# Patient Record
Sex: Male | Born: 1955
Health system: Southern US, Community
[De-identification: ages and names within clinical notes are randomized; demographics above are authoritative.]

## PROBLEM LIST (undated history)

## (undated) DIAGNOSIS — C189 Malignant neoplasm of colon, unspecified: Secondary | ICD-10-CM

## (undated) DIAGNOSIS — F4310 Post-traumatic stress disorder, unspecified: Secondary | ICD-10-CM

## (undated) DIAGNOSIS — K7460 Unspecified cirrhosis of liver: Secondary | ICD-10-CM

## (undated) DIAGNOSIS — I10 Essential (primary) hypertension: Secondary | ICD-10-CM

## (undated) DIAGNOSIS — Z72820 Sleep deprivation: Secondary | ICD-10-CM

## (undated) DIAGNOSIS — M1990 Unspecified osteoarthritis, unspecified site: Secondary | ICD-10-CM

## (undated) DIAGNOSIS — R5383 Other fatigue: Secondary | ICD-10-CM

## (undated) DIAGNOSIS — F1911 Other psychoactive substance abuse, in remission: Secondary | ICD-10-CM

## (undated) DIAGNOSIS — E1142 Type 2 diabetes mellitus with diabetic polyneuropathy: Secondary | ICD-10-CM

## (undated) DIAGNOSIS — K603 Anal fistula, unspecified: Secondary | ICD-10-CM

## (undated) DIAGNOSIS — E119 Type 2 diabetes mellitus without complications: Secondary | ICD-10-CM

## (undated) DIAGNOSIS — R05 Cough: Secondary | ICD-10-CM

## (undated) DIAGNOSIS — K7030 Alcoholic cirrhosis of liver without ascites: Secondary | ICD-10-CM

## (undated) DIAGNOSIS — K612 Anorectal abscess: Secondary | ICD-10-CM

## (undated) HISTORY — DX: Other psychoactive substance abuse, in remission: F19.11

## (undated) HISTORY — DX: Sleep deprivation: Z72.820

## (undated) HISTORY — PX: ABSCESS DRAINAGE: SHX1119

## (undated) HISTORY — DX: Malignant neoplasm of colon, unspecified: C18.9

## (undated) HISTORY — DX: Anorectal abscess: K61.2

## (undated) HISTORY — DX: Other fatigue: R53.83

## (undated) HISTORY — DX: Unspecified cirrhosis of liver: K74.60

## (undated) HISTORY — PX: PERCUTANEOUS PINNING PHALANX FRACTURE OF HAND: SUR1027

## (undated) HISTORY — DX: Anal fistula, unspecified: K60.30

## (undated) HISTORY — DX: Anal fistula: K60.3

---

## 1985-01-07 HISTORY — PX: LUMBAR DISC SURGERY: SHX700

## 1988-09-07 HISTORY — PX: THORACIC DISCECTOMY: SHX100

## 1998-08-30 ENCOUNTER — Ambulatory Visit (HOSPITAL_COMMUNITY): Admission: RE | Admit: 1998-08-30 | Discharge: 1998-08-30 | Payer: Self-pay | Admitting: Cardiology

## 1998-12-13 ENCOUNTER — Ambulatory Visit (HOSPITAL_BASED_OUTPATIENT_CLINIC_OR_DEPARTMENT_OTHER): Admission: RE | Admit: 1998-12-13 | Discharge: 1998-12-13 | Payer: Self-pay | Admitting: General Surgery

## 1998-12-13 ENCOUNTER — Encounter (INDEPENDENT_AMBULATORY_CARE_PROVIDER_SITE_OTHER): Payer: Self-pay | Admitting: Specialist

## 1998-12-13 HISTORY — PX: ANAL FISTULECTOMY: SHX1139

## 1999-01-08 HISTORY — PX: CORONARY ANGIOPLASTY: SHX604

## 2001-06-09 ENCOUNTER — Emergency Department (HOSPITAL_COMMUNITY): Admission: EM | Admit: 2001-06-09 | Discharge: 2001-06-09 | Payer: Self-pay | Admitting: Emergency Medicine

## 2001-07-14 ENCOUNTER — Encounter (INDEPENDENT_AMBULATORY_CARE_PROVIDER_SITE_OTHER): Payer: Self-pay | Admitting: *Deleted

## 2001-07-14 ENCOUNTER — Ambulatory Visit (HOSPITAL_BASED_OUTPATIENT_CLINIC_OR_DEPARTMENT_OTHER): Admission: RE | Admit: 2001-07-14 | Discharge: 2001-07-14 | Payer: Self-pay | Admitting: Surgery

## 2001-07-14 HISTORY — PX: INCISION AND DRAINAGE PERIRECTAL ABSCESS: SHX1804

## 2001-07-22 ENCOUNTER — Encounter: Admission: RE | Admit: 2001-07-22 | Discharge: 2001-07-22 | Payer: Self-pay | Admitting: Internal Medicine

## 2001-07-22 ENCOUNTER — Encounter: Payer: Self-pay | Admitting: Internal Medicine

## 2002-06-22 ENCOUNTER — Encounter: Payer: Self-pay | Admitting: Internal Medicine

## 2002-06-22 ENCOUNTER — Ambulatory Visit (HOSPITAL_COMMUNITY): Admission: RE | Admit: 2002-06-22 | Discharge: 2002-06-22 | Payer: Self-pay | Admitting: Internal Medicine

## 2004-12-13 ENCOUNTER — Inpatient Hospital Stay (HOSPITAL_COMMUNITY): Admission: EM | Admit: 2004-12-13 | Discharge: 2004-12-15 | Payer: Self-pay | Admitting: Emergency Medicine

## 2004-12-13 HISTORY — PX: INCISION AND DRAINAGE PERIRECTAL ABSCESS: SHX1804

## 2005-01-07 HISTORY — PX: ELBOW SURGERY: SHX618

## 2005-03-22 ENCOUNTER — Ambulatory Visit (HOSPITAL_COMMUNITY): Admission: RE | Admit: 2005-03-22 | Discharge: 2005-03-22 | Payer: Self-pay | Admitting: General Surgery

## 2005-03-22 HISTORY — PX: EXAMINATION UNDER ANESTHESIA: SHX1540

## 2005-03-27 ENCOUNTER — Ambulatory Visit: Payer: Self-pay | Admitting: Gastroenterology

## 2005-03-30 ENCOUNTER — Observation Stay (HOSPITAL_COMMUNITY): Admission: EM | Admit: 2005-03-30 | Discharge: 2005-03-31 | Payer: Self-pay | Admitting: Emergency Medicine

## 2005-03-30 HISTORY — PX: INCISION AND DRAINAGE PERIRECTAL ABSCESS: SHX1804

## 2005-04-18 ENCOUNTER — Encounter (INDEPENDENT_AMBULATORY_CARE_PROVIDER_SITE_OTHER): Payer: Self-pay | Admitting: Specialist

## 2005-04-18 ENCOUNTER — Ambulatory Visit: Payer: Self-pay | Admitting: Gastroenterology

## 2005-04-26 ENCOUNTER — Ambulatory Visit (HOSPITAL_BASED_OUTPATIENT_CLINIC_OR_DEPARTMENT_OTHER): Admission: RE | Admit: 2005-04-26 | Discharge: 2005-04-26 | Payer: Self-pay | Admitting: Orthopedic Surgery

## 2005-05-03 ENCOUNTER — Encounter: Admission: RE | Admit: 2005-05-03 | Discharge: 2005-05-03 | Payer: Self-pay | Admitting: General Surgery

## 2005-05-07 DIAGNOSIS — C189 Malignant neoplasm of colon, unspecified: Secondary | ICD-10-CM

## 2005-05-07 HISTORY — DX: Malignant neoplasm of colon, unspecified: C18.9

## 2005-05-17 ENCOUNTER — Encounter: Admission: RE | Admit: 2005-05-17 | Discharge: 2005-05-17 | Payer: Self-pay | Admitting: General Surgery

## 2005-06-05 ENCOUNTER — Encounter (INDEPENDENT_AMBULATORY_CARE_PROVIDER_SITE_OTHER): Payer: Self-pay | Admitting: *Deleted

## 2005-06-05 ENCOUNTER — Inpatient Hospital Stay (HOSPITAL_COMMUNITY): Admission: RE | Admit: 2005-06-05 | Discharge: 2005-06-12 | Payer: Self-pay | Admitting: General Surgery

## 2005-06-05 ENCOUNTER — Encounter: Payer: Self-pay | Admitting: Gastroenterology

## 2005-06-06 ENCOUNTER — Encounter (INDEPENDENT_AMBULATORY_CARE_PROVIDER_SITE_OTHER): Payer: Self-pay | Admitting: *Deleted

## 2005-06-06 ENCOUNTER — Encounter: Payer: Self-pay | Admitting: Gastroenterology

## 2005-06-06 HISTORY — PX: HEMICOLECTOMY: SHX854

## 2005-06-11 ENCOUNTER — Ambulatory Visit: Payer: Self-pay | Admitting: Gastroenterology

## 2005-06-26 ENCOUNTER — Ambulatory Visit: Payer: Self-pay | Admitting: Hematology and Oncology

## 2005-06-29 ENCOUNTER — Emergency Department (HOSPITAL_COMMUNITY): Admission: EM | Admit: 2005-06-29 | Discharge: 2005-06-29 | Payer: Self-pay | Admitting: Emergency Medicine

## 2005-07-09 LAB — CBC WITH DIFFERENTIAL/PLATELET
Basophils Absolute: 0 10*3/uL (ref 0.0–0.1)
Eosinophils Absolute: 0.4 10*3/uL (ref 0.0–0.5)
HGB: 15.1 g/dL (ref 13.0–17.1)
LYMPH%: 28.7 % (ref 14.0–48.0)
MCV: 90.5 fL (ref 81.6–98.0)
MONO%: 8 % (ref 0.0–13.0)
NEUT#: 4.7 10*3/uL (ref 1.5–6.5)
NEUT%: 57.8 % (ref 40.0–75.0)
Platelets: 237 10*3/uL (ref 145–400)

## 2005-07-09 LAB — COMPREHENSIVE METABOLIC PANEL
Alkaline Phosphatase: 64 U/L (ref 39–117)
BUN: 12 mg/dL (ref 6–23)
Glucose, Bld: 106 mg/dL — ABNORMAL HIGH (ref 70–99)
Total Bilirubin: 0.4 mg/dL (ref 0.3–1.2)

## 2005-07-12 ENCOUNTER — Ambulatory Visit (HOSPITAL_COMMUNITY): Admission: RE | Admit: 2005-07-12 | Discharge: 2005-07-12 | Payer: Self-pay | Admitting: Hematology and Oncology

## 2005-11-20 ENCOUNTER — Ambulatory Visit: Payer: Self-pay | Admitting: Hematology and Oncology

## 2005-11-22 LAB — CBC WITH DIFFERENTIAL/PLATELET
Eosinophils Absolute: 0.2 10*3/uL (ref 0.0–0.5)
HCT: 42.7 % (ref 38.7–49.9)
LYMPH%: 30.2 % (ref 14.0–48.0)
MCV: 94.3 fL (ref 81.6–98.0)
MONO%: 7.9 % (ref 0.0–13.0)
NEUT#: 4.7 10*3/uL (ref 1.5–6.5)
NEUT%: 59.1 % (ref 40.0–75.0)
Platelets: 208 10*3/uL (ref 145–400)
RBC: 4.53 10*6/uL (ref 4.20–5.71)

## 2005-11-22 LAB — COMPREHENSIVE METABOLIC PANEL
ALT: 28 U/L (ref 0–53)
BUN: 10 mg/dL (ref 6–23)
CO2: 28 mEq/L (ref 19–32)
Calcium: 9.3 mg/dL (ref 8.4–10.5)
Chloride: 100 mEq/L (ref 96–112)
Creatinine, Ser: 0.82 mg/dL (ref 0.40–1.50)
Glucose, Bld: 144 mg/dL — ABNORMAL HIGH (ref 70–99)
Total Bilirubin: 0.5 mg/dL (ref 0.3–1.2)

## 2005-11-22 LAB — CEA: CEA: 1.1 ng/mL (ref 0.0–5.0)

## 2006-03-18 ENCOUNTER — Ambulatory Visit: Payer: Self-pay | Admitting: Hematology and Oncology

## 2006-03-21 LAB — COMPREHENSIVE METABOLIC PANEL
CO2: 27 mEq/L (ref 19–32)
Calcium: 9.5 mg/dL (ref 8.4–10.5)
Chloride: 102 mEq/L (ref 96–112)
Glucose, Bld: 139 mg/dL — ABNORMAL HIGH (ref 70–99)
Sodium: 138 mEq/L (ref 135–145)
Total Bilirubin: 0.6 mg/dL (ref 0.3–1.2)
Total Protein: 7.8 g/dL (ref 6.0–8.3)

## 2006-03-21 LAB — CBC WITH DIFFERENTIAL/PLATELET
Eosinophils Absolute: 0.3 10*3/uL (ref 0.0–0.5)
HCT: 42.9 % (ref 38.7–49.9)
LYMPH%: 35.5 % (ref 14.0–48.0)
MONO#: 0.6 10*3/uL (ref 0.1–0.9)
NEUT#: 4.3 10*3/uL (ref 1.5–6.5)
NEUT%: 53.7 % (ref 40.0–75.0)
Platelets: 206 10*3/uL (ref 145–400)
RBC: 4.61 10*6/uL (ref 4.20–5.71)
WBC: 8.1 10*3/uL (ref 4.0–10.0)
lymph#: 2.9 10*3/uL (ref 0.9–3.3)

## 2006-03-21 LAB — CEA: CEA: 1.4 ng/mL (ref 0.0–5.0)

## 2006-05-02 ENCOUNTER — Ambulatory Visit: Payer: Self-pay | Admitting: Gastroenterology

## 2006-05-14 ENCOUNTER — Ambulatory Visit: Payer: Self-pay | Admitting: Gastroenterology

## 2006-05-14 ENCOUNTER — Encounter: Payer: Self-pay | Admitting: Gastroenterology

## 2006-07-16 ENCOUNTER — Ambulatory Visit: Payer: Self-pay | Admitting: Hematology and Oncology

## 2006-07-18 ENCOUNTER — Ambulatory Visit (HOSPITAL_COMMUNITY): Admission: RE | Admit: 2006-07-18 | Discharge: 2006-07-18 | Payer: Self-pay | Admitting: Hematology and Oncology

## 2006-07-18 LAB — CBC WITH DIFFERENTIAL/PLATELET
Basophils Absolute: 0.1 10*3/uL (ref 0.0–0.1)
Eosinophils Absolute: 0.3 10*3/uL (ref 0.0–0.5)
HCT: 42.8 % (ref 38.7–49.9)
HGB: 15.2 g/dL (ref 13.0–17.1)
MCH: 33.2 pg (ref 28.0–33.4)
NEUT#: 4.8 10*3/uL (ref 1.5–6.5)
NEUT%: 54.8 % (ref 40.0–75.0)
RDW: 13.5 % (ref 11.2–14.6)
lymph#: 3 10*3/uL (ref 0.9–3.3)

## 2006-07-18 LAB — COMPREHENSIVE METABOLIC PANEL
AST: 57 U/L — ABNORMAL HIGH (ref 0–37)
Albumin: 4.1 g/dL (ref 3.5–5.2)
BUN: 9 mg/dL (ref 6–23)
CO2: 32 mEq/L (ref 19–32)
Calcium: 10.1 mg/dL (ref 8.4–10.5)
Chloride: 99 mEq/L (ref 96–112)
Creatinine, Ser: 0.87 mg/dL (ref 0.40–1.50)
Glucose, Bld: 145 mg/dL — ABNORMAL HIGH (ref 70–99)
Potassium: 4.1 mEq/L (ref 3.5–5.3)

## 2006-07-18 LAB — CEA: CEA: 1.7 ng/mL (ref 0.0–5.0)

## 2006-12-10 ENCOUNTER — Ambulatory Visit: Payer: Self-pay | Admitting: Hematology and Oncology

## 2007-01-21 ENCOUNTER — Ambulatory Visit: Payer: Self-pay | Admitting: Hematology and Oncology

## 2007-02-10 ENCOUNTER — Emergency Department (HOSPITAL_COMMUNITY): Admission: EM | Admit: 2007-02-10 | Discharge: 2007-02-10 | Payer: Self-pay | Admitting: Emergency Medicine

## 2008-01-08 HISTORY — PX: ANTERIOR FUSION CERVICAL SPINE: SUR626

## 2008-01-08 HISTORY — PX: OTHER SURGICAL HISTORY: SHX169

## 2008-01-14 ENCOUNTER — Encounter: Admission: RE | Admit: 2008-01-14 | Discharge: 2008-01-14 | Payer: Self-pay | Admitting: Internal Medicine

## 2008-02-01 ENCOUNTER — Encounter: Admission: RE | Admit: 2008-02-01 | Discharge: 2008-02-01 | Payer: Self-pay | Admitting: Neurosurgery

## 2008-02-18 ENCOUNTER — Ambulatory Visit (HOSPITAL_COMMUNITY): Admission: RE | Admit: 2008-02-18 | Discharge: 2008-02-19 | Payer: Self-pay | Admitting: Neurosurgery

## 2008-10-17 ENCOUNTER — Ambulatory Visit: Payer: Self-pay | Admitting: Critical Care Medicine

## 2008-10-17 ENCOUNTER — Inpatient Hospital Stay (HOSPITAL_COMMUNITY): Admission: RE | Admit: 2008-10-17 | Discharge: 2008-10-21 | Payer: Self-pay | Admitting: Neurosurgery

## 2008-11-27 ENCOUNTER — Encounter: Admission: RE | Admit: 2008-11-27 | Discharge: 2008-11-27 | Payer: Self-pay | Admitting: Neurosurgery

## 2008-12-10 ENCOUNTER — Encounter: Admission: RE | Admit: 2008-12-10 | Discharge: 2008-12-10 | Payer: Self-pay | Admitting: Neurosurgery

## 2009-02-24 ENCOUNTER — Telehealth (INDEPENDENT_AMBULATORY_CARE_PROVIDER_SITE_OTHER): Payer: Self-pay | Admitting: *Deleted

## 2009-04-01 ENCOUNTER — Emergency Department (HOSPITAL_COMMUNITY): Admission: EM | Admit: 2009-04-01 | Discharge: 2009-04-01 | Payer: Self-pay | Admitting: Emergency Medicine

## 2009-04-19 ENCOUNTER — Encounter: Admission: RE | Admit: 2009-04-19 | Discharge: 2009-05-19 | Payer: Self-pay | Admitting: Diagnostic Neuroimaging

## 2009-05-15 ENCOUNTER — Encounter (INDEPENDENT_AMBULATORY_CARE_PROVIDER_SITE_OTHER): Payer: Self-pay | Admitting: *Deleted

## 2009-08-15 ENCOUNTER — Encounter (INDEPENDENT_AMBULATORY_CARE_PROVIDER_SITE_OTHER): Payer: Self-pay | Admitting: *Deleted

## 2009-09-20 ENCOUNTER — Encounter (INDEPENDENT_AMBULATORY_CARE_PROVIDER_SITE_OTHER): Payer: Self-pay | Admitting: *Deleted

## 2009-10-05 ENCOUNTER — Encounter: Payer: Self-pay | Admitting: Gastroenterology

## 2009-10-23 ENCOUNTER — Telehealth: Payer: Self-pay | Admitting: Gastroenterology

## 2009-11-09 ENCOUNTER — Encounter (INDEPENDENT_AMBULATORY_CARE_PROVIDER_SITE_OTHER): Payer: Self-pay | Admitting: *Deleted

## 2009-11-14 ENCOUNTER — Encounter (INDEPENDENT_AMBULATORY_CARE_PROVIDER_SITE_OTHER): Payer: Self-pay | Admitting: *Deleted

## 2009-11-14 ENCOUNTER — Ambulatory Visit: Payer: Self-pay | Admitting: Gastroenterology

## 2009-11-28 ENCOUNTER — Ambulatory Visit: Payer: Self-pay | Admitting: Gastroenterology

## 2009-12-04 ENCOUNTER — Encounter: Payer: Self-pay | Admitting: Gastroenterology

## 2010-02-08 NOTE — Letter (Signed)
Summary: Patient Notice- Polyp Results  [facility]  [address] [place], [place] [address]   Phone: [phone]  Fax: [phone]        December 04, 2009 MRN: [id number]    [facility] [address] [place],   [address]    Dear Matthew Lewis,  I am pleased to inform you that the colon polyp(s) removed during your recent colonoscopy was (were) found to be benign (no cancer detected) upon pathologic examination.  I recommend you have a repeat colonoscopy examination in 3_ years to look for recurrent polyps, as having colon polyps increases your risk for having recurrent polyps or even colon cancer in the future.  Should you develop new or worsening symptoms of abdominal pain, bowel habit changes or bleeding from the rectum or bowels, please schedule an evaluation with either your primary care physician or with me.  Additional information/recommendations:  __ No further action with gastroenterology is needed at this time. Please      follow-up with your primary care physician for your other healthcare      needs.  __ Please call [phone] to schedule a return visit to review your      situation.  __ Please keep your follow-up visit as already scheduled.  _x_ Continue treatment plan as outlined the day of your exam.  Please call us if you are having persistent problems or have questions about your condition that have not been fully answered at this time.  Sincerely,  Inda Castle MD  This letter has been electronically signed by your physician.  Appended Document: Patient Notice- Polyp Results Letter mailed

## 2010-02-08 NOTE — Procedures (Signed)
Summary: Colonoscopy/[facility]  Colonoscopy/[facility]   Imported By: Sherian Rein 11/29/2009 08:03:03  _____________________________________________________________________  External Attachment:    Type:   Image     Comment:   External Document

## 2010-02-08 NOTE — Letter (Signed)
Summary: Moviprep Instructions  [facility]  [address]   [place], [place] [address]   Phone: [phone]  Fax: [phone]       Matthew Lewis    1955-01-16    MRN: [id number]        Procedure Day Sudie Grumbling: Tuesday, 11-28-09     Arrival Time: 9:00 a.m.     Procedure Time: 10:00 a.m.     Location of Procedure:                    x   [facility] (4th Floor)   [place]   Starting 5 days prior to your procedure 11-23-09 do not eat nuts, seeds, popcorn, corn, beans, peas,  salads, or any raw vegetables.  Do not take any fiber supplements (e.g. Metamucil, Citrucel, and Benefiber).  THE DAY BEFORE YOUR PROCEDURE         DATE: 11-27-09  DAY: Monday  1.  Drink clear liquids the entire day-NO SOLID FOOD  2.  Do not drink anything colored red or purple.  Avoid juices with pulp.  No orange juice.  3.  Drink at least 64 oz. (8 glasses) of fluid/clear liquids during the day to prevent dehydration and help the prep work efficiently.  CLEAR LIQUIDS INCLUDE: Water Jello Ice Popsicles Tea (sugar ok, no milk/cream) Powdered fruit flavored drinks Coffee (sugar ok, no milk/cream) Gatorade Juice: apple, white grape, white cranberry  Lemonade Clear bullion, consomm, broth Carbonated beverages (any kind) Strained chicken noodle soup Hard Candy                             4.  In the morning, mix first dose of MoviPrep solution:    Empty 1 Pouch A and 1 Pouch B into the disposable container    Add lukewarm drinking water to the top line of the container. Mix to dissolve    Refrigerate (mixed solution should be used within 24 hrs)  5.  Begin drinking the prep at 5:00 p.m. The MoviPrep container is divided by 4 marks.   Every 15 minutes drink the solution down to the next mark (approximately 8 oz) until the full liter is complete.   6.  Follow completed prep with 16 oz of clear liquid of your choice (Nothing red or  purple).  Continue to drink clear liquids until bedtime.  7.  Before going to bed, mix second dose of MoviPrep solution:    Empty 1 Pouch A and 1 Pouch B into the disposable container    Add lukewarm drinking water to the top line of the container. Mix to dissolve    Refrigerate  THE DAY OF YOUR PROCEDURE      DATE: 11-28-09  DAY: Tuesday  Beginning at 5:00 a.m. (5 hours before procedure):         1. Every 15 minutes, drink the solution down to the next mark (approx 8 oz) until the full liter is complete.  2. Follow completed prep with 16 oz. of clear liquid of your choice.    3. You may drink clear liquids until  8:00 a.m.  (2 HOURS BEFORE PROCEDURE).   MEDICATION INSTRUCTIONS  Unless otherwise instructed, you should take regular prescription medications with a small sip of water   as early as possible the morning of your procedure.  Diabetic patients - see separate instructions.  OTHER INSTRUCTIONS  You will need a responsible adult at least 57 years of age to accompany you and drive you home.   This person must remain in the waiting room during your procedure.  Wear loose fitting clothing that is easily removed.  Leave jewelry and other valuables at home.  However, you may wish to bring a book to read or  an iPod/MP3 player to listen to music as you wait for your procedure to start.  Remove all body piercing jewelry and leave at home.  Total time from sign-in until discharge is approximately 2-3 hours.  You should go home directly after your procedure and rest.  You can resume normal activities the  day after your procedure.  The day of your procedure you should not:   Drive   Make legal decisions   Operate machinery   Drink alcohol   Return to work  You will receive specific instructions about eating, activities and medications before you leave.    The above instructions have been reviewed and explained to me by   Ulice Dash RN   November 14, 2009 4:31 PM     I fully understand and can verbalize these instructions _____________________________ Date _________

## 2010-02-08 NOTE — Procedures (Signed)
Summary: Colonoscopy  Patient: Matthew Lewis Note: All result statuses are Final unless otherwise noted.  Tests: (1) Colonoscopy (COL)   COL Colonoscopy           [address]     [place], [place]  [address]           COLONOSCOPY PROCEDURE REPORT           PATIENT:  Matthew, Lewis  MR#:  [id number]     BIRTHDATE:  02/02/1955, 54 yrs. old  GENDER:  male           ENDOSCOPIST:  Sandy Salaam. Deatra Ina, MD     Referred by:           PROCEDURE DATE:  11/28/2009     PROCEDURE:  Colonoscopy with snare polypectomy     ASA CLASS:  Class II     INDICATIONS:  1) screening  2) history of colon cancer  3) history     of pre-cancerous (adenomatous) colon polyps Colon Ca 2007     Colon polyp 2008           MEDICATIONS:   Fentanyl 100 mcg IV, Versed 9 mg IV, Benadryl 50 mg     IV           DESCRIPTION OF PROCEDURE:   After the risks benefits and     alternatives of the procedure were thoroughly explained, informed     consent was obtained.  Digital rectal exam was performed and     revealed no abnormalities.   The LB CF-[id number] endoscope     was introduced through the anus and advanced to the anastomosis,     without limitations.  The quality of the prep was Moviprep fair.     The instrument was then slowly withdrawn as the colon was fully     examined.     <<PROCEDUREIMAGES>>           FINDINGS:  A sessile polyp was found in the descending colon. It     was 3 mm in size. Polyp was snared without cautery. Retrieval was     successful (see image3). snare polyp  A sessile polyp was found in     the sigmoid colon. It was 3 mm in size. It was found 18 cm from     the point of entry. Polyp was snared without cautery. Retrieval     was successful (see image7). snare polyp  There were multiple     polyps identified and removed. in the sigmoid colon (see image8).     Multiple 1-83m hyperplastic appearing polyps in sigmoid  This was     otherwise a normal  examination of the colon (see image2, image1,     image4, image5, image6, and image9).   Retroflexed views in the     rectum revealed no abnormalities.    The time to cecum =  minutes.     The scope was then withdrawn (time =  9.0  min) from the patient     and the procedure completed.           COMPLICATIONS:  None           ENDOSCOPIC IMPRESSION:     1) 3 mm sessile polyp in the descending colon     2) 3 mm sessile polyp in the sigmoid colon     3) Polyps, multiple hyperplastic in  the sigmoid colon     4) Otherwise normal examination     RECOMMENDATIONS:     1) Colonoscopy           REPEAT EXAM:   3 year(s) Colonoscopy           ______________________________     Sandy Salaam. Deatra Ina, MD           CC: Sophronia Simas, MD, [name], MD           n.     eSIGNED:   Sandy Salaam. Kaplan at 11/28/2009 11:09 AM           Sudie Grumbling, [id number]  Note: An exclamation mark (!) indicates a result that was not dispersed into the flowsheet. Document Creation Date: 11/28/2009 11:09 AM _______________________________________________________________________  (1) Order result status: Final Collection or observation date-time: 11/28/2009 11:01 Requested date-time:  Receipt date-time:  Reported date-time:  Referring Physician:   Ordering Physician: Erskine Emery [phone]) Specimen Source:  Source: Tawanna Cooler Order Number: [id number] Lab site:   Appended Document: Colonoscopy     Procedures Next Due Date:    Colonoscopy: 11/2012

## 2010-02-08 NOTE — Procedures (Signed)
Summary: Colonoscopy/[place]  Colonoscopy/[facility]   Imported By: Phillis Knack 11/29/2009 08:01:26  _____________________________________________________________________  External Attachment:    Type:   Image     Comment:   External Document

## 2010-02-08 NOTE — Letter (Signed)
Summary: Pre Visit Letter Revised  [facility]  [place], [place] [address]   Phone: [phone]  Fax: [phone]        10/05/2009 MRN: [id number] [facility] [address] [place], [place]  [address]             Procedure Date:  11-22 at [place] to the Gastroenterology Division at [facility].    You are scheduled to see a nurse for your pre-procedure visit on 11-14-09 at  4pm on the 3rd floor at [facility], [place] [employer].  We ask that you try to arrive at our office 15 minutes prior to your appointment time to allow for check-in.  Please take a minute to review the attached form.  If you answer "Yes" to one or more of the questions on the first page, we ask that you call the person listed at your earliest opportunity.  If you answer "No" to all of the questions, please complete the rest of the form and bring it to your appointment.    Your nurse visit will consist of discussing your medical and surgical history, your immediate family medical history, and your medications.   If you are unable to list all of your medications on the form, please bring the medication bottles to your appointment and we will list them.  We will need to be aware of both prescribed and over the counter drugs.  We will need to know exact dosage information as well.    Please be prepared to read and sign documents such as consent forms, a financial agreement, and acknowledgement forms.  If necessary, and with your consent, a friend or relative is welcome to sit-in on the nurse visit with you.  Please bring your insurance card so that we may make a copy of it.  If your insurance requires a referral to see a specialist, please bring your referral form from your primary care physician.  No co-pay is required for this nurse visit.     If you cannot keep your appointment, please call [phone] to cancel or reschedule prior to your appointment date.  This  allows [place] the opportunity to schedule an appointment for another patient in need of care.    Thank you for choosing [facility] for your medical needs.  We appreciate the opportunity to care for you.  Please visit [place] at our website  to learn more about our practice.  Sincerely, The Gastroenterology Division

## 2010-02-08 NOTE — Procedures (Signed)
Summary: Colonoscopy/[facility]  Colonoscopy/[facility]   Imported By: Phillis Knack 11/29/2009 07:57:42  _____________________________________________________________________  External Attachment:    Type:   Image     Comment:   External Document

## 2010-02-08 NOTE — Letter (Signed)
Summary: Diabetic Instructions  [facility]  [address] [place], [place] [address]   Phone: [phone]  Fax: [phone]    LEAMAN ABE 1955/05/11 MRN: [id number]   (Glimepiride and Metformin)  ORAL DIABETIC MEDICATION INSTRUCTIONS  The day before your procedure:   Take your diabetic pill as you do normally  The day of your procedure:   Do not take your diabetic pill    We will check your blood sugar levels during the admission process and again in Recovery before discharging you home  ________________________________________________________________________

## 2010-02-08 NOTE — Progress Notes (Signed)
Summary: prep letter ?'s  Phone Note Call from Patient Call back at Home Phone [phone]   Caller: wife Call For: Dr. Arlyce Dice Reason for Call: Talk to Nurse Summary of Call: prep letter ?'s Initial call taken by: Vallarie Mare,  October 23, 2009 1:47 PM  Follow-up for Phone Call        Dr Arlyce Dice, Pt's wife called..She recieved a Health History Form from [place], His direct procedure is scheduled for 11/28/2009. On question #2 on th form it asked if the pt has been having any seroius health problems. Pts wife stated he had neck surgery last October and has been having severe complication ever since. He is being seen at [facility] and has no feeling in his right arm. He is on pain medications..Pt wants to be sure this will not interfer with his procedure and wanted you to be aware.  Follow-up by: Merri Ray CMA Duncan Dull),  October 23, 2009 2:16 PM  Additional Follow-up for Phone Call Additional follow up Details #1::        ok Additional Follow-up by: Louis Meckel MD,  October 23, 2009 4:14 PM     Appended Document: prep letter ?'s Called pt to inform ok for procedure

## 2010-02-08 NOTE — Letter (Signed)
Summary: Colonoscopy Letter  [facility]  [address] [place], [place] [address]   Phone: [phone]  Fax: [phone]      May 15, 2009 MRN: [id number]   [facility] [address] [place], [place]  [address]   Dear Mr. Mccardle,   According to your medical record, it is time for you to schedule a Colonoscopy. The American Cancer Society recommends this procedure as a method to detect early colon cancer. Patients with a family history of colon cancer, or a personal history of colon polyps or inflammatory bowel disease are at increased risk.  This letter has beeen generated based on the recommendations made at the time of your procedure. If you feel that in your particular situation this may no longer apply, please contact our office.  Please call our office at [phone] to schedule this appointment or to update your records at your earliest convenience.  Thank you for cooperating with [place] to provide you with the very best care possible.   Sincerely,  Barbette Hair. Arlyce Dice, M.D.  [facility]astroenterology Division [phone]

## 2010-02-08 NOTE — Letter (Signed)
Summary: Previsit letter  [facility]  [address] [place], [place] [address]   Phone: [phone]  Fax: [phone]       08/15/2009 MRN: [id number]  [facility] [address] [place], [place]  [address]  Dear Matthew Lewis,  Welcome to the Gastroenterology Division at [facility].    You are scheduled to see a nurse for your pre-procedure visit on 09-20-09 at 4:30p.m. on the 3rd floor at [facility], [address]  We ask that you try to arrive at our office 15 minutes prior to your appointment time to allow for check-in.  Your nurse visit will consist of discussing your medical and surgical history, your immediate family medical history, and your medications.    Please bring a complete list of all your medications or, if you prefer, bring the medication bottles and we will list them.  We will need to be aware of both prescribed and over the counter drugs.  We will need to know exact dosage information as well.  If you are on blood thinners (Coumadin, Plavix, Aggrenox, Ticlid, etc.) please call our office today/prior to your appointment, as we need to consult with your physician about holding your medication.   Please be prepared to read and sign documents such as consent forms, a financial agreement, and acknowledgement forms.  If necessary, and with your consent, a friend or relative is welcome to sit-in on the nurse visit with you.  Please bring your insurance card so that we may make a copy of it.  If your insurance requires a referral to see a specialist, please bring your referral form from your primary care physician.  No co-pay is required for this nurse visit.     If you cannot keep your appointment, please call [phone] to cancel or reschedule prior to your appointment date.  This allows [place] the opportunity to schedule an appointment for another patient in need of care.    Thank you for choosing [facility] for your medical  needs.  We appreciate the opportunity to care for you.  Please visit [place] at our website  to learn more about our practice.                     Sincerely.                                                                                                                   The Gastroenterology Division

## 2010-02-08 NOTE — Miscellaneous (Signed)
Summary: [facility]  Clinical Lists Changes  Medications: Added new medication of MOVIPREP 100 GM  SOLR (PEG-KCL-NACL-NASULF-NA ASC-C) As per prep instructions. - Signed Rx of MOVIPREP 100 GM  SOLR (PEG-KCL-NACL-NASULF-NA ASC-C) As per prep instructions.;  #1 x 0;  Signed;  Entered by: Ulice Dash RN;  Authorized by: Inda Castle MD;  Method used: Electronically to [facility] [id number]*, [address], [place], [place]  [address], Ph: [phone], Fax: [phone] Observations: Added new observation of NKA: T (11/14/2009 15:49)    Prescriptions: MOVIPREP 100 GM  SOLR (PEG-KCL-NACL-NASULF-NA ASC-C) As per prep instructions.  #1 x 0   Entered by:   Ulice Dash RN   Authorized by:   Inda Castle MD   Signed by:   Ulice Dash RN on 11/14/2009   Method used:   Electronically to        [facility] [phone]* (retail)       [address] [place], [place]  [address]       Ph: [phone]       Fax: [phone]   RxID:   [id number]

## 2010-02-08 NOTE — Letter (Signed)
Summary: Pre Visit No Show Letter  [facility]  [address] [place], [place] [address]   Phone: [phone]  Fax: [phone]        September 20, 2009 MRN: [id number]    [facility] [address] [place], [place]  [address]    Dear Matthew Lewis,   We have been unable to reach you by phone concerning the pre-procedure visit that you missed on 09/20/09. For this reason,your procedure scheduled on 10/04/09 has been cancelled. Our scheduling staff will gladly assist you with rescheduling your appointments at a more convenient time. Please call our office at [phone] between the hours of 8:00am and 5:00pm, press option #2 to reach an appointment scheduler. Please consider updating your contact numbers at this time so that we can reach you by phone in the future with schedule changes or results.    Thank you,    Wyona Almas RN [facility]

## 2010-02-08 NOTE — Progress Notes (Signed)
Summary: Records request  Records request received fromDDS via the fax machine. Forwarded to [employer] for processing.

## 2010-03-20 LAB — GLUCOSE, CAPILLARY
Glucose-Capillary: 134 mg/dL — ABNORMAL HIGH (ref 70–99)
Glucose-Capillary: 62 mg/dL — ABNORMAL LOW (ref 70–99)

## 2010-04-01 LAB — COMPREHENSIVE METABOLIC PANEL
Albumin: 4.1 g/dL (ref 3.5–5.2)
BUN: 10 mg/dL (ref 6–23)
Creatinine, Ser: 0.84 mg/dL (ref 0.4–1.5)
Total Bilirubin: 1 mg/dL (ref 0.3–1.2)
Total Protein: 7.6 g/dL (ref 6.0–8.3)

## 2010-04-01 LAB — DIFFERENTIAL
Lymphs Abs: 2.1 10*3/uL (ref 0.7–4.0)
Monocytes Absolute: 1 10*3/uL (ref 0.1–1.0)
Monocytes Relative: 8 % (ref 3–12)
Neutro Abs: 8.3 10*3/uL — ABNORMAL HIGH (ref 1.7–7.7)
Neutrophils Relative %: 71 % (ref 43–77)

## 2010-04-01 LAB — CBC
HCT: 44.3 % (ref 39.0–52.0)
MCHC: 34.7 g/dL (ref 30.0–36.0)
MCV: 91.7 fL (ref 78.0–100.0)
Platelets: 216 10*3/uL (ref 150–400)
RDW: 12.9 % (ref 11.5–15.5)

## 2010-04-01 LAB — URINALYSIS, ROUTINE W REFLEX MICROSCOPIC
Bilirubin Urine: NEGATIVE
Hgb urine dipstick: NEGATIVE
Protein, ur: NEGATIVE mg/dL
Urobilinogen, UA: 1 mg/dL (ref 0.0–1.0)

## 2010-04-01 LAB — GLUCOSE, CAPILLARY: Glucose-Capillary: 173 mg/dL — ABNORMAL HIGH (ref 70–99)

## 2010-04-12 LAB — COMPREHENSIVE METABOLIC PANEL
Albumin: 3 g/dL — ABNORMAL LOW (ref 3.5–5.2)
BUN: 9 mg/dL (ref 6–23)
Calcium: 8.5 mg/dL (ref 8.4–10.5)
Creatinine, Ser: 0.69 mg/dL (ref 0.4–1.5)
Total Protein: 6.4 g/dL (ref 6.0–8.3)

## 2010-04-12 LAB — GLUCOSE, CAPILLARY
Glucose-Capillary: 114 mg/dL — ABNORMAL HIGH (ref 70–99)
Glucose-Capillary: 119 mg/dL — ABNORMAL HIGH (ref 70–99)
Glucose-Capillary: 136 mg/dL — ABNORMAL HIGH (ref 70–99)
Glucose-Capillary: 136 mg/dL — ABNORMAL HIGH (ref 70–99)
Glucose-Capillary: 138 mg/dL — ABNORMAL HIGH (ref 70–99)
Glucose-Capillary: 141 mg/dL — ABNORMAL HIGH (ref 70–99)
Glucose-Capillary: 142 mg/dL — ABNORMAL HIGH (ref 70–99)
Glucose-Capillary: 161 mg/dL — ABNORMAL HIGH (ref 70–99)
Glucose-Capillary: 165 mg/dL — ABNORMAL HIGH (ref 70–99)
Glucose-Capillary: 175 mg/dL — ABNORMAL HIGH (ref 70–99)
Glucose-Capillary: 214 mg/dL — ABNORMAL HIGH (ref 70–99)
Glucose-Capillary: 223 mg/dL — ABNORMAL HIGH (ref 70–99)
Glucose-Capillary: 242 mg/dL — ABNORMAL HIGH (ref 70–99)

## 2010-04-12 LAB — BASIC METABOLIC PANEL
BUN: 7 mg/dL (ref 6–23)
CO2: 29 mEq/L (ref 19–32)
Calcium: 8.3 mg/dL — ABNORMAL LOW (ref 8.4–10.5)
Chloride: 101 mEq/L (ref 96–112)
Chloride: 102 mEq/L (ref 96–112)
Creatinine, Ser: 0.59 mg/dL (ref 0.4–1.5)
Creatinine, Ser: 0.71 mg/dL (ref 0.4–1.5)
GFR calc Af Amer: >60 mL/min (ref >60)
GFR calc Af Amer: >60 mL/min (ref >60)
GFR calc non Af Amer: >60 mL/min (ref >60)
GFR calc non Af Amer: >60 mL/min (ref >60)
Potassium: 4.3 mEq/L (ref 3.5–5.1)
Sodium: 136 mEq/L (ref 135–145)

## 2010-04-12 LAB — BLOOD GAS, ARTERIAL
Acid-Base Excess: 2.1 mmol/L — ABNORMAL HIGH (ref 0.0–2.0)
FIO2: 0.6 %
MECHVT: 600 mL
O2 Saturation: 98.2 %
PEEP: 5 cmH2O
RATE: 12 resp/min
pCO2 arterial: 41.6 mmHg (ref 35.0–45.0)

## 2010-04-12 LAB — CBC
HCT: 30.1 % — ABNORMAL LOW (ref 39.0–52.0)
HCT: 33.3 % — ABNORMAL LOW (ref 39.0–52.0)
HCT: 43.6 % (ref 39.0–52.0)
Hemoglobin: 15.3 g/dL (ref 13.0–17.0)
MCHC: 35.2 g/dL (ref 30.0–36.0)
MCV: 95.8 fL (ref 78.0–100.0)
MCV: 96.1 fL (ref 78.0–100.0)
MCV: 96.2 fL (ref 78.0–100.0)
Platelets: 131 10*3/uL — ABNORMAL LOW (ref 150–400)
Platelets: 163 10*3/uL (ref 150–400)
RBC: 3 MIL/uL — ABNORMAL LOW (ref 4.22–5.81)
RBC: 4.54 MIL/uL (ref 4.22–5.81)
RDW: 13.2 % (ref 11.5–15.5)
WBC: 7.3 10*3/uL (ref 4.0–10.5)
WBC: 8.6 10*3/uL (ref 4.0–10.5)
WBC: 9.5 10*3/uL (ref 4.0–10.5)

## 2010-04-12 LAB — CORTISOL: Cortisol, Plasma: 19.9 ug/dL

## 2010-04-12 LAB — CARDIAC PANEL(CRET KIN+CKTOT+MB+TROPI): Troponin I: 0.01 ng/mL (ref 0.00–0.06)

## 2010-04-12 LAB — MRSA PCR SCREENING: MRSA by PCR: NEGATIVE

## 2010-04-12 LAB — PROTIME-INR: INR: 1.13 (ref 0.00–1.49)

## 2010-04-12 LAB — APTT: aPTT: 30 seconds (ref 24–37)

## 2010-04-24 LAB — CBC
Hemoglobin: 15.1 g/dL (ref 13.0–17.0)
MCHC: 35.4 g/dL (ref 30.0–36.0)
MCV: 93.5 fL (ref 78.0–100.0)
RBC: 4.57 MIL/uL (ref 4.22–5.81)
WBC: 7.9 10*3/uL (ref 4.0–10.5)

## 2010-04-24 LAB — BASIC METABOLIC PANEL
CO2: 25 mEq/L (ref 19–32)
Calcium: 9 mg/dL (ref 8.4–10.5)
Chloride: 98 mEq/L (ref 96–112)
GFR calc Af Amer: >60 mL/min (ref >60)
Sodium: 131 mEq/L — ABNORMAL LOW (ref 135–145)

## 2010-04-24 LAB — GLUCOSE, CAPILLARY
Glucose-Capillary: 114 mg/dL — ABNORMAL HIGH (ref 70–99)
Glucose-Capillary: 96 mg/dL (ref 70–99)

## 2010-05-22 NOTE — Op Note (Signed)
NAMETHORNTON, DOHRMANN              ACCOUNT NO.:  [id number]   MEDICAL RECORD NO.:  [id number]          PATIENT TYPE:  OIB   LOCATION:  0932                         FACILITY:  [facility]   PHYSICIAN:  Ophelia Charter, M.D.DATE OF BIRTH:  11-04-1955   DATE OF PROCEDURE:  02/18/2008  DATE OF DISCHARGE:                               OPERATIVE REPORT   BRIEF HISTORY:  The patient is a 55 year old white male who has suffered  from back and left leg pain consistent with an L2 radiculopathy.  He  failed medical management.  Workup of the lumbar MRI, which demonstrated  the patient had multilevel disk degeneration, but the most striking  finding of an enlarged disk herniation at L1-L2 on the left.  I  discussed the various treatment options with the patient including  surgery.  He has weighed the risks, benefits, and alternative of  surgery, and decided proceed with a L1-L2 diskectomy.   PREOPERATIVE DIAGNOSES:  L1-L2 herniated nucleus pulposus, spinal  stenosis, lumbar radiculopathy, lumbago.   POSTOPERATIVE DIAGNOSES:  L1-L2 herniated nucleus pulposus, spinal  stenosis, lumbar radiculopathy, lumbago.   PROCEDURE:  Left L1-L2 diskectomy using microdissection.   SURGEON:  Ophelia Charter, MD   ASSISTANT:  Otilio Connors, MD   ANESTHESIA:  General endotracheal.   ESTIMATED BLOOD LOSS:  50 mL.   SPECIMENS:  None.   DRAINS:  None.   COMPLICATIONS:  None.   DESCRIPTION OF PROCEDURE:  The patient was brought to the operating room  by anesthesia team.  General endotracheal anesthesia was induced.  The  patient was turned to the prone position on the Wilson frame.  His  lumbosacral region was then prepared with Betadine scrub and Betadine  solution.  Prior to making incision, we obtained intraoperative  radiograph to localize the area to be incised.  We then incised, used a  15 blade scalpel to make a linear midline incision over the L1-L2  interspace.  We used electrocautery to  perform a left-sided dissection,  exposing the spinous process and lamina of L1 and L2.  We obtained  intraoperative radiograph to confirm our location.  We then inserted the  [facility] retractor for exposure.  We then brought the operative  microscope into the field and under its magnification and illumination,  we completed the microdissection/decompression.  We used high-speed  drill to perform a left L1 laminotomy.  We widened laminotomy with  Kerrison punch, removing the L1-L2 ligamentum flavum.  We then used  microdissection to free up the thecal sac from the epidural tissue.  Dr.  Luiz Ochoa then gently retracted the thecal sac very gently medially with  the nerve root retractor.  This exposed a large free fragment disk  herniation.  We removed it in multiple fragments using the  micropituitary forceps.  There is a fairly large disk herniation.  We  palpated along the ventral surface of thecal sac and removed it in  multiple fragments with the pituitary forceps.  We then inspected the L1-  L2 intervertebral disk.  There did not appear to be any large holes in  annulus nor any  impending herniations.  We therefore did not enter into  the intervertebral disk space.  We then obtained hemostasis using  bipolar cautery.  We irrigated the wound out with bacitracin solution.  We then removed the retractor and reapproximated the patient's  thoracolumbar fascia with interrupted #1 Vicryl suture, the subcutaneous  tissue with interrupted 2-0 Vicryl suture, and the skin with Steri-  Strips and Benzoin.  The wound was then coated with bacitracin ointment.  A sterile dressing was applied.  The drapes were removed.  The patient  was subsequently returned to the supine position where he was extubated  by the anesthesia team and transported to [facility] in  stable condition.  All sponge, instrument, and needle counts were  correct at the end of this case.      Ophelia Charter,  M.D.  Electronically Signed     JDJ/MEDQ  D:  02/18/2008  T:  02/19/2008  Job:  [id number]

## 2010-05-25 NOTE — Op Note (Signed)
[place]  Patient:    Matthew Lewis, Matthew Lewis Visit Number: [id number] MRN: [id number]          Service Type: DSU Location: [facility] Attending Physician:  Harl Bowie Dictated by:   Coralie Keens, M.D. Proc. Date: 07/14/01 Admit Date:  07/14/2001                             Operative Report  PREOPERATIVE DIAGNOSIS:  Chronic perianal abscesses.  POSTOPERATIVE DIAGNOSIS:  Chronic perianal abscesses.  OPERATION PERFORMED:  Examination under anesthesia and excision of chronic abscess tissue.  SURGEON:  Coralie Keens, M.D.  ANESTHESIA:  General endotracheal anesthesia and 0.5% Marcaine with epinephrine.  ESTIMATED BLOOD LOSS:  Minimal.  INDICATIONS FOR PROCEDURE:  The patient is a 55 year old gentleman who is morbidly obese and diabetic who has had multiple recurrent perianal abscesses. In the year 2000 he underwent examination under anesthesia and a fistulotomy for a perianal fistula.  Since then he has had recurrent abscesses along the tract that had been closed.  DESCRIPTION OF PROCEDURE:  Patient brought to operating room and identified as Sudie Grumbling.  He was placed supine on the operating table and general anesthesia was induced.  The patient was then placed in a prone position.  His perineum was then prepped and draped in the usual sterile fashion.  Again, the chronic abscess area was located in the right perianal area.  There was still an opening from his previous incision and drainage.  A fistula probe was inserted through this but no internal opening could be found.  The [facility] retractor was placed in the anal cavity and again, the anal cavity appeared normal circumferentially.  At this point decision was made to perform a wide excision of the chronically infected area.  Elliptical incision was made with a #15 blade and then the incision was carried down into the perianal tissue with the electrocautery.  The resection  was taken through healthy tissue circumferentially.  The removed tissue was quite hardened and chronically inflamed.  At this point the wound was then irrigated with normal saline.  The area around the wound in the perianal area was then anesthetized with 0.5% Marcaine with epinephrine.  Hemostasis was then achieved with cautery.  Two 2-0 chromic sutures were placed in the deep tissue to help pull the wound together.  The superficial aspect of the wound was then left open and packed with a wet-to-dry saline gauze.  The patient tolerated the procedure well.  All sponge, needle and instrument counts were correct at the end of the procedure.  The patient was then placed back in supine position, extubated in the operating room and taken in stable condition to the recovery room. Dictated by:   Coralie Keens, M.D. Attending Physician:  Harl Bowie DD:  07/14/01 TD:  07/14/01 Job: [id number]

## 2010-05-25 NOTE — Op Note (Signed)
NAMEHAMDI, Matthew Lewis              ACCOUNT NO.:  [id number]   MEDICAL RECORD NO.:  [id number]          PATIENT TYPE:  INP   LOCATION:  2550                         FACILITY:  [facility]   PHYSICIAN:  Edsel Petrin. Dalbert Batman, M.D.DATE OF BIRTH:  Jul 12, 1955   DATE OF PROCEDURE:  12/13/2004  DATE OF DISCHARGE:                                 OPERATIVE REPORT   PREOPERATIVE DIAGNOSIS:  Recurrent perirectal abscess.   POSTOPERATIVE DIAGNOSIS:  Recurrent perirectal abscess.   OPERATION PERFORMED:  Examination under anesthesia, anoscopy, incision and  drainage of perirectal abscess.   SURGEON:  Fanny Skates, M.D.   OPERATIVE INDICATIONS:  This is a 55 year old white man who presented with a  1-week history of worsening perirectal pain and swelling on the right side  and poor appetite.  He thought it was hemorrhoids.  He has had 2 previous  surgeries for perirectal abscess and fistula; the last was several years by  Dr. Coralie Keens.  On exam, he has a large tender indurated area on the  right perirectal area.  A CT scan was done which showed a deep perirectal  abscess, but no primary disease of the rectum.  He is brought to the  operating room for drainage.   OPERATIVE TECHNIQUE:  Following the induction of general endotracheal  anesthesia, the patient was placed in the dorsal lithotomy position.  The  perianal area was prepped and draped in a sterile fashion.  I passed an 18-  gauge spinal needle through the rectum and entered the abscess cavity in the  right posterior position; this was sent for aerobic and anaerobic cultures.  I made a radially oriented incision in the right posterior position and  drained an abscess which extended up to the right anterior position and also  across the midline posteriorly.  I therefore made a radially oriented  incision right anterior and radially oriented incision left posterior to  adequately drain the entire abscess cavity.  I felt that I broke up all  the  loculations.  I placed a Penrose drain around the horseshoe tract between  right posterior and left posterior and secured these with safety pins.  I  debrided some skin so that the skin would stay open on the right posterior  incision.  The wounds were irrigated with saline.  Hemostasis was excellent.  Bulky absorbent bandages were placed and fishnet panties were placed.  The  patient tolerated the procedure well and was taken to the recovery room in  stable condition.  Estimated blood loss was about 75 mL. Complications --  none.  Sponge and instrument counts were correct.      Edsel Petrin. Dalbert Batman, M.D.  Electronically Signed     HMI/MEDQ  D:  12/13/2004  T:  12/14/2004  Job:  [id number]

## 2010-05-25 NOTE — Op Note (Signed)
Matthew Lewis, Matthew Lewis              ACCOUNT NO.:  [id number]   MEDICAL RECORD NO.:  [id number]          PATIENT TYPE:  AMB   LOCATION:  [place]                          FACILITY:  [facility]   PHYSICIAN:  Youlanda Mighty. Sypher, M.D. DATE OF BIRTH:  October 14, 1955   DATE OF PROCEDURE:  04/26/2005  DATE OF DISCHARGE:                                 OPERATIVE REPORT   PREOPERATIVE DIAGNOSIS:  McGowan stage II ulnar neuropathy left elbow with  positive electrodiagnostics studies documenting 38 meters per second  conduction velocity across the left elbow with background diabetes.   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   OPERATION:  Neurolysis of left ulnar nerve at cubital tunnel with resection  of anomalous medial head of triceps.   SURGEON:  Youlanda Mighty. Sypher, M.D.   ASSISTANT:  Marily Lente. Dasnoit, P.A.-C.   ANESTHESIA:  General by LMA.   SUPERVISING ANESTHESIOLOGIST:  Nelda Severe. Tobias Alexander, M.D.   INDICATIONS:  Matthew Lewis is a 55 year old gentleman referred to the  courtesy of Dr. Seleta Rhymes for evaluation of numbness and weakness of the  left hand. Clinical examination suggested ulnar neuropathy the left cubital  tunnel. Matthew Lewis had background diabetes mellitus and coronary artery  disease. He was recently diagnosed to have colon cancer.  He was referred to  see Dr. Edwyna Shell for detailed electrodiagnostics studies which confirmed  significant left ulnar neuropathy across the elbow with a conduction  velocity of 38 meters per second.  We recommended decompression in situ of  the left ulnar nerve.  After informed consent, he is brought to the  operating room at this time.   PROCEDURE:  Matthew Lewis is brought to the operating room and placed in  supine position on the table.  Following the induction general anesthesia by  LMA technique, the left arm was prepped with Betadine soap solution and  sterilely  draped. A pneumatic tourniquet was applied to the proximal  brachium.  Following exsanguination of the left arm with an Esmarch bandage,  the arterial tourniquet was inflated to 240 mmHg due to mild systolic  hypertension.   The procedure commenced with a short incision directly over the path of the  ulnar nerve posterior to the medial epicondyle.  The subcutaneous tissues  were carefully divided revealing an extremely fibrotic olecranon bursa that  extended anteromedially above the medial condyle.  This was meticulously  dissected away from the deep fascia and retracted posteriorly. There was a  very large fat pad between the bursa and the Osborne's ligament which  normally secures the ulnar nerve at the cubital tunnel.  The medial end of  the triceps was quite unusual in that there was a very broad medial  accessory muscle belly medial and anterior to the main triceps tendon and  once this was dissected posteriorly to reveal the nerve, there was a second  discrete muscle belly directly onto the nerve that was causing the ulnar  nerve to roll out of the cubital tunnel with elbow flexion beyond 80  degrees.  The medial  portion of the triceps was resected anterior and medial  to the nerve and the anomalous muscle the size of one small finger deep to  the nerve was also resected down to the normal appearing triceps tendon.  The cubital tunnel was quite shallow.  While there was still some slight  residual instability of the nerve in my judgment, given the amount of soft  tissue padding in the region of the medial condyle, it appeared appropriate  to leave the nerve in situ.  There was no disturbance of the epineurial  vascular leashes.   After the fascia of the head of the flexor carpi ulnaris was released and  fascial bands deep to the muscle were released, the medial brachial fascia  was likewise released proximally.  This fully decompressed the nerve.  After  addressing the anomalous  muscles, we are able to range the elbow from 0 to  140 degrees with a stable nerve.  Bleeding points were electrocauterized  with bipolar current followed by repair of the skin with subcutaneous  sutures of 4-0 Vicryl and intradermal 3-0 Prolene segmental sutures.   We will encourage Matthew Lewis to begin immediate range of motion exercises  to the elbow and to avoid getting his elbow wet.  He was placed in a  compressive dressing of sterile gauze and a Tegaderm dressing followed by  Ace wrap.  He may change the Ace wrap was office necessary at home.  He will  return for follow-up in one week.  For aftercare, he is provided a  prescription for Percocet 5 mg, 1-2 tablets p.o. q.4-6h. p.r.n. pain, 20  tablets without refill.      Youlanda Mighty Sypher, M.D.  Electronically Signed     RVS/MEDQ  D:  04/26/2005  T:  04/27/2005  Job:  [id number]   cc:   Theodoro Parma. Francina Ames., M.D.  Fax: [phone]   Kathrin Penner, M.D.  [address] [place]   [id number]

## 2010-05-25 NOTE — Discharge Summary (Signed)
NAMEKEILYN, Matthew Lewis              ACCOUNT NO.:  [id number]   MEDICAL RECORD NO.:  [id number]          PATIENT TYPE:  INP   LOCATION:  3435                         FACILITY:  [facility]   PHYSICIAN:  Kathrin Penner, M.D.   DATE OF BIRTH:  26-Aug-1955   DATE OF ADMISSION:  06/05/2005  DATE OF DISCHARGE:  06/12/2005                                 DISCHARGE SUMMARY   ADMISSION DIAGNOSES:  1.  Carcinoma of descending colon.  2.  Diabetes mellitus.   DISCHARGE DIAGNOSES:  1.  Carcinoma of descending colon.  2.  Diabetes mellitus.  3.  Malnutrition.   PROCEDURES IN HOSPITAL:  1.  Left hemicolectomy with primary colocolic anastomosis.  2.  Treatment of malnutrition with total parenteral nutrition.  3.  Control and monitoring of diabetes mellitus.   COMPLICATIONS:  None.   CONDITION ON DISCHARGE:  Improved.   FOLLOW UP:  In 2 weeks in the office.   DISCHARGE MEDICATIONS:  1.  Percocet 1-2 every 4 hours p.r.n. for pain.  2.  Resumption of his diabetic medication: Glucophage 500 mg twice daily.   ACTIVITY:  As tolerated except the patient is to refrain from lifting any  weights heavier than 15 pounds for the next 6 weeks.   HOSPITAL COURSE:  [name] is a 55 year old man who on colonoscopy is  noted to have a lesion in the descending colon measuring approximately 3 cm  in greatest diameter.  This was removed colonoscopically but did, in fact,  reveal adenocarcinoma that as invasive.  The patient was admitted after  following bowel prep and taken to the operating room where he underwent left-  sided hemicolectomy.  The pathology report showed:  1.  No residual adenocarcinoma.  2.  Twelve lymph nodes were negative for metastatic carcinoma.   There were no hepatic or periaortic lymph nodes noted either at surgery or  on CT scanning.   The patient did undergo the above-noted procedure.  During the course of his  postoperative stay, his prealbumin did fall to approximately 8.  He  was  started on TPN, and this continued through his hospital stay.  At the time  of  discharge, his prealbumin is now 10.  He is tolerating a regular diet  without difficulty.  He is having normal bowel movements.  The incision is  healing satisfactorily.   Followup will be, as noted above, in two weeks.      Kathrin Penner, M.D.  Electronically Signed     PB/MEDQ  D:  06/12/2005  T:  06/12/2005  Job:  [id number]   cc:   Sandy Salaam. Deatra Ina, M.D. [facility]  [address]  [place] [address]

## 2010-05-25 NOTE — H&P (Signed)
Matthew Lewis, Matthew Lewis              ACCOUNT NO.:  [id number]   MEDICAL RECORD NO.:  [id number]          PATIENT TYPE:  INP   LOCATION:  [place]                         FACILITY:  [facility]   PHYSICIAN:  Imogene Burn. Georgette Dover, M.D. DATE OF BIRTH:  1955-04-07   DATE OF ADMISSION:  12/13/2004  DATE OF DISCHARGE:                                HISTORY & PHYSICAL   CHIEF COMPLAINT:  Perirectal pain.   HISTORY OF PRESENT ILLNESS:  The patient is a 56 year old male with a  history of recurrent perirectal abscesses who presents with one-week history  of worsening perirectal pain and swelling.  The patient thought he had a  hemorrhoid and has been treating with Preparation H.  This did not help.  The patient reports subjective feelings of fever, poor appetite, and just  overall not feeling well.  The patient has had two previous surgeries for  perianal fistulas and abscesses.  The most recent one was in 2003 by Dr.  Nedra Hai.   ALLERGIES:  The patient and his wife are unsure but they think the  medication is NIACET.  They do not know what the reaction was.   MEDICATIONS:  Ibuprofen p.r.n.   PAST MEDICAL HISTORY:  Coronary artery disease.  The patient apparently had  a cardiac catheterization which showed some type of partial blockage which  was angioplastied.  We do not have any records of this.  Patient said he was  seen by Dr. Glade Lloyd, but has not seen him in a couple of years.   PAST SURGICAL HISTORY:  1.  Angioplasty.  2.  Back surgery.  3.  Perirectal abscess drainage.   SOCIAL HISTORY:  Nonsmoker, nondrinker.   White count 15.6, hemoglobin 12.7.  Electrolytes are within normal limits.  His creatinine is 1.  His glucose is 269.  Tbili is 1.3.   PHYSICAL EXAMINATION:  VITAL SIGNS:  Temperature 97.3, blood pressure  152/94, pulse 125, respirations 30.  GENERAL:  This is an overweight white male who appears uncomfortable.  He is  lying on his side.  ABDOMEN:  Soft and nontender.  RECTAL:  Examination of the perianal region shows a very large, tender,  erythematous, indurated area of the right buttock measuring approximately 15  inches across.  The patient is too tender to permit a thorough rectal  examination.   IMPRESSION:  Recurrent perirectal abscess.   PLAN:  Due to the large size of this abscess we will obtain a pelvis CT scan  with contrast to determine the extent of the abscess.  We will also start  the patient on intravenous antibiotics and pain management.  He will need  surgical incision and debridement of this perirectal abscess.      Imogene Burn. Tsuei, M.D.  Electronically Signed     MKT/MEDQ  D:  12/13/2004  T:  12/13/2004  Job:  [id number]

## 2010-05-25 NOTE — Discharge Summary (Signed)
[name], Matthew Lewis              ACCOUNT NO.:  [id number]   MEDICAL RECORD NO.:  [id number]          PATIENT TYPE:  INP   LOCATION:  5715                         FACILITY:  [facility]   PHYSICIAN:  Edsel Petrin. Dalbert Batman, M.D.DATE OF BIRTH:  06/15/1955   DATE OF ADMISSION:  12/13/2004  DATE OF DISCHARGE:  12/15/2004                                 DISCHARGE SUMMARY   FINAL DIAGNOSES:  1.  Recurrent perirectal abscess.  2.  Coronary artery disease.  3.  Hyperglycemia.  4.  Obesity.   HISTORY:  This is a 55 year old, white man, who presented to the emergency  room with a 1 week history of worsening perirectal pain and swelling.  Subjectively he thought he had fever. He was treating this with Preparation  H. He has had 2 previous surgeries for perianal fistulas and abscesses.   EXAM:  Overweight, white man, who appeared uncomfortable, but alert and  cooperative. Temp 97.3, heart rate 125, blood pressure 152/94. There was a  right sided perirectal abscess, which was fairly large and indurated and  tender, measuring about 15 cm.   HOSPITAL COURSE:  The patient was evaluated and admitted by Dr. Rainer Mesa. A CT scan was obtained, which showed a large localized perirectal  abscess, no apparent intraabdominal pathology. Dr. Georgette Dover asked me to manage  the patient the following morning, and he was taken to the operating room on  December 13, 2004. I drained a horseshoe perirectal abscess in the right  interior, right posterior and left posterior positions, and he tolerated  that well. He was started on wound care and sitz baths, maintained on  intravenous antibiotics.   He was ready to be discharged on December 15, 2004. At that time he was  comfortable and wanted to go home. His wounds were clean. Arrangements were  made for him to follow-up in the office with me and also to follow-up with  Dr. Seleta Rhymes, his primary care physician.      Edsel Petrin. Dalbert Batman, M.D.  Electronically  Signed     HMI/MEDQ  D:  03/07/2005  T:  03/07/2005  Job:  [id number]   cc:   Iran Sizer, MD  Fax: [phone]   Theodoro Parma. Francina Ames., M.D.  Fax: [phone]

## 2010-05-25 NOTE — Op Note (Signed)
NAMECHRISOPHER, Lewis              ACCOUNT NO.:  [id number]   MEDICAL RECORD NO.:  [id number]          PATIENT TYPE:  AMB   LOCATION:  [facility]                          FACILITY:  [facility]   PHYSICIAN:  Kathrin Penner, M.D.   DATE OF BIRTH:  Jul 24, 1955   DATE OF PROCEDURE:  03/22/2005  DATE OF DISCHARGE:  03/22/2005                                 OPERATIVE REPORT   PREOPERATIVE DIAGNOSIS:  Fistula in ano.   POSTOPERATIVE DIAGNOSES:  1.  Healed fistula in ano.  2.  Grade 3 hemorrhoidal disease.  3.  Polyp at 11 cm.   PROCEDURES:  1.  Examination under anesthesia.  2.  Proctosigmoidoscopy to 25 cm.   OPERATIVE FINDINGS:  1.  Completely healed anal fistula without external pore or internal pore      noted.  2.  Grade 3 hemorrhoidal disease with both external and internal      hemorrhoids.  3.  A 2.5 cm polyp at 11 cm from the anal verge.   NOTE:  Matthew Lewis is a 55 year old man presenting with anal pain and  drainage.  His history is that he has undergone multiple operations for  incision and drainage of perirectal and perianal abscesses in the past.  On  presentation he was noted to have a fistula presenting at the left perianal  area inferior to the equatorial plane, which traveled up on probing for  approximately 4.5 cm.  The patient now comes to the operating room for a  fistulotomy with a possible insertion of seton suture.  He understands the  risks and potential benefits of surgery and gives his consent.   Following the induction of satisfactory general anesthesia, the patient is  positioned in the lithotomy position.  On examination of the perianal area,  the region of the fistula was completely closed and there was no external  opening.  On anoscopy he was noted to have no internal opening at any place  around the dentate line.  He was also noted to have very large internal and  external hemorrhoids with some hemorrhoidal complexes primarily in the 5  o'clock, 8  o'clock and 10:10 o'clock position.  I went ahead and did a  proctosigmoidoscopy on the patient, passing the proctosigmoidoscope up to 25  cm.  At 11 cm from the anal verge there was a large polyp noted measuring  approximately 2.5 cm in size.  There was a smaller polyp at approximately 10  cm measuring approximately 0.5 cm in size.  There were no additional polyps  or other abnormalities seen up to 25 cm.  The proctoscope was removed, the  anesthetic reversed, and the patient removed from the operating room to the  recovery room in stable condition.  He tolerated this procedure well.   Please note, as an addendum, I did not make any attempt to remove the polyp  as equipment for polypectomy was not available and the patient did not give  [place] consent to do any polypectomies.  It should also be noted that the  patient will require full colonoscopy and polypectomy should be done  at that  time.      Kathrin Penner, M.D.  Electronically Signed     PB/MEDQ  D:  03/22/2005  T:  03/24/2005  Job:  [id number]

## 2010-05-25 NOTE — Op Note (Signed)
NAMEJUVON, Matthew Lewis              ACCOUNT NO.:  [id number]   MEDICAL RECORD NO.:  [id number]          PATIENT TYPE:  INP   LOCATION:  6468                         FACILITY:  [facility]   PHYSICIAN:  Kathrin Penner, M.D.   DATE OF BIRTH:  1955-01-12   DATE OF PROCEDURE:  06/06/2005  DATE OF DISCHARGE:                                 OPERATIVE REPORT   PREOPERATIVE DIAGNOSIS:  Carcinoma of descending colon.   POSTOPERATIVE DIAGNOSIS:  Carcinoma of descending colon.   PROCEDURE:  Left hemicolectomy with primary anastomosis.   SURGEON:  Kathrin Penner, M.D.   ASSISTANT:  Dr. Chucky May.   ANESTHESIA:  General.   NOTE:  Matthew Lewis is a 55 year old man who on colonoscopy is noted to have  multiple polyps.  He has a polyp measuring approximately 3 cm in the  descending colon which on biopsy is noted to have colonic adenocarcinoma.  The polyp was removed piecemeal.  The patient returns now for segmental  resection of the colon for adenocarcinoma.  The patient is aware of the  risks and potential benefits of surgery, all of his questions have been  answered and consent obtained for same.   PROCEDURE:  Following induction of satisfactory general anesthesia with the  patient positioned supinely.  The abdomen is prepped and draped to be  included in a sterile operative field.  Exploratory laparotomy is carried  down through midline incision deepened through skin, subcutaneous tissues  down through the linea alba.  The abdomen is opened and entered.  The  peritoneal surfaces were clean without any evidence of metastatic disease.  There were no tumors palpable in the colon; however, on the region of the  ascending colon there was a blue patch of colon which was there  due to a  previous tattoo this lesion.  The liver edges were sharp.  Liver surfaces  smooth.  No evidence of metastasis within the liver.  There was no para-  aortic or abnormal mesenteric adenopathy palpated.   The entire left  colon from approximately the midsigmoid all the way up along  the retroperitoneal resection of Toldt and takedown of the splenic flexure  was carried out.  The entire left colon was mobilized from the middle colic  vessels and distally carrying dissection over Gerota's fascia and down to  the sigmoid.  The colon was transected proximally just distal to the middle  colic vessels and then distally at sigmoid point.  Large mesenteric fat was  taken between clamps and secured with ties of 2-0.  The large mesenteric  pedicle of the left colic vessels were taken and doubly tied.  A functional  end-to-end anastomosis was then carried out using a GIA stapler and TA 60  stapling device.  The resulting anastomosis was noted to be widely patent.  The mesenteric defect was closed with interrupted sutures of 2-0  and 3-0  silk.  All areas of dissection were then checked for hemostasis and noted to  be dry.  Sponge and instrument counts were verified.  The abdominal cavity  thoroughly irrigated with multiple aliquots of normal saline  and the abdomen  closed in layers as follows.  The midline was closed with running double-  stranded  #1 PDS suture.  The subcutaneous tissues were irrigated and skin was closed  with staples.  Sterile dressings were applied after the second sponge count  was verified.  The patient then removed from the operating room to the  recovery room in stable condition.  He tolerated the procedure well.      Kathrin Penner, M.D.  Electronically Signed     PB/MEDQ  D:  06/06/2005  T:  06/06/2005  Job:  [id number]   cc:   Sandy Salaam. Deatra Ina, M.D. [facility]  [address]  [place] [address]

## 2010-05-25 NOTE — Op Note (Signed)
Matthew Lewis, Matthew Lewis              ACCOUNT NO.:  [id number]   MEDICAL RECORD NO.:  [id number]          PATIENT TYPE:  INP   LOCATION:  7654                         FACILITY:  [facility]   PHYSICIAN:  Kathrin Penner, M.D.   DATE OF BIRTH:  1955/11/10   DATE OF PROCEDURE:  03/30/2005  DATE OF DISCHARGE:  03/31/2005                                 OPERATIVE REPORT   PREOPERATIVE DIAGNOSIS:  Recurrent perirectal abscess.   POSTOPERATIVE DIAGNOSIS:  Recurrent perirectal abscess.   PROCEDURE:  Incision and drainage, perirectal abscess.   SURGEON:  Kathrin Penner, M.D.   ASSISTANT:  Nurse.   ANESTHESIA:  General.   NOTE:  The patient is a 55 year old man who has had previous perirectal  abscess was drained in the past.  He did have a fistula which closed  spontaneously.  He presents again with a painful perianal area with  associated fever and leukocytosis.  On examination he has a very large  perirectal abscess and comes to the operating room for drainage of same.   PROCEDURE:  Following induction of satisfactory general anesthesia, the  patient is positioned supinely and then placed in lithotomy position.  The  perianal tissues retractor prepped and draped to be included in a sterile  operative field.  I palpated the abscess and made a radial incision from the  edge of the external sphincter muscles and deepened through skin,  subcutaneous tissue carrying the dissection down into a rather large abscess  cavity.  All the loculations in the abscess cavity were broken up and the  abscess cultured where found large amounts of pus.  Cavity was then  irrigated with multiple aliquots of normal saline. I used a 2-inch vaginal  packing to pack the wound in its entirety.  A sterile dressing was then  placed on the perineum.  The anesthetic reversed and the patient removed  from the operating room to the recovery room in stable condition.  He  tolerated the procedure well.      Kathrin Penner, M.D.  Electronically Signed     PB/MEDQ  D:  03/30/2005  T:  04/02/2005  Job:  [id number]

## 2010-05-25 NOTE — Consult Note (Signed)
[place]  Patient:    Matthew Lewis, Matthew Lewis Visit Number: [id number] MRN: [id number]          Service Type: EMS Location: [facility] Attending Physician:  Lajean Saver Dictated by:   Coralie Keens, M.D. Admit Date:  06/09/2001                            Consultation Report  Patient is a 55 year old gentleman.  REASON FOR CONSULTATION:  Perianal abscess.  CHIEF COMPLAINT:  Matthew Lewis is a 55 year old gentleman who has had a perianal fistula in the past.  He is status post examination under anesthesia and fistulotomy by Dr. Elesa Hacker in 2000.  He now presents with a perianal abscess.  The patient reports he has had these from time to time and has needed incision and drainage before.  This last one popped up several days ago and spontaneously drained prior coming to the ER.  He is not a diabetic.  PHYSICAL EXAMINATION  RECTAL:  He indeed has a chronic appearing perineum with inflammation and erythema where an opening is present on the right side that is draining purulent debris.  At this point the area was prepped and draped, anesthetized with 1% lidocaine with epinephrine.  The incision was made larger and probed with a hemostat. No further purulence was identified.  The wound was then packed.  IMPRESSION:  Patient with chronic perianal abscess.  At this point we will place him on Augmentin 875 mg p.o. b.i.d. for 10 days.  He will do sitz baths b.i.d.  I have also placed him on Tylox for pain.  He will follow up in my office next week. Dictated by:   Coralie Keens, M.D. Attending Physician:  Lajean Saver DD:  06/09/01 TD:  06/10/01 Job: [id number]

## 2010-05-25 NOTE — Op Note (Signed)
. [facility]  Patient:    Matthew Lewis                        MRN: [id number] Proc. Date: 12/13/98 Adm. Date:  [id number] Attending:  Westly Pam CC:         Theodoro Parma. Francina Ames., M.D.                           Operative Report  PREOPERATIVE DIAGNOSIS:  Fistula in ano.  POSTOPERATIVE DIAGNOSIS:  Fistula in ano.  PROCEDURE:  Anal fistulectomy and procto.  SURGEON:  Maia Plan. Lindon Romp, M.D.  ASSISTANT:  None.  ANESTHESIA:  General anesthesia.  DESCRIPTION OF PROCEDURE:  With the patient under good general anesthesia in the dorsal lithotomy position, the skin of the perineum were prepped and draped in he usual fashion.  Proctoscopy was performed to 15 cm and was negative.  The patient had a secondary opening and the right anterior quadrant of the anal  verge approximately two inches from the anal verge.  A probe was easily passed nto a crypt in the radial direction.  The entire cavity was unroofed and then the sides were excised to make the wound flat.  Hemostasis was obtained with two running sutures catching anoderm and the base of the wound.  Electrocautery was also used. Both of these sutures were started at the proximal end of the incision and run p toward the outside.  Hemostasis was good.  The wound was dressed with Dibucaine  ointment and 4 x 4s.  Estimated blood loss was minimal.  The patient received no blood and left the operating room in satisfactory condition after sponge and needle counts were verified. DD:  12/13/98 TD:  12/14/98 Job: [id number]

## 2010-08-11 ENCOUNTER — Inpatient Hospital Stay (HOSPITAL_COMMUNITY)
Admission: EM | Admit: 2010-08-11 | Discharge: 2010-08-14 | DRG: 157 | Disposition: A | Discharge status: Home Health | Payer: BC Managed Care – PPO | Attending: General Surgery | Admitting: General Surgery

## 2010-08-11 ENCOUNTER — Emergency Department (HOSPITAL_COMMUNITY): Payer: BC Managed Care – PPO

## 2010-08-11 DIAGNOSIS — Z7982 Long term (current) use of aspirin: Secondary | ICD-10-CM

## 2010-08-11 DIAGNOSIS — K612 Anorectal abscess: Principal | ICD-10-CM | POA: Diagnosis present

## 2010-08-11 DIAGNOSIS — Z981 Arthrodesis status: Secondary | ICD-10-CM

## 2010-08-11 DIAGNOSIS — M1990 Unspecified osteoarthritis, unspecified site: Secondary | ICD-10-CM | POA: Diagnosis present

## 2010-08-11 DIAGNOSIS — E119 Type 2 diabetes mellitus without complications: Secondary | ICD-10-CM | POA: Diagnosis present

## 2010-08-11 DIAGNOSIS — Z85038 Personal history of other malignant neoplasm of large intestine: Secondary | ICD-10-CM

## 2010-08-11 DIAGNOSIS — N390 Urinary tract infection, site not specified: Secondary | ICD-10-CM | POA: Diagnosis present

## 2010-08-11 LAB — URINALYSIS, ROUTINE W REFLEX MICROSCOPIC
Glucose, UA: NEGATIVE mg/dL
Hgb urine dipstick: NEGATIVE
Protein, ur: NEGATIVE mg/dL

## 2010-08-11 LAB — URINE MICROSCOPIC-ADD ON

## 2010-08-11 LAB — DIFFERENTIAL
Basophils Relative: 0 % (ref 0–1)
Eosinophils Absolute: 0.1 10*3/uL (ref 0.0–0.7)
Eosinophils Relative: 0 % (ref 0–5)
Monocytes Absolute: 1.7 10*3/uL — ABNORMAL HIGH (ref 0.1–1.0)
Monocytes Relative: 12 % (ref 3–12)

## 2010-08-11 LAB — CBC
MCH: 32.8 pg (ref 26.0–34.0)
MCHC: 36.7 g/dL — ABNORMAL HIGH (ref 30.0–36.0)
MCV: 89.4 fL (ref 78.0–100.0)
Platelets: 205 10*3/uL (ref 150–400)

## 2010-08-11 LAB — LACTIC ACID, PLASMA: Lactic Acid, Venous: 1.5 mmol/L (ref 0.5–2.2)

## 2010-08-11 LAB — COMPREHENSIVE METABOLIC PANEL
AST: 22 U/L (ref 0–37)
Albumin: 3.6 g/dL (ref 3.5–5.2)
Chloride: 99 mEq/L (ref 96–112)
Creatinine, Ser: 0.62 mg/dL (ref 0.50–1.35)
Potassium: 3.9 mEq/L (ref 3.5–5.1)
Total Bilirubin: 1.2 mg/dL (ref 0.3–1.2)

## 2010-08-11 LAB — GLUCOSE, CAPILLARY: Glucose-Capillary: 117 mg/dL — ABNORMAL HIGH (ref 70–99)

## 2010-08-11 MED ORDER — IOHEXOL 300 MG/ML  SOLN: 100.0000 mL | Freq: Once | INTRAMUSCULAR | Status: AC | PRN

## 2010-08-12 HISTORY — PX: INCISION AND DRAINAGE PERIRECTAL ABSCESS: SHX1804

## 2010-08-12 LAB — GLUCOSE, CAPILLARY
Glucose-Capillary: 162 mg/dL — ABNORMAL HIGH (ref 70–99)
Glucose-Capillary: 157 mg/dL — ABNORMAL HIGH (ref 70–99)

## 2010-08-12 LAB — CULTURE, ROUTINE-ABSCESS

## 2010-08-14 LAB — CBC
MCH: 31.8 pg (ref 26.0–34.0)
MCHC: 35 g/dL (ref 30.0–36.0)
MCV: 90.8 fL (ref 78.0–100.0)
Platelets: 180 10*3/uL (ref 150–400)

## 2010-08-14 LAB — GLUCOSE, CAPILLARY
Glucose-Capillary: 150 mg/dL — ABNORMAL HIGH (ref 70–99)
Glucose-Capillary: 104 mg/dL — ABNORMAL HIGH (ref 70–99)

## 2010-08-14 NOTE — Op Note (Signed)
NAMEJERARD, BAYS NO.:  [id number]  MEDICAL RECORD NO.:  [id number]  LOCATION:  4081                         FACILITY:  [facility]  PHYSICIAN:  Leighton Ruff. Redmond Pulling, MD     DATE OF BIRTH:  [birth year]  DATE OF PROCEDURE:  08/12/2010 DATE OF DISCHARGE:                              OPERATIVE REPORT   PREOPERATIVE DIAGNOSIS:  Perirectal abscess.  POSTOPERATIVE DIAGNOSIS:  Perirectal horseshoe abscess.  PROCEDURE: 1. Examination under anesthesia. 2. Incision and drainage of perirectal horseshoe abscess.  SURGEON:  Leighton Ruff. Redmond Pulling, MD  ASSISTANT:  None.  ANESTHESIA:  General plus 30 mL of 0.25% Marcaine with epi.  SPECIMEN:  Aerobic and anaerobic cultures of the perirectal abscess.  ESTIMATED BLOOD LOSS:  Minimal.  FINDINGS:  The patient, once we taped his buttocks apart, he started spontaneously draining through an old incision in the right side of his medial buttock.  It opened up into a large cavity that extended up and posteriorly to the contralateral side.  INDICATIONS FOR PROCEDURE:  The patient is a very pleasant 55 year old Caucasian male who has a history of multiple perirectal and perianal abscesses that have been drained over the past several years.  There is no history of inflammatory bowel disease.  The patient has had 6 weeks of perirectal pain.  The pain got to the point where he could not tolerate any more, so he came to the emergency room.  On CT scan of his pelvis, he was found to have what appeared to be a horseshoe abscess, more so on the right than the left.  We discussed the risks and benefits of surgery including bleeding, infection, injury to surrounding structures, fecal incontinence, DVT formation, recurrence, fistula formation, urinary retention, need for additional procedures, and bleeding.  The patient elected to proceed to the operating room.  DESCRIPTION OF PROCEDURE:  The patient was taken to the operating room. General  endotracheal anesthesia was established.  He was then placed in the prone jackknife position with the appropriate padding.  Sequential compression devices had been placed.  He received antibiotics within several hours of skin incision and was on therapeutic dosing.  As I was taping part his buttocks, he started spontaneously draining through an old incision in the right perianal area.  This was in the midportion between the anterior and posterior position.  We continued to prep with Betadine.  We then draped in the usual standard surgical fashion.  A surgical time-out was performed.  I was able to place wound cultures through the old incision and get specimens.  Then using my finger, I advanced it through the already opened skin defect into a large cavity that extended posteriorly up and around.  The cavity extended to the contralateral left side.  I ended up enlarging the right skin defect extending the incision out laterally by another inch.  The wound did track anteriorly towards his perineum, however, there was very little pus that way.  The anoscope was placed within the rectum.  I then did an anoscopy in a circumferential fashion.  There was no evidence of any fluctuance within the anal canal.  There were no signs of fistula.  I could  not see any connection between the anus and the rectum and the surrounding skin.  He had one or two small internal hemorrhoids.  The anoscope was withdrawn.  Because the cavity extended up and posteriorly toward the sacrum to the contralateral side, I made a counter incision on the left side in the left posterolateral position with Bovie electrocautery.  Then using a Kelly, I opened it up, the two cavities connected.  I was able to easily palpate my finger on the other side. At this point, the cavities were irrigated.  Hemostasis was assured.  I then packed the left-sided cavity with half inch iodoform gauze.  The right side since it was a large cavity  I packed it with Kerlix and it was also packed with iodoform gauze in the right which was extending anteriorly towards the perineum.  4x4s, ABDs and mesh underwear were then placed.  The patient was placed in the supine position on the OR stretcher, extubated, and taken to the recovery room in stable addition. All needle, instrument, and sponge counts were correct x2.  There were no immediate complications.  The patient tolerated the procedure well.     Leighton Ruff. Redmond Pulling, MD     EMW/MEDQ  D:  08/12/2010  T:  08/12/2010  Job:  [id number]  Electronically Signed by Greer Pickerel M.D. on 08/14/2010 11:01:51 AM
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
## 2010-08-15 NOTE — Discharge Summary (Signed)
NAMETIBURCIO, LINDER NO.:  [id number]  MEDICAL RECORD NO.:  [id number]  LOCATION:  2878                         FACILITY:  [facility]  PHYSICIAN:  Gwenyth Ober, M.D.    DATE OF BIRTH:  1955/09/07  DATE OF ADMISSION:  08/11/2010 DATE OF DISCHARGE:  08/14/2010                              DISCHARGE SUMMARY   ADMITTING PHYSICIAN:  Sammuel Hines. Daiva Nakayama, MD  DISCHARGING PHYSICIAN:  Gwenyth Ober, MD  CONSULTANTS:  None.  PROCEDURES:  Examination under anesthesia and incision and drainage of perirectal horseshoe abscess by Dr. Greer Pickerel on August 12, 2010.  REASON FOR ADMISSION:  Mr. [place] is a 55 year old male who presented to the emergency department with a 6-week history of rectal pain.  He had no drainage from this area.  He admits to having a past medical history including perirectal abscesses which have required I and Ds in the past.  It did appear the patient had recurrent perirectal abscess and required admission for incision and drainage.  Please see admitting history and physical for further details.  ADMITTING DIAGNOSES: 1. Horseshoe perirectal abscess. 2. Prior history of multiple perirectal abscesses. 3. History of colon cancer. 4. Degenerative joint disease. 5. Diabetes.  HOSPITAL COURSE:  The patient was admitted.  He was placed on IV Cipro, Flagyl.  He was taken to the operating room the following morning where he underwent an incision and drainage.  The patient tolerated this procedure well.  On the first postoperative day, we began dressing changes.  Repeat labs were obtained on postoperative day 2 which revealed that his white blood cell count had decreased to 6800.  His wound was examined on postoperative day 2 and was clean.  There was no purulent drainage, erythema, or induration.  At this time, the patient was felt stable for discharge home.  We did have the patient set up with home health for dressing changes. Because of insurance  purposes, they will only cover every-other-day basis for dressing changes.  Therefore, what we will do is send the patient home with dressing changes and have home health to do a dressing change the first day at home.  The next day, we will have the patient remove his packing himself and continue sitz baths 3-4 times a day for that day.  We will then have home health reevaluate him the next day. If his wounds are clean, then we will have home health tell the patient to discontinue dressing changes and just to continue sitz baths three to four times a day.  However, if his wound is still not completely clean, then we will have home health continue to do dressing changes every other day, and the patient do sitz baths on the days in between.  DISCHARGE DIAGNOSES: 1. Status post incision and drainage of horseshoe perirectal abscess. 2. History of prior perirectal abscesses. 3. History of colon cancer. 4. Degenerative joint disease. 5. Diabetes.  DISCHARGE MEDICATIONS:  The patient may continue his aspirin at home. He is given a new prescription for oxycodone 5-10 mg p.o. q.4 h. p.r.n. pain.  He is given a prescription also for Augmentin 875 mg one p.o. b.i.d. for 5 days.  DISCHARGE INSTRUCTIONS:  The patient may increase his activity slowly. He may shower and bathe.  He is actually encouraged to sitz baths three to four times a day in between his dressing change days.  He has no dietary restrictions except for to resume his normal diabetic diet.  He is to follow up with Dr. Redmond Pulling within the next 1-2 weeks for postoperative visit.  He is to call for worsening pain or fever greater than 101.5.  For wound care instructions, please see last paragraph of hospital course.     Henreitta Cea, PA   ______________________________ Gwenyth Ober, M.D.    KEO/MEDQ  D:  08/14/2010  T:  08/14/2010  Job:  [id number]  cc:   Leighton Ruff. Redmond Pulling, MD  Electronically Signed by Saverio Danker PA on  08/15/2010 10:21:24 AM Electronically Signed by Judeth Horn M.D. on 08/15/2010 03:56:05 PM

## 2010-08-17 LAB — ANAEROBIC CULTURE

## 2010-08-22 ENCOUNTER — Encounter (INDEPENDENT_AMBULATORY_CARE_PROVIDER_SITE_OTHER): Payer: Self-pay | Admitting: General Surgery

## 2010-08-30 ENCOUNTER — Encounter (INDEPENDENT_AMBULATORY_CARE_PROVIDER_SITE_OTHER): Payer: Self-pay | Admitting: General Surgery

## 2010-08-30 ENCOUNTER — Ambulatory Visit (INDEPENDENT_AMBULATORY_CARE_PROVIDER_SITE_OTHER): Payer: BC Managed Care – PPO | Admitting: General Surgery

## 2010-08-30 VITALS — BP 126/92 | HR 56 | Temp 96.5°F | Ht 72.0 in | Wt 228.5 lb

## 2010-08-30 DIAGNOSIS — K611 Rectal abscess: Secondary | ICD-10-CM

## 2010-08-30 DIAGNOSIS — Z09 Encounter for follow-up examination after completed treatment for conditions other than malignant neoplasm: Secondary | ICD-10-CM

## 2010-08-30 DIAGNOSIS — K612 Anorectal abscess: Secondary | ICD-10-CM

## 2010-08-30 NOTE — Patient Instructions (Signed)
GETTING TO GOOD BOWEL HEALTH. Irregular bowel habits such as constipation and diarrhea can lead to many problems over time.  Having one soft bowel movement a day is the most important way to prevent further problems.  The anorectal canal is designed to handle stretching and feces to safely manage our ability to get rid of solid waste (feces, poop, stool) out of our body.  BUT, hard constipated stools can act like ripping concrete bricks and diarrhea can be a burning fire to this very sensitive area of our body, causing inflamed hemorrhoids, anal fissures, increasing risk is perirectal abscesses, abdominal pain/bloating, an making irritable bowel worse.     The goal: ONE SOFT BOWEL MOVEMENT A DAY!  To have soft, regular bowel movements:    Drink at least 8 tall glasses of water a day.     Take plenty of fiber.  Fiber is the undigested part of plant food that passes into the colon, acting s "natures broom" to encourage bowel motility and movement.  Fiber can absorb and hold large amounts of water. This results in a larger, bulkier stool, which is soft and easier to pass. Work gradually over several weeks up to 6 servings a day of fiber (25g a day even more if needed) in the form of: o Vegetables -- Root (potatoes, carrots, turnips), leafy green (lettuce, salad greens, celery, spinach), or cooked high residue (cabbage, broccoli, etc) o Fruit -- Fresh (unpeeled skin & pulp), Dried (prunes, apricots, cherries, etc ),  or stewed ( applesauce)  o Whole grain breads, pasta, etc (whole wheat)  o Bran cereals    Bulking Agents -- This type of water-retaining fiber generally is easily obtained each day by one of the following:  o Psyllium bran -- The psyllium plant is remarkable because its ground seeds can retain so much water. This product is available as Metamucil, Konsyl, Effersyllium, Per Diem Fiber, or the less expensive generic preparation in drug and health food stores. Although labeled a laxative, it really  is not a laxative.  o Methylcellulose -- This is another fiber derived from wood which also retains water. It is available as Citrucel. o Polyethylene Glycol - and "artificial" fiber commonly called Miralax or Glycolax.  It is helpful for people with gassy or bloated feelings with regular fiber o Flax Seed - a less gassy fiber than psyllium   No reading or other relaxing activity while on the toilet. If bowel movements take longer than 5 minutes, you are too constipated   AVOID CONSTIPATION.  High fiber and water intake usually takes care of this.  Sometimes a laxative is needed to stimulate more frequent bowel movements, but    Laxatives are not a good long-term solution as it can wear the colon out. o Osmotics (Milk of Magnesia, Fleets phosphosoda, Magnesium citrate, MiraLax, GoLytely) are safer than  o Stimulants (Senokot, Castor Oil, Dulcolax, Ex Lax)    o Do not take laxatives for more than 7days in a row.    IF SEVERELY CONSTIPATED, try a Bowel Retraining Program: o Do not use laxatives.  o Eat a diet high in roughage, such as bran cereals and leafy vegetables.  o Drink six (6) ounces of prune or apricot juice each morning.  o Eat two (2) large servings of stewed fruit each day.  o Take one (1) heaping tablespoon of a psyllium-based bulking agent twice a day. Use sugar-free sweetener when possible to avoid excessive calories.  o Eat a normal breakfast.  o  Set aside 15 minutes after breakfast to sit on the toilet, but do not strain to have a bowel movement.  o If you do not have a bowel movement by the third day, use an enema and repeat the above steps.    Controlling diarrhea o Switch to liquids and simpler foods for a few days to avoid stressing your intestines further. o Avoid dairy products (especially milk & ice cream) for a short time.  The intestines often can lose the ability to digest lactose when stressed. o Avoid foods that cause gassiness or bloating.  Typical foods include  beans and other legumes, cabbage, broccoli, and dairy foods.  Every person has some sensitivity to other foods, so listen to our body and avoid those foods that trigger problems for you. o Adding fiber (Citrucel, Metamucil, psyllium, Miralax) gradually can help thicken stools by absorbing excess fluid and retrain the intestines to act more normally.  Slowly increase the dose over a few weeks.  Too much fiber too soon can backfire and cause cramping & bloating. o Probiotics (such as active yogurt, Align, etc) may help repopulate the intestines and colon with normal bacteria and calm down a sensitive digestive tract.  Most studies show it to be of mild help, though, and such products can be costly. o Medicines:   Bismuth subsalicylate (ex. Kayopectate, Pepto Bismol) every 30 minutes for up to 6 doses can help control diarrhea.  Avoid if pregnant.   Loperamide (Immodium) can slow down diarrhea.  Start with two tablets (61m total) first and then try one tablet every 6 hours.  Avoid if you are having fevers or severe pain.  If you are not better or start feeling worse, stop all medicines and call your doctor for advice o Call your doctor if you are getting worse or not better.  Sometimes further testing (cultures, endoscopy, X-ray studies, bloodwork, etc) may be needed to help diagnose and treat the cause of the diarrhea.'

## 2010-08-30 NOTE — Progress Notes (Signed)
Chief complaint: Postop  Procedure: Status post exam under anesthesia, incision and drainage of horseshoe perirectal abscess August 12, 2010  History of Present Ilness: 55 year old Caucasian male comes in today first first postoperative appointment. He was hospitalized from August 4 through August 7. He was discharged home with home health services as well as Augmentin and. He has completed his antibiotic course. He denies any fecal incontinence. He will have some occasional serous sanguinous drainage on the gauze that he places in his underwear. He no longer has to pack the incisions. He reports a good appetite. He reports daily bowel movements. He felt as if he had the flu last week. However he feels better this week. He states that he also feels as if he has some numbness around his anus. He denies any rectal pain  Physical Exam: BP 126/92  Pulse 56  Temp 96.5 F (35.8 C)  Ht 6' (1.829 m)  Wt 228 lb 8 oz (103.647 kg)  BMI 30.99 kg/m2  Well-developed well-nourished Caucasian male in no apparent distress Pulmonary-lungs are clear Skin-no jaundice Abdomen-soft, nontender, nondistended Rectal-he has 2 incisions, one is in the right anterior lateral position and the other is in the left posterior lateral position. Each incision is about 2 inches long. There is granulation tissue in each incision. The skin edges are separated by approximately 4 mm. There is no fluctuance or induration. There is no cellulitis. I cannot express any drainage.  Data reviewed: I reviewed his hospital discharge summary. I reviewed my operative note.   Assessment and Plan: Status post exam under anesthesia, incision and drainage of large perirectal horseshoe abscess.  I think he's doing quite well. There is no signs of recurrent abscess. I encouraged him to continue to eat foods that are high in fiber. The wounds no longer require packing.  I will see him in 6 weeks

## 2010-10-09 ENCOUNTER — Other Ambulatory Visit: Payer: Self-pay | Admitting: Internal Medicine

## 2010-10-09 ENCOUNTER — Ambulatory Visit
Admission: RE | Admit: 2010-10-09 | Discharge: 2010-10-09 | Disposition: A | Discharge status: Home / Self Care | Payer: BC Managed Care – PPO | Source: Ambulatory Visit | Attending: Internal Medicine | Admitting: Internal Medicine

## 2010-10-09 DIAGNOSIS — R51 Headache: Secondary | ICD-10-CM

## 2010-10-09 DIAGNOSIS — R110 Nausea: Secondary | ICD-10-CM

## 2010-10-09 DIAGNOSIS — R1110 Vomiting, unspecified: Secondary | ICD-10-CM

## 2010-10-09 DIAGNOSIS — R410 Disorientation, unspecified: Secondary | ICD-10-CM

## 2010-10-12 ENCOUNTER — Encounter (INDEPENDENT_AMBULATORY_CARE_PROVIDER_SITE_OTHER): Payer: BC Managed Care – PPO | Admitting: General Surgery

## 2011-06-05 ENCOUNTER — Encounter (HOSPITAL_COMMUNITY): Payer: Self-pay | Admitting: *Deleted

## 2011-06-05 ENCOUNTER — Encounter (HOSPITAL_COMMUNITY): Payer: Self-pay | Admitting: Anesthesiology

## 2011-06-05 ENCOUNTER — Observation Stay (HOSPITAL_COMMUNITY): Payer: BC Managed Care – PPO

## 2011-06-05 ENCOUNTER — Inpatient Hospital Stay (HOSPITAL_COMMUNITY)
Admission: EM | Admit: 2011-06-05 | Discharge: 2011-06-08 | DRG: 157 | Disposition: A | Discharge status: Home / Self Care | Payer: BC Managed Care – PPO | Attending: General Surgery | Admitting: General Surgery

## 2011-06-05 ENCOUNTER — Telehealth (INDEPENDENT_AMBULATORY_CARE_PROVIDER_SITE_OTHER): Payer: Self-pay | Admitting: General Surgery

## 2011-06-05 DIAGNOSIS — Z85038 Personal history of other malignant neoplasm of large intestine: Secondary | ICD-10-CM

## 2011-06-05 DIAGNOSIS — Z8249 Family history of ischemic heart disease and other diseases of the circulatory system: Secondary | ICD-10-CM

## 2011-06-05 DIAGNOSIS — Z87891 Personal history of nicotine dependence: Secondary | ICD-10-CM

## 2011-06-05 DIAGNOSIS — K611 Rectal abscess: Secondary | ICD-10-CM

## 2011-06-05 DIAGNOSIS — I10 Essential (primary) hypertension: Secondary | ICD-10-CM | POA: Diagnosis present

## 2011-06-05 DIAGNOSIS — K612 Anorectal abscess: Principal | ICD-10-CM | POA: Diagnosis present

## 2011-06-05 DIAGNOSIS — IMO0001 Reserved for inherently not codable concepts without codable children: Secondary | ICD-10-CM | POA: Diagnosis present

## 2011-06-05 HISTORY — DX: Type 2 diabetes mellitus with diabetic polyneuropathy: E11.42

## 2011-06-05 HISTORY — DX: Post-traumatic stress disorder, unspecified: F43.10

## 2011-06-05 HISTORY — DX: Unspecified osteoarthritis, unspecified site: M19.90

## 2011-06-05 LAB — HEMOGLOBIN A1C: Hgb A1c MFr Bld: 9.8 % — ABNORMAL HIGH (ref <5.7)

## 2011-06-05 LAB — BASIC METABOLIC PANEL
BUN: 8 mg/dL (ref 6–23)
CO2: 23 mEq/L (ref 19–32)
Calcium: 9.3 mg/dL (ref 8.4–10.5)
Chloride: 95 mEq/L — ABNORMAL LOW (ref 96–112)
Creatinine, Ser: 0.58 mg/dL (ref 0.50–1.35)
GFR calc Af Amer: >90 mL/min (ref >90)
GFR calc non Af Amer: >90 mL/min (ref >90)
Glucose, Bld: 268 mg/dL — ABNORMAL HIGH (ref 70–99)
Potassium: 4.1 mEq/L (ref 3.5–5.1)
Sodium: 131 mEq/L — ABNORMAL LOW (ref 135–145)

## 2011-06-05 LAB — CBC
HCT: 42.3 % (ref 39.0–52.0)
Hemoglobin: 15.2 g/dL (ref 13.0–17.0)
MCH: 32.8 pg (ref 26.0–34.0)
MCHC: 35.9 g/dL (ref 30.0–36.0)
MCV: 91.4 fL (ref 78.0–100.0)
Platelets: 173 10*3/uL (ref 150–400)
RBC: 4.63 MIL/uL (ref 4.22–5.81)
RDW: 12.3 % (ref 11.5–15.5)
WBC: 12.5 10*3/uL — ABNORMAL HIGH (ref 4.0–10.5)

## 2011-06-05 LAB — DIFFERENTIAL
Basophils Absolute: 0 10*3/uL (ref 0.0–0.1)
Basophils Relative: 0 % (ref 0–1)
Eosinophils Absolute: 0 10*3/uL (ref 0.0–0.7)
Eosinophils Relative: 0 % (ref 0–5)
Lymphocytes Relative: 13 % (ref 12–46)
Lymphs Abs: 1.6 10*3/uL (ref 0.7–4.0)
Monocytes Absolute: 1.1 10*3/uL — ABNORMAL HIGH (ref 0.1–1.0)
Monocytes Relative: 9 % (ref 3–12)
Neutro Abs: 9.7 10*3/uL — ABNORMAL HIGH (ref 1.7–7.7)
Neutrophils Relative %: 78 % — ABNORMAL HIGH (ref 43–77)

## 2011-06-05 LAB — GLUCOSE, CAPILLARY

## 2011-06-05 MED ORDER — MORPHINE SULFATE 4 MG/ML IJ SOLN
6.0000 mg | Freq: Once | INTRAMUSCULAR | Status: AC
  Administered 2011-06-05: 6 mg via INTRAVENOUS
  Filled 2011-06-05: qty 2

## 2011-06-05 MED ORDER — ONDANSETRON HCL 4 MG/2ML IJ SOLN
4.0000 mg | Freq: Four times a day (QID) | INTRAMUSCULAR | Status: DC | PRN
  Administered 2011-06-06: 4 mg via INTRAVENOUS
  Filled 2011-06-05: qty 2

## 2011-06-05 MED ORDER — VANCOMYCIN HCL IN DEXTROSE 1-5 GM/200ML-% IV SOLN
1000.0000 mg | Freq: Two times a day (BID) | INTRAVENOUS | Status: DC
  Administered 2011-06-06 – 2011-06-07 (×3): 1000 mg via INTRAVENOUS
  Filled 2011-06-05 (×4): qty 200

## 2011-06-05 MED ORDER — METOPROLOL TARTRATE 25 MG PO TABS
25.0000 mg | ORAL_TABLET | Freq: Two times a day (BID) | ORAL | Status: DC
  Administered 2011-06-05 – 2011-06-08 (×6): 25 mg via ORAL
  Filled 2011-06-05 (×7): qty 1

## 2011-06-05 MED ORDER — MORPHINE SULFATE 4 MG/ML IJ SOLN
INTRAMUSCULAR | Status: AC
  Administered 2011-06-05: 4 mg
  Filled 2011-06-05: qty 1

## 2011-06-05 MED ORDER — POTASSIUM CHLORIDE IN NACL 20-0.9 MEQ/L-% IV SOLN
INTRAVENOUS | Status: DC
  Administered 2011-06-05: 22:00:00 via INTRAVENOUS
  Filled 2011-06-05 (×3): qty 1000

## 2011-06-05 MED ORDER — DOCUSATE SODIUM 100 MG PO CAPS
100.0000 mg | ORAL_CAPSULE | Freq: Two times a day (BID) | ORAL | Status: DC
  Administered 2011-06-05 – 2011-06-08 (×5): 100 mg via ORAL
  Filled 2011-06-05 (×5): qty 1

## 2011-06-05 MED ORDER — DIPHENHYDRAMINE HCL 12.5 MG/5ML PO ELIX
12.5000 mg | ORAL_SOLUTION | Freq: Four times a day (QID) | ORAL | Status: DC | PRN
  Filled 2011-06-05: qty 10

## 2011-06-05 MED ORDER — MORPHINE SULFATE 2 MG/ML IJ SOLN
1.0000 mg | INTRAMUSCULAR | Status: DC | PRN
  Administered 2011-06-06 (×3): 4 mg via INTRAVENOUS
  Filled 2011-06-05 (×3): qty 2

## 2011-06-05 MED ORDER — HYDROMORPHONE HCL PF 1 MG/ML IJ SOLN
1.0000 mg | Freq: Once | INTRAMUSCULAR | Status: AC
  Administered 2011-06-05: 1 mg via INTRAVENOUS
  Filled 2011-06-05: qty 1

## 2011-06-05 MED ORDER — INSULIN ASPART 100 UNIT/ML ~~LOC~~ SOLN
0.0000 [IU] | Freq: Three times a day (TID) | SUBCUTANEOUS | Status: DC
  Administered 2011-06-06 – 2011-06-07 (×2): 5 [IU] via SUBCUTANEOUS
  Administered 2011-06-07: 11 [IU] via SUBCUTANEOUS
  Administered 2011-06-07 – 2011-06-08 (×2): 8 [IU] via SUBCUTANEOUS

## 2011-06-05 MED ORDER — INSULIN ASPART 100 UNIT/ML ~~LOC~~ SOLN
4.0000 [IU] | Freq: Three times a day (TID) | SUBCUTANEOUS | Status: DC
  Administered 2011-06-06: 18:00:00 via SUBCUTANEOUS
  Administered 2011-06-07 – 2011-06-08 (×3): 4 [IU] via SUBCUTANEOUS

## 2011-06-05 MED ORDER — VANCOMYCIN HCL 1000 MG IV SOLR
2000.0000 mg | Freq: Once | INTRAVENOUS | Status: AC
  Administered 2011-06-05: 2000 mg via INTRAVENOUS
  Filled 2011-06-05: qty 2000

## 2011-06-05 MED ORDER — HYDROCODONE-ACETAMINOPHEN 5-325 MG PO TABS: 1.0000 | ORAL_TABLET | ORAL | Status: DC | PRN

## 2011-06-05 MED ORDER — ONDANSETRON HCL 4 MG/2ML IJ SOLN
4.0000 mg | Freq: Once | INTRAMUSCULAR | Status: AC
  Administered 2011-06-05: 4 mg via INTRAVENOUS
  Filled 2011-06-05: qty 2

## 2011-06-05 MED ORDER — ACETAMINOPHEN 650 MG RE SUPP: 650.0000 mg | Freq: Four times a day (QID) | RECTAL | Status: DC | PRN

## 2011-06-05 MED ORDER — PIPERACILLIN-TAZOBACTAM 3.375 G IVPB
3.3750 g | Freq: Three times a day (TID) | INTRAVENOUS | Status: DC
  Administered 2011-06-05 – 2011-06-07 (×5): 3.375 g via INTRAVENOUS
  Filled 2011-06-05 (×7): qty 50

## 2011-06-05 MED ORDER — ACETAMINOPHEN 325 MG PO TABS
650.0000 mg | ORAL_TABLET | Freq: Four times a day (QID) | ORAL | Status: DC | PRN
  Administered 2011-06-08: 650 mg via ORAL
  Filled 2011-06-05: qty 2

## 2011-06-05 MED ORDER — IOHEXOL 300 MG/ML  SOLN
100.0000 mL | Freq: Once | INTRAMUSCULAR | Status: AC | PRN
  Administered 2011-06-05: 100 mL via INTRAVENOUS

## 2011-06-05 MED ORDER — DIPHENHYDRAMINE HCL 50 MG/ML IJ SOLN: 12.5000 mg | Freq: Four times a day (QID) | INTRAMUSCULAR | Status: DC | PRN

## 2011-06-05 NOTE — Progress Notes (Addendum)
ANTIBIOTIC CONSULT NOTE - INITIAL  Pharmacy Consult for Vancomycin, Zosyn Indication: Perirectal cellulits  No Known Allergies  Patient Measurements: Height: 5' 11"  (180.3 cm) Weight: 246 lb 3.2 oz (111.676 kg) IBW/kg (Calculated) : 75.3  Adjusted Body Weight:  Vital Signs: Temp: 98.8 F (37.1 C) (05/29 1939) Temp src: Oral (05/29 1204) BP: 142/83 mmHg (05/29 1939) Pulse Rate: 99  (05/29 1939) Intake/Output from previous day:   Intake/Output from this shift: Total I/O In: 120 [P.O.:120] Out: -   Labs:  Basename 06/05/11 1307  WBC 12.5*  HGB 15.2  PLT 173  LABCREA --  CREATININE 0.58   Estimated Creatinine Clearance: 129.5 ml/min (by C-G formula based on Cr of 0.58). No results found for this basename: VANCOTROUGH:2,VANCOPEAK:2,VANCORANDOM:2,GENTTROUGH:2,GENTPEAK:2,GENTRANDOM:2,TOBRATROUGH:2,TOBRAPEAK:2,TOBRARND:2,AMIKACINPEAK:2,AMIKACINTROU:2,AMIKACIN:2, in the last 72 hours   Microbiology: No results found for this or any previous visit (from the past 720 hour(s)).  Medical History: Past Medical History  Diagnosis Date  . Colon cancer   . Diabetes mellitus   . Fistula, anal     multiple  . Abscess of anal and rectal regions     horse shoe abscess  . Poor circulation   . Fatigue   . Sleep deprivation     Medications:  Scheduled:    . docusate sodium  100 mg Oral BID  .  HYDROmorphone (DILAUDID) injection  1 mg Intravenous Once  . insulin aspart  0-15 Units Subcutaneous TID WC  . insulin aspart  4 Units Subcutaneous TID WC  . metoprolol tartrate  25 mg Oral BID  .  morphine injection  6 mg Intravenous Once  . morphine      . ondansetron (ZOFRAN) IV  4 mg Intravenous Once  . piperacillin-tazobactam (ZOSYN)  IV  3.375 g Intravenous Q8H  . vancomycin  2,000 mg Intravenous Once  . vancomycin  1,000 mg Intravenous Q12H   Assessment: 56 yr old male admitted for treatment of recurrent perirectal abscesses. He is to start Zosyn and Vancomycin per Rx. Plan  is for I&D in the OR tomorrow.  Goal of Therapy:  Vancomycin trough  10-15  Plan:  Zosyn 3.375 Gm IV q8h. Vancomycin 2000 mg x 1 then 1 Gm q12h. Trough level when appropriate.   Minta Balsam 06/05/2011,8:38 PM

## 2011-06-05 NOTE — H&P (Signed)
Matthew Lewis is an 56 y.o. male.   Chief Complaint: Perirectal pain HPI:  Pt is a 56 year old male with a long history of multiple abscesses/ perirectal fistula.  The last episode was when he had a horseshoe abscess last summer drained by Dr. Redmond Pulling. He has been doing well for a year without perirectal pain.  This pain started around 4 days ago, and it is progressively getting worse.  In the past, he has waited longer until it has become excruciating and draining.  He has been having low grade temperatures up to around 99-100 degrees.  He has not experienced any drainage yet.  Ibuprofen has not been helping the pain.  The pain and swelling is worse on the right side.  It has gotten to where it is painful when he sits.  Of note, he has gone off all of his medications.    Past Medical History  Diagnosis Date  . Colon cancer   . Diabetes mellitus   . Fistula, anal     multiple  . Abscess of anal and rectal regions     horse shoe abscess  . Poor circulation   . Fatigue   . Sleep deprivation     Past Surgical History  Procedure Date  . Hemicolectomy 06/06/2005    left  . Incision and drainage perirectal abscess 03/30/2005  . Examination under anesthesia 03/22/2005    fistula  . Incision and drainage perirectal abscess 12/13/2004  . Incision and drainage perirectal abscess 07/14/2001  . Anal fistulectomy 12/13/1998  . Incision and drainage perirectal abscess 08/12/2010    horseshoe abscess; Dr Redmond Pulling    Family History  Problem Relation Age of Onset  . Heart disease Father   . Heart disease Mother   . Cancer Mother   . Heart disease Brother   . Cancer Sister    Social History:  reports that he has quit smoking. He does not have any smokeless tobacco history on file. He reports that he does not drink alcohol or use illicit drugs.  Allergies: No Known Allergies   (Not in a hospital admission)  Results for orders placed during the hospital encounter of 06/05/11 (from the past 48  hour(s))  CBC     Status: Abnormal   Collection Time   06/05/11  1:07 PM      Component Value Range Comment   WBC 12.5 (*) 4.0 - 10.5 (K/uL)    RBC 4.63  4.22 - 5.81 (MIL/uL)    Hemoglobin 15.2  13.0 - 17.0 (g/dL)    HCT 42.3  39.0 - 52.0 (%)    MCV 91.4  78.0 - 100.0 (fL)    MCH 32.8  26.0 - 34.0 (pg)    MCHC 35.9  30.0 - 36.0 (g/dL)    RDW 12.3  11.5 - 15.5 (%)    Platelets 173  150 - 400 (K/uL)   DIFFERENTIAL     Status: Abnormal   Collection Time   06/05/11  1:07 PM      Component Value Range Comment   Neutrophils Relative 78 (*) 43 - 77 (%)    Neutro Abs 9.7 (*) 1.7 - 7.7 (K/uL)    Lymphocytes Relative 13  12 - 46 (%)    Lymphs Abs 1.6  0.7 - 4.0 (K/uL)    Monocytes Relative 9  3 - 12 (%)    Monocytes Absolute 1.1 (*) 0.1 - 1.0 (K/uL)    Eosinophils Relative 0  0 - 5 (%)  Eosinophils Absolute 0.0  0.0 - 0.7 (K/uL)    Basophils Relative 0  0 - 1 (%)    Basophils Absolute 0.0  0.0 - 0.1 (K/uL)   BASIC METABOLIC PANEL     Status: Abnormal   Collection Time   06/05/11  1:07 PM      Component Value Range Comment   Sodium 131 (*) 135 - 145 (mEq/L)    Potassium 4.1  3.5 - 5.1 (mEq/L)    Chloride 95 (*) 96 - 112 (mEq/L)    CO2 23  19 - 32 (mEq/L)    Glucose, Bld 268 (*) 70 - 99 (mg/dL)    BUN 8  6 - 23 (mg/dL)    Creatinine, Ser 0.58  0.50 - 1.35 (mg/dL)    Calcium 9.3  8.4 - 10.5 (mg/dL)    GFR calc non Af Amer >90  >90 (mL/min)    GFR calc Af Amer >90  >90 (mL/min)    Ct Pelvis W Contrast  06/05/2011  *RADIOLOGY REPORT*  Clinical Data:  For abscess.  Pain and fever.  CT PELVIS WITH CONTRAST  Technique:  Multidetector CT imaging of the pelvis was performed using the standard protocol following the bolus administration of intravenous contrast.  Contrast: 190m OMNIPAQUE IOHEXOL 300 MG/ML  SOLN  Comparison:  08/11/2010.  Findings:  A low density collection with peripherally hyperattenuating rim is seen in the right perirectal region, measuring approximately 6.3 x 6.1 x 5.1 cm  (measured on coronal and sagittal reformatted images).  Collection appears grossly similar to 08/11/2010.  There are inflammatory changes in the surrounding fat.  Slight anterior mass effect on the rectum.  Visualized bowel is otherwise unremarkable.  Scattered atherosclerotic calcification of the arterial vasculature.  No pathologically enlarged lymph nodes.  Incisional scar is seen in the ventral midline.  No worrisome lytic or sclerotic lesions.  IMPRESSION:  Low attenuation perirectal fluid collection is grossly stable from 08/11/2010.  Given the stability, an abscess is felt to be less likely but is not excluded.  Original Report Authenticated By: MLuretha Rued M.D.    Review of Systems  Constitutional: Positive for fever (low grade temperatures) and chills. Negative for weight loss, malaise/fatigue and diaphoresis.  HENT: Negative.   Eyes: Negative.   Respiratory: Negative.   Cardiovascular: Negative.   Gastrointestinal: Positive for abdominal pain.  Genitourinary: Negative.   Musculoskeletal: Positive for back pain.  Skin: Negative.   Neurological: Negative.  Negative for weakness.  Endo/Heme/Allergies: Negative.   Psychiatric/Behavioral: Negative.     Blood pressure 155/86, pulse 100, temperature 98.2 F (36.8 C), temperature source Oral, resp. rate 16, SpO2 99.00%. Physical Exam  Constitutional: He is oriented to person, place, and time. He appears well-developed and well-nourished. No distress.  HENT:  Head: Normocephalic and atraumatic.  Eyes: Conjunctivae are normal. Pupils are equal, round, and reactive to light. No scleral icterus.  Neck: Neck supple.  Cardiovascular: Normal rate, regular rhythm, normal heart sounds and intact distal pulses.  Exam reveals no gallop and no friction rub.   No murmur heard. Respiratory: Effort normal and breath sounds normal. No respiratory distress. He has no wheezes. He has no rales. He exhibits no tenderness.  GI: Soft. Bowel sounds  are normal. He exhibits no distension and no mass. There is no tenderness. There is no guarding.  Genitourinary: Rectal exam shows tenderness.          Tender area. Skin warm  Most tender at posterior right  Neurological: He is  alert and oriented to person, place, and time.  Skin: Skin is warm and dry. No rash noted. He is not diaphoretic. No erythema. No pallor.  Psychiatric: He has a normal mood and affect. His behavior is normal. Judgment and thought content normal.     Assessment/Plan Recurrent perirectal abscess. Previous horseshoe abscess (bilateral, wrapping around the back) Plan I&D in OR tomorrow.  IV antibiotics  Insulin for uncontrolled DM.    Hypertension - will add beta blockade.    Discussed surgery with pt.   Melisssa Donner 06/05/2011, 5:12 PM

## 2011-06-05 NOTE — Telephone Encounter (Signed)
Pt of Dr. Redmond Pulling (last seen 08/2010) calling with probable new fistula.  He is in extreme pain today.  Next available appt in late June.  Pt refused appt and is going to the ER now.  FYI.

## 2011-06-05 NOTE — ED Notes (Signed)
Pt received to [facility] with c/o abscess to rt buttocks x 5 days and low grade fever. Pt stated that he gas a hx of recurrent abscesses and fistulas. Pt is A/A/Ox4, skin is warm and dry, respiration is even and unlabored. Pt noted to be frustrated, wife is at the bedside

## 2011-06-05 NOTE — ED Provider Notes (Signed)
Pt care assumed from [place], initial provider PA [place]. Pt with hx recurrent perirectal abscess with fistulas, prior care by [facility]. Asymptomatic since last August when he underwent drainage of a horseshoe abscess by Dr Redmond Pulling. Pain increasing in last 4 days. No drainage PTA. Low-grade fevers. VS in ED sig for tachycardia, HTN.  On exam, pt is A&O x 3, NAD (but has been recently given IV dilaudid). Lungs CTAB. Heart tachycardia with regular rhythm. Abd s/NT. MAEW. Right medial buttock with erythema, warmth, tenderness, no area of fluctuance or definitive abscess seen.    Results for orders placed during the hospital encounter of 06/05/11  CBC      Component Value Range   WBC 12.5 (*) 4.0 - 10.5 (K/uL)   RBC 4.63  4.22 - 5.81 (MIL/uL)   Hemoglobin 15.2  13.0 - 17.0 (g/dL)   HCT 42.3  39.0 - 52.0 (%)   MCV 91.4  78.0 - 100.0 (fL)   MCH 32.8  26.0 - 34.0 (pg)   MCHC 35.9  30.0 - 36.0 (g/dL)   RDW 12.3  11.5 - 15.5 (%)   Platelets 173  150 - 400 (K/uL)  DIFFERENTIAL      Component Value Range   Neutrophils Relative 78 (*) 43 - 77 (%)   Neutro Abs 9.7 (*) 1.7 - 7.7 (K/uL)   Lymphocytes Relative 13  12 - 46 (%)   Lymphs Abs 1.6  0.7 - 4.0 (K/uL)   Monocytes Relative 9  3 - 12 (%)   Monocytes Absolute 1.1 (*) 0.1 - 1.0 (K/uL)   Eosinophils Relative 0  0 - 5 (%)   Eosinophils Absolute 0.0  0.0 - 0.7 (K/uL)   Basophils Relative 0  0 - 1 (%)   Basophils Absolute 0.0  0.0 - 0.1 (K/uL)  BASIC METABOLIC PANEL      Component Value Range   Sodium 131 (*) 135 - 145 (mEq/L)   Potassium 4.1  3.5 - 5.1 (mEq/L)   Chloride 95 (*) 96 - 112 (mEq/L)   CO2 23  19 - 32 (mEq/L)   Glucose, Bld 268 (*) 70 - 99 (mg/dL)   BUN 8  6 - 23 (mg/dL)   Creatinine, Ser 0.58  0.50 - 1.35 (mg/dL)   Calcium 9.3  8.4 - 10.5 (mg/dL)   GFR calc non Af Amer >90  >90 (mL/min)   GFR calc Af Amer >90  >90 (mL/min)   Ct Pelvis W Contrast  06/05/2011  *RADIOLOGY REPORT*  Clinical Data:  For abscess.  Pain and fever.  CT  PELVIS WITH CONTRAST  Technique:  Multidetector CT imaging of the pelvis was performed using the standard protocol following the bolus administration of intravenous contrast.  Contrast: 154m OMNIPAQUE IOHEXOL 300 MG/ML  SOLN  Comparison:  08/11/2010.  Findings:  A low density collection with peripherally hyperattenuating rim is seen in the right perirectal region, measuring approximately 6.3 x 6.1 x 5.1 cm (measured on coronal and sagittal reformatted images).  Collection appears grossly similar to 08/11/2010.  There are inflammatory changes in the surrounding fat.  Slight anterior mass effect on the rectum.  Visualized bowel is otherwise unremarkable.  Scattered atherosclerotic calcification of the arterial vasculature.  No pathologically enlarged lymph nodes.  Incisional scar is seen in the ventral midline.  No worrisome lytic or sclerotic lesions.  IMPRESSION:  Low attenuation perirectal fluid collection is grossly stable from 08/11/2010.  Given the stability, an abscess is felt to be less likely but is not  excluded.  Original Report Authenticated By: Luretha Rued, M.D.     Spoke with Dr Barry Dienes at 4:20 PM regarding CT scan results and lab findings, will see in ED.  Orlie Dakin, [place] 06/05/11 1732

## 2011-06-05 NOTE — ED Notes (Signed)
To ED for eval of rectal abscess. Pt states this is a recurrent event for him. Usually seen and treated by [facility], but unable to get into the office today. Pt appears in severe pain. Pt skin pasty. Ambulatory but slowly.

## 2011-06-06 ENCOUNTER — Inpatient Hospital Stay (HOSPITAL_COMMUNITY): Payer: BC Managed Care – PPO | Admitting: Anesthesiology

## 2011-06-06 ENCOUNTER — Encounter (HOSPITAL_COMMUNITY): Payer: Self-pay | Admitting: Anesthesiology

## 2011-06-06 ENCOUNTER — Other Ambulatory Visit: Payer: Self-pay

## 2011-06-06 ENCOUNTER — Encounter (HOSPITAL_COMMUNITY): Admission: EM | Disposition: A | Discharge status: Home / Self Care | Payer: Self-pay | Source: Home / Self Care

## 2011-06-06 ENCOUNTER — Inpatient Hospital Stay (HOSPITAL_COMMUNITY): Payer: BC Managed Care – PPO

## 2011-06-06 HISTORY — PX: INCISION AND DRAINAGE PERIRECTAL ABSCESS: SHX1804

## 2011-06-06 HISTORY — PX: EXAMINATION UNDER ANESTHESIA: SHX1540

## 2011-06-06 LAB — SURGICAL PCR SCREEN
MRSA, PCR: NEGATIVE
Staphylococcus aureus: NEGATIVE

## 2011-06-06 LAB — PHOSPHORUS: Phosphorus: 2.2 mg/dL — ABNORMAL LOW (ref 2.3–4.6)

## 2011-06-06 LAB — PROTIME-INR
INR: 1.15 (ref 0.00–1.49)
Prothrombin Time: 14.9 seconds (ref 11.6–15.2)

## 2011-06-06 LAB — BASIC METABOLIC PANEL
BUN: 8 mg/dL (ref 6–23)
Chloride: 92 mEq/L — ABNORMAL LOW (ref 96–112)
GFR calc Af Amer: >90 mL/min (ref >90)
Potassium: 5.2 mEq/L — ABNORMAL HIGH (ref 3.5–5.1)
Sodium: 130 mEq/L — ABNORMAL LOW (ref 135–145)

## 2011-06-06 LAB — GLUCOSE, CAPILLARY: Glucose-Capillary: 250 mg/dL — ABNORMAL HIGH (ref 70–99)

## 2011-06-06 LAB — MAGNESIUM: Magnesium: 1.9 mg/dL (ref 1.5–2.5)

## 2011-06-06 SURGERY — EXAM UNDER ANESTHESIA
Anesthesia: General | Site: Perineum | Wound class: Dirty or Infected

## 2011-06-06 MED ORDER — SURGILUBE EX GEL
CUTANEOUS | Status: DC | PRN
  Administered 2011-06-06: 1 via TOPICAL

## 2011-06-06 MED ORDER — BIOTENE DRY MOUTH MT LIQD
15.0000 mL | Freq: Two times a day (BID) | OROMUCOSAL | Status: DC
  Administered 2011-06-07: 15 mL via OROMUCOSAL

## 2011-06-06 MED ORDER — LACTATED RINGERS IV SOLN: INTRAVENOUS | Status: DC

## 2011-06-06 MED ORDER — CEFAZOLIN SODIUM 1-5 GM-% IV SOLN
INTRAVENOUS | Status: AC
  Filled 2011-06-06: qty 100

## 2011-06-06 MED ORDER — MEPERIDINE HCL 25 MG/ML IJ SOLN: 6.2500 mg | INTRAMUSCULAR | Status: DC | PRN

## 2011-06-06 MED ORDER — 0.9 % SODIUM CHLORIDE (POUR BTL) OPTIME
TOPICAL | Status: DC | PRN
  Administered 2011-06-06: 1000 mL

## 2011-06-06 MED ORDER — MIDAZOLAM HCL 5 MG/5ML IJ SOLN
INTRAMUSCULAR | Status: DC | PRN
  Administered 2011-06-06: 2 mg via INTRAVENOUS

## 2011-06-06 MED ORDER — CHLORHEXIDINE GLUCONATE 0.12 % MT SOLN
15.0000 mL | Freq: Two times a day (BID) | OROMUCOSAL | Status: DC
  Administered 2011-06-07: 15 mL via OROMUCOSAL
  Filled 2011-06-06 (×2): qty 15

## 2011-06-06 MED ORDER — MORPHINE SULFATE 2 MG/ML IJ SOLN
1.0000 mg | INTRAMUSCULAR | Status: DC | PRN
  Administered 2011-06-06: 4 mg via INTRAVENOUS
  Filled 2011-06-06: qty 2

## 2011-06-06 MED ORDER — PROMETHAZINE HCL 25 MG/ML IJ SOLN: 6.2500 mg | INTRAMUSCULAR | Status: DC | PRN

## 2011-06-06 MED ORDER — FENTANYL CITRATE 0.05 MG/ML IJ SOLN
INTRAMUSCULAR | Status: DC | PRN
  Administered 2011-06-06 (×2): 50 ug via INTRAVENOUS
  Administered 2011-06-06: 100 ug via INTRAVENOUS

## 2011-06-06 MED ORDER — FENTANYL CITRATE 0.05 MG/ML IJ SOLN: 25.0000 ug | INTRAMUSCULAR | Status: DC | PRN

## 2011-06-06 MED ORDER — OXYCODONE-ACETAMINOPHEN 5-325 MG PO TABS: 1.0000 | ORAL_TABLET | ORAL | Status: DC | PRN

## 2011-06-06 MED ORDER — ONDANSETRON HCL 4 MG/2ML IJ SOLN
INTRAMUSCULAR | Status: DC | PRN
  Administered 2011-06-06: 4 mg via INTRAVENOUS

## 2011-06-06 MED ORDER — ACETAMINOPHEN 10 MG/ML IV SOLN
INTRAVENOUS | Status: AC
  Filled 2011-06-06: qty 100

## 2011-06-06 MED ORDER — INSULIN ASPART 100 UNIT/ML ~~LOC~~ SOLN
5.0000 [IU] | Freq: Once | SUBCUTANEOUS | Status: AC
  Administered 2011-06-06: 5 [IU] via SUBCUTANEOUS

## 2011-06-06 MED ORDER — LACTATED RINGERS IV SOLN
INTRAVENOUS | Status: DC
  Administered 2011-06-06: 12:00:00 via INTRAVENOUS

## 2011-06-06 MED ORDER — MORPHINE SULFATE 2 MG/ML IJ SOLN
1.0000 mg | INTRAMUSCULAR | Status: DC | PRN
  Administered 2011-06-07: 4 mg via INTRAVENOUS
  Filled 2011-06-06: qty 2

## 2011-06-06 MED ORDER — SODIUM CHLORIDE 0.9 % IV SOLN
INTRAVENOUS | Status: DC
  Administered 2011-06-06: 09:00:00 via INTRAVENOUS

## 2011-06-06 MED ORDER — CEFAZOLIN SODIUM 1-5 GM-% IV SOLN
INTRAVENOUS | Status: DC | PRN
  Administered 2011-06-06: 2 g via INTRAVENOUS

## 2011-06-06 MED ORDER — BUPIVACAINE LIPOSOME 1.3 % IJ SUSP
20.0000 mL | INTRAMUSCULAR | Status: AC
  Administered 2011-06-06: 20 mL
  Filled 2011-06-06: qty 20

## 2011-06-06 MED ORDER — LIDOCAINE HCL 1 % IJ SOLN
INTRAMUSCULAR | Status: DC | PRN
  Administered 2011-06-06: 100 mg via INTRADERMAL

## 2011-06-06 MED ORDER — PROPOFOL 10 MG/ML IV EMUL
INTRAVENOUS | Status: DC | PRN
  Administered 2011-06-06: 200 mg via INTRAVENOUS

## 2011-06-06 MED ORDER — LACTATED RINGERS IV SOLN
INTRAVENOUS | Status: DC | PRN
  Administered 2011-06-06: 12:00:00 via INTRAVENOUS

## 2011-06-06 MED ORDER — ACETAMINOPHEN 10 MG/ML IV SOLN
INTRAVENOUS | Status: DC | PRN
  Administered 2011-06-06: 1000 mg via INTRAVENOUS

## 2011-06-06 SURGICAL SUPPLY — 47 items
BANDAGE GAUZE 4  KLING STR (GAUZE/BANDAGES/DRESSINGS) ×2 IMPLANT
BLADE SURG 15 STRL LF DISP TIS (BLADE) ×1 IMPLANT
BLADE SURG 15 STRL SS (BLADE) ×2
CANISTER SUCTION 2500CC (MISCELLANEOUS) ×2 IMPLANT
CLEANER TIP ELECTROSURG 2X2 (MISCELLANEOUS) IMPLANT
CLOTH BEACON ORANGE TIMEOUT ST (SAFETY) ×2 IMPLANT
COVER MAYO STAND STRL (DRAPES) ×2 IMPLANT
COVER SURGICAL LIGHT HANDLE (MISCELLANEOUS) ×2 IMPLANT
DRAPE UTILITY 15X26 W/TAPE STR (DRAPE) ×4 IMPLANT
DRSG PAD ABDOMINAL 8X10 ST (GAUZE/BANDAGES/DRESSINGS) ×2 IMPLANT
ELECT CAUTERY BLADE 6.4 (BLADE) ×2 IMPLANT
ELECT REM PT RETURN 9FT ADLT (ELECTROSURGICAL) ×2
ELECTRODE REM PT RTRN 9FT ADLT (ELECTROSURGICAL) ×1 IMPLANT
GAUZE PACKING IODOFORM 1 (PACKING) IMPLANT
GAUZE SPONGE 4X4 16PLY XRAY LF (GAUZE/BANDAGES/DRESSINGS) ×2 IMPLANT
GLOVE BIO SURGEON STRL SZ 6 (GLOVE) ×2 IMPLANT
GLOVE BIO SURGEON STRL SZ7.5 (GLOVE) ×2 IMPLANT
GLOVE BIOGEL PI IND STRL 6.5 (GLOVE) ×1 IMPLANT
GLOVE BIOGEL PI IND STRL 7.5 (GLOVE) ×1 IMPLANT
GLOVE BIOGEL PI INDICATOR 6.5 (GLOVE) ×1
GLOVE BIOGEL PI INDICATOR 7.5 (GLOVE) ×1
GOWN PREVENTION PLUS XXLARGE (GOWN DISPOSABLE) ×2 IMPLANT
GOWN STRL NON-REIN LRG LVL3 (GOWN DISPOSABLE) IMPLANT
KIT BASIN OR (CUSTOM PROCEDURE TRAY) ×2 IMPLANT
KIT ROOM TURNOVER OR (KITS) ×2 IMPLANT
NEEDLE 18GX1X1/2 (RX/OR ONLY) (NEEDLE) ×2 IMPLANT
NEEDLE HYPO 25GX1X1/2 BEV (NEEDLE) ×2 IMPLANT
NS IRRIG 1000ML POUR BTL (IV SOLUTION) ×2 IMPLANT
PACK LITHOTOMY IV (CUSTOM PROCEDURE TRAY) ×2 IMPLANT
PAD ARMBOARD 7.5X6 YLW CONV (MISCELLANEOUS) ×4 IMPLANT
PENCIL BUTTON HOLSTER BLD 10FT (ELECTRODE) ×2 IMPLANT
SPONGE GAUZE 4X4 12PLY (GAUZE/BANDAGES/DRESSINGS) ×2 IMPLANT
SPONGE LAP 18X18 X RAY DECT (DISPOSABLE) ×2 IMPLANT
SURGILUBE 2OZ TUBE FLIPTOP (MISCELLANEOUS) ×2 IMPLANT
SUT CHROMIC 2 0 SH (SUTURE) IMPLANT
SUT CHROMIC 3 0 SH 27 (SUTURE) IMPLANT
SWAB COLLECTION DEVICE MRSA (MISCELLANEOUS) ×2 IMPLANT
SYR BULB 3OZ (MISCELLANEOUS) ×2 IMPLANT
SYR CONTROL 10ML LL (SYRINGE) ×2 IMPLANT
TAPE CLOTH SURG 4X10 WHT LF (GAUZE/BANDAGES/DRESSINGS) ×2 IMPLANT
TOWEL OR 17X24 6PK STRL BLUE (TOWEL DISPOSABLE) IMPLANT
TOWEL OR 17X26 10 PK STRL BLUE (TOWEL DISPOSABLE) IMPLANT
TUBE ANAEROBIC SPECIMEN COL (MISCELLANEOUS) ×2 IMPLANT
TUBE CONNECTING 12X1/4 (SUCTIONS) ×2 IMPLANT
UNDERPAD 30X30 INCONTINENT (UNDERPADS AND DIAPERS) ×2 IMPLANT
WATER STERILE IRR 1000ML POUR (IV SOLUTION) IMPLANT
YANKAUER SUCT BULB TIP NO VENT (SUCTIONS) ×2 IMPLANT

## 2011-06-06 NOTE — Progress Notes (Signed)
Pt received from ED. Oriented to room. Pt alert and stable.

## 2011-06-06 NOTE — Anesthesia Postprocedure Evaluation (Signed)
  Anesthesia Post-op Note  Patient: Matthew Lewis  Procedure(s) Performed: Procedure(s) (LRB): EXAM UNDER ANESTHESIA (N/A) IRRIGATION AND DEBRIDEMENT PERIRECTAL ABSCESS (N/A)  Patient Location: PACU  Anesthesia Type: General  Level of Consciousness: awake and alert   Airway and Oxygen Therapy: Patient Spontanous Breathing  Post-op Pain: mild  Post-op Assessment: Post-op Vital signs reviewed, Patient's Cardiovascular Status Stable, Respiratory Function Stable, Patent Airway and No signs of Nausea or vomiting  Post-op Vital Signs: stable  Complications: No apparent anesthesia complications

## 2011-06-06 NOTE — Progress Notes (Signed)
Pt [facility] 238, Dr.Carnigie notified, new orders rec'd, pt medicated with 5 units of novolog, will cont to assess

## 2011-06-06 NOTE — ED Provider Notes (Signed)
Medical screening examination/treatment/procedure(s) were performed by non-physician practitioner and as supervising physician I was immediately available for consultation/collaboration.  Barbara Cower, MD 06/06/11 [phone]

## 2011-06-06 NOTE — Preoperative (Signed)
Beta Blockers   Reason not to administer Beta Blockers:Not Applicable 

## 2011-06-06 NOTE — Clinical Social Work Psychosocial (Signed)
     Clinical Social Work Department BRIEF PSYCHOSOCIAL ASSESSMENT 06/06/2011  Patient:  Matthew Lewis, Matthew Lewis     Account Number:  [id number]     Admit date:  06/05/2011  Clinical Social Worker:  Earlie Server  Date/Time:  06/06/2011 09:45 AM  Referred by:  Physician  Date Referred:  06/06/2011 Referred for  Advanced Directives   Other Referral:   Interview type:  Patient Other interview type:    PSYCHOSOCIAL DATA Living Status:  FAMILY Admitted from facility:   Level of care:   Primary support name:  Matthew Lewis Primary support relationship to patient:  SPOUSE Degree of support available:   Adequate    CURRENT CONCERNS Current Concerns  Other - See comment   Other Concerns:   Advanced directives    SOCIAL WORK ASSESSMENT / PLAN CSW received referral to assist with advanced directives. CSW met with patient at bedside. No visitors present. CSW introduced myself and explained my role. CSW spoke with patient regarding advanced directives and if he had any questions. Patient reported that wife had requested advanced directives and that she would probably have questions. Patient reports that wife works third shift and will be at the hospital in the afternoon. CSW left packet along with CSW contact information. Patient stated he and wife will call CSW if any questions arise. CSW explained that CSW can notarize if needed.   Assessment/plan status:  Psychosocial Support/Ongoing Assessment of Needs Other assessment/ plan:   Information/referral to community resources:   Advanced directives packet    PATIENTS/FAMILYS RESPONSE TO PLAN OF CARE: Patient reports that his wife handles all "this stuff". Patient was receptive to [place] visit and reports that wife will call with any questions but reports no further needs at this time.

## 2011-06-06 NOTE — Transfer of Care (Signed)
Immediate Anesthesia Transfer of Care Note  Patient: Matthew Lewis  Procedure(s) Performed: Procedure(s) (LRB): EXAM UNDER ANESTHESIA (N/A) IRRIGATION AND DEBRIDEMENT PERIRECTAL ABSCESS (N/A)  Patient Location: PACU  Anesthesia Type: General  Level of Consciousness: awake and alert   Airway & Oxygen Therapy: Patient Spontanous Breathing and Patient connected to face mask oxygen  Post-op Assessment: Report given to PACU RN  Post vital signs: Reviewed and stable  Complications: No apparent anesthesia complications

## 2011-06-06 NOTE — Op Note (Signed)
Incision and Drainage Right Deep Ischiorectal abscess Operative Note  Pre-operative Diagnosis: Perirectal abscess  Post-operative Diagnosis: Deep ischiorectal abscess  Indications: Pt with recurrent pain, swelling, cellulitis, and fluid collection on CT scan  Anesthesia: General and Exparel.    Procedure Details  The procedure, risks and complications have been discussed in detail (including, infection, bleeding, need for additional procedures) with the patient, and the patient has signed consent to the procedure.  The patient was informed that the wound would be left open.    The patient was taken to OR 17 and general anesthesia was induced. The skin was sterilely prepped and draped over the affected area in the usual fashion. Time out was performed according to the surgical safety checklist.   A Syringe was used to aspirate in order to locate the purulent drainage.  I&D with a #11 blade was performed on the right perianal region posterior to the old scar.  Numerous septations were present.  The pus was deep in the perirectal space and not superficial.  Copious amounts of purulent discharge were obtained.  Cultures were sent. The wound was packed with betadine soaked gauze.  Exparel was placed in the skin and soft tissue around the opening.    The perianal skin was cleaned and dried.  The wound was dressed with gauze and mesh underwear.   Counts were correct.  The patient was extubated and taken to the OR in stable condition.  Findings: Deep ischiorectal abscess  EBL: 50 cc's  Condition: Tolerated procedure well  Complications: none known.

## 2011-06-06 NOTE — Anesthesia Preprocedure Evaluation (Addendum)
Anesthesia Evaluation  Patient identified by MRN, date of birth, ID band Patient awake    Reviewed: Allergy & Precautions, H&P , NPO status , Patient's Chart, lab work & pertinent test results  History of Anesthesia Complications Negative for: history of anesthetic complications  Airway Mallampati: I TM Distance: >3 FB Neck ROM: Full    Dental No notable dental hx. (+) Teeth Intact and Dental Advisory Given   Pulmonary neg pulmonary ROS,  breath sounds clear to auscultation  Pulmonary exam normal       Cardiovascular negative cardio ROS  Rhythm:Regular Rate:Normal     Neuro/Psych PSYCHIATRIC DISORDERS (PTSD) Anxiety negative neurological ROS  negative psych ROS   GI/Hepatic negative GI ROS, Neg liver ROS,   Endo/Other  negative endocrine ROSDiabetes mellitus-, Type 2  Renal/GU negative Renal ROS  negative genitourinary   Musculoskeletal negative musculoskeletal ROS (+)   Abdominal (+) + obese,   Peds negative pediatric ROS (+)  Hematology negative hematology ROS (+)   Anesthesia Other Findings   Reproductive/Obstetrics negative OB ROS                         Anesthesia Physical Anesthesia Plan  ASA: II  Anesthesia Plan: General   Post-op Pain Management:    Induction: Intravenous  Airway Management Planned: LMA  Additional Equipment:   Intra-op Plan:   Post-operative Plan: Extubation in OR  Informed Consent: I have reviewed the patients History and Physical, chart, labs and discussed the procedure including the risks, benefits and alternatives for the proposed anesthesia with the patient or authorized representative who has indicated his/her understanding and acceptance.   Dental advisory given  Plan Discussed with: CRNA  Anesthesia Plan Comments:         Anesthesia Quick Evaluation

## 2011-06-06 NOTE — Progress Notes (Signed)
Agree with above. Plan I&D

## 2011-06-06 NOTE — Progress Notes (Signed)
Patient ID: Matthew Lewis, male   DOB: 1955/08/02, 56 y.o.   MRN: [id number]    Subjective: Pt tearful.  C/o some buttock pain  Objective: Vital signs in last 24 hours: Temp:  [98.2 F (36.8 C)-99.2 F (37.3 C)] 98.9 F (37.2 C) (05/30 1019) Pulse Rate:  [91-117] 92  (05/30 1019) Resp:  [16-20] 18  (05/30 1019) BP: (121-156)/(74-100) 137/80 mmHg (05/30 1019) SpO2:  [91 %-100 %] 100 % (05/30 1019) Weight:  [246 lb 3.2 oz (111.676 kg)] 246 lb 3.2 oz (111.676 kg) (05/29 1939) Last BM Date: 06/04/11  Intake/Output from previous day: 05/29 0701 - 05/30 0700 In: 480.8 [P.O.:120; I.V.:310.8; IV Piggyback:50] Out: 1150 [Urine:1150] Intake/Output this shift:    PE: Buttock: deferred.  Going to OR today  Lab Results:   Basename 06/05/11 1307  WBC 12.5*  HGB 15.2  HCT 42.3  PLT 173   BMET  Basename 06/06/11 0625 06/05/11 1307  NA 130* 131*  K 5.2* 4.1  CL 92* 95*  CO2 25 23  GLUCOSE 269* 268*  BUN 8 8  CREATININE 0.64 0.58  CALCIUM 9.2 9.3   PT/INR  Basename 06/06/11 0625  LABPROT 14.9  INR 1.15   CMP     Component Value Date/Time   NA 130* 06/06/2011 0625   K 5.2* 06/06/2011 0625   CL 92* 06/06/2011 0625   CO2 25 06/06/2011 0625   GLUCOSE 269* 06/06/2011 0625   BUN 8 06/06/2011 0625   CREATININE 0.64 06/06/2011 0625   CALCIUM 9.2 06/06/2011 0625   PROT 8.0 08/11/2010 1553   ALBUMIN 3.6 08/11/2010 1553   AST 22 08/11/2010 1553   ALT 19 08/11/2010 1553   ALKPHOS 75 08/11/2010 1553   BILITOT 1.2 08/11/2010 1553   GFRNONAA >90 06/06/2011 0625   GFRAA >90 06/06/2011 0625   Lipase     Component Value Date/Time   LIPASE 23 04/01/2009 0935       Studies/Results: Dg Chest 2 View  06/06/2011  *RADIOLOGY REPORT*  Clinical Data: Preop, low grade fever  CHEST - 2 VIEW  Comparison: Portable chest x-ray of 10/19/2008  Findings: No definite active infiltrate or effusion is seen.  There is slight increased opacity overlying the anterior left sixth rib which probably is due to  overlapping bony and vascular shadows, but attention to this area on follow-up chest x-ray is recommended. Mediastinal contours appear stable.  The heart is mildly enlarged and stable.  No bony abnormality is seen.  A lower anterior cervical spine fusion plate is present.  IMPRESSION:  1.  No active lung disease. 2.  Vague opacity at the left lung base may be superimposition of bony and vascular structures.  Recommend attention to this area on follow-up chest x-ray.  Original Report Authenticated By: Joretta Bachelor, M.D.   Ct Pelvis W Contrast  06/05/2011  *RADIOLOGY REPORT*  Clinical Data:  For abscess.  Pain and fever.  CT PELVIS WITH CONTRAST  Technique:  Multidetector CT imaging of the pelvis was performed using the standard protocol following the bolus administration of intravenous contrast.  Contrast: 149m OMNIPAQUE IOHEXOL 300 MG/ML  SOLN  Comparison:  08/11/2010.  Findings:  A low density collection with peripherally hyperattenuating rim is seen in the right perirectal region, measuring approximately 6.3 x 6.1 x 5.1 cm (measured on coronal and sagittal reformatted images).  Collection appears grossly similar to 08/11/2010.  There are inflammatory changes in the surrounding fat.  Slight anterior mass effect on the rectum.  Visualized bowel is otherwise unremarkable.  Scattered atherosclerotic calcification of the arterial vasculature.  No pathologically enlarged lymph nodes.  Incisional scar is seen in the ventral midline.  No worrisome lytic or sclerotic lesions.  IMPRESSION:  Low attenuation perirectal fluid collection is grossly stable from 08/11/2010.  Given the stability, an abscess is felt to be less likely but is not excluded.  Original Report Authenticated By: Luretha Rued, M.D.    Anti-infectives: Anti-infectives     Start     Dose/Rate Route Frequency Ordered Stop   06/06/11 1000   vancomycin (VANCOCIN) IVPB 1000 mg/200 mL premix        1,000 mg 200 mL/hr over 60 Minutes Intravenous  Every 12 hours 06/05/11 2028     06/05/11 2200  piperacillin-tazobactam (ZOSYN) IVPB 3.375 g       3.375 g 12.5 mL/hr over 240 Minutes Intravenous 3 times per day 06/05/11 2028     06/05/11 2200   vancomycin (VANCOCIN) 2,000 mg in sodium chloride 0.9 % 500 mL IVPB        2,000 mg 250 mL/hr over 120 Minutes Intravenous  Once 06/05/11 2028 06/06/11 0013           Assessment/Plan  1. Perirectal abscess  Plan: 1. To OR today for I&D.   LOS: 1 day    Adler Alton E 06/06/2011

## 2011-06-07 ENCOUNTER — Encounter (HOSPITAL_COMMUNITY): Payer: Self-pay | Admitting: General Surgery

## 2011-06-07 LAB — GLUCOSE, CAPILLARY
Glucose-Capillary: 314 mg/dL — ABNORMAL HIGH (ref 70–99)
Glucose-Capillary: 217 mg/dL — ABNORMAL HIGH (ref 70–99)
Glucose-Capillary: 214 mg/dL — ABNORMAL HIGH (ref 70–99)

## 2011-06-07 MED ORDER — SODIUM CHLORIDE 0.9 % IJ SOLN: 3.0000 mL | INTRAMUSCULAR | Status: DC | PRN

## 2011-06-07 MED ORDER — TRAMADOL HCL 50 MG PO TABS
100.0000 mg | ORAL_TABLET | Freq: Four times a day (QID) | ORAL | Status: DC | PRN
  Administered 2011-06-07 (×2): 100 mg via ORAL
  Filled 2011-06-07 (×2): qty 2

## 2011-06-07 MED ORDER — AMOXICILLIN-POT CLAVULANATE 875-125 MG PO TABS
1.0000 | ORAL_TABLET | Freq: Two times a day (BID) | ORAL | Status: DC
  Administered 2011-06-07 – 2011-06-08 (×3): 1 via ORAL
  Filled 2011-06-07 (×4): qty 1

## 2011-06-07 NOTE — Progress Notes (Signed)
Inpatient Diabetes Program Recommendations  AACE/ADA: New Consensus Statement on Inpatient Glycemic Control (2009)  Target Ranges:  Prepandial:   less than 140 mg/dL      Peak postprandial:   less than 180 mg/dL (1-2 hours)      Critically ill patients:  140 - 180 mg/dL   Reason for Visit: 06/07/11  CBGs 253-314 mg/d    06/06/11  [phone] mg/dl  Inpatient Diabetes Program Recommendations Insulin - Basal: Start Lantus 15 units daily if CBGs continue greater than 180 mg/dl  Note:

## 2011-06-07 NOTE — Progress Notes (Signed)
[name] Progress Note 1 Day Post-Op  Subjective: Doing better, but needs to have dresing change done again later today.  Objective: Vital signs in last 24 hours: Temp:  [97.6 F (36.4 C)-99.5 F (37.5 C)] 98.2 F (36.8 C) (05/31 0535) Pulse Rate:  [84-95] 91  (05/31 0535) Resp:  [10-20] 16  (05/31 0535) BP: (104-152)/(57-92) 138/67 mmHg (05/31 0535) SpO2:  [94 %-100 %] 96 % (05/31 0535) Last BM Date: 06/04/11  Intake/Output from previous day: 05/30 0701 - 05/31 0700 In: 2277.5 [I.V.:1677.5; IV Piggyback:600] Out: 2135 [Urine:2125; Blood:10] Intake/Output this shift:    General: No distress currently  Lungs: Clear  Abd: Soft, benign  Extremities: No changes  Neuro: Intact  Rectal:  Needs dressing change done agaon later today, repaicked by Claiborne Billings earlier today.  Lab Results:  @LABLAST2 (wbc:2,hgb:2,hct:2,plt:2) BMET  Basename 06/06/11 0625 06/05/11 1307  NA 130* 131*  K 5.2* 4.1  CL 92* 95*  CO2 25 23  GLUCOSE 269* 268*  BUN 8 8  CREATININE 0.64 0.58  CALCIUM 9.2 9.3   PT/INR  Basename 06/06/11 0625  LABPROT 14.9  INR 1.15   ABG No results found for this basename: PHART:2,PCO2:2,PO2:2,HCO3:2 in the last 72 hours  Studies/Results: Dg Chest 2 View  06/06/2011  *RADIOLOGY REPORT*  Clinical Data: Preop, low grade fever  CHEST - 2 VIEW  Comparison: Portable chest x-ray of 10/19/2008  Findings: No definite active infiltrate or effusion is seen.  There is slight increased opacity overlying the anterior left sixth rib which probably is due to overlapping bony and vascular shadows, but attention to this area on follow-up chest x-ray is recommended. Mediastinal contours appear stable.  The heart is mildly enlarged and stable.  No bony abnormality is seen.  A lower anterior cervical spine fusion plate is present.  IMPRESSION:  1.  No active lung disease. 2.  Vague opacity at the left lung base may be superimposition of bony and vascular structures.  Recommend  attention to this area on follow-up chest x-ray.  Original Report Authenticated By: Joretta Bachelor, M.D.   Ct Pelvis W Contrast  06/05/2011  *RADIOLOGY REPORT*  Clinical Data:  For abscess.  Pain and fever.  CT PELVIS WITH CONTRAST  Technique:  Multidetector CT imaging of the pelvis was performed using the standard protocol following the bolus administration of intravenous contrast.  Contrast: 177m OMNIPAQUE IOHEXOL 300 MG/ML  SOLN  Comparison:  08/11/2010.  Findings:  A low density collection with peripherally hyperattenuating rim is seen in the right perirectal region, measuring approximately 6.3 x 6.1 x 5.1 cm (measured on coronal and sagittal reformatted images).  Collection appears grossly similar to 08/11/2010.  There are inflammatory changes in the surrounding fat.  Slight anterior mass effect on the rectum.  Visualized bowel is otherwise unremarkable.  Scattered atherosclerotic calcification of the arterial vasculature.  No pathologically enlarged lymph nodes.  Incisional scar is seen in the ventral midline.  No worrisome lytic or sclerotic lesions.  IMPRESSION:  Low attenuation perirectal fluid collection is grossly stable from 08/11/2010.  Given the stability, an abscess is felt to be less likely but is not excluded.  Original Report Authenticated By: MLuretha Rued M.D.    Anti-infectives: Anti-infectives     Start     Dose/Rate Route Frequency Ordered Stop   06/06/11 1000   vancomycin (VANCOCIN) IVPB 1000 mg/200 mL premix        1,000 mg 200 mL/hr over 60 Minutes Intravenous Every 12 hours 06/05/11 2028  06/05/11 2200  piperacillin-tazobactam (ZOSYN) IVPB 3.375 g       3.375 g 12.5 mL/hr over 240 Minutes Intravenous 3 times per day 06/05/11 2028     06/05/11 2200   vancomycin (VANCOCIN) 2,000 mg in sodium chloride 0.9 % 500 mL IVPB        2,000 mg 250 mL/hr over 120 Minutes Intravenous  Once 06/05/11 2028 06/06/11 0013          Assessment/Plan: s/p Procedure(s): EXAM  UNDER ANESTHESIA IRRIGATION AND DEBRIDEMENT PERIRECTAL ABSCESS Switch to oral antibiotics.  Start tramadol.  LOS: 2 days   Kathryne Eriksson. Dahlia Bailiff, MD, FACS [phone] [facility] Surgery 06/07/2011

## 2011-06-08 LAB — GLUCOSE, CAPILLARY: Glucose-Capillary: 267 mg/dL — ABNORMAL HIGH (ref 70–99)

## 2011-06-08 MED ORDER — AMOXICILLIN-POT CLAVULANATE 875-125 MG PO TABS: 1.0000 | ORAL_TABLET | Freq: Two times a day (BID) | ORAL | Status: AC

## 2011-06-08 NOTE — Discharge Summary (Signed)
  Patient ID: Matthew Lewis [id number] 55 y.o. 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 The last episode was when he had a horseshoe abscess last summer drained by Dr. Redmond Pulling. He has been doing well for a year without perirectal pain. This pain started around 4 days ago, and it is progressively getting worse. In the past, he has waited longer until it has become excruciating and draining. He has been having low grade temperatures up to around 99-100 degrees. He has not experienced any drainage yet. Ibuprofen has not been helping the pain. The pain and swelling is worse on the right side. It has gotten to where it is painful when he sits. Of note, he has gone off all of his medications. Exam on admission revealed a tender, erythematous inflamed abscess in the right posterior perianal area.   Hospital Course: on the day of admission the patient was taken to the operating room and underwent incision and drainage of a deep ischiorectal abscess in the right posterior position. The wound was packed. The following day the wound was redressed and he tolerated that well. He remained afebrile with resumption of diet, ambulation, and voiding without difficulty.  The wound was examined on June 1 and the gauze wick was removed. There was no bleeding or odor or purulence. The cellulitis had resolved.  The patient is discharged home on June 1. He was given a prescription for Augmentin. He stated he would use of ibuprofen or Tylenol for  pain as he was not hurting much.  . To return to the office to see Dr. Barry Dienes in 2 weeks.  Consults: None  Significant Diagnostic Studies:  Treatments: surgery: incision and drainage of ischiorectal abscess, right posterior  Disposition: Home  Patient Instructions:   Matthew Lewis, Matthew Lewis  Home Medication Instructions [id number]   Printed on:06/08/11 1751  Medication Information                    ibuprofen (ADVIL,MOTRIN) 200 MG tablet Take 400 mg by mouth every 6 (six) hours as needed. For pain             Activity: activity as tolerated Diet: low fat, low cholesterol diet Wound Care: as directed  Follow-up:  With Dr. Barry Dienes in 2 weeks.  Signed: Edsel Petrin. Dalbert Batman, M.D., FACS General and minimally invasive surgery Breast and Colorectal Surgery  06/08/2011, 9:58 AM

## 2011-06-08 NOTE — Progress Notes (Signed)
2 Days Post-Op  Subjective: Feels well. Minimal pain. Wants to go home. Dressing change last might, tolerated well. Afebrile. Vital signs normal. 2Objective: Vital signs in last 24 hours: Temp:  [98 F (36.7 C)-98.9 F (37.2 C)] 98.4 F (36.9 C) (06/01 0551) Pulse Rate:  [77-86] 77  (06/01 0551) Resp:  [18-19] 18  (06/01 0551) BP: (118-130)/(73-87) 121/87 mmHg (06/01 0551) SpO2:  [99 %-100 %] 100 % (06/01 0551) Last BM Date: 06/08/11  Intake/Output from previous day: 05/31 0701 - 06/01 0700 In: 960 [P.O.:960] Out: 300 [Urine:300] Intake/Output this shift:    Physical exam:  General: Looks well. Ambulating independently. Upbeat with good spirits. No distress. Mental status normal. Rectal: Which were removed from perianal wound. No cellulitis. No purulence. No odor. No bleeding. Not repacked.  Lab Results:  Results for orders placed during the hospital encounter of 06/05/11 (from the past 24 hour(s))  GLUCOSE, CAPILLARY     Status: Abnormal   Collection Time   06/07/11 11:23 AM      Component Value Range   Glucose-Capillary 314 (*) 70 - 99 (mg/dL)   Comment 1 Notify RN    GLUCOSE, CAPILLARY     Status: Abnormal   Collection Time   06/07/11  4:37 PM      Component Value Range   Glucose-Capillary 217 (*) 70 - 99 (mg/dL)   Comment 1 Notify RN    GLUCOSE, CAPILLARY     Status: Abnormal   Collection Time   06/07/11 10:28 PM      Component Value Range   Glucose-Capillary 214 (*) 70 - 99 (mg/dL)  GLUCOSE, CAPILLARY     Status: Abnormal   Collection Time   06/08/11  7:35 AM      Component Value Range   Glucose-Capillary 267 (*) 70 - 99 (mg/dL)   Comment 1 Notify RN       Studies/Results: @RISRSLT24 @     . amoxicillin-clavulanate  1 tablet Oral Q12H  . docusate sodium  100 mg Oral BID  . insulin aspart  0-15 Units Subcutaneous TID WC  . insulin aspart  4 Units Subcutaneous TID WC  . metoprolol tartrate  25 mg Oral BID  . DISCONTD: antiseptic oral rinse  15 mL Mouth  Rinse q12n4p  . DISCONTD: chlorhexidine  15 mL Mouth Rinse BID  . DISCONTD: piperacillin-tazobactam (ZOSYN)  IV  3.375 g Intravenous Q8H  . DISCONTD: vancomycin  1,000 mg Intravenous Q12H     Assessment/Plan: s/p Procedure(s): EXAM UNDER ANESTHESIA IRRIGATION AND DEBRIDEMENT PERIRECTAL ABSCESS  POD #2. Doing well. Discharge home today Sitz baths 3-4 times daily He does not want any prescription for pain medicine Prescription for Augmentin given for 10 days See Dr. Barry Dienes in 2 weeks.   Patient Active Hospital Problem List: No active hospital problems.   LOS: 3 days    Jaslyne Beeck M 06/08/2011  . .prob

## 2011-06-08 NOTE — Discharge Instructions (Signed)
Peri-Rectal Abscess Your caregiver has diagnosed you as having a peri-rectal abscess. This is an infected area near the rectum that is filled with pus. If the abscess is near the surface of the skin, your caregiver may open (incise) the area and drain the pus. HOME CARE INSTRUCTIONS   If your abscess was opened up and drained. A small piece of gauze may be placed in the opening so that it can drain. Do not remove the gauze unless directed by your caregiver.   A loose dressing may be placed over the abscess site. Change the dressing as often as necessary to keep it clean and dry.   After the drain is removed, the area may be washed with a gentle antiseptic (soap) four times per day.   A warm sitz bath, warm packs or heating pad may be used for pain relief, taking care not to burn yourself.   Return for a wound check in 1 day or as directed.   An "inflatable doughnut" may be used for sitting with added comfort. These can be purchased at a drugstore or medical supply house.   To reduce pain and straining with bowel movements, eat a high fiber diet with plenty of fruits and vegetables. Use stool softeners as recommended by your caregiver. This is especially important if narcotic type pain medications were prescribed as these may cause marked constipation.   Only take over-the-counter or prescription medicines for pain, discomfort, or fever as directed by your caregiver.  SEEK IMMEDIATE MEDICAL CARE IF:   You have increasing pain that is not controlled by medication.   There is increased inflammation (redness), swelling, bleeding, or drainage from the area.   An oral temperature above 102 F (38.9 C) develops.   You develop chills or generalized malaise (feel lethargic or feel "washed out").   You develop any new symptoms (problems) you feel may be related to your present problem.  Document Released: 12/22/1999 Document Revised: 12/13/2010 Document Reviewed: 12/22/2007 [employer] Patient  Information 2012 [place].

## 2011-06-09 LAB — CULTURE, ROUTINE-ABSCESS: Gram Stain: NONE SEEN

## 2011-06-11 LAB — ANAEROBIC CULTURE

## 2011-06-14 ENCOUNTER — Telehealth (INDEPENDENT_AMBULATORY_CARE_PROVIDER_SITE_OTHER): Payer: Self-pay

## 2011-06-14 NOTE — Telephone Encounter (Signed)
LMOV with pt's post op appointment on 06/28/11 at 8:45 a.m.

## 2011-06-28 ENCOUNTER — Encounter (INDEPENDENT_AMBULATORY_CARE_PROVIDER_SITE_OTHER): Payer: BC Managed Care – PPO | Admitting: General Surgery

## 2011-07-02 ENCOUNTER — Encounter (INDEPENDENT_AMBULATORY_CARE_PROVIDER_SITE_OTHER): Payer: Self-pay | Admitting: General Surgery

## 2011-08-15 ENCOUNTER — Emergency Department (HOSPITAL_COMMUNITY)
Admission: EM | Admit: 2011-08-15 | Discharge: 2011-08-19 | Disposition: A | Discharge status: Other Acute Inpatient Hospital | Payer: BC Managed Care – PPO | Attending: Emergency Medicine | Admitting: Emergency Medicine

## 2011-08-15 ENCOUNTER — Encounter (HOSPITAL_COMMUNITY): Payer: Self-pay

## 2011-08-15 DIAGNOSIS — F329 Major depressive disorder, single episode, unspecified: Secondary | ICD-10-CM | POA: Insufficient documentation

## 2011-08-15 DIAGNOSIS — E119 Type 2 diabetes mellitus without complications: Secondary | ICD-10-CM | POA: Insufficient documentation

## 2011-08-15 DIAGNOSIS — Z046 Encounter for general psychiatric examination, requested by authority: Secondary | ICD-10-CM | POA: Insufficient documentation

## 2011-08-15 DIAGNOSIS — Z8739 Personal history of other diseases of the musculoskeletal system and connective tissue: Secondary | ICD-10-CM | POA: Insufficient documentation

## 2011-08-15 DIAGNOSIS — Z87891 Personal history of nicotine dependence: Secondary | ICD-10-CM | POA: Insufficient documentation

## 2011-08-15 DIAGNOSIS — F3289 Other specified depressive episodes: Secondary | ICD-10-CM | POA: Insufficient documentation

## 2011-08-15 LAB — CBC WITH DIFFERENTIAL/PLATELET
Basophils Absolute: 0 10*3/uL (ref 0.0–0.1)
Basophils Relative: 0 % (ref 0–1)
MCHC: 36.2 g/dL — ABNORMAL HIGH (ref 30.0–36.0)
Monocytes Absolute: 0.6 10*3/uL (ref 0.1–1.0)
Neutro Abs: 5.8 10*3/uL (ref 1.7–7.7)
Neutrophils Relative %: 64 % (ref 43–77)
Platelets: 157 10*3/uL (ref 150–400)
RDW: 12.7 % (ref 11.5–15.5)

## 2011-08-15 LAB — COMPREHENSIVE METABOLIC PANEL
AST: 54 U/L — ABNORMAL HIGH (ref 0–37)
Albumin: 4 g/dL (ref 3.5–5.2)
Chloride: 96 mEq/L (ref 96–112)
Creatinine, Ser: 0.56 mg/dL (ref 0.50–1.35)
Potassium: 3.9 mEq/L (ref 3.5–5.1)
Sodium: 132 mEq/L — ABNORMAL LOW (ref 135–145)
Total Bilirubin: 0.6 mg/dL (ref 0.3–1.2)

## 2011-08-15 LAB — URINALYSIS, ROUTINE W REFLEX MICROSCOPIC
Bilirubin Urine: NEGATIVE
Hgb urine dipstick: NEGATIVE
Nitrite: NEGATIVE
Protein, ur: NEGATIVE mg/dL
Specific Gravity, Urine: 1.01 (ref 1.005–1.030)
Urobilinogen, UA: 0.2 mg/dL (ref 0.0–1.0)

## 2011-08-15 LAB — RAPID URINE DRUG SCREEN, HOSP PERFORMED
Barbiturates: NOT DETECTED
Cocaine: NOT DETECTED
Tetrahydrocannabinol: POSITIVE — AB

## 2011-08-15 MED ORDER — AMLODIPINE BESYLATE 5 MG PO TABS
5.0000 mg | ORAL_TABLET | Freq: Every day | ORAL | Status: DC
  Administered 2011-08-15 – 2011-08-19 (×5): 5 mg via ORAL
  Filled 2011-08-15 (×5): qty 1

## 2011-08-15 MED ORDER — ZOLPIDEM TARTRATE 5 MG PO TABS
5.0000 mg | ORAL_TABLET | Freq: Every evening | ORAL | Status: DC | PRN
  Administered 2011-08-15 – 2011-08-18 (×4): 5 mg via ORAL
  Filled 2011-08-15 (×4): qty 1

## 2011-08-15 MED ORDER — PROMETHAZINE HCL 50 MG PO TABS
50.0000 mg | ORAL_TABLET | Freq: Three times a day (TID) | ORAL | Status: DC | PRN
  Administered 2011-08-16 – 2011-08-18 (×2): 50 mg via ORAL
  Filled 2011-08-15 (×3): qty 1

## 2011-08-15 MED ORDER — METFORMIN HCL 500 MG PO TABS
500.0000 mg | ORAL_TABLET | Freq: Two times a day (BID) | ORAL | Status: DC
  Administered 2011-08-16: 500 mg via ORAL
  Filled 2011-08-15: qty 1

## 2011-08-15 MED ORDER — LORAZEPAM 1 MG PO TABS
1.0000 mg | ORAL_TABLET | Freq: Four times a day (QID) | ORAL | Status: DC | PRN
  Administered 2011-08-16 – 2011-08-18 (×3): 1 mg via ORAL
  Filled 2011-08-15 (×3): qty 1

## 2011-08-15 MED ORDER — ACETAMINOPHEN 325 MG PO TABS
650.0000 mg | ORAL_TABLET | ORAL | Status: DC | PRN
  Administered 2011-08-16: 650 mg via ORAL
  Filled 2011-08-15: qty 2

## 2011-08-15 MED ORDER — IBUPROFEN 200 MG PO TABS
400.0000 mg | ORAL_TABLET | Freq: Four times a day (QID) | ORAL | Status: DC | PRN
  Administered 2011-08-15 – 2011-08-18 (×5): 400 mg via ORAL
  Filled 2011-08-15 (×5): qty 2

## 2011-08-15 MED ORDER — IBUPROFEN 200 MG PO TABS: 600.0000 mg | ORAL_TABLET | Freq: Three times a day (TID) | ORAL | Status: DC | PRN

## 2011-08-15 NOTE — ED Notes (Signed)
Pt's wallet was given to wife to take home.

## 2011-08-15 NOTE — ED Notes (Signed)
All belongings given to his wife.

## 2011-08-15 NOTE — ED Notes (Signed)
New sitter at bedside. Pt resting and talking with sitter.  RN aware of sitter change.
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
## 2011-08-15 NOTE — ED Notes (Signed)
Per spouse pt has a hx of OxyContin and valium abuse, pt reports he quit "cold [place]" over a year ago.
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
## 2011-08-15 NOTE — ED Notes (Signed)
Pt tear full and refusing to talk.
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
## 2011-08-16 LAB — GLUCOSE, CAPILLARY
Glucose-Capillary: 291 mg/dL — ABNORMAL HIGH (ref 70–99)
Glucose-Capillary: 313 mg/dL — ABNORMAL HIGH (ref 70–99)

## 2011-08-16 MED ORDER — METFORMIN HCL 500 MG PO TABS
1000.0000 mg | ORAL_TABLET | Freq: Two times a day (BID) | ORAL | Status: DC
  Administered 2011-08-16 – 2011-08-19 (×6): 1000 mg via ORAL
  Filled 2011-08-16 (×6): qty 2

## 2011-08-16 MED ORDER — INSULIN ASPART 100 UNIT/ML ~~LOC~~ SOLN
0.0000 [IU] | Freq: Three times a day (TID) | SUBCUTANEOUS | Status: DC
  Administered 2011-08-16: 8 [IU] via SUBCUTANEOUS
  Administered 2011-08-17 (×2): 5 [IU] via SUBCUTANEOUS
  Administered 2011-08-17: 8 [IU] via SUBCUTANEOUS
  Administered 2011-08-18 (×2): 5 [IU] via SUBCUTANEOUS
  Administered 2011-08-18 – 2011-08-19 (×3): 3 [IU] via SUBCUTANEOUS
  Filled 2011-08-16 (×9): qty 1

## 2011-08-16 NOTE — ED Notes (Signed)
Relieved sitter for break. 

## 2011-08-16 NOTE — ED Notes (Signed)
Carb Mod tray ordered spoke with Darl Pikes

## 2011-08-16 NOTE — BHH Counselor (Signed)
Pt submitted for admission to [facility]. Marquita Palms McMurren, AC confirms there are no adult beds available. Verne Spurr, PA reviewed clinical information declined Pt at [facility] due to not meeting inpatient criteria.  Harlin Rain Patsy [place], LPC

## 2011-08-16 NOTE — ED Notes (Signed)
IVC served for the pt.

## 2011-08-16 NOTE — ED Notes (Signed)
Report received, assumed care.  

## 2011-08-16 NOTE — ED Notes (Signed)
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 Alert, NAD, calm, interactive, polite, cooperative, mentions chronic R shoulder chest R arm pain, describes as nerve pain and musculoskeletal pain, 10/10, agreeable to have wife visit, wife at [facility].

## 2011-08-16 NOTE — ED Notes (Signed)
Notified RN of CBG 291

## 2011-08-16 NOTE — ED Notes (Signed)
Pt slept well over night.Pt says he feels better and more relaxed . patient smiles durning conversation .

## 2011-08-16 NOTE — ED Notes (Signed)
Pt sitting on edge of bed crying, wife has left (pt describes as "not a good interaction"), sitter at [facility].

## 2011-08-16 NOTE — ED Notes (Signed)
Carb Mod ordered spoke with [place]

## 2011-08-16 NOTE — BHH Counselor (Signed)
Pt has been referred to the following hospitals through out the day: [facility], [facility] and [place]. Counselor has called each referral to confirm they received the information. Waiting on reply. The following hospitals have no availability today: [place], [facility], [place], [facility] and [facility].

## 2011-08-16 NOTE — ED Notes (Signed)
meds given for pain, sadness (crying), hopelessness verbalizations, SI verbalizations, mild nausea and sleep. cbg 313, EDP aware, no HS DM meds ordered, received metformin 1000mg  at 1715. 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 Calmer, "feels better", talking with sitter.

## 2011-08-16 NOTE — ED Notes (Signed)
Pt's CBG is 313. RN Cletis [place] notified.

## 2011-08-17 LAB — GLUCOSE, CAPILLARY: Glucose-Capillary: 242 mg/dL — ABNORMAL HIGH (ref 70–99)

## 2011-08-17 MED ORDER — CITALOPRAM HYDROBROMIDE 20 MG PO TABS
20.0000 mg | ORAL_TABLET | Freq: Every day | ORAL | Status: DC
  Administered 2011-08-17 – 2011-08-19 (×3): 20 mg via ORAL
  Filled 2011-08-17 (×5): qty 1

## 2011-08-17 NOTE — ED Notes (Signed)
Patient is resting comfortably. Sitter bedside.

## 2011-08-17 NOTE — ED Notes (Signed)
Patient is resting comfortably. Sitter at bedside.  

## 2011-08-17 NOTE — ED Notes (Signed)
[phone]-------Regina Julian Reil, pt's wife.

## 2011-08-17 NOTE — ED Notes (Signed)
telepsych doctor called and set up system in room!

## 2011-08-17 NOTE — ED Notes (Signed)
Dinner ordered@1600 
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
## 2011-08-17 NOTE — ED Provider Notes (Signed)
Pt was commited involuntarily for workup on suicidal ideations. Tele psych consulted, and they are recommending outpatient management with celexa 20 mg po q daily. Behavioral team not comfortable with the discharge plan - and wants to continue with the pursuit for inpatient admission, especially since patient is not signing a safety contract without any hesitation. Will continue to monitor in the ED.   Varney Biles, MD 08/17/11 [phone]

## 2011-08-18 LAB — GLUCOSE, CAPILLARY: Glucose-Capillary: 181 mg/dL — ABNORMAL HIGH (ref 70–99)

## 2011-08-18 NOTE — BHH Counselor (Signed)
Cm called [facility] @ [phone] and spoke with Maralyn Sago who states that Matthew Lewis is still on their waiting list ; states that we can continue to call and check on his status after another 24 hours has passed or a member of their staff will call if a bed become available before 08-19-11

## 2011-08-18 NOTE — ED Provider Notes (Signed)
Patient is currently waiting for placement. His blood sugars have been stable between 250 and below. Patient has no complaints this morning.  Blanchie Dessert, MD 08/18/11 [phone]

## 2011-08-19 NOTE — ED Notes (Signed)
Patient accepted at [facility] and [facility] called and patient is to be transported today after 1500. EDP advised.

## 2011-08-19 NOTE — BH Assessment (Signed)
Assessment Note  Update:  Received a call from [facility] @ [phone] stating pt still on [facility] wait list.  Then, received a call from [facility] @ [phone] stating pt accepted to Dr. Merlene Morse at [facility] and that pt's bed was ready and he could be transported.  The number to call report that was given was [phone].  Updated EDP [place] and ED staff.  ED staff to arrange transport via [facility], as pt is IVC.  Updated assessment, assessment disposition and faxed to [facility] to log.     Disposition:  Disposition Disposition of Patient: Inpatient treatment program Type of inpatient treatment program: Adult Patient referred to: Other (Comment) (Pt accepted [facility])  On Site Evaluation by:   Reviewed with Physician:  Marchelle Folks 08/19/2011 11:04 AM

## 2011-09-04 NOTE — ED Provider Notes (Signed)
History     CSN: [id number]  Arrival date & time 06/05/11  1148   First MD Initiated Contact with Patient 06/05/11 1213      Chief Complaint  Patient presents with  . Abscess    (Consider location/radiation/quality/duration/timing/severity/associated sxs/prior treatment) HPI Patient presents to the ER with rectal pain and abscess. The patient states that he called his surgeon and they advised him to come to the ER. The patient denies diarrhea, abd pain, weakness, fever, shortness or breath, rectal bleeding or back pain. The patient states that he has not taken anything for his pain. The patient states that sitting and palpation make the pain worse.  Past Medical History  Diagnosis Date  . Fistula, anal     multiple  . Abscess of anal and rectal regions     horse shoe abscess  . Poor circulation   . Fatigue   . Sleep deprivation   . Colon cancer 05/2005  . Type II diabetes mellitus   . DM type 2 with diabetic peripheral neuropathy     left foot  . Arthritis     "everywhere; especially in my spine"  . PTSD (post-traumatic stress disorder)     "severe"    Past Surgical History  Procedure Date  . Hemicolectomy 06/06/2005    left  . Incision and drainage perirectal abscess 03/30/2005  . Examination under anesthesia 03/22/2005    fistula  . Incision and drainage perirectal abscess 12/13/2004  . Incision and drainage perirectal abscess 07/14/2001  . Anal fistulectomy 12/13/1998  . Incision and drainage perirectal abscess 08/12/2010    horseshoe abscess; Dr Andrey Campanile  . Lumbar disc surgery 1987  . Thoracic discectomy [id number]  . Anterior fusion cervical spine 2010    "triple"; "did 2 surgeries on the same day in 2010 due to complications"  . Abscess drainage     "probably 15-20 so far at least; rectal"  . Coronary angioplasty 2001  . Detached muscle 2010    right chest; "after cervical fusion complications"  . Elbow surgery 2007    "cut out part of a muscle"; left  . Percutaneous  pinning phalanx fracture of hand ~ 2008    "plates in 2 places"; right  . Examination under anesthesia 06/06/2011    Procedure: EXAM UNDER ANESTHESIA;  Surgeon: Almond Lint, MD;  Location: [facility] OR;  Service: General;  Laterality: N/A;  . Incision and drainage perirectal abscess 06/06/2011    Procedure: IRRIGATION AND DEBRIDEMENT PERIRECTAL ABSCESS;  Surgeon: Almond Lint, MD;  Location: [facility] OR;  Service: General;  Laterality: N/A;    Family History  Problem Relation Age of Onset  . Heart disease Father   . Heart disease Mother   . Cancer Mother   . Heart disease Brother   . Cancer Sister     History  Substance Use Topics  . Smoking status: Former Smoker -- 1.0 packs/day for 10 years    Types: Cigarettes    Quit date: 01/08/1991  . Smokeless tobacco: Never Used  . Alcohol Use: Yes     06/05/11 "drank enough when I was younger to last me my whole life; don't remember when I had my last drink, maybe 2012"      Review of Systems All other systems negative except as documented in the HPI. All pertinent positives and negatives as reviewed in the HPI.  Allergies  Review of patient's allergies indicates no known allergies.  Home Medications   Current Outpatient Rx  Name Route Sig  Dispense Refill  . IBUPROFEN 200 MG PO TABS Oral Take 400 mg by mouth every 6 (six) hours as needed. For pain    . PROMETHAZINE HCL 50 MG PO TABS Oral Take 1 tablet by mouth Three times daily as needed. For pain related to nerve damage      BP 121/87  Pulse 77  Temp 98.4 F (36.9 C) (Oral)  Resp 18  Ht 5\' 11"  (1.803 m)  Wt 246 lb 3.2 oz (111.676 kg)  BMI 34.34 kg/m2  SpO2 100%  Physical Exam  Nursing note and vitals reviewed. Constitutional: He appears well-developed and well-nourished.  HENT:  Head: Normocephalic and atraumatic.  Cardiovascular: Normal rate, regular rhythm and normal heart sounds.   Pulmonary/Chest: Effort normal and breath sounds normal.  Genitourinary:       Patient has  rectal swelling and pain on palpation.   Skin: Skin is warm and dry. No rash noted.    ED Course  Procedures (including critical care time)  Labs Reviewed  CBC - Abnormal; Notable for the following:    WBC 12.5 (*)     All other components within normal limits  DIFFERENTIAL - Abnormal; Notable for the following:    Neutrophils Relative 78 (*)     Neutro Abs 9.7 (*)     Monocytes Absolute 1.1 (*)     All other components within normal limits  BASIC METABOLIC PANEL - Abnormal; Notable for the following:    Sodium 131 (*)     Chloride 95 (*)     Glucose, Bld 268 (*)     All other components within normal limits  HEMOGLOBIN A1C - Abnormal; Notable for the following:    Hemoglobin A1C 9.8 (*)     Mean Plasma Glucose 235 (*)     All other components within normal limits  BASIC METABOLIC PANEL - Abnormal; Notable for the following:    Sodium 130 (*)     Potassium 5.2 (*)  SLIGHT HEMOLYSIS   Chloride 92 (*)     Glucose, Bld 269 (*)     All other components within normal limits  PHOSPHORUS - Abnormal; Notable for the following:    Phosphorus 2.2 (*)     All other components within normal limits  GLUCOSE, CAPILLARY - Abnormal; Notable for the following:    Glucose-Capillary 223 (*)     All other components within normal limits  GLUCOSE, CAPILLARY - Abnormal; Notable for the following:    Glucose-Capillary 248 (*)     All other components within normal limits  GLUCOSE, CAPILLARY - Abnormal; Notable for the following:    Glucose-Capillary 250 (*)     All other components within normal limits  GLUCOSE, CAPILLARY - Abnormal; Notable for the following:    Glucose-Capillary 238 (*)     All other components within normal limits  GLUCOSE, CAPILLARY - Abnormal; Notable for the following:    Glucose-Capillary 227 (*)     All other components within normal limits  GLUCOSE, CAPILLARY - Abnormal; Notable for the following:    Glucose-Capillary 223 (*)     All other components within  normal limits  GLUCOSE, CAPILLARY - Abnormal; Notable for the following:    Glucose-Capillary 253 (*)     All other components within normal limits  GLUCOSE, CAPILLARY - Abnormal; Notable for the following:    Glucose-Capillary 314 (*)     All other components within normal limits  GLUCOSE, CAPILLARY - Abnormal; Notable for the following:  Glucose-Capillary 217 (*)     All other components within normal limits  GLUCOSE, CAPILLARY - Abnormal; Notable for the following:    Glucose-Capillary 214 (*)     All other components within normal limits  GLUCOSE, CAPILLARY - Abnormal; Notable for the following:    Glucose-Capillary 267 (*)     All other components within normal limits  MAGNESIUM  PROTIME-INR  SURGICAL PCR SCREEN  ANAEROBIC CULTURE  CULTURE, ROUTINE-ABSCESS  LAB REPORT - SCANNED   Surgery will be down to see the patient. The patient is stable at this time.   1. Perirectal abscess       MDM  MDM Reviewed: vitals and nursing note Interpretation: labs and CT scan Consults: general surgery            Carlyle Dolly, PA-C 09/09/11 0700

## 2011-09-11 NOTE — ED Provider Notes (Signed)
Medical screening examination/treatment/procedure(s) were performed by non-physician practitioner and as supervising physician I was immediately available for consultation/collaboration.   Blanchie Dessert, MD 09/11/11 1517

## 2012-05-15 ENCOUNTER — Emergency Department (HOSPITAL_COMMUNITY)
Admission: EM | Admit: 2012-05-15 | Discharge: 2012-05-15 | Disposition: A | Discharge status: Home / Self Care | Payer: BC Managed Care – PPO | Attending: Emergency Medicine | Admitting: Emergency Medicine

## 2012-05-15 DIAGNOSIS — S0502XA Injury of conjunctiva and corneal abrasion without foreign body, left eye, initial encounter: Secondary | ICD-10-CM

## 2012-05-15 DIAGNOSIS — G909 Disorder of the autonomic nervous system, unspecified: Secondary | ICD-10-CM | POA: Insufficient documentation

## 2012-05-15 DIAGNOSIS — H579 Unspecified disorder of eye and adnexa: Secondary | ICD-10-CM | POA: Insufficient documentation

## 2012-05-15 DIAGNOSIS — Z9861 Coronary angioplasty status: Secondary | ICD-10-CM | POA: Insufficient documentation

## 2012-05-15 DIAGNOSIS — S058X9A Other injuries of unspecified eye and orbit, initial encounter: Secondary | ICD-10-CM | POA: Insufficient documentation

## 2012-05-15 DIAGNOSIS — Z79899 Other long term (current) drug therapy: Secondary | ICD-10-CM | POA: Insufficient documentation

## 2012-05-15 DIAGNOSIS — Z8659 Personal history of other mental and behavioral disorders: Secondary | ICD-10-CM | POA: Insufficient documentation

## 2012-05-15 DIAGNOSIS — Z7982 Long term (current) use of aspirin: Secondary | ICD-10-CM | POA: Insufficient documentation

## 2012-05-15 DIAGNOSIS — Z87891 Personal history of nicotine dependence: Secondary | ICD-10-CM | POA: Insufficient documentation

## 2012-05-15 DIAGNOSIS — H5789 Other specified disorders of eye and adnexa: Secondary | ICD-10-CM | POA: Insufficient documentation

## 2012-05-15 DIAGNOSIS — Z8719 Personal history of other diseases of the digestive system: Secondary | ICD-10-CM | POA: Insufficient documentation

## 2012-05-15 DIAGNOSIS — Z8679 Personal history of other diseases of the circulatory system: Secondary | ICD-10-CM | POA: Insufficient documentation

## 2012-05-15 DIAGNOSIS — H53149 Visual discomfort, unspecified: Secondary | ICD-10-CM | POA: Insufficient documentation

## 2012-05-15 DIAGNOSIS — Z8739 Personal history of other diseases of the musculoskeletal system and connective tissue: Secondary | ICD-10-CM | POA: Insufficient documentation

## 2012-05-15 DIAGNOSIS — E1149 Type 2 diabetes mellitus with other diabetic neurological complication: Secondary | ICD-10-CM | POA: Insufficient documentation

## 2012-05-15 DIAGNOSIS — Y929 Unspecified place or not applicable: Secondary | ICD-10-CM | POA: Insufficient documentation

## 2012-05-15 DIAGNOSIS — X58XXXA Exposure to other specified factors, initial encounter: Secondary | ICD-10-CM | POA: Insufficient documentation

## 2012-05-15 DIAGNOSIS — Y9389 Activity, other specified: Secondary | ICD-10-CM | POA: Insufficient documentation

## 2012-05-15 MED ORDER — CIPROFLOXACIN HCL 0.3 % OP SOLN: 1.0000 [drp] | Freq: Four times a day (QID) | OPHTHALMIC | Status: DC

## 2012-05-15 MED ORDER — TETRACAINE HCL 0.5 % OP SOLN
2.0000 [drp] | Freq: Once | OPHTHALMIC | Status: AC
  Administered 2012-05-15: 2 [drp] via OPHTHALMIC
  Filled 2012-05-15: qty 2

## 2012-05-15 NOTE — ED Provider Notes (Signed)
History    This chart was scribed for Magnus Sinning, PA working with Gerhard Munch, MD by ED Scribe, Burman Nieves. This patient was seen in room WTR6/WTR6 and the patient's care was started at 11:18 PM.   CSN: [id number]  Arrival date & time 05/15/12  2242   First MD Initiated Contact with Patient 05/15/12 2318      Chief Complaint  Patient presents with  . Foreign Body in Eye    (Consider location/radiation/quality/duration/timing/severity/associated sxs/prior treatment) Patient is a 57 y.o. male presenting with foreign body in eye. The history is provided by the patient. No language interpreter was used.  Foreign Body in Eye This is a new problem. The problem occurs constantly. The problem has not changed since onset.Nothing relieves the symptoms.  Foreign Body in Eye This is a new problem. The problem occurs constantly. The problem has not changed since onset.  HPI Comments: Matthew Lewis is a 57 y.o. male who presents to the Emergency Department complaining of moderate constant irritation to pt's left eye onset earlier today. He denies pain, but states that he feels that there is a foreign body in the eye.  He states earlier he thought it was just a hair but was unable to see anything and the foreign body sensation has been persistent.   His left eye has been watering for the past couple of hours, but no purulent discharge.   He also reports some photophobia.  He denies any blurred vision or diplopia.  Denies headache.  Denies redness of the eye.  Pt denies fever, chills, cough, nausea, vomiting, diarrhea, SOB, weakness, and any other associated symptoms. Pt's current PCP is Dr. Nicholos Johns.   Past Medical History  Diagnosis Date  . Fistula, anal     multiple  . Abscess of anal and rectal regions     horse shoe abscess  . Poor circulation   . Fatigue   . Sleep deprivation   . Colon cancer 05/2005  . Type II diabetes mellitus   . DM type 2 with diabetic peripheral  neuropathy     left foot  . Arthritis     "everywhere; especially in my spine"  . PTSD (post-traumatic stress disorder)     "severe"    Past Surgical History  Procedure Laterality Date  . Hemicolectomy  06/06/2005    left  . Incision and drainage perirectal abscess  03/30/2005  . Examination under anesthesia  03/22/2005    fistula  . Incision and drainage perirectal abscess  12/13/2004  . Incision and drainage perirectal abscess  07/14/2001  . Anal fistulectomy  12/13/1998  . Incision and drainage perirectal abscess  08/12/2010    horseshoe abscess; Dr Andrey Campanile  . Lumbar disc surgery  1987  . Thoracic discectomy  [id number]  . Anterior fusion cervical spine  2010    "triple"; "did 2 surgeries on the same day in 2010 due to complications"  . Abscess drainage      "probably 15-20 so far at least; rectal"  . Coronary angioplasty  2001  . Detached muscle  2010    right chest; "after cervical fusion complications"  . Elbow surgery  2007    "cut out part of a muscle"; left  . Percutaneous pinning phalanx fracture of hand  ~ 2008    "plates in 2 places"; right  . Examination under anesthesia  06/06/2011    Procedure: EXAM UNDER ANESTHESIA;  Surgeon: Almond Lint, MD;  Location: [facility] OR;  Service: General;  Laterality: N/A;  . Incision and drainage perirectal abscess  06/06/2011    Procedure: IRRIGATION AND DEBRIDEMENT PERIRECTAL ABSCESS;  Surgeon: Almond Lint, MD;  Location: [facility] OR;  Service: General;  Laterality: N/A;    Family History  Problem Relation Age of Onset  . Heart disease Father   . Heart disease Mother   . Cancer Mother   . Heart disease Brother   . Cancer Sister     History  Substance Use Topics  . Smoking status: Former Smoker -- 1.00 packs/day for 10 years    Types: Cigarettes    Quit date: 01/08/1991  . Smokeless tobacco: Never Used  . Alcohol Use: Yes     Comment: 06/05/11 "drank enough when I was younger to last me my whole life; don't remember when I had my last  drink, maybe 2012"      Review of Systems  Eyes: Positive for pain, discharge, redness and itching.  All other systems reviewed and are negative.    Allergies  Other  Home Medications   Current Outpatient Rx  Name  Route  Sig  Dispense  Refill  . amLODipine (NORVASC) 10 MG tablet   Oral   Take 10 mg by mouth daily.         Marland Kitchen aspirin EC 81 MG tablet   Oral   Take 81 mg by mouth daily.         Marland Kitchen gabapentin (NEURONTIN) 400 MG capsule   Oral   Take 400 mg by mouth 2 (two) times daily.         Marland Kitchen glipiZIDE (GLUCOTROL) 5 MG tablet   Oral   Take 5 mg by mouth 2 (two) times daily before a meal.         . lisinopril (PRINIVIL,ZESTRIL) 40 MG tablet   Oral   Take 40 mg by mouth daily.         . metFORMIN (GLUCOPHAGE) 1000 MG tablet   Oral   Take 1,000 mg by mouth 2 (two) times daily with a meal.           BP 128/79  Pulse 81  Resp 15  SpO2 100%  Physical Exam  Nursing note and vitals reviewed. Constitutional: He appears well-developed and well-nourished.  HENT:  Head: Normocephalic and atraumatic.  Mouth/Throat: Oropharynx is clear and moist.  Eyes: Conjunctivae, EOM and lids are normal. Pupils are equal, round, and reactive to light. No foreign bodies found. Left eye exhibits no discharge and no exudate. No foreign body present in the left eye. Left conjunctiva is not injected. Left conjunctiva has no hemorrhage.  Slit lamp exam:      The left eye shows corneal abrasion and fluorescein uptake. The left eye shows no corneal ulcer, no foreign body and no hyphema.  Neck: Normal range of motion. Neck supple.  Cardiovascular: Normal rate, regular rhythm and normal heart sounds.   Pulmonary/Chest: Effort normal and breath sounds normal. He has no wheezes.  Musculoskeletal: Normal range of motion.  Neurological: He is alert.  Skin: Skin is warm and dry.  Psychiatric: He has a normal mood and affect. His behavior is normal.    ED Course  Procedures  (including critical care time) DIAGNOSTIC STUDIES: Oxygen Saturation is 100% on room air, normal by my interpretation.    COORDINATION OF CARE:  11:37 PM Discussed ED treatment with pt and pt agrees.    Labs Reviewed - No data to display No results found.   No diagnosis  found.    MDM  Patient presenting with a foreign body sensation of his eye.  No signs of eye infection.  Normal visual acuity.  Exam shows corneal abrasion.  Patient discharged home with antibiotic drops and referral to Ophthalmology.  Return precautions discussed.  I personally performed the services described in this documentation, which was scribed in my presence. The recorded information has been reviewed and is accurate.    Pascal Lux [place], PA-C 05/16/12 [phone]

## 2012-05-15 NOTE — ED Notes (Signed)
Pt verbalizes understanding 

## 2012-05-15 NOTE — ED Notes (Signed)
Pt present with c/o left eye pain.  Pt reports "feels like something is in it"  Pt denies trauma

## 2012-05-16 NOTE — ED Provider Notes (Signed)
  Medical screening examination/treatment/procedure(s) were performed by non-physician practitioner and as supervising physician I was immediately available for consultation/collaboration.    Carmin Muskrat, MD 05/16/12 [phone]

## 2012-09-17 ENCOUNTER — Encounter: Payer: Self-pay | Admitting: Gastroenterology

## 2012-11-04 ENCOUNTER — Encounter: Payer: Self-pay | Admitting: Gastroenterology

## 2012-11-30 ENCOUNTER — Encounter (HOSPITAL_COMMUNITY): Payer: Self-pay | Admitting: Emergency Medicine

## 2012-11-30 ENCOUNTER — Observation Stay (HOSPITAL_COMMUNITY)
Admission: EM | Admit: 2012-11-30 | Discharge: 2012-12-02 | Disposition: A | Discharge status: Home / Self Care | Payer: BC Managed Care – PPO | Attending: Internal Medicine | Admitting: Internal Medicine

## 2012-11-30 ENCOUNTER — Emergency Department (HOSPITAL_COMMUNITY): Payer: BC Managed Care – PPO

## 2012-11-30 DIAGNOSIS — E1149 Type 2 diabetes mellitus with other diabetic neurological complication: Secondary | ICD-10-CM | POA: Insufficient documentation

## 2012-11-30 DIAGNOSIS — F1911 Other psychoactive substance abuse, in remission: Secondary | ICD-10-CM

## 2012-11-30 DIAGNOSIS — I10 Essential (primary) hypertension: Secondary | ICD-10-CM

## 2012-11-30 DIAGNOSIS — I2510 Atherosclerotic heart disease of native coronary artery without angina pectoris: Secondary | ICD-10-CM

## 2012-11-30 DIAGNOSIS — F4310 Post-traumatic stress disorder, unspecified: Secondary | ICD-10-CM

## 2012-11-30 DIAGNOSIS — E669 Obesity, unspecified: Secondary | ICD-10-CM

## 2012-11-30 DIAGNOSIS — E871 Hypo-osmolality and hyponatremia: Secondary | ICD-10-CM | POA: Insufficient documentation

## 2012-11-30 DIAGNOSIS — Z79899 Other long term (current) drug therapy: Secondary | ICD-10-CM | POA: Insufficient documentation

## 2012-11-30 DIAGNOSIS — E119 Type 2 diabetes mellitus without complications: Secondary | ICD-10-CM

## 2012-11-30 DIAGNOSIS — Z87891 Personal history of nicotine dependence: Secondary | ICD-10-CM | POA: Insufficient documentation

## 2012-11-30 DIAGNOSIS — E1142 Type 2 diabetes mellitus with diabetic polyneuropathy: Secondary | ICD-10-CM | POA: Insufficient documentation

## 2012-11-30 DIAGNOSIS — R079 Chest pain, unspecified: Principal | ICD-10-CM

## 2012-11-30 DIAGNOSIS — E785 Hyperlipidemia, unspecified: Secondary | ICD-10-CM | POA: Diagnosis present

## 2012-11-30 LAB — BASIC METABOLIC PANEL
CO2: 21 mEq/L (ref 19–32)
Calcium: 9.6 mg/dL (ref 8.4–10.5)
Chloride: 99 mEq/L (ref 96–112)
Creatinine, Ser: 0.73 mg/dL (ref 0.50–1.35)
Glucose, Bld: 88 mg/dL (ref 70–99)

## 2012-11-30 LAB — GLUCOSE, CAPILLARY: Glucose-Capillary: 134 mg/dL — ABNORMAL HIGH (ref 70–99)

## 2012-11-30 LAB — CBC
HCT: 42.3 % (ref 39.0–52.0)
Hemoglobin: 14.9 g/dL (ref 13.0–17.0)
MCH: 32.3 pg (ref 26.0–34.0)
MCV: 91.8 fL (ref 78.0–100.0)
Platelets: 168 10*3/uL (ref 150–400)
RBC: 4.61 MIL/uL (ref 4.22–5.81)
WBC: 8.1 10*3/uL (ref 4.0–10.5)

## 2012-11-30 LAB — POCT I-STAT TROPONIN I: Troponin i, poc: 0 ng/mL (ref 0.00–0.08)

## 2012-11-30 MED ORDER — LISINOPRIL 40 MG PO TABS
40.0000 mg | ORAL_TABLET | Freq: Every day | ORAL | Status: DC
  Administered 2012-12-01: 40 mg via ORAL
  Filled 2012-11-30 (×2): qty 1

## 2012-11-30 MED ORDER — CANAGLIFLOZIN 300 MG PO TABS: 1.0000 | ORAL_TABLET | Freq: Every day | ORAL | Status: DC

## 2012-11-30 MED ORDER — ENOXAPARIN SODIUM 40 MG/0.4ML ~~LOC~~ SOLN
40.0000 mg | SUBCUTANEOUS | Status: DC
  Administered 2012-11-30 – 2012-12-01 (×2): 40 mg via SUBCUTANEOUS
  Filled 2012-11-30 (×3): qty 0.4

## 2012-11-30 MED ORDER — NITROGLYCERIN 0.4 MG SL SUBL
0.4000 mg | SUBLINGUAL_TABLET | SUBLINGUAL | Status: DC | PRN
Start: 2012-11-30 — End: 2012-12-02

## 2012-11-30 MED ORDER — SODIUM CHLORIDE 0.9 % IV SOLN
INTRAVENOUS | Status: DC
  Administered 2012-11-30 – 2012-12-01 (×2): via INTRAVENOUS

## 2012-11-30 MED ORDER — ACETAMINOPHEN 325 MG PO TABS: 650.0000 mg | ORAL_TABLET | ORAL | Status: DC | PRN

## 2012-11-30 MED ORDER — ASPIRIN EC 325 MG PO TBEC
325.0000 mg | DELAYED_RELEASE_TABLET | Freq: Every day | ORAL | Status: DC
  Administered 2012-11-30 – 2012-12-01 (×2): 325 mg via ORAL
  Filled 2012-11-30 (×3): qty 1

## 2012-11-30 MED ORDER — AMLODIPINE BESYLATE 10 MG PO TABS
10.0000 mg | ORAL_TABLET | Freq: Every day | ORAL | Status: DC
  Administered 2012-12-01: 10 mg via ORAL
  Filled 2012-11-30 (×2): qty 1

## 2012-11-30 MED ORDER — INFLUENZA VAC SPLIT QUAD 0.5 ML IM SUSP
0.5000 mL | INTRAMUSCULAR | Status: AC
  Administered 2012-12-01: 0.5 mL via INTRAMUSCULAR
  Filled 2012-11-30 (×3): qty 0.5

## 2012-11-30 MED ORDER — INSULIN ASPART 100 UNIT/ML ~~LOC~~ SOLN
0.0000 [IU] | Freq: Three times a day (TID) | SUBCUTANEOUS | Status: DC
  Administered 2012-12-02: 2 [IU] via SUBCUTANEOUS

## 2012-11-30 MED ORDER — METOPROLOL TARTRATE 25 MG PO TABS
25.0000 mg | ORAL_TABLET | Freq: Two times a day (BID) | ORAL | Status: DC
  Administered 2012-11-30 – 2012-12-01 (×3): 25 mg via ORAL
  Filled 2012-11-30 (×5): qty 1

## 2012-11-30 MED ORDER — ONDANSETRON HCL 4 MG/2ML IJ SOLN: 4.0000 mg | Freq: Four times a day (QID) | INTRAMUSCULAR | Status: DC | PRN

## 2012-11-30 MED ORDER — GABAPENTIN 400 MG PO CAPS
400.0000 mg | ORAL_CAPSULE | Freq: Two times a day (BID) | ORAL | Status: DC
  Administered 2012-11-30 – 2012-12-01 (×3): 400 mg via ORAL
  Filled 2012-11-30 (×5): qty 1

## 2012-11-30 NOTE — ED Provider Notes (Signed)
CSN: [id number]     Arrival date & time 11/30/12  1626 History   First MD Initiated Contact with Patient 11/30/12 1637     Chief Complaint  Patient presents with  . Chest Pain   (Consider location/radiation/quality/duration/timing/severity/associated sxs/prior Treatment) HPI Comments: Patient is a 57 year old male with a past medical history of diabetes, colon cancer, PTSD and cardiac catheterization in 2001 without stent placement who presents to the emergency complaining of midsternal chest pain x1 day. Patient states for lunch he ate some curry, about an hour and a half later developed midsternal chest pain described as someone stepping on his chest. Symptoms last for only a short period of time until he took a friend's nitroglycerin causing him to burp which relieved his pain completely. Pain has not returned since. Admits to associated clammy feeling at symptoms onset. Denies shortness of breath, cough, fevers, chills, recent illness, abdominal pain. He has been having intermittent chest pains for years. States he is under increased stress, lost a good friend yesterday to a heart attack.  Patient is a 57 y.o. male presenting with chest pain. The history is provided by the patient.  Chest Pain Associated symptoms: diaphoresis     Past Medical History  Diagnosis Date  . Fistula, anal     multiple  . Abscess of anal and rectal regions     horse shoe abscess  . Poor circulation   . Fatigue   . Sleep deprivation   . Colon cancer 05/2005  . Type II diabetes mellitus   . DM type 2 with diabetic peripheral neuropathy     left foot  . Arthritis     "everywhere; especially in my spine"  . PTSD (post-traumatic stress disorder)     "severe"   Past Surgical History  Procedure Laterality Date  . Hemicolectomy  06/06/2005    left  . Incision and drainage perirectal abscess  03/30/2005  . Examination under anesthesia  03/22/2005    fistula  . Incision and drainage perirectal abscess   12/13/2004  . Incision and drainage perirectal abscess  07/14/2001  . Anal fistulectomy  12/13/1998  . Incision and drainage perirectal abscess  08/12/2010    horseshoe abscess; Dr Andrey Campanile  . Lumbar disc surgery  1987  . Thoracic discectomy  [id number]  . Anterior fusion cervical spine  2010    "triple"; "did 2 surgeries on the same day in 2010 due to complications"  . Abscess drainage      "probably 15-20 so far at least; rectal"  . Coronary angioplasty  2001  . Detached muscle  2010    right chest; "after cervical fusion complications"  . Elbow surgery  2007    "cut out part of a muscle"; left  . Percutaneous pinning phalanx fracture of hand  ~ 2008    "plates in 2 places"; right  . Examination under anesthesia  06/06/2011    Procedure: EXAM UNDER ANESTHESIA;  Surgeon: Almond Lint, MD;  Location: [facility] OR;  Service: General;  Laterality: N/A;  . Incision and drainage perirectal abscess  06/06/2011    Procedure: IRRIGATION AND DEBRIDEMENT PERIRECTAL ABSCESS;  Surgeon: Almond Lint, MD;  Location: [facility];  Service: General;  Laterality: N/A;   Family History  Problem Relation Age of Onset  . Heart disease Father   . Heart disease Mother   . Cancer Mother   . Heart disease Brother   . Cancer Sister    History  Substance Use Topics  . Smoking status:  Former Smoker -- 1.00 packs/day for 10 years    Types: Cigarettes    Quit date: 01/08/1991  . Smokeless tobacco: Never Used  . Alcohol Use: Yes     Comment: 06/05/11 "drank enough when I was younger to last me my whole life; don't remember when I had my last drink, maybe 2012"    Review of Systems  Constitutional: Positive for diaphoresis.  Cardiovascular: Positive for chest pain.  All other systems reviewed and are negative.    Allergies  Other  Home Medications   Current Outpatient Rx  Name  Route  Sig  Dispense  Refill  . amLODipine (NORVASC) 10 MG tablet   Oral   Take 10 mg by mouth daily.         Marland Kitchen aspirin EC 81 MG  tablet   Oral   Take 81 mg by mouth daily.         Marland Kitchen gabapentin (NEURONTIN) 400 MG capsule   Oral   Take 400 mg by mouth 2 (two) times daily.         Marland Kitchen glipiZIDE (GLUCOTROL) 5 MG tablet   Oral   Take 5 mg by mouth 2 (two) times daily before a meal.         . INVOKANA 300 MG TABS   Oral   Take 1 tablet by mouth daily.         Marland Kitchen lisinopril (PRINIVIL,ZESTRIL) 40 MG tablet   Oral   Take 40 mg by mouth daily.         . metFORMIN (GLUCOPHAGE) 1000 MG tablet   Oral   Take 1,000 mg by mouth 2 (two) times daily with a meal.          BP 123/75  Pulse 87  Temp(Src) 98.2 F (36.8 C)  Resp 16  SpO2 96% Physical Exam  Nursing note and vitals reviewed. Constitutional: He is oriented to person, place, and time. He appears well-developed and well-nourished. No distress.  Obese  HENT:  Head: Normocephalic and atraumatic.  Mouth/Throat: Oropharynx is clear and moist.  Eyes: Conjunctivae and EOM are normal. Pupils are equal, round, and reactive to light.  Neck: Normal range of motion. Neck supple.  Cardiovascular: Normal rate, regular rhythm and normal heart sounds.   Pulmonary/Chest: Effort normal and breath sounds normal.  Abdominal: Soft. Bowel sounds are normal. He exhibits no distension. There is no tenderness.  Musculoskeletal: Normal range of motion. He exhibits no edema.  Neurological: He is alert and oriented to person, place, and time. He has normal strength. No sensory deficit.  Skin: Skin is warm and dry. He is not diaphoretic.  Psychiatric: He has a normal mood and affect. His behavior is normal.    ED Course  Procedures (including critical care time) Labs Review Labs Reviewed  BASIC METABOLIC PANEL - Abnormal; Notable for the following:    Sodium 131 (*)    All other components within normal limits  CBC  POCT I-STAT TROPONIN I   Imaging Review Dg Chest 2 View  11/30/2012   CLINICAL DATA:  Chest pain  EXAM: CHEST  2 VIEW  COMPARISON:  06/06/2011   FINDINGS: Cardiomediastinal silhouette is stable. No acute infiltrate or pleural effusion. No pulmonary edema. Stable minimal degenerative changes thoracic spine.  IMPRESSION: No active cardiopulmonary disease.   Electronically Signed   By: Natasha Mead M.D.   On: 11/30/2012 17:26    EKG Interpretation    Date/Time:  Monday November 30 2012 16:35:56 EST  Ventricular Rate:  87 PR Interval:  151 QRS Duration: 117 QT Interval:  366 QTC Calculation: 440 R Axis:   91 Text Interpretation:  Sinus rhythm Nonspecific intraventricular conduction delay Baseline wander in lead(s) V3 Unchanged compared to ECG on 07/13/2001 Confirmed by WARD  DO, KRISTEN ([id number]) on 11/30/2012 5:05:19 PM            MDM   1. Chest pain    Patient with chest pain relieved by nitroglycerin. History of cardiac cath. Symptomatic free in the emergency department. EKG, chest x-ray, troponin and labs normal. Given the nature of the symptoms and the patient's medical history, will admit for observation. Admission accepted by Dr. Gonzella Lex, [facility]. Case discussed with attending Dr. Elesa Massed who agrees with plan of care.    Trevor Mace, PA-C 11/30/12 [phone]

## 2012-11-30 NOTE — ED Notes (Signed)
Patient reports that he had central chest pain that started yesterday and has been intermittent. Patient has had a heart cath in the past. No stents present. Patient reports that he ate curry today. Patient states he took a friend's NTG and "burped". Patient states he was pain free. Patient denies CP at this time.

## 2012-11-30 NOTE — H&P (Addendum)
[facility] History and Physical  NYLE LIMB FGH:[id number] DOB: 20-Mar-1955 DOA: 11/30/2012  Referring physician: ED PCP: [name], MD   Chief Complaint:  Chest pain ar rest   HPI:  57 year old obese male with history of hypertension, diabetes mellitus type 2 on oral hypoglycemics, hyperlipidemia, history of CAD with cardiac cath in 2001 requiring balloon dilatation of the coronary, PTSD who presented to the ED with acute onset of substernal chest pain this afternoon. He was reading at a local book store during the event. He describes the chest pain as someone sitting on the chest, 10/10 in intensity, nonradiating, without any aggravating or relieving factors. The pain lasted for almost 5 minutes and patient was clammy and diaphoretic. His friend gave him a tablet of nitroglycerin after which the pain subsided. He denied any shortness of breath, palpitations, headache, dizziness, fever, chills, nausea, vomiting, abdominal pain, dysuria, bowel symptoms. Denies any trauma to the chest or recent heavy exercise or lifting heavy weights. He denies any recent travel he did he denies any recent stress except that he was feeling low since his close friend died of MI yesterday. Patient reports having similar symptoms on 2 occasions for possible weeks which were less severe and lasting for about 1-to 2 minutes only and self subsided.  The patient reports being compliant with his medications including baby aspirin. He reports quitting smoking for over one year and has been a heavy smoker for several years. At baseline he is fairly active  Course in the ED Patient's chest pain symptoms have subsided by the time he came to the ED. Vitals were stable he did EKG was unremarkable. Initial troponin in the ED was negative. Blood work was normal . [facility] consulted For admitting patient to telemetry rule out ACS  Review of Systems:  Constitutional:diaphoresis,  Denies fever,  chills, appetite change and fatigue.  HEENT: Denies photophobia, ear pain, congestion, sore throat, rhinorrhea, sneezing, mouth sores, trouble swallowing, neck pain, neck stiffness and tinnitus.   Respiratory: Denies SOB, DOE, cough,   and wheezing.   Cardiovascular: chest pain, Denies palpitations and leg swelling.  Gastrointestinal: Denies nausea, vomiting, abdominal pain, diarrhea, constipation, blood in stool and abdominal distention.  Genitourinary: Denies dysuria, urgency, frequency, hematuria, flank pain and difficulty urinating.  Endocrine: Denies polyuria, polydipsia. Musculoskeletal: Denies myalgias, back pain, joint swelling, arthralgias and gait problem.  Neurological: Denies dizziness, seizures, syncope, weakness, light-headedness, numbness and headaches.  Psychiatric/Behavioral: Denies suicidal ideation, mood changes, confusion, nervousness, sleep disturbance and agitation   Past Medical History  Diagnosis Date  . Fistula, anal     multiple  . Abscess of anal and rectal regions     horse shoe abscess  . Poor circulation   . Fatigue   . Sleep deprivation   . Colon cancer 05/2005  . Type II diabetes mellitus   . DM type 2 with diabetic peripheral neuropathy     left foot  . Arthritis     "everywhere; especially in my spine"  . PTSD (post-traumatic stress disorder)     "severe"   Past Surgical History  Procedure Laterality Date  . Hemicolectomy  06/06/2005    left  . Incision and drainage perirectal abscess  03/30/2005  . Examination under anesthesia  03/22/2005    fistula  . Incision and drainage perirectal abscess  12/13/2004  . Incision and drainage perirectal abscess  07/14/2001  . Anal fistulectomy  12/13/1998  . Incision and drainage perirectal abscess  08/12/2010  horseshoe abscess; Dr Redmond Pulling  . Lumbar disc surgery  1987  . Thoracic discectomy  [id number]  . Anterior fusion cervical spine  2010    "triple"; "did 2 surgeries on the same day in 8502 due to  complications"  . Abscess drainage      "probably 15-20 so far at least; rectal"  . Coronary angioplasty  2001  . Detached muscle  2010    right chest; "after cervical fusion complications"  . Elbow surgery  2007    "cut out part of a muscle"; left  . Percutaneous pinning phalanx fracture of hand  ~ 2008    "plates in 2 places"; right  . Examination under anesthesia  06/06/2011    Procedure: EXAM UNDER ANESTHESIA;  Surgeon: Stark Klein, MD;  Location: [facility];  Service: General;  Laterality: N/A;  . Incision and drainage perirectal abscess  06/06/2011    Procedure: IRRIGATION AND DEBRIDEMENT PERIRECTAL ABSCESS;  Surgeon: Stark Klein, MD;  Location: [facility];  Service: General;  Laterality: N/A;   Social History:  reports that he quit smoking about 21 years ago. His smoking use included Cigarettes. He has a 10 pack-year smoking history. He has never used smokeless tobacco. He reports that he drinks alcohol. He reports that he uses illicit drugs (Oxycodone and Marijuana).  Allergies  Allergen Reactions  . Other Other (See Comments)    Patient is recovering from drug addiction does not want any narcotics    Family History  Problem Relation Age of Onset  . Heart disease Father   . Heart disease Mother   . Cancer Mother   . Heart disease Brother   . Cancer Sister     Prior to Admission medications   Medication Sig Start Date End Date Taking? Authorizing Provider  amLODipine (NORVASC) 10 MG tablet Take 10 mg by mouth daily.   Yes Historical Provider, MD  aspirin EC 81 MG tablet Take 81 mg by mouth daily.   Yes Historical Provider, MD  gabapentin (NEURONTIN) 400 MG capsule Take 400 mg by mouth 2 (two) times daily.   Yes Historical Provider, MD  glipiZIDE (GLUCOTROL) 5 MG tablet Take 5 mg by mouth 2 (two) times daily before a meal.   Yes Historical Provider, MD  INVOKANA 300 MG TABS Take 1 tablet by mouth daily. 11/22/12  Yes Historical Provider, MD  lisinopril (PRINIVIL,ZESTRIL) 40 MG  tablet Take 40 mg by mouth daily.   Yes Historical Provider, MD  metFORMIN (GLUCOPHAGE) 1000 MG tablet Take 1,000 mg by mouth 2 (two) times daily with a meal.   Yes Historical Provider, MD    Physical Exam:  Filed Vitals:   11/30/12 1627 11/30/12 1837  BP: 123/75 109/60  Pulse: 87   Temp: 98.2 F (36.8 C)   TempSrc:  Oral  Resp: 16 14  SpO2: 96% 98%    Constitutional: Vital signs reviewed. Middle aged obese male in no acute distress HEENT: No pallor, no icterus, moist oral mucosa, no JVD Cardiovascular: RRR, S1 normal, S2 normal, no MRG, pulses symmetric and intact bilaterally Pulmonary/Chest: CTAB, no wheezes, rales, or rhonchi Abdominal: Soft. Non-tender, non-distended, bowel sounds are normal, no masses, organomegaly, or guarding present.   extremities: Warm, trace edema over right lower extremity Neurological: A&O x3, nonfocal  Labs on Admission:  Basic Metabolic Panel:  Recent Labs Lab 11/30/12 1720  NA 131*  K 4.1  CL 99  CO2 21  GLUCOSE 88  BUN 13  CREATININE 0.73  CALCIUM 9.6  Liver Function Tests: No results found for this basename: AST, ALT, ALKPHOS, BILITOT, PROT, ALBUMIN,  in the last 168 hours No results found for this basename: LIPASE, AMYLASE,  in the last 168 hours No results found for this basename: AMMONIA,  in the last 168 hours CBC:  Recent Labs Lab 11/30/12 1720  WBC 8.1  HGB 14.9  HCT 42.3  MCV 91.8  PLT 168   Cardiac Enzymes: No results found for this basename: CKTOTAL, CKMB, CKMBINDEX, TROPONINI,  in the last 168 hours BNP: No components found with this basename: POCBNP,  CBG: No results found for this basename: GLUCAP,  in the last 168 hours  Radiological Exams on Admission: Dg Chest 2 View  11/30/2012   CLINICAL DATA:  Chest pain  EXAM: CHEST  2 VIEW  COMPARISON:  06/06/2011  FINDINGS: Cardiomediastinal silhouette is stable. No acute infiltrate or pleural effusion. No pulmonary edema. Stable minimal degenerative changes  thoracic spine.  IMPRESSION: No active cardiopulmonary disease.   Electronically Signed   By: Lahoma Crocker M.D.   On: 11/30/2012 17:26    EKG: Sinus rhythm at 87, no ST-T changes  Assessment/Plan  Principal Problem:   Chest pain at rest -Admit on observation to telemetry.  -Risk of major cardiac event is intermediate ( 4 ). -Will place on aspirin 325 mg daily. -added low dose metoprolol.Sublingual nitrate when necessary for chest pain. -We'll rule out ACS with serial troponins and EKG. Will check 2-D echo to r/o any wall motion abnormality. -discussed with cardiology consult Dr Gwenlyn Found who will see  patient tomorrow morning. agrees with keeping patient n.p.o. for stress test tomorrow.   Active Problems:   Hypertension stable. Resume home medications    Diabetes mellitus Code metformin and glipizide. Continue invokana. Place on SSI. Check A1C      Hyperlipemia  continue statin. check lipid panel in a.m.    CAD (coronary artery disease) hx of cardiac cath in 2011 by Dr Glade Lloyd. Continue ASA and statin.  Hyponatremia  will hydrate gently. Monitor in am  DVT prophylaxis: Subcutaneous Lovenox Diet: Diabetic  Code Status: Full code Family Communication: Wife and son at bedside Disposition Plan: Home once workup completed  Louellen Molder [facility] Pager [phone]  If 7PM-7AM, please contact night-coverage [email or web address] Password [email or web address] 11/30/2012, 7:10 PM   Total time spent on admission: 55 minutes

## 2012-11-30 NOTE — ED Provider Notes (Signed)
  Medical screening examination/treatment/procedure(s) were performed by non-physician practitioner and as supervising physician I was immediately available for consultation/collaboration.  EKG Interpretation    Date/Time:  Monday November 30 2012 16:35:56 EST Ventricular Rate:  87 PR Interval:  151 QRS Duration: 117 QT Interval:  366 QTC Calculation: 440 R Axis:   91 Text Interpretation:  Sinus rhythm Nonspecific intraventricular conduction delay Baseline wander in lead(s) V3 Unchanged compared to ECG on 07/13/2001 Confirmed by WARD  DO, KRISTEN ([id number]) on 11/30/2012 5:05:19 PM              [place], DO 11/30/12 2346

## 2012-12-01 DIAGNOSIS — E119 Type 2 diabetes mellitus without complications: Secondary | ICD-10-CM

## 2012-12-01 DIAGNOSIS — I517 Cardiomegaly: Secondary | ICD-10-CM

## 2012-12-01 DIAGNOSIS — E785 Hyperlipidemia, unspecified: Secondary | ICD-10-CM

## 2012-12-01 DIAGNOSIS — E669 Obesity, unspecified: Secondary | ICD-10-CM

## 2012-12-01 DIAGNOSIS — I10 Essential (primary) hypertension: Secondary | ICD-10-CM

## 2012-12-01 DIAGNOSIS — R079 Chest pain, unspecified: Secondary | ICD-10-CM

## 2012-12-01 DIAGNOSIS — I2510 Atherosclerotic heart disease of native coronary artery without angina pectoris: Secondary | ICD-10-CM

## 2012-12-01 LAB — BASIC METABOLIC PANEL
CO2: 27 mEq/L (ref 19–32)
Chloride: 98 mEq/L (ref 96–112)
Glucose, Bld: 108 mg/dL — ABNORMAL HIGH (ref 70–99)
Potassium: 4.8 mEq/L (ref 3.5–5.1)
Sodium: 133 mEq/L — ABNORMAL LOW (ref 135–145)

## 2012-12-01 LAB — LIPID PANEL
Cholesterol: 213 mg/dL — ABNORMAL HIGH (ref 0–200)
Total CHOL/HDL Ratio: 7.1 RATIO
Triglycerides: 323 mg/dL — ABNORMAL HIGH (ref <150)
VLDL: 65 mg/dL — ABNORMAL HIGH (ref 0–40)

## 2012-12-01 LAB — TROPONIN I
Troponin I: <0.3 ng/mL (ref <0.30)
Troponin I: <0.3 ng/mL (ref <0.30)

## 2012-12-01 LAB — GLUCOSE, CAPILLARY
Glucose-Capillary: 106 mg/dL — ABNORMAL HIGH (ref 70–99)
Glucose-Capillary: 102 mg/dL — ABNORMAL HIGH (ref 70–99)
Glucose-Capillary: 146 mg/dL — ABNORMAL HIGH (ref 70–99)

## 2012-12-01 LAB — HEMOGLOBIN A1C: Mean Plasma Glucose: 128 mg/dL — ABNORMAL HIGH (ref <117)

## 2012-12-01 MED ORDER — REGADENOSON 0.4 MG/5ML IV SOLN
0.4000 mg | Freq: Once | INTRAVENOUS | Status: DC
  Filled 2012-12-01: qty 5

## 2012-12-01 MED ORDER — ATORVASTATIN CALCIUM 40 MG PO TABS
40.0000 mg | ORAL_TABLET | Freq: Every day | ORAL | Status: DC
  Administered 2012-12-01: 40 mg via ORAL
  Filled 2012-12-01 (×3): qty 1

## 2012-12-01 NOTE — Progress Notes (Signed)
UR completed 

## 2012-12-01 NOTE — Consult Note (Addendum)
Pt. Seen and examined. Agree with the NP/PA-C note as written. Chest pain on and off for the past 2 weeks, he has been under stress with his best friend who died - funeral is tomorrow. CAD history with remote angioplasty by Tysinger in 2001.  Multiple cardiac risk factors. Symptoms most concerning for unstable angina. No further chest pain after symptoms relieved with nitroglycerin. LDL 114 - not on statin. Start lipitor 40 mg daily.  Echo reviewed, no WMA's, EF 55-60%, impaired relaxation.  Recommend NST tomorrow (cannot arrange today) at [facility]. If abnormal or chest pain recurs, would recommend cardiac catheterization.   Pixie Casino, MD, [facility] Attending Cardiologist [place]

## 2012-12-01 NOTE — Progress Notes (Signed)
[facility] PROGRESS NOTE  Matthew Lewis AYO:[id number] DOB: 07-22-1955 DOA: 11/30/2012  PCP: [name], MD  Brief HPI: 57 year old obese male with history of hypertension, diabetes mellitus type 2 on oral hypoglycemics, hyperlipidemia, history of CAD with cardiac cath in 2001 requiring balloon dilatation of the coronary, PTSD who presented to the ED with acute onset of substernal chest pain. He was reading at a local book store during the event. He denies any recent travel he did he denies any recent stress except that he was feeling low since his close friend died of MI recently.   Past medical history:  Past Medical History  Diagnosis Date  . Fistula, anal     multiple  . Abscess of anal and rectal regions     horse shoe abscess  . Poor circulation   . Fatigue   . Sleep deprivation   . Colon cancer 05/2005  . Type II diabetes mellitus   . DM type 2 with diabetic peripheral neuropathy     left foot  . Arthritis     "everywhere; especially in my spine"  . PTSD (post-traumatic stress disorder)     "severe"    Consultants: Cardiology  Procedures: None yet  Antibiotics: None  Subjective: Patient denies any chest pain currently. Says pain resolved after 5 mins. No new complaints.  Objective: Vital Signs  Filed Vitals:   11/30/12 2055 11/30/12 2232 12/01/12 0200 12/01/12 0500  BP: 118/70 114/62 118/68 121/70  Pulse: 77  70 80  Temp: 97.6 F (36.4 C)  98 F (36.7 C) 98.5 F (36.9 C)  TempSrc: Oral  Oral Oral  Resp: 16  17 17   Height:      Weight:      SpO2: 98% 99% 99% 99%   Filed Weights   11/30/12 2050  Weight: 115.5 kg (254 lb 10.1 oz)    Intake/Output from previous day: 11/24 0701 - 11/25 0700 In: 413.3 [I.V.:413.3] Out: -   General appearance: alert, cooperative, appears stated age and no distress Resp: clear to auscultation bilaterally Cardio: regular rate and rhythm, S1, S2 normal, no murmur, click, rub or gallop GI: soft,  non-tender; bowel sounds normal; no masses,  no organomegaly Extremities: extremities normal, atraumatic, no cyanosis or edema Neurologic: No focal deficits  Lab Results:  Basic Metabolic Panel:  Recent Labs Lab 11/30/12 1720 12/01/12 0305  NA 131* 133*  K 4.1 4.8  CL 99 98  CO2 21 27  GLUCOSE 88 108*  BUN 13 13  CREATININE 0.73 0.95  CALCIUM 9.6 9.2   CBC:  Recent Labs Lab 11/30/12 1720  WBC 8.1  HGB 14.9  HCT 42.3  MCV 91.8  PLT 168   Cardiac Enzymes:  Recent Labs Lab 11/30/12 2057 12/01/12 0305 12/01/12 0857  TROPONINI <0.30 <0.30 <0.30   BNP (last 3 results) No results found for this basename: PROBNP,  in the last 8760 hours CBG:  Recent Labs Lab 11/30/12 2237 12/01/12 0740  GLUCAP 134* 106*    No results found for this or any previous visit (from the past 240 hour(s)).    Studies/Results: Dg Chest 2 View  11/30/2012   CLINICAL DATA:  Chest pain  EXAM: CHEST  2 VIEW  COMPARISON:  06/06/2011  FINDINGS: Cardiomediastinal silhouette is stable. No acute infiltrate or pleural effusion. No pulmonary edema. Stable minimal degenerative changes thoracic spine.  IMPRESSION: No active cardiopulmonary disease.   Electronically Signed   By: Lahoma Crocker M.D.   On: 11/30/2012  17:26    Medications:  Scheduled: . amLODipine  10 mg Oral Daily  . aspirin EC  325 mg Oral Daily  . atorvastatin  40 mg Oral q1800  . Canagliflozin  1 tablet Oral Daily  . enoxaparin (LOVENOX) injection  40 mg Subcutaneous Q24H  . gabapentin  400 mg Oral BID  . insulin aspart  0-15 Units Subcutaneous TID WC  . lisinopril  40 mg Oral Daily  . metoprolol tartrate  25 mg Oral BID  . [START ON 12/02/2012] regadenoson  0.4 mg Intravenous Once   Continuous: . sodium chloride 50 mL/hr at 11/30/12 2244   ASU:[id number], nitroGLYCERIN, ondansetron (ZOFRAN) IV  Assessment/Plan:  Principal Problem:   Chest pain at rest Active Problems:   Obesity   Hypertension   Hyperlipemia    Diabetes mellitus   CAD (coronary artery disease)    Chest pain at rest  Seen by cardiology and plan is for stress testing due to multiple risk factors. He is ruling out for ACS. Continue current management for now. ECHO is pending.   History of Hypertension  Stable. Continue home medications   Diabetes mellitus  SSI. Holding metformin and glipizide. Continue invokana. A1C is pending.  Hyperlipemia  Continue statin.   History of CAD (coronary artery disease)  Hx of cardiac cath in 2001 by Dr Glade Lloyd with balloon angioplasty. Continue ASA and statin.   Hyponatremia  Improved. Monitor.   Code Status: Full code  DVT Prophylaxis: Lovenox    Family Communication: Discussed with patient  Disposition Plan: Per cardiology. For stress test in AM.    LOS: 1 day   [facility] Pager [phone] 12/01/2012, 1:33 PM  If 8PM-8AM, please contact night-coverage at [email or web address], password [facility]

## 2012-12-01 NOTE — Progress Notes (Signed)
  Echocardiogram 2D Echocardiogram has been performed.  Matthew Lewis 12/01/2012, 10:14 AM

## 2012-12-01 NOTE — Consult Note (Signed)
Reason for Consult: Chest Pain Referring Physician: TRH   HPI: The patient is a 57 y/o male, with multiple cardiac risk factors, including CAD, HTN, HLD, DM, remote tobacco abuse and obesity, who was admitted to [facility] last PM for evaluation of chest pain. He reports that he had a heart catheterization by Dr. Glade Lloyd in 2001, that resulted in single vessel balloon angioplasty. The patient does not recall the vessel intervened on and no records are found in EMR. He denies any chest pain since that time. He states that he is fairly active. He engages in physical activity and yard work without any difficulty. He lives in a 3 story house and never gets any exertional anginal or DOE walking up the stairs.   His chest pain started yesterday at rest, while at a bookstore. This occurred roughly 1 1/2 hours after consuming a large burrito. The pain was substernal and did not radiate. He states it felt like someone kicked him in the chest. No SOB, n/v or near syncope. He was diaphoretic. His friend gave him 1 SL NTG and the pain subsided after 5 minutes. He came to the ER for evaluation. He has had no recurrence in symptoms since that time. Cardiac enzymes are negative x 2. CXR, EKG and physical exam all unremarkable. His lipid panel is abnormal with a high LDH of 118 and low HDL of 30. Hgb A1C pending.   The patient also states that he has been under a lot of grief for the past several days, as he recently lost a close friend from an MI, whose funeral is tomorrow.    Past Medical History  Diagnosis Date  . Fistula, anal     multiple  . Abscess of anal and rectal regions     horse shoe abscess  . Poor circulation   . Fatigue   . Sleep deprivation   . Colon cancer 05/2005  . Type II diabetes mellitus   . DM type 2 with diabetic peripheral neuropathy     left foot  . Arthritis     "everywhere; especially in my spine"  . PTSD (post-traumatic stress disorder)     "severe"    Past Surgical History    Procedure Laterality Date  . Hemicolectomy  06/06/2005    left  . Incision and drainage perirectal abscess  03/30/2005  . Examination under anesthesia  03/22/2005    fistula  . Incision and drainage perirectal abscess  12/13/2004  . Incision and drainage perirectal abscess  07/14/2001  . Anal fistulectomy  12/13/1998  . Incision and drainage perirectal abscess  08/12/2010    horseshoe abscess; Dr Redmond Pulling  . Lumbar disc surgery  1987  . Thoracic discectomy  [id number]  . Anterior fusion cervical spine  2010    "triple"; "did 2 surgeries on the same day in [id number] due to complications"  . Abscess drainage      "probably 15-20 so far at least; rectal"  . Coronary angioplasty  2001  . Detached muscle  2010    right chest; "after cervical fusion complications"  . Elbow surgery  2007    "cut out part of a muscle"; left  . Percutaneous pinning phalanx fracture of hand  ~ 2008    "plates in 2 places"; right  . Examination under anesthesia  06/06/2011    Procedure: EXAM UNDER ANESTHESIA;  Surgeon: Stark Klein, MD;  Location: [facility];  Service: General;  Laterality: N/A;  . Incision and drainage perirectal abscess  06/06/2011  Procedure: IRRIGATION AND DEBRIDEMENT PERIRECTAL ABSCESS;  Surgeon: Stark Klein, MD;  Location: [facility] OR;  Service: General;  Laterality: N/A;    Family History  Problem Relation Age of Onset  . Heart disease Father   . Heart disease Mother   . Cancer Mother   . Heart disease Brother   . Cancer Sister     Social History:  reports that he quit smoking about 21 years ago. His smoking use included Cigarettes. He has a 10 pack-year smoking history. He has never used smokeless tobacco. He reports that he drinks alcohol. He reports that he uses illicit drugs (Oxycodone and Marijuana).  Allergies:  Allergies  Allergen Reactions  . Other Other (See Comments)    Patient is recovering from drug addiction does not want any narcotics    Prior to Admission medications   Medication Sig  Start Date End Date Taking? Authorizing Provider  amLODipine (NORVASC) 10 MG tablet Take 10 mg by mouth daily.   Yes Historical Provider, MD  aspirin EC 81 MG tablet Take 81 mg by mouth daily.   Yes Historical Provider, MD  gabapentin (NEURONTIN) 400 MG capsule Take 400 mg by mouth 2 (two) times daily.   Yes Historical Provider, MD  glipiZIDE (GLUCOTROL) 5 MG tablet Take 5 mg by mouth 2 (two) times daily before a meal.   Yes Historical Provider, MD  INVOKANA 300 MG TABS Take 1 tablet by mouth daily. 11/22/12  Yes Historical Provider, MD  lisinopril (PRINIVIL,ZESTRIL) 40 MG tablet Take 40 mg by mouth daily.   Yes Historical Provider, MD  metFORMIN (GLUCOPHAGE) 1000 MG tablet Take 1,000 mg by mouth 2 (two) times daily with a meal.   Yes Historical Provider, MD       Results for orders placed during the hospital encounter of 11/30/12 (from the past 48 hour(s))  CBC     Status: None   Collection Time    11/30/12  5:20 PM      Result Value Range   WBC 8.1  4.0 - 10.5 K/uL   RBC 4.61  4.22 - 5.81 MIL/uL   Hemoglobin 14.9  13.0 - 17.0 g/dL   HCT 42.3  39.0 - 52.0 %   MCV 91.8  78.0 - 100.0 fL   MCH 32.3  26.0 - 34.0 pg   MCHC 35.2  30.0 - 36.0 g/dL   RDW 12.5  11.5 - 15.5 %   Platelets 168  150 - 400 K/uL  BASIC METABOLIC PANEL     Status: Abnormal   Collection Time    11/30/12  5:20 PM      Result Value Range   Sodium 131 (*) 135 - 145 mEq/L   Potassium 4.1  3.5 - 5.1 mEq/L   Chloride 99  96 - 112 mEq/L   CO2 21  19 - 32 mEq/L   Glucose, Bld 88  70 - 99 mg/dL   BUN 13  6 - 23 mg/dL   Creatinine, Ser 0.73  0.50 - 1.35 mg/dL   Calcium 9.6  8.4 - 10.5 mg/dL   GFR calc non Af Amer >90  >90 mL/min   GFR calc Af Amer >90  >90 mL/min   Comment: (NOTE)     The eGFR has been calculated using the CKD EPI equation.     This calculation has not been validated in all clinical situations.     eGFR's persistently <90 mL/min signify possible Chronic Kidney     Disease.  POCT I-STAT  TROPONIN I      Status: None   Collection Time    11/30/12  5:27 PM      Result Value Range   Troponin i, poc 0.00  0.00 - 0.08 ng/mL   Comment 3            Comment: Due to the release kinetics of cTnI,     a negative result within the first hours     of the onset of symptoms does not rule out     myocardial infarction with certainty.     If myocardial infarction is still suspected,     repeat the test at appropriate intervals.  TROPONIN I     Status: None   Collection Time    11/30/12  8:57 PM      Result Value Range   Troponin I <0.30  <0.30 ng/mL   Comment:            Due to the release kinetics of cTnI,     a negative result within the first hours     of the onset of symptoms does not rule out     myocardial infarction with certainty.     If myocardial infarction is still suspected,     repeat the test at appropriate intervals.  GLUCOSE, CAPILLARY     Status: Abnormal   Collection Time    11/30/12 10:37 PM      Result Value Range   Glucose-Capillary 134 (*) 70 - 99 mg/dL   Comment 1 Documented in Chart     Comment 2 Notify RN    TROPONIN I     Status: None   Collection Time    12/01/12  3:05 AM      Result Value Range   Troponin I <0.30  <0.30 ng/mL   Comment:            Due to the release kinetics of cTnI,     a negative result within the first hours     of the onset of symptoms does not rule out     myocardial infarction with certainty.     If myocardial infarction is still suspected,     repeat the test at appropriate intervals.  LIPID PANEL     Status: Abnormal   Collection Time    12/01/12  3:05 AM      Result Value Range   Cholesterol 213 (*) 0 - 200 mg/dL   Triglycerides 323 (*) <150 mg/dL   HDL 30 (*) >39 mg/dL   Total CHOL/HDL Ratio 7.1     VLDL 65 (*) 0 - 40 mg/dL   LDL Cholesterol 118 (*) 0 - 99 mg/dL   Comment:            Total Cholesterol/HDL:CHD Risk     Coronary Heart Disease Risk Table                         Men   Women      1/2 Average Risk   3.4    3.3      Average Risk       5.0   4.4      2 X Average Risk   9.6   7.1      3 X Average Risk  23.4   11.0                Use the calculated Patient Ratio  above and the CHD Risk Table     to determine the patient's CHD Risk.                ATP III CLASSIFICATION (LDL):      <100     mg/dL   Optimal      100-129  mg/dL   Near or Above                        Optimal      130-159  mg/dL   Borderline      160-189  mg/dL   High      >190     mg/dL   Very High     Performed at [facility] PANEL     Status: Abnormal   Collection Time    12/01/12  3:05 AM      Result Value Range   Sodium 133 (*) 135 - 145 mEq/L   Potassium 4.8  3.5 - 5.1 mEq/L   Chloride 98  96 - 112 mEq/L   CO2 27  19 - 32 mEq/L   Glucose, Bld 108 (*) 70 - 99 mg/dL   BUN 13  6 - 23 mg/dL   Creatinine, Ser 0.95  0.50 - 1.35 mg/dL   Calcium 9.2  8.4 - 10.5 mg/dL   GFR calc non Af Amer 89 (*) >90 mL/min   GFR calc Af Amer >90  >90 mL/min   Comment: (NOTE)     The eGFR has been calculated using the CKD EPI equation.     This calculation has not been validated in all clinical situations.     eGFR's persistently <90 mL/min signify possible Chronic Kidney     Disease.  GLUCOSE, CAPILLARY     Status: Abnormal   Collection Time    12/01/12  7:40 AM      Result Value Range   Glucose-Capillary 106 (*) 70 - 99 mg/dL    Dg Chest 2 View  11/30/2012   CLINICAL DATA:  Chest pain  EXAM: CHEST  2 VIEW  COMPARISON:  06/06/2011  FINDINGS: Cardiomediastinal silhouette is stable. No acute infiltrate or pleural effusion. No pulmonary edema. Stable minimal degenerative changes thoracic spine.  IMPRESSION: No active cardiopulmonary disease.   Electronically Signed   By: Lahoma Crocker M.D.   On: 11/30/2012 17:26    Review of Systems  Constitutional: Positive for diaphoresis.  Respiratory: Negative for shortness of breath.   Cardiovascular: Positive for chest pain. Negative for orthopnea, leg swelling and  PND.  Gastrointestinal: Positive for heartburn. Negative for nausea and vomiting.  All other systems reviewed and are negative.   Blood pressure 121/70, pulse 80, temperature 98.5 F (36.9 C), temperature source Oral, resp. rate 17, height 5' 11"  (1.803 m), weight 254 lb 10.1 oz (115.5 kg), SpO2 99.00%. Physical Exam  Constitutional: He is oriented to person, place, and time. He appears well-developed and well-nourished. No distress.  Neck: No JVD present. Carotid bruit is not present.  Cardiovascular: Normal rate, regular rhythm, normal heart sounds and intact distal pulses.  Exam reveals no gallop and no friction rub.   No murmur heard. Pulses:      Radial pulses are 2+ on the right side, and 2+ on the left side.       Dorsalis pedis pulses are 2+ on the right side, and 2+ on the left side.  Respiratory: Effort normal. No respiratory distress. He has  no wheezes. He has no rales.  GI: Soft. Bowel sounds are normal. He exhibits no distension and no mass. There is no tenderness.  Musculoskeletal: He exhibits no edema.  Neurological: He is alert and oriented to person, place, and time.  Skin: Skin is warm and dry. He is not diaphoretic.  Psychiatric: He has a normal mood and affect. His behavior is normal.    Assessment/Plan: Principal Problem:   Chest pain at rest Active Problems:   Obesity   Hypertension   Hyperlipemia   Diabetes mellitus   CAD (coronary artery disease)  Plan:  57 y/o male, with multiple cardiac risk factors, including CAD, with single vessel balloon angioplasty in 2001, HTN, HLD, DM, remote tobacco abuse and obesity, admitted for chest pain. He denies any recent history of exertional angina with physical activity, however he had one episode of resting chest pain yesterday relived after 1 SL NTG. He has had no recurrence of pain since that time. Cardiac enzymes are negative. EKG, CXR and PE unremarkable. ? If CP was acid reflux, after eating a large meal. However,  due to his positive risk factors, he will need an ischemic eval. I recommend a NST. Since he has had no recurrence of symptoms and work-up is benign, it may be reasonable to complete as an outpatient. Will discuss with attending MDs. Would also recommend placing him on daily low dose ASA and a statin for DLD. Continue BB, ACE-I and CCB. MD to follow.    Consuelo Pandy, PA-C 12/01/2012, 8:42 AM

## 2012-12-02 ENCOUNTER — Other Ambulatory Visit: Payer: Self-pay

## 2012-12-02 ENCOUNTER — Ambulatory Visit (HOSPITAL_COMMUNITY)
Admit: 2012-12-02 | Discharge: 2012-12-02 | Disposition: A | Discharge status: Home / Self Care | Payer: BC Managed Care – PPO | Attending: Internal Medicine | Admitting: Internal Medicine

## 2012-12-02 ENCOUNTER — Ambulatory Visit (HOSPITAL_COMMUNITY): Admit: 2012-12-02 | Payer: BC Managed Care – PPO

## 2012-12-02 DIAGNOSIS — F1911 Other psychoactive substance abuse, in remission: Secondary | ICD-10-CM | POA: Diagnosis present

## 2012-12-02 DIAGNOSIS — F4310 Post-traumatic stress disorder, unspecified: Secondary | ICD-10-CM | POA: Diagnosis present

## 2012-12-02 DIAGNOSIS — R079 Chest pain, unspecified: Secondary | ICD-10-CM

## 2012-12-02 LAB — GLUCOSE, CAPILLARY
Glucose-Capillary: 131 mg/dL — ABNORMAL HIGH (ref 70–99)
Glucose-Capillary: 163 mg/dL — ABNORMAL HIGH (ref 70–99)

## 2012-12-02 MED ORDER — REGADENOSON 0.4 MG/5ML IV SOLN
INTRAVENOUS | Status: AC
  Filled 2012-12-02: qty 5

## 2012-12-02 MED ORDER — REGADENOSON 0.4 MG/5ML IV SOLN
0.4000 mg | Freq: Once | INTRAVENOUS | Status: AC
  Administered 2012-12-02: 0.4 mg via INTRAVENOUS

## 2012-12-02 MED ORDER — TECHNETIUM TC 99M SESTAMIBI GENERIC - CARDIOLITE
30.0000 | Freq: Once | INTRAVENOUS | Status: AC | PRN
  Administered 2012-12-02: 30 via INTRAVENOUS

## 2012-12-02 MED ORDER — CANAGLIFLOZIN 300 MG PO TABS
300.0000 mg | ORAL_TABLET | Freq: Every day | ORAL | Status: DC
  Filled 2012-12-02: qty 1

## 2012-12-02 MED ORDER — TECHNETIUM TC 99M SESTAMIBI GENERIC - CARDIOLITE
10.0000 | Freq: Once | INTRAVENOUS | Status: AC | PRN
  Administered 2012-12-02: 10 via INTRAVENOUS

## 2012-12-02 MED ORDER — ATORVASTATIN CALCIUM 40 MG PO TABS: 40.0000 mg | ORAL_TABLET | Freq: Every day | ORAL | Status: DC

## 2012-12-02 NOTE — Progress Notes (Signed)
Subjective:  No chest pain that sounds anginal last 12 hrs.  Objective:  Vital Signs in the last 24 hours: Temp:  [97.5 F (36.4 C)-97.8 F (36.6 C)] 97.5 F (36.4 C) (11/26 0500) Pulse Rate:  [72-87] 85 (11/26 1112) Resp:  [16] 16 (11/26 0500) BP: (102-121)/(53-80) 102/53 mmHg (11/26 1112) SpO2:  [95 %-98 %] 95 % (11/26 0500)  Intake/Output from previous day:  Intake/Output Summary (Last 24 hours) at 12/02/12 1121 Last data filed at 12/02/12 0700  Gross per 24 hour  Intake 1962.5 ml  Output      0 ml  Net 1962.5 ml    Physical Exam: General appearance: alert, cooperative, no distress and moderately obese Lungs: clear to auscultation bilaterally Heart: regular rate and rhythm   Rate: 82  Rhythm: normal sinus rhythm  Lab Results:  Recent Labs  11/30/12 1720  WBC 8.1  HGB 14.9  PLT 168    Recent Labs  11/30/12 1720 12/01/12 0305  NA 131* 133*  K 4.1 4.8  CL 99 98  CO2 21 27  GLUCOSE 88 108*  BUN 13 13  CREATININE 0.73 0.95    Recent Labs  12/01/12 0305 12/01/12 0857  TROPONINI <0.30 <0.30   No results found for this basename: INR,  in the last 72 hours  Imaging: Imaging results have been reviewed  Cardiac Studies:  Assessment/Plan:   Principal Problem:   Chest pain with moderate risk of acute coronary syndrome Active Problems:   CAD (coronary artery disease)- PCI 2001- no evaluation since   Hypertension   Hyperlipemia   Diabetes mellitus   Obesity   PTSD (post-traumatic stress disorder)   Hx of substance abuse- clean 15 months, in [facility]   PLAN: Myoview this am. Dr Debara Pickett will be his cardiologist after discharge.  Kerin Ransom PA-C Beeper [phone] 12/02/2012, 11:21 AM  No further CP & has been ambulating without difficulty. Just completed Myoview -- will await results.   On BB, ACE-I & CCB along with ASA & atorvastatin. - great regimen with stable BPs.  DM control per TRH & PCP.  If Myoview shows no evidence of ischemia, anticipate  d/c this PM.  If + for ischemia, but not HIGH RISK, may be able to d/c home with plan for OP cath.  Leonie Man, MD

## 2012-12-02 NOTE — Discharge Summary (Signed)
Physician Discharge Summary  AUM CAGGIANO FAO:[id number] DOB: 07-22-1955 DOA: 11/30/2012  PCP: Georgianne Fick, MD  Admit date: 11/30/2012 Discharge date: 12/02/2012  Time spent: >30 minutes  Recommendations for Outpatient Follow-up:  Follow-up Information   Follow up with [facility], MD. Schedule an appointment as soon as possible for a visit in 1 week.   Specialty:  Internal Medicine   Contact information:   [address] [place] [address] [phone]       Follow up with Chrystie Nose, MD. (office will call you)    Specialty:  Cardiology   Contact information:   [address] [place] [address] [phone]       Follow up with [facility], MD. (in1-2weeks, call for appt upon discharge)    Specialty:  Internal Medicine   Contact information:   [address] [place] [address] [phone]       Discharge Diagnoses:  Principal Problem:   Chest pain with moderate risk of acute coronary syndrome Active Problems:   Obesity   Hypertension   Hyperlipemia   Diabetes mellitus   CAD (coronary artery disease)- PCI 2001- no evaluation since   PTSD (post-traumatic stress disorder)   Hx of substance abuse- clean 15 months, in AA   Discharge Condition: improved  Diet recommendation: carb mod  Filed Weights   11/30/12 2050  Weight: 115.5 kg (254 lb 10.1 oz)    History of present illness:  57 year old obese male with history of hypertension, diabetes mellitus type 2 on oral hypoglycemics, hyperlipidemia, history of CAD with cardiac cath in 2001 requiring balloon dilatation of the coronary, PTSD who presented to the ED with acute onset of substernal chest pain this afternoon. He was reading at a local book store during the event. He describes the chest pain as someone sitting on the chest, 10/10 in intensity, nonradiating, without any aggravating or relieving factors. The pain lasted for  almost 5 minutes and patient was clammy and diaphoretic. His friend gave him a tablet of nitroglycerin after which the pain subsided. He denied any shortness of breath, palpitations, headache, dizziness, fever, chills, nausea, vomiting, abdominal pain, dysuria, bowel symptoms. Denies any trauma to the chest or recent heavy exercise or lifting heavy weights. He denies any recent travel he did he denies any recent stress except that he was feeling low since his close friend died of MI yesterday. Patient reports having similar symptoms on 2 occasions for possible weeks which were less severe and lasting for about 1-to 2 minutes only and self subsided.  The patient reports being compliant with his medications including baby aspirin. He reports quitting smoking for over one year and has been a heavy smoker for several years. At baseline he is fairly active   Hospital Course:  Chest pain at rest  As discussed above upon admission CEs were cycled and came back neg. Cardiology was consulted, saw pt and stress test was done due to multiple risk factors- came back with no evidence of ischemia. Discussed results with cards, pt was chest pain free and they recommneded to d/c pt and have him follow up with cards outpt. History of Hypertension  Stable. Continue home medications  Diabetes mellitus  SSI.  metformin and glipizide were held inpt,,  and invokana continued. A1C is 6.1. He is to continue all his outpt meds on d/c  Hyperlipemia  Continue statin.  History of CAD (coronary artery disease)  Hx of cardiac cath in 2001 by Dr  Tysinger with balloon angioplasty. Continue ASA and statin, follow up with cards outpt.  Hyponatremia  Improved. Na was 133 prior to dc     Procedures:  Stress test IMPRESSION:  No stress-induced ischemia. There is attenuation of the antral  lateral apex as described.  Echo Study Conclusions  - Left ventricle: The cavity size was normal. There was mild concentric  hypertrophy. Systolic function was normal. The estimated ejection fraction was in the range of 55% to 60%. Wall motion was normal; there were no regional wall motion abnormalities. Doppler parameters are consistent with abnormal left ventricular relaxation (grade 1 diastolic dysfunction). The E/e' ratio is <10, suggesting normal LV filling pressure. - Left atrium: The atrium was normal in size. - Inferior vena cava: The vessel was normal in size; the respirophasic diameter changes were in the normal range (= 50%); findings are consistent with normal central venous pressure.  Consultations:  cards  Discharge Exam: Filed Vitals:   12/02/12 1443  BP: 122/73  Pulse: 89  Temp: 98.2 F (36.8 C)  Resp: 18    Exam:  General: alert & oriented x 3 In NAD Cardiovascular: RRR, nl S1 s2 Respiratory: CTAB Abdomen: soft +BS NT/ND, no masses palpable Extremities: No cyanosis and no edema    Discharge Instructions  Discharge Orders   Future Appointments Provider Department Dept Phone   12/22/2012 8:30 AM [facility] Previsit [facility] [phone]   01/12/2013 11:30 AM Louis Meckel, MD [facility] [phone]   Future Orders Complete By Expires   Diet Carb Modified  As directed    Increase activity slowly  As directed        Medication List         amLODipine 10 MG tablet  Commonly known as:  NORVASC  Take 10 mg by mouth daily.     aspirin EC 81 MG tablet  Take 81 mg by mouth daily.     atorvastatin 40 MG tablet  Commonly known as:  LIPITOR  Take 1 tablet (40 mg total) by mouth daily at 6 PM.     gabapentin 400 MG capsule  Commonly known as:  NEURONTIN  Take 400 mg by mouth 2 (two) times daily.     glipiZIDE 5 MG tablet  Commonly known as:  GLUCOTROL  Take 5 mg by mouth 2 (two) times daily before a meal.     INVOKANA 300 MG Tabs  Generic drug:  Canagliflozin  Take 1 tablet by mouth daily.     lisinopril 40  MG tablet  Commonly known as:  PRINIVIL,ZESTRIL  Take 40 mg by mouth daily.     metFORMIN 1000 MG tablet  Commonly known as:  GLUCOPHAGE  Take 1,000 mg by mouth 2 (two) times daily with a meal.       Allergies  Allergen Reactions  . Other Other (See Comments)    Patient is recovering from drug addiction does not want any narcotics       Follow-up Information   Follow up with [facility], MD. Schedule an appointment as soon as possible for a visit in 1 week.   Specialty:  Internal Medicine   Contact information:   [address] [place] [address] [place] [address] [phone]       Follow up with Chrystie Nose, MD. (office will call you)    Specialty:  Cardiology   Contact information:   [address] [place] [address] [place] [address] [phone]  Follow up with Georgianne Fick, MD. (in1-2weeks, call for appt upon discharge)    Specialty:  Internal Medicine   Contact information:   [address] [place] [address] [place] [address] [phone]        The results of significant diagnostics from this hospitalization (including imaging, microbiology, ancillary and laboratory) are listed below for reference.    Significant Diagnostic Studies: Dg Chest 2 View  11/30/2012   CLINICAL DATA:  Chest pain  EXAM: CHEST  2 VIEW  COMPARISON:  06/06/2011  FINDINGS: Cardiomediastinal silhouette is stable. No acute infiltrate or pleural effusion. No pulmonary edema. Stable minimal degenerative changes thoracic spine.  IMPRESSION: No active cardiopulmonary disease.   Electronically Signed   By: Natasha Mead M.D.   On: 11/30/2012 17:26   Nm Myocar Multi W/spect W/wall Motion / Ef  12/02/2012   CLINICAL DATA:  Chest pain  EXAM: MYOCARDIAL IMAGING WITH SPECT (REST AND PHARMACOLOGIC-STRESS)  GATED LEFT VENTRICULAR WALL MOTION STUDY  LEFT VENTRICULAR EJECTION FRACTION  TECHNIQUE: Standard myocardial SPECT imaging was performed after resting intravenous injection of  10 mCi Tc-97m sestamibi. Subsequently, intravenous infusion of Lexiscan was performed under the supervision of the Cardiology staff. At peak effect of the drug, 30 mCi Tc-29m sestamibi was injected intravenously and standard myocardial SPECT imaging was performed. Quantitative gated imaging was also performed to evaluate left ventricular wall motion, and estimate left ventricular ejection fraction.  COMPARISON:  None.  FINDINGS: SPECT: There is fixed thinning of the antral lateral apical region. No stress-induced ischemia. The area in question does thickened on gated imaging therefore it is most likely attenuation.  Wall motion:  Normal motion.  Ejection fraction: 58%. End-diastolic volume 100 cc. End systolic volume 42 cc.  IMPRESSION: No stress-induced ischemia. There is attenuation of the antral lateral apex as described.   Electronically Signed   By: Maryclare Bean M.D.   On: 12/02/2012 14:30    Microbiology: No results found for this or any previous visit (from the past 240 hour(s)).   Labs: Basic Metabolic Panel:  Recent Labs Lab 11/30/12 1720 12/01/12 0305  NA 131* 133*  K 4.1 4.8  CL 99 98  CO2 21 27  GLUCOSE 88 108*  BUN 13 13  CREATININE 0.73 0.95  CALCIUM 9.6 9.2   Liver Function Tests: No results found for this basename: AST, ALT, ALKPHOS, BILITOT, PROT, ALBUMIN,  in the last 168 hours No results found for this basename: LIPASE, AMYLASE,  in the last 168 hours No results found for this basename: AMMONIA,  in the last 168 hours CBC:  Recent Labs Lab 11/30/12 1720  WBC 8.1  HGB 14.9  HCT 42.3  MCV 91.8  PLT 168   Cardiac Enzymes:  Recent Labs Lab 11/30/12 2057 12/01/12 0305 12/01/12 0857  TROPONINI <0.30 <0.30 <0.30   BNP: BNP (last 3 results) No results found for this basename: PROBNP,  in the last 8760 hours CBG:  Recent Labs Lab 12/01/12 1104 12/01/12 1744 12/01/12 2108 12/02/12 0728 12/02/12 1441  GLUCAP 100* 102* 146* 131* 163*        Signed:  Naureen [name] C  [facility] 12/02/2012, 4:50 PM

## 2012-12-02 NOTE — Progress Notes (Signed)
Myoview results noted -- No evidence to suggest Ischemia or infarction. This would suggest a non-cardiac / non-anginal etiology of CP.  Would be OK to d.c from Cardiology standpoint.  We will be happy to arrange ROV with Dr. Debara Pickett for f/u.   Leonie Man, MD

## 2012-12-17 ENCOUNTER — Ambulatory Visit: Payer: BC Managed Care – PPO | Admitting: Internal Medicine

## 2012-12-22 ENCOUNTER — Ambulatory Visit (AMBULATORY_SURGERY_CENTER): Payer: Self-pay | Admitting: *Deleted

## 2012-12-22 VITALS — Ht 71.0 in | Wt 257.4 lb

## 2012-12-22 DIAGNOSIS — Z85038 Personal history of other malignant neoplasm of large intestine: Secondary | ICD-10-CM

## 2012-12-22 MED ORDER — NA SULFATE-K SULFATE-MG SULF 17.5-3.13-1.6 GM/177ML PO SOLN: 1.0000 | Freq: Once | ORAL | Status: DC

## 2012-12-22 NOTE — Progress Notes (Signed)
No allergies to eggs or soy. No problems with anesthesia.

## 2013-01-04 ENCOUNTER — Encounter: Payer: Self-pay | Admitting: Gastroenterology

## 2013-01-12 ENCOUNTER — Encounter: Payer: Self-pay | Admitting: Gastroenterology

## 2013-01-12 ENCOUNTER — Ambulatory Visit (AMBULATORY_SURGERY_CENTER): Payer: BC Managed Care – PPO | Admitting: Gastroenterology

## 2013-01-12 VITALS — BP 108/62 | HR 76 | Temp 97.9°F | Resp 12 | Ht 71.0 in | Wt 257.0 lb

## 2013-01-12 DIAGNOSIS — Z85038 Personal history of other malignant neoplasm of large intestine: Secondary | ICD-10-CM

## 2013-01-12 DIAGNOSIS — D126 Benign neoplasm of colon, unspecified: Secondary | ICD-10-CM

## 2013-01-12 LAB — GLUCOSE, CAPILLARY
Glucose-Capillary: 101 mg/dL — ABNORMAL HIGH (ref 70–99)
Glucose-Capillary: 101 mg/dL — ABNORMAL HIGH (ref 70–99)

## 2013-01-12 MED ORDER — SODIUM CHLORIDE 0.9 % IV SOLN: 500.0000 mL | INTRAVENOUS | Status: DC

## 2013-01-12 NOTE — Patient Instructions (Signed)
Discharge instructions given with verbal understanding. Handout on polyps. Resume previous medications. YOU HAD AN ENDOSCOPIC PROCEDURE TODAY AT [facility]: Refer to the procedure report that was given to you for any specific questions about what was found during the examination.  If the procedure report does not answer your questions, please call your gastroenterologist to clarify.  If you requested that your care partner not be given the details of your procedure findings, then the procedure report has been included in a sealed envelope for you to review at your convenience later.  YOU SHOULD EXPECT: Some feelings of bloating in the abdomen. Passage of more gas than usual.  Walking can help get rid of the air that was put into your GI tract during the procedure and reduce the bloating. If you had a lower endoscopy (such as a colonoscopy or flexible sigmoidoscopy) you may notice spotting of blood in your stool or on the toilet paper. If you underwent a bowel prep for your procedure, then you may not have a normal bowel movement for a few days.  DIET: Your first meal following the procedure should be a light meal and then it is ok to progress to your normal diet.  A half-sandwich or bowl of soup is an example of a good first meal.  Heavy or fried foods are harder to digest and may make you feel nauseous or bloated.  Likewise meals heavy in dairy and vegetables can cause extra gas to form and this can also increase the bloating.  Drink plenty of fluids but you should avoid alcoholic beverages for 24 hours.  ACTIVITY: Your care partner should take you home directly after the procedure.  You should plan to take it easy, moving slowly for the rest of the day.  You can resume normal activity the day after the procedure however you should NOT DRIVE or use heavy machinery for 24 hours (because of the sedation medicines used during the test).    SYMPTOMS TO REPORT IMMEDIATELY: A  gastroenterologist can be reached at any hour.  During normal business hours, 8:30 AM to 5:00 PM Monday through Friday, call [phone].  After hours and on weekends, please call the GI answering service at [phone] who will take a message and have the physician on call contact you.   Following lower endoscopy (colonoscopy or flexible sigmoidoscopy):  Excessive amounts of blood in the stool  Significant tenderness or worsening of abdominal pains  Swelling of the abdomen that is new, acute  Fever of 100F or higher  FOLLOW UP: If any biopsies were taken you will be contacted by phone or by letter within the next 1-3 weeks.  Call your gastroenterologist if you have not heard about the biopsies in 3 weeks.  Our staff will call the home number listed on your records the next business day following your procedure to check on you and address any questions or concerns that you may have at that time regarding the information given to you following your procedure. This is a courtesy call and so if there is no answer at the home number and we have not heard from you through the emergency physician on call, we will assume that you have returned to your regular daily activities without incident.  SIGNATURES/CONFIDENTIALITY: You and/or your care partner have signed paperwork which will be entered into your electronic medical record.  These signatures attest to the fact that that the information above on your After Visit Summary has been  reviewed and is understood.  Full responsibility of the confidentiality of this discharge information lies with you and/or your care-partner.

## 2013-01-12 NOTE — Op Note (Addendum)
[facility]  [address] [place], [address]   COLONOSCOPY PROCEDURE REPORT  PATIENT: Matthew Lewis, Matthew Lewis  MR#: [id number] BIRTHDATE: July 07, 1955 , 57  yrs. old GENDER: Male ENDOSCOPIST: Inda Castle, MD REFERRED BY: PROCEDURE DATE:  01/12/2013 PROCEDURE:   Colonoscopy with snare polypectomy First Screening Colonoscopy - Avg.  risk and is 50 yrs.  old or older - No.  Prior Negative Screening - Now for repeat screening. N/A  History of Adenoma - Now for follow-up colonoscopy & has been > or = to 3 yrs.  Yes hx of adenoma.  Has been 3 or more years since last colonoscopy.  Polyps Removed Today? Yes. ASA CLASS:   Class II INDICATIONS:Patient's personal history of colon polyps 2008 in 2011. History of colon cancer resected 2007 MEDICATIONS: MAC sedation, administered by CRNA and Propofol (Diprivan) 220 mg IV  DESCRIPTION OF PROCEDURE:   After the risks benefits and alternatives of the procedure were thoroughly explained, informed consent was obtained.  A digital rectal exam revealed no abnormalities of the rectum.   The LB [id number]  endoscope was introduced through the anus and advanced to the surgical anastomosis. No adverse events experienced.   The quality of the prep was Suprep adequate  The instrument was then slowly withdrawn as the colon was fully examined.      COLON FINDINGS: Two polyps measuring 3 mm in size were found in the proximal descending colon. one polyp was flat and the other sessile. A polypectomy was performed with a cold snare.  The resection was complete and the polyp tissue was completely retrieved. In the sigmoid colon there were multiple hyperplastic appearing polyps measuring approximately 2 mm characterized by smooth overlying mucosa.  Remainder of exam was normal. Retroflexed views revealed no abnormalities. The time to anastamosis= 0 minutes 42 seconds.  Withdrawal time=8 minutes 33 seconds.  The scope was withdrawn and  the procedure completed. COMPLICATIONS: There were no complications.  ENDOSCOPIC IMPRESSION: 1.  colon polyp 2.  postoperative changes  RECOMMENDATIONS:  followup colonoscopy 5 years  eSigned:  Inda Castle, MD 01/12/2013 11:54 AM Revised: 01/12/2013 11:54 AM  cc:   PATIENT NAME:  Renardo, Cheatum MR#: [id number]

## 2013-01-12 NOTE — Progress Notes (Signed)
Called to room to assist during endoscopic procedure.  Patient ID and intended procedure confirmed with present staff. Received instructions for my participation in the procedure from the performing physician.  

## 2013-01-12 NOTE — Progress Notes (Signed)
Report to pacu RN, vss, bbs=clear 

## 2013-01-13 ENCOUNTER — Telehealth: Payer: Self-pay | Admitting: *Deleted

## 2013-01-13 NOTE — Telephone Encounter (Signed)
  Follow up Call-  Call back number 01/12/2013  Post procedure Call Back phone  # [phone]  Permission to leave phone message Yes     Left message on answering machine to call us back if experiencing problems or has questions

## 2013-01-19 ENCOUNTER — Encounter: Payer: Self-pay | Admitting: Gastroenterology

## 2013-11-03 ENCOUNTER — Emergency Department (HOSPITAL_COMMUNITY): Payer: BC Managed Care – PPO

## 2013-11-03 ENCOUNTER — Encounter (HOSPITAL_COMMUNITY): Payer: Self-pay | Admitting: Emergency Medicine

## 2013-11-03 ENCOUNTER — Emergency Department (HOSPITAL_COMMUNITY)
Admission: EM | Admit: 2013-11-03 | Discharge: 2013-11-03 | Disposition: A | Discharge status: Home / Self Care | Payer: BC Managed Care – PPO | Attending: Emergency Medicine | Admitting: Emergency Medicine

## 2013-11-03 DIAGNOSIS — Z8719 Personal history of other diseases of the digestive system: Secondary | ICD-10-CM | POA: Insufficient documentation

## 2013-11-03 DIAGNOSIS — E1142 Type 2 diabetes mellitus with diabetic polyneuropathy: Secondary | ICD-10-CM | POA: Diagnosis not present

## 2013-11-03 DIAGNOSIS — X58XXXA Exposure to other specified factors, initial encounter: Secondary | ICD-10-CM | POA: Insufficient documentation

## 2013-11-03 DIAGNOSIS — Z85038 Personal history of other malignant neoplasm of large intestine: Secondary | ICD-10-CM | POA: Diagnosis not present

## 2013-11-03 DIAGNOSIS — Y9389 Activity, other specified: Secondary | ICD-10-CM | POA: Diagnosis not present

## 2013-11-03 DIAGNOSIS — Z87891 Personal history of nicotine dependence: Secondary | ICD-10-CM | POA: Insufficient documentation

## 2013-11-03 DIAGNOSIS — M5136 Other intervertebral disc degeneration, lumbar region: Secondary | ICD-10-CM | POA: Diagnosis not present

## 2013-11-03 DIAGNOSIS — R52 Pain, unspecified: Secondary | ICD-10-CM

## 2013-11-03 DIAGNOSIS — Z8659 Personal history of other mental and behavioral disorders: Secondary | ICD-10-CM | POA: Diagnosis not present

## 2013-11-03 DIAGNOSIS — Y9289 Other specified places as the place of occurrence of the external cause: Secondary | ICD-10-CM | POA: Insufficient documentation

## 2013-11-03 DIAGNOSIS — Z9889 Other specified postprocedural states: Secondary | ICD-10-CM | POA: Insufficient documentation

## 2013-11-03 DIAGNOSIS — Z9861 Coronary angioplasty status: Secondary | ICD-10-CM | POA: Insufficient documentation

## 2013-11-03 DIAGNOSIS — M1990 Unspecified osteoarthritis, unspecified site: Secondary | ICD-10-CM | POA: Insufficient documentation

## 2013-11-03 DIAGNOSIS — Z79899 Other long term (current) drug therapy: Secondary | ICD-10-CM | POA: Insufficient documentation

## 2013-11-03 DIAGNOSIS — Z7982 Long term (current) use of aspirin: Secondary | ICD-10-CM | POA: Insufficient documentation

## 2013-11-03 DIAGNOSIS — Z72820 Sleep deprivation: Secondary | ICD-10-CM | POA: Diagnosis not present

## 2013-11-03 DIAGNOSIS — S39012A Strain of muscle, fascia and tendon of lower back, initial encounter: Secondary | ICD-10-CM | POA: Insufficient documentation

## 2013-11-03 DIAGNOSIS — G629 Polyneuropathy, unspecified: Secondary | ICD-10-CM | POA: Diagnosis not present

## 2013-11-03 DIAGNOSIS — S3992XA Unspecified injury of lower back, initial encounter: Secondary | ICD-10-CM | POA: Diagnosis present

## 2013-11-03 MED ORDER — PREDNISONE 20 MG PO TABS: ORAL_TABLET | ORAL | Status: DC

## 2013-11-03 MED ORDER — PREDNISONE 20 MG PO TABS
60.0000 mg | ORAL_TABLET | Freq: Once | ORAL | Status: AC
  Administered 2013-11-03: 60 mg via ORAL
  Filled 2013-11-03: qty 3

## 2013-11-03 MED ORDER — HYDROCODONE-ACETAMINOPHEN 5-325 MG PO TABS: 1.0000 | ORAL_TABLET | Freq: Four times a day (QID) | ORAL | Status: DC | PRN

## 2013-11-03 MED ORDER — METHOCARBAMOL 750 MG PO TABS: 750.0000 mg | ORAL_TABLET | Freq: Three times a day (TID) | ORAL | Status: DC | PRN

## 2013-11-03 MED ORDER — METHOCARBAMOL 500 MG PO TABS
750.0000 mg | ORAL_TABLET | Freq: Once | ORAL | Status: AC
  Administered 2013-11-03: 750 mg via ORAL
  Filled 2013-11-03: qty 2

## 2013-11-03 NOTE — ED Notes (Signed)
Pt presents with severe mid back pain onset 2 weeks ago after lifting equipment. Pt states pain continues to progress worse tonight. Severe pain with movement.

## 2013-11-03 NOTE — Discharge Instructions (Signed)
It was our pleasure to provide your ER care today - we hope that you feel better.  Take prednisone as prescribed.  Take robaxin as need for muscle pain/spasm - no driving when taking.  You may take vicodin as need for pain. No driving when taking vicodin. Also, do not take tylenol or acetaminophen containing medication when taking vicodin.  As we discussed, given history, have your spouse dispense medication.  Avoid bending at waist or heavy lifting more than 20 lbs for the next week. Try heat/heating pad to sore area. Follow up with primary care doctor in coming week for recheck - discuss referral to physical therapy, and back specialist for further workup if symptoms fail to improve/resolve.   Return to ER if worse, leg numbness/weakness, intractable pain, fevers, other concern.       Degenerative Disk Disease Degenerative disk disease is a condition caused by the changes that occur in the cushions of the backbone (spinal disks) as you grow older. Spinal disks are soft and compressible disks located between the bones of the spine (vertebrae). They act like shock absorbers. Degenerative disk disease can affect the whole spine. However, the neck and lower back are most commonly affected. Many changes can occur in the spinal disks with aging, such as:  The spinal disks may dry and shrink.  Small tears may occur in the tough, outer covering of the disk (annulus).  The disk space may become smaller due to loss of water.  Abnormal growths in the bone (spurs) may occur. This can put pressure on the nerve roots exiting the spinal canal, causing pain.  The spinal canal may become narrowed. CAUSES  Degenerative disk disease is a condition caused by the changes that occur in the spinal disks with aging. The exact cause is not known, but there is a genetic basis for many patients. Degenerative changes can occur due to loss of fluid in the disk. This makes the disk thinner and reduces the space  between the backbones. Small cracks can develop in the outer layer of the disk. This can lead to the breakdown of the disk. You are more likely to get degenerative disk disease if you are overweight. Smoking cigarettes and doing heavy work such as weightlifting can also increase your risk of this condition. Degenerative changes can start after a sudden injury. Growth of bone spurs can compress the nerve roots and cause pain.  SYMPTOMS  The symptoms vary from person to person. Some people may have no pain, while others have severe pain. The pain may be so severe that it can limit your activities. The location of the pain depends on the part of your backbone that is affected. You will have neck or arm pain if a disk in the neck area is affected. You will have pain in your back, buttocks, or legs if a disk in the lower back is affected. The pain becomes worse while bending, reaching up, or with twisting movements. The pain may start gradually and then get worse as time passes. It may also start after a major or minor injury. You may feel numbness or tingling in the arms or legs.  DIAGNOSIS  Your caregiver will ask you about your symptoms and about activities or habits that may cause the pain. He or she may also ask about any injuries, diseases, or treatments you have had earlier. Your caregiver will examine you to check for the range of movement that is possible in the affected area, to check for  strength in your extremities, and to check for sensation in the areas of the arms and legs supplied by different nerve roots. An X-ray of the spine may be taken. Your caregiver may suggest other imaging tests, such as magnetic resonance imaging (MRI), if needed.  TREATMENT  Treatment includes rest, modifying your activities, and applying ice and heat. Your caregiver may prescribe medicines to reduce your pain and may ask you to do some exercises to strengthen your back. In some cases, you may need surgery. You and your  caregiver will decide on the treatment that is best for you. HOME CARE INSTRUCTIONS   Follow proper lifting and walking techniques as advised by your caregiver.  Maintain good posture.  Exercise regularly as advised.  Perform relaxation exercises.  Change your sitting, standing, and sleeping habits as advised. Change positions frequently.  Lose weight as advised.  Stop smoking if you smoke.  Wear supportive footwear. SEEK MEDICAL CARE IF:  Your pain does not go away within 1 to 4 weeks. SEEK IMMEDIATE MEDICAL CARE IF:   Your pain is severe.  You notice weakness in your arms, hands, or legs.  You begin to lose control of your bladder or bowel movements. MAKE SURE YOU:   Understand these instructions.  Will watch your condition.  Will get help right away if you are not doing well or get worse. Document Released: 10/21/2006 Document Revised: 03/18/2011 Document Reviewed: 04/27/2013 [facility] Patient Information 2015 [place], [place]. This information is not intended to replace advice given to you by your health care provider. Make sure you discuss any questions you have with your health care provider.    Lumbosacral Strain Lumbosacral strain is a strain of any of the parts that make up your lumbosacral vertebrae. Your lumbosacral vertebrae are the bones that make up the lower third of your backbone. Your lumbosacral vertebrae are held together by muscles and tough, fibrous tissue (ligaments).  CAUSES  A sudden blow to your back can cause lumbosacral strain. Also, anything that causes an excessive stretch of the muscles in the low back can cause this strain. This is typically seen when people exert themselves strenuously, fall, lift heavy objects, bend, or crouch repeatedly. RISK FACTORS  Physically demanding work.  Participation in pushing or pulling sports or sports that require a sudden twist of the back (tennis, golf, baseball).  Weight lifting.  Excessive lower back  curvature.  Forward-tilted pelvis.  Weak back or abdominal muscles or both.  Tight hamstrings. SIGNS AND SYMPTOMS  Lumbosacral strain may cause pain in the area of your injury or pain that moves (radiates) down your leg.  DIAGNOSIS Your health care provider can often diagnose lumbosacral strain through a physical exam. In some cases, you may need tests such as X-ray exams.  TREATMENT  Treatment for your lower back injury depends on many factors that your clinician will have to evaluate. However, most treatment will include the use of anti-inflammatory medicines. HOME CARE INSTRUCTIONS   Avoid hard physical activities (tennis, racquetball, waterskiing) if you are not in proper physical condition for it. This may aggravate or create problems.  If you have a back problem, avoid sports requiring sudden body movements. Swimming and walking are generally safer activities.  Maintain good posture.  Maintain a healthy weight.  For acute conditions, you may put ice on the injured area.  Put ice in a plastic bag.  Place a towel between your skin and the bag.  Leave the ice on for 20 minutes, 2-3  times a day.  When the low back starts healing, stretching and strengthening exercises may be recommended. SEEK MEDICAL CARE IF:  Your back pain is getting worse.  You experience severe back pain not relieved with medicines. SEEK IMMEDIATE MEDICAL CARE IF:   You have numbness, tingling, weakness, or problems with the use of your arms or legs.  There is a change in bowel or bladder control.  You have increasing pain in any area of the body, including your belly (abdomen).  You notice shortness of breath, dizziness, or feel faint.  You feel sick to your stomach (nauseous), are throwing up (vomiting), or become sweaty.  You notice discoloration of your toes or legs, or your feet get very cold. MAKE SURE YOU:   Understand these instructions.  Will watch your condition.  Will get help  right away if you are not doing well or get worse. Document Released: 10/03/2004 Document Revised: 12/29/2012 Document Reviewed: 08/12/2012 [facility] Patient Information 2015 [place], [place]. This information is not intended to replace advice given to you by your health care provider. Make sure you discuss any questions you have with your health care provider.    Heat Therapy Heat therapy can help ease sore, stiff, injured, and tight muscles and joints. Heat relaxes your muscles, which may help ease your pain.  RISKS AND COMPLICATIONS If you have any of the following conditions, do not use heat therapy unless your health care provider has approved:  Poor circulation.  Healing wounds or scarred skin in the area being treated.  Diabetes, heart disease, or high blood pressure.  Not being able to feel (numbness) the area being treated.  Unusual swelling of the area being treated.  Active infections.  Blood clots.  Cancer.  Inability to communicate pain. This may include young children and people who have problems with their brain function (dementia).  Pregnancy. Heat therapy should only be used on old, pre-existing, or long-lasting (chronic) injuries. Do not use heat therapy on new injuries unless directed by your health care provider. HOW TO USE HEAT THERAPY There are several different kinds of heat therapy, including:  Moist heat pack.  Warm water bath.  Hot water bottle.  Electric heating pad.  Heated gel pack.  Heated wrap.  Electric heating pad. Use the heat therapy method suggested by your health care provider. Follow your health care provider's instructions on when and how to use heat therapy. GENERAL HEAT THERAPY RECOMMENDATIONS  Do not sleep while using heat therapy. Only use heat therapy while you are awake.  Your skin may turn pink while using heat therapy. Do not use heat therapy if your skin turns red.  Do not use heat therapy if you have new  pain.  High heat or long exposure to heat can cause burns. Be careful when using heat therapy to avoid burning your skin.  Do not use heat therapy on areas of your skin that are already irritated, such as with a rash or sunburn. SEEK MEDICAL CARE IF:  You have blisters, redness, swelling, or numbness.  You have new pain.  Your pain is worse. MAKE SURE YOU:  Understand these instructions.  Will watch your condition.  Will get help right away if you are not doing well or get worse. Document Released: 03/18/2011 Document Revised: 05/10/2013 Document Reviewed: 02/16/2013 [facility] Patient Information 2015 [place], [place]. This information is not intended to replace advice given to you by your health care provider. Make sure you discuss any questions you have with  your health care provider.

## 2013-11-03 NOTE — ED Provider Notes (Signed)
Medical screening examination/treatment/procedure(s) were conducted as a shared visit with non-physician practitioner(s) and myself.  I personally evaluated the patient during the encounter.  Pt drove self. C/o low back pain, recent strain/heavy lifting, hx ddd.  No leg numbness/weakness. No urinary retention or incontinence. No fevers. Straight leg raise + on left. Reviewed imaging w pt.   Pt notes remote hx opiate abuse. Discussed pain rx options.  Pt states pain not controlled w otc meds, and that wants small quantity narcotic pain med to help manage pain, states his spouse will manage/dispense meds. Discussed close pcp, phys there, and spine specialist follow up.     Mirna Mires, MD 11/03/13 [phone]

## 2013-11-03 NOTE — ED Notes (Signed)
Pt returned from CT via stretcher.

## 2013-11-03 NOTE — ED Provider Notes (Signed)
CSN: [id number]     Arrival date & time 11/03/13  0434 History   First MD Initiated Contact with Patient 11/03/13 0445     Chief Complaint  Patient presents with  . Back Pain     (Consider location/radiation/quality/duration/timing/severity/associated sxs/prior Treatment) HPI Comments: Patieint with Hx of several back surgeries and diabetic neuropathy now with persistent low back pain since moving heavy furniture. He is a recovering narcotic abuser who would NOT like to have narcotic to treat his condition. Denies incontinence, numbness.  Has been using OTC meds, rest, ice, heat with little relief.   States pain is worse when standing and when first changing position   Patient is a 58 y.o. male presenting with back pain. The history is provided by the patient.  Back Pain Location:  Lumbar spine Quality:  Aching Radiates to:  Does not radiate Pain severity:  Moderate Pain is:  Same all the time Onset quality:  Unable to specify Timing:  Constant Progression:  Unchanged Chronicity:  Recurrent Context: lifting heavy objects   Relieved by:  Nothing Worsened by:  Standing Ineffective treatments:  Being still, cold packs, heating pad and ibuprofen Associated symptoms: no bladder incontinence, no bowel incontinence, no dysuria, no fever, no numbness, no paresthesias, no perianal numbness, no tingling and no weakness     Past Medical History  Diagnosis Date  . Fistula, anal     multiple  . Abscess of anal and rectal regions     horse shoe abscess  . Poor circulation   . Fatigue   . Sleep deprivation   . Colon cancer 05/2005  . Type II diabetes mellitus   . DM type 2 with diabetic peripheral neuropathy     left foot  . Arthritis     "everywhere; especially in my spine"  . PTSD (post-traumatic stress disorder)     "severe"  . History of substance abuse     last alcohol was 08/2011; last marijuania was 08/2011   Past Surgical History  Procedure Laterality Date  . Hemicolectomy   06/06/2005    left  . Incision and drainage perirectal abscess  03/30/2005  . Examination under anesthesia  03/22/2005    fistula  . Incision and drainage perirectal abscess  12/13/2004  . Incision and drainage perirectal abscess  07/14/2001  . Anal fistulectomy  12/13/1998  . Incision and drainage perirectal abscess  08/12/2010    horseshoe abscess; Dr Redmond Pulling  . Lumbar disc surgery  1987  . Thoracic discectomy  [id number]  . Anterior fusion cervical spine  2010    "triple"; "did 2 surgeries on the same day in 8315 due to complications"  . Abscess drainage      "probably 15-20 so far at least; rectal"  . Coronary angioplasty  2001  . Detached muscle  2010    right chest; "after cervical fusion complications"  . Elbow surgery  2007    "cut out part of a muscle"; left  . Percutaneous pinning phalanx fracture of hand  ~ 2008    "plates in 2 places"; right  . Examination under anesthesia  06/06/2011    Procedure: EXAM UNDER ANESTHESIA;  Surgeon: Stark Klein, MD;  Location: [facility];  Service: General;  Laterality: N/A;  . Incision and drainage perirectal abscess  06/06/2011    Procedure: IRRIGATION AND DEBRIDEMENT PERIRECTAL ABSCESS;  Surgeon: Stark Klein, MD;  Location: [facility] OR;  Service: General;  Laterality: N/A;   Family History  Problem Relation Age of Onset  .  Heart disease Father   . Heart disease Mother   . Cancer Mother   . Heart disease Brother   . Cancer Sister    History  Substance Use Topics  . Smoking status: Former Smoker -- 1.00 packs/day for 10 years    Types: Cigarettes    Quit date: 01/08/1991  . Smokeless tobacco: Never Used  . Alcohol Use: Yes     Comment: 06/05/11 "drank enough when I was younger to last me my whole life; don't remember when I had my last drink, maybe 2012"    Review of Systems  Constitutional: Negative for fever.  Gastrointestinal: Negative for bowel incontinence.  Genitourinary: Negative for bladder incontinence, dysuria and urgency.   Musculoskeletal: Positive for back pain.  Skin: Negative for rash and wound.  Neurological: Negative for dizziness, tingling, weakness, numbness and paresthesias.  All other systems reviewed and are negative.     Allergies  Other  Home Medications   Prior to Admission medications   Medication Sig Start Date End Date Taking? Authorizing Provider  amLODipine (NORVASC) 10 MG tablet Take 10 mg by mouth daily.   Yes Historical Provider, MD  aspirin EC 81 MG tablet Take 81 mg by mouth daily.   Yes Historical Provider, MD  atorvastatin (LIPITOR) 40 MG tablet Take 1 tablet (40 mg total) by mouth daily at 6 PM. 12/02/12  Yes Adeline C Viyuoh, MD  gabapentin (NEURONTIN) 400 MG capsule Take 400 mg by mouth 2 (two) times daily.   Yes Historical Provider, MD  glipiZIDE (GLUCOTROL) 5 MG tablet Take 5 mg by mouth 2 (two) times daily before a meal.   Yes Historical Provider, MD  ibuprofen (ADVIL,MOTRIN) 200 MG tablet Take 200 mg by mouth every 6 (six) hours as needed for moderate pain.   Yes Historical Provider, MD  INVOKANA 300 MG TABS Take 1 tablet by mouth daily. 11/22/12  Yes Historical Provider, MD  lisinopril (PRINIVIL,ZESTRIL) 40 MG tablet Take 40 mg by mouth daily.   Yes Historical Provider, MD  metFORMIN (GLUCOPHAGE) 1000 MG tablet Take 1,000 mg by mouth 2 (two) times daily with a meal.   Yes Historical Provider, MD  Multiple Vitamin (MULTIVITAMIN) tablet Take 1 tablet by mouth daily.   Yes Historical Provider, MD  naphazoline-pheniramine (NAPHCON-A) 0.025-0.3 % ophthalmic solution Place 1 drop into both eyes 4 (four) times daily as needed for irritation.   Yes Historical Provider, MD  methocarbamol (ROBAXIN) 750 MG tablet Take 1 tablet (750 mg total) by mouth every 8 (eight) hours as needed for muscle spasms. 11/03/13   Garald Balding, NP  predniSONE (DELTASONE) 20 MG tablet 3 Tabs PO Days 1-3, then 2 tabs PO Days 4-6, then 1 tab PO Day 7-9, then Half Tab PO Day 10-12 11/03/13   Garald Balding,  NP   BP 150/87  Pulse 88  Temp(Src) 97.7 F (36.5 C) (Oral)  Resp 18  Ht 5\' 11"  (1.803 m)  Wt 260 lb (117.935 kg)  BMI 36.28 kg/m2  SpO2 97% Physical Exam  Nursing note and vitals reviewed. Constitutional: He is oriented to person, place, and time. He appears well-developed and well-nourished.  HENT:  Head: Normocephalic.  Eyes: Pupils are equal, round, and reactive to light.  Neck: Normal range of motion.  Cardiovascular: Normal rate and regular rhythm.   Pulmonary/Chest: Effort normal.  Abdominal: Soft.  Musculoskeletal: Normal range of motion. He exhibits no edema and no tenderness.  Gait is slow   Neurological: He is alert and  oriented to person, place, and time.  Chronic neuropathy   Skin: Skin is warm.    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 History of colon cancer and back surgery 2009.  EXAM: CT LUMBAR SPINE WITHOUT CONTRAST  TECHNIQUE: Multidetector CT imaging of the lumbar spine was performed without intravenous contrast administration. Multiplanar CT image reconstructions were also generated.  COMPARISON:  CT of the pelvis Jun 05, 2011  FINDINGS: Using the reference level of the last well-formed intervertebral disc as L5-S1, grade 1 L5-S1 anterolisthesis with bilateral chronic L5 pars interarticularis defects. Grade 1 L3-4 and L4-5 retrolisthesis. Straightened lumbar lordosis. Severe L3-4 through L5-S1 disc degeneration with vacuum disc, moderate at L1-2 and L2-3. No destructive bony lesions. L4-5 spinous intra-articular sclerosus. Mild calcific atherosclerosis of the aorta. At least moderate bilateral sacroiliac osteoarthrosis of incompletely imaged.  Level by level evaluation:  L1-2: Small to moderate broad-based disc osteophyte complex. Mild facet arthropathy  and ligamentum flavum redundancy. Mild canal stenosis. Minimal neural foraminal narrowing.  L2-3: Small to moderate broad-based disc osteophyte complex asymmetric to the LEFT. Moderate facet arthropathy and ligamentum flavum redundancy. Mild canal stenosis. Moderate bilateral neural foraminal narrowing.  L3-4: Retrolisthesis. Small broad-based disc osteophyte complex with superimposed RIGHT extra foraminal disc protrusion which may encroach upon the exited RIGHT L3 nerve. Mild facet arthropathy and ligamentum flavum redundancy. Moderate canal stenosis. Moderate RIGHT greater LEFT neural foraminal narrowing.  L4-5: Retrolisthesis. Severe facet arthropathy and interspinous arthropathy. Status post apparent LEFT L4 hemilaminectomy. Mild canal stenosis. Severe bilateral neural foraminal narrowing.  L5-S1: Anterolisthesis. Moderate to large broad-based disc osteophyte complex asymmetric laterally may affect the exited L5 nerve. Moderate canal stenosis. Severe bilateral neural foraminal narrowing.  IMPRESSION: No acute fracture.  Grade 1 L5-S1 anterolisthesis with chronic bilateral L5 pars interarticularis defects. Grade 1 L3-4 and L4-5 retrolisthesis with apparent LEFT L4-5 hemilaminectomy.  Moderate canal stenosis L3-4 and L5-S1, mild at L2-3 and L4-5.  Neural foraminal narrowing at all lumbar levels: Severe bilaterally at L4-5 and L5-S1.  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   Electronically Signed   By: Elon Alas   On: 11/03/2013 06:01     EKG Interpretation None    will obtain CT Scan Lumbar region, given Robaxin and Prednisone no narcotics give per patients request   MDM   Final diagnoses:  Pain  Chronic back pain         Garald Balding, NP 11/03/13 4975  Garald Balding, NP 11/03/13 [phone]

## 2013-11-03 NOTE — ED Notes (Signed)
Pt ambulating independently w/ steady gait on d/c in no acute distress, A&Ox4. Rx given x3

## 2013-11-03 NOTE — ED Notes (Signed)
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 Patient denies any prior evaluation for this problem. Patient states he has been taking ibuprofen at home, denies relief, last dose at 1600. Patient reports being a prior narcotic abuser. Patient states he has also used warm water soaks which have provided some relief.

## 2014-05-03 ENCOUNTER — Emergency Department (HOSPITAL_COMMUNITY)
Admission: EM | Admit: 2014-05-03 | Discharge: 2014-05-03 | Disposition: A | Discharge status: Home / Self Care | Payer: BLUE CROSS/BLUE SHIELD | Attending: Emergency Medicine | Admitting: Emergency Medicine

## 2014-05-03 ENCOUNTER — Encounter (HOSPITAL_COMMUNITY): Payer: Self-pay | Admitting: *Deleted

## 2014-05-03 DIAGNOSIS — Z8659 Personal history of other mental and behavioral disorders: Secondary | ICD-10-CM | POA: Diagnosis not present

## 2014-05-03 DIAGNOSIS — M5432 Sciatica, left side: Secondary | ICD-10-CM | POA: Diagnosis not present

## 2014-05-03 DIAGNOSIS — Z9889 Other specified postprocedural states: Secondary | ICD-10-CM | POA: Diagnosis not present

## 2014-05-03 DIAGNOSIS — M1990 Unspecified osteoarthritis, unspecified site: Secondary | ICD-10-CM | POA: Insufficient documentation

## 2014-05-03 DIAGNOSIS — Z79899 Other long term (current) drug therapy: Secondary | ICD-10-CM | POA: Diagnosis not present

## 2014-05-03 DIAGNOSIS — R52 Pain, unspecified: Secondary | ICD-10-CM | POA: Diagnosis present

## 2014-05-03 DIAGNOSIS — E1142 Type 2 diabetes mellitus with diabetic polyneuropathy: Secondary | ICD-10-CM | POA: Diagnosis not present

## 2014-05-03 DIAGNOSIS — Z87891 Personal history of nicotine dependence: Secondary | ICD-10-CM | POA: Diagnosis not present

## 2014-05-03 DIAGNOSIS — G629 Polyneuropathy, unspecified: Secondary | ICD-10-CM | POA: Diagnosis not present

## 2014-05-03 DIAGNOSIS — Z85038 Personal history of other malignant neoplasm of large intestine: Secondary | ICD-10-CM | POA: Insufficient documentation

## 2014-05-03 DIAGNOSIS — Z9861 Coronary angioplasty status: Secondary | ICD-10-CM | POA: Insufficient documentation

## 2014-05-03 DIAGNOSIS — Z8719 Personal history of other diseases of the digestive system: Secondary | ICD-10-CM | POA: Diagnosis not present

## 2014-05-03 DIAGNOSIS — Z7952 Long term (current) use of systemic steroids: Secondary | ICD-10-CM | POA: Insufficient documentation

## 2014-05-03 DIAGNOSIS — Z7982 Long term (current) use of aspirin: Secondary | ICD-10-CM | POA: Insufficient documentation

## 2014-05-03 MED ORDER — METHOCARBAMOL 500 MG PO TABS: 1000.0000 mg | ORAL_TABLET | Freq: Three times a day (TID) | ORAL | Status: DC

## 2014-05-03 MED ORDER — KETOROLAC TROMETHAMINE 60 MG/2ML IM SOLN
60.0000 mg | Freq: Once | INTRAMUSCULAR | Status: AC
  Administered 2014-05-03: 60 mg via INTRAMUSCULAR
  Filled 2014-05-03: qty 2

## 2014-05-03 MED ORDER — LIDOCAINE 5 % EX PTCH: 1.0000 | MEDICATED_PATCH | CUTANEOUS | Status: DC

## 2014-05-03 NOTE — Discharge Instructions (Signed)
Sciatica Sciatica is pain, weakness, numbness, or tingling along the path of the sciatic nerve. The nerve starts in the lower back and runs down the back of each leg. The nerve controls the muscles in the lower leg and in the back of the knee, while also providing sensation to the back of the thigh, lower leg, and the sole of your foot. Sciatica is a symptom of another medical condition. For instance, nerve damage or certain conditions, such as a herniated disk or bone spur on the spine, pinch or put pressure on the sciatic nerve. This causes the pain, weakness, or other sensations normally associated with sciatica. Generally, sciatica only affects one side of the body. CAUSES   Herniated or slipped disc.  Degenerative disk disease.  A pain disorder involving the narrow muscle in the buttocks (piriformis syndrome).  Pelvic injury or fracture.  Pregnancy.  Tumor (rare). SYMPTOMS  Symptoms can vary from mild to very severe. The symptoms usually travel from the low back to the buttocks and down the back of the leg. Symptoms can include:  Mild tingling or dull aches in the lower back, leg, or hip.  Numbness in the back of the calf or sole of the foot.  Burning sensations in the lower back, leg, or hip.  Sharp pains in the lower back, leg, or hip.  Leg weakness.  Severe back pain inhibiting movement. These symptoms may get worse with coughing, sneezing, laughing, or prolonged sitting or standing. Also, being overweight may worsen symptoms. DIAGNOSIS  Your caregiver will perform a physical exam to look for common symptoms of sciatica. He or she may ask you to do certain movements or activities that would trigger sciatic nerve pain. Other tests may be performed to find the cause of the sciatica. These may include:  Blood tests.  X-rays.  Imaging tests, such as an MRI or CT scan. TREATMENT  Treatment is directed at the cause of the sciatic pain. Sometimes, treatment is not necessary  and the pain and discomfort goes away on its own. If treatment is needed, your caregiver may suggest:  Over-the-counter medicines to relieve pain.  Prescription medicines, such as anti-inflammatory medicine, muscle relaxants, or narcotics.  Applying heat or ice to the painful area.  Steroid injections to lessen pain, irritation, and inflammation around the nerve.  Reducing activity during periods of pain.  Exercising and stretching to strengthen your abdomen and improve flexibility of your spine. Your caregiver may suggest losing weight if the extra weight makes the back pain worse.  Physical therapy.  Surgery to eliminate what is pressing or pinching the nerve, such as a bone spur or part of a herniated disk. HOME CARE INSTRUCTIONS   Only take over-the-counter or prescription medicines for pain or discomfort as directed by your caregiver.  Apply ice to the affected area for 20 minutes, 3-4 times a day for the first 48-72 hours. Then try heat in the same way.  Exercise, stretch, or perform your usual activities if these do not aggravate your pain.  Attend physical therapy sessions as directed by your caregiver.  Keep all follow-up appointments as directed by your caregiver.  Do not wear high heels or shoes that do not provide proper support.  Check your mattress to see if it is too soft. A firm mattress may lessen your pain and discomfort. SEEK IMMEDIATE MEDICAL CARE IF:   You lose control of your bowel or bladder (incontinence).  You have increasing weakness in the lower back, pelvis, buttocks,   or legs.  You have redness or swelling of your back.  You have a burning sensation when you urinate.  You have pain that gets worse when you lie down or awakens you at night.  Your pain is worse than you have experienced in the past.  Your pain is lasting longer than 4 weeks.  You are suddenly losing weight without reason. MAKE SURE YOU:  Understand these  instructions.  Will watch your condition.  Will get help right away if you are not doing well or get worse. Document Released: 12/18/2000 Document Revised: 06/25/2011 Document Reviewed: 05/05/2011 Exit[employer] Patient Information 2015 [employer]. This information is not intended to replace advice given to you by your health care provider. Make sure you discuss any questions you have with your health care provider.  

## 2014-05-03 NOTE — ED Notes (Signed)
Pt is in stable condition upon d/c and ambulates from ED. 

## 2014-05-03 NOTE — ED Provider Notes (Signed)
CSN: [id number]     Arrival date & time 05/03/14  3893 History   First MD Initiated Contact with Patient 05/03/14 [phone]     Chief Complaint  Patient presents with  . Claudication     (Consider location/radiation/quality/duration/timing/severity/associated sxs/prior Treatment) HPI  Matthew Lewis Is a 59 year old male with a past medical history of degenerative disc disease and left-sided sciatica. He also has a past medical history of type 2 diabetes, and history of opiate dependence. The patient has been sober for the past 3 years and is an active recovery. The patient is followed by Dr. Nelva Bush at [facility] and is scheduled for a spinal epidural injection 05/18/2014. Patient states that he was seen on April 12 and given a prednisone taper as well as Mobic. He has had worsening left-sided sciatica and has had no relief from the medications. He comes in today for complaint of severe pain. Denies weakness, loss of bowel/bladder function or saddle anesthesia. Denies fever or recent procedures to back. The patient is unable to tolerate his pain at this time, but does not want to use any opiates and jeopardize his sobriety.  Past Medical History  Diagnosis Date  . Fistula, anal     multiple  . Abscess of anal and rectal regions     horse shoe abscess  . Poor circulation   . Fatigue   . Sleep deprivation   . Colon cancer 05/2005  . Type II diabetes mellitus   . DM type 2 with diabetic peripheral neuropathy     left foot  . Arthritis     "everywhere; especially in my spine"  . PTSD (post-traumatic stress disorder)     "severe"  . History of substance abuse     last alcohol was 08/2011; last marijuania was 08/2011   Past Surgical History  Procedure Laterality Date  . Hemicolectomy  06/06/2005    left  . Incision and drainage perirectal abscess  03/30/2005  . Examination under anesthesia  03/22/2005    fistula  . Incision and drainage perirectal abscess  12/13/2004  .  Incision and drainage perirectal abscess  07/14/2001  . Anal fistulectomy  12/13/1998  . Incision and drainage perirectal abscess  08/12/2010    horseshoe abscess; Dr Redmond Pulling  . Lumbar disc surgery  1987  . Thoracic discectomy  [id number]  . Anterior fusion cervical spine  2010    "triple"; "did 2 surgeries on the same day in 8768 due to complications"  . Abscess drainage      "probably 15-20 so far at least; rectal"  . Coronary angioplasty  2001  . Detached muscle  2010    right chest; "after cervical fusion complications"  . Elbow surgery  2007    "cut out part of a muscle"; left  . Percutaneous pinning phalanx fracture of hand  ~ 2008    "plates in 2 places"; right  . Examination under anesthesia  06/06/2011    Procedure: EXAM UNDER ANESTHESIA;  Surgeon: Stark Klein, MD;  Location: [facility];  Service: General;  Laterality: N/A;  . Incision and drainage perirectal abscess  06/06/2011    Procedure: IRRIGATION AND DEBRIDEMENT PERIRECTAL ABSCESS;  Surgeon: Stark Klein, MD;  Location: [facility] OR;  Service: General;  Laterality: N/A;   Family History  Problem Relation Age of Onset  . Heart disease Father   . Heart disease Mother   . Cancer Mother   . Heart disease Brother   . Cancer Sister    History  Substance Use Topics  . Smoking status: Former Smoker -- 1.00 packs/day for 10 years    Types: Cigarettes    Quit date: 01/08/1991  . Smokeless tobacco: Never Used  . Alcohol Use: Yes     Comment: 06/05/11 "drank enough when I was younger to last me my whole life; don't remember when I had my last drink, maybe 2012"    Review of Systems   Ten systems reviewed and are negative for acute change, except as noted in the HPI.   Allergies  Other  Home Medications   Prior to Admission medications   Medication Sig Start Date End Date Taking? Authorizing Provider  amLODipine (NORVASC) 10 MG tablet Take 10 mg by mouth daily.    Historical Provider, MD  aspirin EC 81 MG tablet Take 81 mg by mouth  daily.    Historical Provider, MD  atorvastatin (LIPITOR) 40 MG tablet Take 1 tablet (40 mg total) by mouth daily at 6 PM. 12/02/12   Adeline C Viyuoh, MD  gabapentin (NEURONTIN) 400 MG capsule Take 400 mg by mouth 2 (two) times daily.    Historical Provider, MD  glipiZIDE (GLUCOTROL) 5 MG tablet Take 5 mg by mouth 2 (two) times daily before a meal.    Historical Provider, MD  HYDROcodone-acetaminophen (NORCO/VICODIN) 5-325 MG per tablet Take 1-2 tablets by mouth every 6 (six) hours as needed for moderate pain. 11/03/13   Lajean Saver, MD  ibuprofen (ADVIL,MOTRIN) 200 MG tablet Take 200 mg by mouth every 6 (six) hours as needed for moderate pain.    Historical Provider, MD  INVOKANA 300 MG TABS Take 1 tablet by mouth daily. 11/22/12   Historical Provider, MD  lisinopril (PRINIVIL,ZESTRIL) 40 MG tablet Take 40 mg by mouth daily.    Historical Provider, MD  metFORMIN (GLUCOPHAGE) 1000 MG tablet Take 1,000 mg by mouth 2 (two) times daily with a meal.    Historical Provider, MD  methocarbamol (ROBAXIN) 750 MG tablet Take 1 tablet (750 mg total) by mouth every 8 (eight) hours as needed for muscle spasms. 11/03/13   Junius Creamer, NP  Multiple Vitamin (MULTIVITAMIN) tablet Take 1 tablet by mouth daily.    Historical Provider, MD  naphazoline-pheniramine (NAPHCON-A) 0.025-0.3 % ophthalmic solution Place 1 drop into both eyes 4 (four) times daily as needed for irritation.    Historical Provider, MD  predniSONE (DELTASONE) 20 MG tablet 3 Tabs PO Days 1-3, then 2 tabs PO Days 4-6, then 1 tab PO Day 7-9, then Half Tab PO Day 10-12 11/03/13   Junius Creamer, NP   BP 114/64 mmHg  Pulse 93  Temp(Src) 98.3 F (36.8 C) (Oral)  Resp 18  Ht '5\' 10"'$  (1.778 m)  Wt 282 lb (127.914 kg)  BMI 40.46 kg/m2  SpO2 93% Physical Exam  Constitutional: He is oriented to person, place, and time. He appears well-developed and well-nourished. No distress.  HENT:  Head: Normocephalic and atraumatic.  Eyes: Conjunctivae are normal.  No scleral icterus.  Neck: Normal range of motion. Neck supple.  Cardiovascular: Normal rate, regular rhythm and normal heart sounds.   Pulmonary/Chest: Effort normal and breath sounds normal. No respiratory distress.  Abdominal: Soft. There is no tenderness.  Musculoskeletal: He exhibits no edema.  Back exam: antalgic gait, limited range of motion, positive straight-leg raise  left. Strong peripheral pulses.   Neurological: He is oriented to person, place, and time.  Skin: Skin is warm and dry. He is not diaphoretic.  Psychiatric: His behavior is normal.  Nursing note and vitals reviewed.   ED Course  Procedures (including critical care time) Labs Review Labs Reviewed - No data to display  Imaging Review No results found.   EKG Interpretation None      MDM   Final diagnoses:  Sciatica, left    Patient with acute pain exacerbation. No  Neuro deficits. I have secured an appointment for the patient today at 2:30 PM with DR. Ramos's PA. Patient will be discharged with lidoderm patch and robaxin. Patient with back pain.  No neurological deficits and normal neuro exam.  Patient can walk but states is painful.  No loss of bowel or bladder control.  No concern for cauda equina.  No fever, night sweats, weight loss, h/o cancer, IVDU.  RICE protocol and pain medicine indicated and discussed with patient.       Margarita Mail, PA-C 05/03/14 1042  Quintella Reichert, MD 05/03/14 [phone]

## 2014-05-03 NOTE — ED Notes (Signed)
Pt states he is having severe pain with walking and this has been going on since October. Pt states the pain is intolerable now. Pt states he has degenerative disc disease which is the cause of his pain. Pt states he would like a consult with Dr. Nelva Bush in order to get an injection or surgery. Pt states he is a recovering drug addict rt being put on oxycontin after a previous surgery. Pt states "prescription narcotics scare me".

## 2015-08-11 DIAGNOSIS — E1165 Type 2 diabetes mellitus with hyperglycemia: Secondary | ICD-10-CM | POA: Diagnosis not present

## 2015-08-11 DIAGNOSIS — I2510 Atherosclerotic heart disease of native coronary artery without angina pectoris: Secondary | ICD-10-CM | POA: Diagnosis not present

## 2015-08-11 DIAGNOSIS — E1140 Type 2 diabetes mellitus with diabetic neuropathy, unspecified: Secondary | ICD-10-CM | POA: Diagnosis not present

## 2015-08-18 DIAGNOSIS — I2510 Atherosclerotic heart disease of native coronary artery without angina pectoris: Secondary | ICD-10-CM | POA: Diagnosis not present

## 2015-08-18 DIAGNOSIS — E1140 Type 2 diabetes mellitus with diabetic neuropathy, unspecified: Secondary | ICD-10-CM | POA: Diagnosis not present

## 2015-08-18 DIAGNOSIS — E1165 Type 2 diabetes mellitus with hyperglycemia: Secondary | ICD-10-CM | POA: Diagnosis not present

## 2016-01-11 DIAGNOSIS — Z0000 Encounter for general adult medical examination without abnormal findings: Secondary | ICD-10-CM | POA: Diagnosis not present

## 2016-01-11 DIAGNOSIS — I2510 Atherosclerotic heart disease of native coronary artery without angina pectoris: Secondary | ICD-10-CM | POA: Diagnosis not present

## 2016-01-11 DIAGNOSIS — E1165 Type 2 diabetes mellitus with hyperglycemia: Secondary | ICD-10-CM | POA: Diagnosis not present

## 2016-01-11 DIAGNOSIS — Z23 Encounter for immunization: Secondary | ICD-10-CM | POA: Diagnosis not present

## 2016-01-18 DIAGNOSIS — E1165 Type 2 diabetes mellitus with hyperglycemia: Secondary | ICD-10-CM | POA: Diagnosis not present

## 2016-01-18 DIAGNOSIS — I2510 Atherosclerotic heart disease of native coronary artery without angina pectoris: Secondary | ICD-10-CM | POA: Diagnosis not present

## 2016-01-18 DIAGNOSIS — E1140 Type 2 diabetes mellitus with diabetic neuropathy, unspecified: Secondary | ICD-10-CM | POA: Diagnosis not present

## 2016-01-18 DIAGNOSIS — E782 Mixed hyperlipidemia: Secondary | ICD-10-CM | POA: Diagnosis not present

## 2016-05-30 DIAGNOSIS — I2510 Atherosclerotic heart disease of native coronary artery without angina pectoris: Secondary | ICD-10-CM | POA: Diagnosis not present

## 2016-05-30 DIAGNOSIS — E1165 Type 2 diabetes mellitus with hyperglycemia: Secondary | ICD-10-CM | POA: Diagnosis not present

## 2016-06-06 DIAGNOSIS — I2510 Atherosclerotic heart disease of native coronary artery without angina pectoris: Secondary | ICD-10-CM | POA: Diagnosis not present

## 2016-06-06 DIAGNOSIS — E782 Mixed hyperlipidemia: Secondary | ICD-10-CM | POA: Diagnosis not present

## 2016-06-06 DIAGNOSIS — E1165 Type 2 diabetes mellitus with hyperglycemia: Secondary | ICD-10-CM | POA: Diagnosis not present

## 2016-06-12 ENCOUNTER — Emergency Department (HOSPITAL_COMMUNITY): Payer: Medicare Other

## 2016-06-12 ENCOUNTER — Encounter (HOSPITAL_COMMUNITY): Payer: Self-pay

## 2016-06-12 ENCOUNTER — Inpatient Hospital Stay (HOSPITAL_COMMUNITY)
Admission: EM | Admit: 2016-06-12 | Discharge: 2016-06-14 | DRG: 392 | Disposition: A | Discharge status: Home / Self Care | Payer: Medicare Other | Attending: Internal Medicine | Admitting: Internal Medicine

## 2016-06-12 DIAGNOSIS — Z792 Long term (current) use of antibiotics: Secondary | ICD-10-CM | POA: Diagnosis not present

## 2016-06-12 DIAGNOSIS — I10 Essential (primary) hypertension: Secondary | ICD-10-CM | POA: Diagnosis not present

## 2016-06-12 DIAGNOSIS — Z794 Long term (current) use of insulin: Secondary | ICD-10-CM

## 2016-06-12 DIAGNOSIS — R7989 Other specified abnormal findings of blood chemistry: Secondary | ICD-10-CM

## 2016-06-12 DIAGNOSIS — Z7984 Long term (current) use of oral hypoglycemic drugs: Secondary | ICD-10-CM | POA: Diagnosis not present

## 2016-06-12 DIAGNOSIS — E860 Dehydration: Secondary | ICD-10-CM | POA: Diagnosis not present

## 2016-06-12 DIAGNOSIS — Z79899 Other long term (current) drug therapy: Secondary | ICD-10-CM

## 2016-06-12 DIAGNOSIS — R509 Fever, unspecified: Secondary | ICD-10-CM | POA: Diagnosis not present

## 2016-06-12 DIAGNOSIS — Z85038 Personal history of other malignant neoplasm of large intestine: Secondary | ICD-10-CM

## 2016-06-12 DIAGNOSIS — R109 Unspecified abdominal pain: Secondary | ICD-10-CM

## 2016-06-12 DIAGNOSIS — R1013 Epigastric pain: Secondary | ICD-10-CM | POA: Diagnosis not present

## 2016-06-12 DIAGNOSIS — K2980 Duodenitis without bleeding: Secondary | ICD-10-CM | POA: Diagnosis not present

## 2016-06-12 DIAGNOSIS — E119 Type 2 diabetes mellitus without complications: Secondary | ICD-10-CM

## 2016-06-12 DIAGNOSIS — E1142 Type 2 diabetes mellitus with diabetic polyneuropathy: Secondary | ICD-10-CM | POA: Diagnosis present

## 2016-06-12 DIAGNOSIS — D696 Thrombocytopenia, unspecified: Secondary | ICD-10-CM | POA: Diagnosis not present

## 2016-06-12 DIAGNOSIS — A419 Sepsis, unspecified organism: Secondary | ICD-10-CM | POA: Diagnosis not present

## 2016-06-12 DIAGNOSIS — R945 Abnormal results of liver function studies: Secondary | ICD-10-CM

## 2016-06-12 DIAGNOSIS — Z7982 Long term (current) use of aspirin: Secondary | ICD-10-CM | POA: Diagnosis not present

## 2016-06-12 DIAGNOSIS — I2510 Atherosclerotic heart disease of native coronary artery without angina pectoris: Secondary | ICD-10-CM | POA: Diagnosis present

## 2016-06-12 DIAGNOSIS — R42 Dizziness and giddiness: Secondary | ICD-10-CM | POA: Diagnosis not present

## 2016-06-12 DIAGNOSIS — R404 Transient alteration of awareness: Secondary | ICD-10-CM | POA: Diagnosis not present

## 2016-06-12 DIAGNOSIS — R000 Tachycardia, unspecified: Secondary | ICD-10-CM | POA: Diagnosis not present

## 2016-06-12 DIAGNOSIS — F4310 Post-traumatic stress disorder, unspecified: Secondary | ICD-10-CM | POA: Diagnosis present

## 2016-06-12 DIAGNOSIS — E118 Type 2 diabetes mellitus with unspecified complications: Secondary | ICD-10-CM | POA: Diagnosis not present

## 2016-06-12 DIAGNOSIS — R112 Nausea with vomiting, unspecified: Secondary | ICD-10-CM | POA: Diagnosis not present

## 2016-06-12 DIAGNOSIS — E861 Hypovolemia: Secondary | ICD-10-CM | POA: Diagnosis present

## 2016-06-12 DIAGNOSIS — E871 Hypo-osmolality and hyponatremia: Secondary | ICD-10-CM | POA: Diagnosis present

## 2016-06-12 DIAGNOSIS — E785 Hyperlipidemia, unspecified: Secondary | ICD-10-CM | POA: Diagnosis not present

## 2016-06-12 DIAGNOSIS — K529 Noninfective gastroenteritis and colitis, unspecified: Secondary | ICD-10-CM | POA: Diagnosis not present

## 2016-06-12 LAB — COMPREHENSIVE METABOLIC PANEL
ALK PHOS: 63 U/L (ref 38–126)
ALT: 53 U/L (ref 17–63)
ANION GAP: 11 (ref 5–15)
AST: 55 U/L — ABNORMAL HIGH (ref 15–41)
Albumin: 3.3 g/dL — ABNORMAL LOW (ref 3.5–5.0)
BUN: 18 mg/dL (ref 6–20)
CALCIUM: 8.6 mg/dL — AB (ref 8.9–10.3)
CHLORIDE: 98 mmol/L — AB (ref 101–111)
CO2: 19 mmol/L — AB (ref 22–32)
CREATININE: 0.75 mg/dL (ref 0.61–1.24)
GFR calc non Af Amer: >60 mL/min (ref >60)
Glucose, Bld: 151 mg/dL — ABNORMAL HIGH (ref 65–99)
Potassium: 4.3 mmol/L (ref 3.5–5.1)
SODIUM: 128 mmol/L — AB (ref 135–145)
Total Bilirubin: 1 mg/dL (ref 0.3–1.2)
Total Protein: 7 g/dL (ref 6.5–8.1)

## 2016-06-12 LAB — I-STAT TROPONIN, ED: Troponin i, poc: 0 ng/mL (ref 0.00–0.08)

## 2016-06-12 LAB — URINALYSIS, ROUTINE W REFLEX MICROSCOPIC
Bacteria, UA: NONE SEEN
Bilirubin Urine: NEGATIVE
Glucose, UA: >=500 mg/dL — AB
HGB URINE DIPSTICK: NEGATIVE
Ketones, ur: 80 mg/dL — AB
Leukocytes, UA: NEGATIVE
Nitrite: NEGATIVE
PROTEIN: NEGATIVE mg/dL
SPECIFIC GRAVITY, URINE: 1.027 (ref 1.005–1.030)
SQUAMOUS EPITHELIAL / LPF: NONE SEEN
pH: 6 (ref 5.0–8.0)

## 2016-06-12 LAB — CBC WITH DIFFERENTIAL/PLATELET
Basophils Absolute: 0 10*3/uL (ref 0.0–0.1)
Basophils Relative: 0 %
EOS ABS: 0 10*3/uL (ref 0.0–0.7)
EOS PCT: 0 %
HCT: 38.8 % — ABNORMAL LOW (ref 39.0–52.0)
Hemoglobin: 13.7 g/dL (ref 13.0–17.0)
LYMPHS ABS: 0.3 10*3/uL — AB (ref 0.7–4.0)
LYMPHS PCT: 8 %
MCH: 31.8 pg (ref 26.0–34.0)
MCHC: 35.3 g/dL (ref 30.0–36.0)
MCV: 90 fL (ref 78.0–100.0)
MONO ABS: 0.3 10*3/uL (ref 0.1–1.0)
MONOS PCT: 7 %
Neutro Abs: 3.4 10*3/uL (ref 1.7–7.7)
Neutrophils Relative %: 85 %
PLATELETS: 89 10*3/uL — AB (ref 150–400)
RBC: 4.31 MIL/uL (ref 4.22–5.81)
RDW: 12.9 % (ref 11.5–15.5)
WBC: 4.1 10*3/uL (ref 4.0–10.5)

## 2016-06-12 LAB — RAPID URINE DRUG SCREEN, HOSP PERFORMED
AMPHETAMINES: NOT DETECTED
BENZODIAZEPINES: NOT DETECTED
Barbiturates: NOT DETECTED
Cocaine: NOT DETECTED
OPIATES: NOT DETECTED
TETRAHYDROCANNABINOL: NOT DETECTED

## 2016-06-12 LAB — BLOOD GAS, VENOUS
Acid-base deficit: 4.6 mmol/L — ABNORMAL HIGH (ref 0.0–2.0)
Bicarbonate: 19.2 mmol/L — ABNORMAL LOW (ref 20.0–28.0)
O2 SAT: 42 %
PATIENT TEMPERATURE: 98.6
PH VEN: 7.372 (ref 7.250–7.430)
pCO2, Ven: 33.9 mmHg — ABNORMAL LOW (ref 44.0–60.0)

## 2016-06-12 LAB — GLUCOSE, CAPILLARY: Glucose-Capillary: 193 mg/dL — ABNORMAL HIGH (ref 65–99)

## 2016-06-12 LAB — LIPASE, BLOOD: LIPASE: 21 U/L (ref 11–51)

## 2016-06-12 LAB — CBG MONITORING, ED: Glucose-Capillary: 123 mg/dL — ABNORMAL HIGH (ref 65–99)

## 2016-06-12 LAB — INFLUENZA PANEL BY PCR (TYPE A & B)
INFLAPCR: NEGATIVE
INFLBPCR: NEGATIVE

## 2016-06-12 LAB — I-STAT CG4 LACTIC ACID, ED: LACTIC ACID, VENOUS: 1.59 mmol/L (ref 0.5–1.9)

## 2016-06-12 LAB — ETHANOL

## 2016-06-12 MED ORDER — INSULIN ASPART 100 UNIT/ML ~~LOC~~ SOLN
0.0000 [IU] | Freq: Three times a day (TID) | SUBCUTANEOUS | Status: DC
  Administered 2016-06-13: 4 [IU] via SUBCUTANEOUS
  Administered 2016-06-13: 2 [IU] via SUBCUTANEOUS
  Administered 2016-06-14: 5 [IU] via SUBCUTANEOUS
  Administered 2016-06-14: 3 [IU] via SUBCUTANEOUS

## 2016-06-12 MED ORDER — INSULIN ASPART 100 UNIT/ML ~~LOC~~ SOLN: 0.0000 [IU] | Freq: Every day | SUBCUTANEOUS | Status: DC

## 2016-06-12 MED ORDER — SODIUM CHLORIDE 0.9 % IV BOLUS (SEPSIS)
1000.0000 mL | Freq: Once | INTRAVENOUS | Status: AC
  Administered 2016-06-12: 1000 mL via INTRAVENOUS

## 2016-06-12 MED ORDER — HYDROCODONE-ACETAMINOPHEN 5-325 MG PO TABS: 1.0000 | ORAL_TABLET | ORAL | Status: DC | PRN

## 2016-06-12 MED ORDER — INSULIN ASPART 100 UNIT/ML ~~LOC~~ SOLN
3.0000 [IU] | Freq: Three times a day (TID) | SUBCUTANEOUS | Status: DC
  Administered 2016-06-13 – 2016-06-14 (×6): 3 [IU] via SUBCUTANEOUS

## 2016-06-12 MED ORDER — ACETAMINOPHEN 325 MG PO TABS
650.0000 mg | ORAL_TABLET | Freq: Four times a day (QID) | ORAL | Status: DC | PRN
  Administered 2016-06-12 – 2016-06-13 (×2): 650 mg via ORAL
  Filled 2016-06-12 (×2): qty 2

## 2016-06-12 MED ORDER — ACETAMINOPHEN 650 MG RE SUPP: 650.0000 mg | Freq: Four times a day (QID) | RECTAL | Status: DC | PRN

## 2016-06-12 MED ORDER — ONDANSETRON HCL 4 MG/2ML IJ SOLN
4.0000 mg | Freq: Four times a day (QID) | INTRAMUSCULAR | Status: DC | PRN
  Administered 2016-06-13: 4 mg via INTRAVENOUS
  Filled 2016-06-12: qty 2

## 2016-06-12 MED ORDER — DULOXETINE HCL 20 MG PO CPEP
20.0000 mg | ORAL_CAPSULE | Freq: Once | ORAL | Status: AC
  Administered 2016-06-13: 20 mg via ORAL
  Filled 2016-06-12: qty 1

## 2016-06-12 MED ORDER — SODIUM CHLORIDE 0.9% FLUSH
3.0000 mL | Freq: Two times a day (BID) | INTRAVENOUS | Status: DC
  Administered 2016-06-13 – 2016-06-14 (×2): 3 mL via INTRAVENOUS

## 2016-06-12 MED ORDER — SODIUM CHLORIDE 0.9 % IV SOLN
INTRAVENOUS | Status: AC
  Administered 2016-06-12 – 2016-06-13 (×2): via INTRAVENOUS

## 2016-06-12 MED ORDER — ONDANSETRON HCL 4 MG/2ML IJ SOLN: 4.0000 mg | Freq: Four times a day (QID) | INTRAMUSCULAR | Status: DC | PRN

## 2016-06-12 MED ORDER — ACETAMINOPHEN 500 MG PO TABS
500.0000 mg | ORAL_TABLET | Freq: Once | ORAL | Status: AC
  Administered 2016-06-12: 500 mg via ORAL
  Filled 2016-06-12: qty 1

## 2016-06-12 MED ORDER — AMLODIPINE BESYLATE 5 MG PO TABS
10.0000 mg | ORAL_TABLET | Freq: Every day | ORAL | Status: DC
  Administered 2016-06-13 – 2016-06-14 (×2): 10 mg via ORAL
  Filled 2016-06-12 (×2): qty 2

## 2016-06-12 MED ORDER — GABAPENTIN 400 MG PO CAPS
400.0000 mg | ORAL_CAPSULE | Freq: Two times a day (BID) | ORAL | Status: DC
  Administered 2016-06-12 – 2016-06-14 (×4): 400 mg via ORAL
  Filled 2016-06-12 (×4): qty 1

## 2016-06-12 MED ORDER — ASPIRIN EC 81 MG PO TBEC
81.0000 mg | DELAYED_RELEASE_TABLET | Freq: Every day | ORAL | Status: DC
  Administered 2016-06-13 – 2016-06-14 (×2): 81 mg via ORAL
  Filled 2016-06-12 (×2): qty 1

## 2016-06-12 MED ORDER — ATORVASTATIN CALCIUM 40 MG PO TABS
40.0000 mg | ORAL_TABLET | Freq: Every day | ORAL | Status: DC
  Administered 2016-06-13 – 2016-06-14 (×2): 40 mg via ORAL
  Filled 2016-06-12 (×2): qty 1

## 2016-06-12 MED ORDER — ONDANSETRON HCL 4 MG PO TABS: 4.0000 mg | ORAL_TABLET | Freq: Four times a day (QID) | ORAL | Status: DC | PRN

## 2016-06-12 MED ORDER — INSULIN DETEMIR 100 UNIT/ML ~~LOC~~ SOLN
60.0000 [IU] | Freq: Every day | SUBCUTANEOUS | Status: DC
  Administered 2016-06-13 – 2016-06-14 (×2): 60 [IU] via SUBCUTANEOUS
  Filled 2016-06-12 (×2): qty 0.6

## 2016-06-12 MED ORDER — ENOXAPARIN SODIUM 40 MG/0.4ML ~~LOC~~ SOLN: 40.0000 mg | SUBCUTANEOUS | Status: DC

## 2016-06-12 MED ORDER — ADULT MULTIVITAMIN W/MINERALS CH
1.0000 | ORAL_TABLET | Freq: Every day | ORAL | Status: DC
  Filled 2016-06-12: qty 1

## 2016-06-12 MED ORDER — CYCLOBENZAPRINE HCL 10 MG PO TABS
10.0000 mg | ORAL_TABLET | Freq: Three times a day (TID) | ORAL | Status: DC | PRN
  Administered 2016-06-12 – 2016-06-13 (×2): 10 mg via ORAL
  Filled 2016-06-12 (×2): qty 1

## 2016-06-12 NOTE — ED Triage Notes (Signed)
Patient brought in by [facility] from PCP for n/v, weakness x 4 days and unable to keep medications down.  Patient given 500Ml Saline bolus in route, last set EMS vitals 142/83, 116hr 26R, 96% on room air. CBg 191 orthostatic Vitals before bolus given.  Sit 116/80, hr 130, stand 92/40 hr144

## 2016-06-12 NOTE — ED Provider Notes (Signed)
[facility] DEPT Provider Note   CSN: [id number] Arrival date & time: 06/12/16  1239     History   Chief Complaint Chief Complaint  Patient presents with  . Emesis  . Fatigue    HPI Matthew Lewis is a 61 y.o. male.  HPI   Matthew Lewis is a 61 y.o. male, with a history of Colon cancer, substance abuse, DM, CAD, presenting to the ED, sent by his PCP for suspicion of sepsis, with nausea, vomiting, fever, and chills for the last four days. Tmax 102. Has been using ibuprofen and tylenol with poor oral intake. One episode of emesis last 24 hours. Also reports diaphoresis, pallor, and visible tremors/chills yesterday. When asked about pain, patient states, "I just hurt all over."  Sick contacts from someone at work. Usual BG 125-150. Last known colon cancer about 7 years ago. Last colonoscopy was 2015 with no recurrence of patient's colon CA. Patient's spouse states all three of their dogs have been ill with violent vomiting and diarrhea and asks if this could be connected.  Denies cough, shortness of breath, CP, diarrhea, hematemesis, abdominal pain, rashes, or any other complaints.      Past Medical History:  Diagnosis Date  . Abscess of anal and rectal regions    horse shoe abscess  . Arthritis    "everywhere; especially in my spine"  . Colon cancer ([place]) 05/2005  . DM type 2 with diabetic peripheral neuropathy (H[facility])    left foot  . Fatigue   . Fistula, anal    multiple  . History of substance abuse    last alcohol was 08/2011; last marijuania was 08/2011  . Poor circulation   . PTSD (post-traumatic stress disorder)    "severe"  . Sleep deprivation   . Type II diabetes mellitus [facility])     Patient Active Problem List   Diagnosis Date Noted  . Hyponatremia 06/12/2016  . Nausea & vomiting 06/12/2016  . Thrombocytopenia ([place]) 06/12/2016  . PTSD (post-traumatic stress disorder) 12/02/2012  . Hx of substance abuse- clean 15 months, in AA 12/02/2012  . Chest pain  with moderate risk of acute coronary syndrome 11/30/2012  . Obesity 11/30/2012  . Hypertension 11/30/2012  . Hyperlipemia 11/30/2012  . Diabetes mellitus ([place]) 11/30/2012  . CAD (coronary artery disease)- PCI 2001- no evaluation since 11/30/2012    Past Surgical History:  Procedure Laterality Date  . ABSCESS DRAINAGE     "probably 15-20 so far at least; rectal"  . ANAL FISTULECTOMY  12/13/1998  . ANTERIOR FUSION CERVICAL SPINE  2010   "triple"; "did 2 surgeries on the same day in 6301 due to complications"  . CORONARY ANGIOPLASTY  2001  . detached muscle  2010   right chest; "after cervical fusion complications"  . ELBOW SURGERY  2007   "cut out part of a muscle"; left  . EXAMINATION UNDER ANESTHESIA  03/22/2005   fistula  . EXAMINATION UNDER ANESTHESIA  06/06/2011   Procedure: EXAM UNDER ANESTHESIA;  Surgeon: Stark Klein, MD;  Location: [facility];  Service: General;  Laterality: N/A;  . HEMICOLECTOMY  06/06/2005   left  . INCISION AND DRAINAGE PERIRECTAL ABSCESS  03/30/2005  . INCISION AND DRAINAGE PERIRECTAL ABSCESS  12/13/2004  . INCISION AND DRAINAGE PERIRECTAL ABSCESS  07/14/2001  . INCISION AND DRAINAGE PERIRECTAL ABSCESS  08/12/2010   horseshoe abscess; Dr Redmond Pulling  . INCISION AND DRAINAGE PERIRECTAL ABSCESS  06/06/2011   Procedure: IRRIGATION AND DEBRIDEMENT PERIRECTAL ABSCESS;  Surgeon: Stark Klein,  MD;  Location: [facility];  Service: General;  Laterality: N/A;  . LUMBAR [place]  . PERCUTANEOUS PINNING PHALANX FRACTURE OF HAND  ~ 2008   "plates in 2 places"; right  . THORACIC DISCECTOMY  1990's       Home Medications    Prior to Admission medications   Medication Sig Start Date End Date Taking? Authorizing Provider  amLODipine (NORVASC) 10 MG tablet Take 10 mg by mouth daily.    [provider]  aspirin EC 81 MG tablet Take 81 mg by mouth daily.    [provider]  atorvastatin (LIPITOR) 40 MG tablet Take 1 tablet (40 mg total) by mouth daily at 6  PM. 12/02/12   Viyuoh, Adeline C, MD  gabapentin (NEURONTIN) 400 MG capsule Take 400 mg by mouth 2 (two) times daily.    [provider]  glipiZIDE (GLUCOTROL) 5 MG tablet Take 5 mg by mouth 2 (two) times daily before a meal.    [provider]  HYDROcodone-acetaminophen (NORCO/VICODIN) 5-325 MG per tablet Take 1-2 tablets by mouth every 6 (six) hours as needed for moderate pain. 11/03/13   Lajean Saver, MD  ibuprofen (ADVIL,MOTRIN) 200 MG tablet Take 200 mg by mouth every 6 (six) hours as needed for moderate pain.    [provider]  INVOKANA 300 MG TABS Take 1 tablet by mouth daily. 11/22/12   [provider]  lidocaine (LIDODERM) 5 % Place 1 patch onto the skin daily. Remove & Discard patch within 12 hours or as directed by MD 05/03/14   Margarita Mail, PA-C  lisinopril (PRINIVIL,ZESTRIL) 40 MG tablet Take 40 mg by mouth daily.    [provider]  metFORMIN (GLUCOPHAGE) 1000 MG tablet Take 1,000 mg by mouth 2 (two) times daily with a meal.    [provider]  methocarbamol (ROBAXIN) 500 MG tablet Take 2 tablets (1,000 mg total) by mouth 3 (three) times daily. 05/03/14   Margarita Mail, PA-C  Multiple Vitamin (MULTIVITAMIN) tablet Take 1 tablet by mouth daily.    [provider]  naphazoline-pheniramine (NAPHCON-A) 0.025-0.3 % ophthalmic solution Place 1 drop into both eyes 4 (four) times daily as needed for irritation.    [provider]  predniSONE (DELTASONE) 20 MG tablet 3 Tabs PO Days 1-3, then 2 tabs PO Days 4-6, then 1 tab PO Day 7-9, then Half Tab PO Day 10-12 11/03/13   Junius Creamer, NP    Family History Family History  Problem Relation Age of Onset  . Heart disease Father   . Heart disease Mother   . Cancer Mother   . Cancer Sister   . Heart disease Brother     Social History Social History  Substance Use Topics  . Smoking status: Former Smoker    Packs/day: 1.00    Years: 10.00    Types: Cigarettes     Quit date: 01/08/1991  . Smokeless tobacco: Never Used  . Alcohol use Yes     Comment: 06/05/11 "drank enough when I was younger to last me my whole life; don't remember when I had my last drink, maybe 2012"     Allergies   Other   Review of Systems Review of Systems  Constitutional: Positive for appetite change, chills, diaphoresis, fatigue and fever.  Respiratory: Negative for cough and shortness of breath.   Cardiovascular: Negative for chest pain.  Gastrointestinal: Negative for abdominal pain.  Musculoskeletal: Positive for myalgias.  All other systems reviewed and are  negative.    Physical Exam Updated Vital Signs BP 136/78 (BP Location: Left Arm)   Pulse (!) 110   Temp 100.1 F (37.8 C) (Oral)   Resp 20   SpO2 97%   Physical Exam  Constitutional: He appears well-developed and well-nourished.  Non-toxic appearance.  Patient appears like he does not feel well.   HENT:  Head: Normocephalic and atraumatic.  Mouth/Throat: Mucous membranes are dry.  Eyes: Conjunctivae are normal.  Neck: Neck supple.  Cardiovascular: Regular rhythm, normal heart sounds and intact distal pulses.  Tachycardia present.   Pulmonary/Chest: Effort normal and breath sounds normal. No respiratory distress.  Abdominal: Soft. Bowel sounds are increased. There is no tenderness. There is no guarding.  Musculoskeletal: He exhibits no edema.  Lymphadenopathy:    He has no cervical adenopathy.  Neurological: He is alert.  Skin: Skin is warm and dry. He is not diaphoretic.  Psychiatric: He has a normal mood and affect. His behavior is normal.  Nursing note and vitals reviewed.    ED Treatments / Results  Labs (all labs ordered are listed, but only abnormal results are displayed) Labs Reviewed  COMPREHENSIVE METABOLIC PANEL - Abnormal; Notable for the following:       Result Value   Sodium 128 (*)    Chloride 98 (*)    CO2 19 (*)    Glucose, Bld 151 (*)    Calcium 8.6 (*)    Albumin 3.3  (*)    AST 55 (*)    All other components within normal limits  URINALYSIS, ROUTINE W REFLEX MICROSCOPIC - Abnormal; Notable for the following:    Glucose, UA >=500 (*)    Ketones, ur 80 (*)    All other components within normal limits  CBC WITH DIFFERENTIAL/PLATELET - Abnormal; Notable for the following:    HCT 38.8 (*)    Platelets 89 (*)    Lymphs Abs 0.3 (*)    All other components within normal limits  BLOOD GAS, VENOUS - Abnormal; Notable for the following:    pCO2, Ven 33.9 (*)    Bicarbonate 19.2 (*)    Acid-base deficit 4.6 (*)    All other components within normal limits  CBG MONITORING, ED - Abnormal; Notable for the following:    Glucose-Capillary 123 (*)    All other components within normal limits  LIPASE, BLOOD  RAPID URINE DRUG SCREEN, HOSP PERFORMED  ETHANOL  INFLUENZA PANEL BY PCR (TYPE A & B)  I-STAT CG4 LACTIC ACID, ED  Randolm Idol, ED    EKG  EKG Interpretation None       Radiology Dg Chest 2 View  Result Date: 06/12/2016 CLINICAL DATA:  Nausea and vomiting.  Weakness. EXAM: CHEST  2 VIEW COMPARISON:  Chest x-ray 11/30/2016 . FINDINGS: Mediastinum and hilar structures normal. Heart size normal. No focal infiltrate. No pleural effusion or pneumothorax. Cervicothoracic spine fusion. IMPRESSION: No acute cardiopulmonary disease. Electronically Signed   By: Marcello Moores  Register   On: 06/12/2016 17:04    Procedures Procedures (including critical care time)  Medications Ordered in ED Medications  sodium chloride 0.9 % bolus 1,000 mL (0 mLs Intravenous Stopped 06/12/16 1613)  sodium chloride 0.9 % bolus 1,000 mL (0 mLs Intravenous Stopped 06/12/16 1812)  acetaminophen (TYLENOL) tablet 500 mg (500 mg Oral Given 06/12/16 1812)     Initial Impression / Assessment and Plan / ED Course  I have reviewed the triage vital signs and the nursing notes.  Pertinent labs & imaging results  that were available during my care of the patient were reviewed by me and  considered in my medical decision making (see chart for details).  Clinical Course as of Jun 12 1813  Wed Jun 12, 2016  1520 Patient states he feels a little better after the liter of NS. States he feels like his appetite is coming back. Denies SOB, CP, or any other additional complaints.  [SJ]  3664 Patient continues to complain of fatigue and generalized weakness.  [SJ]  [id number] Spoke with Dr. Myna Hidalgo, hospitalist, who agreed to admit the patient.  [SJ]    Clinical Course User Index [SJ] Delainey Winstanley C, PA-C    Patient presents with nausea, vomiting, body aches, and generalized weakness. Tachycardia to the 130s and hypotension to 92/40 prior to arrival. No leukocytosis or true fever here in the ED. Patient showed some improvement during his ED course, however, will need admission for persistent tachycardia, fatigue, and weakness. Patient was noted to have had Hyponatremia in the past. I am more suspicious that his hyponatremia today is more due to his vomiting and poor oral intake rather than the cause of it.    Findings and plan of care discussed with Antony Blackbird, MD. Dr. Sherry Ruffing personally evaluated and examined this patient.  Vitals:   06/12/16 1615 06/12/16 1715 06/12/16 1730 06/12/16 1757  BP: 121/74  134/77 134/77  Pulse: (!) 106 (!) 108  (!) 106  Resp: (!) 29 (!) 27 (!) 24 14  Temp:      TempSrc:      SpO2: 98% 95%  96%     Final Clinical Impressions(s) / ED Diagnoses   Final diagnoses:  Non-intractable vomiting with nausea, unspecified vomiting type  Hyponatremia    New Prescriptions New Prescriptions   No medications on file     Layla Maw 06/12/16 1815    Tegeler, Gwenyth Allegra, MD 06/12/16 [phone]

## 2016-06-12 NOTE — H&P (Signed)
History and Physical    Matthew Lewis DOB: 01-Jun-1955 DOA: 06/12/2016  PCP: [name], MD   Patient coming from: Home, by way of PCP office  Chief Complaint: Nausea, vomiting, fatigue, fever/chills  HPI: Matthew Lewis is a 61 y.o. male with medical history significant for insulin-dependent diabetes mellitus, hypertension,coronary artery disease, and history of substance abuse in remission, now presenting to the emergency department with 4 days of nausea, nonbloody nonbilious vomiting, fatigue, and fevers with chills. Patient reports that he been in his usual state of health until the evening of 06/08/2016 when he developed the insidious onset of nausea with vomiting. This persisted throughout the night and he had become febrile the next day. Since that time, the patient has had persistent fevers with chills, nausea, and vomiting with any attempts at oral intake. He has been taking acetaminophen and ibuprofen for his symptoms with some momentary relief, but he vomits the tablets backup about half the time. He has never experienced these symptoms previously. He reports a sick contact at work who was experiencing the same symptoms, but also diarrhea last week. There has not been any recent long distance travel. Patient denies any chest pain or palpitations, denies dyspnea or cough, and denies rash or wound. There has not been any abdominal pain or diarrhea. He continues to pass flatus.  ED Course: Upon arrival to the ED, patient is found to have a temp of 37.8C, saturating well on room air, mildly tachypneic, tachycardic in the 110s, and with stable blood pressure. Chest x-ray is negative for acute cardiopulmonary disease and urinalysis features glucosuria and ketonuria. Chemistry panels notable for sodium of 128, bicarbonate 19, and serum glucose 151. CBC is notable for a new thrombocytopenia with platelets 89,000. Lactic acid is reassuring at 1.59, troponin is undetectable,  UDS is negative, and lipase is normal. Patient was treated with Tylenol and 1 L of normal saline in the ED. He continued to be tachycardic and listless. He will be observed on telemetry unit for ongoing evaluation and management of fevers, chills, nausea, vomiting, and hyponatremia, suspected secondary to acute viral gastroenteritis with dehydration.  Review of Systems:  All other systems reviewed and apart from HPI, are negative.  Past Medical History:  Diagnosis Date  . Abscess of anal and rectal regions    horse shoe abscess  . Arthritis    "everywhere; especially in my spine"  . Colon cancer ([place]) 05/2005  . DM type 2 with diabetic peripheral neuropathy (HCC)    left foot  . Fatigue   . Fistula, anal    multiple  . History of substance abuse    last alcohol was 08/2011; last marijuania was 08/2011  . Poor circulation   . PTSD (post-traumatic stress disorder)    "severe"  . Sleep deprivation   . Type II diabetes mellitus ([place])     Past Surgical History:  Procedure Laterality Date  . ABSCESS DRAINAGE     "probably 15-20 so far at least; rectal"  . ANAL FISTULECTOMY  12/13/1998  . ANTERIOR FUSION CERVICAL SPINE  2010   "triple"; "did 2 surgeries on the same day in 2993 due to complications"  . CORONARY ANGIOPLASTY  2001  . detached muscle  2010   right chest; "after cervical fusion complications"  . ELBOW SURGERY  2007   "cut out part of a muscle"; left  . EXAMINATION UNDER ANESTHESIA  03/22/2005   fistula  . EXAMINATION UNDER ANESTHESIA  06/06/2011   Procedure:  EXAM UNDER ANESTHESIA;  Surgeon: Stark Klein, MD;  Location: [facility];  Service: General;  Laterality: N/A;  . HEMICOLECTOMY  06/06/2005   left  . INCISION AND DRAINAGE PERIRECTAL ABSCESS  03/30/2005  . INCISION AND DRAINAGE PERIRECTAL ABSCESS  12/13/2004  . INCISION AND DRAINAGE PERIRECTAL ABSCESS  07/14/2001  . INCISION AND DRAINAGE PERIRECTAL ABSCESS  08/12/2010   horseshoe abscess; Dr Redmond Pulling  . INCISION AND DRAINAGE  PERIRECTAL ABSCESS  06/06/2011   Procedure: IRRIGATION AND DEBRIDEMENT PERIRECTAL ABSCESS;  Surgeon: Stark Klein, MD;  Location: [facility];  Service: General;  Laterality: N/A;  . LUMBAR [facility]  . PERCUTANEOUS PINNING PHALANX FRACTURE OF HAND  ~ 2008   "plates in 2 places"; right  . THORACIC DISCECTOMY  1990's     reports that he quit smoking about 25 years ago. His smoking use included Cigarettes. He has a 10.00 pack-year smoking history. He has never used smokeless tobacco. He reports that he drinks alcohol. He reports that he uses drugs, including Oxycodone and Marijuana.  Allergies  Allergen Reactions  . Other Other (See Comments)    Patient is recovering from drug addiction does not want any narcotics    Family History  Problem Relation Age of Onset  . Heart disease Father   . Heart disease Mother   . Cancer Mother   . Cancer Sister   . Heart disease Brother      Prior to Admission medications   Medication Sig Start Date End Date Taking? Authorizing Provider  acetaminophen (TYLENOL) 325 MG tablet Take 325 mg by mouth every 6 (six) hours as needed for moderate pain.   Yes [provider]  amLODipine (NORVASC) 10 MG tablet Take 10 mg by mouth daily.   Yes [provider]  aspirin EC 81 MG tablet Take 81 mg by mouth daily.   Yes [provider]  atorvastatin (LIPITOR) 40 MG tablet Take 1 tablet (40 mg total) by mouth daily at 6 PM. 12/02/12  Yes Viyuoh, Adeline C, MD  cyclobenzaprine (FLEXERIL) 10 MG tablet Take 10 mg by mouth 3 (three) times daily as needed for muscle spasms.   Yes [provider]  empagliflozin (JARDIANCE) 10 MG TABS tablet Take 10 mg by mouth daily.   Yes [provider]  gabapentin (NEURONTIN) 400 MG capsule Take 400 mg by mouth 2 (two) times daily.   Yes [provider]  glipiZIDE (GLUCOTROL) 5 MG tablet Take 5 mg by mouth 2 (two) times daily before a meal.   Yes [provider]    ibuprofen (ADVIL,MOTRIN) 200 MG tablet Take 200 mg by mouth every 6 (six) hours as needed for moderate pain.   Yes [provider]  Insulin Detemir (LEVEMIR FLEXPEN) 100 UNIT/ML Pen Inject 10-60 Units into the skin 2 (two) times daily. Take 60 units in the am and Take 10 units in the pm   Yes [provider]  INVOKANA 300 MG TABS Take 1 tablet by mouth daily. 11/22/12  Yes [provider]  lisinopril (PRINIVIL,ZESTRIL) 40 MG tablet Take 40 mg by mouth daily.   Yes [provider]  metFORMIN (GLUCOPHAGE) 1000 MG tablet Take 1,000 mg by mouth 2 (two) times daily with a meal.   Yes [provider]  Multiple Vitamin (MULTIVITAMIN) tablet Take 1 tablet by mouth daily.   Yes [provider]    Physical Exam: Vitals:   06/12/16 1757 06/12/16 1815 06/12/16 1855 06/12/16 1900  BP: 134/77 136/76  136/76 126/75  Pulse: (!) 106  (!) 106 (!) 103  Resp: 14 16 17 15   Temp:      TempSrc:      SpO2: 96%  99% 100%      Constitutional: NAD, calm, listless, obese. Eyes: PERTLA, lids and conjunctivae normal ENMT: Mucous membranes are dry. Posterior pharynx clear of any exudate or lesions.   Neck: normal, supple, no masses, no thyromegaly Respiratory: clear to auscultation bilaterally, no wheezing, no crackles. Normal respiratory effort.  Cardiovascular: Rate ~110 and regular. No extremity edema. No significant JVD. Abdomen: No distension, no tenderness, soft, no masses palpated. Bowel sounds active.  Musculoskeletal: no clubbing / cyanosis. No joint deformity upper and lower extremities. Normal muscle tone.  Skin: no significant rashes, lesions, ulcers. Warm, dry, well-perfused. Poor turgor.  Neurologic: CN 2-12 grossly intact. Sensation intact, DTR normal. Strength 5/5 in all 4 limbs.  Psychiatric: Alert and oriented x 3. Pleasant and cooperative.     Labs on Admission: I have personally reviewed following labs and imaging  studies  CBC:  Recent Labs Lab 06/12/16 1424  WBC 4.1  NEUTROABS 3.4  HGB 13.7  HCT 38.8*  MCV 90.0  PLT 89*   Basic Metabolic Panel:  Recent Labs Lab 06/12/16 1424  NA 128*  K 4.3  CL 98*  CO2 19*  GLUCOSE 151*  BUN 18  CREATININE 0.75  CALCIUM 8.6*   GFR: CrCl cannot be calculated (Unknown ideal weight.). Liver Function Tests:  Recent Labs Lab 06/12/16 1424  AST 55*  ALT 53  ALKPHOS 63  BILITOT 1.0  PROT 7.0  ALBUMIN 3.3*    Recent Labs Lab 06/12/16 1424  LIPASE 21   No results for input(s): AMMONIA in the last 168 hours. Coagulation Profile: No results for input(s): INR, PROTIME in the last 168 hours. Cardiac Enzymes: No results for input(s): CKTOTAL, CKMB, CKMBINDEX, TROPONINI in the last 168 hours. BNP (last 3 results) No results for input(s): PROBNP in the last 8760 hours. HbA1C: No results for input(s): HGBA1C in the last 72 hours. CBG:  Recent Labs Lab 06/12/16 1503  GLUCAP 123*   Lipid Profile: No results for input(s): CHOL, HDL, LDLCALC, TRIG, CHOLHDL, LDLDIRECT in the last 72 hours. Thyroid Function Tests: No results for input(s): TSH, T4TOTAL, FREET4, T3FREE, THYROIDAB in the last 72 hours. Anemia Panel: No results for input(s): VITAMINB12, FOLATE, FERRITIN, TIBC, IRON, RETICCTPCT in the last 72 hours. Urine analysis:    Component Value Date/Time   COLORURINE YELLOW 06/12/2016 1427   APPEARANCEUR CLEAR 06/12/2016 1427   LABSPEC 1.027 06/12/2016 1427   PHURINE 6.0 06/12/2016 1427   GLUCOSEU >=500 (A) 06/12/2016 1427   HGBUR NEGATIVE 06/12/2016 1427   BILIRUBINUR NEGATIVE 06/12/2016 1427   KETONESUR 80 (A) 06/12/2016 1427   PROTEINUR NEGATIVE 06/12/2016 1427   UROBILINOGEN 0.2 08/15/2011 1816   NITRITE NEGATIVE 06/12/2016 1427   LEUKOCYTESUR NEGATIVE 06/12/2016 1427   Sepsis Labs: @LABRCNTIP (procalcitonin:4,lacticidven:4) )No results found for this or any previous visit (from the past 240 hour(s)).   Radiological  Exams on Admission: Dg Chest 2 View  Result Date: 06/12/2016 CLINICAL DATA:  Nausea and vomiting.  Weakness. EXAM: CHEST  2 VIEW COMPARISON:  Chest x-ray 11/30/2016 . FINDINGS: Mediastinum and hilar structures normal. Heart size normal. No focal infiltrate. No pleural effusion or pneumothorax. Cervicothoracic spine fusion. IMPRESSION: No acute cardiopulmonary disease. Electronically Signed   By: Marcello Moores  Register   On: 06/12/2016 17:04    EKG: Ordered and pending.  Assessment/Plan  1. Nausea/vomiting with dehydration - Pt presents with 4 days fever, chills, nausea, and vomiting; no abd pain or diarrhea  - He has a coworker with similar sxs and this is suspected to reflect an acute viral gastroenteritis  - He was treated with 500 cc NS en route to hospital, and 1 liter NS in ED, but appears hypovolemic on admission  - Plan to continue supportive care with IVF hydration, antiemetics, antipyretics  - Maintain enteric precautions; if diarrhea ensues, will send GI pathogen panel   2. Hyponatremia - Serum sodium is 128 on admission - Sodium is chronically low, but typically low-130's  - Pt is hypovolemic, will continue fluid-resuscitation with NS and check a chem panel overnight and in am to avoid an over-correction  3. Thrombocytopenia   - Platelet count 89k on admission without bleeding or rash  - Likely secondary to infectious process  - Avoid pharmacologic VTE ppx for now and repeat CBC in am    4. Insulin-dependent DM  - A1c was 6.1% in 2014  - managed at home with Invokana, glipizide, metformin, and Levemir 60 units qAM and 10 units qHS  - Plan to check CBG with meals and qHS - Continue Levemir and add a moderate-intensity SSI    5. Hypertension - BP is at goal  - Plan to hold lisinopril initially given his marked dehydration; continue Norvasc   6. CAD - No anginal complaints - Troponin undetectable  - Conitnue ASA 81 and Lipitor     DVT prophylaxis: SCD's  Code Status:  Full   Family Communication: Discussed with patient  Disposition Plan: Observe on telemetry Consults called: None Admission status: Observation    Vianne Bulls, MD [facility] Pager [phone]  If 7PM-7AM, please contact night-coverage [email or web address] Password [email or web address]  06/12/2016, 8:21 PM

## 2016-06-13 ENCOUNTER — Observation Stay (HOSPITAL_COMMUNITY): Payer: Medicare Other

## 2016-06-13 DIAGNOSIS — E785 Hyperlipidemia, unspecified: Secondary | ICD-10-CM

## 2016-06-13 DIAGNOSIS — K2980 Duodenitis without bleeding: Principal | ICD-10-CM

## 2016-06-13 DIAGNOSIS — R112 Nausea with vomiting, unspecified: Secondary | ICD-10-CM | POA: Diagnosis not present

## 2016-06-13 DIAGNOSIS — F4310 Post-traumatic stress disorder, unspecified: Secondary | ICD-10-CM | POA: Diagnosis present

## 2016-06-13 DIAGNOSIS — Z7982 Long term (current) use of aspirin: Secondary | ICD-10-CM | POA: Diagnosis not present

## 2016-06-13 DIAGNOSIS — E118 Type 2 diabetes mellitus with unspecified complications: Secondary | ICD-10-CM

## 2016-06-13 DIAGNOSIS — I10 Essential (primary) hypertension: Secondary | ICD-10-CM | POA: Diagnosis present

## 2016-06-13 DIAGNOSIS — Z794 Long term (current) use of insulin: Secondary | ICD-10-CM

## 2016-06-13 DIAGNOSIS — D696 Thrombocytopenia, unspecified: Secondary | ICD-10-CM | POA: Diagnosis present

## 2016-06-13 DIAGNOSIS — E871 Hypo-osmolality and hyponatremia: Secondary | ICD-10-CM

## 2016-06-13 DIAGNOSIS — K529 Noninfective gastroenteritis and colitis, unspecified: Secondary | ICD-10-CM | POA: Diagnosis not present

## 2016-06-13 DIAGNOSIS — R1013 Epigastric pain: Secondary | ICD-10-CM | POA: Diagnosis not present

## 2016-06-13 DIAGNOSIS — R1110 Vomiting, unspecified: Secondary | ICD-10-CM | POA: Diagnosis not present

## 2016-06-13 DIAGNOSIS — Z85038 Personal history of other malignant neoplasm of large intestine: Secondary | ICD-10-CM | POA: Diagnosis not present

## 2016-06-13 DIAGNOSIS — Z792 Long term (current) use of antibiotics: Secondary | ICD-10-CM | POA: Diagnosis not present

## 2016-06-13 DIAGNOSIS — Z7984 Long term (current) use of oral hypoglycemic drugs: Secondary | ICD-10-CM | POA: Diagnosis not present

## 2016-06-13 DIAGNOSIS — E1142 Type 2 diabetes mellitus with diabetic polyneuropathy: Secondary | ICD-10-CM | POA: Diagnosis present

## 2016-06-13 DIAGNOSIS — E861 Hypovolemia: Secondary | ICD-10-CM | POA: Diagnosis present

## 2016-06-13 DIAGNOSIS — R7989 Other specified abnormal findings of blood chemistry: Secondary | ICD-10-CM

## 2016-06-13 DIAGNOSIS — R945 Abnormal results of liver function studies: Secondary | ICD-10-CM

## 2016-06-13 DIAGNOSIS — E860 Dehydration: Secondary | ICD-10-CM | POA: Diagnosis present

## 2016-06-13 DIAGNOSIS — I2510 Atherosclerotic heart disease of native coronary artery without angina pectoris: Secondary | ICD-10-CM | POA: Diagnosis present

## 2016-06-13 DIAGNOSIS — Z79899 Other long term (current) drug therapy: Secondary | ICD-10-CM | POA: Diagnosis not present

## 2016-06-13 LAB — CBC WITH DIFFERENTIAL/PLATELET
Basophils Absolute: 0 10*3/uL (ref 0.0–0.1)
Basophils Relative: 0 %
EOS ABS: 0 10*3/uL (ref 0.0–0.7)
Eosinophils Relative: 0 %
HEMATOCRIT: 37.4 % — AB (ref 39.0–52.0)
HEMOGLOBIN: 12.7 g/dL — AB (ref 13.0–17.0)
LYMPHS ABS: 0.5 10*3/uL — AB (ref 0.7–4.0)
Lymphocytes Relative: 16 %
MCH: 30.7 pg (ref 26.0–34.0)
MCHC: 34 g/dL (ref 30.0–36.0)
MCV: 90.3 fL (ref 78.0–100.0)
MONO ABS: 0.4 10*3/uL (ref 0.1–1.0)
MONOS PCT: 13 %
NEUTROS ABS: 2.3 10*3/uL (ref 1.7–7.7)
NEUTROS PCT: 71 %
Platelets: 89 10*3/uL — ABNORMAL LOW (ref 150–400)
RBC: 4.14 MIL/uL — ABNORMAL LOW (ref 4.22–5.81)
RDW: 13.1 % (ref 11.5–15.5)
WBC: 3.3 10*3/uL — ABNORMAL LOW (ref 4.0–10.5)

## 2016-06-13 LAB — COMPREHENSIVE METABOLIC PANEL
ALBUMIN: 3.1 g/dL — AB (ref 3.5–5.0)
ALK PHOS: 80 U/L (ref 38–126)
ALT: 57 U/L (ref 17–63)
AST: 61 U/L — AB (ref 15–41)
Anion gap: 8 (ref 5–15)
BUN: 12 mg/dL (ref 6–20)
CALCIUM: 8.2 mg/dL — AB (ref 8.9–10.3)
CHLORIDE: 98 mmol/L — AB (ref 101–111)
CO2: 23 mmol/L (ref 22–32)
CREATININE: 0.65 mg/dL (ref 0.61–1.24)
GFR calc Af Amer: >60 mL/min (ref >60)
GFR calc non Af Amer: >60 mL/min (ref >60)
GLUCOSE: 118 mg/dL — AB (ref 65–99)
Potassium: 3.6 mmol/L (ref 3.5–5.1)
SODIUM: 129 mmol/L — AB (ref 135–145)
Total Bilirubin: 1 mg/dL (ref 0.3–1.2)
Total Protein: 6.4 g/dL — ABNORMAL LOW (ref 6.5–8.1)

## 2016-06-13 LAB — BASIC METABOLIC PANEL
Anion gap: 10 (ref 5–15)
Anion gap: 7 (ref 5–15)
BUN: 13 mg/dL (ref 6–20)
BUN: 15 mg/dL (ref 6–20)
CHLORIDE: 101 mmol/L (ref 101–111)
CO2: 20 mmol/L — AB (ref 22–32)
CO2: 21 mmol/L — AB (ref 22–32)
CREATININE: 0.65 mg/dL (ref 0.61–1.24)
CREATININE: 0.74 mg/dL (ref 0.61–1.24)
Calcium: 8.1 mg/dL — ABNORMAL LOW (ref 8.9–10.3)
Calcium: 8.1 mg/dL — ABNORMAL LOW (ref 8.9–10.3)
Chloride: 101 mmol/L (ref 101–111)
GFR calc non Af Amer: >60 mL/min (ref >60)
GFR calc non Af Amer: >60 mL/min (ref >60)
GLUCOSE: 148 mg/dL — AB (ref 65–99)
GLUCOSE: 178 mg/dL — AB (ref 65–99)
Potassium: 4.3 mmol/L (ref 3.5–5.1)
Potassium: 4.8 mmol/L (ref 3.5–5.1)
Sodium: 129 mmol/L — ABNORMAL LOW (ref 135–145)
Sodium: 131 mmol/L — ABNORMAL LOW (ref 135–145)

## 2016-06-13 LAB — HIV ANTIBODY (ROUTINE TESTING W REFLEX): HIV SCREEN 4TH GENERATION: NONREACTIVE

## 2016-06-13 LAB — GLUCOSE, CAPILLARY
GLUCOSE-CAPILLARY: 139 mg/dL — AB (ref 65–99)
Glucose-Capillary: 146 mg/dL — ABNORMAL HIGH (ref 65–99)
Glucose-Capillary: 120 mg/dL — ABNORMAL HIGH (ref 65–99)
Glucose-Capillary: 126 mg/dL — ABNORMAL HIGH (ref 65–99)

## 2016-06-13 LAB — OSMOLALITY, URINE: Osmolality, Ur: 725 mOsm/kg (ref 300–900)

## 2016-06-13 LAB — SODIUM, URINE, RANDOM: SODIUM UR: 29 mmol/L

## 2016-06-13 MED ORDER — FAMOTIDINE 20 MG PO TABS
20.0000 mg | ORAL_TABLET | Freq: Two times a day (BID) | ORAL | Status: DC
  Administered 2016-06-13 – 2016-06-14 (×2): 20 mg via ORAL
  Filled 2016-06-13 (×2): qty 1

## 2016-06-13 MED ORDER — IOPAMIDOL (ISOVUE-300) INJECTION 61%
100.0000 mL | Freq: Once | INTRAVENOUS | Status: AC | PRN
  Administered 2016-06-13: 100 mL via INTRAVENOUS

## 2016-06-13 MED ORDER — METRONIDAZOLE IN NACL 5-0.79 MG/ML-% IV SOLN
500.0000 mg | Freq: Three times a day (TID) | INTRAVENOUS | Status: DC
  Administered 2016-06-13 – 2016-06-14 (×2): 500 mg via INTRAVENOUS
  Filled 2016-06-13 (×3): qty 100

## 2016-06-13 MED ORDER — ADULT MULTIVITAMIN W/MINERALS CH
1.0000 | ORAL_TABLET | Freq: Every day | ORAL | Status: DC
  Administered 2016-06-13 – 2016-06-14 (×2): 1 via ORAL
  Filled 2016-06-13 (×2): qty 1

## 2016-06-13 MED ORDER — PANTOPRAZOLE SODIUM 40 MG PO TBEC
40.0000 mg | DELAYED_RELEASE_TABLET | Freq: Two times a day (BID) | ORAL | Status: DC
  Administered 2016-06-13 – 2016-06-14 (×2): 40 mg via ORAL
  Filled 2016-06-13 (×2): qty 1

## 2016-06-13 MED ORDER — IOPAMIDOL (ISOVUE-300) INJECTION 61%
30.0000 mL | Freq: Once | INTRAVENOUS | Status: AC | PRN
  Administered 2016-06-13: 30 mL via ORAL

## 2016-06-13 MED ORDER — CIPROFLOXACIN IN D5W 400 MG/200ML IV SOLN
400.0000 mg | Freq: Two times a day (BID) | INTRAVENOUS | Status: DC
  Administered 2016-06-13 – 2016-06-14 (×2): 400 mg via INTRAVENOUS
  Filled 2016-06-13 (×2): qty 200

## 2016-06-13 MED ORDER — SODIUM CHLORIDE 0.9 % IV SOLN
INTRAVENOUS | Status: DC
  Administered 2016-06-13 – 2016-06-14 (×2): via INTRAVENOUS

## 2016-06-13 MED ORDER — IOPAMIDOL (ISOVUE-300) INJECTION 61%
INTRAVENOUS | Status: AC
  Filled 2016-06-13: qty 100

## 2016-06-13 MED ORDER — IOPAMIDOL (ISOVUE-300) INJECTION 61%
INTRAVENOUS | Status: AC
  Filled 2016-06-13: qty 30

## 2016-06-13 NOTE — Progress Notes (Signed)
Nutrition Brief Note  Patient identified on the Malnutrition Screening Tool (MST) Report  Wt Readings from Last 15 Encounters:  06/13/16 245 lb 6 oz (111.3 kg)  05/03/14 282 lb (127.9 kg)  11/03/13 260 lb (117.9 kg)  01/12/13 257 lb (116.6 kg)  12/22/12 257 lb 6.4 oz (116.8 kg)  11/30/12 254 lb 10.1 oz (115.5 kg)  06/05/11 246 lb 3.2 oz (111.7 kg)  08/30/10 228 lb 8 oz (103.6 kg)    Body mass index is 33.28 kg/m. Patient meets criteria for obesity based on current BMI. Skin WDL Pt admitted following 4 days of N/V, fatigue, fever, and chills. Current diet order is Regular and pt ate 100% of breakfast and lunch today. Labs and medications reviewed.   No nutrition interventions warranted at this time. If nutrition issues arise, please consult RD.    Jarome Matin, MS, RD, LDN, [facility] In[facility] Clinical Dietitian Pager # [phone] After hours/weekend pager # [phone]

## 2016-06-13 NOTE — Progress Notes (Signed)
PROGRESS NOTE    Matthew Lewis  OXB:[id number] DOB: Sep 04, 1955 DOA: 06/12/2016 PCP: [name], MD   Brief Narrative:  Matthew Lewis is a 61 y.o. male with medical history significant for insulin-dependent diabetes mellitus, hypertension,coronary artery disease, and history of substance abuse in remission, now presenting to the emergency department with 4 days of nausea, nonbloody nonbilious vomiting, fatigue, and fevers with chills. Patient reports that he been in his usual state of health until the evening of 06/08/2016 when he developed the insidious onset of nausea with vomiting. This persisted throughout the night and he had become febrile the next day. Since that time, the patient has had persistent fevers with chills, nausea, and vomiting with any attempts at oral intake. He has been taking acetaminophen and ibuprofen for his symptoms with some momentary relief, but he vomits the tablets backup about half the time. He has never experienced these symptoms previously. He reports a sick contact at work who was experiencing the same symptoms, but also diarrhea last week. There has not been any recent long distance travel. He was worked up and admitted for N/V Fever and underwent CT Abdomen and Pelvis which showed marked Duodenitis.   Assessment & Plan:   Principal Problem:   Duodenitis Active Problems:   Hypertension   Hyperlipemia   Diabetes mellitus ([place])   CAD (coronary artery disease)- PCI 2001- no evaluation since   PTSD (post-traumatic stress disorder)   Hyponatremia   Nausea & vomiting   Thrombocytopenia ([facility])   Acute gastroenteritis   Abnormal LFTs  Nausea/vomiting with dehydration in the setting of Severe Duodenitis/Duodenal Abnormality   - Pt presents with 4 days fever, chills, nausea, and vomiting; no abd pain or diarrhea  - He has a coworker with similar sxs and this is suspected to reflect an acute viral gastroenteritis  - He was treated with 500 cc NS en  route to hospital, and 1 liter NS in ED, but appears hypovolemic on admission  - Plan to continue supportive care with IVF hydration with NS at 100 mL/hr, - C/w antiemetics, antipyretics  - CT of the Abd/Pelvis showed Marked abnormality of the duodenum with significant wall thickening (14 mm) and inflammatory changes. There is retroperitoneal fluid but no evidence for extraluminal gas. Considerations include inflammatory or infectious duodenitis or peptic ulcer disease. Malignancy is much less likely. - Obtain H Pylori Test - Will start patient on Pantoprazole 40 mg po BID and with Famotidine 20 mg po BID - Maintain enteric precautions; if diarrhea ensues, will send GI pathogen panel  - Will start Empir[employer] Abx Coverage with IV Cipro/Flagyl  - Will discuss with GI in AM   Hyponatremia - Serum sodium is 128 on admission; Improved to 131 and then dropped to 129 - Sodium is chronically low, but typically low-130's  - Pt is hypovolemic, will continue fluid-resuscitation with NS with NS at 100 mL/hr  - Repeat CMP in AM  Thrombocytopenia   - Platelet count 89k on admission without bleeding or rash  - Likely secondary to infectious process  - Avoid pharmacologic VTE ppx for now  - Repeat CBC in AM   Insulin-dependent DM  - A1c was 6.1% in 2014  - Managed at home with Invokana, glipizide, metformin, and Levemir 60 units qAM and 10 units qHS  - Plan to check CBG with meals and qHS - Continue Levemir 60 units sq daily and added a Moderate-intensity SSI  AC/HS - C/w Gabapentin 400 mg po BID  -  C/w 3 units of Novolog sq TIDwm - CBG's ranging from 120-146  Hypertension - BP is at goal  - Plan to hold Lisinopril initially given his marked dehydration Continue Amlodipine 10 mg po Daily  CAD - No Anginal complaints - CT Abd/Pelvis showed coronary artery calcification vs,. Stent and Aortic atherosclerosis  - Troponin undetectable  - Conitnue ASA 81 and Atorvastatin 40 mg po Daily   Abnormal  LFT's -AST was elevated at 61 -CT Abd/Pelvis showed Hepatic Changes with Cirrhosis -Obtain Hepatitis Panel and Abdominal Ultrasound -Repeat CMP in AM   PTSD/Depression -C/w Duloxetine 20 mg po Daily  Hyperlipidemia -C/w Atorvastatin 40 mg po Daily  DVT prophylaxis:  Code Status: FULL CODE Family Communication: Discussed with Family at bedside Disposition Plan: Remain Inpatient   Consultants:   None   Procedures: None  Antimicrobials:  Anti-infectives    Start     Dose/Rate Route Frequency Ordered Stop   06/13/16 2000  metroNIDAZOLE (FLAGYL) IVPB 500 mg     500 mg 100 mL/hr over 60 Minutes Intravenous Every 8 hours 06/13/16 1854     06/13/16 2000  ciprofloxacin (CIPRO) IVPB 400 mg     400 mg 200 mL/hr over 60 Minutes Intravenous Every 12 hours 06/13/16 1906       Subjective: Seen and examined and stated abdomen was extremely sore still. States N/V have resolved. No CP or SOB. Denied any loose stools but stated he had 2 bowel movement earlier this AM.   Objective: Vitals:   06/12/16 2136 06/13/16 0500 06/13/16 0544 06/13/16 1344  BP: 133/75  133/76 126/70  Pulse: 96  100 97  Resp: (!) 22  20 19   Temp: 98.4 F (36.9 C)  98.6 F (37 C) 98.5 F (36.9 C)  TempSrc: Oral  Oral Oral  SpO2: 100%  100% 98%  Weight:  111.3 kg (245 lb 6 oz)    Height:        Intake/Output Summary (Last 24 hours) at 06/13/16 1909 Last data filed at 06/13/16 1800  Gross per 24 hour  Intake          2740.41 ml  Output             2300 ml  Net           440.41 ml   Filed Weights   06/12/16 2126 06/13/16 0500  Weight: 115.1 kg (253 lb 11.2 oz) 111.3 kg (245 lb 6 oz)   Examination: Physical Exam:  Constitutional: WN/WD obese Caucasian Male in NAD and appears calm but uncomfortable Eyes: Llids and conjunctivae normal, sclerae anicteric  ENMT: External Ears, Nose appear normal. Grossly normal hearing. Neck: Appears normal, supple, no cervical masses, normal ROM, no appreciable  thyromegaly, no JVD Respiratory: Diminished to auscultation bilaterally, no wheezing, rales, rhonchi or crackles. Normal respiratory effort and patient is not tachypenic. No accessory muscle use.  Cardiovascular: RRR, no murmurs / rubs / gallops. S1 and S2 auscultated. No extremity edema.  Abdomen: Soft, Tender to palpation in mid-epigastric region, non-distended. No masses palpated. No appreciable hepatosplenomegaly. Bowel sounds positive x4.  GU: Deferred. Musculoskeletal: No clubbing / cyanosis of digits/nails. No joint deformity upper and lower extremities. Skin: No rashes, lesions, ulcers on limited skin evaluation. No induration; Warm and dry.  Neurologic: CN 2-12 grossly intact with no focal deficits. . Romberg sign cerebellar reflexes not assessed.  Psychiatric: Normal judgment and insight. Alert and oriented x 3. Normal mood and appropriate affect.   Data Reviewed: I have personally  reviewed following labs and imaging studies  CBC:  Recent Labs Lab 06/12/16 1424 06/13/16 0508  WBC 4.1 3.3*  NEUTROABS 3.4 2.3  HGB 13.7 12.7*  HCT 38.8* 37.4*  MCV 90.0 90.3  PLT 89* 89*   Basic Metabolic Panel:  Recent Labs Lab 06/12/16 1424 06/12/16 2339 06/13/16 0508 06/13/16 1715  NA 128* 129* 131* 129*  K 4.3 4.8 4.3 3.6  CL 98* 101 101 98*  CO2 19* 21* 20* 23  GLUCOSE 151* 178* 148* 118*  BUN 18 15 13 12   CREATININE 0.75 0.74 0.65 0.65  CALCIUM 8.6* 8.1* 8.1* 8.2*   GFR: Estimated Creatinine Clearance: 123.4 mL/min (by C-G formula based on SCr of 0.65 mg/dL). Liver Function Tests:  Recent Labs Lab 06/12/16 1424 06/13/16 1715  AST 55* 61*  ALT 53 57  ALKPHOS 63 80  BILITOT 1.0 1.0  PROT 7.0 6.4*  ALBUMIN 3.3* 3.1*    Recent Labs Lab 06/12/16 1424  LIPASE 21   No results for input(s): AMMONIA in the last 168 hours. Coagulation Profile: No results for input(s): INR, PROTIME in the last 168 hours. Cardiac Enzymes: No results for input(s): CKTOTAL, CKMB,  CKMBINDEX, TROPONINI in the last 168 hours. BNP (last 3 results) No results for input(s): PROBNP in the last 8760 hours. HbA1C: No results for input(s): HGBA1C in the last 72 hours. CBG:  Recent Labs Lab 06/12/16 1503 06/12/16 2148 06/13/16 0746 06/13/16 1204 06/13/16 1644  GLUCAP 123* 193* 146* 139* 120*   Lipid Profile: No results for input(s): CHOL, HDL, LDLCALC, TRIG, CHOLHDL, LDLDIRECT in the last 72 hours. Thyroid Function Tests: No results for input(s): TSH, T4TOTAL, FREET4, T3FREE, THYROIDAB in the last 72 hours. Anemia Panel: No results for input(s): VITAMINB12, FOLATE, FERRITIN, TIBC, IRON, RETICCTPCT in the last 72 hours. Sepsis Labs:  Recent Labs Lab 06/12/16 1458  LATICACIDVEN 1.59    No results found for this or any previous visit (from the past 240 hour(s)).   Radiology Studies: Dg Chest 2 View  Result Date: 06/12/2016 CLINICAL DATA:  Nausea and vomiting.  Weakness. EXAM: CHEST  2 VIEW COMPARISON:  Chest x-ray 11/30/2016 . FINDINGS: Mediastinum and hilar structures normal. Heart size normal. No focal infiltrate. No pleural effusion or pneumothorax. Cervicothoracic spine fusion. IMPRESSION: No acute cardiopulmonary disease. Electronically Signed   By: Marcello Moores  Register   On: 06/12/2016 17:04   Ct Abdomen Pelvis W Contrast  Result Date: 06/13/2016 CLINICAL DATA:  History of colon cancer, substance abuse, diabetes, CAD. Suspicions for sepsis, with nausea, vomiting, fever, chills for the last 4 days. Fever 102. Pain all over. EXAM: CT ABDOMEN AND PELVIS WITH CONTRAST TECHNIQUE: Multidetector CT imaging of the abdomen and pelvis was performed using the standard protocol following bolus administration of intravenous contrast. CONTRAST:  11m ISOVUE-300 IOPAMIDOL (ISOVUE-300) INJECTION 61%, 367mISOVUE-300 IOPAMIDOL (ISOVUE-300) INJECTION 61% COMPARISON:  06/05/2011 and chest x-ray 06/12/2016 FINDINGS: Lower chest: Lung bases are unremarkable. Heart size is normal.  Coronary artery calcification versus stent. Hepatobiliary: The liver is nodular with enlarged caudate lobe compatible with cirrhosis. The gallbladder is present. Pancreas: Unremarkable. No pancreatic ductal dilatation or surrounding inflammatory changes. Spleen: Normal in size without focal abnormality. Adrenals/Urinary Tract: Normal adrenal glands. No hydronephrosis or renal mass. Stomach/Bowel: Normal appearance of the stomach. The duodenum is markedly abnormal. There is marked thickening of the duodenal wall, measuring up to 14 mm. Periduodenal inflammatory changes are present. Fluid tracks within the retroperitoneum. No evidence for extraluminal air however. These abnormalities involve the entire  length of the duodenum. The jejunum is normal in appearance. The colon has moderate stool burden. Suture line is identified at the junction of the descending colon with the sigmoid. In this region the lumen of the colon appears expanded but there is no evidence for obstruction or mass in this region. There are colonic diverticula but no evidence for acute diverticulitis. Vascular/Lymphatic: There is mild atherosclerotic calcification of the abdominal aorta. No aneurysm. Reproductive: Prostate and seminal vesicles are normal in appearance. Other: Anterior abdominal wall is unremarkable. No free pelvic fluid. Musculoskeletal: Degenerative changes are identified in the lumbar spine, particularly at L5-S1. Bilateral pars defects at L5. IMPRESSION: 1. Marked abnormality of the duodenum with significant wall thickening and inflammatory changes. There is retroperitoneal fluid but no evidence for extraluminal gas. Considerations include inflammatory or infectious duodenitis or peptic ulcer disease. Malignancy is much less likely. 2. Hepatic changes of cirrhosis. 3. Coronary artery calcification versus stent. 4. Colonic diverticulosis. 5. Postoperative changes in the colon. 6.  Aortic atherosclerosis. 7. Bilateral pars defects  at L5 associated with degenerative changes. Electronically Signed   By: Nolon Nations M.D.   On: 06/13/2016 17:01   Scheduled Meds: . amLODipine  10 mg Oral Daily  . aspirin EC  81 mg Oral Daily  . atorvastatin  40 mg Oral q1800  . famotidine  20 mg Oral BID  . gabapentin  400 mg Oral BID  . insulin aspart  0-15 Units Subcutaneous TID WC  . insulin aspart  0-5 Units Subcutaneous QHS  . insulin aspart  3 Units Subcutaneous TID WC  . insulin detemir  60 Units Subcutaneous Daily  . iopamidol      . iopamidol      . multivitamin with minerals  1 tablet Oral Daily  . pantoprazole  40 mg Oral BID  . sodium chloride flush  3 mL Intravenous Q12H   Continuous Infusions: . sodium chloride 100 mL/hr at 06/13/16 1847  . ciprofloxacin    . metronidazole      LOS: 0 days   Kerney Elbe, DO [facility] Pager [phone]  If 7PM-7AM, please contact night-coverage [email or web address] Password [email or web address] 06/13/2016, 7:09 PM

## 2016-06-13 NOTE — Progress Notes (Signed)
PHARMACY NOTE -  ciprofloxacin  Pharmacy has been assisting with dosing of Ciprofloxacin for IAI. Dosage remains stable at 400 mg IV q12 and need for further dosage adjustment appears unlikely at present.    Will sign off at this time.  Please reconsult if a change in clinical status warrants re-evaluation of dosage.  Reuel Boom, PharmD, BCPS Pager: [phone] 06/13/2016, 7:09 PM

## 2016-06-14 ENCOUNTER — Inpatient Hospital Stay (HOSPITAL_COMMUNITY): Payer: Medicare Other

## 2016-06-14 DIAGNOSIS — K529 Noninfective gastroenteritis and colitis, unspecified: Secondary | ICD-10-CM

## 2016-06-14 LAB — COMPREHENSIVE METABOLIC PANEL
ALBUMIN: 2.9 g/dL — AB (ref 3.5–5.0)
ALT: 50 U/L (ref 17–63)
AST: 49 U/L — AB (ref 15–41)
Alkaline Phosphatase: 72 U/L (ref 38–126)
Anion gap: 7 (ref 5–15)
BUN: 12 mg/dL (ref 6–20)
CHLORIDE: 103 mmol/L (ref 101–111)
CO2: 24 mmol/L (ref 22–32)
CREATININE: 0.59 mg/dL — AB (ref 0.61–1.24)
Calcium: 8.1 mg/dL — ABNORMAL LOW (ref 8.9–10.3)
GFR calc Af Amer: >60 mL/min (ref >60)
GLUCOSE: 146 mg/dL — AB (ref 65–99)
POTASSIUM: 3.8 mmol/L (ref 3.5–5.1)
SODIUM: 134 mmol/L — AB (ref 135–145)
Total Bilirubin: 0.7 mg/dL (ref 0.3–1.2)
Total Protein: 6.1 g/dL — ABNORMAL LOW (ref 6.5–8.1)

## 2016-06-14 LAB — GASTROINTESTINAL PANEL BY PCR, STOOL (REPLACES STOOL CULTURE)
ADENOVIRUS F40/41: NOT DETECTED
ASTROVIRUS: NOT DETECTED
CAMPYLOBACTER SPECIES: NOT DETECTED
CYCLOSPORA CAYETANENSIS: NOT DETECTED
Cryptosporidium: NOT DETECTED
ENTEROAGGREGATIVE E COLI (EAEC): NOT DETECTED
ENTEROPATHOGENIC E COLI (EPEC): NOT DETECTED
ENTEROTOXIGENIC E COLI (ETEC): NOT DETECTED
Entamoeba histolytica: NOT DETECTED
GIARDIA LAMBLIA: NOT DETECTED
Norovirus GI/GII: NOT DETECTED
PLESIMONAS SHIGELLOIDES: NOT DETECTED
ROTAVIRUS A: NOT DETECTED
Salmonella species: NOT DETECTED
Sapovirus (I, II, IV, and V): NOT DETECTED
Shiga like toxin producing E coli (STEC): NOT DETECTED
Shigella/Enteroinvasive E coli (EIEC): NOT DETECTED
VIBRIO SPECIES: NOT DETECTED
Vibrio cholerae: NOT DETECTED
Yersinia enterocolitica: NOT DETECTED

## 2016-06-14 LAB — CBC WITH DIFFERENTIAL/PLATELET
BASOS ABS: 0 10*3/uL (ref 0.0–0.1)
BASOS PCT: 0 %
EOS PCT: 1 %
Eosinophils Absolute: 0 10*3/uL (ref 0.0–0.7)
HCT: 34.1 % — ABNORMAL LOW (ref 39.0–52.0)
Hemoglobin: 11.9 g/dL — ABNORMAL LOW (ref 13.0–17.0)
LYMPHS PCT: 23 %
Lymphs Abs: 1 10*3/uL (ref 0.7–4.0)
MCH: 31.6 pg (ref 26.0–34.0)
MCHC: 34.9 g/dL (ref 30.0–36.0)
MCV: 90.5 fL (ref 78.0–100.0)
Monocytes Absolute: 0.6 10*3/uL (ref 0.1–1.0)
Monocytes Relative: 13 %
Neutro Abs: 2.8 10*3/uL (ref 1.7–7.7)
Neutrophils Relative %: 63 %
PLATELETS: 105 10*3/uL — AB (ref 150–400)
RBC: 3.77 MIL/uL — AB (ref 4.22–5.81)
RDW: 13.2 % (ref 11.5–15.5)
WBC: 4.4 10*3/uL (ref 4.0–10.5)

## 2016-06-14 LAB — HEMOGLOBIN A1C
Hgb A1c MFr Bld: 6.7 % — ABNORMAL HIGH (ref 4.8–5.6)
MEAN PLASMA GLUCOSE: 146 mg/dL

## 2016-06-14 LAB — MAGNESIUM: Magnesium: 2 mg/dL (ref 1.7–2.4)

## 2016-06-14 LAB — GLUCOSE, CAPILLARY
GLUCOSE-CAPILLARY: 171 mg/dL — AB (ref 65–99)
GLUCOSE-CAPILLARY: 231 mg/dL — AB (ref 65–99)
Glucose-Capillary: 120 mg/dL — ABNORMAL HIGH (ref 65–99)

## 2016-06-14 LAB — PHOSPHORUS: Phosphorus: 2.3 mg/dL — ABNORMAL LOW (ref 2.5–4.6)

## 2016-06-14 MED ORDER — CIPROFLOXACIN HCL 500 MG PO TABS
500.0000 mg | ORAL_TABLET | Freq: Two times a day (BID) | ORAL | Status: DC
  Administered 2016-06-14: 500 mg via ORAL
  Filled 2016-06-14: qty 1

## 2016-06-14 MED ORDER — HYDROCODONE-ACETAMINOPHEN 5-325 MG PO TABS: 1.0000 | ORAL_TABLET | ORAL | 0 refills | Status: DC | PRN

## 2016-06-14 MED ORDER — METRONIDAZOLE 500 MG PO TABS: 500.0000 mg | ORAL_TABLET | Freq: Three times a day (TID) | ORAL | 0 refills | Status: DC

## 2016-06-14 MED ORDER — PANTOPRAZOLE SODIUM 40 MG PO TBEC: 40.0000 mg | DELAYED_RELEASE_TABLET | Freq: Two times a day (BID) | ORAL | 0 refills | Status: DC

## 2016-06-14 MED ORDER — FAMOTIDINE 20 MG PO TABS: 20.0000 mg | ORAL_TABLET | Freq: Two times a day (BID) | ORAL | 0 refills | Status: DC

## 2016-06-14 MED ORDER — SODIUM PHOSPHATES 45 MMOLE/15ML IV SOLN
30.0000 mmol | Freq: Once | INTRAVENOUS | Status: AC
  Administered 2016-06-14: 30 mmol via INTRAVENOUS
  Filled 2016-06-14: qty 10

## 2016-06-14 MED ORDER — METRONIDAZOLE 500 MG PO TABS
500.0000 mg | ORAL_TABLET | Freq: Three times a day (TID) | ORAL | Status: DC
  Administered 2016-06-14: 500 mg via ORAL
  Filled 2016-06-14: qty 1

## 2016-06-14 MED ORDER — CIPROFLOXACIN HCL 500 MG PO TABS: 500.0000 mg | ORAL_TABLET | Freq: Two times a day (BID) | ORAL | 0 refills | Status: DC

## 2016-06-14 NOTE — Discharge Summary (Signed)
Physician Discharge Summary  Matthew Lewis KCL:[id number] DOB: [birth year] DOA: 06/12/2016  PCP: [name], MD  Admit date: 06/12/2016 Discharge date: 06/14/2016  Admitted From: Home Disposition:  Home  Recommendations for Outpatient Follow-up:  1. Follow up with PCP in 1-2 weeks 2. Follow up with PA Ellouise Newer in Gastroenterology on 06/18/16 at 10:15 for Hospital Follow up for Duodenitis 3. Please obtain CMP/CBC, Mag, Phos in one week 4. Follow up on H Pylori Test  Home Health: No Equipment/Devices: None   Discharge Condition: Stable and Improved  CODE STATUS: FULL CODE  Diet recommendation: Heart Healthy Carb Modified Diet  Brief/Interim Summary: Matthew H Gardneris a 61 y.o.malewith medical history significant for insulin-dependent diabetes mellitus, hypertension,coronary artery disease, and history of substance abuse in remission, now presenting to the emergency department with 4 days of nausea, nonbloody nonbilious vomiting, fatigue, and fevers with chills. Patient reports that he been in his usual state of health until the evening of 06/08/2016 when he developed the insidious onset of nausea with vomiting. This persisted throughout the night and he had become febrile the next day. Since that time, the patient has had persistent fevers with chills, nausea, and vomiting with any attempts at oral intake. He has been taking acetaminophen and ibuprofen for his symptoms with some momentary relief, but he vomits the tablets backup about half the time. He has never experienced these symptoms previously. He reports a sick contact at work who was experiencing the same symptoms, but also diarrhea last week. There has not been any recent long distance travel. He was worked up and admitted for N/V Fever and underwent CT Abdomen and Pelvis which showed marked Duodenitis. He was rehydrated with IVF, given Antiemetics and Antipyretics, and placed on IV Abx with Cipro/Flagyl. Patient  improved and was transitioned to po Abx. He was deemed medically stable to D/C home and will follow up with his PCP and with Gastroenterology as an outpatient.   Discharge Diagnoses:  Principal Problem:   Duodenitis Active Problems:   Hypertension   Hyperlipemia   Diabetes mellitus ([place])   CAD (coronary artery disease)- PCI 2001- no evaluation since   PTSD (post-traumatic stress disorder)   Hyponatremia   Nausea & vomiting   Thrombocytopenia ([facility])   Acute gastroenteritis   Abnormal LFTs  Nausea/vomiting with dehydration in the setting of Severe Duodenitis/Duodenal Abnormality, improved - Pt presented with 4 days fever, chills, nausea, and vomiting; no abd pain or diarrhea  - He has a coworker with similar sxs and this is suspected to reflect an acute viral gastroenteritis  - He was treated with 500 cc NS en route to hospital, and 1 liter NS in ED, but appears hypovolemic on admission  - Continued supportive care with IVF hydration with NS at 100 mL/hr, - C/w antiemetics, antipyretics  - CT of the Abd/Pelvis showed Marked abnormality of the duodenum with significant wall thickening (14 mm) and inflammatory changes. There is retroperitoneal fluid but no evidence for extraluminal gas. Considerations include inflammatory or infectious duodenitis or peptic ulcer disease. Malignancy is much less likely. - Ordered H Pylori Test but never done; Will defer to PCP or GI to get - Will start patient on Pantoprazole 40 mg po BID and with Famotidine 20 mg po BID - GI pathogen panel Negative - Started [employer] Coverage with IV Cipro/Flagyl and changed to po Cipro/Flagyl for total of 14 days  - Will have patient follow up with GI as an outpatient and an appointment was scheduled  with PA Ellouise Newer on 06/18/16 at 10:15 AM - Follow up with PCP  Hyponatremia - Serum sodium is 128 on admission; Improved to 134 - Sodium is chronically low, but typically low-130's  - Pt was hypovolemic, was given   fluid-resuscitation with NS with NS at 100 mL/hr  - Repeat CMP as an outpatient   Thrombocytopenia, improving - Platelet count 89k on admission without bleeding or rashand improved to 105 - Likely secondary to infectious process   - Repeat CBC as an outpatient   Insulin-dependent DM  - A1c was 6.1% in 2014; Repeat this visit was 6.7 - Managed at home with Jardiance, glipizide, metformin, and Levemir 60 units qAM and 10 units qHS; Can go back on those at D/C - C/w Gabapentin 400 mg po BID  - CBG's ranging from 120-231  Hypertension - BP is at goal  - Resume Lisinopril at D/C and continue with Amlodipine 10 mg po Daily  CAD - No Anginal complaints - CT Abd/Pelvis showed coronary artery calcification vs,. Stent and Aortic atherosclerosis  - Troponin undetectable  - Conitnue ASA 81 and Atorvastatin 40 mg po Daily - Follow up with PCP/Cardiology as an outpatient   Abnormal LFT's -AST was elevated at 61 and Trended down to 41 -CT Abd/Pelvis showed Hepatic Changes with Cirrhosis -Obtained Hepatitis Panel and Abdominal Ultrasound as an outpatient if still elevated -Repeat CMP as an outpatient   PTSD/Depression -C/w Duloxetine 20 mg po Daily  Hyperlipidemia -C/w Atorvastatin 40 mg po Daily  Hypophosphatemia -Phos Level was low at 2.3 -Replete with NaPhos IV 30 mmol -Repeat Phos level as an outpatient.   Discharge Instructions  Discharge Instructions    Call MD for:  difficulty breathing, headache or visual disturbances    Complete by:  As directed    Call MD for:  extreme fatigue    Complete by:  As directed    Call MD for:  persistant dizziness or light-headedness    Complete by:  As directed    Call MD for:  persistant nausea and vomiting    Complete by:  As directed    Call MD for:  redness, tenderness, or signs of infection (pain, swelling, redness, odor or green/yellow discharge around incision site)    Complete by:  As directed    Call MD for:  severe  uncontrolled pain    Complete by:  As directed    Call MD for:  temperature >100.4    Complete by:  As directed    Diet - low sodium heart healthy    Complete by:  As directed    Diet Carb Modified    Complete by:  As directed    Discharge instructions    Complete by:  As directed    Follow up with PCP and with [facility] as an outpatient. Take all medications as prescribed. If symptoms change or worsen please return to the ED for evaluation.   Increase activity slowly    Complete by:  As directed      Allergies as of 06/14/2016      Reactions   Other Other (See Comments)   Patient is recovering from drug addiction does not want any narcotics      Medication List    STOP taking these medications   INVOKANA 300 MG Tabs tablet Generic drug:  canagliflozin     TAKE these medications   acetaminophen 325 MG tablet Commonly known as:  TYLENOL Take 325 mg by mouth every  6 (six) hours as needed for moderate pain.   amLODipine 10 MG tablet Commonly known as:  NORVASC Take 10 mg by mouth daily.   aspirin EC 81 MG tablet Take 81 mg by mouth daily.   atorvastatin 40 MG tablet Commonly known as:  LIPITOR Take 1 tablet (40 mg total) by mouth daily at 6 PM.   ciprofloxacin 500 MG tablet Commonly known as:  CIPRO Take 1 tablet (500 mg total) by mouth 2 (two) times daily.   cyclobenzaprine 10 MG tablet Commonly known as:  FLEXERIL Take 10 mg by mouth 3 (three) times daily as needed for muscle spasms.   famotidine 20 MG tablet Commonly known as:  PEPCID Take 1 tablet (20 mg total) by mouth 2 (two) times daily.   gabapentin 400 MG capsule Commonly known as:  NEURONTIN Take 400 mg by mouth 2 (two) times daily.   glipiZIDE 5 MG tablet Commonly known as:  GLUCOTROL Take 5 mg by mouth 2 (two) times daily before a meal.   HYDROcodone-acetaminophen 5-325 MG tablet Commonly known as:  NORCO/VICODIN Take 1-2 tablets by mouth every 4 (four) hours as needed for  moderate pain.   ibuprofen 200 MG tablet Commonly known as:  ADVIL,MOTRIN Take 200 mg by mouth every 6 (six) hours as needed for moderate pain.   JARDIANCE 10 MG Tabs tablet Generic drug:  empagliflozin Take 10 mg by mouth daily.   LEVEMIR FLEXPEN 100 UNIT/ML Pen Generic drug:  Insulin Detemir Inject 10-60 Units into the skin 2 (two) times daily. Take 60 units in the am and Take 10 units in the pm   lisinopril 40 MG tablet Commonly known as:  PRINIVIL,ZESTRIL Take 40 mg by mouth daily.   metFORMIN 1000 MG tablet Commonly known as:  GLUCOPHAGE Take 1,000 mg by mouth 2 (two) times daily with a meal.   metroNIDAZOLE 500 MG tablet Commonly known as:  FLAGYL Take 1 tablet (500 mg total) by mouth every 8 (eight) hours.   multivitamin tablet Take 1 tablet by mouth daily.   pantoprazole 40 MG tablet Commonly known as:  PROTONIX Take 1 tablet (40 mg total) by mouth 2 (two) times daily.      Follow-up Information    [name], MD. Call in 1 week(s).   Specialty:  Internal Medicine Why:  Call to schedule an appointment within 1 week Contact information: [address] [place] [address] [phone]        [address], [place]. Go to.   Specialty:  Gastroenterology Why:  Appointment is on June 12, 18 at 10:15 AM for evaluation of Duodenitis Contact information: [place] [address] [phone]          Allergies  Allergen Reactions  . Other Other (See Comments)    Patient is recovering from drug addiction does not want any narcotics   Consultations:  None  Procedures/Studies: Dg Chest 2 View  Result Date: 06/12/2016 CLINICAL DATA:  Nausea and vomiting.  Weakness. EXAM: CHEST  2 VIEW COMPARISON:  Chest x-ray 11/30/2016 . FINDINGS: Mediastinum and hilar structures normal. Heart size normal. No focal infiltrate. No pleural effusion or pneumothorax. Cervicothoracic spine fusion. IMPRESSION: No acute  cardiopulmonary disease. Electronically Signed   By: Marcello Moores  Register   On: 06/12/2016 17:04   Ct Abdomen Pelvis W Contrast  Result Date: 06/13/2016 CLINICAL DATA:  History of colon cancer, substance abuse, diabetes, CAD. Suspicions for sepsis, with nausea, vomiting, fever, chills for  the last 4 days. Fever 102. Pain all over. EXAM: CT ABDOMEN AND PELVIS WITH CONTRAST TECHNIQUE: Multidetector CT imaging of the abdomen and pelvis was performed using the standard protocol following bolus administration of intravenous contrast. CONTRAST:  136m ISOVUE-300 IOPAMIDOL (ISOVUE-300) INJECTION 61%, 345mISOVUE-300 IOPAMIDOL (ISOVUE-300) INJECTION 61% COMPARISON:  06/05/2011 and chest x-ray 06/12/2016 FINDINGS: Lower chest: Lung bases are unremarkable. Heart size is normal. Coronary artery calcification versus stent. Hepatobiliary: The liver is nodular with enlarged caudate lobe compatible with cirrhosis. The gallbladder is present. Pancreas: Unremarkable. No pancreatic ductal dilatation or surrounding inflammatory changes. Spleen: Normal in size without focal abnormality. Adrenals/Urinary Tract: Normal adrenal glands. No hydronephrosis or renal mass. Stomach/Bowel: Normal appearance of the stomach. The duodenum is markedly abnormal. There is marked thickening of the duodenal wall, measuring up to 14 mm. Periduodenal inflammatory changes are present. Fluid tracks within the retroperitoneum. No evidence for extraluminal air however. These abnormalities involve the entire length of the duodenum. The jejunum is normal in appearance. The colon has moderate stool burden. Suture line is identified at the junction of the descending colon with the sigmoid. In this region the lumen of the colon appears expanded but there is no evidence for obstruction or mass in this region. There are colonic diverticula but no evidence for acute diverticulitis. Vascular/Lymphatic: There is mild atherosclerotic calcification of the abdominal aorta.  No aneurysm. Reproductive: Prostate and seminal vesicles are normal in appearance. Other: Anterior abdominal wall is unremarkable. No free pelvic fluid. Musculoskeletal: Degenerative changes are identified in the lumbar spine, particularly at L5-S1. Bilateral pars defects at L5. IMPRESSION: 1. Marked abnormality of the duodenum with significant wall thickening and inflammatory changes. There is retroperitoneal fluid but no evidence for extraluminal gas. Considerations include inflammatory or infectious duodenitis or peptic ulcer disease. Malignancy is much less likely. 2. Hepatic changes of cirrhosis. 3. Coronary artery calcification versus stent. 4. Colonic diverticulosis. 5. Postoperative changes in the colon. 6.  Aortic atherosclerosis. 7. Bilateral pars defects at L5 associated with degenerative changes. Electronically Signed   By: ElNolon Nations.D.   On: 06/13/2016 17:01   UsKoreabdomen Limited Ruq  Result Date: 06/14/2016 CLINICAL DATA:  Abnormal LFTs. EXAM: ULTRASOUND ABDOMEN LIMITED RIGHT UPPER QUADRANT COMPARISON:  CT 06/13/2016 . FINDINGS: Gallbladder: No gallstones or wall thickening visualized. No sonographic Murphy sign noted by sonographer. Common bile duct: Diameter: 3 mm Liver: Increased hepatic echogenicity. Liver has a slightly nodular contour. Cirrhosis cannot be excluded. No focal hepatic abnormality identified . Portal vein is patent with normal directional flow . IMPRESSION: 1.  No gallstones or biliary distention. 2. There is echogenic with a nodular contour suggestive possibility of cirrhosis. No focal hepatic abnormality identified . Electronically Signed   By: ThMarcello MooresRegister   On: 06/14/2016 07:39     Subjective: Seen and examined and felt tremendously better. No nausea or vomiting and had very little abdominal pain. No CP or SOB and was ready to go home.   Discharge Exam: Vitals:   06/13/16 2123 06/14/16 0431  BP: 111/69 117/75  Pulse: 91 90  Resp: 18 19  Temp: 98.6 F  (37 C) 99 F (37.2 C)   Vitals:   06/13/16 0544 06/13/16 1344 06/13/16 2123 06/14/16 0431  BP: 133/76 126/70 111/69 117/75  Pulse: 100 97 91 90  Resp: 20 19 18 19   Temp: 98.6 F (37 C) 98.5 F (36.9 C) 98.6 F (37 C) 99 F (37.2 C)  TempSrc: Oral Oral Oral Oral  SpO2: 100%  98% 98% 98%  Weight:    115.7 kg (255 lb 1.2 oz)  Height:       General: Pt is alert, awake, not in acute distress Cardiovascular: RRR, S1/S2 +, no rubs, no gallops Respiratory: CTA bilaterally, no wheezing, no rhonchi Abdominal: Soft, Mildly tender to palpate, ND, bowel sounds + Extremities: no edema, no cyanosis  The results of significant diagnostics from this hospitalization (including imaging, microbiology, ancillary and laboratory) are listed below for reference.    Microbiology: No results found for this or any previous visit (from the past 240 hour(s)).   Labs: BNP (last 3 results) No results for input(s): BNP in the last 8760 hours. Basic Metabolic Panel:  Recent Labs Lab 06/12/16 1424 06/12/16 2339 06/13/16 0508 06/13/16 1715 06/14/16 0519  NA 128* 129* 131* 129* 134*  K 4.3 4.8 4.3 3.6 3.8  CL 98* 101 101 98* 103  CO2 19* 21* 20* 23 24  GLUCOSE 151* 178* 148* 118* 146*  BUN 18 15 13 12 12   CREATININE 0.75 0.74 0.65 0.65 0.59*  CALCIUM 8.6* 8.1* 8.1* 8.2* 8.1*  MG  --   --   --   --  2.0  PHOS  --   --   --   --  2.3*   Liver Function Tests:  Recent Labs Lab 06/12/16 1424 06/13/16 1715 06/14/16 0519  AST 55* 61* 49*  ALT 53 57 50  ALKPHOS 63 80 72  BILITOT 1.0 1.0 0.7  PROT 7.0 6.4* 6.1*  ALBUMIN 3.3* 3.1* 2.9*    Recent Labs Lab 06/12/16 1424  LIPASE 21   No results for input(s): AMMONIA in the last 168 hours. CBC:  Recent Labs Lab 06/12/16 1424 06/13/16 0508 06/14/16 0519  WBC 4.1 3.3* 4.4  NEUTROABS 3.4 2.3 2.8  HGB 13.7 12.7* 11.9*  HCT 38.8* 37.4* 34.1*  MCV 90.0 90.3 90.5  PLT 89* 89* 105*   Cardiac Enzymes: No results for input(s): CKTOTAL,  CKMB, CKMBINDEX, TROPONINI in the last 168 hours. BNP: Invalid input(s): POCBNP CBG:  Recent Labs Lab 06/13/16 1204 06/13/16 1644 06/13/16 2122 06/14/16 0754 06/14/16 1213  GLUCAP 139* 120* 126* 120* 171*   D-Dimer No results for input(s): DDIMER in the last 72 hours. Hgb A1c  Recent Labs  06/12/16 2203  HGBA1C 6.7*   Lipid Profile No results for input(s): CHOL, HDL, LDLCALC, TRIG, CHOLHDL, LDLDIRECT in the last 72 hours. Thyroid function studies No results for input(s): TSH, T4TOTAL, T3FREE, THYROIDAB in the last 72 hours.  Invalid input(s): FREET3 Anemia work up No results for input(s): VITAMINB12, FOLATE, FERRITIN, TIBC, IRON, RETICCTPCT in the last 72 hours. Urinalysis    Component Value Date/Time   COLORURINE YELLOW 06/12/2016 1427   APPEARANCEUR CLEAR 06/12/2016 1427   LABSPEC 1.027 06/12/2016 1427   PHURINE 6.0 06/12/2016 1427   GLUCOSEU >=500 (A) 06/12/2016 1427   HGBUR NEGATIVE 06/12/2016 1427   BILIRUBINUR NEGATIVE 06/12/2016 1427   KETONESUR 80 (A) 06/12/2016 1427   PROTEINUR NEGATIVE 06/12/2016 1427   UROBILINOGEN 0.2 08/15/2011 1816   NITRITE NEGATIVE 06/12/2016 1427   LEUKOCYTESUR NEGATIVE 06/12/2016 1427   Sepsis Labs Invalid input(s): PROCALCITONIN,  WBC,  LACTICIDVEN Microbiology No results found for this or any previous visit (from the past 240 hour(s)).  Time coordinating discharge: 35 minutes  SIGNED:  Kerney Elbe, DO [facility] 06/14/2016, 2:28 PM Pager [phone]  If 7PM-7AM, please contact night-coverage [email or web address] Password [email or web address]

## 2016-06-14 NOTE — Care Management Note (Signed)
Case Management Note  Patient Details  Name: Matthew Lewis MRN: [id number] Date of Birth: 07/01/1955  Subjective/Objective:                    Action/Plan:Pt states there are no HH needs at present time. Discharge plans are home with his wife.    Expected Discharge Date:  06/14/16               Expected Discharge Plan:  Home/Self Care  In-House Referral:     Discharge planning Services  CM Consult  Post Acute Care Choice:    Choice offered to:     DME Arranged:    DME Agency:     HH Arranged:    HH Agency:     Status of Service:  Completed, signed off  If discussed at [employer] of Stay Meetings, dates discussed:    Additional CommentsPurcell Mouton, RN 06/14/2016, 5:20 PM

## 2016-06-14 NOTE — Progress Notes (Signed)
Patient is stable for discharge. Discharge instructions and medications have been reviewed with the patient and all questions answered. Patient refused his prescription for Vicodin. He states he is a recovering addict and will not take the medication. Prescription shredded and placed in shred box.   Othella Boyer [facility] 06/14/2016 6:53 PM

## 2016-06-15 LAB — HEPATITIS PANEL, ACUTE
HEP B S AG: NEGATIVE
Hep A IgM: NEGATIVE
Hep B C IgM: NEGATIVE

## 2016-06-18 ENCOUNTER — Ambulatory Visit (INDEPENDENT_AMBULATORY_CARE_PROVIDER_SITE_OTHER): Payer: Medicare Other | Admitting: Physician Assistant

## 2016-06-18 ENCOUNTER — Encounter: Payer: Self-pay | Admitting: Gastroenterology

## 2016-06-18 ENCOUNTER — Encounter: Payer: Self-pay | Admitting: Physician Assistant

## 2016-06-18 ENCOUNTER — Other Ambulatory Visit (INDEPENDENT_AMBULATORY_CARE_PROVIDER_SITE_OTHER): Payer: Medicare Other

## 2016-06-18 VITALS — BP 104/68 | HR 68 | Ht 71.0 in | Wt 246.0 lb

## 2016-06-18 DIAGNOSIS — R7989 Other specified abnormal findings of blood chemistry: Secondary | ICD-10-CM

## 2016-06-18 DIAGNOSIS — R945 Abnormal results of liver function studies: Principal | ICD-10-CM

## 2016-06-18 DIAGNOSIS — R935 Abnormal findings on diagnostic imaging of other abdominal regions, including retroperitoneum: Secondary | ICD-10-CM

## 2016-06-18 DIAGNOSIS — R1013 Epigastric pain: Secondary | ICD-10-CM | POA: Diagnosis not present

## 2016-06-18 DIAGNOSIS — K921 Melena: Secondary | ICD-10-CM | POA: Diagnosis not present

## 2016-06-18 LAB — CBC WITH DIFFERENTIAL/PLATELET
BASOS PCT: 0.8 % (ref 0.0–3.0)
Basophils Absolute: 0.1 10*3/uL (ref 0.0–0.1)
EOS PCT: 3.5 % (ref 0.0–5.0)
Eosinophils Absolute: 0.4 10*3/uL (ref 0.0–0.7)
HCT: 41.5 % (ref 39.0–52.0)
HEMOGLOBIN: 14.4 g/dL (ref 13.0–17.0)
LYMPHS ABS: 2.4 10*3/uL (ref 0.7–4.0)
Lymphocytes Relative: 20.7 % (ref 12.0–46.0)
MCHC: 34.6 g/dL (ref 30.0–36.0)
MCV: 93.2 fl (ref 78.0–100.0)
MONO ABS: 0.9 10*3/uL (ref 0.1–1.0)
Monocytes Relative: 7.8 % (ref 3.0–12.0)
Neutro Abs: 7.6 10*3/uL (ref 1.4–7.7)
Neutrophils Relative %: 67.2 % (ref 43.0–77.0)
Platelets: 422 10*3/uL — ABNORMAL HIGH (ref 150.0–400.0)
RBC: 4.45 Mil/uL (ref 4.22–5.81)
RDW: 13.5 % (ref 11.5–15.5)
WBC: 11.4 10*3/uL — AB (ref 4.0–10.5)

## 2016-06-18 LAB — COMPREHENSIVE METABOLIC PANEL
ALBUMIN: 4.2 g/dL (ref 3.5–5.2)
ALK PHOS: 97 U/L (ref 39–117)
ALT: 70 U/L — ABNORMAL HIGH (ref 0–53)
AST: 50 U/L — ABNORMAL HIGH (ref 0–37)
BUN: 14 mg/dL (ref 6–23)
CHLORIDE: 98 meq/L (ref 96–112)
CO2: 30 mEq/L (ref 19–32)
Calcium: 9.7 mg/dL (ref 8.4–10.5)
Creatinine, Ser: 0.82 mg/dL (ref 0.40–1.50)
GFR: 100.97 mL/min (ref >60.00)
Glucose, Bld: 113 mg/dL — ABNORMAL HIGH (ref 70–99)
POTASSIUM: 4.4 meq/L (ref 3.5–5.1)
SODIUM: 135 meq/L (ref 135–145)
TOTAL PROTEIN: 7.9 g/dL (ref 6.0–8.3)
Total Bilirubin: 0.7 mg/dL (ref 0.2–1.2)

## 2016-06-18 NOTE — Patient Instructions (Signed)
If you are age 63 or older, your body mass index should be between 23-30. Your Body mass index is 34.31 kg/m. If this is out of the aforementioned range listed, please consider follow up with your Primary Care Provider.  If you are age 5 or younger, your body mass index should be between 19-25. Your Body mass index is 34.31 kg/m. If this is out of the aformentioned range listed, please consider follow up with your Primary Care Provider.   You have been scheduled for an endoscopy. Please follow written instructions given to you at your visit today. If you use inhalers (even only as needed), please bring them with you on the day of your procedure. Your physician has requested that you go to www.startemmi.com and enter the access code given to you at your visit today. This web site gives a general overview about your procedure. However, you should still follow specific instructions given to you by our office regarding your preparation for the procedure.  Your physician has requested that you go to the basement for the following lab work before leaving today: CBC w/diff CMET  Thank you for choosing me and [facility].   Tye Savoy, NP

## 2016-06-18 NOTE — Progress Notes (Signed)
Chief Complaint: Follow-up from recent hospital visit for duodenitis  HPI:  Matthew Lewis is a 61 year old Caucasian male with a past medical history of arthritis, alcohol abuse in remission, colon cancer, diabetes, fatigue, PTSD, type 2 diabetes and multiple others listed below, who was referred to me by [name], MD for a complaint of duodenitis.      Per chart review patient was recently discharged from the hospital, he was admitted 06/12/16-06/14/16 for a diagnosis of duodenitis. He initially presented with 4 days of nausea, nonbloody nonbilious vomiting, fatigue, fever and chills. This had all started 06/08/16. Patient had been taking acetaminophen and ibuprofen for symptoms with some momentary relief, but would vomit the tablets back up most of the time. He did report a sick contact at work with the same symptoms. Patient did have a CT of the abdomen and pelvis which showed marked duodenitis. He was rehydrated with IVF, given antiemetics and antipyretics and placed on IV antibiotics with Cipro and Flagyl. He improved and was transitioned to by mouth antibiotics and sent home with 2 weeks of Cipro/ Flagyl. Patient was also started on Pantoprazole 40 mg twice a day and Famotidine 20 mg twice a day. GI pathogen panel was negative. Patient was noted to have abnormal LFTs with an AST elevated at 61 which trended down to 41 and a CT abdomen and pelvis showed hepatic changes consistent with cirrhosis. He had no further testing for this during admission.   Today, the patient tells me that since leaving the hospital last Friday he has continued to "grow weaker". He tells me that he is just been laying around on his couch unable to get up and do anything because "I don't have the energy". Patient also tells that when he does stand up and move around he gets somewhat dizzy. Patient describes he has had no further fever or chills since being discharged from the hospital, but prior to admission this was around  102 at its highest. The patient also describes he has had no further nausea and vomiting but has noticed a decrease in his appetite. Currently, he continues with an epigastric "pressure" that is rated as a 4-5/10. This is not seeing any better worse with anything and is constantly there. Patient tells me that he is "eating okay", when "I feel like eating". He does describe some dark stools for the past 2 weeks which have been getting "increasingly darker". Patient tells me that now these are almost "black". He tells me he has been having one solid stool per day for 2 weeks.    Patient has previously been seen in our clinic for colonoscopy 01/12/13 by Dr. Deatra Ina. This was done for history of colon polyps in 2008 and in 2011 as well as a history of colon cancer which was resected in 2007. Patient had findings of a colon polyp and postoperative changes. Recommended to have a repeat in 5 years.    Patient denies continued fever or chills, bright red blood in his stool, weight loss, heartburn, reflux, dysphagia or increased gas or bloating.  Past Medical History:  Diagnosis Date  . Abscess of anal and rectal regions    horse shoe abscess  . Arthritis    "everywhere; especially in my spine"  . Colon cancer ([place]) 05/2005  . DM type 2 with diabetic peripheral neuropathy (H[facility])    left foot  . Fatigue   . Fistula, anal    multiple  . History of substance abuse  last alcohol was 08/2011; last marijuania was 08/2011  . Poor circulation   . PTSD (post-traumatic stress disorder)    "severe"  . Sleep deprivation   . Type II diabetes mellitus ([place])     Past Surgical History:  Procedure Laterality Date  . ABSCESS DRAINAGE     "probably 15-20 so far at least; rectal"  . ANAL FISTULECTOMY  12/13/1998  . ANTERIOR FUSION CERVICAL SPINE  2010   "triple"; "did 2 surgeries on the same day in [id number] due to complications"  . CORONARY ANGIOPLASTY  2001  . detached muscle  2010   right chest; "after cervical fusion  complications"  . ELBOW SURGERY  2007   "cut out part of a muscle"; left  . EXAMINATION UNDER ANESTHESIA  03/22/2005   fistula  . EXAMINATION UNDER ANESTHESIA  06/06/2011   Procedure: EXAM UNDER ANESTHESIA;  Surgeon: Stark Klein, MD;  Location: [facility];  Service: General;  Laterality: N/A;  . HEMICOLECTOMY  06/06/2005   left  . INCISION AND DRAINAGE PERIRECTAL ABSCESS  03/30/2005  . INCISION AND DRAINAGE PERIRECTAL ABSCESS  12/13/2004  . INCISION AND DRAINAGE PERIRECTAL ABSCESS  07/14/2001  . INCISION AND DRAINAGE PERIRECTAL ABSCESS  08/12/2010   horseshoe abscess; Dr Redmond Pulling  . INCISION AND DRAINAGE PERIRECTAL ABSCESS  06/06/2011   Procedure: IRRIGATION AND DEBRIDEMENT PERIRECTAL ABSCESS;  Surgeon: Stark Klein, MD;  Location: [facility];  Service: General;  Laterality: N/A;  . LUMBAR [place]  . PERCUTANEOUS PINNING PHALANX FRACTURE OF HAND  ~ 2008   "plates in 2 places"; right  . THORACIC DISCECTOMY  1990's    Current Outpatient Prescriptions  Medication Sig Dispense Refill  . acetaminophen (TYLENOL) 325 MG tablet Take 325 mg by mouth every 6 (six) hours as needed for moderate pain.    Marland Kitchen amLODipine (NORVASC) 10 MG tablet Take 10 mg by mouth daily.    Marland Kitchen aspirin EC 81 MG tablet Take 81 mg by mouth daily.    Marland Kitchen atorvastatin (LIPITOR) 40 MG tablet Take 1 tablet (40 mg total) by mouth daily at 6 PM. 30 tablet 0  . ciprofloxacin (CIPRO) 500 MG tablet Take 1 tablet (500 mg total) by mouth 2 (two) times daily. 26 tablet 0  . cyclobenzaprine (FLEXERIL) 10 MG tablet Take 10 mg by mouth 3 (three) times daily as needed for muscle spasms.    . empagliflozin (JARDIANCE) 10 MG TABS tablet Take 10 mg by mouth daily.    . famotidine (PEPCID) 20 MG tablet Take 1 tablet (20 mg total) by mouth 2 (two) times daily. 60 tablet 0  . gabapentin (NEURONTIN) 400 MG capsule Take 400 mg by mouth 2 (two) times daily.    Marland Kitchen glipiZIDE (GLUCOTROL) 5 MG tablet Take 5 mg by mouth 2 (two) times daily before a meal.    .  HYDROcodone-acetaminophen (NORCO/VICODIN) 5-325 MG tablet Take 1-2 tablets by mouth every 4 (four) hours as needed for moderate pain. 10 tablet 0  . ibuprofen (ADVIL,MOTRIN) 200 MG tablet Take 200 mg by mouth every 6 (six) hours as needed for moderate pain.    . Insulin Detemir (LEVEMIR FLEXPEN) 100 UNIT/ML Pen Inject 10-60 Units into the skin 2 (two) times daily. Take 60 units in the am and Take 10 units in the pm    . lisinopril (PRINIVIL,ZESTRIL) 40 MG tablet Take 40 mg by mouth daily.    . metFORMIN (GLUCOPHAGE) 1000 MG tablet Take 1,000 mg by mouth 2 (two) times daily with a  meal.    . metroNIDAZOLE (FLAGYL) 500 MG tablet Take 1 tablet (500 mg total) by mouth every 8 (eight) hours. 39 tablet 0  . Multiple Vitamin (MULTIVITAMIN) tablet Take 1 tablet by mouth daily.    . pantoprazole (PROTONIX) 40 MG tablet Take 1 tablet (40 mg total) by mouth 2 (two) times daily. 60 tablet 0   No current facility-administered medications for this visit.     Allergies as of 06/18/2016 - Review Complete 06/18/2016  Allergen Reaction Noted  . Other Other (See Comments) 05/15/2012    Family History  Problem Relation Age of Onset  . Heart disease Father   . Heart disease Mother   . Cancer Mother   . Cancer Sister   . Heart disease Brother     Social History   Social History  . Marital status: Married    Spouse name: N/A  . Number of children: N/A  . Years of education: N/A   Occupational History  . Not on file.   Social History Main Topics  . Smoking status: Former Smoker    Packs/day: 1.00    Years: 10.00    Types: Cigarettes    Quit date: 01/08/1991  . Smokeless tobacco: Never Used  . Alcohol use Yes     Comment: 06/05/11 "drank enough when I was younger to last me my whole life; don't remember when I had my last drink, maybe 2012"  . Drug use: Yes    Types: Oxycodone, Marijuana     Comment: 06/05/11 "pot head in my younger days; 2009 dr had me on oxycodone and valium for almost 32yr"  .  Sexual activity: Not Currently   Other Topics Concern  . Not on file   Social History Narrative  . No narrative on file    Review of Systems:    Constitutional: Positive for weakness Skin: No rash  Cardiovascular: No chest pain  Respiratory: No SOB  Gastrointestinal: See HPI and otherwise negative Genitourinary: No dysuria  Neurological: Positive for dizziness Musculoskeletal: No new muscle or joint pain Hematologic: No bruising Psychiatric: Positive history of PTSD   Physical Exam:  Vital signs: BP 104/68   Pulse 68   Ht 5\' 11"  (1.803 m)   Wt 246 lb (111.6 kg)   BMI 34.31 kg/m    Constitutional:  Ill appearing Pleasant Caucasian male appears to be in NAD, Well developed, Well nourished, alert and cooperative Head:  Normocephalic and atraumatic. Eyes:   PEERL, EOMI. No icterus. Conjunctiva pink. Ears:  Normal auditory acuity. Neck:  Supple Throat: Oral cavity and pharynx without inflammation, swelling or lesion.  Respiratory: Respirations even and unlabored. Lungs clear to auscultation bilaterally.   No wheezes, crackles, or rhonchi.  Cardiovascular: Normal S1, S2. No MRG. Regular rate and rhythm. No peripheral edema, cyanosis or pallor.  Gastrointestinal:  Soft, nondistended, Moderate epigastric ttp. No rebound or guarding. Normal bowel sounds. No appreciable masses or hepatomegaly. Rectal:  Not performed.  Msk:  Symmetrical without gross deformities. Without edema, no deformity or joint abnormality.  Neurologic:  Alert and  oriented x4;  grossly normal neurologically.  Skin:   Dry and intact without significant lesions or rashes. Psychiatric: Demonstrates good judgement and reason without abnormal affect or behaviors.  MOST RECENT LABS AND IMAGING: CBC    Component Value Date/Time   WBC 4.4 06/14/2016 0519   RBC 3.77 (L) 06/14/2016 0519   HGB 11.9 (L) 06/14/2016 0519   HGB 15.2 07/18/2006 1420   HCT 34.1 (L)  06/14/2016 0519   HCT 42.8 07/18/2006 1420   PLT 105  (L) 06/14/2016 0519   PLT 193 07/18/2006 1420   MCV 90.5 06/14/2016 0519   MCV 93.3 07/18/2006 1420   MCH 31.6 06/14/2016 0519   MCHC 34.9 06/14/2016 0519   RDW 13.2 06/14/2016 0519   RDW 13.5 07/18/2006 1420   LYMPHSABS 1.0 06/14/2016 0519   LYMPHSABS 3.0 07/18/2006 1420   MONOABS 0.6 06/14/2016 0519   MONOABS 0.7 07/18/2006 1420   EOSABS 0.0 06/14/2016 0519   EOSABS 0.3 07/18/2006 1420   BASOSABS 0.0 06/14/2016 0519   BASOSABS 0.1 07/18/2006 1420    CMP     Component Value Date/Time   NA 134 (L) 06/14/2016 0519   K 3.8 06/14/2016 0519   CL 103 06/14/2016 0519   CO2 24 06/14/2016 0519   GLUCOSE 146 (H) 06/14/2016 0519   BUN 12 06/14/2016 0519   CREATININE 0.59 (L) 06/14/2016 0519   CALCIUM 8.1 (L) 06/14/2016 0519   PROT 6.1 (L) 06/14/2016 0519   ALBUMIN 2.9 (L) 06/14/2016 0519   AST 49 (H) 06/14/2016 0519   ALT 50 06/14/2016 0519   ALKPHOS 72 06/14/2016 0519   BILITOT 0.7 06/14/2016 0519   GFRNONAA >60 06/14/2016 0519   GFRAA >60 06/14/2016 0519   CT ABDOMEN AND PELVIS WITH CONTRAST 06/13/16  TECHNIQUE: Multidetector CT imaging of the abdomen and pelvis was performed using the standard protocol following bolus administration of intravenous contrast.  CONTRAST:  121mL ISOVUE-300 IOPAMIDOL (ISOVUE-300) INJECTION 61%, 85mL ISOVUE-300 IOPAMIDOL (ISOVUE-300) INJECTION 61%  COMPARISON:  06/05/2011 and chest x-ray 06/12/2016  FINDINGS: Lower chest: Lung bases are unremarkable. Heart size is normal. Coronary artery calcification versus stent.  Hepatobiliary: The liver is nodular with enlarged caudate lobe compatible with cirrhosis. The gallbladder is present.  Pancreas: Unremarkable. No pancreatic ductal dilatation or surrounding inflammatory changes.  Spleen: Normal in size without focal abnormality.  Adrenals/Urinary Tract: Normal adrenal glands. No hydronephrosis or renal mass.  Stomach/Bowel: Normal appearance of the stomach.  The duodenum is  markedly abnormal. There is marked thickening of the duodenal wall, measuring up to 14 mm. Periduodenal inflammatory changes are present. Fluid tracks within the retroperitoneum. No evidence for extraluminal air however. These abnormalities involve the entire length of the duodenum.  The jejunum is normal in appearance. The colon has moderate stool burden. Suture line is identified at the junction of the descending colon with the sigmoid. In this region the lumen of the colon appears expanded but there is no evidence for obstruction or mass in this region. There are colonic diverticula but no evidence for acute diverticulitis.  Vascular/Lymphatic: There is mild atherosclerotic calcification of the abdominal aorta. No aneurysm.  Reproductive: Prostate and seminal vesicles are normal in appearance.  Other: Anterior abdominal wall is unremarkable. No free pelvic fluid.  Musculoskeletal: Degenerative changes are identified in the lumbar spine, particularly at L5-S1. Bilateral pars defects at L5.  IMPRESSION: 1. Marked abnormality of the duodenum with significant wall thickening and inflammatory changes. There is retroperitoneal fluid but no evidence for extraluminal gas. Considerations include inflammatory or infectious duodenitis or peptic ulcer disease. Malignancy is much less likely. 2. Hepatic changes of cirrhosis. 3. Coronary artery calcification versus stent. 4. Colonic diverticulosis. 5. Postoperative changes in the colon. 6.  Aortic atherosclerosis. 7. Bilateral pars defects at L5 associated with degenerative changes.   Electronically Signed   By: Nolon Nations M.D.   On: 06/13/2016 17:01  EXAM: ULTRASOUND ABDOMEN LIMITED RIGHT UPPER QUADRANT 06/14/16  COMPARISON:  CT 06/13/2016 .  FINDINGS: Gallbladder:  No gallstones or wall thickening visualized. No sonographic Murphy sign noted by sonographer.  Common bile duct:  Diameter: 3  mm  Liver:  Increased hepatic echogenicity. Liver has a slightly nodular contour. Cirrhosis cannot be excluded. No focal hepatic abnormality identified . Portal vein is patent with normal directional flow .  IMPRESSION: 1.  No gallstones or biliary distention.  2. There is echogenic with a nodular contour suggestive possibility of cirrhosis. No focal hepatic abnormality identified .   Electronically Signed   By: Marcello Moores  Register   On: 06/14/2016 07:39  Assessment: 1. Melena: Most recent hemoglobin 11.9 4 days ago, patient reports daily dark/black stools over the past 2 weeks which have been "getting darker", these are solid and only once per day, has had some accompanying dizziness, duodenitis seen on CT above; question relation to duodenitis versus PUD versus gastritis versus other 2. Abnormal CT abdomen pelvis: As above showing cirrhosis and duodenitis 3. Epigastric pain: Likely related to inflammation in duodenum seen on CT 4. Elevated LFTs: Hepatitis panel -06/14/16, CT and ultrasound as above showing cirrhosis, patient does have history of alcohol abuse, most recent CMP 06/14/16 shows AST of 49 down from 61 on 06/13/16, patient does have a thrombocytopenia with platelets at 105  Plan: 1. Repeating CBC and CMP today, patient will likely need further workup for cirrhosis but this can be done after patient's acute symptoms of epigastric pain and melena have been investigated. 2. Encouraged patient to continue abstinence from alcohol and NSAIDs 3. Scheduled patient for an EGD in the [facility] with Dr. Silverio Decamp, as she is the supervising physician today, tomorrow. Did discuss risks, benefits, limitations and alternatives the patient agrees to proceed. 4. Patient to continue his Pantoprazole 40 mg twice a day and Famotidine 20 mg twice a day 5. Patient to follow in clinic per recommendations from Dr. Silverio Decamp after time of EGD.  Matthew Newer, PA-C [facility] 06/18/2016,  10:20 AM  Cc: [name], MD

## 2016-06-19 ENCOUNTER — Ambulatory Visit (AMBULATORY_SURGERY_CENTER): Payer: Medicare Other | Admitting: Gastroenterology

## 2016-06-19 ENCOUNTER — Encounter: Payer: Self-pay | Admitting: Gastroenterology

## 2016-06-19 VITALS — BP 104/59 | HR 81 | Temp 97.1°F | Resp 14 | Ht 71.0 in | Wt 246.0 lb

## 2016-06-19 DIAGNOSIS — K2971 Gastritis, unspecified, with bleeding: Secondary | ICD-10-CM

## 2016-06-19 DIAGNOSIS — K921 Melena: Secondary | ICD-10-CM

## 2016-06-19 DIAGNOSIS — K269 Duodenal ulcer, unspecified as acute or chronic, without hemorrhage or perforation: Secondary | ICD-10-CM

## 2016-06-19 DIAGNOSIS — K3189 Other diseases of stomach and duodenum: Secondary | ICD-10-CM | POA: Diagnosis not present

## 2016-06-19 DIAGNOSIS — R945 Abnormal results of liver function studies: Secondary | ICD-10-CM

## 2016-06-19 DIAGNOSIS — R7989 Other specified abnormal findings of blood chemistry: Secondary | ICD-10-CM

## 2016-06-19 DIAGNOSIS — R1013 Epigastric pain: Secondary | ICD-10-CM | POA: Diagnosis present

## 2016-06-19 MED ORDER — SODIUM CHLORIDE 0.9 % IV SOLN
500.0000 mL | INTRAVENOUS | Status: DC
Start: 2016-06-19 — End: 2017-02-25

## 2016-06-19 NOTE — Progress Notes (Signed)
Called to room to assist during endoscopic procedure.  Patient ID and intended procedure confirmed with present staff. Received instructions for my participation in the procedure from the performing physician.  

## 2016-06-19 NOTE — Progress Notes (Signed)
Pt's states no medical or surgical changes since previsit or office visit. 

## 2016-06-19 NOTE — Patient Instructions (Signed)
YOU HAD AN ENDOSCOPIC PROCEDURE TODAY AT [facility]:   Refer to the procedure report that was given to you for any specific questions about what was found during the examination.  If the procedure report does not answer your questions, please call your gastroenterologist to clarify.  If you requested that your care partner not be given the details of your procedure findings, then the procedure report has been included in a sealed envelope for you to review at your convenience later.  YOU SHOULD EXPECT: Some feelings of bloating in the abdomen. Passage of more gas than usual.  Walking can help get rid of the air that was put into your GI tract during the procedure and reduce the bloating. If you had a lower endoscopy (such as a colonoscopy or flexible sigmoidoscopy) you may notice spotting of blood in your stool or on the toilet paper. If you underwent a bowel prep for your procedure, you may not have a normal bowel movement for a few days.  Please Note:  You might notice some irritation and congestion in your nose or some drainage.  This is from the oxygen used during your procedure.  There is no need for concern and it should clear up in a day or so.  SYMPTOMS TO REPORT IMMEDIATELY:     Following upper endoscopy (EGD)  Vomiting of blood or coffee ground material  New chest pain or pain under the shoulder blades  Painful or persistently difficult swallowing  New shortness of breath  Fever of 100F or higher  Black, tarry-looking stools  For urgent or emergent issues, a gastroenterologist can be reached at any hour by calling [phone].   DIET:  We do recommend a small meal at first, but then you may proceed to your regular diet.  Drink plenty of fluids but you should avoid alcoholic beverages for 24 hours.  ACTIVITY:  You should plan to take it easy for the rest of today and you should NOT DRIVE or use heavy machinery until tomorrow (because of the sedation medicines  used during the test).    FOLLOW UP: Our staff will call the number listed on your records the next business day following your procedure to check on you and address any questions or concerns that you may have regarding the information given to you following your procedure. If we do not reach you, we will leave a message.  However, if you are feeling well and you are not experiencing any problems, there is no need to return our call.  We will assume that you have returned to your regular daily activities without incident.  If any biopsies were taken you will be contacted by phone or by letter within the next 1-3 weeks.  Please call us at [phone] if you have not heard about the biopsies in 3 weeks.    SIGNATURES/CONFIDENTIALITY: You and/or your care partner have signed paperwork which will be entered into your electronic medical record.  These signatures attest to the fact that that the information above on your After Visit Summary has been reviewed and is understood.  Full responsibility of the confidentiality of this discharge information lies with you and/or your care-partner.  NO ASPIRIN, ASPIRIN CONTAINING PRODUCTS (BC OR GOODY POWDERS) OR NSAIDS (IBUPROFEN, ADVIL, ALEVE, AND MOTRIN) FOR ; TYLENOL IS OK TO TAKE  Resume remainder of medications. Return to [facility] in 2 months. Information given on Gastritis.

## 2016-06-19 NOTE — Op Note (Signed)
[facility] Patient Name: Davison Ohms Procedure Date: 06/19/2016 2:43 PM MRN: [id number] Endoscopist: Mauri Pole , MD Age: 61 Referring MD:  Date of Birth: Jul 26, 1955 Gender: Male Account #: [id number] Procedure:                Upper GI endoscopy Indications:              Recent gastrointestinal bleeding, Suspected upper                            gastrointestinal bleeding, Iron deficiency anemia                            due to suspected upper gastrointestinal bleeding,                            Abnormal CT of the GI tract Medicines:                Monitored Anesthesia Care Procedure:                Pre-Anesthesia Assessment:                           - Prior to the procedure, a History and Physical                            was performed, and patient medications and                            allergies were reviewed. The patient's tolerance of                            previous anesthesia was also reviewed. The risks                            and benefits of the procedure and the sedation                            options and risks were discussed with the patient.                            All questions were answered, and informed consent                            was obtained. Prior Anticoagulants: The patient has                            taken no previous anticoagulant or antiplatelet                            agents. ASA Grade Assessment: III - A patient with                            severe systemic disease. After reviewing the risks  and benefits, the patient was deemed in                            satisfactory condition to undergo the procedure.                           After obtaining informed consent, the endoscope was                            passed under direct vision. Throughout the                            procedure, the patient's blood pressure, pulse, and                            oxygen saturations  were monitored continuously. The                            Endoscope was introduced through the mouth, and                            advanced to the second part of duodenum. The upper                            GI endoscopy was accomplished without difficulty.                            The patient tolerated the procedure well. Scope In: Scope Out: Findings:                 Three columns of non-bleeding grade I varices were                            found in the entire esophagus, 38 cm from the                            incisors. They were less than 5 mm in largest                            diameter. No stigmata of recent bleeding were                            evident and no red wale signs were present. Non                            obstructive schatzki's ring                           Scattered severe inflammation with hemorrhage                            characterized by adherent blood, congestion                            (  edema), erythema, friability, linear erosions,                            mucus, aphthous ulcerations, deep ulcerations,                            serpentine ulcerations and shallow ulcerations was                            found in the entire examined stomach. Biopsies were                            taken with a cold forceps for Helicobacter pylori                            testing.                           Few non-bleeding cratered duodenal ulcers with no                            stigmata of bleeding were found in the duodenal                            bulb and in the second portion of the duodenum. The                            largest lesion was 6 mm in largest dimension. Complications:            No immediate complications. Estimated Blood Loss:     Estimated blood loss was minimal. Impression:               - Non-bleeding grade I esophageal varices.                           - Gastritis with hemorrhage. Biopsied.                           -  Multiple non-bleeding duodenal ulcers with no                            stigmata of bleeding. Recommendation:           - Patient has a contact number available for                            emergencies. The signs and symptoms of potential                            delayed complications were discussed with the                            patient. Return to normal activities tomorrow.                            Written discharge instructions  were provided to the                            patient.                           - Resume previous diet.                           - Continue present medications.                           -PPI twice daily                           - No aspirin, ibuprofen, naproxen, or other                            non-steroidal anti-inflammatory drugs.                           - Repeat upper endoscopy in 2 years for screening                            purposes.                           - Return to GI clinic in 2 months. Mauri Pole, MD 06/19/2016 3:15:32 PM This report has been signed electronically.

## 2016-06-19 NOTE — Progress Notes (Signed)
Dental advisory given to [place] and oriented x3, pleased with MAC, report to RN Judson Roch

## 2016-06-19 NOTE — Progress Notes (Signed)
Reviewed and agree with documentation and assessment and plan. K. Veena Mir Fullilove , MD   

## 2016-06-19 NOTE — Progress Notes (Signed)
Verified with Dr. Bari Edward that pt. Is to continue with low dose aspirin.

## 2016-06-20 ENCOUNTER — Telehealth: Payer: Self-pay

## 2016-06-20 ENCOUNTER — Telehealth: Payer: Self-pay | Admitting: *Deleted

## 2016-06-20 NOTE — Telephone Encounter (Signed)
No answer, left message to call if questions or concerns.  Second phone call.

## 2016-06-20 NOTE — Telephone Encounter (Signed)
Left message on answering machine. 

## 2016-06-21 DIAGNOSIS — E1165 Type 2 diabetes mellitus with hyperglycemia: Secondary | ICD-10-CM | POA: Diagnosis not present

## 2016-06-25 ENCOUNTER — Encounter: Payer: Self-pay | Admitting: Gastroenterology

## 2016-06-27 ENCOUNTER — Telehealth: Payer: Self-pay | Admitting: Gastroenterology

## 2016-06-27 DIAGNOSIS — Z09 Encounter for follow-up examination after completed treatment for conditions other than malignant neoplasm: Secondary | ICD-10-CM | POA: Diagnosis not present

## 2016-06-27 NOTE — Telephone Encounter (Signed)
The patient is remembering pieces of the post procedure conversation. He wanted to know what staged of cirrhosis he is in. Interested in being tested to find the answer to this. Remembers the words "transplant" being said, but can't remember all of this. I moved his appointment to earlier August. He is very anxious about this. Do you want any further testing prior to his appointment?

## 2016-06-28 NOTE — Telephone Encounter (Signed)
Please refer patient to [facility] to establish care.Please try to bring him in sooner if I have any cancellations. Thanks

## 2016-06-28 NOTE — Telephone Encounter (Signed)
Spoke with the patient. He agrees to this plan. Referral faxed to [facility]

## 2016-07-08 DIAGNOSIS — H2513 Age-related nuclear cataract, bilateral: Secondary | ICD-10-CM | POA: Diagnosis not present

## 2016-07-08 DIAGNOSIS — H52223 Regular astigmatism, bilateral: Secondary | ICD-10-CM | POA: Diagnosis not present

## 2016-07-08 DIAGNOSIS — H524 Presbyopia: Secondary | ICD-10-CM | POA: Diagnosis not present

## 2016-07-08 DIAGNOSIS — H5213 Myopia, bilateral: Secondary | ICD-10-CM | POA: Diagnosis not present

## 2016-07-26 ENCOUNTER — Ambulatory Visit (INDEPENDENT_AMBULATORY_CARE_PROVIDER_SITE_OTHER): Payer: Medicare Other | Admitting: Gastroenterology

## 2016-07-26 ENCOUNTER — Telehealth: Payer: Self-pay | Admitting: Gastroenterology

## 2016-07-26 ENCOUNTER — Encounter: Payer: Self-pay | Admitting: Gastroenterology

## 2016-07-26 ENCOUNTER — Other Ambulatory Visit (INDEPENDENT_AMBULATORY_CARE_PROVIDER_SITE_OTHER): Payer: Medicare Other

## 2016-07-26 VITALS — BP 100/60 | HR 104 | Ht 70.5 in | Wt 248.0 lb

## 2016-07-26 DIAGNOSIS — K7469 Other cirrhosis of liver: Secondary | ICD-10-CM | POA: Diagnosis not present

## 2016-07-26 LAB — CBC WITH DIFFERENTIAL/PLATELET
BASOS PCT: 0.5 % (ref 0.0–3.0)
Basophils Absolute: 0 10*3/uL (ref 0.0–0.1)
EOS PCT: 6.3 % — AB (ref 0.0–5.0)
Eosinophils Absolute: 0.4 10*3/uL (ref 0.0–0.7)
HCT: 44 % (ref 39.0–52.0)
Hemoglobin: 14.9 g/dL (ref 13.0–17.0)
LYMPHS ABS: 1.5 10*3/uL (ref 0.7–4.0)
Lymphocytes Relative: 22.2 % (ref 12.0–46.0)
MCHC: 34 g/dL (ref 30.0–36.0)
MCV: 93.9 fl (ref 78.0–100.0)
MONO ABS: 0.6 10*3/uL (ref 0.1–1.0)
MONOS PCT: 8.5 % (ref 3.0–12.0)
NEUTROS ABS: 4.2 10*3/uL (ref 1.4–7.7)
NEUTROS PCT: 62.5 % (ref 43.0–77.0)
PLATELETS: 234 10*3/uL (ref 150.0–400.0)
RBC: 4.68 Mil/uL (ref 4.22–5.81)
RDW: 13.6 % (ref 11.5–15.5)
WBC: 6.7 10*3/uL (ref 4.0–10.5)

## 2016-07-26 LAB — VITAMIN B12: Vitamin B-12: 323 pg/mL (ref 211–911)

## 2016-07-26 LAB — COMPREHENSIVE METABOLIC PANEL
ALK PHOS: 76 U/L (ref 39–117)
ALT: 40 U/L (ref 0–53)
AST: 29 U/L (ref 0–37)
Albumin: 4.4 g/dL (ref 3.5–5.2)
BUN: 12 mg/dL (ref 6–23)
CHLORIDE: 101 meq/L (ref 96–112)
CO2: 25 mEq/L (ref 19–32)
Calcium: 10.1 mg/dL (ref 8.4–10.5)
Creatinine, Ser: 0.79 mg/dL (ref 0.40–1.50)
GFR: 105.37 mL/min (ref >60.00)
GLUCOSE: 110 mg/dL — AB (ref 70–99)
POTASSIUM: 4.2 meq/L (ref 3.5–5.1)
Sodium: 135 mEq/L (ref 135–145)
TOTAL PROTEIN: 7.6 g/dL (ref 6.0–8.3)
Total Bilirubin: 0.6 mg/dL (ref 0.2–1.2)

## 2016-07-26 LAB — PROTIME-INR
INR: 1.1 ratio — AB (ref 0.8–1.0)
PROTHROMBIN TIME: 12.2 s (ref 9.6–13.1)

## 2016-07-26 LAB — FOLATE

## 2016-07-26 LAB — FERRITIN: Ferritin: 58.6 ng/mL (ref 22.0–322.0)

## 2016-07-26 NOTE — Progress Notes (Signed)
Matthew Lewis    [id number]    October 07, 1955  Primary Care Physician:Ramachandran, [name], MD  Referring Physician: Merrilee Lewis, [name], [name]  Chief complaint:  Cirrhosis  HPI:  61 year old Caucasian male with past history of alcohol abuse, abstinent since 2013, colon cancer status post resection, diabetes here for follow-up visit after recent diagnosis of cirrhosis. Hepatitis B and C negative. Patient denies any lower leg edema or abdominal swelling. No melena or blood per rectum. No confusion or change in mental status. He is trying to lose weight intentionally. Patient and his wife are very concerned about the new diagnosis of cirrhosis. Patient is participating in [place] meeting and actively trying to abstain from alcohol.  EGD June 2018 showed small/trace esophageal varices, small clean based duodenal ulcers and severe erosive gastritis likely secondary to nsaids, H. pylori negative Last colonoscopy in January 2015 by Matthew. Deatra Lewis with removal of 1 diminutive tubular adenoma  Outpatient Encounter Prescriptions as of 07/26/2016  Medication Sig  . acetaminophen (TYLENOL) 325 MG tablet Take 325 mg by mouth every 6 (six) hours as needed for moderate pain.  Marland Kitchen amLODipine (NORVASC) 10 MG tablet Take 10 mg by mouth daily.  Marland Kitchen aspirin EC 81 MG tablet Take 81 mg by mouth daily.  Marland Kitchen atorvastatin (LIPITOR) 40 MG tablet Take 1 tablet (40 mg total) by mouth daily at 6 PM.  . cyclobenzaprine (FLEXERIL) 10 MG tablet Take 10 mg by mouth 3 (three) times daily as needed for muscle spasms.  . empagliflozin (JARDIANCE) 10 MG TABS tablet Take 10 mg by mouth daily.  Marland Kitchen gabapentin (NEURONTIN) 400 MG capsule Take 400 mg by mouth 2 (two) times daily.  Marland Kitchen glipiZIDE (GLUCOTROL) 5 MG tablet Take 5 mg by mouth 2 (two) times daily before a meal.  . HYDROcodone-acetaminophen (NORCO/VICODIN) 5-325 MG tablet Take 1-2 tablets by mouth every 4 (four) hours as  needed for moderate pain.  Marland Kitchen ibuprofen (ADVIL,MOTRIN) 200 MG tablet Take 200 mg by mouth every 6 (six) hours as needed for moderate pain.  . Insulin Detemir (LEVEMIR FLEXPEN) 100 UNIT/ML Pen Inject 10-60 Units into the skin 2 (two) times daily. Take 60 units in the am and Take 10 units in the pm  . lisinopril (PRINIVIL,ZESTRIL) 40 MG tablet Take 40 mg by mouth daily.  . metFORMIN (GLUCOPHAGE) 1000 MG tablet Take 1,000 mg by mouth 2 (two) times daily with a meal.  . Multiple Vitamin (MULTIVITAMIN) tablet Take 1 tablet by mouth daily.  . [DISCONTINUED] ciprofloxacin (CIPRO) 500 MG tablet Take 1 tablet (500 mg total) by mouth 2 (two) times daily.  . [DISCONTINUED] famotidine (PEPCID) 20 MG tablet Take 1 tablet (20 mg total) by mouth 2 (two) times daily.  . [DISCONTINUED] metroNIDAZOLE (FLAGYL) 500 MG tablet Take 1 tablet (500 mg total) by mouth every 8 (eight) hours.  . [DISCONTINUED] pantoprazole (PROTONIX) 40 MG tablet Take 1 tablet (40 mg total) by mouth 2 (two) times daily.   Facility-Administered Encounter Medications as of 07/26/2016  Medication  . 0.9 %  sodium chloride infusion    Allergies as of 07/26/2016 - Review Complete 07/26/2016  Allergen Reaction Noted  . Other Other (See Comments) 05/15/2012    Past Medical History:  Diagnosis Date  . Abscess of anal and rectal regions    horse shoe abscess  . Arthritis    "everywhere; especially in my spine"  . Cirrhosis ([facility])   .  Colon cancer ([facility]) 05/2005  . DM type 2 with diabetic peripheral neuropathy (HCC)    left foot  . Fatigue   . Fistula, anal    multiple  . History of substance abuse    last alcohol was 08/2011; last marijuania was 08/2011  . Poor circulation   . PTSD (post-traumatic stress disorder)    "severe"  . Sleep deprivation   . Type II diabetes mellitus ([facility])     Past Surgical History:  Procedure Laterality Date  . ABSCESS DRAINAGE     "probably 15-20 so far at least; rectal"  . ANAL FISTULECTOMY  12/13/1998   . ANTERIOR FUSION CERVICAL SPINE  2010   "triple"; "did 2 surgeries on the same day in 8101 due to complications"  . CORONARY ANGIOPLASTY  2001  . detached muscle  2010   right chest; "after cervical fusion complications"  . ELBOW SURGERY  2007   "cut out part of a muscle"; left  . EXAMINATION UNDER ANESTHESIA  03/22/2005   fistula  . EXAMINATION UNDER ANESTHESIA  06/06/2011   Procedure: EXAM UNDER ANESTHESIA;  Surgeon: Matthew Klein, MD;  Location: [facility];  Service: General;  Laterality: N/A;  . HEMICOLECTOMY  06/06/2005   left  . INCISION AND DRAINAGE PERIRECTAL ABSCESS  03/30/2005  . INCISION AND DRAINAGE PERIRECTAL ABSCESS  12/13/2004  . INCISION AND DRAINAGE PERIRECTAL ABSCESS  07/14/2001  . INCISION AND DRAINAGE PERIRECTAL ABSCESS  08/12/2010   horseshoe abscess; Matthew Lewis  . INCISION AND DRAINAGE PERIRECTAL ABSCESS  06/06/2011   Procedure: IRRIGATION AND DEBRIDEMENT PERIRECTAL ABSCESS;  Surgeon: Matthew Klein, MD;  Location: [facility];  Service: General;  Laterality: N/A;  . LUMBAR [place]  . PERCUTANEOUS PINNING PHALANX FRACTURE OF HAND  ~ 2008   "plates in 2 places"; right  . THORACIC DISCECTOMY  1990's    Family History  Problem Relation Age of Onset  . Heart disease Father   . Heart disease Mother   . Cancer Mother   . Cancer Sister   . Heart disease Brother     Social History   Social History  . Marital status: Married    Spouse name: N/A  . Number of children: N/A  . Years of education: N/A   Occupational History  . Not on file.   Social History Main Topics  . Smoking status: Former Smoker    Packs/day: 1.00    Years: 10.00    Types: Cigarettes    Quit date: 01/08/1991  . Smokeless tobacco: Never Used  . Alcohol use Yes     Comment: 06/05/11 "drank enough when I was younger to last me my whole life; don't remember when I had my last drink, maybe 2012"  . Drug use: Yes    Types: Oxycodone, Marijuana     Comment: 06/05/11 "pot head in my younger days; 2009  Matthew had me on oxycodone and valium for almost 30yr"  . Sexual activity: Not Currently   Other Topics Concern  . Not on file   Social History Narrative  . No narrative on file      Review of systems: Review of Systems  Constitutional: Negative for fever and chills.  HENT: Negative.   Eyes: Negative for blurred vision.  Respiratory: Negative for cough, shortness of breath and wheezing.   Cardiovascular: Negative for chest pain and palpitations.  Gastrointestinal: as per HPI Genitourinary: Negative for dysuria, urgency, frequency and hematuria.  Musculoskeletal: Negative for myalgias, back pain and joint  pain.  Skin: Negative for itching and rash.  Neurological: Negative for dizziness, tremors, focal weakness, seizures and loss of consciousness.  Endo/Heme/Allergies: Positive for seasonal allergies.  Psychiatric/Behavioral: Negative for depression, suicidal ideas and hallucinations.  All other systems reviewed and are negative.   Physical Exam: Vitals:   07/26/16 0848  BP: 100/60  Pulse: (!) 104   Body mass index is 35.08 kg/m. Gen:      No acute distress HEENT:  EOMI, sclera anicteric Neck:     No masses; no thyromegaly Lungs:    Clear to auscultation bilaterally; normal respiratory effort CV:         Regular rate and rhythm; no murmurs Abd:      + bowel sounds; soft, non-tender; no palpable masses, no distension Ext:    No edema; adequate peripheral perfusion Skin:      Warm and dry; no rash Neuro: alert and oriented x 3 Psych: normal mood and affect  Data Reviewed:  Reviewed labs, radiology imaging, old records and pertinent past GI work up   Assessment and Plan/Recommendations: 61 year old male with diabetes, obesity, colon cancer status post resection, alcohol abuse abstinent since 2013, recent diagnosis of cirrhosis here for follow-up visit Patient tells child Pugh classification A and has low meld 6 He has early cirrhosis and no evidence of  decompensation Likely etiology of cirrhosis is history of alcohol abuse and possible [name] Endo reassured patient and discussed in detail the diagnosis, prognosis and management Check ANA, AMA, alpha-1 antitrypsin, anti-smooth antibody, ceruloplasmin to exclude any other underlying liver disease Follow-up CBC, CMP, PT/INR and AFP He is scheduled for a visit at [facility], okay to cancel the appointment as patient has a very low meld and likely may not need liver transplant evaluation anytime in the near future  Erosive gastritis and peptic ulcer disease: Continue to avoid nsaids  25 minutes was spent face-to-face with the patient. Greater than 50% of the time used for counseling as well as treatment plan and follow-up. She had multiple questions which were answered to her satisfaction  K. Denzil Magnuson , MD [phone] Mon-Fri 8a-5p [phone] after 5p, weekends, holidays  CC: Matthew Seashore, MD

## 2016-07-26 NOTE — Telephone Encounter (Signed)
Dr Silverio Decamp did you advise the pt to cancel appt at [facility] liver care?  He cancelled and they want to confirm that you are aware.

## 2016-07-26 NOTE — Telephone Encounter (Signed)
[facility] has been advised

## 2016-07-26 NOTE — Telephone Encounter (Signed)
Yes, I asked him to cancel as I saw him in office today and didn't think he will need liver clinc appt . Thanks

## 2016-07-26 NOTE — Patient Instructions (Signed)
Go to the basement for labs today Follow up in 6 months 

## 2016-07-29 LAB — AFP TUMOR MARKER: AFP TUMOR MARKER: 2.2 ng/mL (ref <6.1)

## 2016-07-29 LAB — MITOCHONDRIAL ANTIBODIES

## 2016-07-29 LAB — CERULOPLASMIN: Ceruloplasmin: 27 mg/dL (ref 18–36)

## 2016-07-29 LAB — ANTI-SMOOTH MUSCLE ANTIBODY, IGG: Smooth Muscle Ab: <20 U (ref <20)

## 2016-07-29 LAB — ALPHA-1-ANTITRYPSIN: A1 ANTITRYPSIN SER: 152 mg/dL (ref 83–199)

## 2016-07-29 LAB — ANA: ANA: NEGATIVE

## 2016-08-05 ENCOUNTER — Other Ambulatory Visit: Payer: Self-pay

## 2016-08-05 DIAGNOSIS — R945 Abnormal results of liver function studies: Principal | ICD-10-CM

## 2016-08-05 DIAGNOSIS — R7989 Other specified abnormal findings of blood chemistry: Secondary | ICD-10-CM

## 2016-08-13 DIAGNOSIS — H40023 Open angle with borderline findings, high risk, bilateral: Secondary | ICD-10-CM | POA: Diagnosis not present

## 2016-08-15 ENCOUNTER — Ambulatory Visit: Payer: Medicare Other | Admitting: Gastroenterology

## 2016-08-26 ENCOUNTER — Ambulatory Visit: Payer: Medicare Other | Admitting: Gastroenterology

## 2016-09-27 DIAGNOSIS — H40033 Anatomical narrow angle, bilateral: Secondary | ICD-10-CM | POA: Diagnosis not present

## 2016-10-11 DIAGNOSIS — H40031 Anatomical narrow angle, right eye: Secondary | ICD-10-CM | POA: Diagnosis not present

## 2016-10-25 DIAGNOSIS — E1165 Type 2 diabetes mellitus with hyperglycemia: Secondary | ICD-10-CM | POA: Diagnosis not present

## 2016-10-25 DIAGNOSIS — E782 Mixed hyperlipidemia: Secondary | ICD-10-CM | POA: Diagnosis not present

## 2016-10-25 DIAGNOSIS — I2510 Atherosclerotic heart disease of native coronary artery without angina pectoris: Secondary | ICD-10-CM | POA: Diagnosis not present

## 2016-11-06 DIAGNOSIS — K7460 Unspecified cirrhosis of liver: Secondary | ICD-10-CM | POA: Diagnosis not present

## 2016-11-06 DIAGNOSIS — E1165 Type 2 diabetes mellitus with hyperglycemia: Secondary | ICD-10-CM | POA: Diagnosis not present

## 2016-11-06 DIAGNOSIS — E1140 Type 2 diabetes mellitus with diabetic neuropathy, unspecified: Secondary | ICD-10-CM | POA: Diagnosis not present

## 2016-11-06 DIAGNOSIS — Z23 Encounter for immunization: Secondary | ICD-10-CM | POA: Diagnosis not present

## 2016-11-06 DIAGNOSIS — I2510 Atherosclerotic heart disease of native coronary artery without angina pectoris: Secondary | ICD-10-CM | POA: Diagnosis not present

## 2016-11-06 DIAGNOSIS — E782 Mixed hyperlipidemia: Secondary | ICD-10-CM | POA: Diagnosis not present

## 2016-11-15 DIAGNOSIS — H40003 Preglaucoma, unspecified, bilateral: Secondary | ICD-10-CM | POA: Diagnosis not present

## 2017-01-08 ENCOUNTER — Other Ambulatory Visit: Payer: Medicare Other

## 2017-02-04 ENCOUNTER — Other Ambulatory Visit (INDEPENDENT_AMBULATORY_CARE_PROVIDER_SITE_OTHER): Payer: Medicare Other

## 2017-02-04 DIAGNOSIS — R7989 Other specified abnormal findings of blood chemistry: Secondary | ICD-10-CM

## 2017-02-04 DIAGNOSIS — R945 Abnormal results of liver function studies: Secondary | ICD-10-CM | POA: Diagnosis not present

## 2017-02-04 LAB — COMPREHENSIVE METABOLIC PANEL
ALK PHOS: 73 U/L (ref 39–117)
ALT: 36 U/L (ref 0–53)
AST: 27 U/L (ref 0–37)
Albumin: 4.6 g/dL (ref 3.5–5.2)
BUN: 17 mg/dL (ref 6–23)
CO2: 22 meq/L (ref 19–32)
Calcium: 10 mg/dL (ref 8.4–10.5)
Chloride: 99 mEq/L (ref 96–112)
Creatinine, Ser: 0.76 mg/dL (ref 0.40–1.50)
GFR: 109.99 mL/min (ref >60.00)
GLUCOSE: 147 mg/dL — AB (ref 70–99)
POTASSIUM: 4.4 meq/L (ref 3.5–5.1)
Sodium: 132 mEq/L — ABNORMAL LOW (ref 135–145)
Total Bilirubin: 0.7 mg/dL (ref 0.2–1.2)
Total Protein: 8.3 g/dL (ref 6.0–8.3)

## 2017-02-10 ENCOUNTER — Encounter: Payer: Self-pay | Admitting: Gastroenterology

## 2017-02-24 ENCOUNTER — Encounter (HOSPITAL_COMMUNITY): Payer: Self-pay

## 2017-02-24 ENCOUNTER — Inpatient Hospital Stay (HOSPITAL_COMMUNITY)
Admission: EM | Admit: 2017-02-24 | Discharge: 2017-02-25 | DRG: 872 | Disposition: A | Discharge status: Home Health | Payer: Medicare Other | Attending: Internal Medicine | Admitting: Internal Medicine

## 2017-02-24 ENCOUNTER — Other Ambulatory Visit: Payer: Self-pay

## 2017-02-24 ENCOUNTER — Inpatient Hospital Stay (HOSPITAL_COMMUNITY): Payer: Medicare Other | Admitting: Certified Registered Nurse Anesthetist

## 2017-02-24 ENCOUNTER — Encounter (HOSPITAL_COMMUNITY)
Admission: EM | Disposition: A | Discharge status: Home Health | Payer: Self-pay | Source: Home / Self Care | Attending: Internal Medicine

## 2017-02-24 ENCOUNTER — Emergency Department (HOSPITAL_COMMUNITY): Payer: Medicare Other

## 2017-02-24 DIAGNOSIS — E440 Moderate protein-calorie malnutrition: Secondary | ICD-10-CM | POA: Diagnosis not present

## 2017-02-24 DIAGNOSIS — K611 Rectal abscess: Secondary | ICD-10-CM | POA: Diagnosis present

## 2017-02-24 DIAGNOSIS — Z85038 Personal history of other malignant neoplasm of large intestine: Secondary | ICD-10-CM | POA: Diagnosis not present

## 2017-02-24 DIAGNOSIS — Z79899 Other long term (current) drug therapy: Secondary | ICD-10-CM | POA: Diagnosis not present

## 2017-02-24 DIAGNOSIS — K7030 Alcoholic cirrhosis of liver without ascites: Secondary | ICD-10-CM | POA: Diagnosis present

## 2017-02-24 DIAGNOSIS — R911 Solitary pulmonary nodule: Secondary | ICD-10-CM | POA: Diagnosis not present

## 2017-02-24 DIAGNOSIS — R110 Nausea: Secondary | ICD-10-CM | POA: Diagnosis not present

## 2017-02-24 DIAGNOSIS — K6131 Horseshoe abscess: Secondary | ICD-10-CM | POA: Diagnosis present

## 2017-02-24 DIAGNOSIS — Z794 Long term (current) use of insulin: Secondary | ICD-10-CM

## 2017-02-24 DIAGNOSIS — F4310 Post-traumatic stress disorder, unspecified: Secondary | ICD-10-CM | POA: Diagnosis present

## 2017-02-24 DIAGNOSIS — R509 Fever, unspecified: Secondary | ICD-10-CM | POA: Diagnosis not present

## 2017-02-24 DIAGNOSIS — Z9049 Acquired absence of other specified parts of digestive tract: Secondary | ICD-10-CM

## 2017-02-24 DIAGNOSIS — E119 Type 2 diabetes mellitus without complications: Secondary | ICD-10-CM | POA: Diagnosis not present

## 2017-02-24 DIAGNOSIS — A419 Sepsis, unspecified organism: Secondary | ICD-10-CM | POA: Diagnosis not present

## 2017-02-24 DIAGNOSIS — Z66 Do not resuscitate: Secondary | ICD-10-CM | POA: Diagnosis present

## 2017-02-24 DIAGNOSIS — I10 Essential (primary) hypertension: Secondary | ICD-10-CM | POA: Diagnosis present

## 2017-02-24 DIAGNOSIS — E871 Hypo-osmolality and hyponatremia: Secondary | ICD-10-CM | POA: Diagnosis not present

## 2017-02-24 DIAGNOSIS — Z7982 Long term (current) use of aspirin: Secondary | ICD-10-CM

## 2017-02-24 DIAGNOSIS — K5900 Constipation, unspecified: Secondary | ICD-10-CM | POA: Diagnosis not present

## 2017-02-24 DIAGNOSIS — I2510 Atherosclerotic heart disease of native coronary artery without angina pectoris: Secondary | ICD-10-CM | POA: Diagnosis not present

## 2017-02-24 DIAGNOSIS — E1142 Type 2 diabetes mellitus with diabetic polyneuropathy: Secondary | ICD-10-CM | POA: Diagnosis present

## 2017-02-24 DIAGNOSIS — Z9861 Coronary angioplasty status: Secondary | ICD-10-CM

## 2017-02-24 DIAGNOSIS — Z87891 Personal history of nicotine dependence: Secondary | ICD-10-CM | POA: Diagnosis not present

## 2017-02-24 DIAGNOSIS — Z6835 Body mass index (BMI) 35.0-35.9, adult: Secondary | ICD-10-CM | POA: Diagnosis not present

## 2017-02-24 DIAGNOSIS — N200 Calculus of kidney: Secondary | ICD-10-CM | POA: Diagnosis not present

## 2017-02-24 HISTORY — DX: Alcoholic cirrhosis of liver without ascites: K70.30

## 2017-02-24 HISTORY — PX: INCISION AND DRAINAGE PERIRECTAL ABSCESS: SHX1804

## 2017-02-24 LAB — COMPREHENSIVE METABOLIC PANEL
ALK PHOS: 100 U/L (ref 38–126)
ALT: 31 U/L (ref 17–63)
ANION GAP: 14 (ref 5–15)
AST: 30 U/L (ref 15–41)
Albumin: 3.1 g/dL — ABNORMAL LOW (ref 3.5–5.0)
BILIRUBIN TOTAL: 1.2 mg/dL (ref 0.3–1.2)
BUN: 12 mg/dL (ref 6–20)
CALCIUM: 9 mg/dL (ref 8.9–10.3)
CO2: 18 mmol/L — AB (ref 22–32)
CREATININE: 0.73 mg/dL (ref 0.61–1.24)
Chloride: 99 mmol/L — ABNORMAL LOW (ref 101–111)
Glucose, Bld: 166 mg/dL — ABNORMAL HIGH (ref 65–99)
Potassium: 4.2 mmol/L (ref 3.5–5.1)
Sodium: 131 mmol/L — ABNORMAL LOW (ref 135–145)
TOTAL PROTEIN: 7.4 g/dL (ref 6.5–8.1)

## 2017-02-24 LAB — CBC WITH DIFFERENTIAL/PLATELET
BASOS ABS: 0 10*3/uL (ref 0.0–0.1)
Basophils Relative: 0 %
EOS ABS: 0 10*3/uL (ref 0.0–0.7)
Eosinophils Relative: 0 %
HCT: 40.4 % (ref 39.0–52.0)
Hemoglobin: 14.1 g/dL (ref 13.0–17.0)
LYMPHS PCT: 13 %
Lymphs Abs: 1.8 10*3/uL (ref 0.7–4.0)
MCH: 31.9 pg (ref 26.0–34.0)
MCHC: 34.9 g/dL (ref 30.0–36.0)
MCV: 91.4 fL (ref 78.0–100.0)
MONO ABS: 0.7 10*3/uL (ref 0.1–1.0)
Monocytes Relative: 5 %
NEUTROS PCT: 82 %
Neutro Abs: 11.6 10*3/uL — ABNORMAL HIGH (ref 1.7–7.7)
PLATELETS: 203 10*3/uL (ref 150–400)
RBC: 4.42 MIL/uL (ref 4.22–5.81)
RDW: 13.1 % (ref 11.5–15.5)
WBC: 14.1 10*3/uL — AB (ref 4.0–10.5)

## 2017-02-24 LAB — URINALYSIS, ROUTINE W REFLEX MICROSCOPIC
BILIRUBIN URINE: NEGATIVE
Bacteria, UA: NONE SEEN
HGB URINE DIPSTICK: NEGATIVE
Ketones, ur: 20 mg/dL — AB
Leukocytes, UA: NEGATIVE
NITRITE: NEGATIVE
PROTEIN: NEGATIVE mg/dL
SPECIFIC GRAVITY, URINE: 1.037 — AB (ref 1.005–1.030)
Squamous Epithelial / LPF: NONE SEEN
pH: 6 (ref 5.0–8.0)

## 2017-02-24 LAB — POC OCCULT BLOOD, ED: Fecal Occult Bld: NEGATIVE

## 2017-02-24 LAB — LACTIC ACID, PLASMA
Lactic Acid, Venous: 1.3 mmol/L (ref 0.5–1.9)
Lactic Acid, Venous: 1.1 mmol/L (ref 0.5–1.9)

## 2017-02-24 LAB — I-STAT CG4 LACTIC ACID, ED
Lactic Acid, Venous: 2.26 mmol/L (ref 0.5–1.9)
Lactic Acid, Venous: 1.25 mmol/L (ref 0.5–1.9)

## 2017-02-24 LAB — GLUCOSE, CAPILLARY
GLUCOSE-CAPILLARY: 81 mg/dL (ref 65–99)
GLUCOSE-CAPILLARY: 78 mg/dL (ref 65–99)
Glucose-Capillary: 139 mg/dL — ABNORMAL HIGH (ref 65–99)

## 2017-02-24 LAB — HEMOGLOBIN A1C
HEMOGLOBIN A1C: 6.6 % — AB (ref 4.8–5.6)
Mean Plasma Glucose: 142.72 mg/dL

## 2017-02-24 SURGERY — INCISION AND DRAINAGE, ABSCESS, PERIRECTAL
Anesthesia: General

## 2017-02-24 MED ORDER — MORPHINE SULFATE (PF) 4 MG/ML IV SOLN: 2.0000 mg | INTRAVENOUS | Status: DC | PRN

## 2017-02-24 MED ORDER — PROMETHAZINE HCL 25 MG/ML IJ SOLN: 6.2500 mg | INTRAMUSCULAR | Status: DC | PRN

## 2017-02-24 MED ORDER — HYDROMORPHONE HCL 1 MG/ML IJ SOLN: 1.0000 mg | INTRAMUSCULAR | Status: DC | PRN

## 2017-02-24 MED ORDER — KETOROLAC TROMETHAMINE 30 MG/ML IJ SOLN: 30.0000 mg | Freq: Four times a day (QID) | INTRAMUSCULAR | Status: DC | PRN

## 2017-02-24 MED ORDER — ONDANSETRON HCL 4 MG/2ML IJ SOLN
INTRAMUSCULAR | Status: DC | PRN
  Administered 2017-02-24: 4 mg via INTRAVENOUS

## 2017-02-24 MED ORDER — PHENYLEPHRINE 40 MCG/ML (10ML) SYRINGE FOR IV PUSH (FOR BLOOD PRESSURE SUPPORT)
PREFILLED_SYRINGE | INTRAVENOUS | Status: AC
  Filled 2017-02-24: qty 10

## 2017-02-24 MED ORDER — ACETAMINOPHEN 325 MG PO TABS: 650.0000 mg | ORAL_TABLET | Freq: Four times a day (QID) | ORAL | Status: DC | PRN

## 2017-02-24 MED ORDER — ONDANSETRON HCL 4 MG/2ML IJ SOLN: 4.0000 mg | Freq: Four times a day (QID) | INTRAMUSCULAR | Status: DC | PRN

## 2017-02-24 MED ORDER — SUCCINYLCHOLINE CHLORIDE 200 MG/10ML IV SOSY
PREFILLED_SYRINGE | INTRAVENOUS | Status: DC | PRN
  Administered 2017-02-24: 140 mg via INTRAVENOUS

## 2017-02-24 MED ORDER — OXYCODONE-ACETAMINOPHEN 5-325 MG PO TABS: 1.0000 | ORAL_TABLET | ORAL | Status: DC | PRN

## 2017-02-24 MED ORDER — CLINDAMYCIN PHOSPHATE 600 MG/50ML IV SOLN
600.0000 mg | Freq: Once | INTRAVENOUS | Status: AC
  Administered 2017-02-24: 600 mg via INTRAVENOUS
  Filled 2017-02-24: qty 50

## 2017-02-24 MED ORDER — ASPIRIN EC 81 MG PO TBEC
81.0000 mg | DELAYED_RELEASE_TABLET | Freq: Every day | ORAL | Status: DC
  Administered 2017-02-25: 81 mg via ORAL
  Filled 2017-02-24: qty 1

## 2017-02-24 MED ORDER — SODIUM CHLORIDE 0.9 % IV BOLUS (SEPSIS)
1000.0000 mL | Freq: Once | INTRAVENOUS | Status: AC
  Administered 2017-02-24: 1000 mL via INTRAVENOUS

## 2017-02-24 MED ORDER — FENTANYL CITRATE (PF) 100 MCG/2ML IJ SOLN
INTRAMUSCULAR | Status: DC | PRN
  Administered 2017-02-24: 50 ug via INTRAVENOUS
  Administered 2017-02-24: 100 ug via INTRAVENOUS

## 2017-02-24 MED ORDER — LIDOCAINE 2% (20 MG/ML) 5 ML SYRINGE
INTRAMUSCULAR | Status: AC
  Filled 2017-02-24: qty 5

## 2017-02-24 MED ORDER — INSULIN DETEMIR 100 UNIT/ML ~~LOC~~ SOLN
10.0000 [IU] | Freq: Every day | SUBCUTANEOUS | Status: DC
  Administered 2017-02-24: 10 [IU] via SUBCUTANEOUS
  Filled 2017-02-24: qty 0.1

## 2017-02-24 MED ORDER — DULOXETINE HCL 30 MG PO CPEP
30.0000 mg | ORAL_CAPSULE | Freq: Two times a day (BID) | ORAL | Status: DC
  Administered 2017-02-24 – 2017-02-25 (×2): 30 mg via ORAL
  Filled 2017-02-24 (×2): qty 1

## 2017-02-24 MED ORDER — INSULIN DETEMIR 100 UNIT/ML ~~LOC~~ SOLN
60.0000 [IU] | Freq: Every day | SUBCUTANEOUS | Status: DC
  Administered 2017-02-25: 60 [IU] via SUBCUTANEOUS
  Filled 2017-02-24 (×2): qty 0.6

## 2017-02-24 MED ORDER — ONDANSETRON HCL 4 MG PO TABS: 4.0000 mg | ORAL_TABLET | Freq: Four times a day (QID) | ORAL | Status: DC | PRN

## 2017-02-24 MED ORDER — LACTATED RINGERS IV SOLN
INTRAVENOUS | Status: DC
  Administered 2017-02-24: 17:00:00 via INTRAVENOUS

## 2017-02-24 MED ORDER — PROPOFOL 10 MG/ML IV BOLUS
INTRAVENOUS | Status: DC | PRN
  Administered 2017-02-24: 120 mg via INTRAVENOUS

## 2017-02-24 MED ORDER — MIDAZOLAM HCL 5 MG/5ML IJ SOLN
INTRAMUSCULAR | Status: DC | PRN
  Administered 2017-02-24: 2 mg via INTRAVENOUS

## 2017-02-24 MED ORDER — ROCURONIUM BROMIDE 10 MG/ML (PF) SYRINGE
PREFILLED_SYRINGE | INTRAVENOUS | Status: DC | PRN
  Administered 2017-02-24: 30 mg via INTRAVENOUS

## 2017-02-24 MED ORDER — FENTANYL CITRATE (PF) 250 MCG/5ML IJ SOLN
INTRAMUSCULAR | Status: AC
  Filled 2017-02-24: qty 5

## 2017-02-24 MED ORDER — CYCLOBENZAPRINE HCL 10 MG PO TABS: 10.0000 mg | ORAL_TABLET | Freq: Three times a day (TID) | ORAL | Status: DC | PRN

## 2017-02-24 MED ORDER — HYDROMORPHONE HCL 1 MG/ML IJ SOLN
0.5000 mg | Freq: Once | INTRAMUSCULAR | Status: AC
  Administered 2017-02-24: 0.5 mg via INTRAVENOUS
  Filled 2017-02-24: qty 1

## 2017-02-24 MED ORDER — ACETAMINOPHEN 650 MG RE SUPP: 650.0000 mg | Freq: Four times a day (QID) | RECTAL | Status: DC | PRN

## 2017-02-24 MED ORDER — IOPAMIDOL (ISOVUE-300) INJECTION 61%
INTRAVENOUS | Status: AC
  Administered 2017-02-24: 100 mL
  Filled 2017-02-24: qty 100

## 2017-02-24 MED ORDER — SUGAMMADEX SODIUM 200 MG/2ML IV SOLN
INTRAVENOUS | Status: AC
  Filled 2017-02-24: qty 2

## 2017-02-24 MED ORDER — DEXAMETHASONE SODIUM PHOSPHATE 10 MG/ML IJ SOLN
INTRAMUSCULAR | Status: AC
  Filled 2017-02-24: qty 1

## 2017-02-24 MED ORDER — MEPERIDINE HCL 50 MG/ML IJ SOLN: 6.2500 mg | INTRAMUSCULAR | Status: DC | PRN

## 2017-02-24 MED ORDER — OXYCODONE HCL 5 MG PO TABS: 5.0000 mg | ORAL_TABLET | ORAL | Status: DC | PRN

## 2017-02-24 MED ORDER — LISINOPRIL 40 MG PO TABS
40.0000 mg | ORAL_TABLET | Freq: Every day | ORAL | Status: DC
  Administered 2017-02-25: 40 mg via ORAL
  Filled 2017-02-24: qty 1

## 2017-02-24 MED ORDER — PIPERACILLIN-TAZOBACTAM 3.375 G IVPB
3.3750 g | Freq: Three times a day (TID) | INTRAVENOUS | Status: DC
  Administered 2017-02-24 – 2017-02-25 (×3): 3.375 g via INTRAVENOUS
  Filled 2017-02-24 (×4): qty 50

## 2017-02-24 MED ORDER — SUGAMMADEX SODIUM 200 MG/2ML IV SOLN
INTRAVENOUS | Status: DC | PRN
  Administered 2017-02-24: 200 mg via INTRAVENOUS

## 2017-02-24 MED ORDER — 0.9 % SODIUM CHLORIDE (POUR BTL) OPTIME
TOPICAL | Status: DC | PRN
  Administered 2017-02-24: 1000 mL

## 2017-02-24 MED ORDER — AMLODIPINE BESYLATE 10 MG PO TABS
10.0000 mg | ORAL_TABLET | Freq: Every day | ORAL | Status: DC
  Filled 2017-02-24: qty 1

## 2017-02-24 MED ORDER — PHENYLEPHRINE 40 MCG/ML (10ML) SYRINGE FOR IV PUSH (FOR BLOOD PRESSURE SUPPORT)
PREFILLED_SYRINGE | INTRAVENOUS | Status: DC | PRN
  Administered 2017-02-24 (×5): 80 ug via INTRAVENOUS

## 2017-02-24 MED ORDER — ATORVASTATIN CALCIUM 40 MG PO TABS: 40.0000 mg | ORAL_TABLET | Freq: Every day | ORAL | Status: DC

## 2017-02-24 MED ORDER — GABAPENTIN 400 MG PO CAPS
400.0000 mg | ORAL_CAPSULE | Freq: Two times a day (BID) | ORAL | Status: DC
  Administered 2017-02-24 – 2017-02-25 (×2): 400 mg via ORAL
  Filled 2017-02-24 (×2): qty 1

## 2017-02-24 MED ORDER — MIDAZOLAM HCL 2 MG/2ML IJ SOLN
INTRAMUSCULAR | Status: AC
  Filled 2017-02-24: qty 2

## 2017-02-24 MED ORDER — DEXAMETHASONE SODIUM PHOSPHATE 4 MG/ML IJ SOLN
INTRAMUSCULAR | Status: DC | PRN
  Administered 2017-02-24: 10 mg via INTRAVENOUS

## 2017-02-24 MED ORDER — SUCCINYLCHOLINE CHLORIDE 200 MG/10ML IV SOSY
PREFILLED_SYRINGE | INTRAVENOUS | Status: AC
  Filled 2017-02-24: qty 10

## 2017-02-24 MED ORDER — FENTANYL CITRATE (PF) 100 MCG/2ML IJ SOLN: 25.0000 ug | INTRAMUSCULAR | Status: DC | PRN

## 2017-02-24 MED ORDER — MIDAZOLAM HCL 2 MG/2ML IJ SOLN: 0.5000 mg | Freq: Once | INTRAMUSCULAR | Status: DC | PRN

## 2017-02-24 MED ORDER — INSULIN ASPART 100 UNIT/ML ~~LOC~~ SOLN
0.0000 [IU] | Freq: Three times a day (TID) | SUBCUTANEOUS | Status: DC
  Administered 2017-02-25 (×2): 2 [IU] via SUBCUTANEOUS

## 2017-02-24 MED ORDER — INSULIN ASPART 100 UNIT/ML ~~LOC~~ SOLN: 0.0000 [IU] | Freq: Every day | SUBCUTANEOUS | Status: DC

## 2017-02-24 MED ORDER — ONDANSETRON HCL 4 MG/2ML IJ SOLN
INTRAMUSCULAR | Status: AC
  Filled 2017-02-24: qty 2

## 2017-02-24 MED ORDER — DOCUSATE SODIUM 100 MG PO CAPS
100.0000 mg | ORAL_CAPSULE | Freq: Two times a day (BID) | ORAL | Status: DC
  Administered 2017-02-24 – 2017-02-25 (×2): 100 mg via ORAL
  Filled 2017-02-24 (×2): qty 1

## 2017-02-24 MED ORDER — ROCURONIUM BROMIDE 10 MG/ML (PF) SYRINGE
PREFILLED_SYRINGE | INTRAVENOUS | Status: AC
  Filled 2017-02-24: qty 5

## 2017-02-24 MED ORDER — LIDOCAINE 2% (20 MG/ML) 5 ML SYRINGE
INTRAMUSCULAR | Status: DC | PRN
  Administered 2017-02-24: 20 mg via INTRAVENOUS

## 2017-02-24 SURGICAL SUPPLY — 32 items
CANISTER SUCT 3000ML PPV (MISCELLANEOUS) ×3 IMPLANT
CLEANER TIP ELECTROSURG 2X2 (MISCELLANEOUS) ×3 IMPLANT
COVER SURGICAL LIGHT HANDLE (MISCELLANEOUS) ×3 IMPLANT
DRAIN PENROSE 1X12 LTX STRL (DRAIN) ×3 IMPLANT
DRSG PAD ABDOMINAL 8X10 ST (GAUZE/BANDAGES/DRESSINGS) ×3 IMPLANT
ELECT REM PT RETURN 9FT ADLT (ELECTROSURGICAL) ×3
ELECTRODE REM PT RTRN 9FT ADLT (ELECTROSURGICAL) ×1 IMPLANT
GAUZE SPONGE 4X4 12PLY STRL (GAUZE/BANDAGES/DRESSINGS) ×3 IMPLANT
GAUZE SPONGE 4X4 12PLY STRL LF (GAUZE/BANDAGES/DRESSINGS) ×3 IMPLANT
GAUZE SPONGE 4X4 16PLY XRAY LF (GAUZE/BANDAGES/DRESSINGS) ×3 IMPLANT
GLOVE BIOGEL PI IND STRL 8 (GLOVE) ×1 IMPLANT
GLOVE BIOGEL PI INDICATOR 8 (GLOVE) ×2
GLOVE ECLIPSE 7.5 STRL STRAW (GLOVE) ×3 IMPLANT
GOWN STRL REUS W/ TWL LRG LVL3 (GOWN DISPOSABLE) ×2 IMPLANT
GOWN STRL REUS W/TWL LRG LVL3 (GOWN DISPOSABLE) ×6
KIT BASIN OR (CUSTOM PROCEDURE TRAY) ×3 IMPLANT
KIT ROOM TURNOVER OR (KITS) ×3 IMPLANT
NS IRRIG 1000ML POUR BTL (IV SOLUTION) ×3 IMPLANT
PACK LITHOTOMY IV (CUSTOM PROCEDURE TRAY) ×3 IMPLANT
PAD ABD 8X10 STRL (GAUZE/BANDAGES/DRESSINGS) ×3 IMPLANT
PAD ARMBOARD 7.5X6 YLW CONV (MISCELLANEOUS) ×3 IMPLANT
PENCIL BUTTON HOLSTER BLD 10FT (ELECTRODE) ×3 IMPLANT
SURGILUBE 2OZ TUBE FLIPTOP (MISCELLANEOUS) ×3 IMPLANT
SUT ETHILON 2 0 FS 18 (SUTURE) ×3 IMPLANT
SWAB COLLECTION DEVICE MRSA (MISCELLANEOUS) ×3 IMPLANT
SWAB CULTURE ESWAB REG 1ML (MISCELLANEOUS) IMPLANT
TOWEL OR 17X24 6PK STRL BLUE (TOWEL DISPOSABLE) ×3 IMPLANT
TOWEL OR 17X26 10 PK STRL BLUE (TOWEL DISPOSABLE) ×3 IMPLANT
TUBE CONNECTING 12'X1/4 (SUCTIONS) ×1
TUBE CONNECTING 12X1/4 (SUCTIONS) ×2 IMPLANT
UNDERPAD 30X30 (UNDERPADS AND DIAPERS) ×6 IMPLANT
YANKAUER SUCT BULB TIP NO VENT (SUCTIONS) ×3 IMPLANT

## 2017-02-24 NOTE — ED Notes (Addendum)
Pt rectal fistula bursted and is draining, general surgery called and states to put a ABD pad to catch drainage.

## 2017-02-24 NOTE — ED Notes (Signed)
Consent signed and placed at pt bedside.

## 2017-02-24 NOTE — Progress Notes (Signed)
Pt refuses to wear SCD's-says, "I'll take them off". Protocol & rational explained to pt-he refuses them.

## 2017-02-24 NOTE — Anesthesia Procedure Notes (Signed)
Procedure Name: Intubation Date/Time: 02/24/2017 5:44 PM Performed by: Candis Shine, CRNA Pre-anesthesia Checklist: Patient identified, Emergency Drugs available, Suction available and Patient being monitored Patient Re-evaluated:Patient Re-evaluated prior to induction Oxygen Delivery Method: Circle System Utilized Preoxygenation: Pre-oxygenation with 100% oxygen Induction Type: IV induction Laryngoscope Size: Mac and 3 Grade View: Grade I Tube type: Oral Tube size: 7.5 mm Number of attempts: 1 Airway Equipment and Method: Stylet Placement Confirmation: ETT inserted through vocal cords under direct vision,  positive ETCO2 and breath sounds checked- equal and bilateral Secured at: 23 cm Tube secured with: Tape Dental Injury: Teeth and Oropharynx as per pre-operative assessment

## 2017-02-24 NOTE — Anesthesia Postprocedure Evaluation (Signed)
Anesthesia Post Note  Patient: Matthew Lewis  Procedure(s) Performed: IRRIGATION AND DEBRIDEMENT PERIRECTAL ABSCESS (N/A )     Patient location during evaluation: PACU Anesthesia Type: General Level of consciousness: awake and alert, patient cooperative and oriented Pain management: pain level controlled Vital Signs Assessment: post-procedure vital signs reviewed and stable Respiratory status: spontaneous breathing, nonlabored ventilation and respiratory function stable Cardiovascular status: blood pressure returned to baseline and stable Postop Assessment: no apparent nausea or vomiting Anesthetic complications: no    Last Vitals:  Vitals:   02/24/17 1924 02/24/17 1949  BP:  109/67  Pulse: 94 93  Resp: 18 19  Temp: 36.4 C 36.7 C  SpO2: 95% 97%    Last Pain:  Vitals:   02/24/17 1949  TempSrc: Oral  PainSc:                  Matthew Lewis,E. Jaquille Kau

## 2017-02-24 NOTE — Consult Note (Signed)
[facility] Consult/Admission Note  Matthew Lewis 12/16/1955  [id number].    Requesting MD: Mina Fawze, PA-C Chief Complaint/Reason for Consult: perirectal abscess  HPI:   Pt is a 61 y.o. male with history of diabetes, liver cirrhosis, colon cancer, polysubstance abuse, and recurrent perirectal abscesses presents for evaluation of acute onset, progressively worsening perirectal pain for 6 days. Pain is currently constant, severe, sharp and dilaudid did not take the pain away. Pain worse with touch. Associated constipation, difficulty urinating and nausea. Denies urinary urgency, hematuria, or dysuria. Denies vomiting, CP, SOB, LOC. He notes low-grade fevers with temperature of 100 F. Mild dizziness with standing. PCP started him on Augmentin 4 days ago. No blood in his stools. Pt has had these in the past with multiple surgeries. He is not anticoagulated. CT showed 6.8 x 5.8 perirectal abscess noted posterior to the rectum and wrapping around the right and left lateral margins of the rectum, right more prominent than left.  ROS:  Review of Systems  Constitutional: Positive for fever. Negative for chills and diaphoresis.  HENT: Negative for sore throat.   Respiratory: Negative for cough and shortness of breath.   Cardiovascular: Negative for chest pain.  Gastrointestinal: Positive for constipation and nausea. Negative for abdominal pain, blood in stool, diarrhea and vomiting.  Genitourinary: Negative for dysuria, frequency and urgency.  Skin: Negative for rash.  Neurological: Positive for dizziness. Negative for loss of consciousness.  All other systems reviewed and are negative.    Family History  Problem Relation Age of Onset  . Heart disease Father   . Heart disease Mother   . Cancer Mother   . Cancer Sister   . Heart disease Brother     Past Medical History:  Diagnosis Date  . Abscess of anal and rectal regions    horse shoe abscess  . Arthritis     "everywhere; especially in my spine"  . Cirrhosis (HCC)   . Colon cancer (HCC) 05/2005  . DM type 2 with diabetic peripheral neuropathy ([facility]CC)    left foot  . Fatigue   . Fistula, anal    multiple  . History of substance abuse    last alcohol was 08/2011; last marijuania was 08/2011  . Poor circulation   . PTSD (post-traumatic stress disorder)    "severe"  . Sleep deprivation   . Type II diabetes mellitus (HCC)     Past Surgical History:  Procedure Laterality Date  . ABSCESS DRAINAGE     "probably 15-20 so far at least; rectal"  . ANAL FISTULECTOMY  12/13/1998  . ANTERIOR FUSION CERVICAL SPINE  2010   "triple"; "did 2 surgeries on the same day in 2010 due to complications"  . CORONARY ANGIOPLASTY  2001  . detached muscle  2010   right chest; "after cervical fusion complications"  . ELBOW SURGERY  2007   "cut out part of a muscle"; left  . EXAMINATION UNDER ANESTHESIA  03/22/2005   fistula  . EXAMINATION UNDER ANESTHESIA  06/06/2011   Procedure: EXAM UNDER ANESTHESIA;  Surgeon: Faera Byerly, MD;  Location: [facility];  Service: General;  Laterality: N/A;  . HEMICOLECTOMY  06/06/2005   left  . INCISION AND DRAINAGE PERIRECTAL ABSCESS  03/30/2005  . INCISION AND DRAINAGE PERIRECTAL ABSCESS  12/13/2004  . INCISION AND DRAINAGE PERIRECTAL ABSCESS  07/14/2001  . INCISION AND DRAINAGE PERIRECTAL ABSCESS  08/12/2010   horseshoe abscess; Dr Wilson  . INCISION AND DRAINAGE PERIRECTAL ABSCESS  06/06/2011   Procedure:   IRRIGATION AND DEBRIDEMENT PERIRECTAL ABSCESS;  Surgeon: Faera Byerly, MD;  Location: [facility];  Service: General;  Laterality: N/A;  . LUMBAR DISC SURGERY  1987  . PERCUTANEOUS PINNING PHALANX FRACTURE OF HAND  ~ 2008   "plates in 2 places"; right  . THORACIC DISCECTOMY  1990's    Social History:  reports that he quit smoking about 26 years ago. His smoking use included cigarettes. He has a 10.00 pack-year smoking history. he has never used smokeless tobacco. He reports that he drinks  alcohol. He reports that he uses drugs. Drugs: Oxycodone and Marijuana.  Allergies:  Allergies  Allergen Reactions  . Other Other (See Comments)    Patient is recovering from drug addiction does not want any narcotics     (Not in a hospital admission)  Blood pressure 139/77, pulse (!) 102, temperature 98 F (36.7 C), resp. rate 18, weight 250 lb (113.4 kg), SpO2 96 %.  Physical Exam  Constitutional: He is oriented to person, place, and time and well-developed, well-nourished, and in no distress. No distress.  HENT:  Head: Normocephalic and atraumatic.  Nose: Nose normal.  Mouth/Throat: Mucous membranes are normal.  Eyes: Conjunctivae are normal. Right eye exhibits no discharge. Left eye exhibits no discharge. No scleral icterus.  Pupils are equal and round  Neck: Normal range of motion. Neck supple.  Cardiovascular: Normal rate, regular rhythm and normal heart sounds.  No murmur heard. Pulmonary/Chest: Effort normal and breath sounds normal. No respiratory distress. He has no wheezes. He has no rhonchi. He has no rales.  Abdominal: Soft. Normal appearance and bowel sounds are normal. He exhibits no distension. There is splenomegaly. There is generalized tenderness (very mild). There is no rigidity and no guarding.  Genitourinary:  Genitourinary Comments: Large area of induration and erythema to R side of buttock near rectum, unable to visualize rectum as exam caused extensive pain  Musculoskeletal: Normal range of motion. He exhibits no edema, tenderness or deformity.  Neurological: He is alert and oriented to person, place, and time.  Skin: Skin is warm and dry. No rash noted. He is not diaphoretic.  Psychiatric: Mood and affect normal.  Nursing note and vitals reviewed.   Results for orders placed or performed during the hospital encounter of 02/24/17 (from the past 48 hour(s))  Comprehensive metabolic panel     Status: Abnormal   Collection Time: 02/24/17  9:19 AM  Result  Value Ref Range   Sodium 131 (L) 135 - 145 mmol/L   Potassium 4.2 3.5 - 5.1 mmol/L   Chloride 99 (L) 101 - 111 mmol/L   CO2 18 (L) 22 - 32 mmol/L   Glucose, Bld 166 (H) 65 - 99 mg/dL   BUN 12 6 - 20 mg/dL   Creatinine, Ser 0.73 0.61 - 1.24 mg/dL   Calcium 9.0 8.9 - 10.3 mg/dL   Total Protein 7.4 6.5 - 8.1 g/dL   Albumin 3.1 (L) 3.5 - 5.0 g/dL   AST 30 15 - 41 U/L   ALT 31 17 - 63 U/L   Alkaline Phosphatase 100 38 - 126 U/L   Total Bilirubin 1.2 0.3 - 1.2 mg/dL   GFR calc non Af Amer >60 >60 mL/min   GFR calc Af Amer >60 >60 mL/min    Comment: (NOTE) The eGFR has been calculated using the CKD EPI equation. This calculation has not been validated in all clinical situations. eGFR's persistently <60 mL/min signify possible Chronic Kidney Disease.    Anion gap 14 5 -   15    Comment: Performed at [facility], [address] [place], [place] [address]  CBC with Differential     Status: Abnormal   Collection Time: 02/24/17  9:19 AM  Result Value Ref Range   WBC 14.1 (H) 4.0 - 10.5 K/uL   RBC 4.42 4.22 - 5.81 MIL/uL   Hemoglobin 14.1 13.0 - 17.0 g/dL   HCT 40.4 39.0 - 52.0 %   MCV 91.4 78.0 - 100.0 fL   MCH 31.9 26.0 - 34.0 pg   MCHC 34.9 30.0 - 36.0 g/dL   RDW 13.1 11.5 - 15.5 %   Platelets 203 150 - 400 K/uL   Neutrophils Relative % 82 %   Lymphocytes Relative 13 %   Monocytes Relative 5 %   Eosinophils Relative 0 %   Basophils Relative 0 %   Neutro Abs 11.6 (H) 1.7 - 7.7 K/uL   Lymphs Abs 1.8 0.7 - 4.0 K/uL   Monocytes Absolute 0.7 0.1 - 1.0 K/uL   Eosinophils Absolute 0.0 0.0 - 0.7 K/uL   Basophils Absolute 0.0 0.0 - 0.1 K/uL   Smear Review MORPHOLOGY UNREMARKABLE     Comment: Performed at [facility], [address] [place], [place] [address]  I-Stat CG4 Lactic Acid, ED     Status: Abnormal   Collection Time: 02/24/17  9:43 AM  Result Value Ref Range   Lactic Acid, Venous 2.26 (HH) 0.5 - 1.9 mmol/L   Comment NOTIFIED PHYSICIAN   POC occult blood, ED  Provider will collect     Status: None   Collection Time: 02/24/17 10:46 AM  Result Value Ref Range   Fecal Occult Bld NEGATIVE NEGATIVE  I-Stat CG4 Lactic Acid, ED     Status: None   Collection Time: 02/24/17  1:20 PM  Result Value Ref Range   Lactic Acid, Venous 1.25 0.5 - 1.9 mmol/L   Ct Abdomen Pelvis W Contrast  Result Date: 02/24/2017 CLINICAL DATA:  Anal pain.  History of perirectal abscess. EXAM: CT ABDOMEN AND PELVIS WITH CONTRAST TECHNIQUE: Multidetector CT imaging of the abdomen and pelvis was performed using the standard protocol following bolus administration of intravenous contrast. CONTRAST:  100mL ISOVUE-300 IOPAMIDOL (ISOVUE-300) INJECTION 61% COMPARISON:  06/13/2016 CT FINDINGS: Lower chest: Small nodule at the left lung base measures 8 mm, new since prior study. Scarring in the right middle lobe anteriorly. No effusions. Heart is normal size. Calcifications in the visualized right coronary artery. Hepatobiliary: Nodular contours of the liver surface compatible with cirrhosis. Gallbladder unremarkable. No focal hepatic abnormality. Pancreas: No focal abnormality or ductal dilatation. Spleen: Mild splenomegaly with a craniocaudal length of 16.4 cm. Adrenals/Urinary Tract: Punctate nonobstructing stone in the lower pole of the left kidney. No hydronephrosis. No focal renal or adrenal abnormality. Urinary bladder unremarkable. Stomach/Bowel: Gas and fluid collection noted posterior to the lower rectum, extending around both sides of the rectum, right more prominent than left compatible with perirectal abscess. This measures 6.8 x 5.8 cm on image 98. Moderate stool burden throughout the colon. Stomach and small bowel decompressed, unremarkable. Vascular/Lymphatic: Aortic and iliac calcifications. No evidence of aneurysm or adenopathy. Reproductive: No visible focal abnormality. Other: No free fluid or free air. Musculoskeletal: No acute bony abnormality. IMPRESSION: 6.8 x 5.8 perirectal  abscess noted posterior to the rectum and wrapping around the right and left lateral margins of the rectum, right more prominent than left. Moderate stool burden. Changes of cirrhosis.  Associated splenomegaly. Left lower pole nephrolithiasis. 8   mm left lower lobe pulmonary nodule, new since prior study. Non-contrast chest CT at 6-12 months is recommended. If the nodule is stable at time of repeat CT, then future CT at 18-24 months (from today's scan) is considered optional for low-risk patients, but is recommended for high-risk patients. This recommendation follows the consensus statement: Guidelines for Management of Incidental Pulmonary Nodules Detected on CT Images: From the Fleischner Society 2017; Radiology 2017; 284:228-243. Aortoiliac atherosclerosis, coronary artery disease. Electronically Signed   By: Kevin  Dover M.D.   On: 02/24/2017 11:34      Assessment/Plan  Perirectal abscess - OR today for I&D - Zosyn IV - NPO, IVF  Medicine to admit. Thank you for the consult.    L , PA-C [facility] 02/24/2017, 1:59 PM Pager: [phone] Consults: [phone] Mon-Fri 7:00 am-4:30 pm Sat-Sun 7:00 am-11:30 am  

## 2017-02-24 NOTE — H&P (Signed)
History and Physical    Matthew Lewis HQI:[id number] DOB: 1955-12-17 DOA: 02/24/2017  PCP: [name], MD Consultants:  Silverio Decamp - GI Patient coming from: Home - lives with wife, son and his girlfriend; NOK: wife, [phone]  Chief Complaint: perirectal abscess  HPI: Matthew Lewis is a 62 y.o. 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  It's a living hell".  This is his 15th or so recurrence, "some take care of themselves and others get cut".  ?related to DM.  He stopped drinking about 5 1/2 years ago and this did decrease recurrences.  He does not have h/o Crohn's.  He was never told that this was related to his prior h/o colon CA.  Last recurrence was 2013.  This time was typical - swelling, low-grade fever, dry lips, hard to pass a stool, now hard to urinate because the mass is so large.  He does request a foley.  Temp around 100.     ED Course:  Recurrent perirectal abscess, none recently.  Gen Surg cannot take today to the OR.  Surg asks TR[facility] to admit based on co-morbidities.  Review of Systems: As per HPI; otherwise review of systems reviewed and negative.   Ambulatory Status:  Ambulates without assistance  Past Medical History:  Diagnosis Date  . Abscess of anal and rectal regions    horse shoe abscess  . Alcoholic cirrhosis ([place])   . Arthritis    "everywhere; especially in my spine"  . Cirrhosis ([place])   . Colon cancer ([place]) 05/2005  . DM type 2 with diabetic peripheral neuropathy (HCC)    left foot  . Fatigue   . Fistula, anal    multiple  . History of substance abuse    last alcohol was 08/2011; last marijuania was 08/2011  . Poor circulation   . PTSD (post-traumatic stress disorder)    "severe"  . Sleep deprivation     Past Surgical History:  Procedure Laterality Date  . ABSCESS DRAINAGE     "probably 15-20 so far at least; rectal"  . ANAL FISTULECTOMY  12/13/1998  . ANTERIOR FUSION CERVICAL SPINE  2010   "triple"; "did 2 surgeries on the same day in 2536 due to complications"  . CORONARY ANGIOPLASTY  2001  . detached muscle  2010   right chest; "after cervical fusion complications"  . ELBOW SURGERY  2007   "cut out part of a muscle"; left  . EXAMINATION UNDER ANESTHESIA  03/22/2005   fistula  . EXAMINATION UNDER ANESTHESIA  06/06/2011   Procedure: EXAM UNDER ANESTHESIA;  Surgeon: Stark Klein, MD;  Location: [place];  Service: General;  Laterality: N/A;  . HEMICOLECTOMY  06/06/2005   left  . INCISION AND DRAINAGE PERIRECTAL ABSCESS  03/30/2005  . INCISION AND DRAINAGE PERIRECTAL ABSCESS  12/13/2004  . INCISION AND DRAINAGE PERIRECTAL ABSCESS  07/14/2001  . INCISION AND DRAINAGE PERIRECTAL ABSCESS  08/12/2010   horseshoe abscess; Dr Redmond Pulling  . INCISION AND DRAINAGE PERIRECTAL ABSCESS  06/06/2011   Procedure: IRRIGATION AND DEBRIDEMENT PERIRECTAL ABSCESS;  Surgeon: Stark Klein, MD;  Location: [place];  Service: General;  Laterality: N/A;  . LUMBAR [place]  . PERCUTANEOUS PINNING PHALANX FRACTURE OF HAND  ~ 2008   "plates in 2 places"; right  . THORACIC DISCECTOMY  1990's    Social History   Socioeconomic History  . Marital status:  Married    Spouse name: Not on file  . Number of children: Not on file  . Years of education: Not on file  . Highest education level: Not on file  Social Needs  . Financial resource strain: Not on file  . Food insecurity - worry: Not on file  . Food insecurity - inability: Not on file  . Transportation needs - medical: Not on file  . Transportation needs - non-medical: Not on file  Occupational History  . Occupation: works at [employer]  . Smoking status: Former Smoker    Packs/day: 1.00    Years: 10.00    Pack years: 10.00    Types: Cigarettes    Last attempt to quit: 01/08/1991    Years since quitting: 26.1  .  Smokeless tobacco: Never Used  Substance and Sexual Activity  . Alcohol use: Yes    Comment: 06/05/11 "drank enough when I was younger to last me my whole life; don't remember when I had my last drink, maybe 2012"  . Drug use: Yes    Types: Oxycodone, Marijuana    Comment: 06/05/11 "pot head in my younger days; 2009 dr had me on oxycodone and valium for almost 92yr  . Sexual activity: Not Currently  Other Topics Concern  . Not on file  Social History Narrative  . Not on file    Allergies  Allergen Reactions  . Other Other (See Comments)    Patient is recovering from drug addiction does not want any narcotics    Family History  Problem Relation Age of Onset  . Heart disease Father   . Heart disease Mother   . Cancer Mother   . Cancer Sister   . Heart disease Brother     Prior to Admission medications   Medication Sig Start Date End Date Taking? Authorizing Provider  acetaminophen (TYLENOL) 325 MG tablet Take 325 mg by mouth every 6 (six) hours as needed for moderate pain.   Yes [provider]  amLODipine (NORVASC) 10 MG tablet Take 10 mg by mouth daily.   Yes [provider]  amoxicillin-clavulanate (AUGMENTIN) 500-125 MG tablet Take 1 tablet by mouth 2 (two) times daily. for 10 days 02/20/17  Yes [provider]  aspirin EC 81 MG tablet Take 81 mg by mouth daily.   Yes [provider]  atorvastatin (LIPITOR) 40 MG tablet Take 1 tablet (40 mg total) by mouth daily at 6 PM. 12/02/12  Yes Viyuoh, Adeline C, MD  cyclobenzaprine (FLEXERIL) 10 MG tablet Take 10 mg by mouth 3 (three) times daily as needed for muscle spasms.   Yes [provider]  DULoxetine (CYMBALTA) 30 MG capsule Take 30 mg by mouth 2 (two) times daily. 02/05/17  Yes [provider]  empagliflozin (JARDIANCE) 10 MG TABS tablet Take 10 mg by mouth at bedtime.    Yes [provider]  gabapentin (NEURONTIN) 400 MG capsule Take 400 mg by mouth 2 (two) times  daily.   Yes [provider]  glipiZIDE (GLUCOTROL) 5 MG tablet Take 5 mg by mouth 2 (two) times daily before a meal.   Yes [provider]  ibuprofen (ADVIL,MOTRIN) 200 MG tablet Take 200 mg by mouth every 6 (six) hours as needed for moderate pain.   Yes [provider]  Insulin Detemir (LEVEMIR FLEXPEN) 100 UNIT/ML Pen Inject 10-60 Units into the skin 2 (two) times daily. Take 60 units in the am and Take 10 units in  the pm   Yes [provider]  lisinopril (PRINIVIL,ZESTRIL) 40 MG tablet Take 40 mg by mouth daily.   Yes [provider]  metFORMIN (GLUCOPHAGE) 1000 MG tablet Take 1,000 mg by mouth 2 (two) times daily with a meal.   Yes [provider]  Multiple Vitamin (MULTIVITAMIN) tablet Take 1 tablet by mouth daily.   Yes [provider]  HYDROcodone-acetaminophen (NORCO/VICODIN) 5-325 MG tablet Take 1-2 tablets by mouth every 4 (four) hours as needed for moderate pain. Patient not taking: Reported on 02/24/2017 06/14/16   Kerney Elbe, DO    Physical Exam: Vitals:   02/24/17 1500 02/24/17 1515 02/24/17 1530 02/24/17 1545  BP: 124/81 126/71 120/72 122/67  Pulse: (!) 101 (!) 103 98 (!) 102  Resp:   16   Temp:      SpO2: 97% 95% 96% 95%  Weight:      Height: 5' 10.47" (1.79 m)        General:  Appears calm and comfortable and is NAD Eyes:  PERRL, EOMI, normal lids, iris ENT:  grossly normal hearing, lips & tongue, mmm Neck:  no LAD, masses or thyromegaly Cardiovascular:  Very mild tachycardia, no m/r/g. No LE edema.  Respiratory:   CTA bilaterally with no wheezes/rales/rhonchi.  Normal respiratory effort. Abdomen:  soft, NT, ND, NABS, protuberant without fluid wave Back:   normal alignment, no CVAT Skin: there is a large (about 3 cm) fluctuant apparent abscess on the left midline buttock with marked surrounding fluctuance and erythema that is exquisitely painful to touch Musculoskeletal:  grossly normal tone  BUE/BLE, good ROM, no bony abnormality Psychiatric:  grossly normal mood and affect, speech fluent and appropriate, AOx3 Neurologic:  CN 2-12 grossly intact, moves all extremities in coordinated fashion, sensation intact    Radiological Exams on Admission: Ct Abdomen Pelvis W Contrast  Result Date: 02/24/2017 CLINICAL DATA:  Anal pain.  History of perirectal abscess. EXAM: CT ABDOMEN AND PELVIS WITH CONTRAST TECHNIQUE: Multidetector CT imaging of the abdomen and pelvis was performed using the standard protocol following bolus administration of intravenous contrast. CONTRAST:  135m ISOVUE-300 IOPAMIDOL (ISOVUE-300) INJECTION 61% COMPARISON:  06/13/2016 CT FINDINGS: Lower chest: Small nodule at the left lung base measures 8 mm, new since prior study. Scarring in the right middle lobe anteriorly. No effusions. Heart is normal size. Calcifications in the visualized right coronary artery. Hepatobiliary: Nodular contours of the liver surface compatible with cirrhosis. Gallbladder unremarkable. No focal hepatic abnormality. Pancreas: No focal abnormality or ductal dilatation. Spleen: Mild splenomegaly with a craniocaudal length of 16.4 cm. Adrenals/Urinary Tract: Punctate nonobstructing stone in the lower pole of the left kidney. No hydronephrosis. No focal renal or adrenal abnormality. Urinary bladder unremarkable. Stomach/Bowel: Gas and fluid collection noted posterior to the lower rectum, extending around both sides of the rectum, right more prominent than left compatible with perirectal abscess. This measures 6.8 x 5.8 cm on image 98. Moderate stool burden throughout the colon. Stomach and small bowel decompressed, unremarkable. Vascular/Lymphatic: Aortic and iliac calcifications. No evidence of aneurysm or adenopathy. Reproductive: No visible focal abnormality. Other: No free fluid or free air. Musculoskeletal: No acute bony abnormality. IMPRESSION: 6.8 x 5.8 perirectal abscess noted posterior to the rectum  and wrapping around the right and left lateral margins of the rectum, right more prominent than left. Moderate stool burden. Changes of cirrhosis.  Associated splenomegaly. Left lower pole nephrolithiasis. 8 mm left lower lobe pulmonary nodule, new since prior study. Non-contrast chest CT at 6-12  months is recommended. If the nodule is stable at time of repeat CT, then future CT at 18-24 months (from today's scan) is considered optional for low-risk patients, but is recommended for high-risk patients. This recommendation follows the consensus statement: Guidelines for Management of Incidental Pulmonary Nodules Detected on CT Images: From the Fleischner Society 2017; Radiology 2017; 284:228-243. Aortoiliac atherosclerosis, coronary artery disease. Electronically Signed   By: Rolm Baptise M.D.   On: 02/24/2017 11:34    EKG: not done   Labs on Admission: I have personally reviewed the available labs and imaging studies at the time of the admission.  Pertinent labs:   Heme negative Lactate 2.26, 1.25 Na++ 131, stable CO2 18 Glucose 166 Albumin 3.1 WBC 14.1  Assessment/Plan Principal Problem:   Perirectal abscess Active Problems:   Hypertension   Diabetes mellitus (HCC)   Hyponatremia   Moderate protein-calorie malnutrition (HCC)   Sepsis (HCC)   Pulmonary nodule   Alcoholic cirrhosis of liver without ascites (HCC)   Perirectal abscess with sepsis physiology -Elevated WBC count, tachycardia with elevated lactate -While awaiting blood cultures, this appears to be a preseptic condition. -Sepsis protocol initiated -Blood cultures pending -Patient with h/o perirectal abscesses and now with recurrent one - it is exquisitely painful -CT obtained and reports 6.8 x 5.8 perirectal abscess with surrounding of the rectum -Will need surgical I&D but according to the ER, the OR is unable to accommodate the patient today so will give clear liquids and make NPO after midnight -He was given  Clindamycin in the ER but will change to Zosyn -He had been started on Augmentin yesterday but this is held -Will admit to Med Surg and continue to monitor -Will trend lactate to ensure improvement -Given his h/o substance abuse, will attempt to minimize narcotic pain control.  However, he has a clear and appropriate reason fro acute pain and this must be treated.  Will give Tylenol; Oxy IR; Toradol; and morphine.  Cirrhosis -Child-Pugh Class A; MELD score 6 -No apparent decompensation  HTN -Continue Norvasc, Lisinopril  DM -Will check A1c - last 6.7 in 6/18 -hold Glucophage, glucotrol, and Jardiance -Continue Levemir -Cover with moderate-scale SSI  Hyponatremia -Appears to be chronic and stable, mild -Will follow  Malnutrition -Normal albumin when previously checked -Suspect that patient has had poor nutritional intake given progression of this abscess -If this is the cause, it would be expected to improve spontaneously once the abscess is improved  Pulmonary nodule -Incidental finding of new 24m LLL nodule -Needs repeat CT in 6-12 months    DVT prophylaxis: SCDs Code Status:   DNR - confirmed with patient/family Family Communication: Wife present throughout evaluation  Disposition Plan:  Home once clinically improved Consults called: Surgery  Admission status: Admit - It is my clinical opinion that admission to INPATIENT is reasonable and necessary because of the expectation that this patient will require hospital care that crosses at least 2 midnights to treat this condition based on the medical complexity of the problems presented.  Given the aforementioned information, the predictability of an adverse outcome is felt to be significant.    JKarmen BongoMD [facility]  If note is complete, please contact covering daytime or nighttime physician. [email or web address] Password [email or web address] 02/24/2017, 5:09 PM

## 2017-02-24 NOTE — Transfer of Care (Signed)
Immediate Anesthesia Transfer of Care Note  Patient: Matthew Lewis  Procedure(s) Performed: IRRIGATION AND DEBRIDEMENT PERIRECTAL ABSCESS (N/A )  Patient Location: PACU  Anesthesia Type:General  Level of Consciousness: awake, alert  and oriented  Airway & Oxygen Therapy: Patient Spontanous Breathing and Patient connected to nasal cannula oxygen  Post-op Assessment: Report given to RN and Post -op Vital signs reviewed and stable  Post vital signs: Reviewed and stable  Last Vitals:  Vitals:   02/24/17 1530 02/24/17 1545  BP: 120/72 122/67  Pulse: 98 (!) 102  Resp: 16   Temp:    SpO2: 96% 95%    Last Pain:  Vitals:   02/24/17 1133  PainSc: 5          Complications: No apparent anesthesia complications

## 2017-02-24 NOTE — Progress Notes (Signed)
Pharmacy Antibiotic Note  Matthew Lewis is a 62 y.o. male admitted on 02/24/2017 with perirectal abscess. Pharmacy has been consulted for zosyn dosing. Renal function wnl.  Plan: 1) Zosyn 3.375g IV q8 (4 hour infusion) 2) Follow renal function, cultures, LOT  Height: 5' 10.47" (179 cm) Weight: 250 lb (113.4 kg) IBW/kg (Calculated) : 74.09  Temp (24hrs), Avg:98 F (36.7 C), Min:98 F (36.7 C), Max:98 F (36.7 C)  Recent Labs  Lab 02/24/17 0919 02/24/17 0943 02/24/17 1320  WBC 14.1*  --   --   CREATININE 0.73  --   --   LATICACIDVEN  --  2.26* 1.25    Estimated Creatinine Clearance: 120 mL/min (by C-G formula based on SCr of 0.73 mg/dL).    Allergies  Allergen Reactions  . Other Other (See Comments)    Patient is recovering from drug addiction does not want any narcotics    Antimicrobials this admission: 2/18 Zosyn >>  Dose adjustments this admission: n/a  Microbiology results: 2/18 blood cx >>  Thank you for allowing pharmacy to be a part of this patient's care.  Deboraha Sprang 02/24/2017 3:11 PM

## 2017-02-24 NOTE — Progress Notes (Signed)
Patient arrived to 6N30  from PACU. Alert and oriented x4.VS stable. Packing to incision intact with minimal drainage noted. Denies pain at this time. Wife at bedside. Oriented to room and call bell.

## 2017-02-24 NOTE — ED Triage Notes (Signed)
Pt presents to the ed with complaints of anal pain feeling like what he thinks is an anal fistula and abscess. Pt reports that it is now affecting him being able to urinate.

## 2017-02-24 NOTE — ED Notes (Signed)
Critical lab value: Lactic Acid: 2.26   * reported to Dr. Laverta Baltimore and Loma Sousa, RN

## 2017-02-24 NOTE — ED Provider Notes (Signed)
[facility] EMERGENCY DEPARTMENT Provider Note   CSN: [id number] Arrival date & time: 02/24/17  0854     History   Chief Complaint Chief Complaint  Patient presents with  . Anal Fistula    HPI Matthew Lewis is a 62 y.o. male with history of diabetes, liver cirrhosis, colon cancer, polysubstance abuse, and recurrent perirectal abscesses presents for evaluation of acute onset, progressively worsening perirectal pain for 6 days.  States he noted pain on Wednesday which has been progressively worsening.  Pain is currently constant, sharp and stabbing and burning.  Worsens with attempts to have a bowel movement.  He states he has been able to move a small amount of stool but in general feels quite constipated.  No melena or hematochezia.  He also notes decreased urine output over the past couple of days but states that he has urinated some today.  Denies urinary urgency, hematuria, or dysuria.  He notes low-grade fevers with temperature of 100 F.  He called his primary care physician who started him on Augmentin twice daily which he has been taking since Thursday.  Has tried ibuprofen and Tylenol without relief of his symptoms.  Endorses nausea but no vomiting.  Denies abdominal pain.  States this feels like the last time he had a perirectal abscess for which he required admission and I&D 6 years ago.  States he has been compliant with his diabetes medications.  The history is provided by the patient.    Past Medical History:  Diagnosis Date  . Abscess of anal and rectal regions    horse shoe abscess  . Alcoholic cirrhosis ([place])   . Arthritis    "everywhere; especially in my spine"  . Cirrhosis ([place])   . Colon cancer ([place]) 05/2005  . DM type 2 with diabetic peripheral neuropathy ([facility])    left foot  . Fatigue   . Fistula, anal    multiple  . History of substance abuse    last alcohol was 08/2011; last marijuania was 08/2011  . Poor circulation   . PTSD  (post-traumatic stress disorder)    "severe"  . Sleep deprivation   . Type II diabetes mellitus [facility])     Patient Active Problem List   Diagnosis Date Noted  . Perirectal abscess 02/24/2017  . Duodenitis 06/13/2016  . Abnormal LFTs 06/13/2016  . Hyponatremia 06/12/2016  . Nausea & vomiting 06/12/2016  . Thrombocytopenia ([place]) 06/12/2016  . Acute gastroenteritis 06/12/2016  . PTSD (post-traumatic stress disorder) 12/02/2012  . Hx of substance abuse- clean 15 months, in AA 12/02/2012  . Chest pain with moderate risk of acute coronary syndrome 11/30/2012  . Obesity 11/30/2012  . Hypertension 11/30/2012  . Hyperlipemia 11/30/2012  . Diabetes mellitus ([place]) 11/30/2012  . CAD (coronary artery disease)- PCI 2001- no evaluation since 11/30/2012    Past Surgical History:  Procedure Laterality Date  . ABSCESS DRAINAGE     "probably 15-20 so far at least; rectal"  . ANAL FISTULECTOMY  12/13/1998  . ANTERIOR FUSION CERVICAL SPINE  2010   "triple"; "did 2 surgeries on the same day in 2694 due to complications"  . CORONARY ANGIOPLASTY  2001  . detached muscle  2010   right chest; "after cervical fusion complications"  . ELBOW SURGERY  2007   "cut out part of a muscle"; left  . EXAMINATION UNDER ANESTHESIA  03/22/2005   fistula  . EXAMINATION UNDER ANESTHESIA  06/06/2011   Procedure: EXAM UNDER ANESTHESIA;  Surgeon: Dorris Fetch  Barry Dienes, MD;  Location: [facility];  Service: General;  Laterality: N/A;  . HEMICOLECTOMY  06/06/2005   left  . INCISION AND DRAINAGE PERIRECTAL ABSCESS  03/30/2005  . INCISION AND DRAINAGE PERIRECTAL ABSCESS  12/13/2004  . INCISION AND DRAINAGE PERIRECTAL ABSCESS  07/14/2001  . INCISION AND DRAINAGE PERIRECTAL ABSCESS  08/12/2010   horseshoe abscess; Dr Redmond Pulling  . INCISION AND DRAINAGE PERIRECTAL ABSCESS  06/06/2011   Procedure: IRRIGATION AND DEBRIDEMENT PERIRECTAL ABSCESS;  Surgeon: Stark Klein, MD;  Location: [facility];  Service: General;  Laterality: N/A;  . LUMBAR Pump Back  . PERCUTANEOUS PINNING PHALANX FRACTURE OF HAND  ~ 2008   "plates in 2 places"; right  . THORACIC DISCECTOMY  1990's       Home Medications    Prior to Admission medications   Medication Sig Start Date End Date Taking? Authorizing Provider  acetaminophen (TYLENOL) 325 MG tablet Take 325 mg by mouth every 6 (six) hours as needed for moderate pain.   Yes [provider]  amLODipine (NORVASC) 10 MG tablet Take 10 mg by mouth daily.   Yes [provider]  amoxicillin-clavulanate (AUGMENTIN) 500-125 MG tablet Take 1 tablet by mouth 2 (two) times daily. for 10 days 02/20/17  Yes [provider]  aspirin EC 81 MG tablet Take 81 mg by mouth daily.   Yes [provider]  atorvastatin (LIPITOR) 40 MG tablet Take 1 tablet (40 mg total) by mouth daily at 6 PM. 12/02/12  Yes Viyuoh, Adeline C, MD  cyclobenzaprine (FLEXERIL) 10 MG tablet Take 10 mg by mouth 3 (three) times daily as needed for muscle spasms.   Yes [provider]  DULoxetine (CYMBALTA) 30 MG capsule Take 30 mg by mouth 2 (two) times daily. 02/05/17  Yes [provider]  empagliflozin (JARDIANCE) 10 MG TABS tablet Take 10 mg by mouth at bedtime.    Yes [provider]  gabapentin (NEURONTIN) 400 MG capsule Take 400 mg by mouth 2 (two) times daily.   Yes [provider]  glipiZIDE (GLUCOTROL) 5 MG tablet Take 5 mg by mouth 2 (two) times daily before a meal.   Yes [provider]  ibuprofen (ADVIL,MOTRIN) 200 MG tablet Take 200 mg by mouth every 6 (six) hours as needed for moderate pain.   Yes [provider]  Insulin Detemir (LEVEMIR FLEXPEN) 100 UNIT/ML Pen Inject 10-60 Units into the skin 2 (two) times daily. Take 60 units in the am and Take 10 units in the pm   Yes [provider]  lisinopril (PRINIVIL,ZESTRIL) 40 MG tablet Take 40 mg by mouth daily.   Yes [provider]  metFORMIN (GLUCOPHAGE) 1000 MG tablet Take 1,000 mg by  mouth 2 (two) times daily with a meal.   Yes [provider]  Multiple Vitamin (MULTIVITAMIN) tablet Take 1 tablet by mouth daily.   Yes [provider]    Family History Family History  Problem Relation Age of Onset  . Heart disease Father   . Heart disease Mother   . Cancer Mother   . Cancer Sister   . Heart disease Brother     Social History Social History   Tobacco Use  . Smoking status: Former Smoker    Packs/day: 1.00    Years: 10.00    Pack years: 10.00    Types: Cigarettes    Last attempt to quit: 01/08/1991    Years since quitting: 26.1  . Smokeless tobacco: Never Used  Substance Use Topics  . Alcohol use: Yes    Comment: 06/05/11 "drank enough when I was younger to last me my whole life; don't remember when I had my last drink, maybe 2012"  . Drug use: Yes    Types: Oxycodone, Marijuana    Comment: 06/05/11 "pot head in my younger days; 2009 dr had me on oxycodone and valium for almost 39yr"     Allergies   Other   Review of Systems Review of Systems  Constitutional: Positive for chills and fever.  Gastrointestinal: Positive for constipation, nausea and rectal pain. Negative for abdominal pain, blood in stool and vomiting.  Genitourinary: Positive for difficulty urinating.  All other systems reviewed and are negative.    Physical Exam Updated Vital Signs BP 116/67   Pulse (!) 103   Temp 98 F (36.7 C)   Resp 18   Ht 5' 10.47" (1.79 m)   Wt 113.4 kg (250 lb)   SpO2 95%   BMI 35.39 kg/m   Physical Exam  Constitutional: He appears well-developed and well-nourished. No distress.  HENT:  Head: Normocephalic and atraumatic.  Eyes: Conjunctivae are normal. Right eye exhibits no discharge. Left eye exhibits no discharge.  Neck: Normal range of motion. Neck supple. No JVD present. No tracheal deviation present.  Cardiovascular: Regular rhythm and normal heart sounds.  Tachycardic  Pulmonary/Chest: Effort normal and breath sounds  normal.  Abdominal: Soft. Bowel sounds are normal. He exhibits no distension. There is no tenderness. There is no guarding.  No CVA tenderness bilaterally  Genitourinary:  Genitourinary Comments: Examination performed in the presence of chaperone.  No evidence of external hemorrhoids.  There is a large area of erythema, fluctuance, and tenderness extending from the 1 o'clock to 5 o'clock positions.  Well-healed surgical scars also noted.  No bogginess of the prostate.  No frank rectal bleeding noted.   Musculoskeletal: Normal range of motion. He exhibits no edema.  Neurological: He is alert.  Skin: Skin is warm and dry. No erythema.  Psychiatric: He has a normal mood and affect. His behavior is normal.  Nursing note and vitals reviewed.    ED Treatments / Results  Labs (all labs ordered are listed, but only abnormal results are displayed) Labs Reviewed  COMPREHENSIVE METABOLIC PANEL - Abnormal; Notable for the following components:      Result Value   Sodium 131 (*)    Chloride 99 (*)    CO2 18 (*)    Glucose, Bld 166 (*)    Albumin 3.1 (*)    All other components within normal limits  CBC WITH DIFFERENTIAL/PLATELET - Abnormal; Notable for the following components:   WBC 14.1 (*)    Neutro Abs 11.6 (*)    All other components within normal limits  I-STAT CG4 LACTIC ACID, ED - Abnormal; Notable for the following components:   Lactic Acid, Venous 2.26 (*)    All other components within normal limits  CULTURE, BLOOD (ROUTINE X 2)  CULTURE, BLOOD (ROUTINE X 2)  URINALYSIS, ROUTINE W REFLEX MICROSCOPIC  POC OCCULT BLOOD, ED  I-STAT CG4 LACTIC ACID, ED    EKG  EKG Interpretation None       Radiology Ct Abdomen Pelvis W Contrast  Result Date: 02/24/2017 CLINICAL DATA:  Anal pain.  History of perirectal abscess. EXAM: CT ABDOMEN AND PELVIS WITH CONTRAST TECHNIQUE: Multidetector CT imaging of the abdomen and pelvis was performed using the standard protocol following bolus  administration of intravenous contrast. CONTRAST:  114mL  ISOVUE-300 IOPAMIDOL (ISOVUE-300) INJECTION 61% COMPARISON:  06/13/2016 CT FINDINGS: Lower chest: Small nodule at the left lung base measures 8 mm, new since prior study. Scarring in the right middle lobe anteriorly. No effusions. Heart is normal size. Calcifications in the visualized right coronary artery. Hepatobiliary: Nodular contours of the liver surface compatible with cirrhosis. Gallbladder unremarkable. No focal hepatic abnormality. Pancreas: No focal abnormality or ductal dilatation. Spleen: Mild splenomegaly with a craniocaudal length of 16.4 cm. Adrenals/Urinary Tract: Punctate nonobstructing stone in the lower pole of the left kidney. No hydronephrosis. No focal renal or adrenal abnormality. Urinary bladder unremarkable. Stomach/Bowel: Gas and fluid collection noted posterior to the lower rectum, extending around both sides of the rectum, right more prominent than left compatible with perirectal abscess. This measures 6.8 x 5.8 cm on image 98. Moderate stool burden throughout the colon. Stomach and small bowel decompressed, unremarkable. Vascular/Lymphatic: Aortic and iliac calcifications. No evidence of aneurysm or adenopathy. Reproductive: No visible focal abnormality. Other: No free fluid or free air. Musculoskeletal: No acute bony abnormality. IMPRESSION: 6.8 x 5.8 perirectal abscess noted posterior to the rectum and wrapping around the right and left lateral margins of the rectum, right more prominent than left. Moderate stool burden. Changes of cirrhosis.  Associated splenomegaly. Left lower pole nephrolithiasis. 8 mm left lower lobe pulmonary nodule, new since prior study. Non-contrast chest CT at 6-12 months is recommended. If the nodule is stable at time of repeat CT, then future CT at 18-24 months (from today's scan) is considered optional for low-risk patients, but is recommended for high-risk patients. This recommendation follows the  consensus statement: Guidelines for Management of Incidental Pulmonary Nodules Detected on CT Images: From the Fleischner Society 2017; Radiology 2017; 284:228-243. Aortoiliac atherosclerosis, coronary artery disease. Electronically Signed   By: Rolm Baptise M.D.   On: 02/24/2017 11:34    Procedures Procedures (including critical care time)  Medications Ordered in ED Medications  piperacillin-tazobactam (ZOSYN) IVPB 3.375 g (not administered)  sodium chloride 0.9 % bolus 1,000 mL (0 mLs Intravenous Stopped 02/24/17 1300)  HYDROmorphone (DILAUDID) injection 0.5 mg (0.5 mg Intravenous Given 02/24/17 1053)  clindamycin (CLEOCIN) IVPB 600 mg (0 mg Intravenous Stopped 02/24/17 1133)  iopamidol (ISOVUE-300) 61 % injection (100 mLs  Contrast Given 02/24/17 1111)     Initial Impression / Assessment and Plan / ED Course  I have reviewed the triage vital signs and the nursing notes.  Pertinent labs & imaging results that were available during my care of the patient were reviewed by me and considered in my medical decision making (see chart for details).     Patient with history of perirectal abscess presents with rectal pain, low-grade fevers, and pain with bowel movements.  Afebrile, tachycardic while in the ED with some improvement after administration of fluids.  He is generally nontoxic in appearance.  He has a leukocytosis of 14 and elevated lactate of 2.26.  Obvious area of swelling and fluctuance in the perirectal region. Remainder of lab work is reassuring.  CT scan of the abdomen shows a 6.8 x 5.8 cm perirectal abscess which wraps around the right and left lateral margins of the rectum. Initiated IV clindamycin and fluids.  Pain controlled while in the ED. 1:04 PM Spoke with Nils Pyle with general surgery.  Current caseload is full and patient will likely undergo surgery tomorrow. 1:58 PM Spoke with Dr. Lorin Mercy who agrees to assume care of patient and bring him into the hospital for further  evaluation and management prior  to surgery which will likely be scheduled for tomorrow.  Final Clinical Impressions(s) / ED Diagnoses   Final diagnoses:  Perirectal abscess    ED Discharge Orders    None       Debroah Baller 02/24/17 1532    Margette Fast, MD 02/24/17 1906

## 2017-02-24 NOTE — Anesthesia Preprocedure Evaluation (Addendum)
Anesthesia Evaluation  Patient identified by MRN, date of birth, ID band Patient awake    Reviewed: Allergy & Precautions, NPO status , Patient's Chart, lab work & pertinent test results  History of Anesthesia Complications Negative for: history of anesthetic complications  Airway Mallampati: I  TM Distance: >3 FB Neck ROM: Full    Dental  (+) Dental Advisory Given, Teeth Intact, Caps   Pulmonary former smoker,    Pulmonary exam normal        Cardiovascular hypertension, Pt. on medications (-) angina+ CAD (2001 PCI)   Rhythm:Regular Rate:Normal  '14 ECHO: EF 55-60%, valves OK '14 stress: EF 57%, no ischemia   Neuro/Psych PSYCHIATRIC DISORDERS Anxiety negative neurological ROS     GI/Hepatic (+) Cirrhosis       , H/o colon cancer   Endo/Other  diabetes (glu 166), Oral Hypoglycemic Agents, Insulin DependentMorbid obesity  Renal/GU negative Renal ROS     Musculoskeletal  (+) Arthritis ,   Abdominal (+) + obese,   Peds  Hematology negative hematology ROS (+)   Anesthesia Other Findings   Reproductive/Obstetrics                            Anesthesia Physical Anesthesia Plan  ASA: III  Anesthesia Plan: General   Post-op Pain Management:    Induction: Intravenous  PONV Risk Score and Plan: 2 and Ondansetron and Dexamethasone  Airway Management Planned: Oral ETT  Additional Equipment:   Intra-op Plan:   Post-operative Plan: Extubation in OR  Informed Consent: I have reviewed the patients History and Physical, chart, labs and discussed the procedure including the risks, benefits and alternatives for the proposed anesthesia with the patient or authorized representative who has indicated his/her understanding and acceptance.   Dental advisory given  Plan Discussed with: CRNA and Surgeon  Anesthesia Plan Comments: (Plan routine monitors, GETA)        Anesthesia Quick  Evaluation

## 2017-02-24 NOTE — Op Note (Signed)
02/24/2017  6:05 PM  PATIENT:  Matthew Lewis  62 y.o. male  PRE-OPERATIVE DIAGNOSIS:  PERIRECTAL ABSCESS  POST-OPERATIVE DIAGNOSIS:  same  PROCEDURE:  Procedure(s): IRRIGATION AND DEBRIDEMENT PERIRECTAL ABSCESS (N/A)  SURGEON:  Surgeon(s) and Role:    * Ralene Ok, MD - Primary   ASSISTANTS: none   ANESTHESIA:   general  EBL:  50 mL   BLOOD ADMINISTERED:none  DRAINS: Penrose drain in the perirectal abscess cavity   LOCAL MEDICATIONS USED:  NONE  SPECIMEN:  Source of Specimen:  Perirectal abscess cultures x2  DISPOSITION OF SPECIMEN:  Micro  COUNTS:  YES  TOURNIQUET:  * No tourniquets in log *  DICTATION: .Dragon Dictation  Indication for procedure: Patient is a 62 year old male with a past medical history for diabetes, hypertension, recurrent perirectal abscesses comes in with a right recurrent perirectal abscess.  Patient was seen in ER taken to the operating room urgently for I&D of abscess.  Findings: Patient had approximately 8 x 4 cm right perirectal abscess.  This was started to track over to the left side, however not fully track.  A 1 inch Penrose drain was placed with retracting tissue to the skin using a 2-0 nylon.  Details of procedure: After the patient was continuously back to the operating placed in supine position with bilateral SCDs in place.  He underwent general endotracheal intubation.  Patient was then placed in lithotomy position.  Patient is prepped in the standard fashion.  A timeout was called and al facta were verified.  The right perirectal abscess was spontaneously draining.  A Kelly clamp was then used to dilate the opening.  Both anaerobic and aerobic cultures were then taken of the perirectal abscess.  Blunt dissection was used to break up all loculations within the perirectal abscess.  It did appear that superiorly and posteriorly the abscess was trying to cross over the left side however wound itself by approximately a  centimeter.  At this time the area was irrigated out with sterile saline.  A 1 inch Penrose drain was then placed within the most superior portion of the abscess cavity.  This was sutured to the skin using a 2-0 nylon x1.  The abscess was then dressed with 4 x 4's, ABD pad, and mesh panties.  Patient tolerated the procedure well was taken to the recovery room in stable condition.     PLAN OF CARE: Admit to inpatient   PATIENT DISPOSITION:  PACU - hemodynamically stable.   Delay start of Pharmacological VTE agent (>24hrs) due to surgical blood loss or risk of bleeding: yes

## 2017-02-25 ENCOUNTER — Encounter (HOSPITAL_COMMUNITY): Payer: Self-pay | Admitting: General Surgery

## 2017-02-25 DIAGNOSIS — Z79899 Other long term (current) drug therapy: Secondary | ICD-10-CM | POA: Diagnosis not present

## 2017-02-25 DIAGNOSIS — A419 Sepsis, unspecified organism: Secondary | ICD-10-CM | POA: Diagnosis not present

## 2017-02-25 DIAGNOSIS — E871 Hypo-osmolality and hyponatremia: Secondary | ICD-10-CM | POA: Diagnosis not present

## 2017-02-25 DIAGNOSIS — Z9861 Coronary angioplasty status: Secondary | ICD-10-CM | POA: Diagnosis not present

## 2017-02-25 DIAGNOSIS — K611 Rectal abscess: Secondary | ICD-10-CM | POA: Diagnosis not present

## 2017-02-25 DIAGNOSIS — Z7982 Long term (current) use of aspirin: Secondary | ICD-10-CM | POA: Diagnosis not present

## 2017-02-25 DIAGNOSIS — E440 Moderate protein-calorie malnutrition: Secondary | ICD-10-CM | POA: Diagnosis not present

## 2017-02-25 DIAGNOSIS — I10 Essential (primary) hypertension: Secondary | ICD-10-CM | POA: Diagnosis not present

## 2017-02-25 DIAGNOSIS — Z9049 Acquired absence of other specified parts of digestive tract: Secondary | ICD-10-CM | POA: Diagnosis not present

## 2017-02-25 DIAGNOSIS — E1142 Type 2 diabetes mellitus with diabetic polyneuropathy: Secondary | ICD-10-CM | POA: Diagnosis not present

## 2017-02-25 DIAGNOSIS — K6131 Horseshoe abscess: Secondary | ICD-10-CM | POA: Diagnosis not present

## 2017-02-25 DIAGNOSIS — Z87891 Personal history of nicotine dependence: Secondary | ICD-10-CM | POA: Diagnosis not present

## 2017-02-25 DIAGNOSIS — Z85038 Personal history of other malignant neoplasm of large intestine: Secondary | ICD-10-CM | POA: Diagnosis not present

## 2017-02-25 DIAGNOSIS — R911 Solitary pulmonary nodule: Secondary | ICD-10-CM | POA: Diagnosis not present

## 2017-02-25 DIAGNOSIS — Z794 Long term (current) use of insulin: Secondary | ICD-10-CM | POA: Diagnosis not present

## 2017-02-25 LAB — CBC
HCT: 37.7 % — ABNORMAL LOW (ref 39.0–52.0)
Hemoglobin: 12.8 g/dL — ABNORMAL LOW (ref 13.0–17.0)
MCH: 31.6 pg (ref 26.0–34.0)
MCHC: 34 g/dL (ref 30.0–36.0)
MCV: 93.1 fL (ref 78.0–100.0)
PLATELETS: 200 10*3/uL (ref 150–400)
RBC: 4.05 MIL/uL — ABNORMAL LOW (ref 4.22–5.81)
RDW: 13.1 % (ref 11.5–15.5)
WBC: 11.1 10*3/uL — AB (ref 4.0–10.5)

## 2017-02-25 LAB — BASIC METABOLIC PANEL
Anion gap: 12 (ref 5–15)
BUN: 14 mg/dL (ref 6–20)
CHLORIDE: 100 mmol/L — AB (ref 101–111)
CO2: 21 mmol/L — AB (ref 22–32)
CREATININE: 0.74 mg/dL (ref 0.61–1.24)
Calcium: 8.9 mg/dL (ref 8.9–10.3)
GFR calc Af Amer: >60 mL/min (ref >60)
GFR calc non Af Amer: >60 mL/min (ref >60)
GLUCOSE: 170 mg/dL — AB (ref 65–99)
Potassium: 5.3 mmol/L — ABNORMAL HIGH (ref 3.5–5.1)
SODIUM: 133 mmol/L — AB (ref 135–145)

## 2017-02-25 LAB — GLUCOSE, CAPILLARY
Glucose-Capillary: 147 mg/dL — ABNORMAL HIGH (ref 65–99)
Glucose-Capillary: 129 mg/dL — ABNORMAL HIGH (ref 65–99)

## 2017-02-25 MED ORDER — DOCUSATE SODIUM 100 MG PO CAPS: 100.0000 mg | ORAL_CAPSULE | Freq: Two times a day (BID) | ORAL | 0 refills | Status: DC

## 2017-02-25 NOTE — Discharge Summary (Signed)
Physician Discharge Summary  Patient ID: Matthew Lewis MRN: [id number] DOB/AGE: 1955/09/25 62 y.o.  Admit date: 02/24/2017 Discharge date: 02/25/2017  Admission Diagnoses:  Discharge Diagnoses:  Principal Problem:   Perirectal abscess Active Problems:   Hypertension   Diabetes mellitus ([place])   Hyponatremia   Moderate protein-calorie malnutrition (H[facility])   Sepsis ([place])   Pulmonary nodule   Alcoholic cirrhosis of liver without ascites ([place])   Discharged Condition: stable.  Brief history: Patient is a 62 year old Caucasian male with past medical history significant for diabetes mellitus, alcohol abuse and dependence, remote history of multiple recurrent rectal abscess, at least 11 previous recurrences; remote colon cancer; and alcoholic cirrhosis.  Patient was admitted with another reoccurrence of perirectal abscess.  Apparently, patient's symptoms started about 5 days prior to presentation.  Patient was already on Augmentin.  Surgical team was consulted and patient was taken to the OR for incision and drainage.  Patient has been cleared for discharge by the surgical team.  The patient will follow with PCP and surgery team within 1 week.  Hospital Course:  Perirectal abscess with sepsis - kindly see above.  CT scan of the abdomen and pelvis with IV contrast done on admission revealed perirectal abscess.  Surgical team was consulted.  Patient was taken to the OR for incision and drainage.  Patient was also treated with IV antibiotics during the hospital stay.  Patient looks much improved.  The patient will be discharged back on today.  Surgical team has cleared patient for discharge.  The Penrose drain will be left in situ on discharge as per this surgical team.  Cirrhosis -Child-Pugh Class A; MELD score 6 -No apparent decompensation  HTN -patient's systolic blood pressure was found to be on the low normal side during the hospital stay.  Norvasc will be held.  The patient will only be  discharged home on lisinopril.  The patient has been advised to monitor the blood pressure on discharge.  If the blood pressure becomes elevated, the patient has been advised to call the PCP for further management.  DM-continue to optimize.  Hyponatremia -Appears to be chronic and stable, mild - Sodium on discharge was 133.  Pulmonary nodule -Incidental finding of new 87m LLL nodule -Needs repeat CT in 6-12 months  Consults: general surgery  Significant Diagnostic Studies: CT scan of the abdomen and pelvis done with IV contrast revealed - "6.8 x 5.8 perirectal abscess noted posterior to the rectum and wrapping around the right and left lateral margins of the rectum, right more prominent than left.  Moderate stool burden. Changes of cirrhosis.  Associated splenomegaly. Left lower pole nephrolithiasis. 8 mm left lower lobe pulmonary nodule, new since prior study. Non-contrast chest CT at 6-12 months is recommended. If the nodule is stable at time of repeat CT, then future CT at 18-24 months (from today's scan) is considered optional for low-risk patients, but is recommended for high-risk patients. This recommendation follows the consensus statement: Guidelines for Management of Incidental Pulmonary Nodules Detected on CT Images: From the Fleischner Society 2017; Radiology 2017; 284:228-243".  Aortoiliac atherosclerosis, coronary artery disease.  Treatments: surgery: IRRIGATION AND DEBRIDEMENT PERIRECTAL ABSCESS   Discharge Exam: Blood pressure 110/65, pulse 73, temperature (!) 97.5 F (36.4 C), temperature source Oral, resp. rate 18, height 5' 11"  (1.803 m), weight 111.1 kg (244 lb 14.9 oz), SpO2 95 %.   Disposition: 01-Home or Self Care  Discharge Instructions    Call MD for:   Complete by:  As directed  Please call MD if the symptoms worsen.   Diet - low sodium heart healthy   Complete by:  As directed    Increase activity slowly   Complete by:  As directed      Allergies  as of 02/25/2017      Reactions   Other Other (See Comments)   Patient is recovering from drug addiction does not want any narcotics      Medication List    STOP taking these medications   acetaminophen 325 MG tablet Commonly known as:  TYLENOL   amLODipine 10 MG tablet Commonly known as:  NORVASC     TAKE these medications   amoxicillin-clavulanate 500-125 MG tablet Commonly known as:  AUGMENTIN Take 1 tablet by mouth 2 (two) times daily. for 10 days   aspirin EC 81 MG tablet Take 81 mg by mouth daily.   atorvastatin 40 MG tablet Commonly known as:  LIPITOR Take 1 tablet (40 mg total) by mouth daily at 6 PM.   cyclobenzaprine 10 MG tablet Commonly known as:  FLEXERIL Take 10 mg by mouth 3 (three) times daily as needed for muscle spasms.   docusate sodium 100 MG capsule Commonly known as:  COLACE Take 1 capsule (100 mg total) by mouth 2 (two) times daily.   DULoxetine 30 MG capsule Commonly known as:  CYMBALTA Take 30 mg by mouth 2 (two) times daily.   gabapentin 400 MG capsule Commonly known as:  NEURONTIN Take 400 mg by mouth 2 (two) times daily.   glipiZIDE 5 MG tablet Commonly known as:  GLUCOTROL Take 5 mg by mouth 2 (two) times daily before a meal.   ibuprofen 200 MG tablet Commonly known as:  ADVIL,MOTRIN Take 200 mg by mouth every 6 (six) hours as needed for moderate pain.   JARDIANCE 10 MG Tabs tablet Generic drug:  empagliflozin Take 10 mg by mouth at bedtime.   LEVEMIR FLEXPEN 100 UNIT/ML Pen Generic drug:  Insulin Detemir Inject 10-60 Units into the skin 2 (two) times daily. Take 60 units in the am and Take 10 units in the pm   lisinopril 40 MG tablet Commonly known as:  PRINIVIL,ZESTRIL Take 40 mg by mouth daily.   metFORMIN 1000 MG tablet Commonly known as:  GLUCOPHAGE Take 1,000 mg by mouth 2 (two) times daily with a meal.   multivitamin tablet Take 1 tablet by mouth daily.      Follow-up [facility],  [place]. Go on 03/05/2017.   Specialty:  General Surgery Why:  Your appointment is 2/27 at 4:10PM. Please arrive 30 minutes prior to your appointment to check in and fill out paperwork. Bring photo ID and insurance information. Contact information: [address] [place] [phone]          Signed: Bonnell Public 02/25/2017, 10:32 AM

## 2017-02-25 NOTE — Discharge Instructions (Signed)
Disposable Sitz Bath A disposable sitz bath is a plastic basin that fits over the toilet. A bag is hung above the toilet, and the bag is connected to a tube that opens into the basin. The bag is filled with warm water that flows into the basin through the tube. A sitz bath can be used to help relieve symptoms, clean, and promote healing in the genital and anal areas, as well as in the lower abdomen and buttocks. What are the risks? Sitz baths are generally very safe. It is possible for the skin between the genitals and the anus (perineum) to become infected, but this is rare. You can avoid this by cleaning your sitz bath supplies thoroughly. How to use a disposable sitz bath 1. Close the clamp on the tube. Make sure the clamp is closed tightly to prevent leakage. 2. Fill the sitz bath basin and the plastic bag with warm water. The water should be warm enough to be comfortable, but not hot. 3. Raise the toilet seat and place the filled basin on the toilet. Make sure the overflow opening is facing toward the back of the toilet. ? If you prefer, you may place the empty basin on the toilet first, and then use the plastic bag to fill the basin with warm water. 4. Hang the filled plastic bag overhead on a hook or towel rack close to the toilet. The bag should be higher than the toilet so that the water will flow down through the tube. 5. Attach the tube to the opening on the basin. Make sure that the tube is attached to the basin tightly to prevent leakage. 6. Sit on the basin and release the clamp. This will allow warm water to flow into the basin and flush the area around your genitals and anus. 7. Remain sitting on the basin for about 15-20 minutes, or as long as told by your health care provider. 8. Stand up and gently pat your skin dry. If directed, apply clean bandages (dressings) to the affected area as told by your health care provider. 9. Carefully remove the basin from the toilet seat and tip the  basin into the toilet to empty any remaining water. Empty any remaining water from the plastic bag into the toilet. Then, flush the toilet. 10. Wash the basin with warm water and soap. Let the basin air dry in the sink. You should also let the plastic bag and the tubing air dry. 11. Store the basin, tubing, and plastic bag in a clean, dry area. 12. Wash your hands with soap and water. If soap and water are not available, use hand sanitizer. Contact a health care provider if:  You have symptoms that get worse instead of better.  You develop new skin irritation, redness, or swelling around your genitals or anus. This information is not intended to replace advice given to you by your health care provider. Make sure you discuss any questions you have with your health care provider. Document Released: 06/25/2011 Document Revised: 06/01/2015 Document Reviewed: 11/13/2014 Elsevier Interactive Patient Education  [employer].

## 2017-02-25 NOTE — Progress Notes (Signed)
[facility] Progress Note  1 Day Post-Op  Subjective: CC-  No complaints. Pain well controlled. States that he is in remission therefore he does not want any narcotics.  Objective: Vital signs in last 24 hours: Temp:  [97.5 F (36.4 C)-98 F (36.7 C)] 97.5 F (36.4 C) (02/19 0556) Pulse Rate:  [73-112] 73 (02/19 0556) Resp:  [16-26] 18 (02/19 0556) BP: (90-139)/(53-88) 98/53 (02/19 0556) SpO2:  [94 %-98 %] 95 % (02/19 0556) Weight:  [111.1 kg (244 lb 14.9 oz)-113.4 kg (250 lb)] 111.1 kg (244 lb 14.9 oz) (02/18 1949) Last BM Date: 02/22/17  Intake/Output from previous day: 02/18 0701 - 02/19 0700 In: 2190 [P.O.:440; I.V.:700; IV Piggyback:1050] Out: 2575 [Urine:2525; Blood:50] Intake/Output this shift: No intake/output data recorded.  PE: Gen:  Alert, NAD, pleasant HEENT: EOM's intact, pupils equal and round Pulm:  effort normal Psych: A&Ox3  GU: perirectal abscess s/p I&D with penrose drain in place draining appropriately, mild induration  Lab Results:  Recent Labs    02/24/17 0919 02/25/17 0443  WBC 14.1* 11.1*  HGB 14.1 12.8*  HCT 40.4 37.7*  PLT 203 200   BMET Recent Labs    02/24/17 0919 02/25/17 0443  NA 131* 133*  K 4.2 5.3*  CL 99* 100*  CO2 18* 21*  GLUCOSE 166* 170*  BUN 12 14  CREATININE 0.73 0.74  CALCIUM 9.0 8.9   PT/INR No results for input(s): LABPROT, INR in the last 72 hours. CMP     Component Value Date/Time   NA 133 (L) 02/25/2017 0443   K 5.3 (H) 02/25/2017 0443   CL 100 (L) 02/25/2017 0443   CO2 21 (L) 02/25/2017 0443   GLUCOSE 170 (H) 02/25/2017 0443   BUN 14 02/25/2017 0443   CREATININE 0.74 02/25/2017 0443   CALCIUM 8.9 02/25/2017 0443   PROT 7.4 02/24/2017 0919   ALBUMIN 3.1 (L) 02/24/2017 0919   AST 30 02/24/2017 0919   ALT 31 02/24/2017 0919   ALKPHOS 100 02/24/2017 0919   BILITOT 1.2 02/24/2017 0919   GFRNONAA >60 02/25/2017 0443   GFRAA >60 02/25/2017 0443   Lipase     Component Value  Date/Time   LIPASE 21 06/12/2016 1424       Studies/Results: Ct Abdomen Pelvis W Contrast  Result Date: 02/24/2017 CLINICAL DATA:  Anal pain.  History of perirectal abscess. EXAM: CT ABDOMEN AND PELVIS WITH CONTRAST TECHNIQUE: Multidetector CT imaging of the abdomen and pelvis was performed using the standard protocol following bolus administration of intravenous contrast. CONTRAST:  132m ISOVUE-300 IOPAMIDOL (ISOVUE-300) INJECTION 61% COMPARISON:  06/13/2016 CT FINDINGS: Lower chest: Small nodule at the left lung base measures 8 mm, new since prior study. Scarring in the right middle lobe anteriorly. No effusions. Heart is normal size. Calcifications in the visualized right coronary artery. Hepatobiliary: Nodular contours of the liver surface compatible with cirrhosis. Gallbladder unremarkable. No focal hepatic abnormality. Pancreas: No focal abnormality or ductal dilatation. Spleen: Mild splenomegaly with a craniocaudal length of 16.4 cm. Adrenals/Urinary Tract: Punctate nonobstructing stone in the lower pole of the left kidney. No hydronephrosis. No focal renal or adrenal abnormality. Urinary bladder unremarkable. Stomach/Bowel: Gas and fluid collection noted posterior to the lower rectum, extending around both sides of the rectum, right more prominent than left compatible with perirectal abscess. This measures 6.8 x 5.8 cm on image 98. Moderate stool burden throughout the colon. Stomach and small bowel decompressed, unremarkable. Vascular/Lymphatic: Aortic and iliac calcifications. No evidence of aneurysm or adenopathy. Reproductive:  No visible focal abnormality. Other: No free fluid or free air. Musculoskeletal: No acute bony abnormality. IMPRESSION: 6.8 x 5.8 perirectal abscess noted posterior to the rectum and wrapping around the right and left lateral margins of the rectum, right more prominent than left. Moderate stool burden. Changes of cirrhosis.  Associated splenomegaly. Left lower pole  nephrolithiasis. 8 mm left lower lobe pulmonary nodule, new since prior study. Non-contrast chest CT at 6-12 months is recommended. If the nodule is stable at time of repeat CT, then future CT at 18-24 months (from today's scan) is considered optional for low-risk patients, but is recommended for high-risk patients. This recommendation follows the consensus statement: Guidelines for Management of Incidental Pulmonary Nodules Detected on CT Images: From the Fleischner Society 2017; Radiology 2017; 284:228-243. Aortoiliac atherosclerosis, coronary artery disease. Electronically Signed   By: Rolm Baptise M.D.   On: 02/24/2017 11:34    Anti-infectives: Anti-infectives (From admission, onward)   Start     Dose/Rate Route Frequency Ordered Stop   02/24/17 1600  piperacillin-tazobactam (ZOSYN) IVPB 3.375 g     3.375 g 12.5 mL/hr over 240 Minutes Intravenous Every 8 hours 02/24/17 1509     02/24/17 1045  clindamycin (CLEOCIN) IVPB 600 mg     600 mg 100 mL/hr over 30 Minutes Intravenous  Once 02/24/17 1033 02/24/17 1133       Assessment/Plan Cirrhosis HTN DM Malnutrition Code status DNR  Perirectal abscess S/p IRRIGATION AND DEBRIDEMENT PERIRECTAL ABSCESS 2/18 Dr. Rosendo Gros - POD 1 - culture pending - penrose in place  ID - zosyn 2/18>> FEN - HH/carb mod diet VTE - SCDs Foley - none Follow up - Dr. Rosendo Gros  Plan - Start sitz baths. Continue IV zosyn while admitted; patient already has 5 day rx of augmentin that he may resume/complete once discharged. He will go home with penrose drain and f/u with Dr. Rosendo Gros in about 1 week. Patient stable for discharge from surgical standpoint. F/u and discharge instruction on AVS.   LOS: 1 day    Wellington Hampshire , [facility] 02/25/2017, 8:07 AM Pager: [phone] Consults: [phone] Mon-Fri 7:00 am-4:30 pm Sat-Sun 7:00 am-11:30 am

## 2017-02-25 NOTE — Progress Notes (Signed)
Patient discharged to home with instructions. 

## 2017-02-25 NOTE — Care Management Note (Signed)
Case Management Note  Patient Details  Name: Matthew Lewis MRN: [id number] Date of Birth: 08-31-1955  Subjective/Objective:                 Independent patient from home w wife. Will DC with penrose. Orders for [facility] RN. Patient agreeable to RN, would like to use [facility]. Referral placed to [facility]. No other CM needs   Action/Plan:   Expected Discharge Date:  02/25/17               Expected Discharge Plan:  [place]  In-House Referral:     Discharge planning Services  CM Consult  Post Acute Care Choice:    Choice offered to:  Patient  DME Arranged:    DME Agency:     HH Arranged:  RN [place] Agency:  [facility]  Status of Service:  Completed, signed off  If discussed at [place] of Stay Meetings, dates discussed:    Additional Comments:  [name], RN 02/25/2017, 11:34 AM

## 2017-02-27 DIAGNOSIS — Z85038 Personal history of other malignant neoplasm of large intestine: Secondary | ICD-10-CM | POA: Diagnosis not present

## 2017-02-27 DIAGNOSIS — E1140 Type 2 diabetes mellitus with diabetic neuropathy, unspecified: Secondary | ICD-10-CM | POA: Diagnosis not present

## 2017-02-27 DIAGNOSIS — Z48817 Encounter for surgical aftercare following surgery on the skin and subcutaneous tissue: Secondary | ICD-10-CM | POA: Diagnosis not present

## 2017-02-27 DIAGNOSIS — M1990 Unspecified osteoarthritis, unspecified site: Secondary | ICD-10-CM | POA: Diagnosis not present

## 2017-02-27 DIAGNOSIS — E440 Moderate protein-calorie malnutrition: Secondary | ICD-10-CM | POA: Diagnosis not present

## 2017-02-27 DIAGNOSIS — R911 Solitary pulmonary nodule: Secondary | ICD-10-CM | POA: Diagnosis not present

## 2017-02-27 DIAGNOSIS — I10 Essential (primary) hypertension: Secondary | ICD-10-CM | POA: Diagnosis not present

## 2017-02-27 DIAGNOSIS — K611 Rectal abscess: Secondary | ICD-10-CM | POA: Diagnosis not present

## 2017-02-27 DIAGNOSIS — Z7982 Long term (current) use of aspirin: Secondary | ICD-10-CM | POA: Diagnosis not present

## 2017-02-27 DIAGNOSIS — E871 Hypo-osmolality and hyponatremia: Secondary | ICD-10-CM | POA: Diagnosis not present

## 2017-02-27 DIAGNOSIS — Z794 Long term (current) use of insulin: Secondary | ICD-10-CM | POA: Diagnosis not present

## 2017-02-27 LAB — AEROBIC/ANAEROBIC CULTURE W GRAM STAIN (SURGICAL/DEEP WOUND)

## 2017-02-27 LAB — AEROBIC/ANAEROBIC CULTURE (SURGICAL/DEEP WOUND)

## 2017-02-27 NOTE — Consult Note (Signed)
            [facility] CM Primary Care Navigator  02/27/2017  Matthew Lewis May 11, 1955 [id number]   Attempt to seepatient at the bedsideto identify possible discharge needs but he was alreadydischargedper staff report.  Patient was discharged home with home health services.  Per MD note, patient was admitted with another reoccurrence of perirectal abscess and had incision and drainage with penrose drain left in site, per surgical team.  Primary care provider's office is listed as providing transition of care (TOC).   Patient has discharge instruction to follow-up with general surgery on 2/27.  Primary care provider's office called(Penny for Sonia Baller M.)to notify of patient's discharge and need for post hospital follow-up and transition of care (TOC). Notified of health issues needing follow-up (i.e. low blood pressure, monitoring, medication and further management).  Made aware to refer patient to [facility] CM if deemed necessary and appropriate for services.   For additional questions please contact:  Edwena Felty A. Jery Hollern, BSN, RN-BC [facility] PRIMARY CARE Navigator Cell: [phone]

## 2017-03-01 LAB — CULTURE, BLOOD (ROUTINE X 2)
Culture: NO GROWTH
Culture: NO GROWTH
SPECIAL REQUESTS: ADEQUATE
Special Requests: ADEQUATE

## 2017-03-03 DIAGNOSIS — E782 Mixed hyperlipidemia: Secondary | ICD-10-CM | POA: Diagnosis not present

## 2017-03-03 DIAGNOSIS — E1165 Type 2 diabetes mellitus with hyperglycemia: Secondary | ICD-10-CM | POA: Diagnosis not present

## 2017-03-03 DIAGNOSIS — I2510 Atherosclerotic heart disease of native coronary artery without angina pectoris: Secondary | ICD-10-CM | POA: Diagnosis not present

## 2017-03-04 DIAGNOSIS — M1990 Unspecified osteoarthritis, unspecified site: Secondary | ICD-10-CM | POA: Diagnosis not present

## 2017-03-04 DIAGNOSIS — Z48817 Encounter for surgical aftercare following surgery on the skin and subcutaneous tissue: Secondary | ICD-10-CM | POA: Diagnosis not present

## 2017-03-04 DIAGNOSIS — I10 Essential (primary) hypertension: Secondary | ICD-10-CM | POA: Diagnosis not present

## 2017-03-04 DIAGNOSIS — K611 Rectal abscess: Secondary | ICD-10-CM | POA: Diagnosis not present

## 2017-03-04 DIAGNOSIS — E440 Moderate protein-calorie malnutrition: Secondary | ICD-10-CM | POA: Diagnosis not present

## 2017-03-04 DIAGNOSIS — E1140 Type 2 diabetes mellitus with diabetic neuropathy, unspecified: Secondary | ICD-10-CM | POA: Diagnosis not present

## 2017-03-04 DIAGNOSIS — E871 Hypo-osmolality and hyponatremia: Secondary | ICD-10-CM | POA: Diagnosis not present

## 2017-03-04 DIAGNOSIS — Z794 Long term (current) use of insulin: Secondary | ICD-10-CM | POA: Diagnosis not present

## 2017-03-04 DIAGNOSIS — R911 Solitary pulmonary nodule: Secondary | ICD-10-CM | POA: Diagnosis not present

## 2017-03-04 DIAGNOSIS — Z7982 Long term (current) use of aspirin: Secondary | ICD-10-CM | POA: Diagnosis not present

## 2017-03-04 DIAGNOSIS — Z85038 Personal history of other malignant neoplasm of large intestine: Secondary | ICD-10-CM | POA: Diagnosis not present

## 2017-03-05 DIAGNOSIS — E1140 Type 2 diabetes mellitus with diabetic neuropathy, unspecified: Secondary | ICD-10-CM | POA: Diagnosis not present

## 2017-03-05 DIAGNOSIS — E1165 Type 2 diabetes mellitus with hyperglycemia: Secondary | ICD-10-CM | POA: Diagnosis not present

## 2017-03-05 DIAGNOSIS — I2510 Atherosclerotic heart disease of native coronary artery without angina pectoris: Secondary | ICD-10-CM | POA: Diagnosis not present

## 2017-03-05 DIAGNOSIS — K7460 Unspecified cirrhosis of liver: Secondary | ICD-10-CM | POA: Diagnosis not present

## 2017-03-05 DIAGNOSIS — E782 Mixed hyperlipidemia: Secondary | ICD-10-CM | POA: Diagnosis not present

## 2017-03-05 DIAGNOSIS — Z0000 Encounter for general adult medical examination without abnormal findings: Secondary | ICD-10-CM | POA: Diagnosis not present

## 2017-03-07 DIAGNOSIS — I10 Essential (primary) hypertension: Secondary | ICD-10-CM | POA: Diagnosis not present

## 2017-03-07 DIAGNOSIS — M1990 Unspecified osteoarthritis, unspecified site: Secondary | ICD-10-CM | POA: Diagnosis not present

## 2017-03-07 DIAGNOSIS — E1140 Type 2 diabetes mellitus with diabetic neuropathy, unspecified: Secondary | ICD-10-CM | POA: Diagnosis not present

## 2017-03-07 DIAGNOSIS — Z85038 Personal history of other malignant neoplasm of large intestine: Secondary | ICD-10-CM | POA: Diagnosis not present

## 2017-03-07 DIAGNOSIS — Z48817 Encounter for surgical aftercare following surgery on the skin and subcutaneous tissue: Secondary | ICD-10-CM | POA: Diagnosis not present

## 2017-03-07 DIAGNOSIS — Z7982 Long term (current) use of aspirin: Secondary | ICD-10-CM | POA: Diagnosis not present

## 2017-03-07 DIAGNOSIS — Z794 Long term (current) use of insulin: Secondary | ICD-10-CM | POA: Diagnosis not present

## 2017-03-07 DIAGNOSIS — E440 Moderate protein-calorie malnutrition: Secondary | ICD-10-CM | POA: Diagnosis not present

## 2017-03-07 DIAGNOSIS — R911 Solitary pulmonary nodule: Secondary | ICD-10-CM | POA: Diagnosis not present

## 2017-03-07 DIAGNOSIS — E871 Hypo-osmolality and hyponatremia: Secondary | ICD-10-CM | POA: Diagnosis not present

## 2017-03-07 DIAGNOSIS — K611 Rectal abscess: Secondary | ICD-10-CM | POA: Diagnosis not present

## 2017-03-10 DIAGNOSIS — Z794 Long term (current) use of insulin: Secondary | ICD-10-CM | POA: Diagnosis not present

## 2017-03-10 DIAGNOSIS — Z48817 Encounter for surgical aftercare following surgery on the skin and subcutaneous tissue: Secondary | ICD-10-CM | POA: Diagnosis not present

## 2017-03-26 DIAGNOSIS — K611 Rectal abscess: Secondary | ICD-10-CM | POA: Diagnosis not present

## 2017-03-26 DIAGNOSIS — Z48817 Encounter for surgical aftercare following surgery on the skin and subcutaneous tissue: Secondary | ICD-10-CM | POA: Diagnosis not present

## 2017-03-26 DIAGNOSIS — I10 Essential (primary) hypertension: Secondary | ICD-10-CM | POA: Diagnosis not present

## 2017-03-26 DIAGNOSIS — E1140 Type 2 diabetes mellitus with diabetic neuropathy, unspecified: Secondary | ICD-10-CM | POA: Diagnosis not present

## 2017-04-02 ENCOUNTER — Other Ambulatory Visit (INDEPENDENT_AMBULATORY_CARE_PROVIDER_SITE_OTHER): Payer: Medicare Other

## 2017-04-02 ENCOUNTER — Ambulatory Visit: Payer: Medicare Other | Admitting: Gastroenterology

## 2017-04-02 ENCOUNTER — Encounter: Payer: Self-pay | Admitting: Gastroenterology

## 2017-04-02 VITALS — BP 120/74 | HR 68 | Ht 71.0 in | Wt 244.6 lb

## 2017-04-02 DIAGNOSIS — K7469 Other cirrhosis of liver: Secondary | ICD-10-CM | POA: Diagnosis not present

## 2017-04-02 DIAGNOSIS — Z85038 Personal history of other malignant neoplasm of large intestine: Secondary | ICD-10-CM | POA: Diagnosis not present

## 2017-04-02 DIAGNOSIS — Z86010 Personal history of colonic polyps: Secondary | ICD-10-CM

## 2017-04-02 LAB — COMPREHENSIVE METABOLIC PANEL
ALK PHOS: 71 U/L (ref 39–117)
ALT: 39 U/L (ref 0–53)
AST: 29 U/L (ref 0–37)
Albumin: 4.2 g/dL (ref 3.5–5.2)
BILIRUBIN TOTAL: 0.5 mg/dL (ref 0.2–1.2)
BUN: 13 mg/dL (ref 6–23)
CO2: 24 meq/L (ref 19–32)
Calcium: 9.5 mg/dL (ref 8.4–10.5)
Chloride: 101 mEq/L (ref 96–112)
Creatinine, Ser: 0.77 mg/dL (ref 0.40–1.50)
GFR: 108.29 mL/min (ref >60.00)
GLUCOSE: 120 mg/dL — AB (ref 70–99)
Potassium: 4.2 mEq/L (ref 3.5–5.1)
Sodium: 134 mEq/L — ABNORMAL LOW (ref 135–145)
TOTAL PROTEIN: 7.8 g/dL (ref 6.0–8.3)

## 2017-04-02 LAB — PROTIME-INR
INR: 1.2 ratio — AB (ref 0.8–1.0)
PROTHROMBIN TIME: 12.9 s (ref 9.6–13.1)

## 2017-04-02 LAB — CBC WITH DIFFERENTIAL/PLATELET
BASOS ABS: 0 10*3/uL (ref 0.0–0.1)
Basophils Relative: 0.6 % (ref 0.0–3.0)
EOS ABS: 0.4 10*3/uL (ref 0.0–0.7)
Eosinophils Relative: 4.5 % (ref 0.0–5.0)
HCT: 44.5 % (ref 39.0–52.0)
Hemoglobin: 15.4 g/dL (ref 13.0–17.0)
LYMPHS ABS: 1.9 10*3/uL (ref 0.7–4.0)
Lymphocytes Relative: 23.7 % (ref 12.0–46.0)
MCHC: 34.6 g/dL (ref 30.0–36.0)
MCV: 93 fl (ref 78.0–100.0)
MONO ABS: 0.8 10*3/uL (ref 0.1–1.0)
MONOS PCT: 10.2 % (ref 3.0–12.0)
NEUTROS ABS: 4.9 10*3/uL (ref 1.4–7.7)
NEUTROS PCT: 61 % (ref 43.0–77.0)
PLATELETS: 249 10*3/uL (ref 150.0–400.0)
RBC: 4.79 Mil/uL (ref 4.22–5.81)
RDW: 14.4 % (ref 11.5–15.5)
WBC: 8.1 10*3/uL (ref 4.0–10.5)

## 2017-04-02 NOTE — Patient Instructions (Signed)
Go to the basement for labs today  You recall Colonoscpy/Endoscopy is 01/2018  Follow up in 1 year  If you are age 64 or older, your body mass index should be between 23-30. Your Body mass index is 34.11 kg/m. If this is out of the aforementioned range listed, please consider follow up with your Primary Care Provider.  If you are age 89 or younger, your body mass index should be between 19-25. Your Body mass index is 34.11 kg/m. If this is out of the aformentioned range listed, please consider follow up with your Primary Care Provider.

## 2017-04-02 NOTE — Progress Notes (Signed)
Matthew Lewis    [id number]    1955-12-20  Primary Care Physician:Ramachandran, [name], MD  Referring Physician: [name], [place] [address] [place], [place] [address]  Chief complaint: Cirrhosis  HPI:  62 year old male with history of colon cancer status post resection in remission, alcohol abuse abstinent since 5374, nonalcoholic steatohepatitis progressed to cirrhosis here for follow-up visit.  He is doing well overall.  Continues to abstain from alcohol.  Denies any nausea, vomiting, abdominal pain, melena or bright red blood per rectum     Outpatient Encounter Medications as of 04/02/2017  Medication Sig  . amoxicillin-clavulanate (AUGMENTIN) 500-125 MG tablet Take 1 tablet by mouth 2 (two) times daily. for 10 days  . aspirin EC 81 MG tablet Take 81 mg by mouth daily.  Marland Kitchen atorvastatin (LIPITOR) 40 MG tablet Take 1 tablet (40 mg total) by mouth daily at 6 PM.  . cyclobenzaprine (FLEXERIL) 10 MG tablet Take 10 mg by mouth 3 (three) times daily as needed for muscle spasms.  Marland Kitchen docusate sodium (COLACE) 100 MG capsule Take 1 capsule (100 mg total) by mouth 2 (two) times daily.  . DULoxetine (CYMBALTA) 30 MG capsule Take 30 mg by mouth 2 (two) times daily.  . empagliflozin (JARDIANCE) 10 MG TABS tablet Take 10 mg by mouth at bedtime.   . gabapentin (NEURONTIN) 400 MG capsule Take 400 mg by mouth 2 (two) times daily.  Marland Kitchen glipiZIDE (GLUCOTROL) 5 MG tablet Take 5 mg by mouth 2 (two) times daily before a meal.  . ibuprofen (ADVIL,MOTRIN) 200 MG tablet Take 200 mg by mouth every 6 (six) hours as needed for moderate pain.  . Insulin Detemir (LEVEMIR FLEXPEN) 100 UNIT/ML Pen Inject 10-60 Units into the skin 2 (two) times daily. Take 60 units in the am and Take 10 units in the pm  . lisinopril (PRINIVIL,ZESTRIL) 40 MG tablet Take 40 mg by mouth daily.  . metFORMIN (GLUCOPHAGE) 1000 MG tablet Take 1,000 mg by mouth 2 (two) times daily with a meal.  .  Multiple Vitamin (MULTIVITAMIN) tablet Take 1 tablet by mouth daily.   No facility-administered encounter medications on file as of 04/02/2017.     Allergies as of 04/02/2017 - Review Complete 02/24/2017  Allergen Reaction Noted  . Other Other (See Comments) 05/15/2012    Past Medical History:  Diagnosis Date  . Abscess of anal and rectal regions    horse shoe abscess  . Alcoholic cirrhosis ([place])   . Arthritis    "everywhere; especially in my spine"  . Cirrhosis ([place])   . Colon cancer ([place]) 05/2005  . DM type 2 with diabetic peripheral neuropathy (HCC)    left foot  . Fatigue   . Fistula, anal    multiple  . History of substance abuse    last alcohol was 08/2011; last marijuania was 08/2011  . Poor circulation   . PTSD (post-traumatic stress disorder)    "severe"  . Sleep deprivation     Past Surgical History:  Procedure Laterality Date  . ABSCESS DRAINAGE     "probably 15-20 so far at least; rectal"  . ANAL FISTULECTOMY  12/13/1998  . ANTERIOR FUSION CERVICAL SPINE  2010   "triple"; "did 2 surgeries on the same day in 8270 due to complications"  . CORONARY ANGIOPLASTY  2001  . detached muscle  2010   right chest; "after cervical fusion complications"  . ELBOW SURGERY  2007   "  cut out part of a muscle"; left  . EXAMINATION UNDER ANESTHESIA  03/22/2005   fistula  . EXAMINATION UNDER ANESTHESIA  06/06/2011   Procedure: EXAM UNDER ANESTHESIA;  Surgeon: Stark Klein, MD;  Location: [facility];  Service: General;  Laterality: N/A;  . HEMICOLECTOMY  06/06/2005   left  . INCISION AND DRAINAGE PERIRECTAL ABSCESS  03/30/2005  . INCISION AND DRAINAGE PERIRECTAL ABSCESS  12/13/2004  . INCISION AND DRAINAGE PERIRECTAL ABSCESS  07/14/2001  . INCISION AND DRAINAGE PERIRECTAL ABSCESS  08/12/2010   horseshoe abscess; Dr Redmond Pulling  . INCISION AND DRAINAGE PERIRECTAL ABSCESS  06/06/2011   Procedure: IRRIGATION AND DEBRIDEMENT PERIRECTAL ABSCESS;  Surgeon: Stark Klein, MD;  Location: [facility];  Service:  General;  Laterality: N/A;  . INCISION AND DRAINAGE PERIRECTAL ABSCESS N/A 02/24/2017   Procedure: IRRIGATION AND DEBRIDEMENT PERIRECTAL ABSCESS;  Surgeon: Ralene Ok, MD;  Location: [facility];  Service: General;  Laterality: N/A;  . LUMBAR [place]  . PERCUTANEOUS PINNING PHALANX FRACTURE OF HAND  ~ 2008   "plates in 2 places"; right  . THORACIC DISCECTOMY  1990's    Family History  Problem Relation Age of Onset  . Heart disease Father   . Heart disease Mother   . Cancer Mother   . Cancer Sister   . Heart disease Brother     Social History   Socioeconomic History  . Marital status: Married    Spouse name: Not on file  . Number of children: Not on file  . Years of education: Not on file  . Highest education level: Not on file  Occupational History  . Occupation: works at [employer]  . Financial resource strain: Not on file  . Food insecurity:    Worry: Not on file    Inability: Not on file  . Transportation needs:    Medical: Not on file    Non-medical: Not on file  Tobacco Use  . Smoking status: Former Smoker    Packs/day: 1.00    Years: 10.00    Pack years: 10.00    Types: Cigarettes    Last attempt to quit: 01/08/1991    Years since quitting: 26.2  . Smokeless tobacco: Never Used  Substance and Sexual Activity  . Alcohol use: Yes    Comment: 06/05/11 "drank enough when I was younger to last me my whole life; don't remember when I had my last drink, maybe 2012"  . Drug use: Yes    Types: Oxycodone, Marijuana    Comment: 06/05/11 "pot head in my younger days; 2009 dr had me on oxycodone and valium for almost 65yr  . Sexual activity: Not Currently  Lifestyle  . Physical activity:    Days per week: Not on file    Minutes per session: Not on file  . Stress: Not on file  Relationships  . Social connections:    Talks on phone: Not on file    Gets together: Not on file    Attends religious service: Not on file    Active member of club  or organization: Not on file    Attends meetings of clubs or organizations: Not on file    Relationship status: Not on file  . Intimate partner violence:    Fear of current or ex partner: Not on file    Emotionally abused: Not on file    Physically abused: Not on file    Forced sexual activity: Not on file  Other Topics  Concern  . Not on file  Social History Narrative  . Not on file      Review of systems: Review of Systems  Constitutional: Negative for fever and chills.  HENT: Positive for tinnitus.   Eyes: Negative for blurred vision.  Respiratory: Negative for cough, shortness of breath and wheezing.   Cardiovascular: Negative for chest pain and palpitations.  Gastrointestinal: as per HPI Genitourinary: Negative for dysuria, urgency, frequency and hematuria.  Musculoskeletal: Positive for myalgias, back pain and joint pain.  Skin: Negative for itching and rash.  Neurological: Negative for dizziness, tremors,  seizures and loss of consciousness. Positive for nerve damage of brachial plexus and associated focal weakness Endo/Heme/Allergies: Positive for seasonal allergies.  Psychiatric/Behavioral: Negative for depression, suicidal ideas and hallucinations.  All other systems reviewed and are negative.   Physical Exam: Vitals:   04/02/17 1322  BP: 120/74  Pulse: 68   Body mass index is 34.11 kg/m. Gen:      No acute distress HEENT:  EOMI, sclera anicteric Neck:     No masses; no thyromegaly Lungs:    Clear to auscultation bilaterally; normal respiratory effort CV:         Regular rate and rhythm; no murmurs Abd:      + bowel sounds; soft, non-tender; no palpable masses, no distension Ext:    No edema; adequate peripheral perfusion Skin:      Warm and dry; no rash Neuro: alert and oriented x 3 Psych: normal mood and affect  Data Reviewed:  Reviewed labs, radiology imaging, old records and pertinent past GI work up  EGD June 2018 showed small/trace esophageal  varices, small clean based duodenal ulcers and severe erosive gastritis likely secondary to nsaids, H. pylori negative Colonoscopy in January 2015 by Dr. Deatra Ina with removal of 1 diminutive tubular adenoma  Assessment and Plan/Recommendations:  62 year old male with history of obesity, diabetes, colon cancer status post resection in clinical remission, alcoholic and nonalcoholic steatohepatitis with progression to cirrhosis Viral hepatitis negative and workup negative for any other underlying chronic liver disease. MELD remains low at 6 Follow-up CBC, CMP and PT/INR  HCC screening: Recent CT abdomen pelvis February 2019 negative for any new liver lesions Follow-up AFP  No evidence of ascites or peripheral edema based on exam  Trace esophageal varices on EGD June 2018 Due for surveillance EGD in 2020  History of colon cancer and adenomatous colon polyps, due for surveillance colonoscopy in January 2020   K. Denzil Magnuson , MD [phone] Mon-Fri 8a-5p [phone] after 5p, weekends, holidays  CC: [name], MD

## 2017-04-03 LAB — AFP TUMOR MARKER: AFP TUMOR MARKER: 2.2 ng/mL (ref <6.1)

## 2017-04-07 ENCOUNTER — Encounter (HOSPITAL_COMMUNITY): Payer: Self-pay | Admitting: Emergency Medicine

## 2017-04-07 ENCOUNTER — Emergency Department (HOSPITAL_COMMUNITY)
Admission: EM | Admit: 2017-04-07 | Discharge: 2017-04-07 | Disposition: A | Discharge status: Home / Self Care | Payer: Medicare Other | Attending: Emergency Medicine | Admitting: Emergency Medicine

## 2017-04-07 DIAGNOSIS — E119 Type 2 diabetes mellitus without complications: Secondary | ICD-10-CM | POA: Insufficient documentation

## 2017-04-07 DIAGNOSIS — S61011A Laceration without foreign body of right thumb without damage to nail, initial encounter: Secondary | ICD-10-CM

## 2017-04-07 DIAGNOSIS — Z7982 Long term (current) use of aspirin: Secondary | ICD-10-CM | POA: Diagnosis not present

## 2017-04-07 DIAGNOSIS — S6991XA Unspecified injury of right wrist, hand and finger(s), initial encounter: Secondary | ICD-10-CM | POA: Diagnosis present

## 2017-04-07 DIAGNOSIS — Z23 Encounter for immunization: Secondary | ICD-10-CM | POA: Insufficient documentation

## 2017-04-07 DIAGNOSIS — W319XXA Contact with unspecified machinery, initial encounter: Secondary | ICD-10-CM | POA: Diagnosis not present

## 2017-04-07 DIAGNOSIS — Y929 Unspecified place or not applicable: Secondary | ICD-10-CM | POA: Insufficient documentation

## 2017-04-07 DIAGNOSIS — Z87891 Personal history of nicotine dependence: Secondary | ICD-10-CM | POA: Diagnosis not present

## 2017-04-07 DIAGNOSIS — Z79899 Other long term (current) drug therapy: Secondary | ICD-10-CM | POA: Diagnosis not present

## 2017-04-07 DIAGNOSIS — Y999 Unspecified external cause status: Secondary | ICD-10-CM | POA: Diagnosis not present

## 2017-04-07 DIAGNOSIS — Z794 Long term (current) use of insulin: Secondary | ICD-10-CM | POA: Insufficient documentation

## 2017-04-07 DIAGNOSIS — Y939 Activity, unspecified: Secondary | ICD-10-CM | POA: Insufficient documentation

## 2017-04-07 DIAGNOSIS — I2510 Atherosclerotic heart disease of native coronary artery without angina pectoris: Secondary | ICD-10-CM | POA: Diagnosis not present

## 2017-04-07 DIAGNOSIS — I10 Essential (primary) hypertension: Secondary | ICD-10-CM | POA: Insufficient documentation

## 2017-04-07 MED ORDER — LIDOCAINE HCL 2 % IJ SOLN
10.0000 mL | Freq: Once | INTRAMUSCULAR | Status: AC
  Administered 2017-04-07: 200 mg via INTRADERMAL
  Filled 2017-04-07: qty 20

## 2017-04-07 MED ORDER — TETANUS-DIPHTH-ACELL PERTUSSIS 5-2.5-18.5 LF-MCG/0.5 IM SUSP
0.5000 mL | Freq: Once | INTRAMUSCULAR | Status: AC
  Administered 2017-04-07: 0.5 mL via INTRAMUSCULAR
  Filled 2017-04-07: qty 0.5

## 2017-04-07 NOTE — Discharge Instructions (Addendum)
Gently wash with warm soapy water daily.    Return to have stitches removed in 7 days.  Return to the emergency department if you notice any signs of infection including swelling, redness or fever.

## 2017-04-07 NOTE — ED Provider Notes (Addendum)
[facility] DEPT Provider Note   CSN: [id number] Arrival date & time: 04/07/17  1253     History   Chief Complaint Chief Complaint  Patient presents with  . Laceration    HPI Matthew Lewis is a 62 y.o. male.  HPI  Matthew Lewis is a 62 year old male with a history of cirrhosis, type 2 diabetes, hypertension, hyperlipidemia, PTSD who presents to the emergency department for evaluation of right thumb laceration.  Patient reports that he was using a metal grinder around 1130 today when it accidentally slipped and nicked the pad of his right thumb.  No nail damage.  States that he initially felt pain, but this has since resolved.  Bleeding controlled with pressure.  Denies fevers, chills, numbness, weakness.  Denies wounds elsewhere.  Is unsure of his last Tdap vaccination.  Past Medical History:  Diagnosis Date  . Abscess of anal and rectal regions    horse shoe abscess  . Alcoholic cirrhosis ([place])   . Arthritis    "everywhere; especially in my spine"  . Cirrhosis ([place])   . Colon cancer ([place]) 05/2005  . DM type 2 with diabetic peripheral neuropathy (HCC)    left foot  . Fatigue   . Fistula, anal    multiple  . History of substance abuse    last alcohol was 08/2011; last marijuania was 08/2011  . Poor circulation   . PTSD (post-traumatic stress disorder)    "severe"  . Sleep deprivation     Patient Active Problem List   Diagnosis Date Noted  . Perirectal abscess 02/24/2017  . Moderate protein-calorie malnutrition ([place]) 02/24/2017  . Sepsis ([place]) 02/24/2017  . Pulmonary nodule 02/24/2017  . Alcoholic cirrhosis of liver without ascites ([place]) 02/24/2017  . Duodenitis 06/13/2016  . Abnormal LFTs 06/13/2016  . Hyponatremia 06/12/2016  . Nausea & vomiting 06/12/2016  . Thrombocytopenia ([place]) 06/12/2016  . Acute gastroenteritis 06/12/2016  . PTSD (post-traumatic stress disorder) 12/02/2012  . Hx of substance abuse- clean 15 months, in AA  12/02/2012  . Chest pain with moderate risk of acute coronary syndrome 11/30/2012  . Obesity 11/30/2012  . Hypertension 11/30/2012  . Hyperlipemia 11/30/2012  . Diabetes mellitus ([place]) 11/30/2012  . CAD (coronary artery disease)- PCI 2001- no evaluation since 11/30/2012    Past Surgical History:  Procedure Laterality Date  . ABSCESS DRAINAGE     "probably 15-20 so far at least; rectal"  . ANAL FISTULECTOMY  12/13/1998  . ANTERIOR FUSION CERVICAL SPINE  2010   "triple"; "did 2 surgeries on the same day in 1102 due to complications"  . CORONARY ANGIOPLASTY  2001  . detached muscle  2010   right chest; "after cervical fusion complications"  . ELBOW SURGERY  2007   "cut out part of a muscle"; left  . EXAMINATION UNDER ANESTHESIA  03/22/2005   fistula  . EXAMINATION UNDER ANESTHESIA  06/06/2011   Procedure: EXAM UNDER ANESTHESIA;  Surgeon: Stark Klein, MD;  Location: [facility];  Service: General;  Laterality: N/A;  . HEMICOLECTOMY  06/06/2005   left  . INCISION AND DRAINAGE PERIRECTAL ABSCESS  03/30/2005  . INCISION AND DRAINAGE PERIRECTAL ABSCESS  12/13/2004  . INCISION AND DRAINAGE PERIRECTAL ABSCESS  07/14/2001  . INCISION AND DRAINAGE PERIRECTAL ABSCESS  08/12/2010   horseshoe abscess; Dr Redmond Pulling  . INCISION AND DRAINAGE PERIRECTAL ABSCESS  06/06/2011   Procedure: IRRIGATION AND DEBRIDEMENT PERIRECTAL ABSCESS;  Surgeon: Stark Klein, MD;  Location: [facility];  Service: General;  Laterality:  N/A;  . INCISION AND DRAINAGE PERIRECTAL ABSCESS N/A 02/24/2017   Procedure: IRRIGATION AND DEBRIDEMENT PERIRECTAL ABSCESS;  Surgeon: Ralene Ok, MD;  Location: [facility];  Service: General;  Laterality: N/A;  . LUMBAR [place]  . PERCUTANEOUS PINNING PHALANX FRACTURE OF HAND  ~ 2008   "plates in 2 places"; right  . THORACIC DISCECTOMY  1990's        Home Medications    Prior to Admission medications   Medication Sig Start Date End Date Taking? Authorizing Provider  aspirin EC 81 MG tablet  Take 81 mg by mouth daily.    [provider]  atorvastatin (LIPITOR) 40 MG tablet Take 1 tablet (40 mg total) by mouth daily at 6 PM. 12/02/12   Viyuoh, Adeline C, MD  docusate sodium (COLACE) 100 MG capsule Take 1 capsule (100 mg total) by mouth 2 (two) times daily. 02/25/17   Bonnell Public, MD  DULoxetine (CYMBALTA) 30 MG capsule Take 30 mg by mouth 2 (two) times daily. 02/05/17   [provider]  empagliflozin (JARDIANCE) 10 MG TABS tablet Take 10 mg by mouth at bedtime.     [provider]  gabapentin (NEURONTIN) 400 MG capsule Take 400 mg by mouth 2 (two) times daily.    [provider]  glipiZIDE (GLUCOTROL) 5 MG tablet Take 5 mg by mouth 2 (two) times daily before a meal.    [provider]  ibuprofen (ADVIL,MOTRIN) 200 MG tablet Take 200 mg by mouth every 6 (six) hours as needed for moderate pain.    [provider]  Insulin Detemir (LEVEMIR FLEXPEN) 100 UNIT/ML Pen Inject 10-60 Units into the skin 2 (two) times daily. Take 60 units in the am and Take 10 units in the pm    [provider]  metFORMIN (GLUCOPHAGE) 1000 MG tablet Take 1,000 mg by mouth 2 (two) times daily with a meal.    [provider]  Multiple Vitamin (MULTIVITAMIN) tablet Take 1 tablet by mouth daily.    [provider]    Family History Family History  Problem Relation Age of Onset  . Heart disease Father   . Heart disease Mother   . Cancer Mother   . Cancer Sister   . Heart disease Brother     Social History Social History   Tobacco Use  . Smoking status: Former Smoker    Packs/day: 1.00    Years: 10.00    Pack years: 10.00    Types: Cigarettes    Last attempt to quit: 01/08/1991    Years since quitting: 26.2  . Smokeless tobacco: Never Used  Substance Use Topics  . Alcohol use: Yes    Comment: 06/05/11 "drank enough when I was younger to last me my whole life; don't remember when I had my last drink, maybe 2012"  .  Drug use: Yes    Types: Oxycodone, Marijuana    Comment: 06/05/11 "pot head in my younger days; 2009 dr had me on oxycodone and valium for almost 45yr     Allergies   Other   Review of Systems Review of Systems  Constitutional: Negative for chills and fever.  Skin: Positive for wound (1cm laceration on pad of right thumb). Negative for color change.  Neurological: Negative for weakness and numbness.     Physical Exam Updated Vital Signs BP (!) 144/81 (BP Location: Left Arm)   Pulse (!) 115   Temp (!) 97.4 F (36.3 C) (Oral)  Resp 20   Ht 5' 11"  (1.803 m)   Wt 110.7 kg (244 lb)   SpO2 97%   BMI 34.03 kg/m   Physical Exam  Constitutional: He is oriented to person, place, and time. He appears well-developed and well-nourished. No distress.  HENT:  Head: Normocephalic and atraumatic.  Eyes: Right eye exhibits no discharge. Left eye exhibits no discharge.  Pulmonary/Chest: Effort normal. No respiratory distress.  Musculoskeletal:  Approximately 1 cm horizontal laceration noted on the pad of the right thumb.  No nailbed damage.  Bleeding controlled.  Note surrounding erythema or warmth.  No overlying tenderness.  Full active flexion and extension of the IP joint without pain.  Cap refill <2sec. Sensation to light touch intact beyond laceration.  Neurological: He is alert and oriented to person, place, and time. Coordination normal.  Skin: Skin is warm and dry. Capillary refill takes less than 2 seconds. He is not diaphoretic.  Psychiatric: He has a normal mood and affect. His behavior is normal.  Nursing note and vitals reviewed.    ED Treatments / Results  Labs (all labs ordered are listed, but only abnormal results are displayed) Labs Reviewed - No data to display  EKG None  Radiology No results found.  Procedures .Marland KitchenLaceration Repair Date/Time: 04/07/2017 5:05 PM Performed by: Glyn Ade, PA-C Authorized by: Glyn Ade, PA-C   Consent:     Consent obtained:  Verbal and emergent situation   Consent given by:  Patient   Risks discussed:  Infection, pain, poor cosmetic result, poor wound healing and retained foreign body   Alternatives discussed:  No treatment and delayed treatment Anesthesia (see MAR for exact dosages):    Anesthesia method:  Nerve block   Block location:  Right thumb   Block needle gauge:  25 G   Block anesthetic:  Lidocaine 2% w/o epi   Block injection procedure:  Anatomic landmarks identified and introduced needle   Block outcome:  Anesthesia achieved Laceration details:    Location:  Finger   Finger location:  R thumb   Length (cm):  1   Depth (mm):  5 Repair type:    Repair type:  Simple Pre-procedure details:    Preparation:  Patient was prepped and draped in usual sterile fashion Exploration:    Hemostasis achieved with:  Direct pressure   Wound exploration: wound explored through full range of motion and entire depth of wound probed and visualized     Contaminated: no   Treatment:    Area cleansed with:  Betadine   Amount of cleaning:  Extensive   Irrigation solution:  Sterile saline   Irrigation volume:  200ccs   Irrigation method:  Pressure wash Skin repair:    Repair method:  Sutures   Suture size:  5-0   Suture material:  Prolene   Suture technique:  Simple interrupted   Number of sutures:  3 Approximation:    Approximation:  Close Post-procedure details:    Dressing:  Open (no dressing)   Patient tolerance of procedure:  Tolerated well, no immediate complications   (including critical care time)  Medications Ordered in ED Medications  lidocaine (XYLOCAINE) 2 % (with pres) injection 200 mg (200 mg Intradermal Given 04/07/17 1634)  Tdap (BOOSTRIX) injection 0.5 mL (0.5 mLs Intramuscular Given 04/07/17 1632)     Initial Impression / Assessment and Plan / ED Course  I have reviewed the triage vital signs and the nursing notes.  Pertinent labs & imaging results that  were  available during my care of the patient were reviewed by me and considered in my medical decision making (see chart for details).     Pressure irrigation performed. Wound explored and base of wound visualized in a bloodless field without evidence of foreign body.  Laceration occurred < 8 hours prior to repair which was well tolerated. Tdap updated. Wound does not appear to be contaminated. Pt discharged without antibiotics. Discussed suture home care with patient and answered questions. Pt to follow-up for wound check and suture removal in 7 days; they are to return to the ED sooner for signs of infection.  His blood pressure was elevated in the emergency department today, counseled him to have this rechecked with his primary care doctor.  Patient agrees and voiced understanding to this plan.  Pt is hemodynamically stable with no complaints prior to dc.   Final Clinical Impressions(s) / ED Diagnoses   Final diagnoses:  Laceration of right thumb without foreign body without damage to nail, initial encounter    ED Discharge Orders    None       Glyn Ade, PA-C 04/07/17 1705    Glyn Ade, PA-C 04/07/17 1707    Valarie Merino, MD 04/07/17 [phone]

## 2017-04-07 NOTE — ED Triage Notes (Signed)
Patient reports cutting right thumb with grinder today. Approx one inch laceration to right thumb. Bleeding controlled. Reports tetanus shot within the last five years.

## 2017-04-08 ENCOUNTER — Other Ambulatory Visit: Payer: Self-pay

## 2017-08-11 DIAGNOSIS — K6289 Other specified diseases of anus and rectum: Secondary | ICD-10-CM | POA: Diagnosis not present

## 2017-08-13 ENCOUNTER — Other Ambulatory Visit: Payer: Self-pay | Admitting: General Surgery

## 2017-08-13 DIAGNOSIS — L509 Urticaria, unspecified: Secondary | ICD-10-CM | POA: Diagnosis not present

## 2017-08-13 DIAGNOSIS — K6289 Other specified diseases of anus and rectum: Secondary | ICD-10-CM

## 2017-08-19 ENCOUNTER — Ambulatory Visit
Admission: RE | Admit: 2017-08-19 | Discharge: 2017-08-19 | Disposition: A | Discharge status: Home / Self Care | Payer: Medicare Other | Source: Ambulatory Visit | Attending: General Surgery | Admitting: General Surgery

## 2017-08-19 DIAGNOSIS — K611 Rectal abscess: Secondary | ICD-10-CM | POA: Diagnosis not present

## 2017-08-19 DIAGNOSIS — K6289 Other specified diseases of anus and rectum: Secondary | ICD-10-CM

## 2017-08-19 MED ORDER — GADOBENATE DIMEGLUMINE 529 MG/ML IV SOLN
20.0000 mL | Freq: Once | INTRAVENOUS | Status: AC | PRN
  Administered 2017-08-19: 20 mL via INTRAVENOUS

## 2017-08-26 ENCOUNTER — Ambulatory Visit: Payer: Self-pay | Admitting: General Surgery

## 2017-08-26 DIAGNOSIS — K603 Anal fistula: Secondary | ICD-10-CM | POA: Diagnosis not present

## 2017-08-26 NOTE — H&P (View-Only) (Signed)
History of Present Illness Matthew Ruff MD; 3/42/8768 9:34 AM) The patient is a 62 year old male presenting for a post-operative visit. 62 year old male status post I&D of right perirectal abscess by Dr. Rosendo Gros on 02/24/17. His Penrose drain was removed in the office on 03/05/17. At that time he was advised to continue doing twice daily wet-to-dry dressing changes. Patient followed up with me once afterwards and the wound was healed. He is presenting today with concerns of bleeding from his incision which has occurred monthly for the last 3 months and lasts 2-3 days. He has not noticed any bleeding today. Rates pain 1/10. Denies any chronic diarrhea or loose stools  He states he has had several abscess in his perianal region over the past few years, some requiring I&D and some resolving spontaneously. He also underwent a fistulotomy in 2012   Problem List/Past Medical Matthew Ruff, MD; 01/22/7260 9:34 AM) Kathi Der ABSCESS (K61.1) PERIRECTAL INFLAMMATION (K62.89) ANAL FISTULA (K60.3)  Past Surgical History Matthew Ruff, MD; 0/35/5974 9:34 AM) Orpah Cobb Fissure Repair Colon Polyp Removal - Colonoscopy Colon Removal - Partial Spinal Surgery - Lower Back Spinal Surgery - Neck Spinal Surgery Midback  Diagnostic Studies History Matthew Ruff, MD; 1/63/8453 9:34 AM) Colonoscopy 1-5 years ago  Allergies (Tanisha A. Owens Shark, [place]; 08/26/2017 9:07 AM) No Known Drug Allergies [03/05/2017]: Allergies Reconciled  Medication History (Tanisha A. Brown, [place]; 08/26/2017 9:07 AM) AmLODIPine Besylate (10MG Tablet, Oral) Active. Atorvastatin Calcium (40MG Tablet, Oral) Active. DULoxetine HCl (30MG Capsule DR Part, Oral) Active. GlipiZIDE (5MG Tablet, Oral) Active. Lisinopril (40MG Tablet, Oral) Active. PredniSONE (10MG Tablet, Oral) Active. Medications Reconciled  Social History Matthew Ruff, MD; 6/46/8032 9:34 AM) Alcohol use Remotely quit alcohol use. Caffeine use  Carbonated beverages, Coffee, Tea. Illicit drug use Remotely quit drug use. Tobacco use Former smoker.  Family History Matthew Ruff, MD; 01/29/4823 9:34 AM) Alcohol Abuse Brother, Father. Arthritis Mother. Breast Cancer Sister. Depression Mother. Heart Disease Mother.  Other Problems Matthew Ruff, MD; 0/03/7046 9:34 AM) Alcohol Abuse Arthritis Back Pain Cancer Cirrhosis Of Liver Diabetes Mellitus High blood pressure Hypercholesterolemia     Review of Systems Matthew Ruff MD; 8/89/1694 9:34 AM) General Present- Fatigue. Not Present- Appetite Loss, Chills, Fever, Night Sweats, Weight Gain and Weight Loss. Skin Not Present- Change in Wart/Mole, Dryness, Hives, Jaundice, New Lesions, Non-Healing Wounds, Rash and Ulcer. HEENT Present- Ringing in the Ears and Wears glasses/contact lenses. Not Present- Earache, Hearing Loss, Hoarseness, Nose Bleed, Oral Ulcers, Seasonal Allergies, Sinus Pain, Sore Throat, Visual Disturbances and Yellow Eyes. Respiratory Not Present- Bloody sputum, Chronic Cough, Difficulty Breathing, Snoring and Wheezing. Breast Not Present- Breast Mass, Breast Pain, Nipple Discharge and Skin Changes. Cardiovascular Not Present- Chest Pain, Difficulty Breathing Lying Down, Leg Cramps, Palpitations, Rapid Heart Rate, Shortness of Breath and Swelling of Extremities. Gastrointestinal Present- Rectal Pain. Not Present- Abdominal Pain, Bloating, Bloody Stool, Change in Bowel Habits, Chronic diarrhea, Constipation, Difficulty Swallowing, Excessive gas, Gets full quickly at meals, Hemorrhoids, Indigestion, Nausea and Vomiting.  Vitals (Tanisha A. Brown RMA; 08/26/2017 9:06 AM) 08/26/2017 9:06 AM Weight: 241.8 lb Height: 70in Body Surface Area: 2.26 m Body Mass Index: 34.69 kg/m  Temp.: 97.22F  Pulse: 109 (Regular)  BP: 134/86 (Sitting, Left Arm, Standard)      Physical Exam Matthew Ruff MD; 05/09/8880 9:35 AM)  The physical exam  findings are as follows: Note:GENERAL: Well-developed, well nourished male in no acute distress  EYES: No scleral icterus Pupils equal, lids normal  EXTERNAL EARS: Intact, no masses or lesions EXTERNAL  NOSE: Intact, no masses or lesions MOUTH: Lips - no lesions Dentition - normal for age  RESPIRATORY: Normal effort, no use of accessory muscles  MUSCULOSKELETAL: Normal gait Grossly normal ROM upper extremities Grossly normal ROM lower extremities  SKIN: Warm and dry Not diaphoretic  PSYCHIATRIC: Normal judgement and insight Normal mood and affect Alert, oriented x 3  Male Genitourinary Note: Right perirectal abscess incision site with no surrounding erythema, induration, or fluctuance There is an erythematous lesion next to the scar which the patient confirms that the spot of drainage. It is not open or draining at the moment. There is no fluctuation underneath. I cannot palpate a palpable cord.    Assessment & Plan Matthew Ruff MD; 4/32/7614 9:33 AM)  ANAL FISTULA (K60.3) Impression: 62yo M with recurrent anal abscesses with a h/o fistulotomy in 2012. He has had multiple abscesses that have recurred over the past 7 years. We have obtained an MRI due to his complex history and a fistula is noted arising in the right posterior anorectal space. We discussed that most likely we will need to do an exam under anesthesia and place a seton as he has had a fistulotomy before and probably will not tolerate a second fistulotomy. We will then discuss an operative plan once we have the information from the exam under anesthesia. We discussed possibly performing a fistulotomy if there is no overlying muscle involved in his fistula tract. He is aware that the chances of this are very slim.

## 2017-08-26 NOTE — H&P (Signed)
History of Present Illness Matthew Ruff MD; 5/97/4163 9:34 AM) The patient is a 62 year old male presenting for a post-operative visit. 62 year old male status post I&D of right perirectal abscess by Dr. Rosendo Gros on 02/24/17. His Penrose drain was removed in the office on 03/05/17. At that time he was advised to continue doing twice daily wet-to-dry dressing changes. Patient followed up with me once afterwards and the wound was healed. He is presenting today with concerns of bleeding from his incision which has occurred monthly for the last 3 months and lasts 2-3 days. He has not noticed any bleeding today. Rates pain 1/10. Denies any chronic diarrhea or loose stools  He states he has had several abscess in his perianal region over the past few years, some requiring I&D and some resolving spontaneously. He also underwent a fistulotomy in 2012   Problem List/Past Medical Matthew Ruff, MD; 8/45/3646 9:34 AM) Kathi Der ABSCESS (K61.1) PERIRECTAL INFLAMMATION (K62.89) ANAL FISTULA (K60.3)  Past Surgical History Matthew Ruff, MD; 08/09/2120 9:34 AM) Orpah Cobb Fissure Repair Colon Polyp Removal - Colonoscopy Colon Removal - Partial Spinal Surgery - Lower Back Spinal Surgery - Neck Spinal Surgery Midback  Diagnostic Studies History Matthew Ruff, MD; 4/82/5003 9:34 AM) Colonoscopy 1-5 years ago  Allergies (Tanisha A. Owens Shark, [place]; 08/26/2017 9:07 AM) No Known Drug Allergies [03/05/2017]: Allergies Reconciled  Medication History (Tanisha A. Brown, [place]; 08/26/2017 9:07 AM) AmLODIPine Besylate (10MG Tablet, Oral) Active. Atorvastatin Calcium (40MG Tablet, Oral) Active. DULoxetine HCl (30MG Capsule DR Part, Oral) Active. GlipiZIDE (5MG Tablet, Oral) Active. Lisinopril (40MG Tablet, Oral) Active. PredniSONE (10MG Tablet, Oral) Active. Medications Reconciled  Social History Matthew Ruff, MD; 07/11/8887 9:34 AM) Alcohol use Remotely quit alcohol use. Caffeine use  Carbonated beverages, Coffee, Tea. Illicit drug use Remotely quit drug use. Tobacco use Former smoker.  Family History Matthew Ruff, MD; 1/69/4503 9:34 AM) Alcohol Abuse Brother, Father. Arthritis Mother. Breast Cancer Sister. Depression Mother. Heart Disease Mother.  Other Problems Matthew Ruff, MD; 8/88/2800 9:34 AM) Alcohol Abuse Arthritis Back Pain Cancer Cirrhosis Of Liver Diabetes Mellitus High blood pressure Hypercholesterolemia     Review of Systems Matthew Ruff MD; 3/49/1791 9:34 AM) General Present- Fatigue. Not Present- Appetite Loss, Chills, Fever, Night Sweats, Weight Gain and Weight Loss. Skin Not Present- Change in Wart/Mole, Dryness, Hives, Jaundice, New Lesions, Non-Healing Wounds, Rash and Ulcer. HEENT Present- Ringing in the Ears and Wears glasses/contact lenses. Not Present- Earache, Hearing Loss, Hoarseness, Nose Bleed, Oral Ulcers, Seasonal Allergies, Sinus Pain, Sore Throat, Visual Disturbances and Yellow Eyes. Respiratory Not Present- Bloody sputum, Chronic Cough, Difficulty Breathing, Snoring and Wheezing. Breast Not Present- Breast Mass, Breast Pain, Nipple Discharge and Skin Changes. Cardiovascular Not Present- Chest Pain, Difficulty Breathing Lying Down, Leg Cramps, Palpitations, Rapid Heart Rate, Shortness of Breath and Swelling of Extremities. Gastrointestinal Present- Rectal Pain. Not Present- Abdominal Pain, Bloating, Bloody Stool, Change in Bowel Habits, Chronic diarrhea, Constipation, Difficulty Swallowing, Excessive gas, Gets full quickly at meals, Hemorrhoids, Indigestion, Nausea and Vomiting.  Vitals (Tanisha A. Brown RMA; 08/26/2017 9:06 AM) 08/26/2017 9:06 AM Weight: 241.8 lb Height: 70in Body Surface Area: 2.26 m Body Mass Index: 34.69 kg/m  Temp.: 97.15F  Pulse: 109 (Regular)  BP: 134/86 (Sitting, Left Arm, Standard)      Physical Exam Matthew Ruff MD; 05/11/6977 9:35 AM)  The physical exam  findings are as follows: Note:GENERAL: Well-developed, well nourished male in no acute distress  EYES: No scleral icterus Pupils equal, lids normal  EXTERNAL EARS: Intact, no masses or lesions EXTERNAL  NOSE: Intact, no masses or lesions MOUTH: Lips - no lesions Dentition - normal for age  RESPIRATORY: Normal effort, no use of accessory muscles  MUSCULOSKELETAL: Normal gait Grossly normal ROM upper extremities Grossly normal ROM lower extremities  SKIN: Warm and dry Not diaphoretic  PSYCHIATRIC: Normal judgement and insight Normal mood and affect Alert, oriented x 3  Male Genitourinary Note: Right perirectal abscess incision site with no surrounding erythema, induration, or fluctuance There is an erythematous lesion next to the scar which the patient confirms that the spot of drainage. It is not open or draining at the moment. There is no fluctuation underneath. I cannot palpate a palpable cord.    Assessment & Plan Matthew Ruff MD; 04/09/4740 9:33 AM)  ANAL FISTULA (K60.3) Impression: 62yo M with recurrent anal abscesses with a h/o fistulotomy in 2012. He has had multiple abscesses that have recurred over the past 7 years. We have obtained an MRI due to his complex history and a fistula is noted arising in the right posterior anorectal space. We discussed that most likely we will need to do an exam under anesthesia and place a seton as he has had a fistulotomy before and probably will not tolerate a second fistulotomy. We will then discuss an operative plan once we have the information from the exam under anesthesia. We discussed possibly performing a fistulotomy if there is no overlying muscle involved in his fistula tract. He is aware that the chances of this are very slim.

## 2017-09-01 ENCOUNTER — Encounter (HOSPITAL_BASED_OUTPATIENT_CLINIC_OR_DEPARTMENT_OTHER): Payer: Self-pay | Admitting: *Deleted

## 2017-09-01 ENCOUNTER — Other Ambulatory Visit: Payer: Self-pay

## 2017-09-01 NOTE — Progress Notes (Signed)
SPOKE WITH Jermain NPO AFTER MIDNIGHT ARRIVE 730 AM 09-04-17 [place] [facility] TO TAKE SIP OF WATER: GABAPENTIN, DULOXETINE, 1/2 DOSE PM LEVEMIR INSULIN ON 09-04-17 WIFE REGINA DRIVER WILL STAY FOR SURGERY HAS SURGERY ORDERS IN Epic NEEDS I STAT 4 , EKG AND CBG

## 2017-09-05 ENCOUNTER — Ambulatory Visit (HOSPITAL_BASED_OUTPATIENT_CLINIC_OR_DEPARTMENT_OTHER): Payer: Medicare Other | Admitting: Anesthesiology

## 2017-09-05 ENCOUNTER — Encounter (HOSPITAL_BASED_OUTPATIENT_CLINIC_OR_DEPARTMENT_OTHER): Payer: Self-pay | Admitting: Anesthesiology

## 2017-09-05 ENCOUNTER — Encounter (HOSPITAL_BASED_OUTPATIENT_CLINIC_OR_DEPARTMENT_OTHER)
Admission: RE | Disposition: A | Discharge status: Home / Self Care | Payer: Self-pay | Source: Ambulatory Visit | Attending: General Surgery

## 2017-09-05 ENCOUNTER — Other Ambulatory Visit: Payer: Self-pay

## 2017-09-05 ENCOUNTER — Ambulatory Visit (HOSPITAL_BASED_OUTPATIENT_CLINIC_OR_DEPARTMENT_OTHER)
Admission: RE | Admit: 2017-09-05 | Discharge: 2017-09-05 | Disposition: A | Discharge status: Home / Self Care | Payer: Medicare Other | Source: Ambulatory Visit | Attending: General Surgery | Admitting: General Surgery

## 2017-09-05 DIAGNOSIS — E119 Type 2 diabetes mellitus without complications: Secondary | ICD-10-CM | POA: Insufficient documentation

## 2017-09-05 DIAGNOSIS — M1990 Unspecified osteoarthritis, unspecified site: Secondary | ICD-10-CM | POA: Insufficient documentation

## 2017-09-05 DIAGNOSIS — K7460 Unspecified cirrhosis of liver: Secondary | ICD-10-CM | POA: Diagnosis not present

## 2017-09-05 DIAGNOSIS — Z79899 Other long term (current) drug therapy: Secondary | ICD-10-CM | POA: Insufficient documentation

## 2017-09-05 DIAGNOSIS — Z87891 Personal history of nicotine dependence: Secondary | ICD-10-CM | POA: Insufficient documentation

## 2017-09-05 DIAGNOSIS — I2510 Atherosclerotic heart disease of native coronary artery without angina pectoris: Secondary | ICD-10-CM | POA: Diagnosis not present

## 2017-09-05 DIAGNOSIS — Z7952 Long term (current) use of systemic steroids: Secondary | ICD-10-CM | POA: Insufficient documentation

## 2017-09-05 DIAGNOSIS — G709 Myoneural disorder, unspecified: Secondary | ICD-10-CM | POA: Diagnosis not present

## 2017-09-05 DIAGNOSIS — Z8249 Family history of ischemic heart disease and other diseases of the circulatory system: Secondary | ICD-10-CM | POA: Diagnosis not present

## 2017-09-05 DIAGNOSIS — Z7984 Long term (current) use of oral hypoglycemic drugs: Secondary | ICD-10-CM | POA: Diagnosis not present

## 2017-09-05 DIAGNOSIS — I10 Essential (primary) hypertension: Secondary | ICD-10-CM | POA: Diagnosis not present

## 2017-09-05 DIAGNOSIS — Z86010 Personal history of colonic polyps: Secondary | ICD-10-CM | POA: Insufficient documentation

## 2017-09-05 DIAGNOSIS — K603 Anal fistula: Secondary | ICD-10-CM | POA: Insufficient documentation

## 2017-09-05 DIAGNOSIS — E7800 Pure hypercholesterolemia, unspecified: Secondary | ICD-10-CM | POA: Diagnosis not present

## 2017-09-05 HISTORY — DX: Essential (primary) hypertension: I10

## 2017-09-05 HISTORY — DX: Cough: R05

## 2017-09-05 HISTORY — PX: PLACEMENT OF SETON: SHX6029

## 2017-09-05 LAB — POCT I-STAT 4, (NA,K, GLUC, HGB,HCT)
GLUCOSE: 199 mg/dL — AB (ref 70–99)
HCT: 45 % (ref 39.0–52.0)
Hemoglobin: 15.3 g/dL (ref 13.0–17.0)
Potassium: 4.5 mmol/L (ref 3.5–5.1)
Sodium: 134 mmol/L — ABNORMAL LOW (ref 135–145)

## 2017-09-05 LAB — GLUCOSE, CAPILLARY: Glucose-Capillary: 163 mg/dL — ABNORMAL HIGH (ref 70–99)

## 2017-09-05 SURGERY — EXAM UNDER ANESTHESIA
Anesthesia: Monitor Anesthesia Care | Site: Rectum

## 2017-09-05 MED ORDER — SODIUM CHLORIDE 0.9 % IV SOLN
INTRAVENOUS | Status: DC | PRN
  Administered 2017-09-05: 20 ug/kg/min via INTRAVENOUS

## 2017-09-05 MED ORDER — LIDOCAINE 2% (20 MG/ML) 5 ML SYRINGE
INTRAMUSCULAR | Status: AC
  Filled 2017-09-05: qty 5

## 2017-09-05 MED ORDER — LIDOCAINE HCL (CARDIAC) PF 100 MG/5ML IV SOSY
PREFILLED_SYRINGE | INTRAVENOUS | Status: DC | PRN
  Administered 2017-09-05: 25 mg via INTRAVENOUS

## 2017-09-05 MED ORDER — SODIUM CHLORIDE 0.9% FLUSH
3.0000 mL | INTRAVENOUS | Status: DC | PRN
  Filled 2017-09-05: qty 3

## 2017-09-05 MED ORDER — OXYCODONE HCL 5 MG PO TABS
5.0000 mg | ORAL_TABLET | Freq: Once | ORAL | Status: DC | PRN
  Filled 2017-09-05: qty 1

## 2017-09-05 MED ORDER — ONDANSETRON HCL 4 MG/2ML IJ SOLN
INTRAMUSCULAR | Status: AC
  Filled 2017-09-05: qty 2

## 2017-09-05 MED ORDER — MEPERIDINE HCL 25 MG/ML IJ SOLN
6.2500 mg | INTRAMUSCULAR | Status: DC | PRN
  Filled 2017-09-05: qty 1

## 2017-09-05 MED ORDER — LACTATED RINGERS IV SOLN
INTRAVENOUS | Status: DC
  Filled 2017-09-05: qty 1000

## 2017-09-05 MED ORDER — HYDROCODONE-ACETAMINOPHEN 5-325 MG PO TABS: 1.0000 | ORAL_TABLET | ORAL | 0 refills | Status: DC | PRN

## 2017-09-05 MED ORDER — ACETAMINOPHEN 325 MG PO TABS
650.0000 mg | ORAL_TABLET | ORAL | Status: DC | PRN
  Filled 2017-09-05: qty 2

## 2017-09-05 MED ORDER — MIDAZOLAM HCL 5 MG/5ML IJ SOLN
INTRAMUSCULAR | Status: DC | PRN
  Administered 2017-09-05: 2 mg via INTRAVENOUS

## 2017-09-05 MED ORDER — PROPOFOL 500 MG/50ML IV EMUL
INTRAVENOUS | Status: DC | PRN
  Administered 2017-09-05: 200 ug/kg/min via INTRAVENOUS

## 2017-09-05 MED ORDER — LACTATED RINGERS IV SOLN
INTRAVENOUS | Status: DC
  Administered 2017-09-05: 1000 mL via INTRAVENOUS
  Filled 2017-09-05: qty 1000

## 2017-09-05 MED ORDER — BUPIVACAINE-EPINEPHRINE 0.5% -1:200000 IJ SOLN
INTRAMUSCULAR | Status: DC | PRN
  Administered 2017-09-05: 30 mL

## 2017-09-05 MED ORDER — FENTANYL CITRATE (PF) 100 MCG/2ML IJ SOLN
25.0000 ug | INTRAMUSCULAR | Status: DC | PRN
  Filled 2017-09-05: qty 1

## 2017-09-05 MED ORDER — PROMETHAZINE HCL 25 MG/ML IJ SOLN
6.2500 mg | INTRAMUSCULAR | Status: DC | PRN
  Filled 2017-09-05: qty 1

## 2017-09-05 MED ORDER — ACETAMINOPHEN 650 MG RE SUPP
650.0000 mg | RECTAL | Status: DC | PRN
  Filled 2017-09-05: qty 1

## 2017-09-05 MED ORDER — HYDROMORPHONE HCL 1 MG/ML IJ SOLN
0.2500 mg | INTRAMUSCULAR | Status: DC | PRN
  Filled 2017-09-05: qty 0.5

## 2017-09-05 MED ORDER — LIDOCAINE 5 % EX OINT
TOPICAL_OINTMENT | CUTANEOUS | Status: DC | PRN
  Administered 2017-09-05: 1

## 2017-09-05 MED ORDER — SODIUM CHLORIDE 0.9% FLUSH
3.0000 mL | Freq: Two times a day (BID) | INTRAVENOUS | Status: DC
  Filled 2017-09-05: qty 3

## 2017-09-05 MED ORDER — KETAMINE HCL 10 MG/ML IJ SOLN
INTRAMUSCULAR | Status: DC | PRN
  Administered 2017-09-05: 10 mg via INTRAVENOUS

## 2017-09-05 MED ORDER — HYDROGEN PEROXIDE 3 % EX SOLN
CUTANEOUS | Status: DC | PRN
  Administered 2017-09-05: 1

## 2017-09-05 MED ORDER — OXYCODONE HCL 5 MG/5ML PO SOLN
5.0000 mg | Freq: Once | ORAL | Status: DC | PRN
  Filled 2017-09-05: qty 5

## 2017-09-05 MED ORDER — OXYCODONE HCL 5 MG PO TABS
5.0000 mg | ORAL_TABLET | ORAL | Status: DC | PRN
  Filled 2017-09-05: qty 2

## 2017-09-05 MED ORDER — PROPOFOL 500 MG/50ML IV EMUL
INTRAVENOUS | Status: AC
  Filled 2017-09-05: qty 50

## 2017-09-05 MED ORDER — ACETAMINOPHEN 500 MG PO TABS
1000.0000 mg | ORAL_TABLET | ORAL | Status: DC
  Filled 2017-09-05: qty 2

## 2017-09-05 MED ORDER — FENTANYL CITRATE (PF) 100 MCG/2ML IJ SOLN
INTRAMUSCULAR | Status: AC
  Filled 2017-09-05: qty 2

## 2017-09-05 MED ORDER — SODIUM CHLORIDE 0.9 % IV SOLN
250.0000 mL | INTRAVENOUS | Status: DC | PRN
  Filled 2017-09-05: qty 250

## 2017-09-05 MED ORDER — MIDAZOLAM HCL 2 MG/2ML IJ SOLN
INTRAMUSCULAR | Status: AC
  Filled 2017-09-05: qty 2

## 2017-09-05 MED ORDER — KETAMINE HCL 10 MG/ML IJ SOLN
INTRAMUSCULAR | Status: AC
  Filled 2017-09-05: qty 1

## 2017-09-05 SURGICAL SUPPLY — 58 items
APL SKNCLS STERI-STRIP NONHPOA (GAUZE/BANDAGES/DRESSINGS) ×2
BENZOIN TINCTURE PRP APPL 2/3 (GAUZE/BANDAGES/DRESSINGS) ×4 IMPLANT
BLADE EXTENDED COATED 6.5IN (ELECTRODE) IMPLANT
BLADE HEX COATED 2.75 (ELECTRODE) ×4 IMPLANT
BLADE SURG 10 STRL SS (BLADE) IMPLANT
BLADE SURG 15 STRL LF DISP TIS (BLADE) IMPLANT
BLADE SURG 15 STRL SS (BLADE)
BRIEF STRETCH FOR OB PAD LRG (UNDERPADS AND DIAPERS) ×8 IMPLANT
CANISTER SUCT 3000ML PPV (MISCELLANEOUS) ×4 IMPLANT
COVER BACK TABLE 60X90IN (DRAPES) ×4 IMPLANT
COVER MAYO STAND STRL (DRAPES) ×4 IMPLANT
DECANTER SPIKE VIAL GLASS SM (MISCELLANEOUS) ×4 IMPLANT
DRAPE LAPAROTOMY 100X72 PEDS (DRAPES) ×4 IMPLANT
DRAPE SHEET LG 3/4 BI-LAMINATE (DRAPES) IMPLANT
DRAPE UNDERBUTTOCKS STRL (DRAPE) IMPLANT
DRAPE UTILITY XL STRL (DRAPES) ×4 IMPLANT
ELECT REM PT RETURN 9FT ADLT (ELECTROSURGICAL) ×4
ELECTRODE REM PT RTRN 9FT ADLT (ELECTROSURGICAL) ×2 IMPLANT
GAUZE 4X4 16PLY RFD (DISPOSABLE) IMPLANT
GAUZE SPONGE 4X4 12PLY STRL (GAUZE/BANDAGES/DRESSINGS) ×4 IMPLANT
GLOVE BIO SURGEON STRL SZ 6.5 (GLOVE) ×3 IMPLANT
GLOVE BIO SURGEONS STRL SZ 6.5 (GLOVE) ×1
GLOVE BIOGEL PI IND STRL 7.0 (GLOVE) ×2 IMPLANT
GLOVE BIOGEL PI INDICATOR 7.0 (GLOVE) ×2
GOWN SPEC L3 XXLG W/TWL (GOWN DISPOSABLE) ×4 IMPLANT
GOWN STRL REUS W/TWL 2XL LVL3 (GOWN DISPOSABLE) ×4 IMPLANT
HYDROGEN PEROXIDE 16OZ (MISCELLANEOUS) ×4 IMPLANT
IV CATH 14GX2 1/4 (CATHETERS) ×4 IMPLANT
IV CATH 18G SAFETY (IV SOLUTION) IMPLANT
KIT TURNOVER CYSTO (KITS) ×4 IMPLANT
LEGGING LITHOTOMY PAIR STRL (DRAPES) IMPLANT
LOOP VESSEL MAXI BLUE (MISCELLANEOUS) ×4 IMPLANT
NEEDLE HYPO 22GX1.5 SAFETY (NEEDLE) ×4 IMPLANT
NS IRRIG 500ML POUR BTL (IV SOLUTION) ×4 IMPLANT
PACK BASIN DAY SURGERY FS (CUSTOM PROCEDURE TRAY) ×4 IMPLANT
PAD ABD 8X10 STRL (GAUZE/BANDAGES/DRESSINGS) ×4 IMPLANT
PAD ARMBOARD 7.5X6 YLW CONV (MISCELLANEOUS) ×4 IMPLANT
PENCIL BUTTON HOLSTER BLD 10FT (ELECTRODE) ×4 IMPLANT
SPONGE HEMORRHOID 8X3CM (HEMOSTASIS) IMPLANT
SPONGE SURGIFOAM ABS GEL 100 (HEMOSTASIS) IMPLANT
SPONGE SURGIFOAM ABS GEL 12-7 (HEMOSTASIS) IMPLANT
SUCTION FRAZIER HANDLE 10FR (MISCELLANEOUS)
SUCTION TUBE FRAZIER 10FR DISP (MISCELLANEOUS) IMPLANT
SUT CHROMIC 2 0 SH (SUTURE) IMPLANT
SUT CHROMIC 3 0 SH 27 (SUTURE) IMPLANT
SUT ETHIBOND 0 (SUTURE) ×4 IMPLANT
SUT VIC AB 2-0 SH 27 (SUTURE)
SUT VIC AB 2-0 SH 27XBRD (SUTURE) IMPLANT
SUT VIC AB 3-0 SH 18 (SUTURE) IMPLANT
SUT VIC AB 4-0 P-3 18XBRD (SUTURE) IMPLANT
SUT VIC AB 4-0 P3 18 (SUTURE)
SYR 10ML LL (SYRINGE) ×4 IMPLANT
SYR CONTROL 10ML LL (SYRINGE) ×4 IMPLANT
TOWEL OR 17X24 6PK STRL BLUE (TOWEL DISPOSABLE) ×8 IMPLANT
TRAY DSU PREP LF (CUSTOM PROCEDURE TRAY) ×4 IMPLANT
TUBE CONNECTING 12'X1/4 (SUCTIONS) ×1
TUBE CONNECTING 12X1/4 (SUCTIONS) ×3 IMPLANT
YANKAUER SUCT BULB TIP NO VENT (SUCTIONS) ×4 IMPLANT

## 2017-09-05 NOTE — Anesthesia Postprocedure Evaluation (Signed)
Anesthesia Post Note  Patient: Matthew Lewis  Procedure(s) Performed: ANAL EXAM UNDER ANESTHESIA (N/A Rectum) PLACEMENT OF SETON (N/A Rectum)     Patient location during evaluation: PACU Anesthesia Type: MAC Level of consciousness: awake and alert Pain management: pain level controlled Vital Signs Assessment: post-procedure vital signs reviewed and stable Respiratory status: spontaneous breathing, nonlabored ventilation, respiratory function stable and patient connected to nasal cannula oxygen Cardiovascular status: stable and blood pressure returned to baseline Postop Assessment: no apparent nausea or vomiting Anesthetic complications: no    Last Vitals:  Vitals:   09/05/17 1015 09/05/17 1030  BP: 114/81 116/73  Pulse: (!) 101 (!) 101  Resp: 20 17  Temp:    SpO2: 91% 92%    Last Pain:  Vitals:   09/05/17 1030  TempSrc:   PainSc: 0-No pain                 Effie Berkshire

## 2017-09-05 NOTE — Op Note (Signed)
09/05/2017  9:55 AM  PATIENT:  Matthew Lewis  62 y.o. male  Patient Care Team: [name], MD as PCP - General (Internal Medicine)  PRE-OPERATIVE DIAGNOSIS:  ANAL FISTULA  POST-OPERATIVE DIAGNOSIS:  ANAL FISTULA  PROCEDURE:  ANAL EXAM UNDER ANESTHESIA PLACEMENT OF SETON   Surgeon(s): Leighton Ruff, MD  ASSISTANT: none   ANESTHESIA:   local and MAC  SPECIMEN:  No Specimen  DISPOSITION OF SPECIMEN:  PATHOLOGY  COUNTS:  YES  PLAN OF CARE: Discharge to home after PACU  PATIENT DISPOSITION:  PACU - hemodynamically stable.  INDICATION: 62 y.o. M with recurrent perirectal abscesses and active R posterio-lateral fistula seen on MRI   OR FINDINGS: R posterior lateral fistula, trans-sphincteric  DESCRIPTION: the patient was identified in the preoperative holding area and taken to the OR where they were laid on the operating room table.  MAC anesthesia was induced without difficulty. The patient was then positioned in prone jackknife position with buttocks gently taped apart.  The patient was then prepped and draped in usual sterile fashion.  SCDs were noted to be in place prior to the initiation of anesthesia. A surgical timeout was performed indicating the correct patient, procedure, positioning and need for preoperative antibiotics.  A rectal block was performed using Marcaine with epinephrine.    I began with a digital rectal exam.  There were no masses noted.  I then placed a Hill-Ferguson anoscope into the anal canal and evaluated this completely.  No abnormal lesions could be identified.  I then used an S-shaped fistula probe to probe what appeared to be the external opening that has since healed over.  I was able to get a fistula probe into this area but was unable to find a fistula tract.  I used a 15 blade scalpel to cut down over the scar and identified a fistula tract deeper underneath the subcutaneous tissue.  This fistula tract laid in a radial pattern towards  the dentate line.  I injected hydrogen peroxide into the track and did confirm that this was patent to the level of the mucosa.  I then placed the fistula probe through this tract and pulled an Ethibond suture back through.  The vessel loop was then pulled through the fistula tract and tied in a loop fashion using the Ethibond suture.  This created a seton which was loose and noncutting.  Once this was completed, the patient was awakened from anesthesia and sent to the postanesthesia care unit in stable condition.  All counts were correct per operating room staff.  I have reviewed the [place] for this patient.

## 2017-09-05 NOTE — Anesthesia Preprocedure Evaluation (Addendum)
Anesthesia Evaluation  Patient identified by MRN, date of birth, ID band Patient awake    Reviewed: Allergy & Precautions, NPO status , Patient's Chart, lab work & pertinent test results  Airway Mallampati: I  TM Distance: >3 FB Neck ROM: Full    Dental  (+) Teeth Intact, Dental Advisory Given   Pulmonary former smoker,    breath sounds clear to auscultation       Cardiovascular hypertension, Pt. on medications + CAD   Rhythm:Regular Rate:Normal     Neuro/Psych Anxiety  Neuromuscular disease    GI/Hepatic negative GI ROS, Neg liver ROS,   Endo/Other  diabetes, Type 2, Oral Hypoglycemic Agents  Renal/GU      Musculoskeletal  (+) Arthritis ,   Abdominal (+) + obese,   Peds  Hematology   Anesthesia Other Findings Pt in recovery for ETOH and drug abuse, requests no narcotic if possible.  Reproductive/Obstetrics                           Anesthesia Physical Anesthesia Plan  ASA: II  Anesthesia Plan: MAC   Post-op Pain Management:    Induction: Intravenous  PONV Risk Score and Plan: Propofol infusion, Ondansetron and Midazolam  Airway Management Planned: Nasal Cannula  Additional Equipment: None  Intra-op Plan:   Post-operative Plan:   Informed Consent: I have reviewed the patients History and Physical, chart, labs and discussed the procedure including the risks, benefits and alternatives for the proposed anesthesia with the patient or authorized representative who has indicated his/her understanding and acceptance.     Plan Discussed with: CRNA  Anesthesia Plan Comments:        Anesthesia Quick Evaluation

## 2017-09-05 NOTE — Transfer of Care (Signed)
Immediate Anesthesia Transfer of Care Note  Patient: Matthew Lewis  Procedure(s) Performed: ANAL EXAM UNDER ANESTHESIA (N/A Rectum) PLACEMENT OF SETON (N/A Rectum)  Patient Location: PACU  Anesthesia Type:MAC  Level of Consciousness: awake, alert , oriented and patient cooperative  Airway & Oxygen Therapy: Patient Spontanous Breathing and Patient connected to nasal cannula oxygen  Post-op Assessment: Report given to RN and Post -op Vital signs reviewed and stable  Post vital signs: Reviewed and stable  Last Vitals:  Vitals Value Taken Time  BP    Temp    Pulse 101 09/05/2017 10:03 AM  Resp    SpO2 98 % 09/05/2017 10:03 AM  Vitals shown include unvalidated device data.  Last Pain:  Vitals:   09/05/17 0830  TempSrc:   PainSc: 5       Patients Stated Pain Goal: 9(history of drug addiction) (35/36/14 4315)  Complications: No apparent anesthesia complications

## 2017-09-05 NOTE — Discharge Instructions (Addendum)
ANORECTAL SURGERY: POST OP INSTRUCTIONS °1. Take your usually prescribed home medications unless otherwise directed. °2. DIET: During the first few hours after surgery sip on some liquids until you are able to urinate.  It is normal to not urinate for several hours after this surgery.  If you feel uncomfortable, please contact the office for instructions.  After you are able to urinate,you may eat, if you feel like it.  Follow a light bland diet the first 24 hours after arrival home, such as soup, liquids, crackers, etc.  Be sure to include lots of fluids daily (6-8 glasses).  Avoid fast food or heavy meals, as your are more likely to get nauseated.  Eat a low fat diet the next few days after surgery.  Limit caffeine intake to 1-2 servings a day. °3. PAIN CONTROL: °a. Pain is best controlled by a usual combination of several different methods TOGETHER: °i. Muscle relaxation: Soak in a warm bath (or Sitz bath) three times a day and after bowel movements.  Continue to do this until all pain is resolved. °ii. Over the counter pain medication °iii. Prescription pain medication °b. Most patients will experience some swelling and discomfort in the anus/rectal area and incisions.  Heat such as warm towels, sitz baths, warm baths, etc to help relax tight/sore spots and speed recovery.  Some people prefer to use ice, especially in the first couple days after surgery, as it may decrease the pain and swelling, or alternate between ice & heat.  Experiment to what works for you.  Swelling and bruising can take several weeks to resolve.  Pain can take even longer to completely resolve. °c. It is helpful to take an over-the-counter pain medication regularly for the first few weeks.  Choose one of the following that works best for you: °i. Naproxen (Aleve, etc)  Two 220mg tabs twice a day °ii. Ibuprofen (Advil, etc) Three 200mg tabs four times a day (every meal & bedtime) °d. A  prescription for pain medication (such as percocet,  oxycodone, hydrocodone, etc) should be given to you upon discharge.  Take your pain medication as prescribed.  °i. If you are having problems/concerns with the prescription medicine (does not control pain, nausea, vomiting, rash, itching, etc), please call us [phone] to see if we need to switch you to a different pain medicine that will work better for you and/or control your side effect better. °ii. If you need a refill on your pain medication, please contact your pharmacy.  They will contact our office to request authorization. Prescriptions will not be filled after 5 pm or on week-ends. °4. KEEP YOUR BOWELS REGULAR and AVOID CONSTIPATION °a. The goal is one to two soft bowel movements a day.  You should at least have a bowel movement every other day. °b. Avoid getting constipated.  Between the surgery and the pain medications, it is common to experience some constipation. This can be very painful after rectal surgery.  Increasing fluid intake and taking a fiber supplement (such as Metamucil, Citrucel, FiberCon, etc) 1-2 times a day regularly will usually help prevent this problem from occurring.  A stool softener like colace is also recommended.  This can be purchased over the counter at your pharmacy.  You can take it up to 3 times a day.  If you do not have a bowel movement after 24 hrs since your surgery, take one does of milk of magnesia.  If you still haven't had a bowel movement 8-12 hours after   that dose, take another dose.  If you don't have a bowel movement 48 hrs after surgery, purchase a Fleets enema from the drug store and administer gently per package instructions.  If you still are having trouble with your bowel movements after that, please call the office for further instructions. °c. If you develop diarrhea or have many loose bowel movements, simplify your diet to bland foods & liquids for a few days.  Stop any stool softeners and decrease your fiber supplement.  Switching to mild  anti-diarrheal medications (Kayopectate, Pepto Bismol) can help.  If this worsens or does not improve, please call us. ° °5. Wound Care °a. Remove your bandages before your first bowel movement or 8 hours after surgery.     °b. Remove any wound packing material at this tim,e as well.  You do not need to repack the wound unless instructed otherwise.  Wear an absorbent pad or soft cotton gauze in your underwear to catch any drainage and help keep the area clean. You should change this every 2-3 hours while awake. °c. Keep the area clean and dry.  Bathe / shower every day, especially after bowel movements.  Keep the area clean by showering / bathing over the incision / wound.   It is okay to soak an open wound to help wash it.  Wet wipes or showers / gentle washing after bowel movements is often less traumatic than regular toilet paper. °d. You may have some styrofoam-like soft packing in the rectum which will come out with the first bowel movement.  °e. You will often notice bleeding with bowel movements.  This should slow down by the end of the first week of surgery °f. Expect some drainage.  This should slow down, too, by the end of the first week of surgery.  Wear an absorbent pad or soft cotton gauze in your underwear until the drainage stops. °g. Do Not sit on a rubber or pillow ring.  This can make you symptoms worse.  You may sit on a soft pillow if needed.  °6. ACTIVITIES as tolerated:   °a. You may resume regular (light) daily activities beginning the next day--such as daily self-care, walking, climbing stairs--gradually increasing activities as tolerated.  If you can walk 30 minutes without difficulty, it is safe to try more intense activity such as jogging, treadmill, bicycling, low-impact aerobics, swimming, etc. °b. Save the most intensive and strenuous activity for last such as sit-ups, heavy lifting, contact sports, etc  Refrain from any heavy lifting or straining until you are off narcotics for pain  control.   °c. You may drive when you are no longer taking prescription pain medication, you can comfortably sit for long periods of time, and you can safely maneuver your car and apply brakes. °d. You may have sexual intercourse when it is comfortable.  °7. FOLLOW UP in our office °a. Please call [facility] at [phone] to set up an appointment to see your surgeon in the office for a follow-up appointment approximately 3-4 weeks after your surgery. °b. Make sure that you call for this appointment the day you arrive home to insure a convenient appointment time. °10. IF YOU HAVE DISABILITY OR FAMILY LEAVE FORMS, BRING THEM TO THE OFFICE FOR PROCESSING.  DO NOT GIVE THEM TO YOUR DOCTOR. ° ° ° ° °WHEN TO CALL US [phone]: °1. Poor pain control °2. Reactions / problems with new medications (rash/itching, nausea, etc)  °3. Fever over 101.5 F (38.5 C) °4.   Inability to urinate 5. Nausea and/or vomiting 6. Worsening swelling or bruising 7. Continued bleeding from incision. 8. Increased pain, redness, or drainage from the incision  The clinic staff is available to answer your questions during regular business hours (8:30am-5pm).  Please dont hesitate to call and ask to speak to one of our nurses for clinical concerns.   A surgeon from [facility] is always on call at the hospitals   If you have a medical emergency, go to the nearest emergency room or call 911.    [facility], [place], [place], [place], [place]  [address] ? MAIN: [phone]) [phone] ? TOLL FREE: [phone] ? FAX [phone] [email or web address]   Post Anesthesia Home Care Instructions  Activity: Get plenty of rest for the remainder of the day. A responsible adult should stay with you for 24 hours following the procedure.  For the next 24 hours, DO NOT: -Drive a car -Paediatric nurse -Drink alcoholic beverages -Take any medication unless instructed by your physician -Make  any legal decisions or sign important papers.  Meals: Start with liquid foods such as gelatin or soup. Progress to regular foods as tolerated. Avoid greasy, spicy, heavy foods. If nausea and/or vomiting occur, drink only clear liquids until the nausea and/or vomiting subsides. Call your physician if vomiting continues.  Special Instructions/Symptoms: Your throat may feel dry or sore from the anesthesia or the breathing tube placed in your throat during surgery. If this causes discomfort, gargle with warm salt water. The discomfort should disappear within 24 hours.  If you had a scopolamine patch placed behind your ear for the management of post- operative nausea and/or vomiting:  1. The medication in the patch is effective for 72 hours, after which it should be removed.  Wrap patch in a tissue and discard in the trash. Wash hands thoroughly with soap and water. 2. You may remove the patch earlier than 72 hours if you experience unpleasant side effects which may include dry mouth, dizziness or visual disturbances. 3. Avoid touching the patch. Wash your hands with soap and water after contact with the patch.

## 2017-09-05 NOTE — Interval H&P Note (Signed)
History and Physical Interval Note:  09/05/2017 7:35 AM  Matthew Lewis  has presented today for surgery, with the diagnosis of ANAL FISTULA  The various methods of treatment have been discussed with the patient and family. After consideration of risks, benefits and other options for treatment, the patient has consented to  Procedure(s): ANAL EXAM UNDER ANESTHESIA (N/A) PLACEMENT OF SETON (N/A) POSSIBLE FISTULOTOMY (N/A) as a surgical intervention .  The patient's history has been reviewed, patient examined, no change in status, stable for surgery.  I have reviewed the patient's chart and labs.  Questions were answered to the patient's satisfaction.     Rosario Adie, MD  [facility]

## 2017-09-09 ENCOUNTER — Encounter (HOSPITAL_BASED_OUTPATIENT_CLINIC_OR_DEPARTMENT_OTHER): Payer: Self-pay | Admitting: General Surgery

## 2017-09-15 DIAGNOSIS — E1165 Type 2 diabetes mellitus with hyperglycemia: Secondary | ICD-10-CM | POA: Diagnosis not present

## 2017-09-15 DIAGNOSIS — E1140 Type 2 diabetes mellitus with diabetic neuropathy, unspecified: Secondary | ICD-10-CM | POA: Diagnosis not present

## 2017-09-15 DIAGNOSIS — I2510 Atherosclerotic heart disease of native coronary artery without angina pectoris: Secondary | ICD-10-CM | POA: Diagnosis not present

## 2017-09-17 DIAGNOSIS — Z23 Encounter for immunization: Secondary | ICD-10-CM | POA: Diagnosis not present

## 2017-09-17 DIAGNOSIS — I2510 Atherosclerotic heart disease of native coronary artery without angina pectoris: Secondary | ICD-10-CM | POA: Diagnosis not present

## 2017-09-17 DIAGNOSIS — E1165 Type 2 diabetes mellitus with hyperglycemia: Secondary | ICD-10-CM | POA: Diagnosis not present

## 2017-09-17 DIAGNOSIS — R21 Rash and other nonspecific skin eruption: Secondary | ICD-10-CM | POA: Diagnosis not present

## 2017-09-17 DIAGNOSIS — R748 Abnormal levels of other serum enzymes: Secondary | ICD-10-CM | POA: Diagnosis not present

## 2017-09-17 DIAGNOSIS — E1140 Type 2 diabetes mellitus with diabetic neuropathy, unspecified: Secondary | ICD-10-CM | POA: Diagnosis not present

## 2017-09-18 DIAGNOSIS — R748 Abnormal levels of other serum enzymes: Secondary | ICD-10-CM | POA: Diagnosis not present

## 2017-10-20 DIAGNOSIS — R197 Diarrhea, unspecified: Secondary | ICD-10-CM | POA: Diagnosis not present

## 2017-10-22 DIAGNOSIS — A0472 Enterocolitis due to Clostridium difficile, not specified as recurrent: Secondary | ICD-10-CM | POA: Diagnosis not present

## 2017-10-22 DIAGNOSIS — R17 Unspecified jaundice: Secondary | ICD-10-CM | POA: Diagnosis not present

## 2017-10-22 DIAGNOSIS — R5383 Other fatigue: Secondary | ICD-10-CM | POA: Diagnosis not present

## 2017-10-23 ENCOUNTER — Emergency Department (HOSPITAL_COMMUNITY): Payer: Medicare Other

## 2017-10-23 ENCOUNTER — Encounter (HOSPITAL_COMMUNITY): Payer: Self-pay

## 2017-10-23 ENCOUNTER — Other Ambulatory Visit: Payer: Self-pay

## 2017-10-23 ENCOUNTER — Inpatient Hospital Stay (HOSPITAL_COMMUNITY)
Admission: EM | Admit: 2017-10-23 | Discharge: 2017-10-29 | DRG: 444 | Disposition: A | Discharge status: Home / Self Care | Payer: Medicare Other | Attending: Family Medicine | Admitting: Family Medicine

## 2017-10-23 DIAGNOSIS — R160 Hepatomegaly, not elsewhere classified: Secondary | ICD-10-CM | POA: Diagnosis not present

## 2017-10-23 DIAGNOSIS — I81 Portal vein thrombosis: Secondary | ICD-10-CM | POA: Diagnosis present

## 2017-10-23 DIAGNOSIS — C801 Malignant (primary) neoplasm, unspecified: Secondary | ICD-10-CM | POA: Diagnosis not present

## 2017-10-23 DIAGNOSIS — K7460 Unspecified cirrhosis of liver: Secondary | ICD-10-CM | POA: Diagnosis not present

## 2017-10-23 DIAGNOSIS — E119 Type 2 diabetes mellitus without complications: Secondary | ICD-10-CM | POA: Diagnosis not present

## 2017-10-23 DIAGNOSIS — R918 Other nonspecific abnormal finding of lung field: Secondary | ICD-10-CM | POA: Diagnosis not present

## 2017-10-23 DIAGNOSIS — I2510 Atherosclerotic heart disease of native coronary artery without angina pectoris: Secondary | ICD-10-CM | POA: Diagnosis not present

## 2017-10-23 DIAGNOSIS — Z7982 Long term (current) use of aspirin: Secondary | ICD-10-CM

## 2017-10-23 DIAGNOSIS — K7031 Alcoholic cirrhosis of liver with ascites: Secondary | ICD-10-CM | POA: Diagnosis present

## 2017-10-23 DIAGNOSIS — Z86010 Personal history of colonic polyps: Secondary | ICD-10-CM | POA: Diagnosis not present

## 2017-10-23 DIAGNOSIS — Z794 Long term (current) use of insulin: Secondary | ICD-10-CM

## 2017-10-23 DIAGNOSIS — C772 Secondary and unspecified malignant neoplasm of intra-abdominal lymph nodes: Secondary | ICD-10-CM | POA: Diagnosis present

## 2017-10-23 DIAGNOSIS — Z85038 Personal history of other malignant neoplasm of large intestine: Secondary | ICD-10-CM | POA: Diagnosis not present

## 2017-10-23 DIAGNOSIS — Z981 Arthrodesis status: Secondary | ICD-10-CM

## 2017-10-23 DIAGNOSIS — E1142 Type 2 diabetes mellitus with diabetic polyneuropathy: Secondary | ICD-10-CM | POA: Diagnosis not present

## 2017-10-23 DIAGNOSIS — K831 Obstruction of bile duct: Principal | ICD-10-CM

## 2017-10-23 DIAGNOSIS — C7970 Secondary malignant neoplasm of unspecified adrenal gland: Secondary | ICD-10-CM

## 2017-10-23 DIAGNOSIS — Z87891 Personal history of nicotine dependence: Secondary | ICD-10-CM | POA: Diagnosis not present

## 2017-10-23 DIAGNOSIS — K7030 Alcoholic cirrhosis of liver without ascites: Secondary | ICD-10-CM | POA: Diagnosis present

## 2017-10-23 DIAGNOSIS — F4310 Post-traumatic stress disorder, unspecified: Secondary | ICD-10-CM | POA: Diagnosis present

## 2017-10-23 DIAGNOSIS — M1990 Unspecified osteoarthritis, unspecified site: Secondary | ICD-10-CM | POA: Diagnosis present

## 2017-10-23 DIAGNOSIS — Z859 Personal history of malignant neoplasm, unspecified: Secondary | ICD-10-CM | POA: Diagnosis not present

## 2017-10-23 DIAGNOSIS — K766 Portal hypertension: Secondary | ICD-10-CM | POA: Diagnosis present

## 2017-10-23 DIAGNOSIS — C7971 Secondary malignant neoplasm of right adrenal gland: Secondary | ICD-10-CM | POA: Diagnosis not present

## 2017-10-23 DIAGNOSIS — Z8249 Family history of ischemic heart disease and other diseases of the circulatory system: Secondary | ICD-10-CM

## 2017-10-23 DIAGNOSIS — R740 Nonspecific elevation of levels of transaminase and lactic acid dehydrogenase [LDH]: Secondary | ICD-10-CM | POA: Diagnosis not present

## 2017-10-23 DIAGNOSIS — K7581 Nonalcoholic steatohepatitis (NASH): Secondary | ICD-10-CM | POA: Diagnosis not present

## 2017-10-23 DIAGNOSIS — K769 Liver disease, unspecified: Secondary | ICD-10-CM | POA: Diagnosis not present

## 2017-10-23 DIAGNOSIS — F1911 Other psychoactive substance abuse, in remission: Secondary | ICD-10-CM | POA: Diagnosis present

## 2017-10-23 DIAGNOSIS — C220 Liver cell carcinoma: Secondary | ICD-10-CM | POA: Diagnosis not present

## 2017-10-23 DIAGNOSIS — I8500 Esophageal varices without bleeding: Secondary | ICD-10-CM | POA: Diagnosis present

## 2017-10-23 DIAGNOSIS — K743 Primary biliary cirrhosis: Secondary | ICD-10-CM

## 2017-10-23 DIAGNOSIS — I10 Essential (primary) hypertension: Secondary | ICD-10-CM | POA: Diagnosis not present

## 2017-10-23 DIAGNOSIS — K7689 Other specified diseases of liver: Secondary | ICD-10-CM | POA: Diagnosis not present

## 2017-10-23 DIAGNOSIS — C7972 Secondary malignant neoplasm of left adrenal gland: Secondary | ICD-10-CM | POA: Diagnosis present

## 2017-10-23 DIAGNOSIS — R945 Abnormal results of liver function studies: Secondary | ICD-10-CM | POA: Diagnosis not present

## 2017-10-23 DIAGNOSIS — Z809 Family history of malignant neoplasm, unspecified: Secondary | ICD-10-CM

## 2017-10-23 DIAGNOSIS — R932 Abnormal findings on diagnostic imaging of liver and biliary tract: Secondary | ICD-10-CM | POA: Diagnosis not present

## 2017-10-23 DIAGNOSIS — Z9861 Coronary angioplasty status: Secondary | ICD-10-CM

## 2017-10-23 DIAGNOSIS — F40240 Claustrophobia: Secondary | ICD-10-CM | POA: Diagnosis present

## 2017-10-23 DIAGNOSIS — E785 Hyperlipidemia, unspecified: Secondary | ICD-10-CM | POA: Diagnosis not present

## 2017-10-23 DIAGNOSIS — R7401 Elevation of levels of liver transaminase levels: Secondary | ICD-10-CM | POA: Diagnosis present

## 2017-10-23 DIAGNOSIS — E278 Other specified disorders of adrenal gland: Secondary | ICD-10-CM | POA: Diagnosis not present

## 2017-10-23 DIAGNOSIS — R110 Nausea: Secondary | ICD-10-CM | POA: Diagnosis present

## 2017-10-23 LAB — CBC WITH DIFFERENTIAL/PLATELET
Abs Immature Granulocytes: 0.04 10*3/uL (ref 0.00–0.07)
BASOS ABS: 0 10*3/uL (ref 0.0–0.1)
Basophils Relative: 0 %
EOS ABS: 0.4 10*3/uL (ref 0.0–0.5)
Eosinophils Relative: 4 %
HEMATOCRIT: 39.5 % (ref 39.0–52.0)
Hemoglobin: 13 g/dL (ref 13.0–17.0)
IMMATURE GRANULOCYTES: 1 %
LYMPHS ABS: 0.9 10*3/uL (ref 0.7–4.0)
Lymphocytes Relative: 10 %
MCH: 30.2 pg (ref 26.0–34.0)
MCHC: 32.9 g/dL (ref 30.0–36.0)
MCV: 91.6 fL (ref 80.0–100.0)
Monocytes Absolute: 0.8 10*3/uL (ref 0.1–1.0)
Monocytes Relative: 10 %
NEUTROS PCT: 75 %
NRBC: 0 % (ref 0.0–0.2)
Neutro Abs: 6.4 10*3/uL (ref 1.7–7.7)
PLATELETS: 273 10*3/uL (ref 150–400)
RBC: 4.31 MIL/uL (ref 4.22–5.81)
RDW: 16.6 % — AB (ref 11.5–15.5)
WBC: 8.5 10*3/uL (ref 4.0–10.5)

## 2017-10-23 LAB — URINALYSIS, ROUTINE W REFLEX MICROSCOPIC
Glucose, UA: >=500 mg/dL — AB
Hgb urine dipstick: NEGATIVE
KETONES UR: NEGATIVE mg/dL
LEUKOCYTES UA: NEGATIVE
NITRITE: NEGATIVE
PH: 6 (ref 5.0–8.0)
PROTEIN: NEGATIVE mg/dL
SPECIFIC GRAVITY, URINE: 1.03 (ref 1.005–1.030)

## 2017-10-23 LAB — PROTIME-INR
INR: 1.07
PROTHROMBIN TIME: 13.8 s (ref 11.4–15.2)

## 2017-10-23 LAB — COMPREHENSIVE METABOLIC PANEL
ALBUMIN: 3 g/dL — AB (ref 3.5–5.0)
ALT: 220 U/L — ABNORMAL HIGH (ref 0–44)
ANION GAP: 12 (ref 5–15)
AST: 225 U/L — ABNORMAL HIGH (ref 15–41)
Alkaline Phosphatase: 778 U/L — ABNORMAL HIGH (ref 38–126)
BILIRUBIN TOTAL: 16.9 mg/dL — AB (ref 0.3–1.2)
BUN: 13 mg/dL (ref 8–23)
CHLORIDE: 100 mmol/L (ref 98–111)
CO2: 22 mmol/L (ref 22–32)
Calcium: 9.6 mg/dL (ref 8.9–10.3)
Creatinine, Ser: 0.55 mg/dL — ABNORMAL LOW (ref 0.61–1.24)
GFR calc Af Amer: >60 mL/min (ref >60)
GFR calc non Af Amer: >60 mL/min (ref >60)
GLUCOSE: 120 mg/dL — AB (ref 70–99)
POTASSIUM: 4.2 mmol/L (ref 3.5–5.1)
SODIUM: 134 mmol/L — AB (ref 135–145)
TOTAL PROTEIN: 8.2 g/dL — AB (ref 6.5–8.1)

## 2017-10-23 LAB — LIPASE, BLOOD: Lipase: 27 U/L (ref 11–51)

## 2017-10-23 LAB — MONONUCLEOSIS SCREEN: Mono Screen: NEGATIVE

## 2017-10-23 LAB — AMMONIA: Ammonia: 47 umol/L — ABNORMAL HIGH (ref 9–35)

## 2017-10-23 MED ORDER — GABAPENTIN 400 MG PO CAPS
800.0000 mg | ORAL_CAPSULE | Freq: Every day | ORAL | Status: DC
  Administered 2017-10-24 – 2017-10-28 (×6): 800 mg via ORAL
  Filled 2017-10-23 (×6): qty 2

## 2017-10-23 MED ORDER — ACETAMINOPHEN 650 MG RE SUPP: 650.0000 mg | Freq: Four times a day (QID) | RECTAL | Status: DC | PRN

## 2017-10-23 MED ORDER — ACETAMINOPHEN 325 MG PO TABS: 650.0000 mg | ORAL_TABLET | Freq: Four times a day (QID) | ORAL | Status: DC | PRN

## 2017-10-23 MED ORDER — POLYVINYL ALCOHOL 1.4 % OP SOLN: 1.0000 [drp] | OPHTHALMIC | Status: DC | PRN

## 2017-10-23 MED ORDER — CALCIUM CARBONATE ANTACID 500 MG PO CHEW
1.0000 | CHEWABLE_TABLET | Freq: Two times a day (BID) | ORAL | Status: DC | PRN
  Filled 2017-10-23: qty 1

## 2017-10-23 MED ORDER — ONDANSETRON HCL 4 MG/2ML IJ SOLN
4.0000 mg | Freq: Four times a day (QID) | INTRAMUSCULAR | Status: DC | PRN
  Administered 2017-10-24 – 2017-10-26 (×3): 4 mg via INTRAVENOUS
  Filled 2017-10-23 (×3): qty 2

## 2017-10-23 MED ORDER — ONDANSETRON HCL 4 MG PO TABS: 4.0000 mg | ORAL_TABLET | Freq: Four times a day (QID) | ORAL | Status: DC | PRN

## 2017-10-23 MED ORDER — INSULIN DETEMIR 100 UNIT/ML ~~LOC~~ SOLN
41.0000 [IU] | Freq: Every day | SUBCUTANEOUS | Status: DC
  Administered 2017-10-24 – 2017-10-29 (×5): 41 [IU] via SUBCUTANEOUS
  Filled 2017-10-23 (×8): qty 0.41

## 2017-10-23 MED ORDER — ENOXAPARIN SODIUM 40 MG/0.4ML ~~LOC~~ SOLN
40.0000 mg | Freq: Every day | SUBCUTANEOUS | Status: DC
  Administered 2017-10-24: 40 mg via SUBCUTANEOUS
  Filled 2017-10-23: qty 0.4

## 2017-10-23 MED ORDER — LISINOPRIL 20 MG PO TABS
20.0000 mg | ORAL_TABLET | Freq: Every day | ORAL | Status: DC
  Administered 2017-10-24 – 2017-10-26 (×2): 20 mg via ORAL
  Filled 2017-10-23 (×3): qty 1

## 2017-10-23 MED ORDER — INSULIN DETEMIR 100 UNIT/ML ~~LOC~~ SOLN
6.0000 [IU] | Freq: Every day | SUBCUTANEOUS | Status: DC
  Administered 2017-10-24 – 2017-10-28 (×6): 6 [IU] via SUBCUTANEOUS
  Filled 2017-10-23 (×7): qty 0.06

## 2017-10-23 MED ORDER — ADULT MULTIVITAMIN W/MINERALS CH
1.0000 | ORAL_TABLET | Freq: Every day | ORAL | Status: DC
  Administered 2017-10-24 – 2017-10-29 (×5): 1 via ORAL
  Filled 2017-10-23 (×5): qty 1

## 2017-10-23 MED ORDER — INSULIN ASPART 100 UNIT/ML ~~LOC~~ SOLN
0.0000 [IU] | SUBCUTANEOUS | Status: DC
  Administered 2017-10-24: 1 [IU] via SUBCUTANEOUS
  Administered 2017-10-25: 2 [IU] via SUBCUTANEOUS

## 2017-10-23 NOTE — ED Notes (Signed)
ED TO INPATIENT HANDOFF REPORT  Name/Age/Gender Matthew Lewis 62 y.o. male  Code Status    Code Status Orders  (From admission, onward)         Start     Ordered   10/23/17 2255  Full code  Continuous     10/23/17 2256        Code Status History    Date Active Date Inactive Code Status Order ID Comments User Context   02/24/2017 1951 02/25/2017 1725 DNR [id number]  Karmen Bongo, MD Inpatient   06/12/2016 2021 06/14/2016 2149 Full Code [id number]  Vianne Bulls, MD ED   11/30/2012 2030 12/02/2012 2057 Full Code [id number]  Louellen Molder, MD Inpatient   08/15/2011 1827 08/19/2011 1821 Full Code [id number]  Kathalene Frames, MD ED   06/05/2011 1927 06/08/2011 1410 Full Code [id number]  Whitaker, Marilynn Rail, RN Inpatient      Home/SNF/Other Pt given to floor  Chief Complaint abnormal labs   Level of Care/Admitting Diagnosis ED Disposition    ED Disposition Condition [place]: [facility] [[id number]]  Level of Care: Med-Surg [16]  Diagnosis: Obstructive jaundice [159458]  Admitting Physician: Etta Quill [[id number]]  Attending Physician: Etta Quill [phone]  Estimated length of stay: past midnight tomorrow  Certification:: I certify this patient will need inpatient services for at least 2 midnights  PT Class (Do Not Modify): Inpatient [101]  PT Acc Code (Do Not Modify): Private [1]       Medical History Past Medical History:  Diagnosis Date  . Abscess of anal and rectal regions    horse shoe abscess  . Alcoholic cirrhosis ([place])   . Arthritis    "everywhere; especially in my spine"  . Cirrhosis ([place])    STAGE 1 CIRRHOSIS PATIENT SEES DR NAMDIGAM FOR  . Colon cancer ([place]) 05/2005   TX SURGERY WITH LYMPH NODE REMOVAL  . Cough    LAST FEW WEEKS SAW DR RAMOS LUNGS MILKY SPUTUM OCC  . DM type 2 with diabetic peripheral neuropathy (H[facility])    left foot  . Fatigue   . Fistula, anal    multiple  . History of substance abuse ([place])    last alcohol was 08/2011; last marijuania was 08/2011  . Hypertension   . PTSD (post-traumatic stress disorder)    "severe" HX OF  . Sleep deprivation     Allergies Allergies  Allergen Reactions  . Other Other (See Comments)    Patient is recovering from drug addiction does not want any narcotics    IV Location/Drains/Wounds Patient Lines/Drains/Airways Status   Active Line/Drains/Airways    Name:   Placement date:   Placement time:   Site:   Days:   Peripheral IV 10/23/17 Right Forearm   10/23/17    1848    Forearm   less than 1   Open Drain Rectal   02/24/17    1801    Rectal   241   Open Drain 1 Right Other (Comment)   09/05/17    0949    Other (Comment)   48   Incision (Closed) 02/24/17 Rectum   02/24/17    1812     241          Labs/Imaging Results for orders placed or performed during the hospital encounter of 10/23/17 (from the past 48 hour(s))  Comprehensive metabolic panel     Status: Abnormal   Collection Time: 10/23/17  5:46 PM  Result Value Ref Range   Sodium 134 (L) 135 - 145 mmol/L   Potassium 4.2 3.5 - 5.1 mmol/L   Chloride 100 98 - 111 mmol/L   CO2 22 22 - 32 mmol/L   Glucose, Bld 120 (H) 70 - 99 mg/dL   BUN 13 8 - 23 mg/dL   Creatinine, Ser 0.55 (L) 0.61 - 1.24 mg/dL   Calcium 9.6 8.9 - 10.3 mg/dL   Total Protein 8.2 (H) 6.5 - 8.1 g/dL   Albumin 3.0 (L) 3.5 - 5.0 g/dL   AST 225 (H) 15 - 41 U/L   ALT 220 (H) 0 - 44 U/L   Alkaline Phosphatase 778 (H) 38 - 126 U/L   Total Bilirubin 16.9 (H) 0.3 - 1.2 mg/dL   GFR calc non Af Amer >60 >60 mL/min   GFR calc Af Amer >60 >60 mL/min    Comment: (NOTE) The eGFR has been calculated using the CKD EPI equation. This calculation has not been validated in all clinical situations. eGFR's persistently <60 mL/min signify possible Chronic Kidney Disease.    Anion gap 12 5 - 15    Comment: Performed at [facility], [place] [address], [place], [place] [address]  Lipase, blood     Status: None    Collection Time: 10/23/17  5:46 PM  Result Value Ref Range   Lipase 27 11 - 51 U/L    Comment: Performed at [facility], [place] [address] [place], [place] [address]  CBC with Diff     Status: Abnormal   Collection Time: 10/23/17  5:46 PM  Result Value Ref Range   WBC 8.5 4.0 - 10.5 K/uL   RBC 4.31 4.22 - 5.81 MIL/uL   Hemoglobin 13.0 13.0 - 17.0 g/dL   HCT 39.5 39.0 - 52.0 %   MCV 91.6 80.0 - 100.0 fL   MCH 30.2 26.0 - 34.0 pg   MCHC 32.9 30.0 - 36.0 g/dL   RDW 16.6 (H) 11.5 - 15.5 %   Platelets 273 150 - 400 K/uL   nRBC 0.0 0.0 - 0.2 %   Neutrophils Relative % 75 %   Neutro Abs 6.4 1.7 - 7.7 K/uL   Lymphocytes Relative 10 %   Lymphs Abs 0.9 0.7 - 4.0 K/uL   Monocytes Relative 10 %   Monocytes Absolute 0.8 0.1 - 1.0 K/uL   Eosinophils Relative 4 %   Eosinophils Absolute 0.4 0.0 - 0.5 K/uL   Basophils Relative 0 %   Basophils Absolute 0.0 0.0 - 0.1 K/uL   Immature Granulocytes 1 %   Abs Immature Granulocytes 0.04 0.00 - 0.07 K/uL    Comment: Performed at [facility], [place] [address], [place], [place] [address]  Urinalysis, Routine w reflex microscopic     Status: Abnormal   Collection Time: 10/23/17  7:16 PM  Result Value Ref Range   Color, Urine AMBER (A) YELLOW    Comment: BIOCHEMICALS MAY BE AFFECTED BY COLOR   APPearance CLEAR CLEAR   Specific Gravity, Urine 1.030 1.005 - 1.030   pH 6.0 5.0 - 8.0   Glucose, UA >=500 (A) NEGATIVE mg/dL   Hgb urine dipstick NEGATIVE NEGATIVE   Bilirubin Urine MODERATE (A) NEGATIVE   Ketones, ur NEGATIVE NEGATIVE mg/dL   Protein, ur NEGATIVE NEGATIVE mg/dL   Nitrite NEGATIVE NEGATIVE   Leukocytes, UA NEGATIVE NEGATIVE   RBC / HPF 0-5 0 - 5 RBC/hpf   WBC, UA 6-10 0 - 5 WBC/hpf  Bacteria, UA RARE (A) NONE SEEN   Squamous Epithelial / LPF 0-5 0 - 5   Mucus PRESENT     Comment: Performed at [facility], [place] [address], [place], [place] [address]  Protime-INR     Status: None    Collection Time: 10/23/17  7:18 PM  Result Value Ref Range   Prothrombin Time 13.8 11.4 - 15.2 seconds   INR 1.07     Comment: Performed at [facility], [place] [address], [place], [place] [address]  Ammonia     Status: Abnormal   Collection Time: 10/23/17  7:18 PM  Result Value Ref Range   Ammonia 47 (H) 9 - 35 umol/L    Comment: Performed at [facility], [place] [address] [place], [place] [address]  Mononucleosis screen     Status: None   Collection Time: 10/23/17  9:27 PM  Result Value Ref Range   Mono Screen NEGATIVE NEGATIVE    Comment: Performed at [facility], [place] [address] [place], [place] [address]   US Abdomen Complete  Result Date: 10/23/2017 CLINICAL DATA:  Jaundiced. EXAM: ABDOMEN ULTRASOUND COMPLETE COMPARISON:  Body CT 02/24/2017 FINDINGS: Gallbladder: No gallstones seen. There is gallbladder wall thickening measuring 6 mm. No sonographic Murphy sign noted by sonographer. Common bile duct: Diameter: 6.3 mm Liver: Coarse nodular echogenicity of the liver. Area of heterogeneity or an ill-defined mass in segment 6 of the liver measures 2.6 x 2.4 x 2.2 cm. Portal vein is patent on color Doppler imaging with normal direction of blood flow towards the liver. IVC: No abnormality visualized. Pancreas: Visualized portion unremarkable. Spleen: Splenomegaly. Splenic length is 16.2 cm with volume of 994 cubic cm. Right Kidney: Length: 14.8 cm. Echogenicity within normal limits. No mass or hydronephrosis visualized. Left Kidney: Length: 14.7 cm. Echogenicity within normal limits. No mass or hydronephrosis visualized. Abdominal aorta: No aneurysm visualized. Other findings: None. IMPRESSION: Cirrhotic appearance of the liver. 2.6 cm questionable ill-defined mass versus area of focal parenchymal heterogeneity in segment 6 of the liver. If further imaging evaluation is desired, liver protocol abdominal MRI should be considered. Splenomegaly.  Apparent thickening of the gallbladder wall, without evidence of cholelithiasis, and with negative sonographic Murphy's sign. The findings are favored to represent gallbladder wall edema secondary to systemic causes such as hyperproteinemia rather than acute cholecystitis. Please correlate clinically. Electronically Signed   By: Fidela Salisbury M.D.   On: 10/23/2017 20:58    Pending Labs Unresulted Labs (From admission, onward)    Start     Ordered   10/24/17 0500  Comprehensive metabolic panel  Tomorrow morning,   R     10/23/17 2256   10/24/17 0500  CBC  Tomorrow morning,   R     10/23/17 2256   10/23/17 2251  HIV antibody (Routine Testing)  Once,   R     10/23/17 2256   10/23/17 2128  AFP tumor marker  Once,   R     10/23/17 2127   10/23/17 2127  Hepatitis panel, acute  STAT,   STAT     10/23/17 2126          Vitals/Pain Today's Vitals   10/23/17 1822 10/23/17 1857 10/23/17 2000 10/23/17 2053  BP:  112/75 109/67 109/67  Pulse:  (!) 105 98 98  Resp:  '18 17 16  ' Temp:      TempSrc:      SpO2:  99% 99% 96%  Weight:  Height:      PainSc: 6        Isolation Precautions No active isolations  Medications Medications  calcium carbonate (TUMS - dosed in mg elemental calcium) chewable tablet 200 mg of elemental calcium (has no administration in time range)  gabapentin (NEURONTIN) capsule 400-800 mg (has no administration in time range)  lisinopril (PRINIVIL,ZESTRIL) tablet 20 mg (has no administration in time range)  multivitamin tablet 1 tablet (has no administration in time range)  polyvinyl alcohol (LIQUIFILM TEARS) 1.4 % ophthalmic solution 1 drop (has no administration in time range)  insulin detemir (LEVEMIR) injection 6 Units (has no administration in time range)  insulin detemir (LEVEMIR) injection 41 Units (has no administration in time range)  insulin aspart (novoLOG) injection 0-9 Units (has no administration in time range)  ondansetron (ZOFRAN) tablet 4 mg  (has no administration in time range)    Or  ondansetron (ZOFRAN) injection 4 mg (has no administration in time range)  enoxaparin (LOVENOX) injection 40 mg (has no administration in time range)    Mobility walks

## 2017-10-23 NOTE — ED Notes (Addendum)
Urinal labeled specimen cup and culture provided at bedside for pt for U/A collection. [employer]

## 2017-10-23 NOTE — ED Provider Notes (Signed)
[facility] DEPT Provider Note   CSN: [id number] Arrival date & time: 10/23/17  1702     History   Chief Complaint Chief Complaint  Patient presents with  . abnormal labs    HPI Matthew Lewis is a 62 y.o. male.  The history is provided by the patient. No language interpreter was used.   Matthew Lewis is a 62 y.o. male who presents to the Emergency Department complaining of abnormal labs. He presents to the emergency department for abnormal labs. He has been feeling poorly since August of this year with generalized body aches and malaise. He reports nausea with diarrhea for the last few weeks. He is experienced a 20 pound weight loss in the last month. No reports of fevers, vomiting, dysuria. Over the last two weeks his urine has been very dark. He went to see his primary care doctor today and he had outpatient labs performed. He was told to present to the emergency department for elevation of his liver enzymes. He does note that he is a yellow appearing for the last few weeks. He was started on vancomycin for C diff today. Past Medical History:  Diagnosis Date  . Abscess of anal and rectal regions    horse shoe abscess  . Alcoholic cirrhosis ([place])   . Arthritis    "everywhere; especially in my spine"  . Cirrhosis ([place])    STAGE 1 CIRRHOSIS PATIENT SEES DR NAMDIGAM FOR  . Colon cancer ([place]) 05/2005   TX SURGERY WITH LYMPH NODE REMOVAL  . Cough    LAST FEW WEEKS SAW DR RAMOS LUNGS MILKY SPUTUM OCC  . DM type 2 with diabetic peripheral neuropathy (H[facility])    left foot  . Fatigue   . Fistula, anal    multiple  . History of substance abuse ([place])    last alcohol was 08/2011; last marijuania was 08/2011  . Hypertension   . PTSD (post-traumatic stress disorder)    "severe" HX OF  . Sleep deprivation     Patient Active Problem List   Diagnosis Date Noted  . Obstructive jaundice 10/23/2017  . Transaminitis 10/23/2017  . Liver lesion 10/23/2017    . History of colon cancer 10/23/2017  . Perirectal abscess 02/24/2017  . Moderate protein-calorie malnutrition ([place]) 02/24/2017  . Sepsis ([place]) 02/24/2017  . Pulmonary nodule 02/24/2017  . Alcoholic cirrhosis of liver without ascites ([place]) 02/24/2017  . Duodenitis 06/13/2016  . Hyponatremia 06/12/2016  . Nausea & vomiting 06/12/2016  . Thrombocytopenia ([place]) 06/12/2016  . Acute gastroenteritis 06/12/2016  . PTSD (post-traumatic stress disorder) 12/02/2012  . Hx of substance abuse- clean several years now 12/02/2012  . Chest pain with moderate risk of acute coronary syndrome 11/30/2012  . Obesity 11/30/2012  . Hypertension 11/30/2012  . Hyperlipemia 11/30/2012  . Diabetes mellitus ([place]) 11/30/2012  . CAD (coronary artery disease)- PCI 2001- no evaluation since 11/30/2012    Past Surgical History:  Procedure Laterality Date  . ABSCESS DRAINAGE     "probably 15-20 so far at least; rectal"  . ANAL FISTULECTOMY  12/13/1998  . ANTERIOR FUSION CERVICAL SPINE  2010   "triple"; "did 2 surgeries on the same day in 3419 due to complications"  . CORONARY ANGIOPLASTY  2001  . detached muscle  2010   right chest; "after cervical fusion complications"  . ELBOW SURGERY  2007   "cut out part of a muscle"; left  . EXAMINATION UNDER ANESTHESIA  03/22/2005   fistula  . EXAMINATION  UNDER ANESTHESIA  06/06/2011   Procedure: EXAM UNDER ANESTHESIA;  Surgeon: Stark Klein, MD;  Location: [facility];  Service: General;  Laterality: N/A;  . HEMICOLECTOMY  06/06/2005   left  . INCISION AND DRAINAGE PERIRECTAL ABSCESS  03/30/2005  . INCISION AND DRAINAGE PERIRECTAL ABSCESS  12/13/2004  . INCISION AND DRAINAGE PERIRECTAL ABSCESS  07/14/2001  . INCISION AND DRAINAGE PERIRECTAL ABSCESS  08/12/2010   horseshoe abscess; Dr Redmond Pulling  . INCISION AND DRAINAGE PERIRECTAL ABSCESS  06/06/2011   Procedure: IRRIGATION AND DEBRIDEMENT PERIRECTAL ABSCESS;  Surgeon: Stark Klein, MD;  Location: [facility];  Service: General;   Laterality: N/A;  . INCISION AND DRAINAGE PERIRECTAL ABSCESS N/A 02/24/2017   Procedure: IRRIGATION AND DEBRIDEMENT PERIRECTAL ABSCESS;  Surgeon: Ralene Ok, MD;  Location: [facility];  Service: General;  Laterality: N/A;  . LUMBAR [place]  . PERCUTANEOUS PINNING PHALANX FRACTURE OF HAND  ~ 2008   "plates in 2 places"; right  . PLACEMENT OF SETON N/A 09/05/2017   Procedure: PLACEMENT OF SETON;  Surgeon: Leighton Ruff, MD;  Location: [facility];  Service: General;  Laterality: N/A;  . THORACIC DISCECTOMY  1990's        Home Medications    Prior to Admission medications   Medication Sig Start Date End Date Taking? Authorizing Provider  aspirin EC 81 MG tablet Take 81 mg by mouth daily.   Yes [provider]  atorvastatin (LIPITOR) 40 MG tablet Take 1 tablet (40 mg total) by mouth daily at 6 PM. Patient taking differently: Take 20 mg by mouth daily at 6 PM.  12/02/12  Yes Viyuoh, Adeline C, MD  calcium carbonate (TUMS - DOSED IN MG ELEMENTAL CALCIUM) 500 MG chewable tablet Chew 1 tablet by mouth 2 (two) times daily as needed for indigestion or heartburn.   Yes [provider]  celecoxib (CELEBREX) 200 MG capsule Take 1 capsule by mouth daily. 10/10/17  Yes [provider]  empagliflozin (JARDIANCE) 10 MG TABS tablet Take 10 mg by mouth at bedtime.    Yes [provider]  Fexofenadine-Pseudoephedrine (ALLEGRA-D 24 HOUR PO) Take by mouth daily. WAL/FEX   Yes [provider]  gabapentin (NEURONTIN) 400 MG capsule Take 400-800 mg by mouth 2 (two) times daily. 1 capsule am, 2 capsules pm   Yes [provider]  glipiZIDE (GLUCOTROL) 5 MG tablet Take 5 mg by mouth 2 (two) times daily before a meal.   Yes [provider]  Insulin Detemir (LEVEMIR FLEXPEN) 100 UNIT/ML Pen Inject 10-60 Units into the skin 2 (two) times daily. Take 62 units in the am and Take 10 units in the pm   Yes [provider]    lisinopril (PRINIVIL,ZESTRIL) 20 MG tablet Take 20 mg by mouth daily.   Yes [provider]  metFORMIN (GLUCOPHAGE) 1000 MG tablet Take 1,000 mg by mouth 2 (two) times daily with a meal.   Yes [provider]  Multiple Vitamin (MULTIVITAMIN) tablet Take 1 tablet by mouth daily.   Yes [provider]  polyvinyl alcohol (LIQUIFILM TEARS) 1.4 % ophthalmic solution 1 drop as needed for dry eyes.   Yes [provider]  vancomycin (VANCOCIN) 125 MG capsule Take 1 capsule by mouth every 6 (six) hours. 10/23/17  Yes [provider]    Family History Family History  Problem Relation Age of Onset  . Heart disease Father   . Heart disease Mother   . Cancer Mother   . Cancer  Sister   . Heart disease Brother     Social History Social History   Tobacco Use  . Smoking status: Former Smoker    Packs/day: 1.00    Years: 10.00    Pack years: 10.00    Types: Cigarettes    Last attempt to quit: 01/08/1991    Years since quitting: 26.8  . Smokeless tobacco: Never Used  Substance Use Topics  . Alcohol use: Yes    Comment: 06/05/11 "drank enough when I was younger to last me my whole life; don't remember when I had my last drink, maybe 2012"  . Drug use: Not Currently    Types: Oxycodone, Marijuana    Comment: 06/05/11 "pot head in my younger days; 2009 dr had me on oxycodone and valium for almost 23yr     Allergies   Other   Review of Systems Review of Systems  All other systems reviewed and are negative.    Physical Exam Updated Vital Signs BP 117/79   Pulse 92   Temp 98.1 F (36.7 C)   Resp 18   Ht 5' 10.5" (1.791 m)   Wt 103 kg   SpO2 99%   BMI 32.11 kg/m   Physical Exam  Constitutional: He is oriented to person, place, and time. He appears well-developed and well-nourished.  HENT:  Head: Normocephalic and atraumatic.  Eyes: Scleral icterus is present.  Cardiovascular: Normal rate and regular rhythm.  No murmur  heard. Pulmonary/Chest: Effort normal and breath sounds normal. No respiratory distress.  Abdominal: Soft. There is no rebound and no guarding.  Mild epigastric tenderness  Musculoskeletal: He exhibits no edema or tenderness.  Neurological: He is alert and oriented to person, place, and time.  Skin: Skin is warm and dry.  Psychiatric: He has a normal mood and affect. His behavior is normal.  Nursing note and vitals reviewed.    ED Treatments / Results  Labs (all labs ordered are listed, but only abnormal results are displayed) Labs Reviewed  COMPREHENSIVE METABOLIC PANEL - Abnormal; Notable for the following components:      Result Value   Sodium 134 (*)    Glucose, Bld 120 (*)    Creatinine, Ser 0.55 (*)    Total Protein 8.2 (*)    Albumin 3.0 (*)    AST 225 (*)    ALT 220 (*)    Alkaline Phosphatase 778 (*)    Total Bilirubin 16.9 (*)    All other components within normal limits  CBC WITH DIFFERENTIAL/PLATELET - Abnormal; Notable for the following components:   RDW 16.6 (*)    All other components within normal limits  URINALYSIS, ROUTINE W REFLEX MICROSCOPIC - Abnormal; Notable for the following components:   Color, Urine AMBER (*)    Glucose, UA >=500 (*)    Bilirubin Urine MODERATE (*)    Bacteria, UA RARE (*)    All other components within normal limits  AMMONIA - Abnormal; Notable for the following components:   Ammonia 47 (*)    All other components within normal limits  LIPASE, BLOOD  PROTIME-INR  MONONUCLEOSIS SCREEN  GLUCOSE, CAPILLARY  HEPATITIS PANEL, ACUTE  AFP TUMOR MARKER  HIV ANTIBODY (ROUTINE TESTING W REFLEX)  COMPREHENSIVE METABOLIC PANEL  CBC    EKG None  Radiology [place]dom[place] Complete  Result Date: 10/23/2017 CLINICAL DATA:  Jaundiced. EXAM: ABDOMEN ULTRASOUND COMPLETE COMPARISON:  Body CT 02/24/2017 FINDINGS: Gallbladder: No gallstones seen. There is gallbladder wall thickening measuring 6 mm. No sonographic MPercell Miller  sign noted by  sonographer. Common bile duct: Diameter: 6.3 mm Liver: Coarse nodular echogenicity of the liver. Area of heterogeneity or an ill-defined mass in segment 6 of the liver measures 2.6 x 2.4 x 2.2 cm. Portal vein is patent on color Doppler imaging with normal direction of blood flow towards the liver. IVC: No abnormality visualized. Pancreas: Visualized portion unremarkable. Spleen: Splenomegaly. Splenic length is 16.2 cm with volume of 994 cubic cm. Right Kidney: Length: 14.8 cm. Echogenicity within normal limits. No mass or hydronephrosis visualized. Left Kidney: Length: 14.7 cm. Echogenicity within normal limits. No mass or hydronephrosis visualized. Abdominal aorta: No aneurysm visualized. Other findings: None. IMPRESSION: Cirrhotic appearance of the liver. 2.6 cm questionable ill-defined mass versus area of focal parenchymal heterogeneity in segment 6 of the liver. If further imaging evaluation is desired, liver protocol abdominal MRI should be considered. Splenomegaly. Apparent thickening of the gallbladder wall, without evidence of cholelithiasis, and with negative sonographic Murphy's sign. The findings are favored to represent gallbladder wall edema secondary to systemic causes such as hyperproteinemia rather than acute cholecystitis. Please correlate clinically. Electronically Signed   By: Fidela Salisbury M.D.   On: 10/23/2017 20:58    Procedures Procedures (including critical care time)  Medications Ordered in ED Medications  calcium carbonate (TUMS - dosed in mg elemental calcium) chewable tablet 200 mg of elemental calcium (has no administration in time range)  gabapentin (NEURONTIN) capsule 800 mg (800 mg Oral Given 10/24/17 0017)  lisinopril (PRINIVIL,ZESTRIL) tablet 20 mg (has no administration in time range)  multivitamin with minerals tablet 1 tablet (has no administration in time range)  polyvinyl alcohol (LIQUIFILM TEARS) 1.4 % ophthalmic solution 1 drop (has no administration in time  range)  insulin detemir (LEVEMIR) injection 6 Units (has no administration in time range)  insulin detemir (LEVEMIR) injection 41 Units (has no administration in time range)  insulin aspart (novoLOG) injection 0-9 Units (0 Units Subcutaneous Not Given 10/24/17 0011)  ondansetron (ZOFRAN) tablet 4 mg (has no administration in time range)    Or  ondansetron (ZOFRAN) injection 4 mg (has no administration in time range)  enoxaparin (LOVENOX) injection 40 mg (has no administration in time range)  gabapentin (NEURONTIN) capsule 400 mg (has no administration in time range)     Initial Impression / Assessment and Plan / ED Course  I have reviewed the triage vital signs and the nursing notes.  Pertinent labs & imaging results that were available during my care of the patient were reviewed by me and considered in my medical decision making (see chart for details).    Patient referred to the emergency department for lab abnormality, progressive jaundice. He is jaundiced on examination but non-toxic appearing. Labs concerning for obstruction based on pattern. Abdominal ultrasound with no evidence of obstruction, possible hepatic mass.  D/w Dr. Havery Moros with [facility], will see the patient in consult.  Recommends MRI/MRCP, hepatitis panel, AFP.  If patient spikes a fever recommends empiric antibiotics. Hospitalist consulted for admission for further treatment. Final Clinical Impressions(s) / ED Diagnoses   Final diagnoses:  None    ED Discharge Orders    None       Quintella Reichert, MD 10/24/17 [phone]

## 2017-10-23 NOTE — H&P (Signed)
History and Physical    Matthew Lewis YJE:[id number] DOB: 04-Apr-1955 DOA: 10/23/2017  PCP: [name], MD  Patient coming from: Home  I have personally briefly reviewed patient's old medical records in [place]  Chief Complaint: Jaundice  HPI: Matthew Lewis is a 62 y.o. male with medical history significant of DM2, HTN, prior EtOH abuse but clean for several years now, colon cancer 13 years ago treated surgically, EtOH cirrhosis stage 1.  Patient presents to ED for abnormal labs.  He has been feeling poorly since Aug this year.  Generalized body aches, malaise.  Had 2 weeks of nausea and diarrhea for which his PCP started him on empiric vanc for.  20 lb wt loss, jaundice, and dark urine over past 2 weeks.  Saw PCP today, outpt labs performed, started on empiric vanc for C.Diff today.  Outpt labs came back with elevated liver enzymes and he was told to present to ED.   ED Course: Tbili 16.9, AST 225, ALT220, ALK 778   Review of Systems: As per HPI otherwise 10 point review of systems negative.   Past Medical History:  Diagnosis Date  . Abscess of anal and rectal regions    horse shoe abscess  . Alcoholic cirrhosis ([place])   . Arthritis    "everywhere; especially in my spine"  . Cirrhosis ([place])    STAGE 1 CIRRHOSIS PATIENT SEES DR NAMDIGAM FOR  . Colon cancer ([place]) 05/2005   TX SURGERY WITH LYMPH NODE REMOVAL  . Cough    LAST FEW WEEKS SAW DR RAMOS LUNGS MILKY SPUTUM OCC  . DM type 2 with diabetic peripheral neuropathy (HCC)    left foot  . Fatigue   . Fistula, anal    multiple  . History of substance abuse ([place])    last alcohol was 08/2011; last marijuania was 08/2011  . Hypertension   . PTSD (post-traumatic stress disorder)    "severe" HX OF  . Sleep deprivation     Past Surgical History:  Procedure Laterality Date  . ABSCESS DRAINAGE     "probably 15-20 so far at least; rectal"  . ANAL FISTULECTOMY  12/13/1998  . ANTERIOR FUSION CERVICAL SPINE   2010   "triple"; "did 2 surgeries on the same day in 6378 due to complications"  . CORONARY ANGIOPLASTY  2001  . detached muscle  2010   right chest; "after cervical fusion complications"  . ELBOW SURGERY  2007   "cut out part of a muscle"; left  . EXAMINATION UNDER ANESTHESIA  03/22/2005   fistula  . EXAMINATION UNDER ANESTHESIA  06/06/2011   Procedure: EXAM UNDER ANESTHESIA;  Surgeon: Stark Klein, MD;  Location: [facility];  Service: General;  Laterality: N/A;  . HEMICOLECTOMY  06/06/2005   left  . INCISION AND DRAINAGE PERIRECTAL ABSCESS  03/30/2005  . INCISION AND DRAINAGE PERIRECTAL ABSCESS  12/13/2004  . INCISION AND DRAINAGE PERIRECTAL ABSCESS  07/14/2001  . INCISION AND DRAINAGE PERIRECTAL ABSCESS  08/12/2010   horseshoe abscess; Dr Redmond Pulling  . INCISION AND DRAINAGE PERIRECTAL ABSCESS  06/06/2011   Procedure: IRRIGATION AND DEBRIDEMENT PERIRECTAL ABSCESS;  Surgeon: Stark Klein, MD;  Location: [facility];  Service: General;  Laterality: N/A;  . INCISION AND DRAINAGE PERIRECTAL ABSCESS N/A 02/24/2017   Procedure: IRRIGATION AND DEBRIDEMENT PERIRECTAL ABSCESS;  Surgeon: Ralene Ok, MD;  Location: [facility];  Service: General;  Laterality: N/A;  . LUMBAR [place]  . PERCUTANEOUS PINNING PHALANX FRACTURE OF HAND  ~ 2008   "  plates in 2 places"; right  . PLACEMENT OF SETON N/A 09/05/2017   Procedure: PLACEMENT OF SETON;  Surgeon: Leighton Ruff, MD;  [facility];  Service: General;  Laterality: N/A;  . THORACIC DISCECTOMY  1990's     reports that he quit smoking about 26 years ago. His smoking use included cigarettes. He has a 10.00 pack-year smoking history. He has never used smokeless tobacco. He reports that he drinks alcohol. He reports that he has current or past drug history. Drugs: Oxycodone and Marijuana.  Allergies  Allergen Reactions  . Other Other (See Comments)    Patient is recovering from drug addiction does not want any narcotics    Family History   Problem Relation Age of Onset  . Heart disease Father   . Heart disease Mother   . Cancer Mother   . Cancer Sister   . Heart disease Brother      Prior to Admission medications   Medication Sig Start Date End Date Taking? Authorizing Provider  aspirin EC 81 MG tablet Take 81 mg by mouth daily.   Yes [provider]  atorvastatin (LIPITOR) 40 MG tablet Take 1 tablet (40 mg total) by mouth daily at 6 PM. Patient taking differently: Take 20 mg by mouth daily at 6 PM.  12/02/12  Yes Viyuoh, Adeline C, MD  calcium carbonate (TUMS - DOSED IN MG ELEMENTAL CALCIUM) 500 MG chewable tablet Chew 1 tablet by mouth 2 (two) times daily as needed for indigestion or heartburn.   Yes [provider]  celecoxib (CELEBREX) 200 MG capsule Take 1 capsule by mouth daily. 10/10/17  Yes [provider]  empagliflozin (JARDIANCE) 10 MG TABS tablet Take 10 mg by mouth at bedtime.    Yes [provider]  Fexofenadine-Pseudoephedrine (ALLEGRA-D 24 HOUR PO) Take by mouth daily. WAL/FEX   Yes [provider]  gabapentin (NEURONTIN) 400 MG capsule Take 400-800 mg by mouth 2 (two) times daily. 1 capsule am, 2 capsules pm   Yes [provider]  glipiZIDE (GLUCOTROL) 5 MG tablet Take 5 mg by mouth 2 (two) times daily before a meal.   Yes [provider]  Insulin Detemir (LEVEMIR FLEXPEN) 100 UNIT/ML Pen Inject 10-60 Units into the skin 2 (two) times daily. Take 62 units in the am and Take 10 units in the pm   Yes [provider]  lisinopril (PRINIVIL,ZESTRIL) 20 MG tablet Take 20 mg by mouth daily.   Yes [provider]  metFORMIN (GLUCOPHAGE) 1000 MG tablet Take 1,000 mg by mouth 2 (two) times daily with a meal.   Yes [provider]  Multiple Vitamin (MULTIVITAMIN) tablet Take 1 tablet by mouth daily.   Yes [provider]  polyvinyl alcohol (LIQUIFILM TEARS) 1.4 % ophthalmic solution 1 drop as needed for dry eyes.   Yes  [provider]  vancomycin (VANCOCIN) 125 MG capsule Take 1 capsule by mouth every 6 (six) hours. 10/23/17  Yes [provider]    Physical Exam: Vitals:   10/23/17 1707 10/23/17 1857 10/23/17 2000 10/23/17 2053  BP: 131/81 112/75 109/67 109/67  Pulse: (!) 106 (!) 105 98 98  Resp: 15 18 17 16   Temp: 97.8 F (36.6 C)     TempSrc: Oral     SpO2: 99% 99% 99% 96%  Weight: 103 kg     Height: 5' 10.5" (1.791 m)       Constitutional: NAD, calm, comfortable Eyes: PERRL, lids and conjunctivae Icteric  ENMT: Mucous membranes are moist. Posterior pharynx clear of any exudate or lesions.Normal dentition.  Neck: normal, supple, no masses, no thyromegaly Respiratory: clear to auscultation bilaterally, no wheezing, no crackles. Normal respiratory effort. No accessory muscle use.  Cardiovascular: Regular rate and rhythm, no murmurs / rubs / gallops. No extremity edema. 2+ pedal pulses. No carotid bruits.  Abdomen: Mild epigastric tenderness, no masses palpated. No hepatosplenomegaly. Bowel sounds positive.  Musculoskeletal: no clubbing / cyanosis. No joint deformity upper and lower extremities. Good ROM, no contractures. Normal muscle tone.  Skin: no rashes, lesions, ulcers. No induration, Jaundiced Neurologic: CN 2-12 grossly intact. Sensation intact, DTR normal. Strength 5/5 in all 4.  Psychiatric: Normal judgment and insight. Alert and oriented x 3. Normal mood.    Labs on Admission: I have personally reviewed following labs and imaging studies  CBC: Recent Labs  Lab 10/23/17 1746  WBC 8.5  NEUTROABS 6.4  HGB 13.0  HCT 39.5  MCV 91.6  PLT 403   Basic Metabolic Panel: Recent Labs  Lab 10/23/17 1746  NA 134*  K 4.2  CL 100  CO2 22  GLUCOSE 120*  BUN 13  CREATININE 0.55*  CALCIUM 9.6   GFR: Estimated Creatinine Clearance: 113.1 mL/min (A) (by C-G formula based on SCr of 0.55 mg/dL (L)). Liver Function Tests: Recent Labs  Lab 10/23/17 1746  AST 225*    ALT 220*  ALKPHOS 778*  BILITOT 16.9*  PROT 8.2*  ALBUMIN 3.0*   Recent Labs  Lab 10/23/17 1746  LIPASE 27   Recent Labs  Lab 10/23/17 1918  AMMONIA 47*   Coagulation Profile: Recent Labs  Lab 10/23/17 1918  INR 1.07   Cardiac Enzymes: No results for input(s): CKTOTAL, CKMB, CKMBINDEX, TROPONINI in the last 168 hours. BNP (last 3 results) No results for input(s): PROBNP in the last 8760 hours. HbA1C: No results for input(s): HGBA1C in the last 72 hours. CBG: No results for input(s): GLUCAP in the last 168 hours. Lipid Profile: No results for input(s): CHOL, HDL, LDLCALC, TRIG, CHOLHDL, LDLDIRECT in the last 72 hours. Thyroid Function Tests: No results for input(s): TSH, T4TOTAL, FREET4, T3FREE, THYROIDAB in the last 72 hours. Anemia Panel: No results for input(s): VITAMINB12, FOLATE, FERRITIN, TIBC, IRON, RETICCTPCT in the last 72 hours. Urine analysis:    Component Value Date/Time   COLORURINE AMBER (A) 10/23/2017 1916   APPEARANCEUR CLEAR 10/23/2017 1916   LABSPEC 1.030 10/23/2017 1916   PHURINE 6.0 10/23/2017 1916   GLUCOSEU >=500 (A) 10/23/2017 1916   HGBUR NEGATIVE 10/23/2017 1916   BILIRUBINUR MODERATE (A) 10/23/2017 1916   KETONESUR NEGATIVE 10/23/2017 1916   PROTEINUR NEGATIVE 10/23/2017 1916   UROBILINOGEN 0.2 08/15/2011 1816   NITRITE NEGATIVE 10/23/2017 1916   LEUKOCYTESUR NEGATIVE 10/23/2017 1916    Radiological Exams on Admission: US Abdomen Complete  Result Date: 10/23/2017 CLINICAL DATA:  Jaundiced. EXAM: ABDOMEN ULTRASOUND COMPLETE COMPARISON:  Body CT 02/24/2017 FINDINGS: Gallbladder: No gallstones seen. There is gallbladder wall thickening measuring 6 mm. No sonographic Murphy sign noted by sonographer. Common bile duct: Diameter: 6.3 mm Liver: Coarse nodular echogenicity of the liver. Area of heterogeneity or an ill-defined mass in segment 6 of the liver measures 2.6 x 2.4 x 2.2 cm. Portal vein is patent on color Doppler imaging with  normal direction of blood flow towards the liver. IVC: No abnormality visualized. Pancreas: Visualized portion unremarkable. Spleen: Splenomegaly. Splenic length is 16.2 cm with volume of 994 cubic cm. Right Kidney: Length: 14.8 cm. Echogenicity within  normal limits. No mass or hydronephrosis visualized. Left Kidney: Length: 14.7 cm. Echogenicity within normal limits. No mass or hydronephrosis visualized. Abdominal aorta: No aneurysm visualized. Other findings: None. IMPRESSION: Cirrhotic appearance of the liver. 2.6 cm questionable ill-defined mass versus area of focal parenchymal heterogeneity in segment 6 of the liver. If further imaging evaluation is desired, liver protocol abdominal MRI should be considered. Splenomegaly. Apparent thickening of the gallbladder wall, without evidence of cholelithiasis, and with negative sonographic Murphy's sign. The findings are favored to represent gallbladder wall edema secondary to systemic causes such as hyperproteinemia rather than acute cholecystitis. Please correlate clinically. Electronically Signed   By: Fidela Salisbury M.D.   On: 10/23/2017 20:58    EKG: Independently reviewed.  Assessment/Plan Principal Problem:   Obstructive jaundice Active Problems:   Hypertension   Diabetes mellitus (HCC)   Hx of substance abuse- clean several years now   Alcoholic cirrhosis of liver without ascites (HCC)   Transaminitis   Liver lesion   History of colon cancer    1. Obstructive jaundice, transaminitis, liver lesion on [place] - h/o EtOH cirrhosis stage 1 1. EDP spoke with Dr. Havery Moros: 1. AFP 2. Hepatitis pnl 3. MRCP 4. Put on ABx if he spikes fever overnight 5. Will see in AM 2. Will hold on the empiric PO vanc that PCP started him on 3. Clear liquid diet 4. Repeat CBC/CMP in AM 2. HTN - 1. Continue lisinopril 3. DM2 - 1. Hold PO hypoglycemics 2. levemir at 66% home dose (so 41u in AM and 6u PM) 3. Sensitive scale SSI q4h  DVT prophylaxis:  Lovenox Code Status: Full - confirmed with patient despite prior DNR during a previous admit Family Communication: No family in room Disposition Plan: Home Consults called: Dr. Havery Moros Admission status: Admit to inpatient  Severity of Illness: The appropriate patient status for this patient is INPATIENT. Inpatient status is judged to be reasonable and necessary in order to provide the required intensity of service to ensure the patient's safety. The patient's presenting symptoms, physical exam findings, and initial radiographic and laboratory data in the context of their chronic comorbidities is felt to place them at high risk for further clinical deterioration. Furthermore, it is not anticipated that the patient will be medically stable for discharge from the hospital within 2 midnights of admission. The following factors support the patient status of inpatient.   " The patient's presenting symptoms include Jaundice, weight loss. " The worrisome physical exam findings include Jaundice. " The initial radiographic and laboratory data are worrisome because of Transaminitis, hyperbilirubinemia, alk of 700, Liver lesion on [place]. " The chronic co-morbidities include DM2, HTN.   * I certify that at the point of admission it is my clinical judgment that the patient will require inpatient hospital care spanning beyond 2 midnights from the point of admission due to high intensity of service, high risk for further deterioration and high frequency of surveillance required.Etta Quill DO [facility] Pager [phone] Only works nights!  If 7AM-7PM, please contact the primary day team physician taking care of patient  [email or web address] Password [email or web address]  10/23/2017, 11:00 PM

## 2017-10-23 NOTE — ED Triage Notes (Signed)
Patient reports that he went to PCP yesterday and was told today to go to the ED  Because labs indicated liver failure. Patient is jaundice and states he has had gray diarrhea x 2 weeks.

## 2017-10-24 DIAGNOSIS — R945 Abnormal results of liver function studies: Secondary | ICD-10-CM

## 2017-10-24 LAB — COMPREHENSIVE METABOLIC PANEL
ALT: 187 U/L — ABNORMAL HIGH (ref 0–44)
AST: 204 U/L — AB (ref 15–41)
Albumin: 2.5 g/dL — ABNORMAL LOW (ref 3.5–5.0)
Alkaline Phosphatase: 682 U/L — ABNORMAL HIGH (ref 38–126)
Anion gap: 8 (ref 5–15)
BILIRUBIN TOTAL: 17.2 mg/dL — AB (ref 0.3–1.2)
BUN: 10 mg/dL (ref 8–23)
CHLORIDE: 99 mmol/L (ref 98–111)
CO2: 23 mmol/L (ref 22–32)
CREATININE: 0.3 mg/dL — AB (ref 0.61–1.24)
Calcium: 8.7 mg/dL — ABNORMAL LOW (ref 8.9–10.3)
GFR calc non Af Amer: >60 mL/min (ref >60)
Glucose, Bld: 112 mg/dL — ABNORMAL HIGH (ref 70–99)
POTASSIUM: 4.2 mmol/L (ref 3.5–5.1)
Sodium: 130 mmol/L — ABNORMAL LOW (ref 135–145)
TOTAL PROTEIN: 7.3 g/dL (ref 6.5–8.1)

## 2017-10-24 LAB — CBC
HCT: 35.7 % — ABNORMAL LOW (ref 39.0–52.0)
HEMOGLOBIN: 12 g/dL — AB (ref 13.0–17.0)
MCH: 31 pg (ref 26.0–34.0)
MCHC: 33.6 g/dL (ref 30.0–36.0)
MCV: 92.2 fL (ref 80.0–100.0)
NRBC: 0 % (ref 0.0–0.2)
Platelets: 204 10*3/uL (ref 150–400)
RBC: 3.87 MIL/uL — ABNORMAL LOW (ref 4.22–5.81)
RDW: 16.8 % — AB (ref 11.5–15.5)
WBC: 7.7 10*3/uL (ref 4.0–10.5)

## 2017-10-24 LAB — GLUCOSE, CAPILLARY
GLUCOSE-CAPILLARY: 103 mg/dL — AB (ref 70–99)
GLUCOSE-CAPILLARY: 117 mg/dL — AB (ref 70–99)
GLUCOSE-CAPILLARY: 94 mg/dL (ref 70–99)
GLUCOSE-CAPILLARY: 95 mg/dL (ref 70–99)
Glucose-Capillary: 95 mg/dL (ref 70–99)
Glucose-Capillary: 128 mg/dL — ABNORMAL HIGH (ref 70–99)

## 2017-10-24 LAB — HIV ANTIBODY (ROUTINE TESTING W REFLEX): HIV SCREEN 4TH GENERATION: NONREACTIVE

## 2017-10-24 MED ORDER — GABAPENTIN 400 MG PO CAPS
400.0000 mg | ORAL_CAPSULE | Freq: Every day | ORAL | Status: DC
  Administered 2017-10-24 – 2017-10-29 (×5): 400 mg via ORAL
  Filled 2017-10-24 (×5): qty 1

## 2017-10-24 MED ORDER — LACTULOSE 10 GM/15ML PO SOLN
10.0000 g | Freq: Two times a day (BID) | ORAL | Status: DC
  Administered 2017-10-24 – 2017-10-28 (×9): 10 g via ORAL
  Filled 2017-10-24 (×9): qty 15

## 2017-10-24 MED ORDER — ENOXAPARIN SODIUM 40 MG/0.4ML ~~LOC~~ SOLN: 40.0000 mg | Freq: Every day | SUBCUTANEOUS | Status: DC

## 2017-10-24 MED ORDER — UNJURY CHICKEN SOUP POWDER
2.0000 [oz_av] | Freq: Three times a day (TID) | ORAL | Status: DC
  Administered 2017-10-24: 2 [oz_av] via ORAL
  Filled 2017-10-24 (×6): qty 27

## 2017-10-24 MED ORDER — BOOST / RESOURCE BREEZE PO LIQD CUSTOM
1.0000 | Freq: Three times a day (TID) | ORAL | Status: DC
  Administered 2017-10-24 (×2): 1 via ORAL

## 2017-10-24 NOTE — Progress Notes (Signed)
Initial Nutrition Assessment  DOCUMENTATION CODES:   Non-severe (moderate) malnutrition in context of chronic illness  INTERVENTION:  Continue Boost Breeze TID: to provide 250kcal, 9g protein per serving Continue MVI Unjury; Chx flavor BID: to provide 100kcal, 21g protein per serving Monitor for diet advancement    NUTRITION DIAGNOSIS:   Moderate Malnutrition related to chronic illness, nausea, diarrhea, vomiting(cirrhosis stage1) as evidenced by moderate muscle depletion, percent weight loss, moderate fat depletion, energy intake < or equal to 75% for > or equal to 1 month.   GOAL:   Patient will meet greater than or equal to 90% of their needs  MONITOR:   Diet advancement, Supplement acceptance, PO intake, Labs  REASON FOR ASSESSMENT:   Malnutrition Screening Tool    ASSESSMENT:  62 yo ED to hospital admit after outpt labs performed by PCP showed elevated liver enzymes. Pt experienced n/v/d x 2 weeks and reported 20lb wt. Loss per MD note. Moderate malnutrition identified 02/19. PMH: EtOH cirrhosis stage 1, substance abuse, PTSD, colon cancer, T2DM, HTN, Hemicolectomy, anal fistulectomy, coronary angioplasty, irrigation/debridement of perirectal abscess  Pt lying on side and appears jaundice, nurse in room at time of visit. After introducing myself, pt inquired about my visit stating I don't know why you are here because the doctor wont let me eat and reports starving. RD asked pt if he had tried the [employer] [facility] and he stated that was not considered food, but had consumed 50% of one sitting on bedside tray.   Pt stated that he really enjoyed cooking at home and would like to have his homemade chicken noodle soup. Pt endorses 3 meals/day when feeling good. Breakfast is usually a bowl of instant oatmeal, lunch consists of either a sandwich, or black beans and rice, tacos, salad. Dinner pt reports a variety including soups, boiled salmon, grilled chx and reports consuming very  little red meat.   Pt confirms feeling poorly for 2 months and approximate 20lb wt loss in 2 weeks d/t n/v/d episodes. Pt reports 1 loose stool yesterday and denies n/v. Pt recalls UBW of 235lbs  Pt open to continued BB and Unjury chx soup while on CL.   Medications: Gabapentin, Levemir(41units) daily, (6units) at bedtime, lactulose, MVI, Zofran  Labs: Na 130 (L) -replacing Ca corrects 9.9 for low albumin Alkaline Phos: 682 (H) AST: 204 (H) ALT: 187 (H) Total Bilirubin 17.2 (H) Ammonia 47 (H)  NUTRITION - FOCUSED PHYSICAL EXAM:    Most Recent Value  Orbital Region  Moderate depletion  Upper Arm Region  Mild depletion  Thoracic and Lumbar Region  Mild depletion  Buccal Region  Moderate depletion  Temple Region  Moderate depletion  Clavicle Bone Region  Mild depletion  Clavicle and Acromion Bone Region  Unable to assess [pt dressed in long sleeve from home]  Scapular Bone Region  Unable to assess  Dorsal Hand  Mild depletion  Patellar Region  Mild depletion  Anterior Thigh Region  Unable to assess [pt dressed in sweatpants from home]  Posterior Calf Region  Moderate depletion  Edema (RD Assessment)  None  Hair  Reviewed  Eyes  Reviewed [jaundice]  Mouth  Reviewed  Skin  Reviewed  Nails  Reviewed       Diet Order:   Diet Order            Diet clear liquid Room service appropriate? Yes; Fluid consistency: Thin  Diet effective now              EDUCATION  NEEDS:   No education needs have been identified at this time  Skin:  Skin Assessment: Reviewed RN Assessment(dry; juandice)  Last BM:  10/17; Diarrhea per pt  Height:   Ht Readings from Last 1 Encounters:  10/23/17 5' 10.5" (1.791 m)    Weight: 226.6 lbs  Wt Readings from Last 1 Encounters:  10/23/17 103 kg   09/05/17 105.8 kg  04/07/17 110.7 kg  04/02/17 110.9 kg  02/24/17 111.1 kg   Percent Recent Weight Change: 3.57% in 2 weeks  Ideal Body Weight:  76.8 kg (169lb) Usual Body Weight: 106.8 kg  (235lb)  BMI:  Body mass index is 32.11 kg/m.  Estimated Nutritional Needs:   Kcal:  2200-2390  Protein:  120-138  Fluid:  2.2-2.3L    Lajuan Lines, RD, LDN  After Hours/Weekend Pager: [phone]

## 2017-10-24 NOTE — Consult Note (Addendum)
Consultation  Referring Provider: Dr. Patrecia Pour    Primary Care Physician:  [name], MD Primary Gastroenterologist: Dr. Silverio Decamp       Reason for Consultation: Obstructive jaundice, elevated LFTs, history of cirrhosis         HPI:   Matthew Lewis is a 62 y.o. male with a past medical history significant for diabetes type 2, hypertension, prior alcohol abuse, colon cancer 13 years ago treated surgically and alcoholic cirrhosis stage I, who presented to the ER on 10/23/2017 with jaundice.    Today, patient is found laying in bed with his wife by his side he does assist with history, explains that he saw his primary care provider and had some abnormal labs on Wednesday, 10/22/2017 and was told to present to the ER.  He had been feeling unwell since August of this year with generalized body aches, a new rash and malaise.  Describes 2 weeks of nausea and diarrhea for which his PCP started him on empiric Vancomycin just yesterday for suspected "bacterial infection" (labs are unavailable I am unsure if this was C. difficile or not).  Patient explains that just earlier this week he was told by 2 of his friends that he looked yellow which is why he went to his PCPs office.  Associated symptoms included decrease in appetite, generalized abdominal pain as well as nausea and a 20 pound weight loss over the past 2 months.     Continues to tell me that he is abstaining from alcohol but does ask today if this is worsening of his cirrhosis.    Denies fever, chills, heartburn, reflux, increasing abdominal distention or peripheral edema, dizziness or syncope.  ED Course: Tbili 16.9, AST 225, ALT220, ALK 778  GI History: 04/02/2017 office visit, Dr. Silverio Decamp: Noted to have alcohol abstinence since 8588, nonalcoholic steatohepatitis progressed to cirrhosis June 2018-EGD: Small/trace esophageal varices, small clean-based duodenal ulcers and severe erosive gastritis likely secondary to NSAIDs, H.  pylori negative January 2015 colonoscopy: With removal of one diminutive tubular adenoma  Past Medical History:  Diagnosis Date  . Abscess of anal and rectal regions    horse shoe abscess  . Alcoholic cirrhosis ([place])   . Arthritis    "everywhere; especially in my spine"  . Cirrhosis ([place])    STAGE 1 CIRRHOSIS PATIENT SEES DR NAMDIGAM FOR  . Colon cancer ([place]) 05/2005   TX SURGERY WITH LYMPH NODE REMOVAL  . Cough    LAST FEW WEEKS SAW DR RAMOS LUNGS MILKY SPUTUM OCC  . DM type 2 with diabetic peripheral neuropathy ([facility])    left foot  . Fatigue   . Fistula, anal    multiple  . History of substance abuse ([place])    last alcohol was 08/2011; last marijuania was 08/2011  . Hypertension   . PTSD (post-traumatic stress disorder)    "severe" HX OF  . Sleep deprivation     Past Surgical History:  Procedure Laterality Date  . ABSCESS DRAINAGE     "probably 15-20 so far at least; rectal"  . ANAL FISTULECTOMY  12/13/1998  . ANTERIOR FUSION CERVICAL SPINE  2010   "triple"; "did 2 surgeries on the same day in 5027 due to complications"  . CORONARY ANGIOPLASTY  2001  . detached muscle  2010   right chest; "after cervical fusion complications"  . ELBOW SURGERY  2007   "cut out part of a muscle"; left  . EXAMINATION UNDER ANESTHESIA  03/22/2005   fistula  .  EXAMINATION UNDER ANESTHESIA  06/06/2011   Procedure: EXAM UNDER ANESTHESIA;  Surgeon: Stark Klein, MD;  Location: [facility];  Service: General;  Laterality: N/A;  . HEMICOLECTOMY  06/06/2005   left  . INCISION AND DRAINAGE PERIRECTAL ABSCESS  03/30/2005  . INCISION AND DRAINAGE PERIRECTAL ABSCESS  12/13/2004  . INCISION AND DRAINAGE PERIRECTAL ABSCESS  07/14/2001  . INCISION AND DRAINAGE PERIRECTAL ABSCESS  08/12/2010   horseshoe abscess; Dr Redmond Pulling  . INCISION AND DRAINAGE PERIRECTAL ABSCESS  06/06/2011   Procedure: IRRIGATION AND DEBRIDEMENT PERIRECTAL ABSCESS;  Surgeon: Stark Klein, MD;  Location: [facility];  Service: General;  Laterality: N/A;    . INCISION AND DRAINAGE PERIRECTAL ABSCESS N/A 02/24/2017   Procedure: IRRIGATION AND DEBRIDEMENT PERIRECTAL ABSCESS;  Surgeon: Ralene Ok, MD;  Location: [facility];  Service: General;  Laterality: N/A;  . LUMBAR [place]  . PERCUTANEOUS PINNING PHALANX FRACTURE OF HAND  ~ 2008   "plates in 2 places"; right  . PLACEMENT OF SETON N/A 09/05/2017   Procedure: PLACEMENT OF SETON;  Surgeon: Leighton Ruff, MD;  Location: [facility];  Service: General;  Laterality: N/A;  . THORACIC DISCECTOMY  1990's    Family History  Problem Relation Age of Onset  . Heart disease Father   . Heart disease Mother   . Cancer Mother   . Cancer Sister   . Heart disease Brother      Social History   Tobacco Use  . Smoking status: Former Smoker    Packs/day: 1.00    Years: 10.00    Pack years: 10.00    Types: Cigarettes    Last attempt to quit: 01/08/1991    Years since quitting: 26.8  . Smokeless tobacco: Never Used  Substance Use Topics  . Alcohol use: Yes    Comment: 06/05/11 "drank enough when I was younger to last me my whole life; don't remember when I had my last drink, maybe 2012"  . Drug use: Not Currently    Types: Oxycodone, Marijuana    Comment: 06/05/11 "pot head in my younger days; 2009 dr had me on oxycodone and valium for almost 63yr    Prior to Admission medications   Medication Sig Start Date End Date Taking? Authorizing Provider  aspirin EC 81 MG tablet Take 81 mg by mouth daily.   Yes [provider]  atorvastatin (LIPITOR) 40 MG tablet Take 1 tablet (40 mg total) by mouth daily at 6 PM. Patient taking differently: Take 20 mg by mouth daily at 6 PM.  12/02/12  Yes Viyuoh, Adeline C, MD  calcium carbonate (TUMS - DOSED IN MG ELEMENTAL CALCIUM) 500 MG chewable tablet Chew 1 tablet by mouth 2 (two) times daily as needed for indigestion or heartburn.   Yes [provider]  celecoxib (CELEBREX) 200 MG capsule Take 1 capsule by mouth daily.  10/10/17  Yes [provider]  empagliflozin (JARDIANCE) 10 MG TABS tablet Take 10 mg by mouth at bedtime.    Yes [provider]  Fexofenadine-Pseudoephedrine (ALLEGRA-D 24 HOUR PO) Take by mouth daily. WAL/FEX   Yes [provider]  gabapentin (NEURONTIN) 400 MG capsule Take 400-800 mg by mouth 2 (two) times daily. 1 capsule am, 2 capsules pm   Yes [provider]  glipiZIDE (GLUCOTROL) 5 MG tablet Take 5 mg by mouth 2 (two) times daily before a meal.   Yes [provider]  Insulin Detemir (LEVEMIR FLEXPEN) 100 UNIT/ML Pen Inject 10-60 Units into the  skin 2 (two) times daily. Take 62 units in the am and Take 10 units in the pm   Yes [provider]  lisinopril (PRINIVIL,ZESTRIL) 20 MG tablet Take 20 mg by mouth daily.   Yes [provider]  metFORMIN (GLUCOPHAGE) 1000 MG tablet Take 1,000 mg by mouth 2 (two) times daily with a meal.   Yes [provider]  Multiple Vitamin (MULTIVITAMIN) tablet Take 1 tablet by mouth daily.   Yes [provider]  polyvinyl alcohol (LIQUIFILM TEARS) 1.4 % ophthalmic solution 1 drop as needed for dry eyes.   Yes [provider]  vancomycin (VANCOCIN) 125 MG capsule Take 1 capsule by mouth every 6 (six) hours. 10/23/17  Yes [provider]    Current Facility-Administered Medications  Medication Dose Route Frequency Provider Last Rate Last Dose  . calcium carbonate (TUMS - dosed in mg elemental calcium) chewable tablet 200 mg of elemental calcium  1 tablet Oral BID PRN Etta Quill, DO      . enoxaparin (LOVENOX) injection 40 mg  40 mg Subcutaneous Daily Alcario Drought, Jared M, DO      . feeding supplement (BOOST / RESOURCE BREEZE) liquid 1 Container  1 Container Oral TID BM Etta Quill, DO      . gabapentin (NEURONTIN) capsule 400 mg  400 mg Oral Daily Jennette Kettle M, DO      . gabapentin (NEURONTIN) capsule 800 mg  800 mg Oral QHS Jennette Kettle M, DO   800 mg at  10/24/17 0017  . insulin aspart (novoLOG) injection 0-9 Units  0-9 Units Subcutaneous Q4H Jennette Kettle M, DO      . insulin detemir (LEVEMIR) injection 41 Units  41 Units Subcutaneous Daily Alcario Drought, Jared M, DO      . insulin detemir (LEVEMIR) injection 6 Units  6 Units Subcutaneous QHS Etta Quill, DO   6 Units at 10/24/17 0047  . lactulose (CHRONULAC) 10 GM/15ML solution 10 g  10 g Oral BID Patrecia Pour, Christean Grief, MD      . lisinopril (PRINIVIL,ZESTRIL) tablet 20 mg  20 mg Oral Daily Alcario Drought, Jared M, DO      . multivitamin with minerals tablet 1 tablet  1 tablet Oral Daily Alcario Drought, Jared M, DO      . ondansetron [facility]) tablet 4 mg  4 mg Oral Q6H PRN Etta Quill, DO       Or  . ondansetron [facility]) injection 4 mg  4 mg Intravenous Q6H PRN Etta Quill, DO   4 mg at 10/24/17 0207  . polyvinyl alcohol (LIQUIFILM TEARS) 1.4 % ophthalmic solution 1 drop  1 drop Both Eyes PRN Etta Quill, DO        Allergies as of 10/23/2017 - Review Complete 10/23/2017  Allergen Reaction Noted  . Other Other (See Comments) 05/15/2012     Review of Systems:    Constitutional: No weight loss, fever or chills Skin: +rash Cardiovascular: No chest pain Respiratory: No SOB  Gastrointestinal: See HPI and otherwise negative Genitourinary: No dysuria  Neurological: No headache Musculoskeletal: No new muscle or joint pain Hematologic: No bleeding or bruising Psychiatric: No history of depression or anxiety    Physical Exam:  Vital signs in last 24 hours: Temp:  [97.8 F (36.6 C)-98.1 F (36.7 C)] 98.1 F (36.7 C) (10/18 0004) Pulse Rate:  [91-106] 91 (10/18 0900) Resp:  [15-18] 17 (10/18 0900) BP: (109-131)/(67-81) 117/79 (10/18 0004) SpO2:  [96 %-99 %] 99 % (10/18  0004) Weight:  [103 kg] 103 kg (10/17 1707) Last BM Date: (277824) General:   Pleasant Jaundiced, Caucasian male appears to be in NAD, Well developed, Well nourished, alert and cooperative Head:  Normocephalic and  atraumatic. Eyes:   PEERL, EOMI. +icterus, Conjunctiva pink. Ears:  Normal auditory acuity. Neck:  Supple Throat: Oral cavity and pharynx without inflammation, swelling or lesion.  Lungs: Respirations even and unlabored. Lungs clear to auscultation bilaterally.   No wheezes, crackles, or rhonchi.  Heart: Normal S1, S2. No MRG. Regular rate and rhythm. No peripheral edema, cyanosis or pallor.  Abdomen:  Soft, nondistended, marked ttp RUQ. No rebound or guarding. Normal bowel sounds. No appreciable masses or hepatomegaly. Rectal:  Not performed.  Msk:  Symmetrical without gross deformities. Peripheral pulses intact.  Extremities:  Without edema, no deformity or joint abnormality. Normal ROM, normal sensation. Neurologic:  Alert and  oriented x4;  grossly normal neurologically. Skin:   Dry and intact without significant lesions or rashes. Psychiatric: Demonstrates good judgement and reason without abnormal affect or behaviors.   LAB RESULTS: Recent Labs    10/23/17 1746 10/24/17 0549  WBC 8.5 7.7  HGB 13.0 12.0*  HCT 39.5 35.7*  PLT 273 204   BMET Recent Labs    10/23/17 1746 10/24/17 0549  NA 134* 130*  K 4.2 4.2  CL 100 99  CO2 22 23  GLUCOSE 120* 112*  BUN 13 10  CREATININE 0.55* 0.30*  CALCIUM 9.6 8.7*   Hepatic Function Latest Ref Rng & Units 10/24/2017 10/23/2017 04/02/2017  Total Protein 6.5 - 8.1 g/dL 7.3 8.2(H) 7.8  Albumin 3.5 - 5.0 g/dL 2.5(L) 3.0(L) 4.2  AST 15 - 41 U/L 204(H) 225(H) 29  ALT 0 - 44 U/L 187(H) 220(H) 39  Alk Phosphatase 38 - 126 U/L 682(H) 778(H) 71  Total Bilirubin 0.3 - 1.2 mg/dL 17.2(H) 16.9(H) 0.5   PT/INR Recent Labs    10/23/17 1918  LABPROT 13.8  INR 1.07    STUDIES: US Abdomen Complete  Result Date: 10/23/2017 CLINICAL DATA:  Jaundiced. EXAM: ABDOMEN ULTRASOUND COMPLETE COMPARISON:  Body CT 02/24/2017 FINDINGS: Gallbladder: No gallstones seen. There is gallbladder wall thickening measuring 6 mm. No sonographic Murphy sign  noted by sonographer. Common bile duct: Diameter: 6.3 mm Liver: Coarse nodular echogenicity of the liver. Area of heterogeneity or an ill-defined mass in segment 6 of the liver measures 2.6 x 2.4 x 2.2 cm. Portal vein is patent on color Doppler imaging with normal direction of blood flow towards the liver. IVC: No abnormality visualized. Pancreas: Visualized portion unremarkable. Spleen: Splenomegaly. Splenic length is 16.2 cm with volume of 994 cubic cm. Right Kidney: Length: 14.8 cm. Echogenicity within normal limits. No mass or hydronephrosis visualized. Left Kidney: Length: 14.7 cm. Echogenicity within normal limits. No mass or hydronephrosis visualized. Abdominal aorta: No aneurysm visualized. Other findings: None. IMPRESSION: Cirrhotic appearance of the liver. 2.6 cm questionable ill-defined mass versus area of focal parenchymal heterogeneity in segment 6 of the liver. If further imaging evaluation is desired, liver protocol abdominal MRI should be considered. Splenomegaly. Apparent thickening of the gallbladder wall, without evidence of cholelithiasis, and with negative sonographic Murphy's sign. The findings are favored to represent gallbladder wall edema secondary to systemic causes such as hyperproteinemia rather than acute cholecystitis. Please correlate clinically. Electronically Signed   By: Fidela Salisbury M.D.   On: 10/23/2017 20:58    Impression / Plan:   Impression: 1.  Jaundice: Question of possible obstructive jaundice with a new  2.6 cm questionable ill-defined mass versus area of focal parenchymal heterogenicity in segment 6 of the liver; consider [place] versus other 2.  History of nonalcoholic steatohepatitis with progression to cirrhosis: Previous work-up for underlying chronic liver disease and viral hepatitis negative 3.  History of alcohol abuse 4.  Obesity 5.  History of colon cancer and adenomatous colon polyps 6.  Diarrhea: Questionable diagnosis of C. difficile as an  outpatient for which the patient was on vancomycin as of 10/22/2017  Plan: 1.  Plan for open air MRI today with anesthesia as patient is only comfortable this way.  He will be transferred to [facility] for this imaging today.  Pending results we will consider ERCP versus possible IR intervention 2.  Patient will be due for repeat surveillance colonoscopy in January 2020 3.  Continue supportive measures with monitoring of LFTs 4.  Patient may be on clear liquid diet today after MRI. 5.  Reordered C. difficile PCR for confirmation of this diagnosis 6.  Please await any further recommendations from Dr. Hilarie Fredrickson later today  Thank you for your kind consultation, we will continue to follow.  Lavone Nian Kelcey Korus  10/24/2017, 10:08 AM   Addendum: 10/24/17 1451 MRI with anesthesia is scheduled tomorrow at [facility] at 8:00 am. Patient will be picked up by [facility] at 7:15 am. Patient aware, Hospitalist aware.  Ellouise Newer, PA-C

## 2017-10-24 NOTE — Progress Notes (Signed)
PROGRESS NOTE [facility]   Matthew Lewis   YQM:[id number] DOB: 1955/02/02  DOA: 10/23/2017 PCP: [name], MD   Brief Narrative:  Matthew Lewis is a 62 year old male with medical history significant for diabetes mellitus type 2, hypertension, alcoholic cirrhosis and history of colon cancer in remission.  Patient presented to the emergency department after being sent from primary care physician for abnormal LFTs.  Patient has been having diarrhea for 2 weeks prior to admission with weight loss and jaundice.  Patient was seen by PCP and started on empiric vancomycin for C. difficile and lab work-up revealed elevated liver enzymes with severely elevated T bili.  Patient admitted with working diagnosis of jaundice. GI was consulted.   Subjective: Patient seen and examined, complaining of right upper quadrant pain.  Denies nausea, vomiting and diarrhea.  Assessment & Plan: Jaundice/transaminitis   ? Obstructive, liver mass on [place], hx of EtOH cirrhosis stage 1.  Hepatitis panel and AFP pending.  GI consulted and recommending MRCP.  Patient severely claustrophobic unable to tolerate MRI with the anxiety medications. MRI under anesthesia on 10/19 at [facility].  T bili trending up, INR normal. No signs of infectious process at this time.  Monitor LFTs and trend bili.  Soft diet and n.p.o. After midnight.   Hx of cirrhosis  [place] consistent with cirrhotic pattern, ammonia elevated, started on lactulose BID. Management per GI   HTN  BP stable continue current regimen   DM type 2  CBGs stable, continue levamir at reduced dose, cont SSI, check A1C    Diarrhea  Prior to admission was treated with empiric oral vanc for presumed C-diff. Vancomycin has been stopped, patient currently denies diarrhea, if recurs will get stool sample. Continue to monitor      DVT prophylaxis: Lovenox Code Status: Full code Family Communication: Wife at bedside Disposition Plan: Pending on GI  work-up  Consultants:   GI  Procedures:   None   Antimicrobials:  None     Objective: Vitals:   10/23/17 2053 10/24/17 0004 10/24/17 0900 10/24/17 1400  BP: 109/67 117/79  128/76  Pulse: 98 92 91 94  Resp: 16 18 17 17   Temp:  98.1 F (36.7 C)  99.6 F (37.6 C)  TempSrc:    Oral  SpO2: 96% 99%  99%  Weight:      Height:        Intake/Output Summary (Last 24 hours) at 10/24/2017 1453 Last data filed at 10/24/2017 1100 Gross per 24 hour  Intake 240 ml  Output 1050 ml  Net -810 ml   Filed Weights   10/23/17 1707  Weight: 103 kg    Examination:  General exam: Appears calm and comfortable  HEENT: Scleral icterus Respiratory system: Clear to auscultation. No wheezes,crackle or rhonchi Cardiovascular system: S1 & S2 heard, RRR. No JVD, murmurs, rubs or gallops Gastrointestinal system: Abdomen soft, mild diffuse tenderness nondistended.. No organomegaly or masses felt. Normal bowel sounds heard. Central nervous system: Alert and oriented. No focal neurological deficits. Extremities: No pedal edema.  Psychiatry: . Mood & affect appropriate.    Data Reviewed: I have personally reviewed following labs and imaging studies  CBC: Recent Labs  Lab 10/23/17 1746 10/24/17 0549  WBC 8.5 7.7  NEUTROABS 6.4  --   HGB 13.0 12.0*  HCT 39.5 35.7*  MCV 91.6 92.2  PLT 273 528   Basic Metabolic Panel: Recent Labs  Lab 10/23/17 1746 10/24/17 0549  NA 134* 130*  K 4.2  4.2  CL 100 99  CO2 22 23  GLUCOSE 120* 112*  BUN 13 10  CREATININE 0.55* 0.30*  CALCIUM 9.6 8.7*   GFR: Estimated Creatinine Clearance: 113.1 mL/min (A) (by C-G formula based on SCr of 0.3 mg/dL (L)). Liver Function Tests: Recent Labs  Lab 10/23/17 1746 10/24/17 0549  AST 225* 204*  ALT 220* 187*  ALKPHOS 778* 682*  BILITOT 16.9* 17.2*  PROT 8.2* 7.3  ALBUMIN 3.0* 2.5*   Recent Labs  Lab 10/23/17 1746  LIPASE 27   Recent Labs  Lab 10/23/17 1918  AMMONIA 47*   Coagulation  Profile: Recent Labs  Lab 10/23/17 1918  INR 1.07   Cardiac Enzymes: No results for input(s): CKTOTAL, CKMB, CKMBINDEX, TROPONINI in the last 168 hours. BNP (last 3 results) No results for input(s): PROBNP in the last 8760 hours. HbA1C: No results for input(s): HGBA1C in the last 72 hours. CBG: Recent Labs  Lab 10/24/17 0011 10/24/17 0411 10/24/17 0739 10/24/17 1140  GLUCAP 95 103* 94 95   Lipid Profile: No results for input(s): CHOL, HDL, LDLCALC, TRIG, CHOLHDL, LDLDIRECT in the last 72 hours. Thyroid Function Tests: No results for input(s): TSH, T4TOTAL, FREET4, T3FREE, THYROIDAB in the last 72 hours. Anemia Panel: No results for input(s): VITAMINB12, FOLATE, FERRITIN, TIBC, IRON, RETICCTPCT in the last 72 hours. Sepsis Labs: No results for input(s): PROCALCITON, LATICACIDVEN in the last 168 hours.  No results found for this or any previous visit (from the past 240 hour(s)).    Radiology Studies: US Abdomen Complete  Result Date: 10/23/2017 CLINICAL DATA:  Jaundiced. EXAM: ABDOMEN ULTRASOUND COMPLETE COMPARISON:  Body CT 02/24/2017 FINDINGS: Gallbladder: No gallstones seen. There is gallbladder wall thickening measuring 6 mm. No sonographic Murphy sign noted by sonographer. Common bile duct: Diameter: 6.3 mm Liver: Coarse nodular echogenicity of the liver. Area of heterogeneity or an ill-defined mass in segment 6 of the liver measures 2.6 x 2.4 x 2.2 cm. Portal vein is patent on color Doppler imaging with normal direction of blood flow towards the liver. IVC: No abnormality visualized. Pancreas: Visualized portion unremarkable. Spleen: Splenomegaly. Splenic length is 16.2 cm with volume of 994 cubic cm. Right Kidney: Length: 14.8 cm. Echogenicity within normal limits. No mass or hydronephrosis visualized. Left Kidney: Length: 14.7 cm. Echogenicity within normal limits. No mass or hydronephrosis visualized. Abdominal aorta: No aneurysm visualized. Other findings: None.  IMPRESSION: Cirrhotic appearance of the liver. 2.6 cm questionable ill-defined mass versus area of focal parenchymal heterogeneity in segment 6 of the liver. If further imaging evaluation is desired, liver protocol abdominal MRI should be considered. Splenomegaly. Apparent thickening of the gallbladder wall, without evidence of cholelithiasis, and with negative sonographic Murphy's sign. The findings are favored to represent gallbladder wall edema secondary to systemic causes such as hyperproteinemia rather than acute cholecystitis. Please correlate clinically. Electronically Signed   By: Fidela Salisbury M.D.   On: 10/23/2017 20:58      Scheduled Meds: . [START ON 10/26/2017] enoxaparin (LOVENOX) injection  40 mg Subcutaneous Daily  . feeding supplement  1 Container Oral TID BM  . gabapentin  400 mg Oral Daily  . gabapentin  800 mg Oral QHS  . insulin aspart  0-9 Units Subcutaneous Q4H  . insulin detemir  41 Units Subcutaneous Daily  . insulin detemir  6 Units Subcutaneous QHS  . lactulose  10 g Oral BID  . lisinopril  20 mg Oral Daily  . multivitamin with minerals  1 tablet Oral Daily  .  protein supplement  2 oz Oral TID   Continuous Infusions:   LOS: 1 day    Time spent: Total of 35 minutes spent with pt, greater than 50% of which was spent in discussion of  treatment, counseling and coordination of care   Chipper Oman, MD Pager: Text Page via [email or web address]   If 7PM-7AM, please contact night-coverage [email or web address] 10/24/2017, 2:53 PM   Note - This record has been created using [employer]. Chart creation errors have been sought, but may not always have been located. Such creation errors do not reflect on the standard of medical care.

## 2017-10-25 ENCOUNTER — Encounter (HOSPITAL_COMMUNITY): Payer: Self-pay | Admitting: Anesthesiology

## 2017-10-25 ENCOUNTER — Inpatient Hospital Stay (HOSPITAL_COMMUNITY): Payer: Medicare Other | Admitting: Anesthesiology

## 2017-10-25 ENCOUNTER — Ambulatory Visit (HOSPITAL_COMMUNITY)
Admit: 2017-10-25 | Discharge: 2017-10-25 | Disposition: A | Discharge status: Home / Self Care | Payer: Medicare Other | Attending: Family Medicine | Admitting: Family Medicine

## 2017-10-25 ENCOUNTER — Encounter (HOSPITAL_COMMUNITY)
Admission: EM | Disposition: A | Discharge status: Home / Self Care | Payer: Self-pay | Source: Home / Self Care | Attending: Family Medicine

## 2017-10-25 DIAGNOSIS — C220 Liver cell carcinoma: Secondary | ICD-10-CM

## 2017-10-25 DIAGNOSIS — C7971 Secondary malignant neoplasm of right adrenal gland: Secondary | ICD-10-CM | POA: Diagnosis not present

## 2017-10-25 DIAGNOSIS — K769 Liver disease, unspecified: Secondary | ICD-10-CM | POA: Diagnosis not present

## 2017-10-25 DIAGNOSIS — C801 Malignant (primary) neoplasm, unspecified: Secondary | ICD-10-CM | POA: Diagnosis not present

## 2017-10-25 DIAGNOSIS — R932 Abnormal findings on diagnostic imaging of liver and biliary tract: Secondary | ICD-10-CM | POA: Diagnosis not present

## 2017-10-25 DIAGNOSIS — C7972 Secondary malignant neoplasm of left adrenal gland: Secondary | ICD-10-CM | POA: Diagnosis not present

## 2017-10-25 HISTORY — PX: RADIOLOGY WITH ANESTHESIA: SHX6223

## 2017-10-25 LAB — AFP TUMOR MARKER: AFP, Serum, Tumor Marker: 1.3 ng/mL (ref 0.0–8.3)

## 2017-10-25 LAB — CBC
HEMATOCRIT: 36.9 % — AB (ref 39.0–52.0)
HEMOGLOBIN: 12.2 g/dL — AB (ref 13.0–17.0)
MCH: 30.3 pg (ref 26.0–34.0)
MCHC: 33.1 g/dL (ref 30.0–36.0)
MCV: 91.8 fL (ref 80.0–100.0)
Platelets: 225 10*3/uL (ref 150–400)
RBC: 4.02 MIL/uL — AB (ref 4.22–5.81)
RDW: 17.2 % — ABNORMAL HIGH (ref 11.5–15.5)
WBC: 8.2 10*3/uL (ref 4.0–10.5)
nRBC: 0 % (ref 0.0–0.2)

## 2017-10-25 LAB — GLUCOSE, CAPILLARY
GLUCOSE-CAPILLARY: 127 mg/dL — AB (ref 70–99)
GLUCOSE-CAPILLARY: 110 mg/dL — AB (ref 70–99)
Glucose-Capillary: 118 mg/dL — ABNORMAL HIGH (ref 70–99)
Glucose-Capillary: 130 mg/dL — ABNORMAL HIGH (ref 70–99)
Glucose-Capillary: 130 mg/dL — ABNORMAL HIGH (ref 70–99)
Glucose-Capillary: 136 mg/dL — ABNORMAL HIGH (ref 70–99)
Glucose-Capillary: 227 mg/dL — ABNORMAL HIGH (ref 70–99)

## 2017-10-25 LAB — BASIC METABOLIC PANEL
ANION GAP: 8 (ref 5–15)
BUN: 9 mg/dL (ref 8–23)
CO2: 25 mmol/L (ref 22–32)
Calcium: 8.8 mg/dL — ABNORMAL LOW (ref 8.9–10.3)
Chloride: 96 mmol/L — ABNORMAL LOW (ref 98–111)
Creatinine, Ser: 0.38 mg/dL — ABNORMAL LOW (ref 0.61–1.24)
GFR calc non Af Amer: >60 mL/min (ref >60)
Glucose, Bld: 131 mg/dL — ABNORMAL HIGH (ref 70–99)
Potassium: 4.7 mmol/L (ref 3.5–5.1)
Sodium: 129 mmol/L — ABNORMAL LOW (ref 135–145)

## 2017-10-25 LAB — HEPATITIS PANEL, ACUTE
HCV Ab: <0.1 s/co ratio (ref 0.0–0.9)
HEP B S AG: NEGATIVE
Hep A IgM: NEGATIVE
Hep B C IgM: NEGATIVE

## 2017-10-25 LAB — AMMONIA: Ammonia: 32 umol/L (ref 9–35)

## 2017-10-25 LAB — HEPATIC FUNCTION PANEL
ALT: 197 U/L — ABNORMAL HIGH (ref 0–44)
AST: 214 U/L — ABNORMAL HIGH (ref 15–41)
Albumin: 2.4 g/dL — ABNORMAL LOW (ref 3.5–5.0)
Alkaline Phosphatase: 786 U/L — ABNORMAL HIGH (ref 38–126)
BILIRUBIN DIRECT: 13.4 mg/dL — AB (ref 0.0–0.2)
Indirect Bilirubin: 7 mg/dL — ABNORMAL HIGH (ref 0.3–0.9)
Total Bilirubin: 20.4 mg/dL (ref 0.3–1.2)
Total Protein: 7.4 g/dL (ref 6.5–8.1)

## 2017-10-25 LAB — PROTIME-INR
INR: 1.15
Prothrombin Time: 14.6 seconds (ref 11.4–15.2)

## 2017-10-25 LAB — HEPARIN LEVEL (UNFRACTIONATED): Heparin Unfractionated: <0.1 IU/mL — ABNORMAL LOW (ref 0.30–0.70)

## 2017-10-25 SURGERY — MRI WITH ANESTHESIA
Anesthesia: General

## 2017-10-25 MED ORDER — MIDAZOLAM HCL 2 MG/2ML IJ SOLN
INTRAMUSCULAR | Status: AC
  Filled 2017-10-25: qty 2

## 2017-10-25 MED ORDER — PROPOFOL 10 MG/ML IV BOLUS
INTRAVENOUS | Status: AC
  Filled 2017-10-25: qty 20

## 2017-10-25 MED ORDER — LACTATED RINGERS IV SOLN: INTRAVENOUS | Status: DC

## 2017-10-25 MED ORDER — FENTANYL CITRATE (PF) 250 MCG/5ML IJ SOLN
INTRAMUSCULAR | Status: AC
  Filled 2017-10-25: qty 5

## 2017-10-25 MED ORDER — HEPARIN BOLUS VIA INFUSION
2500.0000 [IU] | Freq: Once | INTRAVENOUS | Status: AC
  Administered 2017-10-25: 2500 [IU] via INTRAVENOUS
  Filled 2017-10-25: qty 2500

## 2017-10-25 MED ORDER — HEPARIN (PORCINE) IN NACL 100-0.45 UNIT/ML-% IJ SOLN
1450.0000 [IU]/h | INTRAMUSCULAR | Status: DC
  Administered 2017-10-25: 1450 [IU]/h via INTRAVENOUS
  Filled 2017-10-25 (×2): qty 250

## 2017-10-25 MED ORDER — ACETAMINOPHEN 500 MG PO TABS
1000.0000 mg | ORAL_TABLET | Freq: Four times a day (QID) | ORAL | Status: DC | PRN
  Filled 2017-10-25: qty 2

## 2017-10-25 MED ORDER — HEPARIN (PORCINE) IN NACL 100-0.45 UNIT/ML-% IJ SOLN
1600.0000 [IU]/h | INTRAMUSCULAR | Status: AC
  Filled 2017-10-25 (×2): qty 250

## 2017-10-25 MED ORDER — INSULIN ASPART 100 UNIT/ML ~~LOC~~ SOLN
0.0000 [IU] | Freq: Three times a day (TID) | SUBCUTANEOUS | Status: DC
  Administered 2017-10-25: 3 [IU] via SUBCUTANEOUS
  Administered 2017-10-26: 2 [IU] via SUBCUTANEOUS
  Administered 2017-10-26: 1 [IU] via SUBCUTANEOUS
  Administered 2017-10-26 – 2017-10-27 (×5): 2 [IU] via SUBCUTANEOUS
  Administered 2017-10-28 – 2017-10-29 (×5): 1 [IU] via SUBCUTANEOUS

## 2017-10-25 MED ORDER — ACETAMINOPHEN 500 MG PO TABS
1000.0000 mg | ORAL_TABLET | Freq: Three times a day (TID) | ORAL | Status: DC | PRN
  Administered 2017-10-25 – 2017-10-28 (×4): 1000 mg via ORAL
  Filled 2017-10-25 (×3): qty 2

## 2017-10-25 MED ORDER — GADOBUTROL 1 MMOL/ML IV SOLN
10.0000 mL | Freq: Once | INTRAVENOUS | Status: AC | PRN
  Administered 2017-10-25: 10 mL via INTRAVENOUS

## 2017-10-25 NOTE — Anesthesia Preprocedure Evaluation (Addendum)
Anesthesia Evaluation  Patient identified by MRN, date of birth, ID band Patient awake    Reviewed: Allergy & Precautions, NPO status , Patient's Chart, lab work & pertinent test results  Airway Mallampati: I  TM Distance: >3 FB Neck ROM: Full    Dental  (+) Teeth Intact, Dental Advisory Given   Pulmonary former smoker,    breath sounds clear to auscultation       Cardiovascular hypertension, + CAD   Rhythm:Regular Rate:Normal     Neuro/Psych Anxiety    GI/Hepatic negative GI ROS, (+) Cirrhosis     substance abuse  alcohol use,   Endo/Other  diabetes, Type 2, Insulin Dependent, Oral Hypoglycemic Agents  Renal/GU      Musculoskeletal  (+) Arthritis ,   Abdominal Normal abdominal exam  (+)   Peds  Hematology   Anesthesia Other Findings   Reproductive/Obstetrics                            Anesthesia Physical Anesthesia Plan  ASA: III  Anesthesia Plan: General   Post-op Pain Management:    Induction: Intravenous  PONV Risk Score and Plan: 3 and Ondansetron, Treatment may vary due to age or medical condition and Dexamethasone  Airway Management Planned: Oral ETT  Additional Equipment: None  Intra-op Plan:   Post-operative Plan: Extubation in OR  Informed Consent: I have reviewed the patients History and Physical, chart, labs and discussed the procedure including the risks, benefits and alternatives for the proposed anesthesia with the patient or authorized representative who has indicated his/her understanding and acceptance.   Dental advisory given  Plan Discussed with: CRNA  Anesthesia Plan Comments:       Anesthesia Quick Evaluation

## 2017-10-25 NOTE — Anesthesia Postprocedure Evaluation (Signed)
Anesthesia Post Note  Patient: [name]  Procedure(s) Performed: MRI WITH ANESTHESIA (N/A )     Patient location during evaluation: PACU Anesthesia Type: General Level of consciousness: awake and alert Pain management: pain level controlled Vital Signs Assessment: post-procedure vital signs reviewed and stable Respiratory status: spontaneous breathing, nonlabored ventilation, respiratory function stable and patient connected to nasal cannula oxygen Cardiovascular status: blood pressure returned to baseline and stable Postop Assessment: no apparent nausea or vomiting Anesthetic complications: no    Last Vitals:  Vitals:   10/25/17 1349 10/25/17 1351  BP: (!) 85/58 110/76  Pulse: 92 94  Resp: 20   Temp: 36.6 C   SpO2: 98%     Last Pain:  Vitals:   10/25/17 1349  TempSrc: Oral  PainSc:                  Effie Berkshire

## 2017-10-25 NOTE — Progress Notes (Signed)
PROGRESS NOTE [facility]   Matthew Lewis   BXU:[id number] DOB: July 19, 1955  DOA: 10/23/2017 PCP: [name], MD   Brief Narrative:  Matthew Lewis is a 62 year old male with medical history significant for diabetes mellitus type 2, hypertension, alcoholic cirrhosis and history of colon cancer in remission.  Patient presented to the emergency department after being sent from primary care physician for abnormal LFTs.  Patient has been having diarrhea for 2 weeks prior to admission with weight loss and jaundice.  Patient was seen by PCP and started on empiric vancomycin for C. difficile and lab work-up revealed elevated liver enzymes with severely elevated T bili.  Patient admitted with working diagnosis of jaundice. GI was consulted.   Subjective: Patient seen and examined, still with abdominal pain, MRCP shows multiple mass obstructing biliary duct .  Assessment & Plan: Obstructive jaundice  Due to multifocal masses concerning for [facility] and tumor invading the left portal vein portal vein thrombosis.  Mass is obstructing the biliary tree.  GI unable to perform ERCP, IR was consulted and planning for percutaneous biliary drain in a.m.  Continue to monitor LFTs and INR.  Diet advance per GI and n.p.o. after midnight.  Oncology has been consulted.  ? [place] with bilateral adrenal metastases and retroperitoneal nodal metastases Oncology consulted.   Hx of cirrhosis  [place] consistent with cirrhotic pattern, ammonia levels improved, continue low-dose lactulose BID. Management per GI   HTN  BP stable continue current regimen   DM type 2  CBGs stable, continue levamir at reduced dose, cont SSI, check A1C    Diarrhea  Prior to admission was treated with empiric oral vanc for presumed C-diff. Vancomycin has been stopped, patient currently denies diarrhea, if recurs will get stool sample. Continue to monitor      DVT prophylaxis: Lovenox Code Status: Full code Family  Communication: Family at bedside Disposition Plan: TBD  Consultants:   GI  Procedures:   None   Antimicrobials:  None     Objective: Vitals:   10/25/17 1050 10/25/17 1105 10/25/17 1349 10/25/17 1351  BP: 105/69 113/69 (!) 85/58 110/76  Pulse: 98 (!) 102 92 94  Resp: 13 15 20    Temp: 98.1 F (36.7 C)  97.8 F (36.6 C)   TempSrc:   Oral   SpO2: 96% 94% 98%   Weight:      Height:        Intake/Output Summary (Last 24 hours) at 10/25/2017 1553 Last data filed at 10/25/2017 1100 Gross per 24 hour  Intake 30 ml  Output 1200 ml  Net -1170 ml   Filed Weights   10/23/17 1707  Weight: 103 kg    Examination:  General: NAD  HEENT: Scleral icterus  Cardiovascular: RRR, S1/S2 +, no rubs, no gallops Respiratory: CTA no wheezing, or crackles  Abdominal: Soft, diffuse tenderness, non distended.  Extremities: no edema, no cyanosis Skin: Jaundice   Data Reviewed: I have personally reviewed following labs and imaging studies  CBC: Recent Labs  Lab 10/23/17 1746 10/24/17 0549 10/25/17 0551  WBC 8.5 7.7 8.2  NEUTROABS 6.4  --   --   HGB 13.0 12.0* 12.2*  HCT 39.5 35.7* 36.9*  MCV 91.6 92.2 91.8  PLT 273 204 191   Basic Metabolic Panel: Recent Labs  Lab 10/23/17 1746 10/24/17 0549 10/25/17 0551  NA 134* 130* 129*  K 4.2 4.2 4.7  CL 100 99 96*  CO2 22 23 25   GLUCOSE 120* 112* 131*  BUN  13 10 9   CREATININE 0.55* 0.30* 0.38*  CALCIUM 9.6 8.7* 8.8*   GFR: Estimated Creatinine Clearance: 113.1 mL/min (A) (by C-G formula based on SCr of 0.38 mg/dL (L)). Liver Function Tests: Recent Labs  Lab 10/23/17 1746 10/24/17 0549 10/25/17 0551  AST 225* 204* 214*  ALT 220* 187* 197*  ALKPHOS 778* 682* 786*  BILITOT 16.9* 17.2* 20.4*  PROT 8.2* 7.3 7.4  ALBUMIN 3.0* 2.5* 2.4*   Recent Labs  Lab 10/23/17 1746  LIPASE 27   Recent Labs  Lab 10/23/17 1918 10/25/17 0551  AMMONIA 47* 32   Coagulation Profile: Recent Labs  Lab 10/23/17 1918  10/25/17 0551  INR 1.07 1.15   Cardiac Enzymes: No results for input(s): CKTOTAL, CKMB, CKMBINDEX, TROPONINI in the last 168 hours. BNP (last 3 results) No results for input(s): PROBNP in the last 8760 hours. HbA1C: No results for input(s): HGBA1C in the last 72 hours. CBG: Recent Labs  Lab 10/25/17 0018 10/25/17 0402 10/25/17 0748 10/25/17 1050 10/25/17 1258  GLUCAP 136* 118* 130* 130* 127*   Lipid Profile: No results for input(s): CHOL, HDL, LDLCALC, TRIG, CHOLHDL, LDLDIRECT in the last 72 hours. Thyroid Function Tests: No results for input(s): TSH, T4TOTAL, FREET4, T3FREE, THYROIDAB in the last 72 hours. Anemia Panel: No results for input(s): VITAMINB12, FOLATE, FERRITIN, TIBC, IRON, RETICCTPCT in the last 72 hours. Sepsis Labs: No results for input(s): PROCALCITON, LATICACIDVEN in the last 168 hours.  No results found for this or any previous visit (from the past 240 hour(s)).    Radiology Studies: US Abdomen Complete  Result Date: 10/23/2017 CLINICAL DATA:  Jaundiced. EXAM: ABDOMEN ULTRASOUND COMPLETE COMPARISON:  Body CT 02/24/2017 FINDINGS: Gallbladder: No gallstones seen. There is gallbladder wall thickening measuring 6 mm. No sonographic Murphy sign noted by sonographer. Common bile duct: Diameter: 6.3 mm Liver: Coarse nodular echogenicity of the liver. Area of heterogeneity or an ill-defined mass in segment 6 of the liver measures 2.6 x 2.4 x 2.2 cm. Portal vein is patent on color Doppler imaging with normal direction of blood flow towards the liver. IVC: No abnormality visualized. Pancreas: Visualized portion unremarkable. Spleen: Splenomegaly. Splenic length is 16.2 cm with volume of 994 cubic cm. Right Kidney: Length: 14.8 cm. Echogenicity within normal limits. No mass or hydronephrosis visualized. Left Kidney: Length: 14.7 cm. Echogenicity within normal limits. No mass or hydronephrosis visualized. Abdominal aorta: No aneurysm visualized. Other findings: None.  IMPRESSION: Cirrhotic appearance of the liver. 2.6 cm questionable ill-defined mass versus area of focal parenchymal heterogeneity in segment 6 of the liver. If further imaging evaluation is desired, liver protocol abdominal MRI should be considered. Splenomegaly. Apparent thickening of the gallbladder wall, without evidence of cholelithiasis, and with negative sonographic Murphy's sign. The findings are favored to represent gallbladder wall edema secondary to systemic causes such as hyperproteinemia rather than acute cholecystitis. Please correlate clinically. Electronically Signed   By: Fidela Salisbury M.D.   On: 10/23/2017 20:58   Mr 3d Recon At Scanner  Result Date: 10/25/2017 CLINICAL DATA:  Evaluate liver lesion. EXAM: MRI ABDOMEN WITHOUT AND WITH CONTRAST (INCLUDING MRCP) TECHNIQUE: Multiplanar multisequence MR imaging of the abdomen was performed both before and after the administration of intravenous contrast. Heavily T2-weighted images of the biliary and pancreatic ducts were obtained, and three-dimensional MRCP images were rendered by post processing. CONTRAST:  10 cc Gadavist COMPARISON:  CT AP 02/24/2017. FINDINGS: Lower chest: Bilateral airspace consolidation identified. Hepatobiliary: The liver is cirrhotic. There is a diffuse nodular contour with hypertrophy  of the caudate lobe and lateral segment of left lobe. There has been interval development of multiple suspicious enhancing liver lesions involving both lobes. -index lesion within the anterior aspect of segment 7 measures 3.6 cm, image 30/26. -Index lesion within segment 6 measures 2.4 cm, image 63/26. -index lesion within segment 2 measures 1.3 cm, image 45/28. -index lesion is within segment 4B measures 4.3 cm, image 45/26. There is associated tumor thrombus involving the left branch of the portal vein, image 39/28. Additionally, this mass obstructs the right medial hepatic duct and left main hepatic duct. The right lateral (posterior)  duct appears mildly dilated and may be partially obstructed. Mild gallbladder wall edema and pericholecystic fluid. No common bile duct dilatation. Pancreas: No mass, inflammatory changes, or other parenchymal abnormality identified. Spleen:  Splenomegaly. Adrenals/Urinary Tract: New bilateral adrenal lesions compatible with metastatic disease. Right adrenal nodule measures 4 cm, image 49/32. The left adrenal metastasis measures 1.7 cm, image 56/32. Unremarkable appearance of the kidneys. Stomach/Bowel: Visualized portions within the abdomen are unremarkable. Vascular/Lymphatic: Aortic atherosclerosis. No aneurysm. The main portal vein appears patent. Interval development of retroperitoneal adenopathy. Index right retrocaval node measures 1.7 cm, image 79/28. Small distal esophageal and perigastric varices. Other:  Mild perihepatic ascites. Musculoskeletal: No suspicious bone lesions identified. IMPRESSION: 1. Advanced changes of cirrhosis. Interval development of multifocal enhancing liver lesions, which is concerning for multicentric hepatocellular carcinoma involving both lobes of liver. 2. Lesion within segment 4 B obstructs the right medial hepatic duct and left main duct resulting in moderate to marked biliary ductal dilatation within the left lobe and dome of liver. There may be partial obstruction of the right lateral duct with mild dilatation. 3. Tumor thrombus within the left branch of the portal vein likely secondary to segment 4 B tumor. 4. Bilateral adrenal metastasis and retroperitoneal nodal metastasis. 5. Stigmata of portal venous hypertension including varices, ascites and splenomegaly. 6. These results will be called to the ordering clinician or representative by the Radiologist Assistant, and communication documented in the PACS or zVision Dashboard. Electronically Signed   By: Kerby Moors M.D.   On: 10/25/2017 13:31   Mr Abdomen Mrcp Moise Boring Contast  Result Date: 10/25/2017 CLINICAL DATA:   Evaluate liver lesion. EXAM: MRI ABDOMEN WITHOUT AND WITH CONTRAST (INCLUDING MRCP) TECHNIQUE: Multiplanar multisequence MR imaging of the abdomen was performed both before and after the administration of intravenous contrast. Heavily T2-weighted images of the biliary and pancreatic ducts were obtained, and three-dimensional MRCP images were rendered by post processing. CONTRAST:  10 cc Gadavist COMPARISON:  CT AP 02/24/2017. FINDINGS: Lower chest: Bilateral airspace consolidation identified. Hepatobiliary: The liver is cirrhotic. There is a diffuse nodular contour with hypertrophy of the caudate lobe and lateral segment of left lobe. There has been interval development of multiple suspicious enhancing liver lesions involving both lobes. -index lesion within the anterior aspect of segment 7 measures 3.6 cm, image 30/26. -Index lesion within segment 6 measures 2.4 cm, image 63/26. -index lesion within segment 2 measures 1.3 cm, image 45/28. -index lesion is within segment 4B measures 4.3 cm, image 45/26. There is associated tumor thrombus involving the left branch of the portal vein, image 39/28. Additionally, this mass obstructs the right medial hepatic duct and left main hepatic duct. The right lateral (posterior) duct appears mildly dilated and may be partially obstructed. Mild gallbladder wall edema and pericholecystic fluid. No common bile duct dilatation. Pancreas: No mass, inflammatory changes, or other parenchymal abnormality identified. Spleen:  Splenomegaly. Adrenals/Urinary Tract:  New bilateral adrenal lesions compatible with metastatic disease. Right adrenal nodule measures 4 cm, image 49/32. The left adrenal metastasis measures 1.7 cm, image 56/32. Unremarkable appearance of the kidneys. Stomach/Bowel: Visualized portions within the abdomen are unremarkable. Vascular/Lymphatic: Aortic atherosclerosis. No aneurysm. The main portal vein appears patent. Interval development of retroperitoneal adenopathy.  Index right retrocaval node measures 1.7 cm, image 79/28. Small distal esophageal and perigastric varices. Other:  Mild perihepatic ascites. Musculoskeletal: No suspicious bone lesions identified. IMPRESSION: 1. Advanced changes of cirrhosis. Interval development of multifocal enhancing liver lesions, which is concerning for multicentric hepatocellular carcinoma involving both lobes of liver. 2. Lesion within segment 4 B obstructs the right medial hepatic duct and left main duct resulting in moderate to marked biliary ductal dilatation within the left lobe and dome of liver. There may be partial obstruction of the right lateral duct with mild dilatation. 3. Tumor thrombus within the left branch of the portal vein likely secondary to segment 4 B tumor. 4. Bilateral adrenal metastasis and retroperitoneal nodal metastasis. 5. Stigmata of portal venous hypertension including varices, ascites and splenomegaly. 6. These results will be called to the ordering clinician or representative by the Radiologist Assistant, and communication documented in the PACS or zVision Dashboard. Electronically Signed   By: Kerby Moors M.D.   On: 10/25/2017 13:31     Scheduled Meds: . feeding supplement  1 Container Oral TID BM  . gabapentin  400 mg Oral Daily  . gabapentin  800 mg Oral QHS  . insulin aspart  0-9 Units Subcutaneous Q4H  . insulin detemir  41 Units Subcutaneous Daily  . insulin detemir  6 Units Subcutaneous QHS  . lactulose  10 g Oral BID  . lisinopril  20 mg Oral Daily  . multivitamin with minerals  1 tablet Oral Daily  . protein supplement  2 oz Oral TID   Continuous Infusions: . heparin 1,450 Units/hr (10/25/17 1449)     LOS: 2 days    Time spent: Total of 35 minutes spent with pt, greater than 50% of which was spent in discussion of  treatment, counseling and coordination of care   Chipper Oman, MD Pager: Text Page via [email or web address]   If 7PM-7AM, please contact  night-coverage [email or web address] 10/25/2017, 3:53 PM   Note - This record has been created using [employer]. Chart creation errors have been sought, but may not always have been located. Such creation errors do not reflect on the standard of medical care.

## 2017-10-25 NOTE — Progress Notes (Signed)
CRITICAL VALUE ALERT  Critical Value: Total Bilirubin  Date & Time Notied:  10/25/17  Provider Notified: Dr.Silva Via Amion  Orders Received/Actions taken: waiting and continue to monitor.

## 2017-10-25 NOTE — Progress Notes (Signed)
Progress Note   Subjective  Patient completed MRI abdomen with MRCP with monitored anesthesia care Feels about the same today, diffuse abdominal discomfort No bleeding No itching No fevers   Objective   Vital signs in last 24 hours: Temp:  [97.8 F (36.6 C)-98.5 F (36.9 C)] 97.8 F (36.6 C) (10/19 1349) Pulse Rate:  [92-102] 94 (10/19 1351) Resp:  [13-20] 20 (10/19 1349) BP: (85-116)/(58-76) 110/76 (10/19 1351) SpO2:  [94 %-99 %] 98 % (10/19 1349) Last BM Date: 10/24/17 Gen: awake, alert, NAD, deeply jaundiced HEENT: icteric, op clear CV: Mild tachycardia, regular Pulm: CTA b/l Abd: soft, obese, mild diffuse tenderness without rebound or guarding, +BS throughout Ext: no c/c/e Neuro: nonfocal, no asterixis   Intake/Output from previous day: 10/18 0701 - 10/19 0700 In: 0  Out: 2075 [Urine:2075] Intake/Output this shift: Total I/O In: 30 [P.O.:30] Out: -   Lab Results: Recent Labs    10/23/17 1746 10/24/17 0549 10/25/17 0551  WBC 8.5 7.7 8.2  HGB 13.0 12.0* 12.2*  HCT 39.5 35.7* 36.9*  PLT 273 204 225   BMET Recent Labs    10/23/17 1746 10/24/17 0549 10/25/17 0551  NA 134* 130* 129*  K 4.2 4.2 4.7  CL 100 99 96*  CO2 22 23 25   GLUCOSE 120* 112* 131*  BUN 13 10 9   CREATININE 0.55* 0.30* 0.38*  CALCIUM 9.6 8.7* 8.8*   LFT Recent Labs    10/25/17 0551  PROT 7.4  ALBUMIN 2.4*  AST 214*  ALT 197*  ALKPHOS 786*  BILITOT 20.4*  BILIDIR 13.4*  IBILI 7.0*   PT/INR Recent Labs    10/23/17 1918 10/25/17 0551  LABPROT 13.8 14.6  INR 1.07 1.15    Studies/Results: US Abdomen Complete  Result Date: 10/23/2017 CLINICAL DATA:  Jaundiced. EXAM: ABDOMEN ULTRASOUND COMPLETE COMPARISON:  Body CT 02/24/2017 FINDINGS: Gallbladder: No gallstones seen. There is gallbladder wall thickening measuring 6 mm. No sonographic Murphy sign noted by sonographer. Common bile duct: Diameter: 6.3 mm Liver: Coarse nodular echogenicity of the liver. Area of  heterogeneity or an ill-defined mass in segment 6 of the liver measures 2.6 x 2.4 x 2.2 cm. Portal vein is patent on color Doppler imaging with normal direction of blood flow towards the liver. IVC: No abnormality visualized. Pancreas: Visualized portion unremarkable. Spleen: Splenomegaly. Splenic length is 16.2 cm with volume of 994 cubic cm. Right Kidney: Length: 14.8 cm. Echogenicity within normal limits. No mass or hydronephrosis visualized. Left Kidney: Length: 14.7 cm. Echogenicity within normal limits. No mass or hydronephrosis visualized. Abdominal aorta: No aneurysm visualized. Other findings: None. IMPRESSION: Cirrhotic appearance of the liver. 2.6 cm questionable ill-defined mass versus area of focal parenchymal heterogeneity in segment 6 of the liver. If further imaging evaluation is desired, liver protocol abdominal MRI should be considered. Splenomegaly. Apparent thickening of the gallbladder wall, without evidence of cholelithiasis, and with negative sonographic Murphy's sign. The findings are favored to represent gallbladder wall edema secondary to systemic causes such as hyperproteinemia rather than acute cholecystitis. Please correlate clinically. Electronically Signed   By: Fidela Salisbury M.D.   On: 10/23/2017 20:58   Mr 3d Recon At Scanner  Result Date: 10/25/2017 CLINICAL DATA:  Evaluate liver lesion. EXAM: MRI ABDOMEN WITHOUT AND WITH CONTRAST (INCLUDING MRCP) TECHNIQUE: Multiplanar multisequence MR imaging of the abdomen was performed both before and after the administration of intravenous contrast. Heavily T2-weighted images of the biliary and pancreatic ducts were obtained, and three-dimensional MRCP images were rendered by  post processing. CONTRAST:  10 cc Gadavist COMPARISON:  CT AP 02/24/2017. FINDINGS: Lower chest: Bilateral airspace consolidation identified. Hepatobiliary: The liver is cirrhotic. There is a diffuse nodular contour with hypertrophy of the caudate lobe and  lateral segment of left lobe. There has been interval development of multiple suspicious enhancing liver lesions involving both lobes. -index lesion within the anterior aspect of segment 7 measures 3.6 cm, image 30/26. -Index lesion within segment 6 measures 2.4 cm, image 63/26. -index lesion within segment 2 measures 1.3 cm, image 45/28. -index lesion is within segment 4B measures 4.3 cm, image 45/26. There is associated tumor thrombus involving the left branch of the portal vein, image 39/28. Additionally, this mass obstructs the right medial hepatic duct and left main hepatic duct. The right lateral (posterior) duct appears mildly dilated and may be partially obstructed. Mild gallbladder wall edema and pericholecystic fluid. No common bile duct dilatation. Pancreas: No mass, inflammatory changes, or other parenchymal abnormality identified. Spleen:  Splenomegaly. Adrenals/Urinary Tract: New bilateral adrenal lesions compatible with metastatic disease. Right adrenal nodule measures 4 cm, image 49/32. The left adrenal metastasis measures 1.7 cm, image 56/32. Unremarkable appearance of the kidneys. Stomach/Bowel: Visualized portions within the abdomen are unremarkable. Vascular/Lymphatic: Aortic atherosclerosis. No aneurysm. The main portal vein appears patent. Interval development of retroperitoneal adenopathy. Index right retrocaval node measures 1.7 cm, image 79/28. Small distal esophageal and perigastric varices. Other:  Mild perihepatic ascites. Musculoskeletal: No suspicious bone lesions identified. IMPRESSION: 1. Advanced changes of cirrhosis. Interval development of multifocal enhancing liver lesions, which is concerning for multicentric hepatocellular carcinoma involving both lobes of liver. 2. Lesion within segment 4 B obstructs the right medial hepatic duct and left main duct resulting in moderate to marked biliary ductal dilatation within the left lobe and dome of liver. There may be partial obstruction  of the right lateral duct with mild dilatation. 3. Tumor thrombus within the left branch of the portal vein likely secondary to segment 4 B tumor. 4. Bilateral adrenal metastasis and retroperitoneal nodal metastasis. 5. Stigmata of portal venous hypertension including varices, ascites and splenomegaly. 6. These results will be called to the ordering clinician or representative by the Radiologist Assistant, and communication documented in the PACS or zVision Dashboard. Electronically Signed   By: Kerby Moors M.D.   On: 10/25/2017 13:31   Mr Abdomen Mrcp Moise Boring Contast  Result Date: 10/25/2017 CLINICAL DATA:  Evaluate liver lesion. EXAM: MRI ABDOMEN WITHOUT AND WITH CONTRAST (INCLUDING MRCP) TECHNIQUE: Multiplanar multisequence MR imaging of the abdomen was performed both before and after the administration of intravenous contrast. Heavily T2-weighted images of the biliary and pancreatic ducts were obtained, and three-dimensional MRCP images were rendered by post processing. CONTRAST:  10 cc Gadavist COMPARISON:  CT AP 02/24/2017. FINDINGS: Lower chest: Bilateral airspace consolidation identified. Hepatobiliary: The liver is cirrhotic. There is a diffuse nodular contour with hypertrophy of the caudate lobe and lateral segment of left lobe. There has been interval development of multiple suspicious enhancing liver lesions involving both lobes. -index lesion within the anterior aspect of segment 7 measures 3.6 cm, image 30/26. -Index lesion within segment 6 measures 2.4 cm, image 63/26. -index lesion within segment 2 measures 1.3 cm, image 45/28. -index lesion is within segment 4B measures 4.3 cm, image 45/26. There is associated tumor thrombus involving the left branch of the portal vein, image 39/28. Additionally, this mass obstructs the right medial hepatic duct and left main hepatic duct. The right lateral (posterior) duct appears mildly dilated  and may be partially obstructed. Mild gallbladder wall edema and  pericholecystic fluid. No common bile duct dilatation. Pancreas: No mass, inflammatory changes, or other parenchymal abnormality identified. Spleen:  Splenomegaly. Adrenals/Urinary Tract: New bilateral adrenal lesions compatible with metastatic disease. Right adrenal nodule measures 4 cm, image 49/32. The left adrenal metastasis measures 1.7 cm, image 56/32. Unremarkable appearance of the kidneys. Stomach/Bowel: Visualized portions within the abdomen are unremarkable. Vascular/Lymphatic: Aortic atherosclerosis. No aneurysm. The main portal vein appears patent. Interval development of retroperitoneal adenopathy. Index right retrocaval node measures 1.7 cm, image 79/28. Small distal esophageal and perigastric varices. Other:  Mild perihepatic ascites. Musculoskeletal: No suspicious bone lesions identified. IMPRESSION: 1. Advanced changes of cirrhosis. Interval development of multifocal enhancing liver lesions, which is concerning for multicentric hepatocellular carcinoma involving both lobes of liver. 2. Lesion within segment 4 B obstructs the right medial hepatic duct and left main duct resulting in moderate to marked biliary ductal dilatation within the left lobe and dome of liver. There may be partial obstruction of the right lateral duct with mild dilatation. 3. Tumor thrombus within the left branch of the portal vein likely secondary to segment 4 B tumor. 4. Bilateral adrenal metastasis and retroperitoneal nodal metastasis. 5. Stigmata of portal venous hypertension including varices, ascites and splenomegaly. 6. These results will be called to the ordering clinician or representative by the Radiologist Assistant, and communication documented in the PACS or zVision Dashboard. Electronically Signed   By: Kerby Moors M.D.   On: 10/25/2017 13:31       Assessment / Plan:   62 year old male with a history of previously compensated cirrhosis secondary to alcohol and possibly NASH presenting with markedly  abnormal liver enzymes, jaundice and diffuse abdominal discomfort found to have multifocal HCC with tumor invading the left portal vein, concern for adrenal and retroperitoneal nodal metastasis  1.  Multifocal HCC causing biliary obstruction; tumor causing portal vein thrombosis--I discussed the diagnosis as made by MRI with the patient today.  Also discussed the imaging with Drs. Waldon Merl and [place]. --Given the intrahepatic nature of this multifocal malignancy I do not think ERCP would provide adequate biliary drainage; thus I have consulted IR for percutaneous biliary drain(s) --IV heparin drip per pharmacy for portal vein thrombosis (heparin will need to be held but Dr. Earleen Newport will determine timing for probable percutaneous bile drain tomorrow) --Oncology consultation --Repeat LFTs and INR tomorrow --Can have regular diet now but n.p.o. after midnight       Principal Problem:   Obstructive jaundice Active Problems:   Hypertension   Diabetes mellitus ([place])   Hx of substance abuse- clean several years now   Alcoholic cirrhosis of liver without ascites ([place])   Transaminitis   Liver lesion   History of colon cancer     LOS: 2 days   Lajuan Lines Pyrtle  10/25/2017, 3:09 PM

## 2017-10-25 NOTE — Progress Notes (Signed)
RN verified the presence of a signed informed consent that matches stated procedure by patient. Verified armband matches patient's stated name and birth date. Verified NPO status (2359 10/24/17) and that all jewelry, contact, glasses, dentures, and partials had been removed (if applicable). Belongings taken to PACU

## 2017-10-25 NOTE — Transfer of Care (Signed)
Immediate Anesthesia Transfer of Care Note  Patient: Matthew Lewis  Procedure(s) Performed: MRI WITH ANESTHESIA (N/A )  Patient Location: PACU  Anesthesia Type:General  Level of Consciousness: awake, alert , oriented and patient cooperative  Airway & Oxygen Therapy: Patient Spontanous Breathing and Patient connected to nasal cannula oxygen  Post-op Assessment: Report given to RN and Post -op Vital signs reviewed and stable  Post vital signs: Reviewed and stable  Last Vitals:  Vitals Value Taken Time  BP 105/69 10/25/2017 10:49 AM  Temp    Pulse 97 10/25/2017 10:51 AM  Resp 13 10/25/2017 10:51 AM  SpO2 96 % 10/25/2017 10:51 AM  Vitals shown include unvalidated device data.  Last Pain:  Vitals:   10/25/17 0404  TempSrc: Oral  PainSc:       Patients Stated Pain Goal: 0 (35/07/57 3225)  Complications: No apparent anesthesia complications

## 2017-10-25 NOTE — Progress Notes (Signed)
ANTICOAGULATION CONSULT NOTE   Pharmacy Consult for heparin Indication: portal vein thrombosis  Allergies  Allergen Reactions  . Morphine And Related Other (See Comments)    PATIENT IS RECOVERING FROM DRUG ADDICTION PATIENT WANTS TO AVOID ANY NARCOTICS  . Other Other (See Comments)    PATIENT IS RECOVERING FROM DRUG ADDICTION PATIENT WANTS TO AVOID ANY NARCOTICS    Patient Measurements: Height: 5' 10.5" (179.1 cm) Weight: 227 lb (103 kg) IBW/kg (Calculated) : 74.15 Heparin Dosing Weight: 96 kg  Vital Signs: Temp: 97.8 F (36.6 C) (10/19 1349) Temp Source: Oral (10/19 1349) BP: 110/76 (10/19 1351) Pulse Rate: 94 (10/19 1351)  Labs: Recent Labs    10/23/17 1746 10/23/17 1918 10/24/17 0549 10/25/17 0551  HGB 13.0  --  12.0* 12.2*  HCT 39.5  --  35.7* 36.9*  PLT 273  --  204 225  LABPROT  --  13.8  --  14.6  INR  --  1.07  --  1.15  CREATININE 0.55*  --  0.30* 0.38*    Estimated Creatinine Clearance: 113.1 mL/min (A) (by C-G formula based on SCr of 0.38 mg/dL (L)).   Medical History: Past Medical History:  Diagnosis Date  . Abscess of anal and rectal regions    horse shoe abscess  . Alcoholic cirrhosis ([place])   . Arthritis    "everywhere; especially in my spine"  . Cirrhosis ([place])    STAGE 1 CIRRHOSIS PATIENT SEES DR NAMDIGAM FOR  . Colon cancer ([place]) 05/2005   TX SURGERY WITH LYMPH NODE REMOVAL  . Cough    LAST FEW WEEKS SAW DR RAMOS LUNGS MILKY SPUTUM OCC  . DM type 2 with diabetic peripheral neuropathy ([facility])    left foot  . Fatigue   . Fistula, anal    multiple  . History of substance abuse ([place])    last alcohol was 08/2011; last marijuania was 08/2011  . Hypertension   . PTSD (post-traumatic stress disorder)    "severe" HX OF  . Sleep deprivation     Assessment: Patient is a 62 y.o M with hx EtOH abuse, cirrhosis and colon cancer presented to the ED on 10/23/17 with jaundice.  LFTS and Tbili elevated on admission. INR wnl. MRCP on 10/19 showed  portal vein thrombosis. To start heparin drip on 10/19 for thrombosis.  Today, 10/25/2017: - cbc stable - INR 1.15 - LFTs and Tbili remain elevated - last lovenox 40 mg SQ dose given on 10/18 at 1129.  Goal of Therapy:  Heparin level 0.3-0.7 units/ml Monitor platelets by anticoagulation protocol: Yes   Plan:  - d/c lovenox 40 mg SQ - With risk for bleeding d/t cirrhosis/elevated LFTs, will give smaller heparin bolus of 2500 units IV x1 and start drip at 1450 units/hr. - check 6 hr heparin level - monitor for s/s bleeding  Lateria Alderman P 10/25/2017,2:07 PM

## 2017-10-25 NOTE — H&P (Signed)
Chief Complaint: Biliary obstruction  Referring Physician(s): Levin Erp  Supervising Physician: Corrie Mckusick  Patient Status: [facility] - In-pt  History of Present Illness: Matthew Lewis is a 62 y.o. male with a history of previously compensated cirrhosis secondary to alcohol and possibly NASH.  He presented to the ED with markedly abnormal liver enzymes, jaundice and diffuse abdominal discomfort in the last several weeks.   His bilirubin yesterday was 17.2 and today is up to 20.4.  His alkaline phosphatase 862, AST 204, ALT 187.    His INR is 1.07.    He had a complete abdominal ultrasound which showed a cirrhotic appearing liver.    There was a question of a 2.6 ill-defined mass versus parenchymal heterogeneity in segment 6 of the liver.    Gallbladder wall appeared thick without stones or Murphy sign.  Portal vein is patent.  The bile duct was 6.3 mm.  MRCP =  IMPRESSION: 1. Advanced changes of cirrhosis. Interval development of multifocal enhancing liver lesions, which is concerning for multicentric hepatocellular carcinoma involving both lobes of liver. 2. Lesion within segment 4 B obstructs the right medial hepatic duct and left main duct resulting in moderate to marked biliary ductal dilatation within the left lobe and dome of liver. There may be partial obstruction of the right lateral duct with mild dilatation. 3. Tumor thrombus within the left branch of the portal vein likely secondary to segment 4 B tumor. 4. Bilateral adrenal metastasis and retroperitoneal nodal metastasis. 5. Stigmata of portal venous hypertension including varices, ascites and splenomegaly.  We are asked to evaluate for placement of a percutaneous transhepatic cholangiogram and drain placement.  Past Medical History:  Diagnosis Date  . Abscess of anal and rectal regions    horse shoe abscess  . Alcoholic cirrhosis ([place])   . Arthritis    "everywhere; especially in  my spine"  . Cirrhosis ([place])    STAGE 1 CIRRHOSIS PATIENT SEES DR NAMDIGAM FOR  . Colon cancer ([place]) 05/2005   TX SURGERY WITH LYMPH NODE REMOVAL  . Cough    LAST FEW WEEKS SAW DR RAMOS LUNGS MILKY SPUTUM OCC  . DM type 2 with diabetic peripheral neuropathy (HCC)    left foot  . Fatigue   . Fistula, anal    multiple  . History of substance abuse ([place])    last alcohol was 08/2011; last marijuania was 08/2011  . Hypertension   . PTSD (post-traumatic stress disorder)    "severe" HX OF  . Sleep deprivation     Past Surgical History:  Procedure Laterality Date  . ABSCESS DRAINAGE     "probably 15-20 so far at least; rectal"  . ANAL FISTULECTOMY  12/13/1998  . ANTERIOR FUSION CERVICAL SPINE  2010   "triple"; "did 2 surgeries on the same day in 6314 due to complications"  . CORONARY ANGIOPLASTY  2001  . detached muscle  2010   right chest; "after cervical fusion complications"  . ELBOW SURGERY  2007   "cut out part of a muscle"; left  . EXAMINATION UNDER ANESTHESIA  03/22/2005   fistula  . EXAMINATION UNDER ANESTHESIA  06/06/2011   Procedure: EXAM UNDER ANESTHESIA;  Surgeon: Stark Klein, MD;  Location: [place];  Service: General;  Laterality: N/A;  . HEMICOLECTOMY  06/06/2005   left  . INCISION AND DRAINAGE PERIRECTAL ABSCESS  03/30/2005  . INCISION AND DRAINAGE PERIRECTAL ABSCESS  12/13/2004  . INCISION AND DRAINAGE PERIRECTAL ABSCESS  07/14/2001  .  INCISION AND DRAINAGE PERIRECTAL ABSCESS  08/12/2010   horseshoe abscess; Dr Redmond Pulling  . INCISION AND DRAINAGE PERIRECTAL ABSCESS  06/06/2011   Procedure: IRRIGATION AND DEBRIDEMENT PERIRECTAL ABSCESS;  Surgeon: Stark Klein, MD;  Location: [place];  Service: General;  Laterality: N/A;  . INCISION AND DRAINAGE PERIRECTAL ABSCESS N/A 02/24/2017   Procedure: IRRIGATION AND DEBRIDEMENT PERIRECTAL ABSCESS;  Surgeon: Ralene Ok, MD;  Location: [place];  Service: General;  Laterality: N/A;  . LUMBAR [place]  . PERCUTANEOUS PINNING PHALANX  FRACTURE OF HAND  ~ 2008   "plates in 2 places"; right  . PLACEMENT OF SETON N/A 09/05/2017   Procedure: PLACEMENT OF SETON;  Surgeon: Leighton Ruff, MD;  Location: [facility];  Service: General;  Laterality: N/A;  . THORACIC DISCECTOMY  1990's    Allergies: Morphine and related and Other  Medications: Prior to Admission medications   Medication Sig Start Date End Date Taking? Authorizing Provider  aspirin EC 81 MG tablet Take 81 mg by mouth daily.   Yes [provider]  atorvastatin (LIPITOR) 40 MG tablet Take 1 tablet (40 mg total) by mouth daily at 6 PM. Patient taking differently: Take 20 mg by mouth daily at 6 PM.  12/02/12  Yes Viyuoh, Adeline C, MD  calcium carbonate (TUMS - DOSED IN MG ELEMENTAL CALCIUM) 500 MG chewable tablet Chew 1 tablet by mouth 2 (two) times daily as needed for indigestion or heartburn.   Yes [provider]  celecoxib (CELEBREX) 200 MG capsule Take 1 capsule by mouth daily. 10/10/17  Yes [provider]  empagliflozin (JARDIANCE) 10 MG TABS tablet Take 10 mg by mouth at bedtime.    Yes [provider]  Fexofenadine-Pseudoephedrine (ALLEGRA-D 24 HOUR PO) Take by mouth daily. WAL/FEX   Yes [provider]  gabapentin (NEURONTIN) 400 MG capsule Take 400-800 mg by mouth 2 (two) times daily. 1 capsule am, 2 capsules pm   Yes [provider]  glipiZIDE (GLUCOTROL) 5 MG tablet Take 5 mg by mouth 2 (two) times daily before a meal.   Yes [provider]  Insulin Detemir (LEVEMIR FLEXPEN) 100 UNIT/ML Pen Inject 10-60 Units into the skin 2 (two) times daily. Take 62 units in the am and Take 10 units in the pm   Yes [provider]  lisinopril (PRINIVIL,ZESTRIL) 20 MG tablet Take 20 mg by mouth daily.   Yes [provider]  metFORMIN (GLUCOPHAGE) 1000 MG tablet Take 1,000 mg by mouth 2 (two) times daily with a meal.   Yes [provider]  Multiple Vitamin (MULTIVITAMIN)  tablet Take 1 tablet by mouth daily.   Yes [provider]  polyvinyl alcohol (LIQUIFILM TEARS) 1.4 % ophthalmic solution 1 drop as needed for dry eyes.   Yes [provider]  vancomycin (VANCOCIN) 125 MG capsule Take 1 capsule by mouth every 6 (six) hours. 10/23/17  Yes [provider]     Family History  Problem Relation Age of Onset  . Heart disease Father   . Heart disease Mother   . Cancer Mother   . Cancer Sister   . Heart disease Brother     Social History   Socioeconomic History  . Marital status: Married    Spouse name: Not on file  . Number of children: Not on file  . Years of education: Not on file  . Highest education level: Not on file  Occupational History  . Occupation: works at [employer]  Social Needs  . Financial resource strain: Not on file  . Food insecurity:    Worry: Not on file    Inability: Not on file  . Transportation needs:    Medical: Not on file    Non-medical: Not on file  Tobacco Use  . Smoking status: Former Smoker    Packs/day: 1.00    Years: 10.00    Pack years: 10.00    Types: Cigarettes    Last attempt to quit: 01/08/1991    Years since quitting: 26.8  . Smokeless tobacco: Never Used  Substance and Sexual Activity  . Alcohol use: Yes    Comment: 06/05/11 "drank enough when I was younger to last me my whole life; don't remember when I had my last drink, maybe 2012"  . Drug use: Not Currently    Types: Oxycodone, Marijuana    Comment: 06/05/11 "pot head in my younger days; 2009 dr had me on oxycodone and valium for almost 39yr  . Sexual activity: Not Currently  Lifestyle  . Physical activity:    Days per week: Not on file    Minutes per session: Not on file  . Stress: Not on file  Relationships  . Social connections:    Talks on phone: Not on file    Gets together: Not on file    Attends religious service: Not on file    Active member of club or organization: Not on file    Attends meetings of  clubs or organizations: Not on file    Relationship status: Not on file  Other Topics Concern  . Not on file  Social History Narrative  . Not on file     Review of Systems: A 12 point ROS discussed and pertinent positives are indicated in the HPI above.  All other systems are negative.  Review of Systems  Vital Signs: BP 110/76   Pulse 94   Temp 97.8 F (36.6 C) (Oral)   Resp 20   Ht 5' 10.5" (1.791 m)   Wt 103 kg   SpO2 98%   BMI 32.11 kg/m   Physical Exam  Constitutional: He is oriented to person, place, and time. He appears well-developed.  HENT:  Head: Normocephalic and atraumatic.  Eyes: Scleral icterus is present.  Neck: Normal range of motion.  Cardiovascular: Normal rate, regular rhythm and normal heart sounds.  Pulmonary/Chest: Effort normal and breath sounds normal.  Musculoskeletal: Normal range of motion.  Neurological: He is alert and oriented to person, place, and time.  Skin:  jaundiced  Psychiatric: He has a normal mood and affect. His behavior is normal. Judgment and thought content normal.  Vitals reviewed.   Imaging: [place]Abdomen Complete  Result Date: 10/23/2017 CLINICAL DATA:  Jaundiced. EXAM: ABDOMEN ULTRASOUND COMPLETE COMPARISON:  Body CT 02/24/2017 FINDINGS: Gallbladder: No gallstones seen. There is gallbladder wall thickening measuring 6 mm. No sonographic Murphy sign noted by sonographer. Common bile duct: Diameter: 6.3 mm Liver: Coarse nodular echogenicity of the liver. Area of heterogeneity or an ill-defined mass in segment 6 of the liver measures 2.6 x 2.4 x 2.2 cm. Portal vein is patent on color Doppler imaging with normal direction of blood flow towards the liver. IVC: No abnormality visualized. Pancreas: Visualized portion unremarkable. Spleen: Splenomegaly. Splenic length is 16.2 cm with volume of 994 cubic cm. Right Kidney: Length: 14.8 cm. Echogenicity within normal limits. No mass or hydronephrosis visualized. Left Kidney: Length: 14.7  cm. Echogenicity within normal limits. No mass or hydronephrosis visualized.  Abdominal aorta: No aneurysm visualized. Other findings: None. IMPRESSION: Cirrhotic appearance of the liver. 2.6 cm questionable ill-defined mass versus area of focal parenchymal heterogeneity in segment 6 of the liver. If further imaging evaluation is desired, liver protocol abdominal MRI should be considered. Splenomegaly. Apparent thickening of the gallbladder wall, without evidence of cholelithiasis, and with negative sonographic Murphy's sign. The findings are favored to represent gallbladder wall edema secondary to systemic causes such as hyperproteinemia rather than acute cholecystitis. Please correlate clinically. Electronically Signed   By: Fidela Salisbury M.D.   On: 10/23/2017 20:58   Mr 3d Recon At Scanner  Result Date: 10/25/2017 CLINICAL DATA:  Evaluate liver lesion. EXAM: MRI ABDOMEN WITHOUT AND WITH CONTRAST (INCLUDING MRCP) TECHNIQUE: Multiplanar multisequence MR imaging of the abdomen was performed both before and after the administration of intravenous contrast. Heavily T2-weighted images of the biliary and pancreatic ducts were obtained, and three-dimensional MRCP images were rendered by post processing. CONTRAST:  10 cc Gadavist COMPARISON:  CT AP 02/24/2017. FINDINGS: Lower chest: Bilateral airspace consolidation identified. Hepatobiliary: The liver is cirrhotic. There is a diffuse nodular contour with hypertrophy of the caudate lobe and lateral segment of left lobe. There has been interval development of multiple suspicious enhancing liver lesions involving both lobes. -index lesion within the anterior aspect of segment 7 measures 3.6 cm, image 30/26. -Index lesion within segment 6 measures 2.4 cm, image 63/26. -index lesion within segment 2 measures 1.3 cm, image 45/28. -index lesion is within segment 4B measures 4.3 cm, image 45/26. There is associated tumor thrombus involving the left branch of the portal  vein, image 39/28. Additionally, this mass obstructs the right medial hepatic duct and left main hepatic duct. The right lateral (posterior) duct appears mildly dilated and may be partially obstructed. Mild gallbladder wall edema and pericholecystic fluid. No common bile duct dilatation. Pancreas: No mass, inflammatory changes, or other parenchymal abnormality identified. Spleen:  Splenomegaly. Adrenals/Urinary Tract: New bilateral adrenal lesions compatible with metastatic disease. Right adrenal nodule measures 4 cm, image 49/32. The left adrenal metastasis measures 1.7 cm, image 56/32. Unremarkable appearance of the kidneys. Stomach/Bowel: Visualized portions within the abdomen are unremarkable. Vascular/Lymphatic: Aortic atherosclerosis. No aneurysm. The main portal vein appears patent. Interval development of retroperitoneal adenopathy. Index right retrocaval node measures 1.7 cm, image 79/28. Small distal esophageal and perigastric varices. Other:  Mild perihepatic ascites. Musculoskeletal: No suspicious bone lesions identified. IMPRESSION: 1. Advanced changes of cirrhosis. Interval development of multifocal enhancing liver lesions, which is concerning for multicentric hepatocellular carcinoma involving both lobes of liver. 2. Lesion within segment 4 B obstructs the right medial hepatic duct and left main duct resulting in moderate to marked biliary ductal dilatation within the left lobe and dome of liver. There may be partial obstruction of the right lateral duct with mild dilatation. 3. Tumor thrombus within the left branch of the portal vein likely secondary to segment 4 B tumor. 4. Bilateral adrenal metastasis and retroperitoneal nodal metastasis. 5. Stigmata of portal venous hypertension including varices, ascites and splenomegaly. 6. These results will be called to the ordering clinician or representative by the Radiologist Assistant, and communication documented in the PACS or zVision Dashboard.  Electronically Signed   By: Kerby Moors M.D.   On: 10/25/2017 13:31   Mr Abdomen Mrcp Moise Boring Contast  Result Date: 10/25/2017 CLINICAL DATA:  Evaluate liver lesion. EXAM: MRI ABDOMEN WITHOUT AND WITH CONTRAST (INCLUDING MRCP) TECHNIQUE: Multiplanar multisequence MR imaging of the abdomen was performed both before and after  the administration of intravenous contrast. Heavily T2-weighted images of the biliary and pancreatic ducts were obtained, and three-dimensional MRCP images were rendered by post processing. CONTRAST:  10 cc Gadavist COMPARISON:  CT AP 02/24/2017. FINDINGS: Lower chest: Bilateral airspace consolidation identified. Hepatobiliary: The liver is cirrhotic. There is a diffuse nodular contour with hypertrophy of the caudate lobe and lateral segment of left lobe. There has been interval development of multiple suspicious enhancing liver lesions involving both lobes. -index lesion within the anterior aspect of segment 7 measures 3.6 cm, image 30/26. -Index lesion within segment 6 measures 2.4 cm, image 63/26. -index lesion within segment 2 measures 1.3 cm, image 45/28. -index lesion is within segment 4B measures 4.3 cm, image 45/26. There is associated tumor thrombus involving the left branch of the portal vein, image 39/28. Additionally, this mass obstructs the right medial hepatic duct and left main hepatic duct. The right lateral (posterior) duct appears mildly dilated and may be partially obstructed. Mild gallbladder wall edema and pericholecystic fluid. No common bile duct dilatation. Pancreas: No mass, inflammatory changes, or other parenchymal abnormality identified. Spleen:  Splenomegaly. Adrenals/Urinary Tract: New bilateral adrenal lesions compatible with metastatic disease. Right adrenal nodule measures 4 cm, image 49/32. The left adrenal metastasis measures 1.7 cm, image 56/32. Unremarkable appearance of the kidneys. Stomach/Bowel: Visualized portions within the abdomen are unremarkable.  Vascular/Lymphatic: Aortic atherosclerosis. No aneurysm. The main portal vein appears patent. Interval development of retroperitoneal adenopathy. Index right retrocaval node measures 1.7 cm, image 79/28. Small distal esophageal and perigastric varices. Other:  Mild perihepatic ascites. Musculoskeletal: No suspicious bone lesions identified. IMPRESSION: 1. Advanced changes of cirrhosis. Interval development of multifocal enhancing liver lesions, which is concerning for multicentric hepatocellular carcinoma involving both lobes of liver. 2. Lesion within segment 4 B obstructs the right medial hepatic duct and left main duct resulting in moderate to marked biliary ductal dilatation within the left lobe and dome of liver. There may be partial obstruction of the right lateral duct with mild dilatation. 3. Tumor thrombus within the left branch of the portal vein likely secondary to segment 4 B tumor. 4. Bilateral adrenal metastasis and retroperitoneal nodal metastasis. 5. Stigmata of portal venous hypertension including varices, ascites and splenomegaly. 6. These results will be called to the ordering clinician or representative by the Radiologist Assistant, and communication documented in the PACS or zVision Dashboard. Electronically Signed   By: Kerby Moors M.D.   On: 10/25/2017 13:31    Labs:  CBC: Recent Labs    04/02/17 1346 09/05/17 0853 10/23/17 1746 10/24/17 0549 10/25/17 0551  WBC 8.1  --  8.5 7.7 8.2  HGB 15.4 15.3 13.0 12.0* 12.2*  HCT 44.5 45.0 39.5 35.7* 36.9*  PLT 249.0  --  273 204 225    COAGS: Recent Labs    04/02/17 1346 10/23/17 1918 10/25/17 0551  INR 1.2* 1.07 1.15    BMP: Recent Labs    02/25/17 0443 04/02/17 1346 09/05/17 0853 10/23/17 1746 10/24/17 0549 10/25/17 0551  NA 133* 134* 134* 134* 130* 129*  K 5.3* 4.2 4.5 4.2 4.2 4.7  CL 100* 101  --  100 99 96*  CO2 21* 24  --  _0 GLUCOSE 170* 120* 199* 120* 112* 131*  BUN 14 13  --  _1 CALCIUM  8.9 9.5  --  9.6 8.7* 8.8*  CREATININE 0.74 0.77  --  0.55* 0.30* 0.38*  GFRNONAA >60  --   --  >60 >60 >  60  GFRAA >60  --   --  >60 >60 >60    LIVER FUNCTION TESTS: Recent Labs    04/02/17 1346 10/23/17 1746 10/24/17 0549 10/25/17 0551  BILITOT 0.5 16.9* 17.2* 20.4*  AST 29 225* 204* 214*  ALT 39 220* 187* 197*  ALKPHOS 71 778* 682* 786*  PROT 7.8 8.2* 7.3 7.4  ALBUMIN 4.2 3.0* 2.5* 2.4*    TUMOR MARKERS: Recent Labs    04/02/17 1346  AFPTM 2.2    Assessment and Plan:  Advanced changes of cirrhosis. Interval development of multifocal enhancing liver lesions, which is concerning for multicentric hepatocellular carcinoma involving both lobes of liver.  Lesion within segment 4 B obstructs the right medial hepatic duct and left main duct resulting in moderate to marked biliary ductal dilatation within the left lobe and dome of liver.   There may be partial obstruction of the right lateral duct with mild dilatation.  Will proceed with image guided percutaneious transhepatic cholangiogram with drain placement tomorrow by Dr. Earleen Newport.  Risks and benefits of PTC with drain was discussed with the patient including, but not limited to bleeding, infection which may lead to sepsis or even death and damage to adjacent structures.  This interventional procedure involves the use of X-rays and because of the nature of the planned procedure, it is possible that we will have prolonged use of X-ray fluoroscopy.  Potential radiation risks to you include (but are not limited to) the following: - A slightly elevated risk for cancer  several years later in life. This risk is typically less than 0.5% percent. This risk is low in comparison to the normal incidence of human cancer, which is 33% for women and 50% for men according to the [employer]. - Radiation induced injury can include skin redness, resembling a rash, tissue breakdown / ulcers and hair loss (which can be temporary  or permanent).   The likelihood of either of these occurring depends on the difficulty of the procedure and whether you are sensitive to radiation due to previous procedures, disease, or genetic conditions.   IF your procedure requires a prolonged use of radiation, you will be notified and given written instructions for further action.  It is your responsibility to monitor the irradiated area for the 2 weeks following the procedure and to notify your physician if you are concerned that you have suffered a radiation induced injury.    All of the patient's questions were answered, patient is agreeable to proceed.  Consent signed and in chart.  Thank you for this interesting consult.  I greatly enjoyed meeting Matthew Lewis and look forward to participating in their care.  A copy of this report was sent to the requesting provider on this date.  Electronically Signed: Murrell Redden, PA-C   10/25/2017, 2:29 PM      I spent a total of 40 Minutes in face to face in clinical consultation, greater than 50% of which was counseling/coordinating care for PTC.\

## 2017-10-25 NOTE — Progress Notes (Signed)
ANTICOAGULATION CONSULT NOTE   Pharmacy Consult for heparin Indication: portal vein thrombosis  Allergies  Allergen Reactions  . Morphine And Related Other (See Comments)    PATIENT IS RECOVERING FROM DRUG ADDICTION PATIENT WANTS TO AVOID ANY NARCOTICS  . Other Other (See Comments)    PATIENT IS RECOVERING FROM DRUG ADDICTION PATIENT WANTS TO AVOID ANY NARCOTICS    Patient Measurements: Height: 5' 10.5" (179.1 cm) Weight: 227 lb (103 kg) IBW/kg (Calculated) : 74.15 Heparin Dosing Weight: 96 kg  Vital Signs: Temp: 98.4 F (36.9 C) (10/19 2030) Temp Source: Oral (10/19 2030) BP: 98/70 (10/19 2030) Pulse Rate: 95 (10/19 2030)  Labs: Recent Labs    10/23/17 1746 10/23/17 1918 10/24/17 0549 10/25/17 0551 10/25/17 2044  HGB 13.0  --  12.0* 12.2*  --   HCT 39.5  --  35.7* 36.9*  --   PLT 273  --  204 225  --   LABPROT  --  13.8  --  14.6  --   INR  --  1.07  --  1.15  --   HEPARINUNFRC  --   --   --   --  <0.10*  CREATININE 0.55*  --  0.30* 0.38*  --     Estimated Creatinine Clearance: 113.1 mL/min (A) (by C-G formula based on SCr of 0.38 mg/dL (L)).   Medical History: Past Medical History:  Diagnosis Date  . Abscess of anal and rectal regions    horse shoe abscess  . Alcoholic cirrhosis ([place])   . Arthritis    "everywhere; especially in my spine"  . Cirrhosis ([place])    STAGE 1 CIRRHOSIS PATIENT SEES DR NAMDIGAM FOR  . Colon cancer ([place]) 05/2005   TX SURGERY WITH LYMPH NODE REMOVAL  . Cough    LAST FEW WEEKS SAW DR RAMOS LUNGS MILKY SPUTUM OCC  . DM type 2 with diabetic peripheral neuropathy (HCC)    left foot  . Fatigue   . Fistula, anal    multiple  . History of substance abuse ([place])    last alcohol was 08/2011; last marijuania was 08/2011  . Hypertension   . PTSD (post-traumatic stress disorder)    "severe" HX OF  . Sleep deprivation     Assessment: Patient is a 62 y.o M with hx EtOH abuse, cirrhosis and colon cancer presented to the ED on 10/23/17  with jaundice.  LFTS and Tbili elevated on admission. INR wnl. MRCP on 10/19 showed portal vein thrombosis. To start heparin drip on 10/19 for thrombosis.  Today, 10/25/2017: - cbc stable - INR 1.15 - LFTs and Tbili remain elevated - last lovenox 40 mg SQ dose given on 10/18 at 1129. -2128 HL= <0.10 below goal, no bleeding or infusion issues per RN.   Goal of Therapy:  Heparin level 0.3-0.7 units/ml Monitor platelets by anticoagulation protocol: Yes   Plan:  - With risk for bleeding d/t cirrhosis/elevated LFTs, will be conservative - Rebolus with 2500 units IV heparin x1, Increase drip to 1600 units/hr - Recheck 6 hr heparin level and Daily CBC - monitor for s/s bleeding  Dorrene German 10/25/2017,11:08 PM

## 2017-10-26 ENCOUNTER — Encounter (HOSPITAL_COMMUNITY): Payer: Self-pay | Admitting: Interventional Radiology

## 2017-10-26 ENCOUNTER — Inpatient Hospital Stay (HOSPITAL_COMMUNITY): Payer: Medicare Other

## 2017-10-26 DIAGNOSIS — K831 Obstruction of bile duct: Principal | ICD-10-CM

## 2017-10-26 DIAGNOSIS — Z859 Personal history of malignant neoplasm, unspecified: Secondary | ICD-10-CM

## 2017-10-26 HISTORY — PX: IR INT EXT BILIARY DRAIN WITH CHOLANGIOGRAM: IMG6044

## 2017-10-26 LAB — CBC
HCT: 36.1 % — ABNORMAL LOW (ref 39.0–52.0)
HEMOGLOBIN: 11.9 g/dL — AB (ref 13.0–17.0)
MCH: 30.2 pg (ref 26.0–34.0)
MCHC: 33 g/dL (ref 30.0–36.0)
MCV: 91.6 fL (ref 80.0–100.0)
PLATELETS: 198 10*3/uL (ref 150–400)
RBC: 3.94 MIL/uL — ABNORMAL LOW (ref 4.22–5.81)
RDW: 17.7 % — ABNORMAL HIGH (ref 11.5–15.5)
WBC: 6.7 10*3/uL (ref 4.0–10.5)
nRBC: 0 % (ref 0.0–0.2)

## 2017-10-26 LAB — COMPREHENSIVE METABOLIC PANEL
ALK PHOS: 762 U/L — AB (ref 38–126)
ALT: 194 U/L — AB (ref 0–44)
AST: 203 U/L — AB (ref 15–41)
Albumin: 2.3 g/dL — ABNORMAL LOW (ref 3.5–5.0)
Anion gap: 8 (ref 5–15)
BUN: 8 mg/dL (ref 8–23)
CALCIUM: 8.8 mg/dL — AB (ref 8.9–10.3)
CO2: 25 mmol/L (ref 22–32)
CREATININE: 0.42 mg/dL — AB (ref 0.61–1.24)
Chloride: 98 mmol/L (ref 98–111)
GFR calc Af Amer: >60 mL/min (ref >60)
GFR calc non Af Amer: >60 mL/min (ref >60)
Glucose, Bld: 143 mg/dL — ABNORMAL HIGH (ref 70–99)
Potassium: 4.5 mmol/L (ref 3.5–5.1)
SODIUM: 131 mmol/L — AB (ref 135–145)
Total Bilirubin: 19.5 mg/dL (ref 0.3–1.2)
Total Protein: 7.1 g/dL (ref 6.5–8.1)

## 2017-10-26 LAB — GLUCOSE, CAPILLARY
Glucose-Capillary: 155 mg/dL — ABNORMAL HIGH (ref 70–99)
Glucose-Capillary: 144 mg/dL — ABNORMAL HIGH (ref 70–99)
Glucose-Capillary: 188 mg/dL — ABNORMAL HIGH (ref 70–99)
Glucose-Capillary: 175 mg/dL — ABNORMAL HIGH (ref 70–99)

## 2017-10-26 LAB — HEPARIN LEVEL (UNFRACTIONATED): Heparin Unfractionated: <0.1 IU/mL — ABNORMAL LOW (ref 0.30–0.70)

## 2017-10-26 LAB — PROTIME-INR
INR: 1.12
Prothrombin Time: 14.3 seconds (ref 11.4–15.2)

## 2017-10-26 MED ORDER — MIDAZOLAM HCL 2 MG/2ML IJ SOLN
INTRAMUSCULAR | Status: AC
  Filled 2017-10-26: qty 2

## 2017-10-26 MED ORDER — ONDANSETRON HCL 4 MG/2ML IJ SOLN
INTRAMUSCULAR | Status: AC | PRN
  Administered 2017-10-26: 4 mg via INTRAVENOUS

## 2017-10-26 MED ORDER — IBUPROFEN 200 MG PO TABS: 600.0000 mg | ORAL_TABLET | Freq: Once | ORAL | Status: DC

## 2017-10-26 MED ORDER — TRAMADOL HCL 50 MG PO TABS
50.0000 mg | ORAL_TABLET | Freq: Four times a day (QID) | ORAL | Status: DC | PRN
  Administered 2017-10-26: 50 mg via ORAL
  Filled 2017-10-26: qty 1

## 2017-10-26 MED ORDER — FENTANYL CITRATE (PF) 100 MCG/2ML IJ SOLN
INTRAMUSCULAR | Status: AC
  Filled 2017-10-26: qty 2

## 2017-10-26 MED ORDER — HYDROXYZINE HCL 10 MG PO TABS
10.0000 mg | ORAL_TABLET | Freq: Four times a day (QID) | ORAL | Status: DC | PRN
  Filled 2017-10-26: qty 1

## 2017-10-26 MED ORDER — CIPROFLOXACIN IN D5W 400 MG/200ML IV SOLN
INTRAVENOUS | Status: AC
  Administered 2017-10-26: 400 mg via INTRAVENOUS
  Filled 2017-10-26: qty 200

## 2017-10-26 MED ORDER — UNJURY CHICKEN SOUP POWDER
1.0000 | Freq: Three times a day (TID) | ORAL | Status: DC
  Filled 2017-10-26 (×11): qty 27

## 2017-10-26 MED ORDER — IOPAMIDOL (ISOVUE-300) INJECTION 61%
INTRAVENOUS | Status: AC
  Administered 2017-10-26: 20 mL
  Filled 2017-10-26: qty 50

## 2017-10-26 MED ORDER — FENTANYL CITRATE (PF) 100 MCG/2ML IJ SOLN
INTRAMUSCULAR | Status: AC | PRN
  Administered 2017-10-26 (×2): 50 ug via INTRAVENOUS

## 2017-10-26 MED ORDER — HEPARIN (PORCINE) IN NACL 100-0.45 UNIT/ML-% IJ SOLN
1600.0000 [IU]/h | INTRAMUSCULAR | Status: DC
  Administered 2017-10-26: 1600 [IU]/h via INTRAVENOUS
  Filled 2017-10-26: qty 250

## 2017-10-26 MED ORDER — SODIUM CHLORIDE 0.9% FLUSH
5.0000 mL | Freq: Three times a day (TID) | INTRAVENOUS | Status: DC
  Administered 2017-10-26 – 2017-10-29 (×6): 5 mL

## 2017-10-26 MED ORDER — MIDAZOLAM HCL 2 MG/2ML IJ SOLN
INTRAMUSCULAR | Status: AC
  Filled 2017-10-26: qty 4

## 2017-10-26 MED ORDER — MIDAZOLAM HCL 2 MG/2ML IJ SOLN
INTRAMUSCULAR | Status: AC | PRN
  Administered 2017-10-26 (×2): 1 mg via INTRAVENOUS

## 2017-10-26 MED ORDER — LIDOCAINE HCL 1 % IJ SOLN
INTRAMUSCULAR | Status: AC
  Filled 2017-10-26: qty 20

## 2017-10-26 MED ORDER — CIPROFLOXACIN IN D5W 400 MG/200ML IV SOLN
400.0000 mg | INTRAVENOUS | Status: AC
  Administered 2017-10-26: 400 mg via INTRAVENOUS

## 2017-10-26 MED ORDER — ONDANSETRON HCL 4 MG/2ML IJ SOLN
INTRAMUSCULAR | Status: AC
  Filled 2017-10-26: qty 2

## 2017-10-26 MED ORDER — LIDOCAINE HCL 1 % IJ SOLN
INTRAMUSCULAR | Status: AC | PRN
  Administered 2017-10-26: 10 mL

## 2017-10-26 NOTE — Progress Notes (Addendum)
ANTICOAGULATION CONSULT NOTE   Pharmacy Consult for heparin Indication: portal vein thrombosis  Allergies  Allergen Reactions  . Morphine And Related Other (See Comments)    PATIENT IS RECOVERING FROM DRUG ADDICTION PATIENT WANTS TO AVOID ANY NARCOTICS  . Other Other (See Comments)    PATIENT IS RECOVERING FROM DRUG ADDICTION PATIENT WANTS TO AVOID ANY NARCOTICS   Patient Measurements: Height: 5' 10.5" (179.1 cm) Weight: 227 lb (103 kg) IBW/kg (Calculated) : 74.15 Heparin Dosing Weight: 96 kg  Vital Signs: Temp: 98.7 F (37.1 C) (10/20 0429) Temp Source: Oral (10/20 0429) BP: 107/68 (10/20 0429) Pulse Rate: 99 (10/20 0429)  Labs: Recent Labs    10/23/17 1918 10/24/17 0549 10/25/17 0551 10/25/17 2044 10/26/17 0539  HGB  --  12.0* 12.2*  --  11.9*  HCT  --  35.7* 36.9*  --  36.1*  PLT  --  204 225  --  198  LABPROT 13.8  --  14.6  --  14.3  INR 1.07  --  1.15  --  1.12  HEPARINUNFRC  --   --   --  <0.10* <0.10*  CREATININE  --  0.30* 0.38*  --  0.42*   Estimated Creatinine Clearance: 113.1 mL/min (A) (by C-G formula based on SCr of 0.42 mg/dL (L)).  Medical History: Past Medical History:  Diagnosis Date  . Abscess of anal and rectal regions    horse shoe abscess  . Alcoholic cirrhosis ([place])   . Arthritis    "everywhere; especially in my spine"  . Cirrhosis ([place])    STAGE 1 CIRRHOSIS PATIENT SEES DR NAMDIGAM FOR  . Colon cancer ([place]) 05/2005   TX SURGERY WITH LYMPH NODE REMOVAL  . Cough    LAST FEW WEEKS SAW DR RAMOS LUNGS MILKY SPUTUM OCC  . DM type 2 with diabetic peripheral neuropathy ([facility])    left foot  . Fatigue   . Fistula, anal    multiple  . History of substance abuse ([place])    last alcohol was 08/2011; last marijuania was 08/2011  . Hypertension   . PTSD (post-traumatic stress disorder)    "severe" HX OF  . Sleep deprivation    Assessment: Patient is a 62 y.o M with hx EtOH abuse, cirrhosis and colon cancer presented to the ED on 10/23/17 with  jaundice.  LFTS and Tbili elevated on admission. INR wnl. MRCP on 10/19 showed portal vein thrombosis. To start heparin drip on 10/19 for thrombosis.  10/19:  INR 1.15, LFTs and Tbili remain elevated - last lovenox 40 mg SQ dose given on 10/18 at 1129 - Heparin 2500 unit bolus, infusion at 1400 units/hr, conservative dosing with liver dysfunction -2128 HL= <0.10 below goal, no bleeding or infusion issues per RN.   Today, 10/26/2017:  HL at 0539 = < 0.10 units/ml, Pharmacy was unaware that Heparin was to be stopped at 5am. The Nursing care order to stop Heparin per radiology from 10/19 for 3am today "completed" at same time.  Heparin order not changed to reflect stop time.  RN stopped Heparin as ordered  Planned cholangiogram and bile drain placement today per IR  Goal of Therapy:  Heparin level 0.3-0.7 units/ml Monitor platelets by anticoagulation protocol: Yes   Plan:   Heparin off since 5am, await procedure and follow-up orders to resume Heparin  Minda Ditto PharmD Pager [phone] 10/26/2017, 7:15 AM

## 2017-10-26 NOTE — Progress Notes (Signed)
PROGRESS NOTE [facility]   MAXIMILLIANO KERSH   LOV:[id number] DOB: [birth year]  DOA: 10/23/2017 PCP: [name], MD   Brief Narrative:  Matthew Lewis is a 62 year old male with medical history significant for diabetes mellitus type 2, hypertension, alcoholic cirrhosis and history of colon cancer in remission.  Patient presented to the emergency department after being sent from primary care physician for abnormal LFTs.  Patient has been having diarrhea for 2 weeks prior to admission with weight loss and jaundice.  Patient was seen by PCP and started on empiric vancomycin for C. difficile and lab work-up revealed elevated liver enzymes with severely elevated T bili.  Patient admitted with working diagnosis of jaundice. GI was consulted.   Subjective: Patient seen and examined, complaining of abdominal pain however lying in bed comfortable.  Denies nausea and vomiting.  Wants to eat.   Assessment & Plan: Obstructive jaundice  Due to multifocal masses concerning for [facility] and tumor invading the left portal vein portal vein thrombosis.  Mass is obstructing the biliary tree. GI unable to perform ERCP, IR was consulted and planning for percutaneous biliary drain today.  Continue to monitor LFTs and INR.  N.p.o. for now due to surgical procedure.   ? [place] with bilateral adrenal metastases and retroperitoneal nodal metastases Oncology recommendations appreciated. AFP 1.3, checking CEA due to hx of colon CA  [name] will see patient in a.m. for further plan.  Hx of cirrhosis  [place] consistent with cirrhotic pattern, ammonia levels improved, continue low-dose lactulose BID. Management per GI   HTN  BP remains stable continue lisinopril  DM type 2  CBGs stable during hospital stay, continue with amiodarone at reduced dose and SSI.  A1c pending.  Diarrhea - resolved Prior to admission was treated with empiric oral vanc for presumed C-diff. Vancomycin has been stopped, patient  currently denies diarrhea, if recurs will get stool sample. Continue to monitor      DVT prophylaxis: Lovenox Code Status: Full code Family Communication: Family at bedside Disposition Plan: TBD  Consultants:   GI  Procedures:   None   Antimicrobials:  None     Objective: Vitals:   10/26/17 0429 10/26/17 1249 10/26/17 1309 10/26/17 1314  BP: 107/68 125/81 118/78 114/75  Pulse: 99 95 96 97  Resp: 18 14 12 17   Temp: 98.7 F (37.1 C)     TempSrc: Oral     SpO2: 98% 95% 98% 98%  Weight:      Height:        Intake/Output Summary (Last 24 hours) at 10/26/2017 1320 Last data filed at 10/26/2017 0900 Gross per 24 hour  Intake 534.96 ml  Output -  Net 534.96 ml   Filed Weights   10/23/17 1707  Weight: 103 kg    Examination:  General: Pt is alert, awake, not in acute distress HEENT: Scleral icterus 3+ Cardiovascular: RRR, S1/S2 +, no rubs, no gallops Respiratory: CTA bilaterally, no wheezing, no rhonchi Abdominal: Soft diffuse tenderness Extremities: no edema, no cyanosis Skin: jaundice  Data Reviewed: I have personally reviewed following labs and imaging studies  CBC: Recent Labs  Lab 10/23/17 1746 10/24/17 0549 10/25/17 0551 10/26/17 0539  WBC 8.5 7.7 8.2 6.7  NEUTROABS 6.4  --   --   --   HGB 13.0 12.0* 12.2* 11.9*  HCT 39.5 35.7* 36.9* 36.1*  MCV 91.6 92.2 91.8 91.6  PLT 273 204 225 884   Basic Metabolic Panel: Recent Labs  Lab 10/23/17 1746 10/24/17  9357 10/25/17 0551 10/26/17 0539  NA 134* 130* 129* 131*  K 4.2 4.2 4.7 4.5  CL 100 99 96* 98  CO2 22 23 25 25   GLUCOSE 120* 112* 131* 143*  BUN 13 10 9 8   CREATININE 0.55* 0.30* 0.38* 0.42*  CALCIUM 9.6 8.7* 8.8* 8.8*   GFR: Estimated Creatinine Clearance: 113.1 mL/min (A) (by C-G formula based on SCr of 0.42 mg/dL (L)). Liver Function Tests: Recent Labs  Lab 10/23/17 1746 10/24/17 0549 10/25/17 0551 10/26/17 0539  AST 225* 204* 214* 203*  ALT 220* 187* 197* 194*  ALKPHOS 778*  682* 786* 762*  BILITOT 16.9* 17.2* 20.4* 19.5*  PROT 8.2* 7.3 7.4 7.1  ALBUMIN 3.0* 2.5* 2.4* 2.3*   Recent Labs  Lab 10/23/17 1746  LIPASE 27   Recent Labs  Lab 10/23/17 1918 10/25/17 0551  AMMONIA 47* 32   Coagulation Profile: Recent Labs  Lab 10/23/17 1918 10/25/17 0551 10/26/17 0539  INR 1.07 1.15 1.12   Cardiac Enzymes: No results for input(s): CKTOTAL, CKMB, CKMBINDEX, TROPONINI in the last 168 hours. BNP (last 3 results) No results for input(s): PROBNP in the last 8760 hours. HbA1C: No results for input(s): HGBA1C in the last 72 hours. CBG: Recent Labs  Lab 10/25/17 1050 10/25/17 1258 10/25/17 1649 10/25/17 2028 10/26/17 0756  GLUCAP 130* 127* 227* 110* 155*   Lipid Profile: No results for input(s): CHOL, HDL, LDLCALC, TRIG, CHOLHDL, LDLDIRECT in the last 72 hours. Thyroid Function Tests: No results for input(s): TSH, T4TOTAL, FREET4, T3FREE, THYROIDAB in the last 72 hours. Anemia Panel: No results for input(s): VITAMINB12, FOLATE, FERRITIN, TIBC, IRON, RETICCTPCT in the last 72 hours. Sepsis Labs: No results for input(s): PROCALCITON, LATICACIDVEN in the last 168 hours.  No results found for this or any previous visit (from the past 240 hour(s)).    Radiology Studies: Mr 3d Recon At Scanner  Result Date: 10/25/2017 CLINICAL DATA:  Evaluate liver lesion. EXAM: MRI ABDOMEN WITHOUT AND WITH CONTRAST (INCLUDING MRCP) TECHNIQUE: Multiplanar multisequence MR imaging of the abdomen was performed both before and after the administration of intravenous contrast. Heavily T2-weighted images of the biliary and pancreatic ducts were obtained, and three-dimensional MRCP images were rendered by post processing. CONTRAST:  10 cc Gadavist COMPARISON:  CT AP 02/24/2017. FINDINGS: Lower chest: Bilateral airspace consolidation identified. Hepatobiliary: The liver is cirrhotic. There is a diffuse nodular contour with hypertrophy of the caudate lobe and lateral segment of  left lobe. There has been interval development of multiple suspicious enhancing liver lesions involving both lobes. -index lesion within the anterior aspect of segment 7 measures 3.6 cm, image 30/26. -Index lesion within segment 6 measures 2.4 cm, image 63/26. -index lesion within segment 2 measures 1.3 cm, image 45/28. -index lesion is within segment 4B measures 4.3 cm, image 45/26. There is associated tumor thrombus involving the left branch of the portal vein, image 39/28. Additionally, this mass obstructs the right medial hepatic duct and left main hepatic duct. The right lateral (posterior) duct appears mildly dilated and may be partially obstructed. Mild gallbladder wall edema and pericholecystic fluid. No common bile duct dilatation. Pancreas: No mass, inflammatory changes, or other parenchymal abnormality identified. Spleen:  Splenomegaly. Adrenals/Urinary Tract: New bilateral adrenal lesions compatible with metastatic disease. Right adrenal nodule measures 4 cm, image 49/32. The left adrenal metastasis measures 1.7 cm, image 56/32. Unremarkable appearance of the kidneys. Stomach/Bowel: Visualized portions within the abdomen are unremarkable. Vascular/Lymphatic: Aortic atherosclerosis. No aneurysm. The main portal vein appears patent. Interval  development of retroperitoneal adenopathy. Index right retrocaval node measures 1.7 cm, image 79/28. Small distal esophageal and perigastric varices. Other:  Mild perihepatic ascites. Musculoskeletal: No suspicious bone lesions identified. IMPRESSION: 1. Advanced changes of cirrhosis. Interval development of multifocal enhancing liver lesions, which is concerning for multicentric hepatocellular carcinoma involving both lobes of liver. 2. Lesion within segment 4 B obstructs the right medial hepatic duct and left main duct resulting in moderate to marked biliary ductal dilatation within the left lobe and dome of liver. There may be partial obstruction of the right  lateral duct with mild dilatation. 3. Tumor thrombus within the left branch of the portal vein likely secondary to segment 4 B tumor. 4. Bilateral adrenal metastasis and retroperitoneal nodal metastasis. 5. Stigmata of portal venous hypertension including varices, ascites and splenomegaly. 6. These results will be called to the ordering clinician or representative by the Radiologist Assistant, and communication documented in the PACS or zVision Dashboard. Electronically Signed   By: Kerby Moors M.D.   On: 10/25/2017 13:31   Mr Abdomen Mrcp Moise Boring Contast  Result Date: 10/25/2017 CLINICAL DATA:  Evaluate liver lesion. EXAM: MRI ABDOMEN WITHOUT AND WITH CONTRAST (INCLUDING MRCP) TECHNIQUE: Multiplanar multisequence MR imaging of the abdomen was performed both before and after the administration of intravenous contrast. Heavily T2-weighted images of the biliary and pancreatic ducts were obtained, and three-dimensional MRCP images were rendered by post processing. CONTRAST:  10 cc Gadavist COMPARISON:  CT AP 02/24/2017. FINDINGS: Lower chest: Bilateral airspace consolidation identified. Hepatobiliary: The liver is cirrhotic. There is a diffuse nodular contour with hypertrophy of the caudate lobe and lateral segment of left lobe. There has been interval development of multiple suspicious enhancing liver lesions involving both lobes. -index lesion within the anterior aspect of segment 7 measures 3.6 cm, image 30/26. -Index lesion within segment 6 measures 2.4 cm, image 63/26. -index lesion within segment 2 measures 1.3 cm, image 45/28. -index lesion is within segment 4B measures 4.3 cm, image 45/26. There is associated tumor thrombus involving the left branch of the portal vein, image 39/28. Additionally, this mass obstructs the right medial hepatic duct and left main hepatic duct. The right lateral (posterior) duct appears mildly dilated and may be partially obstructed. Mild gallbladder wall edema and  pericholecystic fluid. No common bile duct dilatation. Pancreas: No mass, inflammatory changes, or other parenchymal abnormality identified. Spleen:  Splenomegaly. Adrenals/Urinary Tract: New bilateral adrenal lesions compatible with metastatic disease. Right adrenal nodule measures 4 cm, image 49/32. The left adrenal metastasis measures 1.7 cm, image 56/32. Unremarkable appearance of the kidneys. Stomach/Bowel: Visualized portions within the abdomen are unremarkable. Vascular/Lymphatic: Aortic atherosclerosis. No aneurysm. The main portal vein appears patent. Interval development of retroperitoneal adenopathy. Index right retrocaval node measures 1.7 cm, image 79/28. Small distal esophageal and perigastric varices. Other:  Mild perihepatic ascites. Musculoskeletal: No suspicious bone lesions identified. IMPRESSION: 1. Advanced changes of cirrhosis. Interval development of multifocal enhancing liver lesions, which is concerning for multicentric hepatocellular carcinoma involving both lobes of liver. 2. Lesion within segment 4 B obstructs the right medial hepatic duct and left main duct resulting in moderate to marked biliary ductal dilatation within the left lobe and dome of liver. There may be partial obstruction of the right lateral duct with mild dilatation. 3. Tumor thrombus within the left branch of the portal vein likely secondary to segment 4 B tumor. 4. Bilateral adrenal metastasis and retroperitoneal nodal metastasis. 5. Stigmata of portal venous hypertension including varices, ascites and splenomegaly. 6.  These results will be called to the ordering clinician or representative by the Radiologist Assistant, and communication documented in the PACS or zVision Dashboard. Electronically Signed   By: Kerby Moors M.D.   On: 10/25/2017 13:31     Scheduled Meds: . feeding supplement  1 Container Oral TID BM  . fentaNYL      . gabapentin  400 mg Oral Daily  . gabapentin  800 mg Oral QHS  . insulin  aspart  0-9 Units Subcutaneous TID WC & HS  . insulin detemir  41 Units Subcutaneous Daily  . insulin detemir  6 Units Subcutaneous QHS  . iopamidol      . lactulose  10 g Oral BID  . lidocaine      . lisinopril  20 mg Oral Daily  . midazolam      . multivitamin with minerals  1 tablet Oral Daily  . protein supplement  1 packet Oral TID   Continuous Infusions: . ciprofloxacin 400 mg (10/26/17 1246)     LOS: 3 days   Time spent: Total of 15 minutes spent with pt, greater than 50% of which was spent in discussion of  treatment, counseling and coordination of care   Chipper Oman, MD Pager: Text Page via [email or web address]   If 7PM-7AM, please contact night-coverage [email or web address] 10/26/2017, 1:20 PM   Note - This record has been created using [employer]. Chart creation errors have been sought, but may not always have been located. Such creation errors do not reflect on the standard of medical care.

## 2017-10-26 NOTE — Progress Notes (Signed)
CRITICAL VALUE ALERT  Critical Value: T bilirubin 19.5  Date & Time Notied:  10/26/17  Provider Notified: Dr.Silva via Amion   Orders Received/Actions taken: waiting and continue to monitor.

## 2017-10-26 NOTE — Progress Notes (Signed)
Progress Note   Subjective  Chief Complaint: Multifocal HCC causing biliary obstruction  Today, the patient tells me that he is really hoping that his procedure can be today as he was told yesterday that this may not be a possibility.  He is feeling a little bit down today and is having some right upper quadrant pain but is ready for the procedure.   Objective   Vital signs in last 24 hours: Temp:  [97.8 F (36.6 C)-98.7 F (37.1 C)] 98.7 F (37.1 C) (10/20 0429) Pulse Rate:  [92-102] 99 (10/20 0429) Resp:  [13-20] 18 (10/20 0429) BP: (85-113)/(58-76) 107/68 (10/20 0429) SpO2:  [94 %-98 %] 98 % (10/20 0429) Last BM Date: 10/26/17 General:   Jaundiced white male in NAD Heart:  Regular rate and rhythm; no murmurs Lungs: Respirations even and unlabored, lungs CTA bilaterally Abdomen:  Soft, mild RUQ ttp and nondistended. Normal bowel sounds. Extremities:  Without edema. Neurologic:  Alert and oriented,  grossly normal neurologically. Psych:  Cooperative. Normal mood and affect.  Intake/Output from previous day: 10/19 0701 - 10/20 0700 In: 565 [P.O.:418.7; I.V.:146.3] Out: -   Lab Results: Recent Labs    10/24/17 0549 10/25/17 0551 10/26/17 0539  WBC 7.7 8.2 6.7  HGB 12.0* 12.2* 11.9*  HCT 35.7* 36.9* 36.1*  PLT 204 225 198   BMET Recent Labs    10/24/17 0549 10/25/17 0551 10/26/17 0539  NA 130* 129* 131*  K 4.2 4.7 4.5  CL 99 96* 98  CO2 23 25 25   GLUCOSE 112* 131* 143*  BUN 10 9 8   CREATININE 0.30* 0.38* 0.42*  CALCIUM 8.7* 8.8* 8.8*   LFT Recent Labs    10/25/17 0551 10/26/17 0539  PROT 7.4 7.1  ALBUMIN 2.4* 2.3*  AST 214* 203*  ALT 197* 194*  ALKPHOS 786* 762*  BILITOT 20.4* 19.5*  BILIDIR 13.4*  --   IBILI 7.0*  --    PT/INR Recent Labs    10/25/17 0551 10/26/17 0539  LABPROT 14.6 14.3  INR 1.15 1.12    Studies/Results: Mr 3d Recon At Scanner  Result Date: 10/25/2017 CLINICAL DATA:  Evaluate liver lesion. EXAM: MRI ABDOMEN  WITHOUT AND WITH CONTRAST (INCLUDING MRCP) TECHNIQUE: Multiplanar multisequence MR imaging of the abdomen was performed both before and after the administration of intravenous contrast. Heavily T2-weighted images of the biliary and pancreatic ducts were obtained, and three-dimensional MRCP images were rendered by post processing. CONTRAST:  10 cc Gadavist COMPARISON:  CT AP 02/24/2017. FINDINGS: Lower chest: Bilateral airspace consolidation identified. Hepatobiliary: The liver is cirrhotic. There is a diffuse nodular contour with hypertrophy of the caudate lobe and lateral segment of left lobe. There has been interval development of multiple suspicious enhancing liver lesions involving both lobes. -index lesion within the anterior aspect of segment 7 measures 3.6 cm, image 30/26. -Index lesion within segment 6 measures 2.4 cm, image 63/26. -index lesion within segment 2 measures 1.3 cm, image 45/28. -index lesion is within segment 4B measures 4.3 cm, image 45/26. There is associated tumor thrombus involving the left branch of the portal vein, image 39/28. Additionally, this mass obstructs the right medial hepatic duct and left main hepatic duct. The right lateral (posterior) duct appears mildly dilated and may be partially obstructed. Mild gallbladder wall edema and pericholecystic fluid. No common bile duct dilatation. Pancreas: No mass, inflammatory changes, or other parenchymal abnormality identified. Spleen:  Splenomegaly. Adrenals/Urinary Tract: New bilateral adrenal lesions compatible with metastatic disease. Right adrenal nodule measures  4 cm, image 49/32. The left adrenal metastasis measures 1.7 cm, image 56/32. Unremarkable appearance of the kidneys. Stomach/Bowel: Visualized portions within the abdomen are unremarkable. Vascular/Lymphatic: Aortic atherosclerosis. No aneurysm. The main portal vein appears patent. Interval development of retroperitoneal adenopathy. Index right retrocaval node measures 1.7  cm, image 79/28. Small distal esophageal and perigastric varices. Other:  Mild perihepatic ascites. Musculoskeletal: No suspicious bone lesions identified. IMPRESSION: 1. Advanced changes of cirrhosis. Interval development of multifocal enhancing liver lesions, which is concerning for multicentric hepatocellular carcinoma involving both lobes of liver. 2. Lesion within segment 4 B obstructs the right medial hepatic duct and left main duct resulting in moderate to marked biliary ductal dilatation within the left lobe and dome of liver. There may be partial obstruction of the right lateral duct with mild dilatation. 3. Tumor thrombus within the left branch of the portal vein likely secondary to segment 4 B tumor. 4. Bilateral adrenal metastasis and retroperitoneal nodal metastasis. 5. Stigmata of portal venous hypertension including varices, ascites and splenomegaly. 6. These results will be called to the ordering clinician or representative by the Radiologist Assistant, and communication documented in the PACS or zVision Dashboard. Electronically Signed   By: Kerby Moors M.D.   On: 10/25/2017 13:31   Mr Abdomen Mrcp Moise Boring Contast  Result Date: 10/25/2017 CLINICAL DATA:  Evaluate liver lesion. EXAM: MRI ABDOMEN WITHOUT AND WITH CONTRAST (INCLUDING MRCP) TECHNIQUE: Multiplanar multisequence MR imaging of the abdomen was performed both before and after the administration of intravenous contrast. Heavily T2-weighted images of the biliary and pancreatic ducts were obtained, and three-dimensional MRCP images were rendered by post processing. CONTRAST:  10 cc Gadavist COMPARISON:  CT AP 02/24/2017. FINDINGS: Lower chest: Bilateral airspace consolidation identified. Hepatobiliary: The liver is cirrhotic. There is a diffuse nodular contour with hypertrophy of the caudate lobe and lateral segment of left lobe. There has been interval development of multiple suspicious enhancing liver lesions involving both lobes. -index  lesion within the anterior aspect of segment 7 measures 3.6 cm, image 30/26. -Index lesion within segment 6 measures 2.4 cm, image 63/26. -index lesion within segment 2 measures 1.3 cm, image 45/28. -index lesion is within segment 4B measures 4.3 cm, image 45/26. There is associated tumor thrombus involving the left branch of the portal vein, image 39/28. Additionally, this mass obstructs the right medial hepatic duct and left main hepatic duct. The right lateral (posterior) duct appears mildly dilated and may be partially obstructed. Mild gallbladder wall edema and pericholecystic fluid. No common bile duct dilatation. Pancreas: No mass, inflammatory changes, or other parenchymal abnormality identified. Spleen:  Splenomegaly. Adrenals/Urinary Tract: New bilateral adrenal lesions compatible with metastatic disease. Right adrenal nodule measures 4 cm, image 49/32. The left adrenal metastasis measures 1.7 cm, image 56/32. Unremarkable appearance of the kidneys. Stomach/Bowel: Visualized portions within the abdomen are unremarkable. Vascular/Lymphatic: Aortic atherosclerosis. No aneurysm. The main portal vein appears patent. Interval development of retroperitoneal adenopathy. Index right retrocaval node measures 1.7 cm, image 79/28. Small distal esophageal and perigastric varices. Other:  Mild perihepatic ascites. Musculoskeletal: No suspicious bone lesions identified. IMPRESSION: 1. Advanced changes of cirrhosis. Interval development of multifocal enhancing liver lesions, which is concerning for multicentric hepatocellular carcinoma involving both lobes of liver. 2. Lesion within segment 4 B obstructs the right medial hepatic duct and left main duct resulting in moderate to marked biliary ductal dilatation within the left lobe and dome of liver. There may be partial obstruction of the right lateral duct with mild dilatation.  3. Tumor thrombus within the left branch of the portal vein likely secondary to segment 4 B  tumor. 4. Bilateral adrenal metastasis and retroperitoneal nodal metastasis. 5. Stigmata of portal venous hypertension including varices, ascites and splenomegaly. 6. These results will be called to the ordering clinician or representative by the Radiologist Assistant, and communication documented in the PACS or zVision Dashboard. Electronically Signed   By: Kerby Moors M.D.   On: 10/25/2017 13:31    Assessment / Plan:   Assessment: 1.  Multifocal HCC causing biliary obstruction; tumor causing portal vein thrombosis: MRI yesterday as above, this was discussed with the patient as well as interventional radiology, plan is for IR percutaneous biliary drain(s), IV heparin drip was started yesterday  Plan: 1.  Appreciate interventional radiology's recommendations 2.  Patient will need an oncology consult 3.  Patient to remain n.p.o. until after time procedure today 4.  Please await further recommendations from Dr. Hilarie Fredrickson  Thank you for your kind consultation, we will continue to follow along.   LOS: 3 days   Levin Erp  10/26/2017, 9:07 AM

## 2017-10-26 NOTE — Progress Notes (Signed)
MEDICATION-RELATED CONSULT NOTE   IR Procedure Consult - Anticoagulant/Antiplatelet PTA/Inpatient Med List Review by Pharmacist    Procedure: IR int ext biliary drain with cholangiogram    Completed: 2217  Post-Procedural bleeding risk per IR MD assessment:  standard  Antithrombotic medications on inpatient or PTA profile prior to procedure:     heparin drip  Recommended restart time per IR Post-Procedure Guidelines:   6 hours  Other considerations:      Plan:       At 2000 resume heparin drip at 1600 units/hr, no bolus  Heparin level in 6 hours  Dolly Rias RPh 10/26/2017, 1:50 PM Pager [phone]

## 2017-10-26 NOTE — Progress Notes (Signed)
HEMATOLOGY-ONCOLOGY PROGRESS NOTE  SUBJECTIVE: Obstructive jaundice with MRI suggestive of metastatic hepatocellular carcinoma Matthew Lewis is a 62 year old who has a prior history of colon cancer in 2007 most likely stage II he had a descending hemicolectomy and did not require any chemotherapy.  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 since 2013 he has been free of alcohol and drugs. He presents with a 2-week history of abdominal pain and yellowing of skin.  His primary care physician performed blood work and asked him to go to the emergency room from where he was admitted.  He had an MRI of the liver which showed changes related to cirrhosis as well as interval development of multifocal enhancing liver lesions concerning for multicentric hepatocellular carcinoma involving both lobes of liver.  In addition there was obstruction of the biliary ducts and tumor thrombus in the left portal vein.  There was also bilateral adrenal metastases and retroperitoneal nodal metastases.  He does have evidence of portal hypertension with varices ascites and splenomegaly.  Today he is expecting to get the percutaneous biliary drain.  OBJECTIVE: REVIEW OF SYSTEMS:   Constitutional: Denies fevers, chills or abnormal weight loss Eyes: Denies blurriness of vision Ears, nose, mouth, throat, and face: Denies mucositis or sore throat Respiratory: Denies cough, dyspnea or wheezes Cardiovascular: Denies palpitation, chest discomfort Gastrointestinal: Abdominal discomfort Skin: Jaundice Lymphatics: Denies new lymphadenopathy or easy bruising Neurological:Denies numbness, tingling or new weaknesses Behavioral/Psych: Mood is stable, no new changes  Extremities: No lower extremity edema   All other systems were reviewed with the patient and are negative.  I have reviewed the past medical history, past surgical history, social history and family  history with the patient and they are unchanged from previous note.   PHYSICAL EXAMINATION: ECOG PERFORMANCE STATUS: 1 - Symptomatic but completely ambulatory  Vitals:   10/25/17 2030 10/26/17 0429  BP: 98/70 107/68  Pulse: 95 99  Resp: 20 18  Temp: 98.4 F (36.9 C) 98.7 F (37.1 C)  SpO2: 95% 98%   Filed Weights   10/23/17 1707  Weight: 227 lb (103 kg)    GENERAL:alert, no distress and comfortable SKIN: Jaundice EYES: normal, Conjunctiva are pink and non-injected, sclera clear OROPHARYNX:no exudate, no erythema and lips, buccal mucosa, and tongue normal  NECK: supple, thyroid normal size, non-tender, without nodularity LYMPH:  no palpable lymphadenopathy in the cervical, axillary or inguinal LUNGS: clear to auscultation and percussion with normal breathing effort HEART: regular rate & rhythm and no murmurs and no lower extremity edema ABDOMEN: Tender, no palpable hepatomegaly or splenomegaly.  No clinical ascites.  Although MRI does show some ascites and splenomegaly. Musculoskeletal:no cyanosis of digits and no clubbing  NEURO: alert & oriented x 3 with fluent speech, no focal motor/sensory deficits  LABORATORY DATA:  I have reviewed the data as listed CMP Latest Ref Rng & Units 10/26/2017 10/25/2017 10/24/2017  Glucose 70 - 99 mg/dL 143(H) 131(H) 112(H)  BUN 8 - 23 mg/dL 8 9 10   Creatinine 0.61 - 1.24 mg/dL 0.42(L) 0.38(L) 0.30(L)  Sodium 135 - 145 mmol/L 131(L) 129(L) 130(L)  Potassium 3.5 - 5.1 mmol/L 4.5 4.7 4.2  Chloride 98 - 111 mmol/L 98 96(L) 99  CO2 22 - 32 mmol/L 25 25 23   Calcium 8.9 - 10.3 mg/dL 8.8(L) 8.8(L) 8.7(L)  Total Protein 6.5 - 8.1 g/dL 7.1 7.4 7.3  Total Bilirubin 0.3 - 1.2 mg/dL 19.5(HH) 20.4(HH) 17.2(H)  Alkaline  Phos 38 - 126 U/L 762(H) 786(H) 682(H)  AST 15 - 41 U/L 203(H) 214(H) 204(H)  ALT 0 - 44 U/L 194(H) 197(H) 187(H)    Lab Results  Component Value Date   WBC 6.7 10/26/2017   HGB 11.9 (L) 10/26/2017   HCT 36.1 (L) 10/26/2017    MCV 91.6 10/26/2017   PLT 198 10/26/2017   NEUTROABS 6.4 10/23/2017    ASSESSMENT AND PLAN: 1.  Obstructive jaundice with MRI findings suspicious for multifocal hepatocellular carcinoma with bilateral adrenal and retroperitoneal metastases However his alpha-fetoprotein is only 1.3. I explained to the patient that [facility] can sometimes present without alpha-fetoprotein as well. However given his history of colon cancer, I would like to run CE[facility]. I will request Dr.Feng to follow him tomorrow for [facility] care because she is the GI oncologist who specializes in hepatocellular carcinomas. She can determine if he would need a liver biopsy.  2. based on his current numbers, he is Child Pugh B I discussed with him that if it is hepatocellular carcinoma, it is not resectable and that his treatment would be with oral therapies like sorafenib. The final treatment plan will be determined by [name] and the gastroenterology team.

## 2017-10-26 NOTE — Procedures (Signed)
Interventional Radiology Procedure Note  Procedure: Image guided right approach PTC with 44F int/ext drain into the duodenum.  The left was not addressed at this time, as the patient became nauseated.   Complications: None  Recommendations:  - To gravity drainage - Observe for rigors - If the t bili does not reduce expectantly, left system may need to be addressed.  Signed,  Dulcy Fanny. Earleen Newport, DO

## 2017-10-27 ENCOUNTER — Inpatient Hospital Stay (HOSPITAL_COMMUNITY): Payer: Medicare Other

## 2017-10-27 ENCOUNTER — Ambulatory Visit (HOSPITAL_COMMUNITY): Admit: 2017-10-27 | Payer: Medicare Other

## 2017-10-27 ENCOUNTER — Encounter (HOSPITAL_COMMUNITY): Payer: Self-pay | Admitting: Registered Nurse

## 2017-10-27 ENCOUNTER — Ambulatory Visit (HOSPITAL_COMMUNITY): Admission: RE | Admit: 2017-10-27 | Payer: Medicare Other | Source: Ambulatory Visit | Admitting: Family Medicine

## 2017-10-27 ENCOUNTER — Encounter (HOSPITAL_COMMUNITY)
Admission: EM | Disposition: A | Discharge status: Home / Self Care | Payer: Self-pay | Source: Home / Self Care | Attending: Family Medicine

## 2017-10-27 ENCOUNTER — Encounter (HOSPITAL_COMMUNITY): Payer: Self-pay | Admitting: Radiology

## 2017-10-27 DIAGNOSIS — R160 Hepatomegaly, not elsewhere classified: Secondary | ICD-10-CM

## 2017-10-27 DIAGNOSIS — C7970 Secondary malignant neoplasm of unspecified adrenal gland: Secondary | ICD-10-CM

## 2017-10-27 DIAGNOSIS — I81 Portal vein thrombosis: Secondary | ICD-10-CM

## 2017-10-27 DIAGNOSIS — I10 Essential (primary) hypertension: Secondary | ICD-10-CM

## 2017-10-27 DIAGNOSIS — E119 Type 2 diabetes mellitus without complications: Secondary | ICD-10-CM

## 2017-10-27 DIAGNOSIS — K7460 Unspecified cirrhosis of liver: Secondary | ICD-10-CM

## 2017-10-27 LAB — CBC
HCT: 34.8 % — ABNORMAL LOW (ref 39.0–52.0)
Hemoglobin: 11.6 g/dL — ABNORMAL LOW (ref 13.0–17.0)
MCH: 30.4 pg (ref 26.0–34.0)
MCHC: 33.3 g/dL (ref 30.0–36.0)
MCV: 91.1 fL (ref 80.0–100.0)
PLATELETS: 231 10*3/uL (ref 150–400)
RBC: 3.82 MIL/uL — AB (ref 4.22–5.81)
RDW: 18.2 % — AB (ref 11.5–15.5)
WBC: 7.6 10*3/uL (ref 4.0–10.5)
nRBC: 0 % (ref 0.0–0.2)

## 2017-10-27 LAB — HEPATIC FUNCTION PANEL
ALT: 166 U/L — ABNORMAL HIGH (ref 0–44)
AST: 134 U/L — AB (ref 15–41)
Albumin: 2.3 g/dL — ABNORMAL LOW (ref 3.5–5.0)
Alkaline Phosphatase: 721 U/L — ABNORMAL HIGH (ref 38–126)
BILIRUBIN INDIRECT: 4.9 mg/dL — AB (ref 0.3–0.9)
Bilirubin, Direct: 7.7 mg/dL — ABNORMAL HIGH (ref 0.0–0.2)
TOTAL PROTEIN: 7.4 g/dL (ref 6.5–8.1)
Total Bilirubin: 12.6 mg/dL — ABNORMAL HIGH (ref 0.3–1.2)

## 2017-10-27 LAB — AMMONIA: AMMONIA: 44 umol/L — AB (ref 9–35)

## 2017-10-27 LAB — GLUCOSE, CAPILLARY
GLUCOSE-CAPILLARY: 114 mg/dL — AB (ref 70–99)
GLUCOSE-CAPILLARY: 179 mg/dL — AB (ref 70–99)
GLUCOSE-CAPILLARY: 168 mg/dL — AB (ref 70–99)
Glucose-Capillary: 178 mg/dL — ABNORMAL HIGH (ref 70–99)

## 2017-10-27 LAB — BASIC METABOLIC PANEL
ANION GAP: 8 (ref 5–15)
BUN: 8 mg/dL (ref 8–23)
CHLORIDE: 95 mmol/L — AB (ref 98–111)
CO2: 24 mmol/L (ref 22–32)
Calcium: 8.6 mg/dL — ABNORMAL LOW (ref 8.9–10.3)
Creatinine, Ser: 0.39 mg/dL — ABNORMAL LOW (ref 0.61–1.24)
GFR calc non Af Amer: >60 mL/min (ref >60)
Glucose, Bld: 121 mg/dL — ABNORMAL HIGH (ref 70–99)
Potassium: 3.9 mmol/L (ref 3.5–5.1)
SODIUM: 127 mmol/L — AB (ref 135–145)

## 2017-10-27 LAB — MAGNESIUM: MAGNESIUM: 2.2 mg/dL (ref 1.7–2.4)

## 2017-10-27 LAB — HEPARIN LEVEL (UNFRACTIONATED)
HEPARIN UNFRACTIONATED: 0.23 [IU]/mL — AB (ref 0.30–0.70)
Heparin Unfractionated: 0.1 IU/mL — ABNORMAL LOW (ref 0.30–0.70)
Heparin Unfractionated: <0.1 IU/mL — ABNORMAL LOW (ref 0.30–0.70)

## 2017-10-27 LAB — CEA: CEA1: 1166 ng/mL — AB (ref 0.0–4.7)

## 2017-10-27 SURGERY — MRI WITH ANESTHESIA
Anesthesia: General

## 2017-10-27 MED ORDER — HEPARIN (PORCINE) IN NACL 100-0.45 UNIT/ML-% IJ SOLN
2200.0000 [IU]/h | INTRAMUSCULAR | Status: DC
  Administered 2017-10-27 (×2): 1800 [IU]/h via INTRAVENOUS
  Filled 2017-10-27 (×4): qty 250

## 2017-10-27 MED ORDER — HYDROCODONE-ACETAMINOPHEN 5-325 MG PO TABS
1.0000 | ORAL_TABLET | Freq: Four times a day (QID) | ORAL | Status: DC | PRN
  Administered 2017-10-27 – 2017-10-28 (×5): 2 via ORAL
  Filled 2017-10-27: qty 1
  Filled 2017-10-27 (×3): qty 2
  Filled 2017-10-27: qty 1
  Filled 2017-10-27: qty 2

## 2017-10-27 MED ORDER — HEPARIN (PORCINE) IN NACL 100-0.45 UNIT/ML-% IJ SOLN
2400.0000 [IU]/h | INTRAMUSCULAR | Status: DC
  Administered 2017-10-28: 2400 [IU]/h via INTRAVENOUS
  Filled 2017-10-27 (×2): qty 250

## 2017-10-27 MED ORDER — IOPAMIDOL (ISOVUE-300) INJECTION 61%
100.0000 mL | Freq: Once | INTRAVENOUS | Status: AC | PRN
  Administered 2017-10-27: 100 mL via INTRAVENOUS

## 2017-10-27 MED ORDER — SODIUM CHLORIDE 0.9 % IJ SOLN
INTRAMUSCULAR | Status: AC
  Administered 2017-10-27: 5 mL
  Filled 2017-10-27: qty 50

## 2017-10-27 MED ORDER — IOHEXOL 300 MG/ML  SOLN
30.0000 mL | Freq: Once | INTRAMUSCULAR | Status: AC | PRN
  Administered 2017-10-27: 30 mL via ORAL

## 2017-10-27 MED ORDER — HEPARIN (PORCINE) IN NACL 100-0.45 UNIT/ML-% IJ SOLN
2400.0000 [IU]/h | INTRAMUSCULAR | Status: DC
  Filled 2017-10-27 (×2): qty 250

## 2017-10-27 MED ORDER — IOPAMIDOL (ISOVUE-300) INJECTION 61%
INTRAVENOUS | Status: AC
  Filled 2017-10-27: qty 100

## 2017-10-27 MED FILL — Propofol IV Emul 200 MG/20ML (10 MG/ML): INTRAVENOUS | Qty: 20 | Status: AC

## 2017-10-27 MED FILL — Lidocaine HCl(Cardiac) IV PF Soln Pref Syr 100 MG/5ML (2%): INTRAVENOUS | Qty: 5 | Status: AC

## 2017-10-27 MED FILL — Phenylephrine-NaCl IV Solution 10 MG/250ML-0.9%: INTRAVENOUS | Qty: 250 | Status: AC

## 2017-10-27 MED FILL — Succinylcholine Chloride Sol Pref Syr 200 MG/10ML (20 MG/ML): INTRAVENOUS | Qty: 10 | Status: AC

## 2017-10-27 MED FILL — Lactated Ringer's Solution: INTRAVENOUS | Qty: 1000 | Status: AC

## 2017-10-27 MED FILL — Ephedrine Sulf-NaCl Soln Pref Syr 50 MG/10ML-0.9% (5 MG/ML): INTRAVENOUS | Qty: 10 | Status: AC

## 2017-10-27 MED FILL — Phenylephrine-NaCl Pref Syr 0.4 MG/10ML-0.9% (40 MCG/ML): INTRAVENOUS | Qty: 10 | Status: AC

## 2017-10-27 NOTE — Progress Notes (Signed)
PROGRESS NOTE [facility]   KASEY EWINGS   EBR:[id number] DOB: 12-23-1955  DOA: 10/23/2017 PCP: [name], MD   Brief Narrative:  Matthew Lewis is a 62 year old male with medical history significant for diabetes mellitus type 2, hypertension, alcoholic cirrhosis and history of colon cancer in remission.  Patient presented to the emergency department after being sent from primary care physician for abnormal LFTs.  Patient has been having diarrhea for 2 weeks prior to admission with weight loss and jaundice.  Patient was seen by PCP and started on empiric vancomycin for C. difficile and lab work-up revealed elevated liver enzymes with severely elevated T bili.  Patient admitted with working diagnosis of jaundice. GI was consulted.   Subjective: Patient seen and examined, s/p PTC drain, feeling much better. T bili trending down. Tolerated diet well this AM.   Assessment & Plan: Obstructive jaundice  Multifocal masses concerning for HCC and tumor invading the left portal vein with portal vein thrombosis.  Mass is obstructing the biliary tree. GI unable to perform ERCP, s/p percutaneous drain. Continue to monitor LFTs and INR.  T bili trending down.  Portal vein thrombosis On heparin drip, anticoagulation per GI team.  ? [place] with bilateral adrenal metastases and retroperitoneal nodal metastases AFP 1.3, CEA pending.  Oncology recommendations appreciated, suggesting patient need adrenal gland biopsy.  CT of the chest for staging.  Continue management per oncology team.  Hx of cirrhosis  [place] consistent with cirrhotic pattern, ammonia levels mild elevated, continue low-dose lactulose BID. Management per GI   HTN  BP stable, continue lisinopril  DM type 2  CBGs stable during hospital stay, continue with amiodarone at reduced dose and SSI.  A1c pending.  Diarrhea - resolved Prior to admission was treated with empiric oral vanc for presumed C-diff. Vancomycin has been  stopped, patient currently denies diarrhea, if recurs will get stool sample. Continue to monitor      DVT prophylaxis: Lovenox Code Status: Full code Family Communication: Family at bedside Disposition Plan: Home 24-48 hr pending work up and improvement of bili  Consultants:   GI  Oncology   IR   Procedures:     Antimicrobials: Anti-infectives (From admission, onward)   Start     Dose/Rate Route Frequency Ordered Stop   10/26/17 1215  ciprofloxacin (CIPRO) IVPB 400 mg     400 mg 200 mL/hr over 60 Minutes Intravenous On call 10/26/17 1202 10/26/17 1346         Objective: Vitals:   10/26/17 2025 10/26/17 2329 10/27/17 0432 10/27/17 1045  BP: 106/66 114/66 108/69 110/69  Pulse: 97 91 86   Resp: _0 Temp: 98.3 F (36.8 C) 97.9 F (36.6 C) (!) 97.5 F (36.4 C)   TempSrc: Oral Oral Oral   SpO2: 97% 97% 98%   Weight:      Height:        Intake/Output Summary (Last 24 hours) at 10/27/2017 1207 Last data filed at 10/27/2017 1151 Gross per 24 hour  Intake 730 ml  Output 2315 ml  Net -1585 ml   Filed Weights   10/23/17 1707  Weight: 103 kg    Examination:  General: Pt is alert, awake, not in acute distress HEENT: scleral icterus 2+  Cardiovascular: RRR, S1/S2  Respiratory: CTA bilaterally, no wheezing, no rhonchi Abdominal: Soft, NT, ND Extremities: no edema Skin: jaundice   Data Reviewed: I have personally reviewed following labs and imaging studies  CBC: Recent Labs  Lab  10/23/17 1746 10/24/17 0549 10/25/17 0551 10/26/17 0539 10/27/17 0249  WBC 8.5 7.7 8.2 6.7 7.6  NEUTROABS 6.4  --   --   --   --   HGB 13.0 12.0* 12.2* 11.9* 11.6*  HCT 39.5 35.7* 36.9* 36.1* 34.8*  MCV 91.6 92.2 91.8 91.6 91.1  PLT 273 204 225 198 683   Basic Metabolic Panel: Recent Labs  Lab 10/23/17 1746 10/24/17 0549 10/25/17 0551 10/26/17 0539 10/27/17 0249  NA 134* 130* 129* 131* 127*  K 4.2 4.2 4.7 4.5 3.9  CL 100 99 96* 98 95*  CO2 _0 GLUCOSE 120* 112* 131* 143* 121*  BUN _1 CREATININE 0.55* 0.30* 0.38* 0.42* 0.39*  CALCIUM 9.6 8.7* 8.8* 8.8* 8.6*  MG  --   --   --   --  2.2   GFR: Estimated Creatinine Clearance: 113.1 mL/min (A) (by C-G formula based on SCr of 0.39 mg/dL (L)). Liver Function Tests: Recent Labs  Lab 10/23/17 1746 10/24/17 0549 10/25/17 0551 10/26/17 0539 10/27/17 0249  AST 225* 204* 214* 203* 134*  ALT 220* 187* 197* 194* 166*  ALKPHOS 778* 682* 786* 762* 721*  BILITOT 16.9* 17.2* 20.4* 19.5* 12.6*  PROT 8.2* 7.3 7.4 7.1 7.4  ALBUMIN 3.0* 2.5* 2.4* 2.3* 2.3*   Recent Labs  Lab 10/23/17 1746  LIPASE 27   Recent Labs  Lab 10/23/17 1918 10/25/17 0551 10/27/17 0249  AMMONIA 47* 32 44*   Coagulation Profile: Recent Labs  Lab 10/23/17 1918 10/25/17 0551 10/26/17 0539  INR 1.07 1.15 1.12   Cardiac Enzymes: No results for input(s): CKTOTAL, CKMB, CKMBINDEX, TROPONINI in the last 168 hours. BNP (last 3 results) No results for input(s): PROBNP in the last 8760 hours. HbA1C: No results for input(s): HGBA1C in the last 72 hours. CBG: Recent Labs  Lab 10/26/17 0756 10/26/17 1408 10/26/17 1659 10/26/17 2023 10/27/17 0757  GLUCAP 155* 144* 188* 175* 114*   Lipid Profile: No results for input(s): CHOL, HDL, LDLCALC, TRIG, CHOLHDL, LDLDIRECT in the last 72 hours. Thyroid Function Tests: No results for input(s): TSH, T4TOTAL, FREET4, T3FREE, THYROIDAB in the last 72 hours. Anemia Panel: No results for input(s): VITAMINB12, FOLATE, FERRITIN, TIBC, IRON, RETICCTPCT in the last 72 hours. Sepsis Labs: No results for input(s): PROCALCITON, LATICACIDVEN in the last 168 hours.  No results found for this or any previous visit (from the past 240 hour(s)).    Radiology Studies: Ct Chest Wo Contrast  Result Date: 10/27/2017 CLINICAL DATA:  Multifocal liver lesions.  Weight loss. EXAM: CT CHEST WITHOUT CONTRAST TECHNIQUE: Multidetector CT imaging of the chest was performed  following the standard protocol without IV contrast. COMPARISON:  None FINDINGS: Cardiovascular: Normal heart size. No pericardial effusion. Aortic atherosclerosis. Calcification scratch set 3 vessel coronary artery atherosclerotic calcifications. Mediastinum/Nodes: Normal appearance of the thyroid gland. The trachea appears patent and is midline. Normal appearance of the esophagus. Subcarinal lymph node is enlarged measuring 2.1 cm. Enlarged azygos-esophageal lymph node measures 2 cm, image 28/2. Right hilar lymph node is enlarged measuring 1.8 cm, image 82/2. Lungs/Pleura: Subpleural area of masslike architectural distortion within the posterolateral right lung base is identified measuring 4.9 x 5.0 cm, image 104/2. Nodule within the left lower lobe measures 2.4 cm, image 111/7. Upper Abdomen: Cirrhotic appearing liver is again noted. There has been interval placement of percutaneous transit [facility] biliary drainage catheter. Musculoskeletal: No chest wall mass or suspicious bone lesions identified. IMPRESSION: 1.  Large mass in the posterolateral right lung base and left lower lobe pulmonary nodule. Suspicious for malignancy. Cannot rule out metastatic disease from suspected multifocal hepatoma. 2. Right hilar and mediastinal adenopathy. 3.  Aortic Atherosclerosis (ICD10-I70.0). 4. Multi vessel coronary artery atherosclerotic calcifications. Electronically Signed   By: Kerby Moors M.D.   On: 10/27/2017 11:58   Ir Int Lianne Cure Biliary Drain With Cholangiogram  Result Date: 10/26/2017 INDICATION: 62 year old male with a history of cirrhosis, presumed HCC, portal vein thrombosis, and jaundice with evidence of obstructed biliary system at the liver hilum. EXAM: IMAGE GUIDED PERCUTANEOUS TRANSHEPATIC CHOLANGIOGRAM WITH INTERNAL/EXTERNAL BILIARY DRAINAGE MEDICATIONS: Ciprofloxacin 400 mg IV; The antibiotic was administered within an appropriate time frame prior to the initiation of the procedure. 4 MG IV ZOFRAN  ANESTHESIA/SEDATION: Moderate (conscious) sedation was employed during this procedure. A total of Versed 4.0 mg and Fentanyl 100 mcg was administered intravenously. Moderate Sedation Time: 17 MINUTES minutes. The patient's level of consciousness and vital signs were monitored continuously by radiology nursing throughout the procedure under my direct supervision. FLUOROSCOPY TIME:  Fluoroscopy Time: 3 minutes 18 seconds (175 mGy). COMPLICATIONS: None PROCEDURE: The procedure, risks, benefits, and alternatives were explained to the patient and the patient's family. A complete informed consent was performed, with risk benefit analysis. Specific risks that were discussed for the procedure include bleeding, infection, biliary sepsis, IC use day, organ injury, need for further procedure, need for further surgery, long-term drain placement, cardiopulmonary collapse, death. Questions regarding the procedure were encouraged and answered. The patient understands and consents to the procedure. Patient is position in supine position on the fluoroscopy table, and the upper abdomen was prepped and draped in the usual sterile fashion. Maximum barrier sterile technique with sterile gowns and gloves were used for the procedure. A timeout was performed prior to the initiation of the procedure. Local anesthesia was provided with 1% lidocaine with epinephrine. Ultrasound survey of the left liver lobe was performed, with then ultrasound of the right liver lobe. 1% lidocaine was used for local anesthesia, with generous infiltration of the skin and subcutaneous tissues in and inter left costal location. Formal PTC puncture of the right liver was performed from intercostal approach. Once the tip of the needle was confirmed within the biliary system by observing for spontaneous bile return, and injecting small aliquots of contrast, images were stored of the biliary system after partially opacifying the biliary tree via the needle. A second  access was required for access into the biliary system, from similar right-sided intercostal location. Once the tip of this second needle was confirmed within the biliary system, an 018 wire was advanced centrally. The needle was removed, a small incision was made with an 11 blade scalpel, and then a triaxial Accustick system was advanced into the biliary system. The metal stiffener and dilator were removed, we confirmed placement with contrast infusion. A coaxial Glidewire and 4 French glide cath were then used to navigate across the obstruction at the hilum of the liver. Once the catheter was presumed to be within the duodenum, the wire was removed and contrast confirmed location. A stiff Amplatz wire was advanced through the system, and the Accustick and Glidewire were removed. Dilation of the subcutaneous tissue tracks was performed with an 8 [place] dilator, and then a 10 [place] biliary drain was placed as an internal/external biliary drain. Small amount of contrast confirmed location. The patient remained hemodynamically stable throughout. Placement of the drain resulted in significant nausea. We elected to defer the left-sided  drainage at this time. No complications were encountered and no significant blood loss was encountered. IMPRESSION: Status post formal PTC and internal/external biliary drainage. Signed, Dulcy Fanny. Dellia Nims, RPVI [facility] Electronically Signed   By: Corrie Mckusick D.O.   On: 10/26/2017 14:45     Scheduled Meds: . feeding supplement  1 Container Oral TID BM  . gabapentin  400 mg Oral Daily  . gabapentin  800 mg Oral QHS  . insulin aspart  0-9 Units Subcutaneous TID WC & HS  . insulin detemir  41 Units Subcutaneous Daily  . insulin detemir  6 Units Subcutaneous QHS  . lactulose  10 g Oral BID  . lisinopril  20 mg Oral Daily  . multivitamin with minerals  1 tablet Oral Daily  . protein supplement  1 packet Oral TID    . sodium chloride flush  5 mL Intracatheter Q8H   Continuous Infusions: . heparin 1,800 Units/hr (10/27/17 0854)     LOS: 4 days   Time spent: Total of 15 minutes spent with pt, greater than 50% of which was spent in discussion of  treatment, counseling and coordination of care   Chipper Oman, MD Pager: Text Page via [email or web address]   If 7PM-7AM, please contact night-coverage [email or web address] 10/27/2017, 12:07 PM   Note - This record has been created using [employer]. Chart creation errors have been sought, but may not always have been located. Such creation errors do not reflect on the standard of medical care.

## 2017-10-27 NOTE — Progress Notes (Signed)
Patient ID: Matthew Lewis, male   DOB: 12-24-1955, 62 y.o.   MRN: [id number]  Pt is s/p PTC with int/ext drainage of Right system yesterday   Tbili down from 19 to 12.6 today  CEA pending  Oncology /Dr Burr Medico  Consulted this am  and recommends bx of metastatic adrenal lesion by IR, and chest CT   Probable metastatic multifocal HCC with bilary obstruction and PVT  Workup in progress GI will continue to follow along peripherally

## 2017-10-27 NOTE — Progress Notes (Signed)
Flushed drain with 5 ml. Patient began to have sudden pain and nausea following flush but resolved in about 5 minutes. No significant drainage since emptying. Made PA from IR aware.

## 2017-10-27 NOTE — Progress Notes (Signed)
Referring Physician(s): Dr. Zenovia Jarred  Supervising Physician: Aletta Edouard  Patient Status:  [facility] - In-pt  Chief Complaint: Biliary obstruction  Subjective: Feeling better.  No complaints. Tube not bothering him.   Allergies: Morphine and related and Other  Medications: Prior to Admission medications   Medication Sig Start Date End Date Taking? Authorizing Provider  aspirin EC 81 MG tablet Take 81 mg by mouth daily.   Yes [provider]  atorvastatin (LIPITOR) 40 MG tablet Take 1 tablet (40 mg total) by mouth daily at 6 PM. Patient taking differently: Take 20 mg by mouth daily at 6 PM.  12/02/12  Yes Viyuoh, Adeline C, MD  calcium carbonate (TUMS - DOSED IN MG ELEMENTAL CALCIUM) 500 MG chewable tablet Chew 1 tablet by mouth 2 (two) times daily as needed for indigestion or heartburn.   Yes [provider]  celecoxib (CELEBREX) 200 MG capsule Take 1 capsule by mouth daily. 10/10/17  Yes [provider]  empagliflozin (JARDIANCE) 10 MG TABS tablet Take 10 mg by mouth at bedtime.    Yes [provider]  Fexofenadine-Pseudoephedrine (ALLEGRA-D 24 HOUR PO) Take by mouth daily. WAL/FEX   Yes [provider]  gabapentin (NEURONTIN) 400 MG capsule Take 400-800 mg by mouth 2 (two) times daily. 1 capsule am, 2 capsules pm   Yes [provider]  glipiZIDE (GLUCOTROL) 5 MG tablet Take 5 mg by mouth 2 (two) times daily before a meal.   Yes [provider]  Insulin Detemir (LEVEMIR FLEXPEN) 100 UNIT/ML Pen Inject 10-60 Units into the skin 2 (two) times daily. Take 62 units in the am and Take 10 units in the pm   Yes [provider]  lisinopril (PRINIVIL,ZESTRIL) 20 MG tablet Take 20 mg by mouth daily.   Yes [provider]  metFORMIN (GLUCOPHAGE) 1000 MG tablet Take 1,000 mg by mouth 2 (two) times daily with a meal.   Yes [provider]  Multiple Vitamin (MULTIVITAMIN) tablet Take 1 tablet by mouth  daily.   Yes [provider]  polyvinyl alcohol (LIQUIFILM TEARS) 1.4 % ophthalmic solution 1 drop as needed for dry eyes.   Yes [provider]  vancomycin (VANCOCIN) 125 MG capsule Take 1 capsule by mouth every 6 (six) hours. 10/23/17  Yes [provider]     Vital Signs: BP 110/69   Pulse 86   Temp (!) 97.5 F (36.4 C) (Oral)   Resp 16   Ht 5' 10.5" (1.791 m)   Wt 227 lb (103 kg)   SpO2 98%   BMI 32.11 kg/m   Physical Exam  NAD, alert Abdomen: RUQ drain in place.  Insertion site c/d/i.  Dark, bilious output in collection bag. Having significant amount of output per records. Bag is quite full during assessment today.   Imaging: US Abdomen Complete  Result Date: 10/23/2017 CLINICAL DATA:  Jaundiced. EXAM: ABDOMEN ULTRASOUND COMPLETE COMPARISON:  Body CT 02/24/2017 FINDINGS: Gallbladder: No gallstones seen. There is gallbladder wall thickening measuring 6 mm. No sonographic Murphy sign noted by sonographer. Common bile duct: Diameter: 6.3 mm Liver: Coarse nodular echogenicity of the liver. Area of heterogeneity or an ill-defined mass in segment 6 of the liver measures 2.6 x 2.4 x 2.2 cm. Portal vein is patent on color Doppler imaging with normal direction of blood flow towards the liver. IVC: No abnormality visualized. Pancreas: Visualized portion unremarkable. Spleen: Splenomegaly. Splenic length is 16.2 cm with volume of 994 cubic cm. Right Kidney: Length:  14.8 cm. Echogenicity within normal limits. No mass or hydronephrosis visualized. Left Kidney: Length: 14.7 cm. Echogenicity within normal limits. No mass or hydronephrosis visualized. Abdominal aorta: No aneurysm visualized. Other findings: None. IMPRESSION: Cirrhotic appearance of the liver. 2.6 cm questionable ill-defined mass versus area of focal parenchymal heterogeneity in segment 6 of the liver. If further imaging evaluation is desired, liver protocol abdominal MRI should be considered. Splenomegaly.  Apparent thickening of the gallbladder wall, without evidence of cholelithiasis, and with negative sonographic Murphy's sign. The findings are favored to represent gallbladder wall edema secondary to systemic causes such as hyperproteinemia rather than acute cholecystitis. Please correlate clinically. Electronically Signed   By: Fidela Salisbury M.D.   On: 10/23/2017 20:58   Mr 3d Recon At Scanner  Result Date: 10/25/2017 CLINICAL DATA:  Evaluate liver lesion. EXAM: MRI ABDOMEN WITHOUT AND WITH CONTRAST (INCLUDING MRCP) TECHNIQUE: Multiplanar multisequence MR imaging of the abdomen was performed both before and after the administration of intravenous contrast. Heavily T2-weighted images of the biliary and pancreatic ducts were obtained, and three-dimensional MRCP images were rendered by post processing. CONTRAST:  10 cc Gadavist COMPARISON:  CT AP 02/24/2017. FINDINGS: Lower chest: Bilateral airspace consolidation identified. Hepatobiliary: The liver is cirrhotic. There is a diffuse nodular contour with hypertrophy of the caudate lobe and lateral segment of left lobe. There has been interval development of multiple suspicious enhancing liver lesions involving both lobes. -index lesion within the anterior aspect of segment 7 measures 3.6 cm, image 30/26. -Index lesion within segment 6 measures 2.4 cm, image 63/26. -index lesion within segment 2 measures 1.3 cm, image 45/28. -index lesion is within segment 4B measures 4.3 cm, image 45/26. There is associated tumor thrombus involving the left branch of the portal vein, image 39/28. Additionally, this mass obstructs the right medial hepatic duct and left main hepatic duct. The right lateral (posterior) duct appears mildly dilated and may be partially obstructed. Mild gallbladder wall edema and pericholecystic fluid. No common bile duct dilatation. Pancreas: No mass, inflammatory changes, or other parenchymal abnormality identified. Spleen:  Splenomegaly.  Adrenals/Urinary Tract: New bilateral adrenal lesions compatible with metastatic disease. Right adrenal nodule measures 4 cm, image 49/32. The left adrenal metastasis measures 1.7 cm, image 56/32. Unremarkable appearance of the kidneys. Stomach/Bowel: Visualized portions within the abdomen are unremarkable. Vascular/Lymphatic: Aortic atherosclerosis. No aneurysm. The main portal vein appears patent. Interval development of retroperitoneal adenopathy. Index right retrocaval node measures 1.7 cm, image 79/28. Small distal esophageal and perigastric varices. Other:  Mild perihepatic ascites. Musculoskeletal: No suspicious bone lesions identified. IMPRESSION: 1. Advanced changes of cirrhosis. Interval development of multifocal enhancing liver lesions, which is concerning for multicentric hepatocellular carcinoma involving both lobes of liver. 2. Lesion within segment 4 B obstructs the right medial hepatic duct and left main duct resulting in moderate to marked biliary ductal dilatation within the left lobe and dome of liver. There may be partial obstruction of the right lateral duct with mild dilatation. 3. Tumor thrombus within the left branch of the portal vein likely secondary to segment 4 B tumor. 4. Bilateral adrenal metastasis and retroperitoneal nodal metastasis. 5. Stigmata of portal venous hypertension including varices, ascites and splenomegaly. 6. These results will be called to the ordering clinician or representative by the Radiologist Assistant, and communication documented in the PACS or zVision Dashboard. Electronically Signed   By: Kerby Moors M.D.   On: 10/25/2017 13:31   Mr Abdomen Mrcp Moise Boring Contast  Result Date: 10/25/2017 CLINICAL DATA:  Evaluate  liver lesion. EXAM: MRI ABDOMEN WITHOUT AND WITH CONTRAST (INCLUDING MRCP) TECHNIQUE: Multiplanar multisequence MR imaging of the abdomen was performed both before and after the administration of intravenous contrast. Heavily T2-weighted images of  the biliary and pancreatic ducts were obtained, and three-dimensional MRCP images were rendered by post processing. CONTRAST:  10 cc Gadavist COMPARISON:  CT AP 02/24/2017. FINDINGS: Lower chest: Bilateral airspace consolidation identified. Hepatobiliary: The liver is cirrhotic. There is a diffuse nodular contour with hypertrophy of the caudate lobe and lateral segment of left lobe. There has been interval development of multiple suspicious enhancing liver lesions involving both lobes. -index lesion within the anterior aspect of segment 7 measures 3.6 cm, image 30/26. -Index lesion within segment 6 measures 2.4 cm, image 63/26. -index lesion within segment 2 measures 1.3 cm, image 45/28. -index lesion is within segment 4B measures 4.3 cm, image 45/26. There is associated tumor thrombus involving the left branch of the portal vein, image 39/28. Additionally, this mass obstructs the right medial hepatic duct and left main hepatic duct. The right lateral (posterior) duct appears mildly dilated and may be partially obstructed. Mild gallbladder wall edema and pericholecystic fluid. No common bile duct dilatation. Pancreas: No mass, inflammatory changes, or other parenchymal abnormality identified. Spleen:  Splenomegaly. Adrenals/Urinary Tract: New bilateral adrenal lesions compatible with metastatic disease. Right adrenal nodule measures 4 cm, image 49/32. The left adrenal metastasis measures 1.7 cm, image 56/32. Unremarkable appearance of the kidneys. Stomach/Bowel: Visualized portions within the abdomen are unremarkable. Vascular/Lymphatic: Aortic atherosclerosis. No aneurysm. The main portal vein appears patent. Interval development of retroperitoneal adenopathy. Index right retrocaval node measures 1.7 cm, image 79/28. Small distal esophageal and perigastric varices. Other:  Mild perihepatic ascites. Musculoskeletal: No suspicious bone lesions identified. IMPRESSION: 1. Advanced changes of cirrhosis. Interval  development of multifocal enhancing liver lesions, which is concerning for multicentric hepatocellular carcinoma involving both lobes of liver. 2. Lesion within segment 4 B obstructs the right medial hepatic duct and left main duct resulting in moderate to marked biliary ductal dilatation within the left lobe and dome of liver. There may be partial obstruction of the right lateral duct with mild dilatation. 3. Tumor thrombus within the left branch of the portal vein likely secondary to segment 4 B tumor. 4. Bilateral adrenal metastasis and retroperitoneal nodal metastasis. 5. Stigmata of portal venous hypertension including varices, ascites and splenomegaly. 6. These results will be called to the ordering clinician or representative by the Radiologist Assistant, and communication documented in the PACS or zVision Dashboard. Electronically Signed   By: Kerby Moors M.D.   On: 10/25/2017 13:31   Ir Int Lianne Cure Biliary Drain With Cholangiogram  Result Date: 10/26/2017 INDICATION: 62 year old male with a history of cirrhosis, presumed HCC, portal vein thrombosis, and jaundice with evidence of obstructed biliary system at the liver hilum. EXAM: IMAGE GUIDED PERCUTANEOUS TRANSHEPATIC CHOLANGIOGRAM WITH INTERNAL/EXTERNAL BILIARY DRAINAGE MEDICATIONS: Ciprofloxacin 400 mg IV; The antibiotic was administered within an appropriate time frame prior to the initiation of the procedure. 4 MG IV ZOFRAN ANESTHESIA/SEDATION: Moderate (conscious) sedation was employed during this procedure. A total of Versed 4.0 mg and Fentanyl 100 mcg was administered intravenously. Moderate Sedation Time: 17 MINUTES minutes. The patient's level of consciousness and vital signs were monitored continuously by radiology nursing throughout the procedure under my direct supervision. FLUOROSCOPY TIME:  Fluoroscopy Time: 3 minutes 18 seconds (175 mGy). COMPLICATIONS: None PROCEDURE: The procedure, risks, benefits, and alternatives were explained to the  patient and the patient's family. A complete  informed consent was performed, with risk benefit analysis. Specific risks that were discussed for the procedure include bleeding, infection, biliary sepsis, IC use day, organ injury, need for further procedure, need for further surgery, long-term drain placement, cardiopulmonary collapse, death. Questions regarding the procedure were encouraged and answered. The patient understands and consents to the procedure. Patient is position in supine position on the fluoroscopy table, and the upper abdomen was prepped and draped in the usual sterile fashion. Maximum barrier sterile technique with sterile gowns and gloves were used for the procedure. A timeout was performed prior to the initiation of the procedure. Local anesthesia was provided with 1% lidocaine with epinephrine. Ultrasound survey of the left liver lobe was performed, with then ultrasound of the right liver lobe. 1% lidocaine was used for local anesthesia, with generous infiltration of the skin and subcutaneous tissues in and inter left costal location. Formal PTC puncture of the right liver was performed from intercostal approach. Once the tip of the needle was confirmed within the biliary system by observing for spontaneous bile return, and injecting small aliquots of contrast, images were stored of the biliary system after partially opacifying the biliary tree via the needle. A second access was required for access into the biliary system, from similar right-sided intercostal location. Once the tip of this second needle was confirmed within the biliary system, an 018 wire was advanced centrally. The needle was removed, a small incision was made with an 11 blade scalpel, and then a triaxial Accustick system was advanced into the biliary system. The metal stiffener and dilator were removed, we confirmed placement with contrast infusion. A coaxial Glidewire and 4 French glide cath were then used to navigate across  the obstruction at the hilum of the liver. Once the catheter was presumed to be within the duodenum, the wire was removed and contrast confirmed location. A stiff Amplatz wire was advanced through the system, and the Accustick and Glidewire were removed. Dilation of the subcutaneous tissue tracks was performed with an 8 [place] dilator, and then a 10 [place] biliary drain was placed as an internal/external biliary drain. Small amount of contrast confirmed location. The patient remained hemodynamically stable throughout. Placement of the drain resulted in significant nausea. We elected to defer the left-sided drainage at this time. No complications were encountered and no significant blood loss was encountered. IMPRESSION: Status post formal PTC and internal/external biliary drainage. Signed, Dulcy Fanny. Dellia Nims, RPVI [facility] Radiology Electronically Signed   By: Corrie Mckusick D.O.   On: 10/26/2017 14:45    Labs:  CBC: Recent Labs    10/24/17 0549 10/25/17 0551 10/26/17 0539 10/27/17 0249  WBC 7.7 8.2 6.7 7.6  HGB 12.0* 12.2* 11.9* 11.6*  HCT 35.7* 36.9* 36.1* 34.8*  PLT 204 225 198 231    COAGS: Recent Labs    04/02/17 1346 10/23/17 1918 10/25/17 0551 10/26/17 0539  INR 1.2* 1.07 1.15 1.12    BMP: Recent Labs    10/24/17 0549 10/25/17 0551 10/26/17 0539 10/27/17 0249  NA 130* 129* 131* 127*  K 4.2 4.7 4.5 3.9  CL 99 96* 98 95*  CO2 _0 GLUCOSE 112* 131* 143* 121*  BUN _1 CALCIUM 8.7* 8.8* 8.8* 8.6*  CREATININE 0.30* 0.38* 0.42* 0.39*  GFRNONAA >60 >60 >60 >60  GFRAA >60 >60 >60 >60    LIVER FUNCTION TESTS: Recent Labs    10/24/17 0549 10/25/17 0551 10/26/17 0539 10/27/17  0249  BILITOT 17.2* 20.4* 19.5* 12.6*  AST 204* 214* 203* 134*  ALT 187* 197* 194* 166*  ALKPHOS 682* 786* 762* 721*  PROT 7.3 7.4 7.1 7.4  ALBUMIN 2.5* 2.4* 2.3* 2.3*    Assessment and Plan: Biliary obstruction s/p  internal/external PTC drain from right-sided approach Patient is improved s/p drain placement yesterday.  T bili down to 12.6 WBC remain WNL.  No nausea reported today.   Also note request for possible adrenal lesion biopsy.  Imaging reviewed by Dr. Kathlene Cote who recommends CT vs. PET prior to proceeding due to potential necrotic appearance of lesion by MRI.  No procedure planned in IR today.   Continue current management.   Electronically Signed: Docia Barrier, PA 10/27/2017, 11:32 AM   I spent a total of 15 Minutes at the the patient's bedside AND on the patient's hospital floor or unit, greater than 50% of which was counseling/coordinating care for biliary obstruction.

## 2017-10-27 NOTE — Progress Notes (Signed)
ANTICOAGULATION CONSULT NOTE   Pharmacy Consult for heparin Indication: portal vein thrombosis  Allergies  Allergen Reactions  . Morphine And Related Other (See Comments)    PATIENT IS RECOVERING FROM DRUG ADDICTION PATIENT WANTS TO AVOID ANY NARCOTICS  . Other Other (See Comments)    PATIENT IS RECOVERING FROM DRUG ADDICTION PATIENT WANTS TO AVOID ANY NARCOTICS   Patient Measurements: Height: 5' 10.5" (179.1 cm) Weight: 227 lb (103 kg) IBW/kg (Calculated) : 74.15 Heparin Dosing Weight: 96 kg  Vital Signs: Temp: 97.9 F (36.6 C) (10/20 2329) Temp Source: Oral (10/20 2329) BP: 114/66 (10/20 2329) Pulse Rate: 91 (10/20 2329)  Labs: Recent Labs    10/24/17 0549 10/25/17 0551 10/25/17 2044 10/26/17 0539 10/27/17 0249  HGB 12.0* 12.2*  --  11.9* 11.6*  HCT 35.7* 36.9*  --  36.1* 34.8*  PLT 204 225  --  198 231  LABPROT  --  14.6  --  14.3  --   INR  --  1.15  --  1.12  --   HEPARINUNFRC  --   --  <0.10* <0.10* 0.10*  CREATININE 0.30* 0.38*  --  0.42*  --    Estimated Creatinine Clearance: 113.1 mL/min (A) (by C-G formula based on SCr of 0.42 mg/dL (L)).  Medical History: Past Medical History:  Diagnosis Date  . Abscess of anal and rectal regions    horse shoe abscess  . Alcoholic cirrhosis ([place])   . Arthritis    "everywhere; especially in my spine"  . Cirrhosis ([place])    STAGE 1 CIRRHOSIS PATIENT SEES DR NAMDIGAM FOR  . Colon cancer ([place]) 05/2005   TX SURGERY WITH LYMPH NODE REMOVAL  . Cough    LAST FEW WEEKS SAW DR RAMOS LUNGS MILKY SPUTUM OCC  . DM type 2 with diabetic peripheral neuropathy ([facility])    left foot  . Fatigue   . Fistula, anal    multiple  . History of substance abuse ([place])    last alcohol was 08/2011; last marijuania was 08/2011  . Hypertension   . PTSD (post-traumatic stress disorder)    "severe" HX OF  . Sleep deprivation    Assessment: Patient is a 62 y.o M with hx EtOH abuse, cirrhosis and colon cancer presented to the ED on 10/23/17  with jaundice.  LFTS and Tbili elevated on admission. INR wnl. MRCP on 10/19 showed portal vein thrombosis. To start heparin drip on 10/19 for thrombosis.  10/19:  INR 1.15, LFTs and Tbili remain elevated - last lovenox 40 mg SQ dose given on 10/18 at 1129 - Heparin 2500 unit bolus, infusion at 1400 units/hr, conservative dosing with liver dysfunction -2128 HL= <0.10 below goal, no bleeding or infusion issues per RN.   10/20  HL at 0539 = < 0.10 units/ml, Pharmacy was unaware that Heparin was to be stopped at 5am. The Nursing care order to stop Heparin per radiology from 10/19 for 3am today "completed" at same time.  Heparin order not changed to reflect stop time.  RN stopped Heparin as ordered  Planned cholangiogram and bile drain placement today per IR Today, 10/21  0249 HL= 0.10 below goal, no infusion or bleeding issues per RN.  Goal of Therapy:  Heparin level 0.3-0.7 units/ml Monitor platelets by anticoagulation protocol: Yes   Plan:   Increase  Heparin drip to 1800 units/hr  Daily CBC/HL  Check next HL in 6 hours   Dorrene German 10/27/2017, 3:46 AM

## 2017-10-27 NOTE — Care Management Important Message (Signed)
Important Message  Patient Details  Name: QUE MENEELY MRN: [id number] Date of Birth: 08-05-1955   Medicare Important Message Given:  Yes    Kerin Salen 10/27/2017, 12:48 [place] Message  Patient Details  Name: TRACE WIRICK MRN: [id number] Date of Birth: 1955/11/12   Medicare Important Message Given:  Yes    Kerin Salen 10/27/2017, 12:48 PM

## 2017-10-27 NOTE — Consult Note (Signed)
[place]  [phone]   HEMATOLOGY ONCOLOGY INPATIENT CONSULTATION   Matthew Lewis  DOB: 30-Sep-1955  MR#: [id number]  CSN#: [id number]    Requesting Physician: [facility]  Patient Care Team: [name], MD as PCP - General (Internal Medicine)  Reason for consult: liver masses, and adrenal mets   History of present illness: 62 year old Caucasian male, with past medical history of stage II descending colon cancer in 2007, that post left hemicolectomy, recurrent perianal stool and abscess, was last surgery in September 2019,  multiple substance abuse including alcohol, liver cirrhosis from alcohol and Matthew Lewis, presented with abdominal pain and jaundice for a few weeks, and 20 pound weight loss in the past few months.  He states his pain is located in the upper abdomen, persistent, associated with nausea and low appetite, no change of his bowel habits, no melena or hematochezia.  He was referred to emergency room by his primary care physician after abnormal labs.  He was hospitalized for acute duodenitis in 2018, and was found to have liver cirrhosis, which was felt to be related to his alcohol and Matthew Lewis.  He has stopped alcohol drinking and other multiple substance abuse about 6 years ago.  He has been seeing GI doctor and had again since then.  EGD showed varices, last colonoscopy was 5 years ago.   He underwent MRI of abdomen with MRCP in the hospital, which showed multiple liver lesions, bilateral adrenal gland masses and retroperitoneal adenopathy concerning for metastasis.  He was seen by GI and IR, and underwent percutaneous biliary drainage tube placement yesterday.  He is married, lives with his wife, has 2 children.  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  He has been feeling quite fatigued lately, still able to function at home.  His appetite has decreased, lost about 20 pounds in the past few months.  He denies any chest pain, cough,  or other new symptoms.  MEDICAL HISTORY:  Past Medical History:  Diagnosis Date  . Abscess of anal and rectal regions    horse shoe abscess  . Alcoholic cirrhosis ([place])   . Arthritis    "everywhere; especially in my spine"  . Cirrhosis ([place])    STAGE 1 CIRRHOSIS PATIENT SEES DR NAMDIGAM FOR  . Colon cancer ([place]) 05/2005   TX SURGERY WITH LYMPH NODE REMOVAL  . Cough    LAST FEW WEEKS SAW DR RAMOS LUNGS MILKY SPUTUM OCC  . DM type 2 with diabetic peripheral neuropathy ([facility])    left foot  . Fatigue   . Fistula, anal    multiple  . History of substance abuse ([place])    last alcohol was 08/2011; last marijuania was 08/2011  . Hypertension   . PTSD (post-traumatic stress disorder)    "severe" HX OF  . Sleep deprivation     SURGICAL HISTORY: Past Surgical History:  Procedure Laterality Date  . ABSCESS DRAINAGE     "probably 15-20 so far at least; rectal"  . ANAL FISTULECTOMY  12/13/1998  . ANTERIOR FUSION CERVICAL SPINE  2010   "triple"; "did 2 surgeries on the same day in 3244 due to complications"  . CORONARY ANGIOPLASTY  2001  . detached muscle  2010   right chest; "after cervical fusion complications"  . ELBOW SURGERY  2007   "cut out part of a muscle"; left  . EXAMINATION UNDER ANESTHESIA  03/22/2005   fistula  . EXAMINATION UNDER ANESTHESIA  06/06/2011   Procedure: EXAM UNDER  ANESTHESIA;  Surgeon: Stark Klein, MD;  Location: [facility];  Service: General;  Laterality: N/A;  . HEMICOLECTOMY  06/06/2005   left  . INCISION AND DRAINAGE PERIRECTAL ABSCESS  03/30/2005  . INCISION AND DRAINAGE PERIRECTAL ABSCESS  12/13/2004  . INCISION AND DRAINAGE PERIRECTAL ABSCESS  07/14/2001  . INCISION AND DRAINAGE PERIRECTAL ABSCESS  08/12/2010   horseshoe abscess; Dr Redmond Pulling  . INCISION AND DRAINAGE PERIRECTAL ABSCESS  06/06/2011   Procedure: IRRIGATION AND DEBRIDEMENT PERIRECTAL ABSCESS;  Surgeon: Stark Klein, MD;  Location: [facility] [facility];  Service: General;  Laterality: N/A;  . INCISION AND DRAINAGE  PERIRECTAL ABSCESS N/A 02/24/2017   Procedure: IRRIGATION AND DEBRIDEMENT PERIRECTAL ABSCESS;  Surgeon: Ralene Ok, MD;  Location: [facility];  Service: General;  Laterality: N/A;  . IR INT EXT BILIARY DRAIN WITH CHOLANGIOGRAM  10/26/2017  . [place]  . PERCUTANEOUS PINNING PHALANX FRACTURE OF HAND  ~ 2008   "plates in 2 places"; right  . PLACEMENT OF SETON N/A 09/05/2017   Procedure: PLACEMENT OF SETON;  Surgeon: Leighton Ruff, MD;  Location: [facility];  Service: General;  Laterality: N/A;  . THORACIC DISCECTOMY  [phone]    SOCIAL HISTORY: Social History   Socioeconomic History  . Marital status: Married    Spouse name: Not on file  . Number of children: Not on file  . Years of education: Not on file  . Highest education level: Not on file  Occupational History  . Occupation: works at [employer]  . Financial resource strain: Not on file  . Food insecurity:    Worry: Not on file    Inability: Not on file  . Transportation needs:    Medical: Not on file    Non-medical: Not on file  Tobacco Use  . Smoking status: Former Smoker    Packs/day: 1.00    Years: 10.00    Pack years: 10.00    Types: Cigarettes    Last attempt to quit: 01/08/1991    Years since quitting: 26.8  . Smokeless tobacco: Never Used  Substance and Sexual Activity  . Alcohol use: Yes    Comment: 06/05/11 "drank enough when I was younger to last me my whole life; don't remember when I had my last drink, maybe 2012"  . Drug use: Not Currently    Types: Oxycodone, Marijuana    Comment: 06/05/11 "pot head in my younger days; 2009 dr had me on oxycodone and valium for almost 62yr  . Sexual activity: Not Currently  Lifestyle  . Physical activity:    Days per week: Not on file    Minutes per session: Not on file  . Stress: Not on file  Relationships  . Social connections:    Talks on phone: Not on file    Gets together: Not on file    Attends religious  service: Not on file    Active member of club or organization: Not on file    Attends meetings of clubs or organizations: Not on file    Relationship status: Not on file  . Intimate partner violence:    Fear of current or ex partner: Not on file    Emotionally abused: Not on file    Physically abused: Not on file    Forced sexual activity: Not on file  Other Topics Concern  . Not on file  Social History Narrative  . Not on file    FAMILY HISTORY: Family History  Problem  Relation Age of Onset  . Heart disease Father   . Heart disease Mother   . Cancer Mother   . Cancer Sister   . Heart disease Brother     ALLERGIES:  is allergic to morphine and related and other.  MEDICATIONS:  Current Facility-Administered Medications  Medication Dose Route Frequency Provider Last Rate Last Dose  . acetaminophen (TYLENOL) tablet 1,000 mg  1,000 mg Oral Q8H PRN Patrecia Pour, Christean Grief, MD   1,000 mg at 10/26/17 1417  . calcium carbonate (TUMS - dosed in mg elemental calcium) chewable tablet 200 mg of elemental calcium  1 tablet Oral BID PRN Etta Quill, DO      . feeding supplement (BOOST / RESOURCE BREEZE) liquid 1 Container  1 Container Oral TID BM Etta Quill, DO   1 Container at 10/24/17 2041  . gabapentin (NEURONTIN) capsule 400 mg  400 mg Oral Daily Nidal, Rivet M, DO   400 mg at 10/26/17 1417  . gabapentin (NEURONTIN) capsule 800 mg  800 mg Oral QHS Jennette Kettle M, DO   800 mg at 10/26/17 2047  . heparin ADULT infusion 100 units/mL (25000 units/242m sodium chloride 0.45%)  1,800 Units/hr Intravenous Continuous GDorrene German RPH 18 mL/hr at 10/27/17 0349 1,800 Units/hr at 10/27/17 0349  . HYDROcodone-acetaminophen (NORCO/VICODIN) 5-325 MG per tablet 1-2 tablet  1-2 tablet Oral Q6H PRN BVertis Kelch NP   2 tablet at 10/27/17 0032  . hydrOXYzine (ATARAX/VISTARIL) tablet 10 mg  10 mg Oral Q6H PRN SPatrecia Pour EChristean Grief MD      . insulin aspart (novoLOG) injection 0-9  Units  0-9 Units Subcutaneous TID WC & HS SPatrecia Pour EChristean Grief MD   2 Units at 10/26/17 2046  . insulin detemir (LEVEMIR) injection 41 Units  41 Units Subcutaneous Daily GReece, Fehnel DO   41 Units at 10/26/17 1419  . insulin detemir (LEVEMIR) injection 6 Units  6 Units Subcutaneous QHS GDishon, Kehoe DO   6 Units at 10/26/17 2047  . lactulose (CHRONULAC) 10 GM/15ML solution 10 g  10 g Oral BID SPatrecia Pour EChristean Grief MD   10 g at 10/26/17 2047  . lisinopril (PRINIVIL,ZESTRIL) tablet 20 mg  20 mg Oral Daily GJennette KettleM, DO   20 mg at 10/26/17 1416  . multivitamin with minerals tablet 1 tablet  1 tablet Oral Daily GEtta Quill DO   1 tablet at 10/26/17 1417  . ondansetron (ZOFRAN) tablet 4 mg  4 mg Oral Q6H PRN GEtta Quill DO       Or  . ondansetron ([facility] injection 4 mg  4 mg Intravenous Q6H PRN GEtta Quill DO   4 mg at 10/26/17 2011  . polyvinyl alcohol (LIQUIFILM TEARS) 1.4 % ophthalmic solution 1 drop  1 drop Both Eyes PRN GEtta Quill DO      . protein supplement (UNJURY CHICKEN SOUP) powder 1 packet  1 packet Oral TID SPatrecia Pour EChristean Grief MD      . sodium chloride flush (NS) 0.9 % injection 5 mL  5 mL Intracatheter Q8H WCorrie Mckusick DO   5 mL at 10/27/17 0700    REVIEW OF SYSTEMS:   Constitutional: Denies fevers, chills or abnormal night sweats Eyes: Denies blurriness of vision, double vision or watery eyes Ears, nose, mouth, throat, and face: Denies mucositis or sore throat Respiratory: Denies cough, dyspnea or wheezes Cardiovascular: Denies palpitation, chest discomfort or lower extremity swelling Gastrointestinal:  Denies nausea, heartburn or change  in bowel habits Skin: Denies abnormal skin rashes Lymphatics: Denies new lymphadenopathy or easy bruising Neurological:Denies numbness, tingling or new weaknesses Behavioral/Psych: Mood is stable, no new changes  All other systems were reviewed with the patient and are negative.  PHYSICAL  EXAMINATION: ECOG PERFORMANCE STATUS: 2 - Symptomatic, <50% confined to bed  Vitals:   10/26/17 2329 10/27/17 0432  BP: 114/66 108/69  Pulse: 91 86  Resp: 16 16  Temp: 97.9 F (36.6 C) (!) 97.5 F (36.4 C)  SpO2: 97% 98%   Filed Weights   10/23/17 1707  Weight: 227 lb (103 kg)    GENERAL:alert, no distress and comfortable, (+) fatigue and 20 pound weight loss in the past 2 months SKIN: skin color, texture, turgor are normal, no rashes or significant lesions EYES: normal, conjunctiva are pink and non-injected, sclera clear OROPHARYNX:no exudate, no erythema and lips, buccal mucosa, and tongue normal  NECK: supple, thyroid normal size, non-tender, without nodularity LYMPH:  no palpable lymphadenopathy in the cervical, axillary or inguinal LUNGS: clear to auscultation and percussion with normal breathing effort HEART: regular rate & rhythm and no murmurs and no lower extremity edema ABDOMEN:see HPI PSYCH: alert & oriented x 3 with fluent speech NEURO: no focal motor/sensory deficits  LABORATORY DATA:  I have reviewed the data as listed Lab Results  Component Value Date   WBC 7.6 10/27/2017   HGB 11.6 (L) 10/27/2017   HCT 34.8 (L) 10/27/2017   MCV 91.1 10/27/2017   PLT 231 10/27/2017   Recent Labs    10/25/17 0551 10/26/17 0539 10/27/17 0249  NA 129* 131* 127*  K 4.7 4.5 3.9  CL 96* 98 95*  CO2 _0 GLUCOSE 131* 143* 121*  BUN _1 CREATININE 0.38* 0.42* 0.39*  CALCIUM 8.8* 8.8* 8.6*  GFRNONAA >60 >60 >60  GFRAA >60 >60 >60  PROT 7.4 7.1 7.4  ALBUMIN 2.4* 2.3* 2.3*  AST 214* 203* 134*  ALT 197* 194* 166*  ALKPHOS 786* 762* 721*  BILITOT 20.4* 19.5* 12.6*  BILIDIR 13.4*  --  7.7*  IBILI 7.0*  --  4.9*    RADIOGRAPHIC STUDIES: I have personally reviewed the radiological images as listed and agreed with the findings in the report. US Abdomen Complete  Result Date: 10/23/2017 CLINICAL DATA:  Jaundiced. EXAM: ABDOMEN ULTRASOUND COMPLETE COMPARISON:   Body CT 02/24/2017 FINDINGS: Gallbladder: No gallstones seen. There is gallbladder wall thickening measuring 6 mm. No sonographic Murphy sign noted by sonographer. Common bile duct: Diameter: 6.3 mm Liver: Coarse nodular echogenicity of the liver. Area of heterogeneity or an ill-defined mass in segment 6 of the liver measures 2.6 x 2.4 x 2.2 cm. Portal vein is patent on color Doppler imaging with normal direction of blood flow towards the liver. IVC: No abnormality visualized. Pancreas: Visualized portion unremarkable. Spleen: Splenomegaly. Splenic length is 16.2 cm with volume of 994 cubic cm. Right Kidney: Length: 14.8 cm. Echogenicity within normal limits. No mass or hydronephrosis visualized. Left Kidney: Length: 14.7 cm. Echogenicity within normal limits. No mass or hydronephrosis visualized. Abdominal aorta: No aneurysm visualized. Other findings: None. IMPRESSION: Cirrhotic appearance of the liver. 2.6 cm questionable ill-defined mass versus area of focal parenchymal heterogeneity in segment 6 of the liver. If further imaging evaluation is desired, liver protocol abdominal MRI should be considered. Splenomegaly. Apparent thickening of the gallbladder wall, without evidence of cholelithiasis, and with negative sonographic Murphy's sign. The findings are favored to represent gallbladder wall edema secondary to systemic  causes such as hyperproteinemia rather than acute cholecystitis. Please correlate clinically. Electronically Signed   By: Fidela Salisbury M.D.   On: 10/23/2017 20:58   Mr 3d Recon At Scanner  Result Date: 10/25/2017 CLINICAL DATA:  Evaluate liver lesion. EXAM: MRI ABDOMEN WITHOUT AND WITH CONTRAST (INCLUDING MRCP) TECHNIQUE: Multiplanar multisequence MR imaging of the abdomen was performed both before and after the administration of intravenous contrast. Heavily T2-weighted images of the biliary and pancreatic ducts were obtained, and three-dimensional MRCP images were rendered by post  processing. CONTRAST:  10 cc Gadavist COMPARISON:  CT AP 02/24/2017. FINDINGS: Lower chest: Bilateral airspace consolidation identified. Hepatobiliary: The liver is cirrhotic. There is a diffuse nodular contour with hypertrophy of the caudate lobe and lateral segment of left lobe. There has been interval development of multiple suspicious enhancing liver lesions involving both lobes. -index lesion within the anterior aspect of segment 7 measures 3.6 cm, image 30/26. -Index lesion within segment 6 measures 2.4 cm, image 63/26. -index lesion within segment 2 measures 1.3 cm, image 45/28. -index lesion is within segment 4B measures 4.3 cm, image 45/26. There is associated tumor thrombus involving the left branch of the portal vein, image 39/28. Additionally, this mass obstructs the right medial hepatic duct and left main hepatic duct. The right lateral (posterior) duct appears mildly dilated and may be partially obstructed. Mild gallbladder wall edema and pericholecystic fluid. No common bile duct dilatation. Pancreas: No mass, inflammatory changes, or other parenchymal abnormality identified. Spleen:  Splenomegaly. Adrenals/Urinary Tract: New bilateral adrenal lesions compatible with metastatic disease. Right adrenal nodule measures 4 cm, image 49/32. The left adrenal metastasis measures 1.7 cm, image 56/32. Unremarkable appearance of the kidneys. Stomach/Bowel: Visualized portions within the abdomen are unremarkable. Vascular/Lymphatic: Aortic atherosclerosis. No aneurysm. The main portal vein appears patent. Interval development of retroperitoneal adenopathy. Index right retrocaval node measures 1.7 cm, image 79/28. Small distal esophageal and perigastric varices. Other:  Mild perihepatic ascites. Musculoskeletal: No suspicious bone lesions identified. IMPRESSION: 1. Advanced changes of cirrhosis. Interval development of multifocal enhancing liver lesions, which is concerning for multicentric hepatocellular  carcinoma involving both lobes of liver. 2. Lesion within segment 4 B obstructs the right medial hepatic duct and left main duct resulting in moderate to marked biliary ductal dilatation within the left lobe and dome of liver. There may be partial obstruction of the right lateral duct with mild dilatation. 3. Tumor thrombus within the left branch of the portal vein likely secondary to segment 4 B tumor. 4. Bilateral adrenal metastasis and retroperitoneal nodal metastasis. 5. Stigmata of portal venous hypertension including varices, ascites and splenomegaly. 6. These results will be called to the ordering clinician or representative by the Radiologist Assistant, and communication documented in the PACS or zVision Dashboard. Electronically Signed   By: Kerby Moors M.D.   On: 10/25/2017 13:31   Mr Abdomen Mrcp Moise Boring Contast  Result Date: 10/25/2017 CLINICAL DATA:  Evaluate liver lesion. EXAM: MRI ABDOMEN WITHOUT AND WITH CONTRAST (INCLUDING MRCP) TECHNIQUE: Multiplanar multisequence MR imaging of the abdomen was performed both before and after the administration of intravenous contrast. Heavily T2-weighted images of the biliary and pancreatic ducts were obtained, and three-dimensional MRCP images were rendered by post processing. CONTRAST:  10 cc Gadavist COMPARISON:  CT AP 02/24/2017. FINDINGS: Lower chest: Bilateral airspace consolidation identified. Hepatobiliary: The liver is cirrhotic. There is a diffuse nodular contour with hypertrophy of the caudate lobe and lateral segment of left lobe. There has been interval development of multiple suspicious  enhancing liver lesions involving both lobes. -index lesion within the anterior aspect of segment 7 measures 3.6 cm, image 30/26. -Index lesion within segment 6 measures 2.4 cm, image 63/26. -index lesion within segment 2 measures 1.3 cm, image 45/28. -index lesion is within segment 4B measures 4.3 cm, image 45/26. There is associated tumor thrombus involving the  left branch of the portal vein, image 39/28. Additionally, this mass obstructs the right medial hepatic duct and left main hepatic duct. The right lateral (posterior) duct appears mildly dilated and may be partially obstructed. Mild gallbladder wall edema and pericholecystic fluid. No common bile duct dilatation. Pancreas: No mass, inflammatory changes, or other parenchymal abnormality identified. Spleen:  Splenomegaly. Adrenals/Urinary Tract: New bilateral adrenal lesions compatible with metastatic disease. Right adrenal nodule measures 4 cm, image 49/32. The left adrenal metastasis measures 1.7 cm, image 56/32. Unremarkable appearance of the kidneys. Stomach/Bowel: Visualized portions within the abdomen are unremarkable. Vascular/Lymphatic: Aortic atherosclerosis. No aneurysm. The main portal vein appears patent. Interval development of retroperitoneal adenopathy. Index right retrocaval node measures 1.7 cm, image 79/28. Small distal esophageal and perigastric varices. Other:  Mild perihepatic ascites. Musculoskeletal: No suspicious bone lesions identified. IMPRESSION: 1. Advanced changes of cirrhosis. Interval development of multifocal enhancing liver lesions, which is concerning for multicentric hepatocellular carcinoma involving both lobes of liver. 2. Lesion within segment 4 B obstructs the right medial hepatic duct and left main duct resulting in moderate to marked biliary ductal dilatation within the left lobe and dome of liver. There may be partial obstruction of the right lateral duct with mild dilatation. 3. Tumor thrombus within the left branch of the portal vein likely secondary to segment 4 B tumor. 4. Bilateral adrenal metastasis and retroperitoneal nodal metastasis. 5. Stigmata of portal venous hypertension including varices, ascites and splenomegaly. 6. These results will be called to the ordering clinician or representative by the Radiologist Assistant, and communication documented in the PACS or  zVision Dashboard. Electronically Signed   By: Kerby Moors M.D.   On: 10/25/2017 13:31   Ir Int Lianne Cure Biliary Drain With Cholangiogram  Result Date: 10/26/2017 INDICATION: 62 year old male with a history of cirrhosis, presumed HCC, portal vein thrombosis, and jaundice with evidence of obstructed biliary system at the liver hilum. EXAM: IMAGE GUIDED PERCUTANEOUS TRANSHEPATIC CHOLANGIOGRAM WITH INTERNAL/EXTERNAL BILIARY DRAINAGE MEDICATIONS: Ciprofloxacin 400 mg IV; The antibiotic was administered within an appropriate time frame prior to the initiation of the procedure. 4 MG IV ZOFRAN ANESTHESIA/SEDATION: Moderate (conscious) sedation was employed during this procedure. A total of Versed 4.0 mg and Fentanyl 100 mcg was administered intravenously. Moderate Sedation Time: 17 MINUTES minutes. The patient's level of consciousness and vital signs were monitored continuously by radiology nursing throughout the procedure under my direct supervision. FLUOROSCOPY TIME:  Fluoroscopy Time: 3 minutes 18 seconds (175 mGy). COMPLICATIONS: None PROCEDURE: The procedure, risks, benefits, and alternatives were explained to the patient and the patient's family. A complete informed consent was performed, with risk benefit analysis. Specific risks that were discussed for the procedure include bleeding, infection, biliary sepsis, IC use day, organ injury, need for further procedure, need for further surgery, long-term drain placement, cardiopulmonary collapse, death. Questions regarding the procedure were encouraged and answered. The patient understands and consents to the procedure. Patient is position in supine position on the fluoroscopy table, and the upper abdomen was prepped and draped in the usual sterile fashion. Maximum barrier sterile technique with sterile gowns and gloves were used for the procedure. A timeout was performed prior to  the initiation of the procedure. Local anesthesia was provided with 1% lidocaine with  epinephrine. Ultrasound survey of the left liver lobe was performed, with then ultrasound of the right liver lobe. 1% lidocaine was used for local anesthesia, with generous infiltration of the skin and subcutaneous tissues in and inter left costal location. Formal PTC puncture of the right liver was performed from intercostal approach. Once the tip of the needle was confirmed within the biliary system by observing for spontaneous bile return, and injecting small aliquots of contrast, images were stored of the biliary system after partially opacifying the biliary tree via the needle. A second access was required for access into the biliary system, from similar right-sided intercostal location. Once the tip of this second needle was confirmed within the biliary system, an 018 wire was advanced centrally. The needle was removed, a small incision was made with an 11 blade scalpel, and then a triaxial Accustick system was advanced into the biliary system. The metal stiffener and dilator were removed, we confirmed placement with contrast infusion. A coaxial Glidewire and 4 French glide cath were then used to navigate across the obstruction at the hilum of the liver. Once the catheter was presumed to be within the duodenum, the wire was removed and contrast confirmed location. A stiff Amplatz wire was advanced through the system, and the Accustick and Glidewire were removed. Dilation of the subcutaneous tissue tracks was performed with an 8 [place] dilator, and then a 10 [place] biliary drain was placed as an internal/external biliary drain. Small amount of contrast confirmed location. The patient remained hemodynamically stable throughout. Placement of the drain resulted in significant nausea. We elected to defer the left-sided drainage at this time. No complications were encountered and no significant blood loss was encountered. IMPRESSION: Status post formal PTC and internal/external biliary drainage. Signed, Dulcy Fanny.  Dellia Nims, RPVI [facility] Radiology Electronically Signed   By: Corrie Mckusick D.O.   On: 10/26/2017 14:45    ASSESSMENT & PLAN: 62 year old Caucasian male, with past medical history ofstage II descending colon cancer in 2007, that post left hemicolectomy, recurrent perianal stool and abscess, was last surgery in September 2019,  multiple substance abuse including alcohol, liver cirrhosis from alcohol and Matthew Lewis, presented with abdominal pain and jaundice for a few weeks, and 20 pound weight loss in the past few months.  He had 5-pack-year history of smoking when he was young.  1.  Multiple liver masses, adrenal mets, retroperitoneal adenopathy, highly suspicious for metastatic cancer, possible liver primary  2.  Obstructive jaundice secondary to #1 3.  Liver cirrhosis secondary to alcohol and NASH 4. Portal vein thrombosis 5. DM 6. HTN   Recommendations: -I have reviewed his abdominal MRI findings with patient in detail, this is highly suspicious for metastatic answer, especially hepatocellular carcinoma. However given the normal AFP, his image findings are not diagnostic for [place]. Other primary tumor, such as colon or lung are also possible.  -please obtain a CT CHEST wo contrast today  -I recommend IR biopsy of the adrenal lesion to establish cancer diagnosis, and to confirm metastasis, and the cancer origin. Pt is agreeable with biopsy  -He may need a colonoscopy as outpatient, he is due in January 2020.  Given his multiple recurrent perianal abscess and fistula, rectal or anal cancer needs to be ruled out also. Hopefully the biopsy may rule it out  -We discussed that his malignancy is likely incurable, due to the metastatic disease, certainly  treatable.  Patient is interested in treatment -I will see him back in my clinic next week to review the biopsy results, and discussed the treatment plan. -I discussed the above with Dr. Quincy Simmonds this morning    All questions were answered. The patient knows to call the clinic with any problems, questions or concerns.      [name], MD 10/27/2017 8:21 AM

## 2017-10-27 NOTE — Progress Notes (Signed)
PA paged by RN for bloody output from drain after returning from CT today.   Discussed with Dr. Kathlene Cote. Some bloody, bilious output may be expected after drain placement yesterday.  Continue to flush as instructed and monitor.  Patient's drain is visible on unenhanced CT Chest obtained today and appears well-positioned.   Per RN, very little output in the past 1 hr. If more bleeding occurs, Dr. Kathlene Cote would recommend CT Abdomen/Pelvis with IV contrast, multiphase imaging of liver.   [name], MS RD PA-C 1:45 PM

## 2017-10-27 NOTE — Progress Notes (Signed)
ANTICOAGULATION CONSULT NOTE   Pharmacy Consult for heparin Indication: portal vein thrombosis  Allergies  Allergen Reactions  . Morphine And Related Other (See Comments)    PATIENT IS RECOVERING FROM DRUG ADDICTION PATIENT WANTS TO AVOID ANY NARCOTICS  . Other Other (See Comments)    PATIENT IS RECOVERING FROM DRUG ADDICTION PATIENT WANTS TO AVOID ANY NARCOTICS   Patient Measurements: Height: 5' 10.5" (179.1 cm) Weight: 227 lb (103 kg) IBW/kg (Calculated) : 74.15 Heparin Dosing Weight: 96 kg  Vital Signs: Temp: 97.5 F (36.4 C) (10/21 0432) Temp Source: Oral (10/21 0432) BP: 110/69 (10/21 1045) Pulse Rate: 86 (10/21 0432)  Labs: Recent Labs    10/25/17 0551  10/26/17 0539 10/27/17 0249 10/27/17 1008  HGB 12.2*  --  11.9* 11.6*  --   HCT 36.9*  --  36.1* 34.8*  --   PLT 225  --  198 231  --   LABPROT 14.6  --  14.3  --   --   INR 1.15  --  1.12  --   --   HEPARINUNFRC  --    < > <0.10* 0.10* <0.10*  CREATININE 0.38*  --  0.42* 0.39*  --    < > = values in this interval not displayed.   Estimated Creatinine Clearance: 113.1 mL/min (A) (by C-G formula based on SCr of 0.39 mg/dL (L)).  Medical History: Past Medical History:  Diagnosis Date  . Abscess of anal and rectal regions    horse shoe abscess  . Alcoholic cirrhosis ([place])   . Arthritis    "everywhere; especially in my spine"  . Cirrhosis ([place])    STAGE 1 CIRRHOSIS PATIENT SEES DR NAMDIGAM FOR  . Colon cancer ([place]) 05/2005   TX SURGERY WITH LYMPH NODE REMOVAL  . Cough    LAST FEW WEEKS SAW DR RAMOS LUNGS MILKY SPUTUM OCC  . DM type 2 with diabetic peripheral neuropathy (HCC)    left foot  . Fatigue   . Fistula, anal    multiple  . History of substance abuse ([place])    last alcohol was 08/2011; last marijuania was 08/2011  . Hypertension   . PTSD (post-traumatic stress disorder)    "severe" HX OF  . Sleep deprivation    Assessment: Patient is a 62 y.o M with hx EtOH abuse, cirrhosis and colon cancer  presented to the ED on 10/23/17 with jaundice.  LFTS and Tbili elevated on admission. INR wnl. MRCP on 10/19 showed portal vein thrombosis. To start heparin drip on 10/19 for thrombosis.  Using conservative dosing for liver dysfunction.  Lovenox 40 mg SQ dose given on 10/18 at 1129.  10/27/2017:  HL at remains < 0.10 units/ml despite rate increases.    No infusion or bleeding issues per RN.   CBC: Hg low but stable, pltc WNL  Elevated LFTs trending down  Planning IR biopsy of metastatic adrenal lesion  Goal of Therapy:  Heparin level 0.3-0.7 units/ml Monitor platelets by anticoagulation protocol: Yes   Plan:   Increase Heparin drip to 2200 units/hr  Repeat heparin level in 6 hours  Daily CBC/HL  F/U plans for biopsy & need to hold heparin peri-procedure  Biagio Borg 10/27/2017, 10:55 AM

## 2017-10-27 NOTE — Progress Notes (Signed)
Pt.is a recovered frpm alcohol addiction.Pt. was avioding any Narcotic pain medications .Pt. Asked  The RN by stating that his pain is unbearable and  he wanted to take stronger  pain medication to stop the surgical pain.This RN contacted the provider .

## 2017-10-27 NOTE — Progress Notes (Signed)
Biliary drain was flushed at 2040 with 8ml NS & aspirated 64ml back as there was clotting in the drain tube with NO drainage since the last flush approx 1430 today. Pt had less pain with this flush that noted with previous flush but states it does cause pin in his chest when flushed. The drain immediately began draining & I have just emptied the drainage of 154ml blood that has formed a large clot in the container.

## 2017-10-27 NOTE — Progress Notes (Signed)
Patient returned from CT and this RN noticed that drainage bag was almost full. Drainage was thick and brownish-red. 478ml. This RN returned about 30 mins later drainage was red and thinner than before. Emptied out 157ml at that time. Paged attending and [name] PA from IR to make aware. Was told to monitor drainage output for an hour.

## 2017-10-27 NOTE — Progress Notes (Addendum)
ANTICOAGULATION CONSULT NOTE   Pharmacy Consult for heparin Indication: portal vein thrombosis  Allergies  Allergen Reactions  . Morphine And Related Other (See Comments)    PATIENT IS RECOVERING FROM DRUG ADDICTION PATIENT WANTS TO AVOID ANY NARCOTICS  . Other Other (See Comments)    PATIENT IS RECOVERING FROM DRUG ADDICTION PATIENT WANTS TO AVOID ANY NARCOTICS   Patient Measurements: Height: 5' 10.5" (179.1 cm) Weight: 227 lb (103 kg) IBW/kg (Calculated) : 74.15 Heparin Dosing Weight: 95 kg  Vital Signs: Temp: 98.3 F (36.8 C) (10/21 1317) Temp Source: Oral (10/21 1317) BP: 107/68 (10/21 1317) Pulse Rate: 90 (10/21 1317)  Labs: Recent Labs    10/25/17 0551  10/26/17 0539 10/27/17 0249 10/27/17 1008 10/27/17 1817  HGB 12.2*  --  11.9* 11.6*  --   --   HCT 36.9*  --  36.1* 34.8*  --   --   PLT 225  --  198 231  --   --   LABPROT 14.6  --  14.3  --   --   --   INR 1.15  --  1.12  --   --   --   HEPARINUNFRC  --    < > <0.10* 0.10* <0.10* 0.23*  CREATININE 0.38*  --  0.42* 0.39*  --   --    < > = values in this interval not displayed.   Estimated Creatinine Clearance: 113.1 mL/min (A) (by C-G formula based on SCr of 0.39 mg/dL (L)).  Medical History: Past Medical History:  Diagnosis Date  . Abscess of anal and rectal regions    horse shoe abscess  . Alcoholic cirrhosis ([place])   . Arthritis    "everywhere; especially in my spine"  . Cirrhosis ([place])    STAGE 1 CIRRHOSIS PATIENT SEES DR NAMDIGAM FOR  . Colon cancer ([place]) 05/2005   TX SURGERY WITH LYMPH NODE REMOVAL  . Cough    LAST FEW WEEKS SAW DR RAMOS LUNGS MILKY SPUTUM OCC  . DM type 2 with diabetic peripheral neuropathy (HCC)    left foot  . Fatigue   . Fistula, anal    multiple  . History of substance abuse ([place])    last alcohol was 08/2011; last marijuania was 08/2011  . Hypertension   . PTSD (post-traumatic stress disorder)    "severe" HX OF  . Sleep deprivation    Assessment: Patient is a 62  y.o M with hx EtOH abuse, cirrhosis and colon cancer presented to the ED on 10/23/17 with jaundice.  LFTS and Tbili elevated on admission. INR wnl. MRCP on 10/19 showed portal vein thrombosis. To start heparin drip on 10/19 for thrombosis.  Using conservative dosing for liver dysfunction.  Lovenox 40 mg SQ dose given on 10/18 at 1129.  10/27/2017, 10/27/17  HL = 0.23 has increased but remains subtherapeutic on heparin infusion of 2200 units/hr  Per RN, no interruptions/issues with heparin infusion.   RN reports some blood from biliary drain earlier today. Per RN, IR provider was informed. Per IR note, some hemobilia can be normal after percutaneous biliary drainage. No additional issues noted.  CBC: Hg low but stable, pltc WNL  Elevated LFTs trending down  Planning for possible IR biopsy of metastatic adrenal lesion  Goal of Therapy:  Heparin level 0.3-0.7 units/ml Monitor platelets by anticoagulation protocol: Yes   Plan:   Increase Heparin drip to 2350 units/hr. No bolus at this time given some blood in drainage.  Repeat heparin level in 6  hours  Daily CBC/HL  F/U plans for biopsy & need to hold heparin peri-procedure  Lenis Noon, PharmD Clinical Pharmacist 10/27/2017, 7:08 PM  Addendum: -Increased heparin drip to 2400 units/hr.  Lenis Noon, PharmD 10/27/17 7:33 PM

## 2017-10-28 LAB — HEPATIC FUNCTION PANEL
ALBUMIN: 2.3 g/dL — AB (ref 3.5–5.0)
ALK PHOS: 546 U/L — AB (ref 38–126)
ALT: 122 U/L — AB (ref 0–44)
AST: 80 U/L — AB (ref 15–41)
Bilirubin, Direct: 5.2 mg/dL — ABNORMAL HIGH (ref 0.0–0.2)
Indirect Bilirubin: 4.1 mg/dL — ABNORMAL HIGH (ref 0.3–0.9)
TOTAL PROTEIN: 7.2 g/dL (ref 6.5–8.1)
Total Bilirubin: 9.3 mg/dL — ABNORMAL HIGH (ref 0.3–1.2)

## 2017-10-28 LAB — CBC
HCT: 31.6 % — ABNORMAL LOW (ref 39.0–52.0)
Hemoglobin: 10.6 g/dL — ABNORMAL LOW (ref 13.0–17.0)
MCH: 30.9 pg (ref 26.0–34.0)
MCHC: 33.5 g/dL (ref 30.0–36.0)
MCV: 92.1 fL (ref 80.0–100.0)
NRBC: 0 % (ref 0.0–0.2)
Platelets: 245 10*3/uL (ref 150–400)
RBC: 3.43 MIL/uL — AB (ref 4.22–5.81)
RDW: 18.2 % — ABNORMAL HIGH (ref 11.5–15.5)
WBC: 8.1 10*3/uL (ref 4.0–10.5)

## 2017-10-28 LAB — BASIC METABOLIC PANEL
Anion gap: 6 (ref 5–15)
BUN: 7 mg/dL — AB (ref 8–23)
CALCIUM: 8.4 mg/dL — AB (ref 8.9–10.3)
CHLORIDE: 95 mmol/L — AB (ref 98–111)
CO2: 26 mmol/L (ref 22–32)
Creatinine, Ser: 0.52 mg/dL — ABNORMAL LOW (ref 0.61–1.24)
GFR calc Af Amer: >60 mL/min (ref >60)
Glucose, Bld: 150 mg/dL — ABNORMAL HIGH (ref 70–99)
POTASSIUM: 4.3 mmol/L (ref 3.5–5.1)
SODIUM: 127 mmol/L — AB (ref 135–145)

## 2017-10-28 LAB — HEPARIN LEVEL (UNFRACTIONATED)
Heparin Unfractionated: 0.5 IU/mL (ref 0.30–0.70)
Heparin Unfractionated: 0.65 IU/mL (ref 0.30–0.70)

## 2017-10-28 LAB — GLUCOSE, CAPILLARY
GLUCOSE-CAPILLARY: 133 mg/dL — AB (ref 70–99)
Glucose-Capillary: 128 mg/dL — ABNORMAL HIGH (ref 70–99)
Glucose-Capillary: 134 mg/dL — ABNORMAL HIGH (ref 70–99)
Glucose-Capillary: 139 mg/dL — ABNORMAL HIGH (ref 70–99)

## 2017-10-28 LAB — MAGNESIUM: MAGNESIUM: 2.1 mg/dL (ref 1.7–2.4)

## 2017-10-28 LAB — HEMOGLOBIN A1C
Hgb A1c MFr Bld: 6.5 % — ABNORMAL HIGH (ref 4.8–5.6)
Mean Plasma Glucose: 140 mg/dL

## 2017-10-28 MED ORDER — HYDROCODONE-ACETAMINOPHEN 5-325 MG PO TABS: 1.0000 | ORAL_TABLET | Freq: Four times a day (QID) | ORAL | Status: DC | PRN

## 2017-10-28 MED ORDER — OXYCODONE HCL 5 MG PO TABS
10.0000 mg | ORAL_TABLET | ORAL | Status: DC | PRN
  Administered 2017-10-28 – 2017-10-29 (×5): 10 mg via ORAL
  Filled 2017-10-28 (×6): qty 2

## 2017-10-28 MED ORDER — MORPHINE SULFATE (PF) 4 MG/ML IV SOLN
4.0000 mg | Freq: Once | INTRAVENOUS | Status: AC
  Administered 2017-10-28: 4 mg via INTRAVENOUS
  Filled 2017-10-28: qty 1

## 2017-10-28 NOTE — Progress Notes (Signed)
ANTICOAGULATION CONSULT NOTE - Follow Up Consult  Pharmacy Consult for heparin Indication: portal vein thrombosis  Allergies  Allergen Reactions  . Morphine And Related Other (See Comments)    PATIENT IS RECOVERING FROM DRUG ADDICTION PATIENT WANTS TO AVOID ANY NARCOTICS  . Other Other (See Comments)    PATIENT IS RECOVERING FROM DRUG ADDICTION PATIENT WANTS TO AVOID ANY NARCOTICS    Patient Measurements: Height: 5' 10.5" (179.1 cm) Weight: 227 lb (103 kg) IBW/kg (Calculated) : 74.15 Heparin Dosing Weight:   Vital Signs: Temp: 97.7 F (36.5 C) (10/21 2143) Temp Source: Oral (10/21 2143) BP: 104/62 (10/21 2143) Pulse Rate: 87 (10/21 2143)  Labs: Recent Labs    10/26/17 0539 10/27/17 0249 10/27/17 1008 10/27/17 1817 10/28/17 0221  HGB 11.9* 11.6*  --   --  10.6*  HCT 36.1* 34.8*  --   --  31.6*  PLT 198 231  --   --  245  LABPROT 14.3  --   --   --   --   INR 1.12  --   --   --   --   HEPARINUNFRC <0.10* 0.10* <0.10* 0.23* 0.50  CREATININE 0.42* 0.39*  --   --  0.52*    Estimated Creatinine Clearance: 113.1 mL/min (A) (by C-G formula based on SCr of 0.52 mg/dL (L)).   Medications:  Infusions:  . heparin 2,400 Units/hr (10/27/17 2024)    Assessment: Patient with heparin level at goal.  RN called about 2300 about clot in drain tube.  Thanked Therapist, sports for information and asked she provide update after her planned contact with provider.   No noted change in therapy.  Goal of Therapy:  Heparin level 0.3-0.7 units/ml Monitor platelets by anticoagulation protocol: Yes   Plan:  Continue heparin drip at current rate Recheck level at 0900  Tyler Deis, Shea Stakes [place] 10/28/2017,6:03 AM

## 2017-10-28 NOTE — Progress Notes (Signed)
ANTICOAGULATION CONSULT NOTE   Pharmacy Consult for heparin Indication: portal vein thrombosis  Allergies  Allergen Reactions  . Morphine And Related Other (See Comments)    PATIENT IS RECOVERING FROM DRUG ADDICTION PATIENT WANTS TO AVOID ANY NARCOTICS  . Other Other (See Comments)    PATIENT IS RECOVERING FROM DRUG ADDICTION PATIENT WANTS TO AVOID ANY NARCOTICS   Patient Measurements: Height: 5' 10.5" (179.1 cm) Weight: 227 lb (103 kg) IBW/kg (Calculated) : 74.15 Heparin Dosing Weight: 96 kg  Vital Signs: Temp: 97.8 F (36.6 C) (10/22 0607) Temp Source: Oral (10/22 0607) BP: 106/73 (10/22 0607) Pulse Rate: 104 (10/22 0607)  Labs: Recent Labs    10/26/17 0539 10/27/17 0249  10/27/17 1817 10/28/17 0221 10/28/17 0919  HGB 11.9* 11.6*  --   --  10.6*  --   HCT 36.1* 34.8*  --   --  31.6*  --   PLT 198 231  --   --  245  --   LABPROT 14.3  --   --   --   --   --   INR 1.12  --   --   --   --   --   HEPARINUNFRC <0.10* 0.10*   < > 0.23* 0.50 0.65  CREATININE 0.42* 0.39*  --   --  0.52*  --    < > = values in this interval not displayed.   Estimated Creatinine Clearance: 113.1 mL/min (A) (by C-G formula based on SCr of 0.52 mg/dL (L)).  Medical History: Past Medical History:  Diagnosis Date  . Abscess of anal and rectal regions    horse shoe abscess  . Alcoholic cirrhosis ([place])   . Arthritis    "everywhere; especially in my spine"  . Cirrhosis ([place])    STAGE 1 CIRRHOSIS PATIENT SEES DR NAMDIGAM FOR  . Colon cancer ([place]) 05/2005   TX SURGERY WITH LYMPH NODE REMOVAL  . Cough    LAST FEW WEEKS SAW DR RAMOS LUNGS MILKY SPUTUM OCC  . DM type 2 with diabetic peripheral neuropathy (HCC)    left foot  . Fatigue   . Fistula, anal    multiple  . History of substance abuse ([place])    last alcohol was 08/2011; last marijuania was 08/2011  . Hypertension   . PTSD (post-traumatic stress disorder)    "severe" HX OF  . Sleep deprivation    Assessment: Patient is a 62 y.o  M with hx EtOH abuse, cirrhosis and colon cancer presented to the ED on 10/23/17 with jaundice.  LFTS and Tbili elevated on admission. INR wnl. MRCP on 10/19 showed portal vein thrombosis. To start heparin drip on 10/19 for thrombosis.  Using conservative dosing for liver dysfunction.  Lovenox 40 mg SQ dose given on 10/18 at 1129.  10/28/2017:  HL now therapeutic (0.65) on 2400 units/ml     No infusion issues per RN.   + bloody biliary drainage  CBC: Hg low & trending down, pltc WNL  Elevated LFTs trending down  Planning IR biopsy of metastatic adrenal lesion  Goal of Therapy:  Heparin level 0.3-0.7 units/ml Monitor platelets by anticoagulation protocol: Yes   Plan:   Hold heparin for now secondary to bleeding & planned biopsy  Daily CBC  F/U plans for [facility] post-op  Biagio Borg 10/28/2017, 10:12 AM

## 2017-10-28 NOTE — Progress Notes (Addendum)
Pt has increased pain to rt side ( drain insertion site), scant drainage since biliary drain last emptied.disconnected tube from drain and a clot was visible in the tubing. Flushed with 5cc NS and pt experienced pain in rt side & chest  2 pain pills given

## 2017-10-28 NOTE — Progress Notes (Signed)
PROGRESS NOTE [facility]   Matthew Lewis   OIZ:[id number] DOB: 1955-05-18  DOA: 10/23/2017 PCP: [name], MD   Brief Narrative:  Matthew Lewis is a 62 year old male with medical history significant for diabetes mellitus type 2, hypertension, alcoholic cirrhosis and history of colon cancer in remission.  Patient presented to the emergency department after being sent from primary care physician for abnormal LFTs.  Patient has been having diarrhea for 2 weeks prior to admission with weight loss and jaundice.  Patient was seen by PCP and started on empiric vancomycin for C. difficile and lab work-up revealed elevated liver enzymes with severely elevated T bili.  Patient admitted with working diagnosis of jaundice. GI was consulted.   Subjective: Patient seen and examined, complaining of abdominal pain last night.  Drain in place however bloody with no biliary material noted.  Nursing staff had a bloody bowel movement, this has resolved.  Denies nausea and vomiting.    Assessment & Plan: Obstructive jaundice  Multifocal masses concerning for HCC and tumor invading the left portal vein with portal vein thrombosis.  Mass is obstructing the biliary tree. GI unable to perform ERCP, s/p percutaneous drain. Continue to monitor LFTs and INR.  LFTs sensitivities trending down.  Portal vein thrombosis Will hold heparin drip for now as patient has been bleeding through percutaneous bilious drain.  Also one episode of bloody bowel movement.  Case discussed with oncology and anticoagulation therapy can be discussed after patient complete procedures and more stable.  None need for emergent treatment at this time.  ? [place] with bilateral adrenal metastases and retroperitoneal nodal metastases/Lung mass Multiple masses, unclear which one is primary at this time.  Patient is scheduled for a liver biopsy in a.m. AFP 1.3, CEA difficulty elevated 1,16. ?  Recurrence of colon cancer. ?  Will  need CT head, will defer to oncology for further work-up.  Hx of cirrhosis  [place] consistent with cirrhotic pattern, ammonia levels mild elevated, continue low-dose lactulose BID. Management per GI   HTN  BP stable, continue lisinopril  DM type 2  CBGs stable during hospital stay, continue with amiodarone at reduced dose and SSI.  A1c pending.  Diarrhea - resolved Prior to admission was treated with empiric oral vanc for presumed C-diff. Vancomycin has been stopped, patient currently denies diarrhea, if recurs will get stool sample. Continue to monitor      DVT prophylaxis: Lovenox Code Status: Full code Family Communication: Family at bedside Disposition Plan: Home 24-48 hr after liver biopsy and cleared by onc  Consultants:   GI  Oncology   IR   Procedures:     Antimicrobials: Anti-infectives (From admission, onward)   Start     Dose/Rate Route Frequency Ordered Stop   10/26/17 1215  ciprofloxacin (CIPRO) IVPB 400 mg     400 mg 200 mL/hr over 60 Minutes Intravenous On call 10/26/17 1202 10/26/17 1346         Objective: Vitals:   10/27/17 1317 10/27/17 2143 10/28/17 0607 10/28/17 1405  BP: 107/68 104/62 106/73 118/71  Pulse: 90 87 (!) 104 95  Resp: 20 20 16 14   Temp: 98.3 F (36.8 C) 97.7 F (36.5 C) 97.8 F (36.6 C) 98.5 F (36.9 C)  TempSrc: Oral Oral Oral Oral  SpO2: 99% 99% 98% 99%  Weight:      Height:        Intake/Output Summary (Last 24 hours) at 10/28/2017 1550 Last data filed at 10/28/2017 1453 Gross per  24 hour  Intake 260 ml  Output 385 ml  Net -125 ml   Filed Weights   10/23/17 1707  Weight: 103 kg    Examination:  General: NAD  HEENT: Scleral icterus 2+ Cardiovascular: RRR, S1/S2 +, no rubs, no gallops Respiratory: CTA bilaterally, no wheezing, no rhonchi Abdominal: Soft, NT, ND, bowel sounds + Extremities: no edema, no cyanosis Skin: Jaundice   Data Reviewed: I have personally reviewed following labs and imaging  studies  CBC: Recent Labs  Lab 10/23/17 1746 10/24/17 0549 10/25/17 0551 10/26/17 0539 10/27/17 0249 10/28/17 0221  WBC 8.5 7.7 8.2 6.7 7.6 8.1  NEUTROABS 6.4  --   --   --   --   --   HGB 13.0 12.0* 12.2* 11.9* 11.6* 10.6*  HCT 39.5 35.7* 36.9* 36.1* 34.8* 31.6*  MCV 91.6 92.2 91.8 91.6 91.1 92.1  PLT 273 204 225 198 231 149   Basic Metabolic Panel: Recent Labs  Lab 10/24/17 0549 10/25/17 0551 10/26/17 0539 10/27/17 0249 10/28/17 0221  NA 130* 129* 131* 127* 127*  K 4.2 4.7 4.5 3.9 4.3  CL 99 96* 98 95* 95*  CO2 23 25 25 24 26   GLUCOSE 112* 131* 143* 121* 150*  BUN 10 9 8 8  7*  CREATININE 0.30* 0.38* 0.42* 0.39* 0.52*  CALCIUM 8.7* 8.8* 8.8* 8.6* 8.4*  MG  --   --   --  2.2 2.1   GFR: Estimated Creatinine Clearance: 113.1 mL/min (A) (by C-G formula based on SCr of 0.52 mg/dL (L)). Liver Function Tests: Recent Labs  Lab 10/24/17 0549 10/25/17 0551 10/26/17 0539 10/27/17 0249 10/28/17 0221  AST 204* 214* 203* 134* 80*  ALT 187* 197* 194* 166* 122*  ALKPHOS 682* 786* 762* 721* 546*  BILITOT 17.2* 20.4* 19.5* 12.6* 9.3*  PROT 7.3 7.4 7.1 7.4 7.2  ALBUMIN 2.5* 2.4* 2.3* 2.3* 2.3*   Recent Labs  Lab 10/23/17 1746  LIPASE 27   Recent Labs  Lab 10/23/17 1918 10/25/17 0551 10/27/17 0249  AMMONIA 47* 32 44*   Coagulation Profile: Recent Labs  Lab 10/23/17 1918 10/25/17 0551 10/26/17 0539  INR 1.07 1.15 1.12   Cardiac Enzymes: No results for input(s): CKTOTAL, CKMB, CKMBINDEX, TROPONINI in the last 168 hours. BNP (last 3 results) No results for input(s): PROBNP in the last 8760 hours. HbA1C: Recent Labs    10/27/17 0249  HGBA1C 6.5*   CBG: Recent Labs  Lab 10/27/17 1208 10/27/17 1612 10/27/17 2145 10/28/17 0754 10/28/17 1152  GLUCAP 179* 178* 168* 133* 128*   Lipid Profile: No results for input(s): CHOL, HDL, LDLCALC, TRIG, CHOLHDL, LDLDIRECT in the last 72 hours. Thyroid Function Tests: No results for input(s): TSH, T4TOTAL,  FREET4, T3FREE, THYROIDAB in the last 72 hours. Anemia Panel: No results for input(s): VITAMINB12, FOLATE, FERRITIN, TIBC, IRON, RETICCTPCT in the last 72 hours. Sepsis Labs: No results for input(s): PROCALCITON, LATICACIDVEN in the last 168 hours.  No results found for this or any previous visit (from the past 240 hour(s)).    Radiology Studies: Ct Chest Wo Contrast  Result Date: 10/27/2017 CLINICAL DATA:  Multifocal liver lesions.  Weight loss. EXAM: CT CHEST WITHOUT CONTRAST TECHNIQUE: Multidetector CT imaging of the chest was performed following the standard protocol without IV contrast. COMPARISON:  None FINDINGS: Cardiovascular: Normal heart size. No pericardial effusion. Aortic atherosclerosis. Calcification scratch set 3 vessel coronary artery atherosclerotic calcifications. Mediastinum/Nodes: Normal appearance of the thyroid gland. The trachea appears patent and is midline. Normal appearance of  the esophagus. Subcarinal lymph node is enlarged measuring 2.1 cm. Enlarged azygos-esophageal lymph node measures 2 cm, image 28/2. Right hilar lymph node is enlarged measuring 1.8 cm, image 82/2. Lungs/Pleura: Subpleural area of masslike architectural distortion within the posterolateral right lung base is identified measuring 4.9 x 5.0 cm, image 104/2. Nodule within the left lower lobe measures 2.4 cm, image 111/7. Upper Abdomen: Cirrhotic appearing liver is again noted. There has been interval placement of percutaneous transit [facility] biliary drainage catheter. Musculoskeletal: No chest wall mass or suspicious bone lesions identified. IMPRESSION: 1. Large mass in the posterolateral right lung base and left lower lobe pulmonary nodule. Suspicious for malignancy. Cannot rule out metastatic disease from suspected multifocal hepatoma. 2. Right hilar and mediastinal adenopathy. 3.  Aortic Atherosclerosis (ICD10-I70.0). 4. Multi vessel coronary artery atherosclerotic calcifications. Electronically Signed    By: Kerby Moors M.D.   On: 10/27/2017 11:58   Ct Abdomen Pelvis W Contrast  Result Date: 10/27/2017 CLINICAL DATA:  Drain placed yesterday with little output. EXAM: CT ABDOMEN AND PELVIS WITH CONTRAST TECHNIQUE: Multidetector CT imaging of the abdomen and pelvis was performed using the standard protocol following bolus administration of intravenous contrast. CONTRAST:  130m ISOVUE-300 IOPAMIDOL (ISOVUE-300) INJECTION 61% COMPARISON:  chest CT earlier today.  Abdominal CT 02/24/2017 FINDINGS: Lower chest: Subcarinal adenopathy partially imaged on the highest image, measuring 2.2 cm in short axis diameter. Left lower lobe nodule again noted, 2.5 cm. Masslike airspace opacity noted posteriorly in the right lower lobe, stable. Trace right pleural effusion. Hepatobiliary: Cirrhotic changes within the liver. Percutaneous transhepatic biliary drain in place. The tip is in the distal common bile duct. There is mild intrahepatic biliary ductal dilatation, particularly in the left hepatic lobe which appear stable since earlier chest CT. Multifocal areas of masslike low-density noted within the liver concerning for multifocal hepatoma or metastases. Largest is noted centrally in the left hepatic lobe measuring 5.1 x 4.2 cm. 1.9 cm low-density area in the anterior right hepatic lobe on image 16. 2.5 cm low-density lesion in the right inferior hepatic lobe. Stones or sludge noted within the gallbladder. Pancreas: No focal abnormality or ductal dilatation. Spleen: No focal abnormality. Splenomegaly. Craniocaudal length measures 17 cm. Adrenals/Urinary Tract: Bilateral adrenal nodules and masses compatible with metastases, the largest in the right adrenal gland measuring 3.5 cm. No renal mass or hydronephrosis. Urinary bladder is unremarkable. Stomach/Bowel: Moderate to large stool burden in the colon with mild gaseous distention of the colon. Stomach and small bowel decompressed, unremarkable. Vascular/Lymphatic: Aorta  is normal caliber. No retroperitoneal adenopathy. Aortocaval node has short axis diameter of 2.2 cm on image 45. Aortic calcifications. Reproductive: Mildly prominent prostate. Other: No free fluid or free air. Musculoskeletal: No acute bony abnormality. Degenerative disc and facet disease throughout the lumbar spine. IMPRESSION: Changes of cirrhosis. Multiple hepatic lesions are noted concerning for multifocal hepatoma or metastases. Biliary drain is in place with the tip in the distal common bile duct. There is mild intrahepatic biliary ductal dilatation. Bilateral adrenal nodules and masses, right larger than left compatible with metastases. Retroperitoneal adenopathy. Large stool burden with mild gaseous distention of the colon. Aortic atherosclerosis. Electronically Signed   By: KRolm BaptiseM.D.   On: 10/27/2017 21:04     Scheduled Meds: . feeding supplement  1 Container Oral TID BM  . gabapentin  400 mg Oral Daily  . gabapentin  800 mg Oral QHS  . insulin aspart  0-9 Units Subcutaneous TID WC & HS  . insulin detemir  41 Units Subcutaneous Daily  . insulin detemir  6 Units Subcutaneous QHS  . lactulose  10 g Oral BID  . lisinopril  20 mg Oral Daily  . multivitamin with minerals  1 tablet Oral Daily  . protein supplement  1 packet Oral TID  . sodium chloride flush  5 mL Intracatheter Q8H   Continuous Infusions:    LOS: 5 days   Time spent: Total of 15 minutes spent with pt, greater than 50% of which was spent in discussion of  treatment, counseling and coordination of care   Chipper Oman, MD Pager: Text Page via [email or web address]   If 7PM-7AM, please contact night-coverage [email or web address] 10/28/2017, 3:50 PM   Note - This record has been created using [employer]. Chart creation errors have been sought, but may not always have been located. Such creation errors do not reflect on the standard of medical care.

## 2017-10-28 NOTE — Progress Notes (Addendum)
Patient ID: Matthew Lewis, male   DOB: [birth year], 62 y.o.   MRN: [id number]    Referring Physician(s): [place]  Supervising Physician: Daryll Brod  Patient Status:  [facility] - In-pt  Chief Complaint: Abdominal pain, jaundice   Subjective: Pt doing ok; has some RUQ soreness; denies N/V; still weak; hemobilia persists but drain output increased   Allergies: Morphine and related and Other  Medications: Prior to Admission medications   Medication Sig Start Date End Date Taking? Authorizing Provider  aspirin EC 81 MG tablet Take 81 mg by mouth daily.   Yes [provider]  atorvastatin (LIPITOR) 40 MG tablet Take 1 tablet (40 mg total) by mouth daily at 6 PM. Patient taking differently: Take 20 mg by mouth daily at 6 PM.  12/02/12  Yes Viyuoh, Adeline C, MD  calcium carbonate (TUMS - DOSED IN MG ELEMENTAL CALCIUM) 500 MG chewable tablet Chew 1 tablet by mouth 2 (two) times daily as needed for indigestion or heartburn.   Yes [provider]  celecoxib (CELEBREX) 200 MG capsule Take 1 capsule by mouth daily. 10/10/17  Yes [provider]  empagliflozin (JARDIANCE) 10 MG TABS tablet Take 10 mg by mouth at bedtime.    Yes [provider]  Fexofenadine-Pseudoephedrine (ALLEGRA-D 24 HOUR PO) Take by mouth daily. WAL/FEX   Yes [provider]  gabapentin (NEURONTIN) 400 MG capsule Take 400-800 mg by mouth 2 (two) times daily. 1 capsule am, 2 capsules pm   Yes [provider]  glipiZIDE (GLUCOTROL) 5 MG tablet Take 5 mg by mouth 2 (two) times daily before a meal.   Yes [provider]  Insulin Detemir (LEVEMIR FLEXPEN) 100 UNIT/ML Pen Inject 10-60 Units into the skin 2 (two) times daily. Take 62 units in the am and Take 10 units in the pm   Yes [provider]  lisinopril (PRINIVIL,ZESTRIL) 20 MG tablet Take 20 mg by mouth daily.   Yes [provider]  metFORMIN (GLUCOPHAGE) 1000 MG tablet Take 1,000 mg by mouth 2  (two) times daily with a meal.   Yes [provider]  Multiple Vitamin (MULTIVITAMIN) tablet Take 1 tablet by mouth daily.   Yes [provider]  polyvinyl alcohol (LIQUIFILM TEARS) 1.4 % ophthalmic solution 1 drop as needed for dry eyes.   Yes [provider]  vancomycin (VANCOCIN) 125 MG capsule Take 1 capsule by mouth every 6 (six) hours. 10/23/17  Yes [provider]     Vital Signs: BP 118/71   Pulse 95   Temp 98.5 F (36.9 C) (Oral)   Resp 14   Ht 5' 10.5" (1.791 m)   Wt 227 lb (103 kg)   SpO2 99%   BMI 32.11 kg/m   Physical Exam: Awake, alert.  Chest with slightly diminished breath sounds right base, left clear.  Heart with regular rate and rhythm.  Abdomen soft, positive bowel sounds, biliary drain intact, insertion site ok, mildly tender, output yesterday 1.1 liters bloody bile, today 300 cc; some difficulty/resistance noted with irrigation of drain(may have some clot in tubing); no sig LE edema  Imaging: Ct Chest Wo Contrast  Result Date: 10/27/2017 CLINICAL DATA:  Multifocal liver lesions.  Weight loss. EXAM: CT CHEST WITHOUT CONTRAST TECHNIQUE: Multidetector CT imaging of the chest was performed following the standard protocol without IV contrast. COMPARISON:  None FINDINGS: Cardiovascular: Normal heart size. No pericardial effusion. Aortic atherosclerosis. Calcification scratch set 3 vessel coronary artery atherosclerotic calcifications. Mediastinum/Nodes: Normal appearance of the  thyroid gland. The trachea appears patent and is midline. Normal appearance of the esophagus. Subcarinal lymph node is enlarged measuring 2.1 cm. Enlarged azygos-esophageal lymph node measures 2 cm, image 28/2. Right hilar lymph node is enlarged measuring 1.8 cm, image 82/2. Lungs/Pleura: Subpleural area of masslike architectural distortion within the posterolateral right lung base is identified measuring 4.9 x 5.0 cm, image 104/2. Nodule within the left lower lobe  measures 2.4 cm, image 111/7. Upper Abdomen: Cirrhotic appearing liver is again noted. There has been interval placement of percutaneous transit [facility] biliary drainage catheter. Musculoskeletal: No chest wall mass or suspicious bone lesions identified. IMPRESSION: 1. Large mass in the posterolateral right lung base and left lower lobe pulmonary nodule. Suspicious for malignancy. Cannot rule out metastatic disease from suspected multifocal hepatoma. 2. Right hilar and mediastinal adenopathy. 3.  Aortic Atherosclerosis (ICD10-I70.0). 4. Multi vessel coronary artery atherosclerotic calcifications. Electronically Signed   By: Kerby Moors M.D.   On: 10/27/2017 11:58   Ct Abdomen Pelvis W Contrast  Result Date: 10/27/2017 CLINICAL DATA:  Drain placed yesterday with little output. EXAM: CT ABDOMEN AND PELVIS WITH CONTRAST TECHNIQUE: Multidetector CT imaging of the abdomen and pelvis was performed using the standard protocol following bolus administration of intravenous contrast. CONTRAST:  138m ISOVUE-300 IOPAMIDOL (ISOVUE-300) INJECTION 61% COMPARISON:  chest CT earlier today.  Abdominal CT 02/24/2017 FINDINGS: Lower chest: Subcarinal adenopathy partially imaged on the highest image, measuring 2.2 cm in short axis diameter. Left lower lobe nodule again noted, 2.5 cm. Masslike airspace opacity noted posteriorly in the right lower lobe, stable. Trace right pleural effusion. Hepatobiliary: Cirrhotic changes within the liver. Percutaneous transhepatic biliary drain in place. The tip is in the distal common bile duct. There is mild intrahepatic biliary ductal dilatation, particularly in the left hepatic lobe which appear stable since earlier chest CT. Multifocal areas of masslike low-density noted within the liver concerning for multifocal hepatoma or metastases. Largest is noted centrally in the left hepatic lobe measuring 5.1 x 4.2 cm. 1.9 cm low-density area in the anterior right hepatic lobe on image 16. 2.5 cm  low-density lesion in the right inferior hepatic lobe. Stones or sludge noted within the gallbladder. Pancreas: No focal abnormality or ductal dilatation. Spleen: No focal abnormality. Splenomegaly. Craniocaudal length measures 17 cm. Adrenals/Urinary Tract: Bilateral adrenal nodules and masses compatible with metastases, the largest in the right adrenal gland measuring 3.5 cm. No renal mass or hydronephrosis. Urinary bladder is unremarkable. Stomach/Bowel: Moderate to large stool burden in the colon with mild gaseous distention of the colon. Stomach and small bowel decompressed, unremarkable. Vascular/Lymphatic: Aorta is normal caliber. No retroperitoneal adenopathy. Aortocaval node has short axis diameter of 2.2 cm on image 45. Aortic calcifications. Reproductive: Mildly prominent prostate. Other: No free fluid or free air. Musculoskeletal: No acute bony abnormality. Degenerative disc and facet disease throughout the lumbar spine. IMPRESSION: Changes of cirrhosis. Multiple hepatic lesions are noted concerning for multifocal hepatoma or metastases. Biliary drain is in place with the tip in the distal common bile duct. There is mild intrahepatic biliary ductal dilatation. Bilateral adrenal nodules and masses, right larger than left compatible with metastases. Retroperitoneal adenopathy. Large stool burden with mild gaseous distention of the colon. Aortic atherosclerosis. Electronically Signed   By: KRolm BaptiseM.D.   On: 10/27/2017 21:04   Mr 3d Recon At Scanner  Result Date: 10/25/2017 CLINICAL DATA:  Evaluate liver lesion. EXAM: MRI ABDOMEN WITHOUT AND WITH CONTRAST (INCLUDING MRCP) TECHNIQUE: Multiplanar multisequence MR imaging of the abdomen was  performed both before and after the administration of intravenous contrast. Heavily T2-weighted images of the biliary and pancreatic ducts were obtained, and three-dimensional MRCP images were rendered by post processing. CONTRAST:  10 cc Gadavist COMPARISON:  CT  AP 02/24/2017. FINDINGS: Lower chest: Bilateral airspace consolidation identified. Hepatobiliary: The liver is cirrhotic. There is a diffuse nodular contour with hypertrophy of the caudate lobe and lateral segment of left lobe. There has been interval development of multiple suspicious enhancing liver lesions involving both lobes. -index lesion within the anterior aspect of segment 7 measures 3.6 cm, image 30/26. -Index lesion within segment 6 measures 2.4 cm, image 63/26. -index lesion within segment 2 measures 1.3 cm, image 45/28. -index lesion is within segment 4B measures 4.3 cm, image 45/26. There is associated tumor thrombus involving the left branch of the portal vein, image 39/28. Additionally, this mass obstructs the right medial hepatic duct and left main hepatic duct. The right lateral (posterior) duct appears mildly dilated and may be partially obstructed. Mild gallbladder wall edema and pericholecystic fluid. No common bile duct dilatation. Pancreas: No mass, inflammatory changes, or other parenchymal abnormality identified. Spleen:  Splenomegaly. Adrenals/Urinary Tract: New bilateral adrenal lesions compatible with metastatic disease. Right adrenal nodule measures 4 cm, image 49/32. The left adrenal metastasis measures 1.7 cm, image 56/32. Unremarkable appearance of the kidneys. Stomach/Bowel: Visualized portions within the abdomen are unremarkable. Vascular/Lymphatic: Aortic atherosclerosis. No aneurysm. The main portal vein appears patent. Interval development of retroperitoneal adenopathy. Index right retrocaval node measures 1.7 cm, image 79/28. Small distal esophageal and perigastric varices. Other:  Mild perihepatic ascites. Musculoskeletal: No suspicious bone lesions identified. IMPRESSION: 1. Advanced changes of cirrhosis. Interval development of multifocal enhancing liver lesions, which is concerning for multicentric hepatocellular carcinoma involving both lobes of liver. 2. Lesion within  segment 4 B obstructs the right medial hepatic duct and left main duct resulting in moderate to marked biliary ductal dilatation within the left lobe and dome of liver. There may be partial obstruction of the right lateral duct with mild dilatation. 3. Tumor thrombus within the left branch of the portal vein likely secondary to segment 4 B tumor. 4. Bilateral adrenal metastasis and retroperitoneal nodal metastasis. 5. Stigmata of portal venous hypertension including varices, ascites and splenomegaly. 6. These results will be called to the ordering clinician or representative by the Radiologist Assistant, and communication documented in the PACS or zVision Dashboard. Electronically Signed   By: Kerby Moors M.D.   On: 10/25/2017 13:31   Mr Abdomen Mrcp Moise Boring Contast  Result Date: 10/25/2017 CLINICAL DATA:  Evaluate liver lesion. EXAM: MRI ABDOMEN WITHOUT AND WITH CONTRAST (INCLUDING MRCP) TECHNIQUE: Multiplanar multisequence MR imaging of the abdomen was performed both before and after the administration of intravenous contrast. Heavily T2-weighted images of the biliary and pancreatic ducts were obtained, and three-dimensional MRCP images were rendered by post processing. CONTRAST:  10 cc Gadavist COMPARISON:  CT AP 02/24/2017. FINDINGS: Lower chest: Bilateral airspace consolidation identified. Hepatobiliary: The liver is cirrhotic. There is a diffuse nodular contour with hypertrophy of the caudate lobe and lateral segment of left lobe. There has been interval development of multiple suspicious enhancing liver lesions involving both lobes. -index lesion within the anterior aspect of segment 7 measures 3.6 cm, image 30/26. -Index lesion within segment 6 measures 2.4 cm, image 63/26. -index lesion within segment 2 measures 1.3 cm, image 45/28. -index lesion is within segment 4B measures 4.3 cm, image 45/26. There is associated tumor thrombus involving the left branch  of the portal vein, image 39/28. Additionally,  this mass obstructs the right medial hepatic duct and left main hepatic duct. The right lateral (posterior) duct appears mildly dilated and may be partially obstructed. Mild gallbladder wall edema and pericholecystic fluid. No common bile duct dilatation. Pancreas: No mass, inflammatory changes, or other parenchymal abnormality identified. Spleen:  Splenomegaly. Adrenals/Urinary Tract: New bilateral adrenal lesions compatible with metastatic disease. Right adrenal nodule measures 4 cm, image 49/32. The left adrenal metastasis measures 1.7 cm, image 56/32. Unremarkable appearance of the kidneys. Stomach/Bowel: Visualized portions within the abdomen are unremarkable. Vascular/Lymphatic: Aortic atherosclerosis. No aneurysm. The main portal vein appears patent. Interval development of retroperitoneal adenopathy. Index right retrocaval node measures 1.7 cm, image 79/28. Small distal esophageal and perigastric varices. Other:  Mild perihepatic ascites. Musculoskeletal: No suspicious bone lesions identified. IMPRESSION: 1. Advanced changes of cirrhosis. Interval development of multifocal enhancing liver lesions, which is concerning for multicentric hepatocellular carcinoma involving both lobes of liver. 2. Lesion within segment 4 B obstructs the right medial hepatic duct and left main duct resulting in moderate to marked biliary ductal dilatation within the left lobe and dome of liver. There may be partial obstruction of the right lateral duct with mild dilatation. 3. Tumor thrombus within the left branch of the portal vein likely secondary to segment 4 B tumor. 4. Bilateral adrenal metastasis and retroperitoneal nodal metastasis. 5. Stigmata of portal venous hypertension including varices, ascites and splenomegaly. 6. These results will be called to the ordering clinician or representative by the Radiologist Assistant, and communication documented in the PACS or zVision Dashboard. Electronically Signed   By: Kerby Moors M.D.   On: 10/25/2017 13:31   Ir Int Lianne Cure Biliary Drain With Cholangiogram  Result Date: 10/26/2017 INDICATION: 62 year old male with a history of cirrhosis, presumed HCC, portal vein thrombosis, and jaundice with evidence of obstructed biliary system at the liver hilum. EXAM: IMAGE GUIDED PERCUTANEOUS TRANSHEPATIC CHOLANGIOGRAM WITH INTERNAL/EXTERNAL BILIARY DRAINAGE MEDICATIONS: Ciprofloxacin 400 mg IV; The antibiotic was administered within an appropriate time frame prior to the initiation of the procedure. 4 MG IV ZOFRAN ANESTHESIA/SEDATION: Moderate (conscious) sedation was employed during this procedure. A total of Versed 4.0 mg and Fentanyl 100 mcg was administered intravenously. Moderate Sedation Time: 17 MINUTES minutes. The patient's level of consciousness and vital signs were monitored continuously by radiology nursing throughout the procedure under my direct supervision. FLUOROSCOPY TIME:  Fluoroscopy Time: 3 minutes 18 seconds (175 mGy). COMPLICATIONS: None PROCEDURE: The procedure, risks, benefits, and alternatives were explained to the patient and the patient's family. A complete informed consent was performed, with risk benefit analysis. Specific risks that were discussed for the procedure include bleeding, infection, biliary sepsis, IC use day, organ injury, need for further procedure, need for further surgery, long-term drain placement, cardiopulmonary collapse, death. Questions regarding the procedure were encouraged and answered. The patient understands and consents to the procedure. Patient is position in supine position on the fluoroscopy table, and the upper abdomen was prepped and draped in the usual sterile fashion. Maximum barrier sterile technique with sterile gowns and gloves were used for the procedure. A timeout was performed prior to the initiation of the procedure. Local anesthesia was provided with 1% lidocaine with epinephrine. Ultrasound survey of the left liver lobe was  performed, with then ultrasound of the right liver lobe. 1% lidocaine was used for local anesthesia, with generous infiltration of the skin and subcutaneous tissues in and inter left costal location. Formal PTC puncture of the right  liver was performed from intercostal approach. Once the tip of the needle was confirmed within the biliary system by observing for spontaneous bile return, and injecting small aliquots of contrast, images were stored of the biliary system after partially opacifying the biliary tree via the needle. A second access was required for access into the biliary system, from similar right-sided intercostal location. Once the tip of this second needle was confirmed within the biliary system, an 018 wire was advanced centrally. The needle was removed, a small incision was made with an 11 blade scalpel, and then a triaxial Accustick system was advanced into the biliary system. The metal stiffener and dilator were removed, we confirmed placement with contrast infusion. A coaxial Glidewire and 4 French glide cath were then used to navigate across the obstruction at the hilum of the liver. Once the catheter was presumed to be within the duodenum, the wire was removed and contrast confirmed location. A stiff Amplatz wire was advanced through the system, and the Accustick and Glidewire were removed. Dilation of the subcutaneous tissue tracks was performed with an 8 [place] dilator, and then a 10 [place] biliary drain was placed as an internal/external biliary drain. Small amount of contrast confirmed location. The patient remained hemodynamically stable throughout. Placement of the drain resulted in significant nausea. We elected to defer the left-sided drainage at this time. No complications were encountered and no significant blood loss was encountered. IMPRESSION: Status post formal PTC and internal/external biliary drainage. Signed, Dulcy Fanny. Dellia Nims, RPVI Vascular and Interventional Radiology  Special[facility] Radiology Electronically Signed   By: Corrie Mckusick D.O.   On: 10/26/2017 14:45    Labs:  CBC: Recent Labs    10/25/17 0551 10/26/17 0539 10/27/17 0249 10/28/17 0221  WBC 8.2 6.7 7.6 8.1  HGB 12.2* 11.9* 11.6* 10.6*  HCT 36.9* 36.1* 34.8* 31.6*  PLT 225 198 231 245    COAGS: Recent Labs    04/02/17 1346 10/23/17 1918 10/25/17 0551 10/26/17 0539  INR 1.2* 1.07 1.15 1.12    BMP: Recent Labs    10/25/17 0551 10/26/17 0539 10/27/17 0249 10/28/17 0221  NA 129* 131* 127* 127*  K 4.7 4.5 3.9 4.3  CL 96* 98 95* 95*  CO2 _0 GLUCOSE 131* 143* 121* 150*  BUN _1 7*  CALCIUM 8.8* 8.8* 8.6* 8.4*  CREATININE 0.38* 0.42* 0.39* 0.52*  GFRNONAA >60 >60 >60 >60  GFRAA >60 >60 >60 >60    LIVER FUNCTION TESTS: Recent Labs    10/25/17 0551 10/26/17 0539 10/27/17 0249 10/28/17 0221  BILITOT 20.4* 19.5* 12.6* 9.3*  AST 214* 203* 134* 80*  ALT 197* 194* 166* 122*  ALKPHOS 786* 762* 721* 546*  PROT 7.4 7.1 7.4 7.2  ALBUMIN 2.4* 2.3* 2.3* 2.3*    Assessment and Plan: Pt with hx of colon cancer 2007, cirrhosis, liver lesions, bilateral adrenal masses, obstructive jaundice, portal vein thrombosis, elevated LFT's, right lung mass, LLL pulmonary nodule, rt hilar/mediastinal/retroperitoneal adenopathy; s/p PTC with internal/external biliary drain on 10/26/2017 with hemobilia; off IV heparin, afebrile, WBC normal, hemoglobin 10.6 (11.6), total bilirubin 9.3 (12.6), other LFT's sl lower; continue with current treatment, close monitoring of labs.  Patient tentatively scheduled for image guided liver lesion biopsy on 10/23.  Imaging studies have been reviewed by Dr. Kathlene Cote. Biliary drain positioning appears stable.  Risks and benefits discussed with the patient/spouse including, but not limited to bleeding, infection, damage to adjacent structures or low yield requiring additional tests.  All of the patient's questions were answered, patient is  agreeable to proceed. Consent signed and in chart.  Pt has high tolerance for opiates   Electronically Signed: D. Rowe Robert, PA-C 10/28/2017, 2:32 PM   I spent a total of 25 minutes at the the patient's bedside AND on the patient's hospital floor or unit, greater than 50% of which was counseling/coordinating care for biliary drain/liver lesion biopsy

## 2017-10-28 NOTE — Progress Notes (Signed)
Changed drainage bag. Was not able to empty bag due to clots in bag. Emptied out approximately 238ml with about 162ml of clotted looking blood in bag.

## 2017-10-28 NOTE — Progress Notes (Signed)
Chart reviewed, spoke with Dr. Quincy Simmonds about this case.  Bilirubin continues to downtrend, drain is working. Having some bleeding out of the biliary drain on anticoagulation for the portal vein thrombosis. CT chest shows lung mass. In total patient with cirrhosis and multiple liver lesions, adrenal lesions, lung mass, retroperitoneal lymphadenopathy, consistent with metastatic disease. Plan at this time is for biopsy of the lung mass for definitive diagnosis per IR, tentatively for tomorrow, and then treatment per Oncology once definitive diagnosis is made. If diagnosis remains unclear and colonoscopy is needed at some point in time please contact us.  We will follow peripherally at this time, please call with questions.   Matthew Cellar, MD [facility] Gastroenterology

## 2017-10-28 NOTE — Progress Notes (Signed)
Pt reports noticing blood in the toilet when he had a BM.  Advised to save it ( not flush )  The next time so it can be observed.  Bloody drainage noted on drain sponges - new dressing applied. Tube flushed with 22ml with 51ml aspirated back.

## 2017-10-29 ENCOUNTER — Inpatient Hospital Stay (HOSPITAL_COMMUNITY): Payer: Medicare Other

## 2017-10-29 ENCOUNTER — Encounter (HOSPITAL_COMMUNITY): Payer: Self-pay | Admitting: Radiology

## 2017-10-29 LAB — COMPREHENSIVE METABOLIC PANEL
ALBUMIN: 2.3 g/dL — AB (ref 3.5–5.0)
ALK PHOS: 439 U/L — AB (ref 38–126)
ALT: 94 U/L — AB (ref 0–44)
AST: 79 U/L — ABNORMAL HIGH (ref 15–41)
Anion gap: 9 (ref 5–15)
BUN: 7 mg/dL — ABNORMAL LOW (ref 8–23)
CALCIUM: 8.4 mg/dL — AB (ref 8.9–10.3)
CHLORIDE: 95 mmol/L — AB (ref 98–111)
CO2: 26 mmol/L (ref 22–32)
CREATININE: 0.47 mg/dL — AB (ref 0.61–1.24)
GFR calc Af Amer: >60 mL/min (ref >60)
GFR calc non Af Amer: >60 mL/min (ref >60)
GLUCOSE: 144 mg/dL — AB (ref 70–99)
Potassium: 4.6 mmol/L (ref 3.5–5.1)
SODIUM: 130 mmol/L — AB (ref 135–145)
Total Bilirubin: 8.2 mg/dL — ABNORMAL HIGH (ref 0.3–1.2)
Total Protein: 7.2 g/dL (ref 6.5–8.1)

## 2017-10-29 LAB — CBC WITH DIFFERENTIAL/PLATELET
ABS IMMATURE GRANULOCYTES: 0.07 10*3/uL (ref 0.00–0.07)
BASOS ABS: 0 10*3/uL (ref 0.0–0.1)
BASOS PCT: 1 %
Eosinophils Absolute: 0.5 10*3/uL (ref 0.0–0.5)
Eosinophils Relative: 7 %
HCT: 30 % — ABNORMAL LOW (ref 39.0–52.0)
HEMOGLOBIN: 9.9 g/dL — AB (ref 13.0–17.0)
IMMATURE GRANULOCYTES: 1 %
LYMPHS PCT: 22 %
Lymphs Abs: 1.5 10*3/uL (ref 0.7–4.0)
MCH: 30.6 pg (ref 26.0–34.0)
MCHC: 33 g/dL (ref 30.0–36.0)
MCV: 92.6 fL (ref 80.0–100.0)
Monocytes Absolute: 0.6 10*3/uL (ref 0.1–1.0)
Monocytes Relative: 9 %
NEUTROS ABS: 4.1 10*3/uL (ref 1.7–7.7)
NEUTROS PCT: 60 %
NRBC: 0 % (ref 0.0–0.2)
PLATELETS: 262 10*3/uL (ref 150–400)
RBC: 3.24 MIL/uL — AB (ref 4.22–5.81)
RDW: 18.7 % — ABNORMAL HIGH (ref 11.5–15.5)
WBC: 6.8 10*3/uL (ref 4.0–10.5)

## 2017-10-29 LAB — GLUCOSE, CAPILLARY
Glucose-Capillary: 135 mg/dL — ABNORMAL HIGH (ref 70–99)
Glucose-Capillary: 122 mg/dL — ABNORMAL HIGH (ref 70–99)

## 2017-10-29 LAB — MAGNESIUM: Magnesium: 2.1 mg/dL (ref 1.7–2.4)

## 2017-10-29 LAB — AMMONIA: Ammonia: 29 umol/L (ref 9–35)

## 2017-10-29 MED ORDER — OXYCODONE HCL 10 MG PO TABS: 10.0000 mg | ORAL_TABLET | ORAL | 0 refills | Status: DC | PRN

## 2017-10-29 MED ORDER — MIDAZOLAM HCL 2 MG/2ML IJ SOLN
INTRAMUSCULAR | Status: AC
  Filled 2017-10-29: qty 4

## 2017-10-29 MED ORDER — FENTANYL CITRATE (PF) 100 MCG/2ML IJ SOLN
INTRAMUSCULAR | Status: AC
  Filled 2017-10-29: qty 2

## 2017-10-29 MED ORDER — SENNA 8.6 MG PO TABS
1.0000 | ORAL_TABLET | Freq: Two times a day (BID) | ORAL | Status: DC
  Administered 2017-10-29: 8.6 mg via ORAL
  Filled 2017-10-29: qty 1

## 2017-10-29 MED ORDER — MIDAZOLAM HCL 2 MG/2ML IJ SOLN
INTRAMUSCULAR | Status: AC | PRN
  Administered 2017-10-29: 2 mg via INTRAVENOUS

## 2017-10-29 MED ORDER — LIDOCAINE HCL 1 % IJ SOLN
INTRAMUSCULAR | Status: AC
  Filled 2017-10-29: qty 20

## 2017-10-29 MED ORDER — LACTULOSE 10 GM/15ML PO SOLN
20.0000 g | Freq: Two times a day (BID) | ORAL | Status: DC
  Administered 2017-10-29: 20 g via ORAL
  Filled 2017-10-29: qty 30

## 2017-10-29 MED ORDER — LIDOCAINE HCL (PF) 1 % IJ SOLN
INTRAMUSCULAR | Status: AC | PRN
  Administered 2017-10-29: 10 mL

## 2017-10-29 MED ORDER — FENTANYL CITRATE (PF) 100 MCG/2ML IJ SOLN
INTRAMUSCULAR | Status: AC | PRN
  Administered 2017-10-29 (×2): 50 ug via INTRAVENOUS

## 2017-10-29 NOTE — Discharge Summary (Signed)
Physician Discharge Summary  Matthew Lewis ZYS:[id number] DOB: February 04, 1955 DOA: 10/23/2017  PCP: [name], MD  Admit date: 10/23/2017 Discharge date: 10/29/2017  Time spent: 35 minutes  Recommendations for Outpatient Follow-up:  1. Needs CMET and cbc 1 week 2. Needs follow up of adrenal biopsy as per DR. Feng 3. Limited RX oxy given on d/c  Discharge Diagnoses:  Principal Problem:   Obstructive jaundice Active Problems:   Hypertension   Diabetes mellitus ([place])   Hx of substance abuse- clean several years now   Alcoholic cirrhosis of liver without ascites (HCC)   Transaminitis   Liver lesion   History of colon cancer   Cirrhosis ([place])   Malignant neoplasm metastatic to adrenal gland [facility])   Discharge Condition: gaurded  Diet recommendation:  prior  Filed Weights   10/23/17 1707  Weight: 103 kg    History of present illness:  62 year old male with medical history significant for diabetes mellitus type 2, hypertension, alcoholic cirrhosis and history of colon cancer in remission.  Patient presented to the emergency department after being sent from primary care physician for abnormal LFTs.  Patient has been having diarrhea for 2 weeks prior to admission with weight loss and jaundice.  Patient was seen by PCP and started on empiric vancomycin for C. difficile and lab work-up revealed elevated liver enzymes with severely elevated T bili.  Patient admitted with working diagnosis of jaundice. GI was consulted.   Hospital Course:   Obstructive jaundice  Multifocal masses concerning for HCC and tumor invading the left portal vein with portal vein thrombosis.  Mass is obstructing the biliary tree. GI unable to perform ERCP, s/p percutaneous drain. LFTs sensitivities trending down.  Portal vein thrombosis Will hold heparin drip for now as patient has been bleeding through percutaneous bilious drain.  Also one episode of bloody bowel movement.  Case discussed with  oncology and anticoagulation therapy as OP per [name] who I confirmed this with on day of d/c  ? [facility] with bilateral adrenal metastases and retroperitoneal nodal metastases/Lung mass Multiple masses, unclear which one is primary at this time.  Patient had adrenal biopsy 10/23 AFP 1.3, CEA difficulty elevated 1,16. ?  Recurrence of colon cancer. ?  Will need CT head, will defer to oncology for further work-up.  Hx of cirrhosis  [place] consistent with cirrhotic pattern, ammonia levels mild elevated, continue low-dose lactulose BID. Management per GI   HTN  BP stable, continue lisinopril  DM type 2 a1c 6.5 CBGs stable during hospital stay  Diarrhea - resolved    Discharge Exam: Vitals:   10/29/17 0618 10/29/17 1309  BP: 119/70 115/74  Pulse:  (!) 104  Resp: 16 18  Temp: 98.5 F (36.9 C) 98.9 F (37.2 C)  SpO2: 98% 98%    General: awake alert pleasant in nad Cardiovascular:  s1 s2 no m/r/g Respiratory: clear no added sound abd soft nt nd no rebound no guard Neuro intact Psych pleaant  Discharge Instructions    Allergies as of 10/29/2017      Reactions   Morphine And Related Other (See Comments)   PATIENT IS RECOVERING FROM DRUG ADDICTION PATIENT WANTS TO AVOID ANY NARCOTICS   Other Other (See Comments)   PATIENT IS RECOVERING FROM DRUG ADDICTION PATIENT WANTS TO AVOID ANY NARCOTICS      Medication List    TAKE these medications   ALLEGRA-D 24 HOUR PO Take by mouth daily. WAL/FEX   aspirin EC 81 MG tablet Take 81 mg  by mouth daily.   atorvastatin 40 MG tablet Commonly known as:  LIPITOR Take 1 tablet (40 mg total) by mouth daily at 6 PM. What changed:  how much to take   calcium carbonate 500 MG chewable tablet Commonly known as:  TUMS - dosed in mg elemental calcium Chew 1 tablet by mouth 2 (two) times daily as needed for indigestion or heartburn.   celecoxib 200 MG capsule Commonly known as:  CELEBREX Take 1 capsule by mouth daily.   gabapentin  400 MG capsule Commonly known as:  NEURONTIN Take 400-800 mg by mouth 2 (two) times daily. 1 capsule am, 2 capsules pm   glipiZIDE 5 MG tablet Commonly known as:  GLUCOTROL Take 5 mg by mouth 2 (two) times daily before a meal.   JARDIANCE 10 MG Tabs tablet Generic drug:  empagliflozin Take 10 mg by mouth at bedtime.   LEVEMIR FLEXPEN 100 UNIT/ML Pen Generic drug:  Insulin Detemir Inject 10-60 Units into the skin 2 (two) times daily. Take 62 units in the am and Take 10 units in the pm   lisinopril 20 MG tablet Commonly known as:  PRINIVIL,ZESTRIL Take 20 mg by mouth daily.   metFORMIN 1000 MG tablet Commonly known as:  GLUCOPHAGE Take 1,000 mg by mouth 2 (two) times daily with a meal.   multivitamin tablet Take 1 tablet by mouth daily.   Oxycodone HCl 10 MG Tabs Take 1 tablet (10 mg total) by mouth every 4 (four) hours as needed for severe pain.   polyvinyl alcohol 1.4 % ophthalmic solution Commonly known as:  LIQUIFILM TEARS 1 drop as needed for dry eyes.   vancomycin 125 MG capsule Commonly known as:  VANCOCIN Take 1 capsule by mouth every 6 (six) hours.      Allergies  Allergen Reactions  . Morphine And Related Other (See Comments)    PATIENT IS RECOVERING FROM DRUG ADDICTION PATIENT WANTS TO AVOID ANY NARCOTICS  . Other Other (See Comments)    PATIENT IS RECOVERING FROM DRUG ADDICTION PATIENT WANTS TO AVOID ANY NARCOTICS      The results of significant diagnostics from this hospitalization (including imaging, microbiology, ancillary and laboratory) are listed below for reference.    Significant Diagnostic Studies: Ct Chest Wo Contrast  Result Date: 10/27/2017 CLINICAL DATA:  Multifocal liver lesions.  Weight loss. EXAM: CT CHEST WITHOUT CONTRAST TECHNIQUE: Multidetector CT imaging of the chest was performed following the standard protocol without IV contrast. COMPARISON:  None FINDINGS: Cardiovascular: Normal heart size. No pericardial effusion. Aortic  atherosclerosis. Calcification scratch set 3 vessel coronary artery atherosclerotic calcifications. Mediastinum/Nodes: Normal appearance of the thyroid gland. The trachea appears patent and is midline. Normal appearance of the esophagus. Subcarinal lymph node is enlarged measuring 2.1 cm. Enlarged azygos-esophageal lymph node measures 2 cm, image 28/2. Right hilar lymph node is enlarged measuring 1.8 cm, image 82/2. Lungs/Pleura: Subpleural area of masslike architectural distortion within the posterolateral right lung base is identified measuring 4.9 x 5.0 cm, image 104/2. Nodule within the left lower lobe measures 2.4 cm, image 111/7. Upper Abdomen: Cirrhotic appearing liver is again noted. There has been interval placement of percutaneous transit [facility] biliary drainage catheter. Musculoskeletal: No chest wall mass or suspicious bone lesions identified. IMPRESSION: 1. Large mass in the posterolateral right lung base and left lower lobe pulmonary nodule. Suspicious for malignancy. Cannot rule out metastatic disease from suspected multifocal hepatoma. 2. Right hilar and mediastinal adenopathy. 3.  Aortic Atherosclerosis (ICD10-I70.0). 4. Multi vessel coronary artery  atherosclerotic calcifications. Electronically Signed   By: Kerby Moors M.D.   On: 10/27/2017 11:58   US Abdomen Complete  Result Date: 10/23/2017 CLINICAL DATA:  Jaundiced. EXAM: ABDOMEN ULTRASOUND COMPLETE COMPARISON:  Body CT 02/24/2017 FINDINGS: Gallbladder: No gallstones seen. There is gallbladder wall thickening measuring 6 mm. No sonographic Murphy sign noted by sonographer. Common bile duct: Diameter: 6.3 mm Liver: Coarse nodular echogenicity of the liver. Area of heterogeneity or an ill-defined mass in segment 6 of the liver measures 2.6 x 2.4 x 2.2 cm. Portal vein is patent on color Doppler imaging with normal direction of blood flow towards the liver. IVC: No abnormality visualized. Pancreas: Visualized portion unremarkable. Spleen:  Splenomegaly. Splenic length is 16.2 cm with volume of 994 cubic cm. Right Kidney: Length: 14.8 cm. Echogenicity within normal limits. No mass or hydronephrosis visualized. Left Kidney: Length: 14.7 cm. Echogenicity within normal limits. No mass or hydronephrosis visualized. Abdominal aorta: No aneurysm visualized. Other findings: None. IMPRESSION: Cirrhotic appearance of the liver. 2.6 cm questionable ill-defined mass versus area of focal parenchymal heterogeneity in segment 6 of the liver. If further imaging evaluation is desired, liver protocol abdominal MRI should be considered. Splenomegaly. Apparent thickening of the gallbladder wall, without evidence of cholelithiasis, and with negative sonographic Murphy's sign. The findings are favored to represent gallbladder wall edema secondary to systemic causes such as hyperproteinemia rather than acute cholecystitis. Please correlate clinically. Electronically Signed   By: Fidela Salisbury M.D.   On: 10/23/2017 20:58   Ct Abdomen Pelvis W Contrast  Result Date: 10/27/2017 CLINICAL DATA:  Drain placed yesterday with little output. EXAM: CT ABDOMEN AND PELVIS WITH CONTRAST TECHNIQUE: Multidetector CT imaging of the abdomen and pelvis was performed using the standard protocol following bolus administration of intravenous contrast. CONTRAST:  192m ISOVUE-300 IOPAMIDOL (ISOVUE-300) INJECTION 61% COMPARISON:  chest CT earlier today.  Abdominal CT 02/24/2017 FINDINGS: Lower chest: Subcarinal adenopathy partially imaged on the highest image, measuring 2.2 cm in short axis diameter. Left lower lobe nodule again noted, 2.5 cm. Masslike airspace opacity noted posteriorly in the right lower lobe, stable. Trace right pleural effusion. Hepatobiliary: Cirrhotic changes within the liver. Percutaneous transhepatic biliary drain in place. The tip is in the distal common bile duct. There is mild intrahepatic biliary ductal dilatation, particularly in the left hepatic lobe which  appear stable since earlier chest CT. Multifocal areas of masslike low-density noted within the liver concerning for multifocal hepatoma or metastases. Largest is noted centrally in the left hepatic lobe measuring 5.1 x 4.2 cm. 1.9 cm low-density area in the anterior right hepatic lobe on image 16. 2.5 cm low-density lesion in the right inferior hepatic lobe. Stones or sludge noted within the gallbladder. Pancreas: No focal abnormality or ductal dilatation. Spleen: No focal abnormality. Splenomegaly. Craniocaudal length measures 17 cm. Adrenals/Urinary Tract: Bilateral adrenal nodules and masses compatible with metastases, the largest in the right adrenal gland measuring 3.5 cm. No renal mass or hydronephrosis. Urinary bladder is unremarkable. Stomach/Bowel: Moderate to large stool burden in the colon with mild gaseous distention of the colon. Stomach and small bowel decompressed, unremarkable. Vascular/Lymphatic: Aorta is normal caliber. No retroperitoneal adenopathy. Aortocaval node has short axis diameter of 2.2 cm on image 45. Aortic calcifications. Reproductive: Mildly prominent prostate. Other: No free fluid or free air. Musculoskeletal: No acute bony abnormality. Degenerative disc and facet disease throughout the lumbar spine. IMPRESSION: Changes of cirrhosis. Multiple hepatic lesions are noted concerning for multifocal hepatoma or metastases. Biliary drain is in place with  the tip in the distal common bile duct. There is mild intrahepatic biliary ductal dilatation. Bilateral adrenal nodules and masses, right larger than left compatible with metastases. Retroperitoneal adenopathy. Large stool burden with mild gaseous distention of the colon. Aortic atherosclerosis. Electronically Signed   By: Rolm Baptise M.D.   On: 10/27/2017 21:04   Mr 3d Recon At Scanner  Result Date: 10/25/2017 CLINICAL DATA:  Evaluate liver lesion. EXAM: MRI ABDOMEN WITHOUT AND WITH CONTRAST (INCLUDING MRCP) TECHNIQUE: Multiplanar  multisequence MR imaging of the abdomen was performed both before and after the administration of intravenous contrast. Heavily T2-weighted images of the biliary and pancreatic ducts were obtained, and three-dimensional MRCP images were rendered by post processing. CONTRAST:  10 cc Gadavist COMPARISON:  CT AP 02/24/2017. FINDINGS: Lower chest: Bilateral airspace consolidation identified. Hepatobiliary: The liver is cirrhotic. There is a diffuse nodular contour with hypertrophy of the caudate lobe and lateral segment of left lobe. There has been interval development of multiple suspicious enhancing liver lesions involving both lobes. -index lesion within the anterior aspect of segment 7 measures 3.6 cm, image 30/26. -Index lesion within segment 6 measures 2.4 cm, image 63/26. -index lesion within segment 2 measures 1.3 cm, image 45/28. -index lesion is within segment 4B measures 4.3 cm, image 45/26. There is associated tumor thrombus involving the left branch of the portal vein, image 39/28. Additionally, this mass obstructs the right medial hepatic duct and left main hepatic duct. The right lateral (posterior) duct appears mildly dilated and may be partially obstructed. Mild gallbladder wall edema and pericholecystic fluid. No common bile duct dilatation. Pancreas: No mass, inflammatory changes, or other parenchymal abnormality identified. Spleen:  Splenomegaly. Adrenals/Urinary Tract: New bilateral adrenal lesions compatible with metastatic disease. Right adrenal nodule measures 4 cm, image 49/32. The left adrenal metastasis measures 1.7 cm, image 56/32. Unremarkable appearance of the kidneys. Stomach/Bowel: Visualized portions within the abdomen are unremarkable. Vascular/Lymphatic: Aortic atherosclerosis. No aneurysm. The main portal vein appears patent. Interval development of retroperitoneal adenopathy. Index right retrocaval node measures 1.7 cm, image 79/28. Small distal esophageal and perigastric varices.  Other:  Mild perihepatic ascites. Musculoskeletal: No suspicious bone lesions identified. IMPRESSION: 1. Advanced changes of cirrhosis. Interval development of multifocal enhancing liver lesions, which is concerning for multicentric hepatocellular carcinoma involving both lobes of liver. 2. Lesion within segment 4 B obstructs the right medial hepatic duct and left main duct resulting in moderate to marked biliary ductal dilatation within the left lobe and dome of liver. There may be partial obstruction of the right lateral duct with mild dilatation. 3. Tumor thrombus within the left branch of the portal vein likely secondary to segment 4 B tumor. 4. Bilateral adrenal metastasis and retroperitoneal nodal metastasis. 5. Stigmata of portal venous hypertension including varices, ascites and splenomegaly. 6. These results will be called to the ordering clinician or representative by the Radiologist Assistant, and communication documented in the PACS or zVision Dashboard. Electronically Signed   By: Kerby Moors M.D.   On: 10/25/2017 13:31   Mr Abdomen Mrcp Moise Boring Contast  Result Date: 10/25/2017 CLINICAL DATA:  Evaluate liver lesion. EXAM: MRI ABDOMEN WITHOUT AND WITH CONTRAST (INCLUDING MRCP) TECHNIQUE: Multiplanar multisequence MR imaging of the abdomen was performed both before and after the administration of intravenous contrast. Heavily T2-weighted images of the biliary and pancreatic ducts were obtained, and three-dimensional MRCP images were rendered by post processing. CONTRAST:  10 cc Gadavist COMPARISON:  CT AP 02/24/2017. FINDINGS: Lower chest: Bilateral airspace consolidation identified. Hepatobiliary:  The liver is cirrhotic. There is a diffuse nodular contour with hypertrophy of the caudate lobe and lateral segment of left lobe. There has been interval development of multiple suspicious enhancing liver lesions involving both lobes. -index lesion within the anterior aspect of segment 7 measures 3.6 cm,  image 30/26. -Index lesion within segment 6 measures 2.4 cm, image 63/26. -index lesion within segment 2 measures 1.3 cm, image 45/28. -index lesion is within segment 4B measures 4.3 cm, image 45/26. There is associated tumor thrombus involving the left branch of the portal vein, image 39/28. Additionally, this mass obstructs the right medial hepatic duct and left main hepatic duct. The right lateral (posterior) duct appears mildly dilated and may be partially obstructed. Mild gallbladder wall edema and pericholecystic fluid. No common bile duct dilatation. Pancreas: No mass, inflammatory changes, or other parenchymal abnormality identified. Spleen:  Splenomegaly. Adrenals/Urinary Tract: New bilateral adrenal lesions compatible with metastatic disease. Right adrenal nodule measures 4 cm, image 49/32. The left adrenal metastasis measures 1.7 cm, image 56/32. Unremarkable appearance of the kidneys. Stomach/Bowel: Visualized portions within the abdomen are unremarkable. Vascular/Lymphatic: Aortic atherosclerosis. No aneurysm. The main portal vein appears patent. Interval development of retroperitoneal adenopathy. Index right retrocaval node measures 1.7 cm, image 79/28. Small distal esophageal and perigastric varices. Other:  Mild perihepatic ascites. Musculoskeletal: No suspicious bone lesions identified. IMPRESSION: 1. Advanced changes of cirrhosis. Interval development of multifocal enhancing liver lesions, which is concerning for multicentric hepatocellular carcinoma involving both lobes of liver. 2. Lesion within segment 4 B obstructs the right medial hepatic duct and left main duct resulting in moderate to marked biliary ductal dilatation within the left lobe and dome of liver. There may be partial obstruction of the right lateral duct with mild dilatation. 3. Tumor thrombus within the left branch of the portal vein likely secondary to segment 4 B tumor. 4. Bilateral adrenal metastasis and retroperitoneal nodal  metastasis. 5. Stigmata of portal venous hypertension including varices, ascites and splenomegaly. 6. These results will be called to the ordering clinician or representative by the Radiologist Assistant, and communication documented in the PACS or zVision Dashboard. Electronically Signed   By: Kerby Moors M.D.   On: 10/25/2017 13:31   Ir Int Lianne Cure Biliary Drain With Cholangiogram  Result Date: 10/26/2017 INDICATION: 62 year old male with a history of cirrhosis, presumed HCC, portal vein thrombosis, and jaundice with evidence of obstructed biliary system at the liver hilum. EXAM: IMAGE GUIDED PERCUTANEOUS TRANSHEPATIC CHOLANGIOGRAM WITH INTERNAL/EXTERNAL BILIARY DRAINAGE MEDICATIONS: Ciprofloxacin 400 mg IV; The antibiotic was administered within an appropriate time frame prior to the initiation of the procedure. 4 MG IV ZOFRAN ANESTHESIA/SEDATION: Moderate (conscious) sedation was employed during this procedure. A total of Versed 4.0 mg and Fentanyl 100 mcg was administered intravenously. Moderate Sedation Time: 17 MINUTES minutes. The patient's level of consciousness and vital signs were monitored continuously by radiology nursing throughout the procedure under my direct supervision. FLUOROSCOPY TIME:  Fluoroscopy Time: 3 minutes 18 seconds (175 mGy). COMPLICATIONS: None PROCEDURE: The procedure, risks, benefits, and alternatives were explained to the patient and the patient's family. A complete informed consent was performed, with risk benefit analysis. Specific risks that were discussed for the procedure include bleeding, infection, biliary sepsis, IC use day, organ injury, need for further procedure, need for further surgery, long-term drain placement, cardiopulmonary collapse, death. Questions regarding the procedure were encouraged and answered. The patient understands and consents to the procedure. Patient is position in supine position on the fluoroscopy table, and the upper  abdomen was prepped and  draped in the usual sterile fashion. Maximum barrier sterile technique with sterile gowns and gloves were used for the procedure. A timeout was performed prior to the initiation of the procedure. Local anesthesia was provided with 1% lidocaine with epinephrine. Ultrasound survey of the left liver lobe was performed, with then ultrasound of the right liver lobe. 1% lidocaine was used for local anesthesia, with generous infiltration of the skin and subcutaneous tissues in and inter left costal location. Formal PTC puncture of the right liver was performed from intercostal approach. Once the tip of the needle was confirmed within the biliary system by observing for spontaneous bile return, and injecting small aliquots of contrast, images were stored of the biliary system after partially opacifying the biliary tree via the needle. A second access was required for access into the biliary system, from similar right-sided intercostal location. Once the tip of this second needle was confirmed within the biliary system, an 018 wire was advanced centrally. The needle was removed, a small incision was made with an 11 blade scalpel, and then a triaxial Accustick system was advanced into the biliary system. The metal stiffener and dilator were removed, we confirmed placement with contrast infusion. A coaxial Glidewire and 4 French glide cath were then used to navigate across the obstruction at the hilum of the liver. Once the catheter was presumed to be within the duodenum, the wire was removed and contrast confirmed location. A stiff Amplatz wire was advanced through the system, and the Accustick and Glidewire were removed. Dilation of the subcutaneous tissue tracks was performed with an 8 [place] dilator, and then a 10 [place] biliary drain was placed as an internal/external biliary drain. Small amount of contrast confirmed location. The patient remained hemodynamically stable throughout. Placement of the drain resulted in  significant nausea. We elected to defer the left-sided drainage at this time. No complications were encountered and no significant blood loss was encountered. IMPRESSION: Status post formal PTC and internal/external biliary drainage. Signed, Dulcy Fanny. Dellia Nims, RPVI [facility] Radiology Electronically Signed   By: Corrie Mckusick D.O.   On: 10/26/2017 14:45    Microbiology: No results found for this or any previous visit (from the past 240 hour(s)).   Labs: Basic Metabolic Panel: Recent Labs  Lab 10/25/17 0551 10/26/17 0539 10/27/17 0249 10/28/17 0221 10/29/17 0632  NA 129* 131* 127* 127* 130*  K 4.7 4.5 3.9 4.3 4.6  CL 96* 98 95* 95* 95*  CO2 _0 GLUCOSE 131* 143* 121* 150* 144*  BUN _1 7* 7*  CREATININE 0.38* 0.42* 0.39* 0.52* 0.47*  CALCIUM 8.8* 8.8* 8.6* 8.4* 8.4*  MG  --   --  2.2 2.1 2.1   Liver Function Tests: Recent Labs  Lab 10/25/17 0551 10/26/17 0539 10/27/17 0249 10/28/17 0221 10/29/17 0632  AST 214* 203* 134* 80* 79*  ALT 197* 194* 166* 122* 94*  ALKPHOS 786* 762* 721* 546* 439*  BILITOT 20.4* 19.5* 12.6* 9.3* 8.2*  PROT 7.4 7.1 7.4 7.2 7.2  ALBUMIN 2.4* 2.3* 2.3* 2.3* 2.3*   Recent Labs  Lab 10/23/17 1746  LIPASE 27   Recent Labs  Lab 10/23/17 1918 10/25/17 0551 10/27/17 0249 10/29/17 0632  AMMONIA 47* 32 44* 29   CBC: Recent Labs  Lab 10/23/17 1746  10/25/17 0551 10/26/17 0539 10/27/17 0249 10/28/17 0221 10/29/17 0632  WBC 8.5   < > 8.2 6.7 7.6 8.1 6.8  NEUTROABS 6.4  --   --   --   --   --  4.1  HGB 13.0   < > 12.2* 11.9* 11.6* 10.6* 9.9*  HCT 39.5   < > 36.9* 36.1* 34.8* 31.6* 30.0*  MCV 91.6   < > 91.8 91.6 91.1 92.1 92.6  PLT 273   < > 225 198 231 245 262   < > = values in this interval not displayed.   Cardiac Enzymes: No results for input(s): CKTOTAL, CKMB, CKMBINDEX, TROPONINI in the last 168 hours. BNP: BNP (last 3 results) No results for input(s): BNP in  the last 8760 hours.  ProBNP (last 3 results) No results for input(s): PROBNP in the last 8760 hours.  CBG: Recent Labs  Lab 10/28/17 1152 10/28/17 1642 10/28/17 2051 10/29/17 0745 10/29/17 1317  GLUCAP 128* 134* 139* 135* 122*       Signed:  Nita Sells MD   [facility] 10/29/2017, 1:26 PM

## 2017-10-29 NOTE — Procedures (Signed)
Interventional Radiology Procedure Note  Procedure: CT guided biopsy of right adrenal mass  Complications: None  Estimated Blood Loss: None  Findings: Initial [place] of liver shows no discrete identifiable lesions in liver by [place].  Taken to CT for CT guided biopsy of 4 cm right adrenal mass. 18 G core biopsy x 3 via 17 G needle.  Venetia Night. Kathlene Cote, M.D Pager:  [phone]

## 2017-10-29 NOTE — Progress Notes (Signed)
Referring Physician(s): Dr. Zenovia Jarred  Supervising Physician: Aletta Edouard  Patient Status:  [facility] - In-pt  Chief Complaint: Biliary obstruction  Subjective: Still with some blood-tinged bilious output.   Non-tender.   Allergies: Morphine and related and Other  Medications: Prior to Admission medications   Medication Sig Start Date End Date Taking? Authorizing Provider  aspirin EC 81 MG tablet Take 81 mg by mouth daily.   Yes [provider]  atorvastatin (LIPITOR) 40 MG tablet Take 1 tablet (40 mg total) by mouth daily at 6 PM. Patient taking differently: Take 20 mg by mouth daily at 6 PM.  12/02/12  Yes Viyuoh, Adeline C, MD  calcium carbonate (TUMS - DOSED IN MG ELEMENTAL CALCIUM) 500 MG chewable tablet Chew 1 tablet by mouth 2 (two) times daily as needed for indigestion or heartburn.   Yes [provider]  celecoxib (CELEBREX) 200 MG capsule Take 1 capsule by mouth daily. 10/10/17  Yes [provider]  empagliflozin (JARDIANCE) 10 MG TABS tablet Take 10 mg by mouth at bedtime.    Yes [provider]  Fexofenadine-Pseudoephedrine (ALLEGRA-D 24 HOUR PO) Take by mouth daily. WAL/FEX   Yes [provider]  gabapentin (NEURONTIN) 400 MG capsule Take 400-800 mg by mouth 2 (two) times daily. 1 capsule am, 2 capsules pm   Yes [provider]  glipiZIDE (GLUCOTROL) 5 MG tablet Take 5 mg by mouth 2 (two) times daily before a meal.   Yes [provider]  Insulin Detemir (LEVEMIR FLEXPEN) 100 UNIT/ML Pen Inject 10-60 Units into the skin 2 (two) times daily. Take 62 units in the am and Take 10 units in the pm   Yes [provider]  lisinopril (PRINIVIL,ZESTRIL) 20 MG tablet Take 20 mg by mouth daily.   Yes [provider]  metFORMIN (GLUCOPHAGE) 1000 MG tablet Take 1,000 mg by mouth 2 (two) times daily with a meal.   Yes [provider]  Multiple Vitamin (MULTIVITAMIN) tablet Take 1 tablet by mouth  daily.   Yes [provider]  polyvinyl alcohol (LIQUIFILM TEARS) 1.4 % ophthalmic solution 1 drop as needed for dry eyes.   Yes [provider]  vancomycin (VANCOCIN) 125 MG capsule Take 1 capsule by mouth every 6 (six) hours. 10/23/17  Yes [provider]     Vital Signs: BP 106/70   Pulse 94   Temp 98.7 F (37.1 C) (Oral)   Resp 20   Ht 5' 10.5" (1.791 m)   Wt 227 lb (103 kg)   SpO2 100%   BMI 32.11 kg/m   Physical Exam  NAD, alert Abdomen: RUQ drain in place.  Insertion site c/d/i.  bloody, bilious output in collection bag. Still with large volume output.   Imaging: Ct Chest Wo Contrast  Result Date: 10/27/2017 CLINICAL DATA:  Multifocal liver lesions.  Weight loss. EXAM: CT CHEST WITHOUT CONTRAST TECHNIQUE: Multidetector CT imaging of the chest was performed following the standard protocol without IV contrast. COMPARISON:  None FINDINGS: Cardiovascular: Normal heart size. No pericardial effusion. Aortic atherosclerosis. Calcification scratch set 3 vessel coronary artery atherosclerotic calcifications. Mediastinum/Nodes: Normal appearance of the thyroid gland. The trachea appears patent and is midline. Normal appearance of the esophagus. Subcarinal lymph node is enlarged measuring 2.1 cm. Enlarged azygos-esophageal lymph node measures 2 cm, image 28/2. Right hilar lymph node is enlarged measuring 1.8 cm, image 82/2. Lungs/Pleura: Subpleural area of masslike architectural distortion within the posterolateral right lung base is identified measuring 4.9  x 5.0 cm, image 104/2. Nodule within the left lower lobe measures 2.4 cm, image 111/7. Upper Abdomen: Cirrhotic appearing liver is again noted. There has been interval placement of percutaneous transit [facility] biliary drainage catheter. Musculoskeletal: No chest wall mass or suspicious bone lesions identified. IMPRESSION: 1. Large mass in the posterolateral right lung base and left lower lobe pulmonary nodule.  Suspicious for malignancy. Cannot rule out metastatic disease from suspected multifocal hepatoma. 2. Right hilar and mediastinal adenopathy. 3.  Aortic Atherosclerosis (ICD10-I70.0). 4. Multi vessel coronary artery atherosclerotic calcifications. Electronically Signed   By: Kerby Moors M.D.   On: 10/27/2017 11:58   Ct Abdomen Pelvis W Contrast  Result Date: 10/27/2017 CLINICAL DATA:  Drain placed yesterday with little output. EXAM: CT ABDOMEN AND PELVIS WITH CONTRAST TECHNIQUE: Multidetector CT imaging of the abdomen and pelvis was performed using the standard protocol following bolus administration of intravenous contrast. CONTRAST:  173m ISOVUE-300 IOPAMIDOL (ISOVUE-300) INJECTION 61% COMPARISON:  chest CT earlier today.  Abdominal CT 02/24/2017 FINDINGS: Lower chest: Subcarinal adenopathy partially imaged on the highest image, measuring 2.2 cm in short axis diameter. Left lower lobe nodule again noted, 2.5 cm. Masslike airspace opacity noted posteriorly in the right lower lobe, stable. Trace right pleural effusion. Hepatobiliary: Cirrhotic changes within the liver. Percutaneous transhepatic biliary drain in place. The tip is in the distal common bile duct. There is mild intrahepatic biliary ductal dilatation, particularly in the left hepatic lobe which appear stable since earlier chest CT. Multifocal areas of masslike low-density noted within the liver concerning for multifocal hepatoma or metastases. Largest is noted centrally in the left hepatic lobe measuring 5.1 x 4.2 cm. 1.9 cm low-density area in the anterior right hepatic lobe on image 16. 2.5 cm low-density lesion in the right inferior hepatic lobe. Stones or sludge noted within the gallbladder. Pancreas: No focal abnormality or ductal dilatation. Spleen: No focal abnormality. Splenomegaly. Craniocaudal length measures 17 cm. Adrenals/Urinary Tract: Bilateral adrenal nodules and masses compatible with metastases, the largest in the right adrenal  gland measuring 3.5 cm. No renal mass or hydronephrosis. Urinary bladder is unremarkable. Stomach/Bowel: Moderate to large stool burden in the colon with mild gaseous distention of the colon. Stomach and small bowel decompressed, unremarkable. Vascular/Lymphatic: Aorta is normal caliber. No retroperitoneal adenopathy. Aortocaval node has short axis diameter of 2.2 cm on image 45. Aortic calcifications. Reproductive: Mildly prominent prostate. Other: No free fluid or free air. Musculoskeletal: No acute bony abnormality. Degenerative disc and facet disease throughout the lumbar spine. IMPRESSION: Changes of cirrhosis. Multiple hepatic lesions are noted concerning for multifocal hepatoma or metastases. Biliary drain is in place with the tip in the distal common bile duct. There is mild intrahepatic biliary ductal dilatation. Bilateral adrenal nodules and masses, right larger than left compatible with metastases. Retroperitoneal adenopathy. Large stool burden with mild gaseous distention of the colon. Aortic atherosclerosis. Electronically Signed   By: KRolm BaptiseM.D.   On: 10/27/2017 21:04   [place]domen Limited  Result Date: 10/29/2017 CLINICAL DATA:  Prior imaging demonstrating cirrhosis, liver lesions, bilateral lung masses, bilateral adrenal masses and retroperitoneal and subcarinal lymphadenopathy. Imaging of the liver is performed by ultrasound in anticipation of possible ultrasound-guided liver lesion biopsy. EXAM: ULTRASOUND ABDOMEN LIMITED COMPARISON:  MRI of the abdomen on 10/25/2017 and CT of the abdomen on 10/27/2017 FINDINGS: Liver: Previous suggested lesions by both MRI and CT are occult by ultrasound and no discrete lesions are identified to allow accurate liver biopsy under ultrasound guidance. Cirrhosis of  the liver again noted. There is dilatation of intrahepatic bile ducts within the left lobe of the liver. Right-sided bile ducts are decompressed after recent placement of a biliary drainage  catheter. Biliary sludge is noted in the gallbladder lumen. IMPRESSION: Liver lesions are occult by ultrasound and ultrasound-guided liver biopsy was not able to be performed. There remains dilatation of left lobe intrahepatic bile ducts. Electronically Signed   By: Aletta Edouard M.D.   On: 10/29/2017 13:45   Ir Int Lianne Cure Biliary Drain With Cholangiogram  Result Date: 10/26/2017 INDICATION: 62 year old male with a history of cirrhosis, presumed HCC, portal vein thrombosis, and jaundice with evidence of obstructed biliary system at the liver hilum. EXAM: IMAGE GUIDED PERCUTANEOUS TRANSHEPATIC CHOLANGIOGRAM WITH INTERNAL/EXTERNAL BILIARY DRAINAGE MEDICATIONS: Ciprofloxacin 400 mg IV; The antibiotic was administered within an appropriate time frame prior to the initiation of the procedure. 4 MG IV ZOFRAN ANESTHESIA/SEDATION: Moderate (conscious) sedation was employed during this procedure. A total of Versed 4.0 mg and Fentanyl 100 mcg was administered intravenously. Moderate Sedation Time: 17 MINUTES minutes. The patient's level of consciousness and vital signs were monitored continuously by radiology nursing throughout the procedure under my direct supervision. FLUOROSCOPY TIME:  Fluoroscopy Time: 3 minutes 18 seconds (175 mGy). COMPLICATIONS: None PROCEDURE: The procedure, risks, benefits, and alternatives were explained to the patient and the patient's family. A complete informed consent was performed, with risk benefit analysis. Specific risks that were discussed for the procedure include bleeding, infection, biliary sepsis, IC use day, organ injury, need for further procedure, need for further surgery, long-term drain placement, cardiopulmonary collapse, death. Questions regarding the procedure were encouraged and answered. The patient understands and consents to the procedure. Patient is position in supine position on the fluoroscopy table, and the upper abdomen was prepped and draped in the usual sterile  fashion. Maximum barrier sterile technique with sterile gowns and gloves were used for the procedure. A timeout was performed prior to the initiation of the procedure. Local anesthesia was provided with 1% lidocaine with epinephrine. Ultrasound survey of the left liver lobe was performed, with then ultrasound of the right liver lobe. 1% lidocaine was used for local anesthesia, with generous infiltration of the skin and subcutaneous tissues in and inter left costal location. Formal PTC puncture of the right liver was performed from intercostal approach. Once the tip of the needle was confirmed within the biliary system by observing for spontaneous bile return, and injecting small aliquots of contrast, images were stored of the biliary system after partially opacifying the biliary tree via the needle. A second access was required for access into the biliary system, from similar right-sided intercostal location. Once the tip of this second needle was confirmed within the biliary system, an 018 wire was advanced centrally. The needle was removed, a small incision was made with an 11 blade scalpel, and then a triaxial Accustick system was advanced into the biliary system. The metal stiffener and dilator were removed, we confirmed placement with contrast infusion. A coaxial Glidewire and 4 French glide cath were then used to navigate across the obstruction at the hilum of the liver. Once the catheter was presumed to be within the duodenum, the wire was removed and contrast confirmed location. A stiff Amplatz wire was advanced through the system, and the Accustick and Glidewire were removed. Dilation of the subcutaneous tissue tracks was performed with an 8 [place] dilator, and then a 10 [place] biliary drain was placed as an internal/external biliary drain. Small amount of contrast confirmed  location. The patient remained hemodynamically stable throughout. Placement of the drain resulted in significant nausea. We elected to  defer the left-sided drainage at this time. No complications were encountered and no significant blood loss was encountered. IMPRESSION: Status post formal PTC and internal/external biliary drainage. Signed, Dulcy Fanny. Dellia Nims, RPVI [facility] Radiology Electronically Signed   By: Corrie Mckusick D.O.   On: 10/26/2017 14:45    Labs:  CBC: Recent Labs    10/26/17 0539 10/27/17 0249 10/28/17 0221 10/29/17 0632  WBC 6.7 7.6 8.1 6.8  HGB 11.9* 11.6* 10.6* 9.9*  HCT 36.1* 34.8* 31.6* 30.0*  PLT 198 231 245 262    COAGS: Recent Labs    04/02/17 1346 10/23/17 1918 10/25/17 0551 10/26/17 0539  INR 1.2* 1.07 1.15 1.12    BMP: Recent Labs    10/26/17 0539 10/27/17 0249 10/28/17 0221 10/29/17 0632  NA 131* 127* 127* 130*  K 4.5 3.9 4.3 4.6  CL 98 95* 95* 95*  CO2 _0 GLUCOSE 143* 121* 150* 144*  BUN 8 8 7* 7*  CALCIUM 8.8* 8.6* 8.4* 8.4*  CREATININE 0.42* 0.39* 0.52* 0.47*  GFRNONAA >60 >60 >60 >60  GFRAA >60 >60 >60 >60    LIVER FUNCTION TESTS: Recent Labs    10/26/17 0539 10/27/17 0249 10/28/17 0221 10/29/17 0632  BILITOT 19.5* 12.6* 9.3* 8.2*  AST 203* 134* 80* 79*  ALT 194* 166* 122* 94*  ALKPHOS 762* 721* 546* 439*  PROT 7.1 7.4 7.2 7.2  ALBUMIN 2.3* 2.3* 2.3* 2.3*    Assessment and Plan: Biliary obstruction s/p internal/external PTC drain from right-sided approach Patient is improved s/p drain placement yesterday.  Underwent liver lesion biopsy today.  T bili down to 8.2 WBC remain WNL.   Patient to discharge home today.  Will place outpatient follow-up orders.  Patient will hear from schedulers with date and time of appointment.   Continue current management.   Electronically Signed: Docia Barrier, PA 10/29/2017, 3:20 PM   I spent a total of 15 Minutes at the the patient's bedside AND on the patient's hospital floor or unit, greater than 50% of which was  counseling/coordinating care for biliary obstruction.

## 2017-10-29 NOTE — Care Management Note (Signed)
Case Management Note  Patient Details  Name: Matthew Lewis MRN: [id number] Date of Birth: 1955-02-13  Subjective/Objective:                  Discharged to home with home self care  Action/Plan: Will follow for progression of care and clinical status. Will follow for case management needs none present at this time.  Expected Discharge Date:  10/29/17               Expected Discharge Plan:  Home/Self Care  In-House Referral:     Discharge planning Services  CM Consult  Post Acute Care Choice:    Choice offered to:     DME Arranged:    DME Agency:     HH Arranged:    HH Agency:     Status of Service:  Completed, signed off  If discussed at [employer] of Stay Meetings, dates discussed:    Additional Comments:  [name], RN 10/29/2017, 1:39 PM

## 2017-10-30 ENCOUNTER — Other Ambulatory Visit: Payer: Self-pay | Admitting: Hematology

## 2017-10-30 DIAGNOSIS — K831 Obstruction of bile duct: Secondary | ICD-10-CM

## 2017-10-31 ENCOUNTER — Telehealth: Payer: Self-pay

## 2017-10-31 ENCOUNTER — Telehealth: Payer: Self-pay | Admitting: Hematology

## 2017-10-31 ENCOUNTER — Ambulatory Visit (HOSPITAL_COMMUNITY)
Admission: RE | Admit: 2017-10-31 | Discharge: 2017-10-31 | Disposition: A | Discharge status: Home / Self Care | Payer: Medicare Other | Source: Ambulatory Visit | Attending: Student | Admitting: Student

## 2017-10-31 ENCOUNTER — Other Ambulatory Visit (HOSPITAL_COMMUNITY): Payer: Self-pay | Admitting: Student

## 2017-10-31 ENCOUNTER — Other Ambulatory Visit: Payer: Self-pay

## 2017-10-31 ENCOUNTER — Encounter (HOSPITAL_COMMUNITY): Payer: Self-pay

## 2017-10-31 DIAGNOSIS — K831 Obstruction of bile duct: Secondary | ICD-10-CM

## 2017-10-31 DIAGNOSIS — C7970 Secondary malignant neoplasm of unspecified adrenal gland: Secondary | ICD-10-CM

## 2017-10-31 MED FILL — NORMAL SALINE FLUSH SYRINGE: 0.9 | 15 days supply | Qty: 300 | Fill #0

## 2017-10-31 NOTE — Telephone Encounter (Signed)
He is instructed and scheduled. He will be here on 11/04/17.

## 2017-10-31 NOTE — Telephone Encounter (Signed)
I spoke with pathologist Dr. Tresa Moore this morning, his adrenal mass biopsy confirmed adenocarcinoma, likely colorectal primary.  I have sent a message to Dr. Cranford Mon, she will schedule a colonoscopy for patient next week.  I called patient and discussed the above, he agrees with the plan.  He wants to keep his appointment with me next Tuesday for now.   [name]  10/31/2017

## 2017-10-31 NOTE — Telephone Encounter (Signed)
Called to the patient. No answer. No voicemail. Called Matthew Lewis on [phone] and left a message requesting a call back.

## 2017-10-31 NOTE — Telephone Encounter (Signed)
-----   Message from Mauri Pole, MD sent at 10/31/2017 10:57 AM EDT ----- Regarding: FW: [facility] patient Beth, can you please bring him in for colonoscopy this coming week. Has metastatic cancer, adrenal biopsy suggestive of adenoca of possible colorectal origin.  Thanks VN ----- Message ----- From: Irving Copas., MD Sent: 10/29/2017   7:29 AM EDT To: Willia Craze, NP, Jerene Bears, MD, # Subject: [facility] patient                                 Margarette Asal, I hope you are well this AM. Your patient is being worked up now for multifocal [place] vs Metastases. Getting lots of biopsies and already s/p 1 PBD. LFTs downtrending. IR and Oncology are primarily driving the show currently. We are going to step away and be around peripherally if needed. Just an FYI. Thanks.  Chester Holstein

## 2017-10-31 NOTE — Telephone Encounter (Signed)
Ok thanks 

## 2017-10-31 NOTE — Progress Notes (Signed)
Patient called stating his biliary drain was "clogged."  Brought to IR department for evaluation. Patient with functioning biliary drain.  He continues to have blood-tinged output which has coagulated in his collecting bag.  He confirms this is the clogging he is experiencing.  Tubing is open.  Site clean and dry. Patient not having any difficulty with flushing.  States he is aspirating with flushing.  Instructed patient to flush FORWARD only.  Empty bag every few hours to minimize clogging.  Provided patient with a spare bag to be used as needed and new prescription for saline flushes.  He has an outpatient appointment scheduled for 10/31.  Encouraged patient to keep this appointment.   [name], MS RD PA-C 3:44 PM

## 2017-11-04 ENCOUNTER — Ambulatory Visit: Payer: Medicare Other | Admitting: Nurse Practitioner

## 2017-11-04 ENCOUNTER — Telehealth: Payer: Self-pay | Admitting: Nurse Practitioner

## 2017-11-04 ENCOUNTER — Encounter: Payer: Self-pay | Admitting: Gastroenterology

## 2017-11-04 ENCOUNTER — Ambulatory Visit (AMBULATORY_SURGERY_CENTER): Payer: Medicare Other | Admitting: Gastroenterology

## 2017-11-04 ENCOUNTER — Other Ambulatory Visit: Payer: Medicare Other

## 2017-11-04 ENCOUNTER — Other Ambulatory Visit: Payer: Self-pay | Admitting: Nurse Practitioner

## 2017-11-04 VITALS — BP 121/59 | HR 95 | Temp 97.5°F | Resp 19 | Ht 71.0 in | Wt 244.0 lb

## 2017-11-04 DIAGNOSIS — C7970 Secondary malignant neoplasm of unspecified adrenal gland: Secondary | ICD-10-CM

## 2017-11-04 DIAGNOSIS — C787 Secondary malignant neoplasm of liver and intrahepatic bile duct: Secondary | ICD-10-CM | POA: Diagnosis not present

## 2017-11-04 DIAGNOSIS — C7971 Secondary malignant neoplasm of right adrenal gland: Secondary | ICD-10-CM

## 2017-11-04 DIAGNOSIS — Z85038 Personal history of other malignant neoplasm of large intestine: Secondary | ICD-10-CM | POA: Diagnosis not present

## 2017-11-04 MED ORDER — DEXTROSE 50 % IV SOLN: 1.0000 | Freq: Once | INTRAVENOUS | Status: DC

## 2017-11-04 MED ORDER — SODIUM CHLORIDE 0.9 % IV SOLN: 500.0000 mL | Freq: Once | INTRAVENOUS | Status: DC

## 2017-11-04 MED ORDER — DEXTROSE 50 % IV SOLN
1.0000 | Freq: Once | INTRAVENOUS | Status: AC
  Administered 2017-11-04: 50 mL via INTRAVENOUS

## 2017-11-04 MED ORDER — DEXTROSE 5 % IV SOLN: INTRAVENOUS | Status: DC

## 2017-11-04 NOTE — Progress Notes (Signed)
Pt's states no medical or surgical changes since previsit or office visit.  Cory Art therapist) notified of blood sugar (64). States ok to hang D5W IV and will let Dr Silverio Decamp know.   Tommi Rumps states to push Amp of Dextrose IV now.

## 2017-11-04 NOTE — Op Note (Addendum)
[place] Patient Name: Matthew Lewis Procedure Date: 11/04/2017 8:36 AM MRN: [id number] Endoscopist: Mauri Pole , MD Age: 62 Referring MD:  Date of Birth: 1956-01-06 Gender: Male Account #: [id number] Procedure:                Colonoscopy Indications:              Suspected colorectal cancer, Metastatic malignancy                            to liver s/p biopsy. Lung and adrenal mass based on                            CT. History of colon cancer s/p resection in 2007 Medicines:                Monitored Anesthesia Care Procedure:                Pre-Anesthesia Assessment:                           - Prior to the procedure, a History and Physical                            was performed, and patient medications and                            allergies were reviewed. The patient's tolerance of                            previous anesthesia was also reviewed. The risks                            and benefits of the procedure and the sedation                            options and risks were discussed with the patient.                            All questions were answered, and informed consent                            was obtained. Prior Anticoagulants: The patient has                            taken no previous anticoagulant or antiplatelet                            agents. ASA Grade Assessment: III - A patient with                            severe systemic disease. After reviewing the risks                            and benefits, the patient was deemed in  satisfactory condition to undergo the procedure.                           After obtaining informed consent, the colonoscope                            was passed under direct vision. Throughout the                            procedure, the patient's blood pressure, pulse, and                            oxygen saturations were monitored continuously. The    Model PCF-H190DL [id number]) scope was introduced                            through the anus and advanced to the the                            ileocolonic anastomosis. The colonoscopy was                            performed without difficulty. The patient tolerated                            the procedure well. The quality of the bowel                            preparation was adequate to identify polyps 6 mm                            and larger in size. Ileocolonic anastomosis were                            photographed. Scope In: 8:49:01 AM Scope Out: 9:04:16 AM Scope Withdrawal Time: 0 hours 13 minutes 44 seconds  Total Procedure Duration: 0 hours 15 minutes 15 seconds  Findings:                 The digital rectal exam findings include seton in                            place.                           An area of mildly congested mucosa was found in the                            rectum, in the sigmoid colon and in the descending                            colon.                           A few sessile polyps were found in the rectum and  sigmoid colon. The polyps were diminutive in size.                            Not removed                           No definitive mass or malignant stricture Complications:            No immediate complications. Estimated Blood Loss:     Estimated blood loss: none. Impression:               Delphina Cahill in place found on digital rectal exam.                           - Congested mucosa in the rectum, in the sigmoid                            colon and in the descending colon.                           - A few diminutive polyps in the rectum and in the                            sigmoid colon. No colon or rectal mass identified.                           - No specimens collected. Recommendation:           - Patient has a contact number available for                            emergencies. The signs and symptoms of  potential                            delayed complications were discussed with the                            patient. Return to normal activities tomorrow.                            Written discharge instructions were provided to the                            patient.                           - Resume previous diet.                           - Continue present medications.                           - Follow up with Dr Burr Medico for metastatic liver                            adenocancer Mauri Pole, MD 11/04/2017 9:14:36  AM This report has been signed electronically.

## 2017-11-04 NOTE — Telephone Encounter (Signed)
R/s appt per 10/29 sch message - pt is aware of appt date and time

## 2017-11-04 NOTE — Progress Notes (Signed)
Blood sugar rechecked with a reading of 209 obtained.

## 2017-11-04 NOTE — Patient Instructions (Signed)
  Handout given on polyps--none removed today   YOU HAD AN ENDOSCOPIC PROCEDURE TODAY AT [facility]:   Refer to the procedure report that was given to you for any specific questions about what was found during the examination.  If the procedure report does not answer your questions, please call your gastroenterologist to clarify.  If you requested that your care partner not be given the details of your procedure findings, then the procedure report has been included in a sealed envelope for you to review at your convenience later.  YOU SHOULD EXPECT: Some feelings of bloating in the abdomen. Passage of more gas than usual.  Walking can help get rid of the air that was put into your GI tract during the procedure and reduce the bloating. If you had a lower endoscopy (such as a colonoscopy or flexible sigmoidoscopy) you may notice spotting of blood in your stool or on the toilet paper. If you underwent a bowel prep for your procedure, you may not have a normal bowel movement for a few days.  Please Note:  You might notice some irritation and congestion in your nose or some drainage.  This is from the oxygen used during your procedure.  There is no need for concern and it should clear up in a day or so.  SYMPTOMS TO REPORT IMMEDIATELY:   Following lower endoscopy (colonoscopy or flexible sigmoidoscopy):  Excessive amounts of blood in the stool  Significant tenderness or worsening of abdominal pains  Swelling of the abdomen that is new, acute  Fever of 100F or higher   For urgent or emergent issues, a gastroenterologist can be reached at any hour by calling [phone].   DIET:  We do recommend a small meal at first, but then you may proceed to your regular diet.  Drink plenty of fluids but you should avoid alcoholic beverages for 24 hours.  ACTIVITY:  You should plan to take it easy for the rest of today and you should NOT DRIVE or use heavy machinery until tomorrow (because  of the sedation medicines used during the test).    FOLLOW UP: Our staff will call the number listed on your records the next business day following your procedure to check on you and address any questions or concerns that you may have regarding the information given to you following your procedure. If we do not reach you, we will leave a message.  However, if you are feeling well and you are not experiencing any problems, there is no need to return our call.  We will assume that you have returned to your regular daily activities without incident.  If any biopsies were taken you will be contacted by phone or by letter within the next 1-3 weeks.  Please call us at [phone] if you have not heard about the biopsies in 3 weeks.    SIGNATURES/CONFIDENTIALITY: You and/or your care partner have signed paperwork which will be entered into your electronic medical record.  These signatures attest to the fact that that the information above on your After Visit Summary has been reviewed and is understood.  Full responsibility of the confidentiality of this discharge information lies with you and/or your care-partner.

## 2017-11-04 NOTE — Progress Notes (Signed)
Report given to PACU, vss 

## 2017-11-05 ENCOUNTER — Other Ambulatory Visit: Payer: Self-pay | Admitting: Hematology

## 2017-11-05 ENCOUNTER — Telehealth: Payer: Self-pay | Admitting: *Deleted

## 2017-11-05 NOTE — Telephone Encounter (Signed)
  Follow up Call-  Call back number 11/04/2017 06/19/2016  Post procedure Call Back phone  # [phone]  Permission to leave phone message Yes Yes  Some recent data might be hidden     Patient questions:  Do you have a fever, pain , or abdominal swelling? No. Pain Score  0 *  Have you tolerated food without any problems? Yes.    Have you been able to return to your normal activities? Yes.    Do you have any questions about your discharge instructions: Diet   No. Medications  No. Follow up visit  No.  Do you have questions or concerns about your Care? No.  Actions: * If pain score is 4 or above: No action needed, pain <4.

## 2017-11-06 ENCOUNTER — Ambulatory Visit
Admission: RE | Admit: 2017-11-06 | Discharge: 2017-11-06 | Disposition: A | Discharge status: Home / Self Care | Payer: Medicare Other | Source: Ambulatory Visit | Attending: Hematology | Admitting: Hematology

## 2017-11-06 ENCOUNTER — Inpatient Hospital Stay (HOSPITAL_BASED_OUTPATIENT_CLINIC_OR_DEPARTMENT_OTHER): Payer: Medicare Other | Admitting: Nurse Practitioner

## 2017-11-06 ENCOUNTER — Other Ambulatory Visit (HOSPITAL_COMMUNITY): Payer: Self-pay | Admitting: Interventional Radiology

## 2017-11-06 ENCOUNTER — Other Ambulatory Visit: Payer: Self-pay | Admitting: Hematology

## 2017-11-06 ENCOUNTER — Ambulatory Visit
Admission: RE | Admit: 2017-11-06 | Discharge: 2017-11-06 | Disposition: A | Discharge status: Home / Self Care | Payer: Medicare Other | Source: Ambulatory Visit | Attending: Student | Admitting: Student

## 2017-11-06 ENCOUNTER — Encounter: Payer: Self-pay | Admitting: Nurse Practitioner

## 2017-11-06 ENCOUNTER — Inpatient Hospital Stay: Payer: Medicare Other

## 2017-11-06 VITALS — BP 110/69 | HR 110 | Temp 97.7°F | Resp 18 | Ht 77.0 in | Wt 220.4 lb

## 2017-11-06 DIAGNOSIS — F1010 Alcohol abuse, uncomplicated: Secondary | ICD-10-CM | POA: Diagnosis not present

## 2017-11-06 DIAGNOSIS — C7971 Secondary malignant neoplasm of right adrenal gland: Secondary | ICD-10-CM | POA: Diagnosis not present

## 2017-11-06 DIAGNOSIS — K831 Obstruction of bile duct: Secondary | ICD-10-CM

## 2017-11-06 DIAGNOSIS — Z981 Arthrodesis status: Secondary | ICD-10-CM | POA: Diagnosis not present

## 2017-11-06 DIAGNOSIS — I82439 Acute embolism and thrombosis of unspecified popliteal vein: Secondary | ICD-10-CM

## 2017-11-06 DIAGNOSIS — C7972 Secondary malignant neoplasm of left adrenal gland: Secondary | ICD-10-CM | POA: Diagnosis not present

## 2017-11-06 DIAGNOSIS — R0602 Shortness of breath: Secondary | ICD-10-CM | POA: Diagnosis not present

## 2017-11-06 DIAGNOSIS — E119 Type 2 diabetes mellitus without complications: Secondary | ICD-10-CM

## 2017-11-06 DIAGNOSIS — Z85038 Personal history of other malignant neoplasm of large intestine: Secondary | ICD-10-CM | POA: Diagnosis not present

## 2017-11-06 DIAGNOSIS — C7800 Secondary malignant neoplasm of unspecified lung: Secondary | ICD-10-CM | POA: Diagnosis not present

## 2017-11-06 DIAGNOSIS — Z7982 Long term (current) use of aspirin: Secondary | ICD-10-CM

## 2017-11-06 DIAGNOSIS — E46 Unspecified protein-calorie malnutrition: Secondary | ICD-10-CM

## 2017-11-06 DIAGNOSIS — K7030 Alcoholic cirrhosis of liver without ascites: Secondary | ICD-10-CM | POA: Diagnosis not present

## 2017-11-06 DIAGNOSIS — Z85118 Personal history of other malignant neoplasm of bronchus and lung: Secondary | ICD-10-CM | POA: Diagnosis not present

## 2017-11-06 DIAGNOSIS — E440 Moderate protein-calorie malnutrition: Secondary | ICD-10-CM | POA: Diagnosis not present

## 2017-11-06 DIAGNOSIS — A419 Sepsis, unspecified organism: Secondary | ICD-10-CM | POA: Diagnosis not present

## 2017-11-06 DIAGNOSIS — A4159 Other Gram-negative sepsis: Secondary | ICD-10-CM | POA: Diagnosis not present

## 2017-11-06 DIAGNOSIS — C221 Intrahepatic bile duct carcinoma: Secondary | ICD-10-CM

## 2017-11-06 DIAGNOSIS — K529 Noninfective gastroenteritis and colitis, unspecified: Secondary | ICD-10-CM | POA: Diagnosis not present

## 2017-11-06 DIAGNOSIS — Z515 Encounter for palliative care: Secondary | ICD-10-CM | POA: Diagnosis not present

## 2017-11-06 DIAGNOSIS — C772 Secondary and unspecified malignant neoplasm of intra-abdominal lymph nodes: Secondary | ICD-10-CM | POA: Diagnosis not present

## 2017-11-06 DIAGNOSIS — C787 Secondary malignant neoplasm of liver and intrahepatic bile duct: Secondary | ICD-10-CM | POA: Insufficient documentation

## 2017-11-06 DIAGNOSIS — E1142 Type 2 diabetes mellitus with diabetic polyneuropathy: Secondary | ICD-10-CM | POA: Diagnosis not present

## 2017-11-06 DIAGNOSIS — R109 Unspecified abdominal pain: Secondary | ICD-10-CM | POA: Diagnosis not present

## 2017-11-06 DIAGNOSIS — I10 Essential (primary) hypertension: Secondary | ICD-10-CM | POA: Diagnosis not present

## 2017-11-06 DIAGNOSIS — R000 Tachycardia, unspecified: Secondary | ICD-10-CM | POA: Diagnosis not present

## 2017-11-06 DIAGNOSIS — Z7189 Other specified counseling: Secondary | ICD-10-CM

## 2017-11-06 DIAGNOSIS — Z885 Allergy status to narcotic agent status: Secondary | ICD-10-CM | POA: Diagnosis not present

## 2017-11-06 DIAGNOSIS — Z955 Presence of coronary angioplasty implant and graft: Secondary | ICD-10-CM | POA: Diagnosis not present

## 2017-11-06 DIAGNOSIS — Z79899 Other long term (current) drug therapy: Secondary | ICD-10-CM

## 2017-11-06 DIAGNOSIS — T85520A Displacement of bile duct prosthesis, initial encounter: Secondary | ICD-10-CM | POA: Diagnosis not present

## 2017-11-06 DIAGNOSIS — E871 Hypo-osmolality and hyponatremia: Secondary | ICD-10-CM | POA: Diagnosis not present

## 2017-11-06 DIAGNOSIS — I81 Portal vein thrombosis: Secondary | ICD-10-CM | POA: Diagnosis not present

## 2017-11-06 DIAGNOSIS — I2510 Atherosclerotic heart disease of native coronary artery without angina pectoris: Secondary | ICD-10-CM | POA: Diagnosis not present

## 2017-11-06 DIAGNOSIS — Z9049 Acquired absence of other specified parts of digestive tract: Secondary | ICD-10-CM | POA: Diagnosis not present

## 2017-11-06 DIAGNOSIS — E785 Hyperlipidemia, unspecified: Secondary | ICD-10-CM | POA: Diagnosis not present

## 2017-11-06 HISTORY — PX: IR RADIOLOGIST EVAL & MGMT: IMG5224

## 2017-11-06 LAB — CMP (CANCER CENTER ONLY)
ALT: 57 U/L — ABNORMAL HIGH (ref 0–44)
AST: 75 U/L — ABNORMAL HIGH (ref 15–41)
Albumin: 1.9 g/dL — ABNORMAL LOW (ref 3.5–5.0)
Alkaline Phosphatase: 467 U/L — ABNORMAL HIGH (ref 38–126)
Anion gap: 10 (ref 5–15)
BUN: 10 mg/dL (ref 8–23)
CALCIUM: 9 mg/dL (ref 8.9–10.3)
CHLORIDE: 98 mmol/L (ref 98–111)
CO2: 23 mmol/L (ref 22–32)
CREATININE: 0.62 mg/dL (ref 0.61–1.24)
Glucose, Bld: 136 mg/dL — ABNORMAL HIGH (ref 70–99)
Potassium: 4.3 mmol/L (ref 3.5–5.1)
SODIUM: 131 mmol/L — AB (ref 135–145)
Total Bilirubin: 4.5 mg/dL (ref 0.3–1.2)
Total Protein: 7.5 g/dL (ref 6.5–8.1)

## 2017-11-06 LAB — CBC WITH DIFFERENTIAL (CANCER CENTER ONLY)
Abs Immature Granulocytes: 0.05 10*3/uL (ref 0.00–0.07)
BASOS PCT: 0 %
Basophils Absolute: 0 10*3/uL (ref 0.0–0.1)
EOS ABS: 0.1 10*3/uL (ref 0.0–0.5)
Eosinophils Relative: 1 %
HCT: 31 % — ABNORMAL LOW (ref 39.0–52.0)
Hemoglobin: 10.1 g/dL — ABNORMAL LOW (ref 13.0–17.0)
Immature Granulocytes: 0 %
Lymphocytes Relative: 5 %
Lymphs Abs: 0.6 10*3/uL — ABNORMAL LOW (ref 0.7–4.0)
MCH: 30.6 pg (ref 26.0–34.0)
MCHC: 32.6 g/dL (ref 30.0–36.0)
MCV: 93.9 fL (ref 80.0–100.0)
MONO ABS: 0.9 10*3/uL (ref 0.1–1.0)
Monocytes Relative: 7 %
Neutro Abs: 10.1 10*3/uL — ABNORMAL HIGH (ref 1.7–7.7)
Neutrophils Relative %: 87 %
PLATELETS: 301 10*3/uL (ref 150–400)
RBC: 3.3 MIL/uL — AB (ref 4.22–5.81)
RDW: 17.2 % — ABNORMAL HIGH (ref 11.5–15.5)
WBC: 11.8 10*3/uL — AB (ref 4.0–10.5)
nRBC: 0 % (ref 0.0–0.2)

## 2017-11-06 LAB — CEA (IN HOUSE-CHCC): CEA (CHCC-In House): 1081.82 ng/mL — ABNORMAL HIGH (ref 0.00–5.00)

## 2017-11-06 LAB — IRON AND TIBC
IRON: 28 ug/dL — AB (ref 42–163)
SATURATION RATIOS: 15 % — AB (ref 20–55)
TIBC: 188 ug/dL — ABNORMAL LOW (ref 202–409)
UIBC: 160 ug/dL (ref 117–376)

## 2017-11-06 LAB — FERRITIN: Ferritin: 366 ng/mL — ABNORMAL HIGH (ref 24–336)

## 2017-11-06 MED ORDER — IOPAMIDOL (ISOVUE-300) INJECTION 61%
100.0000 mL | Freq: Once | INTRAVENOUS | Status: AC | PRN
  Administered 2017-11-06: 100 mL via INTRAVENOUS

## 2017-11-06 MED ORDER — ONDANSETRON HCL 8 MG PO TABS: 8.0000 mg | ORAL_TABLET | Freq: Two times a day (BID) | ORAL | 1 refills | Status: DC | PRN

## 2017-11-06 MED ORDER — PROCHLORPERAZINE MALEATE 10 MG PO TABS: 10.0000 mg | ORAL_TABLET | Freq: Four times a day (QID) | ORAL | 1 refills | Status: DC | PRN

## 2017-11-06 NOTE — Progress Notes (Addendum)
[place]  [phone] [phone]  Clinic Follow up Note   Patient Care Team: [name], MD as PCP - General (Internal Medicine) 11/06/2017  SUMMARY OF ONCOLOGIC HISTORY:   Malignant neoplasm metastatic to adrenal gland [facility])    Initial Diagnosis    Malignant neoplasm metastatic to adrenal gland ([place])    10/23/2017 Imaging    ABD US IMPRESSION: Cirrhotic appearance of the liver.  2.6 cm questionable ill-defined mass versus area of focal parenchymal heterogeneity in segment 6 of the liver. If further imaging evaluation is desired, liver protocol abdominal MRI should be considered.  Splenomegaly.  Apparent thickening of the gallbladder wall, without evidence of cholelithiasis, and with negative sonographic Murphy's sign. The findings are favored to represent gallbladder wall edema secondary to systemic causes such as hyperproteinemia rather than acute cholecystitis. Please correlate clinically.     10/25/2017 Imaging    MRI IMPRESSION: 1. Advanced changes of cirrhosis. Interval development of multifocal enhancing liver lesions, which is concerning for multicentric hepatocellular carcinoma involving both lobes of liver. 2. Lesion within segment 4 B obstructs the right medial hepatic duct and left main duct resulting in moderate to marked biliary ductal dilatation within the left lobe and dome of liver. There may be partial obstruction of the right lateral duct with mild dilatation. 3. Tumor thrombus within the left branch of the portal vein likely secondary to segment 4 B tumor. 4. Bilateral adrenal metastasis and retroperitoneal nodal metastasis. 5. Stigmata of portal venous hypertension including varices, ascites and splenomegaly. 6. These results will be called to the ordering clinician or representative by the Radiologist Assistant, and communication documented in the PACS or zVision Dashboard.    10/26/2017 Procedure   IMPRESSION: Status post formal PTC and internal/external biliary drainage.    10/27/2017 Imaging    IMPRESSION: Changes of cirrhosis. Multiple hepatic lesions are noted concerning for multifocal hepatoma or metastases. Biliary drain is in place with the tip in the distal common bile duct. There is mild intrahepatic biliary ductal dilatation. Bilateral adrenal nodules and masses, right larger than left compatible with metastases. Retroperitoneal adenopathy. Large stool burden with mild gaseous distention of the colon. Aortic atherosclerosis.     10/29/2017 Pathology Results    Diagnosis Soft Tissue Needle Core Biopsy, right adrenal mass - METASTATIC ADENOCARCINOMA, SEE COMMENT. Microscopic Comment There are malignant glands with central necrosis. Immunohistochemistry is positive for cytokeratin 20 and CDX-2. Cytokeratin 7, TTF-1 and NapsinA are negative. The morphology and immunoprofile favor a colorectal primary over pancreaticobiliary. [name] has reviewed the case. [name] was notified on 10/31/2017.    11/04/2017 Procedure    Colonoscopy impression - Seton in place found on digital rectal exam. - Congested mucosa in the rectum, in the sigmoid colon and in the descending colon. - A few diminutive polyps in the rectum and in the sigmoid colon. No colon or rectal mass identified. - No specimens collected.    HISTORY OF PRESENT ILLNESS -per Dr. Ernestina Penna inpatient consult note 10/27/17 62 year old Caucasian male, with past medical history of stage II descending colon cancer in 2007, s/p left hemicolectomy, recurrent perianal stool and abscess, last surgery in September 2019, multiple substance abuse including alcohol, liver cirrhosis from alcohol and [name], presented with abdominal pain and jaundice for a few weeks, and 20 pound weight loss in the past few months.  He states his pain is located in the upper abdomen, persistent, associated with nausea and low appetite, no change of his  bowel habits,  no melena or hematochezia.  He was referred to emergency room by his primary care physician after abnormal labs.  He was hospitalized for acute duodenitis in 2018, and was found to have liver cirrhosis, which was felt to be related to his alcohol and [name].  He has stopped alcohol drinking and other multiple substance abuse about 6 years ago.  He has been seeing GI doctor since then. EGD 06/2016 per Dr. Silverio Decamp showed varices, last colonoscopy was 5 years ago.   He underwent MRI of abdomen with MRCP in the hospital, which showed multiple liver lesions, bilateral adrenal gland masses and retroperitoneal adenopathy concerning for metastasis.  He was seen by GI and IR, and underwent percutaneous biliary drainage tube placement on 10/26/17. He underwent CT biopsy of right adrenal mass on 10/29/17 that confirms metastatic adenocarcinoma with morphology that favors colorectal primary over pancreatobiliary. He was discharged same day.   He is married, lives with his wife, has 2 children.  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  He has been feeling quite fatigued lately, still able to function at home.  His appetite has decreased, lost about 20 pounds in the past few months.   INTERVAL HISTORY: Mr. Moder returns for first outpatient follow up. He underwent colonoscopy on 11/04/17 that shows seton in place and a few polyps in the rectum and sigmoid colon, otherwise no colon or rectal mass. No biopsies were collected. He presents today with his son. He feels "rough" with significant fatigue. His activity tolerance is low, he is able to perform ADLs only with frequent rest. His appetite remains low with little to eat or drink lately. Has had no BM since colonoscopy prep this week. Urine is lighter, no dysuria. He has mild-moderate upper abdominal pain that is consistent. He prefers to take advil instead of opioids due to h/o addiction now clean 6 years. Biliary drain output is bloody, amount  decreasing. He emptied it today for first time in 4-5 days, output is down from 2000 ml while hospitalized. Still slightly jaundiced. He denies fever, chills, cough, chest pain, dyspnea, or leg edema.     MEDICAL HISTORY:  Past Medical History:  Diagnosis Date  . Abscess of anal and rectal regions    horse shoe abscess  . Alcoholic cirrhosis ([place])   . Arthritis    "everywhere; especially in my spine"  . Cirrhosis ([place])    STAGE 1 CIRRHOSIS PATIENT SEES DR NAMDIGAM FOR  . Colon cancer ([place]) 05/2005   TX SURGERY WITH LYMPH NODE REMOVAL  . Cough    LAST FEW WEEKS SAW DR RAMOS LUNGS MILKY SPUTUM OCC  . DM type 2 with diabetic peripheral neuropathy ([facility])    left foot  . Fatigue   . Fistula, anal    multiple  . History of substance abuse ([place])    last alcohol was 08/2011; last marijuania was 08/2011  . Hypertension   . PTSD (post-traumatic stress disorder)    "severe" HX OF  . Sleep deprivation     SURGICAL HISTORY: Past Surgical History:  Procedure Laterality Date  . ABSCESS DRAINAGE     "probably 15-20 so far at least; rectal"  . ANAL FISTULECTOMY  12/13/1998  . ANTERIOR FUSION CERVICAL SPINE  2010   "triple"; "did 2 surgeries on the same day in 4098 due to complications"  . CORONARY ANGIOPLASTY  2001  . detached muscle  2010   right chest; "after cervical fusion complications"  . ELBOW SURGERY  2007   "  cut out part of a muscle"; left  . EXAMINATION UNDER ANESTHESIA  03/22/2005   fistula  . EXAMINATION UNDER ANESTHESIA  06/06/2011   Procedure: EXAM UNDER ANESTHESIA;  Surgeon: Stark Klein, MD;  Location: [facility];  Service: General;  Laterality: N/A;  . HEMICOLECTOMY  06/06/2005   left  . INCISION AND DRAINAGE PERIRECTAL ABSCESS  03/30/2005  . INCISION AND DRAINAGE PERIRECTAL ABSCESS  12/13/2004  . INCISION AND DRAINAGE PERIRECTAL ABSCESS  07/14/2001  . INCISION AND DRAINAGE PERIRECTAL ABSCESS  08/12/2010   horseshoe abscess; Dr Redmond Pulling  . INCISION AND DRAINAGE PERIRECTAL ABSCESS   06/06/2011   Procedure: IRRIGATION AND DEBRIDEMENT PERIRECTAL ABSCESS;  Surgeon: Stark Klein, MD;  Location: [facility];  Service: General;  Laterality: N/A;  . INCISION AND DRAINAGE PERIRECTAL ABSCESS N/A 02/24/2017   Procedure: IRRIGATION AND DEBRIDEMENT PERIRECTAL ABSCESS;  Surgeon: Ralene Ok, MD;  Location: [facility];  Service: General;  Laterality: N/A;  . IR INT EXT BILIARY DRAIN WITH CHOLANGIOGRAM  10/26/2017  . IR RADIOLOGIST EVAL & MGMT  11/06/2017  . Rafael Gonzalez  . PERCUTANEOUS PINNING PHALANX FRACTURE OF HAND  ~ 2008   "plates in 2 places"; right  . PLACEMENT OF SETON N/A 09/05/2017   Procedure: PLACEMENT OF SETON;  Surgeon: Leighton Ruff, MD;  Location: [facility];  Service: General;  Laterality: N/A;  . RADIOLOGY WITH ANESTHESIA N/A 10/25/2017   Procedure: MRI WITH ANESTHESIA;  Surgeon: Radiologist, Medication, MD;  Location: [place];  Service: Radiology;  Laterality: N/A;  . THORACIC DISCECTOMY  1990's    I have reviewed the social history and family history with the patient and they are unchanged from previous note.  ALLERGIES:  is allergic to morphine and related and other.  MEDICATIONS:  Current Outpatient Medications  Medication Sig Dispense Refill  . aspirin EC 81 MG tablet Take 81 mg by mouth daily.    Marland Kitchen atorvastatin (LIPITOR) 40 MG tablet Take 1 tablet (40 mg total) by mouth daily at 6 PM. (Patient taking differently: Take 20 mg by mouth daily at 6 PM. ) 30 tablet 0  . calcium carbonate (TUMS - DOSED IN MG ELEMENTAL CALCIUM) 500 MG chewable tablet Chew 1 tablet by mouth 2 (two) times daily as needed for indigestion or heartburn.    . celecoxib (CELEBREX) 200 MG capsule Take 1 capsule by mouth daily.  5  . empagliflozin (JARDIANCE) 10 MG TABS tablet Take 10 mg by mouth at bedtime.     Marland Kitchen Fexofenadine-Pseudoephedrine (ALLEGRA-D 24 HOUR PO) Take by mouth daily. WAL/FEX    . gabapentin (NEURONTIN) 400 MG capsule Take 400-800 mg by mouth 2 (two)  times daily. 1 capsule am, 2 capsules pm    . glipiZIDE (GLUCOTROL) 5 MG tablet Take 5 mg by mouth 2 (two) times daily before a meal.    . Insulin Detemir (LEVEMIR FLEXPEN) 100 UNIT/ML Pen Inject 10-60 Units into the skin 2 (two) times daily. Take 62 units in the am and Take 10 units in the pm    . lisinopril (PRINIVIL,ZESTRIL) 20 MG tablet Take 20 mg by mouth daily.    . metFORMIN (GLUCOPHAGE) 1000 MG tablet Take 1,000 mg by mouth 2 (two) times daily with a meal.    . Multiple Vitamin (MULTIVITAMIN) tablet Take 1 tablet by mouth daily.    Marland Kitchen oxyCODONE 10 MG TABS Take 1 tablet (10 mg total) by mouth every 4 (four) hours as needed for severe pain. 10 tablet 0  .  polyvinyl alcohol (LIQUIFILM TEARS) 1.4 % ophthalmic solution 1 drop as needed for dry eyes.     Current Facility-Administered Medications  Medication Dose Route Frequency Provider Last Rate Last Dose  . dextrose 5 % solution   Intravenous Continuous Nandigam, Kavitha V, MD      . dextrose 50 % solution 50 mL  1 ampule Intravenous Once Nandigam, Kavitha V, MD        PHYSICAL EXAMINATION: ECOG PERFORMANCE STATUS: 3 - Symptomatic, >50% confined to bed  Vitals:   11/06/17 0928  BP: 110/69  Pulse: (!) 110  Resp: 18  Temp: 97.7 F (36.5 C)  SpO2: 97%   Filed Weights   11/06/17 0928  Weight: 220 lb 6.4 oz (100 kg)    GENERAL:alert, no distress and comfortable SKIN:  no rashes or significant lesions. Mild jaundice  EYES: scleral icterus  OROPHARYNX:no thrush or ulcers  LYMPH:  no palpable cervical or supraclavicular lymphadenopathy LUNGS: clear to auscultation with normal breathing effort HEART: regular rate & rhythm, no lower extremity edema ABDOMEN: abdomen soft, normal bowel sounds. Mild tenderness to upper abdomen. Biliary tube site covered with gauze bandage, c/d/i, bag with bloody drainage   Musculoskeletal:no cyanosis of digits and no clubbing  NEURO: alert & oriented x 3 with fluent speech  LABORATORY DATA:  I have  reviewed the data as listed CBC Latest Ref Rng & Units 11/06/2017 10/29/2017 10/28/2017  WBC 4.0 - 10.5 K/uL 11.8(H) 6.8 8.1  Hemoglobin 13.0 - 17.0 g/dL 10.1(L) 9.9(L) 10.6(L)  Hematocrit 39.0 - 52.0 % 31.0(L) 30.0(L) 31.6(L)  Platelets 150 - 400 K/uL 301 262 245     CMP Latest Ref Rng & Units 11/06/2017 10/29/2017 10/28/2017  Glucose 70 - 99 mg/dL 136(H) 144(H) 150(H)  BUN 8 - 23 mg/dL 10 7(L) 7(L)  Creatinine 0.61 - 1.24 mg/dL 0.62 0.47(L) 0.52(L)  Sodium 135 - 145 mmol/L 131(L) 130(L) 127(L)  Potassium 3.5 - 5.1 mmol/L 4.3 4.6 4.3  Chloride 98 - 111 mmol/L 98 95(L) 95(L)  CO2 22 - 32 mmol/L _0 Calcium 8.9 - 10.3 mg/dL 9.0 8.4(L) 8.4(L)  Total Protein 6.5 - 8.1 g/dL 7.5 7.2 7.2  Total Bilirubin 0.3 - 1.2 mg/dL 4.5(HH) 8.2(H) 9.3(H)  Alkaline Phos 38 - 126 U/L 467(H) 439(H) 546(H)  AST 15 - 41 U/L 75(H) 79(H) 80(H)  ALT 0 - 44 U/L 57(H) 94(H) 122(H)      RADIOGRAPHIC STUDIES: I have personally reviewed the radiological images as listed and agreed with the findings in the report. Dg Sinus/fist Tube Chk-non Gi  Result Date: 11/06/2017 INDICATION: Internal external biliary drain, decreased output EXAM: FLUOROSCOPIC INJECTION OF THE RIGHT INTERNAL EXTERNAL BILIARY DRAIN MEDICATIONS: NONE. ANESTHESIA/SEDATION: NONE. COMPLICATIONS: None immediate. PROCEDURE: Under sterile conditions, the existing right internal external biliary drain was injected with fluoroscopic imaging performed. This demonstrates accumulation of contrast at the right liver edge. Drain has significantly retracted with the catheter tip positioned in the proximal CBD. Distal aspect of the biliary drain does not opacify. Therefore, the tube may also be occluded. There is little bile output from the catheter. IMPRESSION: Significant retraction of the internal external biliary drain now with the tip position in the proximal CBD. Contrast injection accumulates at the liver margin where exposed sideholes are likely  extrahepatic. Biliary system is not opacified. PLAN: Schedule for internal external biliary exchange and reposition tomorrow. Electronically Signed   By: Jerilynn Mages.  Shick M.D.   On: 11/06/2017 13:49   Ir Radiologist Eval & Mgmt  Result Date: 11/06/2017 Please refer to notes tab for details about interventional procedure. (Op Note)    ASSESSMENT & PLAN: 62 year old Caucasian male, with past medical history ofstage II descending colon cancer in 2007, that post left hemicolectomy, recurrent perianal stool and abscess, was last surgery in September 2019,  multiple substance abuse including alcohol, liver cirrhosis from alcohol and [name], presented with abdominal pain and jaundice for a few weeks, and 20 pound weight loss in the past few months.  1. metastatic adenocarcinoma to right adrenal gland, with multiple liver masses,, retroperitoneal adenopathy -Work up was negative for colorectal primary. His case was discussed in thoracic tumor board and is not felt to be lung primary. [name] reviewed with pathology and this is not felt to be [place]. AFP is normal. This is most likely cholangiocarcinoma, based on path, stains, and clinical presentation. [name] recommends PET scan to support the diagnosis  -[name] reviewed that given his metastatic disease, his cancer is not operable and is likely not curable with chemotherapy alone, but is treatable.  -[name] recommends palliative systemic cisplatin and gemcitabine given on days 1 and 8 every 21 days. The goal is to control his disease, to improve symptoms and quality of life, and prolong his life -Chemotherapy consent: Side effects including but not limited to, fatigue, nausea, vomiting, diarrhea, hair loss, neuropathy, hearing loss, fluid retention, renal and kidney dysfunction, neutropenic fever, need for blood transfusion, bleeding, were discussed with patient in great detail. He agrees to proceed, he is thankful there is treatment option available to him -FO  is pending, to see if he is a candidate for immunotherapy or other targeted therapy in addition to chemotherapy  -will arrange for him to attend chemo education class this week or next, we encouraged his son to attend  -Labs reviewed, bili trending down, now 4.5. CEA 1,081.82 Hgb stable  -His performance status and nutrition are low, he is not ready to begin chemotherapy yet. We encouraged him to eat and drink more, and increase activity at home to build his strength.  -plan to f/u and begin chemotherapy in 2 weeks  2.  Obstructive jaundice secondary to #1 -s/p external biliary drain on 10/29/17 per IR Dr. Kathlene Cote; drain is in place in distal common bile duct -transaminases and hyperbilirubinemia are improving as is his clinical juandice.  -Bili 4.5 today, drain output is decreasing -He will see IR today for ongoing eval -plan to begin chemo in 2 weeks, when bili has improved more   3. Malnutrition -He presented with 20 lbs weight loss over last few months  -Unclear if today's weight is accurate.  -I placed urgent dietician referral to evaluate him prior to chemo with ongoing f/u  4.  Liver cirrhosis secondary to alcohol and NASH  5. Portal vein thrombosis -on aspirin   6. DM, HTN -BG 130-150's lately, on empagliflozin, glipizide, insulin detemir, and metformin  -BP soft today, 110/69, on lisinopril   7. H/o substance abuse and addiction  -he attends meetings, has been clean 6 years  -He was prescribed oxycodone at hospital discharge for his abdominal pain, he has a clear source of his pain. He uses oxycodone sparingly.  -I referred him to SW today for ongoing support   PLAN: -Labs, colonoscopy, biopsy and path reviewed  -PET scan in 1 week  -Chemo class, dietician consult, and referral to SW over next week  -Plan to begin chemotherapy gemcitabine/cisplatin on days 1 and 8 every 21 days in 2  weeks  -F/u with first cycle chemo    Orders Placed This Encounter  Procedures  .  NM PET Image Initial (PI) Skull Base To Thigh    Standing Status:   Future    Standing Expiration Date:   11/06/2018    Order Specific Question:   ** REASON FOR EXAM (FREE TEXT)    Answer:   malignant neoplasm to right adrenal gland, unknown primary    Order Specific Question:   If indicated for the ordered procedure, I authorize the administration of a radiopharmaceutical per Radiology protocol    Answer:   Yes    Order Specific Question:   Preferred imaging location?    Answer:   [facility]    Order Specific Question:   Radiology Contrast Protocol - do NOT remove file path    Answer:   [email or web address]  . CBC with Differential ([place] Only)    Standing Status:   Standing    Number of Occurrences:   50    Standing Expiration Date:   11/07/2018  . CMP ([place] only)    Standing Status:   Standing    Number of Occurrences:   50    Standing Expiration Date:   11/07/2018  . CA 19.9    Standing Status:   Standing    Number of Occurrences:   20    Standing Expiration Date:   11/07/2018  . Ambulatory referral to Nutrition and Diabetic E    Referral Priority:   Urgent    Referral Type:   Consultation    Referral Reason:   Specialty Services Required    Number of Visits Requested:   1  . Ambulatory referral to Social Work    Referral Priority:   Urgent    Referral Type:   Consultation    Referral Reason:   Specialty Services Required    Number of Visits Requested:   1   All questions were answered. The patient knows to call the clinic with any problems, questions or concerns. No barriers to learning was detected.     Alla Feeling, NP 11/06/17   Addendum  I have seen the patient, examined him. I agree with the assessment and and plan and have edited the notes.   Mr Urenda came in for follow up after his hospital discharge. I underwent colonoscopy earlier this week, which was negative for malignancy.  I have discussed his adrenal  metastasis biopsy results with 2 pathologist, and his case was reviewed in the thoracic tumor board this morning, we felt this is most likely cholangiocarcinoma, with liver, lung, adrenal, and node metastasis, less likely lung primary. Metastatic carcinoma with occluded primary is also a possibility, will get a PET scan for further evaluation. His hyperbilirubinemia has improved, but not resolved, he will follow-up with IR today.  He is overall poor performance status, still high total bilirubin, I recommend him to start chemotherapy later next week, or the week of November 11.  I recommend systemic chemotherapy cisplatin and gemcitabine on day 1 and 8 every 21 days as a first-line therapy.  Benefits and potential side effects reviewed with patient in detail, he agrees to proceed.  The goal of therapy is palliative, to prolong his life, and improve his cancer related symptoms.  Patient understands his metastatic disease is incurable at this stage, and overall prognosis is very poor. Given his liver cirrhosis, and poor performance status, he will likely experience more side effects from chemotherapy.  We will reduce his dose for first cycle, and follow-up closely. Will arrange chemo class, nutritional consult, and SW consult.   Due to his persistent bloody biliary drainage, will hold on anticoagulation for his portal vein thrombosis.  [name]  11/06/2017

## 2017-11-06 NOTE — H&P (View-Only) (Signed)
Referring Physician(s):  Dr Pat Kocher  Chief Complaint: The patient is seen in follow up today Status post formal PTC and internal/external biliary drainage 10/26/17   History of present illness:  62 year old male with a history of cirrhosis, presumed [place], portal vein thrombosis, and jaundice with evidence of obstructed biliary system at the liver hilum.  10/23: Rt adrenal mass bx:  Soft Tissue Needle Core Biopsy, right adrenal mass - METASTATIC ADENOCARCINOMA,  Follow up today Pt says OP of drain has slowed to almost none in the last week OP in bag is bloody Flushing daily Denies N/V/ fever/chills  Past Medical History:  Diagnosis Date  . Abscess of anal and rectal regions    horse shoe abscess  . Alcoholic cirrhosis ([place])   . Arthritis    "everywhere; especially in my spine"  . Cirrhosis ([place])    STAGE 1 CIRRHOSIS PATIENT SEES DR NAMDIGAM FOR  . Colon cancer ([place]) 05/2005   TX SURGERY WITH LYMPH NODE REMOVAL  . Cough    LAST FEW WEEKS SAW DR RAMOS LUNGS MILKY SPUTUM OCC  . DM type 2 with diabetic peripheral neuropathy (HCC)    left foot  . Fatigue   . Fistula, anal    multiple  . History of substance abuse ([place])    last alcohol was 08/2011; last marijuania was 08/2011  . Hypertension   . PTSD (post-traumatic stress disorder)    "severe" HX OF  . Sleep deprivation     Past Surgical History:  Procedure Laterality Date  . ABSCESS DRAINAGE     "probably 15-20 so far at least; rectal"  . ANAL FISTULECTOMY  12/13/1998  . ANTERIOR FUSION CERVICAL SPINE  2010   "triple"; "did 2 surgeries on the same day in 7209 due to complications"  . CORONARY ANGIOPLASTY  2001  . detached muscle  2010   right chest; "after cervical fusion complications"  . ELBOW SURGERY  2007   "cut out part of a muscle"; left  . EXAMINATION UNDER ANESTHESIA  03/22/2005   fistula  . EXAMINATION UNDER ANESTHESIA  06/06/2011   Procedure: EXAM UNDER ANESTHESIA;  Surgeon: Stark Klein, MD;   Location: [place];  Service: General;  Laterality: N/A;  . HEMICOLECTOMY  06/06/2005   left  . INCISION AND DRAINAGE PERIRECTAL ABSCESS  03/30/2005  . INCISION AND DRAINAGE PERIRECTAL ABSCESS  12/13/2004  . INCISION AND DRAINAGE PERIRECTAL ABSCESS  07/14/2001  . INCISION AND DRAINAGE PERIRECTAL ABSCESS  08/12/2010   horseshoe abscess; Dr Redmond Pulling  . INCISION AND DRAINAGE PERIRECTAL ABSCESS  06/06/2011   Procedure: IRRIGATION AND DEBRIDEMENT PERIRECTAL ABSCESS;  Surgeon: Stark Klein, MD;  Location: [place];  Service: General;  Laterality: N/A;  . INCISION AND DRAINAGE PERIRECTAL ABSCESS N/A 02/24/2017   Procedure: IRRIGATION AND DEBRIDEMENT PERIRECTAL ABSCESS;  Surgeon: Ralene Ok, MD;  Location: [place];  Service: General;  Laterality: N/A;  . IR INT EXT BILIARY DRAIN WITH CHOLANGIOGRAM  10/26/2017  . IR RADIOLOGIST EVAL & MGMT  11/06/2017  . [place]  . PERCUTANEOUS PINNING PHALANX FRACTURE OF HAND  ~ 2008   "plates in 2 places"; right  . PLACEMENT OF SETON N/A 09/05/2017   Procedure: PLACEMENT OF SETON;  Surgeon: Leighton Ruff, MD;  Location[facility];  Service: General;  Laterality: N/A;  . RADIOLOGY WITH ANESTHESIA N/A 10/25/2017   Procedure: MRI WITH ANESTHESIA;  Surgeon: Radiologist, Medication, MD;  Location: [place];  Service: Radiology;  Laterality: N/A;  .  THORACIC DISCECTOMY  1990's    Allergies: Morphine and related and Other  Medications: Prior to Admission medications   Medication Sig Start Date End Date Taking? Authorizing Provider  aspirin EC 81 MG tablet Take 81 mg by mouth daily.    [provider]  atorvastatin (LIPITOR) 40 MG tablet Take 1 tablet (40 mg total) by mouth daily at 6 PM. Patient taking differently: Take 20 mg by mouth daily at 6 PM.  12/02/12   Viyuoh, Adeline C, MD  calcium carbonate (TUMS - DOSED IN MG ELEMENTAL CALCIUM) 500 MG chewable tablet Chew 1 tablet by mouth 2 (two) times daily as needed for indigestion or  heartburn.    [provider]  celecoxib (CELEBREX) 200 MG capsule Take 1 capsule by mouth daily. 10/10/17   [provider]  empagliflozin (JARDIANCE) 10 MG TABS tablet Take 10 mg by mouth at bedtime.     [provider]  Fexofenadine-Pseudoephedrine (ALLEGRA-D 24 HOUR PO) Take by mouth daily. WAL/FEX    [provider]  gabapentin (NEURONTIN) 400 MG capsule Take 400-800 mg by mouth 2 (two) times daily. 1 capsule am, 2 capsules pm    [provider]  glipiZIDE (GLUCOTROL) 5 MG tablet Take 5 mg by mouth 2 (two) times daily before a meal.    [provider]  Insulin Detemir (LEVEMIR FLEXPEN) 100 UNIT/ML Pen Inject 10-60 Units into the skin 2 (two) times daily. Take 62 units in the am and Take 10 units in the pm    [provider]  lisinopril (PRINIVIL,ZESTRIL) 20 MG tablet Take 20 mg by mouth daily.    [provider]  metFORMIN (GLUCOPHAGE) 1000 MG tablet Take 1,000 mg by mouth 2 (two) times daily with a meal.    [provider]  Multiple Vitamin (MULTIVITAMIN) tablet Take 1 tablet by mouth daily.    [provider]  oxyCODONE 10 MG TABS Take 1 tablet (10 mg total) by mouth every 4 (four) hours as needed for severe pain. 10/29/17   Nita Sells, MD  polyvinyl alcohol (LIQUIFILM TEARS) 1.4 % ophthalmic solution 1 drop as needed for dry eyes.    [provider]     Family History  Problem Relation Age of Onset  . Heart disease Father   . Heart disease Mother   . Cancer Mother   . Cancer Sister   . Heart disease Brother     Social History   Socioeconomic History  . Marital status: Married    Spouse name: Not on file  . Number of children: Not on file  . Years of education: Not on file  . Highest education level: Not on file  Occupational History  . Occupation: works at [employer]  . Financial resource strain: Not on file  . Food insecurity:    Worry: Not on  file    Inability: Not on file  . Transportation needs:    Medical: Not on file    Non-medical: Not on file  Tobacco Use  . Smoking status: Former Smoker    Packs/day: 1.00    Years: 10.00    Pack years: 10.00    Types: Cigarettes    Last attempt to quit: 01/08/1991    Years since quitting: 26.8  . Smokeless tobacco: Never Used  Substance and Sexual Activity  . Alcohol use: Yes    Comment: 06/05/11 "drank enough when I was younger to last me my whole life; don't remember when  I had my last drink, maybe 2012"  . Drug use: Not Currently    Types: Oxycodone, Marijuana    Comment: 06/05/11 "pot head in my younger days; 2009 dr had me on oxycodone and valium for almost 29yr  . Sexual activity: Not Currently  Lifestyle  . Physical activity:    Days per week: Not on file    Minutes per session: Not on file  . Stress: Not on file  Relationships  . Social connections:    Talks on phone: Not on file    Gets together: Not on file    Attends religious service: Not on file    Active member of club or organization: Not on file    Attends meetings of clubs or organizations: Not on file    Relationship status: Not on file  Other Topics Concern  . Not on file  Social History Narrative  . Not on file     Vital Signs: There were no vitals taken for this visit.  Physical Exam  Constitutional: He is oriented to person, place, and time.  Neurological: He is alert and oriented to person, place, and time.  Skin: Skin is warm and dry.  Site is clean and dry NT no bleeding Injection of 15 cc contrast shows drain is malpositioned Contrast is running out of drain onto skin site  Vitals reviewed.   Imaging: Ir Radiologist Eval & Mgmt  Result Date: 11/06/2017 Please refer to notes tab for details about interventional procedure. (Op Note)   Labs:  CBC: Recent Labs    10/27/17 0249 10/28/17 0221 10/29/17 0632 11/06/17 0904  WBC 7.6 8.1 6.8 11.8*  HGB 11.6* 10.6* 9.9* 10.1*  HCT  34.8* 31.6* 30.0* 31.0*  PLT 231 245 262 301    COAGS: Recent Labs    04/02/17 1346 10/23/17 1918 10/25/17 0551 10/26/17 0539  INR 1.2* 1.07 1.15 1.12    BMP: Recent Labs    10/27/17 0249 10/28/17 0221 10/29/17 0632 11/06/17 0904  NA 127* 127* 130* 131*  K 3.9 4.3 4.6 4.3  CL 95* 95* 95* 98  CO2 '24 26 26 23  ' GLUCOSE 121* 150* 144* 136*  BUN 8 7* 7* 10  CALCIUM 8.6* 8.4* 8.4* 9.0  CREATININE 0.39* 0.52* 0.47* 0.62  GFRNONAA >60 >60 >60 >60  GFRAA >60 >60 >60 >60    LIVER FUNCTION TESTS: Recent Labs    10/27/17 0249 10/28/17 0221 10/29/17 0632 11/06/17 0904  BILITOT 12.6* 9.3* 8.2* 4.5*  AST 134* 80* 79* 75*  ALT 166* 122* 94* 57*  ALKPHOS 721* 546* 439* 467*  PROT 7.4 7.2 7.2 7.5  ALBUMIN 2.3* 2.3* 2.3* 1.9*    Assessment:  Biliary internal/external drain placed 10/31/17 Drain injection and CT reveals malposition Dr SAnnamaria Bootsreviewed injection imaging and [facility]. [facility] prefers [facility] Scheduler will call pt with time and location asap. He is agreeable to plan.   Signed:Lavonia Drafts PA-C 11/06/2017, 1:46 PM   Please refer to Dr. SAnnamaria Bootsattestation of this note for management and plan.

## 2017-11-06 NOTE — Progress Notes (Signed)
Referring Physician(s):  Dr Pat Kocher  Chief Complaint: The patient is seen in follow up today Status post formal PTC and internal/external biliary drainage 10/26/17   History of present illness:  62 year old male with a history of cirrhosis, presumed [place], portal vein thrombosis, and jaundice with evidence of obstructed biliary system at the liver hilum.  10/23: Rt adrenal mass bx:  Soft Tissue Needle Core Biopsy, right adrenal mass - METASTATIC ADENOCARCINOMA,  Follow up today Pt says OP of drain has slowed to almost none in the last week OP in bag is bloody Flushing daily Denies N/V/ fever/chills  Past Medical History:  Diagnosis Date  . Abscess of anal and rectal regions    horse shoe abscess  . Alcoholic cirrhosis ([place])   . Arthritis    "everywhere; especially in my spine"  . Cirrhosis ([place])    STAGE 1 CIRRHOSIS PATIENT SEES DR NAMDIGAM FOR  . Colon cancer ([place]) 05/2005   TX SURGERY WITH LYMPH NODE REMOVAL  . Cough    LAST FEW WEEKS SAW DR RAMOS LUNGS MILKY SPUTUM OCC  . DM type 2 with diabetic peripheral neuropathy (HCC)    left foot  . Fatigue   . Fistula, anal    multiple  . History of substance abuse ([place])    last alcohol was 08/2011; last marijuania was 08/2011  . Hypertension   . PTSD (post-traumatic stress disorder)    "severe" HX OF  . Sleep deprivation     Past Surgical History:  Procedure Laterality Date  . ABSCESS DRAINAGE     "probably 15-20 so far at least; rectal"  . ANAL FISTULECTOMY  12/13/1998  . ANTERIOR FUSION CERVICAL SPINE  2010   "triple"; "did 2 surgeries on the same day in 8242 due to complications"  . CORONARY ANGIOPLASTY  2001  . detached muscle  2010   right chest; "after cervical fusion complications"  . ELBOW SURGERY  2007   "cut out part of a muscle"; left  . EXAMINATION UNDER ANESTHESIA  03/22/2005   fistula  . EXAMINATION UNDER ANESTHESIA  06/06/2011   Procedure: EXAM UNDER ANESTHESIA;  Surgeon: Stark Klein, MD;   Location: [place];  Service: General;  Laterality: N/A;  . HEMICOLECTOMY  06/06/2005   left  . INCISION AND DRAINAGE PERIRECTAL ABSCESS  03/30/2005  . INCISION AND DRAINAGE PERIRECTAL ABSCESS  12/13/2004  . INCISION AND DRAINAGE PERIRECTAL ABSCESS  07/14/2001  . INCISION AND DRAINAGE PERIRECTAL ABSCESS  08/12/2010   horseshoe abscess; Dr Redmond Pulling  . INCISION AND DRAINAGE PERIRECTAL ABSCESS  06/06/2011   Procedure: IRRIGATION AND DEBRIDEMENT PERIRECTAL ABSCESS;  Surgeon: Stark Klein, MD;  Location: [place];  Service: General;  Laterality: N/A;  . INCISION AND DRAINAGE PERIRECTAL ABSCESS N/A 02/24/2017   Procedure: IRRIGATION AND DEBRIDEMENT PERIRECTAL ABSCESS;  Surgeon: Ralene Ok, MD;  Location: [place];  Service: General;  Laterality: N/A;  . IR INT EXT BILIARY DRAIN WITH CHOLANGIOGRAM  10/26/2017  . IR RADIOLOGIST EVAL & MGMT  11/06/2017  . [place]  . PERCUTANEOUS PINNING PHALANX FRACTURE OF HAND  ~ 2008   "plates in 2 places"; right  . PLACEMENT OF SETON N/A 09/05/2017   Procedure: PLACEMENT OF SETON;  Surgeon: Leighton Ruff, MD;  Location: [facility];  Service: General;  Laterality: N/A;  . RADIOLOGY WITH ANESTHESIA N/A 10/25/2017   Procedure: MRI WITH ANESTHESIA;  Surgeon: Radiologist, Medication, MD;  Location: [place];  Service: Radiology;  Laterality: N/A;  .  THORACIC DISCECTOMY  1990's    Allergies: Morphine and related and Other  Medications: Prior to Admission medications   Medication Sig Start Date End Date Taking? Authorizing Provider  aspirin EC 81 MG tablet Take 81 mg by mouth daily.    [provider]  atorvastatin (LIPITOR) 40 MG tablet Take 1 tablet (40 mg total) by mouth daily at 6 PM. Patient taking differently: Take 20 mg by mouth daily at 6 PM.  12/02/12   Viyuoh, Adeline C, MD  calcium carbonate (TUMS - DOSED IN MG ELEMENTAL CALCIUM) 500 MG chewable tablet Chew 1 tablet by mouth 2 (two) times daily as needed for indigestion or  heartburn.    [provider]  celecoxib (CELEBREX) 200 MG capsule Take 1 capsule by mouth daily. 10/10/17   [provider]  empagliflozin (JARDIANCE) 10 MG TABS tablet Take 10 mg by mouth at bedtime.     [provider]  Fexofenadine-Pseudoephedrine (ALLEGRA-D 24 HOUR PO) Take by mouth daily. WAL/FEX    [provider]  gabapentin (NEURONTIN) 400 MG capsule Take 400-800 mg by mouth 2 (two) times daily. 1 capsule am, 2 capsules pm    [provider]  glipiZIDE (GLUCOTROL) 5 MG tablet Take 5 mg by mouth 2 (two) times daily before a meal.    [provider]  Insulin Detemir (LEVEMIR FLEXPEN) 100 UNIT/ML Pen Inject 10-60 Units into the skin 2 (two) times daily. Take 62 units in the am and Take 10 units in the pm    [provider]  lisinopril (PRINIVIL,ZESTRIL) 20 MG tablet Take 20 mg by mouth daily.    [provider]  metFORMIN (GLUCOPHAGE) 1000 MG tablet Take 1,000 mg by mouth 2 (two) times daily with a meal.    [provider]  Multiple Vitamin (MULTIVITAMIN) tablet Take 1 tablet by mouth daily.    [provider]  oxyCODONE 10 MG TABS Take 1 tablet (10 mg total) by mouth every 4 (four) hours as needed for severe pain. 10/29/17   Nita Sells, MD  polyvinyl alcohol (LIQUIFILM TEARS) 1.4 % ophthalmic solution 1 drop as needed for dry eyes.    [provider]     Family History  Problem Relation Age of Onset  . Heart disease Father   . Heart disease Mother   . Cancer Mother   . Cancer Sister   . Heart disease Brother     Social History   Socioeconomic History  . Marital status: Married    Spouse name: Not on file  . Number of children: Not on file  . Years of education: Not on file  . Highest education level: Not on file  Occupational History  . Occupation: works at [employer]  . Financial resource strain: Not on file  . Food insecurity:    Worry: Not on  file    Inability: Not on file  . Transportation needs:    Medical: Not on file    Non-medical: Not on file  Tobacco Use  . Smoking status: Former Smoker    Packs/day: 1.00    Years: 10.00    Pack years: 10.00    Types: Cigarettes    Last attempt to quit: 01/08/1991    Years since quitting: 26.8  . Smokeless tobacco: Never Used  Substance and Sexual Activity  . Alcohol use: Yes    Comment: 06/05/11 "drank enough when I was younger to last me my whole life; don't remember when  I had my last drink, maybe 2012"  . Drug use: Not Currently    Types: Oxycodone, Marijuana    Comment: 06/05/11 "pot head in my younger days; 2009 dr had me on oxycodone and valium for almost 55yr  . Sexual activity: Not Currently  Lifestyle  . Physical activity:    Days per week: Not on file    Minutes per session: Not on file  . Stress: Not on file  Relationships  . Social connections:    Talks on phone: Not on file    Gets together: Not on file    Attends religious service: Not on file    Active member of club or organization: Not on file    Attends meetings of clubs or organizations: Not on file    Relationship status: Not on file  Other Topics Concern  . Not on file  Social History Narrative  . Not on file     Vital Signs: There were no vitals taken for this visit.  Physical Exam  Constitutional: He is oriented to person, place, and time.  Neurological: He is alert and oriented to person, place, and time.  Skin: Skin is warm and dry.  Site is clean and dry NT no bleeding Injection of 15 cc contrast shows drain is malpositioned Contrast is running out of drain onto skin site  Vitals reviewed.   Imaging: Ir Radiologist Eval & Mgmt  Result Date: 11/06/2017 Please refer to notes tab for details about interventional procedure. (Op Note)   Labs:  CBC: Recent Labs    10/27/17 0249 10/28/17 0221 10/29/17 0632 11/06/17 0904  WBC 7.6 8.1 6.8 11.8*  HGB 11.6* 10.6* 9.9* 10.1*  HCT  34.8* 31.6* 30.0* 31.0*  PLT 231 245 262 301    COAGS: Recent Labs    04/02/17 1346 10/23/17 1918 10/25/17 0551 10/26/17 0539  INR 1.2* 1.07 1.15 1.12    BMP: Recent Labs    10/27/17 0249 10/28/17 0221 10/29/17 0632 11/06/17 0904  NA 127* 127* 130* 131*  K 3.9 4.3 4.6 4.3  CL 95* 95* 95* 98  CO2 '24 26 26 23  ' GLUCOSE 121* 150* 144* 136*  BUN 8 7* 7* 10  CALCIUM 8.6* 8.4* 8.4* 9.0  CREATININE 0.39* 0.52* 0.47* 0.62  GFRNONAA >60 >60 >60 >60  GFRAA >60 >60 >60 >60    LIVER FUNCTION TESTS: Recent Labs    10/27/17 0249 10/28/17 0221 10/29/17 0632 11/06/17 0904  BILITOT 12.6* 9.3* 8.2* 4.5*  AST 134* 80* 79* 75*  ALT 166* 122* 94* 57*  ALKPHOS 721* 546* 439* 467*  PROT 7.4 7.2 7.2 7.5  ALBUMIN 2.3* 2.3* 2.3* 1.9*    Assessment:  Biliary internal/external drain placed 10/31/17 Drain injection and CT reveals malposition Dr SAnnamaria Bootsreviewed injection imaging and [facility]. [facility] Scheduler will call pt with time and location asap. He is agreeable to plan.   Signed:Lavonia Drafts PA-C 11/06/2017, 1:46 PM   Please refer to Dr. SAnnamaria Bootsattestation of this note for management and plan.

## 2017-11-06 NOTE — Patient Instructions (Signed)
Cisplatin injection What is this medicine? CISPLATIN (SIS pla tin) is a chemotherapy drug. It targets fast dividing cells, like cancer cells, and causes these cells to die. This medicine is used to treat many types of cancer like bladder, ovarian, and testicular cancers. This medicine may be used for other purposes; ask your health care provider or pharmacist if you have questions. COMMON BRAND NAME(S): Platinol, Platinol -AQ What should I tell my health care provider before I take this medicine? They need to know if you have any of these conditions: -blood disorders -hearing problems -kidney disease -recent or ongoing radiation therapy -an unusual or allergic reaction to cisplatin, carboplatin, other chemotherapy, other medicines, foods, dyes, or preservatives -pregnant or trying to get pregnant -breast-feeding How should I use this medicine? This drug is given as an infusion into a vein. It is administered in a hospital or clinic by a specially trained health care professional. Talk to your pediatrician regarding the use of this medicine in children. Special care may be needed. Overdosage: If you think you have taken too much of this medicine contact a poison control center or emergency room at once. NOTE: This medicine is only for you. Do not share this medicine with others. What if I miss a dose? It is important not to miss a dose. Call your doctor or health care professional if you are unable to keep an appointment. What may interact with this medicine? -dofetilide -foscarnet -medicines for seizures -medicines to increase blood counts like filgrastim, pegfilgrastim, sargramostim -probenecid -pyridoxine used with altretamine -rituximab -some antibiotics like amikacin, gentamicin, neomycin, polymyxin B, streptomycin, tobramycin -sulfinpyrazone -vaccines -zalcitabine Talk to your doctor or health care professional before taking any of these  medicines: -acetaminophen -aspirin -ibuprofen -ketoprofen -naproxen This list may not describe all possible interactions. Give your health care provider a list of all the medicines, herbs, non-prescription drugs, or dietary supplements you use. Also tell them if you smoke, drink alcohol, or use illegal drugs. Some items may interact with your medicine. What should I watch for while using this medicine? Your condition will be monitored carefully while you are receiving this medicine. You will need important blood work done while you are taking this medicine. This drug may make you feel generally unwell. This is not uncommon, as chemotherapy can affect healthy cells as well as cancer cells. Report any side effects. Continue your course of treatment even though you feel ill unless your doctor tells you to stop. In some cases, you may be given additional medicines to help with side effects. Follow all directions for their use. Call your doctor or health care professional for advice if you get a fever, chills or sore throat, or other symptoms of a cold or flu. Do not treat yourself. This drug decreases your body's ability to fight infections. Try to avoid being around people who are sick. This medicine may increase your risk to bruise or bleed. Call your doctor or health care professional if you notice any unusual bleeding. Be careful brushing and flossing your teeth or using a toothpick because you may get an infection or bleed more easily. If you have any dental work done, tell your dentist you are receiving this medicine. Avoid taking products that contain aspirin, acetaminophen, ibuprofen, naproxen, or ketoprofen unless instructed by your doctor. These medicines may hide a fever. Do not become pregnant while taking this medicine. Women should inform their doctor if they wish to become pregnant or think they might be pregnant. There is a  potential for serious side effects to an unborn child. Talk to  your health care professional or pharmacist for more information. Do not breast-feed an infant while taking this medicine. Drink fluids as directed while you are taking this medicine. This will help protect your kidneys. Call your doctor or health care professional if you get diarrhea. Do not treat yourself. What side effects may I notice from receiving this medicine? Side effects that you should report to your doctor or health care professional as soon as possible: -allergic reactions like skin rash, itching or hives, swelling of the face, lips, or tongue -signs of infection - fever or chills, cough, sore throat, pain or difficulty passing urine -signs of decreased platelets or bleeding - bruising, pinpoint red spots on the skin, black, tarry stools, nosebleeds -signs of decreased red blood cells - unusually weak or tired, fainting spells, lightheadedness -breathing problems -changes in hearing -gout pain -low blood counts - This drug may decrease the number of white blood cells, red blood cells and platelets. You may be at increased risk for infections and bleeding. -nausea and vomiting -pain, swelling, redness or irritation at the injection site -pain, tingling, numbness in the hands or feet -problems with balance, movement -trouble passing urine or change in the amount of urine Side effects that usually do not require medical attention (report to your doctor or health care professional if they continue or are bothersome): -changes in vision -loss of appetite -metallic taste in the mouth or changes in taste This list may not describe all possible side effects. Call your doctor for medical advice about side effects. You may report side effects to FDA at 1-800-FDA-1088. Where should I keep my medicine? This drug is given in a hospital or clinic and will not be stored at home. NOTE: This sheet is a summary. It may not cover all possible information. If you have questions about this medicine,  talk to your doctor, pharmacist, or health care provider.  2018 Elsevier/Gold Standard (2007-03-31 14:40:54) Gemcitabine injection What is this medicine? GEMCITABINE (jem SIT a been) is a chemotherapy drug. This medicine is used to treat many types of cancer like breast cancer, lung cancer, pancreatic cancer, and ovarian cancer. This medicine may be used for other purposes; ask your health care provider or pharmacist if you have questions. COMMON BRAND NAME(S): Gemzar What should I tell my health care provider before I take this medicine? They need to know if you have any of these conditions: -blood disorders -infection -kidney disease -liver disease -recent or ongoing radiation therapy -an unusual or allergic reaction to gemcitabine, other chemotherapy, other medicines, foods, dyes, or preservatives -pregnant or trying to get pregnant -breast-feeding How should I use this medicine? This drug is given as an infusion into a vein. It is administered in a hospital or clinic by a specially trained health care professional. Talk to your pediatrician regarding the use of this medicine in children. Special care may be needed. Overdosage: If you think you have taken too much of this medicine contact a poison control center or emergency room at once. NOTE: This medicine is only for you. Do not share this medicine with others. What if I miss a dose? It is important not to miss your dose. Call your doctor or health care professional if you are unable to keep an appointment. What may interact with this medicine? -medicines to increase blood counts like filgrastim, pegfilgrastim, sargramostim -some other chemotherapy drugs like cisplatin -vaccines Talk to your doctor or health  care professional before taking any of these medicines: -acetaminophen -aspirin -ibuprofen -ketoprofen -naproxen This list may not describe all possible interactions. Give your health care provider a list of all the  medicines, herbs, non-prescription drugs, or dietary supplements you use. Also tell them if you smoke, drink alcohol, or use illegal drugs. Some items may interact with your medicine. What should I watch for while using this medicine? Visit your doctor for checks on your progress. This drug may make you feel generally unwell. This is not uncommon, as chemotherapy can affect healthy cells as well as cancer cells. Report any side effects. Continue your course of treatment even though you feel ill unless your doctor tells you to stop. In some cases, you may be given additional medicines to help with side effects. Follow all directions for their use. Call your doctor or health care professional for advice if you get a fever, chills or sore throat, or other symptoms of a cold or flu. Do not treat yourself. This drug decreases your body's ability to fight infections. Try to avoid being around people who are sick. This medicine may increase your risk to bruise or bleed. Call your doctor or health care professional if you notice any unusual bleeding. Be careful brushing and flossing your teeth or using a toothpick because you may get an infection or bleed more easily. If you have any dental work done, tell your dentist you are receiving this medicine. Avoid taking products that contain aspirin, acetaminophen, ibuprofen, naproxen, or ketoprofen unless instructed by your doctor. These medicines may hide a fever. Women should inform their doctor if they wish to become pregnant or think they might be pregnant. There is a potential for serious side effects to an unborn child. Talk to your health care professional or pharmacist for more information. Do not breast-feed an infant while taking this medicine. What side effects may I notice from receiving this medicine? Side effects that you should report to your doctor or health care professional as soon as possible: -allergic reactions like skin rash, itching or hives,  swelling of the face, lips, or tongue -low blood counts - this medicine may decrease the number of white blood cells, red blood cells and platelets. You may be at increased risk for infections and bleeding. -signs of infection - fever or chills, cough, sore throat, pain or difficulty passing urine -signs of decreased platelets or bleeding - bruising, pinpoint red spots on the skin, black, tarry stools, blood in the urine -signs of decreased red blood cells - unusually weak or tired, fainting spells, lightheadedness -breathing problems -chest pain -mouth sores -nausea and vomiting -pain, swelling, redness at site where injected -pain, tingling, numbness in the hands or feet -stomach pain -swelling of ankles, feet, hands -unusual bleeding Side effects that usually do not require medical attention (report to your doctor or health care professional if they continue or are bothersome): -constipation -diarrhea -hair loss -loss of appetite -stomach upset This list may not describe all possible side effects. Call your doctor for medical advice about side effects. You may report side effects to FDA at 1-800-FDA-1088. Where should I keep my medicine? This drug is given in a hospital or clinic and will not be stored at home. NOTE: This sheet is a summary. It may not cover all possible information. If you have questions about this medicine, talk to your doctor, pharmacist, or health care provider.  2018 Elsevier/Gold Standard (2007-05-05 18:45:54)

## 2017-11-06 NOTE — Progress Notes (Signed)
START OFF PATHWAY REGIMEN - [Other Dx]   [id number]:Cisplatin 25 mg/m2 D1,8 + Gemcitabine 1,000 mg/m2 D1,8 q21 Days:   A cycle is every 21 days:     Gemcitabine      Cisplatin   **Always confirm dose/schedule in your pharmacy ordering system**  Patient Characteristics: Intent of Therapy: Non-Curative / Palliative Intent, Discussed with Patient

## 2017-11-06 NOTE — Progress Notes (Signed)
  Oncology Nurse Navigator Documentation    Met with Sudie Grumbling and son, Thedore Mins. Explained role of nurse navigator. Educational information provided on cholangiocarcinoma from [employer].  Contact names and phone numbers were provided for entire [facility] team.  Teach back method was used.  No barriers to care identified at present time.  Will continue to follow as needed. Patient has been referred to social work and dietician. No barriers to care identified at this time.

## 2017-11-07 ENCOUNTER — Other Ambulatory Visit (HOSPITAL_COMMUNITY): Payer: Self-pay | Admitting: Interventional Radiology

## 2017-11-07 ENCOUNTER — Telehealth: Payer: Self-pay | Admitting: Emergency Medicine

## 2017-11-07 ENCOUNTER — Other Ambulatory Visit: Payer: Self-pay | Admitting: Radiology

## 2017-11-07 ENCOUNTER — Ambulatory Visit (HOSPITAL_COMMUNITY)
Admission: RE | Admit: 2017-11-07 | Discharge: 2017-11-07 | Disposition: A | Discharge status: Home / Self Care | Payer: Medicare Other | Source: Ambulatory Visit | Attending: Interventional Radiology | Admitting: Interventional Radiology

## 2017-11-07 ENCOUNTER — Other Ambulatory Visit: Payer: Self-pay | Admitting: Nurse Practitioner

## 2017-11-07 DIAGNOSIS — Z794 Long term (current) use of insulin: Secondary | ICD-10-CM

## 2017-11-07 DIAGNOSIS — E1142 Type 2 diabetes mellitus with diabetic polyneuropathy: Secondary | ICD-10-CM

## 2017-11-07 DIAGNOSIS — F4310 Post-traumatic stress disorder, unspecified: Secondary | ICD-10-CM | POA: Insufficient documentation

## 2017-11-07 DIAGNOSIS — K831 Obstruction of bile duct: Secondary | ICD-10-CM

## 2017-11-07 DIAGNOSIS — Z87891 Personal history of nicotine dependence: Secondary | ICD-10-CM | POA: Insufficient documentation

## 2017-11-07 DIAGNOSIS — K7030 Alcoholic cirrhosis of liver without ascites: Secondary | ICD-10-CM

## 2017-11-07 DIAGNOSIS — I10 Essential (primary) hypertension: Secondary | ICD-10-CM | POA: Insufficient documentation

## 2017-11-07 DIAGNOSIS — Z8659 Personal history of other mental and behavioral disorders: Secondary | ICD-10-CM

## 2017-11-07 DIAGNOSIS — M1990 Unspecified osteoarthritis, unspecified site: Secondary | ICD-10-CM | POA: Insufficient documentation

## 2017-11-07 DIAGNOSIS — Z7982 Long term (current) use of aspirin: Secondary | ICD-10-CM | POA: Insufficient documentation

## 2017-11-07 DIAGNOSIS — T85520A Displacement of bile duct prosthesis, initial encounter: Secondary | ICD-10-CM | POA: Diagnosis not present

## 2017-11-07 HISTORY — PX: IR EXCHANGE BILIARY DRAIN: IMG6046

## 2017-11-07 LAB — GLUCOSE, CAPILLARY
Glucose-Capillary: 63 mg/dL — ABNORMAL LOW (ref 70–99)
Glucose-Capillary: 74 mg/dL (ref 70–99)

## 2017-11-07 MED ORDER — SODIUM CHLORIDE 0.9% FLUSH: 5.0000 mL | Freq: Three times a day (TID) | INTRAVENOUS | Status: DC

## 2017-11-07 MED ORDER — IOPAMIDOL (ISOVUE-300) INJECTION 61%
INTRAVENOUS | Status: AC
  Administered 2017-11-07: 15 mL
  Filled 2017-11-07: qty 50

## 2017-11-07 MED ORDER — LIDOCAINE HCL 1 % IJ SOLN
INTRAMUSCULAR | Status: AC
  Filled 2017-11-07: qty 20

## 2017-11-07 MED ORDER — LIDOCAINE HCL 1 % IJ SOLN
INTRAMUSCULAR | Status: AC | PRN
  Administered 2017-11-07: 5 mL

## 2017-11-07 MED ORDER — MIDAZOLAM HCL 2 MG/2ML IJ SOLN
INTRAMUSCULAR | Status: AC
  Filled 2017-11-07: qty 2

## 2017-11-07 MED ORDER — FENTANYL CITRATE (PF) 100 MCG/2ML IJ SOLN
INTRAMUSCULAR | Status: AC | PRN
  Administered 2017-11-07: 25 ug via INTRAVENOUS

## 2017-11-07 MED ORDER — MIDAZOLAM HCL 2 MG/2ML IJ SOLN
INTRAMUSCULAR | Status: AC | PRN
  Administered 2017-11-07: 1 mg via INTRAVENOUS

## 2017-11-07 MED ORDER — ALPRAZOLAM 0.5 MG PO TABS: ORAL_TABLET | ORAL | 0 refills | Status: DC

## 2017-11-07 MED ORDER — FENTANYL CITRATE (PF) 100 MCG/2ML IJ SOLN
INTRAMUSCULAR | Status: AC
  Filled 2017-11-07: qty 2

## 2017-11-07 NOTE — Interval H&P Note (Signed)
History and Physical Interval Note:  11/07/2017 2:38 PM  Matthew Lewis  has presented today for surgery, with the diagnosis of dislodged biliary drain. The various methods of treatment have been discussed with the patient and family. After consideration of risks, benefits and other options for treatment, the patient has consented to biliary drain exchange as a surgical intervention .  The patient's history has been reviewed, patient examined, no change in status, stable for surgery.  I have reviewed the patient's chart and labs.  Questions were answered to the patient's satisfaction.     Ascencion Dike

## 2017-11-07 NOTE — Telephone Encounter (Signed)
Patient requesting medication for his PET scan to help with his claustrophobia. rx send to his pharmacy for xanax one time dose. VM left for patient to call back so this can be explained to him.   Matthew Lewis 11/07/2017, 2:30 PM

## 2017-11-07 NOTE — Procedures (Signed)
Interventional Radiology Procedure Note  Procedure: Exchange of PTC int/ext drain, 2nd to displaced drain.  New 62F drain.  Complications: None Recommendations:  - to gravity - Do not submerge - Routine drain care   Signed,  Dulcy Fanny. Earleen Newport, DO

## 2017-11-07 NOTE — Progress Notes (Signed)
x

## 2017-11-07 NOTE — Discharge Instructions (Signed)
Biliary Drainage Catheter Placement, Care After °This sheet gives you information about how to care for yourself after your procedure. Your health care provider may also give you more specific instructions. If you have problems or questions, contact your health care provider. °What can I expect after the procedure? °After the procedure, it is common to have: °· Pain or soreness at the catheter insertion site. °· Tiredness and sleepiness for several hours. °· Some bruising at the catheter insertion site. °· Drainage into the collection bag on the outside of your body, if you have an external drainage catheter. °? You might see bloody discharge in the bag for the first 1 or 2 days. °? Then, the discharge should turn a yellow-green color. ° °Follow these instructions at home: °Medicines °· Take over-the-counter and prescription medicines for pain, discomfort, or fever only as told by your health care provider. °· Do not take aspirin or blood thinners unless your health care provider says that you can. These can make bleeding worse. °· Do not drive or use heavy machinery while taking prescription pain medicine. °Catheter insertion site care °· Clean the catheter insertion site as told by your health care provider. °· Do not take baths, swim, or use a hot tub until your health care provider approves. °· Take showers only. Before showering, cover the catheter insertion area with a watertight covering to keep the area dry. °· Keep the skin around the catheter insertion site dry. If the area gets wet, dry the skin completely. °· Check your catheter insertion site every day for signs of infection. Check for: °? Redness, swelling, or pain. °? Fluid or blood. °? Warmth. °? Pus or a bad smell. °General instructions °· Rest for the remainder of the day. °· Do not drive, use machinery, or make legal decisions for 24 hours after your procedure. °· Resume your usual diet. Avoid alcoholic beverages for 24 hours after your  procedure. °· Keep all follow-up visits as told by your health care provider. This is important. °· Drink enough fluid to keep your urine clear or pale yellow. °Contact a health care provider if: °· Your pain gets worse after it had improved, and it is not relieved with pain medicines. °· You have any questions about caring for your drainage catheter or collection bag. °· You have any of these around your catheter insertion site or coming from it: °? Skin breakdown. °? Redness, swelling, or pain. °? Fluid or blood. °? Warmth to the touch. °? Pus or a bad smell. °Get help right away if: °· You have a fever or chills. °· Your redness, swelling, or pain at the catheter insertion site gets worse, even though you are cleaning it well. °· You have leakage of bile around the drainage catheter. °· Your drainage catheter becomes blocked or clogged. °· Your drainage catheter comes out. °This information is not intended to replace advice given to you by your health care provider. Make sure you discuss any questions you have with your health care provider. °Document Released: 08/08/2003 Document Revised: 11/13/2015 Document Reviewed: 11/13/2015 °Elsevier Interactive Patient Education © 2017 [employer]vier Inc. ° °

## 2017-11-08 ENCOUNTER — Emergency Department (HOSPITAL_COMMUNITY): Payer: Medicare Other

## 2017-11-08 ENCOUNTER — Inpatient Hospital Stay (HOSPITAL_COMMUNITY)
Admission: EM | Admit: 2017-11-08 | Discharge: 2017-11-11 | DRG: 871 | Disposition: A | Discharge status: Home Health | Payer: Medicare Other | Attending: Family Medicine | Admitting: Family Medicine

## 2017-11-08 ENCOUNTER — Encounter (HOSPITAL_COMMUNITY): Payer: Self-pay | Admitting: Interventional Radiology

## 2017-11-08 DIAGNOSIS — A419 Sepsis, unspecified organism: Secondary | ICD-10-CM | POA: Diagnosis not present

## 2017-11-08 DIAGNOSIS — C7972 Secondary malignant neoplasm of left adrenal gland: Secondary | ICD-10-CM | POA: Diagnosis present

## 2017-11-08 DIAGNOSIS — K5010 Crohn's disease of large intestine without complications: Secondary | ICD-10-CM | POA: Diagnosis not present

## 2017-11-08 DIAGNOSIS — Z981 Arthrodesis status: Secondary | ICD-10-CM

## 2017-11-08 DIAGNOSIS — K831 Obstruction of bile duct: Secondary | ICD-10-CM | POA: Diagnosis present

## 2017-11-08 DIAGNOSIS — Z794 Long term (current) use of insulin: Secondary | ICD-10-CM | POA: Diagnosis not present

## 2017-11-08 DIAGNOSIS — B961 Klebsiella pneumoniae [K. pneumoniae] as the cause of diseases classified elsewhere: Secondary | ICD-10-CM | POA: Diagnosis not present

## 2017-11-08 DIAGNOSIS — E440 Moderate protein-calorie malnutrition: Secondary | ICD-10-CM | POA: Diagnosis not present

## 2017-11-08 DIAGNOSIS — Z955 Presence of coronary angioplasty implant and graft: Secondary | ICD-10-CM | POA: Diagnosis not present

## 2017-11-08 DIAGNOSIS — C7971 Secondary malignant neoplasm of right adrenal gland: Secondary | ICD-10-CM | POA: Diagnosis present

## 2017-11-08 DIAGNOSIS — Z66 Do not resuscitate: Secondary | ICD-10-CM | POA: Diagnosis present

## 2017-11-08 DIAGNOSIS — A4159 Other Gram-negative sepsis: Principal | ICD-10-CM | POA: Diagnosis present

## 2017-11-08 DIAGNOSIS — R531 Weakness: Secondary | ICD-10-CM | POA: Diagnosis not present

## 2017-11-08 DIAGNOSIS — C787 Secondary malignant neoplasm of liver and intrahepatic bile duct: Secondary | ICD-10-CM | POA: Diagnosis present

## 2017-11-08 DIAGNOSIS — C7800 Secondary malignant neoplasm of unspecified lung: Secondary | ICD-10-CM | POA: Diagnosis present

## 2017-11-08 DIAGNOSIS — Z9049 Acquired absence of other specified parts of digestive tract: Secondary | ICD-10-CM | POA: Diagnosis not present

## 2017-11-08 DIAGNOSIS — Z515 Encounter for palliative care: Secondary | ICD-10-CM | POA: Diagnosis present

## 2017-11-08 DIAGNOSIS — I10 Essential (primary) hypertension: Secondary | ICD-10-CM | POA: Diagnosis present

## 2017-11-08 DIAGNOSIS — R748 Abnormal levels of other serum enzymes: Secondary | ICD-10-CM | POA: Diagnosis present

## 2017-11-08 DIAGNOSIS — E1142 Type 2 diabetes mellitus with diabetic polyneuropathy: Secondary | ICD-10-CM | POA: Diagnosis present

## 2017-11-08 DIAGNOSIS — I959 Hypotension, unspecified: Secondary | ICD-10-CM | POA: Diagnosis not present

## 2017-11-08 DIAGNOSIS — E871 Hypo-osmolality and hyponatremia: Secondary | ICD-10-CM | POA: Diagnosis not present

## 2017-11-08 DIAGNOSIS — Z85038 Personal history of other malignant neoplasm of large intestine: Secondary | ICD-10-CM

## 2017-11-08 DIAGNOSIS — K7030 Alcoholic cirrhosis of liver without ascites: Secondary | ICD-10-CM | POA: Diagnosis present

## 2017-11-08 DIAGNOSIS — Z85118 Personal history of other malignant neoplasm of bronchus and lung: Secondary | ICD-10-CM | POA: Diagnosis not present

## 2017-11-08 DIAGNOSIS — R000 Tachycardia, unspecified: Secondary | ICD-10-CM | POA: Diagnosis not present

## 2017-11-08 DIAGNOSIS — E119 Type 2 diabetes mellitus without complications: Secondary | ICD-10-CM | POA: Diagnosis not present

## 2017-11-08 DIAGNOSIS — E785 Hyperlipidemia, unspecified: Secondary | ICD-10-CM | POA: Diagnosis present

## 2017-11-08 DIAGNOSIS — D630 Anemia in neoplastic disease: Secondary | ICD-10-CM | POA: Diagnosis present

## 2017-11-08 DIAGNOSIS — Z885 Allergy status to narcotic agent status: Secondary | ICD-10-CM

## 2017-11-08 DIAGNOSIS — C221 Intrahepatic bile duct carcinoma: Secondary | ICD-10-CM | POA: Diagnosis not present

## 2017-11-08 DIAGNOSIS — Z6829 Body mass index (BMI) 29.0-29.9, adult: Secondary | ICD-10-CM | POA: Diagnosis not present

## 2017-11-08 DIAGNOSIS — Z7982 Long term (current) use of aspirin: Secondary | ICD-10-CM

## 2017-11-08 DIAGNOSIS — R109 Unspecified abdominal pain: Secondary | ICD-10-CM | POA: Diagnosis not present

## 2017-11-08 DIAGNOSIS — Z7189 Other specified counseling: Secondary | ICD-10-CM | POA: Diagnosis not present

## 2017-11-08 DIAGNOSIS — K529 Noninfective gastroenteritis and colitis, unspecified: Secondary | ICD-10-CM | POA: Diagnosis present

## 2017-11-08 DIAGNOSIS — Z79899 Other long term (current) drug therapy: Secondary | ICD-10-CM

## 2017-11-08 DIAGNOSIS — C772 Secondary and unspecified malignant neoplasm of intra-abdominal lymph nodes: Secondary | ICD-10-CM | POA: Diagnosis present

## 2017-11-08 DIAGNOSIS — R7881 Bacteremia: Secondary | ICD-10-CM | POA: Diagnosis not present

## 2017-11-08 DIAGNOSIS — I81 Portal vein thrombosis: Secondary | ICD-10-CM | POA: Diagnosis not present

## 2017-11-08 DIAGNOSIS — K7460 Unspecified cirrhosis of liver: Secondary | ICD-10-CM | POA: Diagnosis present

## 2017-11-08 DIAGNOSIS — Z791 Long term (current) use of non-steroidal anti-inflammatories (NSAID): Secondary | ICD-10-CM

## 2017-11-08 DIAGNOSIS — I2510 Atherosclerotic heart disease of native coronary artery without angina pectoris: Secondary | ICD-10-CM | POA: Diagnosis present

## 2017-11-08 DIAGNOSIS — C801 Malignant (primary) neoplasm, unspecified: Secondary | ICD-10-CM | POA: Diagnosis present

## 2017-11-08 DIAGNOSIS — Z87891 Personal history of nicotine dependence: Secondary | ICD-10-CM

## 2017-11-08 DIAGNOSIS — R0602 Shortness of breath: Secondary | ICD-10-CM | POA: Diagnosis not present

## 2017-11-08 LAB — CBC WITH DIFFERENTIAL/PLATELET
Abs Immature Granulocytes: 0.1 10*3/uL — ABNORMAL HIGH (ref 0.00–0.07)
Basophils Absolute: 0 10*3/uL (ref 0.0–0.1)
Basophils Relative: 0 %
EOS PCT: 0 %
Eosinophils Absolute: 0 10*3/uL (ref 0.0–0.5)
HEMATOCRIT: 28.7 % — AB (ref 39.0–52.0)
HEMOGLOBIN: 9.3 g/dL — AB (ref 13.0–17.0)
Immature Granulocytes: 1 %
LYMPHS ABS: 0.7 10*3/uL (ref 0.7–4.0)
LYMPHS PCT: 6 %
MCH: 30.3 pg (ref 26.0–34.0)
MCHC: 32.4 g/dL (ref 30.0–36.0)
MCV: 93.5 fL (ref 80.0–100.0)
MONO ABS: 0.9 10*3/uL (ref 0.1–1.0)
Monocytes Relative: 8 %
NRBC: 0.2 % (ref 0.0–0.2)
Neutro Abs: 9.6 10*3/uL — ABNORMAL HIGH (ref 1.7–7.7)
Neutrophils Relative %: 85 %
Platelets: 351 10*3/uL (ref 150–400)
RBC: 3.07 MIL/uL — ABNORMAL LOW (ref 4.22–5.81)
RDW: 17.5 % — AB (ref 11.5–15.5)
WBC: 11.3 10*3/uL — ABNORMAL HIGH (ref 4.0–10.5)

## 2017-11-08 LAB — PROTIME-INR
INR: 1.18
Prothrombin Time: 14.9 seconds (ref 11.4–15.2)

## 2017-11-08 LAB — COMPREHENSIVE METABOLIC PANEL
ALT: 64 U/L — AB (ref 0–44)
AST: 77 U/L — ABNORMAL HIGH (ref 15–41)
Albumin: 2.2 g/dL — ABNORMAL LOW (ref 3.5–5.0)
Alkaline Phosphatase: 480 U/L — ABNORMAL HIGH (ref 38–126)
Anion gap: 12 (ref 5–15)
BUN: 20 mg/dL (ref 8–23)
CHLORIDE: 95 mmol/L — AB (ref 98–111)
CO2: 20 mmol/L — AB (ref 22–32)
CREATININE: 0.99 mg/dL (ref 0.61–1.24)
Calcium: 8.7 mg/dL — ABNORMAL LOW (ref 8.9–10.3)
Glucose, Bld: 93 mg/dL (ref 70–99)
Potassium: 4.4 mmol/L (ref 3.5–5.1)
SODIUM: 127 mmol/L — AB (ref 135–145)
Total Bilirubin: 4.1 mg/dL — ABNORMAL HIGH (ref 0.3–1.2)
Total Protein: 7.4 g/dL (ref 6.5–8.1)

## 2017-11-08 LAB — LIPASE, BLOOD: Lipase: 167 U/L — ABNORMAL HIGH (ref 11–51)

## 2017-11-08 MED ORDER — IOPAMIDOL (ISOVUE-300) INJECTION 61%
100.0000 mL | Freq: Once | INTRAVENOUS | Status: AC | PRN
  Administered 2017-11-08: 100 mL via INTRAVENOUS

## 2017-11-08 MED ORDER — IOPAMIDOL (ISOVUE-300) INJECTION 61%
INTRAVENOUS | Status: AC
  Filled 2017-11-08: qty 100

## 2017-11-08 MED ORDER — SODIUM CHLORIDE 0.9 % IV BOLUS (SEPSIS)
1000.0000 mL | Freq: Once | INTRAVENOUS | Status: AC
  Administered 2017-11-08: 1000 mL via INTRAVENOUS

## 2017-11-08 MED ORDER — IBUPROFEN 200 MG PO TABS
600.0000 mg | ORAL_TABLET | Freq: Once | ORAL | Status: AC
  Administered 2017-11-08: 600 mg via ORAL
  Filled 2017-11-08: qty 3

## 2017-11-08 MED ORDER — SODIUM CHLORIDE 0.9 % IV SOLN
2.0000 g | Freq: Once | INTRAVENOUS | Status: AC
  Administered 2017-11-08: 2 g via INTRAVENOUS
  Filled 2017-11-08: qty 2

## 2017-11-08 MED ORDER — SODIUM CHLORIDE 0.9 % IV BOLUS
1000.0000 mL | Freq: Once | INTRAVENOUS | Status: AC
  Administered 2017-11-08: 1000 mL via INTRAVENOUS

## 2017-11-08 MED ORDER — SODIUM CHLORIDE 0.9 % IJ SOLN
INTRAMUSCULAR | Status: AC
  Filled 2017-11-08: qty 50

## 2017-11-08 MED ORDER — METRONIDAZOLE IN NACL 5-0.79 MG/ML-% IV SOLN
500.0000 mg | Freq: Three times a day (TID) | INTRAVENOUS | Status: DC
  Administered 2017-11-08 – 2017-11-11 (×8): 500 mg via INTRAVENOUS
  Filled 2017-11-08 (×8): qty 100

## 2017-11-08 NOTE — ED Triage Notes (Signed)
Pt BIB [facility] from home. Pt c/o fatigue and generalized weakness. Pt is jaundice upon arrival with hx of liver and lung CA. Pt stated he is unable to take care of himself any longer and requested transport.

## 2017-11-08 NOTE — ED Notes (Signed)
Lactic was 1.89. Result did not cross over.

## 2017-11-08 NOTE — ED Notes (Signed)
Bed: WA17 Expected date:  Expected time:  Means of arrival:  Comments: EMS 62 yo male with lung and liver cancer/weakness [address]

## 2017-11-08 NOTE — Progress Notes (Signed)
A consult was received from an ED provider for Cefepime per pharmacy dosing.  The patient's profile has been reviewed for ht/wt/allergies/indication/available labs.    A one time order has already been placed by provider for Cefepime 2g IV.  Further antibiotics/pharmacy consults should be ordered by admitting physician if indicated.                       Thank you, Luiz Ochoa 11/08/2017  8:31 PM

## 2017-11-08 NOTE — H&P (Signed)
Matthew Lewis:[id number] DOB: [birth year] DOA: 11/08/2017     PCP: [name], MD   Outpatient Specialists:      Oncology   [name] GI Dr.  Sadie Haber ) [place]    Patient arrived to ER on 11/08/17 at [facility]  Patient coming from: home Lives  With family   Chief Complaint:  Chief Complaint  Patient presents with  . Fatigue    HPI: [name] is a 62 y.o. male with medical history significant of  Metastatic cancer with involvement of the liver,  cirrhosis due to alcohol abuse, alcohol abuse now in remission for that past 7 years.  stage II descending colon cancer in 2007,s/p left hemicolectomy, DM 2 CAD sp angioplasty in 2000  Presented with  Epigastric abdominal pain persistent he ahs been taking advil noted at home fever of 102. Not eating well  His drain was obstructed and had to be recently replaced yesterday 11/07/2017 He was finaly able to empty it today and it started to put out copious amount of bile  Regarding pertinent Chronic problems: Liver cancer Have not started on chemotherapy yet as he has been sick EGD 06/2016 per Dr. Silverio Decamp showed varices, last colonoscopy was 5 years ago.   MRCP in the hospital, which showed multiple liver lesions, bilateral adrenal gland masses and retroperitoneal adenopathy concerning for metastasis.He was seen by GI and IR, and underwent percutaneous biliary drainage tube placement on 10/26/17. He underwent CT biopsy of right adrenal mass on 10/29/17 that confirms metastatic adenocarcinoma with morphology that favors colorectal primary over pancreatobiliary. He was discharged same day.   While in ER: Noted to be septic The following Work up has been ordered so far:  Orders Placed This Encounter  Procedures  . Culture, blood (Routine x 2)  . DG Chest 2 View  . CT ABDOMEN PELVIS W CONTRAST  . Comprehensive metabolic panel  . CBC with Differential  . Protime-INR  . Urinalysis, Routine w reflex microscopic  .  Ammonia  . Lipase, blood  . Diet NPO time specified  . Insert / maintain saline lock  . Cardiac monitoring  . Refer to Sidebar Report for: Sepsis Bundle ED/IP  . Document vital signs within 1-hour of fluid bolus completion and notify provider of bolus completion  . Document Actual / Estimated Weight  . Insert peripheral IV x 2  . Initiate Carrier Fluid Protocol  . Call Code Sepsis ([facility] [phone]) Reason for Consult? tracking  . Consult to hospitalist  . Pulse oximetry, continuous  . ED EKG 12-Lead  . EKG 12-Lead    Following Medications were ordered in ER: Medications  metroNIDAZOLE (FLAGYL) IVPB 500 mg ( Intravenous Stopped 11/08/17 2249)  iopamidol (ISOVUE-300) 61 % injection (has no administration in time range)  sodium chloride 0.9 % injection (has no administration in time range)  sodium chloride 0.9 % bolus 1,000 mL (0 mLs Intravenous Stopped 11/08/17 2102)  ibuprofen (ADVIL,MOTRIN) tablet 600 mg (600 mg Oral Given 11/08/17 2037)  sodium chloride 0.9 % bolus 1,000 mL (0 mLs Intravenous Stopped 11/08/17 2201)    And  sodium chloride 0.9 % bolus 1,000 mL (0 mLs Intravenous Stopped 11/08/17 2249)  ceFEPIme (MAXIPIME) 2 g in sodium chloride 0.9 % 100 mL IVPB ( Intravenous Stopped 11/08/17 2116)  iopamidol (ISOVUE-300) 61 % injection 100 mL (100 mLs Intravenous Contrast Given 11/08/17 2143)    Significant initial  Findings: Abnormal Labs Reviewed  COMPREHENSIVE METABOLIC PANEL - Abnormal; Notable for the following  components:      Result Value   Sodium 127 (*)    Chloride 95 (*)    CO2 20 (*)    Calcium 8.7 (*)    Albumin 2.2 (*)    AST 77 (*)    ALT 64 (*)    Alkaline Phosphatase 480 (*)    Total Bilirubin 4.1 (*)    All other components within normal limits  CBC WITH DIFFERENTIAL/PLATELET - Abnormal; Notable for the following components:   WBC 11.3 (*)    RBC 3.07 (*)    Hemoglobin 9.3 (*)    HCT 28.7 (*)    RDW 17.5 (*)    Neutro Abs 9.6 (*)    Abs Immature  Granulocytes 0.10 (*)    All other components within normal limits  LIPASE, BLOOD - Abnormal; Notable for the following components:   Lipase 167 (*)    All other components within normal limits   Lipase 167  Na 127 K 4.4 Cr   stable,  Up from baseline see below Lab Results  Component Value Date   CREATININE 0.99 11/08/2017   CREATININE 0.62 11/06/2017   CREATININE 0.47 (L) 10/29/2017      WBC 11.3 INR 1.18 HG/HCT   Stable,     Component Value Date/Time   HGB 9.3 (L) 11/08/2017 2026   HGB 10.1 (L) 11/06/2017 0904   HGB 15.2 07/18/2006 1420   HCT 28.7 (L) 11/08/2017 2026   HCT 42.8 07/18/2006 1420     Lactic Acid, Venous    Component Value Date/Time   LATICACIDVEN 1.1 02/24/2017 2243      UA ordered    CXR -  NON acute  CTabd/pelvis - stable ductal dilatation, irrhosis with multiple hepatic metastatic disease likely segmental colitis of the hepatic flexure. Small right pleural effusion and bilateral lung base pulmonary  masses/nodules stable. ECG:  Personally reviewed by me showing: HR : 121 Rhythm: Sinus tachycardia   no evidence of ischemic changes QTC 440     ED Triage Vitals  Enc Vitals Group     BP 11/08/17 1945 (!) 87/60     Pulse Rate 11/08/17 1945 (!) 121     Resp 11/08/17 1945 16     Temp 11/08/17 1945 98.9 F (37.2 C)     Temp Source 11/08/17 1945 Oral     SpO2 11/08/17 1940 97 %     Weight --      Height --      Head Circumference --      Peak Flow --      Pain Score 11/08/17 1956 10     Pain Loc --      Pain Edu? --      Excl. in [place]? --   TMAX(24)@       Latest  Blood pressure (!) 98/57, pulse 99, temperature (!) 100.6 F (38.1 C), temperature source Rectal, resp. rate (!) 21, SpO2 98 %.      Hospitalist was called for admission for Sepsis  CT scan worrisome for segmental colitis, also recent history of biliary obstruction   Review of Systems:    Pertinent positives include:  Fevers, chills, fatigue, weight loss abdominal  pain,  Constitutional:  No weight loss, night sweats, HEENT:  No headaches, Difficulty swallowing,Tooth/dental problems,Sore throat,  No sneezing, itching, ear ache, nasal congestion, post nasal drip,  Cardio-vascular:  No chest pain, Orthopnea, PND, anasarca, dizziness, palpitations.no Bilateral lower extremity swelling  GI:  No heartburn, indigestion,  nausea,  vomiting, diarrhea, change in bowel habits, loss of appetite, melena, blood in stool, hematemesis Resp:  no shortness of breath at rest. No dyspnea on exertion, No excess mucus, no productive cough, No non-productive cough, No coughing up of blood.No change in color of mucus.No wheezing. Skin:  no rash or lesions. No jaundice GU:  no dysuria, change in color of urine, no urgency or frequency. No straining to urinate.  No flank pain.  Musculoskeletal:  No joint pain or no joint swelling. No decreased range of motion. No back pain.  Psych:  No change in mood or affect. No depression or anxiety. No memory loss.  Neuro: no localizing neurological complaints, no tingling, no weakness, no double vision, no gait abnormality, no slurred speech, no confusion  All systems reviewed and apart from [place] all are negative  Past Medical History:   Past Medical History:  Diagnosis Date  . Abscess of anal and rectal regions    horse shoe abscess  . Alcoholic cirrhosis ([place])   . Arthritis    "everywhere; especially in my spine"  . Cirrhosis ([place])    STAGE 1 CIRRHOSIS PATIENT SEES DR NAMDIGAM FOR  . Colon cancer ([place]) 05/2005   TX SURGERY WITH LYMPH NODE REMOVAL  . Cough    LAST FEW WEEKS SAW DR RAMOS LUNGS MILKY SPUTUM OCC  . DM type 2 with diabetic peripheral neuropathy (HCC)    left foot  . Fatigue   . Fistula, anal    multiple  . History of substance abuse ([place])    last alcohol was 08/2011; last marijuania was 08/2011  . Hypertension   . PTSD (post-traumatic stress disorder)    "severe" HX OF  . Sleep deprivation        Past Surgical History:  Procedure Laterality Date  . ABSCESS DRAINAGE     "probably 15-20 so far at least; rectal"  . ANAL FISTULECTOMY  12/13/1998  . ANTERIOR FUSION CERVICAL SPINE  2010   "triple"; "did 2 surgeries on the same day in 5053 due to complications"  . CORONARY ANGIOPLASTY  2001  . detached muscle  2010   right chest; "after cervical fusion complications"  . ELBOW SURGERY  2007   "cut out part of a muscle"; left  . EXAMINATION UNDER ANESTHESIA  03/22/2005   fistula  . EXAMINATION UNDER ANESTHESIA  06/06/2011   Procedure: EXAM UNDER ANESTHESIA;  Surgeon: Stark Klein, MD;  Location: [facility];  Service: General;  Laterality: N/A;  . HEMICOLECTOMY  06/06/2005   left  . INCISION AND DRAINAGE PERIRECTAL ABSCESS  03/30/2005  . INCISION AND DRAINAGE PERIRECTAL ABSCESS  12/13/2004  . INCISION AND DRAINAGE PERIRECTAL ABSCESS  07/14/2001  . INCISION AND DRAINAGE PERIRECTAL ABSCESS  08/12/2010   horseshoe abscess; Dr Redmond Pulling  . INCISION AND DRAINAGE PERIRECTAL ABSCESS  06/06/2011   Procedure: IRRIGATION AND DEBRIDEMENT PERIRECTAL ABSCESS;  Surgeon: Stark Klein, MD;  Location: [facility];  Service: General;  Laterality: N/A;  . INCISION AND DRAINAGE PERIRECTAL ABSCESS N/A 02/24/2017   Procedure: IRRIGATION AND DEBRIDEMENT PERIRECTAL ABSCESS;  Surgeon: Ralene Ok, MD;  Location: [facility];  Service: General;  Laterality: N/A;  . IR EXCHANGE BILIARY DRAIN  11/07/2017  . IR INT EXT BILIARY DRAIN WITH CHOLANGIOGRAM  10/26/2017  . IR RADIOLOGIST EVAL & MGMT  11/06/2017  . [place]  . PERCUTANEOUS PINNING PHALANX FRACTURE OF HAND  ~ 2008   "plates in 2 places"; right  . PLACEMENT OF SETON N/A 09/05/2017  Procedure: PLACEMENT OF SETON;  Surgeon: Leighton Ruff, MD;  Location: [facility];  Service: General;  Laterality: N/A;  . RADIOLOGY WITH ANESTHESIA N/A 10/25/2017   Procedure: MRI WITH ANESTHESIA;  Surgeon: Radiologist, Medication, MD;  Location: [facility];  Service:  Radiology;  Laterality: N/A;  . THORACIC DISCECTOMY  1990's    Social History:  Ambulatory   independently       reports that he quit smoking about 26 years ago. His smoking use included cigarettes. He has a 10.00 pack-year smoking history. He has never used smokeless tobacco. He reports that he drinks alcohol. He reports that he has current or past drug history. Drugs: Oxycodone and Marijuana.     Family History:   Family History  Problem Relation Age of Onset  . Heart disease Father   . Heart disease Mother   . Cancer Mother   . Cancer Sister   . Heart disease Brother     Allergies: Allergies  Allergen Reactions  . Morphine And Related Other (See Comments)    PATIENT IS RECOVERING FROM DRUG ADDICTION PATIENT WANTS TO AVOID ANY NARCOTICS  . Other Other (See Comments)    PATIENT IS RECOVERING FROM DRUG ADDICTION PATIENT WANTS TO AVOID ANY NARCOTICS     Prior to Admission medications   Medication Sig Start Date End Date Taking? Authorizing Provider  aspirin EC 81 MG tablet Take 81 mg by mouth daily.   Yes [provider]  atorvastatin (LIPITOR) 40 MG tablet Take 1 tablet (40 mg total) by mouth daily at 6 PM. Patient taking differently: Take 20 mg by mouth daily at 6 PM.  12/02/12  Yes Viyuoh, Adeline C, MD  calcium carbonate (TUMS - DOSED IN MG ELEMENTAL CALCIUM) 500 MG chewable tablet Chew 1 tablet by mouth 2 (two) times daily as needed for indigestion or heartburn.   Yes [provider]  celecoxib (CELEBREX) 200 MG capsule Take 1 capsule by mouth daily. 10/10/17  Yes [provider]  empagliflozin (JARDIANCE) 10 MG TABS tablet Take 10 mg by mouth at bedtime.    Yes [provider]  Fexofenadine-Pseudoephedrine (ALLEGRA-D 24 HOUR PO) Take by mouth daily. WAL/FEX   Yes [provider]  gabapentin (NEURONTIN) 400 MG capsule Take 400-800 mg by mouth 2 (two) times daily. 1 capsule am, 2 capsules pm   Yes [provider]    glipiZIDE (GLUCOTROL) 5 MG tablet Take 5 mg by mouth 2 (two) times daily before a meal.   Yes [provider]  Insulin Detemir (LEVEMIR FLEXPEN) 100 UNIT/ML Pen Inject 10-60 Units into the skin 2 (two) times daily. Take 62 units in the am and Take 10 units in the pm   Yes [provider]  lisinopril (PRINIVIL,ZESTRIL) 20 MG tablet Take 20 mg by mouth daily.   Yes [provider]  metFORMIN (GLUCOPHAGE) 1000 MG tablet Take 1,000 mg by mouth 2 (two) times daily with a meal.   Yes [provider]  Multiple Vitamin (MULTIVITAMIN) tablet Take 1 tablet by mouth daily.   Yes [provider]  ondansetron (ZOFRAN) 8 MG tablet Take 1 tablet (8 mg total) by mouth 2 (two) times daily as needed. Start on the third day after chemotherapy. 11/06/17  Yes [name], MD  oxyCODONE 10 MG TABS Take 1 tablet (10 mg total) by mouth every 4 (four) hours as needed for severe pain. 10/29/17  Yes Nita Sells, MD  polyvinyl alcohol (LIQUIFILM TEARS) 1.4 % ophthalmic solution  1 drop as needed for dry eyes.   Yes [provider]  prochlorperazine (COMPAZINE) 10 MG tablet Take 1 tablet (10 mg total) by mouth every 6 (six) hours as needed (Nausea or vomiting). 11/06/17  Yes [name], MD  ALPRAZolam Duanne Moron) 0.5 MG tablet Take 1 tablet 1 hour before PET scan Patient not taking: Reported on 11/08/2017 11/07/17   Alla Feeling, NP   Physical Exam: Blood pressure (!) 98/57, pulse 99, temperature (!) 100.6 F (38.1 C), temperature source Rectal, resp. rate (!) 21, SpO2 98 %. 1. General:  in No Acute distress  Chronically ill -appearing 2. Psychological: Alert and    Oriented 3. Head/ENT:     Dry Mucous Membranes                          Head Non traumatic, neck supple                           Poor Dentition 4. SKIN:   decreased Skin turgor,  Skin clean Dry and intact no rash 5. Heart: Regular rate and rhythm no  Murmur, no Rub or gallop 6. Lungs:  Clear to  auscultation bilaterally, no wheezes or crackles   7. Abdomen: Soft, non-tender, Non distended  obese  bowel sounds present 8. Lower extremities: no clubbing, cyanosis, or  edema 9. Neurologically Grossly intact, moving all 4 extremities equally no asterexis 10. MSK: Normal range of motion   LABS:     Recent Labs  Lab 11/06/17 0904 11/08/17 2026  WBC 11.8* 11.3*  NEUTROABS 10.1* 9.6*  HGB 10.1* 9.3*  HCT 31.0* 28.7*  MCV 93.9 93.5  PLT 301 449   Basic Metabolic Panel: Recent Labs  Lab 11/06/17 0904 11/08/17 2026  NA 131* 127*  K 4.3 4.4  CL 98 95*  CO2 23 20*  GLUCOSE 136* 93  BUN 10 20  CREATININE 0.62 0.99  CALCIUM 9.0 8.7*      Recent Labs  Lab 11/06/17 0904 11/08/17 2026  AST 75* 77*  ALT 57* 64*  ALKPHOS 467* 480*  BILITOT 4.5* 4.1*  PROT 7.5 7.4  ALBUMIN 1.9* 2.2*   Recent Labs  Lab 11/08/17 2026  LIPASE 167*   No results for input(s): AMMONIA in the last 168 hours.    HbA1C: No results for input(s): HGBA1C in the last 72 hours. CBG: Recent Labs  Lab 11/07/17 1735 11/07/17 1803  GLUCAP 63* 74      Urine analysis:    Component Value Date/Time   COLORURINE AMBER (A) 10/23/2017 1916   APPEARANCEUR CLEAR 10/23/2017 1916   LABSPEC 1.030 10/23/2017 1916   PHURINE 6.0 10/23/2017 1916   GLUCOSEU >=500 (A) 10/23/2017 1916   HGBUR NEGATIVE 10/23/2017 1916   BILIRUBINUR MODERATE (A) 10/23/2017 1916   KETONESUR NEGATIVE 10/23/2017 1916   PROTEINUR NEGATIVE 10/23/2017 1916   UROBILINOGEN 0.2 08/15/2011 1816   NITRITE NEGATIVE 10/23/2017 1916   LEUKOCYTESUR NEGATIVE 10/23/2017 1916      Cultures:    Component Value Date/Time   SDES BLOOD RIGHT ARM 02/24/2017 1918   SPECREQUEST IN PEDIATRIC BOTTLE Blood Culture adequate volume 02/24/2017 1918   CULT  02/24/2017 1918    NO GROWTH 5 DAYS Performed at [facility], [place] [address], [place], [place] [address]    REPTSTATUS 03/01/2017 FINAL 02/24/2017 1918     Radiological Exams  on Admission: Dg Chest 2 View  Result Date: 11/08/2017 CLINICAL DATA:  62 year old male with sepsis. EXAM: CHEST - 2 VIEW COMPARISON:  Chest CT dated 10/27/2017 FINDINGS: There is cardiomegaly with probable mild vascular congestion. No focal consolidation or pneumothorax. Bilateral lung consolidation/masses seen on the prior CT are suboptimally visualized on this radiograph. Possible trace right pleural effusion. No acute osseous pathology. A pigtail tube is noted in the upper abdomen likely corresponding to the percutaneous biliary drain catheter. IMPRESSION: 1. Cardiomegaly with probable mild vascular congestion. 2. Suboptimal visualization of the pulmonary masses/nodules seen on the prior CT. 3. Probable small right pleural effusion. Electronically Signed   By: Anner Crete M.D.   On: 11/08/2017 21:22   Ct Abdomen Pelvis W Contrast  Result Date: 11/08/2017 CLINICAL DATA:  62 year old male with history of lung cancer presenting with generalized weakness and jaundice. EXAM: CT ABDOMEN AND PELVIS WITH CONTRAST TECHNIQUE: Multidetector CT imaging of the abdomen and pelvis was performed using the standard protocol following bolus administration of intravenous contrast. CONTRAST:  187m ISOVUE-300 IOPAMIDOL (ISOVUE-300) INJECTION 61% COMPARISON:  CT of the abdomen pelvis dated 11/06/2016 FINDINGS: Lower chest: Partially visualized small right pleural effusion with associated compressive atelectasis of the right lower lobe. A partially visualized 5.1 x 3.8 cm right lung base mass and a 1.8 x 2.3 cm left lung base nodule. There is coronary vascular calcification. Small pocket of pneumoperitoneum along the lateral aspect of the liver related to biliary drain catheter placement. There is a small ascites. Hepatobiliary: Morphologic changes of cirrhosis. Multiple hepatic hypodense lesions as seen previously most consistent with metastatic disease. Ill-defined 2.1 x 5.1 cm hypodense area in the left lobe of the  liver as seen previously appears to communicate with the left portal vein and concerning for a tumor thrombus or a bland thrombus within the left portal vein. A primary biliary neoplasm such as cholangiocarcinoma or metastatic disease are not excluded. There is layering sludge or small stones within the gallbladder. A percutaneous biliary drain with pigtail tip noted in similar position. No significant interval change in the dilatation of the intrahepatic biliary trees compared to the most recent prior CT. Pancreas: Unremarkable. No pancreatic ductal dilatation or surrounding inflammatory changes. Spleen: Normal in size without focal abnormality. Adrenals/Urinary Tract: Bilateral adrenal masses most consistent with metastatic disease. There is no hydronephrosis on either side. Punctate nonobstructing left renal inferior pole calculus. The visualized ureters and urinary bladder appear unremarkable. Stomach/Bowel: Surgical material noted in the region of the rectum similar to prior CT. Postsurgical changes and suture in the descending colon. There is no bowel obstruction. Mild thickened appearance of the hepatic flexure, likely reactive to cirrhosis and ascites/hepatic colopathy. Colitis is less likely. Clinical correlation is recommended. The appendix is normal. Vascular/Lymphatic: Mild atherosclerotic calcification of the aorta. No portal venous gas. Retroperitoneal adenopathy. A 3.6 x 2.2 cm nodular density with surrounding there is no plastic reaction in the left lower abdomen (series 2, image 65) most consistent with mesenteric or serosal metastatic disease/implant. Reproductive: The prostate and seminal vesicles are grossly unremarkable. Other: Mild diffuse subcutaneous edema. Musculoskeletal: Degenerative changes of the spine. Bilateral L5 pars defects with grade 1 L5-S1 anterolisthesis. No acute osseous pathology. IMPRESSION: 1. Overall no significant interval change since the CT of 11/06/2017. Stable  positioning of the percutaneous biliary drain with similar degree of biliary ductal dilatation. 2. Cirrhosis with multiple hepatic metastatic disease. Infiltrative appearing lesion in the left lobe of the liver appears to extend into the left portal vein and may represent a tumor  thrombus, or bland thrombus. A primary biliary neoplasm (cholangiocarcinoma, or metastatic disease are other considerations. 3. Retroperitoneal, mesenteric, and adrenal metastatic disease. 4. Hepatic colopathy versus less likely segmental colitis of the hepatic flexure. 5. Small right pleural effusion and bilateral lung base pulmonary masses/nodules as seen previously. 6. Tiny pockets of pneumoperitoneum lateral to the liver along the percutaneous drain, decreased since the prior CT. 7. Other findings as above. Electronically Signed   By: Anner Crete M.D.   On: 11/08/2017 22:50   Ir Exchange Biliary Drain  Result Date: 11/08/2017 INDICATION: 62 year old male with a history of previous percutaneous placement of internal external biliary drain. Drain has become withdrawn and he presents for replacement. EXAM: IMAGE GUIDED REPLACEMENT OF INTERNAL/EXTERNAL BILIARY DRAIN. MEDICATIONS: None ANESTHESIA/SEDATION: Moderate (conscious) sedation was employed during this procedure. A total of Versed 1.0 mg and Fentanyl 25 mcg was administered intravenously. Moderate Sedation Time: 10 minutes. The patient's level of consciousness and vital signs were monitored continuously by radiology nursing throughout the procedure under my direct supervision. FLUOROSCOPY TIME:  Fluoroscopy Time: 1 minutes 54 seconds (121 mGy). COMPLICATIONS: None PROCEDURE: Informed written consent was obtained from the patient after a thorough discussion of the procedural risks, benefits and alternatives. All questions were addressed. Maximal Sterile Barrier Technique was utilized including caps, mask, sterile gowns, sterile gloves, sterile drape, hand hygiene and skin  antiseptic. A timeout was performed prior to the initiation of the procedure. Patient positioned supine position on the II table. Images were stored sent to PACs. Patient is prepped and draped in the usual sterile fashion including the indwelling drain. Gentle injection of contrast confirmed the location of the existing drain. Using modified Seldinger technique, the internal external biliary drain was exchanged for a new 10 [place] drain, with the loop curled within the duodenum. Final images were stored confirming flow antegrade. Catheter was sutured in position attached to gravity drainage. Patient tolerated the procedure well and remained hemodynamically stable throughout. No complications were encountered and no significant blood loss. IMPRESSION: Status post exchange of displaced internal/external biliary drain with placement of a new 10 French tube. Signed, Dulcy Fanny. Dellia Nims, RPVI [facility] Radiology Electronically Signed   By: Corrie Mckusick D.O.   On: 11/08/2017 08:30    Chart has been reviewed    Assessment/Plan   62 y.o. male with medical history significant of  Metastatic cancer with involvement of the liver,  cirrhosis due to alcohol abuse, alcohol abuse now in remission for that past 7 years.  stage II descending colon cancer in 2007,s/p left hemicolectomy, DM 2  Admitted for Sepsis likely due to intraabdominal cause  Present on Admission: . Sepsis ([place]) -with hypotension , will admit to stepdown rehydrate administer IV antibiotics obtain serial lactic acid most likely source being intra-abdominal . Elevated lipase -in the setting of recent biliary obstruction now with functioning biliary drain.  We will continue to monitor repeat in a.m. no evidence of pancreatitis . Hypertension -soft blood pressures we will hold home medications . Hyperlipemia -stable continue home medications . CAD (coronary artery disease)- PCI 2001- no  evaluation since-stable continue home medications has been doing well . Moderate protein-calorie malnutrition ([place]) we will order nutritional consult . Alcoholic cirrhosis of liver without ascites (HCC) -alcohol abuse currently in remission cirrhosis likely contributing to patient overall poor prognosis . Hyponatremia in the setting of dehydration we will obtain urine electrolytes could be also related to history of cirrhosis rehydrate and follow  . Obstructive  jaundice -bilirubin has been slightly coming down from prior as biliary drain now working LFTs stable continue to monitor . Intrahepatic cholangiocarcinoma ([place]) -will notify oncology the patient is being admitted at this point he wishes to continue management but also would like to discuss his case with palliative care consult patient has great insight into severity of his illness   . Segmental colitis ([place]) cover with IV antibiotics could be contributing to febrile illness  Dm 2-  - Order Sensitive  SSI   - continue   insulin regimen increased to 50 units every morning as patient have had decreased p.o. intake  -  check TSH and HgA1C  - Hold by mouth medications    Other plan as per orders.  DVT prophylaxis:  SCD   Code Status:    DNR/DNI   as per patient   I had personally discussed CODE STATUS with patient  Family Communication:   Family not at  Bedside    Disposition Plan:  To home once workup is complete and patient is stable                     Would benefit from PT/OT eval prior to DC  Ordered                                       Consults called: not ify oncology pt has been admitted  Admission status:   inpatient     Expect 2 midnight stay secondary to severity of patient's current illness including hemodynamic instability despite optimal treatment (tachycardia  Hypotension )   Severe lab abnormalities including elevated LFTs elevated lipase and extensive comorbidities including: History of substance abuse   DM2      CAD    malignancy, Liver disease That are currently affecting medical management.  I expect  patient to be hospitalized for 2 midnights requiring inpatient medical care.  Patient is at high risk for adverse outcome (such as loss of life or disability) if not treated.  Indication for inpatient stay as follows:  Hemodynamic instability despite maximal medical therapy,    inability to maintain oral hydration secondary to decreased p.o. intake     Need for IV antibiotics, IV fluids,       Level of care     SDU tele indefinitely please discontinue once patient no longer qualifies    Otila Starn 11/08/2017, 1:45 AM    [facility]  Pager [phone]   after 2 AM please page floor coverage PA If 7AM-7PM, please contact the day team taking care of the patient  [email or web address]  Password [email or web address]

## 2017-11-08 NOTE — ED Provider Notes (Addendum)
[facility] DEPT Provider Note   CSN: [id number] Arrival date & time: 11/08/17  1930     History   Chief Complaint Chief Complaint  Patient presents with  . Fatigue    HPI Matthew Lewis is a 62 y.o. 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  Recently diagnosed with cancer and admitted with placement of biliary drain for biliary obstruction.  Repeat CT confirmed retraction of the right internal/external biliary drain and he underwent biliary drain catheter exchange and reposition yesterday with interventional radiology and was found to be stable for discharge home at the time with normal vital signs.  He notes constant abdominal pain and some shortness of breath.  He is a poor historian and has a hard time quantifying how long his pain has been present in describing the quality of his pain.  He denies any nausea or vomiting.  Had a normal bowel movement today.  Denies urinary symptoms.  States that he has difficulty caring for himself since his diagnosis of cancer and with worsening fatigue.  States he is unable to change the biliary drain bag as he cannot reach it.   The history is provided by the patient.    Past Medical History:  Diagnosis Date  . Abscess of anal and rectal regions    horse shoe abscess  . Alcoholic cirrhosis ([place])   . Arthritis    "everywhere; especially in my spine"  . Cirrhosis ([place])    STAGE 1 CIRRHOSIS PATIENT SEES DR NAMDIGAM FOR  . Colon cancer ([place]) 05/2005   TX SURGERY WITH LYMPH NODE REMOVAL  . Cough    LAST FEW WEEKS SAW DR RAMOS LUNGS MILKY SPUTUM OCC  . DM type 2 with diabetic peripheral neuropathy (HCC)    left foot  . Fatigue   .  Fistula, anal    multiple  . History of substance abuse ([place])    last alcohol was 08/2011; last marijuania was 08/2011  . Hypertension   . PTSD (post-traumatic stress disorder)    "severe" HX OF  . Sleep deprivation     Patient Active Problem List   Diagnosis Date Noted  . Intrahepatic cholangiocarcinoma ([place]) 11/06/2017  . Goals of care, counseling/discussion 11/06/2017  . Cirrhosis ([place])   . Malignant neoplasm metastatic to adrenal gland ([place])   . Obstructive jaundice 10/23/2017  . Transaminitis 10/23/2017  . Liver lesion 10/23/2017  . History of colon cancer 10/23/2017  . Perirectal abscess 02/24/2017  . Moderate protein-calorie malnutrition ([place]) 02/24/2017  . Sepsis ([place]) 02/24/2017  . Pulmonary nodule 02/24/2017  . Alcoholic cirrhosis of liver without ascites ([place]) 02/24/2017  . Duodenitis 06/13/2016  . Hyponatremia 06/12/2016  . Nausea & vomiting 06/12/2016  . Thrombocytopenia ([place]) 06/12/2016  . Acute gastroenteritis 06/12/2016  . PTSD (post-traumatic stress disorder) 12/02/2012  . Hx of substance abuse- clean several years now 12/02/2012  . Chest pain with moderate risk of acute coronary syndrome 11/30/2012  . Obesity 11/30/2012  . Hypertension 11/30/2012  . Hyperlipemia 11/30/2012  . Diabetes mellitus ([place]) 11/30/2012  . CAD (coronary artery disease)- PCI 2001- no evaluation since 11/30/2012    Past Surgical History:  Procedure Laterality Date  . ABSCESS DRAINAGE     "probably 15-20 so far at least; rectal"  . ANAL  FISTULECTOMY  12/13/1998  . ANTERIOR FUSION CERVICAL SPINE  2010   "triple"; "did 2 surgeries on the same day in 3235 due to complications"  . CORONARY ANGIOPLASTY  2001  . detached muscle  2010   right chest; "after cervical fusion complications"  . ELBOW SURGERY  2007   "cut out part of a muscle"; left  . EXAMINATION UNDER ANESTHESIA  03/22/2005   fistula  . EXAMINATION UNDER ANESTHESIA  06/06/2011   Procedure: EXAM UNDER ANESTHESIA;  Surgeon:  Stark Klein, MD;  Location: [facility];  Service: General;  Laterality: N/A;  . HEMICOLECTOMY  06/06/2005   left  . INCISION AND DRAINAGE PERIRECTAL ABSCESS  03/30/2005  . INCISION AND DRAINAGE PERIRECTAL ABSCESS  12/13/2004  . INCISION AND DRAINAGE PERIRECTAL ABSCESS  07/14/2001  . INCISION AND DRAINAGE PERIRECTAL ABSCESS  08/12/2010   horseshoe abscess; Dr Redmond Pulling  . INCISION AND DRAINAGE PERIRECTAL ABSCESS  06/06/2011   Procedure: IRRIGATION AND DEBRIDEMENT PERIRECTAL ABSCESS;  Surgeon: Stark Klein, MD;  Location: [facility];  Service: General;  Laterality: N/A;  . INCISION AND DRAINAGE PERIRECTAL ABSCESS N/A 02/24/2017   Procedure: IRRIGATION AND DEBRIDEMENT PERIRECTAL ABSCESS;  Surgeon: Ralene Ok, MD;  Location: [facility];  Service: General;  Laterality: N/A;  . IR EXCHANGE BILIARY DRAIN  11/07/2017  . IR INT EXT BILIARY DRAIN WITH CHOLANGIOGRAM  10/26/2017  . IR RADIOLOGIST EVAL & MGMT  11/06/2017  . [place]  . PERCUTANEOUS PINNING PHALANX FRACTURE OF HAND  ~ 2008   "plates in 2 places"; right  . PLACEMENT OF SETON N/A 09/05/2017   Procedure: PLACEMENT OF SETON;  Surgeon: Leighton Ruff, MD;  Location: [facility];  Service: General;  Laterality: N/A;  . RADIOLOGY WITH ANESTHESIA N/A 10/25/2017   Procedure: MRI WITH ANESTHESIA;  Surgeon: Radiologist, Medication, MD;  Location: [facility];  Service: Radiology;  Laterality: N/A;  . THORACIC DISCECTOMY  1990's        Home Medications    Prior to Admission medications   Medication Sig Start Date End Date Taking? Authorizing Provider  aspirin EC 81 MG tablet Take 81 mg by mouth daily.   Yes [provider]  atorvastatin (LIPITOR) 40 MG tablet Take 1 tablet (40 mg total) by mouth daily at 6 PM. Patient taking differently: Take 20 mg by mouth daily at 6 PM.  12/02/12  Yes Viyuoh, Adeline C, MD  calcium carbonate (TUMS - DOSED IN MG ELEMENTAL CALCIUM) 500 MG chewable tablet Chew 1 tablet by mouth 2 (two) times  daily as needed for indigestion or heartburn.   Yes [provider]  celecoxib (CELEBREX) 200 MG capsule Take 1 capsule by mouth daily. 10/10/17  Yes [provider]  empagliflozin (JARDIANCE) 10 MG TABS tablet Take 10 mg by mouth at bedtime.    Yes [provider]  Fexofenadine-Pseudoephedrine (ALLEGRA-D 24 HOUR PO) Take by mouth daily. WAL/FEX   Yes [provider]  gabapentin (NEURONTIN) 400 MG capsule Take 400-800 mg by mouth 2 (two) times daily. 1 capsule am, 2 capsules pm   Yes [provider]  glipiZIDE (GLUCOTROL) 5 MG tablet Take 5 mg by mouth 2 (two) times daily before a meal.   Yes [provider]  Insulin Detemir (LEVEMIR FLEXPEN) 100 UNIT/ML Pen Inject 10-60 Units into the skin 2 (two) times daily. Take 62 units in the am and Take 10 units in the pm   Yes [provider]  lisinopril (PRINIVIL,ZESTRIL) 20 MG tablet  Take 20 mg by mouth daily.   Yes [provider]  metFORMIN (GLUCOPHAGE) 1000 MG tablet Take 1,000 mg by mouth 2 (two) times daily with a meal.   Yes [provider]  Multiple Vitamin (MULTIVITAMIN) tablet Take 1 tablet by mouth daily.   Yes [provider]  ondansetron (ZOFRAN) 8 MG tablet Take 1 tablet (8 mg total) by mouth 2 (two) times daily as needed. Start on the third day after chemotherapy. 11/06/17  Yes [name], MD  oxyCODONE 10 MG TABS Take 1 tablet (10 mg total) by mouth every 4 (four) hours as needed for severe pain. 10/29/17  Yes Nita Sells, MD  polyvinyl alcohol (LIQUIFILM TEARS) 1.4 % ophthalmic solution 1 drop as needed for dry eyes.   Yes [provider]  prochlorperazine (COMPAZINE) 10 MG tablet Take 1 tablet (10 mg total) by mouth every 6 (six) hours as needed (Nausea or vomiting). 11/06/17  Yes [name], MD  ALPRAZolam Duanne Moron) 0.5 MG tablet Take 1 tablet 1 hour before PET scan Patient not taking: Reported on 11/08/2017 11/07/17   Alla Feeling, NP     Family History Family History  Problem Relation Age of Onset  . Heart disease Father   . Heart disease Mother   . Cancer Mother   . Cancer Sister   . Heart disease Brother     Social History Social History   Tobacco Use  . Smoking status: Former Smoker    Packs/day: 1.00    Years: 10.00    Pack years: 10.00    Types: Cigarettes    Last attempt to quit: 01/08/1991    Years since quitting: 26.8  . Smokeless tobacco: Never Used  Substance Use Topics  . Alcohol use: Yes    Comment: 06/05/11 "drank enough when I was younger to last me my whole life; don't remember when I had my last drink, maybe 2012"  . Drug use: Not Currently    Types: Oxycodone, Marijuana    Comment: 06/05/11 "pot head in my younger days; 2009 dr had me on oxycodone and valium for almost 64yr"     Allergies   Morphine and related and Other   Review of Systems Review of Systems  Constitutional: Positive for fever.  Respiratory: Positive for shortness of breath.   Cardiovascular: Negative for chest pain.  Gastrointestinal: Positive for abdominal pain. Negative for nausea and vomiting.  Genitourinary: Negative for dysuria, frequency, hematuria and urgency.  All other systems reviewed and are negative.    Physical Exam Updated Vital Signs BP (!) 98/57 (BP Location: Right Arm)   Pulse 99   Temp (!) 100.6 F (38.1 C) (Rectal)   Resp (!) 21   SpO2 98%   Physical Exam  Constitutional: He is oriented to person, place, and time. He appears well-developed and well-nourished. No distress.  HENT:  Head: Normocephalic and atraumatic.  Eyes: Pupils are equal, round, and reactive to light. Conjunctivae and EOM are normal. Right eye exhibits no discharge. Left eye exhibits no discharge. Scleral icterus is present.  Neck: Normal range of motion. Neck supple. No JVD present. No tracheal deviation present.  Cardiovascular:  Tachycardic, 2+ radial and DP/PT pulses bilaterally, no lower extremity edema   Pulmonary/Chest: Effort normal.  Abdominal: Soft. Bowel sounds are normal. He exhibits distension. There is generalized tenderness. There is no guarding.  Biliary drain in place, no surrounding erythema, induration, or purulent drainage.  Bilious output noted.  Musculoskeletal: He exhibits no edema.  Neurological:  He is alert and oriented to person, place, and time.  Fluent speech, no facial droop.  Mildly confused.  Skin: Skin is warm and dry. No erythema.  jaundiced  Psychiatric: He has a normal mood and affect. His behavior is normal.  Nursing note and vitals reviewed.    ED Treatments / Results  Labs (all labs ordered are listed, but only abnormal results are displayed) Labs Reviewed  COMPREHENSIVE METABOLIC PANEL - Abnormal; Notable for the following components:      Result Value   Sodium 127 (*)    Chloride 95 (*)    CO2 20 (*)    Calcium 8.7 (*)    Albumin 2.2 (*)    AST 77 (*)    ALT 64 (*)    Alkaline Phosphatase 480 (*)    Total Bilirubin 4.1 (*)    All other components within normal limits  CBC WITH DIFFERENTIAL/PLATELET - Abnormal; Notable for the following components:   WBC 11.3 (*)    RBC 3.07 (*)    Hemoglobin 9.3 (*)    HCT 28.7 (*)    RDW 17.5 (*)    Neutro Abs 9.6 (*)    Abs Immature Granulocytes 0.10 (*)    All other components within normal limits  LIPASE, BLOOD - Abnormal; Notable for the following components:   Lipase 167 (*)    All other components within normal limits  CULTURE, BLOOD (ROUTINE X 2)  CULTURE, BLOOD (ROUTINE X 2)  PROTIME-INR  URINALYSIS, ROUTINE W REFLEX MICROSCOPIC  AMMONIA  I-STAT CG4 LACTIC ACID, ED    EKG EKG Interpretation  Date/Time:  Saturday November 08 2017 19:44:59 EDT Ventricular Rate:  121 PR Interval:    QRS Duration: 105 QT Interval:  310 QTC Calculation: 440 R Axis:   93 Text Interpretation:  Sinus tachycardia Right axis deviation Borderline T abnormalities, inferior leads similar pattern to prior 8/19  Confirmed by Aletta Edouard [phone]) on 11/08/2017 8:38:13 PM   Radiology Dg Chest 2 View  Result Date: 11/08/2017 CLINICAL DATA:  62 year old male with sepsis. EXAM: CHEST - 2 VIEW COMPARISON:  Chest CT dated 10/27/2017 FINDINGS: There is cardiomegaly with probable mild vascular congestion. No focal consolidation or pneumothorax. Bilateral lung consolidation/masses seen on the prior CT are suboptimally visualized on this radiograph. Possible trace right pleural effusion. No acute osseous pathology. A pigtail tube is noted in the upper abdomen likely corresponding to the percutaneous biliary drain catheter. IMPRESSION: 1. Cardiomegaly with probable mild vascular congestion. 2. Suboptimal visualization of the pulmonary masses/nodules seen on the prior CT. 3. Probable small right pleural effusion. Electronically Signed   By: Anner Crete M.D.   On: 11/08/2017 21:22   Ct Abdomen Pelvis W Contrast  Result Date: 11/08/2017 CLINICAL DATA:  62 year old male with history of lung cancer presenting with generalized weakness and jaundice. EXAM: CT ABDOMEN AND PELVIS WITH CONTRAST TECHNIQUE: Multidetector CT imaging of the abdomen and pelvis was performed using the standard protocol following bolus administration of intravenous contrast. CONTRAST:  178mL ISOVUE-300 IOPAMIDOL (ISOVUE-300) INJECTION 61% COMPARISON:  CT of the abdomen pelvis dated 11/06/2016 FINDINGS: Lower chest: Partially visualized small right pleural effusion with associated compressive atelectasis of the right lower lobe. A partially visualized 5.1 x 3.8 cm right lung base mass and a 1.8 x 2.3 cm left lung base nodule. There is coronary vascular calcification. Small pocket of pneumoperitoneum along the lateral aspect of the liver related to biliary drain catheter placement. There is a small ascites. Hepatobiliary: Morphologic changes  of cirrhosis. Multiple hepatic hypodense lesions as seen previously most consistent with metastatic disease.  Ill-defined 2.1 x 5.1 cm hypodense area in the left lobe of the liver as seen previously appears to communicate with the left portal vein and concerning for a tumor thrombus or a bland thrombus within the left portal vein. A primary biliary neoplasm such as cholangiocarcinoma or metastatic disease are not excluded. There is layering sludge or small stones within the gallbladder. A percutaneous biliary drain with pigtail tip noted in similar position. No significant interval change in the dilatation of the intrahepatic biliary trees compared to the most recent prior CT. Pancreas: Unremarkable. No pancreatic ductal dilatation or surrounding inflammatory changes. Spleen: Normal in size without focal abnormality. Adrenals/Urinary Tract: Bilateral adrenal masses most consistent with metastatic disease. There is no hydronephrosis on either side. Punctate nonobstructing left renal inferior pole calculus. The visualized ureters and urinary bladder appear unremarkable. Stomach/Bowel: Surgical material noted in the region of the rectum similar to prior CT. Postsurgical changes and suture in the descending colon. There is no bowel obstruction. Mild thickened appearance of the hepatic flexure, likely reactive to cirrhosis and ascites/hepatic colopathy. Colitis is less likely. Clinical correlation is recommended. The appendix is normal. Vascular/Lymphatic: Mild atherosclerotic calcification of the aorta. No portal venous gas. Retroperitoneal adenopathy. A 3.6 x 2.2 cm nodular density with surrounding there is no plastic reaction in the left lower abdomen (series 2, image 65) most consistent with mesenteric or serosal metastatic disease/implant. Reproductive: The prostate and seminal vesicles are grossly unremarkable. Other: Mild diffuse subcutaneous edema. Musculoskeletal: Degenerative changes of the spine. Bilateral L5 pars defects with grade 1 L5-S1 anterolisthesis. No acute osseous pathology. IMPRESSION: 1. Overall no  significant interval change since the CT of 11/06/2017. Stable positioning of the percutaneous biliary drain with similar degree of biliary ductal dilatation. 2. Cirrhosis with multiple hepatic metastatic disease. Infiltrative appearing lesion in the left lobe of the liver appears to extend into the left portal vein and may represent a tumor thrombus, or bland thrombus. A primary biliary neoplasm (cholangiocarcinoma, or metastatic disease are other considerations. 3. Retroperitoneal, mesenteric, and adrenal metastatic disease. 4. Hepatic colopathy versus less likely segmental colitis of the hepatic flexure. 5. Small right pleural effusion and bilateral lung base pulmonary masses/nodules as seen previously. 6. Tiny pockets of pneumoperitoneum lateral to the liver along the percutaneous drain, decreased since the prior CT. 7. Other findings as above. Electronically Signed   By: Anner Crete M.D.   On: 11/08/2017 22:50   Ir Exchange Biliary Drain  Result Date: 11/08/2017 INDICATION: 62 year old male with a history of previous percutaneous placement of internal external biliary drain. Drain has become withdrawn and he presents for replacement. EXAM: IMAGE GUIDED REPLACEMENT OF INTERNAL/EXTERNAL BILIARY DRAIN. MEDICATIONS: None ANESTHESIA/SEDATION: Moderate (conscious) sedation was employed during this procedure. A total of Versed 1.0 mg and Fentanyl 25 mcg was administered intravenously. Moderate Sedation Time: 10 minutes. The patient's level of consciousness and vital signs were monitored continuously by radiology nursing throughout the procedure under my direct supervision. FLUOROSCOPY TIME:  Fluoroscopy Time: 1 minutes 54 seconds (121 mGy). COMPLICATIONS: None PROCEDURE: Informed written consent was obtained from the patient after a thorough discussion of the procedural risks, benefits and alternatives. All questions were addressed. Maximal Sterile Barrier Technique was utilized including caps, mask, sterile  gowns, sterile gloves, sterile drape, hand hygiene and skin antiseptic. A timeout was performed prior to the initiation of the procedure. Patient positioned supine position on the II table. Images were  stored sent to PACs. Patient is prepped and draped in the usual sterile fashion including the indwelling drain. Gentle injection of contrast confirmed the location of the existing drain. Using modified Seldinger technique, the internal external biliary drain was exchanged for a new 10 [place] drain, with the loop curled within the duodenum. Final images were stored confirming flow antegrade. Catheter was sutured in position attached to gravity drainage. Patient tolerated the procedure well and remained hemodynamically stable throughout. No complications were encountered and no significant blood loss. IMPRESSION: Status post exchange of displaced internal/external biliary drain with placement of a new 10 French tube. Signed, Dulcy Fanny. Dellia Nims, RPVI [facility] Radiology Electronically Signed   By: Corrie Mckusick D.O.   On: 11/08/2017 08:30    Procedures .Critical Care Performed by: Renita Papa, PA-C Authorized by: Renita Papa, PA-C   Critical care provider statement:    Critical care time (minutes):  45   Critical care was necessary to treat or prevent imminent or life-threatening deterioration of the following conditions:  Sepsis   Critical care was time spent personally by me on the following activities:  Discussions with consultants, evaluation of patient's response to treatment, examination of patient, ordering and performing treatments and interventions, ordering and review of laboratory studies, ordering and review of radiographic studies, pulse oximetry, re-evaluation of patient's condition, obtaining history from patient or surrogate and review of old charts   I assumed direction of critical care for this patient from another provider in my  specialty: no     (including critical care time)  Medications Ordered in ED Medications  metroNIDAZOLE (FLAGYL) IVPB 500 mg ( Intravenous Stopped 11/08/17 2249)  iopamidol (ISOVUE-300) 61 % injection (has no administration in time range)  sodium chloride 0.9 % injection (has no administration in time range)  sodium chloride 0.9 % bolus 1,000 mL (0 mLs Intravenous Stopped 11/08/17 2102)  ibuprofen (ADVIL,MOTRIN) tablet 600 mg (600 mg Oral Given 11/08/17 2037)  sodium chloride 0.9 % bolus 1,000 mL (0 mLs Intravenous Stopped 11/08/17 2201)    And  sodium chloride 0.9 % bolus 1,000 mL (0 mLs Intravenous Stopped 11/08/17 2249)  ceFEPIme (MAXIPIME) 2 g in sodium chloride 0.9 % 100 mL IVPB ( Intravenous Stopped 11/08/17 2116)  iopamidol (ISOVUE-300) 61 % injection 100 mL (100 mLs Intravenous Contrast Given 11/08/17 2143)     Initial Impression / Assessment and Plan / ED Course  I have reviewed the triage vital signs and the nursing notes.  Pertinent labs & imaging results that were available during my care of the patient were reviewed by me and considered in my medical decision making (see chart for details).  Clinical Course as of Nov 08 2309  Sat Nov 08, 5656  4525 62 year old male with history of liver and lung cancer here with confusion and fatigue.  He is noted to be febrile here with a low blood pressure and elevated heart rate.  He has been given empiric antibiotics and he is getting lab work.  He just had a procedure to put in a drain for his abdomen so this may be related to his new symptoms.  Will need admission.   [MB]    Clinical Course User Index [MB] Matthew Rasmussen, MD    Patient presents for evaluation of fever and abdominal pain.  He is 1 day status post percutaneous biliary drain for biliary obstruction.  He presents febrile, tachycardic, and hypotensive.  Code sepsis was called and  he was given 3 L of normal saline and IV antibiotics with modest improvement in blood pressure.   Fever and tachycardia improved with ibuprofen.  Lab work reviewed by me shows stable leukocytosis, elevated LFTs which appear to be stable as well.  Creatinine within normal limits.  Lipase does appear acutely elevated, unsure of clinical significance at this time.  Chest x-ray shows cardiomegaly with mild vascular congestion.  CT scan of the abdomen and pelvis shows overall no significant interval change since CT 2 days ago.  There is stable positioning of the percutaneous biliary drain with similar degree of biliary ductal dilatation. No evidence of surgical emergency at this time. Spoke with Dr. Roel Cluck with [facility] hospitalist service who agrees to assume care of patient and bring him into the hospital for further evaluation and management.  Patient seen and evaluated by Dr. Melina Copa who agrees with assessment and plan at this time.  Final Clinical Impressions(s) / ED Diagnoses   Final diagnoses:  Sepsis, due to unspecified organism, unspecified whether acute organ dysfunction present [facility])    ED Discharge Orders    None       Debroah Baller 11/08/17 2315    Matthew Rasmussen, MD 11/09/17 1258  Addendum: Additional documentation provided to reflect critical care services provided during encounter.     Renita Papa, PA-C 01/13/18 2340    Matthew Rasmussen, MD 01/14/18 [phone]

## 2017-11-08 NOTE — ED Notes (Signed)
Patient transported to CT 

## 2017-11-09 DIAGNOSIS — C221 Intrahepatic bile duct carcinoma: Secondary | ICD-10-CM

## 2017-11-09 DIAGNOSIS — E119 Type 2 diabetes mellitus without complications: Secondary | ICD-10-CM

## 2017-11-09 DIAGNOSIS — R748 Abnormal levels of other serum enzymes: Secondary | ICD-10-CM

## 2017-11-09 DIAGNOSIS — I10 Essential (primary) hypertension: Secondary | ICD-10-CM

## 2017-11-09 DIAGNOSIS — K831 Obstruction of bile duct: Secondary | ICD-10-CM

## 2017-11-09 DIAGNOSIS — K5010 Crohn's disease of large intestine without complications: Secondary | ICD-10-CM

## 2017-11-09 DIAGNOSIS — Z794 Long term (current) use of insulin: Secondary | ICD-10-CM

## 2017-11-09 LAB — COMPREHENSIVE METABOLIC PANEL
ALT: 50 U/L — ABNORMAL HIGH (ref 0–44)
AST: 60 U/L — ABNORMAL HIGH (ref 15–41)
Albumin: 2.1 g/dL — ABNORMAL LOW (ref 3.5–5.0)
Alkaline Phosphatase: 367 U/L — ABNORMAL HIGH (ref 38–126)
Anion gap: 11 (ref 5–15)
BILIRUBIN TOTAL: 4.1 mg/dL — AB (ref 0.3–1.2)
BUN: 19 mg/dL (ref 8–23)
CO2: 19 mmol/L — ABNORMAL LOW (ref 22–32)
CREATININE: 0.56 mg/dL — AB (ref 0.61–1.24)
Calcium: 8.4 mg/dL — ABNORMAL LOW (ref 8.9–10.3)
Chloride: 101 mmol/L (ref 98–111)
Glucose, Bld: 82 mg/dL (ref 70–99)
POTASSIUM: 4.1 mmol/L (ref 3.5–5.1)
Sodium: 131 mmol/L — ABNORMAL LOW (ref 135–145)
TOTAL PROTEIN: 6.8 g/dL (ref 6.5–8.1)

## 2017-11-09 LAB — CBC
HEMATOCRIT: 30.5 % — AB (ref 39.0–52.0)
HEMOGLOBIN: 9.5 g/dL — AB (ref 13.0–17.0)
MCH: 30.2 pg (ref 26.0–34.0)
MCHC: 31.1 g/dL (ref 30.0–36.0)
MCV: 96.8 fL (ref 80.0–100.0)
Platelets: 284 10*3/uL (ref 150–400)
RBC: 3.15 MIL/uL — ABNORMAL LOW (ref 4.22–5.81)
RDW: 17.5 % — AB (ref 11.5–15.5)
WBC: 7.2 10*3/uL (ref 4.0–10.5)
nRBC: 0 % (ref 0.0–0.2)

## 2017-11-09 LAB — BLOOD CULTURE ID PANEL (REFLEXED)
ACINETOBACTER BAUMANNII: NOT DETECTED
CANDIDA ALBICANS: NOT DETECTED
CANDIDA PARAPSILOSIS: NOT DETECTED
CANDIDA TROPICALIS: NOT DETECTED
CARBAPENEM RESISTANCE: NOT DETECTED
Candida glabrata: NOT DETECTED
Candida krusei: NOT DETECTED
ENTEROBACTER CLOACAE COMPLEX: NOT DETECTED
Enterobacteriaceae species: DETECTED — AB
Enterococcus species: NOT DETECTED
Escherichia coli: NOT DETECTED
HAEMOPHILUS INFLUENZAE: NOT DETECTED
KLEBSIELLA PNEUMONIAE: NOT DETECTED
Klebsiella oxytoca: DETECTED — AB
Listeria monocytogenes: NOT DETECTED
NEISSERIA MENINGITIDIS: NOT DETECTED
PROTEUS SPECIES: NOT DETECTED
Pseudomonas aeruginosa: NOT DETECTED
SERRATIA MARCESCENS: NOT DETECTED
STAPHYLOCOCCUS SPECIES: NOT DETECTED
STREPTOCOCCUS AGALACTIAE: NOT DETECTED
STREPTOCOCCUS SPECIES: NOT DETECTED
Staphylococcus aureus (BCID): NOT DETECTED
Streptococcus pneumoniae: NOT DETECTED
Streptococcus pyogenes: NOT DETECTED

## 2017-11-09 LAB — URINALYSIS, ROUTINE W REFLEX MICROSCOPIC
Bacteria, UA: NONE SEEN
Bilirubin Urine: NEGATIVE
HGB URINE DIPSTICK: NEGATIVE
Ketones, ur: NEGATIVE mg/dL
LEUKOCYTES UA: NEGATIVE
NITRITE: NEGATIVE
PH: 5 (ref 5.0–8.0)
Protein, ur: NEGATIVE mg/dL
SPECIFIC GRAVITY, URINE: 1.043 — AB (ref 1.005–1.030)

## 2017-11-09 LAB — GLUCOSE, CAPILLARY
GLUCOSE-CAPILLARY: 139 mg/dL — AB (ref 70–99)
Glucose-Capillary: 116 mg/dL — ABNORMAL HIGH (ref 70–99)

## 2017-11-09 LAB — MAGNESIUM: Magnesium: 2 mg/dL (ref 1.7–2.4)

## 2017-11-09 LAB — CBG MONITORING, ED
GLUCOSE-CAPILLARY: 76 mg/dL (ref 70–99)
GLUCOSE-CAPILLARY: 116 mg/dL — AB (ref 70–99)
GLUCOSE-CAPILLARY: 116 mg/dL — AB (ref 70–99)
Glucose-Capillary: 88 mg/dL (ref 70–99)
Glucose-Capillary: 122 mg/dL — ABNORMAL HIGH (ref 70–99)

## 2017-11-09 LAB — TSH: TSH: 2.256 u[IU]/mL (ref 0.350–4.500)

## 2017-11-09 LAB — MRSA PCR SCREENING: MRSA by PCR: NEGATIVE

## 2017-11-09 LAB — HEMOGLOBIN A1C
HEMOGLOBIN A1C: 4.9 % (ref 4.8–5.6)
MEAN PLASMA GLUCOSE: 93.93 mg/dL

## 2017-11-09 LAB — LACTIC ACID, PLASMA
LACTIC ACID, VENOUS: 1.7 mmol/L (ref 0.5–1.9)
Lactic Acid, Venous: 1.1 mmol/L (ref 0.5–1.9)

## 2017-11-09 LAB — OSMOLALITY, URINE: OSMOLALITY UR: 529 mosm/kg (ref 300–900)

## 2017-11-09 LAB — PHOSPHORUS: PHOSPHORUS: 3.3 mg/dL (ref 2.5–4.6)

## 2017-11-09 LAB — PROCALCITONIN: Procalcitonin: 0.66 ng/mL

## 2017-11-09 LAB — CREATININE, URINE, RANDOM: Creatinine, Urine: 124.62 mg/dL

## 2017-11-09 LAB — SODIUM, URINE, RANDOM: Sodium, Ur: 18 mmol/L

## 2017-11-09 LAB — LIPASE, BLOOD: Lipase: 62 U/L — ABNORMAL HIGH (ref 11–51)

## 2017-11-09 MED ORDER — ACETAMINOPHEN 650 MG RE SUPP: 650.0000 mg | Freq: Four times a day (QID) | RECTAL | Status: DC | PRN

## 2017-11-09 MED ORDER — ASPIRIN EC 81 MG PO TBEC
81.0000 mg | DELAYED_RELEASE_TABLET | Freq: Every day | ORAL | Status: DC
  Administered 2017-11-09 – 2017-11-11 (×3): 81 mg via ORAL
  Filled 2017-11-09 (×3): qty 1

## 2017-11-09 MED ORDER — ONDANSETRON HCL 4 MG PO TABS: 4.0000 mg | ORAL_TABLET | Freq: Four times a day (QID) | ORAL | Status: DC | PRN

## 2017-11-09 MED ORDER — INSULIN ASPART 100 UNIT/ML ~~LOC~~ SOLN
0.0000 [IU] | SUBCUTANEOUS | Status: DC
  Administered 2017-11-09 – 2017-11-10 (×4): 1 [IU] via SUBCUTANEOUS
  Filled 2017-11-09: qty 1

## 2017-11-09 MED ORDER — ONDANSETRON HCL 4 MG/2ML IJ SOLN: 4.0000 mg | Freq: Four times a day (QID) | INTRAMUSCULAR | Status: DC | PRN

## 2017-11-09 MED ORDER — ENOXAPARIN SODIUM 40 MG/0.4ML ~~LOC~~ SOLN
40.0000 mg | SUBCUTANEOUS | Status: DC
  Administered 2017-11-09 – 2017-11-10 (×2): 40 mg via SUBCUTANEOUS
  Filled 2017-11-09 (×2): qty 0.4

## 2017-11-09 MED ORDER — INSULIN DETEMIR 100 UNIT/ML ~~LOC~~ SOLN
50.0000 [IU] | Freq: Every morning | SUBCUTANEOUS | Status: DC
  Administered 2017-11-09 – 2017-11-11 (×3): 50 [IU] via SUBCUTANEOUS
  Filled 2017-11-09 (×3): qty 0.5

## 2017-11-09 MED ORDER — GABAPENTIN 400 MG PO CAPS
400.0000 mg | ORAL_CAPSULE | Freq: Every day | ORAL | Status: DC
  Administered 2017-11-10 – 2017-11-11 (×2): 400 mg via ORAL
  Filled 2017-11-09 (×2): qty 1

## 2017-11-09 MED ORDER — SODIUM CHLORIDE 0.9 % IV SOLN
2.0000 g | Freq: Three times a day (TID) | INTRAVENOUS | Status: DC
  Administered 2017-11-09 – 2017-11-11 (×7): 2 g via INTRAVENOUS
  Filled 2017-11-09 (×10): qty 2

## 2017-11-09 MED ORDER — ENSURE ENLIVE PO LIQD
237.0000 mL | Freq: Two times a day (BID) | ORAL | Status: DC
  Administered 2017-11-10: 237 mL via ORAL

## 2017-11-09 MED ORDER — GABAPENTIN 400 MG PO CAPS
800.0000 mg | ORAL_CAPSULE | Freq: Every day | ORAL | Status: DC
  Administered 2017-11-09 – 2017-11-10 (×2): 800 mg via ORAL
  Filled 2017-11-09 (×2): qty 2

## 2017-11-09 MED ORDER — VANCOMYCIN HCL 10 G IV SOLR
2000.0000 mg | Freq: Once | INTRAVENOUS | Status: AC
  Administered 2017-11-09: 2000 mg via INTRAVENOUS
  Filled 2017-11-09: qty 2000

## 2017-11-09 MED ORDER — SODIUM CHLORIDE 0.9 % IV SOLN
INTRAVENOUS | Status: AC
  Administered 2017-11-09: 100 mL/h via INTRAVENOUS

## 2017-11-09 MED ORDER — GABAPENTIN 400 MG PO CAPS
400.0000 mg | ORAL_CAPSULE | Freq: Two times a day (BID) | ORAL | Status: DC
  Administered 2017-11-09: 400 mg via ORAL
  Filled 2017-11-09: qty 1

## 2017-11-09 MED ORDER — ATORVASTATIN CALCIUM 10 MG PO TABS
20.0000 mg | ORAL_TABLET | Freq: Every day | ORAL | Status: DC
  Administered 2017-11-09 – 2017-11-10 (×2): 20 mg via ORAL
  Filled 2017-11-09 (×2): qty 2

## 2017-11-09 MED ORDER — VANCOMYCIN HCL IN DEXTROSE 1-5 GM/200ML-% IV SOLN
1000.0000 mg | Freq: Two times a day (BID) | INTRAVENOUS | Status: DC
  Administered 2017-11-09 – 2017-11-10 (×2): 1000 mg via INTRAVENOUS
  Filled 2017-11-09 (×2): qty 200

## 2017-11-09 MED ORDER — ACETAMINOPHEN 325 MG PO TABS: 650.0000 mg | ORAL_TABLET | Freq: Four times a day (QID) | ORAL | Status: DC | PRN

## 2017-11-09 NOTE — ED Notes (Signed)
Sent hospitalist order for sliding scale insulin

## 2017-11-09 NOTE — Progress Notes (Signed)
PHARMACY - PHYSICIAN COMMUNICATION CRITICAL VALUE ALERT - BLOOD CULTURE IDENTIFICATION (BCID)  Matthew Lewis is an 62 y.o. male who presented to [facility] on 11/08/2017 with a chief complaint of fatigue.  Assessment:  Pt has history of metastatic cancer and cirrhosis, being admitted with sepsis.  Name of physician (or Provider) Contacted: Dr. Darrick Meigs  Current antibiotics: Cefepime, vancomycin, metronidazole  Changes to prescribed antibiotics recommended: Per BCID treatment algorithm, CTX 2 g IV daily suggested for Klebsiella oxytoca  bacteremia. MD would like to continue cefepime, metronidazole, and vancomycin at this time and continue to monitor cultures.  Results for orders placed or performed during the hospital encounter of 11/08/17  Blood Culture ID Panel (Reflexed) (Collected: 11/08/2017  8:26 PM)  Result Value Ref Range   Enterococcus species NOT DETECTED NOT DETECTED   Listeria monocytogenes NOT DETECTED NOT DETECTED   Staphylococcus species NOT DETECTED NOT DETECTED   Staphylococcus aureus (BCID) NOT DETECTED NOT DETECTED   Streptococcus species NOT DETECTED NOT DETECTED   Streptococcus agalactiae NOT DETECTED NOT DETECTED   Streptococcus pneumoniae NOT DETECTED NOT DETECTED   Streptococcus pyogenes NOT DETECTED NOT DETECTED   Acinetobacter baumannii NOT DETECTED NOT DETECTED   Enterobacteriaceae species DETECTED (A) NOT DETECTED   Enterobacter cloacae complex NOT DETECTED NOT DETECTED   Escherichia coli NOT DETECTED NOT DETECTED   Klebsiella oxytoca DETECTED (A) NOT DETECTED   Klebsiella pneumoniae NOT DETECTED NOT DETECTED   Proteus species NOT DETECTED NOT DETECTED   Serratia marcescens NOT DETECTED NOT DETECTED   Carbapenem resistance NOT DETECTED NOT DETECTED   Haemophilus influenzae NOT DETECTED NOT DETECTED   Neisseria meningitidis NOT DETECTED NOT DETECTED   Pseudomonas aeruginosa NOT DETECTED NOT DETECTED   Candida albicans NOT DETECTED NOT DETECTED   Candida  glabrata NOT DETECTED NOT DETECTED   Candida krusei NOT DETECTED NOT DETECTED   Candida parapsilosis NOT DETECTED NOT DETECTED   Candida tropicalis NOT DETECTED NOT [employer], PharmD Clinical Pharmacist 11/09/2017  2:00 PM

## 2017-11-09 NOTE — ED Notes (Signed)
Pt provided with meal tray.

## 2017-11-09 NOTE — ED Notes (Signed)
Called pharmacy to verify and send ordered medications

## 2017-11-09 NOTE — Progress Notes (Signed)
Pharmacy Antibiotic Note  Matthew Lewis is a 62 y.o. male admitted on 11/08/2017 with intra-abdominal.  Pharmacy has been consulted for cefepime and vancomycin dosing.  Plan: Cefepime 2 Gm IV q8h Vancomycin 2 Gm x1 then 1 Gm IV q12h for est AUC = 516 Goal AUC = 400-500 F/u scr/cultures/levels     Temp (24hrs), Avg:100.6 F (38.1 C), Min:98.9 F (37.2 C), Max:102.4 F (39.1 C)  Recent Labs  Lab 11/06/17 0904 11/08/17 2026  WBC 11.8* 11.3*  CREATININE 0.62 0.99    Estimated Creatinine Clearance: 90.7 mL/min (by C-G formula based on SCr of 0.99 mg/dL).    Allergies  Allergen Reactions  . Morphine And Related Other (See Comments)    PATIENT IS RECOVERING FROM DRUG ADDICTION PATIENT WANTS TO AVOID ANY NARCOTICS  . Other Other (See Comments)    PATIENT IS RECOVERING FROM DRUG ADDICTION PATIENT WANTS TO AVOID ANY NARCOTICS    Antimicrobials this admission: 11/2 cefepime >>  11/2 vancomycin >>  11/2 flagyl >>  Dose adjustments this admission:   Microbiology results:  BCx:   UCx:    Sputum:    MRSA PCR:   Thank you for allowing pharmacy to be a part of this patient's care.  Dorrene German 11/09/2017 3:46 AM

## 2017-11-09 NOTE — ED Notes (Signed)
ED TO INPATIENT HANDOFF REPORT  Name/Age/Gender Matthew Lewis 62 y.o. male  Code Status    Code Status Orders  (From admission, onward)         Start     Ordered   11/09/17 0733  Do not attempt resuscitation (DNR)  Continuous    Question Answer Comment  In the event of cardiac or respiratory ARREST Do not call a "code blue"   In the event of cardiac or respiratory ARREST Do not perform Intubation, CPR, defibrillation or ACLS   In the event of cardiac or respiratory ARREST Use medication by any route, position, wound care, and other measures to relive pain and suffering. May use oxygen, suction and manual treatment of airway obstruction as needed for comfort.      11/09/17 0732        Code Status History    Date Active Date Inactive Code Status Order ID Comments User Context   10/23/2017 2256 10/29/2017 1951 Full Code [id number]  Barkley, [place], DO ED   02/24/2017 1951 02/25/2017 1725 DNR [id number]  Karmen Bongo, MD Inpatient   06/12/2016 2021 06/14/2016 2149 Full Code [id number]  Vianne Bulls, MD ED   11/30/2012 2030 12/02/2012 2057 Full Code [id number]  Louellen Molder, MD Inpatient   08/15/2011 1827 08/19/2011 1821 Full Code [id number]  Kathalene Frames, MD ED   06/05/2011 1927 06/08/2011 1410 Full Code [id number]  Whitaker, Marilynn Rail, RN Inpatient      Home/SNF/Other Home  Chief Complaint Oncology Pt.  Lung Generalized Weakness  Level of Care/Admitting Diagnosis ED Disposition    ED Disposition Condition Comment   Admit  Hospital Area: [facility] [[id number]]  Level of Care: Stepdown [14]  Admit to SDU based on following criteria: Hemodynamic compromise or significant risk of instability:  Patient requiring short term acute titration and management of vasoactive drips, and invasive monitoring (i.e., CVP and Arterial line).  Diagnosis: Sepsis [facility]) [[id number]]  Admitting Physician: Toy Baker [[id number]]  Attending Physician: Toy Baker [[id number]]   Estimated length of stay: past midnight tomorrow  Certification:: I certify this patient will need inpatient services for at least 2 midnights  PT Class (Do Not Modify): Inpatient [101]  PT Acc Code (Do Not Modify): Private [1]       Medical History Past Medical History:  Diagnosis Date  . Abscess of anal and rectal regions    horse shoe abscess  . Alcoholic cirrhosis ([place])   . Arthritis    "everywhere; especially in my spine"  . Cirrhosis ([place])    STAGE 1 CIRRHOSIS PATIENT SEES DR NAMDIGAM FOR  . Colon cancer ([place]) 05/2005   TX SURGERY WITH LYMPH NODE REMOVAL  . Cough    LAST FEW WEEKS SAW DR RAMOS LUNGS MILKY SPUTUM OCC  . DM type 2 with diabetic peripheral neuropathy (H[facility])    left foot  . Fatigue   . Fistula, anal    multiple  . History of substance abuse ([place])    last alcohol was 08/2011; last marijuania was 08/2011  . Hypertension   . PTSD (post-traumatic stress disorder)    "severe" HX OF  . Sleep deprivation     Allergies Allergies  Allergen Reactions  . Morphine And Related Other (See Comments)    PATIENT IS RECOVERING FROM DRUG ADDICTION PATIENT WANTS TO AVOID ANY NARCOTICS  . Other Other (See Comments)    PATIENT IS RECOVERING FROM DRUG ADDICTION PATIENT WANTS TO AVOID ANY NARCOTICS  IV Location/Drains/Wounds Patient Lines/Drains/Airways Status   Active Line/Drains/Airways    Name:   Placement date:   Placement time:   Site:   Days:   Peripheral IV 11/08/17 Right Forearm   11/08/17    2024    Forearm   1   Peripheral IV 11/08/17 Right Hand   11/08/17    2025    Hand   1   Open Drain Rectal   02/24/17    1801    Rectal   258   Open Drain 1 Right Other (Comment)   09/05/17    0949    Other (Comment)   65   Biliary Tube Cook slip-coat 10.2 Fr. RUQ   11/07/17    1707    RUQ   2   Incision (Closed) 02/24/17 Rectum   02/24/17    1812     258          Labs/Imaging Results for orders placed or performed during the hospital encounter of 11/08/17 (from the  past 48 hour(s))  Comprehensive metabolic panel     Status: Abnormal   Collection Time: 11/08/17  8:26 PM  Result Value Ref Range   Sodium 127 (L) 135 - 145 mmol/L   Potassium 4.4 3.5 - 5.1 mmol/L   Chloride 95 (L) 98 - 111 mmol/L   CO2 20 (L) 22 - 32 mmol/L   Glucose, Bld 93 70 - 99 mg/dL   BUN 20 8 - 23 mg/dL   Creatinine, Ser 0.99 0.61 - 1.24 mg/dL   Calcium 8.7 (L) 8.9 - 10.3 mg/dL   Total Protein 7.4 6.5 - 8.1 g/dL   Albumin 2.2 (L) 3.5 - 5.0 g/dL   AST 77 (H) 15 - 41 U/L   ALT 64 (H) 0 - 44 U/L   Alkaline Phosphatase 480 (H) 38 - 126 U/L   Total Bilirubin 4.1 (H) 0.3 - 1.2 mg/dL   GFR calc non Af Amer >60 >60 mL/min   GFR calc Af Amer >60 >60 mL/min    Comment: (NOTE) The eGFR has been calculated using the CKD EPI equation. This calculation has not been validated in all clinical situations. eGFR's persistently <60 mL/min signify possible Chronic Kidney Disease.    Anion gap 12 5 - 15    Comment: Performed at [facility], [place] [address], [place], [place] [address]  CBC with Differential     Status: Abnormal   Collection Time: 11/08/17  8:26 PM  Result Value Ref Range   WBC 11.3 (H) 4.0 - 10.5 K/uL   RBC 3.07 (L) 4.22 - 5.81 MIL/uL   Hemoglobin 9.3 (L) 13.0 - 17.0 g/dL   HCT 28.7 (L) 39.0 - 52.0 %   MCV 93.5 80.0 - 100.0 fL   MCH 30.3 26.0 - 34.0 pg   MCHC 32.4 30.0 - 36.0 g/dL   RDW 17.5 (H) 11.5 - 15.5 %   Platelets 351 150 - 400 K/uL   nRBC 0.2 0.0 - 0.2 %   Neutrophils Relative % 85 %   Neutro Abs 9.6 (H) 1.7 - 7.7 K/uL   Lymphocytes Relative 6 %   Lymphs Abs 0.7 0.7 - 4.0 K/uL   Monocytes Relative 8 %   Monocytes Absolute 0.9 0.1 - 1.0 K/uL   Eosinophils Relative 0 %   Eosinophils Absolute 0.0 0.0 - 0.5 K/uL   Basophils Relative 0 %   Basophils Absolute 0.0 0.0 - 0.1 K/uL   Immature Granulocytes 1 %  Abs Immature Granulocytes 0.10 (H) 0.00 - 0.07 K/uL    Comment: Performed at [facility], [place] [address],  [place], [place] [address]  Protime-INR     Status: None   Collection Time: 11/08/17  8:26 PM  Result Value Ref Range   Prothrombin Time 14.9 11.4 - 15.2 seconds   INR 1.18     Comment: Performed at [facility], [place] [address], [place], [place] [address]  Culture, blood (Routine x 2)     Status: None (Preliminary result)   Collection Time: 11/08/17  8:26 PM  Result Value Ref Range   Specimen Description      BLOOD RIGHT HAND Performed at [place] [address] [place], [place] [address]    Special Requests      BOTTLES DRAWN AEROBIC AND ANAEROBIC Blood Culture results may not be optimal due to an excessive volume of blood received in culture bottles Performed at [facility] [address], [place], [place] [address]    Culture  Setup Time      GRAM NEGATIVE RODS AEROBIC BOTTLE ONLY CRITICAL VALUE NOTED.  VALUE IS CONSISTENT WITH PREVIOUSLY REPORTED AND CALLED VALUE. Performed at [facility], [place] [address], [place], [place] [address]    Culture PENDING    Report Status PENDING   Culture, blood (Routine x 2)     Status: None (Preliminary result)   Collection Time: 11/08/17  8:26 PM  Result Value Ref Range   Specimen Description      BLOOD RIGHT FOREARM Performed at [facility] [address], [place], [place] [address]    Special Requests      BOTTLES DRAWN AEROBIC AND ANAEROBIC Blood Culture adequate volume Performed at [facility] [address], [place], [place] [address]    Culture  Setup Time      GRAM NEGATIVE RODS IN BOTH AEROBIC AND ANAEROBIC BOTTLES CRITICAL RESULT CALLED TO, READ BACK BY AND VERIFIED WITH: [place] B [id number] FCP Performed at [facility], [place] [address] [place], [place] [address]    Culture GRAM NEGATIVE RODS    Report Status PENDING   Lipase, blood     Status: Abnormal   Collection Time: 11/08/17  8:26 PM  Result  Value Ref Range   Lipase 167 (H) 11 - 51 U/L    Comment: Performed at [facility], [place] [address], [place], [place] [address]  Blood Culture ID Panel (Reflexed)     Status: Abnormal   Collection Time: 11/08/17  8:26 PM  Result Value Ref Range   Enterococcus species NOT DETECTED NOT DETECTED   Listeria monocytogenes NOT DETECTED NOT DETECTED   Staphylococcus species NOT DETECTED NOT DETECTED   Staphylococcus aureus (BCID) NOT DETECTED NOT DETECTED   Streptococcus species NOT DETECTED NOT DETECTED   Streptococcus agalactiae NOT DETECTED NOT DETECTED   Streptococcus pneumoniae NOT DETECTED NOT DETECTED   Streptococcus pyogenes NOT DETECTED NOT DETECTED   Acinetobacter baumannii NOT DETECTED NOT DETECTED   Enterobacteriaceae species DETECTED (A) NOT DETECTED    Comment: Enterobacteriaceae represent a large family of gram-negative bacteria, not a single organism. CRITICAL RESULT CALLED TO, READ BACK BY AND VERIFIED WITH: PHARMD MICHELLE B [id number] FCP    Enterobacter cloacae complex NOT DETECTED NOT DETECTED   Escherichia coli NOT DETECTED NOT DETECTED   Klebsiella oxytoca DETECTED (A) NOT DETECTED    Comment: CRITICAL RESULT  CALLED TO, READ BACK BY AND VERIFIED WITH: PHARMD MICHELLE B [id number] FCP    Klebsiella pneumoniae NOT DETECTED NOT DETECTED   Proteus species NOT DETECTED NOT DETECTED   Serratia marcescens NOT DETECTED NOT DETECTED   Carbapenem resistance NOT DETECTED NOT DETECTED   Haemophilus influenzae NOT DETECTED NOT DETECTED   Neisseria meningitidis NOT DETECTED NOT DETECTED   Pseudomonas aeruginosa NOT DETECTED NOT DETECTED   Candida albicans NOT DETECTED NOT DETECTED   Candida glabrata NOT DETECTED NOT DETECTED   Candida krusei NOT DETECTED NOT DETECTED   Candida parapsilosis NOT DETECTED NOT DETECTED   Candida tropicalis NOT DETECTED NOT DETECTED    Comment: Performed at [facility] [address] [place], [place] [address]   Urinalysis, Routine w reflex microscopic     Status: Abnormal   Collection Time: 11/09/17 12:35 AM  Result Value Ref Range   Color, Urine AMBER (A) YELLOW    Comment: BIOCHEMICALS MAY BE AFFECTED BY COLOR   APPearance CLEAR CLEAR   Specific Gravity, Urine 1.043 (H) 1.005 - 1.030   pH 5.0 5.0 - 8.0   Glucose, UA >=500 (A) NEGATIVE mg/dL   Hgb urine dipstick NEGATIVE NEGATIVE   Bilirubin Urine NEGATIVE NEGATIVE   Ketones, ur NEGATIVE NEGATIVE mg/dL   Protein, ur NEGATIVE NEGATIVE mg/dL   Nitrite NEGATIVE NEGATIVE   Leukocytes, UA NEGATIVE NEGATIVE   RBC / HPF 0-5 0 - 5 RBC/hpf   WBC, UA 0-5 0 - 5 WBC/hpf   Bacteria, UA NONE SEEN NONE SEEN   Squamous Epithelial / LPF 0-5 0 - 5   Mucus PRESENT    Hyaline Casts, UA PRESENT     Comment: Performed at [facility], [place] [address], [place], [place] [address]  Creatinine, urine, random     Status: None   Collection Time: 11/09/17 12:35 AM  Result Value Ref Range   Creatinine, Urine 124.62 mg/dL    Comment: Performed at [facility], [place] [address], [place], [place] [address]  Sodium, urine, random     Status: None   Collection Time: 11/09/17 12:35 AM  Result Value Ref Range   Sodium, Ur 18 mmol/L    Comment: Performed at [facility], [place] [address] [place], [place] [address]  Lipase, blood     Status: Abnormal   Collection Time: 11/09/17  6:30 AM  Result Value Ref Range   Lipase 62 (H) 11 - 51 U/L    Comment: Performed at [facility], [place] [address], [place], [place] [address]  Hemoglobin A1c     Status: None   Collection Time: 11/09/17  7:52 AM  Result Value Ref Range   Hgb A1c MFr Bld 4.9 4.8 - 5.6 %    Comment: (NOTE) Pre diabetes:          5.7%-6.4% Diabetes:              >6.4% Glycemic control for   <7.0% adults with diabetes    Mean Plasma Glucose 93.93 mg/dL    Comment: Performed at [facility] [address] [place], [place]  [address]  Magnesium     Status: None   Collection Time: 11/09/17  7:52 AM  Result Value Ref Range   Magnesium 2.0 1.7 - 2.4 mg/dL    Comment: Performed at [facility], [place] [address] [place], [place] [address]  Phosphorus     Status: None   Collection Time:  11/09/17  7:52 AM  Result Value Ref Range   Phosphorus 3.3 2.5 - 4.6 mg/dL    Comment: Performed at [facility], [place] [address], [place], [place] [address]  TSH     Status: None   Collection Time: 11/09/17  7:52 AM  Result Value Ref Range   TSH 2.256 0.350 - 4.500 uIU/mL    Comment: Performed by a 3rd Generation assay with a functional sensitivity of <=0.01 uIU/mL. Performed at [facility], [place] [address], [place], [place] [address]   Comprehensive metabolic panel     Status: Abnormal   Collection Time: 11/09/17  7:52 AM  Result Value Ref Range   Sodium 131 (L) 135 - 145 mmol/L   Potassium 4.1 3.5 - 5.1 mmol/L   Chloride 101 98 - 111 mmol/L   CO2 19 (L) 22 - 32 mmol/L   Glucose, Bld 82 70 - 99 mg/dL   BUN 19 8 - 23 mg/dL   Creatinine, Ser 0.56 (L) 0.61 - 1.24 mg/dL   Calcium 8.4 (L) 8.9 - 10.3 mg/dL   Total Protein 6.8 6.5 - 8.1 g/dL   Albumin 2.1 (L) 3.5 - 5.0 g/dL   AST 60 (H) 15 - 41 U/L   ALT 50 (H) 0 - 44 U/L   Alkaline Phosphatase 367 (H) 38 - 126 U/L   Total Bilirubin 4.1 (H) 0.3 - 1.2 mg/dL   GFR calc non Af Amer >60 >60 mL/min   GFR calc Af Amer >60 >60 mL/min    Comment: (NOTE) The eGFR has been calculated using the CKD EPI equation. This calculation has not been validated in all clinical situations. eGFR's persistently <60 mL/min signify possible Chronic Kidney Disease.    Anion gap 11 5 - 15    Comment: Performed at [facility], [place] [address] [place], [place] [address]  CBG monitoring, ED     Status: None   Collection Time: 11/09/17  8:38 AM  Result Value Ref Range   Glucose-Capillary 76 70 - 99 mg/dL  CBG monitoring, ED      Status: Abnormal   Collection Time: 11/09/17 10:42 AM  Result Value Ref Range   Glucose-Capillary 116 (H) 70 - 99 mg/dL  CBG monitoring, ED     Status: None   Collection Time: 11/09/17 12:54 PM  Result Value Ref Range   Glucose-Capillary 88 70 - 99 mg/dL  POC CBG, ED     Status: Abnormal   Collection Time: 11/09/17  3:12 PM  Result Value Ref Range   Glucose-Capillary 122 (H) 70 - 99 mg/dL   Comment 1 Notify RN    Comment 2 Document in Chart   CBG monitoring, ED     Status: Abnormal   Collection Time: 11/09/17  4:06 PM  Result Value Ref Range   Glucose-Capillary 116 (H) 70 - 99 mg/dL   Comment 1 Notify RN    Comment 2 Document in Chart    Dg Chest 2 View  Result Date: 11/08/2017 CLINICAL DATA:  62 year old male with sepsis. EXAM: CHEST - 2 VIEW COMPARISON:  Chest CT dated 10/27/2017 FINDINGS: There is cardiomegaly with probable mild vascular congestion. No focal consolidation or pneumothorax. Bilateral lung consolidation/masses seen on the prior CT are suboptimally visualized on this radiograph. Possible trace right pleural effusion. No acute osseous pathology. A pigtail tube is noted in the upper abdomen likely corresponding to the percutaneous biliary drain catheter. IMPRESSION: 1. Cardiomegaly with probable mild vascular congestion. 2.  Suboptimal visualization of the pulmonary masses/nodules seen on the prior CT. 3. Probable small right pleural effusion. Electronically Signed   By: Anner Crete M.D.   On: 11/08/2017 21:22   Ct Abdomen Pelvis W Contrast  Result Date: 11/08/2017 CLINICAL DATA:  62 year old male with history of lung cancer presenting with generalized weakness and jaundice. EXAM: CT ABDOMEN AND PELVIS WITH CONTRAST TECHNIQUE: Multidetector CT imaging of the abdomen and pelvis was performed using the standard protocol following bolus administration of intravenous contrast. CONTRAST:  193m ISOVUE-300 IOPAMIDOL (ISOVUE-300) INJECTION 61% COMPARISON:  CT of the abdomen  pelvis dated 11/06/2016 FINDINGS: Lower chest: Partially visualized small right pleural effusion with associated compressive atelectasis of the right lower lobe. A partially visualized 5.1 x 3.8 cm right lung base mass and a 1.8 x 2.3 cm left lung base nodule. There is coronary vascular calcification. Small pocket of pneumoperitoneum along the lateral aspect of the liver related to biliary drain catheter placement. There is a small ascites. Hepatobiliary: Morphologic changes of cirrhosis. Multiple hepatic hypodense lesions as seen previously most consistent with metastatic disease. Ill-defined 2.1 x 5.1 cm hypodense area in the left lobe of the liver as seen previously appears to communicate with the left portal vein and concerning for a tumor thrombus or a bland thrombus within the left portal vein. A primary biliary neoplasm such as cholangiocarcinoma or metastatic disease are not excluded. There is layering sludge or small stones within the gallbladder. A percutaneous biliary drain with pigtail tip noted in similar position. No significant interval change in the dilatation of the intrahepatic biliary trees compared to the most recent prior CT. Pancreas: Unremarkable. No pancreatic ductal dilatation or surrounding inflammatory changes. Spleen: Normal in size without focal abnormality. Adrenals/Urinary Tract: Bilateral adrenal masses most consistent with metastatic disease. There is no hydronephrosis on either side. Punctate nonobstructing left renal inferior pole calculus. The visualized ureters and urinary bladder appear unremarkable. Stomach/Bowel: Surgical material noted in the region of the rectum similar to prior CT. Postsurgical changes and suture in the descending colon. There is no bowel obstruction. Mild thickened appearance of the hepatic flexure, likely reactive to cirrhosis and ascites/hepatic colopathy. Colitis is less likely. Clinical correlation is recommended. The appendix is normal.  Vascular/Lymphatic: Mild atherosclerotic calcification of the aorta. No portal venous gas. Retroperitoneal adenopathy. A 3.6 x 2.2 cm nodular density with surrounding there is no plastic reaction in the left lower abdomen (series 2, image 65) most consistent with mesenteric or serosal metastatic disease/implant. Reproductive: The prostate and seminal vesicles are grossly unremarkable. Other: Mild diffuse subcutaneous edema. Musculoskeletal: Degenerative changes of the spine. Bilateral L5 pars defects with grade 1 L5-S1 anterolisthesis. No acute osseous pathology. IMPRESSION: 1. Overall no significant interval change since the CT of 11/06/2017. Stable positioning of the percutaneous biliary drain with similar degree of biliary ductal dilatation. 2. Cirrhosis with multiple hepatic metastatic disease. Infiltrative appearing lesion in the left lobe of the liver appears to extend into the left portal vein and may represent a tumor thrombus, or bland thrombus. A primary biliary neoplasm (cholangiocarcinoma, or metastatic disease are other considerations. 3. Retroperitoneal, mesenteric, and adrenal metastatic disease. 4. Hepatic colopathy versus less likely segmental colitis of the hepatic flexure. 5. Small right pleural effusion and bilateral lung base pulmonary masses/nodules as seen previously. 6. Tiny pockets of pneumoperitoneum lateral to the liver along the percutaneous drain, decreased since the prior CT. 7. Other findings as above. Electronically Signed   By: AAnner CreteM.D.   On: 11/08/2017  22:50   Ir Exchange Biliary Drain  Result Date: 11/08/2017 INDICATION: 62 year old male with a history of previous percutaneous placement of internal external biliary drain. Drain has become withdrawn and he presents for replacement. EXAM: IMAGE GUIDED REPLACEMENT OF INTERNAL/EXTERNAL BILIARY DRAIN. MEDICATIONS: None ANESTHESIA/SEDATION: Moderate (conscious) sedation was employed during this procedure. A total of  Versed 1.0 mg and Fentanyl 25 mcg was administered intravenously. Moderate Sedation Time: 10 minutes. The patient's level of consciousness and vital signs were monitored continuously by radiology nursing throughout the procedure under my direct supervision. FLUOROSCOPY TIME:  Fluoroscopy Time: 1 minutes 54 seconds (121 mGy). COMPLICATIONS: None PROCEDURE: Informed written consent was obtained from the patient after a thorough discussion of the procedural risks, benefits and alternatives. All questions were addressed. Maximal Sterile Barrier Technique was utilized including caps, mask, sterile gowns, sterile gloves, sterile drape, hand hygiene and skin antiseptic. A timeout was performed prior to the initiation of the procedure. Patient positioned supine position on the II table. Images were stored sent to PACs. Patient is prepped and draped in the usual sterile fashion including the indwelling drain. Gentle injection of contrast confirmed the location of the existing drain. Using modified Seldinger technique, the internal external biliary drain was exchanged for a new 10 [place] drain, with the loop curled within the duodenum. Final images were stored confirming flow antegrade. Catheter was sutured in position attached to gravity drainage. Patient tolerated the procedure well and remained hemodynamically stable throughout. No complications were encountered and no significant blood loss. IMPRESSION: Status post exchange of displaced internal/external biliary drain with placement of a new 10 French tube. Signed, Dulcy Fanny. Dellia Nims, RPVI [facility] Electronically Signed   By: Corrie Mckusick D.O.   On: 11/08/2017 08:30   EKG Interpretation  Date/Time:  Saturday November 08 2017 19:44:59 EDT Ventricular Rate:  121 PR Interval:    QRS Duration: 105 QT Interval:  310 QTC Calculation: 440 R Axis:   93 Text Interpretation:  Sinus tachycardia Right axis  deviation Borderline T abnormalities, inferior leads No significant change since last tracing Confirmed by Theotis Burrow [phone]) on 11/09/2017 4:58:55 PM   Pending Labs Unresulted Labs (From admission, onward)    Start     Ordered   11/10/17 0500  CBC  Tomorrow morning,   R     11/09/17 1414   11/10/17 0500  Comprehensive metabolic panel  Tomorrow morning,   R     11/09/17 1414   11/09/17 0141  Urine Culture  Once,   R     11/09/17 0140   11/09/17 0141  Osmolality, urine  Once,   R     11/09/17 0140   11/08/17 2017  Ammonia  STAT,   STAT     11/08/17 2016   Signed and Held  CBC  Once,   R    Comments:  Call for hg <8.0    Signed and Held   Signed and Held  Lactic acid, plasma  STAT Now then every 3 hours,   STAT     Signed and Held   Signed and Held  Procalcitonin  STAT,   R     Signed and Held   Signed and Held  MRSA PCR Screening  Once,   R    Question:  Patient immune status  Answer:  Normal   Signed and Held          Vitals/Pain Today's Vitals   11/09/17 1626 11/09/17 1627  11/09/17 1645 11/09/17 1700  BP:   (!) 141/73 (!) 162/99  Pulse:   (!) 107 (!) 105  Resp:   (!) 23 (!) 26  Temp: (!) 100.9 F (38.3 C)     TempSrc: Oral     SpO2:   98% 96%  PainSc:  4       Isolation Precautions No active isolations  Medications Medications  metroNIDAZOLE (FLAGYL) IVPB 500 mg (0 mg Intravenous Stopped 11/09/17 1440)  iopamidol (ISOVUE-300) 61 % injection (  Canceled Entry 11/08/17 2130)  sodium chloride 0.9 % injection (  Canceled Entry 11/08/17 2130)  aspirin EC tablet 81 mg (81 mg Oral Given 11/09/17 1048)  insulin detemir (LEVEMIR) injection 50 Units (50 Units Subcutaneous Given 11/09/17 1044)  gabapentin (NEURONTIN) capsule 400-800 mg (400 mg Oral Given 11/09/17 1113)  insulin aspart (novoLOG) injection 0-9 Units (1 Units Subcutaneous Given 11/09/17 1519)  ceFEPIme (MAXIPIME) 2 g in sodium chloride 0.9 % 100 mL IVPB (0 g Intravenous Stopped 11/09/17 1625)  vancomycin  (VANCOCIN) IVPB 1000 mg/200 mL premix (1,000 mg Intravenous New Bag/Given 11/09/17 1624)  sodium chloride 0.9 % bolus 1,000 mL (0 mLs Intravenous Stopped 11/08/17 2102)  ibuprofen (ADVIL,MOTRIN) tablet 600 mg (600 mg Oral Given 11/08/17 2037)  sodium chloride 0.9 % bolus 1,000 mL (0 mLs Intravenous Stopped 11/08/17 2201)    And  sodium chloride 0.9 % bolus 1,000 mL (0 mLs Intravenous Stopped 11/08/17 2249)  ceFEPIme (MAXIPIME) 2 g in sodium chloride 0.9 % 100 mL IVPB ( Intravenous Stopped 11/08/17 2116)  iopamidol (ISOVUE-300) 61 % injection 100 mL (100 mLs Intravenous Contrast Given 11/08/17 2143)  vancomycin (VANCOCIN) 2,000 mg in sodium chloride 0.9 % 500 mL IVPB (0 mg Intravenous Stopped 11/09/17 0351)    Mobility walks with device

## 2017-11-09 NOTE — Progress Notes (Signed)
[facility]  PROGRESS NOTE  Matthew Lewis TGP:[id number] DOB: 06-11-1955 DOA: 11/08/2017 PCP: [name], MD   Brief HPI:   62 year old man with history of metastatic cancer with involvement of liver, cirrhosis due to alcohol abuse, [name], stage II descending colon cancer 2007 status post left hemicolectomy, diabetes mellitus came to hospital with epigastric abdominal pain and a fever of 102 F.  His bilirubin was obstructed and was recently replaced on 11/07/2017. Patient was seen by oncology, and as per last note from [name] from 11/06/2017 cancer is likely cholangiocarcinoma with liver lung and node metastasis less likely lung primary.  Per [name],  patient has incurable metastatic disease and goal of therapy is palliative to prolong his life and improve cancer related symptoms.   Subjective   This morning patient denies any complaints.   Assessment/Plan:     1. Sepsis-likely biliary source, patient presented with sepsis physiology, started on broad-spectrum antibiotics vancomycin, cefepime, Flagyl.  Blood cultures growing Klebsiella pneumoniae.  Will await for the next 24 hours before narrowing down antibiotics.  2. Elevated lipase-lipase was elevated to 167, has come down to 62.  No evidence of pancreatitis.  This is in setting of recent biliary obstruction with replacement of biliary drain recently.  3. Diabetes mellitus-continue sliding scale insulin with NovoLog.  Blood glucose is controlled.  4. Hypertension-blood pressure is soft, antihypertensive medications on hold.  5. Obstructive jaundice-bilirubin has come down slightly from prior as bleeding is not working.  LFTs are slowly improving.  Follow CMP in a.m.  6. Intrahepatic cholangiocarcinoma-patient followed by oncology as outpatient, pelvic it also has been consulted  7. Segmental colitis-seen on the CT abdomen, patient on IV antibiotics.     CBG: Recent Labs  Lab 11/07/17 1735 11/07/17 1803  11/09/17 0838 11/09/17 1042 11/09/17 1254  GLUCAP 63* 74 76 116* 88    CBC: Recent Labs  Lab 11/06/17 0904 11/08/17 2026  WBC 11.8* 11.3*  NEUTROABS 10.1* 9.6*  HGB 10.1* 9.3*  HCT 31.0* 28.7*  MCV 93.9 93.5  PLT 301 309    Basic Metabolic Panel: Recent Labs  Lab 11/06/17 0904 11/08/17 2026 11/09/17 0752  NA 131* 127* 131*  K 4.3 4.4 4.1  CL 98 95* 101  CO2 23 20* 19*  GLUCOSE 136* 93 82  BUN 10 20 19   CREATININE 0.62 0.99 0.56*  CALCIUM 9.0 8.7* 8.4*  MG  --   --  2.0  PHOS  --   --  3.3     DVT prophylaxis: SCDs  Code Status: DNR  Family Communication: No family at bedside  Disposition Plan: likely home when medically ready for discharge   Consultants:    Procedures:   Antibiotics:   Anti-infectives (From admission, onward)   Start     Dose/Rate Route Frequency Ordered Stop   11/09/17 1600  vancomycin (VANCOCIN) IVPB 1000 mg/200 mL premix     1,000 mg 200 mL/hr over 60 Minutes Intravenous Every 12 hours 11/09/17 0225     11/09/17 0600  ceFEPIme (MAXIPIME) 2 g in sodium chloride 0.9 % 100 mL IVPB     2 g 200 mL/hr over 30 Minutes Intravenous Every 8 hours 11/09/17 0110     11/09/17 0045  vancomycin (VANCOCIN) 2,000 mg in sodium chloride 0.9 % 500 mL IVPB     2,000 mg 250 mL/hr over 120 Minutes Intravenous  Once 11/09/17 0037 11/09/17 0351   11/08/17 2030  ceFEPIme (MAXIPIME) 2 g in sodium chloride 0.9 %  100 mL IVPB     2 g 200 mL/hr over 30 Minutes Intravenous  Once 11/08/17 2019 11/08/17 2116   11/08/17 2030  metroNIDAZOLE (FLAGYL) IVPB 500 mg     500 mg 100 mL/hr over 60 Minutes Intravenous Every 8 hours 11/08/17 2019         Objective   Vitals:   11/09/17 0900 11/09/17 1134 11/09/17 1200 11/09/17 1304  BP: 122/80 127/73 121/73 130/80  Pulse: (!) 101 (!) 101 (!) 102 (!) 112  Resp: 20 (!) 22 (!) 22 17  Temp:      TempSrc:      SpO2: 100% 99% 99% 100%    Intake/Output Summary (Last 24 hours) at 11/09/2017 1314 Last data filed  at 11/09/2017 1303 Gross per 24 hour  Intake 3381.58 ml  Output 3535 ml  Net -153.42 ml   There were no vitals filed for this visit.   Physical Examination:    General: Appears in no acute distress  Cardiovascular: S1-S2, regular  Respiratory: Clear to auscultation bilaterally  Abdomen: Soft, nontender, no organomegaly, biliary drain in place  Extremities: No edema of the lower extremities  Neurologic: Alert, oriented x3, no focal deficit noted     Data Reviewed: I have personally reviewed following labs and imaging studies   Recent Results (from the past 240 hour(s))  Culture, blood (Routine x 2)     Status: None (Preliminary result)   Collection Time: 11/08/17  8:26 PM  Result Value Ref Range Status   Specimen Description   Final    BLOOD RIGHT HAND Performed at [facility] [address] [place], [place] [address]    Special Requests   Final    BOTTLES DRAWN AEROBIC AND ANAEROBIC Blood Culture results may not be optimal due to an excessive volume of blood received in culture bottles Performed at [facility] [address] [place], [place] [address]    Culture  Setup Time   Final    GRAM NEGATIVE RODS AEROBIC BOTTLE ONLY CRITICAL VALUE NOTED.  VALUE IS CONSISTENT WITH PREVIOUSLY REPORTED AND CALLED VALUE. Performed at [facility], [place] [address], [place], [place] [address]    Culture PENDING  Incomplete   Report Status PENDING  Incomplete  Culture, blood (Routine x 2)     Status: None (Preliminary result)   Collection Time: 11/08/17  8:26 PM  Result Value Ref Range Status   Specimen Description   Final    BLOOD RIGHT FOREARM Performed at [facility] [address], [place], [place] [address]    Special Requests   Final    BOTTLES DRAWN AEROBIC AND ANAEROBIC Blood Culture adequate volume Performed at [facility] [address], [place], [place] [address]     Culture  Setup Time   Final    GRAM NEGATIVE RODS IN BOTH AEROBIC AND ANAEROBIC BOTTLES Organism ID to follow Performed at [facility], [place] [address], [place], [place] [address]    Culture PENDING  Incomplete   Report Status PENDING  Incomplete     Liver Function Tests: Recent Labs  Lab 11/06/17 0904 11/08/17 2026 11/09/17 0752  AST 75* 77* 60*  ALT 57* 64* 50*  ALKPHOS 467* 480* 367*  BILITOT 4.5* 4.1* 4.1*  PROT 7.5 7.4 6.8  ALBUMIN 1.9* 2.2* 2.1*   Recent Labs  Lab 11/08/17 2026 11/09/17 0630  LIPASE 167* 62*   No results for input(s): AMMONIA in  the last 168 hours.  Cardiac Enzymes: No results for input(s): CKTOTAL, CKMB, CKMBINDEX, TROPONINI in the last 168 hours. BNP (last 3 results) No results for input(s): BNP in the last 8760 hours.  ProBNP (last 3 results) No results for input(s): PROBNP in the last 8760 hours.    Studies: Dg Chest 2 View  Result Date: 11/08/2017 CLINICAL DATA:  62 year old male with sepsis. EXAM: CHEST - 2 VIEW COMPARISON:  Chest CT dated 10/27/2017 FINDINGS: There is cardiomegaly with probable mild vascular congestion. No focal consolidation or pneumothorax. Bilateral lung consolidation/masses seen on the prior CT are suboptimally visualized on this radiograph. Possible trace right pleural effusion. No acute osseous pathology. A pigtail tube is noted in the upper abdomen likely corresponding to the percutaneous biliary drain catheter. IMPRESSION: 1. Cardiomegaly with probable mild vascular congestion. 2. Suboptimal visualization of the pulmonary masses/nodules seen on the prior CT. 3. Probable small right pleural effusion. Electronically Signed   By: Anner Crete M.D.   On: 11/08/2017 21:22   Ct Abdomen Pelvis W Contrast  Result Date: 11/08/2017 CLINICAL DATA:  62 year old male with history of lung cancer presenting with generalized weakness and jaundice. EXAM: CT ABDOMEN AND PELVIS WITH CONTRAST TECHNIQUE: Multidetector CT  imaging of the abdomen and pelvis was performed using the standard protocol following bolus administration of intravenous contrast. CONTRAST:  156m ISOVUE-300 IOPAMIDOL (ISOVUE-300) INJECTION 61% COMPARISON:  CT of the abdomen pelvis dated 11/06/2016 FINDINGS: Lower chest: Partially visualized small right pleural effusion with associated compressive atelectasis of the right lower lobe. A partially visualized 5.1 x 3.8 cm right lung base mass and a 1.8 x 2.3 cm left lung base nodule. There is coronary vascular calcification. Small pocket of pneumoperitoneum along the lateral aspect of the liver related to biliary drain catheter placement. There is a small ascites. Hepatobiliary: Morphologic changes of cirrhosis. Multiple hepatic hypodense lesions as seen previously most consistent with metastatic disease. Ill-defined 2.1 x 5.1 cm hypodense area in the left lobe of the liver as seen previously appears to communicate with the left portal vein and concerning for a tumor thrombus or a bland thrombus within the left portal vein. A primary biliary neoplasm such as cholangiocarcinoma or metastatic disease are not excluded. There is layering sludge or small stones within the gallbladder. A percutaneous biliary drain with pigtail tip noted in similar position. No significant interval change in the dilatation of the intrahepatic biliary trees compared to the most recent prior CT. Pancreas: Unremarkable. No pancreatic ductal dilatation or surrounding inflammatory changes. Spleen: Normal in size without focal abnormality. Adrenals/Urinary Tract: Bilateral adrenal masses most consistent with metastatic disease. There is no hydronephrosis on either side. Punctate nonobstructing left renal inferior pole calculus. The visualized ureters and urinary bladder appear unremarkable. Stomach/Bowel: Surgical material noted in the region of the rectum similar to prior CT. Postsurgical changes and suture in the descending colon. There is no  bowel obstruction. Mild thickened appearance of the hepatic flexure, likely reactive to cirrhosis and ascites/hepatic colopathy. Colitis is less likely. Clinical correlation is recommended. The appendix is normal. Vascular/Lymphatic: Mild atherosclerotic calcification of the aorta. No portal venous gas. Retroperitoneal adenopathy. A 3.6 x 2.2 cm nodular density with surrounding there is no plastic reaction in the left lower abdomen (series 2, image 65) most consistent with mesenteric or serosal metastatic disease/implant. Reproductive: The prostate and seminal vesicles are grossly unremarkable. Other: Mild diffuse subcutaneous edema. Musculoskeletal: Degenerative changes of the spine. Bilateral L5 pars defects with grade 1 L5-S1 anterolisthesis. No acute  osseous pathology. IMPRESSION: 1. Overall no significant interval change since the CT of 11/06/2017. Stable positioning of the percutaneous biliary drain with similar degree of biliary ductal dilatation. 2. Cirrhosis with multiple hepatic metastatic disease. Infiltrative appearing lesion in the left lobe of the liver appears to extend into the left portal vein and may represent a tumor thrombus, or bland thrombus. A primary biliary neoplasm (cholangiocarcinoma, or metastatic disease are other considerations. 3. Retroperitoneal, mesenteric, and adrenal metastatic disease. 4. Hepatic colopathy versus less likely segmental colitis of the hepatic flexure. 5. Small right pleural effusion and bilateral lung base pulmonary masses/nodules as seen previously. 6. Tiny pockets of pneumoperitoneum lateral to the liver along the percutaneous drain, decreased since the prior CT. 7. Other findings as above. Electronically Signed   By: Anner Crete M.D.   On: 11/08/2017 22:50   Ir Exchange Biliary Drain  Result Date: 11/08/2017 INDICATION: 62 year old male with a history of previous percutaneous placement of internal external biliary drain. Drain has become withdrawn and he  presents for replacement. EXAM: IMAGE GUIDED REPLACEMENT OF INTERNAL/EXTERNAL BILIARY DRAIN. MEDICATIONS: None ANESTHESIA/SEDATION: Moderate (conscious) sedation was employed during this procedure. A total of Versed 1.0 mg and Fentanyl 25 mcg was administered intravenously. Moderate Sedation Time: 10 minutes. The patient's level of consciousness and vital signs were monitored continuously by radiology nursing throughout the procedure under my direct supervision. FLUOROSCOPY TIME:  Fluoroscopy Time: 1 minutes 54 seconds (121 mGy). COMPLICATIONS: None PROCEDURE: Informed written consent was obtained from the patient after a thorough discussion of the procedural risks, benefits and alternatives. All questions were addressed. Maximal Sterile Barrier Technique was utilized including caps, mask, sterile gowns, sterile gloves, sterile drape, hand hygiene and skin antiseptic. A timeout was performed prior to the initiation of the procedure. Patient positioned supine position on the II table. Images were stored sent to PACs. Patient is prepped and draped in the usual sterile fashion including the indwelling drain. Gentle injection of contrast confirmed the location of the existing drain. Using modified Seldinger technique, the internal external biliary drain was exchanged for a new 10 [place] drain, with the loop curled within the duodenum. Final images were stored confirming flow antegrade. Catheter was sutured in position attached to gravity drainage. Patient tolerated the procedure well and remained hemodynamically stable throughout. No complications were encountered and no significant blood loss. IMPRESSION: Status post exchange of displaced internal/external biliary drain with placement of a new 10 French tube. Signed, Dulcy Fanny. Dellia Nims, RPVI [facility] Electronically Signed   By: Corrie Mckusick D.O.   On: 11/08/2017 08:30    Scheduled Meds: . aspirin EC   81 mg Oral Daily  . dextrose  1 ampule Intravenous Once  . gabapentin  400-800 mg Oral BID  . insulin aspart  0-9 Units Subcutaneous Q4H  . insulin detemir  50 Units Subcutaneous q morning - 10a    Admission status: Inpatient: Based on patients clinical presentation and evaluation of above clinical data, I have made determination that patient meets Inpatient criteria at this time.  Patient is on IV antibiotics.  Time spent: 25 min  [facility] Pager [phone]. If 7PM-7AM, please contact night-coverage at [email or web address], Office  [phone]  password [email or web address]  11/09/2017, 1:14 PM  LOS: 1 day

## 2017-11-10 ENCOUNTER — Encounter (HOSPITAL_COMMUNITY): Payer: Self-pay | Admitting: *Deleted

## 2017-11-10 ENCOUNTER — Other Ambulatory Visit: Payer: Self-pay

## 2017-11-10 DIAGNOSIS — I81 Portal vein thrombosis: Secondary | ICD-10-CM

## 2017-11-10 DIAGNOSIS — K7030 Alcoholic cirrhosis of liver without ascites: Secondary | ICD-10-CM

## 2017-11-10 DIAGNOSIS — D630 Anemia in neoplastic disease: Secondary | ICD-10-CM

## 2017-11-10 DIAGNOSIS — Z515 Encounter for palliative care: Secondary | ICD-10-CM

## 2017-11-10 DIAGNOSIS — Z7189 Other specified counseling: Secondary | ICD-10-CM

## 2017-11-10 DIAGNOSIS — B961 Klebsiella pneumoniae [K. pneumoniae] as the cause of diseases classified elsewhere: Secondary | ICD-10-CM

## 2017-11-10 DIAGNOSIS — R7881 Bacteremia: Secondary | ICD-10-CM

## 2017-11-10 LAB — COMPREHENSIVE METABOLIC PANEL
ALBUMIN: 1.9 g/dL — AB (ref 3.5–5.0)
ALT: 45 U/L — ABNORMAL HIGH (ref 0–44)
ANION GAP: 9 (ref 5–15)
AST: 58 U/L — ABNORMAL HIGH (ref 15–41)
Alkaline Phosphatase: 327 U/L — ABNORMAL HIGH (ref 38–126)
BILIRUBIN TOTAL: 4.4 mg/dL — AB (ref 0.3–1.2)
BUN: 11 mg/dL (ref 8–23)
CHLORIDE: 102 mmol/L (ref 98–111)
CO2: 20 mmol/L — ABNORMAL LOW (ref 22–32)
Calcium: 7.9 mg/dL — ABNORMAL LOW (ref 8.9–10.3)
Creatinine, Ser: 0.53 mg/dL — ABNORMAL LOW (ref 0.61–1.24)
GFR calc Af Amer: >60 mL/min (ref >60)
GFR calc non Af Amer: >60 mL/min (ref >60)
GLUCOSE: 111 mg/dL — AB (ref 70–99)
POTASSIUM: 4.1 mmol/L (ref 3.5–5.1)
Sodium: 131 mmol/L — ABNORMAL LOW (ref 135–145)
TOTAL PROTEIN: 6.6 g/dL (ref 6.5–8.1)

## 2017-11-10 LAB — GLUCOSE, CAPILLARY
GLUCOSE-CAPILLARY: 108 mg/dL — AB (ref 70–99)
GLUCOSE-CAPILLARY: 92 mg/dL (ref 70–99)
Glucose-Capillary: 101 mg/dL — ABNORMAL HIGH (ref 70–99)
Glucose-Capillary: 149 mg/dL — ABNORMAL HIGH (ref 70–99)
Glucose-Capillary: 147 mg/dL — ABNORMAL HIGH (ref 70–99)
Glucose-Capillary: 117 mg/dL — ABNORMAL HIGH (ref 70–99)

## 2017-11-10 LAB — CBC
HEMATOCRIT: 28.1 % — AB (ref 39.0–52.0)
Hemoglobin: 8.6 g/dL — ABNORMAL LOW (ref 13.0–17.0)
MCH: 29.8 pg (ref 26.0–34.0)
MCHC: 30.6 g/dL (ref 30.0–36.0)
MCV: 97.2 fL (ref 80.0–100.0)
PLATELETS: 244 10*3/uL (ref 150–400)
RBC: 2.89 MIL/uL — ABNORMAL LOW (ref 4.22–5.81)
RDW: 17.3 % — AB (ref 11.5–15.5)
WBC: 5.7 10*3/uL (ref 4.0–10.5)
nRBC: 0 % (ref 0.0–0.2)

## 2017-11-10 LAB — URINE CULTURE: CULTURE: NO GROWTH

## 2017-11-10 LAB — CG4 I-STAT (LACTIC ACID): Lactic Acid, Venous: 1.81 mmol/L (ref 0.5–1.9)

## 2017-11-10 MED ORDER — SODIUM CHLORIDE 0.9 % IV SOLN: INTRAVENOUS | Status: DC | PRN

## 2017-11-10 MED ORDER — INSULIN ASPART 100 UNIT/ML ~~LOC~~ SOLN: 0.0000 [IU] | Freq: Every day | SUBCUTANEOUS | Status: DC

## 2017-11-10 MED ORDER — ADULT MULTIVITAMIN W/MINERALS CH
1.0000 | ORAL_TABLET | Freq: Every day | ORAL | Status: DC
  Administered 2017-11-10 – 2017-11-11 (×2): 1 via ORAL
  Filled 2017-11-10 (×2): qty 1

## 2017-11-10 MED ORDER — INSULIN ASPART 100 UNIT/ML ~~LOC~~ SOLN
0.0000 [IU] | Freq: Three times a day (TID) | SUBCUTANEOUS | Status: DC
  Administered 2017-11-11: 3 [IU] via SUBCUTANEOUS

## 2017-11-10 NOTE — Progress Notes (Addendum)
Nutrition Follow-up  DOCUMENTATION CODES:   Non-severe (moderate) malnutrition in context of chronic illness  INTERVENTION:  - Continue Ensure Enlive BID, each supplement provides 350 kcal and 20 grams of protein. - Will order daily multivitamin with minerals.  - Continue to encourage PO intakes.    NUTRITION DIAGNOSIS:   Moderate Malnutrition related to chronic illness, catabolic illness, cancer and cancer related treatments as evidenced by mild fat depletion, moderate fat depletion, mild muscle depletion, moderate muscle depletion.  GOAL:   Patient will meet greater than or equal to 90% of their needs  MONITOR:   PO intake, Supplement acceptance, Weight trends, Labs  REASON FOR ASSESSMENT:   Malnutrition Screening Tool  ASSESSMENT:   62 year old man with history of metastatic cancer with involvement of liver, cirrhosis due to alcohol abuse, NASH, stage 2 descending colon cancer dx in 2007 s/p L hemicolectomy, and DM. Patient presented to the ED with epigastric and abdominal pain and a fever of 102 F.   BMI indicates overweight status. Per chart review, patient consumed 75% of dinner last night (200 kcal, 8 grams of protein). He refused one bottle of Ensure this AM but accepted one this afternoon. Patient was seen by another RD about 2 weeks ago and that note was reviewed.  Patient reports that he continues to enjoy cooking when he is feeling well enough and that he mainly consumes soft items such as oatmeal, sandwiches, omelets, and fish. He ordered an omelet this AM and salmon for lunch today. Patient reports that he continues to have intermittent periods of N/V/D and abdominal pain. During those periods it is difficult for him to eat much more than a few bites at meals.   Per Dr. Lina Sayre note: sepsis likely from biliary source, elevated lipase now trending down (recent biliary obstruction with subsequent biliary drain placement at that time), obstructive jaundice with  bilirubin slowly trending down, segmental colitis per CT abd results.   Per chart review, current weight is 218 lb and weight on 10/17 was 227 lb. This indicates 9 lb weight loss (4% body weight) in the past 2 weeks; this is significant for time frame. Patient with hx of cirrhosis.   Medications reviewed; sliding scale Novolog, 50 units Levemir/day.  Labs reviewed; CBGs: 108 and 101 mg/dL today, Na: 131 mmol/L, creatinine: 0.53 mg/dL, Ca: 7.9 mg/dL, Alk Phos elevated.       NUTRITION - FOCUSED PHYSICAL EXAM:    Most Recent Value  Orbital Region  Mild depletion  Upper Arm Region  Moderate depletion  Thoracic and Lumbar Region  Unable to assess  Buccal Region  Mild depletion  Temple Region  Mild depletion  Clavicle Bone Region  Moderate depletion  Clavicle and Acromion Bone Region  Moderate depletion  Scapular Bone Region  Unable to assess  Dorsal Hand  No depletion  Patellar Region  Mild depletion  Anterior Thigh Region  Unable to assess  Posterior Calf Region  Moderate depletion  Edema (RD Assessment)  None  Hair  Reviewed  Eyes  Reviewed  Mouth  Reviewed  Skin  Reviewed  Nails  Reviewed       Diet Order:   Diet Order            Diet Carb Modified Fluid consistency: Thin; Room service appropriate? Yes  Diet effective now              EDUCATION NEEDS:   No education needs have been identified at this time  Skin:  Skin Assessment:  Reviewed RN Assessment  Last BM:  11/3  Height:   Ht Readings from Last 1 Encounters:  11/09/17 6' (1.829 m)    Weight:   Wt Readings from Last 1 Encounters:  11/09/17 98.7 kg    Ideal Body Weight:  80.91 kg  BMI:  Body mass index is 29.51 kg/m.  Estimated Nutritional Needs:   Kcal:  3926-5997 (23-25 kcal/kg)  Protein:  120-130 grams  Fluid:  >/= 2.2 L/day     Jarome Matin, MS, RD, LDN, [facility] Inpatient Clinical Dietitian Pager # [phone] After hours/weekend pager # [phone]

## 2017-11-10 NOTE — Progress Notes (Signed)
Pts family asked that we flush biliary drain like they do at home with saline.  Drain flushed with 5 cc saline, large clot flushed from tubing.  Drain began to flow better after about an hour

## 2017-11-10 NOTE — Evaluation (Signed)
Physical Therapy Evaluation Patient Details Name: Matthew Lewis MRN: [id number] DOB: 05/05/1955 Today's Date: 11/10/2017   History of Present Illness  62 year old man with history of metastatic cancer with involvement of liver, cirrhosis due to alcohol abuse, [name], stage II descending colon cancer , diabetes mellitus came to hospital 11/2/19with epigastric abdominal pain and a fever, recent placement of biliary drain.  Clinical Impression  The patient ambulated x 400' with RW. BP 123/69, HR 110, Sats 100%. The patient should progress to return home at independent level. Pt admitted with above diagnosis. Pt currently with functional limitations due to the deficits listed below (see PT Problem List). * Pt will benefit from skilled PT to increase their independence and safety with mobility to allow discharge to the venue listed below.       Follow Up Recommendations No PT follow up    Equipment Recommendations  None recommended by PT    Recommendations for Other Services       Precautions / Restrictions Precautions Precaution Comments: drain on right with leg bag      Mobility  Bed Mobility Overal bed mobility: Independent                Transfers Overall transfer level: Independent                  Ambulation/Gait Ambulation/Gait assistance: Supervision Gait Distance (Feet): 400 Feet Assistive device: Rolling walker (2 wheeled) Gait Pattern/deviations: Step-through pattern Gait velocity: wfl   General Gait Details: no issues with ambualtion/ balance  Stairs            Wheelchair Mobility    Modified Rankin (Stroke Patients Only)       Balance Overall balance assessment: No apparent balance deficits (not formally assessed)                                           Pertinent Vitals/Pain Pain Assessment: Faces Faces Pain Scale: Hurts little more Pain Location: neuropathy of hand and feet Pain Descriptors / Indicators:  Discomfort Pain Intervention(s): Monitored during session    Home Living Family/patient expects to be discharged to:: Private residence Living Arrangements: Spouse/significant other Available Help at Discharge: Family Type of Home: House Home Access: Level entry     Home Layout: One level Home Equipment: Environmental consultant - 2 wheels      Prior Function Level of Independence: Independent with assistive device(s);Independent               Hand Dominance        Extremity/Trunk Assessment        Lower Extremity Assessment Lower Extremity Assessment: Overall WFL for tasks assessed;RLE deficits/detail;LLE deficits/detail RLE Sensation: history of peripheral neuropathy LLE Sensation: history of peripheral neuropathy    Cervical / Trunk Assessment Cervical / Trunk Assessment: Normal  Communication   Communication: No difficulties  Cognition Arousal/Alertness: Awake/alert Behavior During Therapy: WFL for tasks assessed/performed Overall Cognitive Status: Within Functional Limits for tasks assessed                                        General Comments      Exercises     Assessment/Plan    PT Assessment Patient needs continued PT services  PT Problem List Decreased strength;Decreased mobility  PT Treatment Interventions DME instruction;Gait training;Functional mobility training    PT Goals (Current goals can be found in the Care Plan section)  Acute Rehab PT Goals Patient Stated Goal: to get back to cooking PT Goal Formulation: With patient Time For Goal Achievement: 11/17/17 Potential to Achieve Goals: Good    Frequency Min 2X/week   Barriers to discharge        Co-evaluation               AM-PAC PT "6 Clicks" Daily Activity  Outcome Measure Difficulty turning over in bed (including adjusting bedclothes, sheets and blankets)?: None Difficulty moving from lying on back to sitting on the side of the bed? : None Difficulty  sitting down on and standing up from a chair with arms (e.g., wheelchair, bedside commode, etc,.)?: None Help needed moving to and from a bed to chair (including a wheelchair)?: A Little Help needed walking in hospital room?: A Little Help needed climbing 3-5 steps with a railing? : A Lot 6 Click Score: 20    End of Session   Activity Tolerance: Patient tolerated treatment well Patient left: in chair;with call bell/phone within reach Nurse Communication: Mobility status PT Visit Diagnosis: Unsteadiness on feet (R26.81)    Time: 0109-3235 PT Time Calculation (min) (ACUTE ONLY): 27 min   Charges:   PT Evaluation $PT Eval Low Complexity: 1 Low PT Treatments $Gait Training: 8-22 mins        Tresa Endo PT Acute Rehabilitation Services Pager [phone] Office [phone]   Claretha Cooper 11/10/2017, 10:34 AM

## 2017-11-10 NOTE — Progress Notes (Signed)
Matthew Lewis   DOB:16-Dec-1955   [id number]   SLH#:[id number]  Oncology follow-up note  Subjective: Patient is well-known to me, was recently diagnosed with metastatic cholangiocarcinoma.  I saw him last week in my office after hospital discharge.  He was admitted for fever and sepsis of weekend. He feels better today. VS stable.    Objective:  Vitals:   11/10/17 1203 11/10/17 1526  BP:    Pulse:    Resp:    Temp: 98 F (36.7 C) 98.4 F (36.9 C)  SpO2:      Body mass index is 29.51 kg/m.  Intake/Output Summary (Last 24 hours) at 11/10/2017 1631 Last data filed at 11/10/2017 1423 Gross per 24 hour  Intake 1473.01 ml  Output 4555 ml  Net -3081.99 ml     Sclerae unicteric  Oropharynx clear  No peripheral adenopathy  Lungs clear -- no rales or rhonchi  Heart regular rate and rhythm  Abdomen soft, (+) percutaneous biliary drain tube on right side  MSK no focal spinal tenderness, no peripheral edema  Neuro nonfocal    CBG (last 3)  Recent Labs    11/10/17 0723 11/10/17 1113 11/10/17 1517  GLUCAP 101* 149* 147*     Labs:  Lab Results  Component Value Date   WBC 5.7 11/10/2017   HGB 8.6 (L) 11/10/2017   HCT 28.1 (L) 11/10/2017   MCV 97.2 11/10/2017   PLT 244 11/10/2017   NEUTROABS 9.6 (H) 11/08/2017   CMP Latest Ref Rng & Units 11/10/2017 11/09/2017 11/08/2017  Glucose 70 - 99 mg/dL 111(H) 82 93  BUN 8 - 23 mg/dL 11 19 20   Creatinine 0.61 - 1.24 mg/dL 0.53(L) 0.56(L) 0.99  Sodium 135 - 145 mmol/L 131(L) 131(L) 127(L)  Potassium 3.5 - 5.1 mmol/L 4.1 4.1 4.4  Chloride 98 - 111 mmol/L 102 101 95(L)  CO2 22 - 32 mmol/L 20(L) 19(L) 20(L)  Calcium 8.9 - 10.3 mg/dL 7.9(L) 8.4(L) 8.7(L)  Total Protein 6.5 - 8.1 g/dL 6.6 6.8 7.4  Total Bilirubin 0.3 - 1.2 mg/dL 4.4(H) 4.1(H) 4.1(H)  Alkaline Phos 38 - 126 U/L 327(H) 367(H) 480(H)  AST 15 - 41 U/L 58(H) 60(H) 77(H)  ALT 0 - 44 U/L 45(H) 50(H) 64(H)     Urine Studies No results for input(s): UHGB, CRYS in the  last 72 hours.  Invalid input(s): UACOL, UAPR, USPG, UPH, UTP, UGL, [place], UBIL, UNIT, UROB, [place], UEPI, UWBC, [place], [place]  Basic Metabolic Panel: Recent Labs  Lab 11/06/17 0904 11/08/17 2026 11/09/17 0752 11/10/17 0327  NA 131* 127* 131* 131*  K 4.3 4.4 4.1 4.1  CL 98 95* 101 102  CO2 23 20* 19* 20*  GLUCOSE 136* 93 82 111*  BUN 10 20 19 11   CREATININE 0.62 0.99 0.56* 0.53*  CALCIUM 9.0 8.7* 8.4* 7.9*  MG  --   --  2.0  --   PHOS  --   --  3.3  --    GFR Estimated Creatinine Clearance: 113.5 mL/min (A) (by C-G formula based on SCr of 0.53 mg/dL (L)). Liver Function Tests: Recent Labs  Lab 11/06/17 0904 11/08/17 2026 11/09/17 0752 11/10/17 0327  AST 75* 77* 60* 58*  ALT 57* 64* 50* 45*  ALKPHOS 467* 480* 367* 327*  BILITOT 4.5* 4.1* 4.1* 4.4*  PROT 7.5 7.4 6.8 6.6  ALBUMIN 1.9* 2.2* 2.1* 1.9*   Recent Labs  Lab 11/08/17 2026 11/09/17 0630  LIPASE 167* 62*   No results for  input(s): AMMONIA in the last 168 hours. Coagulation profile Recent Labs  Lab 11/08/17 2026  INR 1.18    CBC: Recent Labs  Lab 11/06/17 0904 11/08/17 2026 11/09/17 1837 11/10/17 0327  WBC 11.8* 11.3* 7.2 5.7  NEUTROABS 10.1* 9.6*  --   --   HGB 10.1* 9.3* 9.5* 8.6*  HCT 31.0* 28.7* 30.5* 28.1*  MCV 93.9 93.5 96.8 97.2  PLT 301 351 284 244   Cardiac Enzymes: No results for input(s): CKTOTAL, CKMB, CKMBINDEX, TROPONINI in the last 168 hours. BNP: Invalid input(s): POCBNP CBG: Recent Labs  Lab 11/09/17 2308 11/10/17 0339 11/10/17 0723 11/10/17 1113 11/10/17 1517  GLUCAP 116* 108* 101* 149* 147*   D-Dimer No results for input(s): DDIMER in the last 72 hours. Hgb A1c Recent Labs    11/09/17 0752  HGBA1C 4.9   Lipid Profile No results for input(s): CHOL, HDL, LDLCALC, TRIG, CHOLHDL, LDLDIRECT in the last 72 hours. Thyroid function studies Recent Labs    11/09/17 0752  TSH 2.256   Anemia work up No results for input(s): VITAMINB12, FOLATE,  FERRITIN, TIBC, IRON, RETICCTPCT in the last 72 hours. Microbiology Recent Results (from the past 240 hour(s))  Culture, blood (Routine x 2)     Status: Abnormal (Preliminary result)   Collection Time: 11/08/17  8:26 PM  Result Value Ref Range Status   Specimen Description   Final    BLOOD RIGHT HAND Performed at [facility], [place] [address], [place], [place] [address]    Special Requests   Final    BOTTLES DRAWN AEROBIC AND ANAEROBIC Blood Culture results may not be optimal due to an excessive volume of blood received in culture bottles Performed at [facility] [address] [place], [place] [address]    Culture  Setup Time   Final    GRAM NEGATIVE RODS AEROBIC BOTTLE ONLY CRITICAL VALUE NOTED.  VALUE IS CONSISTENT WITH PREVIOUSLY REPORTED AND CALLED VALUE. Performed at [facility], [place] [address] [place], [place] [address]    Culture KLEBSIELLA OXYTOCA (A)  Final   Report Status PENDING  Incomplete  Culture, blood (Routine x 2)     Status: Abnormal (Preliminary result)   Collection Time: 11/08/17  8:26 PM  Result Value Ref Range Status   Specimen Description   Final    BLOOD RIGHT FOREARM Performed at [place] [address], [place], [place] [address]    Special Requests   Final    BOTTLES DRAWN AEROBIC AND ANAEROBIC Blood Culture adequate volume Performed at [facility] [address] [place], [place] [address]    Culture  Setup Time   Final    GRAM NEGATIVE RODS IN BOTH AEROBIC AND ANAEROBIC BOTTLES CRITICAL RESULT CALLED TO, READ BACK BY AND VERIFIED WITH: PHARMD MICHELLE B [id number] FCP    Culture (A)  Final    KLEBSIELLA OXYTOCA SUSCEPTIBILITIES TO FOLLOW Performed at [facility], [place] [address] [place],  [address]    Report Status PENDING  Incomplete  Blood Culture ID Panel (Reflexed)     Status: Abnormal   Collection Time: 11/08/17  8:26 PM  Result  Value Ref Range Status   Enterococcus species NOT DETECTED NOT DETECTED Final   Listeria monocytogenes NOT DETECTED NOT DETECTED Final   Staphylococcus species NOT DETECTED NOT DETECTED Final   Staphylococcus aureus (BCID) NOT DETECTED NOT DETECTED Final   Streptococcus species NOT DETECTED NOT DETECTED Final  Streptococcus agalactiae NOT DETECTED NOT DETECTED Final   Streptococcus pneumoniae NOT DETECTED NOT DETECTED Final   Streptococcus pyogenes NOT DETECTED NOT DETECTED Final   Acinetobacter baumannii NOT DETECTED NOT DETECTED Final   Enterobacteriaceae species DETECTED (A) NOT DETECTED Final    Comment: Enterobacteriaceae represent a large family of gram-negative bacteria, not a single organism. CRITICAL RESULT CALLED TO, READ BACK BY AND VERIFIED WITH: PHARMD [place] B [id number] FCP    Enterobacter cloacae complex NOT DETECTED NOT DETECTED Final   Escherichia coli NOT DETECTED NOT DETECTED Final   Klebsiella oxytoca DETECTED (A) NOT DETECTED Final    Comment: CRITICAL RESULT CALLED TO, READ BACK BY AND VERIFIED WITH: PHARMD MICHELLE B [id number] FCP    Klebsiella pneumoniae NOT DETECTED NOT DETECTED Final   Proteus species NOT DETECTED NOT DETECTED Final   Serratia marcescens NOT DETECTED NOT DETECTED Final   Carbapenem resistance NOT DETECTED NOT DETECTED Final   Haemophilus influenzae NOT DETECTED NOT DETECTED Final   Neisseria meningitidis NOT DETECTED NOT DETECTED Final   Pseudomonas aeruginosa NOT DETECTED NOT DETECTED Final   Candida albicans NOT DETECTED NOT DETECTED Final   Candida glabrata NOT DETECTED NOT DETECTED Final   Candida krusei NOT DETECTED NOT DETECTED Final   Candida parapsilosis NOT DETECTED NOT DETECTED Final   Candida tropicalis NOT DETECTED NOT DETECTED Final    Comment: Performed at [facility], [place] [address], [place], [place] [address]  Urine Culture     Status: None   Collection Time: 11/09/17 12:35 AM  Result Value Ref Range Status    Specimen Description   Final    URINE, CLEAN CATCH Performed at [facility], [place] [address] [place], [place] [address]    Special Requests   Final    NONE Performed at [facility], [place] [address] [place], [place] [address]    Culture   Final    NO GROWTH Performed at [facility], [place] [address] [place], [place] [address]    Report Status 11/10/2017 FINAL  Final  MRSA PCR Screening     Status: None   Collection Time: 11/09/17  5:38 PM  Result Value Ref Range Status   MRSA by PCR NEGATIVE NEGATIVE Final    Comment:        The GeneXpert MRSA Assay (FDA approved for NASAL specimens only), is one component of a comprehensive MRSA colonization surveillance program. It is not intended to diagnose MRSA infection nor to guide or monitor treatment for MRSA infections. Performed at [facility], [place] [address], [place], [place] [address]       Studies:  Dg Chest 2 View  Result Date: 11/08/2017 CLINICAL DATA:  62 year old male with sepsis. EXAM: CHEST - 2 VIEW COMPARISON:  Chest CT dated 10/27/2017 FINDINGS: There is cardiomegaly with probable mild vascular congestion. No focal consolidation or pneumothorax. Bilateral lung consolidation/masses seen on the prior CT are suboptimally visualized on this radiograph. Possible trace right pleural effusion. No acute osseous pathology. A pigtail tube is noted in the upper abdomen likely corresponding to the percutaneous biliary drain catheter. IMPRESSION: 1. Cardiomegaly with probable mild vascular congestion. 2. Suboptimal visualization of the pulmonary masses/nodules seen on the prior CT. 3. Probable small right pleural effusion. Electronically Signed   By: Anner Crete M.D.   On: 11/08/2017 21:22   Ct Abdomen Pelvis W Contrast  Result Date: 11/08/2017 CLINICAL DATA:  62 year old male with history of lung cancer  presenting with generalized weakness and jaundice. EXAM: CT  ABDOMEN AND PELVIS WITH CONTRAST TECHNIQUE: Multidetector CT imaging of the abdomen and pelvis was performed using the standard protocol following bolus administration of intravenous contrast. CONTRAST:  150m ISOVUE-300 IOPAMIDOL (ISOVUE-300) INJECTION 61% COMPARISON:  CT of the abdomen pelvis dated 11/06/2016 FINDINGS: Lower chest: Partially visualized small right pleural effusion with associated compressive atelectasis of the right lower lobe. A partially visualized 5.1 x 3.8 cm right lung base mass and a 1.8 x 2.3 cm left lung base nodule. There is coronary vascular calcification. Small pocket of pneumoperitoneum along the lateral aspect of the liver related to biliary drain catheter placement. There is a small ascites. Hepatobiliary: Morphologic changes of cirrhosis. Multiple hepatic hypodense lesions as seen previously most consistent with metastatic disease. Ill-defined 2.1 x 5.1 cm hypodense area in the left lobe of the liver as seen previously appears to communicate with the left portal vein and concerning for a tumor thrombus or a bland thrombus within the left portal vein. A primary biliary neoplasm such as cholangiocarcinoma or metastatic disease are not excluded. There is layering sludge or small stones within the gallbladder. A percutaneous biliary drain with pigtail tip noted in similar position. No significant interval change in the dilatation of the intrahepatic biliary trees compared to the most recent prior CT. Pancreas: Unremarkable. No pancreatic ductal dilatation or surrounding inflammatory changes. Spleen: Normal in size without focal abnormality. Adrenals/Urinary Tract: Bilateral adrenal masses most consistent with metastatic disease. There is no hydronephrosis on either side. Punctate nonobstructing left renal inferior pole calculus. The visualized ureters and urinary bladder appear unremarkable. Stomach/Bowel: Surgical material noted in the region of the rectum similar to prior CT.  Postsurgical changes and suture in the descending colon. There is no bowel obstruction. Mild thickened appearance of the hepatic flexure, likely reactive to cirrhosis and ascites/hepatic colopathy. Colitis is less likely. Clinical correlation is recommended. The appendix is normal. Vascular/Lymphatic: Mild atherosclerotic calcification of the aorta. No portal venous gas. Retroperitoneal adenopathy. A 3.6 x 2.2 cm nodular density with surrounding there is no plastic reaction in the left lower abdomen (series 2, image 65) most consistent with mesenteric or serosal metastatic disease/implant. Reproductive: The prostate and seminal vesicles are grossly unremarkable. Other: Mild diffuse subcutaneous edema. Musculoskeletal: Degenerative changes of the spine. Bilateral L5 pars defects with grade 1 L5-S1 anterolisthesis. No acute osseous pathology. IMPRESSION: 1. Overall no significant interval change since the CT of 11/06/2017. Stable positioning of the percutaneous biliary drain with similar degree of biliary ductal dilatation. 2. Cirrhosis with multiple hepatic metastatic disease. Infiltrative appearing lesion in the left lobe of the liver appears to extend into the left portal vein and may represent a tumor thrombus, or bland thrombus. A primary biliary neoplasm (cholangiocarcinoma, or metastatic disease are other considerations. 3. Retroperitoneal, mesenteric, and adrenal metastatic disease. 4. Hepatic colopathy versus less likely segmental colitis of the hepatic flexure. 5. Small right pleural effusion and bilateral lung base pulmonary masses/nodules as seen previously. 6. Tiny pockets of pneumoperitoneum lateral to the liver along the percutaneous drain, decreased since the prior CT. 7. Other findings as above. Electronically Signed   By: AAnner CreteM.D.   On: 11/08/2017 22:50    Assessment: 62y.o. male with past medical history of liver cirrhosis, recently diagnosed cholangio-carcinoma with obstructive  jaundice  1.  Klebsiella bacteremia and sepsis 2.  Newly diagnosed metastatic choriocarcinoma to lungs, adrenal glands and nodes  3.  Obstructive jaundice, status post percutaneous biliary drainage 4.  Liver cirrhosis secondary to alcohol and [place] 5.  Portal vein thrombosis 6. DM 7.HTN 8.  Malnutrition and deconditioning 9. Anemia in malignant disease  10. Hyponatremia   Plan:  -I agree with iv antibiotics, which is managed by his primary team.  -His overall prognosis is guarded, due to the diffusely metastatic, and comorbidities, especially liver cirrhosis -his cancer treatment is also limited by his poor performance status, and medical comorbidities.  If he is able to recover well from the episode of sepsis, we will only try chemo.  However if he is not able to recover well enough (to have PS 2 or better), he may not be a candidate for chemo. Pt is realistic and understands the goal of his cancer care is palliative -appreciate palliative care team's input. Code status is DNR -I will f/u along. Will cancer his chemo class and PET scan which is scheduled for his Friday, if he remains to be in house by then     [name], MD 11/10/2017  4:31 PM

## 2017-11-10 NOTE — Progress Notes (Signed)
OT Cancellation Note  Patient Details Name: EMIL WEIGOLD MRN: [id number] DOB: [birth year]   Cancelled Treatment:    Reason Eval/Treat Not Completed: OT screened, no needs identified, will sign off  [place], Thereasa Parkin 11/10/2017, 4:01 PM

## 2017-11-10 NOTE — Progress Notes (Signed)
[facility]  PROGRESS NOTE  Matthew Lewis TDS:[id number] DOB: [birth year] DOA: 11/08/2017 PCP: [name], MD   Brief HPI:   62 year old man with history of metastatic cancer with involvement of liver, cirrhosis due to alcohol abuse, [name], stage II descending colon cancer 2007 status post left hemicolectomy, diabetes mellitus came to hospital with epigastric abdominal pain and a fever of 102 F.  His bilirubin was obstructed and was recently replaced on 11/07/2017. Patient was seen by oncology, and as per last note from [name] from 11/06/2017 cancer is likely cholangiocarcinoma with liver lung and node metastasis less likely lung primary.  Per [name],  patient has incurable metastatic disease and goal of therapy is palliative to prolong his life and improve cancer related symptoms.   Subjective   Patient seen and examined, denies any new complaints.   Assessment/Plan:     1. Sepsis-likely biliary source, patient presented with sepsis physiology, started on broad-spectrum antibiotics vancomycin, cefepime, Flagyl.  Blood cultures growing Klebsiella oxytoca, patient was started on vancomycin and cefepime.  Will discontinue vancomycin and continue with cefepime at this time.  2. Elevated lipase-lipase was elevated to 167, has come down to 62.  No evidence of pancreatitis.  This is in setting of recent biliary obstruction with replacement of biliary drain recently.  3. Diabetes mellitus-continue sliding scale insulin with NovoLog.  Blood glucose is controlled.  4. Hypertension-blood pressure is soft, antihypertensive medications on hold.  5. Obstructive jaundice-bilirubin has come down slightly from prior as bleeding is not working.  LFTs are slowly improving.  Follow CMP in a.m.  6. Intrahepatic cholangiocarcinoma-patient followed by oncology as outpatient  7. Segmental colitis-seen on the CT abdomen, patient on IV antibiotics.     CBG: Recent Labs  Lab  11/09/17 1917 11/09/17 2308 11/10/17 0339 11/10/17 0723 11/10/17 1113  GLUCAP 139* 116* 108* 101* 149*    CBC: Recent Labs  Lab 11/06/17 0904 11/08/17 2026 11/09/17 1837 11/10/17 0327  WBC 11.8* 11.3* 7.2 5.7  NEUTROABS 10.1* 9.6*  --   --   HGB 10.1* 9.3* 9.5* 8.6*  HCT 31.0* 28.7* 30.5* 28.1*  MCV 93.9 93.5 96.8 97.2  PLT 301 351 284 262    Basic Metabolic Panel: Recent Labs  Lab 11/06/17 0904 11/08/17 2026 11/09/17 0752 11/10/17 0327  NA 131* 127* 131* 131*  K 4.3 4.4 4.1 4.1  CL 98 95* 101 102  CO2 23 20* 19* 20*  GLUCOSE 136* 93 82 111*  BUN 10 20 19 11   CREATININE 0.62 0.99 0.56* 0.53*  CALCIUM 9.0 8.7* 8.4* 7.9*  MG  --   --  2.0  --   PHOS  --   --  3.3  --      DVT prophylaxis: SCDs  Code Status: DNR  Family Communication: No family at bedside  Disposition Plan: likely home when medically ready for discharge   Consultants:    Procedures:   Antibiotics:   Anti-infectives (From admission, onward)   Start     Dose/Rate Route Frequency Ordered Stop   11/09/17 1600  vancomycin (VANCOCIN) IVPB 1000 mg/200 mL premix  Status:  Discontinued     1,000 mg 200 mL/hr over 60 Minutes Intravenous Every 12 hours 11/09/17 0225 11/10/17 1326   11/09/17 0600  ceFEPIme (MAXIPIME) 2 g in sodium chloride 0.9 % 100 mL IVPB     2 g 200 mL/hr over 30 Minutes Intravenous Every 8 hours 11/09/17 0110     11/09/17 0045  vancomycin (  VANCOCIN) 2,000 mg in sodium chloride 0.9 % 500 mL IVPB     2,000 mg 250 mL/hr over 120 Minutes Intravenous  Once 11/09/17 0037 11/09/17 0351   11/08/17 2030  ceFEPIme (MAXIPIME) 2 g in sodium chloride 0.9 % 100 mL IVPB     2 g 200 mL/hr over 30 Minutes Intravenous  Once 11/08/17 2019 11/08/17 2116   11/08/17 2030  metroNIDAZOLE (FLAGYL) IVPB 500 mg     500 mg 100 mL/hr over 60 Minutes Intravenous Every 8 hours 11/08/17 2019         Objective   Vitals:   11/10/17 0800 11/10/17 1016 11/10/17 1132 11/10/17 1203  BP: (!)  102/55 123/69 110/62   Pulse: 95 98 91   Resp: 18 (!) 21 16   Temp: 98.4 F (36.9 C)   98 F (36.7 C)  TempSrc: Oral   Oral  SpO2: 98% 100% 99%   Weight:      Height:        Intake/Output Summary (Last 24 hours) at 11/10/2017 1328 Last data filed at 11/10/2017 0725 Gross per 24 hour  Intake 1463.01 ml  Output 3680 ml  Net -2216.99 ml   Filed Weights   11/09/17 1738  Weight: 98.7 kg     Physical Examination:   Neck: Supple, no deformities, masses, or tenderness Lungs: Normal respiratory effort, bilateral clear to auscultation, no crackles or wheezes.  Heart: Regular rate and rhythm, S1 and S2 normal, no murmurs, rubs auscultated Abdomen: BS normoactive,soft,nondistended,non-tender to palpation,no organomegaly Extremities: No pretibial edema, no erythema, no cyanosis, no clubbing Neuro : Alert and oriented to time, place and person, No focal deficits Skin-biliary drain in place    Data Reviewed: I have personally reviewed following labs and imaging studies   Recent Results (from the past 240 hour(s))  Culture, blood (Routine x 2)     Status: Abnormal (Preliminary result)   Collection Time: 11/08/17  8:26 PM  Result Value Ref Range Status   Specimen Description   Final    BLOOD RIGHT HAND Performed at [facility] [address] [place], [place] [address]    Special Requests   Final    BOTTLES DRAWN AEROBIC AND ANAEROBIC Blood Culture results may not be optimal due to an excessive volume of blood received in culture bottles Performed at [facility] [address] [place], [place] [address]    Culture  Setup Time   Final    GRAM NEGATIVE RODS AEROBIC BOTTLE ONLY CRITICAL VALUE NOTED.  VALUE IS CONSISTENT WITH PREVIOUSLY REPORTED AND CALLED VALUE. Performed at [facility], [place] [address], [place], [place] [address]    Culture KLEBSIELLA OXYTOCA (A)  Final   Report Status PENDING  Incomplete  Culture, blood  (Routine x 2)     Status: Abnormal (Preliminary result)   Collection Time: 11/08/17  8:26 PM  Result Value Ref Range Status   Specimen Description   Final    BLOOD RIGHT FOREARM Performed at [facility] [address], [place], [place] [address]    Special Requests   Final    BOTTLES DRAWN AEROBIC AND ANAEROBIC Blood Culture adequate volume Performed at [facility] [address] [place], [place] [address]    Culture  Setup Time   Final    GRAM NEGATIVE RODS IN BOTH AEROBIC AND ANAEROBIC BOTTLES CRITICAL RESULT CALLED TO, READ BACK BY AND VERIFIED WITH: PHARMD MICHELLE B 1351 [id number] FCP  Culture (A)  Final    KLEBSIELLA OXYTOCA SUSCEPTIBILITIES TO FOLLOW Performed at [facility], [place] [address] [place], [place] [address]    Report Status PENDING  Incomplete  Blood Culture ID Panel (Reflexed)     Status: Abnormal   Collection Time: 11/08/17  8:26 PM  Result Value Ref Range Status   Enterococcus species NOT DETECTED NOT DETECTED Final   Listeria monocytogenes NOT DETECTED NOT DETECTED Final   Staphylococcus species NOT DETECTED NOT DETECTED Final   Staphylococcus aureus (BCID) NOT DETECTED NOT DETECTED Final   Streptococcus species NOT DETECTED NOT DETECTED Final   Streptococcus agalactiae NOT DETECTED NOT DETECTED Final   Streptococcus pneumoniae NOT DETECTED NOT DETECTED Final   Streptococcus pyogenes NOT DETECTED NOT DETECTED Final   Acinetobacter baumannii NOT DETECTED NOT DETECTED Final   Enterobacteriaceae species DETECTED (A) NOT DETECTED Final    Comment: Enterobacteriaceae represent a large family of gram-negative bacteria, not a single organism. CRITICAL RESULT CALLED TO, READ BACK BY AND VERIFIED WITH: PHARMD [place] B 1351 [id number] FCP    Enterobacter cloacae complex NOT DETECTED NOT DETECTED Final   Escherichia coli NOT DETECTED NOT DETECTED Final   Klebsiella oxytoca DETECTED (A) NOT DETECTED Final    Comment:  CRITICAL RESULT CALLED TO, READ BACK BY AND VERIFIED WITH: PHARMD MICHELLE B 1351 [id number] FCP    Klebsiella pneumoniae NOT DETECTED NOT DETECTED Final   Proteus species NOT DETECTED NOT DETECTED Final   Serratia marcescens NOT DETECTED NOT DETECTED Final   Carbapenem resistance NOT DETECTED NOT DETECTED Final   Haemophilus influenzae NOT DETECTED NOT DETECTED Final   Neisseria meningitidis NOT DETECTED NOT DETECTED Final   Pseudomonas aeruginosa NOT DETECTED NOT DETECTED Final   Candida albicans NOT DETECTED NOT DETECTED Final   Candida glabrata NOT DETECTED NOT DETECTED Final   Candida krusei NOT DETECTED NOT DETECTED Final   Candida parapsilosis NOT DETECTED NOT DETECTED Final   Candida tropicalis NOT DETECTED NOT DETECTED Final    Comment: Performed at [facility], [place] [address], [place], [place] [address]  MRSA PCR Screening     Status: None   Collection Time: 11/09/17  5:38 PM  Result Value Ref Range Status   MRSA by PCR NEGATIVE NEGATIVE Final    Comment:        The GeneXpert MRSA Assay (FDA approved for NASAL specimens only), is one component of a comprehensive MRSA colonization surveillance program. It is not intended to diagnose MRSA infection nor to guide or monitor treatment for MRSA infections. Performed at [facility], [place] [address] [place], [place] [address]      Liver Function Tests: Recent Labs  Lab 11/06/17 0904 11/08/17 2026 11/09/17 0752 11/10/17 0327  AST 75* 77* 60* 58*  ALT 57* 64* 50* 45*  ALKPHOS 467* 480* 367* 327*  BILITOT 4.5* 4.1* 4.1* 4.4*  PROT 7.5 7.4 6.8 6.6  ALBUMIN 1.9* 2.2* 2.1* 1.9*   Recent Labs  Lab 11/08/17 2026 11/09/17 0630  LIPASE 167* 62*   No results for input(s): AMMONIA in the last 168 hours.  Cardiac Enzymes: No results for input(s): CKTOTAL, CKMB, CKMBINDEX, TROPONINI in the last 168 hours. BNP (last 3 results) No results for input(s): BNP in the last 8760 hours.  ProBNP (last 3  results) No results for input(s): PROBNP in the last 8760 hours.    Studies: Dg Chest 2 View  Result Date: 11/08/2017 CLINICAL DATA:  62 year old male with sepsis. EXAM: CHEST -  2 VIEW COMPARISON:  Chest CT dated 10/27/2017 FINDINGS: There is cardiomegaly with probable mild vascular congestion. No focal consolidation or pneumothorax. Bilateral lung consolidation/masses seen on the prior CT are suboptimally visualized on this radiograph. Possible trace right pleural effusion. No acute osseous pathology. A pigtail tube is noted in the upper abdomen likely corresponding to the percutaneous biliary drain catheter. IMPRESSION: 1. Cardiomegaly with probable mild vascular congestion. 2. Suboptimal visualization of the pulmonary masses/nodules seen on the prior CT. 3. Probable small right pleural effusion. Electronically Signed   By: Anner Crete M.D.   On: 11/08/2017 21:22   Ct Abdomen Pelvis W Contrast  Result Date: 11/08/2017 CLINICAL DATA:  62 year old male with history of lung cancer presenting with generalized weakness and jaundice. EXAM: CT ABDOMEN AND PELVIS WITH CONTRAST TECHNIQUE: Multidetector CT imaging of the abdomen and pelvis was performed using the standard protocol following bolus administration of intravenous contrast. CONTRAST:  19m ISOVUE-300 IOPAMIDOL (ISOVUE-300) INJECTION 61% COMPARISON:  CT of the abdomen pelvis dated 11/06/2016 FINDINGS: Lower chest: Partially visualized small right pleural effusion with associated compressive atelectasis of the right lower lobe. A partially visualized 5.1 x 3.8 cm right lung base mass and a 1.8 x 2.3 cm left lung base nodule. There is coronary vascular calcification. Small pocket of pneumoperitoneum along the lateral aspect of the liver related to biliary drain catheter placement. There is a small ascites. Hepatobiliary: Morphologic changes of cirrhosis. Multiple hepatic hypodense lesions as seen previously most consistent with metastatic disease.  Ill-defined 2.1 x 5.1 cm hypodense area in the left lobe of the liver as seen previously appears to communicate with the left portal vein and concerning for a tumor thrombus or a bland thrombus within the left portal vein. A primary biliary neoplasm such as cholangiocarcinoma or metastatic disease are not excluded. There is layering sludge or small stones within the gallbladder. A percutaneous biliary drain with pigtail tip noted in similar position. No significant interval change in the dilatation of the intrahepatic biliary trees compared to the most recent prior CT. Pancreas: Unremarkable. No pancreatic ductal dilatation or surrounding inflammatory changes. Spleen: Normal in size without focal abnormality. Adrenals/Urinary Tract: Bilateral adrenal masses most consistent with metastatic disease. There is no hydronephrosis on either side. Punctate nonobstructing left renal inferior pole calculus. The visualized ureters and urinary bladder appear unremarkable. Stomach/Bowel: Surgical material noted in the region of the rectum similar to prior CT. Postsurgical changes and suture in the descending colon. There is no bowel obstruction. Mild thickened appearance of the hepatic flexure, likely reactive to cirrhosis and ascites/hepatic colopathy. Colitis is less likely. Clinical correlation is recommended. The appendix is normal. Vascular/Lymphatic: Mild atherosclerotic calcification of the aorta. No portal venous gas. Retroperitoneal adenopathy. A 3.6 x 2.2 cm nodular density with surrounding there is no plastic reaction in the left lower abdomen (series 2, image 65) most consistent with mesenteric or serosal metastatic disease/implant. Reproductive: The prostate and seminal vesicles are grossly unremarkable. Other: Mild diffuse subcutaneous edema. Musculoskeletal: Degenerative changes of the spine. Bilateral L5 pars defects with grade 1 L5-S1 anterolisthesis. No acute osseous pathology. IMPRESSION: 1. Overall no  significant interval change since the CT of 11/06/2017. Stable positioning of the percutaneous biliary drain with similar degree of biliary ductal dilatation. 2. Cirrhosis with multiple hepatic metastatic disease. Infiltrative appearing lesion in the left lobe of the liver appears to extend into the left portal vein and may represent a tumor thrombus, or bland thrombus. A primary biliary neoplasm (cholangiocarcinoma, or metastatic disease are  other considerations. 3. Retroperitoneal, mesenteric, and adrenal metastatic disease. 4. Hepatic colopathy versus less likely segmental colitis of the hepatic flexure. 5. Small right pleural effusion and bilateral lung base pulmonary masses/nodules as seen previously. 6. Tiny pockets of pneumoperitoneum lateral to the liver along the percutaneous drain, decreased since the prior CT. 7. Other findings as above. Electronically Signed   By: Anner Crete M.D.   On: 11/08/2017 22:50    Scheduled Meds: . aspirin EC  81 mg Oral Daily  . atorvastatin  20 mg Oral q1800  . enoxaparin (LOVENOX) injection  40 mg Subcutaneous Q24H  . feeding supplement (ENSURE ENLIVE)  237 mL Oral BID BM  . gabapentin  400 mg Oral Daily  . gabapentin  800 mg Oral QHS  . insulin aspart  0-9 Units Subcutaneous Q4H  . insulin detemir  50 Units Subcutaneous q morning - 10a    Admission status: Inpatient: Based on patients clinical presentation and evaluation of above clinical data, I have made determination that patient meets Inpatient criteria at this time.  Patient is on IV antibiotics.  Time spent: 25 min  [facility] Pager [phone]. If 7PM-7AM, please contact night-coverage at [email or web address], Office  [phone]  password [email or web address]  11/10/2017, 1:28 PM  LOS: 2 days

## 2017-11-10 NOTE — Consult Note (Signed)
Consultation Note Date: 11/10/2017   Patient Name: Matthew Lewis  DOB: 1955-02-15  MRN: [id number]  Age / Sex: 62 y.o., male  PCP: [name], MD Referring Physician: Oswald Hillock, MD  Reason for Consultation: Establishing goals of care  HPI/Patient Profile: 62 y.o. male admitted on 11/08/2017    Clinical Assessment and Goals of Care:  62 year old gentleman who lives at home with his wife and family. 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  Patient was recently seen by medical oncology and likely has cholangiocarcinoma with liver lung and nodal metastases, and less likely lung primary. As per oncology, patient is incurable metastatic disease and goal of any therapy is palliative in nature to prolong his life and to improve cancer related symptoms.  Patient has had recurrent hospitalizations recently. He was admitted with abdominal pain and elevated bilirubin. This time, he has been admitted to hospital medicine service with sepsis, likely biliary source and is on several different antibiotics.he also has obstructive jaundice with a life limiting illness of intrahepatic cholangiocarcinoma. Also found to have segmental colitis on CT abdomen, remains on antibiotics. Patient recently underwent biliary drain placement.  Palliative care consultation for goals of care discussions. Patient is awake alert sitting in chair. His coworkers present at the bedside. I introduced myself and palliative care as follows: Palliative medicine is specialized medical care for people living with serious illness. It focuses on providing relief from the symptoms and stress of a serious illness. The goal is to improve  quality of life for both the patient and the family.  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 After one such meeting, a coworkers remarked to him that he appeared icteric. He went to his primary care physician had blood work done and was told to come to the emergency department. Since then, he sees his cancer was diagnosed. He also has a history of having had colon cancer in the past.  Goals wishes and values discussed. Life review performed. Patient states that he wants to get better and have more time available. He is hopeful that he will be able to begin treatment soon even if there palliative in nature. We reviewed about curative treatments versus palliative treatments. Patient likely will benefit from skilled nursing facility rehabilitation with palliative care following towards the end of this hospitalization. No additional palliative specific recommendations at this time. Thank you for the consult.  NEXT OF KIN  patient lives at home with his wife and daughter.  SUMMARY OF RECOMMENDATIONS    agree with CODE STATUS of DO NOT RESUSCITATE Patient on several different antibiotics, undergoing treatment for sepsis. Continue current mode of care Goals of care: Patient states that he knows he has serious  illness but hopes to have more time, hopes to get better, wants to have a additional treatments even if there palliative in nature to prolong his life and to improve his cancer related symptoms.  Code Status/Advance Care Planning:  DNR    Symptom Management:    continue current mode of care  Palliative Prophylaxis:   Delirium Protocol   Psycho-social/Spiritual:   Desire for further Chaplaincy support:yes  Additional Recommendations: Caregiving  Support/Resources  Prognosis:   Unable to determine  Discharge Planning: recommend skilled nursing  facility rehabilitation with palliative care to follow      Primary Diagnoses: Present on Admission: . Hypertension . Hyperlipemia . CAD (coronary artery disease)- PCI 2001- no evaluation since . Moderate protein-calorie malnutrition ([place]) . Alcoholic cirrhosis of liver without ascites ([place]) . Hyponatremia . Sepsis ([place]) . Obstructive jaundice . Intrahepatic cholangiocarcinoma ([place]) . Cirrhosis ([place]) . Segmental colitis ([place]) . Elevated lipase   I have reviewed the medical record, interviewed the patient and family, and examined the patient. The following aspects are pertinent.  Past Medical History:  Diagnosis Date  . Abscess of anal and rectal regions    horse shoe abscess  . Alcoholic cirrhosis ([place])   . Arthritis    "everywhere; especially in my spine"  . Cirrhosis ([place])    STAGE 1 CIRRHOSIS PATIENT SEES DR NAMDIGAM FOR  . Colon cancer ([place]) 05/2005   TX SURGERY WITH LYMPH NODE REMOVAL  . Cough    LAST FEW WEEKS SAW DR RAMOS LUNGS MILKY SPUTUM OCC  . DM type 2 with diabetic peripheral neuropathy ([facility])    left foot  . Fatigue   . Fistula, anal    multiple  . History of substance abuse ([place])    last alcohol was 08/2011; last marijuania was 08/2011  . Hypertension   . PTSD (post-traumatic stress disorder)    "severe" HX OF  . Sleep deprivation    Social History   Socioeconomic History  . Marital status: Married    Spouse name: Not on file  . Number of children: Not on file  . Years of education: Not on file  . Highest education level: Not on file  Occupational History  . Occupation: works at [employer]  . Financial resource strain: Not on file  . Food insecurity:    Worry: Not on file    Inability: Not on file  . Transportation needs:    Medical: Not on file    Non-medical: Not on file  Tobacco Use  . Smoking status: Former Smoker    Packs/day: 1.00    Years: 10.00    Pack years: 10.00    Types: Cigarettes    Last attempt to  quit: 01/08/1991    Years since quitting: 26.8  . Smokeless tobacco: Never Used  Substance and Sexual Activity  . Alcohol use: Yes    Comment: 06/05/11 "drank enough when I was younger to last me my whole life; don't remember when I had my last drink, maybe 2012"  . Drug use: Not Currently    Types: Oxycodone, Marijuana    Comment: 06/05/11 "pot head in my younger days; 2009 dr had me on oxycodone and valium for almost 14yr"  . Sexual activity: Not Currently  Lifestyle  . Physical activity:    Days per week: Not on file    Minutes per session: Not on file  . Stress: Not on file  Relationships  . Social connections:    Talks on  phone: Not on file    Gets together: Not on file    Attends religious service: Not on file    Active member of club or organization: Not on file    Attends meetings of clubs or organizations: Not on file    Relationship status: Not on file  Other Topics Concern  . Not on file  Social History Narrative  . Not on file   Family History  Problem Relation Age of Onset  . Heart disease Father   . Heart disease Mother   . Cancer Mother   . Cancer Sister   . Heart disease Brother    Scheduled Meds: . aspirin EC  81 mg Oral Daily  . atorvastatin  20 mg Oral q1800  . enoxaparin (LOVENOX) injection  40 mg Subcutaneous Q24H  . feeding supplement (ENSURE ENLIVE)  237 mL Oral BID BM  . gabapentin  400 mg Oral Daily  . gabapentin  800 mg Oral QHS  . insulin aspart  0-9 Units Subcutaneous Q4H  . insulin detemir  50 Units Subcutaneous q morning - 10a  . multivitamin with minerals  1 tablet Oral Daily   Continuous Infusions: . sodium chloride    . ceFEPime (MAXIPIME) IV 2 g (11/10/17 1423)  . metronidazole Stopped (11/10/17 1247)   PRN Meds:.sodium chloride, acetaminophen **OR** acetaminophen, ondansetron **OR** ondansetron (ZOFRAN) IV Medications Prior to Admission:  Prior to Admission medications   Medication Sig Start Date End Date Taking? Authorizing  Provider  aspirin EC 81 MG tablet Take 81 mg by mouth daily.   Yes [provider]  atorvastatin (LIPITOR) 40 MG tablet Take 1 tablet (40 mg total) by mouth daily at 6 PM. Patient taking differently: Take 20 mg by mouth daily at 6 PM.  12/02/12  Yes Viyuoh, Adeline C, MD  calcium carbonate (TUMS - DOSED IN MG ELEMENTAL CALCIUM) 500 MG chewable tablet Chew 1 tablet by mouth 2 (two) times daily as needed for indigestion or heartburn.   Yes [provider]  celecoxib (CELEBREX) 200 MG capsule Take 1 capsule by mouth daily. 10/10/17  Yes [provider]  empagliflozin (JARDIANCE) 10 MG TABS tablet Take 10 mg by mouth at bedtime.    Yes [provider]  Fexofenadine-Pseudoephedrine (ALLEGRA-D 24 HOUR PO) Take by mouth daily. WAL/FEX   Yes [provider]  gabapentin (NEURONTIN) 400 MG capsule Take 400-800 mg by mouth 2 (two) times daily. 1 capsule am, 2 capsules pm   Yes [provider]  glipiZIDE (GLUCOTROL) 5 MG tablet Take 5 mg by mouth 2 (two) times daily before a meal.   Yes [provider]  Insulin Detemir (LEVEMIR FLEXPEN) 100 UNIT/ML Pen Inject 10-60 Units into the skin 2 (two) times daily. Take 62 units in the am and Take 10 units in the pm   Yes [provider]  lisinopril (PRINIVIL,ZESTRIL) 20 MG tablet Take 20 mg by mouth daily.   Yes [provider]  metFORMIN (GLUCOPHAGE) 1000 MG tablet Take 1,000 mg by mouth 2 (two) times daily with a meal.   Yes [provider]  Multiple Vitamin (MULTIVITAMIN) tablet Take 1 tablet by mouth daily.   Yes [provider]  ondansetron (ZOFRAN) 8 MG tablet Take 1 tablet (8 mg total) by mouth 2 (two) times daily as needed. Start on the third day after chemotherapy. 11/06/17  Yes [name], MD  oxyCODONE 10 MG TABS Take 1 tablet (10 mg total) by mouth every 4 (four) hours  as needed for severe pain. 10/29/17  Yes Nita Sells, MD  polyvinyl alcohol (LIQUIFILM  TEARS) 1.4 % ophthalmic solution 1 drop as needed for dry eyes.   Yes [provider]  prochlorperazine (COMPAZINE) 10 MG tablet Take 1 tablet (10 mg total) by mouth every 6 (six) hours as needed (Nausea or vomiting). 11/06/17  Yes [name], MD  ALPRAZolam Duanne Moron) 0.5 MG tablet Take 1 tablet 1 hour before PET scan Patient not taking: Reported on 11/08/2017 11/07/17   Alla Feeling, NP   Allergies  Allergen Reactions  . Morphine And Related Other (See Comments)    PATIENT IS RECOVERING FROM DRUG ADDICTION PATIENT WANTS TO AVOID ANY NARCOTICS  . Other Other (See Comments)    PATIENT IS RECOVERING FROM DRUG ADDICTION PATIENT WANTS TO AVOID ANY NARCOTICS   Review of Systems Denies pain denies nausea eating well and having bowel movements does not complain of any symptom burden Physical Exam Icteric appearing gentleman sitting up in chair S1-S2 Clear regular breath sounds Abdomen is soft mildly distended, he has a biliary drain Awake alert oriented non focal No edema  Vital Signs: BP 110/62   Pulse 91   Temp 98 F (36.7 C) (Oral)   Resp 16   Ht 6' (1.829 m)   Wt 98.7 kg   SpO2 99%   BMI 29.51 kg/m  Pain Scale: 0-10   Pain Score: 0-No pain   SpO2: SpO2: 99 % O2 Device:SpO2: 99 % O2 Flow Rate: .   IO: Intake/output summary:   Intake/Output Summary (Last 24 hours) at 11/10/2017 1513 Last data filed at 11/10/2017 1423 Gross per 24 hour  Intake 1473.01 ml  Output 3955 ml  Net -2481.99 ml    LBM: Last BM Date: 11/09/17 Baseline Weight: Weight: 98.7 kg Most recent weight: Weight: 98.7 kg     Palliative Assessment/Data:   palliative performance scale 40%  Time In:  1400 Time Out:  1500 Time Total:  60 Greater than 50%  of this time was spent counseling and coordinating care related to the above assessment and plan.  Signed by: Loistine Chance, MD  [phone] Please contact Palliative Medicine Team phone at [phone] for questions and concerns.  For  individual provider: See Shea Evans

## 2017-11-11 ENCOUNTER — Encounter: Payer: Medicare Other | Admitting: Nutrition

## 2017-11-11 ENCOUNTER — Encounter (HOSPITAL_COMMUNITY): Payer: Self-pay | Admitting: *Deleted

## 2017-11-11 ENCOUNTER — Other Ambulatory Visit: Payer: Medicare Other

## 2017-11-11 DIAGNOSIS — A419 Sepsis, unspecified organism: Secondary | ICD-10-CM

## 2017-11-11 LAB — GLUCOSE, CAPILLARY
GLUCOSE-CAPILLARY: 93 mg/dL (ref 70–99)
Glucose-Capillary: 159 mg/dL — ABNORMAL HIGH (ref 70–99)

## 2017-11-11 LAB — CULTURE, BLOOD (ROUTINE X 2): Special Requests: ADEQUATE

## 2017-11-11 MED ORDER — SODIUM CHLORIDE 0.9 % IV SOLN
2.0000 g | INTRAVENOUS | Status: DC
  Filled 2017-11-11: qty 20

## 2017-11-11 MED ORDER — CEPHALEXIN 500 MG PO CAPS: 500.0000 mg | ORAL_CAPSULE | Freq: Three times a day (TID) | ORAL | 0 refills | Status: AC

## 2017-11-11 NOTE — Discharge Summary (Signed)
Physician Discharge Summary  Matthew Lewis HEN:[id number] DOB: May 04, 1955 DOA: 11/08/2017  PCP: [name], MD  Admit date: 11/08/2017 Discharge date: 11/11/2017  Time spent: 30 minutes  Recommendations for Outpatient Follow-up:  1. Follow up PCP in 2 weeks    Discharge Diagnoses:  Active Problems:   Hypertension   Hyperlipemia   Diabetes mellitus (HCC)   CAD (coronary artery disease)- PCI 2001- no evaluation since   Hyponatremia   Moderate protein-calorie malnutrition (HCC)   Sepsis (HCC)   Alcoholic cirrhosis of liver without ascites (HCC)   Obstructive jaundice   Cirrhosis (HCC)   Intrahepatic cholangiocarcinoma (HCC)   Segmental colitis (HCC)   Elevated lipase   Palliative care by specialist   Discharge Condition: Stable  Diet recommendation: carb modified diet  Filed Weights   11/09/17 1738  Weight: 98.7 kg    History of present illness:  62 year old man with history of metastatic cancer with involvement of liver, cirrhosis due to alcohol abuse, Matthew Lewis, stage II descending colon cancer 2007 status post left hemicolectomy, diabetes mellitus came to hospital with epigastric abdominal pain and a fever of 102 F.  His bilirubin was obstructed and was recently replaced on 11/07/2017. Patient was seen by oncology, and as per last note from [name] from 11/06/2017 cancer is likely cholangiocarcinoma with liver lung and node metastasis less likely lung primary.  Per [name],  patient has incurable metastatic disease and goal of therapy is palliative to prolong his life and improve cancer related symptoms.   Hospital Course:   1. Sepsis-likely biliary source, patient presented with sepsis physiology, started on broad-spectrum antibiotics vancomycin, cefepime, Flagyl.  Blood cultures growing Klebsiella oxytoca, patient was started on vancomycin and cefepime.   vancomycin was discontinued and continued with cefepime. Klebsiella is sensitive to Cefazolin, called and  discussed with ID Dr Linus Salmons, he recommends treating with Keflex. Will discharge home on Keflex 500 mg po TID x 7 days.  2. Elevated lipase-lipase was elevated to 167, has come down to 62.  No evidence of pancreatitis.  This is in setting of recent biliary obstruction with replacement of biliary drain recently.  3. Diabetes mellitus-continue oral hypoglycemic agents  4. Hypertension- continue antihypertensive medications   6. Intrahepatic cholangiocarcinoma-patient followed by oncology as outpatient. Prognosis is guarded  7. Segmental colitis- resolved, no diarrhea,seen on the CT abdomen, patient to be discharge home on Po Keflex    Procedures:    Consultations:    Discharge Exam: Vitals:   11/11/17 1100 11/11/17 1110  BP: 114/65   Pulse: 97   Resp: (!) 21   Temp:  98.4 F (36.9 C)  SpO2: 100%     General: Appears in no acute distress Cardiovascular: S1S2 RRR Respiratory: Clear bilaterally  Discharge Instructions   Discharge Instructions    Diet - low sodium heart healthy   Complete by:  As directed    Increase activity slowly   Complete by:  As directed      Allergies as of 11/11/2017      Reactions   Morphine And Related Other (See Comments)   PATIENT IS RECOVERING FROM DRUG ADDICTION PATIENT WANTS TO AVOID ANY NARCOTICS   Other Other (See Comments)   PATIENT IS RECOVERING FROM DRUG ADDICTION PATIENT WANTS TO AVOID ANY NARCOTICS      Medication List    TAKE these medications   ALLEGRA-D 24 HOUR PO Take by mouth daily. WAL/FEX   ALPRAZolam 0.5 MG tablet Commonly known as:  XANAX Take 1  tablet 1 hour before PET scan   aspirin EC 81 MG tablet Take 81 mg by mouth daily.   atorvastatin 40 MG tablet Commonly known as:  LIPITOR Take 1 tablet (40 mg total) by mouth daily at 6 PM. What changed:  how much to take   calcium carbonate 500 MG chewable tablet Commonly known as:  TUMS - dosed in mg elemental calcium Chew 1 tablet by mouth 2 (two)  times daily as needed for indigestion or heartburn.   celecoxib 200 MG capsule Commonly known as:  CELEBREX Take 1 capsule by mouth daily.   cephALEXin 500 MG capsule Commonly known as:  KEFLEX Take 1 capsule (500 mg total) by mouth 3 (three) times daily for 7 days.   gabapentin 400 MG capsule Commonly known as:  NEURONTIN Take 400-800 mg by mouth 2 (two) times daily. 1 capsule am, 2 capsules pm   glipiZIDE 5 MG tablet Commonly known as:  GLUCOTROL Take 5 mg by mouth 2 (two) times daily before a meal.   JARDIANCE 10 MG Tabs tablet Generic drug:  empagliflozin Take 10 mg by mouth at bedtime.   LEVEMIR FLEXPEN 100 UNIT/ML Pen Generic drug:  Insulin Detemir Inject 10-60 Units into the skin 2 (two) times daily. Take 62 units in the am and Take 10 units in the pm   lisinopril 20 MG tablet Commonly known as:  PRINIVIL,ZESTRIL Take 20 mg by mouth daily.   metFORMIN 1000 MG tablet Commonly known as:  GLUCOPHAGE Take 1,000 mg by mouth 2 (two) times daily with a meal.   multivitamin tablet Take 1 tablet by mouth daily.   ondansetron 8 MG tablet Commonly known as:  ZOFRAN Take 1 tablet (8 mg total) by mouth 2 (two) times daily as needed. Start on the third day after chemotherapy.   Oxycodone HCl 10 MG Tabs Take 1 tablet (10 mg total) by mouth every 4 (four) hours as needed for severe pain.   polyvinyl alcohol 1.4 % ophthalmic solution Commonly known as:  LIQUIFILM TEARS 1 drop as needed for dry eyes.   prochlorperazine 10 MG tablet Commonly known as:  COMPAZINE Take 1 tablet (10 mg total) by mouth every 6 (six) hours as needed (Nausea or vomiting).      Allergies  Allergen Reactions  . Morphine And Related Other (See Comments)    PATIENT IS RECOVERING FROM DRUG ADDICTION PATIENT WANTS TO AVOID ANY NARCOTICS  . Other Other (See Comments)    PATIENT IS RECOVERING FROM DRUG ADDICTION PATIENT WANTS TO AVOID ANY NARCOTICS      The results of significant diagnostics  from this hospitalization (including imaging, microbiology, ancillary and laboratory) are listed below for reference.    Significant Diagnostic Studies: Dg Chest 2 View  Result Date: 11/08/2017 CLINICAL DATA:  62 year old male with sepsis. EXAM: CHEST - 2 VIEW COMPARISON:  Chest CT dated 10/27/2017 FINDINGS: There is cardiomegaly with probable mild vascular congestion. No focal consolidation or pneumothorax. Bilateral lung consolidation/masses seen on the prior CT are suboptimally visualized on this radiograph. Possible trace right pleural effusion. No acute osseous pathology. A pigtail tube is noted in the upper abdomen likely corresponding to the percutaneous biliary drain catheter. IMPRESSION: 1. Cardiomegaly with probable mild vascular congestion. 2. Suboptimal visualization of the pulmonary masses/nodules seen on the prior CT. 3. Probable small right pleural effusion. Electronically Signed   By: Anner Crete M.D.   On: 11/08/2017 21:22   Ct Chest Wo Contrast  Result Date: 10/27/2017 CLINICAL DATA:  Multifocal liver lesions.  Weight loss. EXAM: CT CHEST WITHOUT CONTRAST TECHNIQUE: Multidetector CT imaging of the chest was performed following the standard protocol without IV contrast. COMPARISON:  None FINDINGS: Cardiovascular: Normal heart size. No pericardial effusion. Aortic atherosclerosis. Calcification scratch set 3 vessel coronary artery atherosclerotic calcifications. Mediastinum/Nodes: Normal appearance of the thyroid gland. The trachea appears patent and is midline. Normal appearance of the esophagus. Subcarinal lymph node is enlarged measuring 2.1 cm. Enlarged azygos-esophageal lymph node measures 2 cm, image 28/2. Right hilar lymph node is enlarged measuring 1.8 cm, image 82/2. Lungs/Pleura: Subpleural area of masslike architectural distortion within the posterolateral right lung base is identified measuring 4.9 x 5.0 cm, image 104/2. Nodule within the left lower lobe measures 2.4 cm,  image 111/7. Upper Abdomen: Cirrhotic appearing liver is again noted. There has been interval placement of percutaneous transit [facility] biliary drainage catheter. Musculoskeletal: No chest wall mass or suspicious bone lesions identified. IMPRESSION: 1. Large mass in the posterolateral right lung base and left lower lobe pulmonary nodule. Suspicious for malignancy. Cannot rule out metastatic disease from suspected multifocal hepatoma. 2. Right hilar and mediastinal adenopathy. 3.  Aortic Atherosclerosis (ICD10-I70.0). 4. Multi vessel coronary artery atherosclerotic calcifications. Electronically Signed   By: Kerby Moors M.D.   On: 10/27/2017 11:58   US Abdomen Complete  Result Date: 10/23/2017 CLINICAL DATA:  Jaundiced. EXAM: ABDOMEN ULTRASOUND COMPLETE COMPARISON:  Body CT 02/24/2017 FINDINGS: Gallbladder: No gallstones seen. There is gallbladder wall thickening measuring 6 mm. No sonographic Murphy sign noted by sonographer. Common bile duct: Diameter: 6.3 mm Liver: Coarse nodular echogenicity of the liver. Area of heterogeneity or an ill-defined mass in segment 6 of the liver measures 2.6 x 2.4 x 2.2 cm. Portal vein is patent on color Doppler imaging with normal direction of blood flow towards the liver. IVC: No abnormality visualized. Pancreas: Visualized portion unremarkable. Spleen: Splenomegaly. Splenic length is 16.2 cm with volume of 994 cubic cm. Right Kidney: Length: 14.8 cm. Echogenicity within normal limits. No mass or hydronephrosis visualized. Left Kidney: Length: 14.7 cm. Echogenicity within normal limits. No mass or hydronephrosis visualized. Abdominal aorta: No aneurysm visualized. Other findings: None. IMPRESSION: Cirrhotic appearance of the liver. 2.6 cm questionable ill-defined mass versus area of focal parenchymal heterogeneity in segment 6 of the liver. If further imaging evaluation is desired, liver protocol abdominal MRI should be considered. Splenomegaly. Apparent thickening of the  gallbladder wall, without evidence of cholelithiasis, and with negative sonographic Murphy's sign. The findings are favored to represent gallbladder wall edema secondary to systemic causes such as hyperproteinemia rather than acute cholecystitis. Please correlate clinically. Electronically Signed   By: Fidela Salisbury M.D.   On: 10/23/2017 20:58   Ct Abdomen Pelvis W Contrast  Result Date: 11/08/2017 CLINICAL DATA:  62 year old male with history of lung cancer presenting with generalized weakness and jaundice. EXAM: CT ABDOMEN AND PELVIS WITH CONTRAST TECHNIQUE: Multidetector CT imaging of the abdomen and pelvis was performed using the standard protocol following bolus administration of intravenous contrast. CONTRAST:  154m ISOVUE-300 IOPAMIDOL (ISOVUE-300) INJECTION 61% COMPARISON:  CT of the abdomen pelvis dated 11/06/2016 FINDINGS: Lower chest: Partially visualized small right pleural effusion with associated compressive atelectasis of the right lower lobe. A partially visualized 5.1 x 3.8 cm right lung base mass and a 1.8 x 2.3 cm left lung base nodule. There is coronary vascular calcification. Small pocket of pneumoperitoneum along the lateral aspect of the liver related to biliary drain catheter placement. There is a small ascites. Hepatobiliary: Morphologic  changes of cirrhosis. Multiple hepatic hypodense lesions as seen previously most consistent with metastatic disease. Ill-defined 2.1 x 5.1 cm hypodense area in the left lobe of the liver as seen previously appears to communicate with the left portal vein and concerning for a tumor thrombus or a bland thrombus within the left portal vein. A primary biliary neoplasm such as cholangiocarcinoma or metastatic disease are not excluded. There is layering sludge or small stones within the gallbladder. A percutaneous biliary drain with pigtail tip noted in similar position. No significant interval change in the dilatation of the intrahepatic biliary trees  compared to the most recent prior CT. Pancreas: Unremarkable. No pancreatic ductal dilatation or surrounding inflammatory changes. Spleen: Normal in size without focal abnormality. Adrenals/Urinary Tract: Bilateral adrenal masses most consistent with metastatic disease. There is no hydronephrosis on either side. Punctate nonobstructing left renal inferior pole calculus. The visualized ureters and urinary bladder appear unremarkable. Stomach/Bowel: Surgical material noted in the region of the rectum similar to prior CT. Postsurgical changes and suture in the descending colon. There is no bowel obstruction. Mild thickened appearance of the hepatic flexure, likely reactive to cirrhosis and ascites/hepatic colopathy. Colitis is less likely. Clinical correlation is recommended. The appendix is normal. Vascular/Lymphatic: Mild atherosclerotic calcification of the aorta. No portal venous gas. Retroperitoneal adenopathy. A 3.6 x 2.2 cm nodular density with surrounding there is no plastic reaction in the left lower abdomen (series 2, image 65) most consistent with mesenteric or serosal metastatic disease/implant. Reproductive: The prostate and seminal vesicles are grossly unremarkable. Other: Mild diffuse subcutaneous edema. Musculoskeletal: Degenerative changes of the spine. Bilateral L5 pars defects with grade 1 L5-S1 anterolisthesis. No acute osseous pathology. IMPRESSION: 1. Overall no significant interval change since the CT of 11/06/2017. Stable positioning of the percutaneous biliary drain with similar degree of biliary ductal dilatation. 2. Cirrhosis with multiple hepatic metastatic disease. Infiltrative appearing lesion in the left lobe of the liver appears to extend into the left portal vein and may represent a tumor thrombus, or bland thrombus. A primary biliary neoplasm (cholangiocarcinoma, or metastatic disease are other considerations. 3. Retroperitoneal, mesenteric, and adrenal metastatic disease. 4. Hepatic  colopathy versus less likely segmental colitis of the hepatic flexure. 5. Small right pleural effusion and bilateral lung base pulmonary masses/nodules as seen previously. 6. Tiny pockets of pneumoperitoneum lateral to the liver along the percutaneous drain, decreased since the prior CT. 7. Other findings as above. Electronically Signed   By: Anner Crete M.D.   On: 11/08/2017 22:50   Ct Abdomen Pelvis W Contrast  Result Date: 11/06/2017 CLINICAL DATA:  Infiltrative lesion within the liver. Suspicion of cholangiocarcinoma. Cirrhotic liver. EXAM: CT ABDOMEN AND PELVIS WITH CONTRAST TECHNIQUE: Multidetector CT imaging of the abdomen and pelvis was performed using the standard protocol following bolus administration of intravenous contrast. CONTRAST:  123m ISOVUE-300 IOPAMIDOL (ISOVUE-300) INJECTION 61% COMPARISON:  CT 10/27/2017, 02/24/2017, MRI 10/25/2017 FINDINGS: Lower chest: Partially loculated fluid at the RIGHT lung base is increased compared to prior. Basilar atelectasis. There is masslike consolidation in the RIGHT lower lobe measuring 3.7 cm unchanged. In LEFT lower lobe there is nodular metastasis measuring 2.6 cm not significant changed. Hepatobiliary: Percutaneous drain extends the porta hepatis from a RIGHT flank approach. Small pneumothorax at the RIGHT lateral lung base (image 27/4) associated with the chest tube. Centrally within liver, this ill-defined mass in central LEFT hepatic lobe measuring approximately 5.1 by 5.1 cm. There is biliary obstruction of the LEFT hepatic lobe. There are 2 round discrete  lesion in the RIGHT hepatic lobe. One the subcapsular anterior RIGHT hepatic lobe measuring 2.3 cm. One in the inferior RIGHT hepatic lobe measuring 1.8 cm Gallbladder is normal. Common bile duct normal. Liver has a nodular contour with enlargement of the caudate lobe Pancreas: Normal pancreatic parenchymal intensity. No ductal dilatation or inflammation. Spleen: Normal spleen.  Adrenals/urinary tract: Adrenal glands demonstrate bilateral metastatic lesions not changed. Kidneys, ureters and bladder normal Stomach/Bowel: Stomach, small-bowel normal. There is anastomosis along the descending colon. No obstruction. Rectum normal. Vascular/Lymphatic: Abdominal aorta normal caliber. No retroperitoneal periportal adenopathy. Metastatic lymph node positioned between the IVC in the aorta measures 16 mm short axis not changed from prior. No pelvic lymphadenopathy. No inguinal adenopathy. Reproductive: Prostate normal Musculoskeletal: No aggressive osseous lesion. IMPRESSION: 1. Increase in RIGHT pleural effusion. Stable masslike consolidation at the RIGHT lung base representing neoplasm versus pneumonia. 2. Stable metastatic nodule at the RIGHT lung base. 3. Infiltrative mass in the central LEFT hepatic lobe and discrete lesions in the RIGHT hepatic lobe consistent with metastatic disease versus primary hepatic biliary carcinoma. Favor cholangiocarcinoma. 4. Percutaneous drainage catheter extends the porta hepatis. Small trapped pneumothorax along the tract of the catheter at the inferolateral RIGHT lung base. 5. Stable adrenal metastasis. 6. Metastatic adenopathy in the retroperitoneum unchanged. 7. Morphologic changes in liver consistent with  cirrhosis. Electronically Signed   By: Suzy Bouchard M.D.   On: 11/06/2017 17:04   Ct Abdomen Pelvis W Contrast  Result Date: 10/27/2017 CLINICAL DATA:  Drain placed yesterday with little output. EXAM: CT ABDOMEN AND PELVIS WITH CONTRAST TECHNIQUE: Multidetector CT imaging of the abdomen and pelvis was performed using the standard protocol following bolus administration of intravenous contrast. CONTRAST:  123m ISOVUE-300 IOPAMIDOL (ISOVUE-300) INJECTION 61% COMPARISON:  chest CT earlier today.  Abdominal CT 02/24/2017 FINDINGS: Lower chest: Subcarinal adenopathy partially imaged on the highest image, measuring 2.2 cm in short axis diameter. Left  lower lobe nodule again noted, 2.5 cm. Masslike airspace opacity noted posteriorly in the right lower lobe, stable. Trace right pleural effusion. Hepatobiliary: Cirrhotic changes within the liver. Percutaneous transhepatic biliary drain in place. The tip is in the distal common bile duct. There is mild intrahepatic biliary ductal dilatation, particularly in the left hepatic lobe which appear stable since earlier chest CT. Multifocal areas of masslike low-density noted within the liver concerning for multifocal hepatoma or metastases. Largest is noted centrally in the left hepatic lobe measuring 5.1 x 4.2 cm. 1.9 cm low-density area in the anterior right hepatic lobe on image 16. 2.5 cm low-density lesion in the right inferior hepatic lobe. Stones or sludge noted within the gallbladder. Pancreas: No focal abnormality or ductal dilatation. Spleen: No focal abnormality. Splenomegaly. Craniocaudal length measures 17 cm. Adrenals/Urinary Tract: Bilateral adrenal nodules and masses compatible with metastases, the largest in the right adrenal gland measuring 3.5 cm. No renal mass or hydronephrosis. Urinary bladder is unremarkable. Stomach/Bowel: Moderate to large stool burden in the colon with mild gaseous distention of the colon. Stomach and small bowel decompressed, unremarkable. Vascular/Lymphatic: Aorta is normal caliber. No retroperitoneal adenopathy. Aortocaval node has short axis diameter of 2.2 cm on image 45. Aortic calcifications. Reproductive: Mildly prominent prostate. Other: No free fluid or free air. Musculoskeletal: No acute bony abnormality. Degenerative disc and facet disease throughout the lumbar spine. IMPRESSION: Changes of cirrhosis. Multiple hepatic lesions are noted concerning for multifocal hepatoma or metastases. Biliary drain is in place with the tip in the distal common bile duct. There is mild intrahepatic biliary  ductal dilatation. Bilateral adrenal nodules and masses, right larger than left  compatible with metastases. Retroperitoneal adenopathy. Large stool burden with mild gaseous distention of the colon. Aortic atherosclerosis. Electronically Signed   By: Rolm Baptise M.D.   On: 10/27/2017 21:04   Mr 3d Recon At Scanner  Result Date: 10/25/2017 CLINICAL DATA:  Evaluate liver lesion. EXAM: MRI ABDOMEN WITHOUT AND WITH CONTRAST (INCLUDING MRCP) TECHNIQUE: Multiplanar multisequence MR imaging of the abdomen was performed both before and after the administration of intravenous contrast. Heavily T2-weighted images of the biliary and pancreatic ducts were obtained, and three-dimensional MRCP images were rendered by post processing. CONTRAST:  10 cc Gadavist COMPARISON:  CT AP 02/24/2017. FINDINGS: Lower chest: Bilateral airspace consolidation identified. Hepatobiliary: The liver is cirrhotic. There is a diffuse nodular contour with hypertrophy of the caudate lobe and lateral segment of left lobe. There has been interval development of multiple suspicious enhancing liver lesions involving both lobes. -index lesion within the anterior aspect of segment 7 measures 3.6 cm, image 30/26. -Index lesion within segment 6 measures 2.4 cm, image 63/26. -index lesion within segment 2 measures 1.3 cm, image 45/28. -index lesion is within segment 4B measures 4.3 cm, image 45/26. There is associated tumor thrombus involving the left branch of the portal vein, image 39/28. Additionally, this mass obstructs the right medial hepatic duct and left main hepatic duct. The right lateral (posterior) duct appears mildly dilated and may be partially obstructed. Mild gallbladder wall edema and pericholecystic fluid. No common bile duct dilatation. Pancreas: No mass, inflammatory changes, or other parenchymal abnormality identified. Spleen:  Splenomegaly. Adrenals/Urinary Tract: New bilateral adrenal lesions compatible with metastatic disease. Right adrenal nodule measures 4 cm, image 49/32. The left adrenal metastasis measures  1.7 cm, image 56/32. Unremarkable appearance of the kidneys. Stomach/Bowel: Visualized portions within the abdomen are unremarkable. Vascular/Lymphatic: Aortic atherosclerosis. No aneurysm. The main portal vein appears patent. Interval development of retroperitoneal adenopathy. Index right retrocaval node measures 1.7 cm, image 79/28. Small distal esophageal and perigastric varices. Other:  Mild perihepatic ascites. Musculoskeletal: No suspicious bone lesions identified. IMPRESSION: 1. Advanced changes of cirrhosis. Interval development of multifocal enhancing liver lesions, which is concerning for multicentric hepatocellular carcinoma involving both lobes of liver. 2. Lesion within segment 4 B obstructs the right medial hepatic duct and left main duct resulting in moderate to marked biliary ductal dilatation within the left lobe and dome of liver. There may be partial obstruction of the right lateral duct with mild dilatation. 3. Tumor thrombus within the left branch of the portal vein likely secondary to segment 4 B tumor. 4. Bilateral adrenal metastasis and retroperitoneal nodal metastasis. 5. Stigmata of portal venous hypertension including varices, ascites and splenomegaly. 6. These results will be called to the ordering clinician or representative by the Radiologist Assistant, and communication documented in the PACS or zVision Dashboard. Electronically Signed   By: Kerby Moors M.D.   On: 10/25/2017 13:31   US Abdomen Limited  Result Date: 10/29/2017 CLINICAL DATA:  Prior imaging demonstrating cirrhosis, liver lesions, bilateral lung masses, bilateral adrenal masses and retroperitoneal and subcarinal lymphadenopathy. Imaging of the liver is performed by ultrasound in anticipation of possible ultrasound-guided liver lesion biopsy. EXAM: ULTRASOUND ABDOMEN LIMITED COMPARISON:  MRI of the abdomen on 10/25/2017 and CT of the abdomen on 10/27/2017 FINDINGS: Liver: Previous suggested lesions by both MRI and  CT are occult by ultrasound and no discrete lesions are identified to allow accurate liver biopsy under ultrasound guidance. Cirrhosis of the liver  again noted. There is dilatation of intrahepatic bile ducts within the left lobe of the liver. Right-sided bile ducts are decompressed after recent placement of a biliary drainage catheter. Biliary sludge is noted in the gallbladder lumen. IMPRESSION: Liver lesions are occult by ultrasound and ultrasound-guided liver biopsy was not able to be performed. There remains dilatation of left lobe intrahepatic bile ducts. Electronically Signed   By: Aletta Edouard M.D.   On: 10/29/2017 13:45   Ct Biopsy  Result Date: 10/29/2017 CLINICAL DATA:  Bilateral adrenal masses, right greater than left, lung lesions, liver lesions and lymphadenopathy. Liver lesions were occult by ultrasound and decision has been made to now perform a CT-guided right adrenal mass biopsy. EXAM: CT GUIDED CORE BIOPSY OF RIGHT ADRENAL GLAND MASS ANESTHESIA/SEDATION: 2.0 mg IV Versed; 100 mcg IV Fentanyl Total Moderate Sedation Time:  13 minutes. The patient's level of consciousness and physiologic status were continuously monitored during the procedure by Radiology nursing. PROCEDURE: The procedure risks, benefits, and alternatives were explained to the patient. Questions regarding the procedure were encouraged and answered. The patient understands and consents to the procedure. A time-out was performed prior to initiating the procedure. CT was performed in a nearly decubitus position with the right side down. The right flank region was prepped with chlorhexidine in a sterile fashion, and a sterile drape was applied covering the operative field. A sterile gown and sterile gloves were used for the procedure. Local anesthesia was provided with 1% Lidocaine. Under CT guidance, a 17 gauge needle was advanced to the level of a right adrenal mass. Three separate coaxial 18 gauge core biopsy samples were  obtained and submitted in formalin. The outer needle was removed and additional CT performed. COMPLICATIONS: None FINDINGS: The right adrenal gland is enlarged by a mass measuring approximately 3.1 x 4.4 cm. Solid tissue was obtained from the lesion. IMPRESSION: CT-guided core biopsy performed of a right adrenal mass. Electronically Signed   By: Aletta Edouard M.D.   On: 10/29/2017 15:50   Dg Sinus/fist Tube Chk-non Gi  Result Date: 11/06/2017 INDICATION: Internal external biliary drain, decreased output EXAM: FLUOROSCOPIC INJECTION OF THE RIGHT INTERNAL EXTERNAL BILIARY DRAIN MEDICATIONS: NONE. ANESTHESIA/SEDATION: NONE. COMPLICATIONS: None immediate. PROCEDURE: Under sterile conditions, the existing right internal external biliary drain was injected with fluoroscopic imaging performed. This demonstrates accumulation of contrast at the right liver edge. Drain has significantly retracted with the catheter tip positioned in the proximal CBD. Distal aspect of the biliary drain does not opacify. Therefore, the tube may also be occluded. There is little bile output from the catheter. IMPRESSION: Significant retraction of the internal external biliary drain now with the tip position in the proximal CBD. Contrast injection accumulates at the liver margin where exposed sideholes are likely extrahepatic. Biliary system is not opacified. PLAN: Schedule for internal external biliary exchange and reposition tomorrow. Electronically Signed   By: Jerilynn Mages.  Shick M.D.   On: 11/06/2017 13:49   Mr Abdomen Mrcp Moise Boring Contast  Result Date: 10/25/2017 CLINICAL DATA:  Evaluate liver lesion. EXAM: MRI ABDOMEN WITHOUT AND WITH CONTRAST (INCLUDING MRCP) TECHNIQUE: Multiplanar multisequence MR imaging of the abdomen was performed both before and after the administration of intravenous contrast. Heavily T2-weighted images of the biliary and pancreatic ducts were obtained, and three-dimensional MRCP images were rendered by post  processing. CONTRAST:  10 cc Gadavist COMPARISON:  CT AP 02/24/2017. FINDINGS: Lower chest: Bilateral airspace consolidation identified. Hepatobiliary: The liver is cirrhotic. There is a diffuse nodular contour with hypertrophy  of the caudate lobe and lateral segment of left lobe. There has been interval development of multiple suspicious enhancing liver lesions involving both lobes. -index lesion within the anterior aspect of segment 7 measures 3.6 cm, image 30/26. -Index lesion within segment 6 measures 2.4 cm, image 63/26. -index lesion within segment 2 measures 1.3 cm, image 45/28. -index lesion is within segment 4B measures 4.3 cm, image 45/26. There is associated tumor thrombus involving the left branch of the portal vein, image 39/28. Additionally, this mass obstructs the right medial hepatic duct and left main hepatic duct. The right lateral (posterior) duct appears mildly dilated and may be partially obstructed. Mild gallbladder wall edema and pericholecystic fluid. No common bile duct dilatation. Pancreas: No mass, inflammatory changes, or other parenchymal abnormality identified. Spleen:  Splenomegaly. Adrenals/Urinary Tract: New bilateral adrenal lesions compatible with metastatic disease. Right adrenal nodule measures 4 cm, image 49/32. The left adrenal metastasis measures 1.7 cm, image 56/32. Unremarkable appearance of the kidneys. Stomach/Bowel: Visualized portions within the abdomen are unremarkable. Vascular/Lymphatic: Aortic atherosclerosis. No aneurysm. The main portal vein appears patent. Interval development of retroperitoneal adenopathy. Index right retrocaval node measures 1.7 cm, image 79/28. Small distal esophageal and perigastric varices. Other:  Mild perihepatic ascites. Musculoskeletal: No suspicious bone lesions identified. IMPRESSION: 1. Advanced changes of cirrhosis. Interval development of multifocal enhancing liver lesions, which is concerning for multicentric hepatocellular  carcinoma involving both lobes of liver. 2. Lesion within segment 4 B obstructs the right medial hepatic duct and left main duct resulting in moderate to marked biliary ductal dilatation within the left lobe and dome of liver. There may be partial obstruction of the right lateral duct with mild dilatation. 3. Tumor thrombus within the left branch of the portal vein likely secondary to segment 4 B tumor. 4. Bilateral adrenal metastasis and retroperitoneal nodal metastasis. 5. Stigmata of portal venous hypertension including varices, ascites and splenomegaly. 6. These results will be called to the ordering clinician or representative by the Radiologist Assistant, and communication documented in the PACS or zVision Dashboard. Electronically Signed   By: Kerby Moors M.D.   On: 10/25/2017 13:31   Ir Int Lianne Cure Biliary Drain With Cholangiogram  Result Date: 10/26/2017 INDICATION: 62 year old male with a history of cirrhosis, presumed HCC, portal vein thrombosis, and jaundice with evidence of obstructed biliary system at the liver hilum. EXAM: IMAGE GUIDED PERCUTANEOUS TRANSHEPATIC CHOLANGIOGRAM WITH INTERNAL/EXTERNAL BILIARY DRAINAGE MEDICATIONS: Ciprofloxacin 400 mg IV; The antibiotic was administered within an appropriate time frame prior to the initiation of the procedure. 4 MG IV ZOFRAN ANESTHESIA/SEDATION: Moderate (conscious) sedation was employed during this procedure. A total of Versed 4.0 mg and Fentanyl 100 mcg was administered intravenously. Moderate Sedation Time: 17 MINUTES minutes. The patient's level of consciousness and vital signs were monitored continuously by radiology nursing throughout the procedure under my direct supervision. FLUOROSCOPY TIME:  Fluoroscopy Time: 3 minutes 18 seconds (175 mGy). COMPLICATIONS: None PROCEDURE: The procedure, risks, benefits, and alternatives were explained to the patient and the patient's family. A complete informed consent was performed, with risk benefit  analysis. Specific risks that were discussed for the procedure include bleeding, infection, biliary sepsis, IC use day, organ injury, need for further procedure, need for further surgery, long-term drain placement, cardiopulmonary collapse, death. Questions regarding the procedure were encouraged and answered. The patient understands and consents to the procedure. Patient is position in supine position on the fluoroscopy table, and the upper abdomen was prepped and draped in the usual sterile fashion. Maximum barrier  sterile technique with sterile gowns and gloves were used for the procedure. A timeout was performed prior to the initiation of the procedure. Local anesthesia was provided with 1% lidocaine with epinephrine. Ultrasound survey of the left liver lobe was performed, with then ultrasound of the right liver lobe. 1% lidocaine was used for local anesthesia, with generous infiltration of the skin and subcutaneous tissues in and inter left costal location. Formal PTC puncture of the right liver was performed from intercostal approach. Once the tip of the needle was confirmed within the biliary system by observing for spontaneous bile return, and injecting small aliquots of contrast, images were stored of the biliary system after partially opacifying the biliary tree via the needle. A second access was required for access into the biliary system, from similar right-sided intercostal location. Once the tip of this second needle was confirmed within the biliary system, an 018 wire was advanced centrally. The needle was removed, a small incision was made with an 11 blade scalpel, and then a triaxial Accustick system was advanced into the biliary system. The metal stiffener and dilator were removed, we confirmed placement with contrast infusion. A coaxial Glidewire and 4 French glide cath were then used to navigate across the obstruction at the hilum of the liver. Once the catheter was presumed to be within the  duodenum, the wire was removed and contrast confirmed location. A stiff Amplatz wire was advanced through the system, and the Accustick and Glidewire were removed. Dilation of the subcutaneous tissue tracks was performed with an 8 [place] dilator, and then a 10 [place] biliary drain was placed as an internal/external biliary drain. Small amount of contrast confirmed location. The patient remained hemodynamically stable throughout. Placement of the drain resulted in significant nausea. We elected to defer the left-sided drainage at this time. No complications were encountered and no significant blood loss was encountered. IMPRESSION: Status post formal PTC and internal/external biliary drainage. Signed, Dulcy Fanny. Dellia Nims, RPVI [facility] Electronically Signed   By: Corrie Mckusick D.O.   On: 10/26/2017 14:45   Ir Exchange Biliary Drain  Result Date: 11/08/2017 INDICATION: 62 year old male with a history of previous percutaneous placement of internal external biliary drain. Drain has become withdrawn and he presents for replacement. EXAM: IMAGE GUIDED REPLACEMENT OF INTERNAL/EXTERNAL BILIARY DRAIN. MEDICATIONS: None ANESTHESIA/SEDATION: Moderate (conscious) sedation was employed during this procedure. A total of Versed 1.0 mg and Fentanyl 25 mcg was administered intravenously. Moderate Sedation Time: 10 minutes. The patient's level of consciousness and vital signs were monitored continuously by radiology nursing throughout the procedure under my direct supervision. FLUOROSCOPY TIME:  Fluoroscopy Time: 1 minutes 54 seconds (121 mGy). COMPLICATIONS: None PROCEDURE: Informed written consent was obtained from the patient after a thorough discussion of the procedural risks, benefits and alternatives. All questions were addressed. Maximal Sterile Barrier Technique was utilized including caps, mask, sterile gowns, sterile gloves, sterile drape, hand hygiene and  skin antiseptic. A timeout was performed prior to the initiation of the procedure. Patient positioned supine position on the II table. Images were stored sent to PACs. Patient is prepped and draped in the usual sterile fashion including the indwelling drain. Gentle injection of contrast confirmed the location of the existing drain. Using modified Seldinger technique, the internal external biliary drain was exchanged for a new 10 [place] drain, with the loop curled within the duodenum. Final images were stored confirming flow antegrade. Catheter was sutured in position attached to gravity drainage. Patient tolerated  the procedure well and remained hemodynamically stable throughout. No complications were encountered and no significant blood loss. IMPRESSION: Status post exchange of displaced internal/external biliary drain with placement of a new 10 French tube. Signed, Dulcy Fanny. Dellia Nims, RPVI [facility] Electronically Signed   By: Corrie Mckusick D.O.   On: 11/08/2017 08:30   Ir Radiologist Eval & Mgmt  Result Date: 11/06/2017 Please refer to notes tab for details about interventional procedure. (Op Note)   Microbiology: Recent Results (from the past 240 hour(s))  Culture, blood (Routine x 2)     Status: Abnormal   Collection Time: 11/08/17  8:26 PM  Result Value Ref Range Status   Specimen Description   Final    BLOOD RIGHT HAND Performed at [facility] [address] [place], [place] [address]    Special Requests   Final    BOTTLES DRAWN AEROBIC AND ANAEROBIC Blood Culture results may not be optimal due to an excessive volume of blood received in culture bottles Performed at [facility] [address] [place], [place] [address]    Culture  Setup Time   Final    GRAM NEGATIVE RODS AEROBIC BOTTLE ONLY CRITICAL VALUE NOTED.  VALUE IS CONSISTENT WITH PREVIOUSLY REPORTED AND CALLED VALUE.     Culture (A)  Final    KLEBSIELLA OXYTOCA SUSCEPTIBILITIES PERFORMED ON PREVIOUS CULTURE WITHIN THE LAST 5 DAYS. Performed at [facility], [place] [address] [place], [place] [address]    Report Status 11/11/2017 FINAL  Final  Culture, blood (Routine x 2)     Status: Abnormal   Collection Time: 11/08/17  8:26 PM  Result Value Ref Range Status   Specimen Description   Final    BLOOD RIGHT FOREARM Performed at [facility] [address], [place], [place] [address]    Special Requests   Final    BOTTLES DRAWN AEROBIC AND ANAEROBIC Blood Culture adequate volume Performed at [facility] [address] [place], [place] [address]    Culture  Setup Time   Final    GRAM NEGATIVE RODS IN BOTH AEROBIC AND ANAEROBIC BOTTLES CRITICAL RESULT CALLED TO, READ BACK BY AND VERIFIED WITH: [place] B [id number] FCP Performed at [facility], [place] [address] [place], [place] [address]    Culture KLEBSIELLA OXYTOCA (A)  Final   Report Status 11/11/2017 FINAL  Final   Organism ID, Bacteria KLEBSIELLA OXYTOCA  Final      Susceptibility   Klebsiella oxytoca - MIC*    AMPICILLIN >=32 RESISTANT Resistant     CEFAZOLIN <=4 SENSITIVE Sensitive     CEFEPIME <=1 SENSITIVE Sensitive     CEFTAZIDIME <=1 SENSITIVE Sensitive     CEFTRIAXONE <=1 SENSITIVE Sensitive     CIPROFLOXACIN <=0.25 SENSITIVE Sensitive     GENTAMICIN <=1 SENSITIVE Sensitive     IMIPENEM <=0.25 SENSITIVE Sensitive     TRIMETH/SULFA <=20 SENSITIVE Sensitive     AMPICILLIN/SULBACTAM 4 SENSITIVE Sensitive     PIP/TAZO <=4 SENSITIVE Sensitive     Extended ESBL NEGATIVE Sensitive     * KLEBSIELLA OXYTOCA  Blood Culture ID Panel (Reflexed)     Status: Abnormal   Collection Time: 11/08/17  8:26 PM  Result Value Ref Range Status   Enterococcus species NOT DETECTED NOT DETECTED Final   Listeria monocytogenes NOT DETECTED NOT DETECTED Final   Staphylococcus species NOT DETECTED NOT  DETECTED Final  Staphylococcus aureus (BCID) NOT DETECTED NOT DETECTED Final   Streptococcus species NOT DETECTED NOT DETECTED Final   Streptococcus agalactiae NOT DETECTED NOT DETECTED Final   Streptococcus pneumoniae NOT DETECTED NOT DETECTED Final   Streptococcus pyogenes NOT DETECTED NOT DETECTED Final   Acinetobacter baumannii NOT DETECTED NOT DETECTED Final   Enterobacteriaceae species DETECTED (A) NOT DETECTED Final    Comment: Enterobacteriaceae represent a large family of gram-negative bacteria, not a single organism. CRITICAL RESULT CALLED TO, READ BACK BY AND VERIFIED WITH: PHARMD Alma B [id number] FCP    Enterobacter cloacae complex NOT DETECTED NOT DETECTED Final   Escherichia coli NOT DETECTED NOT DETECTED Final   Klebsiella oxytoca DETECTED (A) NOT DETECTED Final    Comment: CRITICAL RESULT CALLED TO, READ BACK BY AND VERIFIED WITH: PHARMD MICHELLE B [id number] FCP    Klebsiella pneumoniae NOT DETECTED NOT DETECTED Final   Proteus species NOT DETECTED NOT DETECTED Final   Serratia marcescens NOT DETECTED NOT DETECTED Final   Carbapenem resistance NOT DETECTED NOT DETECTED Final   Haemophilus influenzae NOT DETECTED NOT DETECTED Final   Neisseria meningitidis NOT DETECTED NOT DETECTED Final   Pseudomonas aeruginosa NOT DETECTED NOT DETECTED Final   Candida albicans NOT DETECTED NOT DETECTED Final   Candida glabrata NOT DETECTED NOT DETECTED Final   Candida krusei NOT DETECTED NOT DETECTED Final   Candida parapsilosis NOT DETECTED NOT DETECTED Final   Candida tropicalis NOT DETECTED NOT DETECTED Final    Comment: Performed at [facility], [place] [address], [place], [place] [address]  Urine Culture     Status: None   Collection Time: 11/09/17 12:35 AM  Result Value Ref Range Status   Specimen Description   Final    URINE, CLEAN CATCH Performed at [facility], [place] [address] [place], [place] [address]    Special Requests   Final     NONE Performed at [facility], [place] [address], [place], [place] [address]    Culture   Final    NO GROWTH Performed at [facility], [place] [address] [place], [place] [address]    Report Status 11/10/2017 FINAL  Final  MRSA PCR Screening     Status: None   Collection Time: 11/09/17  5:38 PM  Result Value Ref Range Status   MRSA by PCR NEGATIVE NEGATIVE Final    Comment:        The GeneXpert MRSA Assay (FDA approved for NASAL specimens only), is one component of a comprehensive MRSA colonization surveillance program. It is not intended to diagnose MRSA infection nor to guide or monitor treatment for MRSA infections. Performed at [facility], [place] [address], [place], [place] [address]      Labs: Basic Metabolic Panel: Recent Labs  Lab 11/06/17 0904 11/08/17 2026 11/09/17 0752 11/10/17 0327  NA 131* 127* 131* 131*  K 4.3 4.4 4.1 4.1  CL 98 95* 101 102  CO2 23 20* 19* 20*  GLUCOSE 136* 93 82 111*  BUN _0 CREATININE 0.62 0.99 0.56* 0.53*  CALCIUM 9.0 8.7* 8.4* 7.9*  MG  --   --  2.0  --   PHOS  --   --  3.3  --    Liver Function Tests: Recent Labs  Lab 11/06/17 0904 11/08/17 2026 11/09/17 0752 11/10/17 0327  AST 75* 77* 60* 58*  ALT 57* 64* 50* 45*  ALKPHOS 467* 480* 367* 327*  BILITOT  4.5* 4.1* 4.1* 4.4*  PROT 7.5 7.4 6.8 6.6  ALBUMIN 1.9* 2.2* 2.1* 1.9*   Recent Labs  Lab 11/08/17 2026 11/09/17 0630  LIPASE 167* 62*   No results for input(s): AMMONIA in the last 168 hours. CBC: Recent Labs  Lab 11/06/17 0904 11/08/17 2026 11/09/17 1837 11/10/17 0327  WBC 11.8* 11.3* 7.2 5.7  NEUTROABS 10.1* 9.6*  --   --   HGB 10.1* 9.3* 9.5* 8.6*  HCT 31.0* 28.7* 30.5* 28.1*  MCV 93.9 93.5 96.8 97.2  PLT 301 351 284 244    CBG: Recent Labs  Lab 11/10/17 1517 11/10/17 1929 11/10/17 2124 11/11/17 0731 11/11/17 1142  GLUCAP 147* 117* 92 93 159*       Signed:  Oswald Hillock MD.  [facility] 11/11/2017, 1:24 PM

## 2017-11-11 NOTE — Care Management Note (Signed)
Case Management Note  Patient Details  Name: Matthew Lewis MRN: [id number] Date of Birth: 29-Mar-1955  Subjective/Objective:                  Positive bld cultures for klebsiella oxytoca on iv ns and iv rocephin  Action/Plan: Following for cm needs  Expected Discharge Date:  11/11/17               Expected Discharge Plan:  Home/Self Care  In-House Referral:     Discharge planning Services  CM Consult  Post Acute Care Choice:    Choice offered to:     DME Arranged:    DME Agency:     HH Arranged:    HH Agency:     Status of Service:  In process, will continue to follow  If discussed at Long Length of Stay Meetings, dates discussed:    Additional Comments:  [name], RN 11/11/2017, 10:14 AM

## 2017-11-11 NOTE — Care Management Note (Signed)
Case Management Note  Patient Details  Name: Matthew Lewis MRN: [id number] Date of Birth: January 12, 1955  Subjective/Objective:                  Discharge to home with home health care fir biliary drain care  Action/Plan: Choose [facility] notified  Expected Discharge Date:  11/11/17               Expected Discharge Plan:  [place]  In-House Referral:     Discharge planning Services  CM Consult  Post Acute Care Choice:    Choice offered to:  Patient  DME Arranged:    DME Agency:     HH Arranged:  RN [place] Agency:  [facility]  Status of Service:  Completed, signed off  If discussed at [place] of Stay Meetings, dates discussed:    Additional Comments:  [name], RN 11/11/2017, 2:30 PM

## 2017-11-14 ENCOUNTER — Ambulatory Visit (HOSPITAL_COMMUNITY): Payer: Medicare Other

## 2017-11-17 ENCOUNTER — Telehealth: Payer: Self-pay | Admitting: Nurse Practitioner

## 2017-11-18 ENCOUNTER — Telehealth: Payer: Self-pay | Admitting: Hematology

## 2017-11-18 DIAGNOSIS — K603 Anal fistula: Secondary | ICD-10-CM | POA: Diagnosis not present

## 2017-11-18 NOTE — Telephone Encounter (Signed)
tried to call regarding 11/13 per sch msg

## 2017-11-19 ENCOUNTER — Inpatient Hospital Stay: Payer: Medicare Other

## 2017-11-19 ENCOUNTER — Inpatient Hospital Stay: Payer: Medicare Other | Admitting: Hematology

## 2017-11-19 ENCOUNTER — Other Ambulatory Visit: Payer: Self-pay

## 2017-11-19 ENCOUNTER — Encounter (HOSPITAL_COMMUNITY): Payer: Medicare Other

## 2017-11-19 ENCOUNTER — Other Ambulatory Visit: Payer: Medicare Other

## 2017-11-19 NOTE — Progress Notes (Signed)
11/12/17 Tumor board.  [facility] vs hospice referral.

## 2017-11-20 ENCOUNTER — Other Ambulatory Visit: Payer: Self-pay

## 2017-11-20 ENCOUNTER — Other Ambulatory Visit: Payer: Medicare Other

## 2017-11-20 ENCOUNTER — Inpatient Hospital Stay: Payer: Medicare Other | Admitting: Nutrition

## 2017-11-20 ENCOUNTER — Encounter: Payer: Self-pay | Admitting: Hematology

## 2017-11-20 ENCOUNTER — Other Ambulatory Visit: Payer: Self-pay | Admitting: Medical

## 2017-11-20 ENCOUNTER — Inpatient Hospital Stay (HOSPITAL_BASED_OUTPATIENT_CLINIC_OR_DEPARTMENT_OTHER): Payer: Medicare Other | Admitting: Hematology

## 2017-11-20 ENCOUNTER — Emergency Department (HOSPITAL_COMMUNITY): Payer: Medicare Other

## 2017-11-20 ENCOUNTER — Inpatient Hospital Stay: Payer: Medicare Other

## 2017-11-20 ENCOUNTER — Encounter (HOSPITAL_COMMUNITY): Payer: Self-pay

## 2017-11-20 ENCOUNTER — Ambulatory Visit: Payer: Medicare Other | Admitting: Hematology

## 2017-11-20 ENCOUNTER — Inpatient Hospital Stay (HOSPITAL_COMMUNITY)
Admission: EM | Admit: 2017-11-20 | Discharge: 2017-11-24 | DRG: 682 | Disposition: A | Discharge status: Home / Self Care | Payer: Medicare Other | Attending: Internal Medicine | Admitting: Internal Medicine

## 2017-11-20 ENCOUNTER — Other Ambulatory Visit: Payer: Self-pay | Admitting: *Deleted

## 2017-11-20 VITALS — BP 86/58 | HR 92 | Temp 97.6°F | Resp 16

## 2017-11-20 VITALS — BP 81/55 | HR 104 | Resp 16 | Ht 72.0 in | Wt 203.5 lb

## 2017-11-20 DIAGNOSIS — Z9861 Coronary angioplasty status: Secondary | ICD-10-CM | POA: Diagnosis not present

## 2017-11-20 DIAGNOSIS — I10 Essential (primary) hypertension: Secondary | ICD-10-CM | POA: Diagnosis present

## 2017-11-20 DIAGNOSIS — Z885 Allergy status to narcotic agent status: Secondary | ICD-10-CM | POA: Diagnosis not present

## 2017-11-20 DIAGNOSIS — Z794 Long term (current) use of insulin: Secondary | ICD-10-CM | POA: Diagnosis not present

## 2017-11-20 DIAGNOSIS — E785 Hyperlipidemia, unspecified: Secondary | ICD-10-CM | POA: Diagnosis present

## 2017-11-20 DIAGNOSIS — K7031 Alcoholic cirrhosis of liver with ascites: Secondary | ICD-10-CM | POA: Diagnosis present

## 2017-11-20 DIAGNOSIS — S299XXA Unspecified injury of thorax, initial encounter: Secondary | ICD-10-CM | POA: Diagnosis not present

## 2017-11-20 DIAGNOSIS — Z791 Long term (current) use of non-steroidal anti-inflammatories (NSAID): Secondary | ICD-10-CM

## 2017-11-20 DIAGNOSIS — C221 Intrahepatic bile duct carcinoma: Secondary | ICD-10-CM

## 2017-11-20 DIAGNOSIS — Z6828 Body mass index (BMI) 28.0-28.9, adult: Secondary | ICD-10-CM | POA: Diagnosis not present

## 2017-11-20 DIAGNOSIS — E871 Hypo-osmolality and hyponatremia: Secondary | ICD-10-CM | POA: Diagnosis not present

## 2017-11-20 DIAGNOSIS — C7971 Secondary malignant neoplasm of right adrenal gland: Secondary | ICD-10-CM

## 2017-11-20 DIAGNOSIS — I2510 Atherosclerotic heart disease of native coronary artery without angina pectoris: Secondary | ICD-10-CM | POA: Diagnosis present

## 2017-11-20 DIAGNOSIS — E1142 Type 2 diabetes mellitus with diabetic polyneuropathy: Secondary | ICD-10-CM

## 2017-11-20 DIAGNOSIS — K7030 Alcoholic cirrhosis of liver without ascites: Secondary | ICD-10-CM | POA: Diagnosis not present

## 2017-11-20 DIAGNOSIS — N179 Acute kidney failure, unspecified: Secondary | ICD-10-CM | POA: Diagnosis not present

## 2017-11-20 DIAGNOSIS — C7970 Secondary malignant neoplasm of unspecified adrenal gland: Secondary | ICD-10-CM | POA: Diagnosis not present

## 2017-11-20 DIAGNOSIS — F4310 Post-traumatic stress disorder, unspecified: Secondary | ICD-10-CM

## 2017-11-20 DIAGNOSIS — E46 Unspecified protein-calorie malnutrition: Secondary | ICD-10-CM | POA: Diagnosis not present

## 2017-11-20 DIAGNOSIS — I959 Hypotension, unspecified: Secondary | ICD-10-CM

## 2017-11-20 DIAGNOSIS — I81 Portal vein thrombosis: Secondary | ICD-10-CM | POA: Diagnosis not present

## 2017-11-20 DIAGNOSIS — Z79899 Other long term (current) drug therapy: Secondary | ICD-10-CM | POA: Diagnosis not present

## 2017-11-20 DIAGNOSIS — Z7982 Long term (current) use of aspirin: Secondary | ICD-10-CM

## 2017-11-20 DIAGNOSIS — C779 Secondary and unspecified malignant neoplasm of lymph node, unspecified: Secondary | ICD-10-CM

## 2017-11-20 DIAGNOSIS — C787 Secondary malignant neoplasm of liver and intrahepatic bile duct: Secondary | ICD-10-CM | POA: Diagnosis present

## 2017-11-20 DIAGNOSIS — C7801 Secondary malignant neoplasm of right lung: Secondary | ICD-10-CM

## 2017-11-20 DIAGNOSIS — E875 Hyperkalemia: Secondary | ICD-10-CM | POA: Diagnosis present

## 2017-11-20 DIAGNOSIS — E860 Dehydration: Secondary | ICD-10-CM | POA: Diagnosis not present

## 2017-11-20 DIAGNOSIS — E440 Moderate protein-calorie malnutrition: Secondary | ICD-10-CM

## 2017-11-20 DIAGNOSIS — E119 Type 2 diabetes mellitus without complications: Secondary | ICD-10-CM

## 2017-11-20 DIAGNOSIS — D630 Anemia in neoplastic disease: Secondary | ICD-10-CM | POA: Diagnosis not present

## 2017-11-20 DIAGNOSIS — Z85038 Personal history of other malignant neoplasm of large intestine: Secondary | ICD-10-CM

## 2017-11-20 DIAGNOSIS — C7802 Secondary malignant neoplasm of left lung: Secondary | ICD-10-CM

## 2017-11-20 DIAGNOSIS — K831 Obstruction of bile duct: Secondary | ICD-10-CM

## 2017-11-20 DIAGNOSIS — I9589 Other hypotension: Secondary | ICD-10-CM | POA: Diagnosis not present

## 2017-11-20 DIAGNOSIS — E861 Hypovolemia: Secondary | ICD-10-CM | POA: Diagnosis not present

## 2017-11-20 DIAGNOSIS — Z66 Do not resuscitate: Secondary | ICD-10-CM | POA: Diagnosis not present

## 2017-11-20 DIAGNOSIS — Z87891 Personal history of nicotine dependence: Secondary | ICD-10-CM

## 2017-11-20 DIAGNOSIS — C772 Secondary and unspecified malignant neoplasm of intra-abdominal lymph nodes: Secondary | ICD-10-CM | POA: Diagnosis present

## 2017-11-20 DIAGNOSIS — Z9049 Acquired absence of other specified parts of digestive tract: Secondary | ICD-10-CM

## 2017-11-20 LAB — I-STAT CG4 LACTIC ACID, ED: LACTIC ACID, VENOUS: 2 mmol/L — AB (ref 0.5–1.9)

## 2017-11-20 LAB — CMP (CANCER CENTER ONLY)
ALBUMIN: 2.5 g/dL — AB (ref 3.5–5.0)
ALT: 32 U/L (ref 0–44)
AST: 41 U/L (ref 15–41)
Alkaline Phosphatase: 638 U/L — ABNORMAL HIGH (ref 38–126)
Anion gap: 14 (ref 5–15)
BUN: 58 mg/dL — AB (ref 8–23)
CHLORIDE: 95 mmol/L — AB (ref 98–111)
CO2: 15 mmol/L — AB (ref 22–32)
Calcium: 10.1 mg/dL (ref 8.9–10.3)
Creatinine: 1.71 mg/dL — ABNORMAL HIGH (ref 0.61–1.24)
GFR, EST NON AFRICAN AMERICAN: 41 mL/min — AB (ref >60)
GFR, Est AFR Am: 47 mL/min — ABNORMAL LOW (ref >60)
GLUCOSE: 182 mg/dL — AB (ref 70–99)
POTASSIUM: 5.6 mmol/L — AB (ref 3.5–5.1)
SODIUM: 124 mmol/L — AB (ref 135–145)
Total Bilirubin: 2.3 mg/dL — ABNORMAL HIGH (ref 0.3–1.2)
Total Protein: 9.1 g/dL — ABNORMAL HIGH (ref 6.5–8.1)

## 2017-11-20 LAB — CBC WITH DIFFERENTIAL (CANCER CENTER ONLY)
Abs Immature Granulocytes: 0.07 10*3/uL (ref 0.00–0.07)
BASOS ABS: 0.1 10*3/uL (ref 0.0–0.1)
Basophils Relative: 1 %
Eosinophils Absolute: 0.6 10*3/uL — ABNORMAL HIGH (ref 0.0–0.5)
Eosinophils Relative: 6 %
HCT: 33.7 % — ABNORMAL LOW (ref 39.0–52.0)
HEMOGLOBIN: 11.2 g/dL — AB (ref 13.0–17.0)
Immature Granulocytes: 1 %
LYMPHS ABS: 1.3 10*3/uL (ref 0.7–4.0)
LYMPHS PCT: 13 %
MCH: 29.6 pg (ref 26.0–34.0)
MCHC: 33.2 g/dL (ref 30.0–36.0)
MCV: 88.9 fL (ref 80.0–100.0)
MONO ABS: 0.9 10*3/uL (ref 0.1–1.0)
Monocytes Relative: 9 %
NEUTROS ABS: 7.1 10*3/uL (ref 1.7–7.7)
Neutrophils Relative %: 70 %
Platelet Count: 584 10*3/uL — ABNORMAL HIGH (ref 150–400)
RBC: 3.79 MIL/uL — AB (ref 4.22–5.81)
RDW: 15.3 % (ref 11.5–15.5)
WBC: 10.1 10*3/uL (ref 4.0–10.5)
nRBC: 0 % (ref 0.0–0.2)

## 2017-11-20 LAB — COMPREHENSIVE METABOLIC PANEL
ALT: 32 U/L (ref 0–44)
AST: 40 U/L (ref 15–41)
Albumin: 2.5 g/dL — ABNORMAL LOW (ref 3.5–5.0)
Alkaline Phosphatase: 502 U/L — ABNORMAL HIGH (ref 38–126)
Anion gap: 9 (ref 5–15)
BUN: 61 mg/dL — ABNORMAL HIGH (ref 8–23)
CHLORIDE: 99 mmol/L (ref 98–111)
CO2: 17 mmol/L — AB (ref 22–32)
CREATININE: 1.36 mg/dL — AB (ref 0.61–1.24)
Calcium: 8.8 mg/dL — ABNORMAL LOW (ref 8.9–10.3)
GFR, EST NON AFRICAN AMERICAN: 53 mL/min — AB (ref >60)
Glucose, Bld: 83 mg/dL (ref 70–99)
POTASSIUM: 5.3 mmol/L — AB (ref 3.5–5.1)
SODIUM: 125 mmol/L — AB (ref 135–145)
Total Bilirubin: 1.9 mg/dL — ABNORMAL HIGH (ref 0.3–1.2)
Total Protein: 8.4 g/dL — ABNORMAL HIGH (ref 6.5–8.1)

## 2017-11-20 LAB — URINALYSIS, ROUTINE W REFLEX MICROSCOPIC
Bilirubin Urine: NEGATIVE
Glucose, UA: 150 mg/dL — AB
Hgb urine dipstick: NEGATIVE
Ketones, ur: NEGATIVE mg/dL
Leukocytes, UA: NEGATIVE
Nitrite: NEGATIVE
Protein, ur: NEGATIVE mg/dL
Specific Gravity, Urine: 1.011 (ref 1.005–1.030)
pH: 5 (ref 5.0–8.0)

## 2017-11-20 LAB — CBC WITH DIFFERENTIAL/PLATELET
ABS IMMATURE GRANULOCYTES: 0.08 10*3/uL — AB (ref 0.00–0.07)
BASOS ABS: 0.1 10*3/uL (ref 0.0–0.1)
Basophils Relative: 1 %
Eosinophils Absolute: 0.6 10*3/uL — ABNORMAL HIGH (ref 0.0–0.5)
Eosinophils Relative: 6 %
HEMATOCRIT: 31.1 % — AB (ref 39.0–52.0)
Hemoglobin: 9.8 g/dL — ABNORMAL LOW (ref 13.0–17.0)
IMMATURE GRANULOCYTES: 1 %
LYMPHS ABS: 1.4 10*3/uL (ref 0.7–4.0)
Lymphocytes Relative: 14 %
MCH: 30.2 pg (ref 26.0–34.0)
MCHC: 31.5 g/dL (ref 30.0–36.0)
MCV: 95.7 fL (ref 80.0–100.0)
Monocytes Absolute: 1 10*3/uL (ref 0.1–1.0)
Monocytes Relative: 10 %
NEUTROS ABS: 7.2 10*3/uL (ref 1.7–7.7)
NRBC: 0 % (ref 0.0–0.2)
Neutrophils Relative %: 68 %
Platelets: 548 10*3/uL — ABNORMAL HIGH (ref 150–400)
RBC: 3.25 MIL/uL — AB (ref 4.22–5.81)
RDW: 15.9 % — ABNORMAL HIGH (ref 11.5–15.5)
WBC: 10.3 10*3/uL (ref 4.0–10.5)

## 2017-11-20 LAB — PROTIME-INR
INR: 1.22
PROTHROMBIN TIME: 15.3 s — AB (ref 11.4–15.2)

## 2017-11-20 LAB — CBC
HEMATOCRIT: 33.8 % — AB (ref 39.0–52.0)
Hemoglobin: 10.4 g/dL — ABNORMAL LOW (ref 13.0–17.0)
MCH: 29.9 pg (ref 26.0–34.0)
MCHC: 30.8 g/dL (ref 30.0–36.0)
MCV: 97.1 fL (ref 80.0–100.0)
NRBC: 0 % (ref 0.0–0.2)
Platelets: 336 10*3/uL (ref 150–400)
RBC: 3.48 MIL/uL — ABNORMAL LOW (ref 4.22–5.81)
RDW: 15.9 % — AB (ref 11.5–15.5)
WBC: 8.8 10*3/uL (ref 4.0–10.5)

## 2017-11-20 LAB — TYPE AND SCREEN
ABO/RH(D): A POS
ANTIBODY SCREEN: NEGATIVE

## 2017-11-20 LAB — AMMONIA: AMMONIA: 20 umol/L (ref 9–35)

## 2017-11-20 LAB — CREATININE, SERUM: CREATININE: 0.98 mg/dL (ref 0.61–1.24)

## 2017-11-20 LAB — GLUCOSE, CAPILLARY: Glucose-Capillary: 94 mg/dL (ref 70–99)

## 2017-11-20 LAB — LIPASE, BLOOD: LIPASE: 69 U/L — AB (ref 11–51)

## 2017-11-20 MED ORDER — SODIUM CHLORIDE 0.9 % IV BOLUS (SEPSIS)
1000.0000 mL | Freq: Once | INTRAVENOUS | Status: AC
  Administered 2017-11-20: 1000 mL via INTRAVENOUS

## 2017-11-20 MED ORDER — HEPARIN SODIUM (PORCINE) 5000 UNIT/ML IJ SOLN
5000.0000 [IU] | Freq: Two times a day (BID) | INTRAMUSCULAR | Status: DC
  Administered 2017-11-20 – 2017-11-22 (×4): 5000 [IU] via SUBCUTANEOUS
  Filled 2017-11-20 (×6): qty 1

## 2017-11-20 MED ORDER — VANCOMYCIN HCL IN DEXTROSE 1-5 GM/200ML-% IV SOLN
1000.0000 mg | Freq: Once | INTRAVENOUS | Status: AC
  Administered 2017-11-20: 1000 mg via INTRAVENOUS
  Filled 2017-11-20 (×2): qty 200

## 2017-11-20 MED ORDER — VANCOMYCIN HCL 10 G IV SOLR: 1500.0000 mg | INTRAVENOUS | Status: DC

## 2017-11-20 MED ORDER — OXYCODONE HCL 5 MG PO TABS
10.0000 mg | ORAL_TABLET | ORAL | Status: DC | PRN
  Administered 2017-11-21 – 2017-11-22 (×3): 10 mg via ORAL
  Filled 2017-11-20 (×3): qty 2

## 2017-11-20 MED ORDER — VANCOMYCIN HCL IN DEXTROSE 1-5 GM/200ML-% IV SOLN
1000.0000 mg | Freq: Once | INTRAVENOUS | Status: AC
  Administered 2017-11-20: 1000 mg via INTRAVENOUS
  Filled 2017-11-20: qty 200

## 2017-11-20 MED ORDER — METRONIDAZOLE IN NACL 5-0.79 MG/ML-% IV SOLN
500.0000 mg | Freq: Three times a day (TID) | INTRAVENOUS | Status: DC
  Administered 2017-11-20 – 2017-11-22 (×6): 500 mg via INTRAVENOUS
  Filled 2017-11-20 (×7): qty 100

## 2017-11-20 MED ORDER — SODIUM CHLORIDE 0.9 % IV SOLN
2.0000 g | Freq: Once | INTRAVENOUS | Status: AC
  Administered 2017-11-20: 2 g via INTRAVENOUS
  Filled 2017-11-20: qty 2

## 2017-11-20 MED ORDER — SODIUM CHLORIDE 0.9 % IV SOLN
2.0000 g | Freq: Two times a day (BID) | INTRAVENOUS | Status: DC
  Administered 2017-11-20: 2 g via INTRAVENOUS
  Filled 2017-11-20: qty 2

## 2017-11-20 MED ORDER — ENSURE ENLIVE PO LIQD
237.0000 mL | Freq: Two times a day (BID) | ORAL | Status: DC
  Administered 2017-11-21 – 2017-11-23 (×5): 237 mL via ORAL

## 2017-11-20 MED ORDER — ACETAMINOPHEN 650 MG RE SUPP: 650.0000 mg | Freq: Four times a day (QID) | RECTAL | Status: DC | PRN

## 2017-11-20 MED ORDER — SODIUM CHLORIDE 0.9 % IV SOLN
1.0000 g | Freq: Three times a day (TID) | INTRAVENOUS | Status: DC
  Administered 2017-11-21 – 2017-11-22 (×5): 1 g via INTRAVENOUS
  Filled 2017-11-20 (×6): qty 1

## 2017-11-20 MED ORDER — INSULIN ASPART 100 UNIT/ML ~~LOC~~ SOLN
0.0000 [IU] | Freq: Three times a day (TID) | SUBCUTANEOUS | Status: DC
  Administered 2017-11-22 – 2017-11-24 (×3): 1 [IU] via SUBCUTANEOUS

## 2017-11-20 MED ORDER — SODIUM CHLORIDE 0.9 % IV BOLUS
1000.0000 mL | Freq: Once | INTRAVENOUS | Status: AC
  Administered 2017-11-20: 1000 mL via INTRAVENOUS

## 2017-11-20 MED ORDER — POLYVINYL ALCOHOL 1.4 % OP SOLN: 1.0000 [drp] | Freq: Every day | OPHTHALMIC | Status: DC | PRN

## 2017-11-20 MED ORDER — CELECOXIB 200 MG PO CAPS
200.0000 mg | ORAL_CAPSULE | Freq: Every day | ORAL | Status: DC
  Administered 2017-11-20 – 2017-11-23 (×4): 200 mg via ORAL
  Filled 2017-11-20 (×4): qty 1

## 2017-11-20 MED ORDER — GABAPENTIN 400 MG PO CAPS
800.0000 mg | ORAL_CAPSULE | Freq: Every day | ORAL | Status: DC
  Administered 2017-11-20 – 2017-11-23 (×5): 800 mg via ORAL
  Filled 2017-11-20 (×6): qty 2

## 2017-11-20 MED ORDER — SODIUM CHLORIDE 0.9 % IV SOLN
INTRAVENOUS | Status: DC
  Administered 2017-11-20: 100 mL/h via INTRAVENOUS
  Administered 2017-11-21 – 2017-11-23 (×4): via INTRAVENOUS

## 2017-11-20 MED ORDER — SODIUM CHLORIDE 0.9 % IV SOLN
INTRAVENOUS | Status: DC
  Administered 2017-11-20: 10:00:00 via INTRAVENOUS
  Filled 2017-11-20 (×2): qty 250

## 2017-11-20 MED ORDER — SODIUM CHLORIDE 0.9 % IV SOLN
Freq: Once | INTRAVENOUS | Status: AC
  Administered 2017-11-20: 15:00:00 via INTRAVENOUS

## 2017-11-20 MED ORDER — ATORVASTATIN CALCIUM 20 MG PO TABS
20.0000 mg | ORAL_TABLET | Freq: Every day | ORAL | Status: DC
  Administered 2017-11-21 – 2017-11-23 (×3): 20 mg via ORAL
  Filled 2017-11-20 (×3): qty 1

## 2017-11-20 MED ORDER — ASPIRIN EC 81 MG PO TBEC
81.0000 mg | DELAYED_RELEASE_TABLET | Freq: Every day | ORAL | Status: DC
  Administered 2017-11-21 – 2017-11-24 (×4): 81 mg via ORAL
  Filled 2017-11-20 (×5): qty 1

## 2017-11-20 MED ORDER — CALCIUM CARBONATE ANTACID 500 MG PO CHEW
1.0000 | CHEWABLE_TABLET | Freq: Two times a day (BID) | ORAL | Status: DC | PRN
  Administered 2017-11-22: 200 mg via ORAL
  Filled 2017-11-20: qty 1

## 2017-11-20 MED ORDER — ACETAMINOPHEN 325 MG PO TABS
650.0000 mg | ORAL_TABLET | Freq: Four times a day (QID) | ORAL | Status: DC | PRN
  Administered 2017-11-21 – 2017-11-23 (×2): 650 mg via ORAL
  Filled 2017-11-20 (×2): qty 2

## 2017-11-20 NOTE — Progress Notes (Signed)
Spoke with Merleen Nicely RN Infusion, no chemotherapy today, patient will receive IVF NS over 4 hours, IV antibiotic, needs Blood Culture, and culture of fluid from drain, monitor BP    2:30 pm report called to [facility] ED, patient will be taken via wheelchair by infusion nurse to recess A

## 2017-11-20 NOTE — ED Notes (Signed)
ED TO INPATIENT HANDOFF REPORT  Name/Age/Gender Matthew Lewis 62 y.o. male  Code Status    Code Status Orders  (From admission, onward)         Start     Ordered   11/20/17 1928  Full code  Continuous     11/20/17 1931        Code Status History    Date Active Date Inactive Code Status Order ID Comments User Context   11/09/2017 0733 11/11/2017 1844 DNR [id number]  Toy Baker, MD ED   10/23/2017 2256 10/29/2017 1951 Full Code [id number]  Etta Quill, DO ED   02/24/2017 1951 02/25/2017 1725 DNR [id number]  Karmen Bongo, MD Inpatient   06/12/2016 2021 06/14/2016 2149 Full Code [id number]  Vianne Bulls, MD ED   11/30/2012 2030 12/02/2012 2057 Full Code [id number]  Louellen Molder, MD Inpatient   08/15/2011 1827 08/19/2011 1821 Full Code [id number]  Kathalene Frames, MD ED   06/05/2011 1927 06/08/2011 1410 Full Code [id number]  Whitaker, Marilynn Rail, RN Inpatient      Home/SNF/Other Home  Chief Complaint Cancer Pt  Level of Care/Admitting Diagnosis ED Disposition    ED Disposition Condition [place]: [facility] [100102]  Level of Care: Telemetry [5]  Admit to tele based on following criteria: Other see comments  Comments: Hypotension, acute renal failure  Diagnosis: Hypotension [017494]  Admitting Physician: Yaakov Guthrie [[id number]]  Attending Physician: Yaakov Guthrie [[id number]]  Estimated length of stay: 3 - 4 days  Certification:: I certify this patient will need inpatient services for at least 2 midnights  PT Class (Do Not Modify): Inpatient [101]  PT Acc Code (Do Not Modify): Private [1]       Medical History Past Medical History:  Diagnosis Date  . Abscess of anal and rectal regions    horse shoe abscess  . Alcoholic cirrhosis ([place])   . Arthritis    "everywhere; especially in my spine"  . Cirrhosis ([place])    STAGE 1 CIRRHOSIS PATIENT SEES DR NAMDIGAM FOR  . Colon cancer ([place]) 05/2005   TX SURGERY WITH LYMPH  NODE REMOVAL  . Cough    LAST FEW WEEKS SAW DR RAMOS LUNGS MILKY SPUTUM OCC  . DM type 2 with diabetic peripheral neuropathy ([facility])    left foot  . Fatigue   . Fistula, anal    multiple  . History of substance abuse ([place])    last alcohol was 08/2011; last marijuania was 08/2011  . Hypertension   . PTSD (post-traumatic stress disorder)    "severe" HX OF  . Sleep deprivation     Allergies Allergies  Allergen Reactions  . Morphine And Related Other (See Comments)    PATIENT IS RECOVERING FROM DRUG ADDICTION PATIENT WANTS TO AVOID ANY NARCOTICS  . Other Other (See Comments)    PATIENT IS RECOVERING FROM DRUG ADDICTION PATIENT WANTS TO AVOID ANY NARCOTICS    IV Location/Drains/Wounds Patient Lines/Drains/Airways Status   Active Line/Drains/Airways    Name:   Placement date:   Placement time:   Site:   Days:   Peripheral IV 11/20/17 Left Hand   11/20/17    1004    Hand   less than 1   Peripheral IV 11/20/17 Left;Upper Arm   11/20/17    1516    Arm   less than 1   Biliary Tube Cook slip-coat 10.2 Fr. RUQ   11/07/17    1707  RUQ   13   Incision (Closed) 02/24/17 Rectum   02/24/17    1812     269          Labs/Imaging Results for orders placed or performed during the hospital encounter of 11/20/17 (from the past 48 hour(s))  Comprehensive metabolic panel     Status: Abnormal   Collection Time: 11/20/17  3:00 PM  Result Value Ref Range   Sodium 125 (L) 135 - 145 mmol/L   Potassium 5.3 (H) 3.5 - 5.1 mmol/L   Chloride 99 98 - 111 mmol/L   CO2 17 (L) 22 - 32 mmol/L   Glucose, Bld 83 70 - 99 mg/dL   BUN 61 (H) 8 - 23 mg/dL   Creatinine, Ser 1.36 (H) 0.61 - 1.24 mg/dL   Calcium 8.8 (L) 8.9 - 10.3 mg/dL   Total Protein 8.4 (H) 6.5 - 8.1 g/dL   Albumin 2.5 (L) 3.5 - 5.0 g/dL   AST 40 15 - 41 U/L   ALT 32 0 - 44 U/L   Alkaline Phosphatase 502 (H) 38 - 126 U/L   Total Bilirubin 1.9 (H) 0.3 - 1.2 mg/dL   GFR calc non Af Amer 53 (L) >60 mL/min   GFR calc Af Amer >60 >60 mL/min     Comment: (NOTE) The eGFR has been calculated using the CKD EPI equation. This calculation has not been validated in all clinical situations. eGFR's persistently <60 mL/min signify possible Chronic Kidney Disease.    Anion gap 9 5 - 15    Comment: Performed at [facility], [place] [address] [place], [place] [address]  CBC WITH DIFFERENTIAL     Status: Abnormal   Collection Time: 11/20/17  3:00 PM  Result Value Ref Range   WBC 10.3 4.0 - 10.5 K/uL   RBC 3.25 (L) 4.22 - 5.81 MIL/uL   Hemoglobin 9.8 (L) 13.0 - 17.0 g/dL   HCT 31.1 (L) 39.0 - 52.0 %   MCV 95.7 80.0 - 100.0 fL   MCH 30.2 26.0 - 34.0 pg   MCHC 31.5 30.0 - 36.0 g/dL   RDW 15.9 (H) 11.5 - 15.5 %   Platelets 548 (H) 150 - 400 K/uL   nRBC 0.0 0.0 - 0.2 %   Neutrophils Relative % 68 %   Neutro Abs 7.2 1.7 - 7.7 K/uL   Lymphocytes Relative 14 %   Lymphs Abs 1.4 0.7 - 4.0 K/uL   Monocytes Relative 10 %   Monocytes Absolute 1.0 0.1 - 1.0 K/uL   Eosinophils Relative 6 %   Eosinophils Absolute 0.6 (H) 0.0 - 0.5 K/uL   Basophils Relative 1 %   Basophils Absolute 0.1 0.0 - 0.1 K/uL   Immature Granulocytes 1 %   Abs Immature Granulocytes 0.08 (H) 0.00 - 0.07 K/uL    Comment: Performed at [facility], [place] [address], [place], [place] [address]  Lipase, blood     Status: Abnormal   Collection Time: 11/20/17  3:00 PM  Result Value Ref Range   Lipase 69 (H) 11 - 51 U/L    Comment: Performed at [facility], [place] [address] [place], [place] [address]  Protime-INR     Status: Abnormal   Collection Time: 11/20/17  3:00 PM  Result Value Ref Range   Prothrombin Time 15.3 (H) 11.4 - 15.2 seconds   INR 1.22     Comment: Performed at [facility], [place] [address], [place], [place] [address]  I-Stat CG4 Lactic Acid, ED     Status: Abnormal   Collection Time: 11/20/17  3:16 PM  Result Value Ref Range   Lactic Acid, Venous 2.00 (HH) 0.5 - 1.9 mmol/L    Comment NOTIFIED PHYSICIAN   Ammonia     Status: None   Collection Time: 11/20/17  4:37 PM  Result Value Ref Range   Ammonia 20 9 - 35 umol/L    Comment: Performed at [facility], [place] [address] [place], [place] [address]  Urinalysis, Routine w reflex microscopic     Status: Abnormal   Collection Time: 11/20/17  4:53 PM  Result Value Ref Range   Color, Urine YELLOW YELLOW   APPearance HAZY (A) CLEAR   Specific Gravity, Urine 1.011 1.005 - 1.030   pH 5.0 5.0 - 8.0   Glucose, UA 150 (A) NEGATIVE mg/dL   Hgb urine dipstick NEGATIVE NEGATIVE   Bilirubin Urine NEGATIVE NEGATIVE   Ketones, ur NEGATIVE NEGATIVE mg/dL   Protein, ur NEGATIVE NEGATIVE mg/dL   Nitrite NEGATIVE NEGATIVE   Leukocytes, UA NEGATIVE NEGATIVE    Comment: Performed at [place] [address] [place], [place] [address]  Type and screen [place]     Status: None   Collection Time: 11/20/17  4:57 PM  Result Value Ref Range   ABO/RH(D) A POS    Antibody Screen NEG    Sample Expiration      11/23/2017 Performed at [facility], [place] [address] [place], [place] [address]    Dg Chest Port 1 View  Result Date: 11/20/2017 CLINICAL DATA:  62 y/o M; metastatic right adrenal gland cancer. Weakness with onset yesterday and fall. EXAM: PORTABLE CHEST 1 VIEW COMPARISON:  11/08/2017 chest radiograph. FINDINGS: The heart size and mediastinal contours are within normal limits. Both lungs are clear. ACDF hardware partially visualized. No acute osseous abnormality is evident. IMPRESSION: No active disease. Electronically Signed   By: Kristine Garbe M.D.   On: 11/20/2017 16:00   EKG Interpretation  Date/Time:  Thursday November 20 2017 14:49:08 EST Ventricular Rate:  90 PR Interval:    QRS Duration: 118 QT Interval:  376 QTC Calculation: 461 R Axis:   85 Text Interpretation:  Sinus rhythm Short PR interval Nonspecific  intraventricular conduction delay Since last tracing rate slower Confirmed by Dorie Rank [phone]) on 11/20/2017 3:39:51 PM Also confirmed by Dorie Rank [phone]), editor Shon Hale [phone])  on 11/20/2017 4:14:06 PM   Pending Labs Unresulted Labs (From admission, onward)    Start     Ordered   11/21/17 0500  Comprehensive metabolic panel  Tomorrow morning,   R     11/20/17 1931   11/21/17 0500  Creatinine, serum  Daily,   R     11/20/17 1948   11/20/17 1929  CBC  (heparin)  Once,   R    Comments:  Baseline for heparin therapy IF NOT ALREADY DRAWN.  Notify MD if PLT < 100 K.    11/20/17 1931   11/20/17 1929  Creatinine, serum  (heparin)  Once,   R    Comments:  Baseline for heparin therapy IF NOT ALREADY DRAWN.    11/20/17 1931   11/20/17 1653  ABO/Rh  Once,   R     11/20/17 1653   11/20/17 1535  Blood Culture (routine x 2)  BLOOD CULTURE X 2,   STAT     11/20/17 1536  Vitals/Pain Today's Vitals   11/20/17 1645 11/20/17 1700 11/20/17 1730 11/20/17 1745  BP: 101/61 99/64 (!) 96/56 (!) 102/58  Pulse: 92 93 92 99  Resp: '18 17 16 16  ' Temp:      TempSrc:      SpO2: 100% 100% 99% 100%  PainSc:        Isolation Precautions No active isolations  Medications Medications  metroNIDAZOLE (FLAGYL) IVPB 500 mg (0 mg Intravenous Stopped 11/20/17 2013)  heparin injection 5,000 Units (has no administration in time range)  0.9 %  sodium chloride infusion (has no administration in time range)  acetaminophen (TYLENOL) tablet 650 mg (has no administration in time range)    Or  acetaminophen (TYLENOL) suppository 650 mg (has no administration in time range)  vancomycin (VANCOCIN) IVPB 1000 mg/200 mL premix (has no administration in time range)  vancomycin (VANCOCIN) 1,500 mg in sodium chloride 0.9 % 500 mL IVPB (has no administration in time range)  ceFEPIme (MAXIPIME) 1 g in sodium chloride 0.9 % 100 mL IVPB (has no administration in time range)  sodium chloride 0.9 % bolus  1,000 mL (0 mLs Intravenous Stopped 11/20/17 1700)  0.9 %  sodium chloride infusion ( Intravenous New Bag/Given 11/20/17 1519)  sodium chloride 0.9 % bolus 1,000 mL (0 mLs Intravenous Stopped 11/20/17 1800)  ceFEPIme (MAXIPIME) 2 g in sodium chloride 0.9 % 100 mL IVPB ( Intravenous Stopped 11/20/17 1741)  vancomycin (VANCOCIN) IVPB 1000 mg/200 mL premix (0 mg Intravenous Stopped 11/20/17 2012)    Mobility walks

## 2017-11-20 NOTE — Progress Notes (Signed)
62 year old male diagnosed with metastatic right adrenal gland cancer.   He is a patient of [name].  Past medical history includes colon cancer in 9012, alcoholic cirrhosis, hypertension, substance abuse, and diabetes type 2.  Medications include Lipitor, Glucotrol, Glucophage, multivitamin, Zofran, and Compazine.  Labs include sodium 131, glucose 136, and albumin 1.9 on October 31.  Height: 6 feet 0 inches. Weight: 203.5 pounds. Usual body weight: 235 pounds per patient. BMI: 27.6.  Patient denies nutrition impact symptoms. He reports he likes to cook and eats primarily a vegetarian diet. He is not opposed to meat and will occasionally choose chicken or fish.   He is drinking 1 original boost daily. He reports 1-2 loose stools a day. Patient has lost 7% body weight over 2 weeks and 13% body weight over 3 months. Patient reports he currently is eating well. Patient was diagnosed with moderate malnutrition in the context of chronic illness during recent hospitalization.  Nutrition diagnosis: Unintended weight loss related to metastatic cancer as evidenced by approximate 30 pound weight loss from usual body weight.  Intervention: Patient was educated to consume small frequent meals and snacks utilizing high-calorie high-protein foods. Reviewed a variety of protein choices for patient. Recommended patient increase boost to boost plus twice daily.  Provided coupons. Encourage patient to strive for weight maintenance. Fact sheets were given.  Questions were answered.  Teach back method used.  Contact information provided.  Monitoring, evaluation, goals: Patient will tolerate adequate calories and protein for weight maintenance.  Next visit: To be scheduled as needed with treatment.  **Disclaimer: This note was dictated with voice recognition software. Similar sounding words can inadvertently be transcribed and this note may contain transcription errors which may not have been  corrected upon publication of note.**

## 2017-11-20 NOTE — ED Notes (Signed)
Bed: RESA Expected date:  Expected time:  Means of arrival:  Comments: Cancer pt/ hypotensive

## 2017-11-20 NOTE — Progress Notes (Signed)
A consult was received from an ED physician for vanc/cefepime per pharmacy dosing.  The patient's profile has been reviewed for ht/wt/allergies/indication/available labs.   A one time order has been placed for vanc 1g and cefepime 2g.  Further antibiotics/pharmacy consults should be ordered by admitting physician if indicated.                       Thank you, Kara Mead 11/20/2017  4:31 PM

## 2017-11-20 NOTE — ED Provider Notes (Signed)
[facility] DEPT Provider Note   CSN: [id number] Arrival date & time: 11/20/17  1439     History   Chief Complaint Chief Complaint  Patient presents with  . Hypotension    HPI Matthew Lewis is a 62 y.o. male.  HPI Pt presents to the ED for hypotension.  Patient has a history of intrahepatic cholangiocarcinoma.  He was recent the hospital for sepsis, segmental colitis and obstructive jaundice.  Patient was at the cancer center today to receive a chemotherapy treatment.  Patient has been having some generalized malaise.  In the cancer center the patient was noted to have a low blood pressure and was sent to the ED for further treatment.  Patient denies any abdominal pain.  He denies any fevers or chills.  No vomiting or diarrhea.  He continues to have adequate drainage from his drain Past Medical History:  Diagnosis Date  . Abscess of anal and rectal regions    horse shoe abscess  . Alcoholic cirrhosis ([place])   . Arthritis    "everywhere; especially in my spine"  . Cirrhosis ([place])    STAGE 1 CIRRHOSIS PATIENT SEES DR NAMDIGAM FOR  . Colon cancer ([place]) 05/2005   TX SURGERY WITH LYMPH NODE REMOVAL  . Cough    LAST FEW WEEKS SAW DR RAMOS LUNGS MILKY SPUTUM OCC  . DM type 2 with diabetic peripheral neuropathy (HCC)    left foot  . Fatigue   . Fistula, anal    multiple  . History of substance abuse ([place])    last alcohol was 08/2011; last marijuania was 08/2011  . Hypertension   . PTSD (post-traumatic stress disorder)    "severe" HX OF  . Sleep deprivation     Patient Active Problem List   Diagnosis Date Noted  . Palliative care by specialist   . Segmental colitis ([place]) 11/09/2017  . Elevated lipase 11/09/2017  . Intrahepatic cholangiocarcinoma ([place]) 11/06/2017  . Goals of care, counseling/discussion 11/06/2017  . Cirrhosis ([place])   . Malignant neoplasm metastatic to adrenal gland ([place])   . Obstructive jaundice 10/23/2017  .  Transaminitis 10/23/2017  . Liver lesion 10/23/2017  . History of colon cancer 10/23/2017  . Perirectal abscess 02/24/2017  . Moderate protein-calorie malnutrition ([place]) 02/24/2017  . Sepsis ([place]) 02/24/2017  . Pulmonary nodule 02/24/2017  . Alcoholic cirrhosis of liver without ascites ([place]) 02/24/2017  . Duodenitis 06/13/2016  . Hyponatremia 06/12/2016  . Nausea & vomiting 06/12/2016  . Thrombocytopenia ([place]) 06/12/2016  . Acute gastroenteritis 06/12/2016  . PTSD (post-traumatic stress disorder) 12/02/2012  . Hx of substance abuse- clean several years now 12/02/2012  . Chest pain with moderate risk of acute coronary syndrome 11/30/2012  . Obesity 11/30/2012  . Hypertension 11/30/2012  . Hyperlipemia 11/30/2012  . Diabetes mellitus ([place]) 11/30/2012  . CAD (coronary artery disease)- PCI 2001- no evaluation since 11/30/2012    Past Surgical History:  Procedure Laterality Date  . ABSCESS DRAINAGE     "probably 15-20 so far at least; rectal"  . ANAL FISTULECTOMY  12/13/1998  . ANTERIOR FUSION CERVICAL SPINE  2010   "triple"; "did 2 surgeries on the same day in 1314 due to complications"  . CORONARY ANGIOPLASTY  2001  . detached muscle  2010   right chest; "after cervical fusion complications"  . ELBOW SURGERY  2007   "cut out part of a muscle"; left  . EXAMINATION UNDER ANESTHESIA  03/22/2005   fistula  . EXAMINATION UNDER ANESTHESIA  06/06/2011   Procedure: EXAM UNDER ANESTHESIA;  Surgeon: Stark Klein, MD;  Location: [facility];  Service: General;  Laterality: N/A;  . HEMICOLECTOMY  06/06/2005   left  . INCISION AND DRAINAGE PERIRECTAL ABSCESS  03/30/2005  . INCISION AND DRAINAGE PERIRECTAL ABSCESS  12/13/2004  . INCISION AND DRAINAGE PERIRECTAL ABSCESS  07/14/2001  . INCISION AND DRAINAGE PERIRECTAL ABSCESS  08/12/2010   horseshoe abscess; Dr Redmond Pulling  . INCISION AND DRAINAGE PERIRECTAL ABSCESS  06/06/2011   Procedure: IRRIGATION AND DEBRIDEMENT PERIRECTAL ABSCESS;  Surgeon: Stark Klein, MD;  Location: [facility];  Service: General;  Laterality: N/A;  . INCISION AND DRAINAGE PERIRECTAL ABSCESS N/A 02/24/2017   Procedure: IRRIGATION AND DEBRIDEMENT PERIRECTAL ABSCESS;  Surgeon: Ralene Ok, MD;  Location: [facility];  Service: General;  Laterality: N/A;  . IR EXCHANGE BILIARY DRAIN  11/07/2017  . IR INT EXT BILIARY DRAIN WITH CHOLANGIOGRAM  10/26/2017  . IR RADIOLOGIST EVAL & MGMT  11/06/2017  . [place]  . PERCUTANEOUS PINNING PHALANX FRACTURE OF HAND  ~ 2008   "plates in 2 places"; right  . PLACEMENT OF SETON N/A 09/05/2017   Procedure: PLACEMENT OF SETON;  Surgeon: Leighton Ruff, MD;  Location: [facility];  Service: General;  Laterality: N/A;  . RADIOLOGY WITH ANESTHESIA N/A 10/25/2017   Procedure: MRI WITH ANESTHESIA;  Surgeon: Radiologist, Medication, MD;  Location: [facility];  Service: Radiology;  Laterality: N/A;  . THORACIC DISCECTOMY  1990's        Home Medications    Prior to Admission medications   Medication Sig Start Date End Date Taking? Authorizing Provider  aspirin EC 81 MG tablet Take 81 mg by mouth daily.   Yes [provider]  atorvastatin (LIPITOR) 40 MG tablet Take 1 tablet (40 mg total) by mouth daily at 6 PM. Patient taking differently: Take 20 mg by mouth daily at 6 PM.  12/02/12  Yes Viyuoh, Adeline C, MD  calcium carbonate (TUMS - DOSED IN MG ELEMENTAL CALCIUM) 500 MG chewable tablet Chew 1 tablet by mouth 2 (two) times daily as needed for indigestion or heartburn.   Yes [provider]  celecoxib (CELEBREX) 200 MG capsule Take 1 capsule by mouth at bedtime.  10/10/17  Yes [provider]  empagliflozin (JARDIANCE) 10 MG TABS tablet Take 10 mg by mouth at bedtime.    Yes [provider]  Fexofenadine-Pseudoephedrine (ALLEGRA-D 24 HOUR PO) Take 1 tablet by mouth every evening. WAL/FEX    Yes [provider]  gabapentin (NEURONTIN) 400 MG capsule Take 400-800 mg by mouth 2  (two) times daily. 1 capsule am, 2 capsules pm   Yes [provider]  glipiZIDE (GLUCOTROL) 5 MG tablet Take 5 mg by mouth 2 (two) times daily before a meal.   Yes [provider]  Insulin Detemir (LEVEMIR FLEXPEN) 100 UNIT/ML Pen Inject 10-60 Units into the skin 2 (two) times daily. Take 62 units in the am and Take 10 units in the pm   Yes [provider]  lisinopril (PRINIVIL,ZESTRIL) 20 MG tablet Take 10 mg by mouth daily.    Yes [provider]  metFORMIN (GLUCOPHAGE) 1000 MG tablet Take 1,000 mg by mouth 2 (two) times daily with a meal.   Yes [provider]  Multiple Vitamin (MULTIVITAMIN) tablet Take 1 tablet by mouth daily.   Yes [provider]  oxyCODONE 10 MG TABS Take 1 tablet (10 mg total) by mouth every 4 (four) hours  as needed for severe pain. 10/29/17  Yes Nita Sells, MD  ALPRAZolam Duanne Moron) 0.5 MG tablet Take 1 tablet 1 hour before PET scan Patient not taking: Reported on 11/20/2017 11/07/17   Alla Feeling, NP  ondansetron (ZOFRAN) 8 MG tablet Take 1 tablet (8 mg total) by mouth 2 (two) times daily as needed. Start on the third day after chemotherapy. 11/06/17   [name], MD  polyvinyl alcohol (LIQUIFILM TEARS) 1.4 % ophthalmic solution Place 1 drop into both eyes daily as needed for dry eyes.     [provider]  prochlorperazine (COMPAZINE) 10 MG tablet Take 1 tablet (10 mg total) by mouth every 6 (six) hours as needed (Nausea or vomiting). 11/06/17   [name], MD    Family History Family History  Problem Relation Age of Onset  . Heart disease Father   . Heart disease Mother   . Cancer Mother   . Cancer Sister   . Heart disease Brother     Social History Social History   Tobacco Use  . Smoking status: Former Smoker    Packs/day: 1.00    Years: 10.00    Pack years: 10.00    Types: Cigarettes    Last attempt to quit: 01/08/1991    Years since quitting: 26.8  . Smokeless tobacco: Never Used    Substance Use Topics  . Alcohol use: Yes    Comment: 06/05/11 "drank enough when I was younger to last me my whole life; don't remember when I had my last drink, maybe 2012"  . Drug use: Not Currently    Types: Oxycodone, Marijuana    Comment: 06/05/11 "pot head in my younger days; 2009 dr had me on oxycodone and valium for almost 1yr     Allergies   Morphine and related and Other   Review of Systems Review of Systems  All other systems reviewed and are negative.    Physical Exam Updated Vital Signs BP 99/64   Pulse 93   Temp (!) 97.5 F (36.4 C) (Oral)   Resp 17   SpO2 100%   Physical Exam  Constitutional: No distress.  HENT:  Head: Normocephalic and atraumatic.  Right Ear: External ear normal.  Left Ear: External ear normal.  Eyes: Conjunctivae are normal. Right eye exhibits no discharge. Left eye exhibits no discharge. No scleral icterus.  Neck: Neck supple. No tracheal deviation present.  Cardiovascular: Normal rate, regular rhythm and intact distal pulses.  Pulmonary/Chest: Effort normal and breath sounds normal. No stridor. No respiratory distress. He has no wheezes. He has no rales.  Abdominal: Soft. Bowel sounds are normal. He exhibits no distension. There is no tenderness. There is no rebound and no guarding.  Biliary drain draining clear yellow fluid, no erythema around the catheter site  Musculoskeletal: He exhibits no edema or tenderness.  Neurological: He is alert. He has normal strength. No cranial nerve deficit (no facial droop, extraocular movements intact, no slurred speech) or sensory deficit. He exhibits normal muscle tone. He displays no seizure activity. Coordination normal.  Skin: Skin is warm and dry. No rash noted. He is not diaphoretic.  Psychiatric: He has a normal mood and affect.  Nursing note and vitals reviewed.    ED Treatments / Results  Labs (all labs ordered are listed, but only abnormal results are displayed) Labs Reviewed   URINALYSIS, ROUTINE W REFLEX MICROSCOPIC - Abnormal; Notable for the following components:      Result Value   APPearance HAZY (*)  Glucose, UA 150 (*)    All other components within normal limits  COMPREHENSIVE METABOLIC PANEL - Abnormal; Notable for the following components:   Sodium 125 (*)    Potassium 5.3 (*)    CO2 17 (*)    BUN 61 (*)    Creatinine, Ser 1.36 (*)    Calcium 8.8 (*)    Total Protein 8.4 (*)    Albumin 2.5 (*)    Alkaline Phosphatase 502 (*)    Total Bilirubin 1.9 (*)    GFR calc non Af Amer 53 (*)    All other components within normal limits  CBC WITH DIFFERENTIAL/PLATELET - Abnormal; Notable for the following components:   RBC 3.25 (*)    Hemoglobin 9.8 (*)    HCT 31.1 (*)    RDW 15.9 (*)    Platelets 548 (*)    Eosinophils Absolute 0.6 (*)    Abs Immature Granulocytes 0.08 (*)    All other components within normal limits  LIPASE, BLOOD - Abnormal; Notable for the following components:   Lipase 69 (*)    All other components within normal limits  PROTIME-INR - Abnormal; Notable for the following components:   Prothrombin Time 15.3 (*)    All other components within normal limits  I-STAT CG4 LACTIC ACID, ED - Abnormal; Notable for the following components:   Lactic Acid, Venous 2.00 (*)    All other components within normal limits  CULTURE, BLOOD (ROUTINE X 2)  CULTURE, BLOOD (ROUTINE X 2)  AMMONIA  TYPE AND SCREEN    EKG EKG Interpretation  Date/Time:  Thursday November 20 2017 14:49:08 EST Ventricular Rate:  90 PR Interval:    QRS Duration: 118 QT Interval:  376 QTC Calculation: 461 R Axis:   85 Text Interpretation:  Sinus rhythm Short PR interval Nonspecific intraventricular conduction delay Since last tracing rate slower Confirmed by Dorie Rank [phone]) on 11/20/2017 3:39:51 PM Also confirmed by Dorie Rank [phone]), editor Shon Hale [phone])  on 11/20/2017 4:14:06 PM   Radiology Dg Chest Port 1 View  Result Date:  11/20/2017 CLINICAL DATA:  62 y/o M; metastatic right adrenal gland cancer. Weakness with onset yesterday and fall. EXAM: PORTABLE CHEST 1 VIEW COMPARISON:  11/08/2017 chest radiograph. FINDINGS: The heart size and mediastinal contours are within normal limits. Both lungs are clear. ACDF hardware partially visualized. No acute osseous abnormality is evident. IMPRESSION: No active disease. Electronically Signed   By: Kristine Garbe M.D.   On: 11/20/2017 16:00    Procedures .Critical Care Performed by: Dorie Rank, MD Authorized by: Dorie Rank, MD   Critical care provider statement:    Critical care time (minutes):  35   Critical care start time:  11/20/2017 5:57 PM   Critical care was time spent personally by me on the following activities:  Discussions with consultants, evaluation of patient's response to treatment, examination of patient, ordering and performing treatments and interventions, ordering and review of laboratory studies, ordering and review of radiographic studies, pulse oximetry, re-evaluation of patient's condition, obtaining history from patient or surrogate and review of old charts   (including critical care time)  Medications Ordered in ED Medications  metroNIDAZOLE (FLAGYL) IVPB 500 mg (has no administration in time range)  vancomycin (VANCOCIN) IVPB 1000 mg/200 mL premix (has no administration in time range)  sodium chloride 0.9 % bolus 1,000 mL (0 mLs Intravenous Stopped 11/20/17 1700)  0.9 %  sodium chloride infusion ( Intravenous New Bag/Given 11/20/17 1519)  sodium chloride 0.9 %  bolus 1,000 mL (1,000 mLs Intravenous New Bag/Given 11/20/17 1635)  ceFEPIme (MAXIPIME) 2 g in sodium chloride 0.9 % 100 mL IVPB (2 g Intravenous New Bag/Given 11/20/17 1706)     Initial Impression / Assessment and Plan / ED Course  I have reviewed the triage vital signs and the nursing notes.  Pertinent labs & imaging results that were available during my care of the patient  were reviewed by me and considered in my medical decision making (see chart for details).  Clinical Course as of Nov 21 1755  Thu Nov 20, 2017  1627 Lactic elevated.  Hgb decreased from prior.     [JK]  1647 BUN and CR elevated from 8 days ago   [JK]  1716 BP is improving.   [JK]    Clinical Course User Index [JK] Dorie Rank, MD    Pt presents with hypotension.  Labs are notable for an AKI.  No recent nausea or vomiting.  Pt does have increased fluids losses from his biliary drain. Lactic acid elevated but no definite infection.  Covered for possible infection but doubt sepsis at this time.  Suspect hypotension related to dehydration. Will admit to the hospital for further treatment.  Final Clinical Impressions(s) / ED Diagnoses   Final diagnoses:  AKI (acute kidney injury) ([place])  Dehydration  Hypotension, unspecified hypotension type      Dorie Rank, MD 11/20/17 1759

## 2017-11-20 NOTE — ED Triage Notes (Signed)
Pt brought in from [place], Hypotensive without response form 2 liters of fluid, pt has had 2 sets of blood culture and labs, pt alert and oriented.

## 2017-11-20 NOTE — Progress Notes (Signed)
Pt B/P 86/59 post 2 L IVF and IV cefepime.  Per [name], admit pt to ED for further evaluation.   Pt transported to [facility] in the Emergency Department.  Report given to [employer], Agricultural consultant.

## 2017-11-20 NOTE — Progress Notes (Signed)
[facility]  [phone] [phone]  Clinic Follow up Note   Patient Care Team: Ramachandran, Ajith, MD as PCP - General (Internal Medicine) 11/20/2017  SUMMARY OF ONCOLOGIC HISTORY: Oncology History   Cancer Staging Intrahepatic cholangiocarcinoma (HCC) Staging form: Intrahepatic Bile Duct, AJCC 8th Edition - Clinical stage from 10/29/2017: Stage IV (cT2, cN1, pM1) - Signed by , , MD on 11/06/2017       Malignant neoplasm metastatic to adrenal gland (HCC)    Initial Diagnosis    Malignant neoplasm metastatic to adrenal gland (HCC)    10/23/2017 Imaging    ABD US IMPRESSION: Cirrhotic appearance of the liver.  2.6 cm questionable ill-defined mass versus area of focal parenchymal heterogeneity in segment 6 of the liver. If further imaging evaluation is desired, liver protocol abdominal MRI should be considered.  Splenomegaly.  Apparent thickening of the gallbladder wall, without evidence of cholelithiasis, and with negative sonographic Murphy's sign. The findings are favored to represent gallbladder wall edema secondary to systemic causes such as hyperproteinemia rather than acute cholecystitis. Please correlate clinically.     10/25/2017 Imaging    MRI IMPRESSION: 1. Advanced changes of cirrhosis. Interval development of multifocal enhancing liver lesions, which is concerning for multicentric hepatocellular carcinoma involving both lobes of liver. 2. Lesion within segment 4 B obstructs the right medial hepatic duct and left main duct resulting in moderate to marked biliary ductal dilatation within the left lobe and dome of liver. There may be partial obstruction of the right lateral duct with mild dilatation. 3. Tumor thrombus within the left branch of the portal vein likely secondary to segment 4 B tumor. 4. Bilateral adrenal metastasis and retroperitoneal nodal metastasis. 5. Stigmata of portal venous hypertension including  varices, ascites and splenomegaly. 6. These results will be called to the ordering clinician or representative by the Radiologist Assistant, and communication documented in the PACS or zVision Dashboard.    10/26/2017 Procedure    IMPRESSION: Status post formal PTC and internal/external biliary drainage.    10/27/2017 Imaging    IMPRESSION: Changes of cirrhosis. Multiple hepatic lesions are noted concerning for multifocal hepatoma or metastases. Biliary drain is in place with the tip in the distal common bile duct. There is mild intrahepatic biliary ductal dilatation. Bilateral adrenal nodules and masses, right larger than left compatible with metastases. Retroperitoneal adenopathy. Large stool burden with mild gaseous distention of the colon. Aortic atherosclerosis.     10/29/2017 Pathology Results    Diagnosis Soft Tissue Needle Core Biopsy, right adrenal mass - METASTATIC ADENOCARCINOMA, SEE COMMENT. Microscopic Comment There are malignant glands with central necrosis. Immunohistochemistry is positive for cytokeratin 20 and CDX-2. Cytokeratin 7, TTF-1 and NapsinA are negative. The morphology and immunoprofile favor a colorectal primary over pancreaticobiliary. Dr. Canacci has reviewed the case. Dr.  was notified on 10/31/2017.    11/04/2017 Procedure    Colonoscopy impression - Seton in place found on digital rectal exam. - Congested mucosa in the rectum, in the sigmoid colon and in the descending colon. - A few diminutive polyps in the rectum and in the sigmoid colon. No colon or rectal mass identified. - No specimens collected.     Intrahepatic cholangiocarcinoma (HCC)   10/27/2017 Imaging    10/27/2017 CT AP IMPRESSION: Changes of cirrhosis. Multiple hepatic lesions are noted concerning for multifocal hepatoma or metastases.  Biliary drain is in place with the tip in the distal common bile duct. There is mild intrahepatic biliary ductal dilatation.  Bilateral    adrenal nodules and masses, right larger than left compatible with metastases.  Retroperitoneal adenopathy.  Large stool burden with mild gaseous distention of the colon.  Aortic atherosclerosis.    10/29/2017 Cancer Staging    Staging form: Intrahepatic Bile Duct, AJCC 8th Edition - Clinical stage from 10/29/2017: Stage IV (cT2, cN1, pM1) - Signed by Belem Hintze, MD on 11/06/2017    10/29/2017 Pathology Results    10/29/2017 Surgical Pathology Diagnosis Soft Tissue Needle Core Biopsy, right adrenal mass - METASTATIC ADENOCARCINOMA, SEE COMMENT.    11/06/2017 Initial Diagnosis    Intrahepatic cholangiocarcinoma (HCC)    11/06/2017 Imaging    11/06/2017 CT AP IMPRESSION: 1. Increase in RIGHT pleural effusion. Stable masslike consolidation at the RIGHT lung base representing neoplasm versus pneumonia. 2. Stable metastatic nodule at the RIGHT lung base. 3. Infiltrative mass in the central LEFT hepatic lobe and discrete lesions in the RIGHT hepatic lobe consistent with metastatic disease versus primary hepatic biliary carcinoma. Favor cholangiocarcinoma. 4. Percutaneous drainage catheter extends the porta hepatis. Small trapped pneumothorax along the tract of the catheter at the inferolateral RIGHT lung base. 5. Stable adrenal metastasis. 6. Metastatic adenopathy in the retroperitoneum unchanged. 7. Morphologic changes in liver consistent with  cirrhosis.    11/08/2017 Imaging    11/08/2017 CT AP IMPRESSION: 1. Overall no significant interval change since the CT of 11/06/2017. Stable positioning of the percutaneous biliary drain with similar degree of biliary ductal dilatation. 2. Cirrhosis with multiple hepatic metastatic disease. Infiltrative appearing lesion in the left lobe of the liver appears to extend into the left portal vein and may represent a tumor thrombus, or bland thrombus. A primary biliary neoplasm (cholangiocarcinoma, or metastatic disease are other  considerations. 3. Retroperitoneal, mesenteric, and adrenal metastatic disease. 4. Hepatic colopathy versus less likely segmental colitis of the hepatic flexure. 5. Small right pleural effusion and bilateral lung base pulmonary masses/nodules as seen previously. 6. Tiny pockets of pneumoperitoneum lateral to the liver along the percutaneous drain, decreased since the prior CT. 7. Other findings as above.    11/17/2017 -  Chemotherapy    gemcitabine/cisplatin on days 1 and 8 every 21 days     INTERVAL HISTORY:  Matthew [facility] is here for a follow up after recent hospitalization for bowel duct abscess and sepsis. He was discharged on 11/15/17.  He presents to the clinic today accompanied by his friend. He notes he does not feel well over all. He has low BP and has been taking half tablet of lisinopril and plans to discontinue now. He felt dizzy yesterday when he stood up and fell against the wall. He note makes sure he ambulates with at least a cane. He notes still being on antibiotics from discharge. He has not been eating as much as his baseline. His abdominal pain from abscess is no longer an issue for him. He has a draining tube in place which he empties at least twice a day with at least 200cc a day.  He tried to be active with walking. He notes his wife is mostly working so he takes care of himself mostly.     REVIEW OF SYSTEMS:   Constitutional: Denies fevers, chills or abnormal weight loss (+) lower appetite (+) dizziness  Eyes: Denies blurriness of vision Ears, nose, mouth, throat, and face: Denies mucositis or sore throat Respiratory: Denies cough, dyspnea or wheezes Cardiovascular: Denies palpitation, chest discomfort or lower extremity swelling Gastrointestinal:  Denies nausea, heartburn or change in bowel habits (+) bowel duct   draining tube in place Skin: Denies abnormal skin rashes Lymphatics: Denies new lymphadenopathy or easy bruising Neurological:Denies numbness,  tingling or new weaknesses Behavioral/Psych: Mood is stable, no new changes  All other systems were reviewed with the patient and are negative.  MEDICAL HISTORY:  Past Medical History:  Diagnosis Date  . Abscess of anal and rectal regions    horse shoe abscess  . Alcoholic cirrhosis (HCC)   . Arthritis    "everywhere; especially in my spine"  . Cirrhosis (HCC)    STAGE 1 CIRRHOSIS PATIENT SEES DR NAMDIGAM FOR  . Colon cancer (HCC) 05/2005   TX SURGERY WITH LYMPH NODE REMOVAL  . Cough    LAST FEW WEEKS SAW DR RAMOS LUNGS MILKY SPUTUM OCC  . DM type 2 with diabetic peripheral neuropathy (HCC)    left foot  . Fatigue   . Fistula, anal    multiple  . History of substance abuse (HCC)    last alcohol was 08/2011; last marijuania was 08/2011  . Hypertension   . PTSD (post-traumatic stress disorder)    "severe" HX OF  . Sleep deprivation     SURGICAL HISTORY: Past Surgical History:  Procedure Laterality Date  . ABSCESS DRAINAGE     "probably 15-20 so far at least; rectal"  . ANAL FISTULECTOMY  12/13/1998  . ANTERIOR FUSION CERVICAL SPINE  2010   "triple"; "did 2 surgeries on the same day in 2010 due to complications"  . CORONARY ANGIOPLASTY  2001  . detached muscle  2010   right chest; "after cervical fusion complications"  . ELBOW SURGERY  2007   "cut out part of a muscle"; left  . EXAMINATION UNDER ANESTHESIA  03/22/2005   fistula  . EXAMINATION UNDER ANESTHESIA  06/06/2011   Procedure: EXAM UNDER ANESTHESIA;  Surgeon: Faera Byerly, MD;  Location: [facility] OR;  Service: General;  Laterality: N/A;  . HEMICOLECTOMY  06/06/2005   left  . INCISION AND DRAINAGE PERIRECTAL ABSCESS  03/30/2005  . INCISION AND DRAINAGE PERIRECTAL ABSCESS  12/13/2004  . INCISION AND DRAINAGE PERIRECTAL ABSCESS  07/14/2001  . INCISION AND DRAINAGE PERIRECTAL ABSCESS  08/12/2010   horseshoe abscess; Dr Wilson  . INCISION AND DRAINAGE PERIRECTAL ABSCESS  06/06/2011   Procedure: IRRIGATION AND DEBRIDEMENT  PERIRECTAL ABSCESS;  Surgeon: Faera Byerly, MD;  Location: [facility] OR;  Service: General;  Laterality: N/A;  . INCISION AND DRAINAGE PERIRECTAL ABSCESS N/A 02/24/2017   Procedure: IRRIGATION AND DEBRIDEMENT PERIRECTAL ABSCESS;  Surgeon: Ramirez, Armando, MD;  Location: [facility] OR;  Service: General;  Laterality: N/A;  . IR EXCHANGE BILIARY DRAIN  11/07/2017  . IR INT EXT BILIARY DRAIN WITH CHOLANGIOGRAM  10/26/2017  . IR RADIOLOGIST EVAL & MGMT  11/06/2017  . LUMBAR DISC SURGERY  1987  . PERCUTANEOUS PINNING PHALANX FRACTURE OF HAND  ~ 2008   "plates in 2 places"; right  . PLACEMENT OF SETON N/A 09/05/2017   Procedure: PLACEMENT OF SETON;  Surgeon: Thomas, Alicia, MD;  Location: [facility];  Service: General;  Laterality: N/A;  . RADIOLOGY WITH ANESTHESIA N/A 10/25/2017   Procedure: MRI WITH ANESTHESIA;  Surgeon: Radiologist, Medication, MD;  Location: [facility] OR;  Service: Radiology;  Laterality: N/A;  . THORACIC DISCECTOMY  1990's    I have reviewed the social history and family history with the patient and they are unchanged from previous note.  ALLERGIES:  is allergic to morphine and related and other.  MEDICATIONS:  Current Facility-Administered Medications  Medication Dose Route Frequency Provider   Last Rate Last Dose  . 0.9 %  sodium chloride infusion   Intravenous Continuous [name], MD   Stopped at 11/20/17 1433  . ceFEPIme (MAXIPIME) 2 g in sodium chloride 0.9 % 100 mL IVPB  2 g Intravenous Q12H [name], MD   Stopped at 11/20/17 1055   Current Outpatient Medications  Medication Sig Dispense Refill  . ALPRAZolam (XANAX) 0.5 MG tablet Take 1 tablet 1 hour before PET scan (Patient not taking: Reported on 11/20/2017) 1 tablet 0  . aspirin EC 81 MG tablet Take 81 mg by mouth daily.    Marland Kitchen atorvastatin (LIPITOR) 40 MG tablet Take 1 tablet (40 mg total) by mouth daily at 6 PM. (Patient taking differently: Take 20 mg by mouth daily at 6 PM. ) 30 tablet 0  . calcium carbonate (TUMS -  DOSED IN MG ELEMENTAL CALCIUM) 500 MG chewable tablet Chew 1 tablet by mouth 2 (two) times daily as needed for indigestion or heartburn.    . celecoxib (CELEBREX) 200 MG capsule Take 1 capsule by mouth at bedtime.   5  . empagliflozin (JARDIANCE) 10 MG TABS tablet Take 10 mg by mouth at bedtime.     Marland Kitchen Fexofenadine-Pseudoephedrine (ALLEGRA-D 24 HOUR PO) Take 1 tablet by mouth every evening. WAL/FEX     . gabapentin (NEURONTIN) 400 MG capsule Take 400-800 mg by mouth 2 (two) times daily. 1 capsule am, 2 capsules pm    . glipiZIDE (GLUCOTROL) 5 MG tablet Take 5 mg by mouth 2 (two) times daily before a meal.    . Insulin Detemir (LEVEMIR FLEXPEN) 100 UNIT/ML Pen Inject 10-60 Units into the skin 2 (two) times daily. Take 62 units in the am and Take 10 units in the pm    . lisinopril (PRINIVIL,ZESTRIL) 20 MG tablet Take 10 mg by mouth daily.     . metFORMIN (GLUCOPHAGE) 1000 MG tablet Take 1,000 mg by mouth 2 (two) times daily with a meal.    . Multiple Vitamin (MULTIVITAMIN) tablet Take 1 tablet by mouth daily.    . ondansetron (ZOFRAN) 8 MG tablet Take 1 tablet (8 mg total) by mouth 2 (two) times daily as needed. Start on the third day after chemotherapy. 30 tablet 1  . oxyCODONE 10 MG TABS Take 1 tablet (10 mg total) by mouth every 4 (four) hours as needed for severe pain. 10 tablet 0  . polyvinyl alcohol (LIQUIFILM TEARS) 1.4 % ophthalmic solution Place 1 drop into both eyes daily as needed for dry eyes.     Marland Kitchen prochlorperazine (COMPAZINE) 10 MG tablet Take 1 tablet (10 mg total) by mouth every 6 (six) hours as needed (Nausea or vomiting). 30 tablet 1   Facility-Administered Medications Ordered in Other Visits  Medication Dose Route Frequency Provider Last Rate Last Dose  . metroNIDAZOLE (FLAGYL) IVPB 500 mg  500 mg Intravenous Sharol Harness, MD 100 mL/hr at 11/20/17 1800 500 mg at 11/20/17 1800  . vancomycin (VANCOCIN) IVPB 1000 mg/200 mL premix  1,000 mg Intravenous Once Dorie Rank, MD 200 mL/hr at  11/20/17 1844 1,000 mg at 11/20/17 1844    PHYSICAL EXAMINATION: ECOG PERFORMANCE STATUS: 3 - Symptomatic, >50% confined to bed  Vitals:   11/20/17 0846 11/20/17 0849  BP: (!) 80/53 (!) 81/55  Pulse:  (!) 104  Resp:  16  SpO2:  100%   Filed Weights   11/20/17 0849  Weight: 203 lb 8 oz (92.3 kg)    GENERAL:alert, no distress and comfortable SKIN:  skin color, texture, turgor are normal, no rashes or significant lesions EYES: normal, Conjunctiva are pink and non-injected, sclera clear OROPHARYNX:no exudate, no erythema and lips, buccal mucosa, and tongue normal  NECK: supple, thyroid normal size, non-tender, without nodularity LYMPH:  no palpable lymphadenopathy in the cervical, axillary or inguinal LUNGS: clear to auscultation and percussion with normal breathing effort HEART: regular rate & rhythm and no murmurs and no lower extremity edema ABDOMEN:abdomen soft, non-tender and normal bowel sounds (+) right abdominal draining tube in place Musculoskeletal:no cyanosis of digits and no clubbing  NEURO: alert & oriented x 3 with fluent speech, no focal motor/sensory deficits  LABORATORY DATA:  I have reviewed the data as listed CBC Latest Ref Rng & Units 11/20/2017 11/20/2017 11/10/2017  WBC 4.0 - 10.5 K/uL 10.3 10.1 5.7  Hemoglobin 13.0 - 17.0 g/dL 9.8(L) 11.2(L) 8.6(L)  Hematocrit 39.0 - 52.0 % 31.1(L) 33.7(L) 28.1(L)  Platelets 150 - 400 K/uL 548(H) 584(H) 244     CMP Latest Ref Rng & Units 11/20/2017 11/20/2017 11/10/2017  Glucose 70 - 99 mg/dL 83 182(H) 111(H)  BUN 8 - 23 mg/dL 61(H) 58(H) 11  Creatinine 0.61 - 1.24 mg/dL 1.36(H) 1.71(H) 0.53(L)  Sodium 135 - 145 mmol/L 125(L) 124(L) 131(L)  Potassium 3.5 - 5.1 mmol/L 5.3(H) 5.6(H) 4.1  Chloride 98 - 111 mmol/L 99 95(L) 102  CO2 22 - 32 mmol/L 17(L) 15(L) 20(L)  Calcium 8.9 - 10.3 mg/dL 8.8(L) 10.1 7.9(L)  Total Protein 6.5 - 8.1 g/dL 8.4(H) 9.1(H) 6.6  Total Bilirubin 0.3 - 1.2 mg/dL 1.9(H) 2.3(H) 4.4(H)  Alkaline  Phos 38 - 126 U/L 502(H) 638(H) 327(H)  AST 15 - 41 U/L 40 41 58(H)  ALT 0 - 44 U/L 32 32 45(H)      RADIOGRAPHIC STUDIES: I have personally reviewed the radiological images as listed and agreed with the findings in the report. Dg Chest Port 1 View  Result Date: 11/20/2017 CLINICAL DATA:  62 y/o M; metastatic right adrenal gland cancer. Weakness with onset yesterday and fall. EXAM: PORTABLE CHEST 1 VIEW COMPARISON:  11/08/2017 chest radiograph. FINDINGS: The heart size and mediastinal contours are within normal limits. Both lungs are clear. ACDF hardware partially visualized. No acute osseous abnormality is evident. IMPRESSION: No active disease. Electronically Signed   By: Lance  Furusawa-Stratton M.D.   On: 11/20/2017 16:00     ASSESSMENT & PLAN:  62 y.o. Caucasian male, with past medical history of stage II descending colon cancer in 2007,that post left hemicolectomy, recurrent perianal stool and abscess, was last surgery in September2019,multiple substance abuse including alcohol, liver cirrhosis from alcohol and [place].   1.metastatic adenocarcinoma to right adrenal gland, with multiple liver and lung masses, retroperitoneal adenopathy, likely metastatic cholangiocarcinoma -His biopsy showed adenocarcinoma, favor colorectal primary.  Work up was negative for colorectal tumor.  He did have colon cancer 12 years ago, however it would be very unusual for colon adenocarcinoma to recur in 12 years. His case was discussed in thoracic and GI tumor boards and we are still not certain the primary. From clinic and image standpoint, we think cholangiocarcinoma is probably the primary.  -I previously reviewed that given his metastatic disease, his cancer is not operable and is likely not curable with chemotherapy alone, but is treatable.  -I recommended palliative systemic cisplatin and gemcitabine given on days 1 and 8 every 21 days. The goal is to control his disease, to improve symptoms and  quality of life, and prolong his life. Chemo consent was   given.  -FO is pending, to see if he is a candidate for immunotherapy or other targeted therapy in addition to chemotherapy  -He was hospitalized on 11/08/17 for fever and sepsis from abscess of bowel duct. He was treated with antibiotics and still continues current dose. He has a draining tube of bowel duct which he empties 200cc at least twice a day.  -His performance status and nutrition continues to be very poor. He was found to be hypotensive on his follow-up in my clinic today.  We will hold chemotherapy. -I will give IV fluids and get cultures to rule out infection.  -Pt has been doing poorly since his cancer diagnosis, may not be a candidate for chemo, will contonue to discuss the goal of care with him and his wife   2. Hypotension -He has had intermittent dizziness a few days, had a fall at home yesterday.  Was found to be hypertensive when to the office visit.  Is afebrile, white count is normal. -We will start IV fluids in the infusion room, given his recent biliary tract infection, I will give a dose of cefepime in the infusion room, and obtain blood and biliary fluid culture    3.Obstructive jaundice secondary to #1 -s/p external biliary drain on 10/29/17 per IR Dr. Yamagata; drain is in place in distal common bile duct -transaminases and hyperbilirubinemia are improving as is his clinical juandice.  -Bili 4.5 today, drain output is moderate and stable  -He will f/u with IR    4.  Moderate protein and calorie malnutrition -He presented with 20 lbs weight loss over last few months  -Unclear if today's weight is accurate.  -I placed urgent dietician referral to evaluate him prior to chemo with ongoing f/u  5.Liver cirrhosis secondary to alcohol and NASH  6. Portal vein thrombosis -on aspirin  -has not been on coagulation, due to his initial bloody drainage from biliary drainage tube   7. DM, HTN -BG 130-150's  lately, on empagliflozin, glipizide, insulin detemir, and metformin  -on lisinopril  -His BP has been very low, he has been dizzy lately which has resulted in a fall yesterday. He will stop his half tablet lisinopril.  -I will give IV fluids today. If BP does not improve with IV he may have an infection.   8 H/o substance abuse and addiction  -he attends meetings, has been clean 6 years  -He was prescribed oxycodone at hospital discharge for his abdominal pain, he has a clear source of his pain. He uses oxycodone sparingly.  -I previously referred him to SW for ongoing support   PLAN: -No Chemo today  -IVF NS 500ml/hr for 3-4 hours -blood and biliary drainage fluids cultures  -cefepime 2g iv once  -Pt feels better after IV fluids, but remains to be hypotensive with systolic blood pressure in the 80s.  I recommend transferring him to [facility] emergency room for further evaluation and probable hospital admission. I will f/u    No problem-specific Assessment & Plan notes found for this encounter.   Orders Placed This Encounter  Procedures  . Culture, Blood    Standing Status:   Future    Number of Occurrences:   1    Standing Expiration Date:   11/20/2018  . Body fluid culture    Biliary fluids   All questions were answered. The patient knows to call the clinic with any problems, questions or concerns. No barriers to learning was detected. I spent 35 minutes counseling the   patient face to face. The total time spent in the appointment was 45 minutes and more than 50% was on counseling and review of test results     [name], MD 11/20/2017   I, Joslyn Devon, am acting as scribe for [name], MD.   I have reviewed the above documentation for accuracy and completeness, and I agree with the above.

## 2017-11-20 NOTE — Progress Notes (Signed)
Pharmacy Antibiotic Note  Matthew [facility] is a 62 y.o. male admitted on 11/20/2017 with sepsis.  Pharmacy has been consulted for vanc/cefepime dosing.  Plan: 1) Vancomycin 1g IV x 1 already given at 1844. Give another 1g Vanc x 1 to = 2g for vanc loading dose. Then start 1500mg  IV q24 - goal AUC 400-500 2) Cefepime 2g IV x 1 then 1g IV q8 per current renal function     Temp (24hrs), Avg:97.7 F (36.5 C), Min:97.5 F (36.4 C), Max:98.1 F (36.7 C)  Recent Labs  Lab 11/20/17 0812 11/20/17 1500 11/20/17 1516  WBC 10.1 10.3  --   CREATININE 1.71* 1.36*  --   LATICACIDVEN  --   --  2.00*    Estimated Creatinine Clearance: 60.2 mL/min (A) (by C-G formula based on SCr of 1.36 mg/dL (H)).    Allergies  Allergen Reactions  . Morphine And Related Other (See Comments)    PATIENT IS RECOVERING FROM DRUG ADDICTION PATIENT WANTS TO AVOID ANY NARCOTICS  . Other Other (See Comments)    PATIENT IS RECOVERING FROM DRUG ADDICTION PATIENT WANTS TO AVOID ANY NARCOTICS     Thank you for allowing pharmacy to be a part of this patient's care.  Kara Mead 11/20/2017 7:41 PM

## 2017-11-20 NOTE — H&P (Addendum)
History and Physical    Matthew Lewis LKT:[id number] DOB: 10/29/1955 DOA: 11/20/2017  PCP: [name], MD  Patient coming from: Cancer center  I have personally briefly reviewed patient's old medical records in [place]  Chief Complaint: Generalized weakness  HPI: Matthew Lewis is a 62 y.o. male with medical history significant of Metastatic cancer with involvement of the liver, cirrhosis due to alcohol abuse, alcohol abuse now in remission for that past 6 years.  stage II descending colon cancer in 2007,s/pleft hemicolectomy, DM 2, CAD sp angioplasty in 2000.  Today he was sent to the ED for hypotension.  He had a recent admission for sepsis, segmental colitis, obstructive jaundice.  Patient reportedly was at the cancer center today to receive chemotherapy treatment.  He has been having generalized weakness without any other significant symptoms.  In the cancer center he was noted to have low blood pressure and was sent to the ER for further treatment.  Patient at this time denies having any cough, chest pain, shortness of breath, nausea, vomiting, headache, fevers, chills, abdominal pain, diarrhea or dysuria or any other symptoms other than generalized weakness.  Regarding pertinent Chronic problems: Liver cancer Have not started on chemotherapy yet as he has been sick EGD6/2018 per Dr. Georgette Dover varices, last colonoscopy was 5 years ago.   MRCP in the hospital, which showed multiple liver lesions, bilateral adrenal gland masses and retroperitoneal adenopathy concerning for metastasis.He was seen by GI and IR, and underwent percutaneous biliary drainage tube placementon 10/26/17. He underwent CT biopsy of right adrenal mass on 10/29/17 that confirms metastatic adenocarcinoma with morphology that favors colorectal primary over pancreatobiliary. He was discharged same day.  ED Course: In the ED he was found to have a low blood pressure.  He also had  hyponatremia, elevated BUN/creatinine. He was given IV fluids with which his blood pressure improved.  He was started on empiric IV vancomycin, cefepime, Flagyl.  Review of Systems: As per HPI otherwise 10 point review of systems negative.    Past Medical History:  Diagnosis Date  . Abscess of anal and rectal regions    horse shoe abscess  . Alcoholic cirrhosis ([place])   . Arthritis    "everywhere; especially in my spine"  . Cirrhosis ([place])    STAGE 1 CIRRHOSIS PATIENT SEES DR NAMDIGAM FOR  . Colon cancer ([place]) 05/2005   TX SURGERY WITH LYMPH NODE REMOVAL  . Cough    LAST FEW WEEKS SAW DR RAMOS LUNGS MILKY SPUTUM OCC  . DM type 2 with diabetic peripheral neuropathy (HCC)    left foot  . Fatigue   . Fistula, anal    multiple  . History of substance abuse ([place])    last alcohol was 08/2011; last marijuania was 08/2011  . Hypertension   . PTSD (post-traumatic stress disorder)    "severe" HX OF  . Sleep deprivation     Past Surgical History:  Procedure Laterality Date  . ABSCESS DRAINAGE     "probably 15-20 so far at least; rectal"  . ANAL FISTULECTOMY  12/13/1998  . ANTERIOR FUSION CERVICAL SPINE  2010   "triple"; "did 2 surgeries on the same day in 3428 due to complications"  . CORONARY ANGIOPLASTY  2001  . detached muscle  2010   right chest; "after cervical fusion complications"  . ELBOW SURGERY  2007   "cut out part of a muscle"; left  . EXAMINATION UNDER ANESTHESIA  03/22/2005   fistula  . EXAMINATION  UNDER ANESTHESIA  06/06/2011   Procedure: EXAM UNDER ANESTHESIA;  Surgeon: Stark Klein, MD;  Location: [facility];  Service: General;  Laterality: N/A;  . HEMICOLECTOMY  06/06/2005   left  . INCISION AND DRAINAGE PERIRECTAL ABSCESS  03/30/2005  . INCISION AND DRAINAGE PERIRECTAL ABSCESS  12/13/2004  . INCISION AND DRAINAGE PERIRECTAL ABSCESS  07/14/2001  . INCISION AND DRAINAGE PERIRECTAL ABSCESS  08/12/2010   horseshoe abscess; Dr Redmond Pulling  . INCISION AND DRAINAGE PERIRECTAL ABSCESS   06/06/2011   Procedure: IRRIGATION AND DEBRIDEMENT PERIRECTAL ABSCESS;  Surgeon: Stark Klein, MD;  Location: [facility];  Service: General;  Laterality: N/A;  . INCISION AND DRAINAGE PERIRECTAL ABSCESS N/A 02/24/2017   Procedure: IRRIGATION AND DEBRIDEMENT PERIRECTAL ABSCESS;  Surgeon: Ralene Ok, MD;  Location: [facility];  Service: General;  Laterality: N/A;  . IR EXCHANGE BILIARY DRAIN  11/07/2017  . IR INT EXT BILIARY DRAIN WITH CHOLANGIOGRAM  10/26/2017  . IR RADIOLOGIST EVAL & MGMT  11/06/2017  . [place]  . PERCUTANEOUS PINNING PHALANX FRACTURE OF HAND  ~ 2008   "plates in 2 places"; right  . PLACEMENT OF SETON N/A 09/05/2017   Procedure: PLACEMENT OF SETON;  Surgeon: Leighton Ruff, MD;  Location: [facility];  Service: General;  Laterality: N/A;  . RADIOLOGY WITH ANESTHESIA N/A 10/25/2017   Procedure: MRI WITH ANESTHESIA;  Surgeon: Radiologist, Medication, MD;  Location: [facility];  Service: Radiology;  Laterality: N/A;  . THORACIC DISCECTOMY  1990's     reports that he quit smoking about 26 years ago. His smoking use included cigarettes. He has a 10.00 pack-year smoking history. He has never used smokeless tobacco. He reports that he drinks alcohol. He reports that he has current or past drug history. Drugs: Oxycodone and Marijuana.  Allergies  Allergen Reactions  . Morphine And Related Other (See Comments)    PATIENT IS RECOVERING FROM DRUG ADDICTION PATIENT WANTS TO AVOID ANY NARCOTICS  . Other Other (See Comments)    PATIENT IS RECOVERING FROM DRUG ADDICTION PATIENT WANTS TO AVOID ANY NARCOTICS    Family History  Problem Relation Age of Onset  . Heart disease Father   . Heart disease Mother   . Cancer Mother   . Cancer Sister   . Heart disease Brother     Prior to Admission medications   Medication Sig Start Date End Date Taking? Authorizing Provider  aspirin EC 81 MG tablet Take 81 mg by mouth daily.   Yes [provider]    atorvastatin (LIPITOR) 40 MG tablet Take 1 tablet (40 mg total) by mouth daily at 6 PM. Patient taking differently: Take 20 mg by mouth daily at 6 PM.  12/02/12  Yes Viyuoh, Adeline C, MD  calcium carbonate (TUMS - DOSED IN MG ELEMENTAL CALCIUM) 500 MG chewable tablet Chew 1 tablet by mouth 2 (two) times daily as needed for indigestion or heartburn.   Yes [provider]  celecoxib (CELEBREX) 200 MG capsule Take 1 capsule by mouth at bedtime.  10/10/17  Yes [provider]  empagliflozin (JARDIANCE) 10 MG TABS tablet Take 10 mg by mouth at bedtime.    Yes [provider]  Fexofenadine-Pseudoephedrine (ALLEGRA-D 24 HOUR PO) Take 1 tablet by mouth every evening. WAL/FEX    Yes [provider]  gabapentin (NEURONTIN) 400 MG capsule Take 400-800 mg by mouth 2 (two) times daily. 1 capsule am, 2 capsules pm   Yes [provider]  glipiZIDE (GLUCOTROL) 5  MG tablet Take 5 mg by mouth 2 (two) times daily before a meal.   Yes [provider]  Insulin Detemir (LEVEMIR FLEXPEN) 100 UNIT/ML Pen Inject 10-60 Units into the skin 2 (two) times daily. Take 62 units in the am and Take 10 units in the pm   Yes [provider]  lisinopril (PRINIVIL,ZESTRIL) 20 MG tablet Take 10 mg by mouth daily.    Yes [provider]  metFORMIN (GLUCOPHAGE) 1000 MG tablet Take 1,000 mg by mouth 2 (two) times daily with a meal.   Yes [provider]  Multiple Vitamin (MULTIVITAMIN) tablet Take 1 tablet by mouth daily.   Yes [provider]  oxyCODONE 10 MG TABS Take 1 tablet (10 mg total) by mouth every 4 (four) hours as needed for severe pain. 10/29/17  Yes Nita Sells, MD  ALPRAZolam Duanne Moron) 0.5 MG tablet Take 1 tablet 1 hour before PET scan Patient not taking: Reported on 11/20/2017 11/07/17   Alla Feeling, NP  ondansetron (ZOFRAN) 8 MG tablet Take 1 tablet (8 mg total) by mouth 2 (two) times daily as needed. Start on the third day  after chemotherapy. 11/06/17   [name], MD  polyvinyl alcohol (LIQUIFILM TEARS) 1.4 % ophthalmic solution Place 1 drop into both eyes daily as needed for dry eyes.     [provider]  prochlorperazine (COMPAZINE) 10 MG tablet Take 1 tablet (10 mg total) by mouth every 6 (six) hours as needed (Nausea or vomiting). 11/06/17   [name], MD    Physical Exam: Vitals:   11/20/17 1645 11/20/17 1700 11/20/17 1730 11/20/17 1745  BP: 101/61 99/64 (!) 96/56 (!) 102/58  Pulse: 92 93 92 99  Resp: 18 17 16 16   Temp:      TempSrc:      SpO2: 100% 100% 99% 100%    Constitutional: NAD, calm, comfortable Vitals:   11/20/17 1645 11/20/17 1700 11/20/17 1730 11/20/17 1745  BP: 101/61 99/64 (!) 96/56 (!) 102/58  Pulse: 92 93 92 99  Resp: 18 17 16 16   Temp:      TempSrc:      SpO2: 100% 100% 99% 100%   Eyes: PERRL, lids and conjunctivae normal ENMT: Mucous membranes are moist. Posterior pharynx clear of any exudate or lesions.Normal dentition.  Neck: normal, supple, no masses, no thyromegaly Respiratory: clear to auscultation bilaterally, no wheezing, no crackles. Normal respiratory effort. No accessory muscle use.  Cardiovascular: Regular rate and rhythm, no murmurs / rubs / gallops. No extremity edema. 2+ pedal pulses. No carotid bruits.  Abdomen: no tenderness, no masses palpated. No hepatosplenomegaly. Bowel sounds positive.  Right upper quadrant drain in place. Musculoskeletal: no clubbing / cyanosis. No joint deformity upper and lower extremities. Good ROM, no contractures. Normal muscle tone.  Skin: no rashes, lesions, ulcers. No induration Neurologic: CN 2-12 grossly intact. Sensation intact, DTR normal. Strength 5/5 in all 4.  Psychiatric: Normal judgment and insight. Alert and oriented x 3. Normal mood.   Labs on Admission: I have personally reviewed following labs and imaging studies  CBC: Recent Labs  Lab 11/20/17 0812 11/20/17 1500  WBC 10.1 10.3  NEUTROABS 7.1 7.2    HGB 11.2* 9.8*  HCT 33.7* 31.1*  MCV 88.9 95.7  PLT 584* 614*   Basic Metabolic Panel: Recent Labs  Lab 11/20/17 0812 11/20/17 1500  NA 124* 125*  K 5.6* 5.3*  CL 95* 99  CO2 15* 17*  GLUCOSE 182* 83  BUN 58* 61*  CREATININE 1.71* 1.36*  CALCIUM 10.1 8.8*   GFR: Estimated Creatinine Clearance: 60.2 mL/min (A) (by C-G formula based on SCr of 1.36 mg/dL (H)). Liver Function Tests: Recent Labs  Lab 11/20/17 0812 11/20/17 1500  AST 41 40  ALT 32 32  ALKPHOS 638* 502*  BILITOT 2.3* 1.9*  PROT 9.1* 8.4*  ALBUMIN 2.5* 2.5*   Recent Labs  Lab 11/20/17 1500  LIPASE 69*   Recent Labs  Lab 11/20/17 1637  AMMONIA 20   Coagulation Profile: Recent Labs  Lab 11/20/17 1500  INR 1.22   Cardiac Enzymes: No results for input(s): CKTOTAL, CKMB, CKMBINDEX, TROPONINI in the last 168 hours. BNP (last 3 results) No results for input(s): PROBNP in the last 8760 hours. HbA1C: No results for input(s): HGBA1C in the last 72 hours. CBG: No results for input(s): GLUCAP in the last 168 hours. Lipid Profile: No results for input(s): CHOL, HDL, LDLCALC, TRIG, CHOLHDL, LDLDIRECT in the last 72 hours. Thyroid Function Tests: No results for input(s): TSH, T4TOTAL, FREET4, T3FREE, THYROIDAB in the last 72 hours. Anemia Panel: No results for input(s): VITAMINB12, FOLATE, FERRITIN, TIBC, IRON, RETICCTPCT in the last 72 hours. Urine analysis:    Component Value Date/Time   COLORURINE YELLOW 11/20/2017 1653   APPEARANCEUR HAZY (A) 11/20/2017 1653   LABSPEC 1.011 11/20/2017 1653   PHURINE 5.0 11/20/2017 1653   GLUCOSEU 150 (A) 11/20/2017 1653   HGBUR NEGATIVE 11/20/2017 1653   BILIRUBINUR NEGATIVE 11/20/2017 1653   KETONESUR NEGATIVE 11/20/2017 1653   PROTEINUR NEGATIVE 11/20/2017 1653   UROBILINOGEN 0.2 08/15/2011 1816   NITRITE NEGATIVE 11/20/2017 1653   LEUKOCYTESUR NEGATIVE 11/20/2017 1653    Radiological Exams on Admission: Dg Chest Port 1 View  Result Date:  11/20/2017 CLINICAL DATA:  62 y/o M; metastatic right adrenal gland cancer. Weakness with onset yesterday and fall. EXAM: PORTABLE CHEST 1 VIEW COMPARISON:  11/08/2017 chest radiograph. FINDINGS: The heart size and mediastinal contours are within normal limits. Both lungs are clear. ACDF hardware partially visualized. No acute osseous abnormality is evident. IMPRESSION: No active disease. Electronically Signed   By: Kristine Garbe M.D.   On: 11/20/2017 16:00    Assessment/Plan Active Problems:   Diabetes mellitus ([place])   Hyponatremia   Alcoholic cirrhosis of liver without ascites (HCC)   Malignant neoplasm metastatic to adrenal gland (HCC)   Intrahepatic cholangiocarcinoma (HCC)   Hypotension   Acute renal failure (ARF) (HCC)    Hypotension: Most likely secondary to dehydration.  He is afebrile without any leukocytosis.  His venous lactate was mildly elevated at 2.  Blood pressure responded to IV fluids.  He denies having any major complaints at this time.  Continue IV hydration.  Continue to monitor blood pressure.  Started on empiric IV vancomycin, cefepime, Flagyl.  If he remains afebrile without leukocytosis and the blood pressure is improving we can de-escalate the antibiotics in the next 1 to 2 days.  Hyponatremia: Could be secondary to dehydration as well.  Currently on IV fluids.  Monitor sodium.  He also has alcoholic cirrhosis of the liver without ascites.  He does not appear to be volume overloaded at this time.  Acute renal failure: Patient found to have elevated BUN/and compared to his baseline.  Likely secondary to dehydration.  Will hold lisinopril.  On IV fluids as mentioned above.  Monitor BUN/creatinine closely.  Avoid nephrotoxic medications.  Diabetes mellitus: Blood glucose is on the lower level likely due to worsening renal failure, will hold his medications for  now.  Ordered Accu-Cheks, insulin sliding scale.  Intrahepatic cholangiocarcinoma/metastatic to  adrenal gland: Currently has a right upper quadrant drain in place.  He follows up with outpatient oncology.  Coronary artery disease: Continue home meds.  DVT prophylaxis: Subcutaneous heparin due to high risk of DVT Code Status: Patient previously was DNR but not wanting to be full code. Disposition Plan: Home Consults called: None Admission status: Inpatient   Romin Divita MD [facility] Pager [phone]  If 7PM-7AM, please contact night-coverage [email or web address] Password [email or web address]  11/20/2017, 8:25 PM

## 2017-11-21 ENCOUNTER — Telehealth: Payer: Self-pay

## 2017-11-21 DIAGNOSIS — E46 Unspecified protein-calorie malnutrition: Secondary | ICD-10-CM

## 2017-11-21 DIAGNOSIS — C221 Intrahepatic bile duct carcinoma: Secondary | ICD-10-CM

## 2017-11-21 DIAGNOSIS — Z794 Long term (current) use of insulin: Secondary | ICD-10-CM

## 2017-11-21 DIAGNOSIS — E875 Hyperkalemia: Secondary | ICD-10-CM

## 2017-11-21 DIAGNOSIS — N179 Acute kidney failure, unspecified: Principal | ICD-10-CM

## 2017-11-21 DIAGNOSIS — C779 Secondary and unspecified malignant neoplasm of lymph node, unspecified: Secondary | ICD-10-CM

## 2017-11-21 DIAGNOSIS — I959 Hypotension, unspecified: Secondary | ICD-10-CM

## 2017-11-21 DIAGNOSIS — Z6828 Body mass index (BMI) 28.0-28.9, adult: Secondary | ICD-10-CM

## 2017-11-21 DIAGNOSIS — C7802 Secondary malignant neoplasm of left lung: Secondary | ICD-10-CM

## 2017-11-21 DIAGNOSIS — E861 Hypovolemia: Secondary | ICD-10-CM

## 2017-11-21 DIAGNOSIS — I81 Portal vein thrombosis: Secondary | ICD-10-CM

## 2017-11-21 DIAGNOSIS — I9589 Other hypotension: Secondary | ICD-10-CM

## 2017-11-21 DIAGNOSIS — I10 Essential (primary) hypertension: Secondary | ICD-10-CM

## 2017-11-21 DIAGNOSIS — K7030 Alcoholic cirrhosis of liver without ascites: Secondary | ICD-10-CM

## 2017-11-21 DIAGNOSIS — C7970 Secondary malignant neoplasm of unspecified adrenal gland: Secondary | ICD-10-CM

## 2017-11-21 DIAGNOSIS — D630 Anemia in neoplastic disease: Secondary | ICD-10-CM

## 2017-11-21 DIAGNOSIS — E119 Type 2 diabetes mellitus without complications: Secondary | ICD-10-CM

## 2017-11-21 DIAGNOSIS — C7801 Secondary malignant neoplasm of right lung: Secondary | ICD-10-CM

## 2017-11-21 DIAGNOSIS — E871 Hypo-osmolality and hyponatremia: Secondary | ICD-10-CM

## 2017-11-21 LAB — COMPREHENSIVE METABOLIC PANEL
ALT: 29 U/L (ref 0–44)
AST: 36 U/L (ref 15–41)
Albumin: 2.3 g/dL — ABNORMAL LOW (ref 3.5–5.0)
Alkaline Phosphatase: 428 U/L — ABNORMAL HIGH (ref 38–126)
Anion gap: 6 (ref 5–15)
BUN: 40 mg/dL — ABNORMAL HIGH (ref 8–23)
CALCIUM: 8.8 mg/dL — AB (ref 8.9–10.3)
CO2: 17 mmol/L — AB (ref 22–32)
CREATININE: 0.74 mg/dL (ref 0.61–1.24)
Chloride: 107 mmol/L (ref 98–111)
Glucose, Bld: 98 mg/dL (ref 70–99)
Potassium: 5.3 mmol/L — ABNORMAL HIGH (ref 3.5–5.1)
SODIUM: 130 mmol/L — AB (ref 135–145)
Total Bilirubin: 2.1 mg/dL — ABNORMAL HIGH (ref 0.3–1.2)
Total Protein: 7.6 g/dL (ref 6.5–8.1)

## 2017-11-21 LAB — CBC WITH DIFFERENTIAL/PLATELET
ABS IMMATURE GRANULOCYTES: 0.04 10*3/uL (ref 0.00–0.07)
BASOS PCT: 0 %
Basophils Absolute: 0 10*3/uL (ref 0.0–0.1)
EOS PCT: 6 %
Eosinophils Absolute: 0.4 10*3/uL (ref 0.0–0.5)
HCT: 31.4 % — ABNORMAL LOW (ref 39.0–52.0)
HEMOGLOBIN: 9.7 g/dL — AB (ref 13.0–17.0)
Immature Granulocytes: 1 %
Lymphocytes Relative: 6 %
Lymphs Abs: 0.5 10*3/uL — ABNORMAL LOW (ref 0.7–4.0)
MCH: 29.6 pg (ref 26.0–34.0)
MCHC: 30.9 g/dL (ref 30.0–36.0)
MCV: 95.7 fL (ref 80.0–100.0)
MONO ABS: 0.6 10*3/uL (ref 0.1–1.0)
MONOS PCT: 9 %
NEUTROS ABS: 5.6 10*3/uL (ref 1.7–7.7)
Neutrophils Relative %: 78 %
PLATELETS: 312 10*3/uL (ref 150–400)
RBC: 3.28 MIL/uL — ABNORMAL LOW (ref 4.22–5.81)
RDW: 15.7 % — ABNORMAL HIGH (ref 11.5–15.5)
WBC: 7.1 10*3/uL (ref 4.0–10.5)
nRBC: 0 % (ref 0.0–0.2)

## 2017-11-21 LAB — GLUCOSE, CAPILLARY
GLUCOSE-CAPILLARY: 126 mg/dL — AB (ref 70–99)
Glucose-Capillary: 73 mg/dL (ref 70–99)
Glucose-Capillary: 104 mg/dL — ABNORMAL HIGH (ref 70–99)

## 2017-11-21 LAB — ABO/RH: ABO/RH(D): A POS

## 2017-11-21 LAB — CANCER ANTIGEN 19-9: CAN 19-9: 445 U/mL — AB (ref 0–35)

## 2017-11-21 LAB — LACTIC ACID, PLASMA: Lactic Acid, Venous: 1.5 mmol/L (ref 0.5–1.9)

## 2017-11-21 LAB — PROCALCITONIN: PROCALCITONIN: 0.4 ng/mL

## 2017-11-21 MED ORDER — VANCOMYCIN HCL 10 G IV SOLR
1250.0000 mg | Freq: Two times a day (BID) | INTRAVENOUS | Status: DC
  Administered 2017-11-21 – 2017-11-22 (×3): 1250 mg via INTRAVENOUS
  Filled 2017-11-21 (×3): qty 1250

## 2017-11-21 MED ORDER — ADULT MULTIVITAMIN W/MINERALS CH
1.0000 | ORAL_TABLET | Freq: Every day | ORAL | Status: DC
  Administered 2017-11-21 – 2017-11-24 (×4): 1 via ORAL
  Filled 2017-11-21 (×4): qty 1

## 2017-11-21 MED ORDER — SODIUM POLYSTYRENE SULFONATE 15 GM/60ML PO SUSP
30.0000 g | Freq: Once | ORAL | Status: AC
  Administered 2017-11-21: 30 g via ORAL
  Filled 2017-11-21: qty 120

## 2017-11-21 NOTE — Telephone Encounter (Signed)
Spoke to [employer] cancelled PET scan per [name].

## 2017-11-21 NOTE — Progress Notes (Signed)
Initial Nutrition Assessment  DOCUMENTATION CODES:   (Will assess for malnutrition at follow-up)  INTERVENTION:  - Continue Ensure Enlive BID, each supplement provides 350 kcal and 20 grams of protein. - Will order daily multivitamin with minerals.  - Continue to encourage PO intakes. - Will perform NFPE at follow-up.    NUTRITION DIAGNOSIS:   Increased nutrient needs related to chronic illness, catabolic illness, cancer and cancer related treatments as evidenced by estimated needs.  GOAL:   Patient will meet greater than or equal to 90% of their needs  MONITOR:   PO intake, Supplement acceptance, Weight trends, Labs  REASON FOR ASSESSMENT:   Malnutrition Screening Tool  ASSESSMENT:   62 y.o. male with medical history significant of metastatic cancer with involvement of the liver, cirrhosis due to alcohol abuse, alcohol abuse now in remission for that past 6 years,   BMI indicates overweight status. No intakes documented since admission. Patient was in the bathroom at the time of first attempted visit. Talked with RN ~1 hour later and she reported that patient was trying to get some rest. She states that she had been with patient nearly all shift providing care and that patient did have breakfast but she is unsure how much he ate. RN reports that patient did order lunch and wanted to rest until it arrived. She reports offering him an Ensure a short time before discussion with RD but that patient was too tired/worn out and declined at that time.   Will attempt to see patient another day. Patient was seen by this RD on 11/4. At that time, patient met criteria for moderate malnutrition in the context of chronic illness (cancer); suspect that patient continues to meet criteria for malnutrition.   Current weight is 208 lb and, per chart review, patient weighed 233 lb on 8/30. This indicates 25 lb weight loss (10.7% body weight) in the past 2.5 months; this is significant for time  frame.    Medications reviewed; sliding scale Novolog, 30 g Kayexalate x1 dose this AM. Labs reviewed; Na: 130 mmol/L, K: 5.3 mmol/L, BUN: 40 mg/dL, Alk Phos elevated.  IVF; NS @ 100 mL/hr.      NUTRITION - FOCUSED PHYSICAL EXAM:  Will attempt at follow-up.  Diet Order:   Diet Order            Diet heart healthy/carb modified Room service appropriate? Yes; Fluid consistency: Thin  Diet effective now              EDUCATION NEEDS:   Not appropriate for education at this time  Skin:  Skin Assessment: Reviewed RN Assessment  Last BM:  11/14  Height:   Ht Readings from Last 1 Encounters:  11/20/17 6' (1.829 m)    Weight:   Wt Readings from Last 1 Encounters:  11/20/17 94.3 kg    Ideal Body Weight:  80.91 kg  BMI:  Body mass index is 28.2 kg/m.  Estimated Nutritional Needs:   Kcal:  2360-2550 kcal  Protein:  120-130 grams  Fluid:  >/= 2 L/day     Jarome Matin, MS, RD, LDN, [facility] Inpatient Clinical Dietitian Pager # [phone] After hours/weekend pager # [phone]

## 2017-11-21 NOTE — Progress Notes (Signed)
Matthew Lewis   DOB:1955/05/02   [id number]   KDX#:[id number]  Oncology follow-up note  Subjective: Patient is well-known to me, was recently diagnosed with metastatic cholangiocarcinoma.  I saw him in my office yesterday after hospital discharge.  He was found to be hypotensive in my office, did not respond to IV fluids, and was sent to [facility] emergency room and was subsequently admitted.  He overall feels about the same, denies any chest discomfort, he had a episode of diarrhea after Kayexalate, and feel more fatigued afterwards.  His blood pressure has normalized today.   Objective:  Vitals:   11/21/17 0419 11/21/17 1407  BP: (!) 93/55 108/65  Pulse: 96 99  Resp: 18 16  Temp:  98.4 F (36.9 C)  SpO2: 98% 100%    Body mass index is 28.2 kg/m.  Intake/Output Summary (Last 24 hours) at 11/21/2017 1516 Last data filed at 11/21/2017 1408 Gross per 24 hour  Intake 6091.97 ml  Output 3350 ml  Net 2741.97 ml     Sclerae unicteric  Oropharynx clear  No peripheral adenopathy  Lungs clear -- no rales or rhonchi  Heart regular rate and rhythm  Abdomen soft, (+) percutaneous biliary drain tube on right side  MSK no focal spinal tenderness, no peripheral edema  Neuro nonfocal    CBG (last 3)  Recent Labs    11/20/17 2130 11/21/17 0757 11/21/17 1123  GLUCAP 94 73 104*     Labs:  Lab Results  Component Value Date   WBC 7.1 11/21/2017   HGB 9.7 (L) 11/21/2017   HCT 31.4 (L) 11/21/2017   MCV 95.7 11/21/2017   PLT 312 11/21/2017   NEUTROABS 5.6 11/21/2017   CMP Latest Ref Rng & Units 11/21/2017 11/20/2017 11/20/2017  Glucose 70 - 99 mg/dL 98 - 83  BUN 8 - 23 mg/dL 40(H) - 61(H)  Creatinine 0.61 - 1.24 mg/dL 0.74 0.98 1.36(H)  Sodium 135 - 145 mmol/L 130(L) - 125(L)  Potassium 3.5 - 5.1 mmol/L 5.3(H) - 5.3(H)  Chloride 98 - 111 mmol/L 107 - 99  CO2 22 - 32 mmol/L 17(L) - 17(L)  Calcium 8.9 - 10.3 mg/dL 8.8(L) - 8.8(L)  Total Protein 6.5 - 8.1 g/dL 7.6 - 8.4(H)   Total Bilirubin 0.3 - 1.2 mg/dL 2.1(H) - 1.9(H)  Alkaline Phos 38 - 126 U/L 428(H) - 502(H)  AST 15 - 41 U/L 36 - 40  ALT 0 - 44 U/L 29 - 32     Urine Studies No results for input(s): UHGB, CRYS in the last 72 hours.  Invalid input(s): UACOL, UAPR, USPG, UPH, UTP, UGL, UKET, UBIL, UNIT, UROB, [place], UEPI, UWBC, Duwayne Heck [place], [place]  Basic Metabolic Panel: Recent Labs  Lab 11/20/17 9767 11/20/17 1500 11/20/17 2043 11/21/17 0545  NA 124* 125*  --  130*  K 5.6* 5.3*  --  5.3*  CL 95* 99  --  107  CO2 15* 17*  --  17*  GLUCOSE 182* 83  --  98  BUN 58* 61*  --  40*  CREATININE 1.71* 1.36* 0.98 0.74  CALCIUM 10.1 8.8*  --  8.8*   GFR Estimated Creatinine Clearance: 111.2 mL/min (by C-G formula based on SCr of 0.74 mg/dL). Liver Function Tests: Recent Labs  Lab 11/20/17 0812 11/20/17 1500 11/21/17 0545  AST 41 40 36  ALT 32 32 29  ALKPHOS 638* 502* 428*  BILITOT 2.3* 1.9* 2.1*  PROT 9.1* 8.4* 7.6  ALBUMIN 2.5* 2.5* 2.3*  Recent Labs  Lab 11/20/17 1500  LIPASE 69*   Recent Labs  Lab 11/20/17 1637  AMMONIA 20   Coagulation profile Recent Labs  Lab 11/20/17 1500  INR 1.22    CBC: Recent Labs  Lab 11/20/17 0812 11/20/17 1500 11/20/17 2043 11/21/17 0545  WBC 10.1 10.3 8.8 7.1  NEUTROABS 7.1 7.2  --  5.6  HGB 11.2* 9.8* 10.4* 9.7*  HCT 33.7* 31.1* 33.8* 31.4*  MCV 88.9 95.7 97.1 95.7  PLT 584* 548* 336 312   Cardiac Enzymes: No results for input(s): CKTOTAL, CKMB, CKMBINDEX, TROPONINI in the last 168 hours. BNP: Invalid input(s): POCBNP CBG: Recent Labs  Lab 11/20/17 2130 11/21/17 0757 11/21/17 1123  GLUCAP 94 73 104*   D-Dimer No results for input(s): DDIMER in the last 72 hours. Hgb A1c No results for input(s): HGBA1C in the last 72 hours. Lipid Profile No results for input(s): CHOL, HDL, LDLCALC, TRIG, CHOLHDL, LDLDIRECT in the last 72 hours. Thyroid function studies No results for input(s): TSH, T4TOTAL, T3FREE, THYROIDAB  in the last 72 hours.  Invalid input(s): FREET3 Anemia work up No results for input(s): VITAMINB12, FOLATE, FERRITIN, TIBC, IRON, RETICCTPCT in the last 72 hours. Microbiology Recent Results (from the past 240 hour(s))  Body fluid culture     Status: None (Preliminary result)   Collection Time: 11/20/17  9:07 AM  Result Value Ref Range Status   Specimen Description   Final    WOUND Performed at [facility], [address] [place], [place] [address]    Special Requests   Final    NONE Performed at [facility], [place] [address] [place], [place] [address]    Gram Stain NO WBC SEEN RARE GRAM NEGATIVE RODS   Final   Culture   Final    ABUNDANT GRAM NEGATIVE RODS IDENTIFICATION AND SUSCEPTIBILITIES TO FOLLOW Performed at [facility], [place] [address], [place], [place] [address]    Report Status PENDING  Incomplete  Culture, Blood     Status: None (Preliminary result)   Collection Time: 11/20/17 10:04 AM  Result Value Ref Range Status   Specimen Description   Final    BLOOD LEFT HAND Performed at [facility], [place] [address] [place], [place] [address]    Special Requests   Final    NONE Performed at [facility], [place] [address] [place], [place] [address]    Culture   Final    NO GROWTH < 24 HOURS Performed at [place] [address], [place], [place] [address]    Report Status PENDING  Incomplete  Culture, Blood     Status: None (Preliminary result)   Collection Time: 11/20/17 10:17 AM  Result Value Ref Range Status   Specimen Description   Final    BLOOD RIGHT HAND Performed at [facility], [place] [address], [place], [place] [address]    Special Requests   Final    NONE Performed at [facility], [place] [address], [place], [place] [address]    Culture   Final    NO GROWTH < 24 HOURS Performed at [place] [address], [place], [place] [address]    Report Status PENDING  Incomplete      Studies:  Dg Chest Port 1 View  Result Date: 11/20/2017 CLINICAL DATA:  62 y/o M; metastatic right adrenal gland cancer. Weakness with  onset yesterday and fall. EXAM: PORTABLE CHEST 1 VIEW COMPARISON:  11/08/2017 chest radiograph. FINDINGS: The heart size and mediastinal contours are within normal limits. Both lungs are clear. ACDF hardware partially visualized. No acute osseous abnormality is evident. IMPRESSION: No active disease. Electronically Signed   By: Kristine Garbe M.D.   On: 11/20/2017 16:00    Assessment: 62 y.o. male with past medical history of liver cirrhosis, recently diagnosed cholangio-carcinoma with obstructive jaundice  1.  Hypotension, probably secondary to dehydration and BP meds, rule out sepsis  2.  Newly diagnosed metastatic choriocarcinoma to lungs, adrenal glands and nodes  3.  Obstructive jaundice, status post percutaneous biliary drainage 4. Liver cirrhosis secondary to alcohol and [place] 5.  Portal vein thrombosis 6. DM 7.HTN 8.  Malnutrition and deconditioning 9. Anemia in malignant disease  10. Hyperkalemia   Plan:  -His blood pressure has stabilized after IV fluids, cultures are pending, negative so far. -Agree with primary team, if culture negative by tomorrow, okay to DC antibiotics -Unfortunately patient has been admitted multiple times since his cancer diagnosis, his performance status remained to be very poor, I am not sure if he is a candidate for chemotherapy. -I discussed the overall very poor prognosis with patient. We discussed palliative care alone, vs trying chemo (maybe single agent) if he recovers. He voiced good understanding, he seems to be realistic, and OK with  Palliative care alone if he is not able to take chemo.  I spoke with his wife on the phone also, she wants to wait and see how he recovers. I also encouraged him to talk  to their son and a daughter about his illness.  -I will arrange outpatient follow-up next week, continue to discuss goals of care. -Patient is agreeable with DNR  Matthew [name], MD 11/21/2017  3:16 PM

## 2017-11-21 NOTE — Progress Notes (Signed)
Pharmacy Antibiotic Note  Matthew Lewis is a 62 y.o. male with metastatic adenocarcinoma to right adrenal gland, with multiple liver and lung masses, retroperitoneal adenopathy, likely metastatic cholangiocarcinoma who was recently hospitalized for sepsis/segmenttal colitis/obstructive jaundice, presented to the ED from [facility] on 11/20/2017 for workup for hypotension.  He was started on broad abx with vancomycin, cefepime and flagyl for suspected sepsis.  - 11/14 CXR: no active dz  Today, 11/21/2017: - afeb, wbc wnl - scr down 0.74 (crcl~100)   Plan: - continue cefepime 1gm IV q8h - adjust vancomycin dose to 1250 mg IV q12h for est AUC 457 - flagyl 500 mg IV q24h per MD - f/u renal function and cultures  _____________________________________  Height: 6' (182.9 cm) Weight: 207 lb 14.4 oz (94.3 kg) IBW/kg (Calculated) : 77.6  Temp (24hrs), Avg:97.8 F (36.6 C), Min:97.5 F (36.4 C), Max:98 F (36.7 C)  Recent Labs  Lab 11/20/17 0812 11/20/17 1500 11/20/17 1516 11/20/17 2043 11/21/17 0545  WBC 10.1 10.3  --  8.8 7.1  CREATININE 1.71* 1.36*  --  0.98 0.74  LATICACIDVEN  --   --  2.00*  --   --     Estimated Creatinine Clearance: 111.2 mL/min (by C-G formula based on SCr of 0.74 mg/dL).    Allergies  Allergen Reactions  . Morphine And Related Other (See Comments)    PATIENT IS RECOVERING FROM DRUG ADDICTION PATIENT WANTS TO AVOID ANY NARCOTICS  . Other Other (See Comments)    PATIENT IS RECOVERING FROM DRUG ADDICTION PATIENT WANTS TO AVOID ANY NARCOTICS   Antimicrobials this admission: 11/14 vanc >> 11/14 cefepime >> 11/14 Flagyl >>   Microbiology results: 11/14 BCx x2: 11/14 wound fluid:  11/14 bile:   Thank you for allowing pharmacy to be a part of this patient's care.  Lynelle Doctor 11/21/2017 12:19 PM

## 2017-11-21 NOTE — Progress Notes (Signed)
PROGRESS NOTE  Matthew Lewis QMG:[id number] DOB: 02-02-1955 DOA: 11/20/2017 PCP: [name], MD  HPI/Recap of past 24 hours: HPI from [name] is a 62 y.o. male with medical history significant for metastatic cancer with involvement of the liver, adrenal gland, cirrhosisdue to alcohol abuse, alcohol abuse now in remission for the past 6 years, stage II descending colon cancer in 2007,s/pleft hemicolectomy, DM 2, CAD sp angioplasty in 2000 presents to the ED from our cancer clinic (for his chemotherapy) due to hypotension. Pt  had a recent admission for sepsis, segmental colitis, obstructive jaundice.  Patient has been having generalized weakness without any other significant symptoms. Of note, pt was seen by GI and IR, and underwent percutaneous biliary drainage tube placementon 10/26/17. He underwent CT biopsy of right adrenal mass on 10/29/17 that confirms metastatic adenocarcinoma with morphology that favors colorectal primary over pancreatobiliary. In the ED he was found to be hypotensive, hyponatremia, and elevated BUN/creatinine. He was given IV fluids with which his blood pressure improved.  He was started on empiric IV vancomycin, cefepime, Flagyl. Pt admitted for further management.   Today, pt reported feeling ok, denies any new symptoms. No abdominal pain, fever/chills, diarrhea, cough, chest pain, SOB. Wife at bedside.  Assessment/Plan: Active Problems:   Hyperlipemia   Diabetes mellitus ([place])   CAD (coronary artery disease)- PCI 2001- no evaluation since   Hyponatremia   Alcoholic cirrhosis of liver without ascites (HCC)   Malignant neoplasm metastatic to adrenal gland (HCC)   Intrahepatic cholangiocarcinoma (HCC)   Hypotension   Acute renal failure (ARF) (HCC)  Hypotension likely 2/2 dehydration Vs ?sepsis On presentation, afebrile, no leukocytosis, LA 2 Currently afebrile, with no leukocytosis BP improving, but still soft LA 2,  will trend Procalcitonin pending UA negative, asymptomatic BC x2 pending Chest x-ray unremarkable Continue empiric IV vancomycin, cefepime, Flagyl, if culture comes back negative, plan to de-escalate/discontinue antibiotics Monitor closely  Hyponatremia Improving status post IV fluids Daily BMP  Hyperkalemia Potassium 5.3 Status post Kayexalate Daily BMP  AKI Resolved status post IV fluid  Anemia of chronic disease due to malignancy Around baseline Daily CBC  Diabetes mellitus type 2 Last A1c 4.9 on 11/09/2017 SSI, Accu-Cheks Hold home regimen  Obstructive jaundice status post percutaneous biliary drainage/liver cirrhosis secondary to alcohol/NASH Chronically elevated liver enzymes Follows with GI  Metastatic adenocarcinoma to right adrenal gland, with multiple liver and lung masses, retroperitoneal adenopathy, likely metastatic cholangiocarcinoma Follows with Dr.Feng, on board Dr Burr Medico discussed CODE STATUS with patient, agreed to be DNR    Code Status: DNR  Family Communication: Wife at bedside  Disposition Plan: Once work-up complete   Consultants:  Oncology  Procedures:  None  Antimicrobials:  Cefepime  Vancomycin  Flagyl  DVT prophylaxis: Heparin   Objective: Vitals:   11/20/17 2115 11/20/17 2121 11/21/17 0419 11/21/17 1407  BP:  109/67 (!) 93/55 108/65  Pulse:  (!) 101 96 99  Resp:  18 18 16   Temp:  98 F (36.7 C)  98.4 F (36.9 C)  TempSrc:  Oral  Axillary  SpO2:  100% 98% 100%  Weight: 94.3 kg     Height: 6' (1.829 m)       Intake/Output Summary (Last 24 hours) at 11/21/2017 1531 Last data filed at 11/21/2017 1408 Gross per 24 hour  Intake 6091.97 ml  Output 3352 ml  Net 2739.97 ml   Filed Weights   11/20/17 2115  Weight: 94.3 kg    Exam:  General: NAD  Cardiovascular: S1, S2 present  Respiratory: CTA B  Abdomen: Soft, nontender, nondistended, bowel sounds present, percutaneous biliary drain tube on the right  side  Musculoskeletal: No bilateral pedal edema  Skin: Normal  Psychiatry: Normal mood   Data Reviewed: CBC: Recent Labs  Lab 11/20/17 0812 11/20/17 1500 11/20/17 2043 11/21/17 0545  WBC 10.1 10.3 8.8 7.1  NEUTROABS 7.1 7.2  --  5.6  HGB 11.2* 9.8* 10.4* 9.7*  HCT 33.7* 31.1* 33.8* 31.4*  MCV 88.9 95.7 97.1 95.7  PLT 584* 548* 336 099   Basic Metabolic Panel: Recent Labs  Lab 11/20/17 0812 11/20/17 1500 11/20/17 2043 11/21/17 0545  NA 124* 125*  --  130*  K 5.6* 5.3*  --  5.3*  CL 95* 99  --  107  CO2 15* 17*  --  17*  GLUCOSE 182* 83  --  98  BUN 58* 61*  --  40*  CREATININE 1.71* 1.36* 0.98 0.74  CALCIUM 10.1 8.8*  --  8.8*   GFR: Estimated Creatinine Clearance: 111.2 mL/min (by C-G formula based on SCr of 0.74 mg/dL). Liver Function Tests: Recent Labs  Lab 11/20/17 0812 11/20/17 1500 11/21/17 0545  AST 41 40 36  ALT 32 32 29  ALKPHOS 638* 502* 428*  BILITOT 2.3* 1.9* 2.1*  PROT 9.1* 8.4* 7.6  ALBUMIN 2.5* 2.5* 2.3*   Recent Labs  Lab 11/20/17 1500  LIPASE 69*   Recent Labs  Lab 11/20/17 1637  AMMONIA 20   Coagulation Profile: Recent Labs  Lab 11/20/17 1500  INR 1.22   Cardiac Enzymes: No results for input(s): CKTOTAL, CKMB, CKMBINDEX, TROPONINI in the last 168 hours. BNP (last 3 results) No results for input(s): PROBNP in the last 8760 hours. HbA1C: No results for input(s): HGBA1C in the last 72 hours. CBG: Recent Labs  Lab 11/20/17 2130 11/21/17 0757 11/21/17 1123  GLUCAP 94 73 104*   Lipid Profile: No results for input(s): CHOL, HDL, LDLCALC, TRIG, CHOLHDL, LDLDIRECT in the last 72 hours. Thyroid Function Tests: No results for input(s): TSH, T4TOTAL, FREET4, T3FREE, THYROIDAB in the last 72 hours. Anemia Panel: No results for input(s): VITAMINB12, FOLATE, FERRITIN, TIBC, IRON, RETICCTPCT in the last 72 hours. Urine analysis:    Component Value Date/Time   COLORURINE YELLOW 11/20/2017 1653   APPEARANCEUR HAZY (A)  11/20/2017 1653   LABSPEC 1.011 11/20/2017 1653   PHURINE 5.0 11/20/2017 1653   GLUCOSEU 150 (A) 11/20/2017 1653   HGBUR NEGATIVE 11/20/2017 1653   BILIRUBINUR NEGATIVE 11/20/2017 1653   KETONESUR NEGATIVE 11/20/2017 1653   PROTEINUR NEGATIVE 11/20/2017 1653   UROBILINOGEN 0.2 08/15/2011 1816   NITRITE NEGATIVE 11/20/2017 1653   LEUKOCYTESUR NEGATIVE 11/20/2017 1653   Sepsis Labs: @LABRCNTIP (procalcitonin:4,lacticidven:4)  ) Recent Results (from the past 240 hour(s))  Body fluid culture     Status: None (Preliminary result)   Collection Time: 11/20/17  9:07 AM  Result Value Ref Range Status   Specimen Description   Final    WOUND Performed at [facility], [place] [address], [place], [place] [address]    Special Requests   Final    NONE Performed at [facility], [place] [address], [place], [place] [address]    Gram Stain NO WBC SEEN RARE GRAM NEGATIVE RODS   Final   Culture   Final    ABUNDANT GRAM NEGATIVE RODS IDENTIFICATION AND SUSCEPTIBILITIES TO FOLLOW Performed at [facility], [place] [address], [place], [place]  [address]    Report Status PENDING  Incomplete  Culture, Blood     Status: None (Preliminary result)   Collection Time: 11/20/17 10:04 AM  Result Value Ref Range Status   Specimen Description   Final    BLOOD LEFT HAND Performed at [facility], [place] [address], [place], [place] [address]    Special Requests   Final    NONE Performed at [facility], [place] [address], [place], [place] [address]    Culture   Final    NO GROWTH < 24 HOURS Performed at [place] [address], [place], [place] [address]    Report Status PENDING  Incomplete  Culture, Blood     Status: None (Preliminary result)   Collection Time: 11/20/17 10:17 AM  Result Value Ref Range Status   Specimen Description   Final    BLOOD RIGHT HAND Performed at [facility], [place] [address], [place], [place] [address]    Special Requests   Final    NONE Performed at [facility], [place] [address], [place], [place] [address]    Culture   Final    NO GROWTH < 24 HOURS Performed at [place] [address], [place],  [address]    Report Status PENDING  Incomplete      Studies: Dg Chest Port 1 View  Result Date: 11/20/2017 CLINICAL DATA:  62 y/o M; metastatic right adrenal gland cancer. Weakness with onset yesterday and fall. EXAM: PORTABLE CHEST 1 VIEW COMPARISON:  11/08/2017 chest radiograph. FINDINGS: The heart size and mediastinal contours are within normal limits. Both lungs are clear. ACDF hardware partially visualized. No acute osseous abnormality is evident. IMPRESSION: No active disease. Electronically Signed   By: Kristine Garbe M.D.   On: 11/20/2017 16:00    Scheduled Meds: . aspirin EC  81 mg Oral Daily  . atorvastatin  20 mg Oral q1800  . celecoxib  200 mg Oral QHS  . feeding supplement (ENSURE ENLIVE)  237 mL Oral BID BM  . gabapentin  800 mg Oral QHS  . heparin  5,000 Units Subcutaneous Q12H  . insulin aspart  0-9 Units Subcutaneous TID WC  . multivitamin with minerals  1 tablet Oral Daily    Continuous Infusions: . sodium chloride 100 mL/hr at 11/21/17 1039  . ceFEPime (MAXIPIME) IV 1 g (11/21/17 1041)  . metronidazole 500 mg (11/21/17 1120)  . vancomycin 1,250 mg (11/21/17 1331)     LOS: 1 day     Alma Friendly, MD [facility]   If 7PM-7AM, please contact night-coverage [email or web address] 11/21/2017, 3:31 PM

## 2017-11-22 DIAGNOSIS — E785 Hyperlipidemia, unspecified: Secondary | ICD-10-CM

## 2017-11-22 LAB — BASIC METABOLIC PANEL
Anion gap: 5 (ref 5–15)
BUN: 18 mg/dL (ref 8–23)
CALCIUM: 8.6 mg/dL — AB (ref 8.9–10.3)
CO2: 22 mmol/L (ref 22–32)
Chloride: 104 mmol/L (ref 98–111)
Creatinine, Ser: 0.64 mg/dL (ref 0.61–1.24)
GFR calc Af Amer: >60 mL/min (ref >60)
GLUCOSE: 98 mg/dL (ref 70–99)
Potassium: 5 mmol/L (ref 3.5–5.1)
Sodium: 131 mmol/L — ABNORMAL LOW (ref 135–145)

## 2017-11-22 LAB — CBC WITH DIFFERENTIAL/PLATELET
Abs Immature Granulocytes: 0.02 10*3/uL (ref 0.00–0.07)
BASOS PCT: 1 %
Basophils Absolute: 0 10*3/uL (ref 0.0–0.1)
EOS ABS: 0.4 10*3/uL (ref 0.0–0.5)
Eosinophils Relative: 8 %
HCT: 30 % — ABNORMAL LOW (ref 39.0–52.0)
Hemoglobin: 9.3 g/dL — ABNORMAL LOW (ref 13.0–17.0)
IMMATURE GRANULOCYTES: 0 %
Lymphocytes Relative: 16 %
Lymphs Abs: 0.8 10*3/uL (ref 0.7–4.0)
MCH: 29.4 pg (ref 26.0–34.0)
MCHC: 31 g/dL (ref 30.0–36.0)
MCV: 94.9 fL (ref 80.0–100.0)
MONOS PCT: 11 %
Monocytes Absolute: 0.6 10*3/uL (ref 0.1–1.0)
NEUTROS PCT: 64 %
NRBC: 0 % (ref 0.0–0.2)
Neutro Abs: 3.2 10*3/uL (ref 1.7–7.7)
PLATELETS: 293 10*3/uL (ref 150–400)
RBC: 3.16 MIL/uL — ABNORMAL LOW (ref 4.22–5.81)
RDW: 15.6 % — AB (ref 11.5–15.5)
WBC: 5 10*3/uL (ref 4.0–10.5)

## 2017-11-22 LAB — PROCALCITONIN: PROCALCITONIN: 0.27 ng/mL

## 2017-11-22 LAB — GLUCOSE, CAPILLARY
GLUCOSE-CAPILLARY: 141 mg/dL — AB (ref 70–99)
Glucose-Capillary: 82 mg/dL (ref 70–99)
Glucose-Capillary: 121 mg/dL — ABNORMAL HIGH (ref 70–99)

## 2017-11-22 MED ORDER — GABAPENTIN 400 MG PO CAPS
400.0000 mg | ORAL_CAPSULE | Freq: Every morning | ORAL | Status: DC
  Administered 2017-11-22 – 2017-11-24 (×3): 400 mg via ORAL
  Filled 2017-11-22 (×3): qty 1

## 2017-11-22 MED ORDER — ENOXAPARIN SODIUM 40 MG/0.4ML ~~LOC~~ SOLN
40.0000 mg | SUBCUTANEOUS | Status: DC
  Filled 2017-11-22: qty 0.4

## 2017-11-22 MED ORDER — SODIUM CHLORIDE 0.9% FLUSH
10.0000 mL | Freq: Two times a day (BID) | INTRAVENOUS | Status: DC
  Administered 2017-11-22 – 2017-11-24 (×4): 10 mL

## 2017-11-22 NOTE — Evaluation (Signed)
Physical Therapy One Time Evaluation Patient Details Name: Matthew Lewis MRN: [id number] DOB: February 02, 1955 Today's Date: 11/22/2017   History of Present Illness  62 y.o. male with medical history significant for metastatic cancer with involvement of the liver, adrenal gland, cirrhosis due to alcohol abuse, alcohol abuse now in remission for the past 6 years, stage II descending colon cancer in 2007, s/p left hemicolectomy, DM 2, CAD sp angioplasty in 2000 presents to the ED from our cancer clinic (for his chemotherapy) due to hypotension  Clinical Impression  Patient evaluated by Physical Therapy with no further acute PT needs identified. All education has been completed and the patient has no further questions.  See below for any follow-up Physical Therapy or equipment needs. PT is signing off. Thank you for this referral.     Follow Up Recommendations No PT follow up    Equipment Recommendations  None recommended by PT    Recommendations for Other Services       Precautions / Restrictions Precautions Precaution Comments: drain on right with leg bag      Mobility  Bed Mobility Overal bed mobility: Independent                Transfers Overall transfer level: Independent                  Ambulation/Gait Ambulation/Gait assistance: Supervision Gait Distance (Feet): 800 Feet Assistive device: Rolling walker (2 wheeled) Gait Pattern/deviations: WFL(Within Functional Limits)     General Gait Details: no LOB   Stairs            Wheelchair Mobility    Modified Rankin (Stroke Patients Only)       Balance Overall balance assessment: No apparent balance deficits (not formally assessed)(denies falls)                                           Pertinent Vitals/Pain Pain Assessment: No/denies pain Pain Intervention(s): Premedicated before session    Home Living Family/patient expects to be discharged to:: Private residence Living  Arrangements: Spouse/significant other Available Help at Discharge: Family Type of Home: House Home Access: Level entry     Home Layout: Able to live on main level with bedroom/bathroom Home Equipment: Environmental consultant - 2 wheels      Prior Function Level of Independence: Independent with assistive device(s)         Comments: has been using RW     Hand Dominance        Extremity/Trunk Assessment        Lower Extremity Assessment Lower Extremity Assessment: Overall WFL for tasks assessed RLE Sensation: history of peripheral neuropathy LLE Sensation: history of peripheral neuropathy    Cervical / Trunk Assessment Cervical / Trunk Assessment: Normal  Communication   Communication: No difficulties  Cognition Arousal/Alertness: Awake/alert Behavior During Therapy: WFL for tasks assessed/performed Overall Cognitive Status: Within Functional Limits for tasks assessed                                        General Comments      Exercises     Assessment/Plan    PT Assessment Patent does not need any further PT services  PT Problem List         PT Treatment Interventions  PT Goals (Current goals can be found in the Care Plan section)  Acute Rehab PT Goals PT Goal Formulation: All assessment and education complete, DC therapy    Frequency     Barriers to discharge        Co-evaluation               AM-PAC PT "6 Clicks" Daily Activity  Outcome Measure Difficulty turning over in bed (including adjusting bedclothes, sheets and blankets)?: None Difficulty moving from lying on back to sitting on the side of the bed? : None Difficulty sitting down on and standing up from a chair with arms (e.g., wheelchair, bedside commode, etc,.)?: None Help needed moving to and from a bed to chair (including a wheelchair)?: None Help needed walking in hospital room?: A Little Help needed climbing 3-5 steps with a railing? : A Little 6 Click Score: 22     End of Session Equipment Utilized During Treatment: Gait belt Activity Tolerance: Patient tolerated treatment well Patient left: in bed;with call bell/phone within reach   PT Visit Diagnosis: Difficulty in walking, not elsewhere classified (R26.2)    Time: 4383-8184 PT Time Calculation (min) (ACUTE ONLY): 24 min   Charges:   PT Evaluation $PT Eval Low Complexity: [place], PT, DPT Acute Rehabilitation Services Office: [phone] Pager: [phone]  Trena Platt 11/22/2017, 12:15 PM

## 2017-11-22 NOTE — Progress Notes (Signed)
PROGRESS NOTE  Matthew Lewis SVX:[id number] DOB: [birth year] DOA: 11/20/2017 PCP: [name], MD  HPI/Recap of past 24 hours: HPI from [name] is a 62 y.o. male with medical history significant for metastatic cancer with involvement of the liver, adrenal gland, cirrhosisdue to alcohol abuse, alcohol abuse now in remission for the past 6 years, stage II descending colon cancer in 2007,s/pleft hemicolectomy, DM 2, CAD sp angioplasty in 2000 presents to the ED from our cancer clinic (for his chemotherapy) due to hypotension. Pt  had a recent admission for sepsis, segmental colitis, obstructive jaundice.  Patient has been having generalized weakness without any other significant symptoms. Of note, pt was seen by GI and IR, and underwent percutaneous biliary drainage tube placementon 10/26/17. He underwent CT biopsy of right adrenal mass on 10/29/17 that confirms metastatic adenocarcinoma with morphology that favors colorectal primary over pancreatobiliary. In the ED he was found to be hypotensive, hyponatremia, and elevated BUN/creatinine. He was given IV fluids with which his blood pressure improved.  He was started on empiric IV vancomycin, cefepime, Flagyl. Pt admitted for further management.   Today, pt reported feeling much better. Denies any new complaints.   Assessment/Plan: Active Problems:   Hyperlipemia   Diabetes mellitus ([place])   CAD (coronary artery disease)- PCI 2001- no evaluation since   Hyponatremia   Alcoholic cirrhosis of liver without ascites (HCC)   Malignant neoplasm metastatic to adrenal gland (HCC)   Intrahepatic cholangiocarcinoma (HCC)   Hypotension   Acute renal failure (ARF) (HCC)  Hypotension likely 2/2 dehydration Vs ?sepsis On presentation, afebrile, no leukocytosis, LA 2 Currently afebrile, with no leukocytosis BP improving, but still soft LA 2-->1.5 Procalcitonin 0.40-->0.27 UA negative, asymptomatic BC x2  NGTD Chest x-ray unremarkable Discontinue empiric IV vancomycin, cefepime, Flagyl Monitor closely for another 24H off AB  Hyponatremia Improving status post IV fluids Daily BMP  Hyperkalemia Resolving  Potassium 5.3-->5.0 Status post Kayexalate on 11/21/17 Daily BMP  AKI Resolved status post IV fluid  Anemia of chronic disease due to malignancy Around baseline Daily CBC  Diabetes mellitus type 2 Last A1c 4.9 on 11/09/2017 SSI, Accu-Cheks Hold home regimen  Obstructive jaundice status post percutaneous biliary drainage/liver cirrhosis secondary to alcohol/NASH Chronically elevated liver enzymes Follows with GI  Metastatic adenocarcinoma to right adrenal gland, with multiple liver and lung masses, retroperitoneal adenopathy, likely metastatic cholangiocarcinoma Follows with Dr.Feng, on board Dr Burr Medico discussed CODE STATUS with patient, agreed to be DNR    Code Status: DNR  Family Communication: None at bedside  Disposition Plan: Likely home on 11/23/17   Consultants:  Oncology  Procedures:  None  Antimicrobials: None  DVT prophylaxis: Lovenox   Objective: Vitals:   11/21/17 2115 11/22/17 0449 11/22/17 0757 11/22/17 1400  BP: 104/69 93/66  101/64  Pulse: 91 89  88  Resp: 18 18    Temp: 98.2 F (36.8 C) 97.9 F (36.6 C)  98.2 F (36.8 C)  TempSrc: Oral Oral  Oral  SpO2: 99% 99% 99% 100%  Weight:      Height:        Intake/Output Summary (Last 24 hours) at 11/22/2017 1623 Last data filed at 11/22/2017 1500 Gross per 24 hour  Intake 4208.68 ml  Output 4195 ml  Net 13.68 ml   Filed Weights   11/20/17 2115  Weight: 94.3 kg    Exam:  General: NAD   Cardiovascular: S1, S2 present  Respiratory: CTAB  Abdomen: Soft, nontender, nondistended, bowel  sounds present, percutaneous biliary drain on the R side  Musculoskeletal: No bilateral pedal edema noted  Skin: Normal  Psychiatry: Normal mood   Data Reviewed: CBC: Recent Labs   Lab 11/20/17 0812 11/20/17 1500 11/20/17 2043 11/21/17 0545 11/22/17 0518  WBC 10.1 10.3 8.8 7.1 5.0  NEUTROABS 7.1 7.2  --  5.6 3.2  HGB 11.2* 9.8* 10.4* 9.7* 9.3*  HCT 33.7* 31.1* 33.8* 31.4* 30.0*  MCV 88.9 95.7 97.1 95.7 94.9  PLT 584* 548* 336 312 229   Basic Metabolic Panel: Recent Labs  Lab 11/20/17 0812 11/20/17 1500 11/20/17 2043 11/21/17 0545 11/22/17 0518  NA 124* 125*  --  130* 131*  K 5.6* 5.3*  --  5.3* 5.0  CL 95* 99  --  107 104  CO2 15* 17*  --  17* 22  GLUCOSE 182* 83  --  98 98  BUN 58* 61*  --  40* 18  CREATININE 1.71* 1.36* 0.98 0.74 0.64  CALCIUM 10.1 8.8*  --  8.8* 8.6*   GFR: Estimated Creatinine Clearance: 111.2 mL/min (by C-G formula based on SCr of 0.64 mg/dL). Liver Function Tests: Recent Labs  Lab 11/20/17 0812 11/20/17 1500 11/21/17 0545  AST 41 40 36  ALT 32 32 29  ALKPHOS 638* 502* 428*  BILITOT 2.3* 1.9* 2.1*  PROT 9.1* 8.4* 7.6  ALBUMIN 2.5* 2.5* 2.3*   Recent Labs  Lab 11/20/17 1500  LIPASE 69*   Recent Labs  Lab 11/20/17 1637  AMMONIA 20   Coagulation Profile: Recent Labs  Lab 11/20/17 1500  INR 1.22   Cardiac Enzymes: No results for input(s): CKTOTAL, CKMB, CKMBINDEX, TROPONINI in the last 168 hours. BNP (last 3 results) No results for input(s): PROBNP in the last 8760 hours. HbA1C: No results for input(s): HGBA1C in the last 72 hours. CBG: Recent Labs  Lab 11/21/17 0757 11/21/17 1123 11/21/17 1710 11/22/17 0814 11/22/17 1148  GLUCAP 73 104* 126* 82 121*   Lipid Profile: No results for input(s): CHOL, HDL, LDLCALC, TRIG, CHOLHDL, LDLDIRECT in the last 72 hours. Thyroid Function Tests: No results for input(s): TSH, T4TOTAL, FREET4, T3FREE, THYROIDAB in the last 72 hours. Anemia Panel: No results for input(s): VITAMINB12, FOLATE, FERRITIN, TIBC, IRON, RETICCTPCT in the last 72 hours. Urine analysis:    Component Value Date/Time   COLORURINE YELLOW 11/20/2017 1653   APPEARANCEUR HAZY (A)  11/20/2017 1653   LABSPEC 1.011 11/20/2017 1653   PHURINE 5.0 11/20/2017 1653   GLUCOSEU 150 (A) 11/20/2017 1653   HGBUR NEGATIVE 11/20/2017 1653   BILIRUBINUR NEGATIVE 11/20/2017 1653   KETONESUR NEGATIVE 11/20/2017 1653   PROTEINUR NEGATIVE 11/20/2017 1653   UROBILINOGEN 0.2 08/15/2011 1816   NITRITE NEGATIVE 11/20/2017 1653   LEUKOCYTESUR NEGATIVE 11/20/2017 1653   Sepsis Labs: @LABRCNTIP (procalcitonin:4,lacticidven:4)  ) Recent Results (from the past 240 hour(s))  Body fluid culture     Status: None (Preliminary result)   Collection Time: 11/20/17  9:07 AM  Result Value Ref Range Status   Specimen Description   Final    WOUND Performed at [facility], [place] [address] [place], [place] [address]    Special Requests   Final    NONE Performed at [facility], [place] [address] [place], [place] [address]    Gram Stain NO WBC SEEN RARE GRAM NEGATIVE RODS   Final   Culture   Final    ABUNDANT KLEBSIELLA OXYTOCA IDENTIFICATION AND SUSCEPTIBILITIES TO FOLLOW Performed at [facility],  [address], [place], [place] [address]    Report Status PENDING  Incomplete  Culture, Blood     Status: None (Preliminary result)   Collection Time: 11/20/17 10:04 AM  Result Value Ref Range Status   Specimen Description   Final    BLOOD LEFT HAND Performed at [facility], [place] [address], [place], [place] [address]    Special Requests   Final    NONE Performed at [facility], [place] [address], [place], [place] [address]    Culture   Final    NO GROWTH 2 DAYS Performed at [place] [address], [place], [place] [address]    Report Status PENDING  Incomplete  Culture, Blood     Status: None (Preliminary result)   Collection Time: 11/20/17 10:17 AM  Result Value Ref Range Status   Specimen Description   Final    BLOOD RIGHT HAND Performed at [facility], [place] [address] [place], [place] [address]    Special Requests   Final    NONE Performed at [facility], [place] [address] [place], [place] [address]    Culture   Final    NO GROWTH 2 DAYS Performed at [place] [address], [place], [place] [address]    Report Status PENDING  Incomplete  Anaerobic culture     Status: None (Preliminary result)   Collection Time: 11/20/17 10:53 AM  Result Value Ref Range Status   Specimen Description   Final    BILE Performed at [facility], [place] [address], [place], [place] [address]    Special Requests   Final    NONE Performed at [facility], [place] [address] [place], [place] [address]    Gram Stain PENDING  Incomplete   Culture   Final    CULTURE REINCUBATED FOR BETTER GROWTH Performed at [facility], [place] [address] [place], [place] [address]    Report Status PENDING  Incomplete      Studies: No results found.  Scheduled Meds: . aspirin EC  81 mg Oral Daily  . atorvastatin  20 mg Oral q1800  . celecoxib  200 mg Oral QHS  . feeding supplement (ENSURE ENLIVE)  237 mL Oral BID BM  . gabapentin  400 mg Oral q morning - 10a  . gabapentin  800 mg Oral QHS  . heparin  5,000 Units Subcutaneous Q12H  . insulin aspart  0-9 Units Subcutaneous TID WC  . multivitamin with minerals  1 tablet Oral Daily  . sodium chloride flush  10 mL Intracatheter Q12H    Continuous Infusions: . sodium chloride 100 mL/hr at 11/22/17 0035  . ceFEPime (MAXIPIME) IV 1 g (11/22/17 1011)  . metronidazole 500 mg (11/22/17 0837)  . vancomycin 1,250 mg (11/22/17 1349)     LOS: 2 days     Alma Friendly, MD [facility]   If 7PM-7AM, please contact night-coverage [email or web address] 11/22/2017, 4:23 PM

## 2017-11-23 LAB — BASIC METABOLIC PANEL
Anion gap: 8 (ref 5–15)
BUN: 11 mg/dL (ref 8–23)
CO2: 19 mmol/L — ABNORMAL LOW (ref 22–32)
Calcium: 8.4 mg/dL — ABNORMAL LOW (ref 8.9–10.3)
Chloride: 105 mmol/L (ref 98–111)
Creatinine, Ser: 0.44 mg/dL — ABNORMAL LOW (ref 0.61–1.24)
GFR calc Af Amer: >60 mL/min (ref >60)
GLUCOSE: 124 mg/dL — AB (ref 70–99)
POTASSIUM: 4.8 mmol/L (ref 3.5–5.1)
Sodium: 132 mmol/L — ABNORMAL LOW (ref 135–145)

## 2017-11-23 LAB — BODY FLUID CULTURE: Gram Stain: NONE SEEN

## 2017-11-23 LAB — GLUCOSE, CAPILLARY
GLUCOSE-CAPILLARY: 111 mg/dL — AB (ref 70–99)
Glucose-Capillary: 119 mg/dL — ABNORMAL HIGH (ref 70–99)
Glucose-Capillary: 95 mg/dL (ref 70–99)
Glucose-Capillary: 126 mg/dL — ABNORMAL HIGH (ref 70–99)

## 2017-11-23 LAB — PROCALCITONIN: PROCALCITONIN: 0.22 ng/mL

## 2017-11-23 MED ORDER — SODIUM CHLORIDE 0.9 % IV SOLN
2.0000 g | INTRAVENOUS | Status: DC
  Administered 2017-11-23: 2 g via INTRAVENOUS
  Filled 2017-11-23: qty 20
  Filled 2017-11-23: qty 2

## 2017-11-23 NOTE — Progress Notes (Signed)
These results were called to [name] and were reviewed with him . His were answered. He expressed understanding.

## 2017-11-23 NOTE — Progress Notes (Signed)
These preliminary result these preliminary results were noted.  Awaiting final report.

## 2017-11-23 NOTE — Progress Notes (Signed)
PROGRESS NOTE  Matthew Lewis [id number] DOB: Aug 29, 1955 DOA: 11/20/2017 PCP: [name], MD  HPI/Recap of past 24 hours: HPI from Dr Matthew Lewis is a 62 y.o. male with medical history significant for metastatic cancer with involvement of the liver, adrenal gland, cirrhosisdue to alcohol abuse, alcohol abuse now in remission for the past 6 years, stage II descending colon cancer in 2007,s/pleft hemicolectomy, DM 2, CAD sp angioplasty in 2000 presents to the ED from our cancer clinic (for his chemotherapy) due to hypotension. Pt  had a recent admission for sepsis, segmental colitis, obstructive jaundice.  Patient has been having generalized weakness without any other significant symptoms. Of note, pt was seen by GI and IR, and underwent percutaneous biliary drainage tube placementon 10/26/17. He underwent CT biopsy of right adrenal mass on 10/29/17 that confirms metastatic adenocarcinoma with morphology that favors colorectal primary over pancreatobiliary. In the ED he was found to be hypotensive, hyponatremia, and elevated BUN/creatinine. He was given IV fluids with which his blood pressure improved.  He was started on empiric IV vancomycin, cefepime, Flagyl. Pt admitted for further management.   Today, pt denies any new complaints.  Assessment/Plan: Active Problems:   Hyperlipemia   Diabetes mellitus ([place])   CAD (coronary artery disease)- PCI 2001- no evaluation since   Hyponatremia   Alcoholic cirrhosis of liver without ascites (HCC)   Malignant neoplasm metastatic to adrenal gland (HCC)   Intrahepatic cholangiocarcinoma (HCC)   Hypotension   Acute renal failure (ARF) (HCC)  Hypotension likely 2/2 dehydration Vs ?sepsis On presentation, afebrile, no leukocytosis, LA 2 Currently afebrile, with no leukocytosis BP stable LA 2-->1.5 Procalcitonin 0.40-->0.27-->0.22 UA negative, asymptomatic BC x2 NGTD Wound Cx growing abundant Klebsiella  oxytoca, and Enterobacter cloacae both sensitive to ceftriaxone Bile culture still pending, will await before discharge Chest x-ray unremarkable Start IV Cetriaxone S/P empiric IV vancomycin, cefepime, Flagyl  Hyponatremia Improving status post IV fluids Daily BMP  Hyperkalemia Resolving  Potassium 5.3-->5.0 Status post Kayexalate on 11/21/17 Daily BMP  AKI Resolved status post IV fluid  Anemia of chronic disease due to malignancy Around baseline Daily CBC  Diabetes mellitus type 2 Last A1c 4.9 on 11/09/2017 SSI, Accu-Cheks Hold home regimen  Obstructive jaundice status post percutaneous biliary drainage/liver cirrhosis secondary to alcohol/NASH Chronically elevated liver enzymes Follows with GI  Metastatic adenocarcinoma to right adrenal gland, with multiple liver and lung masses, retroperitoneal adenopathy, likely metastatic cholangiocarcinoma Follows with Dr.Feng, on board Dr Burr Medico discussed CODE STATUS with patient, agreed to be DNR    Code Status: DNR  Family Communication: Friend at bedside  Disposition Plan: Likely home on 11/24/17, pending culture   Consultants:  Oncology  Procedures:  None  Antimicrobials: Ceftriaxone  DVT prophylaxis: Lovenox   Objective: Vitals:   11/22/17 1400 11/22/17 2041 11/23/17 0508 11/23/17 1317  BP: 101/64 118/74 96/62 108/64  Pulse: 88 91 84 88  Resp:  18 18   Temp: 98.2 F (36.8 C) 98.4 F (36.9 C) 97.7 F (36.5 C) 98.5 F (36.9 C)  TempSrc: Oral Oral Oral Oral  SpO2: 100% 100% 100% 100%  Weight:      Height:        Intake/Output Summary (Last 24 hours) at 11/23/2017 1431 Last data filed at 11/23/2017 1358 Gross per 24 hour  Intake 2609.98 ml  Output 4600 ml  Net -1990.02 ml   Filed Weights   11/20/17 2115  Weight: 94.3 kg    Exam:  General: NAD  Cardiovascular: S1, S2 present  Respiratory: CTAB  Abdomen: Soft, nontender, nondistended, bowel sounds present, percutaneous biliary drain  on the right side  Musculoskeletal: No bilateral pedal edema noted  Skin: Normal  Psychiatry:  Normal mood   Data Reviewed: CBC: Recent Labs  Lab 11/20/17 0812 11/20/17 1500 11/20/17 2043 11/21/17 0545 11/22/17 0518  WBC 10.1 10.3 8.8 7.1 5.0  NEUTROABS 7.1 7.2  --  5.6 3.2  HGB 11.2* 9.8* 10.4* 9.7* 9.3*  HCT 33.7* 31.1* 33.8* 31.4* 30.0*  MCV 88.9 95.7 97.1 95.7 94.9  PLT 584* 548* 336 312 333   Basic Metabolic Panel: Recent Labs  Lab 11/20/17 0812 11/20/17 1500 11/20/17 2043 11/21/17 0545 11/22/17 0518 11/23/17 0535  NA 124* 125*  --  130* 131* 132*  K 5.6* 5.3*  --  5.3* 5.0 4.8  CL 95* 99  --  107 104 105  CO2 15* 17*  --  17* 22 19*  GLUCOSE 182* 83  --  98 98 124*  BUN 58* 61*  --  40* 18 11  CREATININE 1.71* 1.36* 0.98 0.74 0.64 0.44*  CALCIUM 10.1 8.8*  --  8.8* 8.6* 8.4*   GFR: Estimated Creatinine Clearance: 111.2 mL/min (A) (by C-G formula based on SCr of 0.44 mg/dL (L)). Liver Function Tests: Recent Labs  Lab 11/20/17 0812 11/20/17 1500 11/21/17 0545  AST 41 40 36  ALT 32 32 29  ALKPHOS 638* 502* 428*  BILITOT 2.3* 1.9* 2.1*  PROT 9.1* 8.4* 7.6  ALBUMIN 2.5* 2.5* 2.3*   Recent Labs  Lab 11/20/17 1500  LIPASE 69*   Recent Labs  Lab 11/20/17 1637  AMMONIA 20   Coagulation Profile: Recent Labs  Lab 11/20/17 1500  INR 1.22   Cardiac Enzymes: No results for input(s): CKTOTAL, CKMB, CKMBINDEX, TROPONINI in the last 168 hours. BNP (last 3 results) No results for input(s): PROBNP in the last 8760 hours. HbA1C: No results for input(s): HGBA1C in the last 72 hours. CBG: Recent Labs  Lab 11/22/17 0814 11/22/17 1148 11/22/17 2039 11/23/17 0729 11/23/17 1137  GLUCAP 82 121* 141* 111* 119*   Lipid Profile: No results for input(s): CHOL, HDL, LDLCALC, TRIG, CHOLHDL, LDLDIRECT in the last 72 hours. Thyroid Function Tests: No results for input(s): TSH, T4TOTAL, FREET4, T3FREE, THYROIDAB in the last 72 hours. Anemia Panel: No  results for input(s): VITAMINB12, FOLATE, FERRITIN, TIBC, IRON, RETICCTPCT in the last 72 hours. Urine analysis:    Component Value Date/Time   COLORURINE YELLOW 11/20/2017 1653   APPEARANCEUR HAZY (A) 11/20/2017 1653   LABSPEC 1.011 11/20/2017 1653   PHURINE 5.0 11/20/2017 1653   GLUCOSEU 150 (A) 11/20/2017 1653   HGBUR NEGATIVE 11/20/2017 1653   BILIRUBINUR NEGATIVE 11/20/2017 1653   KETONESUR NEGATIVE 11/20/2017 1653   PROTEINUR NEGATIVE 11/20/2017 1653   UROBILINOGEN 0.2 08/15/2011 1816   NITRITE NEGATIVE 11/20/2017 1653   LEUKOCYTESUR NEGATIVE 11/20/2017 1653   Sepsis Labs: @LABRCNTIP (procalcitonin:4,lacticidven:4)  ) Recent Results (from the past 240 hour(s))  Body fluid culture     Status: None   Collection Time: 11/20/17  9:07 AM  Result Value Ref Range Status   Specimen Description   Final    WOUND Performed at [facility], [place] [address], [place], [place] [address]    Special Requests   Final    NONE Performed at [facility], [place] [address], [place], [place] [address]    Gram Stain   Final    NO WBC SEEN  RARE GRAM NEGATIVE RODS Performed at [facility], [place] [address] [place], [place] [address]    Culture   Final    ABUNDANT KLEBSIELLA OXYTOCA ABUNDANT ENTEROBACTER CLOACAE    Report Status 11/23/2017 FINAL  Final   Organism ID, Bacteria KLEBSIELLA OXYTOCA  Final   Organism ID, Bacteria ENTEROBACTER CLOACAE  Final      Susceptibility   Enterobacter cloacae - MIC*    CEFAZOLIN >=64 RESISTANT Resistant     CEFEPIME <=1 SENSITIVE Sensitive     CEFTAZIDIME <=1 SENSITIVE Sensitive     CEFTRIAXONE <=1 SENSITIVE Sensitive     CIPROFLOXACIN <=0.25 SENSITIVE Sensitive     GENTAMICIN <=1 SENSITIVE Sensitive     IMIPENEM <=0.25 SENSITIVE Sensitive     TRIMETH/SULFA <=20 SENSITIVE Sensitive     PIP/TAZO <=4 SENSITIVE Sensitive     * ABUNDANT ENTEROBACTER CLOACAE   Klebsiella oxytoca - MIC*    AMPICILLIN  RESISTANT Resistant     CEFAZOLIN <=4 SENSITIVE Sensitive     CEFEPIME <=1 SENSITIVE Sensitive     CEFTAZIDIME <=1 SENSITIVE Sensitive     CEFTRIAXONE <=1 SENSITIVE Sensitive     CIPROFLOXACIN <=0.25 SENSITIVE Sensitive     GENTAMICIN <=1 SENSITIVE Sensitive     IMIPENEM <=0.25 SENSITIVE Sensitive     TRIMETH/SULFA <=20 SENSITIVE Sensitive     AMPICILLIN/SULBACTAM 4 SENSITIVE Sensitive     PIP/TAZO <=4 SENSITIVE Sensitive     Extended ESBL NEGATIVE Sensitive     * ABUNDANT KLEBSIELLA OXYTOCA  Culture, Blood     Status: None (Preliminary result)   Collection Time: 11/20/17 10:04 AM  Result Value Ref Range Status   Specimen Description   Final    BLOOD LEFT HAND Performed at [facility], [place] [address], [place], [place] [address]    Special Requests   Final    NONE Performed at [facility], [place] [address], [place], [place] [address]    Culture   Final    NO GROWTH 3 DAYS Performed at [facility], [place] [address] [place], [place] [address]    Report Status PENDING  Incomplete  Culture, Blood     Status: None (Preliminary result)   Collection Time: 11/20/17 10:17 AM  Result Value Ref Range Status   Specimen Description   Final    BLOOD RIGHT HAND Performed at [facility], [place] [address] [place], [place] [address]    Special Requests   Final    NONE Performed at [facility], [place] [address] [place], [place] [address]    Culture   Final    NO GROWTH 3 DAYS Performed at [facility], [place] [address], [place], [place] [address]    Report Status PENDING  Incomplete  Anaerobic culture     Status: None (Preliminary result)   Collection Time: 11/20/17 10:53 AM  Result Value Ref Range Status   Specimen Description   Final    BILE Performed at [facility], [place] [address] [place], [place] [address]    Special Requests   Final     NONE Performed at [facility], [place] [address] [place], [place] [address]    Gram Stain PENDING  Incomplete   Culture   Final    CULTURE REINCUBATED FOR BETTER GROWTH Performed at [facility], [place] [address], [place], [place] [address]    Report Status  PENDING  Incomplete      Studies: No results found.  Scheduled Meds: . aspirin EC  81 mg Oral Daily  . atorvastatin  20 mg Oral q1800  . celecoxib  200 mg Oral QHS  . enoxaparin (LOVENOX) injection  40 mg Subcutaneous Q24H  . feeding supplement (ENSURE ENLIVE)  237 mL Oral BID BM  . gabapentin  400 mg Oral q morning - 10a  . gabapentin  800 mg Oral QHS  . insulin aspart  0-9 Units Subcutaneous TID WC  . multivitamin with minerals  1 tablet Oral Daily  . sodium chloride flush  10 mL Intracatheter Q12H    Continuous Infusions: . sodium chloride 100 mL/hr at 11/23/17 0600  . cefTRIAXone (ROCEPHIN)  IV       LOS: 3 days     Alma Friendly, MD [facility]   If 7PM-7AM, please contact night-coverage [email or web address] 11/23/2017, 2:31 PM

## 2017-11-24 DIAGNOSIS — I2510 Atherosclerotic heart disease of native coronary artery without angina pectoris: Secondary | ICD-10-CM

## 2017-11-24 LAB — BASIC METABOLIC PANEL
ANION GAP: 9 (ref 5–15)
BUN: 15 mg/dL (ref 8–23)
CHLORIDE: 105 mmol/L (ref 98–111)
CO2: 18 mmol/L — ABNORMAL LOW (ref 22–32)
Calcium: 8.8 mg/dL — ABNORMAL LOW (ref 8.9–10.3)
Creatinine, Ser: 0.66 mg/dL (ref 0.61–1.24)
GFR calc non Af Amer: >60 mL/min (ref >60)
Glucose, Bld: 100 mg/dL — ABNORMAL HIGH (ref 70–99)
Potassium: 4.4 mmol/L (ref 3.5–5.1)
SODIUM: 132 mmol/L — AB (ref 135–145)

## 2017-11-24 LAB — CBC WITH DIFFERENTIAL/PLATELET
ABS IMMATURE GRANULOCYTES: 0.02 10*3/uL (ref 0.00–0.07)
Basophils Absolute: 0 10*3/uL (ref 0.0–0.1)
Basophils Relative: 1 %
Eosinophils Absolute: 0.5 10*3/uL (ref 0.0–0.5)
Eosinophils Relative: 9 %
HCT: 31.8 % — ABNORMAL LOW (ref 39.0–52.0)
HEMOGLOBIN: 10 g/dL — AB (ref 13.0–17.0)
IMMATURE GRANULOCYTES: 0 %
LYMPHS ABS: 1.2 10*3/uL (ref 0.7–4.0)
LYMPHS PCT: 20 %
MCH: 30.3 pg (ref 26.0–34.0)
MCHC: 31.4 g/dL (ref 30.0–36.0)
MCV: 96.4 fL (ref 80.0–100.0)
MONO ABS: 0.6 10*3/uL (ref 0.1–1.0)
MONOS PCT: 9 %
NEUTROS ABS: 3.8 10*3/uL (ref 1.7–7.7)
NEUTROS PCT: 61 %
Platelets: 247 10*3/uL (ref 150–400)
RBC: 3.3 MIL/uL — ABNORMAL LOW (ref 4.22–5.81)
RDW: 15.2 % (ref 11.5–15.5)
WBC: 6.2 10*3/uL (ref 4.0–10.5)
nRBC: 0 % (ref 0.0–0.2)

## 2017-11-24 LAB — GLUCOSE, CAPILLARY
Glucose-Capillary: 131 mg/dL — ABNORMAL HIGH (ref 70–99)
Glucose-Capillary: 113 mg/dL — ABNORMAL HIGH (ref 70–99)

## 2017-11-24 MED ORDER — CEFPODOXIME PROXETIL 200 MG PO TABS: 200.0000 mg | ORAL_TABLET | Freq: Two times a day (BID) | ORAL | 0 refills | Status: AC

## 2017-11-24 NOTE — Discharge Summary (Signed)
Discharge Summary  Matthew Lewis LGX:[id number] DOB: 1955-08-02  PCP: [name], MD  Admit date: 11/20/2017 Discharge date: 11/24/2017  Time spent: 35 mins  Recommendations for Outpatient Follow-up:  1. PCP 2. Oncology  Discharge Diagnoses:  Active Hospital Problems   Diagnosis Date Noted  . Hypotension 11/20/2017  . Acute renal failure (ARF) ([place]) 11/20/2017  . Intrahepatic cholangiocarcinoma ([place]) 11/06/2017  . Malignant neoplasm metastatic to adrenal gland ([place])   . Alcoholic cirrhosis of liver without ascites ([place]) 02/24/2017  . Hyponatremia 06/12/2016  . Diabetes mellitus ([place]) 11/30/2012  . Hyperlipemia 11/30/2012  . CAD (coronary artery disease)- PCI 2001- no evaluation since 11/30/2012    Resolved Hospital Problems  No resolved problems to display.    Discharge Condition: Stable  Diet recommendation: Mod carb, heart healthy  Vitals:   11/23/17 2044 11/24/17 0507  BP: (!) 96/57 100/68  Pulse: 97 85  Resp: 18 18  Temp: 98.3 F (36.8 C) 97.8 F (36.6 C)  SpO2: 98% 100%    History of present illness:  Matthew H Gardneris a 62 y.o.malewith medical history significant for metastatic cancer with involvement of the liver, adrenal gland, cirrhosisdue to alcohol abuse, alcohol abuse now in remission for the past6years, stage II descending colon cancer in 2007,s/pleft hemicolectomy, DM 2,CAD sp angioplasty in 2000 presents to the ED from our cancer clinic (for his chemotherapy) due to hypotension. Pt  had a recent admission for sepsis, segmental colitis, obstructive jaundice. Patient has been having generalized weakness without any other significant symptoms. Of note, pt was seen by GI and IR, and underwent percutaneous biliary drainage tube placementon 10/26/17. He underwent CT biopsy of right adrenal mass on 10/29/17 that confirms metastatic adenocarcinoma with morphology that favors colorectal primary over pancreatobiliary. In the ED he was  found to be hypotensive, hyponatremia, and elevated BUN/creatinine. He was given IV fluids with which hisblood pressure improved. He was started on empiric IV vancomycin, cefepime, Flagyl. Pt admitted for further management.   Today, pt reported feeling better, denies any new complaints. Very eager to be discharged. Advised close follow up with PCP, oncology and GI.  Hospital Course:  Active Problems:   Hyperlipemia   Diabetes mellitus ([place])   CAD (coronary artery disease)- PCI 2001- no evaluation since   Hyponatremia   Alcoholic cirrhosis of liver without ascites ([facility])   Malignant neoplasm metastatic to adrenal gland ([facility])   Intrahepatic cholangiocarcinoma ([facility])   Hypotension   Acute renal failure (ARF) ([facility])  Hypotension likely 2/2 dehydration Vs ?sepsis On presentation, afebrile, no leukocytosis, LA 2 Currently afebrile, with no leukocytosis BP stable LA 2-->1.5 Procalcitonin 0.40-->0.27-->0.22 UA negative, asymptomatic BC x2 NGTD Wound Cx growing abundant Klebsiella oxytoca, and Enterobacter cloacae both sensitive to ceftriaxone of which patient received a dose. Due to pt being very stable, will d/c him on PO Cefpodoxime for an additional 6 days for a complete 7 days of AB (excluding empiric AB he received for 2 days) Bile anaerobic culture pending, it has been negative for 4 days now, unlikely to grow anything as per microbiology lab technician. Oncology will follow up Chest x-ray unremarkable S/P empiric IV vancomycin, cefepime, Flagyl  Hyponatremia Improving status post IV fluids PCP to follow up  Hyperkalemia Resolved Status post Kayexalate on 11/21/17 PCP to follow up  AKI Resolved status post IV fluid  Anemia of chronic disease due to malignancy Around baseline Daily CBC  Diabetes mellitus type 2 Last A1c 4.9 on 11/09/2017 Continue home regimen  Obstructive jaundice status  post percutaneous biliary drainage/liver cirrhosis secondary to  alcohol/NASH Chronically elevated liver enzymes Follow up with GI  Metastatic adenocarcinoma to right adrenal gland, with multiple liver and lung masses, retroperitoneal adenopathy, likely metastatic cholangiocarcinoma Follows with Dr.Feng, on board Dr Burr Medico discussed CODE STATUS with patient, agreed to be DNR Follow up    Procedures:  None   Consultations:  Oncology  Discharge Exam: BP 100/68 (BP Location: Right Arm)   Pulse 85   Temp 97.8 F (36.6 C) (Oral)   Resp 18   Ht 6' (1.829 m)   Wt 94.3 kg   SpO2 100%   BMI 28.20 kg/m   General: NAD Cardiovascular: S1, S2 present Respiratory: CTAB  Discharge Instructions You were cared for by a hospitalist during your hospital stay. If you have any questions about your discharge medications or the care you received while you were in the hospital after you are discharged, you can call the unit and asked to speak with the hospitalist on call if the hospitalist that took care of you is not available. Once you are discharged, your primary care physician will handle any further medical issues. Please note that NO REFILLS for any discharge medications will be authorized once you are discharged, as it is imperative that you return to your primary care physician (or establish a relationship with a primary care physician if you do not have one) for your aftercare needs so that they can reassess your need for medications and monitor your lab values.   Allergies as of 11/24/2017      Reactions   Morphine And Related Other (See Comments)   PATIENT IS RECOVERING FROM DRUG ADDICTION PATIENT WANTS TO AVOID ANY NARCOTICS   Other Other (See Comments)   PATIENT IS RECOVERING FROM DRUG ADDICTION PATIENT WANTS TO AVOID ANY NARCOTICS      Medication List    STOP taking these medications   ALPRAZolam 0.5 MG tablet Commonly known as:  XANAX     TAKE these medications   ALLEGRA-D 24 HOUR PO Take 1 tablet by mouth every evening. WAL/FEX    aspirin EC 81 MG tablet Take 81 mg by mouth daily.   atorvastatin 40 MG tablet Commonly known as:  LIPITOR Take 1 tablet (40 mg total) by mouth daily at 6 PM. What changed:  how much to take   calcium carbonate 500 MG chewable tablet Commonly known as:  TUMS - dosed in mg elemental calcium Chew 1 tablet by mouth 2 (two) times daily as needed for indigestion or heartburn.   cefpodoxime 200 MG tablet Commonly known as:  VANTIN Take 1 tablet (200 mg total) by mouth 2 (two) times daily for 6 days.   celecoxib 200 MG capsule Commonly known as:  CELEBREX Take 1 capsule by mouth at bedtime.   gabapentin 400 MG capsule Commonly known as:  NEURONTIN Take 400-800 mg by mouth 2 (two) times daily. 1 capsule am, 2 capsules pm   glipiZIDE 5 MG tablet Commonly known as:  GLUCOTROL Take 5 mg by mouth 2 (two) times daily before a meal.   JARDIANCE 10 MG Tabs tablet Generic drug:  empagliflozin Take 10 mg by mouth at bedtime.   LEVEMIR FLEXPEN 100 UNIT/ML Pen Generic drug:  Insulin Detemir Inject 10-60 Units into the skin 2 (two) times daily. Take 62 units in the am and Take 10 units in the pm   lisinopril 20 MG tablet Commonly known as:  PRINIVIL,ZESTRIL Take 10 mg by mouth daily.  metFORMIN 1000 MG tablet Commonly known as:  GLUCOPHAGE Take 1,000 mg by mouth 2 (two) times daily with a meal.   multivitamin tablet Take 1 tablet by mouth daily.   ondansetron 8 MG tablet Commonly known as:  ZOFRAN Take 1 tablet (8 mg total) by mouth 2 (two) times daily as needed. Start on the third day after chemotherapy.   Oxycodone HCl 10 MG Tabs Take 1 tablet (10 mg total) by mouth every 4 (four) hours as needed for severe pain.   polyvinyl alcohol 1.4 % ophthalmic solution Commonly known as:  LIQUIFILM TEARS Place 1 drop into both eyes daily as needed for dry eyes.   prochlorperazine 10 MG tablet Commonly known as:  COMPAZINE Take 1 tablet (10 mg total) by mouth every 6 (six) hours as  needed (Nausea or vomiting).      Allergies  Allergen Reactions  . Morphine And Related Other (See Comments)    PATIENT IS RECOVERING FROM DRUG ADDICTION PATIENT WANTS TO AVOID ANY NARCOTICS  . Other Other (See Comments)    PATIENT IS RECOVERING FROM DRUG ADDICTION PATIENT WANTS TO AVOID ANY NARCOTICS   Follow-up Information    [name], MD. Schedule an appointment as soon as possible for a visit in 1 week(s).   Specialty:  Internal Medicine Contact information: [address] [place] [address] [phone]            The results of significant diagnostics from this hospitalization (including imaging, microbiology, ancillary and laboratory) are listed below for reference.    Significant Diagnostic Studies: Dg Chest 2 View  Result Date: 11/08/2017 CLINICAL DATA:  62 year old male with sepsis. EXAM: CHEST - 2 VIEW COMPARISON:  Chest CT dated 10/27/2017 FINDINGS: There is cardiomegaly with probable mild vascular congestion. No focal consolidation or pneumothorax. Bilateral lung consolidation/masses seen on the prior CT are suboptimally visualized on this radiograph. Possible trace right pleural effusion. No acute osseous pathology. A pigtail tube is noted in the upper abdomen likely corresponding to the percutaneous biliary drain catheter. IMPRESSION: 1. Cardiomegaly with probable mild vascular congestion. 2. Suboptimal visualization of the pulmonary masses/nodules seen on the prior CT. 3. Probable small right pleural effusion. Electronically Signed   By: Anner Crete M.D.   On: 11/08/2017 21:22   Ct Chest Wo Contrast  Result Date: 10/27/2017 CLINICAL DATA:  Multifocal liver lesions.  Weight loss. EXAM: CT CHEST WITHOUT CONTRAST TECHNIQUE: Multidetector CT imaging of the chest was performed following the standard protocol without IV contrast. COMPARISON:  None FINDINGS: Cardiovascular: Normal heart size. No pericardial effusion. Aortic  atherosclerosis. Calcification scratch set 3 vessel coronary artery atherosclerotic calcifications. Mediastinum/Nodes: Normal appearance of the thyroid gland. The trachea appears patent and is midline. Normal appearance of the esophagus. Subcarinal lymph node is enlarged measuring 2.1 cm. Enlarged azygos-esophageal lymph node measures 2 cm, image 28/2. Right hilar lymph node is enlarged measuring 1.8 cm, image 82/2. Lungs/Pleura: Subpleural area of masslike architectural distortion within the posterolateral right lung base is identified measuring 4.9 x 5.0 cm, image 104/2. Nodule within the left lower lobe measures 2.4 cm, image 111/7. Upper Abdomen: Cirrhotic appearing liver is again noted. There has been interval placement of percutaneous transit [facility] biliary drainage catheter. Musculoskeletal: No chest wall mass or suspicious bone lesions identified. IMPRESSION: 1. Large mass in the posterolateral right lung base and left lower lobe pulmonary nodule. Suspicious for malignancy. Cannot rule out metastatic disease from suspected multifocal hepatoma. 2. Right hilar and mediastinal adenopathy. 3.  Aortic Atherosclerosis (ICD10-I70.0). 4. Multi vessel coronary artery atherosclerotic calcifications. Electronically Signed   By: Kerby Moors M.D.   On: 10/27/2017 11:58   Ct Abdomen Pelvis W Contrast  Result Date: 11/08/2017 CLINICAL DATA:  62 year old male with history of lung cancer presenting with generalized weakness and jaundice. EXAM: CT ABDOMEN AND PELVIS WITH CONTRAST TECHNIQUE: Multidetector CT imaging of the abdomen and pelvis was performed using the standard protocol following bolus administration of intravenous contrast. CONTRAST:  130m ISOVUE-300 IOPAMIDOL (ISOVUE-300) INJECTION 61% COMPARISON:  CT of the abdomen pelvis dated 11/06/2016 FINDINGS: Lower chest: Partially visualized small right pleural effusion with associated compressive atelectasis of the right lower lobe. A partially visualized 5.1 x  3.8 cm right lung base mass and a 1.8 x 2.3 cm left lung base nodule. There is coronary vascular calcification. Small pocket of pneumoperitoneum along the lateral aspect of the liver related to biliary drain catheter placement. There is a small ascites. Hepatobiliary: Morphologic changes of cirrhosis. Multiple hepatic hypodense lesions as seen previously most consistent with metastatic disease. Ill-defined 2.1 x 5.1 cm hypodense area in the left lobe of the liver as seen previously appears to communicate with the left portal vein and concerning for a tumor thrombus or a bland thrombus within the left portal vein. A primary biliary neoplasm such as cholangiocarcinoma or metastatic disease are not excluded. There is layering sludge or small stones within the gallbladder. A percutaneous biliary drain with pigtail tip noted in similar position. No significant interval change in the dilatation of the intrahepatic biliary trees compared to the most recent prior CT. Pancreas: Unremarkable. No pancreatic ductal dilatation or surrounding inflammatory changes. Spleen: Normal in size without focal abnormality. Adrenals/Urinary Tract: Bilateral adrenal masses most consistent with metastatic disease. There is no hydronephrosis on either side. Punctate nonobstructing left renal inferior pole calculus. The visualized ureters and urinary bladder appear unremarkable. Stomach/Bowel: Surgical material noted in the region of the rectum similar to prior CT. Postsurgical changes and suture in the descending colon. There is no bowel obstruction. Mild thickened appearance of the hepatic flexure, likely reactive to cirrhosis and ascites/hepatic colopathy. Colitis is less likely. Clinical correlation is recommended. The appendix is normal. Vascular/Lymphatic: Mild atherosclerotic calcification of the aorta. No portal venous gas. Retroperitoneal adenopathy. A 3.6 x 2.2 cm nodular density with surrounding there is no plastic reaction in the  left lower abdomen (series 2, image 65) most consistent with mesenteric or serosal metastatic disease/implant. Reproductive: The prostate and seminal vesicles are grossly unremarkable. Other: Mild diffuse subcutaneous edema. Musculoskeletal: Degenerative changes of the spine. Bilateral L5 pars defects with grade 1 L5-S1 anterolisthesis. No acute osseous pathology. IMPRESSION: 1. Overall no significant interval change since the CT of 11/06/2017. Stable positioning of the percutaneous biliary drain with similar degree of biliary ductal dilatation. 2. Cirrhosis with multiple hepatic metastatic disease. Infiltrative appearing lesion in the left lobe of the liver appears to extend into the left portal vein and may represent a tumor thrombus, or bland thrombus. A primary biliary neoplasm (cholangiocarcinoma, or metastatic disease are other considerations. 3. Retroperitoneal, mesenteric, and adrenal metastatic disease. 4. Hepatic colopathy versus less likely segmental colitis of the hepatic flexure. 5. Small right pleural effusion and bilateral lung base pulmonary masses/nodules as seen previously. 6. Tiny pockets of pneumoperitoneum lateral to the liver along the percutaneous drain, decreased since the prior CT. 7. Other findings as above. Electronically Signed   By: AAnner CreteM.D.   On: 11/08/2017 22:50   Ct Abdomen Pelvis W  Contrast  Result Date: 11/06/2017 CLINICAL DATA:  Infiltrative lesion within the liver. Suspicion of cholangiocarcinoma. Cirrhotic liver. EXAM: CT ABDOMEN AND PELVIS WITH CONTRAST TECHNIQUE: Multidetector CT imaging of the abdomen and pelvis was performed using the standard protocol following bolus administration of intravenous contrast. CONTRAST:  124m ISOVUE-300 IOPAMIDOL (ISOVUE-300) INJECTION 61% COMPARISON:  CT 10/27/2017, 02/24/2017, MRI 10/25/2017 FINDINGS: Lower chest: Partially loculated fluid at the RIGHT lung base is increased compared to prior. Basilar atelectasis. There is  masslike consolidation in the RIGHT lower lobe measuring 3.7 cm unchanged. In LEFT lower lobe there is nodular metastasis measuring 2.6 cm not significant changed. Hepatobiliary: Percutaneous drain extends the porta hepatis from a RIGHT flank approach. Small pneumothorax at the RIGHT lateral lung base (image 27/4) associated with the chest tube. Centrally within liver, this ill-defined mass in central LEFT hepatic lobe measuring approximately 5.1 by 5.1 cm. There is biliary obstruction of the LEFT hepatic lobe. There are 2 round discrete lesion in the RIGHT hepatic lobe. One the subcapsular anterior RIGHT hepatic lobe measuring 2.3 cm. One in the inferior RIGHT hepatic lobe measuring 1.8 cm Gallbladder is normal. Common bile duct normal. Liver has a nodular contour with enlargement of the caudate lobe Pancreas: Normal pancreatic parenchymal intensity. No ductal dilatation or inflammation. Spleen: Normal spleen. Adrenals/urinary tract: Adrenal glands demonstrate bilateral metastatic lesions not changed. Kidneys, ureters and bladder normal Stomach/Bowel: Stomach, small-bowel normal. There is anastomosis along the descending colon. No obstruction. Rectum normal. Vascular/Lymphatic: Abdominal aorta normal caliber. No retroperitoneal periportal adenopathy. Metastatic lymph node positioned between the IVC in the aorta measures 16 mm short axis not changed from prior. No pelvic lymphadenopathy. No inguinal adenopathy. Reproductive: Prostate normal Musculoskeletal: No aggressive osseous lesion. IMPRESSION: 1. Increase in RIGHT pleural effusion. Stable masslike consolidation at the RIGHT lung base representing neoplasm versus pneumonia. 2. Stable metastatic nodule at the RIGHT lung base. 3. Infiltrative mass in the central LEFT hepatic lobe and discrete lesions in the RIGHT hepatic lobe consistent with metastatic disease versus primary hepatic biliary carcinoma. Favor cholangiocarcinoma. 4. Percutaneous drainage catheter  extends the porta hepatis. Small trapped pneumothorax along the tract of the catheter at the inferolateral RIGHT lung base. 5. Stable adrenal metastasis. 6. Metastatic adenopathy in the retroperitoneum unchanged. 7. Morphologic changes in liver consistent with  cirrhosis. Electronically Signed   By: SSuzy BouchardM.D.   On: 11/06/2017 17:04   Ct Abdomen Pelvis W Contrast  Result Date: 10/27/2017 CLINICAL DATA:  Drain placed yesterday with little output. EXAM: CT ABDOMEN AND PELVIS WITH CONTRAST TECHNIQUE: Multidetector CT imaging of the abdomen and pelvis was performed using the standard protocol following bolus administration of intravenous contrast. CONTRAST:  1071mISOVUE-300 IOPAMIDOL (ISOVUE-300) INJECTION 61% COMPARISON:  chest CT earlier today.  Abdominal CT 02/24/2017 FINDINGS: Lower chest: Subcarinal adenopathy partially imaged on the highest image, measuring 2.2 cm in short axis diameter. Left lower lobe nodule again noted, 2.5 cm. Masslike airspace opacity noted posteriorly in the right lower lobe, stable. Trace right pleural effusion. Hepatobiliary: Cirrhotic changes within the liver. Percutaneous transhepatic biliary drain in place. The tip is in the distal common bile duct. There is mild intrahepatic biliary ductal dilatation, particularly in the left hepatic lobe which appear stable since earlier chest CT. Multifocal areas of masslike low-density noted within the liver concerning for multifocal hepatoma or metastases. Largest is noted centrally in the left hepatic lobe measuring 5.1 x 4.2 cm. 1.9 cm low-density area in the anterior right hepatic lobe on image 16. 2.5 cm  low-density lesion in the right inferior hepatic lobe. Stones or sludge noted within the gallbladder. Pancreas: No focal abnormality or ductal dilatation. Spleen: No focal abnormality. Splenomegaly. Craniocaudal length measures 17 cm. Adrenals/Urinary Tract: Bilateral adrenal nodules and masses compatible with metastases, the  largest in the right adrenal gland measuring 3.5 cm. No renal mass or hydronephrosis. Urinary bladder is unremarkable. Stomach/Bowel: Moderate to large stool burden in the colon with mild gaseous distention of the colon. Stomach and small bowel decompressed, unremarkable. Vascular/Lymphatic: Aorta is normal caliber. No retroperitoneal adenopathy. Aortocaval node has short axis diameter of 2.2 cm on image 45. Aortic calcifications. Reproductive: Mildly prominent prostate. Other: No free fluid or free air. Musculoskeletal: No acute bony abnormality. Degenerative disc and facet disease throughout the lumbar spine. IMPRESSION: Changes of cirrhosis. Multiple hepatic lesions are noted concerning for multifocal hepatoma or metastases. Biliary drain is in place with the tip in the distal common bile duct. There is mild intrahepatic biliary ductal dilatation. Bilateral adrenal nodules and masses, right larger than left compatible with metastases. Retroperitoneal adenopathy. Large stool burden with mild gaseous distention of the colon. Aortic atherosclerosis. Electronically Signed   By: Rolm Baptise M.D.   On: 10/27/2017 21:04   US Abdomen Limited  Result Date: 10/29/2017 CLINICAL DATA:  Prior imaging demonstrating cirrhosis, liver lesions, bilateral lung masses, bilateral adrenal masses and retroperitoneal and subcarinal lymphadenopathy. Imaging of the liver is performed by ultrasound in anticipation of possible ultrasound-guided liver lesion biopsy. EXAM: ULTRASOUND ABDOMEN LIMITED COMPARISON:  MRI of the abdomen on 10/25/2017 and CT of the abdomen on 10/27/2017 FINDINGS: Liver: Previous suggested lesions by both MRI and CT are occult by ultrasound and no discrete lesions are identified to allow accurate liver biopsy under ultrasound guidance. Cirrhosis of the liver again noted. There is dilatation of intrahepatic bile ducts within the left lobe of the liver. Right-sided bile ducts are decompressed after recent  placement of a biliary drainage catheter. Biliary sludge is noted in the gallbladder lumen. IMPRESSION: Liver lesions are occult by ultrasound and ultrasound-guided liver biopsy was not able to be performed. There remains dilatation of left lobe intrahepatic bile ducts. Electronically Signed   By: Aletta Edouard M.D.   On: 10/29/2017 13:45   Ct Biopsy  Result Date: 10/29/2017 CLINICAL DATA:  Bilateral adrenal masses, right greater than left, lung lesions, liver lesions and lymphadenopathy. Liver lesions were occult by ultrasound and decision has been made to now perform a CT-guided right adrenal mass biopsy. EXAM: CT GUIDED CORE BIOPSY OF RIGHT ADRENAL GLAND MASS ANESTHESIA/SEDATION: 2.0 mg IV Versed; 100 mcg IV Fentanyl Total Moderate Sedation Time:  13 minutes. The patient's level of consciousness and physiologic status were continuously monitored during the procedure by Radiology nursing. PROCEDURE: The procedure risks, benefits, and alternatives were explained to the patient. Questions regarding the procedure were encouraged and answered. The patient understands and consents to the procedure. A time-out was performed prior to initiating the procedure. CT was performed in a nearly decubitus position with the right side down. The right flank region was prepped with chlorhexidine in a sterile fashion, and a sterile drape was applied covering the operative field. A sterile gown and sterile gloves were used for the procedure. Local anesthesia was provided with 1% Lidocaine. Under CT guidance, a 17 gauge needle was advanced to the level of a right adrenal mass. Three separate coaxial 18 gauge core biopsy samples were obtained and submitted in formalin. The outer needle was removed and additional CT performed. COMPLICATIONS: None  FINDINGS: The right adrenal gland is enlarged by a mass measuring approximately 3.1 x 4.4 cm. Solid tissue was obtained from the lesion. IMPRESSION: CT-guided core biopsy performed of a  right adrenal mass. Electronically Signed   By: Aletta Edouard M.D.   On: 10/29/2017 15:50   Dg Sinus/fist Tube Chk-non Gi  Result Date: 11/06/2017 INDICATION: Internal external biliary drain, decreased output EXAM: FLUOROSCOPIC INJECTION OF THE RIGHT INTERNAL EXTERNAL BILIARY DRAIN MEDICATIONS: NONE. ANESTHESIA/SEDATION: NONE. COMPLICATIONS: None immediate. PROCEDURE: Under sterile conditions, the existing right internal external biliary drain was injected with fluoroscopic imaging performed. This demonstrates accumulation of contrast at the right liver edge. Drain has significantly retracted with the catheter tip positioned in the proximal CBD. Distal aspect of the biliary drain does not opacify. Therefore, the tube may also be occluded. There is little bile output from the catheter. IMPRESSION: Significant retraction of the internal external biliary drain now with the tip position in the proximal CBD. Contrast injection accumulates at the liver margin where exposed sideholes are likely extrahepatic. Biliary system is not opacified. PLAN: Schedule for internal external biliary exchange and reposition tomorrow. Electronically Signed   By: Jerilynn Mages.  Shick M.D.   On: 11/06/2017 13:49   Dg Chest Port 1 View  Result Date: 11/20/2017 CLINICAL DATA:  62 y/o M; metastatic right adrenal gland cancer. Weakness with onset yesterday and fall. EXAM: PORTABLE CHEST 1 VIEW COMPARISON:  11/08/2017 chest radiograph. FINDINGS: The heart size and mediastinal contours are within normal limits. Both lungs are clear. ACDF hardware partially visualized. No acute osseous abnormality is evident. IMPRESSION: No active disease. Electronically Signed   By: Kristine Garbe M.D.   On: 11/20/2017 16:00   Ir Int Lianne Cure Biliary Drain With Cholangiogram  Result Date: 10/26/2017 INDICATION: 62 year old male with a history of cirrhosis, presumed HCC, portal vein thrombosis, and jaundice with evidence of obstructed biliary system at  the liver hilum. EXAM: IMAGE GUIDED PERCUTANEOUS TRANSHEPATIC CHOLANGIOGRAM WITH INTERNAL/EXTERNAL BILIARY DRAINAGE MEDICATIONS: Ciprofloxacin 400 mg IV; The antibiotic was administered within an appropriate time frame prior to the initiation of the procedure. 4 MG IV ZOFRAN ANESTHESIA/SEDATION: Moderate (conscious) sedation was employed during this procedure. A total of Versed 4.0 mg and Fentanyl 100 mcg was administered intravenously. Moderate Sedation Time: 17 MINUTES minutes. The patient's level of consciousness and vital signs were monitored continuously by radiology nursing throughout the procedure under my direct supervision. FLUOROSCOPY TIME:  Fluoroscopy Time: 3 minutes 18 seconds (175 mGy). COMPLICATIONS: None PROCEDURE: The procedure, risks, benefits, and alternatives were explained to the patient and the patient's family. A complete informed consent was performed, with risk benefit analysis. Specific risks that were discussed for the procedure include bleeding, infection, biliary sepsis, IC use day, organ injury, need for further procedure, need for further surgery, long-term drain placement, cardiopulmonary collapse, death. Questions regarding the procedure were encouraged and answered. The patient understands and consents to the procedure. Patient is position in supine position on the fluoroscopy table, and the upper abdomen was prepped and draped in the usual sterile fashion. Maximum barrier sterile technique with sterile gowns and gloves were used for the procedure. A timeout was performed prior to the initiation of the procedure. Local anesthesia was provided with 1% lidocaine with epinephrine. Ultrasound survey of the left liver lobe was performed, with then ultrasound of the right liver lobe. 1% lidocaine was used for local anesthesia, with generous infiltration of the skin and subcutaneous tissues in and inter left costal location. Formal PTC puncture of the right  liver was performed from  intercostal approach. Once the tip of the needle was confirmed within the biliary system by observing for spontaneous bile return, and injecting small aliquots of contrast, images were stored of the biliary system after partially opacifying the biliary tree via the needle. A second access was required for access into the biliary system, from similar right-sided intercostal location. Once the tip of this second needle was confirmed within the biliary system, an 018 wire was advanced centrally. The needle was removed, a small incision was made with an 11 blade scalpel, and then a triaxial Accustick system was advanced into the biliary system. The metal stiffener and dilator were removed, we confirmed placement with contrast infusion. A coaxial Glidewire and 4 French glide cath were then used to navigate across the obstruction at the hilum of the liver. Once the catheter was presumed to be within the duodenum, the wire was removed and contrast confirmed location. A stiff Amplatz wire was advanced through the system, and the Accustick and Glidewire were removed. Dilation of the subcutaneous tissue tracks was performed with an 8 [place] dilator, and then a 10 [place] biliary drain was placed as an internal/external biliary drain. Small amount of contrast confirmed location. The patient remained hemodynamically stable throughout. Placement of the drain resulted in significant nausea. We elected to defer the left-sided drainage at this time. No complications were encountered and no significant blood loss was encountered. IMPRESSION: Status post formal PTC and internal/external biliary drainage. Signed, Dulcy Fanny. Dellia Nims, RPVI [facility] Radiology Electronically Signed   By: Corrie Mckusick D.O.   On: 10/26/2017 14:45   Ir Exchange Biliary Drain  Result Date: 11/08/2017 INDICATION: 62 year old male with a history of previous percutaneous placement of internal external  biliary drain. Drain has become withdrawn and he presents for replacement. EXAM: IMAGE GUIDED REPLACEMENT OF INTERNAL/EXTERNAL BILIARY DRAIN. MEDICATIONS: None ANESTHESIA/SEDATION: Moderate (conscious) sedation was employed during this procedure. A total of Versed 1.0 mg and Fentanyl 25 mcg was administered intravenously. Moderate Sedation Time: 10 minutes. The patient's level of consciousness and vital signs were monitored continuously by radiology nursing throughout the procedure under my direct supervision. FLUOROSCOPY TIME:  Fluoroscopy Time: 1 minutes 54 seconds (121 mGy). COMPLICATIONS: None PROCEDURE: Informed written consent was obtained from the patient after a thorough discussion of the procedural risks, benefits and alternatives. All questions were addressed. Maximal Sterile Barrier Technique was utilized including caps, mask, sterile gowns, sterile gloves, sterile drape, hand hygiene and skin antiseptic. A timeout was performed prior to the initiation of the procedure. Patient positioned supine position on the II table. Images were stored sent to PACs. Patient is prepped and draped in the usual sterile fashion including the indwelling drain. Gentle injection of contrast confirmed the location of the existing drain. Using modified Seldinger technique, the internal external biliary drain was exchanged for a new 10 [place] drain, with the loop curled within the duodenum. Final images were stored confirming flow antegrade. Catheter was sutured in position attached to gravity drainage. Patient tolerated the procedure well and remained hemodynamically stable throughout. No complications were encountered and no significant blood loss. IMPRESSION: Status post exchange of displaced internal/external biliary drain with placement of a new 10 French tube. Signed, Dulcy Fanny. Dellia Nims, RPVI [facility] Radiology Electronically Signed   By: Corrie Mckusick D.O.   On:  11/08/2017 08:30   Ir Radiologist Eval & Mgmt  Result Date: 11/06/2017 Please refer to notes  tab for details about interventional procedure. (Op Note)   Microbiology: Recent Results (from the past 240 hour(s))  Body fluid culture     Status: None   Collection Time: 11/20/17  9:07 AM  Result Value Ref Range Status   Specimen Description   Final    WOUND Performed at [facility], [address] [place], [place] [address]    Special Requests   Final    NONE Performed at [facility], [place] [address] [place], [place] [address]    Gram Stain   Final    NO WBC SEEN RARE GRAM NEGATIVE RODS Performed at [facility], [place] [address], [place], [place] [address]    Culture   Final    ABUNDANT KLEBSIELLA OXYTOCA ABUNDANT ENTEROBACTER CLOACAE    Report Status 11/23/2017 FINAL  Final   Organism ID, Bacteria KLEBSIELLA OXYTOCA  Final   Organism ID, Bacteria ENTEROBACTER CLOACAE  Final      Susceptibility   Enterobacter cloacae - MIC*    CEFAZOLIN >=64 RESISTANT Resistant     CEFEPIME <=1 SENSITIVE Sensitive     CEFTAZIDIME <=1 SENSITIVE Sensitive     CEFTRIAXONE <=1 SENSITIVE Sensitive     CIPROFLOXACIN <=0.25 SENSITIVE Sensitive     GENTAMICIN <=1 SENSITIVE Sensitive     IMIPENEM <=0.25 SENSITIVE Sensitive     TRIMETH/SULFA <=20 SENSITIVE Sensitive     PIP/TAZO <=4 SENSITIVE Sensitive     * ABUNDANT ENTEROBACTER CLOACAE   Klebsiella oxytoca - MIC*    AMPICILLIN RESISTANT Resistant     CEFAZOLIN <=4 SENSITIVE Sensitive     CEFEPIME <=1 SENSITIVE Sensitive     CEFTAZIDIME <=1 SENSITIVE Sensitive     CEFTRIAXONE <=1 SENSITIVE Sensitive     CIPROFLOXACIN <=0.25 SENSITIVE Sensitive     GENTAMICIN <=1 SENSITIVE Sensitive     IMIPENEM <=0.25 SENSITIVE Sensitive     TRIMETH/SULFA <=20 SENSITIVE Sensitive     AMPICILLIN/SULBACTAM 4 SENSITIVE Sensitive     PIP/TAZO <=4 SENSITIVE Sensitive     Extended ESBL NEGATIVE Sensitive      * ABUNDANT KLEBSIELLA OXYTOCA  Culture, Blood     Status: None (Preliminary result)   Collection Time: 11/20/17 10:04 AM  Result Value Ref Range Status   Specimen Description   Final    BLOOD LEFT HAND Performed at [facility], [place] [address] [place], [place] [address]    Special Requests   Final    BOTTLES DRAWN AEROBIC AND ANAEROBIC Blood Culture adequate volume   Culture   Final    NO GROWTH 4 DAYS Performed at [facility], [place] [address], [place], [place] [address]    Report Status PENDING  Incomplete  Culture, Blood     Status: None (Preliminary result)   Collection Time: 11/20/17 10:17 AM  Result Value Ref Range Status   Specimen Description   Final    BLOOD RIGHT HAND Performed at [facility], [place] [address] [place], [place] [address]    Special Requests   Final    BOTTLES DRAWN AEROBIC AND ANAEROBIC Blood Culture adequate volume   Culture   Final    NO GROWTH 4 DAYS Performed at [facility], [place] [address] [place], [place] [address]    Report Status PENDING  Incomplete  Anaerobic culture     Status: None (Preliminary result)   Collection Time: 11/20/17 10:53 AM  Result Value Ref Range  Status   Specimen Description   Final    BILE Performed at [facility], [place] [address] [place], [place] [address]    Special Requests   Final    NONE Performed at [facility], [place] [address], [place], [place] [address]    Gram Stain   Final    NO ANAEROBES ISOLATED; CULTURE IN PROGRESS FOR 5 DAYS Performed at [facility], [place] [address], [place], [place] [address]    Culture   Final    NO ANAEROBES ISOLATED; CULTURE IN PROGRESS FOR 5 DAYS   Report Status PENDING  Incomplete     Labs: Basic Metabolic Panel: Recent Labs  Lab 11/20/17 1500 11/20/17 2043 11/21/17 0545 11/22/17 0518 11/23/17 0535 11/24/17 0556  NA 125*  --  130* 131* 132* 132*   K 5.3*  --  5.3* 5.0 4.8 4.4  CL 99  --  107 104 105 105  CO2 17*  --  17* 22 19* 18*  GLUCOSE 83  --  98 98 124* 100*  BUN 61*  --  40* _0 CREATININE 1.36* 0.98 0.74 0.64 0.44* 0.66  CALCIUM 8.8*  --  8.8* 8.6* 8.4* 8.8*   Liver Function Tests: Recent Labs  Lab 11/20/17 0812 11/20/17 1500 11/21/17 0545  AST 41 40 36  ALT 32 32 29  ALKPHOS 638* 502* 428*  BILITOT 2.3* 1.9* 2.1*  PROT 9.1* 8.4* 7.6  ALBUMIN 2.5* 2.5* 2.3*   Recent Labs  Lab 11/20/17 1500  LIPASE 69*   Recent Labs  Lab 11/20/17 1637  AMMONIA 20   CBC: Recent Labs  Lab 11/20/17 0812 11/20/17 1500 11/20/17 2043 11/21/17 0545 11/22/17 0518 11/24/17 0556  WBC 10.1 10.3 8.8 7.1 5.0 6.2  NEUTROABS 7.1 7.2  --  5.6 3.2 3.8  HGB 11.2* 9.8* 10.4* 9.7* 9.3* 10.0*  HCT 33.7* 31.1* 33.8* 31.4* 30.0* 31.8*  MCV 88.9 95.7 97.1 95.7 94.9 96.4  PLT 584* 548* 336 312 293 247   Cardiac Enzymes: No results for input(s): CKTOTAL, CKMB, CKMBINDEX, TROPONINI in the last 168 hours. BNP: BNP (last 3 results) No results for input(s): BNP in the last 8760 hours.  ProBNP (last 3 results) No results for input(s): PROBNP in the last 8760 hours.  CBG: Recent Labs  Lab 11/23/17 1137 11/23/17 1624 11/23/17 2138 11/24/17 0805 11/24/17 1251  GLUCAP 119* 95 126* 131* 113*       Signed:  Alma Friendly, MD [facility] 11/24/2017, 1:29 PM

## 2017-11-25 ENCOUNTER — Telehealth: Payer: Self-pay | Admitting: Hematology

## 2017-11-25 ENCOUNTER — Telehealth: Payer: Self-pay | Admitting: *Deleted

## 2017-11-25 ENCOUNTER — Ambulatory Visit (HOSPITAL_COMMUNITY): Payer: Medicare Other

## 2017-11-25 LAB — ANAEROBIC CULTURE

## 2017-11-25 LAB — CULTURE, BLOOD (SINGLE)
Culture: NO GROWTH
Culture: NO GROWTH
SPECIAL REQUESTS: ADEQUATE
Special Requests: ADEQUATE

## 2017-11-25 LAB — GLUCOSE, CAPILLARY
Glucose-Capillary: 89 mg/dL (ref 70–99)
Glucose-Capillary: 137 mg/dL — ABNORMAL HIGH (ref 70–99)

## 2017-11-25 NOTE — Telephone Encounter (Signed)
Pt called informing [name] that he was discharged from the hospital, and was given antibiotics.  Wanted to know if he should continue with antibiotics and to see [name] soon.  Pt has appt for lab and chemo on  12/03/17. Spoke with pt and was informed that he was discharged from the hospital yesterday 11/24/17 for biliary drain infection.  Pt was prescribed Cephalexin 500 mg BID for 6 days. Instructed pt to continue with ABX as given until completion of the course.  [name] will be notified 11/26/17 as md is not in office today.  Instructed pt to increase po fluids intake as much as tolerated.  Pt understood that he will be contacted with further instructions tomorrow.

## 2017-11-25 NOTE — Telephone Encounter (Signed)
I will schedule him to see me on Monday 11/25.   [name] MD

## 2017-11-25 NOTE — Telephone Encounter (Signed)
Scheduled appt per 11/19 sch message - left message for patient with appt date and time.

## 2017-11-29 NOTE — Progress Notes (Addendum)
Matthew Lewis  [phone] [phone]  Clinic Follow up Note   Patient Care Team: [name], MD as PCP - General (Internal Medicine) 12/01/2017  Chief Complaint: F/u on metastatic cholangiocarcinoma  SUMMARY OF ONCOLOGIC HISTORY: Oncology History   Cancer Staging Intrahepatic cholangiocarcinoma ([place]) Staging form: Intrahepatic Bile Duct, AJCC 8th Edition - Clinical stage from 10/29/2017: Stage IV (cT2, cN1, pM1) - Signed by [name], MD on 11/06/2017       Malignant neoplasm metastatic to adrenal gland [facility])    Initial Diagnosis    Malignant neoplasm metastatic to adrenal gland ([place])    10/23/2017 Imaging    ABD US IMPRESSION: Cirrhotic appearance of the liver.  2.6 cm questionable ill-defined mass versus area of focal parenchymal heterogeneity in segment 6 of the liver. If further imaging evaluation is desired, liver protocol abdominal MRI should be considered.  Splenomegaly.  Apparent thickening of the gallbladder wall, without evidence of cholelithiasis, and with negative sonographic Murphy's sign. The findings are favored to represent gallbladder wall edema secondary to systemic causes such as hyperproteinemia rather than acute cholecystitis. Please correlate clinically.     10/25/2017 Imaging    MRI IMPRESSION: 1. Advanced changes of cirrhosis. Interval development of multifocal enhancing liver lesions, which is concerning for multicentric hepatocellular carcinoma involving both lobes of liver. 2. Lesion within segment 4 B obstructs the right medial hepatic duct and left main duct resulting in moderate to marked biliary ductal dilatation within the left lobe and dome of liver. There may be partial obstruction of the right lateral duct with mild dilatation. 3. Tumor thrombus within the left branch of the portal vein likely secondary to segment 4 B tumor. 4. Bilateral adrenal metastasis and retroperitoneal  nodal metastasis. 5. Stigmata of portal venous hypertension including varices, ascites and splenomegaly. 6. These results will be called to the ordering clinician or representative by the Radiologist Assistant, and communication documented in the PACS or zVision Dashboard.    10/26/2017 Procedure    IMPRESSION: Status post formal PTC and internal/external biliary drainage.    10/27/2017 Imaging    IMPRESSION: Changes of cirrhosis. Multiple hepatic lesions are noted concerning for multifocal hepatoma or metastases. Biliary drain is in place with the tip in the distal common bile duct. There is mild intrahepatic biliary ductal dilatation. Bilateral adrenal nodules and masses, right larger than left compatible with metastases. Retroperitoneal adenopathy. Large stool burden with mild gaseous distention of the colon. Aortic atherosclerosis.     10/29/2017 Pathology Results    Diagnosis Soft Tissue Needle Core Biopsy, right adrenal mass - METASTATIC ADENOCARCINOMA, SEE COMMENT. Microscopic Comment There are malignant glands with central necrosis. Immunohistochemistry is positive for cytokeratin 20 and CDX-2. Cytokeratin 7, TTF-1 and NapsinA are negative. The morphology and immunoprofile favor a colorectal primary over pancreaticobiliary. [name] has reviewed the case. [name] was notified on 10/31/2017.    11/04/2017 Procedure    Colonoscopy impression - Seton in place found on digital rectal exam. - Congested mucosa in the rectum, in the sigmoid colon and in the descending colon. - A few diminutive polyps in the rectum and in the sigmoid colon. No colon or rectal mass identified. - No specimens collected.     Intrahepatic cholangiocarcinoma ([place])   10/27/2017 Imaging    10/27/2017 CT AP IMPRESSION: Changes of cirrhosis. Multiple hepatic lesions are noted concerning for multifocal hepatoma or metastases.  Biliary drain is in place with the tip in the distal common  bile duct. There is  mild intrahepatic biliary ductal dilatation.  Bilateral adrenal nodules and masses, right larger than left compatible with metastases.  Retroperitoneal adenopathy.  Large stool burden with mild gaseous distention of the colon.  Aortic atherosclerosis.    10/29/2017 Cancer Staging    Staging form: Intrahepatic Bile Duct, AJCC 8th Edition - Clinical stage from 10/29/2017: Stage IV (cT2, cN1, pM1) - Signed by [name], MD on 11/06/2017    10/29/2017 Pathology Results    10/29/2017 Surgical Pathology Diagnosis Soft Tissue Needle Core Biopsy, right adrenal mass - METASTATIC ADENOCARCINOMA, SEE COMMENT.    11/06/2017 Initial Diagnosis    Intrahepatic cholangiocarcinoma ([place])    11/06/2017 Imaging    11/06/2017 CT AP IMPRESSION: 1. Increase in RIGHT pleural effusion. Stable masslike consolidation at the RIGHT lung base representing neoplasm versus pneumonia. 2. Stable metastatic nodule at the RIGHT lung base. 3. Infiltrative mass in the central LEFT hepatic lobe and discrete lesions in the RIGHT hepatic lobe consistent with metastatic disease versus primary hepatic biliary carcinoma. Favor cholangiocarcinoma. 4. Percutaneous drainage catheter extends the porta hepatis. Small trapped pneumothorax along the tract of the catheter at the inferolateral RIGHT lung base. 5. Stable adrenal metastasis. 6. Metastatic adenopathy in the retroperitoneum unchanged. 7. Morphologic changes in liver consistent with  cirrhosis.    11/08/2017 Imaging    11/08/2017 CT AP IMPRESSION: 1. Overall no significant interval change since the CT of 11/06/2017. Stable positioning of the percutaneous biliary drain with similar degree of biliary ductal dilatation. 2. Cirrhosis with multiple hepatic metastatic disease. Infiltrative appearing lesion in the left lobe of the liver appears to extend into the left portal vein and may represent a tumor thrombus, or bland thrombus. A  primary biliary neoplasm (cholangiocarcinoma, or metastatic disease are other considerations. 3. Retroperitoneal, mesenteric, and adrenal metastatic disease. 4. Hepatic colopathy versus less likely segmental colitis of the hepatic flexure. 5. Small right pleural effusion and bilateral lung base pulmonary masses/nodules as seen previously. 6. Tiny pockets of pneumoperitoneum lateral to the liver along the percutaneous drain, decreased since the prior CT. 7. Other findings as above.     Chemotherapy    Plan to start first line chemo gemcitabine/cisplatin on days 1 and 15 every 28 days    11/20/2017 - 11/24/2017 Hospital Admission    Admit date: 11/08/2017 Admission diagnosis: fatigue Additional comments: discharged 11/24/2017    CURRENT THERAPY  Pending Chemotherapy  INTERVAL HISTORY: Matthew Lewis is a 62 y.o. male who is here for follow-up. He was hospitalized on 11/20/2017 for fatigue and dehydration. I saw him during the hospitalization. He was discharged on 11/24/2017.  Today, he is here with his wife. He is doing well and eating well, but still losing weight. He tries to eat a balanced diet of vegetables and meat. He is also trying to stay hydrated and drink enough fluids. He still has a draining tube attached and he empties it almost 10 times a day. He denies fever.    Pertinent positives and negatives of review of systems are listed and detailed within the above HPI.   REVIEW OF SYSTEMS:   Constitutional: Denies fevers, chills (+) weight loss Eyes: Denies blurriness of vision Ears, nose, mouth, throat, and face: Denies mucositis or sore throat Respiratory: Denies cough, dyspnea or wheezes Cardiovascular: Denies palpitation, chest discomfort or lower extremity swelling Gastrointestinal:  Denies nausea, heartburn or change in bowel habits (+) drainage tube Skin: Denies abnormal skin rashes Lymphatics: Denies new lymphadenopathy or easy bruising Neurological:Denies  numbness, tingling or new  weaknesses Behavioral/Psych: Mood is stable, no new changes  All other systems were reviewed with the patient and are negative.  MEDICAL HISTORY:  Past Medical History:  Diagnosis Date  . Abscess of anal and rectal regions    horse shoe abscess  . Alcoholic cirrhosis ([place])   . Arthritis    "everywhere; especially in my spine"  . Cirrhosis ([place])    STAGE 1 CIRRHOSIS PATIENT SEES DR NAMDIGAM FOR  . Colon cancer ([place]) 05/2005   TX SURGERY WITH LYMPH NODE REMOVAL  . Cough    LAST FEW WEEKS SAW DR RAMOS LUNGS MILKY SPUTUM OCC  . DM type 2 with diabetic peripheral neuropathy ([facility])    left foot  . Fatigue   . Fistula, anal    multiple  . History of substance abuse ([place])    last alcohol was 08/2011; last marijuania was 08/2011  . Hypertension   . PTSD (post-traumatic stress disorder)    "severe" HX OF  . Sleep deprivation     SURGICAL HISTORY: Past Surgical History:  Procedure Laterality Date  . ABSCESS DRAINAGE     "probably 15-20 so far at least; rectal"  . ANAL FISTULECTOMY  12/13/1998  . ANTERIOR FUSION CERVICAL SPINE  2010   "triple"; "did 2 surgeries on the same day in 7616 due to complications"  . CORONARY ANGIOPLASTY  2001  . detached muscle  2010   right chest; "after cervical fusion complications"  . ELBOW SURGERY  2007   "cut out part of a muscle"; left  . EXAMINATION UNDER ANESTHESIA  03/22/2005   fistula  . EXAMINATION UNDER ANESTHESIA  06/06/2011   Procedure: EXAM UNDER ANESTHESIA;  Surgeon: Stark Klein, MD;  Location: [place];  Service: General;  Laterality: N/A;  . HEMICOLECTOMY  06/06/2005   left  . INCISION AND DRAINAGE PERIRECTAL ABSCESS  03/30/2005  . INCISION AND DRAINAGE PERIRECTAL ABSCESS  12/13/2004  . INCISION AND DRAINAGE PERIRECTAL ABSCESS  07/14/2001  . INCISION AND DRAINAGE PERIRECTAL ABSCESS  08/12/2010   horseshoe abscess; Dr Redmond Pulling  . INCISION AND DRAINAGE PERIRECTAL ABSCESS  06/06/2011   Procedure: IRRIGATION AND DEBRIDEMENT  PERIRECTAL ABSCESS;  Surgeon: Stark Klein, MD;  Location: [place];  Service: General;  Laterality: N/A;  . INCISION AND DRAINAGE PERIRECTAL ABSCESS N/A 02/24/2017   Procedure: IRRIGATION AND DEBRIDEMENT PERIRECTAL ABSCESS;  Surgeon: Ralene Ok, MD;  Location: [place];  Service: General;  Laterality: N/A;  . IR EXCHANGE BILIARY DRAIN  11/07/2017  . IR INT EXT BILIARY DRAIN WITH CHOLANGIOGRAM  10/26/2017  . IR RADIOLOGIST EVAL & MGMT  11/06/2017  . [place]  . PERCUTANEOUS PINNING PHALANX FRACTURE OF HAND  ~ 2008   "plates in 2 places"; right  . PLACEMENT OF SETON N/A 09/05/2017   Procedure: PLACEMENT OF SETON;  Surgeon: Leighton Ruff, MD;  Location[facility];  Service: General;  Laterality: N/A;  . RADIOLOGY WITH ANESTHESIA N/A 10/25/2017   Procedure: MRI WITH ANESTHESIA;  Surgeon: Radiologist, Medication, MD;  Location: [place];  Service: Radiology;  Laterality: N/A;  . THORACIC DISCECTOMY  1990's    I have reviewed the social history and family history with the patient and they are unchanged from previous note.  ALLERGIES:  is allergic to morphine and related and other.  MEDICATIONS:  Current Outpatient Medications  Medication Sig Dispense Refill  . aspirin EC 81 MG tablet Take 81 mg by mouth daily.    Marland Kitchen atorvastatin (LIPITOR) 40 MG tablet Take  1 tablet (40 mg total) by mouth daily at 6 PM. (Patient taking differently: Take 20 mg by mouth daily at 6 PM. ) 30 tablet 0  . calcium carbonate (TUMS - DOSED IN MG ELEMENTAL CALCIUM) 500 MG chewable tablet Chew 1 tablet by mouth 2 (two) times daily as needed for indigestion or heartburn.    . celecoxib (CELEBREX) 200 MG capsule Take 1 capsule by mouth at bedtime.   5  . empagliflozin (JARDIANCE) 10 MG TABS tablet Take 10 mg by mouth at bedtime.     Marland Kitchen Fexofenadine-Pseudoephedrine (ALLEGRA-D 24 HOUR PO) Take 1 tablet by mouth every evening. WAL/FEX     . gabapentin (NEURONTIN) 400 MG capsule Take 400-800 mg by  mouth 2 (two) times daily. 1 capsule am, 2 capsules pm    . glipiZIDE (GLUCOTROL) 5 MG tablet Take 5 mg by mouth 2 (two) times daily before a meal.    . Insulin Detemir (LEVEMIR FLEXPEN) 100 UNIT/ML Pen Inject 10-60 Units into the skin 2 (two) times daily. Take 62 units in the am and Take 10 units in the pm    . lisinopril (PRINIVIL,ZESTRIL) 20 MG tablet Take 10 mg by mouth daily.     . metFORMIN (GLUCOPHAGE) 1000 MG tablet Take 1,000 mg by mouth 2 (two) times daily with a meal.    . Multiple Vitamin (MULTIVITAMIN) tablet Take 1 tablet by mouth daily.    . ondansetron (ZOFRAN) 8 MG tablet Take 1 tablet (8 mg total) by mouth 2 (two) times daily as needed. Start on the third day after chemotherapy. 30 tablet 1  . oxyCODONE 10 MG TABS Take 1 tablet (10 mg total) by mouth every 4 (four) hours as needed for severe pain. 10 tablet 0  . polyvinyl alcohol (LIQUIFILM TEARS) 1.4 % ophthalmic solution Place 1 drop into both eyes daily as needed for dry eyes.     Marland Kitchen prochlorperazine (COMPAZINE) 10 MG tablet Take 1 tablet (10 mg total) by mouth every 6 (six) hours as needed (Nausea or vomiting). 30 tablet 1   No current facility-administered medications for this visit.     PHYSICAL EXAMINATION: ECOG PERFORMANCE STATUS: 2 - Symptomatic, <50% confined to bed  Vitals:   12/01/17 1557  BP: 108/78  Pulse: (!) 111  Resp: 17  Temp: 97.8 F (36.6 C)  SpO2: 100%   Filed Weights   12/01/17 1557  Weight: 198 lb 3.2 oz (89.9 kg)    GENERAL:alert, no distress and comfortable SKIN: skin texture, turgor are normal, no rashes or significant lesions (+) jaundice EYES: normal, Conjunctiva are pink and non-injected, sclera clear OROPHARYNX:no exudate, no erythema and lips, buccal mucosa, and tongue normal  NECK: supple, thyroid normal size, non-tender, without nodularity LYMPH:  no palpable lymphadenopathy in the cervical, axillary or inguinal LUNGS: clear to auscultation and percussion with normal breathing  effort HEART: regular rate & rhythm and no murmurs and no lower extremity edema ABDOMEN:abdomen soft, non-tender and normal bowel sounds (+) drainage tube inserted Musculoskeletal:no cyanosis of digits and no clubbing  NEURO: alert & oriented x 3 with fluent speech, no focal motor/sensory deficits  LABORATORY DATA:  I have reviewed the data as listed CBC Latest Ref Rng & Units 12/01/2017 11/24/2017 11/22/2017  WBC 4.0 - 10.5 K/uL 12.3(H) 6.2 5.0  Hemoglobin 13.0 - 17.0 g/dL 12.4(L) 10.0(L) 9.3(L)  Hematocrit 39.0 - 52.0 % 39.5 31.8(L) 30.0(L)  Platelets 150 - 400 K/uL 326 247 293     CMP Latest Ref Rng &  Units 12/01/2017 11/24/2017 11/23/2017  Glucose 70 - 99 mg/dL 126(H) 100(H) 124(H)  BUN 8 - 23 mg/dL _0 Creatinine 0.61 - 1.24 mg/dL 0.87 0.66 0.44(L)  Sodium 135 - 145 mmol/L 133(L) 132(L) 132(L)  Potassium 3.5 - 5.1 mmol/L 4.8 4.4 4.8  Chloride 98 - 111 mmol/L 103 105 105  CO2 22 - 32 mmol/L 20(L) 18(L) 19(L)  Calcium 8.9 - 10.3 mg/dL 10.3 8.8(L) 8.4(L)  Total Protein 6.5 - 8.1 g/dL 9.7(H) - -  Total Bilirubin 0.3 - 1.2 mg/dL 1.8(H) - -  Alkaline Phos 38 - 126 U/L 622(H) - -  AST 15 - 41 U/L 54(H) - -  ALT 0 - 44 U/L 46(H) - -    RADIOGRAPHIC STUDIES: I have personally reviewed the radiological images as listed and agreed with the findings in the report. No results found.   ASSESSMENT & PLAN:  JAXX HUISH is a 62 y.o. male with history of colon cancer in 2007,  liver cirrhosis secondary to alcohol and [name], presented with jaundice and abdominal pain.  1.metastatic adenocarcinoma to right adrenal gland, with multiple liver and lung masses, retroperitoneal adenopathy, likely metastatic cholangiocarcinoma -His initial CT and MRI showed multiple liver, and adrenal masses, adrenal mass biopsy confirmed metastatic adenocarcinoma, with the morphology favor colorectal primary, but colonoscopy was negative, and his previous colon cancer was 12 years ago. His case was  discussed in multiple tumor board, and we felt this is likely metastatic cholangiocarcinoma, also metastatic colon cancer is also a possibility.  -I previously recommended palliative systemic with cisplatin and gemcitabine. He is unfortunately performing poorly and may not be a candidate for chemotherapy.   -He has had multiple hospital admission for infection, and dehydration. -He has recovered well from his recent hospitalization, with good appetite and energy level, looking much better overall  -Labs reviewed, CBC showed Hg 12.4 WBC 12.3 Neutro Abs 9.3. CMP showed Na 133 BG 126  AST 54 ALT 46 ALK 622. -We again discussed Perative chemotherapy, versus palliative care alone.  Patient strongly prefer to try chemo. I recommend first line cisplatin and gemcitabine  --Chemotherapy consent: Side effects including but does not not limited to, fatigue, nausea, vomiting, diarrhea, hair loss, neuropathy, hear loss, fluid retention, renal and kidney dysfunction, neutropenic fever, needed for blood transfusion, bleeding, were discussed with patient in great detail. He agrees to proceed. -the goal of chemo is palliative, to prolong his life and improve his quality of life. -I will start chemo in 2 days and f/u next week -He has not attended chemo class, will be scheduled for tomorrow  -I will see him back next week   -Plan to have port placement by IR next week  2.Obstructive jaundice secondary to #1 -Labs reviewed, CBC showed Hg 12.4 WBC 12.3 Neutro Abs 9.3. CMP showed Na 133 BG 126  AST 54 ALT 46 ALK 622. -much improved after stent placement   3.  Moderate protein and calorie malnutrition -Follow-up with dietitian -I again encouraged him to take nutritional supplement  4.Liver cirrhosis secondary to alcohol and NASH -Liver function overall stable  5. Portal vein thrombosis -He is on aspirin, no anticoagulation due to bloody biliary drainage    6. DM, HTN -He is on  empagliflozin, glipizide,  insulin detemir, and metformin  -He is also on lisinopril for HTN -f/u with PCP -We will monitor his sugar and blood pressure closely during the chemotherapy, we discussed the possibility of hyperglycemia and hypotension after chemo, and  we may need to adjust his medication.  7. H/o substance abuse and addiction  - He's been clean for 6 years now - I previously referred to SW -will be cautious about narcotics and benzo.  8.  Goal of care discussion  -We again discussed the incurable nature of his cancer, and the overall poor prognosis, especially if he does not have good response to chemotherapy or progress on chemo -The patient understands the goal of care is palliative. -We discussed code status in length. I recommend DNR/DNI, he agrees.     Plan - Plan to start chemo cisplatin and gemcitabine in 2 days -f/u next week -I will schedule for chemo class for tomorrow  -port placement by IR next week   No problem-specific Assessment & Plan notes found for this encounter.   No orders of the defined types were placed in this encounter.  All questions were answered. The patient knows to call the clinic with any problems, questions or concerns. No barriers to learning was detected.  I spent 30 minutes counseling the patient face to face. The total time spent in the appointment was 40 minutes and more than 50% was on counseling and review of test results  I, Noor Dweik am acting as scribe for Dr. [name].  I have reviewed the above documentation for accuracy and completeness, and I agree with the above.      [name], MD 12/01/2017

## 2017-12-01 ENCOUNTER — Inpatient Hospital Stay: Payer: Medicare Other

## 2017-12-01 ENCOUNTER — Other Ambulatory Visit: Payer: Medicare Other

## 2017-12-01 ENCOUNTER — Inpatient Hospital Stay (HOSPITAL_BASED_OUTPATIENT_CLINIC_OR_DEPARTMENT_OTHER): Payer: Medicare Other | Admitting: Hematology

## 2017-12-01 VITALS — BP 108/78 | HR 111 | Temp 97.8°F | Resp 17 | Ht 72.0 in | Wt 198.2 lb

## 2017-12-01 DIAGNOSIS — Z79899 Other long term (current) drug therapy: Secondary | ICD-10-CM

## 2017-12-01 DIAGNOSIS — C7972 Secondary malignant neoplasm of left adrenal gland: Secondary | ICD-10-CM | POA: Diagnosis not present

## 2017-12-01 DIAGNOSIS — E440 Moderate protein-calorie malnutrition: Secondary | ICD-10-CM | POA: Insufficient documentation

## 2017-12-01 DIAGNOSIS — C772 Secondary and unspecified malignant neoplasm of intra-abdominal lymph nodes: Secondary | ICD-10-CM | POA: Diagnosis not present

## 2017-12-01 DIAGNOSIS — T8579XA Infection and inflammatory reaction due to other internal prosthetic devices, implants and grafts, initial encounter: Secondary | ICD-10-CM | POA: Diagnosis not present

## 2017-12-01 DIAGNOSIS — C7971 Secondary malignant neoplasm of right adrenal gland: Secondary | ICD-10-CM

## 2017-12-01 DIAGNOSIS — Z87891 Personal history of nicotine dependence: Secondary | ICD-10-CM | POA: Insufficient documentation

## 2017-12-01 DIAGNOSIS — K831 Obstruction of bile duct: Secondary | ICD-10-CM

## 2017-12-01 DIAGNOSIS — I2510 Atherosclerotic heart disease of native coronary artery without angina pectoris: Secondary | ICD-10-CM | POA: Diagnosis not present

## 2017-12-01 DIAGNOSIS — K8309 Other cholangitis: Secondary | ICD-10-CM | POA: Diagnosis not present

## 2017-12-01 DIAGNOSIS — A419 Sepsis, unspecified organism: Secondary | ICD-10-CM | POA: Insufficient documentation

## 2017-12-01 DIAGNOSIS — E1140 Type 2 diabetes mellitus with diabetic neuropathy, unspecified: Secondary | ICD-10-CM

## 2017-12-01 DIAGNOSIS — Z1612 Extended spectrum beta lactamase (ESBL) resistance: Secondary | ICD-10-CM | POA: Diagnosis not present

## 2017-12-01 DIAGNOSIS — E872 Acidosis: Secondary | ICD-10-CM | POA: Diagnosis not present

## 2017-12-01 DIAGNOSIS — C7802 Secondary malignant neoplasm of left lung: Secondary | ICD-10-CM

## 2017-12-01 DIAGNOSIS — I10 Essential (primary) hypertension: Secondary | ICD-10-CM | POA: Diagnosis not present

## 2017-12-01 DIAGNOSIS — C221 Intrahepatic bile duct carcinoma: Secondary | ICD-10-CM | POA: Insufficient documentation

## 2017-12-01 DIAGNOSIS — I81 Portal vein thrombosis: Secondary | ICD-10-CM

## 2017-12-01 DIAGNOSIS — N179 Acute kidney failure, unspecified: Secondary | ICD-10-CM | POA: Diagnosis not present

## 2017-12-01 DIAGNOSIS — E11649 Type 2 diabetes mellitus with hypoglycemia without coma: Secondary | ICD-10-CM | POA: Diagnosis not present

## 2017-12-01 DIAGNOSIS — Z794 Long term (current) use of insulin: Secondary | ICD-10-CM | POA: Diagnosis not present

## 2017-12-01 DIAGNOSIS — A4150 Gram-negative sepsis, unspecified: Secondary | ICD-10-CM | POA: Diagnosis not present

## 2017-12-01 DIAGNOSIS — D649 Anemia, unspecified: Secondary | ICD-10-CM | POA: Diagnosis not present

## 2017-12-01 DIAGNOSIS — E860 Dehydration: Secondary | ICD-10-CM

## 2017-12-01 DIAGNOSIS — C786 Secondary malignant neoplasm of retroperitoneum and peritoneum: Secondary | ICD-10-CM

## 2017-12-01 DIAGNOSIS — R7881 Bacteremia: Secondary | ICD-10-CM | POA: Insufficient documentation

## 2017-12-01 DIAGNOSIS — K7030 Alcoholic cirrhosis of liver without ascites: Secondary | ICD-10-CM

## 2017-12-01 DIAGNOSIS — R6521 Severe sepsis with septic shock: Secondary | ICD-10-CM | POA: Diagnosis not present

## 2017-12-01 DIAGNOSIS — F4310 Post-traumatic stress disorder, unspecified: Secondary | ICD-10-CM

## 2017-12-01 DIAGNOSIS — C787 Secondary malignant neoplasm of liver and intrahepatic bile duct: Secondary | ICD-10-CM | POA: Insufficient documentation

## 2017-12-01 DIAGNOSIS — C779 Secondary and unspecified malignant neoplasm of lymph node, unspecified: Secondary | ICD-10-CM

## 2017-12-01 DIAGNOSIS — R112 Nausea with vomiting, unspecified: Secondary | ICD-10-CM | POA: Diagnosis not present

## 2017-12-01 DIAGNOSIS — I959 Hypotension, unspecified: Secondary | ICD-10-CM

## 2017-12-01 DIAGNOSIS — E119 Type 2 diabetes mellitus without complications: Secondary | ICD-10-CM | POA: Diagnosis not present

## 2017-12-01 DIAGNOSIS — E861 Hypovolemia: Secondary | ICD-10-CM | POA: Diagnosis not present

## 2017-12-01 DIAGNOSIS — E1142 Type 2 diabetes mellitus with diabetic polyneuropathy: Secondary | ICD-10-CM | POA: Insufficient documentation

## 2017-12-01 DIAGNOSIS — R1310 Dysphagia, unspecified: Secondary | ICD-10-CM | POA: Diagnosis not present

## 2017-12-01 DIAGNOSIS — R918 Other nonspecific abnormal finding of lung field: Secondary | ICD-10-CM | POA: Diagnosis not present

## 2017-12-01 DIAGNOSIS — C7801 Secondary malignant neoplasm of right lung: Secondary | ICD-10-CM

## 2017-12-01 DIAGNOSIS — B9620 Unspecified Escherichia coli [E. coli] as the cause of diseases classified elsewhere: Secondary | ICD-10-CM | POA: Diagnosis not present

## 2017-12-01 DIAGNOSIS — E871 Hypo-osmolality and hyponatremia: Secondary | ICD-10-CM | POA: Diagnosis not present

## 2017-12-01 LAB — CBC WITH DIFFERENTIAL (CANCER CENTER ONLY)
Abs Immature Granulocytes: 0.05 10*3/uL (ref 0.00–0.07)
BASOS ABS: 0.1 10*3/uL (ref 0.0–0.1)
Basophils Relative: 0 %
EOS ABS: 0.7 10*3/uL — AB (ref 0.0–0.5)
Eosinophils Relative: 6 %
HEMATOCRIT: 39.5 % (ref 39.0–52.0)
HEMOGLOBIN: 12.4 g/dL — AB (ref 13.0–17.0)
IMMATURE GRANULOCYTES: 0 %
LYMPHS ABS: 1.3 10*3/uL (ref 0.7–4.0)
LYMPHS PCT: 11 %
MCH: 29 pg (ref 26.0–34.0)
MCHC: 31.4 g/dL (ref 30.0–36.0)
MCV: 92.5 fL (ref 80.0–100.0)
Monocytes Absolute: 1 10*3/uL (ref 0.1–1.0)
Monocytes Relative: 8 %
NEUTROS PCT: 75 %
NRBC: 0 % (ref 0.0–0.2)
Neutro Abs: 9.3 10*3/uL — ABNORMAL HIGH (ref 1.7–7.7)
Platelet Count: 326 10*3/uL (ref 150–400)
RBC: 4.27 MIL/uL (ref 4.22–5.81)
RDW: 16.1 % — AB (ref 11.5–15.5)
WBC Count: 12.3 10*3/uL — ABNORMAL HIGH (ref 4.0–10.5)

## 2017-12-01 LAB — CMP (CANCER CENTER ONLY)
ALT: 46 U/L — ABNORMAL HIGH (ref 0–44)
ANION GAP: 10 (ref 5–15)
AST: 54 U/L — ABNORMAL HIGH (ref 15–41)
Albumin: 3.4 g/dL — ABNORMAL LOW (ref 3.5–5.0)
Alkaline Phosphatase: 622 U/L — ABNORMAL HIGH (ref 38–126)
BUN: 16 mg/dL (ref 8–23)
CHLORIDE: 103 mmol/L (ref 98–111)
CO2: 20 mmol/L — AB (ref 22–32)
Calcium: 10.3 mg/dL (ref 8.9–10.3)
Creatinine: 0.87 mg/dL (ref 0.61–1.24)
GFR, Estimated: >60 mL/min (ref >60)
Glucose, Bld: 126 mg/dL — ABNORMAL HIGH (ref 70–99)
Potassium: 4.8 mmol/L (ref 3.5–5.1)
SODIUM: 133 mmol/L — AB (ref 135–145)
Total Bilirubin: 1.8 mg/dL — ABNORMAL HIGH (ref 0.3–1.2)
Total Protein: 9.7 g/dL — ABNORMAL HIGH (ref 6.5–8.1)

## 2017-12-01 MED FILL — NORMAL SALINE FLUSH SYRINGE: 0.9 | 30 days supply | Qty: 300 | Fill #0

## 2017-12-02 ENCOUNTER — Encounter: Payer: Self-pay | Admitting: Radiology

## 2017-12-02 ENCOUNTER — Inpatient Hospital Stay: Payer: Medicare Other

## 2017-12-02 ENCOUNTER — Inpatient Hospital Stay (HOSPITAL_BASED_OUTPATIENT_CLINIC_OR_DEPARTMENT_OTHER): Payer: Medicare Other | Admitting: Medical

## 2017-12-02 ENCOUNTER — Other Ambulatory Visit (HOSPITAL_COMMUNITY): Payer: Self-pay | Admitting: Radiology

## 2017-12-02 ENCOUNTER — Telehealth: Payer: Self-pay

## 2017-12-02 ENCOUNTER — Ambulatory Visit (HOSPITAL_COMMUNITY)
Admission: RE | Admit: 2017-12-02 | Discharge: 2017-12-02 | Disposition: A | Discharge status: Home / Self Care | Payer: Medicare Other | Source: Ambulatory Visit | Attending: Radiology | Admitting: Radiology

## 2017-12-02 VITALS — BP 102/69 | HR 128 | Temp 98.8°F | Resp 24

## 2017-12-02 DIAGNOSIS — K831 Obstruction of bile duct: Secondary | ICD-10-CM

## 2017-12-02 DIAGNOSIS — K7030 Alcoholic cirrhosis of liver without ascites: Secondary | ICD-10-CM | POA: Diagnosis not present

## 2017-12-02 DIAGNOSIS — R6889 Other general symptoms and signs: Secondary | ICD-10-CM

## 2017-12-02 DIAGNOSIS — R112 Nausea with vomiting, unspecified: Secondary | ICD-10-CM | POA: Diagnosis not present

## 2017-12-02 DIAGNOSIS — C221 Intrahepatic bile duct carcinoma: Secondary | ICD-10-CM | POA: Diagnosis not present

## 2017-12-02 DIAGNOSIS — E860 Dehydration: Secondary | ICD-10-CM

## 2017-12-02 DIAGNOSIS — Z87891 Personal history of nicotine dependence: Secondary | ICD-10-CM

## 2017-12-02 HISTORY — PX: IR PATIENT EVAL TECH 0-60 MINS: IMG5564

## 2017-12-02 LAB — CBC WITH DIFFERENTIAL (CANCER CENTER ONLY)
ABS IMMATURE GRANULOCYTES: 0.04 10*3/uL (ref 0.00–0.07)
BASOS PCT: 0 %
Basophils Absolute: 0 10*3/uL (ref 0.0–0.1)
Eosinophils Absolute: 0.1 10*3/uL (ref 0.0–0.5)
Eosinophils Relative: 1 %
HCT: 36.8 % — ABNORMAL LOW (ref 39.0–52.0)
HEMOGLOBIN: 11.8 g/dL — AB (ref 13.0–17.0)
IMMATURE GRANULOCYTES: 0 %
LYMPHS ABS: 0.5 10*3/uL — AB (ref 0.7–4.0)
LYMPHS PCT: 3 %
MCH: 29.7 pg (ref 26.0–34.0)
MCHC: 32.1 g/dL (ref 30.0–36.0)
MCV: 92.7 fL (ref 80.0–100.0)
Monocytes Absolute: 0.3 10*3/uL (ref 0.1–1.0)
Monocytes Relative: 2 %
NEUTROS ABS: 13.4 10*3/uL — AB (ref 1.7–7.7)
NEUTROS PCT: 94 %
PLATELETS: 248 10*3/uL (ref 150–400)
RBC: 3.97 MIL/uL — ABNORMAL LOW (ref 4.22–5.81)
RDW: 16.3 % — ABNORMAL HIGH (ref 11.5–15.5)
WBC Count: 14.2 10*3/uL — ABNORMAL HIGH (ref 4.0–10.5)
nRBC: 0 % (ref 0.0–0.2)

## 2017-12-02 LAB — CANCER ANTIGEN 19-9: CA 19-9: 310 U/mL — ABNORMAL HIGH (ref 0–35)

## 2017-12-02 MED ORDER — DEXTROSE 5 % IV SOLN: 2.0000 g | Freq: Once | INTRAVENOUS | Status: DC

## 2017-12-02 MED ORDER — ONDANSETRON HCL 4 MG/2ML IJ SOLN
4.0000 mg | Freq: Once | INTRAMUSCULAR | Status: AC
  Administered 2017-12-02: 4 mg via INTRAVENOUS

## 2017-12-02 MED ORDER — SODIUM CHLORIDE 0.9 % IV SOLN
2.0000 g | Freq: Once | INTRAVENOUS | Status: AC
  Administered 2017-12-02: 2 g via INTRAVENOUS
  Filled 2017-12-02: qty 20

## 2017-12-02 MED ORDER — SODIUM CHLORIDE 0.9 % IV SOLN
INTRAVENOUS | Status: DC
  Administered 2017-12-02: 13:00:00 via INTRAVENOUS
  Filled 2017-12-02 (×2): qty 250

## 2017-12-02 MED ORDER — ONDANSETRON HCL 4 MG/2ML IJ SOLN
INTRAMUSCULAR | Status: AC
  Filled 2017-12-02: qty 2

## 2017-12-02 NOTE — Procedures (Signed)
Patient came in today with complaint of biliary tube not draining.  I evaluated the tube.  It flushed easily.  The patient stated that he was unable to aspirate bile.  I placed a new drain bag to gravity and got minimal bloody bile bile draining into the bag.  I discussed this with Dr Barbie Banner.  I explained to the patient that he had an internal/ external biliary drain catheter so it was draining internal which is the desired goal. .  It flushed easily and the statlock and sutures were intact at the original position.  No images were requested.  The patient was advised to call us if any further problems arise.  Otherwise , he will return for a routine exchange at his scheduled interval.

## 2017-12-02 NOTE — Progress Notes (Addendum)
Sympt[facility] Management Clinic Progress Note   Matthew Lewis [id number] 10-06-1955 62 y.o.  Matthew Lewis is managed by Matthew. Truitt Lewis  Actively treated with chemotherapy/immunotherapy/hormonal therapy: no  Assessment: Plan:    Rigors - Plan: CBC with Differential ([place] Only), Culture, Blood, Culture, Blood, cefTRIAXone (ROCEPHIN) 2 g in sodium chloride 0.9 % 100 mL IVPB, DISCONTINUED: cefTRIAXone (ROCEPHIN) 2 g in dextrose 5 % 50 mL IVPB, CANCELED: Urinalysis, Complete w Microscopic, CANCELED: Urine Culture  Dehydration - Plan: 0.9 %  sodium chloride infusion  Non-intractable vomiting with nausea, unspecified vomiting type - Plan: ondansetron (ZOFRAN) injection 4 mg  Intrahepatic cholangiocarcinoma (HCC)   Rigors: The patient was seen after having his biliary drain flushed earlier today.  Blood cultures x2, urine, and urine culture were collected.  The patient was dosed with Rocephin 2 g IV x1.  He will return tomorrow for additional IV hydration and for Rocephin 2 g IV again.  He will be sent home with Keflex given his history of a Klebsiella infection previously.  Dehydration: The patient was given 1 L of normal saline IV.  Nausea and vomiting: Patient was given Zofran 4 mg IV x1.  Intrahepatic cholangiocarcinoma: Matthew Lewis continues to await the start of chemotherapy.  He initially was scheduled to begin chemotherapy tomorrow however given his rigors from earlier today and treatment of an occult infection this will be held.    Please see After Visit Summary for patient specific instructions.  Future Appointments  Date Time Provider Matthew Lewis [facility] None    Orders Placed This Encounter  Procedures  . Culture, Blood  . Culture, Blood  . CBC with Differential (Cancer Center Only)       Subjective:   Patient ID:  Matthew Lewis is a 62 y.o. (DOB 10-07-1955) male.  Chief Complaint:  Chief Complaint    Patient presents with  . Chills    HPI Matthew Lewis is a 62 year old male with an intrahepatic cholangiocarcinoma who is managed by Matthew. Truitt Lewis.  He was seen earlier today in interventional radiology at which time his biliary drain was flushed.  He reports that there was difficulty flushing the drain.  He reports that a significant amount of pressure had to be applied to the syringe to flush the drain.  He had pain in his abdomen when the drain was flushed.  He went home after procedure and developed rigors which were significant.  His labs from yesterday showed a WBC that was slightly elevated at 12.3 with a CBC today again showing a WBC that was slightly higher at 14.2.  The patient's ANC was 13.4.  On presenting to the clinic today it was noted that his blood pressure was slightly low at 102/69 with his pulse elevated at 128.  His respirations were elevated at 24.  He was afebrile however with a temperature of 98.8.  His oxygen saturation was 100% on room air.  The patient reports that he has had similar episodes of being seen at the hospital and then having rigors afterwards.  He does have a history of a Klebsiella infection previously.  He is not currently on any antibiotics.  He continues to have jaundice.  His labs from yesterday showed a bilirubin of 1.8.  He was scheduled to return tomorrow for the start of chemotherapy.  He is having nausea today.  He vomited once when he was up to go to the  bathroom.  He reports that he is not sleeping well as he has to get up multiple times throughout the evening to empty his biliary drain.  Medications: I have reviewed the patient's current medications.  Allergies:  Allergies  Allergen Reactions  . Morphine And Related Other (See Comments)    PATIENT IS RECOVERING FROM DRUG ADDICTION PATIENT WANTS TO AVOID ANY NARCOTICS  . Other Other (See Comments)    PATIENT IS RECOVERING FROM DRUG ADDICTION PATIENT WANTS TO AVOID ANY NARCOTICS    Past  Medical History:  Diagnosis Date  . Abscess of anal and rectal regions    horse shoe abscess  . Alcoholic cirrhosis ([place])   . Arthritis    "everywhere; especially in my spine"  . Cirrhosis ([place])    STAGE 1 CIRRHOSIS PATIENT SEES Matthew Matthew Lewis FOR  . Colon cancer ([place]) 05/2005   TX SURGERY WITH LYMPH NODE REMOVAL  . Cough    LAST FEW WEEKS SAW Matthew Lewis LUNGS MILKY SPUTUM OCC  . DM type 2 with diabetic peripheral neuropathy (HCC)    left foot  . Fatigue   . Fistula, anal    multiple  . History of substance abuse ([place])    last alcohol was 08/2011; last marijuania was 08/2011  . Hypertension   . PTSD (post-traumatic stress disorder)    "severe" HX OF  . Sleep deprivation     Past Surgical History:  Procedure Laterality Date  . ABSCESS DRAINAGE     "probably 15-20 so far at least; rectal"  . ANAL FISTULECTOMY  12/13/1998  . ANTERIOR FUSION CERVICAL SPINE  2010   "triple"; "did 2 surgeries on the same day in [id number] due to complications"  . CORONARY ANGIOPLASTY  2001  . detached muscle  2010   right chest; "after cervical fusion complications"  . ELBOW SURGERY  2007   "cut out part of a muscle"; left  . EXAMINATION UNDER ANESTHESIA  03/22/2005   fistula  . EXAMINATION UNDER ANESTHESIA  06/06/2011   Procedure: EXAM UNDER ANESTHESIA;  Surgeon: Matthew Klein, MD;  Location: [place];  Service: General;  Laterality: N/A;  . HEMICOLECTOMY  06/06/2005   left  . INCISION AND DRAINAGE PERIRECTAL ABSCESS  03/30/2005  . INCISION AND DRAINAGE PERIRECTAL ABSCESS  12/13/2004  . INCISION AND DRAINAGE PERIRECTAL ABSCESS  07/14/2001  . INCISION AND DRAINAGE PERIRECTAL ABSCESS  08/12/2010   horseshoe abscess; Matthew Matthew Lewis  . INCISION AND DRAINAGE PERIRECTAL ABSCESS  06/06/2011   Procedure: IRRIGATION AND DEBRIDEMENT PERIRECTAL ABSCESS;  Surgeon: Matthew Klein, MD;  Location: [place];  Service: General;  Laterality: N/A;  . INCISION AND DRAINAGE PERIRECTAL ABSCESS N/A 02/24/2017   Procedure: IRRIGATION AND  DEBRIDEMENT PERIRECTAL ABSCESS;  Surgeon: Matthew Ok, MD;  Location: [place];  Service: General;  Laterality: N/A;  . IR EXCHANGE BILIARY DRAIN  11/07/2017  . IR INT EXT BILIARY DRAIN WITH CHOLANGIOGRAM  10/26/2017  . IR PATIENT EVAL TECH 0-60 MINS  12/02/2017  . IR RADIOLOGIST EVAL & MGMT  11/06/2017  . [place]  . PERCUTANEOUS PINNING PHALANX FRACTURE OF HAND  ~ 2008   "plates in 2 places"; right  . PLACEMENT OF SETON N/A 09/05/2017   Procedure: PLACEMENT OF SETON;  Surgeon: Leighton Ruff, MD;  Location: [facility];  Service: General;  Laterality: N/A;  . RADIOLOGY WITH ANESTHESIA N/A 10/25/2017   Procedure: MRI WITH ANESTHESIA;  Surgeon: Radiologist, Medication, MD;  Location: [place];  Service: Radiology;  Laterality:  N/A;  . THORACIC DISCECTOMY  1990's    Family History  Problem Relation Age of Onset  . Heart disease Father   . Heart disease Mother   . Cancer Mother   . Cancer Sister   . Heart disease Brother     Social History   Socioeconomic History  . Marital status: Married    Spouse name: Not on file  . Number of children: Not on file  . Years of education: Not on file  . Highest education level: Not on file  Occupational History  . Occupation: works at [employer]  . Financial resource strain: Not on file  . Food insecurity:    Worry: Not on file    Inability: Not on file  . Transportation needs:    Medical: Not on file    Non-medical: Not on file  Tobacco Use  . Smoking status: Former Smoker    Packs/day: 1.00    Years: 10.00    Pack years: 10.00    Types: Cigarettes    Last attempt to quit: 01/08/1991    Years since quitting: 26.9  . Smokeless tobacco: Never Used  Substance and Sexual Activity  . Alcohol use: Yes    Comment: 06/05/11 "drank enough when I was younger to last me my whole life; don't remember when I had my last drink, maybe 2012"  . Drug use: Not Currently    Types: Oxycodone, Marijuana      Comment: 06/05/11 "pot head in my younger days; 2009 Matthew had me on oxycodone and valium for almost 37yr  . Sexual activity: Not Currently  Lifestyle  . Physical activity:    Days per week: Not on file    Minutes per session: Not on file  . Stress: To some extent  Relationships  . Social connections:    Talks on phone: Not on file    Gets together: Not on file    Attends religious service: Not on file    Active member of club or organization: Not on file    Attends meetings of clubs or organizations: Not on file    Relationship status: Not on file  . Intimate partner violence:    Fear of current or ex partner: Not on file    Emotionally abused: Not on file    Physically abused: Not on file    Forced sexual activity: Not on file  Other Topics Concern  . Not on file  Social History Narrative  . Not on file    Past Medical History, Surgical history, Social history, and Family history were reviewed and updated as appropriate.   Please see review of systems for further details on the patient's review from today.   Review of Systems:  Review of Systems  Constitutional: Positive for chills. Negative for activity change, appetite change, diaphoresis and fever.  HENT: Negative for trouble swallowing.   Respiratory: Negative for cough, chest tightness and shortness of breath.   Cardiovascular: Negative for chest pain, palpitations and leg swelling.  Gastrointestinal: Positive for abdominal pain, nausea and vomiting. Negative for abdominal distention, constipation and diarrhea.  Psychiatric/Behavioral: Positive for sleep disturbance.    Objective:   Physical Exam:  BP 102/69 (BP Location: Left Arm, Patient Position: Sitting)   Pulse (!) 128   Temp 98.8 F (37.1 C) (Oral)   Resp (!) 24   SpO2 100%  ECOG: You to  Physical Exam  Constitutional: No distress.  HENT:  Head: Normocephalic and atraumatic.  Mouth/Throat: Oropharynx is clear and moist.  Eyes: Scleral icterus is  present.  Cardiovascular: Normal rate, regular rhythm and normal heart sounds. Exam reveals no gallop and no friction rub.  No murmur heard. Pulmonary/Chest: Effort normal and breath sounds normal. No respiratory distress. He has no wheezes. He has no rales.  Abdominal: Soft. Bowel sounds are normal. He exhibits no distension and no mass. There is no tenderness. There is no rebound and no guarding.  Biliary drain in the right upper abdomen  Neurological: He is alert.  Skin: Skin is warm and dry. No rash noted. He is not diaphoretic. No erythema.  The patient is jaundiced    Lab Review:     Component Value Date/Time   NA 133 (L) 12/01/2017 1529   K 4.8 12/01/2017 1529   CL 103 12/01/2017 1529   CO2 20 (L) 12/01/2017 1529   GLUCOSE 126 (H) 12/01/2017 1529   BUN 16 12/01/2017 1529   CREATININE 0.87 12/01/2017 1529   CALCIUM 10.3 12/01/2017 1529   PROT 9.7 (H) 12/01/2017 1529   ALBUMIN 3.4 (L) 12/01/2017 1529   AST 54 (H) 12/01/2017 1529   ALT 46 (H) 12/01/2017 1529   ALKPHOS 622 (H) 12/01/2017 1529   BILITOT 1.8 (H) 12/01/2017 1529   GFRNONAA >60 12/01/2017 1529   GFRAA >60 12/01/2017 1529       Component Value Date/Time   WBC 14.2 (H) 12/02/2017 1252   WBC 6.2 11/24/2017 0556   RBC 3.97 (L) 12/02/2017 1252   HGB 11.8 (L) 12/02/2017 1252   HGB 15.2 07/18/2006 1420   HCT 36.8 (L) 12/02/2017 1252   HCT 42.8 07/18/2006 1420   PLT 248 12/02/2017 1252   PLT 193 07/18/2006 1420   MCV 92.7 12/02/2017 1252   MCV 93.3 07/18/2006 1420   MCH 29.7 12/02/2017 1252   MCHC 32.1 12/02/2017 1252   RDW 16.3 (H) 12/02/2017 1252   RDW 13.5 07/18/2006 1420   LYMPHSABS 0.5 (L) 12/02/2017 1252   LYMPHSABS 3.0 07/18/2006 1420   MONOABS 0.3 12/02/2017 1252   MONOABS 0.7 07/18/2006 1420   EOSABS 0.1 12/02/2017 1252   EOSABS 0.3 07/18/2006 1420   BASOSABS 0.0 12/02/2017 1252   BASOSABS 0.1 07/18/2006 1420   -------------------------------  Imaging from last 24 hours (if  applicable):  Radiology interpretation: Dg Chest 2 View  Result Date: 11/08/2017 CLINICAL DATA:  62 year old male with sepsis. EXAM: CHEST - 2 VIEW COMPARISON:  Chest CT dated 10/27/2017 FINDINGS: There is cardiomegaly with probable mild vascular congestion. No focal consolidation or pneumothorax. Bilateral lung consolidation/masses seen on the prior CT are suboptimally visualized on this radiograph. Possible trace right pleural effusion. No acute osseous pathology. A pigtail tube is noted in the upper abdomen likely corresponding to the percutaneous biliary drain catheter. IMPRESSION: 1. Cardiomegaly with probable mild vascular congestion. 2. Suboptimal visualization of the pulmonary masses/nodules seen on the prior CT. 3. Probable small right pleural effusion. Electronically Signed   By: Anner Crete M.D.   On: 11/08/2017 21:22   Ct Abdomen Pelvis W Contrast  Result Date: 11/08/2017 CLINICAL DATA:  62 year old male with history of lung cancer presenting with generalized weakness and jaundice. EXAM: CT ABDOMEN AND PELVIS WITH CONTRAST TECHNIQUE: Multidetector CT imaging of the abdomen and pelvis was performed using the standard protocol following bolus administration of intravenous contrast. CONTRAST:  175m ISOVUE-300 IOPAMIDOL (ISOVUE-300) INJECTION 61% COMPARISON:  CT of the abdomen pelvis dated 11/06/2016 FINDINGS: Lower chest: Partially visualized small right pleural effusion with associated  compressive atelectasis of the right lower lobe. A partially visualized 5.1 x 3.8 cm right lung base mass and a 1.8 x 2.3 cm left lung base nodule. There is coronary vascular calcification. Small pocket of pneumoperitoneum along the lateral aspect of the liver related to biliary drain catheter placement. There is a small ascites. Hepatobiliary: Morphologic changes of cirrhosis. Multiple hepatic hypodense lesions as seen previously most consistent with metastatic disease. Ill-defined 2.1 x 5.1 cm hypodense area  in the left lobe of the liver as seen previously appears to communicate with the left portal vein and concerning for a tumor thrombus or a bland thrombus within the left portal vein. A primary biliary neoplasm such as cholangiocarcinoma or metastatic disease are not excluded. There is layering sludge or small stones within the gallbladder. A percutaneous biliary drain with pigtail tip noted in similar position. No significant interval change in the dilatation of the intrahepatic biliary trees compared to the most recent prior CT. Pancreas: Unremarkable. No pancreatic ductal dilatation or surrounding inflammatory changes. Spleen: Normal in size without focal abnormality. Adrenals/Urinary Tract: Bilateral adrenal masses most consistent with metastatic disease. There is no hydronephrosis on either side. Punctate nonobstructing left renal inferior pole calculus. The visualized ureters and urinary bladder appear unremarkable. Stomach/Bowel: Surgical material noted in the region of the rectum similar to prior CT. Postsurgical changes and suture in the descending colon. There is no bowel obstruction. Mild thickened appearance of the hepatic flexure, likely reactive to cirrhosis and ascites/hepatic colopathy. Colitis is less likely. Clinical correlation is recommended. The appendix is normal. Vascular/Lymphatic: Mild atherosclerotic calcification of the aorta. No portal venous gas. Retroperitoneal adenopathy. A 3.6 x 2.2 cm nodular density with surrounding there is no plastic reaction in the left lower abdomen (series 2, image 65) most consistent with mesenteric or serosal metastatic disease/implant. Reproductive: The prostate and seminal vesicles are grossly unremarkable. Other: Mild diffuse subcutaneous edema. Musculoskeletal: Degenerative changes of the spine. Bilateral L5 pars defects with grade 1 L5-S1 anterolisthesis. No acute osseous pathology. IMPRESSION: 1. Overall no significant interval change since the CT of  11/06/2017. Stable positioning of the percutaneous biliary drain with similar degree of biliary ductal dilatation. 2. Cirrhosis with multiple hepatic metastatic disease. Infiltrative appearing lesion in the left lobe of the liver appears to extend into the left portal vein and may represent a tumor thrombus, or bland thrombus. A primary biliary neoplasm (cholangiocarcinoma, or metastatic disease are other considerations. 3. Retroperitoneal, mesenteric, and adrenal metastatic disease. 4. Hepatic colopathy versus less likely segmental colitis of the hepatic flexure. 5. Small right pleural effusion and bilateral lung base pulmonary masses/nodules as seen previously. 6. Tiny pockets of pneumoperitoneum lateral to the liver along the percutaneous drain, decreased since the prior CT. 7. Other findings as above. Electronically Signed   By: Anner Crete M.D.   On: 11/08/2017 22:50   Ct Abdomen Pelvis W Contrast  Result Date: 11/06/2017 CLINICAL DATA:  Infiltrative lesion within the liver. Suspicion of cholangiocarcinoma. Cirrhotic liver. EXAM: CT ABDOMEN AND PELVIS WITH CONTRAST TECHNIQUE: Multidetector CT imaging of the abdomen and pelvis was performed using the standard protocol following bolus administration of intravenous contrast. CONTRAST:  123m ISOVUE-300 IOPAMIDOL (ISOVUE-300) INJECTION 61% COMPARISON:  CT 10/27/2017, 02/24/2017, MRI 10/25/2017 FINDINGS: Lower chest: Partially loculated fluid at the RIGHT lung base is increased compared to prior. Basilar atelectasis. There is masslike consolidation in the RIGHT lower lobe measuring 3.7 cm unchanged. In LEFT lower lobe there is nodular metastasis measuring 2.6 cm not significant changed. Hepatobiliary:  Percutaneous drain extends the porta hepatis from a RIGHT flank approach. Small pneumothorax at the RIGHT lateral lung base (image 27/4) associated with the chest tube. Centrally within liver, this ill-defined mass in central LEFT hepatic lobe measuring  approximately 5.1 by 5.1 cm. There is biliary obstruction of the LEFT hepatic lobe. There are 2 round discrete lesion in the RIGHT hepatic lobe. One the subcapsular anterior RIGHT hepatic lobe measuring 2.3 cm. One in the inferior RIGHT hepatic lobe measuring 1.8 cm Gallbladder is normal. Common bile duct normal. Liver has a nodular contour with enlargement of the caudate lobe Pancreas: Normal pancreatic parenchymal intensity. No ductal dilatation or inflammation. Spleen: Normal spleen. Adrenals/urinary tract: Adrenal glands demonstrate bilateral metastatic lesions not changed. Kidneys, ureters and bladder normal Stomach/Bowel: Stomach, small-bowel normal. There is anastomosis along the descending colon. No obstruction. Rectum normal. Vascular/Lymphatic: Abdominal aorta normal caliber. No retroperitoneal periportal adenopathy. Metastatic lymph node positioned between the IVC in the aorta measures 16 mm short axis not changed from prior. No pelvic lymphadenopathy. No inguinal adenopathy. Reproductive: Prostate normal Musculoskeletal: No aggressive osseous lesion. IMPRESSION: 1. Increase in RIGHT pleural effusion. Stable masslike consolidation at the RIGHT lung base representing neoplasm versus pneumonia. 2. Stable metastatic nodule at the RIGHT lung base. 3. Infiltrative mass in the central LEFT hepatic lobe and discrete lesions in the RIGHT hepatic lobe consistent with metastatic disease versus primary hepatic biliary carcinoma. Favor cholangiocarcinoma. 4. Percutaneous drainage catheter extends the porta hepatis. Small trapped pneumothorax along the tract of the catheter at the inferolateral RIGHT lung base. 5. Stable adrenal metastasis. 6. Metastatic adenopathy in the retroperitoneum unchanged. 7. Morphologic changes in liver consistent with  cirrhosis. Electronically Signed   By: Suzy Bouchard M.D.   On: 11/06/2017 17:04   Dg Sinus/fist Tube Chk-non Gi  Result Date: 11/06/2017 INDICATION: Internal  external biliary drain, decreased output EXAM: FLUOROSCOPIC INJECTION OF THE RIGHT INTERNAL EXTERNAL BILIARY DRAIN MEDICATIONS: NONE. ANESTHESIA/SEDATION: NONE. COMPLICATIONS: None immediate. PROCEDURE: Under sterile conditions, the existing right internal external biliary drain was injected with fluoroscopic imaging performed. This demonstrates accumulation of contrast at the right liver edge. Drain has significantly retracted with the catheter tip positioned in the proximal CBD. Distal aspect of the biliary drain does not opacify. Therefore, the tube may also be occluded. There is little bile output from the catheter. IMPRESSION: Significant retraction of the internal external biliary drain now with the tip position in the proximal CBD. Contrast injection accumulates at the liver margin where exposed sideholes are likely extrahepatic. Biliary system is not opacified. PLAN: Schedule for internal external biliary exchange and reposition tomorrow. Electronically Signed   By: Jerilynn Mages.  Shick M.D.   On: 11/06/2017 13:49   Dg Chest Port 1 View  Result Date: 11/20/2017 CLINICAL DATA:  62 y/o M; metastatic right adrenal gland cancer. Weakness with onset yesterday and fall. EXAM: PORTABLE CHEST 1 VIEW COMPARISON:  11/08/2017 chest radiograph. FINDINGS: The heart size and mediastinal contours are within normal limits. Both lungs are clear. ACDF hardware partially visualized. No acute osseous abnormality is evident. IMPRESSION: No active disease. Electronically Signed   By: Kristine Garbe M.D.   On: 11/20/2017 16:00   Ir Patient Eval Tech 0-60 Mins  Result Date: 12/02/2017 Chipper O[place]     12/02/2017 12:46 PM Patient came in today with complaint of biliary tube not draining.  I evaluated the tube.  It flushed easily.  The patient stated that he was unable to aspirate bile.  I placed a new drain  bag to gravity and got minimal bloody bile bile draining into the bag.  I discussed this with Matthew Barbie Banner.  I explained  to the patient that he had an internal/ external biliary drain catheter so it was draining internal which is the desired goal. .  It flushed easily and the statlock and sutures were intact at the original position.  No images were requested.  The patient was advised to call us if any further problems arise.  Otherwise , he will return for a routine exchange at his scheduled interval.      Ir Exchange Biliary Drain  Result Date: 11/08/2017 INDICATION: 62 year old male with a history of previous percutaneous placement of internal external biliary drain. Drain has become withdrawn and he presents for replacement. EXAM: IMAGE GUIDED REPLACEMENT OF INTERNAL/EXTERNAL BILIARY DRAIN. MEDICATIONS: None ANESTHESIA/SEDATION: Moderate (conscious) sedation was employed during this procedure. A total of Versed 1.0 mg and Fentanyl 25 mcg was administered intravenously. Moderate Sedation Time: 10 minutes. The patient's level of consciousness and vital signs were monitored continuously by radiology nursing throughout the procedure under my direct supervision. FLUOROSCOPY TIME:  Fluoroscopy Time: 1 minutes 54 seconds (121 mGy). COMPLICATIONS: None PROCEDURE: Informed written consent was obtained from the patient after a thorough discussion of the procedural risks, benefits and alternatives. All questions were addressed. Maximal Sterile Barrier Technique was utilized including caps, mask, sterile gowns, sterile gloves, sterile drape, hand hygiene and skin antiseptic. A timeout was performed prior to the initiation of the procedure. Patient positioned supine position on the II table. Images were stored sent to PACs. Patient is prepped and draped in the usual sterile fashion including the indwelling drain. Gentle injection of contrast confirmed the location of the existing drain. Using modified Seldinger technique, the internal external biliary drain was exchanged for a new 10 [place] drain, with the loop curled within the duodenum.  Final images were stored confirming flow antegrade. Catheter was sutured in position attached to gravity drainage. Patient tolerated the procedure well and remained hemodynamically stable throughout. No complications were encountered and no significant blood loss. IMPRESSION: Status post exchange of displaced internal/external biliary drain with placement of a new 10 French tube. Signed, Dulcy Fanny. Dellia Nims, RPVI [facility] Electronically Signed   By: Corrie Mckusick D.O.   On: 11/08/2017 08:30   Ir Radiologist Eval & Mgmt  Result Date: 11/06/2017 Please refer to notes tab for details about interventional procedure. (Op Note)       This patient was seen with Matthew. Burr Medico with my treatment plan reviewed with her. She expressed agreement with my medical management of this patient.  Addendum  I have seen the patient, examined him. I agree with the assessment and and plan and have edited the notes.   I saw Matthew Lewis in my office yesterday, he was doing well yesterday and we planned to start chemo tomorrow.  He noticed the biliary drain got clogged this morning and went to IR for flushing. He developed significant after the flush, and also feels nauseated and fatigued.  I am concerned about recurrent infection due to the biliary drain tube, and recommended hospital admission. However pt declined going to hospital but agrees close f/u with [place] tomorrow morning. ID work up including blood cultures were drawn, and I gave IVF and ceftriaxone 2 g today in the clinic.  He felt much better and was discharged home in stable condition.  He will return tomorrow morning for follow-up, more IV fluids  and antibiotics.  He knows to go to the emergency room if he spikes fever again or feels worse tonight.   Matthew Lewis  12/03/2017

## 2017-12-02 NOTE — Patient Instructions (Signed)

## 2017-12-02 NOTE — Telephone Encounter (Signed)
Per 11/25 no los

## 2017-12-03 ENCOUNTER — Inpatient Hospital Stay: Payer: Medicare Other

## 2017-12-03 ENCOUNTER — Emergency Department (HOSPITAL_COMMUNITY): Payer: Medicare Other

## 2017-12-03 ENCOUNTER — Other Ambulatory Visit: Payer: Self-pay | Admitting: Hematology

## 2017-12-03 ENCOUNTER — Inpatient Hospital Stay (HOSPITAL_COMMUNITY): Payer: Medicare Other

## 2017-12-03 ENCOUNTER — Other Ambulatory Visit (HOSPITAL_COMMUNITY): Payer: Self-pay

## 2017-12-03 ENCOUNTER — Other Ambulatory Visit: Payer: Self-pay | Admitting: Emergency Medicine

## 2017-12-03 ENCOUNTER — Other Ambulatory Visit: Payer: Self-pay

## 2017-12-03 ENCOUNTER — Encounter (HOSPITAL_COMMUNITY): Payer: Self-pay

## 2017-12-03 ENCOUNTER — Telehealth: Payer: Self-pay

## 2017-12-03 ENCOUNTER — Encounter: Payer: Self-pay | Admitting: Medical

## 2017-12-03 ENCOUNTER — Encounter: Payer: Self-pay | Admitting: Hematology

## 2017-12-03 ENCOUNTER — Inpatient Hospital Stay (HOSPITAL_COMMUNITY)
Admission: EM | Admit: 2017-12-03 | Discharge: 2017-12-11 | DRG: 981 | Disposition: A | Discharge status: Home Health | Payer: Medicare Other | Source: Other Acute Inpatient Hospital | Attending: Internal Medicine | Admitting: Internal Medicine

## 2017-12-03 ENCOUNTER — Inpatient Hospital Stay (HOSPITAL_BASED_OUTPATIENT_CLINIC_OR_DEPARTMENT_OTHER): Payer: Medicare Other | Admitting: Medical

## 2017-12-03 VITALS — BP 130/78 | HR 145 | Temp 99.7°F | Resp 30

## 2017-12-03 DIAGNOSIS — C221 Intrahepatic bile duct carcinoma: Secondary | ICD-10-CM

## 2017-12-03 DIAGNOSIS — E861 Hypovolemia: Secondary | ICD-10-CM | POA: Diagnosis present

## 2017-12-03 DIAGNOSIS — C7970 Secondary malignant neoplasm of unspecified adrenal gland: Secondary | ICD-10-CM

## 2017-12-03 DIAGNOSIS — C7800 Secondary malignant neoplasm of unspecified lung: Secondary | ICD-10-CM | POA: Diagnosis not present

## 2017-12-03 DIAGNOSIS — Z452 Encounter for adjustment and management of vascular access device: Secondary | ICD-10-CM | POA: Diagnosis not present

## 2017-12-03 DIAGNOSIS — I959 Hypotension, unspecified: Secondary | ICD-10-CM | POA: Diagnosis present

## 2017-12-03 DIAGNOSIS — R1310 Dysphagia, unspecified: Secondary | ICD-10-CM | POA: Diagnosis present

## 2017-12-03 DIAGNOSIS — K8309 Other cholangitis: Secondary | ICD-10-CM

## 2017-12-03 DIAGNOSIS — Z87891 Personal history of nicotine dependence: Secondary | ICD-10-CM

## 2017-12-03 DIAGNOSIS — E860 Dehydration: Secondary | ICD-10-CM | POA: Diagnosis present

## 2017-12-03 DIAGNOSIS — Z794 Long term (current) use of insulin: Secondary | ICD-10-CM

## 2017-12-03 DIAGNOSIS — R6521 Severe sepsis with septic shock: Secondary | ICD-10-CM | POA: Diagnosis present

## 2017-12-03 DIAGNOSIS — K7030 Alcoholic cirrhosis of liver without ascites: Secondary | ICD-10-CM | POA: Diagnosis present

## 2017-12-03 DIAGNOSIS — I10 Essential (primary) hypertension: Secondary | ICD-10-CM | POA: Diagnosis present

## 2017-12-03 DIAGNOSIS — E872 Acidosis: Secondary | ICD-10-CM | POA: Diagnosis present

## 2017-12-03 DIAGNOSIS — D649 Anemia, unspecified: Secondary | ICD-10-CM | POA: Diagnosis present

## 2017-12-03 DIAGNOSIS — Z66 Do not resuscitate: Secondary | ICD-10-CM

## 2017-12-03 DIAGNOSIS — E1142 Type 2 diabetes mellitus with diabetic polyneuropathy: Secondary | ICD-10-CM | POA: Diagnosis present

## 2017-12-03 DIAGNOSIS — C786 Secondary malignant neoplasm of retroperitoneum and peritoneum: Secondary | ICD-10-CM

## 2017-12-03 DIAGNOSIS — C787 Secondary malignant neoplasm of liver and intrahepatic bile duct: Secondary | ICD-10-CM | POA: Diagnosis not present

## 2017-12-03 DIAGNOSIS — I2510 Atherosclerotic heart disease of native coronary artery without angina pectoris: Secondary | ICD-10-CM | POA: Diagnosis present

## 2017-12-03 DIAGNOSIS — A419 Sepsis, unspecified organism: Secondary | ICD-10-CM

## 2017-12-03 DIAGNOSIS — K7031 Alcoholic cirrhosis of liver with ascites: Secondary | ICD-10-CM | POA: Diagnosis present

## 2017-12-03 DIAGNOSIS — R509 Fever, unspecified: Secondary | ICD-10-CM

## 2017-12-03 DIAGNOSIS — Z8249 Family history of ischemic heart disease and other diseases of the circulatory system: Secondary | ICD-10-CM

## 2017-12-03 DIAGNOSIS — A4150 Gram-negative sepsis, unspecified: Secondary | ICD-10-CM | POA: Diagnosis present

## 2017-12-03 DIAGNOSIS — K831 Obstruction of bile duct: Secondary | ICD-10-CM | POA: Diagnosis not present

## 2017-12-03 DIAGNOSIS — C772 Secondary and unspecified malignant neoplasm of intra-abdominal lymph nodes: Secondary | ICD-10-CM | POA: Diagnosis present

## 2017-12-03 DIAGNOSIS — Z8509 Personal history of malignant neoplasm of other digestive organs: Secondary | ICD-10-CM | POA: Diagnosis not present

## 2017-12-03 DIAGNOSIS — R6520 Severe sepsis without septic shock: Secondary | ICD-10-CM

## 2017-12-03 DIAGNOSIS — C7972 Secondary malignant neoplasm of left adrenal gland: Secondary | ICD-10-CM | POA: Diagnosis present

## 2017-12-03 DIAGNOSIS — E440 Moderate protein-calorie malnutrition: Secondary | ICD-10-CM | POA: Diagnosis present

## 2017-12-03 DIAGNOSIS — C7971 Secondary malignant neoplasm of right adrenal gland: Secondary | ICD-10-CM | POA: Diagnosis present

## 2017-12-03 DIAGNOSIS — M1990 Unspecified osteoarthritis, unspecified site: Secondary | ICD-10-CM | POA: Diagnosis present

## 2017-12-03 DIAGNOSIS — C801 Malignant (primary) neoplasm, unspecified: Secondary | ICD-10-CM | POA: Diagnosis not present

## 2017-12-03 DIAGNOSIS — E11649 Type 2 diabetes mellitus with hypoglycemia without coma: Secondary | ICD-10-CM | POA: Diagnosis present

## 2017-12-03 DIAGNOSIS — E871 Hypo-osmolality and hyponatremia: Secondary | ICD-10-CM | POA: Diagnosis present

## 2017-12-03 DIAGNOSIS — R69 Illness, unspecified: Secondary | ICD-10-CM | POA: Diagnosis not present

## 2017-12-03 DIAGNOSIS — Z885 Allergy status to narcotic agent status: Secondary | ICD-10-CM

## 2017-12-03 DIAGNOSIS — R7881 Bacteremia: Secondary | ICD-10-CM

## 2017-12-03 DIAGNOSIS — N179 Acute kidney failure, unspecified: Secondary | ICD-10-CM | POA: Diagnosis present

## 2017-12-03 DIAGNOSIS — F4310 Post-traumatic stress disorder, unspecified: Secondary | ICD-10-CM | POA: Diagnosis present

## 2017-12-03 DIAGNOSIS — Z1612 Extended spectrum beta lactamase (ESBL) resistance: Secondary | ICD-10-CM | POA: Diagnosis present

## 2017-12-03 DIAGNOSIS — Z9861 Coronary angioplasty status: Secondary | ICD-10-CM

## 2017-12-03 DIAGNOSIS — Z85038 Personal history of other malignant neoplasm of large intestine: Secondary | ICD-10-CM

## 2017-12-03 DIAGNOSIS — T8579XA Infection and inflammatory reaction due to other internal prosthetic devices, implants and grafts, initial encounter: Secondary | ICD-10-CM | POA: Diagnosis present

## 2017-12-03 DIAGNOSIS — R918 Other nonspecific abnormal finding of lung field: Secondary | ICD-10-CM | POA: Diagnosis not present

## 2017-12-03 DIAGNOSIS — Z7982 Long term (current) use of aspirin: Secondary | ICD-10-CM

## 2017-12-03 DIAGNOSIS — K7460 Unspecified cirrhosis of liver: Secondary | ICD-10-CM | POA: Diagnosis present

## 2017-12-03 DIAGNOSIS — B9620 Unspecified Escherichia coli [E. coli] as the cause of diseases classified elsewhere: Secondary | ICD-10-CM | POA: Diagnosis present

## 2017-12-03 DIAGNOSIS — I9589 Other hypotension: Secondary | ICD-10-CM | POA: Diagnosis not present

## 2017-12-03 DIAGNOSIS — C240 Malignant neoplasm of extrahepatic bile duct: Secondary | ICD-10-CM | POA: Diagnosis not present

## 2017-12-03 LAB — LACTIC ACID, PLASMA
LACTIC ACID, VENOUS: 2.9 mmol/L — AB (ref 0.5–1.9)
Lactic Acid, Venous: 3.7 mmol/L (ref 0.5–1.9)

## 2017-12-03 LAB — BLOOD CULTURE ID PANEL (REFLEXED)
ACINETOBACTER BAUMANNII: NOT DETECTED
ACINETOBACTER BAUMANNII: NOT DETECTED
CANDIDA ALBICANS: NOT DETECTED
CANDIDA PARAPSILOSIS: NOT DETECTED
CANDIDA TROPICALIS: NOT DETECTED
CARBAPENEM RESISTANCE: NOT DETECTED
Candida albicans: NOT DETECTED
Candida glabrata: NOT DETECTED
Candida glabrata: NOT DETECTED
Candida krusei: NOT DETECTED
Candida krusei: NOT DETECTED
Candida parapsilosis: NOT DETECTED
Candida tropicalis: NOT DETECTED
ENTEROBACTERIACEAE SPECIES: DETECTED — AB
ENTEROBACTERIACEAE SPECIES: DETECTED — AB
ENTEROCOCCUS SPECIES: NOT DETECTED
ESCHERICHIA COLI: DETECTED — AB
Enterobacter cloacae complex: NOT DETECTED
Enterobacter cloacae complex: NOT DETECTED
Enterococcus species: NOT DETECTED
Escherichia coli: DETECTED — AB
HAEMOPHILUS INFLUENZAE: NOT DETECTED
HAEMOPHILUS INFLUENZAE: NOT DETECTED
KLEBSIELLA OXYTOCA: NOT DETECTED
KLEBSIELLA PNEUMONIAE: NOT DETECTED
Klebsiella oxytoca: NOT DETECTED
Klebsiella pneumoniae: NOT DETECTED
LISTERIA MONOCYTOGENES: NOT DETECTED
Listeria monocytogenes: NOT DETECTED
NEISSERIA MENINGITIDIS: NOT DETECTED
Neisseria meningitidis: NOT DETECTED
PSEUDOMONAS AERUGINOSA: NOT DETECTED
Proteus species: NOT DETECTED
Proteus species: NOT DETECTED
Pseudomonas aeruginosa: NOT DETECTED
SERRATIA MARCESCENS: NOT DETECTED
STAPHYLOCOCCUS AUREUS BCID: NOT DETECTED
STREPTOCOCCUS SPECIES: NOT DETECTED
Serratia marcescens: NOT DETECTED
Staphylococcus aureus (BCID): NOT DETECTED
Staphylococcus species: NOT DETECTED
Staphylococcus species: NOT DETECTED
Streptococcus agalactiae: NOT DETECTED
Streptococcus agalactiae: NOT DETECTED
Streptococcus pneumoniae: NOT DETECTED
Streptococcus pneumoniae: NOT DETECTED
Streptococcus pyogenes: NOT DETECTED
Streptococcus pyogenes: NOT DETECTED
Streptococcus species: NOT DETECTED

## 2017-12-03 LAB — GLUCOSE, CAPILLARY
GLUCOSE-CAPILLARY: 44 mg/dL — AB (ref 70–99)
GLUCOSE-CAPILLARY: 55 mg/dL — AB (ref 70–99)
GLUCOSE-CAPILLARY: 74 mg/dL (ref 70–99)
Glucose-Capillary: 81 mg/dL (ref 70–99)
Glucose-Capillary: 89 mg/dL (ref 70–99)
Glucose-Capillary: 122 mg/dL — ABNORMAL HIGH (ref 70–99)
Glucose-Capillary: 209 mg/dL — ABNORMAL HIGH (ref 70–99)
Glucose-Capillary: 207 mg/dL — ABNORMAL HIGH (ref 70–99)

## 2017-12-03 LAB — CREATININE, SERUM: Creatinine, Ser: 1.05 mg/dL (ref 0.61–1.24)

## 2017-12-03 LAB — CBC WITH DIFFERENTIAL/PLATELET
ABS IMMATURE GRANULOCYTES: 0.01 10*3/uL (ref 0.00–0.07)
BASOS PCT: 0 %
Basophils Absolute: 0 10*3/uL (ref 0.0–0.1)
Eosinophils Absolute: 0 10*3/uL (ref 0.0–0.5)
Eosinophils Relative: 0 %
HCT: 33.2 % — ABNORMAL LOW (ref 39.0–52.0)
Hemoglobin: 10.4 g/dL — ABNORMAL LOW (ref 13.0–17.0)
Immature Granulocytes: 0 %
Lymphocytes Relative: 2 %
Lymphs Abs: 0.2 10*3/uL — ABNORMAL LOW (ref 0.7–4.0)
MCH: 29.9 pg (ref 26.0–34.0)
MCHC: 31.3 g/dL (ref 30.0–36.0)
MCV: 95.4 fL (ref 80.0–100.0)
MONO ABS: 0.1 10*3/uL (ref 0.1–1.0)
MONOS PCT: 1 %
NEUTROS ABS: 7.2 10*3/uL (ref 1.7–7.7)
Neutrophils Relative %: 97 %
PLATELETS: 146 10*3/uL — AB (ref 150–400)
RBC: 3.48 MIL/uL — ABNORMAL LOW (ref 4.22–5.81)
RDW: 16.3 % — ABNORMAL HIGH (ref 11.5–15.5)
WBC: 7.8 10*3/uL (ref 4.0–10.5)
nRBC: 0 % (ref 0.0–0.2)

## 2017-12-03 LAB — COMPREHENSIVE METABOLIC PANEL
ALBUMIN: 3.2 g/dL — AB (ref 3.5–5.0)
ALK PHOS: 368 U/L — AB (ref 38–126)
ALT: 51 U/L — ABNORMAL HIGH (ref 0–44)
ANION GAP: 14 (ref 5–15)
AST: 71 U/L — ABNORMAL HIGH (ref 15–41)
BILIRUBIN TOTAL: 4.4 mg/dL — AB (ref 0.3–1.2)
BUN: 25 mg/dL — ABNORMAL HIGH (ref 8–23)
CALCIUM: 9.1 mg/dL (ref 8.9–10.3)
CO2: 16 mmol/L — ABNORMAL LOW (ref 22–32)
Chloride: 98 mmol/L (ref 98–111)
Creatinine, Ser: 1.18 mg/dL (ref 0.61–1.24)
GFR calc non Af Amer: >60 mL/min (ref >60)
GLUCOSE: 95 mg/dL (ref 70–99)
Potassium: 4.1 mmol/L (ref 3.5–5.1)
Sodium: 128 mmol/L — ABNORMAL LOW (ref 135–145)
TOTAL PROTEIN: 8.4 g/dL — AB (ref 6.5–8.1)

## 2017-12-03 LAB — PROTIME-INR
INR: 1.51
Prothrombin Time: 18 seconds — ABNORMAL HIGH (ref 11.4–15.2)

## 2017-12-03 LAB — I-STAT CG4 LACTIC ACID, ED: Lactic Acid, Venous: 5.83 mmol/L (ref 0.5–1.9)

## 2017-12-03 MED ORDER — SODIUM CHLORIDE (PF) 0.9 % IJ SOLN
INTRAMUSCULAR | Status: AC
  Filled 2017-12-03: qty 50

## 2017-12-03 MED ORDER — SODIUM CHLORIDE 0.9 % IV SOLN
INTRAVENOUS | Status: DC
  Administered 2017-12-03: 12:00:00 via INTRAVENOUS

## 2017-12-03 MED ORDER — PIPERACILLIN-TAZOBACTAM 3.375 G IVPB
3.3750 g | Freq: Three times a day (TID) | INTRAVENOUS | Status: DC
  Administered 2017-12-03 – 2017-12-05 (×6): 3.375 g via INTRAVENOUS
  Filled 2017-12-03 (×7): qty 50

## 2017-12-03 MED ORDER — ENOXAPARIN SODIUM 40 MG/0.4ML ~~LOC~~ SOLN
40.0000 mg | SUBCUTANEOUS | Status: DC
  Filled 2017-12-03 (×3): qty 0.4

## 2017-12-03 MED ORDER — ALBUMIN HUMAN 25 % IV SOLN
INTRAVENOUS | Status: AC
  Administered 2017-12-03: 25 g
  Filled 2017-12-03: qty 100

## 2017-12-03 MED ORDER — NOREPINEPHRINE 4 MG/250ML-% IV SOLN
0.0000 ug/min | INTRAVENOUS | Status: DC
  Administered 2017-12-03: 2 ug/min via INTRAVENOUS
  Administered 2017-12-03: 18 ug/min via INTRAVENOUS
  Administered 2017-12-04: 6 ug/min via INTRAVENOUS
  Administered 2017-12-04: 2 ug/min via INTRAVENOUS
  Filled 2017-12-03 (×4): qty 250

## 2017-12-03 MED ORDER — DEXTROSE 50 % IV SOLN
25.0000 mL | Freq: Once | INTRAVENOUS | Status: AC
  Administered 2017-12-03: 25 mL via INTRAVENOUS

## 2017-12-03 MED ORDER — DEXTROSE-NACL 5-0.9 % IV SOLN
INTRAVENOUS | Status: DC
  Administered 2017-12-03 – 2017-12-06 (×5): via INTRAVENOUS

## 2017-12-03 MED ORDER — INSULIN ASPART 100 UNIT/ML ~~LOC~~ SOLN: 0.0000 [IU] | Freq: Three times a day (TID) | SUBCUTANEOUS | Status: DC

## 2017-12-03 MED ORDER — SENNOSIDES-DOCUSATE SODIUM 8.6-50 MG PO TABS: 1.0000 | ORAL_TABLET | Freq: Every evening | ORAL | Status: DC | PRN

## 2017-12-03 MED ORDER — SODIUM CHLORIDE 0.9 % IV BOLUS
30.0000 mL/kg | Freq: Once | INTRAVENOUS | Status: AC
  Administered 2017-12-03: 1000 mL via INTRAVENOUS

## 2017-12-03 MED ORDER — GABAPENTIN 400 MG PO CAPS
400.0000 mg | ORAL_CAPSULE | Freq: Every day | ORAL | Status: DC
  Administered 2017-12-03 – 2017-12-04 (×2): 400 mg via ORAL
  Filled 2017-12-03 (×2): qty 1

## 2017-12-03 MED ORDER — SODIUM CHLORIDE 0.9 % IV SOLN
Freq: Once | INTRAVENOUS | Status: AC
  Administered 2017-12-03: 14:00:00 via INTRAVENOUS

## 2017-12-03 MED ORDER — ACETAMINOPHEN 325 MG PO TABS
650.0000 mg | ORAL_TABLET | Freq: Four times a day (QID) | ORAL | Status: DC | PRN
  Administered 2017-12-08: 650 mg via ORAL
  Filled 2017-12-03: qty 2

## 2017-12-03 MED ORDER — ACETAMINOPHEN 650 MG RE SUPP: 650.0000 mg | Freq: Four times a day (QID) | RECTAL | Status: DC | PRN

## 2017-12-03 MED ORDER — ONDANSETRON HCL 4 MG/2ML IJ SOLN: 4.0000 mg | Freq: Four times a day (QID) | INTRAMUSCULAR | Status: DC | PRN

## 2017-12-03 MED ORDER — IOHEXOL 300 MG/ML  SOLN
100.0000 mL | Freq: Once | INTRAMUSCULAR | Status: AC | PRN
  Administered 2017-12-03: 100 mL via INTRAVENOUS

## 2017-12-03 MED ORDER — GABAPENTIN 400 MG PO CAPS: 400.0000 mg | ORAL_CAPSULE | Freq: Two times a day (BID) | ORAL | Status: DC

## 2017-12-03 MED ORDER — ZOLPIDEM TARTRATE 5 MG PO TABS
5.0000 mg | ORAL_TABLET | Freq: Every day | ORAL | Status: AC
  Administered 2017-12-03 – 2017-12-05 (×3): 5 mg via ORAL
  Filled 2017-12-03 (×3): qty 1

## 2017-12-03 MED ORDER — OXYCODONE HCL 5 MG PO TABS
10.0000 mg | ORAL_TABLET | ORAL | Status: DC | PRN
  Administered 2017-12-04 – 2017-12-05 (×2): 10 mg via ORAL
  Filled 2017-12-03 (×2): qty 2

## 2017-12-03 MED ORDER — GENTAMICIN SULFATE 40 MG/ML IJ SOLN
7.0000 mg/kg | Freq: Once | INTRAVENOUS | Status: DC
  Filled 2017-12-03: qty 15

## 2017-12-03 MED ORDER — PIPERACILLIN-TAZOBACTAM 3.375 G IVPB 30 MIN: 3.3750 g | Freq: Once | INTRAVENOUS | Status: DC

## 2017-12-03 MED ORDER — LORAZEPAM 2 MG/ML IJ SOLN
INTRAMUSCULAR | Status: AC
  Filled 2017-12-03: qty 1

## 2017-12-03 MED ORDER — PIPERACILLIN-TAZOBACTAM 3.375 G IVPB 30 MIN
3.3750 g | Freq: Once | INTRAVENOUS | Status: AC
  Administered 2017-12-03: 3.375 g via INTRAVENOUS
  Filled 2017-12-03: qty 50

## 2017-12-03 MED ORDER — POLYVINYL ALCOHOL 1.4 % OP SOLN
1.0000 [drp] | Freq: Every day | OPHTHALMIC | Status: DC | PRN
  Filled 2017-12-03: qty 15

## 2017-12-03 MED ORDER — ONDANSETRON HCL 4 MG PO TABS: 4.0000 mg | ORAL_TABLET | Freq: Four times a day (QID) | ORAL | Status: DC | PRN

## 2017-12-03 MED ORDER — SODIUM CHLORIDE 0.9 % IV BOLUS: 1000.0000 mL | Freq: Once | INTRAVENOUS | Status: DC

## 2017-12-03 MED ORDER — BOOST PLUS PO LIQD
237.0000 mL | Freq: Three times a day (TID) | ORAL | Status: DC
  Administered 2017-12-04 – 2017-12-10 (×11): 237 mL via ORAL
  Filled 2017-12-03 (×25): qty 237

## 2017-12-03 MED ORDER — LORAZEPAM 2 MG/ML IJ SOLN
0.5000 mg | Freq: Once | INTRAMUSCULAR | Status: AC
  Administered 2017-12-03: 0.5 mg via INTRAVENOUS

## 2017-12-03 MED ORDER — DEXTROSE 50 % IV SOLN
INTRAVENOUS | Status: AC
  Administered 2017-12-03: 25 mL
  Filled 2017-12-03: qty 50

## 2017-12-03 NOTE — ED Provider Notes (Signed)
[facility] DEPT Provider Note   CSN: [id number] Arrival date & time: 12/03/17  6301     History   Chief Complaint Chief Complaint  Patient presents with  . Code Sepsis    HPI Matthew Lewis is a 62 y.o. male.  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  Patient had percutaneous biliary drainage tube placement on 10\20\19.  Patient reports that the tube drain had gotten clogged yesterday.  He reports it was flushed and some of the material was flushed back into the drain.  He was then seen with complaint of rigors at the cancer center after the procedure.  He was given 1 dose of ceftriaxone and fluids.  Blood cultures and urine cultures were obtained.  His instructions were to return today for additional dose of Rocephin and continued monitoring of his blood cultures.  Today, the patient was tachycardic and hypotensive.  He is referred to the emergency department for sepsis with 1 anaerobic blood culture bottle positive for gram-negative rods.  Patient reports that he is not having pain.  He reports he just feels extremely weak and had a large episode of vomiting today. Past Medical History:  Diagnosis Date  . Abscess of anal and rectal regions    horse shoe abscess  . Alcoholic cirrhosis ([place])   . Arthritis    "everywhere; especially in my spine"  . Cirrhosis ([place])    STAGE 1 CIRRHOSIS PATIENT SEES DR NAMDIGAM FOR  . Colon cancer ([place]) 05/2005   TX SURGERY WITH LYMPH NODE REMOVAL  . Cough    LAST FEW WEEKS SAW DR RAMOS LUNGS MILKY SPUTUM OCC  . DM type 2 with diabetic peripheral neuropathy (HCC)    left foot  . Fatigue   . Fistula, anal    multiple  . History of substance abuse ([place])    last alcohol was 08/2011; last marijuania was 08/2011  . Hypertension   . PTSD (post-traumatic stress disorder)    "severe" HX OF  . Sleep deprivation     Patient  Active Problem List   Diagnosis Date Noted  . Severe sepsis with acute organ dysfunction due to Gram negative bacteria ([place]) 12/03/2017  . Hypotension 11/20/2017  . Acute renal failure (ARF) ([place]) 11/20/2017  . Palliative care by specialist   . Segmental colitis ([place]) 11/09/2017  . Elevated lipase 11/09/2017  . Intrahepatic cholangiocarcinoma ([place]) 11/06/2017  . Goals of care, counseling/discussion 11/06/2017  . Cirrhosis ([place])   . Malignant neoplasm metastatic to adrenal gland ([place])   . Obstructive jaundice 10/23/2017  . Transaminitis 10/23/2017  . Liver lesion 10/23/2017  . History of colon cancer 10/23/2017  . Perirectal abscess 02/24/2017  . Moderate protein-calorie malnutrition ([place]) 02/24/2017  . Sepsis ([place]) 02/24/2017  . Pulmonary nodule 02/24/2017  . Alcoholic cirrhosis of liver without ascites ([place]) 02/24/2017  . Duodenitis 06/13/2016  . Hyponatremia 06/12/2016  . Nausea & vomiting 06/12/2016  . Thrombocytopenia ([place]) 06/12/2016  . Acute gastroenteritis 06/12/2016  . PTSD (post-traumatic stress disorder) 12/02/2012  . Hx of substance abuse- clean several years now 12/02/2012  . Chest pain with moderate risk of acute coronary syndrome 11/30/2012  . Obesity 11/30/2012  . Hypertension 11/30/2012  . Hyperlipemia 11/30/2012  . Diabetes mellitus ([place]) 11/30/2012  . CAD (coronary artery disease)- PCI 2001- no evaluation since 11/30/2012    Past Surgical History:  Procedure Laterality Date  . ABSCESS DRAINAGE     "  probably 15-20 so far at least; rectal"  . ANAL FISTULECTOMY  12/13/1998  . ANTERIOR FUSION CERVICAL SPINE  2010   "triple"; "did 2 surgeries on the same day in 5361 due to complications"  . CORONARY ANGIOPLASTY  2001  . detached muscle  2010   right chest; "after cervical fusion complications"  . ELBOW SURGERY  2007   "cut out part of a muscle"; left  . EXAMINATION UNDER ANESTHESIA  03/22/2005   fistula  . EXAMINATION UNDER ANESTHESIA  06/06/2011    Procedure: EXAM UNDER ANESTHESIA;  Surgeon: Stark Klein, MD;  Location: [facility];  Service: General;  Laterality: N/A;  . HEMICOLECTOMY  06/06/2005   left  . INCISION AND DRAINAGE PERIRECTAL ABSCESS  03/30/2005  . INCISION AND DRAINAGE PERIRECTAL ABSCESS  12/13/2004  . INCISION AND DRAINAGE PERIRECTAL ABSCESS  07/14/2001  . INCISION AND DRAINAGE PERIRECTAL ABSCESS  08/12/2010   horseshoe abscess; Dr Redmond Pulling  . INCISION AND DRAINAGE PERIRECTAL ABSCESS  06/06/2011   Procedure: IRRIGATION AND DEBRIDEMENT PERIRECTAL ABSCESS;  Surgeon: Stark Klein, MD;  Location: [facility];  Service: General;  Laterality: N/A;  . INCISION AND DRAINAGE PERIRECTAL ABSCESS N/A 02/24/2017   Procedure: IRRIGATION AND DEBRIDEMENT PERIRECTAL ABSCESS;  Surgeon: Ralene Ok, MD;  Location: [facility];  Service: General;  Laterality: N/A;  . IR EXCHANGE BILIARY DRAIN  11/07/2017  . IR INT EXT BILIARY DRAIN WITH CHOLANGIOGRAM  10/26/2017  . IR PATIENT EVAL TECH 0-60 MINS  12/02/2017  . IR RADIOLOGIST EVAL & MGMT  11/06/2017  . [place]  . PERCUTANEOUS PINNING PHALANX FRACTURE OF HAND  ~ 2008   "plates in 2 places"; right  . PLACEMENT OF SETON N/A 09/05/2017   Procedure: PLACEMENT OF SETON;  Surgeon: Leighton Ruff, MD;  Location: [facility];  Service: General;  Laterality: N/A;  . RADIOLOGY WITH ANESTHESIA N/A 10/25/2017   Procedure: MRI WITH ANESTHESIA;  Surgeon: Radiologist, Medication, MD;  Location: [facility];  Service: Radiology;  Laterality: N/A;  . THORACIC DISCECTOMY  1990's        Home Medications    Prior to Admission medications   Medication Sig Start Date End Date Taking? Authorizing Provider  aspirin EC 81 MG tablet Take 81 mg by mouth daily.    [provider]  atorvastatin (LIPITOR) 40 MG tablet Take 1 tablet (40 mg total) by mouth daily at 6 PM. Patient taking differently: Take 20 mg by mouth daily at 6 PM.  12/02/12   Viyuoh, Adeline C, MD  calcium carbonate (TUMS - DOSED IN  MG ELEMENTAL CALCIUM) 500 MG chewable tablet Chew 1 tablet by mouth 2 (two) times daily as needed for indigestion or heartburn.    [provider]  celecoxib (CELEBREX) 200 MG capsule Take 1 capsule by mouth at bedtime.  10/10/17   [provider]  empagliflozin (JARDIANCE) 10 MG TABS tablet Take 10 mg by mouth at bedtime.     [provider]  Fexofenadine-Pseudoephedrine (ALLEGRA-D 24 HOUR PO) Take 1 tablet by mouth every evening. WAL/FEX     [provider]  gabapentin (NEURONTIN) 400 MG capsule Take 400-800 mg by mouth 2 (two) times daily. 1 capsule am, 2 capsules pm    [provider]  glipiZIDE (GLUCOTROL) 5 MG tablet Take 5 mg by mouth 2 (two) times daily before a meal.    [provider]  Insulin Detemir (LEVEMIR FLEXPEN) 100 UNIT/ML Pen Inject 10-60 Units into the skin 2 (two) times  daily. Take 62 units in the am and Take 10 units in the pm    [provider]  lisinopril (PRINIVIL,ZESTRIL) 20 MG tablet Take 10 mg by mouth daily.     [provider]  metFORMIN (GLUCOPHAGE) 1000 MG tablet Take 1,000 mg by mouth 2 (two) times daily with a meal.    [provider]  Multiple Vitamin (MULTIVITAMIN) tablet Take 1 tablet by mouth daily.    [provider]  ondansetron (ZOFRAN) 8 MG tablet Take 1 tablet (8 mg total) by mouth 2 (two) times daily as needed. Start on the third day after chemotherapy. 11/06/17   [name], MD  oxyCODONE 10 MG TABS Take 1 tablet (10 mg total) by mouth every 4 (four) hours as needed for severe pain. 10/29/17   Nita Sells, MD  polyvinyl alcohol (LIQUIFILM TEARS) 1.4 % ophthalmic solution Place 1 drop into both eyes daily as needed for dry eyes.     [provider]  prochlorperazine (COMPAZINE) 10 MG tablet Take 1 tablet (10 mg total) by mouth every 6 (six) hours as needed (Nausea or vomiting). 11/06/17   [name], MD    Family History Family History  Problem  Relation Age of Onset  . Heart disease Father   . Heart disease Mother   . Cancer Mother   . Cancer Sister   . Heart disease Brother     Social History Social History   Tobacco Use  . Smoking status: Former Smoker    Packs/day: 1.00    Years: 10.00    Pack years: 10.00    Types: Cigarettes    Last attempt to quit: 01/08/1991    Years since quitting: 26.9  . Smokeless tobacco: Never Used  Substance Use Topics  . Alcohol use: Yes    Comment: 06/05/11 "drank enough when I was younger to last me my whole life; don't remember when I had my last drink, maybe 2012"  . Drug use: Not Currently    Types: Oxycodone, Marijuana    Comment: 06/05/11 "pot head in my younger days; 2009 dr had me on oxycodone and valium for almost 64yr     Allergies   Morphine and related and Other   Review of Systems Review of Systems 10 Systems reviewed and are negative for acute change except as noted in the HPI.   Physical Exam Updated Vital Signs BP (!) 92/57   Pulse (!) 115   Temp 99.3 F (37.4 C) (Oral)   Resp (!) 25   Ht 5' 11"  (1.803 m)   Wt 86 kg   SpO2 99%   BMI 26.46 kg/m   Physical Exam  Constitutional: He is oriented to person, place, and time.  Patient is alert and appropriate.  He does not have respiratory distress at rest.  He does appear very generally ill and fatigued.  HENT:  Head: Normocephalic and atraumatic.  Mucous membranes slightly dry.  Eyes: EOM are normal. Scleral icterus is present.  Neck: Neck supple.  Cardiovascular:  Tachycardia.  No appreciable rub murmur gallop.  Pulmonary/Chest: Effort normal and breath sounds normal.  Abdominal:  Patient has a biliary drain on the right side of the abdomen.  Yellow material draining.  No erythema.  Abdomen is soft and without guarding.  Patient denies any significant pain to palpation.  Musculoskeletal: Normal range of motion. He exhibits no edema or tenderness.  Neurological: He is alert and oriented to person, place,  and time. No cranial nerve deficit.  He exhibits normal muscle tone. Coordination normal.  Skin: Skin is warm and dry. There is pallor.     ED Treatments / Results  Labs (all labs ordered are listed, but only abnormal results are displayed) Labs Reviewed  COMPREHENSIVE METABOLIC PANEL - Abnormal; Notable for the following components:      Result Value   Sodium 128 (*)    CO2 16 (*)    BUN 25 (*)    Total Protein 8.4 (*)    Albumin 3.2 (*)    AST 71 (*)    ALT 51 (*)    Alkaline Phosphatase 368 (*)    Total Bilirubin 4.4 (*)    All other components within normal limits  CBC WITH DIFFERENTIAL/PLATELET - Abnormal; Notable for the following components:   RBC 3.48 (*)    Hemoglobin 10.4 (*)    HCT 33.2 (*)    RDW 16.3 (*)    Platelets 146 (*)    Lymphs Abs 0.2 (*)    All other components within normal limits  PROTIME-INR - Abnormal; Notable for the following components:   Prothrombin Time 18.0 (*)    All other components within normal limits  I-STAT CG4 LACTIC ACID, ED - Abnormal; Notable for the following components:   Lactic Acid, Venous 5.83 (*)    All other components within normal limits  CULTURE, BLOOD (ROUTINE X 2)  CULTURE, BLOOD (ROUTINE X 2)    EKG None  Radiology Dg Chest 2 View  Result Date: 12/03/2017 CLINICAL DATA:  Fever.  Sepsis evaluation. EXAM: CHEST - 2 VIEW COMPARISON:  11/20/2017 FINDINGS: Patchy airspace disease noted posteriorly on the lateral at the lung base. I suspect this is likely within the right lower lobe and concerning for pneumonia. Heart is normal size. No effusions. No acute bony abnormality. IMPRESSION: Patchy posterior basilar opacity best seen on the lateral view, likely in the right lung base concerning for right lower lobe pneumonia. Electronically Signed   By: Rolm Baptise M.D.   On: 12/03/2017 10:23   Ir Patient Eval Tech 0-60 Mins  Result Date: 12/02/2017 [place]     12/02/2017 12:46 PM Patient came in today with complaint  of biliary tube not draining.  I evaluated the tube.  It flushed easily.  The patient stated that he was unable to aspirate bile.  I placed a new drain bag to gravity and got minimal bloody bile bile draining into the bag.  I discussed this with Dr Barbie Banner.  I explained to the patient that he had an internal/ external biliary drain catheter so it was draining internal which is the desired goal. .  It flushed easily and the statlock and sutures were intact at the original position.  No images were requested.  The patient was advised to call us if any further problems arise.  Otherwise , he will return for a routine exchange at his scheduled interval.       Procedures Procedures (including critical care time) CRITICAL CARE Performed by: Charlesetta Shanks   Total critical care time:45 minutes  Critical care time was exclusive of separately billable procedures and treating other patients.  Critical care was necessary to treat or prevent imminent or life-threatening deterioration.  Critical care was time spent personally by me on the following activities: development of treatment plan with patient and/or surrogate as well as nursing, discussions with consultants, evaluation of patient's response to treatment, examination of patient, obtaining history from patient or surrogate, ordering and performing treatments  and interventions, ordering and review of laboratory studies, ordering and review of radiographic studies, pulse oximetry and re-evaluation of patient's condition. Medications Ordered in ED Medications  sodium chloride 0.9 % bolus 1,000 mL (has no administration in time range)  sodium chloride 0.9 % bolus 2,580 mL (1,000 mLs Intravenous New Bag/Given 12/03/17 1003)  piperacillin-tazobactam (ZOSYN) IVPB 3.375 g (3.375 g Intravenous New Bag/Given 12/03/17 1039)     Initial Impression / Assessment and Plan / ED Course  I have reviewed the triage vital signs and the nursing notes.  Pertinent labs  & imaging results that were available during my care of the patient were reviewed by me and considered in my medical decision making (see chart for details).  Clinical Course as of Dec 03 1101  Wed Dec 03, 2017  1011 Ordered consults to ID and Hospitalist   [MP]  1018 Consult: Reviewed with Dr. Drucilla Schmidt.  We reviewed results of yesterday's 24-hour culture.  Advises to continue with Zosyn and discontinue plan for gentamicin.   [MP]    Clinical Course User Index [MP] Charlesetta Shanks, MD   Patient presents as outlined.  He has positive gram-negative blood culture and anaerobic bottle.  This was reviewed with Dr. Drucilla Schmidt.  Patient has been started on Zosyn and fluid resuscitation.  Mental status and respiratory status are stable at this time.  Patient does appear critically ill with tachycardia, lactic acidosis and hypotension.  Patient is admitted to hospitalist service.  Will continue to monitor for response to fluid resuscitation and antibiotics.  Final Clinical Impressions(s) / ED Diagnoses   Final diagnoses:  Septic shock ([place])  Cholangitis  Severe comorbid illness    ED Discharge Orders    None       Charlesetta Shanks, MD 12/03/17 1104

## 2017-12-03 NOTE — ED Notes (Signed)
Coming from cancer center-had drain flushed in IR yesterday-history of Gallbladder cancer-running a fever

## 2017-12-03 NOTE — ED Triage Notes (Signed)
Pt brought from cancer center.  PA from cancer center reported pt was seen yesterday for biliary duct drainage. After procedure, blood cultures were collected and pt was given 2g IV rocephin.  Blood cultures came back positive for Gram negative rods.    Temp recorded at cancer center 100.7, pt reported taking tylenol this morning a little after 0800, ("2 pills, arthritis strength").

## 2017-12-03 NOTE — Progress Notes (Signed)
[facility] Progress Note   Matthew Lewis [id number] May 03, 1955 62 y.o.  [name] is managed by Dr. [name]  Actively treated with chemotherapy/immunotherapy/hormonal therapy: no   Assessment: Plan:    Sepsis, due to unspecified organism, unspecified whether acute organ dysfunction present ([place])  Dehydration  Positive blood cultures  Intrahepatic cholangiocarcinoma ([place])   Sepsis with blood cultures positive for gram-negative rods: The patient was taken emergently to the ER for evaluation and management.  Intrahepatic cholangiocarcinoma: Mr. Sistrunk continues to await the start of chemotherapy.  He initially was scheduled to begin chemotherapy today however treatment will be delayed until his sepsis can be managed.    Dr. [name] will discuss the patient's case with surgery to inquire if his biliary drain can be internalized.  Please see After Visit Summary for patient specific instructions.  Future Appointments  Date Time Provider [place]  12/09/2017  8:00 AM [facility] ROOM [facility] None  12/09/2017  9:30 AM [facility] 1 [facility] [place]  12/11/2017  8:00 AM [facility] LAB 5 [facility] None  12/11/2017  8:15 AM [facility] INFUSION [facility] None    No orders of the defined types were placed in this encounter.      Subjective:   Patient ID:  Matthew Lewis is a 62 y.o. (DOB 1955/09/01) male.  Chief Complaint: No chief complaint on file.   HPI [name]  is a 62 year old male with an intrahepatic cholangiocarcinoma who is managed by Dr. [name].  He was seen yesterday in interventional radiology at which time his biliary drain was flushed.  He reported that IR had difficulty flushing the drain. They had to apply  significant pressure to flush the drain.  He had pain in his abdomen when the drain was flushed.  He went home after procedure and developed rigors. His CBC from yesterday again showed a WBC at 14.2 with an Abie of  13.4.  He has a history of a Klebsiella infection.  He had blood cultures collected yesterday along with a urine and urine culture.  He was given Rocephin 2 g IV x1.  He was afebrile though slightly hypotensive.  He was to return today for an additional 2 g of Rocephin and for additional IV hydration.  He presented to the office today with rigors.  He came with his wife who stated that his temperature had been 100.7 last evening.  She gave him Tylenol.  His temperature was taken in clinic after he had had a drink of water.  It returned at 99.6.  He had also had a Tylenol earlier.  Blood cultures from yesterday returned with one set showing gram-negative rods.  Mr. Bart was immediately taken to the emergency room for management.  Medications: I have reviewed the patient's current medications.  Allergies:  Allergies  Allergen Reactions  . Morphine And Related Other (See Comments)    Patient is recovering from drug addiction and wants to avoid any narcotics    Past Medical History:  Diagnosis Date  . Abscess of anal and rectal regions    horse shoe abscess  . Alcoholic cirrhosis ([place])   . Arthritis    "everywhere; especially in my spine"  . Cirrhosis ([place])    STAGE 1 CIRRHOSIS PATIENT SEES DR NAMDIGAM FOR  . Colon cancer ([place]) 05/2005   TX SURGERY WITH LYMPH NODE REMOVAL  . Cough    LAST FEW WEEKS SAW DR RAMOS LUNGS MILKY SPUTUM OCC  . DM type  2 with diabetic peripheral neuropathy ([facility])    left foot  . Fatigue   . Fistula, anal    multiple  . History of substance abuse ([place])    last alcohol was 08/2011; last marijuania was 08/2011  . Hypertension   . PTSD (post-traumatic stress disorder)    "severe" HX OF  . Sleep deprivation     Past Surgical History:  Procedure Laterality Date  . ABSCESS DRAINAGE     "probably 15-20 so far at least; rectal"  . ANAL FISTULECTOMY  12/13/1998  . ANTERIOR FUSION CERVICAL SPINE  2010   "triple"; "did 2 surgeries on the same day in 3295 due to  complications"  . CORONARY ANGIOPLASTY  2001  . detached muscle  2010   right chest; "after cervical fusion complications"  . ELBOW SURGERY  2007   "cut out part of a muscle"; left  . EXAMINATION UNDER ANESTHESIA  03/22/2005   fistula  . EXAMINATION UNDER ANESTHESIA  06/06/2011   Procedure: EXAM UNDER ANESTHESIA;  Surgeon: Stark Klein, MD;  Location: [place];  Service: General;  Laterality: N/A;  . HEMICOLECTOMY  06/06/2005   left  . INCISION AND DRAINAGE PERIRECTAL ABSCESS  03/30/2005  . INCISION AND DRAINAGE PERIRECTAL ABSCESS  12/13/2004  . INCISION AND DRAINAGE PERIRECTAL ABSCESS  07/14/2001  . INCISION AND DRAINAGE PERIRECTAL ABSCESS  08/12/2010   horseshoe abscess; Dr Redmond Pulling  . INCISION AND DRAINAGE PERIRECTAL ABSCESS  06/06/2011   Procedure: IRRIGATION AND DEBRIDEMENT PERIRECTAL ABSCESS;  Surgeon: Stark Klein, MD;  Location: [place];  Service: General;  Laterality: N/A;  . INCISION AND DRAINAGE PERIRECTAL ABSCESS N/A 02/24/2017   Procedure: IRRIGATION AND DEBRIDEMENT PERIRECTAL ABSCESS;  Surgeon: Ralene Ok, MD;  Location: [place];  Service: General;  Laterality: N/A;  . IR EXCHANGE BILIARY DRAIN  11/07/2017  . IR INT EXT BILIARY DRAIN WITH CHOLANGIOGRAM  10/26/2017  . IR PATIENT EVAL TECH 0-60 MINS  12/02/2017  . IR RADIOLOGIST EVAL & MGMT  11/06/2017  . [place]  . PERCUTANEOUS PINNING PHALANX FRACTURE OF HAND  ~ 2008   "plates in 2 places"; right  . PLACEMENT OF SETON N/A 09/05/2017   Procedure: PLACEMENT OF SETON;  Surgeon: Leighton Ruff, MD;  Location: [facility];  Service: General;  Laterality: N/A;  . RADIOLOGY WITH ANESTHESIA N/A 10/25/2017   Procedure: MRI WITH ANESTHESIA;  Surgeon: Radiologist, Medication, MD;  Location: [place];  Service: Radiology;  Laterality: N/A;  . THORACIC DISCECTOMY  1990's    Family History  Problem Relation Age of Onset  . Heart disease Father   . Heart disease Mother   . Cancer Mother   . Cancer Sister   .  Heart disease Brother     Social History   Socioeconomic History  . Marital status: Married    Spouse name: Not on file  . Number of children: Not on file  . Years of education: Not on file  . Highest education level: Not on file  Occupational History  . Occupation: works at [employer]  . Financial resource strain: Not on file  . Food insecurity:    Worry: Not on file    Inability: Not on file  . Transportation needs:    Medical: Not on file    Non-medical: Not on file  Tobacco Use  . Smoking status: Former Smoker    Packs/day: 1.00    Years: 10.00    Pack years: 10.00  Types: Cigarettes    Last attempt to quit: 01/08/1991    Years since quitting: 26.9  . Smokeless tobacco: Never Used  Substance and Sexual Activity  . Alcohol use: Yes    Comment: 06/05/11 "drank enough when I was younger to last me my whole life; don't remember when I had my last drink, maybe 2012"  . Drug use: Not Currently    Types: Oxycodone, Marijuana    Comment: 06/05/11 "pot head in my younger days; 2009 dr had me on oxycodone and valium for almost 60yr  . Sexual activity: Not Currently  Lifestyle  . Physical activity:    Days per week: Not on file    Minutes per session: Not on file  . Stress: To some extent  Relationships  . Social connections:    Talks on phone: Not on file    Gets together: Not on file    Attends religious service: Not on file    Active member of club or organization: Not on file    Attends meetings of clubs or organizations: Not on file    Relationship status: Not on file  . Intimate partner violence:    Fear of current or ex partner: Not on file    Emotionally abused: Not on file    Physically abused: Not on file    Forced sexual activity: Not on file  Other Topics Concern  . Not on file  Social History Narrative  . Not on file    Past Medical History, Surgical history, Social history, and Family history were reviewed and updated as  appropriate.   Please see review of systems for further details on the patient's review from today.   Review of Systems:  Review of Systems  Constitutional: Positive for chills and fever. Negative for diaphoresis.  HENT: Negative for trouble swallowing and voice change.   Respiratory: Negative for cough, chest tightness, shortness of breath and wheezing.   Cardiovascular: Negative for chest pain and palpitations.  Gastrointestinal: Positive for abdominal distention, abdominal pain, nausea and vomiting. Negative for constipation and diarrhea.  Musculoskeletal: Negative for back pain and myalgias.  Skin: Positive for color change (Jaundice).  Neurological: Negative for dizziness, light-headedness and headaches.    Objective:   Physical Exam:  BP 130/78 Comment: pt shaking  Pulse (!) 145   Temp 99.7 F (37.6 C) (Oral)   Resp (!) 30   SpO2 100%  ECOG: 2  Physical Exam  Constitutional: No distress.  HENT:  Head: Normocephalic and atraumatic.  Eyes: Scleral icterus is present.  Cardiovascular: Regular rhythm and normal heart sounds. Tachycardia present. Exam reveals no gallop and no friction rub.  No murmur heard. Pulmonary/Chest: Effort normal and breath sounds normal. Tachypnea noted. He has no wheezes. He has no rales.  Abdominal:    Neurological: He is alert.  Skin: Skin is warm and dry. No rash noted. No erythema.  The patient is jaundice    Lab Review:     Component Value Date/Time   NA 128 (L) 12/03/2017 0953   K 4.1 12/03/2017 0953   CL 98 12/03/2017 0953   CO2 16 (L) 12/03/2017 0953   GLUCOSE 95 12/03/2017 0953   BUN 25 (H) 12/03/2017 0953   CREATININE 1.05 12/03/2017 1206   CREATININE 0.87 12/01/2017 1529   CALCIUM 9.1 12/03/2017 0953   PROT 8.4 (H) 12/03/2017 0953   ALBUMIN 3.2 (L) 12/03/2017 0953   AST 71 (H) 12/03/2017 0953   AST 54 (H) 12/01/2017 1529  ALT 51 (H) 12/03/2017 0953   ALT 46 (H) 12/01/2017 1529   ALKPHOS 368 (H) 12/03/2017 0953    BILITOT 4.4 (H) 12/03/2017 0953   BILITOT 1.8 (H) 12/01/2017 1529   GFRNONAA >60 12/03/2017 1206   GFRNONAA >60 12/01/2017 1529   GFRAA >60 12/03/2017 1206   GFRAA >60 12/01/2017 1529       Component Value Date/Time   WBC 7.8 12/03/2017 0953   RBC 3.48 (L) 12/03/2017 0953   HGB 10.4 (L) 12/03/2017 0953   HGB 11.8 (L) 12/02/2017 1252   HGB 15.2 07/18/2006 1420   HCT 33.2 (L) 12/03/2017 0953   HCT 42.8 07/18/2006 1420   PLT 146 (L) 12/03/2017 0953   PLT 248 12/02/2017 1252   PLT 193 07/18/2006 1420   MCV 95.4 12/03/2017 0953   MCV 93.3 07/18/2006 1420   MCH 29.9 12/03/2017 0953   MCHC 31.3 12/03/2017 0953   RDW 16.3 (H) 12/03/2017 0953   RDW 13.5 07/18/2006 1420   LYMPHSABS 0.2 (L) 12/03/2017 0953   LYMPHSABS 3.0 07/18/2006 1420   MONOABS 0.1 12/03/2017 0953   MONOABS 0.7 07/18/2006 1420   EOSABS 0.0 12/03/2017 0953   EOSABS 0.3 07/18/2006 1420   BASOSABS 0.0 12/03/2017 0953   BASOSABS 0.1 07/18/2006 1420   -------------------------------  Imaging from last 24 hours (if applicable):  Radiology interpretation: Dg Chest 2 View  Result Date: 12/03/2017 CLINICAL DATA:  Fever.  Sepsis evaluation. EXAM: CHEST - 2 VIEW COMPARISON:  11/20/2017 FINDINGS: Patchy airspace disease noted posteriorly on the lateral at the lung base. I suspect this is likely within the right lower lobe and concerning for pneumonia. Heart is normal size. No effusions. No acute bony abnormality. IMPRESSION: Patchy posterior basilar opacity best seen on the lateral view, likely in the right lung base concerning for right lower lobe pneumonia. Electronically Signed   By: Rolm Baptise M.D.   On: 12/03/2017 10:23   Dg Chest 2 View  Result Date: 11/08/2017 CLINICAL DATA:  63 year old male with sepsis. EXAM: CHEST - 2 VIEW COMPARISON:  Chest CT dated 10/27/2017 FINDINGS: There is cardiomegaly with probable mild vascular congestion. No focal consolidation or pneumothorax. Bilateral lung consolidation/masses seen  on the prior CT are suboptimally visualized on this radiograph. Possible trace right pleural effusion. No acute osseous pathology. A pigtail tube is noted in the upper abdomen likely corresponding to the percutaneous biliary drain catheter. IMPRESSION: 1. Cardiomegaly with probable mild vascular congestion. 2. Suboptimal visualization of the pulmonary masses/nodules seen on the prior CT. 3. Probable small right pleural effusion. Electronically Signed   By: Anner Crete M.D.   On: 11/08/2017 21:22   Ct Abdomen Pelvis W Contrast  Result Date: 11/08/2017 CLINICAL DATA:  61 year old male with history of lung cancer presenting with generalized weakness and jaundice. EXAM: CT ABDOMEN AND PELVIS WITH CONTRAST TECHNIQUE: Multidetector CT imaging of the abdomen and pelvis was performed using the standard protocol following bolus administration of intravenous contrast. CONTRAST:  137m ISOVUE-300 IOPAMIDOL (ISOVUE-300) INJECTION 61% COMPARISON:  CT of the abdomen pelvis dated 11/06/2016 FINDINGS: Lower chest: Partially visualized small right pleural effusion with associated compressive atelectasis of the right lower lobe. A partially visualized 5.1 x 3.8 cm right lung base mass and a 1.8 x 2.3 cm left lung base nodule. There is coronary vascular calcification. Small pocket of pneumoperitoneum along the lateral aspect of the liver related to biliary drain catheter placement. There is a small ascites. Hepatobiliary: Morphologic changes of cirrhosis. Multiple hepatic hypodense lesions as  seen previously most consistent with metastatic disease. Ill-defined 2.1 x 5.1 cm hypodense area in the left lobe of the liver as seen previously appears to communicate with the left portal vein and concerning for a tumor thrombus or a bland thrombus within the left portal vein. A primary biliary neoplasm such as cholangiocarcinoma or metastatic disease are not excluded. There is layering sludge or small stones within the gallbladder. A  percutaneous biliary drain with pigtail tip noted in similar position. No significant interval change in the dilatation of the intrahepatic biliary trees compared to the most recent prior CT. Pancreas: Unremarkable. No pancreatic ductal dilatation or surrounding inflammatory changes. Spleen: Normal in size without focal abnormality. Adrenals/Urinary Tract: Bilateral adrenal masses most consistent with metastatic disease. There is no hydronephrosis on either side. Punctate nonobstructing left renal inferior pole calculus. The visualized ureters and urinary bladder appear unremarkable. Stomach/Bowel: Surgical material noted in the region of the rectum similar to prior CT. Postsurgical changes and suture in the descending colon. There is no bowel obstruction. Mild thickened appearance of the hepatic flexure, likely reactive to cirrhosis and ascites/hepatic colopathy. Colitis is less likely. Clinical correlation is recommended. The appendix is normal. Vascular/Lymphatic: Mild atherosclerotic calcification of the aorta. No portal venous gas. Retroperitoneal adenopathy. A 3.6 x 2.2 cm nodular density with surrounding there is no plastic reaction in the left lower abdomen (series 2, image 65) most consistent with mesenteric or serosal metastatic disease/implant. Reproductive: The prostate and seminal vesicles are grossly unremarkable. Other: Mild diffuse subcutaneous edema. Musculoskeletal: Degenerative changes of the spine. Bilateral L5 pars defects with grade 1 L5-S1 anterolisthesis. No acute osseous pathology. IMPRESSION: 1. Overall no significant interval change since the CT of 11/06/2017. Stable positioning of the percutaneous biliary drain with similar degree of biliary ductal dilatation. 2. Cirrhosis with multiple hepatic metastatic disease. Infiltrative appearing lesion in the left lobe of the liver appears to extend into the left portal vein and may represent a tumor thrombus, or bland thrombus. A primary biliary  neoplasm (cholangiocarcinoma, or metastatic disease are other considerations. 3. Retroperitoneal, mesenteric, and adrenal metastatic disease. 4. Hepatic colopathy versus less likely segmental colitis of the hepatic flexure. 5. Small right pleural effusion and bilateral lung base pulmonary masses/nodules as seen previously. 6. Tiny pockets of pneumoperitoneum lateral to the liver along the percutaneous drain, decreased since the prior CT. 7. Other findings as above. Electronically Signed   By: Anner Crete M.D.   On: 11/08/2017 22:50   Ct Abdomen Pelvis W Contrast  Result Date: 11/06/2017 CLINICAL DATA:  Infiltrative lesion within the liver. Suspicion of cholangiocarcinoma. Cirrhotic liver. EXAM: CT ABDOMEN AND PELVIS WITH CONTRAST TECHNIQUE: Multidetector CT imaging of the abdomen and pelvis was performed using the standard protocol following bolus administration of intravenous contrast. CONTRAST:  141m ISOVUE-300 IOPAMIDOL (ISOVUE-300) INJECTION 61% COMPARISON:  CT 10/27/2017, 02/24/2017, MRI 10/25/2017 FINDINGS: Lower chest: Partially loculated fluid at the RIGHT lung base is increased compared to prior. Basilar atelectasis. There is masslike consolidation in the RIGHT lower lobe measuring 3.7 cm unchanged. In LEFT lower lobe there is nodular metastasis measuring 2.6 cm not significant changed. Hepatobiliary: Percutaneous drain extends the porta hepatis from a RIGHT flank approach. Small pneumothorax at the RIGHT lateral lung base (image 27/4) associated with the chest tube. Centrally within liver, this ill-defined mass in central LEFT hepatic lobe measuring approximately 5.1 by 5.1 cm. There is biliary obstruction of the LEFT hepatic lobe. There are 2 round discrete lesion in the RIGHT hepatic lobe. One the  subcapsular anterior RIGHT hepatic lobe measuring 2.3 cm. One in the inferior RIGHT hepatic lobe measuring 1.8 cm Gallbladder is normal. Common bile duct normal. Liver has a nodular contour with  enlargement of the caudate lobe Pancreas: Normal pancreatic parenchymal intensity. No ductal dilatation or inflammation. Spleen: Normal spleen. Adrenals/urinary tract: Adrenal glands demonstrate bilateral metastatic lesions not changed. Kidneys, ureters and bladder normal Stomach/Bowel: Stomach, small-bowel normal. There is anastomosis along the descending colon. No obstruction. Rectum normal. Vascular/Lymphatic: Abdominal aorta normal caliber. No retroperitoneal periportal adenopathy. Metastatic lymph node positioned between the IVC in the aorta measures 16 mm short axis not changed from prior. No pelvic lymphadenopathy. No inguinal adenopathy. Reproductive: Prostate normal Musculoskeletal: No aggressive osseous lesion. IMPRESSION: 1. Increase in RIGHT pleural effusion. Stable masslike consolidation at the RIGHT lung base representing neoplasm versus pneumonia. 2. Stable metastatic nodule at the RIGHT lung base. 3. Infiltrative mass in the central LEFT hepatic lobe and discrete lesions in the RIGHT hepatic lobe consistent with metastatic disease versus primary hepatic biliary carcinoma. Favor cholangiocarcinoma. 4. Percutaneous drainage catheter extends the porta hepatis. Small trapped pneumothorax along the tract of the catheter at the inferolateral RIGHT lung base. 5. Stable adrenal metastasis. 6. Metastatic adenopathy in the retroperitoneum unchanged. 7. Morphologic changes in liver consistent with  cirrhosis. Electronically Signed   By: Suzy Bouchard M.D.   On: 11/06/2017 17:04   Dg Sinus/fist Tube Chk-non Gi  Result Date: 11/06/2017 INDICATION: Internal external biliary drain, decreased output EXAM: FLUOROSCOPIC INJECTION OF THE RIGHT INTERNAL EXTERNAL BILIARY DRAIN MEDICATIONS: NONE. ANESTHESIA/SEDATION: NONE. COMPLICATIONS: None immediate. PROCEDURE: Under sterile conditions, the existing right internal external biliary drain was injected with fluoroscopic imaging performed. This demonstrates  accumulation of contrast at the right liver edge. Drain has significantly retracted with the catheter tip positioned in the proximal CBD. Distal aspect of the biliary drain does not opacify. Therefore, the tube may also be occluded. There is little bile output from the catheter. IMPRESSION: Significant retraction of the internal external biliary drain now with the tip position in the proximal CBD. Contrast injection accumulates at the liver margin where exposed sideholes are likely extrahepatic. Biliary system is not opacified. PLAN: Schedule for internal external biliary exchange and reposition tomorrow. Electronically Signed   By: Jerilynn Mages.  Shick M.D.   On: 11/06/2017 13:49   Dg Chest Port 1 View  Result Date: 12/03/2017 CLINICAL DATA:  Encounter for central line placement. EXAM: PORTABLE CHEST 1 VIEW 2:55 p.m. COMPARISON:  Chest x-ray dated 12/03/2017 at 10:16 a.m. FINDINGS: New right jugular vein catheter tip is at the cavoatrial junction 4.8 cm below the carina. No pneumothorax. Heart size and vascularity are normal. 2.5 cm mass at the left lung base posteriorly is barely visible on this radiograph. The ill-defined mass at the right lung base is poorly seen on this radiograph. IMPRESSION: Central venous catheter appears in good position. No acute abnormality. Faintly visible masses at both lung bases as previously demonstrated. Electronically Signed   By: Lorriane Shire M.D.   On: 12/03/2017 15:20   Dg Chest Port 1 View  Result Date: 11/20/2017 CLINICAL DATA:  62 y/o M; metastatic right adrenal gland cancer. Weakness with onset yesterday and fall. EXAM: PORTABLE CHEST 1 VIEW COMPARISON:  11/08/2017 chest radiograph. FINDINGS: The heart size and mediastinal contours are within normal limits. Both lungs are clear. ACDF hardware partially visualized. No acute osseous abnormality is evident. IMPRESSION: No active disease. Electronically Signed   By: Kristine Garbe M.D.   On: 11/20/2017 16:00  Ir  Patient Eval Tech 0-60 Mins  Result Date: 12/02/2017 Chipper [place]     12/02/2017 12:46 PM Patient came in today with complaint of biliary tube not draining.  I evaluated the tube.  It flushed easily.  The patient stated that he was unable to aspirate bile.  I placed a new drain bag to gravity and got minimal bloody bile bile draining into the bag.  I discussed this with Dr Barbie Banner.  I explained to the patient that he had an internal/ external biliary drain catheter so it was draining internal which is the desired goal. .  It flushed easily and the statlock and sutures were intact at the original position.  No images were requested.  The patient was advised to call us if any further problems arise.  Otherwise , he will return for a routine exchange at his scheduled interval.      Ir Exchange Biliary Drain  Result Date: 11/08/2017 INDICATION: 61 year old male with a history of previous percutaneous placement of internal external biliary drain. Drain has become withdrawn and he presents for replacement. EXAM: IMAGE GUIDED REPLACEMENT OF INTERNAL/EXTERNAL BILIARY DRAIN. MEDICATIONS: None ANESTHESIA/SEDATION: Moderate (conscious) sedation was employed during this procedure. A total of Versed 1.0 mg and Fentanyl 25 mcg was administered intravenously. Moderate Sedation Time: 10 minutes. The patient's level of consciousness and vital signs were monitored continuously by radiology nursing throughout the procedure under my direct supervision. FLUOROSCOPY TIME:  Fluoroscopy Time: 1 minutes 54 seconds (121 mGy). COMPLICATIONS: None PROCEDURE: Informed written consent was obtained from the patient after a thorough discussion of the procedural risks, benefits and alternatives. All questions were addressed. Maximal Sterile Barrier Technique was utilized including caps, mask, sterile gowns, sterile gloves, sterile drape, hand hygiene and skin antiseptic. A timeout was performed prior to the initiation of the procedure.  Patient positioned supine position on the II table. Images were stored sent to PACs. Patient is prepped and draped in the usual sterile fashion including the indwelling drain. Gentle injection of contrast confirmed the location of the existing drain. Using modified Seldinger technique, the internal external biliary drain was exchanged for a new 10 [place] drain, with the loop curled within the duodenum. Final images were stored confirming flow antegrade. Catheter was sutured in position attached to gravity drainage. Patient tolerated the procedure well and remained hemodynamically stable throughout. No complications were encountered and no significant blood loss. IMPRESSION: Status post exchange of displaced internal/external biliary drain with placement of a new 10 French tube. Signed, Dulcy Fanny. Dellia Nims, RPVI Vascular and Interventional Radiology Special[facility] Radiology Electronically Signed   By: Corrie Mckusick D.O.   On: 11/08/2017 08:30   Ir Radiologist Eval & Mgmt  Result Date: 11/06/2017 Please refer to notes tab for details about interventional procedure. (Op Note)       This case was discussed with [name]. She expressed agreement with my management of this patient.

## 2017-12-03 NOTE — ED Notes (Signed)
Patient transported to X-ray 

## 2017-12-03 NOTE — ED Notes (Signed)
Patient called wife and informed her he was being moved to room 1227. Patient's wife has clothes. Patient has cell phone, boots/shoes, socks and purple water bottle.

## 2017-12-03 NOTE — Consult Note (Signed)
NAME:  Matthew Lewis, MRN:  [id number], DOB:  03/19/1955, LOS: 0 ADMISSION DATE:  12/03/2017, CONSULTATION DATE:  12/03/17 REFERRING MD:  Lisbeth Ply , CHIEF COMPLAINT: Sepsis   Brief History   Patient with a history of cholangiocarcinoma, metastatic disease Has been having problems with biliary drainage system Requiring flushing He was feeling well prior to yesterday when he needed his drain flushed Multiple attempts made, finally when tube was able to be flushed He had nausea and vomited When he followed up today he also had an episode of vomiting Cultures were drawn on 11/26 showing gram-negative organisms Was admitted for sepsis  History of present illness   Weakness and episode of vomiting today He had return for follow-up today following having his drain flushed yesterday 11/26 He was in the hospital 1114 02/18/2016-was treated for hypotension, acute renal failure Biliary drainage was placed on 10/26/2017 Biopsy of the adrenal on 10/29/2017 did reveal metastatic adenocarcinoma  Past Medical History  History of diabetes Coronary artery disease Alcoholic cirrhosis Metastatic cholangiocarcinoma  Significant Hospital Events   Hypotension  Consults:  PCCM 12/03/2017  Procedures:    Significant Diagnostic Tests:  Chest x-ray with no acute infiltrate  CT scan of the chest 10/27/2017 reviewed-bilateral basal nodules  Micro Data:  Blood cultures 12/03/2017 Blood cultures 12/02/2017-gram-negative rod 1 out of 2  Antimicrobials:  Rocephin 12/02/2017 Zosyn 12/03/2017.>>  Interim history/subjective:  Came in secondary to nausea, weakness Feels slightly better at the present time still feels quite weak  Objective   Blood pressure 102/60, pulse (!) 115, temperature 98.2 F (36.8 C), temperature source Oral, resp. rate (!) 21, height 5\' 11"  (1.803 m), weight 86 kg, SpO2 97 %.       No intake or output data in the 24 hours ending 12/03/17 1307 Filed Weights   12/03/17 0930  Weight: 86 kg    Examination: General: Middle-aged gentleman, does not appear to be in acute distress HENT: Moist oral mucosa Lungs: Decreased breath sounds bilaterally Cardiovascular: S1-S2 appreciated, no murmur Abdomen: Bowel sounds appreciated, drain site does not appear inflamed Extremities: No edema, no clubbing Neuro: Alert and oriented, moving all extremeties GU: Bladder scan about 260  Resolved Hospital Problem list     Assessment & Plan:  Sepsis -Continue fluid resuscitation -Lactic acid level is improving -Source of infection is likely is biliary drainage tube -Continue Zosyn -Maintain MAP 65  Gram-negative sepsis -Follow-up on culture -Continue current antibiotic therapy - Metastatic cholangio-carcinoma -Oncology following  Hypotension -Continue fluid resuscitation  Hypoglycemia -Related to sepsis  May require pressors At present he may still be behind on his fluids-on normal saline at 125 cc an hour Received 100 cc of 25% albumin Is about 3.5 L resuscitated at present  Liver cirrhosis   Best practice:  Diet: N.p.o. Pain/Anxiety/Delirium protocol (if indicated): On oxycodone VAP protocol (if indicated): Not applicable DVT prophylaxis: Enoxaparin GI prophylaxis:  Glucose control: Insulin coverage Mobility: Bedrest Code Status: DNR Family Communication: Spouse at bedside Disposition:   Labs   CBC: Recent Labs  Lab 12/01/17 1529 12/02/17 1252 12/03/17 0953  WBC 12.3* 14.2* 7.8  NEUTROABS 9.3* 13.4* 7.2  HGB 12.4* 11.8* 10.4*  HCT 39.5 36.8* 33.2*  MCV 92.5 92.7 95.4  PLT 326 248 146*    Basic Metabolic Panel: Recent Labs  Lab 12/01/17 1529 12/03/17 0953 12/03/17 1206  NA 133* 128*  --   K 4.8 4.1  --   CL 103 98  --   CO2 20* 16*  --  GLUCOSE 126* 95  --   BUN 16 25*  --   CREATININE 0.87 1.18 1.05  CALCIUM 10.3 9.1  --    GFR: Estimated Creatinine Clearance: 75.7 mL/min (by C-G formula based on SCr of  1.05 mg/dL). Recent Labs  Lab 12/01/17 1529 12/02/17 1252 12/03/17 0953 12/03/17 0958 12/03/17 1206  WBC 12.3* 14.2* 7.8  --   --   LATICACIDVEN  --   --   --  5.83* 3.7*    Liver Function Tests: Recent Labs  Lab 12/01/17 1529 12/03/17 0953  AST 54* 71*  ALT 46* 51*  ALKPHOS 622* 368*  BILITOT 1.8* 4.4*  PROT 9.7* 8.4*  ALBUMIN 3.4* 3.2*   No results for input(s): LIPASE, AMYLASE in the last 168 hours. No results for input(s): AMMONIA in the last 168 hours.  ABG    Component Value Date/Time   PHART 7.416 10/18/2008 2105   PCO2ART 41.6 10/18/2008 2105   PO2ART 216.0 (H) 10/18/2008 2105   HCO3 19.2 (L) 06/12/2016 1454   TCO2 27.5 10/18/2008 2105   ACIDBASEDEF 4.6 (H) 06/12/2016 1454   O2SAT 42.0 06/12/2016 1454     Coagulation Profile: Recent Labs  Lab 12/03/17 0953  INR 1.51    Cardiac Enzymes: No results for input(s): CKTOTAL, CKMB, CKMBINDEX, TROPONINI in the last 168 hours.  HbA1C: Hgb A1c MFr Bld  Date/Time Value Ref Range Status  11/09/2017 07:52 AM 4.9 4.8 - 5.6 % Final    Comment:    (NOTE) Pre diabetes:          5.7%-6.4% Diabetes:              >6.4% Glycemic control for   <7.0% adults with diabetes   10/27/2017 02:49 AM 6.5 (H) 4.8 - 5.6 % Final    Comment:    (NOTE)         Prediabetes: 5.7 - 6.4         Diabetes: >6.4         Glycemic control for adults with diabetes: <7.0     CBG: Recent Labs  Lab 12/03/17 1224 12/03/17 1248  GLUCAP 44* 81    Review of Systems:   Review of Systems  Constitutional: Positive for chills.  HENT: Negative.   Eyes: Negative.   Respiratory: Negative.  Negative for cough and hemoptysis.   Cardiovascular: Negative.   Gastrointestinal: Positive for nausea.  Genitourinary: Negative.  Negative for dysuria, frequency and urgency.  Musculoskeletal: Negative.   Skin: Negative.   Psychiatric/Behavioral: The patient is nervous/anxious.      Past Medical History  He,  has a past medical history of  Abscess of anal and rectal regions, Alcoholic cirrhosis ([place]), Arthritis, Cirrhosis ([place]), Colon cancer ([place]) (05/2005), Cough, DM type 2 with diabetic peripheral neuropathy ([place]), Fatigue, Fistula, anal, History of substance abuse ([place]), Hypertension, PTSD (post-traumatic stress disorder), and Sleep deprivation.   Surgical History    Past Surgical History:  Procedure Laterality Date  . ABSCESS DRAINAGE     "probably 15-20 so far at least; rectal"  . ANAL FISTULECTOMY  12/13/1998  . ANTERIOR FUSION CERVICAL SPINE  2010   "triple"; "did 2 surgeries on the same day in 4193 due to complications"  . CORONARY ANGIOPLASTY  2001  . detached muscle  2010   right chest; "after cervical fusion complications"  . ELBOW SURGERY  2007   "cut out part of a muscle"; left  . EXAMINATION UNDER ANESTHESIA  03/22/2005   fistula  . EXAMINATION  UNDER ANESTHESIA  06/06/2011   Procedure: EXAM UNDER ANESTHESIA;  Surgeon: Stark Klein, MD;  Location: [facility];  Service: General;  Laterality: N/A;  . HEMICOLECTOMY  06/06/2005   left  . INCISION AND DRAINAGE PERIRECTAL ABSCESS  03/30/2005  . INCISION AND DRAINAGE PERIRECTAL ABSCESS  12/13/2004  . INCISION AND DRAINAGE PERIRECTAL ABSCESS  07/14/2001  . INCISION AND DRAINAGE PERIRECTAL ABSCESS  08/12/2010   horseshoe abscess; Dr Redmond Pulling  . INCISION AND DRAINAGE PERIRECTAL ABSCESS  06/06/2011   Procedure: IRRIGATION AND DEBRIDEMENT PERIRECTAL ABSCESS;  Surgeon: Stark Klein, MD;  Location: [facility];  Service: General;  Laterality: N/A;  . INCISION AND DRAINAGE PERIRECTAL ABSCESS N/A 02/24/2017   Procedure: IRRIGATION AND DEBRIDEMENT PERIRECTAL ABSCESS;  Surgeon: Ralene Ok, MD;  Location: [facility];  Service: General;  Laterality: N/A;  . IR EXCHANGE BILIARY DRAIN  11/07/2017  . IR INT EXT BILIARY DRAIN WITH CHOLANGIOGRAM  10/26/2017  . IR PATIENT EVAL TECH 0-60 MINS  12/02/2017  . IR RADIOLOGIST EVAL & MGMT  11/06/2017  . [place]  . PERCUTANEOUS PINNING  PHALANX FRACTURE OF HAND  ~ 2008   "plates in 2 places"; right  . PLACEMENT OF SETON N/A 09/05/2017   Procedure: PLACEMENT OF SETON;  Surgeon: Leighton Ruff, MD;  Location: [facility];  Service: General;  Laterality: N/A;  . RADIOLOGY WITH ANESTHESIA N/A 10/25/2017   Procedure: MRI WITH ANESTHESIA;  Surgeon: Radiologist, Medication, MD;  Location: [facility];  Service: Radiology;  Laterality: N/A;  . THORACIC DISCECTOMY  [phone]     Social History   reports that he quit smoking about 26 years ago. His smoking use included cigarettes. He has a 10.00 pack-year smoking history. He has never used smokeless tobacco. He reports that he drinks alcohol. He reports that he has current or past drug history. Drugs: Oxycodone and Marijuana.   Family History   His family history includes Cancer in his mother and sister; Heart disease in his brother, father, and mother.   Allergies Allergies  Allergen Reactions  . Morphine And Related Other (See Comments)    Patient is recovering from drug addiction and wants to avoid any narcotics     Home Medications  Prior to Admission medications   Medication Sig Start Date End Date Taking? Authorizing Provider  aspirin EC 81 MG tablet Take 81 mg by mouth daily.   Yes [provider]  atorvastatin (LIPITOR) 40 MG tablet Take 1 tablet (40 mg total) by mouth daily at 6 PM. 12/02/12  Yes Viyuoh, Adeline C, MD  calcium carbonate (TUMS - DOSED IN MG ELEMENTAL CALCIUM) 500 MG chewable tablet Chew 1 tablet by mouth 2 (two) times daily as needed for indigestion or heartburn.   Yes [provider]  celecoxib (CELEBREX) 200 MG capsule Take 200 mg by mouth at bedtime.  10/10/17  Yes [provider]  empagliflozin (JARDIANCE) 10 MG TABS tablet Take 10 mg by mouth at bedtime.    Yes [provider]  gabapentin (NEURONTIN) 400 MG capsule Take 400-800 mg by mouth See admin instructions. Take 400 mg by mouth in the morning and 800 mg  in the evening   Yes [provider]  glipiZIDE (GLUCOTROL) 5 MG tablet Take 5 mg by mouth 2 (two) times daily before a meal.   Yes [provider]  Insulin Detemir (LEVEMIR FLEXPEN) 100 UNIT/ML Pen Inject 10-62 Units into the skin See admin instructions. Take 62 units subq in the morning  and 10 units in the evening   Yes [provider]  lisinopril (PRINIVIL,ZESTRIL) 20 MG tablet Take 10 mg by mouth daily.    Yes [provider]  metFORMIN (GLUCOPHAGE) 1000 MG tablet Take 1,000 mg by mouth 2 (two) times daily with a meal.   Yes [provider]  Multiple Vitamin (MULTIVITAMIN) tablet Take 1 tablet by mouth daily.   Yes [provider]  ondansetron (ZOFRAN) 8 MG tablet Take 1 tablet (8 mg total) by mouth 2 (two) times daily as needed. Start on the third day after chemotherapy. 11/06/17  Yes [name], MD  polyvinyl alcohol (LIQUIFILM TEARS) 1.4 % ophthalmic solution Place 1 drop into both eyes daily as needed for dry eyes.    Yes [provider]  prochlorperazine (COMPAZINE) 10 MG tablet Take 1 tablet (10 mg total) by mouth every 6 (six) hours as needed (Nausea or vomiting). 11/06/17  Yes [name], MD  oxyCODONE 10 MG TABS Take 1 tablet (10 mg total) by mouth every 4 (four) hours as needed for severe pain. Patient not taking: Reported on 12/03/2017 10/29/17   Nita Sells, MD     Critical care time: 30 minutes

## 2017-12-03 NOTE — Progress Notes (Unsigned)
.  ir

## 2017-12-03 NOTE — ED Notes (Signed)
DR. Johnney Killian notified of patient's lactic result.

## 2017-12-03 NOTE — Progress Notes (Signed)
Pt presents today with rigors, N/V, fever of 100.7 at home, and gen fatigue.  Took tylenol at home for fever, temp currently 99.7 orally.  Pt received IV abx and IVF yesterday, returning for same tx today.  Pt to be transported to ER per PA Lucianne Lei with wife and belongings.  Report given to ED RN Carolynne.

## 2017-12-03 NOTE — Progress Notes (Signed)
PHARMACY - PHYSICIAN COMMUNICATION CRITICAL VALUE ALERT - BLOOD CULTURE IDENTIFICATION (BCID)  Matthew Lewis is an 62 y.o. male who presented to [facility] on 12/03/2017 with a chief complaint of gram negative rod sepsis.  Assessment:  Sepsis likely source biliary drain (include suspected source if known)  Name of physician (or Provider) Contacted: Chaney Malling, NP  Current antibiotics: Zosyn  Changes to prescribed antibiotics recommended:  Continue broad spectrum coverage at this time  Results for orders placed or performed during the hospital encounter of 12/03/17  Blood Culture ID Panel (Reflexed) (Collected: 12/03/2017 10:44 AM)  Result Value Ref Range   Enterococcus species NOT DETECTED NOT DETECTED   Listeria monocytogenes NOT DETECTED NOT DETECTED   Staphylococcus species NOT DETECTED NOT DETECTED   Staphylococcus aureus (BCID) NOT DETECTED NOT DETECTED   Streptococcus species NOT DETECTED NOT DETECTED   Streptococcus agalactiae NOT DETECTED NOT DETECTED   Streptococcus pneumoniae NOT DETECTED NOT DETECTED   Streptococcus pyogenes NOT DETECTED NOT DETECTED   Acinetobacter baumannii NOT DETECTED NOT DETECTED   Enterobacteriaceae species DETECTED (A) NOT DETECTED   Enterobacter cloacae complex NOT DETECTED NOT DETECTED   Escherichia coli DETECTED (A) NOT DETECTED   Klebsiella oxytoca NOT DETECTED NOT DETECTED   Klebsiella pneumoniae NOT DETECTED NOT DETECTED   Proteus species NOT DETECTED NOT DETECTED   Serratia marcescens NOT DETECTED NOT DETECTED   Carbapenem resistance NOT DETECTED NOT DETECTED   Haemophilus influenzae NOT DETECTED NOT DETECTED   Neisseria meningitidis NOT DETECTED NOT DETECTED   Pseudomonas aeruginosa NOT DETECTED NOT DETECTED   Candida albicans NOT DETECTED NOT DETECTED   Candida glabrata NOT DETECTED NOT DETECTED   Candida krusei NOT DETECTED NOT DETECTED   Candida parapsilosis NOT DETECTED NOT DETECTED   Candida tropicalis NOT DETECTED NOT  DETECTED    Peggyann Juba, PharmD, BCPS Pager: [phone] 12/03/2017  10:05 PM

## 2017-12-03 NOTE — ED Notes (Signed)
ED TO INPATIENT HANDOFF REPORT  Name/Age/Gender Matthew Lewis 62 y.o. male  Code Status Code Status History    Date Active Date Inactive Code Status Order ID Comments User Context   11/21/2017 1605 11/24/2017 1856 DNR [id number]  Alma Friendly, MD Inpatient   11/20/2017 1931 11/21/2017 1605 Full Code [id number]  Yaakov Guthrie, MD ED   11/09/2017 0733 11/11/2017 1844 DNR [id number]  Toy Baker, MD ED   10/23/2017 2256 10/29/2017 1951 Full Code [id number]  Etta Quill, DO ED   02/24/2017 1951 02/25/2017 1725 DNR [id number]  Karmen Bongo, MD Inpatient   06/12/2016 2021 06/14/2016 2149 Full Code [id number]  Vianne Bulls, MD ED   11/30/2012 2030 12/02/2012 2057 Full Code [id number]  Louellen Molder, MD Inpatient   08/15/2011 1827 08/19/2011 1821 Full Code [id number]  Kathalene Frames, MD ED   06/05/2011 1927 06/08/2011 1410 Full Code [id number]  Whitaker, Marilynn Rail, RN Inpatient    Questions for Most Recent Historical Code Status (Order [id number])    Question Answer Comment   In the event of cardiac or respiratory ARREST Do not call a "code blue"    In the event of cardiac or respiratory ARREST Do not perform Intubation, CPR, defibrillation or ACLS    In the event of cardiac or respiratory ARREST Use medication by any route, position, wound care, and other measures to relive pain and suffering. May use oxygen, suction and manual treatment of airway obstruction as needed for comfort.       Home/SNF/Other Home  Chief Complaint No admission diagnoses are documented for this encounter.  Level of Care/Admitting Diagnosis ED Disposition    ED Disposition Condition Comment   Admit  Hospital Area: [place] [[id number]]  Level of Care: Stepdown [14]  Admit to SDU based on following criteria: Severe physiological/psychological symptoms:  Any diagnosis requiring assessment & intervention at least every 4 hours on an ongoing basis to obtain desired patient outcomes  including stability and rehabilitation  Diagnosis: Severe sepsis with acute organ dysfunction due to Gram negative bacteria [facility]) [[id number]]  Admitting Physician: Desiree Hane [[id number]]  Attending Physician: Desiree Hane [phone]  Estimated length of stay: past midnight tomorrow  Certification:: I certify this patient will need inpatient services for at least 2 midnights  PT Class (Do Not Modify): Inpatient [101]  PT Acc Code (Do Not Modify): Private [1]       Medical History Past Medical History:  Diagnosis Date  . Abscess of anal and rectal regions    horse shoe abscess  . Alcoholic cirrhosis ([place])   . Arthritis    "everywhere; especially in my spine"  . Cirrhosis ([place])    STAGE 1 CIRRHOSIS PATIENT SEES DR NAMDIGAM FOR  . Colon cancer ([place]) 05/2005   TX SURGERY WITH LYMPH NODE REMOVAL  . Cough    LAST FEW WEEKS SAW DR RAMOS LUNGS MILKY SPUTUM OCC  . DM type 2 with diabetic peripheral neuropathy ([facility])    left foot  . Fatigue   . Fistula, anal    multiple  . History of substance abuse ([place])    last alcohol was 08/2011; last marijuania was 08/2011  . Hypertension   . PTSD (post-traumatic stress disorder)    "severe" HX OF  . Sleep deprivation     Allergies Allergies  Allergen Reactions  . Morphine And Related Other (See Comments)    PATIENT IS RECOVERING FROM DRUG ADDICTION PATIENT WANTS TO AVOID ANY NARCOTICS  .  Other Other (See Comments)    PATIENT IS RECOVERING FROM DRUG ADDICTION PATIENT WANTS TO AVOID ANY NARCOTICS    IV Location/Drains/Wounds Patient Lines/Drains/Airways Status   Active Line/Drains/Airways    Name:   Placement date:   Placement time:   Site:   Days:   Peripheral IV 12/03/17 Left Antecubital   12/03/17    1001    Antecubital   less than 1   Peripheral IV 12/03/17 Right Hand   12/03/17    1035    Hand   less than 1   Biliary Tube Cook slip-coat 10.2 Fr. RUQ   11/07/17    1707    RUQ   26   Incision (Closed) 02/24/17 Rectum   02/24/17     1812     282          Labs/Imaging Results for orders placed or performed during the hospital encounter of 12/03/17 (from the past 48 hour(s))  Comprehensive metabolic panel     Status: Abnormal   Collection Time: 12/03/17  9:53 AM  Result Value Ref Range   Sodium 128 (L) 135 - 145 mmol/L   Potassium 4.1 3.5 - 5.1 mmol/L   Chloride 98 98 - 111 mmol/L   CO2 16 (L) 22 - 32 mmol/L   Glucose, Bld 95 70 - 99 mg/dL   BUN 25 (H) 8 - 23 mg/dL   Creatinine, Ser 1.18 0.61 - 1.24 mg/dL   Calcium 9.1 8.9 - 10.3 mg/dL   Total Protein 8.4 (H) 6.5 - 8.1 g/dL   Albumin 3.2 (L) 3.5 - 5.0 g/dL   AST 71 (H) 15 - 41 U/L   ALT 51 (H) 0 - 44 U/L   Alkaline Phosphatase 368 (H) 38 - 126 U/L   Total Bilirubin 4.4 (H) 0.3 - 1.2 mg/dL   GFR calc non Af Amer >60 >60 mL/min   GFR calc Af Amer >60 >60 mL/min   Anion gap 14 5 - 15    Comment: Performed at [facility], [place] [address] [place], [place] [address]  CBC with Differential     Status: Abnormal   Collection Time: 12/03/17  9:53 AM  Result Value Ref Range   WBC 7.8 4.0 - 10.5 K/uL   RBC 3.48 (L) 4.22 - 5.81 MIL/uL   Hemoglobin 10.4 (L) 13.0 - 17.0 g/dL   HCT 33.2 (L) 39.0 - 52.0 %   MCV 95.4 80.0 - 100.0 fL   MCH 29.9 26.0 - 34.0 pg   MCHC 31.3 30.0 - 36.0 g/dL   RDW 16.3 (H) 11.5 - 15.5 %   Platelets 146 (L) 150 - 400 K/uL   nRBC 0.0 0.0 - 0.2 %   Neutrophils Relative % 97 %    Comment: MILD LEFT SHIFT (1-5% METAS, OCC MYELO, OCC BANDS)   Neutro Abs 7.2 1.7 - 7.7 K/uL   Lymphocytes Relative 2 %   Lymphs Abs 0.2 (L) 0.7 - 4.0 K/uL   Monocytes Relative 1 %   Monocytes Absolute 0.1 0.1 - 1.0 K/uL   Eosinophils Relative 0 %   Eosinophils Absolute 0.0 0.0 - 0.5 K/uL   Basophils Relative 0 %   Basophils Absolute 0.0 0.0 - 0.1 K/uL   Immature Granulocytes 0 %   Abs Immature Granulocytes 0.01 0.00 - 0.07 K/uL    Comment: Performed at [facility], [place] [address], [place], [place] [address]   Protime-INR     Status: Abnormal   Collection  Time: 12/03/17  9:53 AM  Result Value Ref Range   Prothrombin Time 18.0 (H) 11.4 - 15.2 seconds   INR 1.51     Comment: Performed at [facility], [place] [address] [place], [place] [address]  I-Stat CG4 Lactic Acid, ED     Status: Abnormal   Collection Time: 12/03/17  9:58 AM  Result Value Ref Range   Lactic Acid, Venous 5.83 (HH) 0.5 - 1.9 mmol/L   Comment NOTIFIED PHYSICIAN    Dg Chest 2 View  Result Date: 12/03/2017 CLINICAL DATA:  Fever.  Sepsis evaluation. EXAM: CHEST - 2 VIEW COMPARISON:  11/20/2017 FINDINGS: Patchy airspace disease noted posteriorly on the lateral at the lung base. I suspect this is likely within the right lower lobe and concerning for pneumonia. Heart is normal size. No effusions. No acute bony abnormality. IMPRESSION: Patchy posterior basilar opacity best seen on the lateral view, likely in the right lung base concerning for right lower lobe pneumonia. Electronically Signed   By: Rolm Baptise M.D.   On: 12/03/2017 10:23   Ir Patient Eval Tech 0-60 Mins  Result Date: 12/02/2017 Chipper Oman     12/02/2017 12:46 PM Patient came in today with complaint of biliary tube not draining.  I evaluated the tube.  It flushed easily.  The patient stated that he was unable to aspirate bile.  I placed a new drain bag to gravity and got minimal bloody bile bile draining into the bag.  I discussed this with Dr Barbie Banner.  I explained to the patient that he had an internal/ external biliary drain catheter so it was draining internal which is the desired goal. .  It flushed easily and the statlock and sutures were intact at the original position.  No images were requested.  The patient was advised to call us if any further problems arise.  Otherwise , he will return for a routine exchange at his scheduled interval.       Pending Labs Unresulted Labs (From admission, onward)    Start     Ordered   12/03/17 0942  Culture,  blood (routine x 2)  BLOOD CULTURE X 2,   STAT     12/03/17 0941          Vitals/Pain Today's Vitals   12/03/17 1000 12/03/17 1047 12/03/17 1055 12/03/17 1100  BP: (!) 95/59 (!) 85/54 (!) 89/58 (!) 92/57  Pulse: (!) 121 (!) 115 (!) 116 (!) 115  Resp: (!) 24 17 17  (!) 25  Temp:      TempSrc:      SpO2: 99% 100% 98% 99%  Weight:      Height:      PainSc:        Isolation Precautions No active isolations  Medications Medications  sodium chloride 0.9 % bolus 1,000 mL (has no administration in time range)  sodium chloride 0.9 % bolus 2,580 mL (1,000 mLs Intravenous New Bag/Given 12/03/17 1003)  piperacillin-tazobactam (ZOSYN) IVPB 3.375 g (3.375 g Intravenous New Bag/Given 12/03/17 1039)    Mobility Walks with device.

## 2017-12-03 NOTE — Procedures (Signed)
Central Venous Catheter Insertion Procedure Note YOSGART PAVEY [id number] 11/01/1955  Procedure: Insertion of Central Venous Catheter Indications: Drug and/or fluid administration  Procedure Details Consent: Risks of procedure as well as the alternatives and risks of each were explained to the (patient/caregiver).  Consent for procedure obtained. Time Out: Verified patient identification, verified procedure, site/side was marked, verified correct patient position, special equipment/implants available, medications/allergies/relevent history reviewed, required imaging and test results available.  Performed  Maximum sterile technique was used including antiseptics, cap, gloves, gown, hand hygiene, mask and sheet. Skin prep: Chlorhexidine; local anesthetic administered A antimicrobial bonded/coated triple lumen catheter was placed in the right internal jugular vein using the Seldinger technique.  Evaluation Blood flow good Complications: No apparent complications Patient did tolerate procedure well. Chest X-ray ordered to verify placement.  CXR: pending.  Vaani Morren A Erynn Vaca 12/03/2017, 3:01 PM

## 2017-12-03 NOTE — Progress Notes (Signed)
Matthew Lewis   DOB:September 26, 1955   [id number]   XHB#:[id number]  Oncology follow-up note  Subjective: Patient is well-known to me, under my care for his recently diagnosed metastatic cholangiocarcinoma.  He was admitted from my office this morning for bacteremia and sepsis.  He was seen in my office yesterday afternoon, after percutaneous biliary drain tube was flushed by IR in the morning and he developed significant rigors.  We gave him IV fluids and ceftriaxone in the clinic, recommended him to be admitted to [facility] and he declined.  His blood culture this morning showed Gram negative rods, he was found to have rigors in the waiting room this morning, and we sent him to emergency room.  He was found to be hypotensive, and was admitted to ICU.  He is currently Levophed, IV fluids and Zosyn, vital signs has stabilized.    Objective:  Vitals:   12/03/17 1945 12/03/17 2000  BP: 106/67   Pulse: (!) 111   Resp: (!) 21   Temp:  98.4 F (36.9 C)  SpO2: 95%     Body mass index is 26.46 kg/m.  Intake/Output Summary (Last 24 hours) at 12/03/2017 2103 Last data filed at 12/03/2017 1951 Gross per 24 hour  Intake 2173.38 ml  Output 625 ml  Net 1548.38 ml     Sclerae mildly icteric  Oropharynx clear  No peripheral adenopathy  Lungs clear -- no rales or rhonchi  Heart regular rate and rhythm  Abdomen soft, (+) percutaneous biliary drainage tube in the right upper quadrant  MSK no focal spinal tenderness, no peripheral edema  Neuro nonfocal   CBG (last 3)  Recent Labs    12/03/17 1625 12/03/17 1820 12/03/17 2020  GLUCAP 89 122* 209*     Labs:  Lab Results  Component Value Date   WBC 7.8 12/03/2017   HGB 10.4 (L) 12/03/2017   HCT 33.2 (L) 12/03/2017   MCV 95.4 12/03/2017   PLT 146 (L) 12/03/2017   NEUTROABS 7.2 12/03/2017   CMP Latest Ref Rng & Units 12/03/2017 12/03/2017 12/01/2017  Glucose 70 - 99 mg/dL - 95 126(H)  BUN 8 - 23 mg/dL - 25(H) 16  Creatinine  0.61 - 1.24 mg/dL 1.05 1.18 0.87  Sodium 135 - 145 mmol/L - 128(L) 133(L)  Potassium 3.5 - 5.1 mmol/L - 4.1 4.8  Chloride 98 - 111 mmol/L - 98 103  CO2 22 - 32 mmol/L - 16(L) 20(L)  Calcium 8.9 - 10.3 mg/dL - 9.1 10.3  Total Protein 6.5 - 8.1 g/dL - 8.4(H) 9.7(H)  Total Bilirubin 0.3 - 1.2 mg/dL - 4.4(H) 1.8(H)  Alkaline Phos 38 - 126 U/L - 368(H) 622(H)  AST 15 - 41 U/L - 71(H) 54(H)  ALT 0 - 44 U/L - 51(H) 46(H)     Urine Studies No results for input(s): UHGB, CRYS in the last 72 hours.  Invalid input(s): UACOL, UAPR, USPG, UPH, UTP, UGL, UKET, UBIL, UNIT, UROB, ULEU, UEPI, UWBC, URBC, UBAC, CAST, [place], [place]  Basic Metabolic Panel: Recent Labs  Lab 12/01/17 1529 12/03/17 0953 12/03/17 1206  NA 133* 128*  --   K 4.8 4.1  --   CL 103 98  --   CO2 20* 16*  --   GLUCOSE 126* 95  --   BUN 16 25*  --   CREATININE 0.87 1.18 1.05  CALCIUM 10.3 9.1  --    GFR Estimated Creatinine Clearance: 75.7 mL/min (by C-G formula based on SCr of 1.05 mg/dL). Liver  Function Tests: Recent Labs  Lab 12/01/17 1529 12/03/17 0953  AST 54* 71*  ALT 46* 51*  ALKPHOS 622* 368*  BILITOT 1.8* 4.4*  PROT 9.7* 8.4*  ALBUMIN 3.4* 3.2*   No results for input(s): LIPASE, AMYLASE in the last 168 hours. No results for input(s): AMMONIA in the last 168 hours. Coagulation profile Recent Labs  Lab 12/03/17 0953  INR 1.51    CBC: Recent Labs  Lab 12/01/17 1529 12/02/17 1252 12/03/17 0953  WBC 12.3* 14.2* 7.8  NEUTROABS 9.3* 13.4* 7.2  HGB 12.4* 11.8* 10.4*  HCT 39.5 36.8* 33.2*  MCV 92.5 92.7 95.4  PLT 326 248 146*   Cardiac Enzymes: No results for input(s): CKTOTAL, CKMB, CKMBINDEX, TROPONINI in the last 168 hours. BNP: Invalid input(s): POCBNP CBG: Recent Labs  Lab 12/03/17 1408 12/03/17 1431 12/03/17 1625 12/03/17 1820 12/03/17 2020  GLUCAP 55* 74 89 122* 209*   D-Dimer No results for input(s): DDIMER in the last 72 hours. Hgb A1c No results for input(s): HGBA1C in  the last 72 hours. Lipid Profile No results for input(s): CHOL, HDL, LDLCALC, TRIG, CHOLHDL, LDLDIRECT in the last 72 hours. Thyroid function studies No results for input(s): TSH, T4TOTAL, T3FREE, THYROIDAB in the last 72 hours.  Invalid input(s): FREET3 Anemia work up No results for input(s): VITAMINB12, FOLATE, FERRITIN, TIBC, IRON, RETICCTPCT in the last 72 hours. Microbiology Recent Results (from the past 240 hour(s))  Culture, Blood     Status: None (Preliminary result)   Collection Time: 12/02/17 12:51 PM  Result Value Ref Range Status   Specimen Description BLOOD LEFT HAND  Final   Special Requests   Final    BOTTLES DRAWN AEROBIC AND ANAEROBIC Blood Culture adequate volume   Culture   Final    NO GROWTH < 24 HOURS Performed at [facility], [address] [place], [place] [address]    Report Status PENDING  Incomplete  Culture, Blood     Status: None (Preliminary result)   Collection Time: 12/02/17 12:51 PM  Result Value Ref Range Status   Specimen Description BLOOD RIGHT HAND  Final   Special Requests   Final    BOTTLES DRAWN AEROBIC AND ANAEROBIC Blood Culture adequate volume   Culture  Setup Time   Final    GRAM NEGATIVE RODS ANAEROBIC BOTTLE ONLY Organism ID to follow Performed at [facility], [place] [address], [place], [place] [address]    Culture GRAM NEGATIVE RODS  Final   Report Status PENDING  Incomplete  Culture, blood (routine x 2)     Status: None (Preliminary result)   Collection Time: 12/03/17 10:44 AM  Result Value Ref Range Status   Specimen Description   Final    BLOOD LEFT HAND Performed at [facility] [address] [place], [place] [address]    Special Requests   Final    BOTTLES DRAWN AEROBIC AND ANAEROBIC Blood Culture adequate volume Performed at [facility] [address], [place], [place] [address]    Culture  Setup Time   Final    GRAM NEGATIVE RODS ANAEROBIC BOTTLE ONLY Organism  ID to follow Performed at [facility], [place] [address], [place], [place] [address]    Culture PENDING  Incomplete   Report Status PENDING  Incomplete      Studies:  Dg Chest 2 View  Result Date: 12/03/2017 CLINICAL DATA:  Fever.  Sepsis evaluation. EXAM: CHEST - 2 VIEW COMPARISON:  11/20/2017 FINDINGS: Patchy airspace disease noted posteriorly on the lateral at the lung base. I suspect this is likely within the right lower lobe and concerning for pneumonia. Heart is normal size. No effusions. No acute bony abnormality. IMPRESSION: Patchy posterior basilar opacity best seen on the lateral view, likely in the right lung base concerning for right lower lobe pneumonia. Electronically Signed   By: Rolm Baptise M.D.   On: 12/03/2017 10:23   Ct Abdomen Pelvis W Contrast  Result Date: 12/03/2017 CLINICAL DATA:  62 y/o M; history of intrahepatic cholangiocarcinoma presenting with sepsis from gram-negative bacteremia. Suspected biliary infection. Biliary drain in situ. EXAM: CT ABDOMEN AND PELVIS WITH CONTRAST TECHNIQUE: Multidetector CT imaging of the abdomen and pelvis was performed using the standard protocol following bolus administration of intravenous contrast. CONTRAST:  137m OMNIPAQUE IOHEXOL 300 MG/ML  SOLN COMPARISON:  11/08/2017 CT abdomen and pelvis. FINDINGS: Lower chest: Stable round lung base mass spanning approximately 5.1 x 3.8 cm in left lung base 2.8 cm pulmonary nodule. Hepatobiliary: Liver cirrhosis. Internal external biliary drain is present traversing the right lobe of the liver, common bile duct, with pigtail in the second segment of the duodenum is stable in position from the prior CT of abdomen and pelvis. No associated rim enhancing abscess identified. Mild interval increase in perihepatic ascites. Stable ill-defined mass within the left liver hilum measuring approximately 5.1 x 2.2 cm and multiple additional hepatic metastases measuring up to 3.7 cm. Stable intrahepatic  biliary ductal dilatation throughout the left lobe of liver. 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 Pancreas: Unremarkable. No pancreatic ductal dilatation or surrounding inflammatory changes. Spleen: Normal in size without focal abnormality. Adrenals/Urinary Tract: Stable bilateral adrenal metastasis. Left kidney lower pole 3 mm nonobstructing kidney stone is stable. No focal kidney lesion. No hydronephrosis. Normal bladder. Stomach/Bowel: No obstructive or inflammatory changes of the stomach, small bowel, or large bowel identified. Stable partial colectomy with patent anastomoses. Vascular/Lymphatic: Calcific aortic atherosclerosis. Stable upper abdominal retroperitoneal, periportal, and gastrohepatic lymphadenopathy. Reproductive: Prostate calcifications. Other: Postsurgical changes within the anterior abdominal wall with scarring from prior surgery. No hernia. Stable left lower abdominal mesenteric mass measuring 3.5 x 2.2 cm. Musculoskeletal: No fracture is seen. Stable chronic L5 pars defects and grade 1 L5-S1 anterolisthesis. Stable spondylosis of the spine with disc and facet degenerative changes greatest at L4-5 and L5-S1. IMPRESSION: 1. Stable position of internal external biliary drain. Stable intrahepatic biliary ductal dilatation of the left lobe. Stable left liver hilar mass and multiple hepatic metastases. No abscess identified. 2. Mild increase in upper abdominal ascites. 3. Stable pulmonary, adrenal, mesenteric, and retroperitoneal lymph node metastasis. Electronically Signed   By: LKristine GarbeM.D.   On: 12/03/2017 18:05   Dg Chest Port 1 View  Result Date: 12/03/2017 CLINICAL DATA:  Encounter for central line placement. EXAM: PORTABLE CHEST 1 VIEW 2:55 p.m. COMPARISON:  Chest x-ray dated 12/03/2017 at 10:16 a.m. FINDINGS: New right jugular vein catheter tip is at the cavoatrial junction 4.8 cm below the carina. No pneumothorax. Heart size and vascularity  are normal. 2.5 cm mass at the left lung base posteriorly is barely visible on this radiograph. The ill-defined mass at the right lung base is poorly seen on this radiograph. IMPRESSION: Central venous catheter appears in good position. No acute abnormality. Faintly visible masses at both lung bases as previously demonstrated. Electronically Signed   By: JLorriane ShireM.D.   On: 12/03/2017 15:20   Ir Patient [employer]  0-60 Mins  Result Date: 12/02/2017 Chipper [place]     12/02/2017 12:46 PM Patient came in today with complaint of biliary tube not draining.  I evaluated the tube.  It flushed easily.  The patient stated that he was unable to aspirate bile.  I placed a new drain bag to gravity and got minimal bloody bile bile draining into the bag.  I discussed this with Dr Barbie Banner.  I explained to the patient that he had an internal/ external biliary drain catheter so it was draining internal which is the desired goal. .  It flushed easily and the statlock and sutures were intact at the original position.  No images were requested.  The patient was advised to call us if any further problems arise.  Otherwise , he will return for a routine exchange at his scheduled interval.       Assessment: 62 y.o. male with recently diagnosed metastatic cholangiocarcinoma to liver, lung and adrenal gland, untreated, but struct of jaundice, status post percutaneous biliary drainage tube placement, liver cirrhosis, multiple recent hospitalization for sepsis and dehydration, was admitted for sepsis from bacteremia  1. Sepsis from GNR bacteriemia 2. Hypotension from sepsis, on pressor  3.  Metastatic cholangiocarcinoma, untreated  4. Recurrent sepsis and bacteremia, source likely the biliary drain  5. Deconditioning  6. DNR/DNI   Plan:  -Agree with broad antibiotics, he is currently on Zosyn -He still requires IV fluids and pressor -I have spoke with IR Dr. Kathlene Cote today about his recurrent infection and dehydration  from his biliary drain. He has reviewed his case, and will try biliary stent placement and removal of the biliary drain. He will be available to do it next Tuesday. Please consult IR, if his infection is difficult to control, consult IR to see if the tube needs to be removed sooner  -I have not been able to start him on chemo due to his frequent hospitalization for infection and dehydration, and overall poor performance status.  We previously discussed palliative care alone versus trying chemo, patient would like to try chemo if he is able to.  -Pt understands his cancer is incurable at this stage, due to his medical comorbidities, especially liver cirrrhosis, his prognosis is guarded.  He agrees with DNR/DNI.  -I will follow up on Monday. Please call my on-call partner if needed over the holiday and weekend. Appreciate the hospitalist team, ICU and ID's help and great care.    [name], MD 12/03/2017  9:03 PM

## 2017-12-03 NOTE — ED Notes (Signed)
Pt blood pressure trending down, most recent 85/54 with 2L 0.9%NaCL flowing.  MD made aware.  Verbally ordered 3rd liter of saline added.

## 2017-12-03 NOTE — Progress Notes (Signed)
Pharmacy Antibiotic Note  Matthew Lewis is a 62 y.o. male admitted on 12/03/2017 with severe sepsis due to gram negative bacteremia. Pharmacy has been consulted for Zosyn dosing.  Plan:  Zosyn 3.375g IV x 1 over 30 minutes given in the ED. Continue with Zosyn 3.375g IV q8h (each dose infused over 4 hours).  Monitor renal function, cultures, clinical course.     Height: 5\' 11"  (180.3 cm) Weight: 189 lb 11.2 oz (86 kg) IBW/kg (Calculated) : 75.3  Temp (24hrs), Avg:99.3 F (37.4 C), Min:98.8 F (37.1 C), Max:99.7 F (37.6 C)  Recent Labs  Lab 12/01/17 1529 12/02/17 1252 12/03/17 0953 12/03/17 0958  WBC 12.3* 14.2* 7.8  --   CREATININE 0.87  --  1.18  --   LATICACIDVEN  --   --   --  5.83*    Estimated Creatinine Clearance: 67.4 mL/min (by C-G formula based on SCr of 1.18 mg/dL).    Allergies  Allergen Reactions  . Morphine And Related Other (See Comments)    Patient is recovering from drug addiction and wants to avoid any narcotics    Antimicrobials this admission: 11/26 Ceftriaxone x 1 11/27 Zosyn >>   Dose adjustments this admission: --  Microbiology results: 11/26 BCx x 2 (at [facility]): anaerobic bottle of 1 set with GNR 11/27 BCx x 2: sent   Thank you for allowing pharmacy to be a part of this patient's care.   Lindell Spar, PharmD, BCPS Pager: [phone] 12/03/2017 11:39 AM

## 2017-12-03 NOTE — ED Notes (Signed)
Bed: JP21 Expected date:  Expected time:  Means of arrival:  Comments: Coming from cancer center-

## 2017-12-03 NOTE — Progress Notes (Signed)
CRITICAL VALUE ALERT  Critical Value:  2.9 lactic acid   Date & Time Notied:  12/03/2017 1724  Provider Notified: Dr. Ander Slade  Orders Received/Actions taken: Verbal acknowledgment

## 2017-12-03 NOTE — Progress Notes (Signed)
Initial Nutrition Assessment  DOCUMENTATION CODES:   Non-severe (moderate) malnutrition in context of chronic illness  INTERVENTION:  - Diet advancement as medically feasible. - Will order Boost Plus TID for once diet advanced to at least FLD, each supplement provides 360 kcal and 14 grams of protein. - Encourage PO intakes once diet advanced.    NUTRITION DIAGNOSIS:   Moderate Malnutrition related to chronic illness, catabolic illness, cancer and cancer related treatments as evidenced by mild fat depletion, moderate fat depletion, mild muscle depletion, moderate muscle depletion.  GOAL:   Patient will meet greater than or equal to 90% of their needs  MONITOR:   Diet advancement, PO intake, Supplement acceptance, Weight trends, Labs  REASON FOR ASSESSMENT:   Malnutrition Screening Tool  ASSESSMENT:    62 y.o. male with complex medical history which includes intrahepatic cholangiocarcinoma with malignant neoplasm metastatic to the adrenal gland.  Patient had percutaneous biliary drainage tube placement on 10\20. At the Cancer Center on 11/27 patient was noted to be tachycardic and hypotensive. He was referred to the ED for sepsis.  BMI indicates overweight status. Patient reports that he had not been having any abdominal pain or N/V until yesterday. He reports 1 episode of vomiting yesterday and 1 episode today. Last PO intake was a bottle of Boost this AM and last meal was grits with scrambled eggs for dinner on 11/26.   Patient reports good appetite leading up to admission: eating 3 meals/day and drinking Boost and low-sodium V8 juice at least once/day. He denies any taste changes or discomfort with eating. He reports plan is for Port placement on Tuesday (12/3) and then to begin chemo.   Patient reports that weight at home yesterday was 189 lb. Weight today 190 lb, after IVF given. Patient does not know if he has lost weight recently and feels that if he did it was only a few  pounds. Per chart review, weight on 10/31 was 220 lb. This indicates 30 lb weight loss (13.6% body weight) in the past 1 month, which is significant for time frame. Unsure if this is accurate as NFPE today is very similar to NFPE done by this RD on 11/4 and patient feeling as though he may have only lost a few pounds. Also noted order for high rate IVF. WIll monitor weight trends closely.   Medications reviewed; 25 mL D50 x1 today, sliding scale Novolog. Labs reviewed; CBG: 44 and 81 mg/dL today, Na: 128 mmol/l, BUN: 25 mg/dL, Alk Phos elevated.  IVF; NS @ 125 mL/hr (3L/day).     NUTRITION - FOCUSED PHYSICAL EXAM:    Most Recent Value  Orbital Region  Mild depletion  Upper Arm Region  Moderate depletion  Thoracic and Lumbar Region  Unable to assess  Buccal Region  Mild depletion  Temple Region  Mild depletion  Clavicle Bone Region  Moderate depletion  Clavicle and Acromion Bone Region  Moderate depletion  Scapular Bone Region  Unable to assess  Dorsal Hand  No depletion  Patellar Region  Unable to assess  Anterior Thigh Region  Unable to assess  Posterior Calf Region  Unable to assess  Edema (RD Assessment)  Unable to assess  Hair  Reviewed  Eyes  Reviewed  Mouth  Reviewed  Skin  Reviewed  Nails  Reviewed       Diet Order:   Diet Order            Diet NPO time specified Except for: Sips with Meds  Diet   effective now              EDUCATION NEEDS:   No education needs have been identified at this time  Skin:  Skin Assessment: Reviewed RN Assessment  Last BM:  11/25 (two days PTA)  Height:   Ht Readings from Last 1 Encounters:  12/03/17 5' 11" (1.803 m)    Weight:   Wt Readings from Last 1 Encounters:  12/03/17 86 kg    Ideal Body Weight:  78.18 kg  BMI:  Body mass index is 26.46 kg/m.  Estimated Nutritional Needs:   Kcal:  2410-2665 kcal  Protein:  120-130 grams  Fluid:  >/= 2.2 L/day      , MS, RD, LDN, CNSC Inpatient  Clinical Dietitian Pager # [phone] After hours/weekend pager # [phone]  

## 2017-12-03 NOTE — H&P (Addendum)
History and Physical  ANTONIN MEININGER UVO:[id number] DOB: 13-Jul-1955 DOA: 12/03/2017   PCP: [name], MD  Patient coming from:Home  Chief Complaint: rigors, chills, emesis, fever   HPI: Matthew Lewis is a 62 y.o. male with medical history significant for intrahepatic cholangiocarcinoma, metastatic to liver, lung, type 2 diabetes, prior history of alcohol abuse, cirrhosis, status post percutaneous biliary drainage drainage tube for biliary obstruction who presents on 12/03/2017 with several days of rigors, fevers, nausea and vomiting and generalized malaise.   On 11/26 patient was seen by IR for biliary drain flushing.  On the same day he was seen by oncology.  At that time he had complained of abdominal pain at the site of where the drain was flushed, he had also reported significant amount of pressure during that procedure.  He was afebrile at that evaluation but found to have a WBC of 14.2, complaints of rigors, complaints of nausea and emesis x1.  Blood cultures x2 and urine cultures were obtained and he received one-time dose of Rocephin 2 g IV.  He was also given IV Zofran and 1 L of normal saline for suspected dehydration he was sent home with Keflex due to his prior history of Klebsiella infection.  He was again seen on 11/27 with similar symptoms of rigors, nausea vomiting, emesis at this time found to have a fever of 100.7.  Blood cultures from the previous day recurrent gram-negative rods (1/), he was immediately taken to the ED for further management.  Recent discharges/admissions on 11/14-11/18 for hypotension presumed secondary to dehydration.  He was empirically started on IV vancomycin, cefepime, Flagyl but blood culture showed no growth so he was de-escalate to oral Vantin on discharge.  At that time was also found to be hyponatremic with AKI which also resolved with IV fluids  Prior to that, he was admitted 11/2-11/5 for Klebsiella bacteremia, empirically treated  with vancomycin, cefepime before cultures resulted.  Suspected biliary source in setting of intrabiliary drain.  He was treated with Keflex for total 7 days on discharge   ED Course: T-max 99.7, respiratory rate 27, heart rate 116, blood pressure range 89/58-105/57.  Lactic acid 5.83.  Sodium 128, CO2 16, BUN 25, creatinine 1.18. Bilirubin 4.4, AST 71, ALT 51, alk phos 368, platelets 146 Chest x-ray showed posterior basilar opacity,?  Right lower lobe pneumonia See bolus IV fluids, IV Zosyn  [facility] was called for further management  Review of Systems:As mentioned in the history of present illness.Review of systems are otherwise negative Patient seen in the ED .   Past Medical History:  Diagnosis Date  . Abscess of anal and rectal regions    horse shoe abscess  . Alcoholic cirrhosis ([place])   . Arthritis    "everywhere; especially in my spine"  . Cirrhosis ([place])    STAGE 1 CIRRHOSIS PATIENT SEES DR NAMDIGAM FOR  . Colon cancer ([place]) 05/2005   TX SURGERY WITH LYMPH NODE REMOVAL  . Cough    LAST FEW WEEKS SAW DR RAMOS LUNGS MILKY SPUTUM OCC  . DM type 2 with diabetic peripheral neuropathy (HCC)    left foot  . Fatigue   . Fistula, anal    multiple  . History of substance abuse ([place])    last alcohol was 08/2011; last marijuania was 08/2011  . Hypertension   . PTSD (post-traumatic stress disorder)    "severe" HX OF  . Sleep deprivation    Past Surgical History:  Procedure Laterality Date  .  ABSCESS DRAINAGE     "probably 15-20 so far at least; rectal"  . ANAL FISTULECTOMY  12/13/1998  . ANTERIOR FUSION CERVICAL SPINE  2010   "triple"; "did 2 surgeries on the same day in 1610 due to complications"  . CORONARY ANGIOPLASTY  2001  . detached muscle  2010   right chest; "after cervical fusion complications"  . ELBOW SURGERY  2007   "cut out part of a muscle"; left  . EXAMINATION UNDER ANESTHESIA  03/22/2005   fistula  . EXAMINATION UNDER ANESTHESIA  06/06/2011    Procedure: EXAM UNDER ANESTHESIA;  Surgeon: Stark Klein, MD;  Location: [facility];  Service: General;  Laterality: N/A;  . HEMICOLECTOMY  06/06/2005   left  . INCISION AND DRAINAGE PERIRECTAL ABSCESS  03/30/2005  . INCISION AND DRAINAGE PERIRECTAL ABSCESS  12/13/2004  . INCISION AND DRAINAGE PERIRECTAL ABSCESS  07/14/2001  . INCISION AND DRAINAGE PERIRECTAL ABSCESS  08/12/2010   horseshoe abscess; Dr Redmond Pulling  . INCISION AND DRAINAGE PERIRECTAL ABSCESS  06/06/2011   Procedure: IRRIGATION AND DEBRIDEMENT PERIRECTAL ABSCESS;  Surgeon: Stark Klein, MD;  Location: [facility];  Service: General;  Laterality: N/A;  . INCISION AND DRAINAGE PERIRECTAL ABSCESS N/A 02/24/2017   Procedure: IRRIGATION AND DEBRIDEMENT PERIRECTAL ABSCESS;  Surgeon: Ralene Ok, MD;  Location: [facility];  Service: General;  Laterality: N/A;  . IR EXCHANGE BILIARY DRAIN  11/07/2017  . IR INT EXT BILIARY DRAIN WITH CHOLANGIOGRAM  10/26/2017  . IR PATIENT EVAL TECH 0-60 MINS  12/02/2017  . IR RADIOLOGIST EVAL & MGMT  11/06/2017  . [place]  . PERCUTANEOUS PINNING PHALANX FRACTURE OF HAND  ~ 2008   "plates in 2 places"; right  . PLACEMENT OF SETON N/A 09/05/2017   Procedure: PLACEMENT OF SETON;  Surgeon: Leighton Ruff, MD;  Location: [facility];  Service: General;  Laterality: N/A;  . RADIOLOGY WITH ANESTHESIA N/A 10/25/2017   Procedure: MRI WITH ANESTHESIA;  Surgeon: Radiologist, Medication, MD;  Location: [facility];  Service: Radiology;  Laterality: N/A;  . THORACIC DISCECTOMY  1990's   Allergies  Allergen Reactions  . Morphine And Related Other (See Comments)    Patient is recovering from drug addiction and wants to avoid any narcotics   Social History:  reports that he quit smoking about 26 years ago. His smoking use included cigarettes. He has a 10.00 pack-year smoking history. He has never used smokeless tobacco. He reports that he drinks alcohol. He reports that he has current or past drug history.  Drugs: Oxycodone and Marijuana. Family History  Problem Relation Age of Onset  . Heart disease Father   . Heart disease Mother   . Cancer Mother   . Cancer Sister   . Heart disease Brother       Prior to Admission medications   Medication Sig Start Date End Date Taking? Authorizing Provider  aspirin EC 81 MG tablet Take 81 mg by mouth daily.    [provider]  atorvastatin (LIPITOR) 40 MG tablet Take 1 tablet (40 mg total) by mouth daily at 6 PM. Patient taking differently: Take 20 mg by mouth daily at 6 PM.  12/02/12   Viyuoh, Adeline C, MD  calcium carbonate (TUMS - DOSED IN MG ELEMENTAL CALCIUM) 500 MG chewable tablet Chew 1 tablet by mouth 2 (two) times daily as needed for indigestion or heartburn.    [provider]  celecoxib (CELEBREX) 200 MG capsule Take 1 capsule by mouth at bedtime.  10/10/17   [provider]  empagliflozin (JARDIANCE) 10 MG TABS tablet Take 10 mg by mouth at bedtime.     [provider]  Fexofenadine-Pseudoephedrine (ALLEGRA-D 24 HOUR PO) Take 1 tablet by mouth every evening. WAL/FEX     [provider]  gabapentin (NEURONTIN) 400 MG capsule Take 400-800 mg by mouth 2 (two) times daily. 1 capsule am, 2 capsules pm    [provider]  glipiZIDE (GLUCOTROL) 5 MG tablet Take 5 mg by mouth 2 (two) times daily before a meal.    [provider]  Insulin Detemir (LEVEMIR FLEXPEN) 100 UNIT/ML Pen Inject 10-60 Units into the skin 2 (two) times daily. Take 62 units in the am and Take 10 units in the pm    [provider]  lisinopril (PRINIVIL,ZESTRIL) 20 MG tablet Take 10 mg by mouth daily.     [provider]  metFORMIN (GLUCOPHAGE) 1000 MG tablet Take 1,000 mg by mouth 2 (two) times daily with a meal.    [provider]  Multiple Vitamin (MULTIVITAMIN) tablet Take 1 tablet by mouth daily.    [provider]  ondansetron (ZOFRAN) 8 MG tablet Take 1 tablet (8 mg total) by  mouth 2 (two) times daily as needed. Start on the third day after chemotherapy. 11/06/17   [name], MD  oxyCODONE 10 MG TABS Take 1 tablet (10 mg total) by mouth every 4 (four) hours as needed for severe pain. 10/29/17   Nita Sells, MD  polyvinyl alcohol (LIQUIFILM TEARS) 1.4 % ophthalmic solution Place 1 drop into both eyes daily as needed for dry eyes.     [provider]  prochlorperazine (COMPAZINE) 10 MG tablet Take 1 tablet (10 mg total) by mouth every 6 (six) hours as needed (Nausea or vomiting). 11/06/17   [name], MD    Physical Exam: BP 110/68   Pulse (!) 108   Temp 98.4 F (36.9 C) (Oral)   Resp 19   Ht _0  (1.803 m)   Wt 86 kg   SpO2 96%   BMI 26.46 kg/m   Constitutional older male, no distress Eyes: scleral icterus, EOMI ENMT: Oropharynx with moist mucous membranes,  Cardiovascular: Tachycardic, regular rhythm, no MRGs, with no peripheral edema Respiratory: Normal respiratory effort on room air, clear breath sounds  Abdomen: Soft,non-tender, biliary drain in place, drain dark green bile  Skin: No rash ulcers, or lesions. Without skin tenting  Neurologic: Grossly no focal neuro deficit. Psychiatric:Appropriate affect, and mood. Mental status AAOx3          Labs on Admission:  Basic Metabolic Panel: Recent Labs  Lab 12/01/17 1529 12/03/17 0953 12/03/17 1206  NA 133* 128*  --   K 4.8 4.1  --   CL 103 98  --   CO2 20* 16*  --   GLUCOSE 126* 95  --   BUN 16 25*  --   CREATININE 0.87 1.18 1.05  CALCIUM 10.3 9.1  --    Liver Function Tests: Recent Labs  Lab 12/01/17 1529 12/03/17 0953  AST 54* 71*  ALT 46* 51*  ALKPHOS 622* 368*  BILITOT 1.8* 4.4*  PROT 9.7* 8.4*  ALBUMIN 3.4* 3.2*   No results for input(s): LIPASE, AMYLASE in the last 168 hours. No results for input(s): AMMONIA in the last 168 hours. CBC: Recent Labs  Lab 12/01/17 1529 12/02/17 1252 12/03/17 0953  WBC 12.3* 14.2* 7.8  NEUTROABS 9.3* 13.4* 7.2  HGB  12.4* 11.8* 10.4*  HCT 39.5 36.8* 33.2*  MCV 92.5 92.7 95.4  PLT 326 248 146*   Cardiac Enzymes: No results for input(s): CKTOTAL, CKMB, CKMBINDEX, TROPONINI in the last 168 hours.  BNP (last 3 results) No results for input(s): BNP in the last 8760 hours.  ProBNP (last 3 results) No results for input(s): PROBNP in the last 8760 hours.  CBG: Recent Labs  Lab 12/03/17 1408 12/03/17 1431 12/03/17 1625 12/03/17 1820 12/03/17 2020  GLUCAP 55* 74 89 122* 209*    Radiological Exams on Admission: Dg Chest 2 View  Result Date: 12/03/2017 CLINICAL DATA:  Fever.  Sepsis evaluation. EXAM: CHEST - 2 VIEW COMPARISON:  11/20/2017 FINDINGS: Patchy airspace disease noted posteriorly on the lateral at the lung base. I suspect this is likely within the right lower lobe and concerning for pneumonia. Heart is normal size. No effusions. No acute bony abnormality. IMPRESSION: Patchy posterior basilar opacity best seen on the lateral view, likely in the right lung base concerning for right lower lobe pneumonia. Electronically Signed   By: Rolm Baptise M.D.   On: 12/03/2017 10:23   Ct Abdomen Pelvis W Contrast  Result Date: 12/03/2017 CLINICAL DATA:  62 y/o M; history of intrahepatic cholangiocarcinoma presenting with sepsis from gram-negative bacteremia. Suspected biliary infection. Biliary drain in situ. EXAM: CT ABDOMEN AND PELVIS WITH CONTRAST TECHNIQUE: Multidetector CT imaging of the abdomen and pelvis was performed using the standard protocol following bolus administration of intravenous contrast. CONTRAST:  181m OMNIPAQUE IOHEXOL 300 MG/ML  SOLN COMPARISON:  11/08/2017 CT abdomen and pelvis. FINDINGS: Lower chest: Stable round lung base mass spanning approximately 5.1 x 3.8 cm in left lung base 2.8 cm pulmonary nodule. Hepatobiliary: Liver cirrhosis. Internal external biliary drain is present traversing the right lobe of the liver, common bile duct, with pigtail in the second segment of the  duodenum is stable in position from the prior CT of abdomen and pelvis. No associated rim enhancing abscess identified. Mild interval increase in perihepatic ascites. Stable ill-defined mass within the left liver hilum measuring approximately 5.1 x 2.2 cm and multiple additional hepatic metastases measuring up to 3.7 cm. Stable intrahepatic biliary ductal dilatation throughout the left lobe of liver. 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 Pancreas: Unremarkable. No pancreatic ductal dilatation or surrounding inflammatory changes. Spleen: Normal in size without focal abnormality. Adrenals/Urinary Tract: Stable bilateral adrenal metastasis. Left kidney lower pole 3 mm nonobstructing kidney stone is stable. No focal kidney lesion. No hydronephrosis. Normal bladder. Stomach/Bowel: No obstructive or inflammatory changes of the stomach, small bowel, or large bowel identified. Stable partial colectomy with patent anastomoses. Vascular/Lymphatic: Calcific aortic atherosclerosis. Stable upper abdominal retroperitoneal, periportal, and gastrohepatic lymphadenopathy. Reproductive: Prostate calcifications. Other: Postsurgical changes within the anterior abdominal wall with scarring from prior surgery. No hernia. Stable left lower abdominal mesenteric mass measuring 3.5 x 2.2 cm. Musculoskeletal: No fracture is seen. Stable chronic L5 pars defects and grade 1 L5-S1 anterolisthesis. Stable spondylosis of the spine with disc and facet degenerative changes greatest at L4-5 and L5-S1. IMPRESSION: 1. Stable position of internal external biliary drain. Stable intrahepatic biliary ductal dilatation of the left lobe. Stable left liver hilar mass and multiple hepatic metastases. No abscess identified. 2. Mild increase in upper abdominal ascites. 3. Stable pulmonary, adrenal, mesenteric, and retroperitoneal lymph node metastasis. Electronically Signed   By: LKristine GarbeM.D.   On: 12/03/2017 18:05    Dg Chest Port 1 View  Result Date: 12/03/2017 CLINICAL DATA:  Encounter for central  line placement. EXAM: PORTABLE CHEST 1 VIEW 2:55 p.m. COMPARISON:  Chest x-ray dated 12/03/2017 at 10:16 a.m. FINDINGS: New right jugular vein catheter tip is at the cavoatrial junction 4.8 cm below the carina. No pneumothorax. Heart size and vascularity are normal. 2.5 cm mass at the left lung base posteriorly is barely visible on this radiograph. The ill-defined mass at the right lung base is poorly seen on this radiograph. IMPRESSION: Central venous catheter appears in good position. No acute abnormality. Faintly visible masses at both lung bases as previously demonstrated. Electronically Signed   By: Lorriane Shire M.D.   On: 12/03/2017 15:20   Ir Patient Eval Tech 0-60 Mins  Result Date: 12/02/2017 Chipper [place]     12/02/2017 12:46 PM Patient came in today with complaint of biliary tube not draining.  I evaluated the tube.  It flushed easily.  The patient stated that he was unable to aspirate bile.  I placed a new drain bag to gravity and got minimal bloody bile bile draining into the bag.  I discussed this with Dr Barbie Banner.  I explained to the patient that he had an internal/ external biliary drain catheter so it was draining internal which is the desired goal. .  It flushed easily and the statlock and sutures were intact at the original position.  No images were requested.  The patient was advised to call us if any further problems arise.  Otherwise , he will return for a routine exchange at his scheduled interval.       EKG: Independently reviewed. sinus tachycardia.  Assessment/Plan Present on Admission: . Severe sepsis with acute organ dysfunction due to Gram negative bacteria ([place]) . Cirrhosis ([place]) . Alcoholic cirrhosis of liver without ascites ([place]) . Acute renal failure (ARF) ([place]) . Hypertension . Hyponatremia . Hypotension . Intrahepatic cholangiocarcinoma (HCC)  Active Problems:    Hypertension   Hyponatremia   Alcoholic cirrhosis of liver without ascites (HCC)   Cirrhosis (HCC)   Intrahepatic cholangiocarcinoma (HCC)   Hypotension   Acute renal failure (ARF) (HCC)   Severe sepsis with acute organ dysfunction due to Gram negative bacteria (HCC)   Septic shock (HCC)   Cholangiocarcinoma (HCC)   Severe Sepsis secondary to GNR bacteremia and lactic acidosis Suspect biliary source given biliary drain present Hopeful CT abdomen will show position of biliary drain History of Klebsiella, previously pansensitive -Consult IR, will likely need to remove biliary drain as likely source of infection -ct abdomen, ID (consulted by EDP), critical care consulted -empiric zosyn -blood cultures  Hypotension with severe lactic acidosis Has required 3.5 L fluid boluses with SBP still in 80s to 90s Lactic acid is improving Goal map 65 -Continue IV fluids, appreciate critical care recommendations (monitoring case patient needs pressors) - D5 half-normal with 125 cc/hr (n.p.o., given persistent emesis) -Trend lactic acid  Intrahepatic cholangiocarcinoma, status post biliary drain Chronically elevated bilirubin, Has not started chemotherapy Managed by Dr. Krista Blue fang Percutaneous internal/external biliary drain for biliary obstruction, last exchange on 11/1, flushed on 11/26 -Consult IR, may need removal in the setting of bacteremia? -Follow-up CT abdo  AKI Creatinine 1.18, baseline 0.4-0.6 Likely prerenal in the setting of decreased p.o. intake, sepsis -Time IV fluids -Serial BMP -Avoid nephrotoxins  Hyponatremia, hypovolemic In setting of decreased p.o. intake/sepsis Expect improvement with IV fluids -IV fluids -Serial BMP, monitor sodium  Type 2 diabetes A1c (11/09/2017): 4.9% Glucose 95 on BMP -Hold home oral hypoglycemics, reported Levemir 62 units in a.m., 10 units in p.m.  given diminished p.o. intake and severe sepsis we will hold long-acting - Monitor CBG,  sliding scale insulin as needed  Hypertension Currently hypotensive requiring aggressive fluid cessation -Hold lisinopril  DVT prophylaxis: Lovenox  Code Status: DNR, discussed on day of admission  Family Communication: No family at bedside.  Consults called: ID consulted by ED, Critical Care, IR  Admission status: Admitted as inpatient to step down unit expect greater than 2 midnight stay for severe sepsis, gram negative bacteremia, severe lactic acidosis and hypotension, and biliary drain management      Desiree Hane MD [facility]  Pager [phone]  If 7PM-7AM, please contact night-coverage [email or web address] Password [facility]  12/03/2017, 10:34 PM

## 2017-12-03 NOTE — Telephone Encounter (Signed)
Patient calls stating he has a special request his tube can [name] order propofol to be administered so he won't feel anything. The last time they used Versed and Fentanyl and he felt everything.

## 2017-12-03 NOTE — ED Notes (Signed)
Blood cultures x2 collected prior to starting antibiotics.

## 2017-12-03 NOTE — Progress Notes (Addendum)
CRITICAL VALUE ALERT  Critical Value: lactic acid 3.7  Date & Time Notied: 12/03/2017 1245  Provider Notified: Dr. Ander Slade   Orders Received/Actions taken: verbal acknowledgement

## 2017-12-03 NOTE — Progress Notes (Signed)
Right IJ central venous access was obtained uneventfully Chest x-ray pending  Start patient on levo fed for optimization of his blood pressure Maintaining MAP of 65  Change his IV fluids to D5 saline for hypoglycemia  Ambien nightly for insomnia

## 2017-12-04 DIAGNOSIS — I9589 Other hypotension: Secondary | ICD-10-CM

## 2017-12-04 DIAGNOSIS — K7030 Alcoholic cirrhosis of liver without ascites: Secondary | ICD-10-CM

## 2017-12-04 LAB — GLUCOSE, CAPILLARY
GLUCOSE-CAPILLARY: 147 mg/dL — AB (ref 70–99)
GLUCOSE-CAPILLARY: 129 mg/dL — AB (ref 70–99)
GLUCOSE-CAPILLARY: 159 mg/dL — AB (ref 70–99)
Glucose-Capillary: 135 mg/dL — ABNORMAL HIGH (ref 70–99)
Glucose-Capillary: 140 mg/dL — ABNORMAL HIGH (ref 70–99)
Glucose-Capillary: 143 mg/dL — ABNORMAL HIGH (ref 70–99)
Glucose-Capillary: 157 mg/dL — ABNORMAL HIGH (ref 70–99)

## 2017-12-04 LAB — COMPREHENSIVE METABOLIC PANEL
ALT: 43 U/L (ref 0–44)
AST: 61 U/L — ABNORMAL HIGH (ref 15–41)
Albumin: 2.6 g/dL — ABNORMAL LOW (ref 3.5–5.0)
Alkaline Phosphatase: 210 U/L — ABNORMAL HIGH (ref 38–126)
Anion gap: 7 (ref 5–15)
BUN: 17 mg/dL (ref 8–23)
CO2: 18 mmol/L — ABNORMAL LOW (ref 22–32)
Calcium: 8.2 mg/dL — ABNORMAL LOW (ref 8.9–10.3)
Chloride: 108 mmol/L (ref 98–111)
Creatinine, Ser: 0.69 mg/dL (ref 0.61–1.24)
GFR calc Af Amer: >60 mL/min (ref >60)
GFR calc non Af Amer: >60 mL/min (ref >60)
Glucose, Bld: 174 mg/dL — ABNORMAL HIGH (ref 70–99)
Potassium: 3.5 mmol/L (ref 3.5–5.1)
Sodium: 133 mmol/L — ABNORMAL LOW (ref 135–145)
Total Bilirubin: 3.1 mg/dL — ABNORMAL HIGH (ref 0.3–1.2)
Total Protein: 6.7 g/dL (ref 6.5–8.1)

## 2017-12-04 LAB — CBC
HCT: 26.2 % — ABNORMAL LOW (ref 39.0–52.0)
Hemoglobin: 8.3 g/dL — ABNORMAL LOW (ref 13.0–17.0)
MCH: 30.2 pg (ref 26.0–34.0)
MCHC: 31.7 g/dL (ref 30.0–36.0)
MCV: 95.3 fL (ref 80.0–100.0)
Platelets: 125 10*3/uL — ABNORMAL LOW (ref 150–400)
RBC: 2.75 MIL/uL — ABNORMAL LOW (ref 4.22–5.81)
RDW: 16.6 % — ABNORMAL HIGH (ref 11.5–15.5)
WBC: 8.3 10*3/uL (ref 4.0–10.5)
nRBC: 0 % (ref 0.0–0.2)

## 2017-12-04 MED ORDER — GABAPENTIN 400 MG PO CAPS
800.0000 mg | ORAL_CAPSULE | Freq: Every evening | ORAL | Status: DC
  Administered 2017-12-04 – 2017-12-10 (×7): 800 mg via ORAL
  Filled 2017-12-04 (×7): qty 2

## 2017-12-04 MED ORDER — GABAPENTIN 400 MG PO CAPS
400.0000 mg | ORAL_CAPSULE | Freq: Every day | ORAL | Status: DC
  Administered 2017-12-05 – 2017-12-11 (×6): 400 mg via ORAL
  Filled 2017-12-04 (×6): qty 1

## 2017-12-04 MED ORDER — POTASSIUM CHLORIDE CRYS ER 20 MEQ PO TBCR
40.0000 meq | EXTENDED_RELEASE_TABLET | Freq: Once | ORAL | Status: AC
  Administered 2017-12-04: 40 meq via ORAL
  Filled 2017-12-04: qty 2

## 2017-12-04 MED ORDER — CELECOXIB 200 MG PO CAPS
200.0000 mg | ORAL_CAPSULE | Freq: Every evening | ORAL | Status: DC
  Administered 2017-12-04 – 2017-12-10 (×7): 200 mg via ORAL
  Filled 2017-12-04 (×8): qty 1

## 2017-12-04 NOTE — Progress Notes (Signed)
[facility] PROGRESS NOTE  Matthew Lewis SWF:[id number] DOB: 01-May-1955 DOA: 12/03/2017  PCP: [name], MD  Brief History/Interval Summary:  62 y.o. male with medical history significant for intrahepatic cholangiocarcinoma, metastatic to liver and lung, type 2 diabetes, prior history of alcohol abuse, cirrhosis, status post percutaneous biliary drainage drainage tube for biliary obstruction who presented on 12/03/2017 with several days of rigors, fevers, nausea and vomiting and generalized malaise.  On 11/26 patient was seen by IR for biliary drain flushing.  On the same day he was seen by oncology.  At that time he had complained of abdominal pain at the site of where the drain was flushed, he had also reported significant amount of pressure during that procedure.  He was afebrile at that evaluation but found to have a WBC of 14.2, complaints of rigors, complaints of nausea and emesis x1.  Blood cultures x2 and urine cultures were obtained and he received one-time dose of Rocephin 2 g IV.  He was also given IV Zofran and 1 L of normal saline for suspected dehydration. He was sent home with Keflex due to his prior history of Klebsiella infection.  Cultures were positive for gram-negative bacteria.  Patient was hospitalized for further management.  Reason for Visit: Gram negative bacteremia  Consultants: Medical oncology.  Interventional radiology.  Critical care medicine  Procedures: Central line placement in the right IJ  Antibiotics: Zosyn  Subjective/Interval History: Patient states that he is feeling better this morning.  Still feels fatigued.  Some abdominal pain at times.  Not currently.  No nausea vomiting currently.  No diarrhea.  Denies any shortness of breath.  ROS: No headaches  Objective:  Vital Signs  Vitals:   12/04/17 0845 12/04/17 0900 12/04/17 0915 12/04/17 0930  BP: (!) 96/56 99/63 108/63 94/64  Pulse: 96 95 96 96  Resp: 18 (!) 23 (!) 22 (!) 29   Temp:      TempSrc:      SpO2: 99% 100% 99% 98%  Weight:      Height:        Intake/Output Summary (Last 24 hours) at 12/04/2017 1055 Last data filed at 12/04/2017 0900 Gross per 24 hour  Intake 4211.49 ml  Output 4325 ml  Net -113.51 ml   Filed Weights   12/03/17 0930  Weight: 86 kg    General appearance: alert, cooperative, appears stated age, no distress and Mildly jaundiced Head: Normocephalic, without obvious abnormality, atraumatic Resp: Normal effort at rest.  Diminished air entry at the bases.  No wheezing rales or rhonchi Cardio: regular rate and rhythm, S1, S2 normal, no murmur, click, rub or gallop GI: Abdomen is mildly distended.  Soft.  Nontender.  Biliary drain is noted.  Bowel sounds are present.  Hepatomegaly is appreciated. Extremities: edema 1+ Pulses: 2+ and symmetric Neurologic: Alert and oriented x3.  Cranial nerves II to XII intact.  Motor strength equal bilateral upper and lower extremities.  Lab Results:  Data Reviewed: I have personally reviewed following labs and imaging studies  CBC: Recent Labs  Lab 12/01/17 1529 12/02/17 1252 12/03/17 0953 12/04/17 0846  WBC 12.3* 14.2* 7.8 8.3  NEUTROABS 9.3* 13.4* 7.2  --   HGB 12.4* 11.8* 10.4* 8.3*  HCT 39.5 36.8* 33.2* 26.2*  MCV 92.5 92.7 95.4 95.3  PLT 326 248 146* 125*    Basic Metabolic Panel: Recent Labs  Lab 12/01/17 1529 12/03/17 0953 12/03/17 1206 12/04/17 0846  NA 133* 128*  --  133*  K  4.8 4.1  --  3.5  CL 103 98  --  108  CO2 20* 16*  --  18*  GLUCOSE 126* 95  --  174*  BUN 16 25*  --  17  CREATININE 0.87 1.18 1.05 0.69  CALCIUM 10.3 9.1  --  8.2*    GFR: Estimated Creatinine Clearance: 99.4 mL/min (by C-G formula based on SCr of 0.69 mg/dL).  Liver Function Tests: Recent Labs  Lab 12/01/17 1529 12/03/17 0953 12/04/17 0846  AST 54* 71* 61*  ALT 46* 51* 43  ALKPHOS 622* 368* 210*  BILITOT 1.8* 4.4* 3.1*  PROT 9.7* 8.4* 6.7  ALBUMIN 3.4* 3.2* 2.6*     Coagulation Profile: Recent Labs  Lab 12/03/17 0953  INR 1.51     CBG: Recent Labs  Lab 12/03/17 2255 12/04/17 0228 12/04/17 0327 12/04/17 0639 12/04/17 0759  GLUCAP 207* 147* 135* 140* 143*     Recent Results (from the past 240 hour(s))  Culture, Blood     Status: None (Preliminary result)   Collection Time: 12/02/17 12:51 PM  Result Value Ref Range Status   Specimen Description BLOOD LEFT HAND  Final   Special Requests   Final    BOTTLES DRAWN AEROBIC AND ANAEROBIC Blood Culture adequate volume   Culture   Final    NO GROWTH 2 DAYS Performed at [facility], [place] [address] [place], [place] [address]    Report Status PENDING  Incomplete  Culture, Blood     Status: Abnormal (Preliminary result)   Collection Time: 12/02/17 12:51 PM  Result Value Ref Range Status   Specimen Description BLOOD RIGHT HAND  Final   Special Requests   Final    BOTTLES DRAWN AEROBIC AND ANAEROBIC Blood Culture adequate volume   Culture  Setup Time   Final    GRAM NEGATIVE RODS ANAEROBIC BOTTLE ONLY Organism ID to follow Performed at [facility], [place] [address], [place], [place] [address]    Culture ESCHERICHIA COLI (A)  Final   Report Status PENDING  Incomplete  Blood Culture ID Panel (Reflexed)     Status: Abnormal   Collection Time: 12/02/17 12:51 PM  Result Value Ref Range Status   Enterococcus species NOT DETECTED NOT DETECTED Final   Listeria monocytogenes NOT DETECTED NOT DETECTED Final   Staphylococcus species NOT DETECTED NOT DETECTED Final   Staphylococcus aureus (BCID) NOT DETECTED NOT DETECTED Final   Streptococcus species NOT DETECTED NOT DETECTED Final   Streptococcus agalactiae NOT DETECTED NOT DETECTED Final   Streptococcus pneumoniae NOT DETECTED NOT DETECTED Final   Streptococcus pyogenes NOT DETECTED NOT DETECTED Final   Acinetobacter baumannii NOT DETECTED NOT DETECTED Final   Enterobacteriaceae species DETECTED (A) NOT DETECTED Final     Comment: Enterobacteriaceae represent a large family of gram-negative bacteria, not a single organism. CRITICAL RESULT CALLED TO, READ BACK BY AND VERIFIED WITH: E.WILLIAMSON,PHARMD AT 2145 ON 12/03/17 BY G.MCADOO    Enterobacter cloacae complex NOT DETECTED NOT DETECTED Final   Escherichia coli DETECTED (A) NOT DETECTED Final    Comment: CRITICAL RESULT CALLED TO, READ BACK BY AND VERIFIED WITH: E.WILLIAMSON,PHARMD AT 2145 ON 12/03/17 BY G.MCADOO    Klebsiella oxytoca NOT DETECTED NOT DETECTED Final   Klebsiella pneumoniae NOT DETECTED NOT DETECTED Final   Proteus species NOT DETECTED NOT DETECTED Final   Serratia marcescens NOT DETECTED NOT DETECTED Final   Carbapenem resistance NOT DETECTED NOT DETECTED Final   Haemophilus influenzae NOT DETECTED NOT DETECTED  Final   Neisseria meningitidis NOT DETECTED NOT DETECTED Final   Pseudomonas aeruginosa NOT DETECTED NOT DETECTED Final   Candida albicans NOT DETECTED NOT DETECTED Final   Candida glabrata NOT DETECTED NOT DETECTED Final   Candida krusei NOT DETECTED NOT DETECTED Final   Candida parapsilosis NOT DETECTED NOT DETECTED Final   Candida tropicalis NOT DETECTED NOT DETECTED Final    Comment: Performed at [facility], [place] [address] [place], [place] [address]  Culture, blood (routine x 2)     Status: None (Preliminary result)   Collection Time: 12/03/17  9:54 AM  Result Value Ref Range Status   Specimen Description   Final    LEFT ANTECUBITAL Performed at [place] [address], [place], [place] [address]    Special Requests   Final    BOTTLES DRAWN AEROBIC AND ANAEROBIC Blood Culture adequate volume Performed at [place] [facility] [address], [place], [place] [address]    Culture  Setup Time   Final    GRAM NEGATIVE RODS IN BOTH AEROBIC AND ANAEROBIC BOTTLES CRITICAL VALUE NOTED.  VALUE IS CONSISTENT WITH PREVIOUSLY REPORTED AND CALLED VALUE.    Culture   Final    GRAM  NEGATIVE RODS TOO YOUNG TO READ Performed at [facility], [place] [address], [place], [place] [address]    Report Status PENDING  Incomplete  Culture, blood (routine x 2)     Status: Abnormal (Preliminary result)   Collection Time: 12/03/17 10:44 AM  Result Value Ref Range Status   Specimen Description   Final    BLOOD LEFT HAND Performed at [place] [address] [place], [place] [address]    Special Requests   Final    BOTTLES DRAWN AEROBIC AND ANAEROBIC Blood Culture adequate volume Performed at [facility] [address] [place], [place] [address]    Culture  Setup Time   Final    GRAM NEGATIVE RODS IN BOTH AEROBIC AND ANAEROBIC BOTTLES Organism ID to follow CRITICAL RESULT CALLED TO, READ BACK BY AND VERIFIED WITH: E.WILLIAMSON,PHARMD AT 2145 ON 12/03/17 BY G.MCADOO    Culture (A)  Final    ESCHERICHIA COLI SUSCEPTIBILITIES TO FOLLOW Performed at [facility], [place] [address] [place], [place] [address]    Report Status PENDING  Incomplete  Blood Culture ID Panel (Reflexed)     Status: Abnormal   Collection Time: 12/03/17 10:44 AM  Result Value Ref Range Status   Enterococcus species NOT DETECTED NOT DETECTED Final   Listeria monocytogenes NOT DETECTED NOT DETECTED Final   Staphylococcus species NOT DETECTED NOT DETECTED Final   Staphylococcus aureus (BCID) NOT DETECTED NOT DETECTED Final   Streptococcus species NOT DETECTED NOT DETECTED Final   Streptococcus agalactiae NOT DETECTED NOT DETECTED Final   Streptococcus pneumoniae NOT DETECTED NOT DETECTED Final   Streptococcus pyogenes NOT DETECTED NOT DETECTED Final   Acinetobacter baumannii NOT DETECTED NOT DETECTED Final   Enterobacteriaceae species DETECTED (A) NOT DETECTED Corrected    Comment: Enterobacteriaceae represent a large family of gram-negative bacteria, not a single organism. CRITICAL RESULT CALLED TO, READ BACK BY AND VERIFIED  WITH: E.WILLIAMSON,PHARMD AT 2145 ON 12/03/17 BY G.MCADOO CORRECTED ON 11/27 AT 2249: PREVIOUSLY REPORTED AS DETECTED Enterobacteriaceae represent a large family of gram negative bacteria, not a single organism. CRITICAL RESULT CALLED TO, READ BACK BY AND VERIFIED WITH: H.BAIRD,PHARMD AT 2143 ON 12/03/17 BY  G.MCADOO    Enterobacter  cloacae complex NOT DETECTED NOT DETECTED Final   Escherichia coli DETECTED (A) NOT DETECTED Corrected    Comment: CRITICAL RESULT CALLED TO, READ BACK BY AND VERIFIED WITH: E.WILLIAMSON,PHARMD AT 2145 ON 12/03/17 BY G.MCADOO CORRECTED ON 11/27 AT 2249: PREVIOUSLY REPORTED AS DETECTED CRITICAL RESULT CALLED TO, READ BACK BY AND VERIFIED WITH: H.BAIRD,PHARMD AT 2143 ON 12/03/17 BY G.MCADOO    Klebsiella oxytoca NOT DETECTED NOT DETECTED Final   Klebsiella pneumoniae NOT DETECTED NOT DETECTED Final   Proteus species NOT DETECTED NOT DETECTED Final   Serratia marcescens NOT DETECTED NOT DETECTED Final   Haemophilus influenzae NOT DETECTED NOT DETECTED Final   Neisseria meningitidis NOT DETECTED NOT DETECTED Final   Pseudomonas aeruginosa NOT DETECTED NOT DETECTED Final   Candida albicans NOT DETECTED NOT DETECTED Final   Candida glabrata NOT DETECTED NOT DETECTED Final   Candida krusei NOT DETECTED NOT DETECTED Final   Candida parapsilosis NOT DETECTED NOT DETECTED Final   Candida tropicalis NOT DETECTED NOT DETECTED Final    Comment: Performed at [facility], [place] [address], [place], [place] [address]      Radiology Studies: Dg Chest 2 View  Result Date: 12/03/2017 CLINICAL DATA:  Fever.  Sepsis evaluation. EXAM: CHEST - 2 VIEW COMPARISON:  11/20/2017 FINDINGS: Patchy airspace disease noted posteriorly on the lateral at the lung base. I suspect this is likely within the right lower lobe and concerning for pneumonia. Heart is normal size. No effusions. No acute bony abnormality. IMPRESSION: Patchy posterior basilar opacity best seen on the lateral view,  likely in the right lung base concerning for right lower lobe pneumonia. Electronically Signed   By: Rolm Baptise M.D.   On: 12/03/2017 10:23   Ct Abdomen Pelvis W Contrast  Result Date: 12/03/2017 CLINICAL DATA:  62 y/o M; history of intrahepatic cholangiocarcinoma presenting with sepsis from gram-negative bacteremia. Suspected biliary infection. Biliary drain in situ. EXAM: CT ABDOMEN AND PELVIS WITH CONTRAST TECHNIQUE: Multidetector CT imaging of the abdomen and pelvis was performed using the standard protocol following bolus administration of intravenous contrast. CONTRAST:  124m OMNIPAQUE IOHEXOL 300 MG/ML  SOLN COMPARISON:  11/08/2017 CT abdomen and pelvis. FINDINGS: Lower chest: Stable round lung base mass spanning approximately 5.1 x 3.8 cm in left lung base 2.8 cm pulmonary nodule. Hepatobiliary: Liver cirrhosis. Internal external biliary drain is present traversing the right lobe of the liver, common bile duct, with pigtail in the second segment of the duodenum is stable in position from the prior CT of abdomen and pelvis. No associated rim enhancing abscess identified. Mild interval increase in perihepatic ascites. Stable ill-defined mass within the left liver hilum measuring approximately 5.1 x 2.2 cm and multiple additional hepatic metastases measuring up to 3.7 cm. Stable intrahepatic biliary ductal dilatation throughout the left lobe of liver. 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 Pancreas: Unremarkable. No pancreatic ductal dilatation or surrounding inflammatory changes. Spleen: Normal in size without focal abnormality. Adrenals/Urinary Tract: Stable bilateral adrenal metastasis. Left kidney lower pole 3 mm nonobstructing kidney stone is stable. No focal kidney lesion. No hydronephrosis. Normal bladder. Stomach/Bowel: No obstructive or inflammatory changes of the stomach, small bowel, or large bowel identified. Stable partial colectomy with patent anastomoses.  Vascular/Lymphatic: Calcific aortic atherosclerosis. Stable upper abdominal retroperitoneal, periportal, and gastrohepatic lymphadenopathy. Reproductive: Prostate calcifications. Other: Postsurgical changes within the anterior abdominal wall with scarring from prior surgery. No hernia. Stable left lower abdominal mesenteric mass measuring 3.5 x 2.2 cm. Musculoskeletal: No fracture is  seen. Stable chronic L5 pars defects and grade 1 L5-S1 anterolisthesis. Stable spondylosis of the spine with disc and facet degenerative changes greatest at L4-5 and L5-S1. IMPRESSION: 1. Stable position of internal external biliary drain. Stable intrahepatic biliary ductal dilatation of the left lobe. Stable left liver hilar mass and multiple hepatic metastases. No abscess identified. 2. Mild increase in upper abdominal ascites. 3. Stable pulmonary, adrenal, mesenteric, and retroperitoneal lymph node metastasis. Electronically Signed   By: Kristine Garbe M.D.   On: 12/03/2017 18:05   Dg Chest Port 1 View  Result Date: 12/03/2017 CLINICAL DATA:  Encounter for central line placement. EXAM: PORTABLE CHEST 1 VIEW 2:55 p.m. COMPARISON:  Chest x-ray dated 12/03/2017 at 10:16 a.m. FINDINGS: New right jugular vein catheter tip is at the cavoatrial junction 4.8 cm below the carina. No pneumothorax. Heart size and vascularity are normal. 2.5 cm mass at the left lung base posteriorly is barely visible on this radiograph. The ill-defined mass at the right lung base is poorly seen on this radiograph. IMPRESSION: Central venous catheter appears in good position. No acute abnormality. Faintly visible masses at both lung bases as previously demonstrated. Electronically Signed   By: Lorriane Shire M.D.   On: 12/03/2017 15:20   Ir Patient Eval Tech 0-60 Mins  Result Date: 12/02/2017 Chipper Oman     12/02/2017 12:46 PM Patient came in today with complaint of biliary tube not draining.  I evaluated the tube.  It flushed easily.  The  patient stated that he was unable to aspirate bile.  I placed a new drain bag to gravity and got minimal bloody bile bile draining into the bag.  I discussed this with Dr Barbie Banner.  I explained to the patient that he had an internal/ external biliary drain catheter so it was draining internal which is the desired goal. .  It flushed easily and the statlock and sutures were intact at the original position.  No images were requested.  The patient was advised to call us if any further problems arise.  Otherwise , he will return for a routine exchange at his scheduled interval.        Medications:  Scheduled: . celecoxib  200 mg Oral QPM  . enoxaparin (LOVENOX) injection  40 mg Subcutaneous Q24H  . [START ON 12/05/2017] gabapentin  400 mg Oral Daily  . gabapentin  800 mg Oral QPM  . lactose free nutrition  237 mL Oral TID WC  . potassium chloride  40 mEq Oral Once  . zolpidem  5 mg Oral QHS   Continuous: . dextrose 5 % and 0.9% NaCl 100 mL/hr at 12/04/17 0900  . norepinephrine (LEVOPHED) Adult infusion 3 mcg/min (12/04/17 0900)  . piperacillin-tazobactam (ZOSYN)  IV 12.5 mL/hr at 12/04/17 0900  . sodium chloride     JSH:FWYOVZCHYIFOY **OR** acetaminophen, ondansetron **OR** ondansetron (ZOFRAN) IV, oxyCODONE, polyvinyl alcohol, senna-docusate   Assessment/Plan:  Septic shock secondary to gram-negative bacteremia with lactic acidosis Most likely biliary source considering his previous history and presence of biliary drain.  CT scan of the abdomen shows that the drain is in good position.  Stable ductal dilatation and masses noted compared to previous imaging studies.  Patient was seen by critical care medicine.  Central line was placed.  Patient was placed on pressors.  This has been weaned down over the course of the night.  Blood pressures have improved.  Patient appears to be more stable from a clinical standpoint.  Continue with the Zosyn.  Hopefully his pressors will be weaned to off today.   Appreciate critical care assistance.  Lactic acid levels noted to have improved.  History of cholangiocarcinoma with metastases with presence of biliary drain Patient has had recurrent issues with his drain with bacteremia.  Medical oncology and interventional radiology following this patient.  Plan is for intervention early next week.  Since the drain is in stable position there does not appear to be any urgent need to intervene immediately.  He seems to be stabilizing from an infection standpoint as well.  Mild hyponatremia, due to hypovolemia Improved with IV fluids.  Continue to monitor periodically.  Normocytic anemia Baseline hemoglobin around 9-10.  Mildly lower than his baseline.  No evidence of overt bleeding.  Continue to monitor periodically.  Drop in hemoglobin could be dilutional.  Mild acute kidney injury Creatinine has improved.  Monitor urine output.  Diabetes mellitus type 2 with hyperglycemia/peripheral neuropathy HbA1c 4.9 on November 3.  Patient has had a few hypoglycemic episodes in the hospital which could be due to sepsis.  We will continue with D5 infusion.  Hold his insulin for now.  Check her CBGs 4 times daily.  Patient is on oral hypoglycemics at home which has been held.  Considering his low HbA1c may need to stop this altogether.  Continue gabapentin.  He is requesting that the Celebrex be also resumed.  Since his renal function has improved this is okay to do.  Essential hypertension Hypertensive as noted above.  Holding his lisinopril.  DVT Prophylaxis: Lovenox    Code Status: DNR Family Communication: Discussed with the patient.  No family at bedside Disposition Plan: Management as outlined above.    LOS: 1 day   [facility] Pager [phone] 12/04/2017, 10:55 AM  If 7PM-7AM, please contact night-coverage at [email or web address], password [facility]

## 2017-12-04 NOTE — Progress Notes (Signed)
Patient ID: Matthew Lewis, male   DOB: [birth year], 62 y.o.   MRN: [id number]   Known to IR Dr Kathlene Cote discussed this case with Dr Burr Medico on Wed.  Plan: PAC and Bili stent placement Mon/Tues after bacteremia has been treated.  Consideration:  If bacteremia not responding to treatment Would consider Bili drain exchange in IR Fri---  No sedation--- per Dr Kathlene Cote

## 2017-12-04 NOTE — Progress Notes (Signed)
NAME:  Matthew Lewis, MRN:  [id number], DOB:  [birth year], LOS: 1 ADMISSION DATE:  12/03/2017, CONSULTATION DATE: 12/03/2017 REFERRING MD: Lisbeth Ply, CHIEF COMPLAINT: Sepsis  Brief History   Patient with a history of cholangiocarcinoma, metastatic disease Admitted with sepsis Gram-negative organisms, likely source biliary drain Requiring pressors  History of present illness   Weakness and episode of vomiting on day of presentation 12/03/2017 He had return for follow-up today following having his drain flushed yesterday 11/26 He was in the hospital 1114 02/18/2016-was treated for hypotension, acute renal failure Biliary drainage was placed on 10/26/2017 Biopsy of the adrenal on 10/29/2017 did reveal metastatic adenocarcinoma  Feels better today  Past Medical History  History of diabetes Coronary artery disease Alcoholic cirrhosis Metastatic cholangiocarcinoma  Significant Hospital Events   Required central line placement yesterday On pressors at present with improved hemodynamics-still requiring Levophed  Consults:  PCCM 12/03/2017 Oncology  Procedures:  Central line placement  Significant Diagnostic Tests:  Chest x-ray shows no acute infiltrate, bibasal nodules CT scan of the chest 10/27/2017 bibasal nodules  Micro Data:   Blood cultures positive for E. Coli 12/03/2017, 12/02/2017 Antimicrobials:  Rocephin 12/02/2017 Zosyn 12/03/2017.>>  Interim history/subjective:  He is feeling better today-not as tired, slept well last night Voiding well   Objective   Blood pressure (!) 99/59, pulse 94, temperature 98.3 F (36.8 C), temperature source Oral, resp. rate 20, height 5' 11"  (1.803 m), weight 86 kg, SpO2 100 %.        Intake/Output Summary (Last 24 hours) at 12/04/2017 0946 Last data filed at 12/04/2017 0800 Gross per 24 hour  Intake 3895.29 ml  Output 4325 ml  Net -429.71 ml   Filed Weights   12/03/17 0930  Weight: 86 kg     Examination: General: Middle-aged gentleman, does not appear to be in distress HENT: Moist oral mucosa Lungs: Good entry bilaterally, few rales at the bases Cardiovascular: S1-S2 appreciated Abdomen: Bowel sounds appreciated, full nondistended Extremities: No edema, no clubbing Neuro: Alert oriented x3, moving all extremities  Resolved Hospital Problem list    Assessment & Plan:  Sepsis -Gram-negative sepsis -Still requiring pressors -Improved urine output -Afebrile overnight -Continue Zosyn  Gram-negative sepsis-E. Coli -Zosyn to cover organism -Await sensitivities  Metastatic cholangio-carcinoma -Advanced disease -Oncology on board  Hypotension -Improved with pressors  Hypoglycemia -Related to his septic status - Liver cirrhosis -Monitor enzymes   Wean pressors as able  Best practice:  Diet: As tolerated Pain/Anxiety/Delirium protocol (if indicated): On oxycodone DVT prophylaxis: On Lovenox Glucose control: Insulin coverage Mobility: As tolerated Code Status: DNR Family Communication: No family at bedside at present Disposition:   Labs   CBC: Recent Labs  Lab 12/01/17 1529 12/02/17 1252 12/03/17 0953 12/04/17 0846  WBC 12.3* 14.2* 7.8 8.3  NEUTROABS 9.3* 13.4* 7.2  --   HGB 12.4* 11.8* 10.4* 8.3*  HCT 39.5 36.8* 33.2* 26.2*  MCV 92.5 92.7 95.4 95.3  PLT 326 248 146* 125*    Basic Metabolic Panel: Recent Labs  Lab 12/01/17 1529 12/03/17 0953 12/03/17 1206 12/04/17 0846  NA 133* 128*  --  133*  K 4.8 4.1  --  3.5  CL 103 98  --  108  CO2 20* 16*  --  18*  GLUCOSE 126* 95  --  174*  BUN 16 25*  --  17  CREATININE 0.87 1.18 1.05 0.69  CALCIUM 10.3 9.1  --  8.2*   GFR: Estimated Creatinine Clearance: 99.4 mL/min (by C-G formula based  on SCr of 0.69 mg/dL). Recent Labs  Lab 12/01/17 1529 12/02/17 1252 12/03/17 0953 12/03/17 0958 12/03/17 1206 12/03/17 1620 12/04/17 0846  WBC 12.3* 14.2* 7.8  --   --   --  8.3  LATICACIDVEN   --   --   --  5.83* 3.7* 2.9*  --     Liver Function Tests: Recent Labs  Lab 12/01/17 1529 12/03/17 0953 12/04/17 0846  AST 54* 71* 61*  ALT 46* 51* 43  ALKPHOS 622* 368* 210*  BILITOT 1.8* 4.4* 3.1*  PROT 9.7* 8.4* 6.7  ALBUMIN 3.4* 3.2* 2.6*   No results for input(s): LIPASE, AMYLASE in the last 168 hours. No results for input(s): AMMONIA in the last 168 hours.  ABG    Component Value Date/Time   PHART 7.416 10/18/2008 2105   PCO2ART 41.6 10/18/2008 2105   PO2ART 216.0 (H) 10/18/2008 2105   HCO3 19.2 (L) 06/12/2016 1454   TCO2 27.5 10/18/2008 2105   ACIDBASEDEF 4.6 (H) 06/12/2016 1454   O2SAT 42.0 06/12/2016 1454     Coagulation Profile: Recent Labs  Lab 12/03/17 0953  INR 1.51    Cardiac Enzymes: No results for input(s): CKTOTAL, CKMB, CKMBINDEX, TROPONINI in the last 168 hours.  HbA1C: Hgb A1c MFr Bld  Date/Time Value Ref Range Status  11/09/2017 07:52 AM 4.9 4.8 - 5.6 % Final    Comment:    (NOTE) Pre diabetes:          5.7%-6.4% Diabetes:              >6.4% Glycemic control for   <7.0% adults with diabetes   10/27/2017 02:49 AM 6.5 (H) 4.8 - 5.6 % Final    Comment:    (NOTE)         Prediabetes: 5.7 - 6.4         Diabetes: >6.4         Glycemic control for adults with diabetes: <7.0     CBG: Recent Labs  Lab 12/03/17 2255 12/04/17 0228 12/04/17 0327 12/04/17 0639 12/04/17 0759  GLUCAP 207* 147* 135* 140* 143*    Review of Systems:   Review of Systems  Constitutional: Negative.   HENT: Negative.   Respiratory: Negative for cough and shortness of breath.   Cardiovascular: Negative for chest pain.  Gastrointestinal: Negative.  Negative for abdominal pain and nausea.  Genitourinary: Negative.   Skin: Negative.   All other systems reviewed and are negative.    Past Medical History  He,  has a past medical history of Abscess of anal and rectal regions, Alcoholic cirrhosis ([place]), Arthritis, Cirrhosis ([place]), Colon cancer ([place])  (05/2005), Cough, DM type 2 with diabetic peripheral neuropathy ([place]), Fatigue, Fistula, anal, History of substance abuse ([place]), Hypertension, PTSD (post-traumatic stress disorder), and Sleep deprivation.   Surgical History    Past Surgical History:  Procedure Laterality Date  . ABSCESS DRAINAGE     "probably 15-20 so far at least; rectal"  . ANAL FISTULECTOMY  12/13/1998  . ANTERIOR FUSION CERVICAL SPINE  2010   "triple"; "did 2 surgeries on the same day in 1607 due to complications"  . CORONARY ANGIOPLASTY  2001  . detached muscle  2010   right chest; "after cervical fusion complications"  . ELBOW SURGERY  2007   "cut out part of a muscle"; left  . EXAMINATION UNDER ANESTHESIA  03/22/2005   fistula  . EXAMINATION UNDER ANESTHESIA  06/06/2011   Procedure: EXAM UNDER ANESTHESIA;  Surgeon: Stark Klein, MD;  Location: [facility] OR;  Service: General;  Laterality: N/A;  . HEMICOLECTOMY  06/06/2005   left  . INCISION AND DRAINAGE PERIRECTAL ABSCESS  03/30/2005  . INCISION AND DRAINAGE PERIRECTAL ABSCESS  12/13/2004  . INCISION AND DRAINAGE PERIRECTAL ABSCESS  07/14/2001  . INCISION AND DRAINAGE PERIRECTAL ABSCESS  08/12/2010   horseshoe abscess; Dr Redmond Pulling  . INCISION AND DRAINAGE PERIRECTAL ABSCESS  06/06/2011   Procedure: IRRIGATION AND DEBRIDEMENT PERIRECTAL ABSCESS;  Surgeon: Stark Klein, MD;  Location: [facility];  Service: General;  Laterality: N/A;  . INCISION AND DRAINAGE PERIRECTAL ABSCESS N/A 02/24/2017   Procedure: IRRIGATION AND DEBRIDEMENT PERIRECTAL ABSCESS;  Surgeon: Ralene Ok, MD;  Location: [facility];  Service: General;  Laterality: N/A;  . IR EXCHANGE BILIARY DRAIN  11/07/2017  . IR INT EXT BILIARY DRAIN WITH CHOLANGIOGRAM  10/26/2017  . IR PATIENT EVAL TECH 0-60 MINS  12/02/2017  . IR RADIOLOGIST EVAL & MGMT  11/06/2017  . [place]  . PERCUTANEOUS PINNING PHALANX FRACTURE OF HAND  ~ 2008   "plates in 2 places"; right  . PLACEMENT OF SETON N/A 09/05/2017   Procedure:  PLACEMENT OF SETON;  Surgeon: Leighton Ruff, MD;  Location: [facility];  Service: General;  Laterality: N/A;  . RADIOLOGY WITH ANESTHESIA N/A 10/25/2017   Procedure: MRI WITH ANESTHESIA;  Surgeon: Radiologist, Medication, MD;  Location: [facility];  Service: Radiology;  Laterality: N/A;  . THORACIC DISCECTOMY  [phone]     Social History   reports that he quit smoking about 26 years ago. His smoking use included cigarettes. He has a 10.00 pack-year smoking history. He has never used smokeless tobacco. He reports that he drinks alcohol. He reports that he has current or past drug history. Drugs: Oxycodone and Marijuana.   Family History   His family history includes Cancer in his mother and sister; Heart disease in his brother, father, and mother.   Allergies Allergies  Allergen Reactions  . Morphine And Related Other (See Comments)    Patient is recovering from drug addiction and wants to avoid any narcotics     Home Medications  Prior to Admission medications   Medication Sig Start Date End Date Taking? Authorizing Provider  aspirin EC 81 MG tablet Take 81 mg by mouth daily.   Yes [provider]  atorvastatin (LIPITOR) 40 MG tablet Take 1 tablet (40 mg total) by mouth daily at 6 PM. 12/02/12  Yes Viyuoh, Adeline C, MD  calcium carbonate (TUMS - DOSED IN MG ELEMENTAL CALCIUM) 500 MG chewable tablet Chew 1 tablet by mouth 2 (two) times daily as needed for indigestion or heartburn.   Yes [provider]  celecoxib (CELEBREX) 200 MG capsule Take 200 mg by mouth at bedtime.  10/10/17  Yes [provider]  empagliflozin (JARDIANCE) 10 MG TABS tablet Take 10 mg by mouth at bedtime.    Yes [provider]  gabapentin (NEURONTIN) 400 MG capsule Take 400-800 mg by mouth See admin instructions. Take 400 mg by mouth in the morning and 800 mg in the evening   Yes [provider]  glipiZIDE (GLUCOTROL) 5 MG tablet Take 5 mg by mouth 2 (two) times  daily before a meal.   Yes [provider]  Insulin Detemir (LEVEMIR FLEXPEN) 100 UNIT/ML Pen Inject 10-62 Units into the skin See admin instructions. Take 62 units subq in the morning and 10 units in the evening   Yes [provider]  lisinopril (PRINIVIL,ZESTRIL) 20  MG tablet Take 10 mg by mouth daily.    Yes [provider]  metFORMIN (GLUCOPHAGE) 1000 MG tablet Take 1,000 mg by mouth 2 (two) times daily with a meal.   Yes [provider]  Multiple Vitamin (MULTIVITAMIN) tablet Take 1 tablet by mouth daily.   Yes [provider]  ondansetron (ZOFRAN) 8 MG tablet Take 1 tablet (8 mg total) by mouth 2 (two) times daily as needed. Start on the third day after chemotherapy. 11/06/17  Yes [name], MD  polyvinyl alcohol (LIQUIFILM TEARS) 1.4 % ophthalmic solution Place 1 drop into both eyes daily as needed for dry eyes.    Yes [provider]  prochlorperazine (COMPAZINE) 10 MG tablet Take 1 tablet (10 mg total) by mouth every 6 (six) hours as needed (Nausea or vomiting). 11/06/17  Yes [name], MD  oxyCODONE 10 MG TABS Take 1 tablet (10 mg total) by mouth every 4 (four) hours as needed for severe pain. Patient not taking: Reported on 12/03/2017 10/29/17   Nita Sells, MD     Critical care time: 30 minutes Time spent evaluating patient, formulating plan of care Risk of decompensation is still quite significant with his advanced disease and sepsis

## 2017-12-05 LAB — GLUCOSE, CAPILLARY
GLUCOSE-CAPILLARY: 154 mg/dL — AB (ref 70–99)
Glucose-Capillary: 128 mg/dL — ABNORMAL HIGH (ref 70–99)
Glucose-Capillary: 130 mg/dL — ABNORMAL HIGH (ref 70–99)
Glucose-Capillary: 153 mg/dL — ABNORMAL HIGH (ref 70–99)

## 2017-12-05 LAB — CBC
HCT: 26.7 % — ABNORMAL LOW (ref 39.0–52.0)
Hemoglobin: 8.4 g/dL — ABNORMAL LOW (ref 13.0–17.0)
MCH: 29.3 pg (ref 26.0–34.0)
MCHC: 31.5 g/dL (ref 30.0–36.0)
MCV: 93 fL (ref 80.0–100.0)
Platelets: 125 10*3/uL — ABNORMAL LOW (ref 150–400)
RBC: 2.87 MIL/uL — ABNORMAL LOW (ref 4.22–5.81)
RDW: 16.5 % — ABNORMAL HIGH (ref 11.5–15.5)
WBC: 7 10*3/uL (ref 4.0–10.5)
nRBC: 0 % (ref 0.0–0.2)

## 2017-12-05 LAB — CULTURE, BLOOD (ROUTINE X 2): Special Requests: ADEQUATE

## 2017-12-05 LAB — COMPREHENSIVE METABOLIC PANEL
ALT: 37 U/L (ref 0–44)
ANION GAP: 7 (ref 5–15)
AST: 43 U/L — ABNORMAL HIGH (ref 15–41)
Albumin: 2.5 g/dL — ABNORMAL LOW (ref 3.5–5.0)
Alkaline Phosphatase: 201 U/L — ABNORMAL HIGH (ref 38–126)
BUN: 12 mg/dL (ref 8–23)
CALCIUM: 8.2 mg/dL — AB (ref 8.9–10.3)
CO2: 18 mmol/L — AB (ref 22–32)
Chloride: 105 mmol/L (ref 98–111)
Creatinine, Ser: 0.52 mg/dL — ABNORMAL LOW (ref 0.61–1.24)
GFR calc Af Amer: >60 mL/min (ref >60)
GFR calc non Af Amer: >60 mL/min (ref >60)
Glucose, Bld: 145 mg/dL — ABNORMAL HIGH (ref 70–99)
Potassium: 3.7 mmol/L (ref 3.5–5.1)
Sodium: 130 mmol/L — ABNORMAL LOW (ref 135–145)
Total Bilirubin: 2.6 mg/dL — ABNORMAL HIGH (ref 0.3–1.2)
Total Protein: 6.7 g/dL (ref 6.5–8.1)

## 2017-12-05 LAB — CULTURE, BLOOD (SINGLE): Special Requests: ADEQUATE

## 2017-12-05 LAB — CORTISOL: Cortisol, Plasma: 14.8 ug/dL

## 2017-12-05 MED ORDER — SODIUM CHLORIDE 0.9% FLUSH
10.0000 mL | Freq: Two times a day (BID) | INTRAVENOUS | Status: DC
  Administered 2017-12-05 – 2017-12-10 (×4): 10 mL

## 2017-12-05 MED ORDER — LORATADINE 10 MG PO TABS
10.0000 mg | ORAL_TABLET | Freq: Every day | ORAL | Status: DC
  Administered 2017-12-05 – 2017-12-11 (×6): 10 mg via ORAL
  Filled 2017-12-05 (×6): qty 1

## 2017-12-05 MED ORDER — CHLORHEXIDINE GLUCONATE CLOTH 2 % EX PADS
6.0000 | MEDICATED_PAD | Freq: Every day | CUTANEOUS | Status: DC
  Administered 2017-12-05: 6 via TOPICAL

## 2017-12-05 MED ORDER — SODIUM CHLORIDE 0.9% FLUSH
10.0000 mL | INTRAVENOUS | Status: DC | PRN
  Administered 2017-12-09: 10 mL
  Filled 2017-12-05: qty 40

## 2017-12-05 MED ORDER — SODIUM CHLORIDE 0.9 % IV SOLN
1.0000 g | Freq: Three times a day (TID) | INTRAVENOUS | Status: DC
  Administered 2017-12-05 – 2017-12-09 (×13): 1 g via INTRAVENOUS
  Filled 2017-12-05 (×14): qty 1

## 2017-12-05 NOTE — Progress Notes (Signed)
[facility] PROGRESS NOTE  LARNIE HEART VEH:[id number] DOB: 22-Feb-1955 DOA: 12/03/2017  PCP: [name], MD  Brief History/Interval Summary:  62 y.o. male with medical history significant for intrahepatic cholangiocarcinoma, metastatic to liver and lung, type 2 diabetes, prior history of alcohol abuse, cirrhosis, status post percutaneous biliary drainage drainage tube for biliary obstruction who presented on 12/03/2017 with several days of rigors, fevers, nausea and vomiting and generalized malaise.  On 11/26 patient was seen by IR for biliary drain flushing.  On the same day he was seen by oncology.  At that time he had complained of abdominal pain at the site of where the drain was flushed, he had also reported significant amount of pressure during that procedure.  He was afebrile at that evaluation but found to have a WBC of 14.2, complaints of rigors, complaints of nausea and emesis x1.  Blood cultures x2 and urine cultures were obtained and he received one-time dose of Rocephin 2 g IV.  He was also given IV Zofran and 1 L of normal saline for suspected dehydration. He was sent home with Keflex due to his prior history of Klebsiella infection.  Cultures were positive for gram-negative bacteria.  Patient was hospitalized for further management.  Reason for Visit: Gram negative bacteremia.  Septic shock  Consultants: Medical oncology.  Interventional radiology.  Critical care medicine  Procedures: Central line placement in the right IJ  Antibiotics: Zosyn till 11/29 Meropenem starting 11/29  Subjective/Interval History: Patient states that overall he is feeling better.  Denies any abdominal pain nausea or vomiting.  No fever or chills.  Denies any dizziness or lightheadedness.    ROS: Denies any headaches  Objective:  Vital Signs  Vitals:   12/05/17 0600 12/05/17 0700 12/05/17 0730 12/05/17 0800  BP: 104/72 101/68 114/71   Pulse: 100 98 99   Resp: (!) 21 18 17     Temp:    97.8 F (36.6 C)  TempSrc:    Oral  SpO2: 99% 99% 99%   Weight:      Height:        Intake/Output Summary (Last 24 hours) at 12/05/2017 1006 Last data filed at 12/05/2017 0700 Gross per 24 hour  Intake 2711.4 ml  Output 3275 ml  Net -563.6 ml   Filed Weights   12/03/17 0930 12/05/17 0500  Weight: 86 kg 94.9 kg    General appearance: Alert.  Mildly jaundiced.  In no distress Resp: Normal effort at rest.  Diminished air entry at the bases.  No wheezing rales or rhonchi.   Cardio: S1-S2 is normal regular.  No S3-S4.  No rubs murmurs or bruit. GI: Abdomen is mildly distended.  Soft.  Nontender.  Biliary drain is noted.  Bowel sounds are present.  Hepatomegaly.   Neurologic: Alert and oriented x3.  No obvious focal neurological deficits.    Lab Results:  Data Reviewed: I have personally reviewed following labs and imaging studies  CBC: Recent Labs  Lab 12/01/17 1529 12/02/17 1252 12/03/17 0953 12/04/17 0846 12/05/17 0727  WBC 12.3* 14.2* 7.8 8.3 7.0  NEUTROABS 9.3* 13.4* 7.2  --   --   HGB 12.4* 11.8* 10.4* 8.3* 8.4*  HCT 39.5 36.8* 33.2* 26.2* 26.7*  MCV 92.5 92.7 95.4 95.3 93.0  PLT 326 248 146* 125* 125*    Basic Metabolic Panel: Recent Labs  Lab 12/01/17 1529 12/03/17 0953 12/03/17 1206 12/04/17 0846 12/05/17 0727  NA 133* 128*  --  133* 130*  K 4.8 4.1  --  3.5 3.7  CL 103 98  --  108 105  CO2 20* 16*  --  18* 18*  GLUCOSE 126* 95  --  174* 145*  BUN 16 25*  --  17 12  CREATININE 0.87 1.18 1.05 0.69 0.52*  CALCIUM 10.3 9.1  --  8.2* 8.2*    GFR: Estimated Creatinine Clearance: 109.6 mL/min (A) (by C-G formula based on SCr of 0.52 mg/dL (L)).  Liver Function Tests: Recent Labs  Lab 12/01/17 1529 12/03/17 0953 12/04/17 0846 12/05/17 0727  AST 54* 71* 61* 43*  ALT 46* 51* 43 37  ALKPHOS 622* 368* 210* 201*  BILITOT 1.8* 4.4* 3.1* 2.6*  PROT 9.7* 8.4* 6.7 6.7  ALBUMIN 3.4* 3.2* 2.6* 2.5*    Coagulation Profile: Recent Labs    Lab 12/03/17 0953  INR 1.51     CBG: Recent Labs  Lab 12/04/17 0759 12/04/17 1204 12/04/17 1639 12/04/17 2111 12/05/17 0801  GLUCAP 143* 129* 159* 157* 128*     Recent Results (from the past 240 hour(s))  Culture, Blood     Status: None (Preliminary result)   Collection Time: 12/02/17 12:51 PM  Result Value Ref Range Status   Specimen Description BLOOD LEFT HAND  Final   Special Requests   Final    BOTTLES DRAWN AEROBIC AND ANAEROBIC Blood Culture adequate volume   Culture   Final    NO GROWTH 3 DAYS Performed at [facility], [place] [address] [place], [place] [address]    Report Status PENDING  Incomplete  Culture, Blood     Status: Abnormal   Collection Time: 12/02/17 12:51 PM  Result Value Ref Range Status   Specimen Description BLOOD RIGHT HAND  Final   Special Requests   Final    BOTTLES DRAWN AEROBIC AND ANAEROBIC Blood Culture adequate volume   Culture  Setup Time   Final    GRAM NEGATIVE RODS ANAEROBIC BOTTLE ONLY Organism ID to follow    Culture (A)  Final    ESCHERICHIA COLI SUSCEPTIBILITIES PERFORMED ON PREVIOUS CULTURE WITHIN THE LAST 5 DAYS. Performed at [facility], [place] [address] [place], [place] [address]    Report Status 12/05/2017 FINAL  Final  Blood Culture ID Panel (Reflexed)     Status: Abnormal   Collection Time: 12/02/17 12:51 PM  Result Value Ref Range Status   Enterococcus species NOT DETECTED NOT DETECTED Final   Listeria monocytogenes NOT DETECTED NOT DETECTED Final   Staphylococcus species NOT DETECTED NOT DETECTED Final   Staphylococcus aureus (BCID) NOT DETECTED NOT DETECTED Final   Streptococcus species NOT DETECTED NOT DETECTED Final   Streptococcus agalactiae NOT DETECTED NOT DETECTED Final   Streptococcus pneumoniae NOT DETECTED NOT DETECTED Final   Streptococcus pyogenes NOT DETECTED NOT DETECTED Final   Acinetobacter baumannii NOT DETECTED NOT DETECTED Final   Enterobacteriaceae species DETECTED (A) NOT  DETECTED Final    Comment: Enterobacteriaceae represent a large family of gram-negative bacteria, not a single organism. CRITICAL RESULT CALLED TO, READ BACK BY AND VERIFIED WITH: E.WILLIAMSON,PHARMD AT 2145 ON 12/03/17 BY G.MCADOO    Enterobacter cloacae complex NOT DETECTED NOT DETECTED Final   Escherichia coli DETECTED (A) NOT DETECTED Final    Comment: CRITICAL RESULT CALLED TO, READ BACK BY AND VERIFIED WITH: E.WILLIAMSON,PHARMD AT 2145 ON 12/03/17 BY G.MCADOO    Klebsiella oxytoca NOT DETECTED NOT DETECTED Final   Klebsiella pneumoniae NOT DETECTED NOT DETECTED Final   Proteus species NOT DETECTED NOT DETECTED Final  Serratia marcescens NOT DETECTED NOT DETECTED Final   Carbapenem resistance NOT DETECTED NOT DETECTED Final   Haemophilus influenzae NOT DETECTED NOT DETECTED Final   Neisseria meningitidis NOT DETECTED NOT DETECTED Final   Pseudomonas aeruginosa NOT DETECTED NOT DETECTED Final   Candida albicans NOT DETECTED NOT DETECTED Final   Candida glabrata NOT DETECTED NOT DETECTED Final   Candida krusei NOT DETECTED NOT DETECTED Final   Candida parapsilosis NOT DETECTED NOT DETECTED Final   Candida tropicalis NOT DETECTED NOT DETECTED Final    Comment: Performed at [facility], [place] [address], [place], [place] [address]  Culture, blood (routine x 2)     Status: None (Preliminary result)   Collection Time: 12/03/17  9:54 AM  Result Value Ref Range Status   Specimen Description   Final    LEFT ANTECUBITAL Performed at [place] [address], [place], [place] [address]    Special Requests   Final    BOTTLES DRAWN AEROBIC AND ANAEROBIC Blood Culture adequate volume Performed at [place] [address]  [place], [place] [address]    Culture  Setup Time   Final    GRAM NEGATIVE RODS IN BOTH AEROBIC AND ANAEROBIC BOTTLES CRITICAL VALUE NOTED.  VALUE IS CONSISTENT WITH PREVIOUSLY REPORTED AND CALLED VALUE.    Culture    Final    GRAM NEGATIVE RODS IDENTIFICATION TO FOLLOW Performed at [facility], [place] [address] [place], [place] [address]    Report Status PENDING  Incomplete  Culture, blood (routine x 2)     Status: Abnormal   Collection Time: 12/03/17 10:44 AM  Result Value Ref Range Status   Specimen Description   Final    BLOOD LEFT HAND Performed at [place] [address] [place], [place] [address]    Special Requests   Final    BOTTLES DRAWN AEROBIC AND ANAEROBIC Blood Culture adequate volume Performed at [place] [address] [place], [place] [address]    Culture  Setup Time   Final    GRAM NEGATIVE RODS IN BOTH AEROBIC AND ANAEROBIC BOTTLES Organism ID to follow CRITICAL RESULT CALLED TO, READ BACK BY AND VERIFIED WITH: E.WILLIAMSON,PHARMD AT 2145 ON 12/03/17 BY G.MCADOO Performed at [facility], [place] [address], [place], [place] [address]    Culture (A)  Final    ESCHERICHIA COLI Confirmed Extended Spectrum Beta-Lactamase Producer (ESBL).  In bloodstream infections from ESBL organisms, carbapenems are preferred over piperacillin/tazobactam. They are shown to have a lower risk of mortality.    Report Status 12/05/2017 FINAL  Final   Organism ID, Bacteria ESCHERICHIA COLI  Final      Susceptibility   Escherichia coli - MIC*    AMPICILLIN >=32 RESISTANT Resistant     CEFAZOLIN >=64 RESISTANT Resistant     CEFEPIME RESISTANT Resistant     CEFTAZIDIME >=64 RESISTANT Resistant     CEFTRIAXONE >=64 RESISTANT Resistant     CIPROFLOXACIN 1 SENSITIVE Sensitive     GENTAMICIN <=1 SENSITIVE Sensitive     IMIPENEM 1 SENSITIVE Sensitive     TRIMETH/SULFA <=20 SENSITIVE Sensitive     AMPICILLIN/SULBACTAM >=32 RESISTANT Resistant     PIP/TAZO >=128 RESISTANT Resistant     Extended ESBL POSITIVE Resistant     * ESCHERICHIA COLI  Blood Culture ID Panel (Reflexed)     Status: Abnormal   Collection Time: 12/03/17 10:44 AM  Result  Value Ref Range  Status   Enterococcus species NOT DETECTED NOT DETECTED Final   Listeria monocytogenes NOT DETECTED NOT DETECTED Final   Staphylococcus species NOT DETECTED NOT DETECTED Final   Staphylococcus aureus (BCID) NOT DETECTED NOT DETECTED Final   Streptococcus species NOT DETECTED NOT DETECTED Final   Streptococcus agalactiae NOT DETECTED NOT DETECTED Final   Streptococcus pneumoniae NOT DETECTED NOT DETECTED Final   Streptococcus pyogenes NOT DETECTED NOT DETECTED Final   Acinetobacter baumannii NOT DETECTED NOT DETECTED Final   Enterobacteriaceae species DETECTED (A) NOT DETECTED Corrected    Comment: Enterobacteriaceae represent a large family of gram-negative bacteria, not a single organism. CRITICAL RESULT CALLED TO, READ BACK BY AND VERIFIED WITH: E.WILLIAMSON,PHARMD AT 2145 ON 12/03/17 BY G.MCADOO CORRECTED ON 11/27 AT 2249: PREVIOUSLY REPORTED AS DETECTED Enterobacteriaceae represent a large family of gram negative bacteria, not a single organism. CRITICAL RESULT CALLED TO, READ BACK BY AND VERIFIED WITH: H.BAIRD,PHARMD AT 2143 ON 12/03/17 BY  G.MCADOO    Enterobacter cloacae complex NOT DETECTED NOT DETECTED Final   Escherichia coli DETECTED (A) NOT DETECTED Corrected    Comment: CRITICAL RESULT CALLED TO, READ BACK BY AND VERIFIED WITH: E.WILLIAMSON,PHARMD AT 2145 ON 12/03/17 BY G.MCADOO CORRECTED ON 11/27 AT 2249: PREVIOUSLY REPORTED AS DETECTED CRITICAL RESULT CALLED TO, READ BACK BY AND VERIFIED WITH: H.BAIRD,PHARMD AT 2143 ON 12/03/17 BY G.MCADOO    Klebsiella oxytoca NOT DETECTED NOT DETECTED Final   Klebsiella pneumoniae NOT DETECTED NOT DETECTED Final   Proteus species NOT DETECTED NOT DETECTED Final   Serratia marcescens NOT DETECTED NOT DETECTED Final   Haemophilus influenzae NOT DETECTED NOT DETECTED Final   Neisseria meningitidis NOT DETECTED NOT DETECTED Final   Pseudomonas aeruginosa NOT DETECTED NOT DETECTED Final   Candida albicans NOT DETECTED NOT  DETECTED Final   Candida glabrata NOT DETECTED NOT DETECTED Final   Candida krusei NOT DETECTED NOT DETECTED Final   Candida parapsilosis NOT DETECTED NOT DETECTED Final   Candida tropicalis NOT DETECTED NOT DETECTED Final    Comment: Performed at [facility], [place] [address] [place], [place] [address]      Radiology Studies: Dg Chest 2 View  Result Date: 12/03/2017 CLINICAL DATA:  Fever.  Sepsis evaluation. EXAM: CHEST - 2 VIEW COMPARISON:  11/20/2017 FINDINGS: Patchy airspace disease noted posteriorly on the lateral at the lung base. I suspect this is likely within the right lower lobe and concerning for pneumonia. Heart is normal size. No effusions. No acute bony abnormality. IMPRESSION: Patchy posterior basilar opacity best seen on the lateral view, likely in the right lung base concerning for right lower lobe pneumonia. Electronically Signed   By: Rolm Baptise M.D.   On: 12/03/2017 10:23   Ct Abdomen Pelvis W Contrast  Result Date: 12/03/2017 CLINICAL DATA:  62 y/o M; history of intrahepatic cholangiocarcinoma presenting with sepsis from gram-negative bacteremia. Suspected biliary infection. Biliary drain in situ. EXAM: CT ABDOMEN AND PELVIS WITH CONTRAST TECHNIQUE: Multidetector CT imaging of the abdomen and pelvis was performed using the standard protocol following bolus administration of intravenous contrast. CONTRAST:  112m OMNIPAQUE IOHEXOL 300 MG/ML  SOLN COMPARISON:  11/08/2017 CT abdomen and pelvis. FINDINGS: Lower chest: Stable round lung base mass spanning approximately 5.1 x 3.8 cm in left lung base 2.8 cm pulmonary nodule. Hepatobiliary: Liver cirrhosis. Internal external biliary drain is present traversing the right lobe of the liver, common bile duct, with pigtail in the second segment of the duodenum is stable in position from the prior CT of  abdomen and pelvis. No associated rim enhancing abscess identified. Mild interval increase in perihepatic ascites. Stable  ill-defined mass within the left liver hilum measuring approximately 5.1 x 2.2 cm and multiple additional hepatic metastases measuring up to 3.7 cm. Stable intrahepatic biliary ductal dilatation throughout the left lobe of liver. 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 Pancreas: Unremarkable. No pancreatic ductal dilatation or surrounding inflammatory changes. Spleen: Normal in size without focal abnormality. Adrenals/Urinary Tract: Stable bilateral adrenal metastasis. Left kidney lower pole 3 mm nonobstructing kidney stone is stable. No focal kidney lesion. No hydronephrosis. Normal bladder. Stomach/Bowel: No obstructive or inflammatory changes of the stomach, small bowel, or large bowel identified. Stable partial colectomy with patent anastomoses. Vascular/Lymphatic: Calcific aortic atherosclerosis. Stable upper abdominal retroperitoneal, periportal, and gastrohepatic lymphadenopathy. Reproductive: Prostate calcifications. Other: Postsurgical changes within the anterior abdominal wall with scarring from prior surgery. No hernia. Stable left lower abdominal mesenteric mass measuring 3.5 x 2.2 cm. Musculoskeletal: No fracture is seen. Stable chronic L5 pars defects and grade 1 L5-S1 anterolisthesis. Stable spondylosis of the spine with disc and facet degenerative changes greatest at L4-5 and L5-S1. IMPRESSION: 1. Stable position of internal external biliary drain. Stable intrahepatic biliary ductal dilatation of the left lobe. Stable left liver hilar mass and multiple hepatic metastases. No abscess identified. 2. Mild increase in upper abdominal ascites. 3. Stable pulmonary, adrenal, mesenteric, and retroperitoneal lymph node metastasis. Electronically Signed   By: Kristine Garbe M.D.   On: 12/03/2017 18:05   Dg Chest Port 1 View  Result Date: 12/03/2017 CLINICAL DATA:  Encounter for central line placement. EXAM: PORTABLE CHEST 1 VIEW 2:55 p.m. COMPARISON:  Chest x-ray dated  12/03/2017 at 10:16 a.m. FINDINGS: New right jugular vein catheter tip is at the cavoatrial junction 4.8 cm below the carina. No pneumothorax. Heart size and vascularity are normal. 2.5 cm mass at the left lung base posteriorly is barely visible on this radiograph. The ill-defined mass at the right lung base is poorly seen on this radiograph. IMPRESSION: Central venous catheter appears in good position. No acute abnormality. Faintly visible masses at both lung bases as previously demonstrated. Electronically Signed   By: Lorriane Shire M.D.   On: 12/03/2017 15:20     Medications:  Scheduled: . celecoxib  200 mg Oral QPM  . Chlorhexidine Gluconate Cloth  6 each Topical Daily  . enoxaparin (LOVENOX) injection  40 mg Subcutaneous Q24H  . gabapentin  400 mg Oral Daily  . gabapentin  800 mg Oral QPM  . lactose free nutrition  237 mL Oral TID WC  . sodium chloride flush  10-40 mL Intracatheter Q12H  . zolpidem  5 mg Oral QHS   Continuous: . dextrose 5 % and 0.9% NaCl 100 mL/hr at 12/05/17 0700  . meropenem (MERREM) IV    . norepinephrine (LEVOPHED) Adult infusion 2 mcg/min (12/05/17 0700)  . sodium chloride     CVE:[id number] **OR** acetaminophen, ondansetron **OR** ondansetron (ZOFRAN) IV, oxyCODONE, polyvinyl alcohol, senna-docusate, sodium chloride flush    Assessment/Plan:  Septic shock secondary to ESBL E. coli bacteremia with lactic acidosis Most likely biliary source considering his previous history and presence of biliary drain.  CT scan of the abdomen shows that the drain is in good position.  Stable ductal dilatation and masses noted compared to previous imaging studies.  Patient was seen by critical care medicine.  Central line was placed.  Patient was placed on pressors.  Blood pressures have improved.  He was weaned off  of Levophed yesterday evening but blood pressure dropped into the 60s overnight so he was placed back on 2 mcg of Levophed.  Hopefully this can be weaned off  today.  Cortisol level 14.8.  Appreciate critical care assistance.  The blood culture is growing ESBL E. coli.  We will change the Zosyn to meropenem.  Lactic acid levels have improved.    History of cholangiocarcinoma with metastases with presence of biliary drain Patient has had recurrent issues with his drain with bacteremia.  Medical oncology and interventional radiology following this patient.  Plan is for intervention early next week.  Since the drain is in stable position, there does not appear to be any urgent need to intervene immediately.  He seems to be stabilizing from an infection standpoint as well.  LFTs are stable.  Mild hyperbilirubinemia is noted.  Alkaline phosphatase is also mildly elevated.  Mild hyponatremia, due to hypovolemia Sodium level remains low but stable.  Continue to monitor.    Normocytic anemia Baseline hemoglobin around 9-10.  Drop in hemoglobin likely dilutional.  No evidence of overt bleeding.  Stable this morning.   Mild acute kidney injury Renal function is back to baseline.  Monitor urine output.  Diabetes mellitus type 2 with hyperglycemia/peripheral neuropathy HbA1c 4.9 on November 3.  Patient has had a few hypoglycemic episodes in the hospital which could be due to sepsis.  He is on a regular diet.  Cut back on IV fluids.  CBG stable.  No further episodes of hypoglycemia.  Patient is on oral hypoglycemics at home which has been held.  Considering his low HbA1c may need to stop this altogether.  Continue gabapentin.  Patient also on Celebrex which he requested be initiated.  Essential hypertension Hypotensive as noted above.  Holding his lisinopril.  DVT Prophylaxis: Lovenox    Code Status: DNR Family Communication: Discussed with the patient.  No family at bedside Disposition Plan: Management as outlined above.  Hopefully will be weaned off of pressors today.    LOS: 2 days   [facility] Pager [phone] 12/05/2017, 10:06  AM  If 7PM-7AM, please contact night-coverage at [email or web address], password [facility]

## 2017-12-05 NOTE — Progress Notes (Signed)
NAME:  Matthew Lewis, MRN:  [id number], DOB:  1955/07/23, LOS: 2 ADMISSION DATE:  12/03/2017, CONSULTATION DATE: 12/03/2017 REFERRING MD: Lisbeth Ply, CHIEF COMPLAINT: Sepsis  Brief History   Patient with a history of cholangiocarcinoma, metastatic disease Admitted with sepsis Gram-negative organisms, likely source biliary drain Requiring pressors  History of present illness   Weakness and episode of vomiting on day of presentation 12/03/2017 He had return for follow-up today following having his drain flushed yesterday 11/26 He was in the hospital 1114 02/18/2016-was treated for hypotension, acute renal failure Biliary drainage was placed on 10/26/2017 Biopsy of the adrenal on 10/29/2017 did reveal metastatic adenocarcinoma  Feels better today He was taken of Levophed a few minutes ago  Past Medical History  History of diabetes Coronary artery disease Alcoholic cirrhosis Metastatic cholangiocarcinoma  Significant Hospital Events   Required central line placement 12/03/17 Hemodynamics-improving  Consults:  PCCM 12/03/2017 Oncology  Procedures:  Central line placement  Significant Diagnostic Tests:  Chest x-ray shows no acute infiltrate, bibasal nodules CT scan of the chest 10/27/2017 bibasal nodules  Micro Data:   Blood cultures positive for E. Coli 12/03/2017, 12/02/2017 Antimicrobials:  Rocephin 12/02/2017 Zosyn 12/03/2017.>> Meropenem 12/05/17>>  Interim history/subjective:  He is feeling better today-not as tired, slept well last night Voiding well   Objective   Blood pressure 113/67, pulse (!) 103, temperature 97.8 F (36.6 C), temperature source Oral, resp. rate (!) 22, height 5\' 11"  (1.803 m), weight 94.9 kg, SpO2 100 %.        Intake/Output Summary (Last 24 hours) at 12/05/2017 1053 Last data filed at 12/05/2017 1000 Gross per 24 hour  Intake 3001.61 ml  Output 3675 ml  Net -673.39 ml   Filed Weights   12/03/17 0930 12/05/17 0500    Weight: 86 kg 94.9 kg    Examination: General: Comfortable, not in distress HENT: Moist oral mucosa, neck supple Lungs: Good entry bilaterally, no rales Cardiovascular: S1-S2 appreciated, no murmur Abdomen: Bowel sounds appreciated abdomen nondistended Extremities: No clubbing, no edema Neuro: Open all extremities  Resolved Hospital Problem list    Assessment & Plan:  Sepsis -Gram-negative sepsis -E. coli sepsis -On pressors, being weaned -Improved urine output -Afebrile overnight -Meropenem initiated  Gram-negative sepsis-E. Coli -On meropenem day 1  Metastatic cholangio-carcinoma -Advanced disease, oncology following  Hypoglycemia -Related to his septic status -Stabilizing - Liver cirrhosis -Monitor enzymes  Best practice:  Diet: As tolerated Pain/Anxiety/Delirium protocol (if indicated): Oxycodone  DVT prophylaxis: Lovenox Glucose control: Insulin coverage Mobility: As tolerated Code Status: DNR Family Communication: No family at bedside at present Disposition:   Labs   CBC: Recent Labs  Lab 12/01/17 1529 12/02/17 1252 12/03/17 0953 12/04/17 0846 12/05/17 0727  WBC 12.3* 14.2* 7.8 8.3 7.0  NEUTROABS 9.3* 13.4* 7.2  --   --   HGB 12.4* 11.8* 10.4* 8.3* 8.4*  HCT 39.5 36.8* 33.2* 26.2* 26.7*  MCV 92.5 92.7 95.4 95.3 93.0  PLT 326 248 146* 125* 125*    Basic Metabolic Panel: Recent Labs  Lab 12/01/17 1529 12/03/17 0953 12/03/17 1206 12/04/17 0846 12/05/17 0727  NA 133* 128*  --  133* 130*  K 4.8 4.1  --  3.5 3.7  CL 103 98  --  108 105  CO2 20* 16*  --  18* 18*  GLUCOSE 126* 95  --  174* 145*  BUN 16 25*  --  17 12  CREATININE 0.87 1.18 1.05 0.69 0.52*  CALCIUM 10.3 9.1  --  8.2* 8.2*  GFR: Estimated Creatinine Clearance: 109.6 mL/min (A) (by C-G formula based on SCr of 0.52 mg/dL (L)). Recent Labs  Lab 12/02/17 1252 12/03/17 0953 12/03/17 0958 12/03/17 1206 12/03/17 1620 12/04/17 0846 12/05/17 0727  WBC 14.2* 7.8  --   --    --  8.3 7.0  LATICACIDVEN  --   --  5.83* 3.7* 2.9*  --   --     Liver Function Tests: Recent Labs  Lab 12/01/17 1529 12/03/17 0953 12/04/17 0846 12/05/17 0727  AST 54* 71* 61* 43*  ALT 46* 51* 43 37  ALKPHOS 622* 368* 210* 201*  BILITOT 1.8* 4.4* 3.1* 2.6*  PROT 9.7* 8.4* 6.7 6.7  ALBUMIN 3.4* 3.2* 2.6* 2.5*   No results for input(s): LIPASE, AMYLASE in the last 168 hours. No results for input(s): AMMONIA in the last 168 hours.  ABG    Component Value Date/Time   PHART 7.416 10/18/2008 2105   PCO2ART 41.6 10/18/2008 2105   PO2ART 216.0 (H) 10/18/2008 2105   HCO3 19.2 (L) 06/12/2016 1454   TCO2 27.5 10/18/2008 2105   ACIDBASEDEF 4.6 (H) 06/12/2016 1454   O2SAT 42.0 06/12/2016 1454     Coagulation Profile: Recent Labs  Lab 12/03/17 0953  INR 1.51    Cardiac Enzymes: No results for input(s): CKTOTAL, CKMB, CKMBINDEX, TROPONINI in the last 168 hours.  HbA1C: Hgb A1c MFr Bld  Date/Time Value Ref Range Status  11/09/2017 07:52 AM 4.9 4.8 - 5.6 % Final    Comment:    (NOTE) Pre diabetes:          5.7%-6.4% Diabetes:              >6.4% Glycemic control for   <7.0% adults with diabetes   10/27/2017 02:49 AM 6.5 (H) 4.8 - 5.6 % Final    Comment:    (NOTE)         Prediabetes: 5.7 - 6.4         Diabetes: >6.4         Glycemic control for adults with diabetes: <7.0     CBG: Recent Labs  Lab 12/04/17 0759 12/04/17 1204 12/04/17 1639 12/04/17 2111 12/05/17 0801  GLUCAP 143* 129* 159* 157* 128*    Review of Systems:   Review of Systems  Constitutional: Negative.   HENT: Negative.   Respiratory: Negative for cough and shortness of breath.   Cardiovascular: Negative for chest pain and leg swelling.  Skin: Negative.   All other systems reviewed and are negative.    Past Medical History  He,  has a past medical history of Abscess of anal and rectal regions, Alcoholic cirrhosis ([place]), Arthritis, Cirrhosis ([place]), Colon cancer ([place]) (05/2005), Cough,  DM type 2 with diabetic peripheral neuropathy ([place]), Fatigue, Fistula, anal, History of substance abuse ([place]), Hypertension, PTSD (post-traumatic stress disorder), and Sleep deprivation.   Surgical History    Past Surgical History:  Procedure Laterality Date  . ABSCESS DRAINAGE     "probably 15-20 so far at least; rectal"  . ANAL FISTULECTOMY  12/13/1998  . ANTERIOR FUSION CERVICAL SPINE  2010   "triple"; "did 2 surgeries on the same day in 5409 due to complications"  . CORONARY ANGIOPLASTY  2001  . detached muscle  2010   right chest; "after cervical fusion complications"  . ELBOW SURGERY  2007   "cut out part of a muscle"; left  . EXAMINATION UNDER ANESTHESIA  03/22/2005   fistula  . EXAMINATION UNDER ANESTHESIA  06/06/2011   Procedure:  EXAM UNDER ANESTHESIA;  Surgeon: Stark Klein, MD;  Location: [facility];  Service: General;  Laterality: N/A;  . HEMICOLECTOMY  06/06/2005   left  . INCISION AND DRAINAGE PERIRECTAL ABSCESS  03/30/2005  . INCISION AND DRAINAGE PERIRECTAL ABSCESS  12/13/2004  . INCISION AND DRAINAGE PERIRECTAL ABSCESS  07/14/2001  . INCISION AND DRAINAGE PERIRECTAL ABSCESS  08/12/2010   horseshoe abscess; Dr Redmond Pulling  . INCISION AND DRAINAGE PERIRECTAL ABSCESS  06/06/2011   Procedure: IRRIGATION AND DEBRIDEMENT PERIRECTAL ABSCESS;  Surgeon: Stark Klein, MD;  Location: [place];  Service: General;  Laterality: N/A;  . INCISION AND DRAINAGE PERIRECTAL ABSCESS N/A 02/24/2017   Procedure: IRRIGATION AND DEBRIDEMENT PERIRECTAL ABSCESS;  Surgeon: Ralene Ok, MD;  Location: [place];  Service: General;  Laterality: N/A;  . IR EXCHANGE BILIARY DRAIN  11/07/2017  . IR INT EXT BILIARY DRAIN WITH CHOLANGIOGRAM  10/26/2017  . IR PATIENT EVAL TECH 0-60 MINS  12/02/2017  . IR RADIOLOGIST EVAL & MGMT  11/06/2017  . [place]  . PERCUTANEOUS PINNING PHALANX FRACTURE OF HAND  ~ 2008   "plates in 2 places"; right  . PLACEMENT OF SETON N/A 09/05/2017   Procedure: PLACEMENT OF  SETON;  Surgeon: Leighton Ruff, MD;  Location: [facility];  Service: General;  Laterality: N/A;  . RADIOLOGY WITH ANESTHESIA N/A 10/25/2017   Procedure: MRI WITH ANESTHESIA;  Surgeon: Radiologist, Medication, MD;  Location: [place];  Service: Radiology;  Laterality: N/A;  . THORACIC DISCECTOMY  [phone]     Social History   reports that he quit smoking about 26 years ago. His smoking use included cigarettes. He has a 10.00 pack-year smoking history. He has never used smokeless tobacco. He reports that he drinks alcohol. He reports that he has current or past drug history. Drugs: Oxycodone and Marijuana.   Family History   His family history includes Cancer in his mother and sister; Heart disease in his brother, father, and mother.   Allergies Allergies  Allergen Reactions  . Morphine And Related Other (See Comments)    Patient is recovering from drug addiction and wants to avoid any narcotics     Home Medications  Prior to Admission medications   Medication Sig Start Date End Date Taking? Authorizing Provider  aspirin EC 81 MG tablet Take 81 mg by mouth daily.   Yes [provider]  atorvastatin (LIPITOR) 40 MG tablet Take 1 tablet (40 mg total) by mouth daily at 6 PM. 12/02/12  Yes Viyuoh, Adeline C, MD  calcium carbonate (TUMS - DOSED IN MG ELEMENTAL CALCIUM) 500 MG chewable tablet Chew 1 tablet by mouth 2 (two) times daily as needed for indigestion or heartburn.   Yes [provider]  celecoxib (CELEBREX) 200 MG capsule Take 200 mg by mouth at bedtime.  10/10/17  Yes [provider]  empagliflozin (JARDIANCE) 10 MG TABS tablet Take 10 mg by mouth at bedtime.    Yes [provider]  gabapentin (NEURONTIN) 400 MG capsule Take 400-800 mg by mouth See admin instructions. Take 400 mg by mouth in the morning and 800 mg in the evening   Yes [provider]  glipiZIDE (GLUCOTROL) 5 MG tablet Take 5 mg by mouth 2 (two) times daily before  a meal.   Yes [provider]  Insulin Detemir (LEVEMIR FLEXPEN) 100 UNIT/ML Pen Inject 10-62 Units into the skin See admin instructions. Take 62 units subq in the morning and 10 units in the evening  Yes [provider]  lisinopril (PRINIVIL,ZESTRIL) 20 MG tablet Take 10 mg by mouth daily.    Yes [provider]  metFORMIN (GLUCOPHAGE) 1000 MG tablet Take 1,000 mg by mouth 2 (two) times daily with a meal.   Yes [provider]  Multiple Vitamin (MULTIVITAMIN) tablet Take 1 tablet by mouth daily.   Yes [provider]  ondansetron (ZOFRAN) 8 MG tablet Take 1 tablet (8 mg total) by mouth 2 (two) times daily as needed. Start on the third day after chemotherapy. 11/06/17  Yes [name], MD  polyvinyl alcohol (LIQUIFILM TEARS) 1.4 % ophthalmic solution Place 1 drop into both eyes daily as needed for dry eyes.    Yes [provider]  prochlorperazine (COMPAZINE) 10 MG tablet Take 1 tablet (10 mg total) by mouth every 6 (six) hours as needed (Nausea or vomiting). 11/06/17  Yes [name], MD  oxyCODONE 10 MG TABS Take 1 tablet (10 mg total) by mouth every 4 (four) hours as needed for severe pain. Patient not taking: Reported on 12/03/2017 10/29/17   Nita Sells, MD

## 2017-12-05 NOTE — Progress Notes (Signed)
Patient ID: Matthew Lewis, male   DOB: 1955-12-24, 62 y.o.   MRN: [id number] Patient feeling better today.  Currently off pressors.  BP 104/63.  Afebrile.  WBC normal. Biliary drain output 400 cc. Blood cx- e coli/enterobacter. On IV meropenem.  Case discussed with Dr. Kathlene Cote. Plan at this time is for attempted biliary stent and port a cath placements on 12/3 with Dr. Kathlene Cote as long as pt remains stable/bacteremia treated. Above d/w pt/family. Will reassess early next week.

## 2017-12-06 DIAGNOSIS — N179 Acute kidney failure, unspecified: Secondary | ICD-10-CM

## 2017-12-06 LAB — COMPREHENSIVE METABOLIC PANEL
ALT: 31 U/L (ref 0–44)
AST: 30 U/L (ref 15–41)
Albumin: 2.2 g/dL — ABNORMAL LOW (ref 3.5–5.0)
Alkaline Phosphatase: 203 U/L — ABNORMAL HIGH (ref 38–126)
Anion gap: 7 (ref 5–15)
BUN: 11 mg/dL (ref 8–23)
CO2: 20 mmol/L — AB (ref 22–32)
Calcium: 8.1 mg/dL — ABNORMAL LOW (ref 8.9–10.3)
Chloride: 107 mmol/L (ref 98–111)
Creatinine, Ser: 0.56 mg/dL — ABNORMAL LOW (ref 0.61–1.24)
GFR calc Af Amer: >60 mL/min (ref >60)
GFR calc non Af Amer: >60 mL/min (ref >60)
GLUCOSE: 130 mg/dL — AB (ref 70–99)
Potassium: 3.9 mmol/L (ref 3.5–5.1)
SODIUM: 134 mmol/L — AB (ref 135–145)
Total Bilirubin: 2.2 mg/dL — ABNORMAL HIGH (ref 0.3–1.2)
Total Protein: 6.4 g/dL — ABNORMAL LOW (ref 6.5–8.1)

## 2017-12-06 LAB — GLUCOSE, CAPILLARY
GLUCOSE-CAPILLARY: 107 mg/dL — AB (ref 70–99)
Glucose-Capillary: 125 mg/dL — ABNORMAL HIGH (ref 70–99)
Glucose-Capillary: 119 mg/dL — ABNORMAL HIGH (ref 70–99)
Glucose-Capillary: 125 mg/dL — ABNORMAL HIGH (ref 70–99)

## 2017-12-06 MED ORDER — SODIUM CHLORIDE 0.9% FLUSH
10.0000 mL | INTRAVENOUS | Status: DC | PRN
  Administered 2017-12-06 – 2017-12-07 (×2): 20 mL
  Filled 2017-12-06 (×2): qty 40

## 2017-12-06 MED ORDER — ZOLPIDEM TARTRATE 5 MG PO TABS
5.0000 mg | ORAL_TABLET | Freq: Every evening | ORAL | Status: DC | PRN
  Administered 2017-12-06 – 2017-12-10 (×5): 5 mg via ORAL
  Filled 2017-12-06 (×5): qty 1

## 2017-12-06 MED ORDER — SODIUM CHLORIDE 0.9% FLUSH
10.0000 mL | Freq: Two times a day (BID) | INTRAVENOUS | Status: DC
  Administered 2017-12-08 – 2017-12-10 (×2): 10 mL

## 2017-12-06 MED ORDER — OXYCODONE HCL 5 MG PO TABS
5.0000 mg | ORAL_TABLET | ORAL | Status: DC | PRN
  Administered 2017-12-06 – 2017-12-09 (×3): 5 mg via ORAL
  Filled 2017-12-06 (×3): qty 1

## 2017-12-06 NOTE — Progress Notes (Signed)
Patient to transfer to 1416 report given to receiving nurse, all questions answered at this time.  Pt. VSS with no s/s of distress noted.  Patient stable at transfer.

## 2017-12-06 NOTE — Evaluation (Signed)
Physical Therapy Evaluation Patient Details Name: Matthew Lewis MRN: [id number] DOB: 12-Jun-1955 Today's Date: 12/06/2017   History of Present Illness  62 y.o. male with medical history significant for intrahepatic cholangiocarcinoma, metastatic to liver and lung, type 2 diabetes, prior history of alcohol abuse, cirrhosis, status post percutaneous biliary drainage drainage tube for biliary obstruction who presented on 12/03/2017 with several days of rigors, fevers, nausea and vomiting and generalized malaise.   Clinical Impression  Patient presents with decreased mobility due to pain.  Currently S with mobility and was mobilizing independent PTA.  Multiple stairs for home entry and will benefit from skilled PT in the acute setting to allow return home and independent during the day while family at work.     Follow Up Recommendations No PT follow up    Equipment Recommendations  None recommended by PT    Recommendations for Other Services       Precautions / Restrictions Precautions Precaution Comments: drain on right with leg bag      Mobility  Bed Mobility Overal bed mobility: Independent             General bed mobility comments: increased time, use of momentum shooting legs off EOB to lift trunk  Transfers Overall transfer level: Needs assistance   Transfers: Sit to/from Stand Sit to Stand: Supervision            Ambulation/Gait Ambulation/Gait assistance: Supervision Gait Distance (Feet): 700 Feet Assistive device: Rolling walker (2 wheeled) Gait Pattern/deviations: Step-through pattern;Trunk flexed     General Gait Details: stopped twice due to pain  Stairs            Wheelchair Mobility    Modified Rankin (Stroke Patients Only)       Balance Overall balance assessment: No apparent balance deficits (not formally assessed)                                           Pertinent Vitals/Pain Pain Assessment: Faces Faces  Pain Scale: Hurts even more Pain Location: R flank at drain site Pain Descriptors / Indicators: Discomfort;Sore Pain Intervention(s): Monitored during session;Repositioned    Home Living Family/patient expects to be discharged to:: Private residence Living Arrangements: Spouse/significant other Available Help at Discharge: Family Type of Home: House Home Access: Stairs to enter Entrance Stairs-Rails: Psychiatric nurse of Steps: 5 on one entrance, 8 on another bilateral rails Home Layout: Able to live on main level with bedroom/bathroom;Multi-level Home Equipment: Walker - 2 wheels      Prior Function Level of Independence: Independent with assistive device(s)         Comments: has been using RW     Hand Dominance        Extremity/Trunk Assessment        Lower Extremity Assessment Lower Extremity Assessment: Generalized weakness RLE Sensation: history of peripheral neuropathy LLE Sensation: history of peripheral neuropathy    Cervical / Trunk Assessment Cervical / Trunk Assessment: Normal  Communication   Communication: No difficulties  Cognition Arousal/Alertness: Awake/alert Behavior During Therapy: WFL for tasks assessed/performed Overall Cognitive Status: Within Functional Limits for tasks assessed                                        General Comments  Exercises     Assessment/Plan    PT Assessment Patient needs continued PT services  PT Problem List Decreased mobility;Decreased activity tolerance;Pain       PT Treatment Interventions Therapeutic activities;Gait training;Therapeutic exercise;Stair training;Functional mobility training    PT Goals (Current goals can be found in the Care Plan section)  Acute Rehab PT Goals Patient Stated Goal: to go home PT Goal Formulation: With patient Time For Goal Achievement: 12/13/17 Potential to Achieve Goals: Good    Frequency Min 3X/week   Barriers to discharge         Co-evaluation               AM-PAC PT "6 Clicks" Mobility  Outcome Measure Help needed turning from your back to your side while in a flat bed without using bedrails?: None Help needed moving from lying on your back to sitting on the side of a flat bed without using bedrails?: A Little Help needed moving to and from a bed to a chair (including a wheelchair)?: None Help needed standing up from a chair using your arms (e.g., wheelchair or bedside chair)?: A Little Help needed to walk in hospital room?: A Little Help needed climbing 3-5 steps with a railing? : A Little 6 Click Score: 20    End of Session Equipment Utilized During Treatment: Gait belt Activity Tolerance: Patient tolerated treatment well Patient left: in bed;with call bell/phone within reach   PT Visit Diagnosis: Difficulty in walking, not elsewhere classified (R26.2)    Time: 5462-7035 PT Time Calculation (min) (ACUTE ONLY): 27 min   Charges:   PT Evaluation $PT Eval Low Complexity: 1 Low PT Treatments $Gait Training: 8-22 mins        Magda Kiel, [place] [phone] 12/06/2017   Reginia Naas 12/06/2017, 3:52 PM

## 2017-12-06 NOTE — Progress Notes (Signed)
NAME:  Matthew Lewis, MRN:  [id number], DOB:  [birth year], LOS: 3 ADMISSION DATE:  12/03/2017, CONSULTATION DATE: 12/03/2017 REFERRING MD: Lisbeth Ply, CHIEF COMPLAINT: Sepsis  Brief History   Patient with a history of cholangiocarcinoma, metastatic disease Admitted with sepsis Gram-negative organisms, likely source biliary drain Requiring pressors-was weaned off pressors on 12/05/2017  History of present illness   Weakness and episode of vomiting on day of presentation 12/03/2017 He had return for follow-up today following having his drain flushed yesterday 11/26 He was in the hospital 1114 02/18/2016-was treated for hypotension, acute renal failure Biliary drainage was placed on 10/26/2017 Biopsy of the adrenal on 10/29/2017 did reveal metastatic adenocarcinoma  Feels better today  Past Medical History  History of diabetes Coronary artery disease Alcoholic cirrhosis Metastatic cholangiocarcinoma  Significant Hospital Events   Required central line placement 12/03/17 Hemodynamics-improving  Consults:  PCCM 12/03/2017 Oncology  Procedures:  Central line placement  Significant Diagnostic Tests:  Chest x-ray shows no acute infiltrate, bibasal nodules CT scan of the chest 10/27/2017 bibasal nodules  Micro Data:   Blood cultures positive for E. Coli 12/03/2017, 12/02/2017 Antimicrobials:  Rocephin 12/02/2017 Zosyn 12/03/2017.>> Meropenem 12/05/17>>  Interim history/subjective:  No significant complaints today Continues to improve   Objective   Blood pressure 95/71, pulse 95, temperature (!) 97.5 F (36.4 C), temperature source Oral, resp. rate 17, height 5\' 11"  (1.803 m), weight 94.9 kg, SpO2 100 %.        Intake/Output Summary (Last 24 hours) at 12/06/2017 0916 Last data filed at 12/06/2017 0800 Gross per 24 hour  Intake 2718.85 ml  Output 3800 ml  Net -1081.15 ml   Filed Weights   12/03/17 0930 12/05/17 0500  Weight: 86 kg 94.9 kg     Examination: General: Awake, not in distress HENT: External mucosa Lungs: Good air entry, no rales Cardiovascular: S1-S2 appreciated with no murmur Abdomen: Soft, nontender, bowel sounds appreciated Extremities: No clubbing, no edema  Resolved Hospital Problem list    Assessment & Plan:  Sepsis -Gram-negative sepsis-E. coli -Off pressors -Improved urine output -Continue meropenem -Gram-negative sepsis  Gram-negative sepsis-E. Coli -Meropenem day 2  Metastatic cholangio-carcinoma -Oncology following, advanced disease  Hypoglycemia -Resolved - Liver cirrhosis -Monitor enzymes  PCCM will sign off Patient being transferred to the medical floor Central line may be discontinued once patient does not require frequent blood draws and hemodynamics continue to be stable  Best practice:  Diet: As tolerated Pain/Anxiety/Delirium protocol (if indicated): Oxycodone  DVT prophylaxis: Lovenox Glucose control: Insulin coverage Mobility: As tolerated Code Status: DNR Family Communication: No family at bedside at present Disposition:   Labs   CBC: Recent Labs  Lab 12/01/17 1529 12/02/17 1252 12/03/17 0953 12/04/17 0846 12/05/17 0727  WBC 12.3* 14.2* 7.8 8.3 7.0  NEUTROABS 9.3* 13.4* 7.2  --   --   HGB 12.4* 11.8* 10.4* 8.3* 8.4*  HCT 39.5 36.8* 33.2* 26.2* 26.7*  MCV 92.5 92.7 95.4 95.3 93.0  PLT 326 248 146* 125* 125*    Basic Metabolic Panel: Recent Labs  Lab 12/01/17 1529 12/03/17 0953 12/03/17 1206 12/04/17 0846 12/05/17 0727 12/06/17 0530  NA 133* 128*  --  133* 130* 134*  K 4.8 4.1  --  3.5 3.7 3.9  CL 103 98  --  108 105 107  CO2 20* 16*  --  18* 18* 20*  GLUCOSE 126* 95  --  174* 145* 130*  BUN 16 25*  --  17 12 11   CREATININE 0.87 1.18 1.05  0.69 0.52* 0.56*  CALCIUM 10.3 9.1  --  8.2* 8.2* 8.1*   GFR: Estimated Creatinine Clearance: 109.6 mL/min (A) (by C-G formula based on SCr of 0.56 mg/dL (L)). Recent Labs  Lab 12/02/17 1252  12/03/17 0953 12/03/17 0958 12/03/17 1206 12/03/17 1620 12/04/17 0846 12/05/17 0727  WBC 14.2* 7.8  --   --   --  8.3 7.0  LATICACIDVEN  --   --  5.83* 3.7* 2.9*  --   --     Liver Function Tests: Recent Labs  Lab 12/01/17 1529 12/03/17 0953 12/04/17 0846 12/05/17 0727 12/06/17 0530  AST 54* 71* 61* 43* 30  ALT 46* 51* 43 37 31  ALKPHOS 622* 368* 210* 201* 203*  BILITOT 1.8* 4.4* 3.1* 2.6* 2.2*  PROT 9.7* 8.4* 6.7 6.7 6.4*  ALBUMIN 3.4* 3.2* 2.6* 2.5* 2.2*   No results for input(s): LIPASE, AMYLASE in the last 168 hours. No results for input(s): AMMONIA in the last 168 hours.  ABG    Component Value Date/Time   PHART 7.416 10/18/2008 2105   PCO2ART 41.6 10/18/2008 2105   PO2ART 216.0 (H) 10/18/2008 2105   HCO3 19.2 (L) 06/12/2016 1454   TCO2 27.5 10/18/2008 2105   ACIDBASEDEF 4.6 (H) 06/12/2016 1454   O2SAT 42.0 06/12/2016 1454     Coagulation Profile: Recent Labs  Lab 12/03/17 0953  INR 1.51    Cardiac Enzymes: No results for input(s): CKTOTAL, CKMB, CKMBINDEX, TROPONINI in the last 168 hours.  HbA1C: Hgb A1c MFr Bld  Date/Time Value Ref Range Status  11/09/2017 07:52 AM 4.9 4.8 - 5.6 % Final    Comment:    (NOTE) Pre diabetes:          5.7%-6.4% Diabetes:              >6.4% Glycemic control for   <7.0% adults with diabetes   10/27/2017 02:49 AM 6.5 (H) 4.8 - 5.6 % Final    Comment:    (NOTE)         Prediabetes: 5.7 - 6.4         Diabetes: >6.4         Glycemic control for adults with diabetes: <7.0     CBG: Recent Labs  Lab 12/05/17 0801 12/05/17 1258 12/05/17 1558 12/05/17 2133 12/06/17 0805  GLUCAP 128* 154* 130* 153* 125*    Review of Systems:   Review of Systems  Constitutional: Negative.  Negative for malaise/fatigue.  HENT: Negative.   Respiratory: Negative for cough, sputum production and shortness of breath.   Cardiovascular: Negative for chest pain.  Gastrointestinal: Negative for abdominal pain.  Skin: Negative.    All other systems reviewed and are negative.    Past Medical History  He,  has a past medical history of Abscess of anal and rectal regions, Alcoholic cirrhosis ([place]), Arthritis, Cirrhosis ([place]), Colon cancer ([place]) (05/2005), Cough, DM type 2 with diabetic peripheral neuropathy ([place]), Fatigue, Fistula, anal, History of substance abuse ([place]), Hypertension, PTSD (post-traumatic stress disorder), and Sleep deprivation.   Surgical History    Past Surgical History:  Procedure Laterality Date  . ABSCESS DRAINAGE     "probably 15-20 so far at least; rectal"  . ANAL FISTULECTOMY  12/13/1998  . ANTERIOR FUSION CERVICAL SPINE  2010   "triple"; "did 2 surgeries on the same day in 4098 due to complications"  . CORONARY ANGIOPLASTY  2001  . detached muscle  2010   right chest; "after cervical fusion complications"  . ELBOW SURGERY  2007   "cut out part of a muscle"; left  . EXAMINATION UNDER ANESTHESIA  03/22/2005   fistula  . EXAMINATION UNDER ANESTHESIA  06/06/2011   Procedure: EXAM UNDER ANESTHESIA;  Surgeon: Stark Klein, MD;  Location: [facility];  Service: General;  Laterality: N/A;  . HEMICOLECTOMY  06/06/2005   left  . INCISION AND DRAINAGE PERIRECTAL ABSCESS  03/30/2005  . INCISION AND DRAINAGE PERIRECTAL ABSCESS  12/13/2004  . INCISION AND DRAINAGE PERIRECTAL ABSCESS  07/14/2001  . INCISION AND DRAINAGE PERIRECTAL ABSCESS  08/12/2010   horseshoe abscess; Dr Redmond Pulling  . INCISION AND DRAINAGE PERIRECTAL ABSCESS  06/06/2011   Procedure: IRRIGATION AND DEBRIDEMENT PERIRECTAL ABSCESS;  Surgeon: Stark Klein, MD;  Location: [facility];  Service: General;  Laterality: N/A;  . INCISION AND DRAINAGE PERIRECTAL ABSCESS N/A 02/24/2017   Procedure: IRRIGATION AND DEBRIDEMENT PERIRECTAL ABSCESS;  Surgeon: Ralene Ok, MD;  Location: [facility];  Service: General;  Laterality: N/A;  . IR EXCHANGE BILIARY DRAIN  11/07/2017  . IR INT EXT BILIARY DRAIN WITH CHOLANGIOGRAM  10/26/2017  . IR PATIENT EVAL TECH 0-60 MINS   12/02/2017  . IR RADIOLOGIST EVAL & MGMT  11/06/2017  . [place]  . PERCUTANEOUS PINNING PHALANX FRACTURE OF HAND  ~ 2008   "plates in 2 places"; right  . PLACEMENT OF SETON N/A 09/05/2017   Procedure: PLACEMENT OF SETON;  Surgeon: Leighton Ruff, MD;  Location: [facility];  Service: General;  Laterality: N/A;  . RADIOLOGY WITH ANESTHESIA N/A 10/25/2017   Procedure: MRI WITH ANESTHESIA;  Surgeon: Radiologist, Medication, MD;  Location: [facility];  Service: Radiology;  Laterality: N/A;  . THORACIC DISCECTOMY  [phone]     Social History   reports that he quit smoking about 26 years ago. His smoking use included cigarettes. He has a 10.00 pack-year smoking history. He has never used smokeless tobacco. He reports that he drinks alcohol. He reports that he has current or past drug history. Drugs: Oxycodone and Marijuana.   Family History   His family history includes Cancer in his mother and sister; Heart disease in his brother, father, and mother.   Allergies Allergies  Allergen Reactions  . Morphine And Related Other (See Comments)    Patient is recovering from drug addiction and wants to avoid any narcotics     Home Medications  Prior to Admission medications   Medication Sig Start Date End Date Taking? Authorizing Provider  aspirin EC 81 MG tablet Take 81 mg by mouth daily.   Yes [provider]  atorvastatin (LIPITOR) 40 MG tablet Take 1 tablet (40 mg total) by mouth daily at 6 PM. 12/02/12  Yes Viyuoh, Adeline C, MD  calcium carbonate (TUMS - DOSED IN MG ELEMENTAL CALCIUM) 500 MG chewable tablet Chew 1 tablet by mouth 2 (two) times daily as needed for indigestion or heartburn.   Yes [provider]  celecoxib (CELEBREX) 200 MG capsule Take 200 mg by mouth at bedtime.  10/10/17  Yes [provider]  empagliflozin (JARDIANCE) 10 MG TABS tablet Take 10 mg by mouth at bedtime.    Yes [provider]  gabapentin (NEURONTIN)  400 MG capsule Take 400-800 mg by mouth See admin instructions. Take 400 mg by mouth in the morning and 800 mg in the evening   Yes [provider]  glipiZIDE (GLUCOTROL) 5 MG tablet Take 5 mg by mouth 2 (two) times daily before a meal.   Yes [provider]  Insulin Detemir (LEVEMIR FLEXPEN) 100 UNIT/ML Pen Inject 10-62 Units into the skin See admin instructions. Take 62 units subq in the morning and 10 units in the evening   Yes [provider]  lisinopril (PRINIVIL,ZESTRIL) 20 MG tablet Take 10 mg by mouth daily.    Yes [provider]  metFORMIN (GLUCOPHAGE) 1000 MG tablet Take 1,000 mg by mouth 2 (two) times daily with a meal.   Yes [provider]  Multiple Vitamin (MULTIVITAMIN) tablet Take 1 tablet by mouth daily.   Yes [provider]  ondansetron (ZOFRAN) 8 MG tablet Take 1 tablet (8 mg total) by mouth 2 (two) times daily as needed. Start on the third day after chemotherapy. 11/06/17  Yes [name], MD  polyvinyl alcohol (LIQUIFILM TEARS) 1.4 % ophthalmic solution Place 1 drop into both eyes daily as needed for dry eyes.    Yes [provider]  prochlorperazine (COMPAZINE) 10 MG tablet Take 1 tablet (10 mg total) by mouth every 6 (six) hours as needed (Nausea or vomiting). 11/06/17  Yes [name], MD  oxyCODONE 10 MG TABS Take 1 tablet (10 mg total) by mouth every 4 (four) hours as needed for severe pain. Patient not taking: Reported on 12/03/2017 10/29/17   Nita Sells, MD

## 2017-12-06 NOTE — Progress Notes (Signed)
[facility] PROGRESS NOTE  ZELL HYLTON TZG:[id number] DOB: May 28, 1955 DOA: 12/03/2017  PCP: [name], MD  Brief History/Interval Summary:  62 y.o. male with medical history significant for intrahepatic cholangiocarcinoma, metastatic to liver and lung, type 2 diabetes, prior history of alcohol abuse, cirrhosis, status post percutaneous biliary drainage drainage tube for biliary obstruction who presented on 12/03/2017 with several days of rigors, fevers, nausea and vomiting and generalized malaise.  On 11/26 patient was seen by IR for biliary drain flushing.  On the same day he was seen by oncology.  At that time he had complained of abdominal pain at the site of where the drain was flushed, he had also reported significant amount of pressure during that procedure.  He was afebrile at that evaluation but found to have a WBC of 14.2, complaints of rigors, complaints of nausea and emesis x1.  Blood cultures x2 and urine cultures were obtained and he received one-time dose of Rocephin 2 g IV.  He was also given IV Zofran and 1 L of normal saline for suspected dehydration. He was sent home with Keflex due to his prior history of Klebsiella infection.  Cultures were positive for gram-negative bacteria.  Patient was hospitalized for further management.  Patient was given Zosyn.  Cultures positive for ESBL E. coli.  Changed over to meropenem.  Reason for Visit: Gram negative bacteremia.  Septic shock  Consultants: Medical oncology.  Interventional radiology.  Critical care medicine  Procedures: Central line placement in the right IJ  Antibiotics: Zosyn till 11/29 Meropenem starting 11/29  Subjective/Interval History: Patient states that he feels well.  Denies any complaints this morning.  No nausea or vomiting.  Pain is reasonably well controlled.     ROS: Denies any headaches  Objective:  Vital Signs  Vitals:   12/06/17 0408 12/06/17 0559 12/06/17 0602 12/06/17 0700    BP:  104/70 102/74 95/71  Pulse:   96 95  Resp:   16 17  Temp: (!) 97.5 F (36.4 C)     TempSrc: Oral     SpO2:   99% 100%  Weight:      Height:        Intake/Output Summary (Last 24 hours) at 12/06/2017 0806 Last data filed at 12/06/2017 0500 Gross per 24 hour  Intake 1850.42 ml  Output 4000 ml  Net -2149.58 ml   Filed Weights   12/03/17 0930 12/05/17 0500  Weight: 86 kg 94.9 kg    General appearance: Patient is awake alert.  Mildly jaundiced.  In no distress. Resp: Normal effort at rest.  Diminished air entry at the bases.  No wheezing rales or rhonchi. Cardio: S1-S2 is normal regular.  No S3-S4.  No rubs murmurs or bruit GI: Abdomen is soft.  Nontender nondistended.  Biliary drain is present.  Bowel sounds are present.    Neurologic: Alert and oriented x3.  No obvious focal neurological deficits.  Lab Results:  Data Reviewed: I have personally reviewed following labs and imaging studies  CBC: Recent Labs  Lab 12/01/17 1529 12/02/17 1252 12/03/17 0953 12/04/17 0846 12/05/17 0727  WBC 12.3* 14.2* 7.8 8.3 7.0  NEUTROABS 9.3* 13.4* 7.2  --   --   HGB 12.4* 11.8* 10.4* 8.3* 8.4*  HCT 39.5 36.8* 33.2* 26.2* 26.7*  MCV 92.5 92.7 95.4 95.3 93.0  PLT 326 248 146* 125* 125*    Basic Metabolic Panel: Recent Labs  Lab 12/01/17 1529 12/03/17 0953 12/03/17 1206 12/04/17 0846 12/05/17 0727 12/06/17 0530  NA 133* 128*  --  133* 130* 134*  K 4.8 4.1  --  3.5 3.7 3.9  CL 103 98  --  108 105 107  CO2 20* 16*  --  18* 18* 20*  GLUCOSE 126* 95  --  174* 145* 130*  BUN 16 25*  --  17 12 11   CREATININE 0.87 1.18 1.05 0.69 0.52* 0.56*  CALCIUM 10.3 9.1  --  8.2* 8.2* 8.1*    GFR: Estimated Creatinine Clearance: 109.6 mL/min (A) (by C-G formula based on SCr of 0.56 mg/dL (L)).  Liver Function Tests: Recent Labs  Lab 12/01/17 1529 12/03/17 0953 12/04/17 0846 12/05/17 0727 12/06/17 0530  AST 54* 71* 61* 43* 30  ALT 46* 51* 43 37 31  ALKPHOS 622* 368* 210*  201* 203*  BILITOT 1.8* 4.4* 3.1* 2.6* 2.2*  PROT 9.7* 8.4* 6.7 6.7 6.4*  ALBUMIN 3.4* 3.2* 2.6* 2.5* 2.2*    Coagulation Profile: Recent Labs  Lab 12/03/17 0953  INR 1.51     CBG: Recent Labs  Lab 12/04/17 2111 12/05/17 0801 12/05/17 1258 12/05/17 1558 12/05/17 2133  GLUCAP 157* 128* 154* 130* 153*     Recent Results (from the past 240 hour(s))  Culture, Blood     Status: None (Preliminary result)   Collection Time: 12/02/17 12:51 PM  Result Value Ref Range Status   Specimen Description BLOOD LEFT HAND  Final   Special Requests   Final    BOTTLES DRAWN AEROBIC AND ANAEROBIC Blood Culture adequate volume   Culture   Final    NO GROWTH 4 DAYS Performed at [facility], [place] [address] [place], [place] [address]    Report Status PENDING  Incomplete  Culture, Blood     Status: Abnormal   Collection Time: 12/02/17 12:51 PM  Result Value Ref Range Status   Specimen Description BLOOD RIGHT HAND  Final   Special Requests   Final    BOTTLES DRAWN AEROBIC AND ANAEROBIC Blood Culture adequate volume   Culture  Setup Time   Final    GRAM NEGATIVE RODS ANAEROBIC BOTTLE ONLY Organism ID to follow    Culture (A)  Final    ESCHERICHIA COLI SUSCEPTIBILITIES PERFORMED ON PREVIOUS CULTURE WITHIN THE LAST 5 DAYS. Performed at [facility], [place] [address] [place], [place] [address]    Report Status 12/05/2017 FINAL  Final  Blood Culture ID Panel (Reflexed)     Status: Abnormal   Collection Time: 12/02/17 12:51 PM  Result Value Ref Range Status   Enterococcus species NOT DETECTED NOT DETECTED Final   Listeria monocytogenes NOT DETECTED NOT DETECTED Final   Staphylococcus species NOT DETECTED NOT DETECTED Final   Staphylococcus aureus (BCID) NOT DETECTED NOT DETECTED Final   Streptococcus species NOT DETECTED NOT DETECTED Final   Streptococcus agalactiae NOT DETECTED NOT DETECTED Final   Streptococcus pneumoniae NOT DETECTED NOT DETECTED Final   Streptococcus  pyogenes NOT DETECTED NOT DETECTED Final   Acinetobacter baumannii NOT DETECTED NOT DETECTED Final   Enterobacteriaceae species DETECTED (A) NOT DETECTED Final    Comment: Enterobacteriaceae represent a large family of gram-negative bacteria, not a single organism. CRITICAL RESULT CALLED TO, READ BACK BY AND VERIFIED WITH: E.WILLIAMSON,PHARMD AT 2145 ON 12/03/17 BY G.MCADOO    Enterobacter cloacae complex NOT DETECTED NOT DETECTED Final   Escherichia coli DETECTED (A) NOT DETECTED Final    Comment: CRITICAL RESULT CALLED TO, READ BACK BY AND VERIFIED WITH: E.WILLIAMSON,PHARMD AT 2145 ON 12/03/17  BY G.MCADOO    Klebsiella oxytoca NOT DETECTED NOT DETECTED Final   Klebsiella pneumoniae NOT DETECTED NOT DETECTED Final   Proteus species NOT DETECTED NOT DETECTED Final   Serratia marcescens NOT DETECTED NOT DETECTED Final   Carbapenem resistance NOT DETECTED NOT DETECTED Final   Haemophilus influenzae NOT DETECTED NOT DETECTED Final   Neisseria meningitidis NOT DETECTED NOT DETECTED Final   Pseudomonas aeruginosa NOT DETECTED NOT DETECTED Final   Candida albicans NOT DETECTED NOT DETECTED Final   Candida glabrata NOT DETECTED NOT DETECTED Final   Candida krusei NOT DETECTED NOT DETECTED Final   Candida parapsilosis NOT DETECTED NOT DETECTED Final   Candida tropicalis NOT DETECTED NOT DETECTED Final    Comment: Performed at [facility], [place] [address] [place], [place] [address]  Culture, blood (routine x 2)     Status: None (Preliminary result)   Collection Time: 12/03/17  9:54 AM  Result Value Ref Range Status   Specimen Description   Final    LEFT ANTECUBITAL Performed at [place] [address] [place], [place] [address]    Special Requests   Final    BOTTLES DRAWN AEROBIC AND ANAEROBIC Blood Culture adequate volume Performed at [facility] [address], [place], [place] [address]    Culture  Setup Time   Final    GRAM  NEGATIVE RODS IN BOTH AEROBIC AND ANAEROBIC BOTTLES CRITICAL VALUE NOTED.  VALUE IS CONSISTENT WITH PREVIOUSLY REPORTED AND CALLED VALUE.    Culture   Final    GRAM NEGATIVE RODS IDENTIFICATION TO FOLLOW Performed at [facility], [place] [address], [place], [place] [address]    Report Status PENDING  Incomplete  Culture, blood (routine x 2)     Status: Abnormal   Collection Time: 12/03/17 10:44 AM  Result Value Ref Range Status   Specimen Description   Final    BLOOD LEFT HAND Performed at [facility] [place] [address] [place], [place] [address]    Special Requests   Final    BOTTLES DRAWN AEROBIC AND ANAEROBIC Blood Culture adequate volume Performed at [facility] [address], [place], [place] [address]    Culture  Setup Time   Final    GRAM NEGATIVE RODS IN BOTH AEROBIC AND ANAEROBIC BOTTLES Organism ID to follow CRITICAL RESULT CALLED TO, READ BACK BY AND VERIFIED WITH: E.WILLIAMSON,PHARMD AT 2145 ON 12/03/17 BY G.MCADOO Performed at [facility], [place] [address], [place], [place] [address]    Culture (A)  Final    ESCHERICHIA COLI Confirmed Extended Spectrum Beta-Lactamase Producer (ESBL).  In bloodstream infections from ESBL organisms, carbapenems are preferred over piperacillin/tazobactam. They are shown to have a lower risk of mortality.    Report Status 12/05/2017 FINAL  Final   Organism ID, Bacteria ESCHERICHIA COLI  Final      Susceptibility   Escherichia coli - MIC*    AMPICILLIN >=32 RESISTANT Resistant     CEFAZOLIN >=64 RESISTANT Resistant     CEFEPIME RESISTANT Resistant     CEFTAZIDIME >=64 RESISTANT Resistant     CEFTRIAXONE >=64 RESISTANT Resistant     CIPROFLOXACIN 1 SENSITIVE Sensitive     GENTAMICIN <=1 SENSITIVE Sensitive     IMIPENEM 1 SENSITIVE Sensitive     TRIMETH/SULFA <=20 SENSITIVE Sensitive     AMPICILLIN/SULBACTAM >=32 RESISTANT Resistant     PIP/TAZO >=128 RESISTANT Resistant      Extended ESBL POSITIVE  Resistant     * ESCHERICHIA COLI  Blood Culture ID Panel (Reflexed)     Status: Abnormal   Collection Time: 12/03/17 10:44 AM  Result Value Ref Range Status   Enterococcus species NOT DETECTED NOT DETECTED Final   Listeria monocytogenes NOT DETECTED NOT DETECTED Final   Staphylococcus species NOT DETECTED NOT DETECTED Final   Staphylococcus aureus (BCID) NOT DETECTED NOT DETECTED Final   Streptococcus species NOT DETECTED NOT DETECTED Final   Streptococcus agalactiae NOT DETECTED NOT DETECTED Final   Streptococcus pneumoniae NOT DETECTED NOT DETECTED Final   Streptococcus pyogenes NOT DETECTED NOT DETECTED Final   Acinetobacter baumannii NOT DETECTED NOT DETECTED Final   Enterobacteriaceae species DETECTED (A) NOT DETECTED Corrected    Comment: Enterobacteriaceae represent a large family of gram-negative bacteria, not a single organism. CRITICAL RESULT CALLED TO, READ BACK BY AND VERIFIED WITH: E.WILLIAMSON,PHARMD AT 2145 ON 12/03/17 BY G.MCADOO CORRECTED ON 11/27 AT 2249: PREVIOUSLY REPORTED AS DETECTED Enterobacteriaceae represent a large family of gram negative bacteria, not a single organism. CRITICAL RESULT CALLED TO, READ BACK BY AND VERIFIED WITH: H.BAIRD,PHARMD AT 2143 ON 12/03/17 BY  G.MCADOO    Enterobacter cloacae complex NOT DETECTED NOT DETECTED Final   Escherichia coli DETECTED (A) NOT DETECTED Corrected    Comment: CRITICAL RESULT CALLED TO, READ BACK BY AND VERIFIED WITH: E.WILLIAMSON,PHARMD AT 2145 ON 12/03/17 BY G.MCADOO CORRECTED ON 11/27 AT 2249: PREVIOUSLY REPORTED AS DETECTED CRITICAL RESULT CALLED TO, READ BACK BY AND VERIFIED WITH: H.BAIRD,PHARMD AT 2143 ON 12/03/17 BY G.MCADOO    Klebsiella oxytoca NOT DETECTED NOT DETECTED Final   Klebsiella pneumoniae NOT DETECTED NOT DETECTED Final   Proteus species NOT DETECTED NOT DETECTED Final   Serratia marcescens NOT DETECTED NOT DETECTED Final   Haemophilus influenzae NOT DETECTED NOT DETECTED  Final   Neisseria meningitidis NOT DETECTED NOT DETECTED Final   Pseudomonas aeruginosa NOT DETECTED NOT DETECTED Final   Candida albicans NOT DETECTED NOT DETECTED Final   Candida glabrata NOT DETECTED NOT DETECTED Final   Candida krusei NOT DETECTED NOT DETECTED Final   Candida parapsilosis NOT DETECTED NOT DETECTED Final   Candida tropicalis NOT DETECTED NOT DETECTED Final    Comment: Performed at [facility], [place] [address], [place], [place] [address]      Radiology Studies: No results found.   Medications:  Scheduled: . celecoxib  200 mg Oral QPM  . Chlorhexidine Gluconate Cloth  6 each Topical Daily  . enoxaparin (LOVENOX) injection  40 mg Subcutaneous Q24H  . gabapentin  400 mg Oral Daily  . gabapentin  800 mg Oral QPM  . lactose free nutrition  237 mL Oral TID WC  . loratadine  10 mg Oral Daily  . sodium chloride flush  10-40 mL Intracatheter Q12H   Continuous: . dextrose 5 % and 0.9% NaCl 50 mL/hr at 12/05/17 2114  . meropenem (MERREM) IV 1 g (12/06/17 0337)  . sodium chloride     CWU:[id number] **OR** acetaminophen, ondansetron **OR** ondansetron (ZOFRAN) IV, oxyCODONE, polyvinyl alcohol, senna-docusate, sodium chloride flush    Assessment/Plan:  Septic shock secondary to ESBL E. coli bacteremia with lactic acidosis Most likely biliary source considering his previous history and presence of biliary drain.  CT scan of the abdomen shows that the drain is in good position.  Stable ductal dilatation and masses noted compared to previous imaging studies.  Patient was seen by critical care medicine.  Central line was placed.  Patient was placed on pressors.  Blood pressures improved.  Patient was weaned off of Levophed.  He has been off of Levophed for 24 hours.  Blood pressure is stable.  Cortisol level 14.8.  Patient noted to have ESBL E. coli bacteremia.  Zosyn was changed over to meropenem.  Lactic acid levels have improved.    History of cholangiocarcinoma  with metastases with presence of biliary drain Patient has had recurrent issues with his drain with bacteremia.  Medical oncology and interventional radiology following this patient.  Plan is for intervention early next week.  Since the drain is in stable position, there does not appear to be any urgent need to intervene immediately.  He seems to be stabilizing from an infection standpoint as well.  LFTs are stable.  Mild hyperbilirubinemia is noted.  Alkaline phosphatase is also mildly elevated.  Mild hyponatremia, due to hypovolemia Stable  Normocytic anemia Baseline hemoglobin around 9-10.  Drop in hemoglobin likely dilutional.  No evidence of overt bleeding.  Mild acute kidney injury Renal function is back to baseline.  Monitor urine output.  Diabetes mellitus type 2 with hyperglycemia/peripheral neuropathy HbA1c 4.9 on November 3.  Patient has had a few hypoglycemic episodes in the hospital which could be due to sepsis.  He is on a regular diet.  CBGs are normal now.  Patient is on oral hypoglycemics at home which has been held.  Considering his low HbA1c may need to stop this altogether.  Continue gabapentin.  Patient also on Celebrex which he requested be initiated.  Essential hypertension Blood pressures have improved.  Continue to hold lisinopril.  DVT Prophylaxis: Lovenox    Code Status: DNR Family Communication: Discussed with the patient Disposition Plan: Management as outlined above.  Okay for transfer to floor.  Start mobilizing.    LOS: 3 days   [facility] Pager [phone] 12/06/2017, 8:06 AM  If 7PM-7AM, please contact night-coverage at [email or web address], password [facility]

## 2017-12-06 NOTE — Progress Notes (Signed)
Patient transferred from ICU. VSS, tele applied, denies any needs at this time. Agree with previous RN assessment. Will continue to monitor.

## 2017-12-07 DIAGNOSIS — D649 Anemia, unspecified: Secondary | ICD-10-CM

## 2017-12-07 LAB — COMPREHENSIVE METABOLIC PANEL
ALT: 28 U/L (ref 0–44)
AST: 28 U/L (ref 15–41)
Albumin: 2.2 g/dL — ABNORMAL LOW (ref 3.5–5.0)
Alkaline Phosphatase: 225 U/L — ABNORMAL HIGH (ref 38–126)
Anion gap: 5 (ref 5–15)
BILIRUBIN TOTAL: 2 mg/dL — AB (ref 0.3–1.2)
BUN: 12 mg/dL (ref 8–23)
CO2: 22 mmol/L (ref 22–32)
Calcium: 8.1 mg/dL — ABNORMAL LOW (ref 8.9–10.3)
Chloride: 109 mmol/L (ref 98–111)
Creatinine, Ser: 0.66 mg/dL (ref 0.61–1.24)
GFR calc Af Amer: >60 mL/min (ref >60)
GFR calc non Af Amer: >60 mL/min (ref >60)
Glucose, Bld: 132 mg/dL — ABNORMAL HIGH (ref 70–99)
Potassium: 3.7 mmol/L (ref 3.5–5.1)
Sodium: 136 mmol/L (ref 135–145)
TOTAL PROTEIN: 6.3 g/dL — AB (ref 6.5–8.1)

## 2017-12-07 LAB — CULTURE, BLOOD (SINGLE)
CULTURE: NO GROWTH
SPECIAL REQUESTS: ADEQUATE

## 2017-12-07 LAB — CBC
HEMATOCRIT: 26 % — AB (ref 39.0–52.0)
Hemoglobin: 8 g/dL — ABNORMAL LOW (ref 13.0–17.0)
MCH: 28.3 pg (ref 26.0–34.0)
MCHC: 30.8 g/dL (ref 30.0–36.0)
MCV: 91.9 fL (ref 80.0–100.0)
Platelets: 123 10*3/uL — ABNORMAL LOW (ref 150–400)
RBC: 2.83 MIL/uL — ABNORMAL LOW (ref 4.22–5.81)
RDW: 16.7 % — ABNORMAL HIGH (ref 11.5–15.5)
WBC: 4 10*3/uL (ref 4.0–10.5)
nRBC: 0 % (ref 0.0–0.2)

## 2017-12-07 LAB — GLUCOSE, CAPILLARY
Glucose-Capillary: 119 mg/dL — ABNORMAL HIGH (ref 70–99)
Glucose-Capillary: 133 mg/dL — ABNORMAL HIGH (ref 70–99)
Glucose-Capillary: 143 mg/dL — ABNORMAL HIGH (ref 70–99)
Glucose-Capillary: 135 mg/dL — ABNORMAL HIGH (ref 70–99)

## 2017-12-07 NOTE — Progress Notes (Signed)
[facility] PROGRESS NOTE  Matthew Lewis ZWC:[id number] DOB: [birth year] DOA: 12/03/2017  PCP: [name], MD  Brief History/Interval Summary:  62 y.o. male with medical history significant for intrahepatic cholangiocarcinoma, metastatic to liver and lung, type 2 diabetes, prior history of alcohol abuse, cirrhosis, status post percutaneous biliary drainage drainage tube for biliary obstruction who presented on 12/03/2017 with several days of rigors, fevers, nausea and vomiting and generalized malaise.  On 11/26 patient was seen by IR for biliary drain flushing.  On the same day he was seen by oncology.  At that time he had complained of abdominal pain at the site of where the drain was flushed, he had also reported significant amount of pressure during that procedure.  He was afebrile at that evaluation but found to have a WBC of 14.2, complaints of rigors, complaints of nausea and emesis x1.  Blood cultures x2 and urine cultures were obtained and he received one-time dose of Rocephin 2 g IV.  He was also given IV Zofran and 1 L of normal saline for suspected dehydration. He was sent home with Keflex due to his prior history of Klebsiella infection. Patient was hospitalized for further management.  Patient was given Zosyn.  Cultures positive for ESBL E. coli.  Changed over to meropenem.  Reason for Visit: ESBL E. coli bacteremia.    Consultants: Medical oncology.  Interventional radiology.  Critical care medicine  Procedures: Central line placement in the right IJ  Antibiotics: Zosyn till 11/29 Meropenem starting 11/29  Subjective/Interval History: Patient states that he feels well.  Denies any abdominal pain nausea or vomiting.     ROS: Denies any headaches  Objective:  Vital Signs  Vitals:   12/06/17 1429 12/06/17 2142 12/07/17 0421 12/07/17 1241  BP: 113/72 94/67 110/72 101/68  Pulse: (!) 104 (!) 101 97 92  Resp: (!) 28 18 18 20   Temp: 98.3 F (36.8 C) 98.4  F (36.9 C) 98.1 F (36.7 C) 98.2 F (36.8 C)  TempSrc: Oral Oral Oral Oral  SpO2: 100% 98% 100% 99%  Weight:   96.8 kg   Height:        Intake/Output Summary (Last 24 hours) at 12/07/2017 1249 Last data filed at 12/07/2017 1034 Gross per 24 hour  Intake 1360.07 ml  Output 3425 ml  Net -2064.93 ml   Filed Weights   12/03/17 0930 12/05/17 0500 12/07/17 0421  Weight: 86 kg 94.9 kg 96.8 kg    General appearance: Patient is awake alert.  No distress Resp: Normal effort at rest.  Diminished air entry at the bases.  No wheezing rales or rhonchi Cardio: S1-S2 is normal regular.  No S3-S4.  No rubs murmurs or bruit GI: Abdomen is soft.  Nontender nondistended.  Biliary drain is present. Neurologic: Alert and oriented x3.  No obvious focal neurological deficits.  Lab Results:  Data Reviewed: I have personally reviewed following labs and imaging studies  CBC: Recent Labs  Lab 12/01/17 1529 12/02/17 1252 12/03/17 0953 12/04/17 0846 12/05/17 0727 12/07/17 0415  WBC 12.3* 14.2* 7.8 8.3 7.0 4.0  NEUTROABS 9.3* 13.4* 7.2  --   --   --   HGB 12.4* 11.8* 10.4* 8.3* 8.4* 8.0*  HCT 39.5 36.8* 33.2* 26.2* 26.7* 26.0*  MCV 92.5 92.7 95.4 95.3 93.0 91.9  PLT 326 248 146* 125* 125* 123*    Basic Metabolic Panel: Recent Labs  Lab 12/03/17 0953 12/03/17 1206 12/04/17 0846 12/05/17 0727 12/06/17 0530 12/07/17 0415  NA 128*  --  133* 130* 134* 136  K 4.1  --  3.5 3.7 3.9 3.7  CL 98  --  108 105 107 109  CO2 16*  --  18* 18* 20* 22  GLUCOSE 95  --  174* 145* 130* 132*  BUN 25*  --  17 12 11 12   CREATININE 1.18 1.05 0.69 0.52* 0.56* 0.66  CALCIUM 9.1  --  8.2* 8.2* 8.1* 8.1*    GFR: Estimated Creatinine Clearance: 110.7 mL/min (by C-G formula based on SCr of 0.66 mg/dL).  Liver Function Tests: Recent Labs  Lab 12/03/17 0953 12/04/17 0846 12/05/17 0727 12/06/17 0530 12/07/17 0415  AST 71* 61* 43* 30 28  ALT 51* 43 37 31 28  ALKPHOS 368* 210* 201* 203* 225*  BILITOT  4.4* 3.1* 2.6* 2.2* 2.0*  PROT 8.4* 6.7 6.7 6.4* 6.3*  ALBUMIN 3.2* 2.6* 2.5* 2.2* 2.2*    Coagulation Profile: Recent Labs  Lab 12/03/17 0953  INR 1.51     CBG: Recent Labs  Lab 12/06/17 1152 12/06/17 1646 12/06/17 2150 12/07/17 0746 12/07/17 1238  GLUCAP 107* 119* 125* 119* 133*     Recent Results (from the past 240 hour(s))  Culture, Blood     Status: None   Collection Time: 12/02/17 12:51 PM  Result Value Ref Range Status   Specimen Description BLOOD LEFT HAND  Final   Special Requests   Final    BOTTLES DRAWN AEROBIC AND ANAEROBIC Blood Culture adequate volume   Culture   Final    NO GROWTH 5 DAYS Performed at [facility], [place] [name] [place], [place] [address]    Report Status 12/07/2017 FINAL  Final  Culture, Blood     Status: Abnormal   Collection Time: 12/02/17 12:51 PM  Result Value Ref Range Status   Specimen Description BLOOD RIGHT HAND  Final   Special Requests   Final    BOTTLES DRAWN AEROBIC AND ANAEROBIC Blood Culture adequate volume   Culture  Setup Time   Final    GRAM NEGATIVE RODS ANAEROBIC BOTTLE ONLY Organism ID to follow    Culture (A)  Final    ESCHERICHIA COLI SUSCEPTIBILITIES PERFORMED ON PREVIOUS CULTURE WITHIN THE LAST 5 DAYS. Performed at [facility], [place] [address] [place],  [address]    Report Status 12/05/2017 FINAL  Final  Blood Culture ID Panel (Reflexed)     Status: Abnormal   Collection Time: 12/02/17 12:51 PM  Result Value Ref Range Status   Enterococcus species NOT DETECTED NOT DETECTED Final   Listeria monocytogenes NOT DETECTED NOT DETECTED Final   Staphylococcus species NOT DETECTED NOT DETECTED Final   Staphylococcus aureus (BCID) NOT DETECTED NOT DETECTED Final   Streptococcus species NOT DETECTED NOT DETECTED Final   Streptococcus agalactiae NOT DETECTED NOT DETECTED Final   Streptococcus pneumoniae NOT DETECTED NOT DETECTED Final   Streptococcus pyogenes NOT DETECTED NOT DETECTED  Final   Acinetobacter baumannii NOT DETECTED NOT DETECTED Final   Enterobacteriaceae species DETECTED (A) NOT DETECTED Final    Comment: Enterobacteriaceae represent a large family of gram-negative bacteria, not a single organism. CRITICAL RESULT CALLED TO, READ BACK BY AND VERIFIED WITH: E.WILLIAMSON,PHARMD AT 2145 ON 12/03/17 BY G.MCADOO    Enterobacter cloacae complex NOT DETECTED NOT DETECTED Final   Escherichia coli DETECTED (A) NOT DETECTED Final    Comment: CRITICAL RESULT CALLED TO, READ BACK BY AND VERIFIED WITH: E.WILLIAMSON,PHARMD AT 2145 ON 12/03/17 BY G.MCADOO    Klebsiella oxytoca NOT  DETECTED NOT DETECTED Final   Klebsiella pneumoniae NOT DETECTED NOT DETECTED Final   Proteus species NOT DETECTED NOT DETECTED Final   Serratia marcescens NOT DETECTED NOT DETECTED Final   Carbapenem resistance NOT DETECTED NOT DETECTED Final   Haemophilus influenzae NOT DETECTED NOT DETECTED Final   Neisseria meningitidis NOT DETECTED NOT DETECTED Final   Pseudomonas aeruginosa NOT DETECTED NOT DETECTED Final   Candida albicans NOT DETECTED NOT DETECTED Final   Candida glabrata NOT DETECTED NOT DETECTED Final   Candida krusei NOT DETECTED NOT DETECTED Final   Candida parapsilosis NOT DETECTED NOT DETECTED Final   Candida tropicalis NOT DETECTED NOT DETECTED Final    Comment: Performed at [facility], [place] [address], [place], [place] [address]  Culture, blood (routine x 2)     Status: Abnormal (Preliminary result)   Collection Time: 12/03/17  9:54 AM  Result Value Ref Range Status   Specimen Description   Final    LEFT ANTECUBITAL Performed at [facility] [address], [place], [place] [address]    Special Requests   Final    BOTTLES DRAWN AEROBIC AND ANAEROBIC Blood Culture adequate volume Performed at [facility] [address] [place], [place] [address]    Culture  Setup Time   Final    GRAM NEGATIVE RODS IN BOTH AEROBIC AND  ANAEROBIC BOTTLES CRITICAL VALUE NOTED.  VALUE IS CONSISTENT WITH PREVIOUSLY REPORTED AND CALLED VALUE.    Culture (A)  Final    ESCHERICHIA COLI SUSCEPTIBILITIES PERFORMED ON PREVIOUS CULTURE WITHIN THE LAST 5 DAYS. Performed at [facility], [place] [address], [place], [place] [address]    Report Status PENDING  Incomplete  Culture, blood (routine x 2)     Status: Abnormal   Collection Time: 12/03/17 10:44 AM  Result Value Ref Range Status   Specimen Description   Final    BLOOD LEFT HAND Performed at [facility] [address], [place], [place] [address]    Special Requests   Final    BOTTLES DRAWN AEROBIC AND ANAEROBIC Blood Culture adequate volume Performed at [facility] [address] [place], [place] [address]    Culture  Setup Time   Final    GRAM NEGATIVE RODS IN BOTH AEROBIC AND ANAEROBIC BOTTLES Organism ID to follow CRITICAL RESULT CALLED TO, READ BACK BY AND VERIFIED WITH: E.WILLIAMSON,PHARMD AT 2145 ON 12/03/17 BY G.MCADOO Performed at [facility], [place] [address], [place], [place] [address]    Culture (A)  Final    ESCHERICHIA COLI Confirmed Extended Spectrum Beta-Lactamase Producer (ESBL).  In bloodstream infections from ESBL organisms, carbapenems are preferred over piperacillin/tazobactam. They are shown to have a lower risk of mortality.    Report Status 12/05/2017 FINAL  Final   Organism ID, Bacteria ESCHERICHIA COLI  Final      Susceptibility   Escherichia coli - MIC*    AMPICILLIN >=32 RESISTANT Resistant     CEFAZOLIN >=64 RESISTANT Resistant     CEFEPIME RESISTANT Resistant     CEFTAZIDIME >=64 RESISTANT Resistant     CEFTRIAXONE >=64 RESISTANT Resistant     CIPROFLOXACIN 1 SENSITIVE Sensitive     GENTAMICIN <=1 SENSITIVE Sensitive     IMIPENEM 1 SENSITIVE Sensitive     TRIMETH/SULFA <=20 SENSITIVE Sensitive     AMPICILLIN/SULBACTAM >=32 RESISTANT Resistant     PIP/TAZO >=128 RESISTANT Resistant      Extended ESBL POSITIVE Resistant     *  ESCHERICHIA COLI  Blood Culture ID Panel (Reflexed)     Status: Abnormal   Collection Time: 12/03/17 10:44 AM  Result Value Ref Range Status   Enterococcus species NOT DETECTED NOT DETECTED Final   Listeria monocytogenes NOT DETECTED NOT DETECTED Final   Staphylococcus species NOT DETECTED NOT DETECTED Final   Staphylococcus aureus (BCID) NOT DETECTED NOT DETECTED Final   Streptococcus species NOT DETECTED NOT DETECTED Final   Streptococcus agalactiae NOT DETECTED NOT DETECTED Final   Streptococcus pneumoniae NOT DETECTED NOT DETECTED Final   Streptococcus pyogenes NOT DETECTED NOT DETECTED Final   Acinetobacter baumannii NOT DETECTED NOT DETECTED Final   Enterobacteriaceae species DETECTED (A) NOT DETECTED Corrected    Comment: Enterobacteriaceae represent a large family of gram-negative bacteria, not a single organism. CRITICAL RESULT CALLED TO, READ BACK BY AND VERIFIED WITH: E.WILLIAMSON,PHARMD AT 2145 ON 12/03/17 BY G.MCADOO CORRECTED ON 11/27 AT 2249: PREVIOUSLY REPORTED AS DETECTED Enterobacteriaceae represent a large family of gram negative bacteria, not a single organism. CRITICAL RESULT CALLED TO, READ BACK BY AND VERIFIED WITH: H.BAIRD,PHARMD AT 2143 ON 12/03/17 BY  G.MCADOO    Enterobacter cloacae complex NOT DETECTED NOT DETECTED Final   Escherichia coli DETECTED (A) NOT DETECTED Corrected    Comment: CRITICAL RESULT CALLED TO, READ BACK BY AND VERIFIED WITH: E.WILLIAMSON,PHARMD AT 2145 ON 12/03/17 BY G.MCADOO CORRECTED ON 11/27 AT 2249: PREVIOUSLY REPORTED AS DETECTED CRITICAL RESULT CALLED TO, READ BACK BY AND VERIFIED WITH: H.BAIRD,PHARMD AT 2143 ON 12/03/17 BY G.MCADOO    Klebsiella oxytoca NOT DETECTED NOT DETECTED Final   Klebsiella pneumoniae NOT DETECTED NOT DETECTED Final   Proteus species NOT DETECTED NOT DETECTED Final   Serratia marcescens NOT DETECTED NOT DETECTED Final   Haemophilus influenzae NOT DETECTED NOT  DETECTED Final   Neisseria meningitidis NOT DETECTED NOT DETECTED Final   Pseudomonas aeruginosa NOT DETECTED NOT DETECTED Final   Candida albicans NOT DETECTED NOT DETECTED Final   Candida glabrata NOT DETECTED NOT DETECTED Final   Candida krusei NOT DETECTED NOT DETECTED Final   Candida parapsilosis NOT DETECTED NOT DETECTED Final   Candida tropicalis NOT DETECTED NOT DETECTED Final    Comment: Performed at [facility], [place] [address] [place], [place] [address]      Radiology Studies: No results found.   Medications:  Scheduled: . celecoxib  200 mg Oral QPM  . enoxaparin (LOVENOX) injection  40 mg Subcutaneous Q24H  . gabapentin  400 mg Oral Daily  . gabapentin  800 mg Oral QPM  . lactose free nutrition  237 mL Oral TID WC  . loratadine  10 mg Oral Daily  . sodium chloride flush  10-40 mL Intracatheter Q12H  . sodium chloride flush  10-40 mL Intracatheter Q12H   Continuous: . dextrose 5 % and 0.9% NaCl 50 mL/hr at 12/06/17 1600  . meropenem (MERREM) IV 1 g (12/07/17 0928)  . sodium chloride     [id number] **OR** acetaminophen, ondansetron **OR** ondansetron (ZOFRAN) IV, oxyCODONE, polyvinyl alcohol, senna-docusate, sodium chloride flush, sodium chloride flush, zolpidem    Assessment/Plan:  Septic shock secondary to ESBL E. coli bacteremia with lactic acidosis Most likely biliary source considering his previous history and presence of biliary drain.  CT scan of the abdomen showed that the drain was in good position.  Stable ductal dilatation and masses noted compared to previous imaging studies.  Patient was seen by critical care medicine.  Central line was placed.  Patient was placed on pressors.  Blood pressures  improved.  Patient was weaned off of Levophed.  He was transferred to the floor.  Blood pressure has been stable.  Cortisol level 14.8.  Zosyn was changed over to meropenem as he was found to have E. coli ESBL bacteremia.  Sensitivities were reviewed.   Lactic acid levels improved.  Plan is for Port-A-Cath placement as well as biliary stent procedure on Tuesday.  Will need to discuss with ID regarding further management of this bacteremia.     History of cholangiocarcinoma with metastases with presence of biliary drain Patient has had recurrent issues with his drain with bacteremia.  Medical oncology and interventional radiology following this patient.  Plan is for intervention by interventional radiology on 12/3.  LFTs are stable.  Mild hyperbilirubinemia is noted.  Alkaline phosphatase is also mildly elevated.  Mild hyponatremia, due to hypovolemia Improved  Normocytic anemia Baseline hemoglobin around 9-10.  Drop in hemoglobin likely dilutional.  Hemoglobin low but stable.  Mild acute kidney injury Renal function is back to baseline.  Monitor urine output.  Diabetes mellitus type 2 with hyperglycemia/peripheral neuropathy HbA1c 4.9 on November 3.  Patient has had a few hypoglycemic episodes in the hospital which could be due to sepsis.  He is on a regular diet.  CBGs are normal now.  Patient is on oral hypoglycemics at home which has been held.  Considering his low HbA1c may need to stop this altogether.  Continue gabapentin.  Patient also on Celebrex which he requested be initiated.  Essential hypertension Blood pressures have improved.  Continue to hold lisinopril.  DVT Prophylaxis: Lovenox    Code Status: DNR Family Communication: Discussed with the patient Disposition Plan: Management as outlined above.  Mobilize.    LOS: 4 days   [facility] Pager [phone] 12/07/2017, 12:49 PM  If 7PM-7AM, please contact night-coverage at [email or web address], password [facility]

## 2017-12-08 DIAGNOSIS — A4150 Gram-negative sepsis, unspecified: Secondary | ICD-10-CM

## 2017-12-08 LAB — CBC
HCT: 28 % — ABNORMAL LOW (ref 39.0–52.0)
Hemoglobin: 8.7 g/dL — ABNORMAL LOW (ref 13.0–17.0)
MCH: 28.5 pg (ref 26.0–34.0)
MCHC: 31.1 g/dL (ref 30.0–36.0)
MCV: 91.8 fL (ref 80.0–100.0)
NRBC: 0 % (ref 0.0–0.2)
Platelets: 149 10*3/uL — ABNORMAL LOW (ref 150–400)
RBC: 3.05 MIL/uL — ABNORMAL LOW (ref 4.22–5.81)
RDW: 16.6 % — ABNORMAL HIGH (ref 11.5–15.5)
WBC: 5.8 10*3/uL (ref 4.0–10.5)

## 2017-12-08 LAB — COMPREHENSIVE METABOLIC PANEL
ALT: 30 U/L (ref 0–44)
AST: 29 U/L (ref 15–41)
Albumin: 2.3 g/dL — ABNORMAL LOW (ref 3.5–5.0)
Alkaline Phosphatase: 310 U/L — ABNORMAL HIGH (ref 38–126)
Anion gap: 6 (ref 5–15)
BUN: 12 mg/dL (ref 8–23)
CHLORIDE: 109 mmol/L (ref 98–111)
CO2: 21 mmol/L — ABNORMAL LOW (ref 22–32)
Calcium: 8.3 mg/dL — ABNORMAL LOW (ref 8.9–10.3)
Creatinine, Ser: 0.46 mg/dL — ABNORMAL LOW (ref 0.61–1.24)
GFR calc Af Amer: >60 mL/min (ref >60)
GFR calc non Af Amer: >60 mL/min (ref >60)
Glucose, Bld: 137 mg/dL — ABNORMAL HIGH (ref 70–99)
POTASSIUM: 3.7 mmol/L (ref 3.5–5.1)
SODIUM: 136 mmol/L (ref 135–145)
Total Bilirubin: 1.8 mg/dL — ABNORMAL HIGH (ref 0.3–1.2)
Total Protein: 6.6 g/dL (ref 6.5–8.1)

## 2017-12-08 LAB — CULTURE, BLOOD (ROUTINE X 2): Special Requests: ADEQUATE

## 2017-12-08 LAB — GLUCOSE, CAPILLARY
GLUCOSE-CAPILLARY: 158 mg/dL — AB (ref 70–99)
Glucose-Capillary: 142 mg/dL — ABNORMAL HIGH (ref 70–99)
Glucose-Capillary: 122 mg/dL — ABNORMAL HIGH (ref 70–99)
Glucose-Capillary: 151 mg/dL — ABNORMAL HIGH (ref 70–99)

## 2017-12-08 MED ORDER — ENOXAPARIN SODIUM 40 MG/0.4ML ~~LOC~~ SOLN
40.0000 mg | SUBCUTANEOUS | Status: DC
  Administered 2017-12-09: 40 mg via SUBCUTANEOUS
  Filled 2017-12-08 (×2): qty 0.4

## 2017-12-08 NOTE — Progress Notes (Signed)
Referring Physician(s): Nettey,S/Feng,Y  Supervising Physician: Aletta Edouard  Patient Status:  [facility] - In-pt  Chief Complaint: cholangiocarcinoma   Subjective: Patient familiar to IR service from prior PTC with internal/external biliary drain placement on 10/26/2017 with drain exchange on 11/08/2017 and right adrenal mass biopsy on 10/29/2017.  He has a history of metastatic cholangiocarcinoma, hypertension, diabetes, prior colon cancer, cirrhosis.  He was admitted to [facility] on 11/27 with enterobacter/E. coli bacteremia.  He is currently on IV meropenem.  Recent CT shows stable position of the biliary drain, stable intrahepatic biliary ductal dilatation of the left lobe, stable left liver hilar mass and multiple hepatic metastases.  No abscess identified.  Mild increase in upper abdominal ascites, stable pulmonary/adrenal/mesenteric retroperitoneal lymph node metastases.  Request now received from oncology for attempted biliary stent placement as well as Port-A-Cath placement.  Patient just finished walking in the hallway.  Currently denies fever, headache, chest pain, worsening dyspnea, cough, worsening abdominal/back pain, nausea, vomiting or bleeding.  He is afebrile and biliary drain has good output.  Past Medical History:  Diagnosis Date  . Abscess of anal and rectal regions    horse shoe abscess  . Alcoholic cirrhosis ([place])   . Arthritis    "everywhere; especially in my spine"  . Cirrhosis ([place])    STAGE 1 CIRRHOSIS PATIENT SEES DR NAMDIGAM FOR  . Colon cancer ([place]) 05/2005   TX SURGERY WITH LYMPH NODE REMOVAL  . Cough    LAST FEW WEEKS SAW DR RAMOS LUNGS MILKY SPUTUM OCC  . DM type 2 with diabetic peripheral neuropathy (HCC)    left foot  . Fatigue   . Fistula, anal    multiple  . History of substance abuse ([place])    last alcohol was 08/2011; last marijuania was 08/2011  . Hypertension   . PTSD (post-traumatic stress disorder)    "severe" HX OF  . Sleep  deprivation    Past Surgical History:  Procedure Laterality Date  . ABSCESS DRAINAGE     "probably 15-20 so far at least; rectal"  . ANAL FISTULECTOMY  12/13/1998  . ANTERIOR FUSION CERVICAL SPINE  2010   "triple"; "did 2 surgeries on the same day in 8115 due to complications"  . CORONARY ANGIOPLASTY  2001  . detached muscle  2010   right chest; "after cervical fusion complications"  . ELBOW SURGERY  2007   "cut out part of a muscle"; left  . EXAMINATION UNDER ANESTHESIA  03/22/2005   fistula  . EXAMINATION UNDER ANESTHESIA  06/06/2011   Procedure: EXAM UNDER ANESTHESIA;  Surgeon: Stark Klein, MD;  Location: [place];  Service: General;  Laterality: N/A;  . HEMICOLECTOMY  06/06/2005   left  . INCISION AND DRAINAGE PERIRECTAL ABSCESS  03/30/2005  . INCISION AND DRAINAGE PERIRECTAL ABSCESS  12/13/2004  . INCISION AND DRAINAGE PERIRECTAL ABSCESS  07/14/2001  . INCISION AND DRAINAGE PERIRECTAL ABSCESS  08/12/2010   horseshoe abscess; Dr Redmond Pulling  . INCISION AND DRAINAGE PERIRECTAL ABSCESS  06/06/2011   Procedure: IRRIGATION AND DEBRIDEMENT PERIRECTAL ABSCESS;  Surgeon: Stark Klein, MD;  Location: [place];  Service: General;  Laterality: N/A;  . INCISION AND DRAINAGE PERIRECTAL ABSCESS N/A 02/24/2017   Procedure: IRRIGATION AND DEBRIDEMENT PERIRECTAL ABSCESS;  Surgeon: Ralene Ok, MD;  Location: [place];  Service: General;  Laterality: N/A;  . IR EXCHANGE BILIARY DRAIN  11/07/2017  . IR INT EXT BILIARY DRAIN WITH CHOLANGIOGRAM  10/26/2017  . IR PATIENT EVAL TECH 0-60 MINS  12/02/2017  . IR RADIOLOGIST EVAL & MGMT  11/06/2017  . [place]  . PERCUTANEOUS PINNING PHALANX FRACTURE OF HAND  ~ 2008   "plates in 2 places"; right  . PLACEMENT OF SETON N/A 09/05/2017   Procedure: PLACEMENT OF SETON;  Surgeon: Leighton Ruff, MD;  Location: [facility];  Service: General;  Laterality: N/A;  . RADIOLOGY WITH ANESTHESIA N/A 10/25/2017   Procedure: MRI WITH ANESTHESIA;   Surgeon: Radiologist, Medication, MD;  Location: [place];  Service: Radiology;  Laterality: N/A;  . THORACIC DISCECTOMY  1990's     Allergies: Morphine and related  Medications: Prior to Admission medications   Medication Sig Start Date End Date Taking? Authorizing Provider  aspirin EC 81 MG tablet Take 81 mg by mouth daily.   Yes [provider]  atorvastatin (LIPITOR) 40 MG tablet Take 1 tablet (40 mg total) by mouth daily at 6 PM. 12/02/12  Yes Viyuoh, Adeline C, MD  calcium carbonate (TUMS - DOSED IN MG ELEMENTAL CALCIUM) 500 MG chewable tablet Chew 1 tablet by mouth 2 (two) times daily as needed for indigestion or heartburn.   Yes [provider]  celecoxib (CELEBREX) 200 MG capsule Take 200 mg by mouth at bedtime.  10/10/17  Yes [provider]  empagliflozin (JARDIANCE) 10 MG TABS tablet Take 10 mg by mouth at bedtime.    Yes [provider]  gabapentin (NEURONTIN) 400 MG capsule Take 400-800 mg by mouth See admin instructions. Take 400 mg by mouth in the morning and 800 mg in the evening   Yes [provider]  glipiZIDE (GLUCOTROL) 5 MG tablet Take 5 mg by mouth 2 (two) times daily before a meal.   Yes [provider]  Insulin Detemir (LEVEMIR FLEXPEN) 100 UNIT/ML Pen Inject 10-62 Units into the skin See admin instructions. Take 62 units subq in the morning and 10 units in the evening   Yes [provider]  lisinopril (PRINIVIL,ZESTRIL) 20 MG tablet Take 10 mg by mouth daily.    Yes [provider]  metFORMIN (GLUCOPHAGE) 1000 MG tablet Take 1,000 mg by mouth 2 (two) times daily with a meal.   Yes [provider]  Multiple Vitamin (MULTIVITAMIN) tablet Take 1 tablet by mouth daily.   Yes [provider]  ondansetron (ZOFRAN) 8 MG tablet Take 1 tablet (8 mg total) by mouth 2 (two) times daily as needed. Start on the third day after chemotherapy. 11/06/17  Yes [name], MD  polyvinyl alcohol  (LIQUIFILM TEARS) 1.4 % ophthalmic solution Place 1 drop into both eyes daily as needed for dry eyes.    Yes [provider]  prochlorperazine (COMPAZINE) 10 MG tablet Take 1 tablet (10 mg total) by mouth every 6 (six) hours as needed (Nausea or vomiting). 11/06/17  Yes [name], MD  oxyCODONE 10 MG TABS Take 1 tablet (10 mg total) by mouth every 4 (four) hours as needed for severe pain. Patient not taking: Reported on 12/03/2017 10/29/17   Nita Sells, MD     Vital Signs: BP 104/72 (BP Location: Right Arm)   Pulse 86   Temp 98.3 F (36.8 C) (Oral)   Resp 20   Ht _0  (1.803 m)   Wt 213 lb 6.5 oz (96.8 kg)   SpO2 100%   BMI 29.76 kg/m   Physical Exam awake, alert.  Chest clear to auscultation bilaterally anteriorly.  Heart with regular rate and rhythm.  Abdomen soft, positive  bowel sounds, intact biliary drain with slightly hazy yellow/green bile in bag; trace -1+ pretibial edema bilaterally.  Imaging: No results found.  Labs:  CBC: Recent Labs    12/04/17 0846 12/05/17 0727 12/07/17 0415 12/08/17 0342  WBC 8.3 7.0 4.0 5.8  HGB 8.3* 8.4* 8.0* 8.7*  HCT 26.2* 26.7* 26.0* 28.0*  PLT 125* 125* 123* 149*    COAGS: Recent Labs    10/26/17 0539 11/08/17 2026 11/20/17 1500 12/03/17 0953  INR 1.12 1.18 1.22 1.51    BMP: Recent Labs    12/05/17 0727 12/06/17 0530 12/07/17 0415 12/08/17 0342  NA 130* 134* 136 136  K 3.7 3.9 3.7 3.7  CL 105 107 109 109  CO2 18* 20* 22 21*  GLUCOSE 145* 130* 132* 137*  BUN _0 CALCIUM 8.2* 8.1* 8.1* 8.3*  CREATININE 0.52* 0.56* 0.66 0.46*  GFRNONAA >60 >60 >60 >60  GFRAA >60 >60 >60 >60    LIVER FUNCTION TESTS: Recent Labs    12/05/17 0727 12/06/17 0530 12/07/17 0415 12/08/17 0342  BILITOT 2.6* 2.2* 2.0* 1.8*  AST 43* _1 ALT 37 _2 ALKPHOS 201* 203* 225* 310*  PROT 6.7 6.4* 6.3* 6.6  ALBUMIN 2.5* 2.2* 2.2* 2.3*    Assessment and Plan: Pt with history of metastatic  cholangiocarcinoma, hypertension, diabetes, prior colon cancer, cirrhosis.  He was admitted to [facility] on 11/27 with enterobacter/E. coli bacteremia.  He is currently on IV meropenem.  Recent CT shows stable position of the biliary drain, stable intrahepatic biliary ductal dilatation of the left lobe, stable left liver hilar mass and multiple hepatic metastases.  No abscess identified.  Mild increase in upper abdominal ascites, stable pulmonary/adrenal/mesenteric retroperitoneal lymph node metastases.  Request now received from oncology for attempted biliary stent placement as well as Port-A-Cath placement.  Imaging studies have been reviewed by Dr. Kathlene Cote.  Details/risks of procedures, including but not limited to, internal bleeding, infection, injury to adjacent structures, inability to place biliary stent discussed with patient and family with their understanding and consent.  Procedure tentatively scheduled for 12/3.  Will need to hold Lovenox until after above procedures.  Check labs in a.m.   Electronically Signed: D. Rowe Robert, PA-C 12/08/2017, 10:13 AM   I spent a total of 30 minutes at the the patient's bedside AND on the patient's hospital floor or unit, greater than 50% of which was counseling/coordinating care for Port-A-Cath and biliary stent placements    Patient ID: Matthew Lewis, male   DOB: August 26, 1955, 62 y.o.   MRN: [id number]

## 2017-12-08 NOTE — Progress Notes (Signed)
[facility] PROGRESS NOTE  Matthew Lewis WUG:[id number] DOB: Aug 01, 1955 DOA: 12/03/2017  PCP: [name], MD  Brief History/Interval Summary:  62 y.o. male with medical history significant for intrahepatic cholangiocarcinoma, metastatic to liver and lung, type 2 diabetes, prior history of alcohol abuse, cirrhosis, status post percutaneous biliary drainage drainage tube for biliary obstruction who presented on 12/03/2017 with several days of rigors, fevers, nausea and vomiting and generalized malaise.  On 11/26 patient was seen by IR for biliary drain flushing.  On the same day he was seen by oncology.  At that time he had complained of abdominal pain at the site of where the drain was flushed, he had also reported significant amount of pressure during that procedure.  He was afebrile at that evaluation but found to have a WBC of 14.2, complaints of rigors, complaints of nausea and emesis x1.  Blood cultures x2 and urine cultures were obtained and he received one-time dose of Rocephin 2 g IV.  He was also given IV Zofran and 1 L of normal saline for suspected dehydration. He was sent home with Keflex due to his prior history of Klebsiella infection. Patient was hospitalized for further management.  Patient was given Zosyn.  Cultures positive for ESBL E. coli.  Changed over to meropenem.  Reason for Visit: ESBL E. coli bacteremia.    Consultants: Medical oncology.  Interventional radiology.  Critical care medicine  Procedures: Central line placement in the right IJ  Antibiotics: Zosyn till 11/29 Meropenem starting 11/29  Subjective/Interval History: Patient denies any complaints.  He ambulated this morning.  Denies any abdominal pain nausea or vomiting.  ROS: Denies any headaches  Objective:  Vital Signs  Vitals:   12/07/17 1241 12/07/17 1351 12/07/17 2217 12/08/17 0404  BP: 101/68 106/70 98/69 104/72  Pulse: 92 95 94 86  Resp: 20 18 20 20   Temp: 98.2 F (36.8 C)  98.3 F (36.8 C) 98.3 F (36.8 C) 98.3 F (36.8 C)  TempSrc: Oral Oral Oral Oral  SpO2: 99% 99% 99% 100%  Weight:      Height:        Intake/Output Summary (Last 24 hours) at 12/08/2017 1320 Last data filed at 12/08/2017 0845 Gross per 24 hour  Intake 2040 ml  Output 3875 ml  Net -1835 ml   Filed Weights   12/03/17 0930 12/05/17 0500 12/07/17 0421  Weight: 86 kg 94.9 kg 96.8 kg    General appearance: Patient is awake alert.  In no distress Resp: Normal effort at rest.  Clear to auscultation bilaterally Cardio: S1-S2 is normal regular.  No S3-S4.  No rubs murmurs or bruit GI: Abdomen is soft.  Nontender nondistended biliary drain is present. Neurologic: Alert and oriented x3.  No focal neurological deficits.  Lab Results:  Data Reviewed: I have personally reviewed following labs and imaging studies  CBC: Recent Labs  Lab 12/01/17 1529 12/02/17 1252 12/03/17 0953 12/04/17 0846 12/05/17 0727 12/07/17 0415 12/08/17 0342  WBC 12.3* 14.2* 7.8 8.3 7.0 4.0 5.8  NEUTROABS 9.3* 13.4* 7.2  --   --   --   --   HGB 12.4* 11.8* 10.4* 8.3* 8.4* 8.0* 8.7*  HCT 39.5 36.8* 33.2* 26.2* 26.7* 26.0* 28.0*  MCV 92.5 92.7 95.4 95.3 93.0 91.9 91.8  PLT 326 248 146* 125* 125* 123* 149*    Basic Metabolic Panel: Recent Labs  Lab 12/04/17 0846 12/05/17 0727 12/06/17 0530 12/07/17 0415 12/08/17 0342  NA 133* 130* 134* 136 136  K  3.5 3.7 3.9 3.7 3.7  CL 108 105 107 109 109  CO2 18* 18* 20* 22 21*  GLUCOSE 174* 145* 130* 132* 137*  BUN 17 12 11 12 12   CREATININE 0.69 0.52* 0.56* 0.66 0.46*  CALCIUM 8.2* 8.2* 8.1* 8.1* 8.3*    GFR: Estimated Creatinine Clearance: 110.7 mL/min (A) (by C-G formula based on SCr of 0.46 mg/dL (L)).  Liver Function Tests: Recent Labs  Lab 12/04/17 0846 12/05/17 0727 12/06/17 0530 12/07/17 0415 12/08/17 0342  AST 61* 43* 30 28 29   ALT 43 37 31 28 30   ALKPHOS 210* 201* 203* 225* 310*  BILITOT 3.1* 2.6* 2.2* 2.0* 1.8*  PROT 6.7 6.7 6.4* 6.3*  6.6  ALBUMIN 2.6* 2.5* 2.2* 2.2* 2.3*    Coagulation Profile: Recent Labs  Lab 12/03/17 0953  INR 1.51     CBG: Recent Labs  Lab 12/07/17 1238 12/07/17 1639 12/07/17 2253 12/08/17 0812 12/08/17 1203  GLUCAP 133* 143* 135* 142* 158*     Recent Results (from the past 240 hour(s))  Culture, Blood     Status: None   Collection Time: 12/02/17 12:51 PM  Result Value Ref Range Status   Specimen Description BLOOD LEFT HAND  Final   Special Requests   Final    BOTTLES DRAWN AEROBIC AND ANAEROBIC Blood Culture adequate volume   Culture   Final    NO GROWTH 5 DAYS Performed at [facility], [place] [address] [place], [place] [address]    Report Status 12/07/2017 FINAL  Final  Culture, Blood     Status: Abnormal   Collection Time: 12/02/17 12:51 PM  Result Value Ref Range Status   Specimen Description BLOOD RIGHT HAND  Final   Special Requests   Final    BOTTLES DRAWN AEROBIC AND ANAEROBIC Blood Culture adequate volume   Culture  Setup Time   Final    GRAM NEGATIVE RODS ANAEROBIC BOTTLE ONLY Organism ID to follow    Culture (A)  Final    ESCHERICHIA COLI SUSCEPTIBILITIES PERFORMED ON PREVIOUS CULTURE WITHIN THE LAST 5 DAYS. Performed at [facility], [place] [address], [place], [place] [address]    Report Status 12/05/2017 FINAL  Final  Blood Culture ID Panel (Reflexed)     Status: Abnormal   Collection Time: 12/02/17 12:51 PM  Result Value Ref Range Status   Enterococcus species NOT DETECTED NOT DETECTED Final   Listeria monocytogenes NOT DETECTED NOT DETECTED Final   Staphylococcus species NOT DETECTED NOT DETECTED Final   Staphylococcus aureus (BCID) NOT DETECTED NOT DETECTED Final   Streptococcus species NOT DETECTED NOT DETECTED Final   Streptococcus agalactiae NOT DETECTED NOT DETECTED Final   Streptococcus pneumoniae NOT DETECTED NOT DETECTED Final   Streptococcus pyogenes NOT DETECTED NOT DETECTED Final   Acinetobacter baumannii NOT DETECTED NOT  DETECTED Final   Enterobacteriaceae species DETECTED (A) NOT DETECTED Final    Comment: Enterobacteriaceae represent a large family of gram-negative bacteria, not a single organism. CRITICAL RESULT CALLED TO, READ BACK BY AND VERIFIED WITH: E.WILLIAMSON,PHARMD AT 2145 ON 12/03/17 BY G.MCADOO    Enterobacter cloacae complex NOT DETECTED NOT DETECTED Final   Escherichia coli DETECTED (A) NOT DETECTED Final    Comment: CRITICAL RESULT CALLED TO, READ BACK BY AND VERIFIED WITH: E.WILLIAMSON,PHARMD AT 2145 ON 12/03/17 BY G.MCADOO    Klebsiella oxytoca NOT DETECTED NOT DETECTED Final   Klebsiella pneumoniae NOT DETECTED NOT DETECTED Final   Proteus species NOT DETECTED NOT DETECTED Final  Serratia marcescens NOT DETECTED NOT DETECTED Final   Carbapenem resistance NOT DETECTED NOT DETECTED Final   Haemophilus influenzae NOT DETECTED NOT DETECTED Final   Neisseria meningitidis NOT DETECTED NOT DETECTED Final   Pseudomonas aeruginosa NOT DETECTED NOT DETECTED Final   Candida albicans NOT DETECTED NOT DETECTED Final   Candida glabrata NOT DETECTED NOT DETECTED Final   Candida krusei NOT DETECTED NOT DETECTED Final   Candida parapsilosis NOT DETECTED NOT DETECTED Final   Candida tropicalis NOT DETECTED NOT DETECTED Final    Comment: Performed at [facility], [place] [address], [place], [place] [address]  Culture, blood (routine x 2)     Status: Abnormal   Collection Time: 12/03/17  9:54 AM  Result Value Ref Range Status   Specimen Description   Final    LEFT ANTECUBITAL Performed at [place] [address], [place], [place] [address]    Special Requests   Final    BOTTLES DRAWN AEROBIC AND ANAEROBIC Blood Culture adequate volume Performed at [place] [address], [place], [place] [address]    Culture  Setup Time   Final    GRAM NEGATIVE RODS IN BOTH AEROBIC AND ANAEROBIC BOTTLES CRITICAL VALUE NOTED.  VALUE IS CONSISTENT WITH  PREVIOUSLY REPORTED AND CALLED VALUE.    Culture (A)  Final    ESCHERICHIA COLI SUSCEPTIBILITIES PERFORMED ON PREVIOUS CULTURE WITHIN THE LAST 5 DAYS. Performed at [facility], [place] [address] [place], [place] [address]    Report Status 12/08/2017 FINAL  Final  Culture, blood (routine x 2)     Status: Abnormal   Collection Time: 12/03/17 10:44 AM  Result Value Ref Range Status   Specimen Description   Final    BLOOD LEFT HAND Performed at [facility] [address], [place], [place] [address]    Special Requests   Final    BOTTLES DRAWN AEROBIC AND ANAEROBIC Blood Culture adequate volume Performed at [facility] [address], [place], [place] [address]    Culture  Setup Time   Final    GRAM NEGATIVE RODS IN BOTH AEROBIC AND ANAEROBIC BOTTLES Organism ID to follow CRITICAL RESULT CALLED TO, READ BACK BY AND VERIFIED WITH: E.WILLIAMSON,PHARMD AT 2145 ON 12/03/17 BY G.MCADOO Performed at [facility], [place] [address], [place],  [address]    Culture (A)  Final    ESCHERICHIA COLI Confirmed Extended Spectrum Beta-Lactamase Producer (ESBL).  In bloodstream infections from ESBL organisms, carbapenems are preferred over piperacillin/tazobactam. They are shown to have a lower risk of mortality.    Report Status 12/05/2017 FINAL  Final   Organism ID, Bacteria ESCHERICHIA COLI  Final      Susceptibility   Escherichia coli - MIC*    AMPICILLIN >=32 RESISTANT Resistant     CEFAZOLIN >=64 RESISTANT Resistant     CEFEPIME RESISTANT Resistant     CEFTAZIDIME >=64 RESISTANT Resistant     CEFTRIAXONE >=64 RESISTANT Resistant     CIPROFLOXACIN 1 SENSITIVE Sensitive     GENTAMICIN <=1 SENSITIVE Sensitive     IMIPENEM 1 SENSITIVE Sensitive     TRIMETH/SULFA <=20 SENSITIVE Sensitive     AMPICILLIN/SULBACTAM >=32 RESISTANT Resistant     PIP/TAZO >=128 RESISTANT Resistant     Extended ESBL POSITIVE Resistant     * ESCHERICHIA COLI    Blood Culture ID Panel (Reflexed)     Status: Abnormal   Collection Time: 12/03/17 10:44  AM  Result Value Ref Range Status   Enterococcus species NOT DETECTED NOT DETECTED Final   Listeria monocytogenes NOT DETECTED NOT DETECTED Final   Staphylococcus species NOT DETECTED NOT DETECTED Final   Staphylococcus aureus (BCID) NOT DETECTED NOT DETECTED Final   Streptococcus species NOT DETECTED NOT DETECTED Final   Streptococcus agalactiae NOT DETECTED NOT DETECTED Final   Streptococcus pneumoniae NOT DETECTED NOT DETECTED Final   Streptococcus pyogenes NOT DETECTED NOT DETECTED Final   Acinetobacter baumannii NOT DETECTED NOT DETECTED Final   Enterobacteriaceae species DETECTED (A) NOT DETECTED Corrected    Comment: Enterobacteriaceae represent a large family of gram-negative bacteria, not a single organism. CRITICAL RESULT CALLED TO, READ BACK BY AND VERIFIED WITH: E.WILLIAMSON,PHARMD AT 2145 ON 12/03/17 BY G.MCADOO CORRECTED ON 11/27 AT 2249: PREVIOUSLY REPORTED AS DETECTED Enterobacteriaceae represent a large family of gram negative bacteria, not a single organism. CRITICAL RESULT CALLED TO, READ BACK BY AND VERIFIED WITH: H.BAIRD,PHARMD AT 2143 ON 12/03/17 BY  G.MCADOO    Enterobacter cloacae complex NOT DETECTED NOT DETECTED Final   Escherichia coli DETECTED (A) NOT DETECTED Corrected    Comment: CRITICAL RESULT CALLED TO, READ BACK BY AND VERIFIED WITH: E.WILLIAMSON,PHARMD AT 2145 ON 12/03/17 BY G.MCADOO CORRECTED ON 11/27 AT 2249: PREVIOUSLY REPORTED AS DETECTED CRITICAL RESULT CALLED TO, READ BACK BY AND VERIFIED WITH: H.BAIRD,PHARMD AT 2143 ON 12/03/17 BY G.MCADOO    Klebsiella oxytoca NOT DETECTED NOT DETECTED Final   Klebsiella pneumoniae NOT DETECTED NOT DETECTED Final   Proteus species NOT DETECTED NOT DETECTED Final   Serratia marcescens NOT DETECTED NOT DETECTED Final   Haemophilus influenzae NOT DETECTED NOT DETECTED Final   Neisseria meningitidis NOT DETECTED NOT DETECTED  Final   Pseudomonas aeruginosa NOT DETECTED NOT DETECTED Final   Candida albicans NOT DETECTED NOT DETECTED Final   Candida glabrata NOT DETECTED NOT DETECTED Final   Candida krusei NOT DETECTED NOT DETECTED Final   Candida parapsilosis NOT DETECTED NOT DETECTED Final   Candida tropicalis NOT DETECTED NOT DETECTED Final    Comment: Performed at [facility], [place] [address], [place], [place] [address]      Radiology Studies: No results found.   Medications:  Scheduled: . celecoxib  200 mg Oral QPM  . enoxaparin (LOVENOX) injection  40 mg Subcutaneous Q24H  . gabapentin  400 mg Oral Daily  . gabapentin  800 mg Oral QPM  . lactose free nutrition  237 mL Oral TID WC  . loratadine  10 mg Oral Daily  . sodium chloride flush  10-40 mL Intracatheter Q12H  . sodium chloride flush  10-40 mL Intracatheter Q12H   Continuous: . dextrose 5 % and 0.9% NaCl 10 mL/hr at 12/08/17 0612  . meropenem (MERREM) IV 1 g (12/08/17 0842)  . sodium chloride     HDQ:[id number] **OR** acetaminophen, ondansetron **OR** ondansetron (ZOFRAN) IV, oxyCODONE, polyvinyl alcohol, senna-docusate, sodium chloride flush, sodium chloride flush, zolpidem    Assessment/Plan:  Septic shock secondary to ESBL E. coli bacteremia with lactic acidosis Most likely biliary source considering his previous history and presence of biliary drain.  CT scan of the abdomen showed that the drain was in good position.  Stable ductal dilatation and masses noted compared to previous imaging studies.  Patient was seen by critical care medicine.  Central line was placed.  Patient was placed on pressors.  Blood pressures improved.  Patient was weaned off of Levophed.  He was transferred to the floor.  Blood pressure has been  stable.  Cortisol level 14.8.  Zosyn was changed over to meropenem as he was found to have E. coli ESBL bacteremia.  Sensitivities were reviewed.  Lactic acid levels improved.  Plan is for Port-A-Cath placement as  well as biliary stent procedure on Tuesday.  Will need to consult or discuss with ID regarding further management of his bacteremia.  History of cholangiocarcinoma with metastases with presence of biliary drain Patient has had recurrent issues with his drain with bacteremia.  Medical oncology and interventional radiology have seen the patient.  Plan is for intervention by interventional radiology on 12/3.  LFTs are stable.  Mild hyperbilirubinemia is noted.  Alkaline phosphatase is also mildly elevated.  Mild hyponatremia, due to hypovolemia Improved  Normocytic anemia Baseline hemoglobin around 9-10.  Drop in hemoglobin likely dilutional.  Hemoglobin low but stable.  Mild acute kidney injury Renal function is back to baseline.  Monitor urine output.  Diabetes mellitus type 2 with hyperglycemia/peripheral neuropathy HbA1c 4.9 on November 3.  Patient has had a few hypoglycemic episodes in the hospital which could be due to sepsis.  He is on a regular diet.  CBGs are normal now.  Patient is on oral hypoglycemics at home which has been held.  Considering his low HbA1c may need to stop this altogether.  Continue gabapentin.  Patient also on Celebrex which he requested be initiated.  Essential hypertension Blood pressures have improved.  Continue to hold lisinopril.  DVT Prophylaxis: Lovenox    Code Status: DNR Family Communication: Discussed with the patient Disposition Plan: Management as outlined above.  Await procedure by interventional radiology tomorrow.    LOS: 5 days   [facility] Pager [phone] 12/08/2017, 1:20 PM  If 7PM-7AM, please contact night-coverage at [email or web address], password [facility]

## 2017-12-08 NOTE — Progress Notes (Signed)
Physical Therapy Treatment Patient Details Name: Matthew Lewis MRN: [id number] DOB: 1955/12/31 Today's Date: 12/08/2017    History of Present Illness 62 y.o. male with medical history significant for intrahepatic cholangiocarcinoma, metastatic to liver and lung, type 2 diabetes, prior history of alcohol abuse, cirrhosis, status post percutaneous biliary drainage drainage tube for biliary obstruction who presented on 12/03/2017 with several days of rigors, fevers, nausea and vomiting and generalized malaise.     PT Comments    Patient progressing with distance with ambulation and with less pain this session.  Current plans remain appropriate.  Will continue skilled PT and practice steps when able.    Follow Up Recommendations  No PT follow up     Equipment Recommendations  None recommended by PT    Recommendations for Other Services       Precautions / Restrictions Precautions Precaution Comments: drain on right with leg bag Restrictions Weight Bearing Restrictions: No    Mobility  Bed Mobility Overal bed mobility: Independent                Transfers Overall transfer level: Modified independent                  Ambulation/Gait Ambulation/Gait assistance: Supervision Gait Distance (Feet): 800 Feet   Gait Pattern/deviations: Step-through pattern;Decreased stride length     General Gait Details: slow pace, but steady and maneuvering around obstacles with increased time and supervision    Stairs Stairs: (offered to practice pt prefers to wait until no longer on IV's)           Wheelchair Mobility    Modified Rankin (Stroke Patients Only)       Balance Overall balance assessment: No apparent balance deficits (not formally assessed)                                          Cognition Arousal/Alertness: Awake/alert Behavior During Therapy: WFL for tasks assessed/performed Overall Cognitive Status: Within Functional Limits  for tasks assessed                                        Exercises      General Comments        Pertinent Vitals/Pain Pain Assessment: No/denies pain    Home Living                      Prior Function            PT Goals (current goals can now be found in the care plan section) Progress towards PT goals: Progressing toward goals    Frequency    Min 3X/week      PT Plan Current plan remains appropriate    Co-evaluation              AM-PAC PT "6 Clicks" Mobility   Outcome Measure  Help needed turning from your back to your side while in a flat bed without using bedrails?: None Help needed moving from lying on your back to sitting on the side of a flat bed without using bedrails?: None Help needed moving to and from a bed to a chair (including a wheelchair)?: None Help needed standing up from a chair using your arms (e.g., wheelchair or bedside chair)?: A Little Help  needed to walk in hospital room?: A Little Help needed climbing 3-5 steps with a railing? : A Little 6 Click Score: 21    End of Session   Activity Tolerance: Patient tolerated treatment well Patient left: in bed;with call bell/phone within reach;Other (comment)(PA present)   PT Visit Diagnosis: Difficulty in walking, not elsewhere classified (R26.2)     Time: 0940-1005 PT Time Calculation (min) (ACUTE ONLY): 25 min  Charges:  $Gait Training: 23-37 mins                     Magda Kiel, [place] [phone] 12/08/2017    Reginia Naas 12/08/2017, 10:58 AM

## 2017-12-08 NOTE — Progress Notes (Signed)
Matthew Lewis   DOB:31-Oct-1955   SW#:[id number]   DUK#:[id number]  Oncology follow up   Subjective: Patient is doing better, vital signs are stable, afebrile, eating better, able to move around in the hallway.   Objective:  Vitals:   12/08/17 0404 12/08/17 1334  BP: 104/72 112/68  Pulse: 86 93  Resp: 20 20  Temp: 98.3 F (36.8 C) 98.2 F (36.8 C)  SpO2: 100% 100%    Body mass index is 29.76 kg/m.  Intake/Output Summary (Last 24 hours) at 12/08/2017 1751 Last data filed at 12/08/2017 1647 Gross per 24 hour  Intake 1680 ml  Output 3400 ml  Net -1720 ml     Sclerae unicteric  Oropharynx clear  No peripheral adenopathy  Lungs clear -- no rales or rhonchi  Heart regular rate and rhythm  Abdomen benign, percutaneous biliary drain tube in the right upper quadrant  MSK no focal spinal tenderness, no peripheral edema  Neuro nonfocal    CBG (last 3)  Recent Labs    12/08/17 0812 12/08/17 1203 12/08/17 1714  GLUCAP 142* 158* 122*     Labs:  Lab Results  Component Value Date   WBC 5.8 12/08/2017   HGB 8.7 (L) 12/08/2017   HCT 28.0 (L) 12/08/2017   MCV 91.8 12/08/2017   PLT 149 (L) 12/08/2017   NEUTROABS 7.2 12/03/2017   CMP Latest Ref Rng & Units 12/08/2017 12/07/2017 12/06/2017  Glucose 70 - 99 mg/dL 137(H) 132(H) 130(H)  BUN 8 - 23 mg/dL 12 12 11   Creatinine 0.61 - 1.24 mg/dL 0.46(L) 0.66 0.56(L)  Sodium 135 - 145 mmol/L 136 136 134(L)  Potassium 3.5 - 5.1 mmol/L 3.7 3.7 3.9  Chloride 98 - 111 mmol/L 109 109 107  CO2 22 - 32 mmol/L 21(L) 22 20(L)  Calcium 8.9 - 10.3 mg/dL 8.3(L) 8.1(L) 8.1(L)  Total Protein 6.5 - 8.1 g/dL 6.6 6.3(L) 6.4(L)  Total Bilirubin 0.3 - 1.2 mg/dL 1.8(H) 2.0(H) 2.2(H)  Alkaline Phos 38 - 126 U/L 310(H) 225(H) 203(H)  AST 15 - 41 U/L 29 28 30   ALT 0 - 44 U/L 30 28 31      Urine Studies No results for input(s): UHGB, CRYS in the last 72 hours.  Invalid input(s): UACOL, UAPR, USPG, UPH, UTP, UGL, UKET, UBIL, UNIT, UROB, Sylvia, UEPI,  UWBC, Duwayne Heck Villanue[place], [place]  Basic Metabolic Panel: Recent Labs  Lab 12/04/17 0846 12/05/17 0727 12/06/17 0530 12/07/17 0415 12/08/17 0342  NA 133* 130* 134* 136 136  K 3.5 3.7 3.9 3.7 3.7  CL 108 105 107 109 109  CO2 18* 18* 20* 22 21*  GLUCOSE 174* 145* 130* 132* 137*  BUN 17 12 11 12 12   CREATININE 0.69 0.52* 0.56* 0.66 0.46*  CALCIUM 8.2* 8.2* 8.1* 8.1* 8.3*   GFR Estimated Creatinine Clearance: 110.7 mL/min (A) (by C-G formula based on SCr of 0.46 mg/dL (L)). Liver Function Tests: Recent Labs  Lab 12/04/17 0846 12/05/17 0727 12/06/17 0530 12/07/17 0415 12/08/17 0342  AST 61* 43* 30 28 29   ALT 43 37 31 28 30   ALKPHOS 210* 201* 203* 225* 310*  BILITOT 3.1* 2.6* 2.2* 2.0* 1.8*  PROT 6.7 6.7 6.4* 6.3* 6.6  ALBUMIN 2.6* 2.5* 2.2* 2.2* 2.3*   No results for input(s): LIPASE, AMYLASE in the last 168 hours. No results for input(s): AMMONIA in the last 168 hours. Coagulation profile Recent Labs  Lab 12/03/17 0953  INR 1.51    CBC: Recent Labs  Lab 12/02/17 1252 12/03/17  9292 12/04/17 0846 12/05/17 0727 12/07/17 0415 12/08/17 0342  WBC 14.2* 7.8 8.3 7.0 4.0 5.8  NEUTROABS 13.4* 7.2  --   --   --   --   HGB 11.8* 10.4* 8.3* 8.4* 8.0* 8.7*  HCT 36.8* 33.2* 26.2* 26.7* 26.0* 28.0*  MCV 92.7 95.4 95.3 93.0 91.9 91.8  PLT 248 146* 125* 125* 123* 149*   Cardiac Enzymes: No results for input(s): CKTOTAL, CKMB, CKMBINDEX, TROPONINI in the last 168 hours. BNP: Invalid input(s): POCBNP CBG: Recent Labs  Lab 12/07/17 1639 12/07/17 2253 12/08/17 0812 12/08/17 1203 12/08/17 1714  GLUCAP 143* 135* 142* 158* 122*   D-Dimer No results for input(s): DDIMER in the last 72 hours. Hgb A1c No results for input(s): HGBA1C in the last 72 hours. Lipid Profile No results for input(s): CHOL, HDL, LDLCALC, TRIG, CHOLHDL, LDLDIRECT in the last 72 hours. Thyroid function studies No results for input(s): TSH, T4TOTAL, T3FREE, THYROIDAB in the last 72  hours.  Invalid input(s): FREET3 Anemia work up No results for input(s): VITAMINB12, FOLATE, FERRITIN, TIBC, IRON, RETICCTPCT in the last 72 hours. Microbiology Recent Results (from the past 240 hour(s))  Culture, Blood     Status: None   Collection Time: 12/02/17 12:51 PM  Result Value Ref Range Status   Specimen Description BLOOD LEFT HAND  Final   Special Requests   Final    BOTTLES DRAWN AEROBIC AND ANAEROBIC Blood Culture adequate volume   Culture   Final    NO GROWTH 5 DAYS Performed at [facility], [address] [place], [place] [address]    Report Status 12/07/2017 FINAL  Final  Culture, Blood     Status: Abnormal   Collection Time: 12/02/17 12:51 PM  Result Value Ref Range Status   Specimen Description BLOOD RIGHT HAND  Final   Special Requests   Final    BOTTLES DRAWN AEROBIC AND ANAEROBIC Blood Culture adequate volume   Culture  Setup Time   Final    GRAM NEGATIVE RODS ANAEROBIC BOTTLE ONLY Organism ID to follow    Culture (A)  Final    ESCHERICHIA COLI SUSCEPTIBILITIES PERFORMED ON PREVIOUS CULTURE WITHIN THE LAST 5 DAYS. Performed at [facility], [place] [address], [place], [place] [address]    Report Status 12/05/2017 FINAL  Final  Blood Culture ID Panel (Reflexed)     Status: Abnormal   Collection Time: 12/02/17 12:51 PM  Result Value Ref Range Status   Enterococcus species NOT DETECTED NOT DETECTED Final   Listeria monocytogenes NOT DETECTED NOT DETECTED Final   Staphylococcus species NOT DETECTED NOT DETECTED Final   Staphylococcus aureus (BCID) NOT DETECTED NOT DETECTED Final   Streptococcus species NOT DETECTED NOT DETECTED Final   Streptococcus agalactiae NOT DETECTED NOT DETECTED Final   Streptococcus pneumoniae NOT DETECTED NOT DETECTED Final   Streptococcus pyogenes NOT DETECTED NOT DETECTED Final   Acinetobacter baumannii NOT DETECTED NOT DETECTED Final   Enterobacteriaceae species DETECTED (A) NOT DETECTED Final    Comment:  Enterobacteriaceae represent a large family of gram-negative bacteria, not a single organism. CRITICAL RESULT CALLED TO, READ BACK BY AND VERIFIED WITH: E.WILLIAMSON,PHARMD AT 2145 ON 12/03/17 BY G.MCADOO    Enterobacter cloacae complex NOT DETECTED NOT DETECTED Final   Escherichia coli DETECTED (A) NOT DETECTED Final    Comment: CRITICAL RESULT CALLED TO, READ BACK BY AND VERIFIED WITH: E.WILLIAMSON,PHARMD AT 2145 ON 12/03/17 BY G.MCADOO    Klebsiella oxytoca NOT DETECTED NOT DETECTED Final  Klebsiella pneumoniae NOT DETECTED NOT DETECTED Final   Proteus species NOT DETECTED NOT DETECTED Final   Serratia marcescens NOT DETECTED NOT DETECTED Final   Carbapenem resistance NOT DETECTED NOT DETECTED Final   Haemophilus influenzae NOT DETECTED NOT DETECTED Final   Neisseria meningitidis NOT DETECTED NOT DETECTED Final   Pseudomonas aeruginosa NOT DETECTED NOT DETECTED Final   Candida albicans NOT DETECTED NOT DETECTED Final   Candida glabrata NOT DETECTED NOT DETECTED Final   Candida krusei NOT DETECTED NOT DETECTED Final   Candida parapsilosis NOT DETECTED NOT DETECTED Final   Candida tropicalis NOT DETECTED NOT DETECTED Final    Comment: Performed at [facility], [place] [address], [place], [place] [address]  Culture, blood (routine x 2)     Status: Abnormal   Collection Time: 12/03/17  9:54 AM  Result Value Ref Range Status   Specimen Description   Final    LEFT ANTECUBITAL Performed at [facility] [address] [place], [place] [address]    Special Requests   Final    BOTTLES DRAWN AEROBIC AND ANAEROBIC Blood Culture adequate volume Performed at [facility] [address], [place], [place] [address]    Culture  Setup Time   Final    GRAM NEGATIVE RODS IN BOTH AEROBIC AND ANAEROBIC BOTTLES CRITICAL VALUE NOTED.  VALUE IS CONSISTENT WITH PREVIOUSLY REPORTED AND CALLED VALUE.    Culture (A)  Final    ESCHERICHIA  COLI SUSCEPTIBILITIES PERFORMED ON PREVIOUS CULTURE WITHIN THE LAST 5 DAYS. Performed at [facility], [place] [address], [place], [place] [address]    Report Status 12/08/2017 FINAL  Final  Culture, blood (routine x 2)     Status: Abnormal   Collection Time: 12/03/17 10:44 AM  Result Value Ref Range Status   Specimen Description   Final    BLOOD LEFT HAND Performed at [facility] [address], [place], [place] [address]    Special Requests   Final    BOTTLES DRAWN AEROBIC AND ANAEROBIC Blood Culture adequate volume Performed at [facility] [address] [place], [place] [address]    Culture  Setup Time   Final    GRAM NEGATIVE RODS IN BOTH AEROBIC AND ANAEROBIC BOTTLES Organism ID to follow CRITICAL RESULT CALLED TO, READ BACK BY AND VERIFIED WITH: E.WILLIAMSON,PHARMD AT 2145 ON 12/03/17 BY G.MCADOO Performed at [facility], [place] [address], [place], [place] [address]    Culture (A)  Final    ESCHERICHIA COLI Confirmed Extended Spectrum Beta-Lactamase Producer (ESBL).  In bloodstream infections from ESBL organisms, carbapenems are preferred over piperacillin/tazobactam. They are shown to have a lower risk of mortality.    Report Status 12/05/2017 FINAL  Final   Organism ID, Bacteria ESCHERICHIA COLI  Final      Susceptibility   Escherichia coli - MIC*    AMPICILLIN >=32 RESISTANT Resistant     CEFAZOLIN >=64 RESISTANT Resistant     CEFEPIME RESISTANT Resistant     CEFTAZIDIME >=64 RESISTANT Resistant     CEFTRIAXONE >=64 RESISTANT Resistant     CIPROFLOXACIN 1 SENSITIVE Sensitive     GENTAMICIN <=1 SENSITIVE Sensitive     IMIPENEM 1 SENSITIVE Sensitive     TRIMETH/SULFA <=20 SENSITIVE Sensitive     AMPICILLIN/SULBACTAM >=32 RESISTANT Resistant     PIP/TAZO >=128 RESISTANT Resistant     Extended ESBL POSITIVE Resistant     * ESCHERICHIA COLI  Blood  Culture ID Panel (Reflexed)     Status: Abnormal   Collection Time:  12/03/17 10:44 AM  Result Value Ref Range Status   Enterococcus species NOT DETECTED NOT DETECTED Final   Listeria monocytogenes NOT DETECTED NOT DETECTED Final   Staphylococcus species NOT DETECTED NOT DETECTED Final   Staphylococcus aureus (BCID) NOT DETECTED NOT DETECTED Final   Streptococcus species NOT DETECTED NOT DETECTED Final   Streptococcus agalactiae NOT DETECTED NOT DETECTED Final   Streptococcus pneumoniae NOT DETECTED NOT DETECTED Final   Streptococcus pyogenes NOT DETECTED NOT DETECTED Final   Acinetobacter baumannii NOT DETECTED NOT DETECTED Final   Enterobacteriaceae species DETECTED (A) NOT DETECTED Corrected    Comment: Enterobacteriaceae represent a large family of gram-negative bacteria, not a single organism. CRITICAL RESULT CALLED TO, READ BACK BY AND VERIFIED WITH: E.WILLIAMSON,PHARMD AT 2145 ON 12/03/17 BY G.MCADOO CORRECTED ON 11/27 AT 2249: PREVIOUSLY REPORTED AS DETECTED Enterobacteriaceae represent a large family of gram negative bacteria, not a single organism. CRITICAL RESULT CALLED TO, READ BACK BY AND VERIFIED WITH: H.BAIRD,PHARMD AT 2143 ON 12/03/17 BY  G.MCADOO    Enterobacter cloacae complex NOT DETECTED NOT DETECTED Final   Escherichia coli DETECTED (A) NOT DETECTED Corrected    Comment: CRITICAL RESULT CALLED TO, READ BACK BY AND VERIFIED WITH: E.WILLIAMSON,PHARMD AT 2145 ON 12/03/17 BY G.MCADOO CORRECTED ON 11/27 AT 2249: PREVIOUSLY REPORTED AS DETECTED CRITICAL RESULT CALLED TO, READ BACK BY AND VERIFIED WITH: H.BAIRD,PHARMD AT 2143 ON 12/03/17 BY G.MCADOO    Klebsiella oxytoca NOT DETECTED NOT DETECTED Final   Klebsiella pneumoniae NOT DETECTED NOT DETECTED Final   Proteus species NOT DETECTED NOT DETECTED Final   Serratia marcescens NOT DETECTED NOT DETECTED Final   Haemophilus influenzae NOT DETECTED NOT DETECTED Final   Neisseria meningitidis NOT DETECTED NOT DETECTED Final   Pseudomonas aeruginosa NOT DETECTED NOT DETECTED Final   Candida  albicans NOT DETECTED NOT DETECTED Final   Candida glabrata NOT DETECTED NOT DETECTED Final   Candida krusei NOT DETECTED NOT DETECTED Final   Candida parapsilosis NOT DETECTED NOT DETECTED Final   Candida tropicalis NOT DETECTED NOT DETECTED Final    Comment: Performed at [facility], [place] [address], [place], [place] [address]      Studies:  No results found.  Assessment: 62 y.o.  male with recently diagnosed metastatic cholangiocarcinoma to liver, lung and adrenal gland, untreated, but struct of jaundice, status post percutaneous biliary drainage tube placement, liver cirrhosis, multiple recent hospitalization for sepsis and dehydration, was admitted for sepsis from bacteremia  1. Sepsis from bacteremia of ESBL E. coli 2.  Septic shock, resolved 3.  Metastatic cholangiocarcinoma, untreated  4. Recurrent sepsis and bacteremia, source likely the biliary drain  5. Deconditioning  6. DNR/DNI 7.  Hypertension and diabetes   Plan:  -He has clinically improved quite a bit since he has been treated for bacteremia, he is on meropenem now -He is scheduled for biliary drainage tube removal and stent placement and port placement by Dr. Kathlene Cote tomorrow -I discussed with Dr. Emmit Alexanders today, he recommend meropenem for 5 to 7 days, and repeat culture to make sure it's negative. He does not see a problem for port placement tomorrow  -I will f/u as needed before discharge, will arrange his f/u and chemo with me after his discharge    [name], MD 12/08/2017  5:51 PM

## 2017-12-08 NOTE — Progress Notes (Signed)
These preliminary result these preliminary results were noted.  Awaiting final report.

## 2017-12-09 ENCOUNTER — Inpatient Hospital Stay (HOSPITAL_COMMUNITY): Payer: Medicare Other

## 2017-12-09 ENCOUNTER — Ambulatory Visit (HOSPITAL_COMMUNITY): Payer: Medicare Other

## 2017-12-09 ENCOUNTER — Encounter (HOSPITAL_COMMUNITY): Payer: Self-pay | Admitting: Interventional Radiology

## 2017-12-09 HISTORY — PX: IR IMAGING GUIDED PORT INSERTION: IMG5740

## 2017-12-09 HISTORY — PX: IR EXCHANGE BILIARY DRAIN: IMG6046

## 2017-12-09 LAB — COMPREHENSIVE METABOLIC PANEL
ALT: 31 U/L (ref 0–44)
AST: 31 U/L (ref 15–41)
Albumin: 2.3 g/dL — ABNORMAL LOW (ref 3.5–5.0)
Alkaline Phosphatase: 374 U/L — ABNORMAL HIGH (ref 38–126)
Anion gap: 7 (ref 5–15)
BUN: 13 mg/dL (ref 8–23)
CO2: 22 mmol/L (ref 22–32)
Calcium: 8.4 mg/dL — ABNORMAL LOW (ref 8.9–10.3)
Chloride: 106 mmol/L (ref 98–111)
Creatinine, Ser: 0.41 mg/dL — ABNORMAL LOW (ref 0.61–1.24)
GFR calc Af Amer: >60 mL/min (ref >60)
GFR calc non Af Amer: >60 mL/min (ref >60)
Glucose, Bld: 133 mg/dL — ABNORMAL HIGH (ref 70–99)
Potassium: 3.8 mmol/L (ref 3.5–5.1)
Sodium: 135 mmol/L (ref 135–145)
Total Bilirubin: 1.7 mg/dL — ABNORMAL HIGH (ref 0.3–1.2)
Total Protein: 6.8 g/dL (ref 6.5–8.1)

## 2017-12-09 LAB — GLUCOSE, CAPILLARY
Glucose-Capillary: 112 mg/dL — ABNORMAL HIGH (ref 70–99)
Glucose-Capillary: 102 mg/dL — ABNORMAL HIGH (ref 70–99)
Glucose-Capillary: 152 mg/dL — ABNORMAL HIGH (ref 70–99)
Glucose-Capillary: 161 mg/dL — ABNORMAL HIGH (ref 70–99)

## 2017-12-09 LAB — CBC WITH DIFFERENTIAL/PLATELET
Abs Immature Granulocytes: 0.04 10*3/uL (ref 0.00–0.07)
Basophils Absolute: 0 10*3/uL (ref 0.0–0.1)
Basophils Relative: 0 %
Eosinophils Absolute: 0.5 10*3/uL (ref 0.0–0.5)
Eosinophils Relative: 10 %
HCT: 29.1 % — ABNORMAL LOW (ref 39.0–52.0)
Hemoglobin: 9 g/dL — ABNORMAL LOW (ref 13.0–17.0)
IMMATURE GRANULOCYTES: 1 %
LYMPHS ABS: 1.1 10*3/uL (ref 0.7–4.0)
LYMPHS PCT: 20 %
MCH: 28.5 pg (ref 26.0–34.0)
MCHC: 30.9 g/dL (ref 30.0–36.0)
MCV: 92.1 fL (ref 80.0–100.0)
Monocytes Absolute: 0.6 10*3/uL (ref 0.1–1.0)
Monocytes Relative: 12 %
Neutro Abs: 3.2 10*3/uL (ref 1.7–7.7)
Neutrophils Relative %: 57 %
Platelets: 204 10*3/uL (ref 150–400)
RBC: 3.16 MIL/uL — ABNORMAL LOW (ref 4.22–5.81)
RDW: 16.4 % — ABNORMAL HIGH (ref 11.5–15.5)
WBC: 5.5 10*3/uL (ref 4.0–10.5)
nRBC: 0 % (ref 0.0–0.2)

## 2017-12-09 LAB — PROTIME-INR
INR: 1.29
PROTHROMBIN TIME: 15.9 s — AB (ref 11.4–15.2)

## 2017-12-09 MED ORDER — FENTANYL CITRATE (PF) 100 MCG/2ML IJ SOLN
INTRAMUSCULAR | Status: AC | PRN
  Administered 2017-12-09 (×4): 50 ug via INTRAVENOUS

## 2017-12-09 MED ORDER — LIDOCAINE HCL 1 % IJ SOLN
INTRAMUSCULAR | Status: AC | PRN
  Administered 2017-12-09: 5 mL

## 2017-12-09 MED ORDER — MIDAZOLAM HCL 2 MG/2ML IJ SOLN
INTRAMUSCULAR | Status: AC | PRN
  Administered 2017-12-09 (×2): 2 mg via INTRAVENOUS
  Administered 2017-12-09 (×2): 1 mg via INTRAVENOUS

## 2017-12-09 MED ORDER — CEFAZOLIN SODIUM-DEXTROSE 2-4 GM/100ML-% IV SOLN
INTRAVENOUS | Status: AC
  Filled 2017-12-09: qty 100

## 2017-12-09 MED ORDER — FENTANYL CITRATE (PF) 100 MCG/2ML IJ SOLN
INTRAMUSCULAR | Status: AC
  Filled 2017-12-09: qty 4

## 2017-12-09 MED ORDER — LIDOCAINE HCL 1 % IJ SOLN
INTRAMUSCULAR | Status: AC
  Filled 2017-12-09: qty 20

## 2017-12-09 MED ORDER — HEPARIN SOD (PORK) LOCK FLUSH 100 UNIT/ML IV SOLN
INTRAVENOUS | Status: AC
  Filled 2017-12-09: qty 5

## 2017-12-09 MED ORDER — LIDOCAINE HCL 1 % IJ SOLN
INTRAMUSCULAR | Status: AC | PRN
  Administered 2017-12-09: 10 mL

## 2017-12-09 MED ORDER — SODIUM CHLORIDE 0.9 % IV SOLN
1.0000 g | INTRAVENOUS | Status: DC
  Administered 2017-12-09 – 2017-12-10 (×2): 1000 mg via INTRAVENOUS
  Filled 2017-12-09 (×2): qty 1

## 2017-12-09 MED ORDER — HEPARIN SOD (PORK) LOCK FLUSH 100 UNIT/ML IV SOLN
INTRAVENOUS | Status: AC | PRN
  Administered 2017-12-09: 500 [IU] via INTRAVENOUS

## 2017-12-09 MED ORDER — IOPAMIDOL (ISOVUE-300) INJECTION 61%
INTRAVENOUS | Status: AC
  Filled 2017-12-09: qty 50

## 2017-12-09 MED ORDER — SODIUM CHLORIDE 0.9% FLUSH: 10.0000 mL | INTRAVENOUS | Status: DC | PRN

## 2017-12-09 MED ORDER — MIDAZOLAM HCL 2 MG/2ML IJ SOLN
INTRAMUSCULAR | Status: AC
  Filled 2017-12-09: qty 6

## 2017-12-09 MED ORDER — IOPAMIDOL (ISOVUE-300) INJECTION 61%
50.0000 mL | Freq: Once | INTRAVENOUS | Status: AC | PRN
  Administered 2017-12-09: 35 mL

## 2017-12-09 NOTE — Care Management Note (Signed)
Case Management Note  Patient Details  Name: Matthew Lewis MRN: [id number] Date of Birth: Apr 15, 1955  Subjective/Objective:                    Action/Plan:   Expected Discharge Date:  (unknown)               Expected Discharge Plan:     In-House Referral:     Discharge planning Services  CM Consult  Post Acute Care Choice:    Choice offered to:     DME Arranged:    DME Agency:     HH Arranged:    [place] Agency:     Status of Service:  In process, will continue to follow  If discussed at Long Length of Stay Meetings, dates discussed:    Additional Comments:  Kerin Salen 12/09/2017, 4:50 PM  Per Erin/ [facility] p#[phone]  Ertapenem IV 1 gram every 24hrs for 30day supply is $90.00 His system would not let him look for 9 day supply. No prior Josem Kaufmann is required

## 2017-12-09 NOTE — Progress Notes (Signed)
PHARMACY CONSULT NOTE FOR:  OUTPATIENT  PARENTERAL ANTIBIOTIC THERAPY (OPAT)  Indication: ESBL E. Coli Bacteremia  Regimen: ertapenem 1gm q24h End date: 12/18/2017  IV antibiotic discharge orders are pended. To discharging provider:  please sign these orders via discharge navigator,  Select New Orders & click on the button choice - Manage This Unsigned Work.     Thank you for allowing pharmacy to be a part of this patient's care.  Doreene Eland, PharmD, BCPS.   Work Cell: [phone] 12/09/2017 3:31 PM

## 2017-12-09 NOTE — Plan of Care (Signed)
  Problem: Clinical Measurements: Goal: Cardiovascular complication will be avoided Outcome: Progressing   

## 2017-12-09 NOTE — Sedation Documentation (Signed)
End of billary case

## 2017-12-09 NOTE — Progress Notes (Signed)
Nutrition Follow-up  DOCUMENTATION CODES:   Non-severe (moderate) malnutrition in context of chronic illness  INTERVENTION:  - Continue Boost Plus TID. - Diet re-advancement as medically feasible. - Continue to encourage PO intakes.    NUTRITION DIAGNOSIS:   Moderate Malnutrition related to chronic illness, catabolic illness, cancer and cancer related treatments as evidenced by mild fat depletion, moderate fat depletion, mild muscle depletion, moderate muscle depletion. -ongoing  GOAL:   Patient will meet greater than or equal to 90% of their needs -likely met on average.   MONITOR:   Diet advancement, PO intake, Supplement acceptance, Weight trends, Labs  ASSESSMENT:    62 y.o. male with complex medical history which includes intrahepatic cholangiocarcinoma with malignant neoplasm metastatic to the adrenal gland.  Patient had percutaneous biliary drainage tube placement on 10\20. [facility] the [facility] on [facility]27 patient was noted to be tachycardic and hypotensive. He was referred to the ED for sepsis.  Weight on admission was 86 kg/190 lb and is now up to 95.3 kg/210 lb. Current weight more consistent with weight trends in the previous months. Will continue to monitor weight trends closely. Patient has been NPO since midnight and went to IR for cholangiogram and exchange of biliary drain.   Diet was previously advanced from NPO to Regular on 11/27. Most recently, he ate 100% of all meals yesterday (total of 2625 kcal and 97 grams of protein). Per review of order, he has accepted 10 of 16 bottles of Boost Plus since ordered on 11/27.    Medications reviewed. Labs reviewed; CBG: 112 mg/dL today, creatinine: 0.41 mg/dL, Ca: 8.4 mg/dL, alk Phos elevated. IVF; D5-NS @ 10 mL/hr (41 kcal).     Diet Order:   Diet Order            Diet regular Room service appropriate? Yes; Fluid consistency: Thin  Diet effective now              EDUCATION NEEDS:   No education needs have been  identified at this time  Skin:  Skin Assessment: Reviewed RN Assessment  Last BM:  12/2  Height:   Ht Readings from Last 1 Encounters:  12/03/17 '5\' 11"'  (1.803 m)    Weight:   Wt Readings from Last 1 Encounters:  12/09/17 95.3 kg    Ideal Body Weight:  78.18 kg  BMI:  Body mass index is 29.29 kg/m.  Estimated Nutritional Needs:   Kcal:  2094-7096 kcal  Protein:  120-130 grams  Fluid:  >/= 2.2 L/day     Jarome Matin, MS, RD, LDN, [facility] Inpatient Clinical Dietitian Pager # [phone] After hours/weekend pager # [phone]

## 2017-12-09 NOTE — Sedation Documentation (Signed)
Starting port placement

## 2017-12-09 NOTE — Care Management Note (Signed)
Case Management Note  Patient Details  Name: Matthew Lewis MRN: [id number] Date of Birth: May 17, 1955  Subjective/Objective:  CM referral for benefit check for ertapenem iv 1 gm q24hrs x 9 days-E. Coli bacteremia-await outcome.                  Action/Plan:d/c home    Expected Discharge Date:  (unknown)               Expected Discharge Plan:     In-House Referral:     Discharge planning Services  CM Consult  Post Acute Care Choice:    Choice offered to:     DME Arranged:    DME Agency:     HH Arranged:    HH Agency:     Status of Service:  In process, will continue to follow  If discussed at Long Length of Stay Meetings, dates discussed:    Additional Comments:  Dessa Phi, RN 12/09/2017, 4:14 PM

## 2017-12-09 NOTE — Procedures (Signed)
Interventional Radiology Procedure Note  Procedure: Cholangiogram and exchange of biliary drain  Complications: None  Estimated Blood Loss: < 10 mL  Findings: Right sided bile ducts open with only mild intrahepatic narrowing and good flow. No visualization of left lobe ducts. No need for right sided or CBD stent. New 10 Fr biliary drain exchanged over wire and advanced to duodenum. Drain capped to trial internal drainage.  If bilirubin is too high for chemotherapy, will have to consider separate placement of a left lobe percutaneous biliary drain to decompress obstructed left lobe ducts.  Venetia Night. Kathlene Cote, M.D Pager:  [phone]

## 2017-12-09 NOTE — Progress Notes (Signed)
[facility] PROGRESS NOTE  Matthew Lewis UXN:[id number] DOB: 03-May-1955 DOA: 12/03/2017  PCP: [name], MD  Brief History/Interval Summary:  62 y.o. male with medical history significant for intrahepatic cholangiocarcinoma, metastatic to liver and lung, type 2 diabetes, prior history of alcohol abuse, cirrhosis, status post percutaneous biliary drainage drainage tube for biliary obstruction who presented on 12/03/2017 with several days of rigors, fevers, nausea and vomiting and generalized malaise.  On 11/26 patient was seen by IR for biliary drain flushing.  On the same day he was seen by oncology.  At that time he had complained of abdominal pain at the site of where the drain was flushed, he had also reported significant amount of pressure during that procedure.  He was afebrile at that evaluation but found to have a WBC of 14.2, complaints of rigors, complaints of nausea and emesis x1.  Blood cultures x2 and urine cultures were obtained and he received one-time dose of Rocephin 2 g IV.  He was also given IV Zofran and 1 L of normal saline for suspected dehydration. He was sent home with Keflex due to his prior history of Klebsiella infection. Patient was hospitalized for further management.  Patient was given Zosyn.  Cultures positive for ESBL E. coli.  Changed over to meropenem.  Reason for Visit: ESBL E. coli bacteremia.    Consultants: Medical oncology.  Interventional radiology.  Critical care medicine  Procedures: Central line placement in the right IJ  Antibiotics: Zosyn till 11/29 Meropenem starting 11/29  Subjective/Interval History: Patient seen after his procedure.  Denies any complaints.  ROS: Denies any headaches  Objective:  Vital Signs  Vitals:   12/09/17 1139 12/09/17 1211 12/09/17 1245 12/09/17 1339  BP: 100/71 105/74 110/70 107/72  Pulse: 88 86 88 92  Resp: 16 16 18 17   Temp: 98.2 F (36.8 C) 98.6 F (37 C) 98.4 F (36.9 C) 98.5 F  (36.9 C)  TempSrc: Oral Oral Oral Oral  SpO2: 97% 98% 98% 100%  Weight:      Height:        Intake/Output Summary (Last 24 hours) at 12/09/2017 1500 Last data filed at 12/09/2017 1340 Gross per 24 hour  Intake 1324.17 ml  Output 2625 ml  Net -1300.83 ml   Filed Weights   12/05/17 0500 12/07/17 0421 12/09/17 0542  Weight: 94.9 kg 96.8 kg 95.3 kg    General appearance: Awake alert.  In no distress Resp: Normal effort at rest.  Clear to auscultation bilaterally Cardio: S1-S2 is normal regular.  No S3-S4.  No rubs murmurs or bruit GI: Abdomen is soft.  Nontender nondistended.  No masses or organomegaly Neurologic: Alert and oriented x3.  No focal neurological deficits.  Lab Results:  Data Reviewed: I have personally reviewed following labs and imaging studies  CBC: Recent Labs  Lab 12/03/17 0953 12/04/17 0846 12/05/17 0727 12/07/17 0415 12/08/17 0342 12/09/17 0613  WBC 7.8 8.3 7.0 4.0 5.8 5.5  NEUTROABS 7.2  --   --   --   --  3.2  HGB 10.4* 8.3* 8.4* 8.0* 8.7* 9.0*  HCT 33.2* 26.2* 26.7* 26.0* 28.0* 29.1*  MCV 95.4 95.3 93.0 91.9 91.8 92.1  PLT 146* 125* 125* 123* 149* 254    Basic Metabolic Panel: Recent Labs  Lab 12/05/17 0727 12/06/17 0530 12/07/17 0415 12/08/17 0342 12/09/17 0613  NA 130* 134* 136 136 135  K 3.7 3.9 3.7 3.7 3.8  CL 105 107 109 109 106  CO2 18* 20* 22  21* 22  GLUCOSE 145* 130* 132* 137* 133*  BUN 12 11 12 12 13   CREATININE 0.52* 0.56* 0.66 0.46* 0.41*  CALCIUM 8.2* 8.1* 8.1* 8.3* 8.4*    GFR: Estimated Creatinine Clearance: 109.9 mL/min (A) (by C-G formula based on SCr of 0.41 mg/dL (L)).  Liver Function Tests: Recent Labs  Lab 12/05/17 0727 12/06/17 0530 12/07/17 0415 12/08/17 0342 12/09/17 0613  AST 43* 30 28 29 31   ALT 37 31 28 30 31   ALKPHOS 201* 203* 225* 310* 374*  BILITOT 2.6* 2.2* 2.0* 1.8* 1.7*  PROT 6.7 6.4* 6.3* 6.6 6.8  ALBUMIN 2.5* 2.2* 2.2* 2.3* 2.3*    Coagulation Profile: Recent Labs  Lab  12/03/17 0953 12/09/17 0613  INR 1.51 1.29     CBG: Recent Labs  Lab 12/08/17 1203 12/08/17 1714 12/08/17 2231 12/09/17 0751 12/09/17 1137  GLUCAP 158* 122* 151* 112* 102*     Recent Results (from the past 240 hour(s))  Culture, Blood     Status: None   Collection Time: 12/02/17 12:51 PM  Result Value Ref Range Status   Specimen Description BLOOD LEFT HAND  Final   Special Requests   Final    BOTTLES DRAWN AEROBIC AND ANAEROBIC Blood Culture adequate volume   Culture   Final    NO GROWTH 5 DAYS Performed at [facility], [place] [address] [place], [place] [address]    Report Status 12/07/2017 FINAL  Final  Culture, Blood     Status: Abnormal   Collection Time: 12/02/17 12:51 PM  Result Value Ref Range Status   Specimen Description BLOOD RIGHT HAND  Final   Special Requests   Final    BOTTLES DRAWN AEROBIC AND ANAEROBIC Blood Culture adequate volume   Culture  Setup Time   Final    GRAM NEGATIVE RODS ANAEROBIC BOTTLE ONLY Organism ID to follow    Culture (A)  Final    ESCHERICHIA COLI SUSCEPTIBILITIES PERFORMED ON PREVIOUS CULTURE WITHIN THE LAST 5 DAYS. Performed at [facility], [place] [address] [place], [place] [address]    Report Status 12/05/2017 FINAL  Final  Blood Culture ID Panel (Reflexed)     Status: Abnormal   Collection Time: 12/02/17 12:51 PM  Result Value Ref Range Status   Enterococcus species NOT DETECTED NOT DETECTED Final   Listeria monocytogenes NOT DETECTED NOT DETECTED Final   Staphylococcus species NOT DETECTED NOT DETECTED Final   Staphylococcus aureus (BCID) NOT DETECTED NOT DETECTED Final   Streptococcus species NOT DETECTED NOT DETECTED Final   Streptococcus agalactiae NOT DETECTED NOT DETECTED Final   Streptococcus pneumoniae NOT DETECTED NOT DETECTED Final   Streptococcus pyogenes NOT DETECTED NOT DETECTED Final   Acinetobacter baumannii NOT DETECTED NOT DETECTED Final   Enterobacteriaceae species DETECTED (A) NOT  DETECTED Final    Comment: Enterobacteriaceae represent a large family of gram-negative bacteria, not a single organism. CRITICAL RESULT CALLED TO, READ BACK BY AND VERIFIED WITH: E.WILLIAMSON,PHARMD AT 2145 ON 12/03/17 BY G.MCADOO    Enterobacter cloacae complex NOT DETECTED NOT DETECTED Final   Escherichia coli DETECTED (A) NOT DETECTED Final    Comment: CRITICAL RESULT CALLED TO, READ BACK BY AND VERIFIED WITH: E.WILLIAMSON,PHARMD AT 2145 ON 12/03/17 BY G.MCADOO    Klebsiella oxytoca NOT DETECTED NOT DETECTED Final   Klebsiella pneumoniae NOT DETECTED NOT DETECTED Final   Proteus species NOT DETECTED NOT DETECTED Final   Serratia marcescens NOT DETECTED NOT DETECTED Final   Carbapenem resistance NOT DETECTED  NOT DETECTED Final   Haemophilus influenzae NOT DETECTED NOT DETECTED Final   Neisseria meningitidis NOT DETECTED NOT DETECTED Final   Pseudomonas aeruginosa NOT DETECTED NOT DETECTED Final   Candida albicans NOT DETECTED NOT DETECTED Final   Candida glabrata NOT DETECTED NOT DETECTED Final   Candida krusei NOT DETECTED NOT DETECTED Final   Candida parapsilosis NOT DETECTED NOT DETECTED Final   Candida tropicalis NOT DETECTED NOT DETECTED Final    Comment: Performed at [facility], [place] [address] [place], [place] [address]  Culture, blood (routine x 2)     Status: Abnormal   Collection Time: 12/03/17  9:54 AM  Result Value Ref Range Status   Specimen Description   Final    LEFT ANTECUBITAL Performed at [place] [address], [place], [place] [address]    Special Requests   Final    BOTTLES DRAWN AEROBIC AND ANAEROBIC Blood Culture adequate volume Performed at [place] [address], [place], [place] [address]    Culture  Setup Time   Final    GRAM NEGATIVE RODS IN BOTH AEROBIC AND ANAEROBIC BOTTLES CRITICAL VALUE NOTED.  VALUE IS CONSISTENT WITH PREVIOUSLY REPORTED AND CALLED VALUE.    Culture (A)  Final     ESCHERICHIA COLI SUSCEPTIBILITIES PERFORMED ON PREVIOUS CULTURE WITHIN THE LAST 5 DAYS. Performed at [facility], [place] [address], [place], [place] [address]    Report Status 12/08/2017 FINAL  Final  Culture, blood (routine x 2)     Status: Abnormal   Collection Time: 12/03/17 10:44 AM  Result Value Ref Range Status   Specimen Description   Final    BLOOD LEFT HAND Performed at [place] [facility] [address] [place], [place] [address]    Special Requests   Final    BOTTLES DRAWN AEROBIC AND ANAEROBIC Blood Culture adequate volume Performed at [place] [facility] [address] [place], [place] [address]    Culture  Setup Time   Final    GRAM NEGATIVE RODS IN BOTH AEROBIC AND ANAEROBIC BOTTLES Organism ID to follow CRITICAL RESULT CALLED TO, READ BACK BY AND VERIFIED WITH: E.WILLIAMSON,PHARMD AT 2145 ON 12/03/17 BY G.MCADOO Performed at [facility], [place] [address] [place],  [address]    Culture (A)  Final    ESCHERICHIA COLI Confirmed Extended Spectrum Beta-Lactamase Producer (ESBL).  In bloodstream infections from ESBL organisms, carbapenems are preferred over piperacillin/tazobactam. They are shown to have a lower risk of mortality.    Report Status 12/05/2017 FINAL  Final   Organism ID, Bacteria ESCHERICHIA COLI  Final      Susceptibility   Escherichia coli - MIC*    AMPICILLIN >=32 RESISTANT Resistant     CEFAZOLIN >=64 RESISTANT Resistant     CEFEPIME RESISTANT Resistant     CEFTAZIDIME >=64 RESISTANT Resistant     CEFTRIAXONE >=64 RESISTANT Resistant     CIPROFLOXACIN 1 SENSITIVE Sensitive     GENTAMICIN <=1 SENSITIVE Sensitive     IMIPENEM 1 SENSITIVE Sensitive     TRIMETH/SULFA <=20 SENSITIVE Sensitive     AMPICILLIN/SULBACTAM >=32 RESISTANT Resistant     PIP/TAZO >=128 RESISTANT Resistant     Extended ESBL POSITIVE Resistant     * ESCHERICHIA COLI  Blood Culture ID Panel (Reflexed)     Status: Abnormal    Collection Time: 12/03/17 10:44 AM  Result Value Ref Range Status   Enterococcus species NOT DETECTED NOT  DETECTED Final   Listeria monocytogenes NOT DETECTED NOT DETECTED Final   Staphylococcus species NOT DETECTED NOT DETECTED Final   Staphylococcus aureus (BCID) NOT DETECTED NOT DETECTED Final   Streptococcus species NOT DETECTED NOT DETECTED Final   Streptococcus agalactiae NOT DETECTED NOT DETECTED Final   Streptococcus pneumoniae NOT DETECTED NOT DETECTED Final   Streptococcus pyogenes NOT DETECTED NOT DETECTED Final   Acinetobacter baumannii NOT DETECTED NOT DETECTED Final   Enterobacteriaceae species DETECTED (A) NOT DETECTED Corrected    Comment: Enterobacteriaceae represent a large family of gram-negative bacteria, not a single organism. CRITICAL RESULT CALLED TO, READ BACK BY AND VERIFIED WITH: E.WILLIAMSON,PHARMD AT 2145 ON 12/03/17 BY G.MCADOO CORRECTED ON 11/27 AT 2249: PREVIOUSLY REPORTED AS DETECTED Enterobacteriaceae represent a large family of gram negative bacteria, not a single organism. CRITICAL RESULT CALLED TO, READ BACK BY AND VERIFIED WITH: H.BAIRD,PHARMD AT 2143 ON 12/03/17 BY  G.MCADOO    Enterobacter cloacae complex NOT DETECTED NOT DETECTED Final   Escherichia coli DETECTED (A) NOT DETECTED Corrected    Comment: CRITICAL RESULT CALLED TO, READ BACK BY AND VERIFIED WITH: E.WILLIAMSON,PHARMD AT 2145 ON 12/03/17 BY G.MCADOO CORRECTED ON 11/27 AT 2249: PREVIOUSLY REPORTED AS DETECTED CRITICAL RESULT CALLED TO, READ BACK BY AND VERIFIED WITH: H.BAIRD,PHARMD AT 2143 ON 12/03/17 BY G.MCADOO    Klebsiella oxytoca NOT DETECTED NOT DETECTED Final   Klebsiella pneumoniae NOT DETECTED NOT DETECTED Final   Proteus species NOT DETECTED NOT DETECTED Final   Serratia marcescens NOT DETECTED NOT DETECTED Final   Haemophilus influenzae NOT DETECTED NOT DETECTED Final   Neisseria meningitidis NOT DETECTED NOT DETECTED Final   Pseudomonas aeruginosa NOT DETECTED NOT DETECTED  Final   Candida albicans NOT DETECTED NOT DETECTED Final   Candida glabrata NOT DETECTED NOT DETECTED Final   Candida krusei NOT DETECTED NOT DETECTED Final   Candida parapsilosis NOT DETECTED NOT DETECTED Final   Candida tropicalis NOT DETECTED NOT DETECTED Final    Comment: Performed at [facility], [place] [address], [place], [place] [address]      Radiology Studies: Ir Exchange Biliary Drain  Result Date: 12/09/2017 INDICATION: Cholangiocarcinoma of liver, primarily within the left lobe causing biliary obstruction. Status post biliary drain placement from a right lobe approach on 10/26/2017. Admitted with sepsis and now adequately treated for bacteremia. Assessment of bile ducts performed to determine need for internal biliary stenting and whether the external drain can be removed. EXAM: EXCHANGE OF BILIARY DRAINAGE CATHETER WITH CHOLANGIOGRAM MEDICATIONS: 1 g IV meropenem; The antibiotic was administered within an appropriate time frame prior to the initiation of the procedure. ANESTHESIA/SEDATION: Moderate (conscious) sedation was employed during this procedure. A total of Versed 3.0 mg and Fentanyl 100 mcg was administered intravenously. Moderate Sedation Time: 23 minutes. The patient's level of consciousness and vital signs were monitored continuously by radiology nursing throughout the procedure under my direct supervision. FLUOROSCOPY TIME:  Fluoroscopy Time: 3 minutes and 18 seconds. 84.8 mGy. COMPLICATIONS: None immediate. PROCEDURE: Informed written consent was obtained from the patient after a thorough discussion of the procedural risks, benefits and alternatives. All questions were addressed. Maximal Sterile Barrier Technique was utilized including caps, mask, sterile gowns, sterile gloves, sterile drape, hand hygiene and skin antiseptic. A timeout was performed prior to the initiation of the procedure. Initially, the indwelling 10 [place] biliary drainage catheter was removed over a  guidewire. A 7 French sheath was advanced into the right lobe of the liver and a cholangiogram performed with visualization of  biliary drainage in the right lobe as well as extrahepatic bile ducts. The sheath positioning was altered and a 5 [place] catheter also advanced parallel to the guidewire in attempting opacification of left lobe bile ducts. A new 10 French internal/external biliary drainage catheter was then advanced over a guidewire. The distal portion was formed in the duodenum. Catheter positioning was confirmed by fluoroscopy after contrast injection. The drainage catheter was flushed with saline and capped. It was secured at the skin with a Prolene retention suture. FINDINGS: Initial cholangiogram shows fairly good drainage throughout the right lobe bile ducts and extrahepatic bile ducts without evidence of significant obstruction. There is some mild narrowing of central right intrahepatic ducts near the confluence with left lobe ducts. The common bile duct is normally patent. The cystic duct is patent with reflux of contrast noted into the gallbladder. Contrast enters the duodenum normally. Left lobe ducts appear isolated and likely obstructed centrally with no ability to opacified left lobe bile ducts by right-sided cholangiogram. Injection of a 5 French catheter introduced into central bile ducts could not opacify left lobe ducts. It was evident that there is currently not a need for right-sided permanent indwelling biliary stenting. In addition, placement of a central right-sided biliary stent would completely isolate any existing drainage of left-sided bile ducts. For this reason, the biliary drain was exchanged and will be left capped for a trial of internal drainage. Should bilirubin levels rise, there may be a need for separate percutaneous drainage of left-sided bile ducts at a different setting. IMPRESSION: 1. Cholangiogram shows good drainage throughout the right lobe bile ducts and  extrahepatic bile ducts without evidence of significant obstruction. There is some degree of narrowing of central right intrahepatic bile ducts near the biliary confluence with left lobe ducts. However, there was not enough obstruction present to warrant internal stenting and stenting would also isolate left lobe bile ducts. The current right-sided internal/external biliary drainage catheter was exchanged for a new 10 French catheter. This will be initially capped for a trial of internal drainage. 2. Should bilirubin levels rise, there may be a need for an additional separate percutaneous drainage procedure of left lobe bile ducts. Electronically Signed   By: Aletta Matthew M.D.   On: 12/09/2017 13:39   Ir Imaging Guided Port Insertion  Result Date: 12/09/2017 CLINICAL DATA:  Cholangiocarcinoma and need for porta cath for chemotherapy. EXAM: IMPLANTED PORT A CATH PLACEMENT WITH ULTRASOUND AND FLUOROSCOPIC GUIDANCE ANESTHESIA/SEDATION: 3.0 mg IV Versed; 100 mcg IV Fentanyl Total Moderate Sedation Time:  37 minutes The patient's level of consciousness and physiologic status were continuously monitored during the procedure by Radiology nursing. Additional Medications: A scheduled dose 1 g IV meropenem with given just prior to the procedure. FLUOROSCOPY TIME:  42 seconds.  16.7 mGy. PROCEDURE: The procedure, risks, benefits, and alternatives were explained to the patient. Questions regarding the procedure were encouraged and answered. The patient understands and consents to the procedure. A time-out was performed prior to initiating the procedure. Ultrasound was utilized to confirm patency of the right internal jugular vein. The right neck and chest were prepped with chlorhexidine in a sterile fashion, and a sterile drape was applied covering the operative field. Maximum barrier sterile technique with sterile gowns and gloves were used for the procedure. Local anesthesia was provided with 1% lidocaine. After  creating a small venotomy incision, a 21 gauge needle was advanced into the right internal jugular vein under direct, real-time ultrasound guidance. Ultrasound image documentation was performed.  After securing guidewire access, an 8 Fr dilator was placed. A J-wire was kinked to measure appropriate catheter length. A subcutaneous port pocket was then created along the upper chest wall utilizing sharp and blunt dissection. Portable cautery was utilized. The pocket was irrigated with sterile saline. A single lumen power injectable port was chosen for placement. The 8 Fr catheter was tunneled from the port pocket site to the venotomy incision. The port was placed in the pocket. External catheter was trimmed to appropriate length based on guidewire measurement. At the venotomy, an 8 Fr peel-away sheath was placed over a guidewire. The catheter was then placed through the sheath and the sheath removed. Final catheter positioning was confirmed and documented with a fluoroscopic spot image. The port was accessed with a needle and aspirated and flushed with heparinized saline. The access needle was removed. The venotomy and port pocket incisions were closed with subcutaneous 3-0 Monocryl and subcuticular 4-0 Vicryl. Dermabond was applied to both incisions. COMPLICATIONS: COMPLICATIONS None FINDINGS: After catheter placement, the tip lies at the cavo-atrial junction. The catheter aspirates normally and is ready for immediate use. IMPRESSION: Placement of single lumen port a cath via right internal jugular vein. The catheter tip lies at the cavo-atrial junction. A power injectable port a cath was placed and is ready for immediate use. Electronically Signed   By: Aletta Matthew M.D.   On: 12/09/2017 14:37     Medications:  Scheduled: . celecoxib  200 mg Oral QPM  . enoxaparin (LOVENOX) injection  40 mg Subcutaneous Q24H  . fentaNYL      . gabapentin  400 mg Oral Daily  . gabapentin  800 mg Oral QPM  . heparin lock  flush      . iopamidol      . lactose free nutrition  237 mL Oral TID WC  . lidocaine      . lidocaine      . loratadine  10 mg Oral Daily  . midazolam      . sodium chloride flush  10-40 mL Intracatheter Q12H  . sodium chloride flush  10-40 mL Intracatheter Q12H   Continuous: . meropenem (MERREM) IV 1 g (12/09/17 0349)  . sodium chloride     ZPH:[id number] **OR** acetaminophen, ondansetron **OR** ondansetron (ZOFRAN) IV, oxyCODONE, polyvinyl alcohol, senna-docusate, sodium chloride flush, sodium chloride flush, zolpidem    Assessment/Plan:  Septic shock secondary to ESBL E. coli bacteremia with lactic acidosis Most likely biliary source considering his previous history and presence of biliary drain.  CT scan of the abdomen showed that the drain was in good position.  Stable ductal dilatation and masses noted compared to previous imaging studies.  Patient was seen by critical care medicine.  Central line was placed.  Patient was placed on pressors.  Blood pressures improved.  Patient was weaned off of Levophed.  He was transferred to the floor.  Blood pressure has been stable.  Cortisol level 14.8.  Zosyn was changed over to meropenem as he was found to have E. coli ESBL bacteremia.  Lactic acid levels improved.  Patient seen by interventional radiology.  Since his bile ducts are open with only mild intrahepatic narrowing and good flow there was no need for any stent placement.  His biliary drain was internalized.  A Port-A-Cath was also placed.  Bilirubin has improved over the last several days.  Discussed with Dr. Baxter Flattery regarding IV antibiotics and she recommends a total of 14 days of treatment.  Today he will complete  5 days of meropenem.  He can be transitioned to ertapenem at discharge.  Case management to look into this further.  Antibiotics could be administered through the Port-A-Cath that was placed today.  His central line will need to be discontinued prior to discharge.  History  of cholangiocarcinoma with metastases with presence of biliary drain Patient has had recurrent issues with his drain with bacteremia.  Medical oncology and interventional radiology have seen the patient.  Bilirubin level has improved.  Alkaline phosphatase remains elevated.  Drain was internalized this morning by IR.  Mild hyponatremia, due to hypovolemia Improved  Normocytic anemia Baseline hemoglobin around 9-10.  Drop in hemoglobin likely dilutional.  Hemoglobin low but stable.  Mild acute kidney injury Renal function is back to baseline.  Monitor urine output.  Diabetes mellitus type 2 with hyperglycemia/peripheral neuropathy HbA1c 4.9 on November 3.  Patient has had a few hypoglycemic episodes in the hospital which could be due to sepsis.  He is on a regular diet.  CBGs are normal now.  Patient is on oral hypoglycemics at home which has been held.  Considering his low HbA1c may need to stop this altogether.  Continue gabapentin.  Patient also on Celebrex which he requested be initiated.  Essential hypertension Blood pressures have improved.  Continue to hold lisinopril.  DVT Prophylaxis: Lovenox    Code Status: DNR Family Communication: Discussed with the patient Disposition Plan: Management as outlined above.  He will need 14 days of treatment for ESBL E. coli.  He will complete 5 days at the end of the day today.  Case management to look into using ertapenem at home.  And whether this can be given through the Port-A-Cath that was placed today.    LOS: 6 days   [facility] Pager [phone] 12/09/2017, 3:00 PM  If 7PM-7AM, please contact night-coverage at [email or web address], password [email or web address]

## 2017-12-09 NOTE — Procedures (Signed)
Interventional Radiology Procedure Note  Procedure: Single Lumen Power Port Placement    Access:  Right IJ vein.  Findings: Catheter tip positioned at SVC/RA junction. Port is ready for immediate use.   Complications: None  EBL: < 10 mL  Recommendations:  - Ok to shower in 24 hours - Do not submerge for 7 days - Routine line care   Claire Dolores T. Kathlene Cote, M.D Pager:  [phone]

## 2017-12-10 ENCOUNTER — Other Ambulatory Visit: Payer: Medicare Other

## 2017-12-10 DIAGNOSIS — R6521 Severe sepsis with septic shock: Secondary | ICD-10-CM

## 2017-12-10 DIAGNOSIS — A419 Sepsis, unspecified organism: Secondary | ICD-10-CM

## 2017-12-10 LAB — GLUCOSE, CAPILLARY
Glucose-Capillary: 104 mg/dL — ABNORMAL HIGH (ref 70–99)
Glucose-Capillary: 148 mg/dL — ABNORMAL HIGH (ref 70–99)
Glucose-Capillary: 135 mg/dL — ABNORMAL HIGH (ref 70–99)
Glucose-Capillary: 181 mg/dL — ABNORMAL HIGH (ref 70–99)

## 2017-12-10 LAB — COMPREHENSIVE METABOLIC PANEL
ALT: 46 U/L — ABNORMAL HIGH (ref 0–44)
AST: 59 U/L — ABNORMAL HIGH (ref 15–41)
Albumin: 2.3 g/dL — ABNORMAL LOW (ref 3.5–5.0)
Alkaline Phosphatase: 548 U/L — ABNORMAL HIGH (ref 38–126)
Anion gap: 6 (ref 5–15)
BILIRUBIN TOTAL: 2 mg/dL — AB (ref 0.3–1.2)
BUN: 12 mg/dL (ref 8–23)
CO2: 26 mmol/L (ref 22–32)
Calcium: 8.5 mg/dL — ABNORMAL LOW (ref 8.9–10.3)
Chloride: 104 mmol/L (ref 98–111)
Creatinine, Ser: 0.48 mg/dL — ABNORMAL LOW (ref 0.61–1.24)
GFR calc Af Amer: >60 mL/min (ref >60)
GFR calc non Af Amer: >60 mL/min (ref >60)
Glucose, Bld: 127 mg/dL — ABNORMAL HIGH (ref 70–99)
Potassium: 4 mmol/L (ref 3.5–5.1)
Sodium: 136 mmol/L (ref 135–145)
TOTAL PROTEIN: 6.9 g/dL (ref 6.5–8.1)

## 2017-12-10 LAB — CBC
HCT: 29.4 % — ABNORMAL LOW (ref 39.0–52.0)
Hemoglobin: 9.2 g/dL — ABNORMAL LOW (ref 13.0–17.0)
MCH: 29.3 pg (ref 26.0–34.0)
MCHC: 31.3 g/dL (ref 30.0–36.0)
MCV: 93.6 fL (ref 80.0–100.0)
Platelets: 222 10*3/uL (ref 150–400)
RBC: 3.14 MIL/uL — ABNORMAL LOW (ref 4.22–5.81)
RDW: 16.7 % — AB (ref 11.5–15.5)
WBC: 6 10*3/uL (ref 4.0–10.5)
nRBC: 0 % (ref 0.0–0.2)

## 2017-12-10 NOTE — Progress Notes (Signed)
[name]   DOB:1955/04/22   QP#:[id number]   ZTI#:[id number]  Oncology follow up   Subjective: Continues to improve.  He underwent percutaneous biliary drain tube exchange yesterday.  He overall feels better, walked in the hallway and stairs with physical therapist.  He is eating better today, afebrile, vital signs stable.  Objective:  Vitals:   12/10/17 0443 12/10/17 1247  BP: 107/71 96/68  Pulse: 96 98  Resp: 16   Temp: 98.5 F (36.9 C) 98.3 F (36.8 C)  SpO2: 97% 97%    Body mass index is 29.29 kg/m.  Intake/Output Summary (Last 24 hours) at 12/10/2017 1957 Last data filed at 12/10/2017 1800 Gross per 24 hour  Intake 1540 ml  Output 800 ml  Net 740 ml     Sclerae unicteric  Oropharynx clear  No peripheral adenopathy  Lungs clear -- no rales or rhonchi  Heart regular rate and rhythm  Abdomen soft, biliary drainage tube in the right upper quadrant, capped.  MSK no focal spinal tenderness, no peripheral edema  Neuro nonfocal     CBG (last 3)  Recent Labs    12/10/17 0736 12/10/17 1132 12/10/17 1731  GLUCAP 104* 148* 135*     Labs:  Lab Results  Component Value Date   WBC 6.0 12/10/2017   HGB 9.2 (L) 12/10/2017   HCT 29.4 (L) 12/10/2017   MCV 93.6 12/10/2017   PLT 222 12/10/2017   NEUTROABS 3.2 12/09/2017   CMP Latest Ref Rng & Units 12/10/2017 12/09/2017 12/08/2017  Glucose 70 - 99 mg/dL 127(H) 133(H) 137(H)  BUN 8 - 23 mg/dL 12 13 12   Creatinine 0.61 - 1.24 mg/dL 0.48(L) 0.41(L) 0.46(L)  Sodium 135 - 145 mmol/L 136 135 136  Potassium 3.5 - 5.1 mmol/L 4.0 3.8 3.7  Chloride 98 - 111 mmol/L 104 106 109  CO2 22 - 32 mmol/L 26 22 21(L)  Calcium 8.9 - 10.3 mg/dL 8.5(L) 8.4(L) 8.3(L)  Total Protein 6.5 - 8.1 g/dL 6.9 6.8 6.6  Total Bilirubin 0.3 - 1.2 mg/dL 2.0(H) 1.7(H) 1.8(H)  Alkaline Phos 38 - 126 U/L 548(H) 374(H) 310(H)  AST 15 - 41 U/L 59(H) 31 29  ALT 0 - 44 U/L 46(H) 31 30     Urine Studies No results for input(s): UHGB, CRYS in the last  72 hours.  Invalid input(s): UACOL, UAPR, USPG, UPH, UTP, UGL, UKET, UBIL, UNIT, UROB, [place], UEPI, UWBC, [name], [place]  Basic Metabolic Panel: Recent Labs  Lab 12/06/17 0530 12/07/17 0415 12/08/17 0342 12/09/17 0613 12/10/17 0438  NA 134* 136 136 135 136  K 3.9 3.7 3.7 3.8 4.0  CL 107 109 109 106 104  CO2 20* 22 21* 22 26  GLUCOSE 130* 132* 137* 133* 127*  BUN 11 12 12 13 12   CREATININE 0.56* 0.66 0.46* 0.41* 0.48*  CALCIUM 8.1* 8.1* 8.3* 8.4* 8.5*   GFR Estimated Creatinine Clearance: 109.9 mL/min (A) (by C-G formula based on SCr of 0.48 mg/dL (L)). Liver Function Tests: Recent Labs  Lab 12/06/17 0530 12/07/17 0415 12/08/17 0342 12/09/17 0613 12/10/17 0438  AST 30 28 29 31  59*  ALT 31 28 30 31  46*  ALKPHOS 203* 225* 310* 374* 548*  BILITOT 2.2* 2.0* 1.8* 1.7* 2.0*  PROT 6.4* 6.3* 6.6 6.8 6.9  ALBUMIN 2.2* 2.2* 2.3* 2.3* 2.3*   No results for input(s): LIPASE, AMYLASE in the last 168 hours. No results for input(s): AMMONIA in the last 168 hours. Coagulation profile Recent Labs  Lab 12/09/17 0613  INR 1.29    CBC: Recent Labs  Lab 12/05/17 0727 12/07/17 0415 12/08/17 0342 12/09/17 0613 12/10/17 0438  WBC 7.0 4.0 5.8 5.5 6.0  NEUTROABS  --   --   --  3.2  --   HGB 8.4* 8.0* 8.7* 9.0* 9.2*  HCT 26.7* 26.0* 28.0* 29.1* 29.4*  MCV 93.0 91.9 91.8 92.1 93.6  PLT 125* 123* 149* 204 222   Cardiac Enzymes: No results for input(s): CKTOTAL, CKMB, CKMBINDEX, TROPONINI in the last 168 hours. BNP: Invalid input(s): POCBNP CBG: Recent Labs  Lab 12/09/17 1637 12/09/17 2221 12/10/17 0736 12/10/17 1132 12/10/17 1731  GLUCAP 152* 161* 104* 148* 135*   D-Dimer No results for input(s): DDIMER in the last 72 hours. Hgb A1c No results for input(s): HGBA1C in the last 72 hours. Lipid Profile No results for input(s): CHOL, HDL, LDLCALC, TRIG, CHOLHDL, LDLDIRECT in the last 72 hours. Thyroid function studies No results for input(s): TSH,  T4TOTAL, T3FREE, THYROIDAB in the last 72 hours.  Invalid input(s): FREET3 Anemia work up No results for input(s): VITAMINB12, FOLATE, FERRITIN, TIBC, IRON, RETICCTPCT in the last 72 hours. Microbiology Recent Results (from the past 240 hour(s))  Culture, Blood     Status: None   Collection Time: 12/02/17 12:51 PM  Result Value Ref Range Status   Specimen Description BLOOD LEFT HAND  Final   Special Requests   Final    BOTTLES DRAWN AEROBIC AND ANAEROBIC Blood Culture adequate volume   Culture   Final    NO GROWTH 5 DAYS Performed at [facility], [address], [place], [place] [address]    Report Status 12/07/2017 FINAL  Final  Culture, Blood     Status: Abnormal   Collection Time: 12/02/17 12:51 PM  Result Value Ref Range Status   Specimen Description BLOOD RIGHT HAND  Final   Special Requests   Final    BOTTLES DRAWN AEROBIC AND ANAEROBIC Blood Culture adequate volume   Culture  Setup Time   Final    GRAM NEGATIVE RODS ANAEROBIC BOTTLE ONLY Organism ID to follow    Culture (A)  Final    ESCHERICHIA COLI SUSCEPTIBILITIES PERFORMED ON PREVIOUS CULTURE WITHIN THE LAST 5 DAYS. Performed at [facility], [place] [address] [place], [place] [address]    Report Status 12/05/2017 FINAL  Final  Blood Culture ID Panel (Reflexed)     Status: Abnormal   Collection Time: 12/02/17 12:51 PM  Result Value Ref Range Status   Enterococcus species NOT DETECTED NOT DETECTED Final   Listeria monocytogenes NOT DETECTED NOT DETECTED Final   Staphylococcus species NOT DETECTED NOT DETECTED Final   Staphylococcus aureus (BCID) NOT DETECTED NOT DETECTED Final   Streptococcus species NOT DETECTED NOT DETECTED Final   Streptococcus agalactiae NOT DETECTED NOT DETECTED Final   Streptococcus pneumoniae NOT DETECTED NOT DETECTED Final   Streptococcus pyogenes NOT DETECTED NOT DETECTED Final   Acinetobacter baumannii NOT DETECTED NOT DETECTED Final   Enterobacteriaceae species DETECTED  (A) NOT DETECTED Final    Comment: Enterobacteriaceae represent a large family of gram-negative bacteria, not a single organism. CRITICAL RESULT CALLED TO, READ BACK BY AND VERIFIED WITH: E.WILLIAMSON,PHARMD AT 2145 ON 12/03/17 BY G.MCADOO    Enterobacter cloacae complex NOT DETECTED NOT DETECTED Final   Escherichia coli DETECTED (A) NOT DETECTED Final    Comment: CRITICAL RESULT CALLED TO, READ BACK BY AND VERIFIED WITH: E.WILLIAMSON,PHARMD AT 2145 ON 12/03/17 BY G.MCADOO  Klebsiella oxytoca NOT DETECTED NOT DETECTED Final   Klebsiella pneumoniae NOT DETECTED NOT DETECTED Final   Proteus species NOT DETECTED NOT DETECTED Final   Serratia marcescens NOT DETECTED NOT DETECTED Final   Carbapenem resistance NOT DETECTED NOT DETECTED Final   Haemophilus influenzae NOT DETECTED NOT DETECTED Final   Neisseria meningitidis NOT DETECTED NOT DETECTED Final   Pseudomonas aeruginosa NOT DETECTED NOT DETECTED Final   Candida albicans NOT DETECTED NOT DETECTED Final   Candida glabrata NOT DETECTED NOT DETECTED Final   Candida krusei NOT DETECTED NOT DETECTED Final   Candida parapsilosis NOT DETECTED NOT DETECTED Final   Candida tropicalis NOT DETECTED NOT DETECTED Final    Comment: Performed at [facility], [place] [address] [place], [place] [address]  Culture, blood (routine x 2)     Status: Abnormal   Collection Time: 12/03/17  9:54 AM  Result Value Ref Range Status   Specimen Description   Final    LEFT ANTECUBITAL Performed at [facility] [address], [place], [place] [address]    Special Requests   Final    BOTTLES DRAWN AEROBIC AND ANAEROBIC Blood Culture adequate volume Performed at [facility] [address], [place], [place] [address]    Culture  Setup Time   Final    GRAM NEGATIVE RODS IN BOTH AEROBIC AND ANAEROBIC BOTTLES CRITICAL VALUE NOTED.  VALUE IS CONSISTENT WITH PREVIOUSLY REPORTED AND CALLED VALUE.    Culture (A)  Final     ESCHERICHIA COLI SUSCEPTIBILITIES PERFORMED ON PREVIOUS CULTURE WITHIN THE LAST 5 DAYS. Performed at [facility], [place] [address], [place], [place] [address]    Report Status 12/08/2017 FINAL  Final  Culture, blood (routine x 2)     Status: Abnormal   Collection Time: 12/03/17 10:44 AM  Result Value Ref Range Status   Specimen Description   Final    BLOOD LEFT HAND Performed at [facility] [address], [place], [place] [address]    Special Requests   Final    BOTTLES DRAWN AEROBIC AND ANAEROBIC Blood Culture adequate volume Performed at [facility] [address] [place], [place] [address]    Culture  Setup Time   Final    GRAM NEGATIVE RODS IN BOTH AEROBIC AND ANAEROBIC BOTTLES Organism ID to follow CRITICAL RESULT CALLED TO, READ BACK BY AND VERIFIED WITH: E.WILLIAMSON,PHARMD AT 2145 ON 12/03/17 BY G.MCADOO Performed at [facility], [place] [address], [place],  [address]    Culture (A)  Final    ESCHERICHIA COLI Confirmed Extended Spectrum Beta-Lactamase Producer (ESBL).  In bloodstream infections from ESBL organisms, carbapenems are preferred over piperacillin/tazobactam. They are shown to have a lower risk of mortality.    Report Status 12/05/2017 FINAL  Final   Organism ID, Bacteria ESCHERICHIA COLI  Final      Susceptibility   Escherichia coli - MIC*    AMPICILLIN >=32 RESISTANT Resistant     CEFAZOLIN >=64 RESISTANT Resistant     CEFEPIME RESISTANT Resistant     CEFTAZIDIME >=64 RESISTANT Resistant     CEFTRIAXONE >=64 RESISTANT Resistant     CIPROFLOXACIN 1 SENSITIVE Sensitive     GENTAMICIN <=1 SENSITIVE Sensitive     IMIPENEM 1 SENSITIVE Sensitive     TRIMETH/SULFA <=20 SENSITIVE Sensitive     AMPICILLIN/SULBACTAM >=32 RESISTANT Resistant     PIP/TAZO >=128 RESISTANT Resistant     Extended ESBL POSITIVE Resistant     *  ESCHERICHIA COLI  Blood Culture ID Panel (Reflexed)     Status: Abnormal    Collection Time: 12/03/17 10:44 AM  Result Value Ref Range Status   Enterococcus species NOT DETECTED NOT DETECTED Final   Listeria monocytogenes NOT DETECTED NOT DETECTED Final   Staphylococcus species NOT DETECTED NOT DETECTED Final   Staphylococcus aureus (BCID) NOT DETECTED NOT DETECTED Final   Streptococcus species NOT DETECTED NOT DETECTED Final   Streptococcus agalactiae NOT DETECTED NOT DETECTED Final   Streptococcus pneumoniae NOT DETECTED NOT DETECTED Final   Streptococcus pyogenes NOT DETECTED NOT DETECTED Final   Acinetobacter baumannii NOT DETECTED NOT DETECTED Final   Enterobacteriaceae species DETECTED (A) NOT DETECTED Corrected    Comment: Enterobacteriaceae represent a large family of gram-negative bacteria, not a single organism. CRITICAL RESULT CALLED TO, READ BACK BY AND VERIFIED WITH: E.WILLIAMSON,PHARMD AT 2145 ON 12/03/17 BY G.MCADOO CORRECTED ON 11/27 AT 2249: PREVIOUSLY REPORTED AS DETECTED Enterobacteriaceae represent a large family of gram negative bacteria, not a single organism. CRITICAL RESULT CALLED TO, READ BACK BY AND VERIFIED WITH: H.BAIRD,PHARMD AT 2143 ON 12/03/17 BY  G.MCADOO    Enterobacter cloacae complex NOT DETECTED NOT DETECTED Final   Escherichia coli DETECTED (A) NOT DETECTED Corrected    Comment: CRITICAL RESULT CALLED TO, READ BACK BY AND VERIFIED WITH: E.WILLIAMSON,PHARMD AT 2145 ON 12/03/17 BY G.MCADOO CORRECTED ON 11/27 AT 2249: PREVIOUSLY REPORTED AS DETECTED CRITICAL RESULT CALLED TO, READ BACK BY AND VERIFIED WITH: H.BAIRD,PHARMD AT 2143 ON 12/03/17 BY G.MCADOO    Klebsiella oxytoca NOT DETECTED NOT DETECTED Final   Klebsiella pneumoniae NOT DETECTED NOT DETECTED Final   Proteus species NOT DETECTED NOT DETECTED Final   Serratia marcescens NOT DETECTED NOT DETECTED Final   Haemophilus influenzae NOT DETECTED NOT DETECTED Final   Neisseria meningitidis NOT DETECTED NOT DETECTED Final   Pseudomonas aeruginosa NOT DETECTED NOT DETECTED  Final   Candida albicans NOT DETECTED NOT DETECTED Final   Candida glabrata NOT DETECTED NOT DETECTED Final   Candida krusei NOT DETECTED NOT DETECTED Final   Candida parapsilosis NOT DETECTED NOT DETECTED Final   Candida tropicalis NOT DETECTED NOT DETECTED Final    Comment: Performed at [facility], [place] [address], [place], [place] [address]      Studies:  Ir Exchange Biliary Drain  Result Date: 12/09/2017 INDICATION: Cholangiocarcinoma of liver, primarily within the left lobe causing biliary obstruction. Status post biliary drain placement from a right lobe approach on 10/26/2017. Admitted with sepsis and now adequately treated for bacteremia. Assessment of bile ducts performed to determine need for internal biliary stenting and whether the external drain can be removed. EXAM: EXCHANGE OF BILIARY DRAINAGE CATHETER WITH CHOLANGIOGRAM MEDICATIONS: 1 g IV meropenem; The antibiotic was administered within an appropriate time frame prior to the initiation of the procedure. ANESTHESIA/SEDATION: Moderate (conscious) sedation was employed during this procedure. A total of Versed 3.0 mg and Fentanyl 100 mcg was administered intravenously. Moderate Sedation Time: 23 minutes. The patient's level of consciousness and vital signs were monitored continuously by radiology nursing throughout the procedure under my direct supervision. FLUOROSCOPY TIME:  Fluoroscopy Time: 3 minutes and 18 seconds. 84.8 mGy. COMPLICATIONS: None immediate. PROCEDURE: Informed written consent was obtained from the patient after a thorough discussion of the procedural risks, benefits and alternatives. All questions were addressed. Maximal Sterile Barrier Technique was utilized including caps, mask, sterile gowns, sterile gloves, sterile drape, hand hygiene and skin antiseptic. A timeout was performed prior to the initiation of the  procedure. Initially, the indwelling 10 [place] biliary drainage catheter was removed over a  guidewire. A 7 French sheath was advanced into the right lobe of the liver and a cholangiogram performed with visualization of biliary drainage in the right lobe as well as extrahepatic bile ducts. The sheath positioning was altered and a 5 [place] catheter also advanced parallel to the guidewire in attempting opacification of left lobe bile ducts. A new 10 French internal/external biliary drainage catheter was then advanced over a guidewire. The distal portion was formed in the duodenum. Catheter positioning was confirmed by fluoroscopy after contrast injection. The drainage catheter was flushed with saline and capped. It was secured at the skin with a Prolene retention suture. FINDINGS: Initial cholangiogram shows fairly good drainage throughout the right lobe bile ducts and extrahepatic bile ducts without evidence of significant obstruction. There is some mild narrowing of central right intrahepatic ducts near the confluence with left lobe ducts. The common bile duct is normally patent. The cystic duct is patent with reflux of contrast noted into the gallbladder. Contrast enters the duodenum normally. Left lobe ducts appear isolated and likely obstructed centrally with no ability to opacified left lobe bile ducts by right-sided cholangiogram. Injection of a 5 French catheter introduced into central bile ducts could not opacify left lobe ducts. It was evident that there is currently not a need for right-sided permanent indwelling biliary stenting. In addition, placement of a central right-sided biliary stent would completely isolate any existing drainage of left-sided bile ducts. For this reason, the biliary drain was exchanged and will be left capped for a trial of internal drainage. Should bilirubin levels rise, there may be a need for separate percutaneous drainage of left-sided bile ducts at a different setting. IMPRESSION: 1. Cholangiogram shows good drainage throughout the right lobe bile ducts and  extrahepatic bile ducts without evidence of significant obstruction. There is some degree of narrowing of central right intrahepatic bile ducts near the biliary confluence with left lobe ducts. However, there was not enough obstruction present to warrant internal stenting and stenting would also isolate left lobe bile ducts. The current right-sided internal/external biliary drainage catheter was exchanged for a new 10 French catheter. This will be initially capped for a trial of internal drainage. 2. Should bilirubin levels rise, there may be a need for an additional separate percutaneous drainage procedure of left lobe bile ducts. Electronically Signed   By: Aletta Edouard M.D.   On: 12/09/2017 13:39   Ir Imaging Guided Port Insertion  Result Date: 12/09/2017 CLINICAL DATA:  Cholangiocarcinoma and need for porta cath for chemotherapy. EXAM: IMPLANTED PORT A CATH PLACEMENT WITH ULTRASOUND AND FLUOROSCOPIC GUIDANCE ANESTHESIA/SEDATION: 3.0 mg IV Versed; 100 mcg IV Fentanyl Total Moderate Sedation Time:  37 minutes The patient's level of consciousness and physiologic status were continuously monitored during the procedure by Radiology nursing. Additional Medications: A scheduled dose 1 g IV meropenem with given just prior to the procedure. FLUOROSCOPY TIME:  42 seconds.  16.7 mGy. PROCEDURE: The procedure, risks, benefits, and alternatives were explained to the patient. Questions regarding the procedure were encouraged and answered. The patient understands and consents to the procedure. A time-out was performed prior to initiating the procedure. Ultrasound was utilized to confirm patency of the right internal jugular vein. The right neck and chest were prepped with chlorhexidine in a sterile fashion, and a sterile drape was applied covering the operative field. Maximum barrier sterile technique with sterile gowns and gloves were used for the procedure. Local  anesthesia was provided with 1% lidocaine. After  creating a small venotomy incision, a 21 gauge needle was advanced into the right internal jugular vein under direct, real-time ultrasound guidance. Ultrasound image documentation was performed. After securing guidewire access, an 8 Fr dilator was placed. A J-wire was kinked to measure appropriate catheter length. A subcutaneous port pocket was then created along the upper chest wall utilizing sharp and blunt dissection. Portable cautery was utilized. The pocket was irrigated with sterile saline. A single lumen power injectable port was chosen for placement. The 8 Fr catheter was tunneled from the port pocket site to the venotomy incision. The port was placed in the pocket. External catheter was trimmed to appropriate length based on guidewire measurement. At the venotomy, an 8 Fr peel-away sheath was placed over a guidewire. The catheter was then placed through the sheath and the sheath removed. Final catheter positioning was confirmed and documented with a fluoroscopic spot image. The port was accessed with a needle and aspirated and flushed with heparinized saline. The access needle was removed. The venotomy and port pocket incisions were closed with subcutaneous 3-0 Monocryl and subcuticular 4-0 Vicryl. Dermabond was applied to both incisions. COMPLICATIONS: COMPLICATIONS None FINDINGS: After catheter placement, the tip lies at the cavo-atrial junction. The catheter aspirates normally and is ready for immediate use. IMPRESSION: Placement of single lumen port a cath via right internal jugular vein. The catheter tip lies at the cavo-atrial junction. A power injectable port a cath was placed and is ready for immediate use. Electronically Signed   By: Aletta Edouard M.D.   On: 12/09/2017 14:37    Assessment: 62 y.o. male with recently diagnosedmetastatic cholangiocarcinoma to liver, lung and adrenal gland,untreated, but struct of jaundice, status post percutaneous biliary drainage tube placement, liver  cirrhosis, multiple recent hospitalization for sepsis and dehydration, was admitted for sepsis from bacteremia  1. Sepsis from bacteremia of ESBL E. Coli, improving  2.  Septic shock, resolved 3.Metastatic cholangiocarcinoma, untreated  4. Recurrent sepsis and bacteremia, source likely the biliary drain  5. Deconditioning  6. DNR/DNI 7.  Hypertension and diabetes   Plan:  -I spoke with Dr. Kathlene Cote yesterday, he is cholangiogram showed no significant obstruction in the right lobe bile duct and CBD, the current right side external and internal biliary drainage catheter was exchanged, and external catheter has been capped. Will watch his LFTs closely, if bil going up, Dr. Kathlene Cote may consider a separated percutaneous drainage procedure of the left lobe bile duct. -He has much improved from his sepsis, currently on meropenem, plan to discharge tomorrow with IV antibiotics if repeat culture negative. -I will arrange outpt chemo, likely late next week or the week of 12/16, I will see him next week in my clinic for f/u  -I encouraged him to continue eat well and exercise.    [name], MD 12/10/2017  7:57 PM

## 2017-12-10 NOTE — Progress Notes (Signed)
[facility] will  Provide Home Infusion pharmacy services at [place] for home IV ABX  If patient discharges after hours, please call [phone].   Larry Sierras 12/10/2017, 12:25 PM

## 2017-12-10 NOTE — Progress Notes (Signed)
Benefit check for ertapenem $95. [facility] iv infusion following.

## 2017-12-10 NOTE — Progress Notes (Signed)
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  11-26 patient was seen by IR for biliary drain flushing, he also was seen by his oncologist that day.  Referred for admission due to worsening abdominal pain, vomiting.    Patient was found to have septic shock secondary to ESBL E. coli bacteremia with lactic acidosis.  Assessment & Plan:   Active Problems:   Hypertension   Hyponatremia   Alcoholic cirrhosis of liver without ascites (HCC)   Biliary obstruction due to malignant neoplasm (HCC)   Cirrhosis (HCC)   Intrahepatic cholangiocarcinoma (HCC)   Hypotension   Acute renal failure (ARF) (HCC)   Severe sepsis with acute organ dysfunction due to Gram negative bacteria (HCC)   Septic shock (HCC)   Cholangiocarcinoma (HCC)   Sepsis due to gram-negative bacteria ([place])   1-shock secondary to ESBL, E. coli bacteremia lactic acidosis Most likely biliary source considering his previous history and presence of biliary drain.  CT scan of the abdomen showed that the drain was in good position Patient was evaluated by critical care service, he had a central line, he was a started on IV pressors.  Course of hospitalization sepsis has resolved. Was a started on meropenem for E. coli ESBL bacteremia. Patient was seen by interventional radiology, since the bile duct are open with only mild intrahepatic narrowing and good flow there was no need to do any stent placement. His biliary drain was internalized. It was discussed with ID recommendation is for total 14 days of IV antibiotics.  She did  have 5 more days of meropenem. We will repeat blood cultures today.  If negative by tomorrow plan to discharge him on IV antibiotics.  History of cholangiocarcinoma with metastasis with bilateral drain: Patient was evaluated by medical oncology and interventional radiology. His alkaline phosphatase and liver function tests were up today. He ask IR to follow-up on patient. They recommend to keep the drain clamped.  Is suspected for liver function test to increase.  Patient developed nausea vomiting worsening abdominal pain they instructed the patient to open the drain.   will need close follow-up and liver function tests with his oncology.   Mild hyponatremia due to hypovolemia improved.  Normocytic anemia; Stable monitor.  Mild AKI resolved.  Diabetes mellitus type 2 with hyperglycemia/peripheral neuropathy HbA1c 4.9 on November 3.  Patient has had a few hypoglycemic episodes in the hospital which could be due to sepsis.  He is on a regular diet.  CBGs are normal now.   Hold oral hypoglycemic agents.   Essential hypertension Blood pressures have improved.  Continue to hold lisinopril. RN Pressure Injury Documentation:    Malnutrition Type:  Nutrition Problem: Moderate Malnutrition Etiology: chronic illness, catabolic illness, cancer and cancer related treatments   Malnutrition Characteristics:  Signs/Symptoms: mild fat depletion, moderate fat depletion, mild muscle depletion, moderate muscle depletion   Nutrition Interventions:  Interventions: Boost Plus  Estimated body mass index is 29.29 kg/m as calculated from the following:   Height as of this encounter: 5' 11"  (1.803 m).   Weight as of this encounter: 95.3 kg.   DVT prophylaxis:  Code Status: DNR Family Communication:  care discussed with patient.  Disposition Plan: Home tomorrow if blood culture negative   Consultants:   IR Oncology   Procedures: port cath    Antimicrobials  Meropenem.     Subjective: Denies abdominal pain.   Objective: Vitals:   12/09/17 1339 12/09/17 2118 12/10/17 0443 12/10/17 1247  BP: 107/72 110/70 107/71 96/68  Pulse: 92 91 96 98  Resp: 17 16 16    Temp: 98.5 F (36.9 C) 98.7 F (37.1 C) 98.5 F (36.9 C) 98.3 F (36.8 C)  TempSrc: Oral Oral Oral Oral  SpO2: 100% 99% 97% 97%  Weight:      Height:        Intake/Output Summary (Last 24 hours) at 12/10/2017 1726 Last data filed at 12/10/2017 1414 Gross per 24 hour  Intake 1300 ml  Output 800 ml  Net 500 ml   Filed Weights   12/05/17 0500 12/07/17 0421 12/09/17 0542  Weight: 94.9 kg 96.8 kg 95.3 kg    Examination:  General exam: Appears calm and comfortable , icteric  Respiratory system: Clear to auscultation. Respiratory effort normal. Cardiovascular system: S1 & S2 heard, RRR. No JVD, murmurs, rubs, gallops or clicks.  Gastrointestinal system: Abdomen is nondistended, soft and nontender. No organomegaly or masses felt. Central nervous system: Alert and oriented. No focal neurological deficits. Extremities: Symmetric 5 x 5 power. Skin: No rashes, lesions or ulcers    Data Reviewed: I have personally reviewed following labs and imaging studies  CBC: Recent Labs  Lab 12/05/17 0727 12/07/17 0415 12/08/17 0342 12/09/17 0613 12/10/17 0438  WBC 7.0 4.0 5.8 5.5 6.0  NEUTROABS  --   --   --  3.2  --   HGB 8.4* 8.0* 8.7* 9.0* 9.2*  HCT 26.7* 26.0* 28.0* 29.1* 29.4*  MCV 93.0 91.9 91.8 92.1 93.6  PLT 125* 123* 149* 204 742   Basic Metabolic Panel: Recent Labs  Lab 12/06/17 0530 12/07/17 0415 12/08/17 0342 12/09/17 0613 12/10/17 0438  NA 134* 136 136 135 136  K 3.9 3.7 3.7 3.8 4.0  CL 107 109 109 106 104  CO2 20* 22 21* 22 26  GLUCOSE 130* 132* 137* 133* 127*  BUN 11 12 12 13 12   CREATININE 0.56* 0.66 0.46* 0.41* 0.48*  CALCIUM 8.1* 8.1* 8.3* 8.4* 8.5*   GFR: Estimated Creatinine Clearance: 109.9 mL/min (A) (by C-G formula based on SCr of 0.48 mg/dL (L)). Liver  Function Tests: Recent Labs  Lab 12/06/17 0530 12/07/17 0415 12/08/17 0342 12/09/17 0613 12/10/17 0438  AST 30 28 29 31  59*  ALT 31 28 30 31  46*  ALKPHOS 203* 225* 310* 374* 548*  BILITOT 2.2* 2.0* 1.8* 1.7* 2.0*  PROT 6.4* 6.3* 6.6 6.8 6.9  ALBUMIN 2.2* 2.2* 2.3* 2.3* 2.3*   No results for input(s): LIPASE, AMYLASE in the last 168 hours. No results for input(s): AMMONIA in the last 168 hours. Coagulation Profile: Recent Labs  Lab 12/09/17 0613  INR 1.29   Cardiac Enzymes: No results for input(s): CKTOTAL, CKMB, CKMBINDEX, TROPONINI in the last 168 hours. BNP (last 3 results) No results for input(s): PROBNP in the last 8760 hours. HbA1C: No results for input(s): HGBA1C in the last 72 hours. CBG: Recent Labs  Lab 12/09/17 1137 12/09/17 1637 12/09/17 2221 12/10/17 0736 12/10/17 1132  GLUCAP 102* 152* 161* 104* 148*   Lipid Profile: No results for input(s): CHOL, HDL, LDLCALC, TRIG, CHOLHDL, LDLDIRECT in the last 72 hours. Thyroid Function Tests: No results for input(s): TSH, T4TOTAL, FREET4, T3FREE,  THYROIDAB in the last 72 hours. Anemia Panel: No results for input(s): VITAMINB12, FOLATE, FERRITIN, TIBC, IRON, RETICCTPCT in the last 72 hours. Sepsis Labs: No results for input(s): PROCALCITON, LATICACIDVEN in the last 168 hours.  Recent Results (from the past 240 hour(s))  Culture, Blood     Status: None   Collection Time: 12/02/17 12:51 PM  Result Value Ref Range Status   Specimen Description BLOOD LEFT HAND  Final   Special Requests   Final    BOTTLES DRAWN AEROBIC AND ANAEROBIC Blood Culture adequate volume   Culture   Final    NO GROWTH 5 DAYS Performed at [facility], [address] [place], [place] [address]    Report Status 12/07/2017 FINAL  Final  Culture, Blood     Status: Abnormal   Collection Time: 12/02/17 12:51 PM  Result Value Ref Range Status   Specimen Description BLOOD RIGHT HAND  Final   Special Requests   Final    BOTTLES DRAWN  AEROBIC AND ANAEROBIC Blood Culture adequate volume   Culture  Setup Time   Final    GRAM NEGATIVE RODS ANAEROBIC BOTTLE ONLY Organism ID to follow    Culture (A)  Final    ESCHERICHIA COLI SUSCEPTIBILITIES PERFORMED ON PREVIOUS CULTURE WITHIN THE LAST 5 DAYS. Performed at [facility], [place] [address], [place], [place] [address]    Report Status 12/05/2017 FINAL  Final  Blood Culture ID Panel (Reflexed)     Status: Abnormal   Collection Time: 12/02/17 12:51 PM  Result Value Ref Range Status   Enterococcus species NOT DETECTED NOT DETECTED Final   Listeria monocytogenes NOT DETECTED NOT DETECTED Final   Staphylococcus species NOT DETECTED NOT DETECTED Final   Staphylococcus aureus (BCID) NOT DETECTED NOT DETECTED Final   Streptococcus species NOT DETECTED NOT DETECTED Final   Streptococcus agalactiae NOT DETECTED NOT DETECTED Final   Streptococcus pneumoniae NOT DETECTED NOT DETECTED Final   Streptococcus pyogenes NOT DETECTED NOT DETECTED Final   Acinetobacter baumannii NOT DETECTED NOT DETECTED Final   Enterobacteriaceae species DETECTED (A) NOT DETECTED Final    Comment: Enterobacteriaceae represent a large family of gram-negative bacteria, not a single organism. CRITICAL RESULT CALLED TO, READ BACK BY AND VERIFIED WITH: E.WILLIAMSON,PHARMD AT 2145 ON 12/03/17 BY G.MCADOO    Enterobacter cloacae complex NOT DETECTED NOT DETECTED Final   Escherichia coli DETECTED (A) NOT DETECTED Final    Comment: CRITICAL RESULT CALLED TO, READ BACK BY AND VERIFIED WITH: E.WILLIAMSON,PHARMD AT 2145 ON 12/03/17 BY G.MCADOO    Klebsiella oxytoca NOT DETECTED NOT DETECTED Final   Klebsiella pneumoniae NOT DETECTED NOT DETECTED Final   Proteus species NOT DETECTED NOT DETECTED Final   Serratia marcescens NOT DETECTED NOT DETECTED Final   Carbapenem resistance NOT DETECTED NOT DETECTED Final   Haemophilus influenzae NOT DETECTED NOT DETECTED Final   Neisseria meningitidis NOT DETECTED NOT  DETECTED Final   Pseudomonas aeruginosa NOT DETECTED NOT DETECTED Final   Candida albicans NOT DETECTED NOT DETECTED Final   Candida glabrata NOT DETECTED NOT DETECTED Final   Candida krusei NOT DETECTED NOT DETECTED Final   Candida parapsilosis NOT DETECTED NOT DETECTED Final   Candida tropicalis NOT DETECTED NOT DETECTED Final    Comment: Performed at [facility], [place] [address] [place], [place] [address]  Culture, blood (routine x 2)     Status: Abnormal   Collection Time: 12/03/17  9:54 AM  Result Value Ref Range Status  Specimen Description   Final    LEFT ANTECUBITAL Performed at [place] [address], [place], [place] [address]    Special Requests   Final    BOTTLES DRAWN AEROBIC AND ANAEROBIC Blood Culture adequate volume Performed at [place] [address] [place], [place] [address]    Culture  Setup Time   Final    GRAM NEGATIVE RODS IN BOTH AEROBIC AND ANAEROBIC BOTTLES CRITICAL VALUE NOTED.  VALUE IS CONSISTENT WITH PREVIOUSLY REPORTED AND CALLED VALUE.    Culture (A)  Final    ESCHERICHIA COLI SUSCEPTIBILITIES PERFORMED ON PREVIOUS CULTURE WITHIN THE LAST 5 DAYS. Performed at [facility], [place] [address] [place], [place] [address]    Report Status 12/08/2017 FINAL  Final  Culture, blood (routine x 2)     Status: Abnormal   Collection Time: 12/03/17 10:44 AM  Result Value Ref Range Status   Specimen Description   Final    BLOOD LEFT HAND Performed at [place] [address], [place], [place] [address]    Special Requests   Final    BOTTLES DRAWN AEROBIC AND ANAEROBIC Blood Culture adequate volume Performed at [place] [address] [place], [place] [address]    Culture  Setup Time   Final    GRAM NEGATIVE RODS IN BOTH AEROBIC AND ANAEROBIC BOTTLES Organism ID to follow CRITICAL RESULT CALLED TO, READ BACK BY AND VERIFIED WITH:  E.WILLIAMSON,PHARMD AT 2145 ON 12/03/17 BY G.MCADOO Performed at [facility], [place] [address], [place], [place] [address]    Culture (A)  Final    ESCHERICHIA COLI Confirmed Extended Spectrum Beta-Lactamase Producer (ESBL).  In bloodstream infections from ESBL organisms, carbapenems are preferred over piperacillin/tazobactam. They are shown to have a lower risk of mortality.    Report Status 12/05/2017 FINAL  Final   Organism ID, Bacteria ESCHERICHIA COLI  Final      Susceptibility   Escherichia coli - MIC*    AMPICILLIN >=32 RESISTANT Resistant     CEFAZOLIN >=64 RESISTANT Resistant     CEFEPIME RESISTANT Resistant     CEFTAZIDIME >=64 RESISTANT Resistant     CEFTRIAXONE >=64 RESISTANT Resistant     CIPROFLOXACIN 1 SENSITIVE Sensitive     GENTAMICIN <=1 SENSITIVE Sensitive     IMIPENEM 1 SENSITIVE Sensitive     TRIMETH/SULFA <=20 SENSITIVE Sensitive     AMPICILLIN/SULBACTAM >=32 RESISTANT Resistant     PIP/TAZO >=128 RESISTANT Resistant     Extended ESBL POSITIVE Resistant     * ESCHERICHIA COLI  Blood Culture ID Panel (Reflexed)     Status: Abnormal   Collection Time: 12/03/17 10:44 AM  Result Value Ref Range Status   Enterococcus species NOT DETECTED NOT DETECTED Final   Listeria monocytogenes NOT DETECTED NOT DETECTED Final   Staphylococcus species NOT DETECTED NOT DETECTED Final   Staphylococcus aureus (BCID) NOT DETECTED NOT DETECTED Final   Streptococcus species NOT DETECTED NOT DETECTED Final   Streptococcus agalactiae NOT DETECTED NOT DETECTED Final   Streptococcus pneumoniae NOT DETECTED NOT DETECTED Final   Streptococcus pyogenes NOT DETECTED NOT DETECTED Final   Acinetobacter baumannii NOT DETECTED NOT DETECTED Final   Enterobacteriaceae species DETECTED (A) NOT DETECTED Corrected    Comment: Enterobacteriaceae represent a large family of gram-negative bacteria, not a single organism. CRITICAL RESULT CALLED TO, READ BACK BY AND VERIFIED  WITH: E.WILLIAMSON,PHARMD AT 2145 ON 12/03/17 BY G.MCADOO CORRECTED ON 11/27  AT 2249: PREVIOUSLY REPORTED AS DETECTED Enterobacteriaceae represent a large family of gram negative bacteria, not a single organism. CRITICAL RESULT CALLED TO, READ BACK BY AND VERIFIED WITH: H.BAIRD,PHARMD AT 2143 ON 12/03/17 BY  G.MCADOO    Enterobacter cloacae complex NOT DETECTED NOT DETECTED Final   Escherichia coli DETECTED (A) NOT DETECTED Corrected    Comment: CRITICAL RESULT CALLED TO, READ BACK BY AND VERIFIED WITH: E.WILLIAMSON,PHARMD AT 2145 ON 12/03/17 BY G.MCADOO CORRECTED ON 11/27 AT 2249: PREVIOUSLY REPORTED AS DETECTED CRITICAL RESULT CALLED TO, READ BACK BY AND VERIFIED WITH: H.BAIRD,PHARMD AT 2143 ON 12/03/17 BY G.MCADOO    Klebsiella oxytoca NOT DETECTED NOT DETECTED Final   Klebsiella pneumoniae NOT DETECTED NOT DETECTED Final   Proteus species NOT DETECTED NOT DETECTED Final   Serratia marcescens NOT DETECTED NOT DETECTED Final   Haemophilus influenzae NOT DETECTED NOT DETECTED Final   Neisseria meningitidis NOT DETECTED NOT DETECTED Final   Pseudomonas aeruginosa NOT DETECTED NOT DETECTED Final   Candida albicans NOT DETECTED NOT DETECTED Final   Candida glabrata NOT DETECTED NOT DETECTED Final   Candida krusei NOT DETECTED NOT DETECTED Final   Candida parapsilosis NOT DETECTED NOT DETECTED Final   Candida tropicalis NOT DETECTED NOT DETECTED Final    Comment: Performed at [facility], [place] [address], [place], [place] [address]         Radiology Studies: Ir Exchange Biliary Drain  Result Date: 12/09/2017 INDICATION: Cholangiocarcinoma of liver, primarily within the left lobe causing biliary obstruction. Status post biliary drain placement from a right lobe approach on 10/26/2017. Admitted with sepsis and now adequately treated for bacteremia. Assessment of bile ducts performed to determine need for internal biliary stenting and whether the external drain can be removed. EXAM:  EXCHANGE OF BILIARY DRAINAGE CATHETER WITH CHOLANGIOGRAM MEDICATIONS: 1 g IV meropenem; The antibiotic was administered within an appropriate time frame prior to the initiation of the procedure. ANESTHESIA/SEDATION: Moderate (conscious) sedation was employed during this procedure. A total of Versed 3.0 mg and Fentanyl 100 mcg was administered intravenously. Moderate Sedation Time: 23 minutes. The patient's level of consciousness and vital signs were monitored continuously by radiology nursing throughout the procedure under my direct supervision. FLUOROSCOPY TIME:  Fluoroscopy Time: 3 minutes and 18 seconds. 84.8 mGy. COMPLICATIONS: None immediate. PROCEDURE: Informed written consent was obtained from the patient after a thorough discussion of the procedural risks, benefits and alternatives. All questions were addressed. Maximal Sterile Barrier Technique was utilized including caps, mask, sterile gowns, sterile gloves, sterile drape, hand hygiene and skin antiseptic. A timeout was performed prior to the initiation of the procedure. Initially, the indwelling 10 [place] biliary drainage catheter was removed over a guidewire. A 7 French sheath was advanced into the right lobe of the liver and a cholangiogram performed with visualization of biliary drainage in the right lobe as well as extrahepatic bile ducts. The sheath positioning was altered and a 5 [place] catheter also advanced parallel to the guidewire in attempting opacification of left lobe bile ducts. A new 10 French internal/external biliary drainage catheter was then advanced over a guidewire. The distal portion was formed in the duodenum. Catheter positioning was confirmed by fluoroscopy after contrast injection. The drainage catheter was flushed with saline and capped. It was secured at the skin with a Prolene retention suture. FINDINGS: Initial cholangiogram shows fairly good drainage throughout the right lobe bile ducts and extrahepatic bile ducts without  evidence of significant obstruction. There is some mild narrowing of central right  intrahepatic ducts near the confluence with left lobe ducts. The common bile duct is normally patent. The cystic duct is patent with reflux of contrast noted into the gallbladder. Contrast enters the duodenum normally. Left lobe ducts appear isolated and likely obstructed centrally with no ability to opacified left lobe bile ducts by right-sided cholangiogram. Injection of a 5 French catheter introduced into central bile ducts could not opacify left lobe ducts. It was evident that there is currently not a need for right-sided permanent indwelling biliary stenting. In addition, placement of a central right-sided biliary stent would completely isolate any existing drainage of left-sided bile ducts. For this reason, the biliary drain was exchanged and will be left capped for a trial of internal drainage. Should bilirubin levels rise, there may be a need for separate percutaneous drainage of left-sided bile ducts at a different setting. IMPRESSION: 1. Cholangiogram shows good drainage throughout the right lobe bile ducts and extrahepatic bile ducts without evidence of significant obstruction. There is some degree of narrowing of central right intrahepatic bile ducts near the biliary confluence with left lobe ducts. However, there was not enough obstruction present to warrant internal stenting and stenting would also isolate left lobe bile ducts. The current right-sided internal/external biliary drainage catheter was exchanged for a new 10 French catheter. This will be initially capped for a trial of internal drainage. 2. Should bilirubin levels rise, there may be a need for an additional separate percutaneous drainage procedure of left lobe bile ducts. Electronically Signed   By: Aletta Edouard M.D.   On: 12/09/2017 13:39   Ir Imaging Guided Port Insertion  Result Date: 12/09/2017 CLINICAL DATA:  Cholangiocarcinoma and need for porta  cath for chemotherapy. EXAM: IMPLANTED PORT A CATH PLACEMENT WITH ULTRASOUND AND FLUOROSCOPIC GUIDANCE ANESTHESIA/SEDATION: 3.0 mg IV Versed; 100 mcg IV Fentanyl Total Moderate Sedation Time:  37 minutes The patient's level of consciousness and physiologic status were continuously monitored during the procedure by Radiology nursing. Additional Medications: A scheduled dose 1 g IV meropenem with given just prior to the procedure. FLUOROSCOPY TIME:  42 seconds.  16.7 mGy. PROCEDURE: The procedure, risks, benefits, and alternatives were explained to the patient. Questions regarding the procedure were encouraged and answered. The patient understands and consents to the procedure. A time-out was performed prior to initiating the procedure. Ultrasound was utilized to confirm patency of the right internal jugular vein. The right neck and chest were prepped with chlorhexidine in a sterile fashion, and a sterile drape was applied covering the operative field. Maximum barrier sterile technique with sterile gowns and gloves were used for the procedure. Local anesthesia was provided with 1% lidocaine. After creating a small venotomy incision, a 21 gauge needle was advanced into the right internal jugular vein under direct, real-time ultrasound guidance. Ultrasound image documentation was performed. After securing guidewire access, an 8 Fr dilator was placed. A J-wire was kinked to measure appropriate catheter length. A subcutaneous port pocket was then created along the upper chest wall utilizing sharp and blunt dissection. Portable cautery was utilized. The pocket was irrigated with sterile saline. A single lumen power injectable port was chosen for placement. The 8 Fr catheter was tunneled from the port pocket site to the venotomy incision. The port was placed in the pocket. External catheter was trimmed to appropriate length based on guidewire measurement. At the venotomy, an 8 Fr peel-away sheath was placed over a  guidewire. The catheter was then placed through the sheath and the sheath removed. Final catheter  positioning was confirmed and documented with a fluoroscopic spot image. The port was accessed with a needle and aspirated and flushed with heparinized saline. The access needle was removed. The venotomy and port pocket incisions were closed with subcutaneous 3-0 Monocryl and subcuticular 4-0 Vicryl. Dermabond was applied to both incisions. COMPLICATIONS: COMPLICATIONS None FINDINGS: After catheter placement, the tip lies at the cavo-atrial junction. The catheter aspirates normally and is ready for immediate use. IMPRESSION: Placement of single lumen port a cath via right internal jugular vein. The catheter tip lies at the cavo-atrial junction. A power injectable port a cath was placed and is ready for immediate use. Electronically Signed   By: Aletta Edouard M.D.   On: 12/09/2017 14:37        Scheduled Meds: . celecoxib  200 mg Oral QPM  . enoxaparin (LOVENOX) injection  40 mg Subcutaneous Q24H  . gabapentin  400 mg Oral Daily  . gabapentin  800 mg Oral QPM  . lactose free nutrition  237 mL Oral TID WC  . loratadine  10 mg Oral Daily  . sodium chloride flush  10-40 mL Intracatheter Q12H  . sodium chloride flush  10-40 mL Intracatheter Q12H   Continuous Infusions: . ertapenem 1,000 mg (12/09/17 1808)  . sodium chloride       LOS: 7 days    Time spent: 35 minutes.     Elmarie Shiley, MD [facility] Pager [phone]  If 7PM-7AM, please contact night-coverage [email or web address] Password [email or web address] 12/10/2017, 5:26 PM

## 2017-12-10 NOTE — Care Management Note (Signed)
Case Management Note  Patient Details  Name: Matthew Lewis MRN: [id number] Date of Birth: 1955/03/25  Subjective/Objective: [facility] chosen for [facility]-iv abx teaching-rep Santiago Glad aware. [facility] -meds/supplies for iv abx-rep Pam following-will initiate teaching in hospital with patient. Patient has spouse & son for support & teaching if needed. Has porta cath-[facility] able to use for iv abx infusion.                  Action/Plan:d/c home w/HHC/iv abx   Expected Discharge Date:  (unknown)               Expected Discharge Plan:     In-House Referral:     Discharge planning Services  CM Consult  Post Acute Care Choice:    Choice offered to:  Patient  DME Arranged:    DME Agency:     HH Arranged:  RN, IV Antibiotics HH Agency:     Status of Service:  In process, will continue to follow  If discussed at Long Length of Stay Meetings, dates discussed:    Additional Comments:  Dessa Phi, RN 12/10/2017, 12:28 PM

## 2017-12-10 NOTE — Plan of Care (Signed)
Plan of care reviewed and discussed with the patient. 

## 2017-12-10 NOTE — Progress Notes (Signed)
Referring Physician(s): Matthew Lewis,S/Matthew Lewis,Y  Supervising Physician: Matthew Lewis  Patient Status:  Matthew Lewis - In-pt  Chief Complaint:  cholangiocarcinoma  Subjective: Pt doing ok today; denies worsening abd pain,N/V ; has ambulated    Allergies: Morphine and related  Medications: Prior to Admission medications   Medication Sig Start Date End Date Taking? Authorizing Provider  aspirin EC 81 MG tablet Take 81 mg by mouth daily.   Yes [provider]  atorvastatin (LIPITOR) 40 MG tablet Take 1 tablet (40 mg total) by mouth daily at 6 PM. 12/02/12  Yes Matthew Lewis, Matthew C, MD  calcium carbonate (TUMS - DOSED IN MG ELEMENTAL CALCIUM) 500 MG chewable tablet Chew 1 tablet by mouth 2 (two) times daily as needed for indigestion or heartburn.   Yes [provider]  celecoxib (CELEBREX) 200 MG capsule Take 200 mg by mouth at bedtime.  10/10/17  Yes [provider]  empagliflozin (JARDIANCE) 10 MG TABS tablet Take 10 mg by mouth at bedtime.    Yes [provider]  gabapentin (NEURONTIN) 400 MG capsule Take 400-800 mg by mouth See admin instructions. Take 400 mg by mouth in the morning and 800 mg in the evening   Yes [provider]  glipiZIDE (GLUCOTROL) 5 MG tablet Take 5 mg by mouth 2 (two) times daily before a meal.   Yes [provider]  Insulin Detemir (LEVEMIR FLEXPEN) 100 UNIT/ML Pen Inject 10-62 Units into the skin See admin instructions. Take 62 units subq in the morning and 10 units in the evening   Yes [provider]  lisinopril (PRINIVIL,ZESTRIL) 20 MG tablet Take 10 mg by mouth daily.    Yes [provider]  metFORMIN (GLUCOPHAGE) 1000 MG tablet Take 1,000 mg by mouth 2 (two) times daily with a meal.   Yes [provider]  Multiple Vitamin (MULTIVITAMIN) tablet Take 1 tablet by mouth daily.   Yes [provider]  ondansetron (ZOFRAN) 8 MG tablet Take 1 tablet (8 mg total) by mouth 2 (two) times  daily as needed. Start on the third day after chemotherapy. 11/06/17  Yes Matthew [name], MD  polyvinyl alcohol (LIQUIFILM TEARS) 1.4 % ophthalmic solution Place 1 drop into both eyes daily as needed for dry eyes.    Yes [provider]  prochlorperazine (COMPAZINE) 10 MG tablet Take 1 tablet (10 mg total) by mouth every 6 (six) hours as needed (Nausea or vomiting). 11/06/17  Yes Matthew [name], MD  oxyCODONE 10 MG TABS Take 1 tablet (10 mg total) by mouth every 4 (four) hours as needed for severe pain. Patient not taking: Reported on 12/03/2017 10/29/17   Matthew Sells, MD     Vital Signs: BP 107/71 (BP Location: Left Arm)   Pulse 96   Temp 98.5 F (36.9 Lewis) (Oral)   Resp 16   Ht 5\' 11"  (1.803 m)   Wt 210 lb (95.3 kg)   SpO2 97%   BMI 29.29 kg/m   Physical Exam awake/alert; rt chest PAC site clean and dry; biliary drain in place and capped, no sig leaking of bile at site, NT  Imaging: Ir Exchange Biliary Drain  Result Date: 12/09/2017 INDICATION: Cholangiocarcinoma of liver, primarily within the left lobe causing biliary obstruction. Status post biliary drain placement from a right lobe approach on 10/26/2017. Admitted with sepsis and now adequately treated for bacteremia. Assessment of bile ducts performed to determine need for internal biliary stenting and whether the external drain can be removed. EXAM: EXCHANGE OF  BILIARY DRAINAGE CATHETER WITH CHOLANGIOGRAM MEDICATIONS: 1 g IV meropenem; The antibiotic was administered within an appropriate time frame prior to the initiation of the procedure. ANESTHESIA/SEDATION: Moderate (conscious) sedation was employed during this procedure. A total of Versed 3.0 mg and Fentanyl 100 mcg was administered intravenously. Moderate Sedation Time: 23 minutes. The patient's level of consciousness and vital signs were monitored continuously by radiology nursing throughout the procedure under my direct supervision. FLUOROSCOPY TIME:  Fluoroscopy Time: 3  minutes and 18 seconds. 84.8 mGy. COMPLICATIONS: None immediate. PROCEDURE: Informed written consent was obtained from the patient after a thorough discussion of the procedural risks, benefits and alternatives. All questions were addressed. Maximal Sterile Barrier Technique was utilized including caps, mask, sterile gowns, sterile gloves, sterile drape, hand hygiene and skin antiseptic. A timeout was performed prior to the initiation of the procedure. Initially, the indwelling 10 [place] biliary drainage catheter was removed over a guidewire. A 7 French sheath was advanced into the right lobe of the liver and a cholangiogram performed with visualization of biliary drainage in the right lobe as well as extrahepatic bile ducts. The sheath positioning was altered and a 5 [place] catheter also advanced parallel to the guidewire in attempting opacification of left lobe bile ducts. A new 10 French internal/external biliary drainage catheter was then advanced over a guidewire. The distal portion was formed in the duodenum. Catheter positioning was confirmed by fluoroscopy after contrast injection. The drainage catheter was flushed with saline and capped. It was secured at the skin with a Prolene retention suture. FINDINGS: Initial cholangiogram shows fairly good drainage throughout the right lobe bile ducts and extrahepatic bile ducts without evidence of significant obstruction. There is some mild narrowing of central right intrahepatic ducts near the confluence with left lobe ducts. The common bile duct is normally patent. The cystic duct is patent with reflux of contrast noted into the gallbladder. Contrast enters the duodenum normally. Left lobe ducts appear isolated and likely obstructed centrally with no ability to opacified left lobe bile ducts by right-sided cholangiogram. Injection of a 5 French catheter introduced into central bile ducts could not opacify left lobe ducts. It was evident that there is currently not a  need for right-sided permanent indwelling biliary stenting. In addition, placement of a central right-sided biliary stent would completely isolate any existing drainage of left-sided bile ducts. For this reason, the biliary drain was exchanged and will be left capped for a trial of internal drainage. Should bilirubin levels rise, there may be a need for separate percutaneous drainage of left-sided bile ducts at a different setting. IMPRESSION: 1. Cholangiogram shows good drainage throughout the right lobe bile ducts and extrahepatic bile ducts without evidence of significant obstruction. There is some degree of narrowing of central right intrahepatic bile ducts near the biliary confluence with left lobe ducts. However, there was not enough obstruction present to warrant internal stenting and stenting would also isolate left lobe bile ducts. The current right-sided internal/external biliary drainage catheter was exchanged for a new 10 French catheter. This will be initially capped for a trial of internal drainage. 2. Should bilirubin levels rise, there may be a need for an additional separate percutaneous drainage procedure of left lobe bile ducts. Electronically Signed   By: Aletta Edouard M.D.   On: 12/09/2017 13:39   Ir Imaging Guided Port Insertion  Result Date: 12/09/2017 CLINICAL DATA:  Cholangiocarcinoma and need for porta cath for chemotherapy. EXAM: IMPLANTED PORT A CATH PLACEMENT WITH ULTRASOUND AND FLUOROSCOPIC GUIDANCE ANESTHESIA/SEDATION:  3.0 mg IV Versed; 100 mcg IV Fentanyl Total Moderate Sedation Time:  37 minutes The patient's level of consciousness and physiologic status were continuously monitored during the procedure by Radiology nursing. Additional Medications: A scheduled dose 1 g IV meropenem with given just prior to the procedure. FLUOROSCOPY TIME:  42 seconds.  16.7 mGy. PROCEDURE: The procedure, risks, benefits, and alternatives were explained to the patient. Questions regarding the  procedure were encouraged and answered. The patient understands and consents to the procedure. A time-out was performed prior to initiating the procedure. Ultrasound was utilized to confirm patency of the right internal jugular vein. The right neck and chest were prepped with chlorhexidine in a sterile fashion, and a sterile drape was applied covering the operative field. Maximum barrier sterile technique with sterile gowns and gloves were used for the procedure. Local anesthesia was provided with 1% lidocaine. After creating a small venotomy incision, a 21 gauge needle was advanced into the right internal jugular vein under direct, real-time ultrasound guidance. Ultrasound image documentation was performed. After securing guidewire access, an 8 Fr dilator was placed. A J-wire was kinked to measure appropriate catheter length. A subcutaneous port pocket was then created along the upper chest wall utilizing sharp and blunt dissection. Portable cautery was utilized. The pocket was irrigated with sterile saline. A single lumen power injectable port was chosen for placement. The 8 Fr catheter was tunneled from the port pocket site to the venotomy incision. The port was placed in the pocket. External catheter was trimmed to appropriate length based on guidewire measurement. At the venotomy, an 8 Fr peel-away sheath was placed over a guidewire. The catheter was then placed through the sheath and the sheath removed. Final catheter positioning was confirmed and documented with a fluoroscopic spot image. The port was accessed with a needle and aspirated and flushed with heparinized saline. The access needle was removed. The venotomy and port pocket incisions were closed with subcutaneous 3-0 Monocryl and subcuticular 4-0 Vicryl. Dermabond was applied to both incisions. COMPLICATIONS: COMPLICATIONS None FINDINGS: After catheter placement, the tip lies at the cavo-atrial junction. The catheter aspirates normally and is ready  for immediate use. IMPRESSION: Placement of single lumen port a cath via right internal jugular vein. The catheter tip lies at the cavo-atrial junction. A power injectable port a cath was placed and is ready for immediate use. Electronically Signed   By: Aletta Edouard M.D.   On: 12/09/2017 14:37    Labs:  CBC: Recent Labs    12/07/17 0415 12/08/17 0342 12/09/17 0613 12/10/17 0438  WBC 4.0 5.8 5.5 6.0  HGB 8.0* 8.7* 9.0* 9.2*  HCT 26.0* 28.0* 29.1* 29.4*  PLT 123* 149* 204 222    COAGS: Recent Labs    11/08/17 2026 11/20/17 1500 12/03/17 0953 12/09/17 0613  INR 1.18 1.22 1.51 1.29    BMP: Recent Labs    12/07/17 0415 12/08/17 0342 12/09/17 0613 12/10/17 0438  NA 136 136 135 136  K 3.7 3.7 3.8 4.0  CL 109 109 106 104  CO2 22 21* 22 26  GLUCOSE 132* 137* 133* 127*  BUN 12 12 13 12   CALCIUM 8.1* 8.3* 8.4* 8.5*  CREATININE 0.66 0.46* 0.41* 0.48*  GFRNONAA >60 >60 >60 >60  GFRAA >60 >60 >60 >60    LIVER FUNCTION TESTS: Recent Labs    12/07/17 0415 12/08/17 0342 12/09/17 0613 12/10/17 0438  BILITOT 2.0* 1.8* 1.7* 2.0*  AST 28 29 31  59*  ALT 28 30 31  46*  ALKPHOS 225* 310* 374* 548*  PROT 6.3* 6.6 6.8 6.9  ALBUMIN 2.2* 2.3* 2.3* 2.3*    Assessment and Plan: Pt with hx cholangiocarcinoma of liver, primarily within the left lobe causing biliary obstruction. Status post biliary drain placement from a right lobe approach on 10/26/2017. Admitted with sepsis and now adequately treated for bacteremia. S/p port a cath placement and cholangiogram yesterday which showed good drainage throughout the right lobe bile ducts and extrahepatic bile ducts without evidence of significant obstruction. There is some degree of narrowing of central right intrahepatic bile ducts near the biliary confluence with left lobe ducts. However, there was not enough obstruction present to warrant internal stenting and stenting would also isolate left lobe bile ducts. The current right-sided  internal/external biliary drainage catheter was exchanged for a new 10 French catheter. This will be initially capped for a trial of internal drainage. 2. Should bilirubin levels rise, there may be a need for an additional separate percutaneous drainage procedure of left lobe bile ducts.  Pt afebrile; WBC nl; hgb 9.2(9.0), creat 0.48, t bili 2.0(1.7), other LFT's up slightly; f/u blood cx pend; f/u labs d/w Dr. Kathlene Cote today; he recommends keeping drain capped for now as sl rise in LFT's not unexpected; cont to flush drain with 5 cc sterile NS once daily; if pt develops persistent sig rise in LFT's, sig abd pain, n/v, fevers/chills, leaking at drain site rec attaching drain back to bag drainage-pt given bag; he will f/u with [name] and if additional intervention needed can discuss after her evaluation; pt also given IR phone numbers to call with any drain related questions.    Electronically Signed: D. Rowe Robert, PA-Lewis 12/10/2017, 11:02 AM   I spent a total of 20 minutes at the the patient's bedside AND on the patient's hospital floor or unit, greater than 50% of which was counseling/coordinating care for biliary drain    Patient ID: Matthew Lewis, male   DOB: [birth year], 62 y.o.   MRN: [id number]

## 2017-12-10 NOTE — Progress Notes (Signed)
Physical Therapy Treatment Patient Details Name: Matthew Lewis MRN: [id number] DOB: Aug 04, 1955 Today's Date: 12/10/2017    History of Present Illness 62 y.o. male with medical history significant for intrahepatic cholangiocarcinoma, metastatic to liver and lung, type 2 diabetes, prior history of alcohol abuse, cirrhosis, status post percutaneous biliary drainage drainage tube for biliary obstruction who presented on 12/03/2017 with several days of rigors, fevers, nausea and vomiting and generalized malaise.     PT Comments    Patient progressing to no device this session with hallway ambulation though occasionally used railing in hallway.  Descent down to first floor from fourth with railing (despite my objection) to allow him to go outside (I had encouraged elevator).  He is stable for d/c when medically ready and plans his own rehab without follow up PT needs.    Follow Up Recommendations  No PT follow up     Equipment Recommendations  None recommended by PT    Recommendations for Other Services       Precautions / Restrictions Precautions Precautions: Fall    Mobility  Bed Mobility                  Transfers Overall transfer level: Modified independent                  Ambulation/Gait Ambulation/Gait assistance: Supervision;Min guard Gait Distance (Feet): 800 Feet Assistive device: None(occasional use of wall rail) Gait Pattern/deviations: Step-through pattern;Shuffle;Decreased stride length     General Gait Details: without walker occasionally reaching for support on wall or rail in hallway; mostly S but occasional minguard for safety   Stairs Stairs: Yes Stairs assistance: Min guard Stair Management: Alternating pattern;One rail Right Number of Stairs: 40 General stair comments: descent only down stairwell 4th to 1st floor with minguard for safety and pt without LOB holding railing    Wheelchair Mobility    Modified Rankin (Stroke Patients  Only)       Balance Overall balance assessment: Mild deficits observed, not formally tested                                          Cognition Arousal/Alertness: Awake/alert Behavior During Therapy: WFL for tasks assessed/performed Overall Cognitive Status: Within Functional Limits for tasks assessed                                        Exercises      General Comments General comments (skin integrity, edema, etc.): some imbalance with ambulation without walker, but compensating without difficulty using railing if needed      Pertinent Vitals/Pain Faces Pain Scale: Hurts a little bit Pain Location: at incision R flank Pain Descriptors / Indicators: Operative site guarding Pain Intervention(s): Monitored during session;Repositioned    Home Living                      Prior Function            PT Goals (current goals can now be found in the care plan section) Progress towards PT goals: Progressing toward goals    Frequency    Min 3X/week      PT Plan Current plan remains appropriate    Co-evaluation  AM-PAC PT "6 Clicks" Mobility   Outcome Measure  Help needed turning from your back to your side while in a flat bed without using bedrails?: None Help needed moving from lying on your back to sitting on the side of a flat bed without using bedrails?: None Help needed moving to and from a bed to a chair (including a wheelchair)?: None Help needed standing up from a chair using your arms (e.g., wheelchair or bedside chair)?: None Help needed to walk in hospital room?: None Help needed climbing 3-5 steps with a railing? : None 6 Click Score: 24    End of Session Equipment Utilized During Treatment: Gait belt Activity Tolerance: Patient tolerated treatment well Patient left: with call bell/phone within reach;in bed   PT Visit Diagnosis: Difficulty in walking, not elsewhere classified (R26.2)      Time: 1445-1520 PT Time Calculation (min) (ACUTE ONLY): 35 min  Charges:  $Gait Training: 23-37 mins                     Matthew Lewis, Matthew Lewis [phone] 12/10/2017    Matthew Lewis 12/10/2017, 4:45 PM

## 2017-12-11 ENCOUNTER — Ambulatory Visit: Payer: Medicare Other

## 2017-12-11 ENCOUNTER — Telehealth: Payer: Self-pay

## 2017-12-11 ENCOUNTER — Other Ambulatory Visit: Payer: Medicare Other

## 2017-12-11 LAB — GLUCOSE, CAPILLARY
GLUCOSE-CAPILLARY: 94 mg/dL (ref 70–99)
Glucose-Capillary: 98 mg/dL (ref 70–99)

## 2017-12-11 MED ORDER — LIDOCAINE-PRILOCAINE 2.5-2.5 % EX CREA
TOPICAL_CREAM | CUTANEOUS | Status: DC | PRN
  Filled 2017-12-11 (×2): qty 5

## 2017-12-11 MED ORDER — HEPARIN SOD (PORK) LOCK FLUSH 100 UNIT/ML IV SOLN
500.0000 [IU] | INTRAVENOUS | Status: AC | PRN
  Administered 2017-12-11: 500 [IU]

## 2017-12-11 MED ORDER — OXYCODONE HCL 5 MG PO TABS: 5.0000 mg | ORAL_TABLET | ORAL | 0 refills | Status: DC | PRN

## 2017-12-11 MED ORDER — BOOST PLUS PO LIQD: 237.0000 mL | Freq: Three times a day (TID) | ORAL | 0 refills | Status: DC

## 2017-12-11 MED ORDER — LORATADINE 10 MG PO TABS: 10.0000 mg | ORAL_TABLET | Freq: Every day | ORAL | 0 refills | Status: DC

## 2017-12-11 MED ORDER — ERTAPENEM IV (FOR PTA / DISCHARGE USE ONLY): 1.0000 g | INTRAVENOUS | 0 refills | Status: DC

## 2017-12-11 MED ORDER — SODIUM CHLORIDE 0.9 % IV SOLN
1.0000 g | INTRAVENOUS | Status: DC
  Administered 2017-12-11: 1000 mg via INTRAVENOUS
  Filled 2017-12-11: qty 1

## 2017-12-11 NOTE — Telephone Encounter (Signed)
Spoke with patient to let him know that his infusion is scheduled for 8:15 on Thursday 12/12.  Patient verbalized an understanding.

## 2017-12-11 NOTE — Care Management Important Message (Signed)
Important Message  Patient Details  Name: CHAMPION CORALES MRN: [id number] Date of Birth: [birth year]   Medicare Important Message Given:  Yes    Kerin Salen 12/11/2017, 11:50 AMImportant Message  Patient Details  Name: NERY FRAPPIER MRN: [id number] Date of Birth: 1955/02/28   Medicare Important Message Given:  Yes    Kerin Salen 12/11/2017, 11:50 AM

## 2017-12-11 NOTE — Progress Notes (Signed)
Referring Physician(s): Fabio Asa Lavone Nian; Bonnielee Haff  Supervising Physician: Daryll Brod  Patient Status:  [facility] - In-pt  Chief Complaint: None  Subjective:  Cholangiocarcinoma of liver s/p biliary drain placement 10/26/2017 by Dr. Earleen Newport, exchanged 11/07/2017 by Dr. Annamaria Boots and again on 12/09/2017 by Dr. Kathlene Cote. Patient awake and alert sitting in bed with no complaints at this time. States that he is being discharged today. Biliary drain incision c/d/i and capped.   Allergies: Morphine and related  Medications: Prior to Admission medications   Medication Sig Start Date End Date Taking? Authorizing Provider  aspirin EC 81 MG tablet Take 81 mg by mouth daily.   Yes [provider]  calcium carbonate (TUMS - DOSED IN MG ELEMENTAL CALCIUM) 500 MG chewable tablet Chew 1 tablet by mouth 2 (two) times daily as needed for indigestion or heartburn.   Yes [provider]  celecoxib (CELEBREX) 200 MG capsule Take 200 mg by mouth at bedtime.  10/10/17  Yes [provider]  gabapentin (NEURONTIN) 400 MG capsule Take 400-800 mg by mouth See admin instructions. Take 400 mg by mouth in the morning and 800 mg in the evening   Yes [provider]  Multiple Vitamin (MULTIVITAMIN) tablet Take 1 tablet by mouth daily.   Yes [provider]  ondansetron (ZOFRAN) 8 MG tablet Take 1 tablet (8 mg total) by mouth 2 (two) times daily as needed. Start on the third day after chemotherapy. 11/06/17  Yes [name], MD  polyvinyl alcohol (LIQUIFILM TEARS) 1.4 % ophthalmic solution Place 1 drop into both eyes daily as needed for dry eyes.    Yes [provider]  prochlorperazine (COMPAZINE) 10 MG tablet Take 1 tablet (10 mg total) by mouth every 6 (six) hours as needed (Nausea or vomiting). 11/06/17  Yes [name], MD  ertapenem [facility]) IVPB Inject 1 g into the vein daily for 8 days. Indication: ESBL E. Coli bacteremia Last Day of Therapy:   12/18/17 Labs - Once weekly:  CBC/D and BMP, Labs - Every other week:  ESR and CRP 12/11/17 12/19/17  Regalado, Belkys A, MD  lactose free nutrition (BOOST PLUS) LIQD Take 237 mLs by mouth 3 (three) times daily with meals. 12/11/17   Regalado, Belkys A, MD  loratadine (CLARITIN) 10 MG tablet Take 1 tablet (10 mg total) by mouth daily. 12/11/17   Regalado, Belkys A, MD  oxyCODONE (OXY IR/ROXICODONE) 5 MG immediate release tablet Take 1 tablet (5 mg total) by mouth every 4 (four) hours as needed (severe pain). 12/11/17   Regalado, Jerald Kief A, MD     Vital Signs: BP 108/76 (BP Location: Left Arm)   Pulse 75   Temp 98.8 F (37.1 C) (Oral)   Resp 20   Ht '5\' 11"'  (1.803 m)   Wt 208 lb 1.8 oz (94.4 kg)   SpO2 97%   BMI 29.03 kg/m   Physical Exam  Constitutional: He is oriented to person, place, and time. He appears well-developed and well-nourished. No distress.  Pulmonary/Chest: Effort normal. No respiratory distress.  Abdominal:  Biliary drain incision without erythema, drainage, or tenderness; drain capped without evidence of leaking; drain flushes/asiprates without resistance.  Neurological: He is alert and oriented to person, place, and time.  Skin: Skin is warm and dry.  Psychiatric: He has a normal mood and affect. His behavior is normal. Judgment and thought content normal.  Nursing note and vitals reviewed.   Imaging: Ir Exchange Biliary Drain  Result Date: 12/09/2017 INDICATION:  Cholangiocarcinoma of liver, primarily within the left lobe causing biliary obstruction. Status post biliary drain placement from a right lobe approach on 10/26/2017. Admitted with sepsis and now adequately treated for bacteremia. Assessment of bile ducts performed to determine need for internal biliary stenting and whether the external drain can be removed. EXAM: EXCHANGE OF BILIARY DRAINAGE CATHETER WITH CHOLANGIOGRAM MEDICATIONS: 1 g IV meropenem; The antibiotic was administered within an appropriate time  frame prior to the initiation of the procedure. ANESTHESIA/SEDATION: Moderate (conscious) sedation was employed during this procedure. A total of Versed 3.0 mg and Fentanyl 100 mcg was administered intravenously. Moderate Sedation Time: 23 minutes. The patient's level of consciousness and vital signs were monitored continuously by radiology nursing throughout the procedure under my direct supervision. FLUOROSCOPY TIME:  Fluoroscopy Time: 3 minutes and 18 seconds. 84.8 mGy. COMPLICATIONS: None immediate. PROCEDURE: Informed written consent was obtained from the patient after a thorough discussion of the procedural risks, benefits and alternatives. All questions were addressed. Maximal Sterile Barrier Technique was utilized including caps, mask, sterile gowns, sterile gloves, sterile drape, hand hygiene and skin antiseptic. A timeout was performed prior to the initiation of the procedure. Initially, the indwelling 10 [place] biliary drainage catheter was removed over a guidewire. A 7 French sheath was advanced into the right lobe of the liver and a cholangiogram performed with visualization of biliary drainage in the right lobe as well as extrahepatic bile ducts. The sheath positioning was altered and a 5 [place] catheter also advanced parallel to the guidewire in attempting opacification of left lobe bile ducts. A new 10 French internal/external biliary drainage catheter was then advanced over a guidewire. The distal portion was formed in the duodenum. Catheter positioning was confirmed by fluoroscopy after contrast injection. The drainage catheter was flushed with saline and capped. It was secured at the skin with a Prolene retention suture. FINDINGS: Initial cholangiogram shows fairly good drainage throughout the right lobe bile ducts and extrahepatic bile ducts without evidence of significant obstruction. There is some mild narrowing of central right intrahepatic ducts near the confluence with left lobe ducts. The  common bile duct is normally patent. The cystic duct is patent with reflux of contrast noted into the gallbladder. Contrast enters the duodenum normally. Left lobe ducts appear isolated and likely obstructed centrally with no ability to opacified left lobe bile ducts by right-sided cholangiogram. Injection of a 5 French catheter introduced into central bile ducts could not opacify left lobe ducts. It was evident that there is currently not a need for right-sided permanent indwelling biliary stenting. In addition, placement of a central right-sided biliary stent would completely isolate any existing drainage of left-sided bile ducts. For this reason, the biliary drain was exchanged and will be left capped for a trial of internal drainage. Should bilirubin levels rise, there may be a need for separate percutaneous drainage of left-sided bile ducts at a different setting. IMPRESSION: 1. Cholangiogram shows good drainage throughout the right lobe bile ducts and extrahepatic bile ducts without evidence of significant obstruction. There is some degree of narrowing of central right intrahepatic bile ducts near the biliary confluence with left lobe ducts. However, there was not enough obstruction present to warrant internal stenting and stenting would also isolate left lobe bile ducts. The current right-sided internal/external biliary drainage catheter was exchanged for a new 10 French catheter. This will be initially capped for a trial of internal drainage. 2. Should bilirubin levels rise, there may be a need for an additional separate percutaneous  drainage procedure of left lobe bile ducts. Electronically Signed   By: Aletta Edouard M.D.   On: 12/09/2017 13:39   Ir Imaging Guided Port Insertion  Result Date: 12/09/2017 CLINICAL DATA:  Cholangiocarcinoma and need for porta cath for chemotherapy. EXAM: IMPLANTED PORT A CATH PLACEMENT WITH ULTRASOUND AND FLUOROSCOPIC GUIDANCE ANESTHESIA/SEDATION: 3.0 mg IV Versed; 100  mcg IV Fentanyl Total Moderate Sedation Time:  37 minutes The patient's level of consciousness and physiologic status were continuously monitored during the procedure by Radiology nursing. Additional Medications: A scheduled dose 1 g IV meropenem with given just prior to the procedure. FLUOROSCOPY TIME:  42 seconds.  16.7 mGy. PROCEDURE: The procedure, risks, benefits, and alternatives were explained to the patient. Questions regarding the procedure were encouraged and answered. The patient understands and consents to the procedure. A time-out was performed prior to initiating the procedure. Ultrasound was utilized to confirm patency of the right internal jugular vein. The right neck and chest were prepped with chlorhexidine in a sterile fashion, and a sterile drape was applied covering the operative field. Maximum barrier sterile technique with sterile gowns and gloves were used for the procedure. Local anesthesia was provided with 1% lidocaine. After creating a small venotomy incision, a 21 gauge needle was advanced into the right internal jugular vein under direct, real-time ultrasound guidance. Ultrasound image documentation was performed. After securing guidewire access, an 8 Fr dilator was placed. A J-wire was kinked to measure appropriate catheter length. A subcutaneous port pocket was then created along the upper chest wall utilizing sharp and blunt dissection. Portable cautery was utilized. The pocket was irrigated with sterile saline. A single lumen power injectable port was chosen for placement. The 8 Fr catheter was tunneled from the port pocket site to the venotomy incision. The port was placed in the pocket. External catheter was trimmed to appropriate length based on guidewire measurement. At the venotomy, an 8 Fr peel-away sheath was placed over a guidewire. The catheter was then placed through the sheath and the sheath removed. Final catheter positioning was confirmed and documented with a  fluoroscopic spot image. The port was accessed with a needle and aspirated and flushed with heparinized saline. The access needle was removed. The venotomy and port pocket incisions were closed with subcutaneous 3-0 Monocryl and subcuticular 4-0 Vicryl. Dermabond was applied to both incisions. COMPLICATIONS: COMPLICATIONS None FINDINGS: After catheter placement, the tip lies at the cavo-atrial junction. The catheter aspirates normally and is ready for immediate use. IMPRESSION: Placement of single lumen port a cath via right internal jugular vein. The catheter tip lies at the cavo-atrial junction. A power injectable port a cath was placed and is ready for immediate use. Electronically Signed   By: Aletta Edouard M.D.   On: 12/09/2017 14:37    Labs:  CBC: Recent Labs    12/07/17 0415 12/08/17 0342 12/09/17 0613 12/10/17 0438  WBC 4.0 5.8 5.5 6.0  HGB 8.0* 8.7* 9.0* 9.2*  HCT 26.0* 28.0* 29.1* 29.4*  PLT 123* 149* 204 222    COAGS: Recent Labs    11/08/17 2026 11/20/17 1500 12/03/17 0953 12/09/17 0613  INR 1.18 1.22 1.51 1.29    BMP: Recent Labs    12/07/17 0415 12/08/17 0342 12/09/17 0613 12/10/17 0438  NA 136 136 135 136  K 3.7 3.7 3.8 4.0  CL 109 109 106 104  CO2 22 21* 22 26  GLUCOSE 132* 137* 133* 127*  BUN '12 12 13 12  ' CALCIUM 8.1* 8.3* 8.4* 8.5*  CREATININE 0.66 0.46* 0.41* 0.48*  GFRNONAA >60 >60 >60 >60  GFRAA >60 >60 >60 >60    LIVER FUNCTION TESTS: Recent Labs    12/07/17 0415 12/08/17 0342 12/09/17 0613 12/10/17 0438  BILITOT 2.0* 1.8* 1.7* 2.0*  AST '28 29 31 ' 59*  ALT '28 30 31 ' 46*  ALKPHOS 225* 310* 374* 548*  PROT 6.3* 6.6 6.8 6.9  ALBUMIN 2.2* 2.3* 2.3* 2.3*    Assessment and Plan:  Cholangiocarcinoma of liver s/p biliary drain placement 10/26/2017 by Dr. Earleen Newport, exchanged 11/07/2017 by Dr. Annamaria Boots and again on 12/09/2017 by Dr. Kathlene Cote. Biliary drain stable and capped. Continue with daily flushes on outpatient basis. Please call IR with  questions/concerns.   Electronically Signed: Earley Abide, PA-C 12/11/2017, 1:57 PM   I spent a total of 25 Minutes at the the patient's bedside AND on the patient's hospital floor or unit, greater than 50% of which was counseling/coordinating care for cholangiocarcinoma of liver s/p biliary drain placement.

## 2017-12-11 NOTE — Consult Note (Signed)
   [facility] CM Inpatient Consult   12/11/2017  Matthew Lewis February 21, 1955 [id number]   Patient screened for potential [facility] Care Management services due to unplanned readmission risk score of 33% (extreme) and multiple hospitalizations.  Went to bedside to speak with Matthew Lewis, prior to hospitalization, to explain and offer [facility] Care Management services.  Matthew Lewis pleasantly declines [facility] Care Management. He denies having any transportation, medication or disease management needs. States he will have Zachary and "that is enough."  Provided [facility] Care Management brochure with contact information to call should he change his mind.   Marthenia Rolling, MSN-Ed, RN,BSN [facility] Liaison [phone]

## 2017-12-11 NOTE — Telephone Encounter (Signed)
-----   Message from Gardiner Rhyme, RN sent at 12/11/2017  1:47 PM EST ----- [name], I have him scheduled for 8:15 on Thursday 12/12.  Thanks!  Malachy Mood, Could you call to let him know? Melanie ----- Message ----- From: [name], MD Sent: 12/10/2017   4:03 PM EST To: Jonnie Finner, RN, Gardiner Rhyme, RN  I have just request cisplatin and gemcitabine Thursday or Friday next week, hope we have infusion slot open, let me know if not  Thanks   Krista Blue

## 2017-12-11 NOTE — Progress Notes (Signed)
Upon assessing patient this morning, RN noticed that patient was not connected to his PAC and the IV pump was still on and slowly dripping onto the floor. Writer asked patient how long it's been since his night nurse disconnected him from the fluids and he said "Oh I did that a little over an hour ago." Explained to the patient the risks of disconnecting on his own and that the [facility] needs to be flushed with heparin after disconnecting. Patient was very apologetic and said he didn't know. RN paged the IV team to assess PAC. Rn explained to the patient that the IV team will come check the [facility] to ensure it is working properly since patient informed me that he was going to be discharged today and will be receiving IV abx daily at home. Will continue to monitor.

## 2017-12-11 NOTE — Discharge Summary (Signed)
Physician Discharge Summary  Matthew Lewis:[id number] DOB: 14-Feb-1955 DOA: 12/03/2017  PCP: [name], MD  Admit date: 12/03/2017 Discharge date: 12/11/2017  Admitted From: Home  Disposition:  Home   Recommendations for Outpatient Follow-up:  1. Follow up with PCP in 1-2 weeks 2. Please obtain BMP/CBC in one week 3. Needs LFT, and follow up with Dr Annamaria Boots and IR>   Home Health: yes.   Discharge Condition: stable.  CODE STATUS:  Diet recommendation: Heart Healthy / Carb Modified / Regular / Dysphagia   Brief/Interim Summary: 62 year old with past medical history significant for intrahepatic cholangiocarcinoma metastatic to liver and lung, type 2 diabetes prior history of alcohol abuse, cirrhosis status post percutaneous biliary drainage tube for biliary obstruction who presented on 12/03/2017 with several days of Rigors , fevers, nausea vomiting.  11-26 patient was seen by IR for biliary drain flushing, he also was seen by his oncologist that day.  Referred for admission due to worsening abdominal pain, vomiting.    Patient was found to have septic shock secondary to ESBL E. coli bacteremia with lactic acidosis.    1-shock secondary to ESBL, E. coli bacteremia lactic acidosis Most likely biliary source considering his previous history and presence of biliary drain. CT scan of the abdomen showed that the drain was in good position Patient was evaluated by critical care service, he had a central line, he was a started on IV pressors.  Course of hospitalization sepsis has resolved. Was a started on meropenem for E. coli ESBL bacteremia. Patient was seen by interventional radiology, since the bile duct are open with only mild intrahepatic narrowing and good flow there was no need to do any stent placement. His biliary drain was internalized. It was discussed with ID recommendation is for total 14 days of IV antibiotics.  He  have 5 more days of meropenem. Repeated  blood culture 12-04 no growth to date.   History of cholangiocarcinoma with metastasis with bilateral drain: Patient was evaluated by medical oncology and interventional radiology. His alkaline phosphatase and liver function tests were up today. He ask IR to follow-up on patient. They recommend to keep the drain clamped.  Is suspected for liver function test to increase.  Patient developed nausea vomiting worsening abdominal pain they instructed the patient to open the drain.  He will need close follow-up and liver function tests with his oncology. Will stop also Lipitor due to increase in liver enzymes.   Mild hyponatremia due to hypovolemia improved.  Normocytic anemia; Stable monitor.  Mild AKI resolved.  Diabetes mellitus type 2 with hyperglycemia/peripheral neuropathy HbA1c 4.9 on November 3. Patient has had a few hypoglycemic episodes in the hospital which could be due to sepsis. He is on a regular diet. CBGs are normal now.  Hold oral hypoglycemic agents.  He has not required insulin coverage. Hold insulin at discharge   Essential hypertension Blood pressures have improved. Continue to hold lisinopril.  Moderate Malnutrition;  On supplement.    Discharge Diagnoses:  Active Problems:   Hypertension   Hyponatremia   Alcoholic cirrhosis of liver without ascites (HCC)   Biliary obstruction due to malignant neoplasm (HCC)   Cirrhosis (HCC)   Intrahepatic cholangiocarcinoma (HCC)   Hypotension   Acute renal failure (ARF) (HCC)   Severe sepsis with acute organ dysfunction due to Gram negative bacteria ([place])   Septic shock (HCC)   Cholangiocarcinoma ([place])   Sepsis due to gram-negative bacteria [facility])    Discharge Instructions  Discharge Instructions  Diet - low sodium heart healthy   Complete by:  As directed    Home infusion instructions [facility] May follow [place] Dosing Protocol; May administer Cathflo as needed to maintain patency of  vascular access device.; Flushing of vascular access device: per [facility] Protocol: 0.9% NaCl pre/post medica...   Complete by:  As directed    Instructions:  May follow [place] Dosing Protocol   Instructions:  May administer Cathflo as needed to maintain patency of vascular access device.   Instructions:  Flushing of vascular access device: per [facility] Protocol: 0.9% NaCl pre/post medication administration and prn patency; Heparin 100 u/ml, 17m for implanted ports and Heparin 10u/ml, 520mfor all other central venous catheters.   Instructions:  May follow [facility] Anaphylaxis Protocol for First Dose Administration in the home: 0.9% NaCl at 25-50 ml/hr to maintain IV access for protocol meds. Epinephrine 0.3 ml IV/IM PRN and Benadryl 25-50 IV/IM PRN s/s of anaphylaxis.   Instructions:  [facility]fusion Coordinator (RN) to assist per patient IV care needs in the home PRN.   Increase activity slowly   Complete by:  As directed      Allergies as of 12/11/2017      Reactions   Morphine And Related Other (See Comments)   Patient is recovering from drug addiction and wants to avoid any narcotics      Medication List    STOP taking these medications   atorvastatin 40 MG tablet Commonly known as:  LIPITOR   glipiZIDE 5 MG tablet Commonly known as:  GLUCOTROL   JARDIANCE 10 MG Tabs tablet Generic drug:  empagliflozin   LEVEMIR FLEXPEN 100 UNIT/ML Pen Generic drug:  Insulin Detemir   lisinopril 20 MG tablet Commonly known as:  PRINIVIL,ZESTRIL   metFORMIN 1000 MG tablet Commonly known as:  GLUCOPHAGE     TAKE these medications   aspirin EC 81 MG tablet Take 81 mg by mouth daily.   calcium carbonate 500 MG chewable tablet Commonly known as:  TUMS - dosed in mg elemental calcium Chew 1 tablet by mouth 2 (two) times daily as needed for indigestion or heartburn.   celecoxib 200 MG capsule Commonly known as:  CELEBREX Take 200 mg by mouth at bedtime.   ertapenem  IVPB Commonly  known as:  INVANZ Inject 1 g into the vein daily for 8 days. Indication: ESBL E. Coli bacteremia Last Day of Therapy:  12/18/17 Labs - Once weekly:  CBC/D and BMP, Labs - Every other week:  ESR and CRP   gabapentin 400 MG capsule Commonly known as:  NEURONTIN Take 400-800 mg by mouth See admin instructions. Take 400 mg by mouth in the morning and 800 mg in the evening   lactose free nutrition Liqd Take 237 mLs by mouth 3 (three) times daily with meals.   loratadine 10 MG tablet Commonly known as:  CLARITIN Take 1 tablet (10 mg total) by mouth daily.   multivitamin tablet Take 1 tablet by mouth daily.   ondansetron 8 MG tablet Commonly known as:  ZOFRAN Take 1 tablet (8 mg total) by mouth 2 (two) times daily as needed. Start on the third day after chemotherapy.   oxyCODONE 5 MG immediate release tablet Commonly known as:  Oxy IR/ROXICODONE Take 1 tablet (5 mg total) by mouth every 4 (four) hours as needed (severe pain). What changed:    medication strength  how much to take  reasons to take this   polyvinyl alcohol  1.4 % ophthalmic solution Commonly known as:  LIQUIFILM TEARS Place 1 drop into both eyes daily as needed for dry eyes.   prochlorperazine 10 MG tablet Commonly known as:  COMPAZINE Take 1 tablet (10 mg total) by mouth every 6 (six) hours as needed (Nausea or vomiting).            Home Infusion Instuctions  (From admission, onward)         Start     Ordered   12/11/17 0000  Home infusion instructions [facility] May follow [place] Dosing Protocol; May administer Cathflo as needed to maintain patency of vascular access device.; Flushing of vascular access device: per [facility] Protocol: 0.9% NaCl pre/post medica...    Question Answer Comment  Instructions May follow [place] Dosing Protocol   Instructions May administer Cathflo as needed to maintain patency of vascular access device.   Instructions Flushing of vascular access device: per  [facility] Protocol: 0.9% NaCl pre/post medication administration and prn patency; Heparin 100 u/ml, 71m for implanted ports and Heparin 10u/ml, 586mfor all other central venous catheters.   Instructions May follow [facility] Anaphylaxis Protocol for First Dose Administration in the home: 0.9% NaCl at 25-50 ml/hr to maintain IV access for protocol meds. Epinephrine 0.3 ml IV/IM PRN and Benadryl 25-50 IV/IM PRN s/s of anaphylaxis.   Instructions [facility] Infusion Coordinator (RN) to assist per patient IV care needs in the home PRN.      12/11/17 0901         Follow-up Information    [facility], [facility]-Home Follow up.   Specialty:  Home Health Services Why:  HH iv abx infusion-meds/supplies;HH nursing-instruction. Contact information: [phone]        Allergies  Allergen Reactions  . Morphine And Related Other (See Comments)    Patient is recovering from drug addiction and wants to avoid any narcotics    Consultations:  Oncology  ID  IR   Procedures/Studies: Dg Chest 2 View  Result Date: 12/03/2017 CLINICAL DATA:  Fever.  Sepsis evaluation. EXAM: CHEST - 2 VIEW COMPARISON:  11/20/2017 FINDINGS: Patchy airspace disease noted posteriorly on the lateral at the lung base. I suspect this is likely within the right lower lobe and concerning for pneumonia. Heart is normal size. No effusions. No acute bony abnormality. IMPRESSION: Patchy posterior basilar opacity best seen on the lateral view, likely in the right lung base concerning for right lower lobe pneumonia. Electronically Signed   By: KeRolm Baptise.D.   On: 12/03/2017 10:23   Ct Abdomen Pelvis W Contrast  Result Date: 12/03/2017 CLINICAL DATA:  [age]/o M; history of intrahepatic cholangiocarcinoma presenting with sepsis from gram-negative bacteremia. Suspected biliary infection. Biliary drain in situ. EXAM: CT ABDOMEN AND PELVIS WITH CONTRAST TECHNIQUE: Multidetector CT  imaging of the abdomen and pelvis was performed using the standard protocol following bolus administration of intravenous contrast. CONTRAST:  10081mMNIPAQUE IOHEXOL 300 MG/ML  SOLN COMPARISON:  11/08/2017 CT abdomen and pelvis. FINDINGS: Lower chest: Stable round lung base mass spanning approximately 5.1 x 3.8 cm in left lung base 2.8 cm pulmonary nodule. Hepatobiliary: Liver cirrhosis. Internal external biliary drain is present traversing the right lobe of the liver, common bile duct, with pigtail in the second segment of the duodenum is stable in position from the prior CT of abdomen and pelvis. No associated rim enhancing abscess identified. Mild interval increase in perihepatic ascites. Stable ill-defined  mass within the left liver hilum measuring approximately 5.1 x 2.2 cm and multiple additional hepatic metastases measuring up to 3.7 cm. Stable intrahepatic biliary ductal dilatation throughout the left lobe of liver. 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 Pancreas: Unremarkable. No pancreatic ductal dilatation or surrounding inflammatory changes. Spleen: Normal in size without focal abnormality. Adrenals/Urinary Tract: Stable bilateral adrenal metastasis. Left kidney lower pole 3 mm nonobstructing kidney stone is stable. No focal kidney lesion. No hydronephrosis. Normal bladder. Stomach/Bowel: No obstructive or inflammatory changes of the stomach, small bowel, or large bowel identified. Stable partial colectomy with patent anastomoses. Vascular/Lymphatic: Calcific aortic atherosclerosis. Stable upper abdominal retroperitoneal, periportal, and gastrohepatic lymphadenopathy. Reproductive: Prostate calcifications. Other: Postsurgical changes within the anterior abdominal wall with scarring from prior surgery. No hernia. Stable left lower abdominal mesenteric mass measuring 3.5 x 2.2 cm. Musculoskeletal: No fracture is seen. Stable chronic L5 pars defects and grade 1 L5-S1  anterolisthesis. Stable spondylosis of the spine with disc and facet degenerative changes greatest at L4-5 and L5-S1. IMPRESSION: 1. Stable position of internal external biliary drain. Stable intrahepatic biliary ductal dilatation of the left lobe. Stable left liver hilar mass and multiple hepatic metastases. No abscess identified. 2. Mild increase in upper abdominal ascites. 3. Stable pulmonary, adrenal, mesenteric, and retroperitoneal lymph node metastasis. Electronically Signed   By: Kristine Garbe M.D.   On: 12/03/2017 18:05   Dg Chest Port 1 View  Result Date: 12/03/2017 CLINICAL DATA:  Encounter for central line placement. EXAM: PORTABLE CHEST 1 VIEW 2:55 p.m. COMPARISON:  Chest x-ray dated 12/03/2017 at 10:16 a.m. FINDINGS: New right jugular vein catheter tip is at the cavoatrial junction 4.8 cm below the carina. No pneumothorax. Heart size and vascularity are normal. 2.5 cm mass at the left lung base posteriorly is barely visible on this radiograph. The ill-defined mass at the right lung base is poorly seen on this radiograph. IMPRESSION: Central venous catheter appears in good position. No acute abnormality. Faintly visible masses at both lung bases as previously demonstrated. Electronically Signed   By: Lorriane Shire M.D.   On: 12/03/2017 15:20   Dg Chest Port 1 View  Result Date: 11/20/2017 CLINICAL DATA:  62 y/o M; metastatic right adrenal gland cancer. Weakness with onset yesterday and fall. EXAM: PORTABLE CHEST 1 VIEW COMPARISON:  11/08/2017 chest radiograph. FINDINGS: The heart size and mediastinal contours are within normal limits. Both lungs are clear. ACDF hardware partially visualized. No acute osseous abnormality is evident. IMPRESSION: No active disease. Electronically Signed   By: Kristine Garbe M.D.   On: 11/20/2017 16:00   Ir Patient Eval Tech 0-60 Mins  Result Date: 12/02/2017 Chipper [place]     12/02/2017 12:46 PM Patient came in today with complaint of  biliary tube not draining.  I evaluated the tube.  It flushed easily.  The patient stated that he was unable to aspirate bile.  I placed a new drain bag to gravity and got minimal bloody bile bile draining into the bag.  I discussed this with Dr Barbie Banner.  I explained to the patient that he had an internal/ external biliary drain catheter so it was draining internal which is the desired goal. .  It flushed easily and the statlock and sutures were intact at the original position.  No images were requested.  The patient was advised to call us if any further problems arise.  Otherwise , he will return for a routine exchange at his scheduled interval.  Ir Exchange Biliary Drain  Result Date: 12/09/2017 INDICATION: Cholangiocarcinoma of liver, primarily within the left lobe causing biliary obstruction. Status post biliary drain placement from a right lobe approach on 10/26/2017. Admitted with sepsis and now adequately treated for bacteremia. Assessment of bile ducts performed to determine need for internal biliary stenting and whether the external drain can be removed. EXAM: EXCHANGE OF BILIARY DRAINAGE CATHETER WITH CHOLANGIOGRAM MEDICATIONS: 1 g IV meropenem; The antibiotic was administered within an appropriate time frame prior to the initiation of the procedure. ANESTHESIA/SEDATION: Moderate (conscious) sedation was employed during this procedure. A total of Versed 3.0 mg and Fentanyl 100 mcg was administered intravenously. Moderate Sedation Time: 23 minutes. The patient's level of consciousness and vital signs were monitored continuously by radiology nursing throughout the procedure under my direct supervision. FLUOROSCOPY TIME:  Fluoroscopy Time: 3 minutes and 18 seconds. 84.8 mGy. COMPLICATIONS: None immediate. PROCEDURE: Informed written consent was obtained from the patient after a thorough discussion of the procedural risks, benefits and alternatives. All questions were addressed. Maximal Sterile Barrier  Technique was utilized including caps, mask, sterile gowns, sterile gloves, sterile drape, hand hygiene and skin antiseptic. A timeout was performed prior to the initiation of the procedure. Initially, the indwelling 10 [place] biliary drainage catheter was removed over a guidewire. A 7 French sheath was advanced into the right lobe of the liver and a cholangiogram performed with visualization of biliary drainage in the right lobe as well as extrahepatic bile ducts. The sheath positioning was altered and a 5 [place] catheter also advanced parallel to the guidewire in attempting opacification of left lobe bile ducts. A new 10 French internal/external biliary drainage catheter was then advanced over a guidewire. The distal portion was formed in the duodenum. Catheter positioning was confirmed by fluoroscopy after contrast injection. The drainage catheter was flushed with saline and capped. It was secured at the skin with a Prolene retention suture. FINDINGS: Initial cholangiogram shows fairly good drainage throughout the right lobe bile ducts and extrahepatic bile ducts without evidence of significant obstruction. There is some mild narrowing of central right intrahepatic ducts near the confluence with left lobe ducts. The common bile duct is normally patent. The cystic duct is patent with reflux of contrast noted into the gallbladder. Contrast enters the duodenum normally. Left lobe ducts appear isolated and likely obstructed centrally with no ability to opacified left lobe bile ducts by right-sided cholangiogram. Injection of a 5 French catheter introduced into central bile ducts could not opacify left lobe ducts. It was evident that there is currently not a need for right-sided permanent indwelling biliary stenting. In addition, placement of a central right-sided biliary stent would completely isolate any existing drainage of left-sided bile ducts. For this reason, the biliary drain was exchanged and will be left  capped for a trial of internal drainage. Should bilirubin levels rise, there may be a need for separate percutaneous drainage of left-sided bile ducts at a different setting. IMPRESSION: 1. Cholangiogram shows good drainage throughout the right lobe bile ducts and extrahepatic bile ducts without evidence of significant obstruction. There is some degree of narrowing of central right intrahepatic bile ducts near the biliary confluence with left lobe ducts. However, there was not enough obstruction present to warrant internal stenting and stenting would also isolate left lobe bile ducts. The current right-sided internal/external biliary drainage catheter was exchanged for a new 10 French catheter. This will be initially capped for a trial of internal drainage. 2. Should bilirubin levels rise, there  may be a need for an additional separate percutaneous drainage procedure of left lobe bile ducts. Electronically Signed   By: Aletta Edouard M.D.   On: 12/09/2017 13:39   Ir Imaging Guided Port Insertion  Result Date: 12/09/2017 CLINICAL DATA:  Cholangiocarcinoma and need for porta cath for chemotherapy. EXAM: IMPLANTED PORT A CATH PLACEMENT WITH ULTRASOUND AND FLUOROSCOPIC GUIDANCE ANESTHESIA/SEDATION: 3.0 mg IV Versed; 100 mcg IV Fentanyl Total Moderate Sedation Time:  37 minutes The patient's level of consciousness and physiologic status were continuously monitored during the procedure by Radiology nursing. Additional Medications: A scheduled dose 1 g IV meropenem with given just prior to the procedure. FLUOROSCOPY TIME:  42 seconds.  16.7 mGy. PROCEDURE: The procedure, risks, benefits, and alternatives were explained to the patient. Questions regarding the procedure were encouraged and answered. The patient understands and consents to the procedure. A time-out was performed prior to initiating the procedure. Ultrasound was utilized to confirm patency of the right internal jugular vein. The right neck and chest were  prepped with chlorhexidine in a sterile fashion, and a sterile drape was applied covering the operative field. Maximum barrier sterile technique with sterile gowns and gloves were used for the procedure. Local anesthesia was provided with 1% lidocaine. After creating a small venotomy incision, a 21 gauge needle was advanced into the right internal jugular vein under direct, real-time ultrasound guidance. Ultrasound image documentation was performed. After securing guidewire access, an 8 Fr dilator was placed. A J-wire was kinked to measure appropriate catheter length. A subcutaneous port pocket was then created along the upper chest wall utilizing sharp and blunt dissection. Portable cautery was utilized. The pocket was irrigated with sterile saline. A single lumen power injectable port was chosen for placement. The 8 Fr catheter was tunneled from the port pocket site to the venotomy incision. The port was placed in the pocket. External catheter was trimmed to appropriate length based on guidewire measurement. At the venotomy, an 8 Fr peel-away sheath was placed over a guidewire. The catheter was then placed through the sheath and the sheath removed. Final catheter positioning was confirmed and documented with a fluoroscopic spot image. The port was accessed with a needle and aspirated and flushed with heparinized saline. The access needle was removed. The venotomy and port pocket incisions were closed with subcutaneous 3-0 Monocryl and subcuticular 4-0 Vicryl. Dermabond was applied to both incisions. COMPLICATIONS: COMPLICATIONS None FINDINGS: After catheter placement, the tip lies at the cavo-atrial junction. The catheter aspirates normally and is ready for immediate use. IMPRESSION: Placement of single lumen port a cath via right internal jugular vein. The catheter tip lies at the cavo-atrial junction. A power injectable port a cath was placed and is ready for immediate use. Electronically Signed   By: Aletta Edouard M.D.   On: 12/09/2017 14:37      Subjective: He is feeling well, denies nausea, vomiting.   Discharge Exam: Vitals:   12/10/17 2123 12/11/17 0604  BP: 103/67 108/76  Pulse: 75   Resp: (!) 1 20  Temp: 98.1 F (36.7 C) 98.8 F (37.1 C)  SpO2: 97%    Vitals:   12/10/17 1247 12/10/17 2123 12/11/17 0500 12/11/17 0604  BP: 96/68 103/67  108/76  Pulse: 98 75    Resp:  (!) 1  20  Temp: 98.3 F (36.8 C) 98.1 F (36.7 C)  98.8 F (37.1 C)  TempSrc: Oral Oral  Oral  SpO2: 97% 97%    Weight:   94.4 kg  Height:        General: Pt is alert, awake, not in acute distress, icteric  Cardiovascular: RRR, S1/S2 +, no rubs, no gallops Respiratory: CTA bilaterally, no wheezing, no rhonchi Abdominal: Soft, NT, distended.  Extremities: no edema, no cyanosis    The results of significant diagnostics from this hospitalization (including imaging, microbiology, ancillary and laboratory) are listed below for reference.     Microbiology: Recent Results (from the past 240 hour(s))  Culture, Blood     Status: None   Collection Time: 12/02/17 12:51 PM  Result Value Ref Range Status   Specimen Description BLOOD LEFT HAND  Final   Special Requests   Final    BOTTLES DRAWN AEROBIC AND ANAEROBIC Blood Culture adequate volume   Culture   Final    NO GROWTH 5 DAYS Performed at [facility], [address], [place], [place] [address]    Report Status 12/07/2017 FINAL  Final  Culture, Blood     Status: Abnormal   Collection Time: 12/02/17 12:51 PM  Result Value Ref Range Status   Specimen Description BLOOD RIGHT HAND  Final   Special Requests   Final    BOTTLES DRAWN AEROBIC AND ANAEROBIC Blood Culture adequate volume   Culture  Setup Time   Final    GRAM NEGATIVE RODS ANAEROBIC BOTTLE ONLY Organism ID to follow    Culture (A)  Final    ESCHERICHIA COLI SUSCEPTIBILITIES PERFORMED ON PREVIOUS CULTURE WITHIN THE LAST 5 DAYS. Performed at [facility], [place]  [address] [place], [place] [address]    Report Status 12/05/2017 FINAL  Final  Blood Culture ID Panel (Reflexed)     Status: Abnormal   Collection Time: 12/02/17 12:51 PM  Result Value Ref Range Status   Enterococcus species NOT DETECTED NOT DETECTED Final   Listeria monocytogenes NOT DETECTED NOT DETECTED Final   Staphylococcus species NOT DETECTED NOT DETECTED Final   Staphylococcus aureus (BCID) NOT DETECTED NOT DETECTED Final   Streptococcus species NOT DETECTED NOT DETECTED Final   Streptococcus agalactiae NOT DETECTED NOT DETECTED Final   Streptococcus pneumoniae NOT DETECTED NOT DETECTED Final   Streptococcus pyogenes NOT DETECTED NOT DETECTED Final   Acinetobacter baumannii NOT DETECTED NOT DETECTED Final   Enterobacteriaceae species DETECTED (A) NOT DETECTED Final    Comment: Enterobacteriaceae represent a large family of gram-negative bacteria, not a single organism. CRITICAL RESULT CALLED TO, READ BACK BY AND VERIFIED WITH: E.WILLIAMSON,PHARMD AT 2145 ON 12/03/17 BY G.MCADOO    Enterobacter cloacae complex NOT DETECTED NOT DETECTED Final   Escherichia coli DETECTED (A) NOT DETECTED Final    Comment: CRITICAL RESULT CALLED TO, READ BACK BY AND VERIFIED WITH: E.WILLIAMSON,PHARMD AT 2145 ON 12/03/17 BY G.MCADOO    Klebsiella oxytoca NOT DETECTED NOT DETECTED Final   Klebsiella pneumoniae NOT DETECTED NOT DETECTED Final   Proteus species NOT DETECTED NOT DETECTED Final   Serratia marcescens NOT DETECTED NOT DETECTED Final   Carbapenem resistance NOT DETECTED NOT DETECTED Final   Haemophilus influenzae NOT DETECTED NOT DETECTED Final   Neisseria meningitidis NOT DETECTED NOT DETECTED Final   Pseudomonas aeruginosa NOT DETECTED NOT DETECTED Final   Candida albicans NOT DETECTED NOT DETECTED Final   Candida glabrata NOT DETECTED NOT DETECTED Final   Candida krusei NOT DETECTED NOT DETECTED Final   Candida parapsilosis NOT DETECTED NOT DETECTED Final   Candida tropicalis NOT DETECTED  NOT DETECTED Final    Comment: Performed at [facility], [address] [place], [place] [address]  Culture, blood (routine x 2)     Status: Abnormal   Collection Time: 12/03/17  9:54 AM  Result Value Ref Range Status   Specimen Description   Final    LEFT ANTECUBITAL Performed at [facility] [address] [place], [place] [address]    Special Requests   Final    BOTTLES DRAWN AEROBIC AND ANAEROBIC Blood Culture adequate volume Performed at [facility] [address] [place], [place] [address]    Culture  Setup Time   Final    GRAM NEGATIVE RODS IN BOTH AEROBIC AND ANAEROBIC BOTTLES CRITICAL VALUE NOTED.  VALUE IS CONSISTENT WITH PREVIOUSLY REPORTED AND CALLED VALUE.    Culture (A)  Final    ESCHERICHIA COLI SUSCEPTIBILITIES PERFORMED ON PREVIOUS CULTURE WITHIN THE LAST 5 DAYS. Performed at [facility], [place] [address] [place], [place] [address]    Report Status 12/08/2017 FINAL  Final  Culture, blood (routine x 2)     Status: Abnormal   Collection Time: 12/03/17 10:44 AM  Result Value Ref Range Status   Specimen Description   Final    BLOOD LEFT HAND Performed at [facility] [address], [place], [place] [address]    Special Requests   Final    BOTTLES DRAWN AEROBIC AND ANAEROBIC Blood Culture adequate volume Performed at [facility] [address], [place], [place] [address]    Culture  Setup Time   Final    GRAM NEGATIVE RODS IN BOTH AEROBIC AND ANAEROBIC BOTTLES Organism ID to follow CRITICAL RESULT CALLED TO, READ BACK BY AND VERIFIED WITH: E.WILLIAMSON,PHARMD AT 2145 ON 12/03/17 BY G.MCADOO Performed at [facility], [place] [address], [place], [place] [address]    Culture (A)  Final    ESCHERICHIA COLI Confirmed Extended Spectrum Beta-Lactamase Producer (ESBL).  In bloodstream infections from ESBL organisms, carbapenems are preferred over  piperacillin/tazobactam. They are shown to have a lower risk of mortality.    Report Status 12/05/2017 FINAL  Final   Organism ID, Bacteria ESCHERICHIA COLI  Final      Susceptibility   Escherichia coli - MIC*    AMPICILLIN >=32 RESISTANT Resistant     CEFAZOLIN >=64 RESISTANT Resistant     CEFEPIME RESISTANT Resistant     CEFTAZIDIME >=64 RESISTANT Resistant     CEFTRIAXONE >=64 RESISTANT Resistant     CIPROFLOXACIN 1 SENSITIVE Sensitive     GENTAMICIN <=1 SENSITIVE Sensitive     IMIPENEM 1 SENSITIVE Sensitive     TRIMETH/SULFA <=20 SENSITIVE Sensitive     AMPICILLIN/SULBACTAM >=32 RESISTANT Resistant     PIP/TAZO >=128 RESISTANT Resistant     Extended ESBL POSITIVE Resistant     * ESCHERICHIA COLI  Blood Culture ID Panel (Reflexed)     Status: Abnormal   Collection Time: 12/03/17 10:44 AM  Result Value Ref Range Status   Enterococcus species NOT DETECTED NOT DETECTED Final   Listeria monocytogenes NOT DETECTED NOT DETECTED Final   Staphylococcus species NOT DETECTED NOT DETECTED Final   Staphylococcus aureus (BCID) NOT DETECTED NOT DETECTED Final   Streptococcus species NOT DETECTED NOT DETECTED Final   Streptococcus agalactiae NOT DETECTED NOT DETECTED Final   Streptococcus pneumoniae NOT DETECTED NOT DETECTED Final   Streptococcus pyogenes NOT DETECTED NOT DETECTED Final   Acinetobacter baumannii NOT DETECTED NOT DETECTED Final   Enterobacteriaceae species DETECTED (A) NOT DETECTED Corrected  Comment: Enterobacteriaceae represent a large family of gram-negative bacteria, not a single organism. CRITICAL RESULT CALLED TO, READ BACK BY AND VERIFIED WITH: E.WILLIAMSON,PHARMD AT 2145 ON 12/03/17 BY G.MCADOO CORRECTED ON 11/27 AT 2249: PREVIOUSLY REPORTED AS DETECTED Enterobacteriaceae represent a large family of gram negative bacteria, not a single organism. CRITICAL RESULT CALLED TO, READ BACK BY AND VERIFIED WITH: H.BAIRD,PHARMD AT 2143 ON 12/03/17 BY  G.MCADOO     Enterobacter cloacae complex NOT DETECTED NOT DETECTED Final   Escherichia coli DETECTED (A) NOT DETECTED Corrected    Comment: CRITICAL RESULT CALLED TO, READ BACK BY AND VERIFIED WITH: E.WILLIAMSON,PHARMD AT 2145 ON 12/03/17 BY G.MCADOO CORRECTED ON 11/27 AT 2249: PREVIOUSLY REPORTED AS DETECTED CRITICAL RESULT CALLED TO, READ BACK BY AND VERIFIED WITH: H.BAIRD,PHARMD AT 2143 ON 12/03/17 BY G.MCADOO    Klebsiella oxytoca NOT DETECTED NOT DETECTED Final   Klebsiella pneumoniae NOT DETECTED NOT DETECTED Final   Proteus species NOT DETECTED NOT DETECTED Final   Serratia marcescens NOT DETECTED NOT DETECTED Final   Haemophilus influenzae NOT DETECTED NOT DETECTED Final   Neisseria meningitidis NOT DETECTED NOT DETECTED Final   Pseudomonas aeruginosa NOT DETECTED NOT DETECTED Final   Candida albicans NOT DETECTED NOT DETECTED Final   Candida glabrata NOT DETECTED NOT DETECTED Final   Candida krusei NOT DETECTED NOT DETECTED Final   Candida parapsilosis NOT DETECTED NOT DETECTED Final   Candida tropicalis NOT DETECTED NOT DETECTED Final    Comment: Performed at [facility], [place] [address], [place], [place] [address]     Labs: BNP (last 3 results) No results for input(s): BNP in the last 8760 hours. Basic Metabolic Panel: Recent Labs  Lab 12/06/17 0530 12/07/17 0415 12/08/17 0342 12/09/17 0613 12/10/17 0438  NA 134* 136 136 135 136  K 3.9 3.7 3.7 3.8 4.0  CL 107 109 109 106 104  CO2 20* 22 21* 22 26  GLUCOSE 130* 132* 137* 133* 127*  BUN _0 CREATININE 0.56* 0.66 0.46* 0.41* 0.48*  CALCIUM 8.1* 8.1* 8.3* 8.4* 8.5*   Liver Function Tests: Recent Labs  Lab 12/06/17 0530 12/07/17 0415 12/08/17 0342 12/09/17 0613 12/10/17 0438  AST _1 59*  ALT _2 46*  ALKPHOS 203* 225* 310* 374* 548*  BILITOT 2.2* 2.0* 1.8* 1.7* 2.0*  PROT 6.4* 6.3* 6.6 6.8 6.9  ALBUMIN 2.2* 2.2* 2.3* 2.3* 2.3*   No results for input(s): LIPASE, AMYLASE in the last  168 hours. No results for input(s): AMMONIA in the last 168 hours. CBC: Recent Labs  Lab 12/05/17 0727 12/07/17 0415 12/08/17 0342 12/09/17 0613 12/10/17 0438  WBC 7.0 4.0 5.8 5.5 6.0  NEUTROABS  --   --   --  3.2  --   HGB 8.4* 8.0* 8.7* 9.0* 9.2*  HCT 26.7* 26.0* 28.0* 29.1* 29.4*  MCV 93.0 91.9 91.8 92.1 93.6  PLT 125* 123* 149* 204 222   Cardiac Enzymes: No results for input(s): CKTOTAL, CKMB, CKMBINDEX, TROPONINI in the last 168 hours. BNP: Invalid input(s): POCBNP CBG: Recent Labs  Lab 12/10/17 0736 12/10/17 1132 12/10/17 1731 12/10/17 2122 12/11/17 0722  GLUCAP 104* 148* 135* 181* 94   D-Dimer No results for input(s): DDIMER in the last 72 hours. Hgb A1c No results for input(s): HGBA1C in the last 72 hours. Lipid Profile No results for input(s): CHOL, HDL, LDLCALC, TRIG, CHOLHDL, LDLDIRECT in the last 72 hours. Thyroid function studies No results for input(s): TSH, T4TOTAL,  T3FREE, THYROIDAB in the last 72 hours.  Invalid input(s): FREET3 Anemia work up No results for input(s): VITAMINB12, FOLATE, FERRITIN, TIBC, IRON, RETICCTPCT in the last 72 hours. Urinalysis    Component Value Date/Time   COLORURINE YELLOW 11/20/2017 1653   APPEARANCEUR HAZY (A) 11/20/2017 1653   LABSPEC 1.011 11/20/2017 1653   PHURINE 5.0 11/20/2017 1653   GLUCOSEU 150 (A) 11/20/2017 1653   HGBUR NEGATIVE 11/20/2017 1653   BILIRUBINUR NEGATIVE 11/20/2017 1653   KETONESUR NEGATIVE 11/20/2017 1653   PROTEINUR NEGATIVE 11/20/2017 1653   UROBILINOGEN 0.2 08/15/2011 1816   NITRITE NEGATIVE 11/20/2017 1653   LEUKOCYTESUR NEGATIVE 11/20/2017 1653   Sepsis Labs Invalid input(s): PROCALCITONIN,  WBC,  LACTICIDVEN Microbiology Recent Results (from the past 240 hour(s))  Culture, Blood     Status: None   Collection Time: 12/02/17 12:51 PM  Result Value Ref Range Status   Specimen Description BLOOD LEFT HAND  Final   Special Requests   Final    BOTTLES DRAWN AEROBIC AND ANAEROBIC  Blood Culture adequate volume   Culture   Final    NO GROWTH 5 DAYS Performed at [facility], [place] [address], [place], [place] [address]    Report Status 12/07/2017 FINAL  Final  Culture, Blood     Status: Abnormal   Collection Time: 12/02/17 12:51 PM  Result Value Ref Range Status   Specimen Description BLOOD RIGHT HAND  Final   Special Requests   Final    BOTTLES DRAWN AEROBIC AND ANAEROBIC Blood Culture adequate volume   Culture  Setup Time   Final    GRAM NEGATIVE RODS ANAEROBIC BOTTLE ONLY Organism ID to follow    Culture (A)  Final    ESCHERICHIA COLI SUSCEPTIBILITIES PERFORMED ON PREVIOUS CULTURE WITHIN THE LAST 5 DAYS. Performed at [facility], [place] [address], [place],  [address]    Report Status 12/05/2017 FINAL  Final  Blood Culture ID Panel (Reflexed)     Status: Abnormal   Collection Time: 12/02/17 12:51 PM  Result Value Ref Range Status   Enterococcus species NOT DETECTED NOT DETECTED Final   Listeria monocytogenes NOT DETECTED NOT DETECTED Final   Staphylococcus species NOT DETECTED NOT DETECTED Final   Staphylococcus aureus (BCID) NOT DETECTED NOT DETECTED Final   Streptococcus species NOT DETECTED NOT DETECTED Final   Streptococcus agalactiae NOT DETECTED NOT DETECTED Final   Streptococcus pneumoniae NOT DETECTED NOT DETECTED Final   Streptococcus pyogenes NOT DETECTED NOT DETECTED Final   Acinetobacter baumannii NOT DETECTED NOT DETECTED Final   Enterobacteriaceae species DETECTED (A) NOT DETECTED Final    Comment: Enterobacteriaceae represent a large family of gram-negative bacteria, not a single organism. CRITICAL RESULT CALLED TO, READ BACK BY AND VERIFIED WITH: E.WILLIAMSON,PHARMD AT 2145 ON 12/03/17 BY G.MCADOO    Enterobacter cloacae complex NOT DETECTED NOT DETECTED Final   Escherichia coli DETECTED (A) NOT DETECTED Final    Comment: CRITICAL RESULT CALLED TO, READ BACK BY AND VERIFIED WITH: E.WILLIAMSON,PHARMD AT 2145 ON  12/03/17 BY G.MCADOO    Klebsiella oxytoca NOT DETECTED NOT DETECTED Final   Klebsiella pneumoniae NOT DETECTED NOT DETECTED Final   Proteus species NOT DETECTED NOT DETECTED Final   Serratia marcescens NOT DETECTED NOT DETECTED Final   Carbapenem resistance NOT DETECTED NOT DETECTED Final   Haemophilus influenzae NOT DETECTED NOT DETECTED Final   Neisseria meningitidis NOT DETECTED NOT DETECTED Final   Pseudomonas aeruginosa NOT DETECTED NOT DETECTED Final   Candida albicans NOT  DETECTED NOT DETECTED Final   Candida glabrata NOT DETECTED NOT DETECTED Final   Candida krusei NOT DETECTED NOT DETECTED Final   Candida parapsilosis NOT DETECTED NOT DETECTED Final   Candida tropicalis NOT DETECTED NOT DETECTED Final    Comment: Performed at [facility], [place] [address], [place], [place] [address]  Culture, blood (routine x 2)     Status: Abnormal   Collection Time: 12/03/17  9:54 AM  Result Value Ref Range Status   Specimen Description   Final    LEFT ANTECUBITAL Performed at [facility] [address], [place], [place] [address]    Special Requests   Final    BOTTLES DRAWN AEROBIC AND ANAEROBIC Blood Culture adequate volume Performed at [facility] [address] [place], [place] [address]    Culture  Setup Time   Final    GRAM NEGATIVE RODS IN BOTH AEROBIC AND ANAEROBIC BOTTLES CRITICAL VALUE NOTED.  VALUE IS CONSISTENT WITH PREVIOUSLY REPORTED AND CALLED VALUE.    Culture (A)  Final    ESCHERICHIA COLI SUSCEPTIBILITIES PERFORMED ON PREVIOUS CULTURE WITHIN THE LAST 5 DAYS. Performed at [facility], [place] [address], [place], [place] [address]    Report Status 12/08/2017 FINAL  Final  Culture, blood (routine x 2)     Status: Abnormal   Collection Time: 12/03/17 10:44 AM  Result Value Ref Range Status   Specimen Description   Final    BLOOD LEFT HAND Performed at [facility] [address]  [place], [place] [address]    Special Requests   Final    BOTTLES DRAWN AEROBIC AND ANAEROBIC Blood Culture adequate volume Performed at [facility] [address] [place], [place] [address]    Culture  Setup Time   Final    GRAM NEGATIVE RODS IN BOTH AEROBIC AND ANAEROBIC BOTTLES Organism ID to follow CRITICAL RESULT CALLED TO, READ BACK BY AND VERIFIED WITH: E.WILLIAMSON,PHARMD AT 2145 ON 12/03/17 BY G.MCADOO Performed at [facility], [place] [address] [place], [place] [address]    Culture (A)  Final    ESCHERICHIA COLI Confirmed Extended Spectrum Beta-Lactamase Producer (ESBL).  In bloodstream infections from ESBL organisms, carbapenems are preferred over piperacillin/tazobactam. They are shown to have a lower risk of mortality.    Report Status 12/05/2017 FINAL  Final   Organism ID, Bacteria ESCHERICHIA COLI  Final      Susceptibility   Escherichia coli - MIC*    AMPICILLIN >=32 RESISTANT Resistant     CEFAZOLIN >=64 RESISTANT Resistant     CEFEPIME RESISTANT Resistant     CEFTAZIDIME >=64 RESISTANT Resistant     CEFTRIAXONE >=64 RESISTANT Resistant     CIPROFLOXACIN 1 SENSITIVE Sensitive     GENTAMICIN <=1 SENSITIVE Sensitive     IMIPENEM 1 SENSITIVE Sensitive     TRIMETH/SULFA <=20 SENSITIVE Sensitive     AMPICILLIN/SULBACTAM >=32 RESISTANT Resistant     PIP/TAZO >=128 RESISTANT Resistant     Extended ESBL POSITIVE Resistant     * ESCHERICHIA COLI  Blood Culture ID Panel (Reflexed)     Status: Abnormal   Collection Time: 12/03/17 10:44 AM  Result Value Ref Range Status   Enterococcus species NOT DETECTED NOT DETECTED Final   Listeria monocytogenes NOT DETECTED NOT DETECTED Final   Staphylococcus species NOT DETECTED NOT DETECTED Final   Staphylococcus aureus (BCID) NOT DETECTED NOT DETECTED Final   Streptococcus species NOT DETECTED  NOT DETECTED Final   Streptococcus agalactiae NOT DETECTED NOT DETECTED Final   Streptococcus pneumoniae NOT  DETECTED NOT DETECTED Final   Streptococcus pyogenes NOT DETECTED NOT DETECTED Final   Acinetobacter baumannii NOT DETECTED NOT DETECTED Final   Enterobacteriaceae species DETECTED (A) NOT DETECTED Corrected    Comment: Enterobacteriaceae represent a large family of gram-negative bacteria, not a single organism. CRITICAL RESULT CALLED TO, READ BACK BY AND VERIFIED WITH: E.WILLIAMSON,PHARMD AT 2145 ON 12/03/17 BY G.MCADOO CORRECTED ON 11/27 AT 2249: PREVIOUSLY REPORTED AS DETECTED Enterobacteriaceae represent a large family of gram negative bacteria, not a single organism. CRITICAL RESULT CALLED TO, READ BACK BY AND VERIFIED WITH: H.BAIRD,PHARMD AT 2143 ON 12/03/17 BY  G.MCADOO    Enterobacter cloacae complex NOT DETECTED NOT DETECTED Final   Escherichia coli DETECTED (A) NOT DETECTED Corrected    Comment: CRITICAL RESULT CALLED TO, READ BACK BY AND VERIFIED WITH: E.WILLIAMSON,PHARMD AT 2145 ON 12/03/17 BY G.MCADOO CORRECTED ON 11/27 AT 2249: PREVIOUSLY REPORTED AS DETECTED CRITICAL RESULT CALLED TO, READ BACK BY AND VERIFIED WITH: H.BAIRD,PHARMD AT 2143 ON 12/03/17 BY G.MCADOO    Klebsiella oxytoca NOT DETECTED NOT DETECTED Final   Klebsiella pneumoniae NOT DETECTED NOT DETECTED Final   Proteus species NOT DETECTED NOT DETECTED Final   Serratia marcescens NOT DETECTED NOT DETECTED Final   Haemophilus influenzae NOT DETECTED NOT DETECTED Final   Neisseria meningitidis NOT DETECTED NOT DETECTED Final   Pseudomonas aeruginosa NOT DETECTED NOT DETECTED Final   Candida albicans NOT DETECTED NOT DETECTED Final   Candida glabrata NOT DETECTED NOT DETECTED Final   Candida krusei NOT DETECTED NOT DETECTED Final   Candida parapsilosis NOT DETECTED NOT DETECTED Final   Candida tropicalis NOT DETECTED NOT DETECTED Final    Comment: Performed at [facility], [place] [address] [place], [place] [address]     Time coordinating discharge: 35 minutes.   SIGNED:   Elmarie Shiley, MD  [facility] 12/11/2017, 9:02 AM Pager   If 7PM-7AM, please contact night-coverage [email or web address] Password [email or web address]

## 2017-12-12 ENCOUNTER — Telehealth: Payer: Self-pay | Admitting: Hematology

## 2017-12-12 DIAGNOSIS — R7881 Bacteremia: Secondary | ICD-10-CM | POA: Diagnosis not present

## 2017-12-12 DIAGNOSIS — C7970 Secondary malignant neoplasm of unspecified adrenal gland: Secondary | ICD-10-CM | POA: Diagnosis not present

## 2017-12-12 DIAGNOSIS — A4151 Sepsis due to Escherichia coli [E. coli]: Secondary | ICD-10-CM | POA: Diagnosis not present

## 2017-12-12 DIAGNOSIS — C7972 Secondary malignant neoplasm of left adrenal gland: Secondary | ICD-10-CM | POA: Diagnosis not present

## 2017-12-12 NOTE — Telephone Encounter (Signed)
Per staff message from [facility] added lab/port/fu 12/11 - tx scheduled for 12/12. Cancelled 12/12 lab/port - tx only. Left message for patient. Schedule mailed.

## 2017-12-15 ENCOUNTER — Telehealth: Payer: Self-pay

## 2017-12-15 DIAGNOSIS — A4151 Sepsis due to Escherichia coli [E. coli]: Secondary | ICD-10-CM | POA: Diagnosis not present

## 2017-12-15 DIAGNOSIS — C221 Intrahepatic bile duct carcinoma: Secondary | ICD-10-CM | POA: Diagnosis not present

## 2017-12-15 DIAGNOSIS — C7972 Secondary malignant neoplasm of left adrenal gland: Secondary | ICD-10-CM | POA: Diagnosis not present

## 2017-12-15 DIAGNOSIS — A4150 Gram-negative sepsis, unspecified: Secondary | ICD-10-CM | POA: Diagnosis not present

## 2017-12-15 DIAGNOSIS — R6520 Severe sepsis without septic shock: Secondary | ICD-10-CM | POA: Diagnosis not present

## 2017-12-15 LAB — CULTURE, BLOOD (ROUTINE X 2)
Culture: NO GROWTH
Culture: NO GROWTH
Special Requests: ADEQUATE
Special Requests: ADEQUATE

## 2017-12-15 NOTE — Telephone Encounter (Signed)
Faxed signed order back to [facility], sent to [facility] for scanning to chart.

## 2017-12-17 ENCOUNTER — Inpatient Hospital Stay: Payer: Medicare Other

## 2017-12-17 ENCOUNTER — Encounter: Payer: Self-pay | Admitting: Hematology

## 2017-12-17 ENCOUNTER — Inpatient Hospital Stay: Payer: Medicare Other | Attending: Hematology | Admitting: Hematology

## 2017-12-17 VITALS — BP 106/71 | HR 104 | Temp 98.3°F | Resp 17 | Ht 71.0 in | Wt 208.3 lb

## 2017-12-17 DIAGNOSIS — I2510 Atherosclerotic heart disease of native coronary artery without angina pectoris: Secondary | ICD-10-CM | POA: Diagnosis not present

## 2017-12-17 DIAGNOSIS — Z66 Do not resuscitate: Secondary | ICD-10-CM | POA: Diagnosis not present

## 2017-12-17 DIAGNOSIS — K7030 Alcoholic cirrhosis of liver without ascites: Secondary | ICD-10-CM | POA: Insufficient documentation

## 2017-12-17 DIAGNOSIS — R21 Rash and other nonspecific skin eruption: Secondary | ICD-10-CM | POA: Diagnosis not present

## 2017-12-17 DIAGNOSIS — Z5111 Encounter for antineoplastic chemotherapy: Secondary | ICD-10-CM | POA: Insufficient documentation

## 2017-12-17 DIAGNOSIS — C7970 Secondary malignant neoplasm of unspecified adrenal gland: Secondary | ICD-10-CM | POA: Diagnosis not present

## 2017-12-17 DIAGNOSIS — Z794 Long term (current) use of insulin: Secondary | ICD-10-CM

## 2017-12-17 DIAGNOSIS — E1165 Type 2 diabetes mellitus with hyperglycemia: Secondary | ICD-10-CM | POA: Diagnosis not present

## 2017-12-17 DIAGNOSIS — E119 Type 2 diabetes mellitus without complications: Secondary | ICD-10-CM | POA: Insufficient documentation

## 2017-12-17 DIAGNOSIS — C221 Intrahepatic bile duct carcinoma: Secondary | ICD-10-CM | POA: Diagnosis not present

## 2017-12-17 DIAGNOSIS — C7971 Secondary malignant neoplasm of right adrenal gland: Secondary | ICD-10-CM

## 2017-12-17 DIAGNOSIS — Z95828 Presence of other vascular implants and grafts: Secondary | ICD-10-CM

## 2017-12-17 LAB — CBC WITH DIFFERENTIAL (CANCER CENTER ONLY)
Abs Immature Granulocytes: 0.03 10*3/uL (ref 0.00–0.07)
Basophils Absolute: 0 10*3/uL (ref 0.0–0.1)
Basophils Relative: 1 %
Eosinophils Absolute: 0.6 10*3/uL — ABNORMAL HIGH (ref 0.0–0.5)
Eosinophils Relative: 7 %
HCT: 30.7 % — ABNORMAL LOW (ref 39.0–52.0)
Hemoglobin: 10 g/dL — ABNORMAL LOW (ref 13.0–17.0)
Immature Granulocytes: 0 %
Lymphocytes Relative: 12 %
Lymphs Abs: 1 10*3/uL (ref 0.7–4.0)
MCH: 29.1 pg (ref 26.0–34.0)
MCHC: 32.6 g/dL (ref 30.0–36.0)
MCV: 89.2 fL (ref 80.0–100.0)
Monocytes Absolute: 0.8 10*3/uL (ref 0.1–1.0)
Monocytes Relative: 10 %
Neutro Abs: 5.8 10*3/uL (ref 1.7–7.7)
Neutrophils Relative %: 70 %
Platelet Count: 374 10*3/uL (ref 150–400)
RBC: 3.44 MIL/uL — ABNORMAL LOW (ref 4.22–5.81)
RDW: 17.6 % — ABNORMAL HIGH (ref 11.5–15.5)
WBC Count: 8.2 10*3/uL (ref 4.0–10.5)
nRBC: 0 % (ref 0.0–0.2)

## 2017-12-17 LAB — CMP (CANCER CENTER ONLY)
ALK PHOS: 1076 U/L — AB (ref 38–126)
ALT: 168 U/L — ABNORMAL HIGH (ref 0–44)
ANION GAP: 7 (ref 5–15)
AST: 155 U/L — ABNORMAL HIGH (ref 15–41)
Albumin: 2.5 g/dL — ABNORMAL LOW (ref 3.5–5.0)
BUN: 12 mg/dL (ref 8–23)
CHLORIDE: 102 mmol/L (ref 98–111)
CO2: 27 mmol/L (ref 22–32)
Calcium: 9 mg/dL (ref 8.9–10.3)
Creatinine: 0.72 mg/dL (ref 0.61–1.24)
GFR, Est AFR Am: >60 mL/min (ref >60)
GFR, Estimated: >60 mL/min (ref >60)
Glucose, Bld: 147 mg/dL — ABNORMAL HIGH (ref 70–99)
POTASSIUM: 4.3 mmol/L (ref 3.5–5.1)
Sodium: 136 mmol/L (ref 135–145)
Total Bilirubin: 4.5 mg/dL (ref 0.3–1.2)
Total Protein: 7.8 g/dL (ref 6.5–8.1)

## 2017-12-17 MED ORDER — HEPARIN SOD (PORK) LOCK FLUSH 100 UNIT/ML IV SOLN
500.0000 [IU] | Freq: Once | INTRAVENOUS | Status: AC
  Administered 2017-12-17: 500 [IU] via INTRAVENOUS
  Filled 2017-12-17: qty 5

## 2017-12-17 MED ORDER — SODIUM CHLORIDE 0.9% FLUSH
10.0000 mL | Freq: Once | INTRAVENOUS | Status: AC
  Administered 2017-12-17: 10 mL via INTRAVENOUS
  Filled 2017-12-17: qty 10

## 2017-12-17 NOTE — Patient Instructions (Signed)
Implanted Port Home Guide An implanted port is a type of central line that is placed under the skin. Central lines are used to provide IV access when treatment or nutrition needs to be given through a person's veins. Implanted ports are used for long-term IV access. An implanted port may be placed because:  You need IV medicine that would be irritating to the small veins in your hands or arms.  You need long-term IV medicines, such as antibiotics.  You need IV nutrition for a long period.  You need frequent blood draws for lab tests.  You need dialysis.  Implanted ports are usually placed in the chest area, but they can also be placed in the upper arm, the abdomen, or the leg. An implanted port has two main parts:  Reservoir. The reservoir is round and will appear as a small, raised area under your skin. The reservoir is the part where a needle is inserted to give medicines or draw blood.  Catheter. The catheter is a thin, flexible tube that extends from the reservoir. The catheter is placed into a large vein. Medicine that is inserted into the reservoir goes into the catheter and then into the vein.  How will I care for my incision site? Do not get the incision site wet. Bathe or shower as directed by your health care provider. How is my port accessed? Special steps must be taken to access the port:  Before the port is accessed, a numbing cream can be placed on the skin. This helps numb the skin over the port site.  Your health care provider uses a sterile technique to access the port. ? Your health care provider must put on a mask and sterile gloves. ? The skin over your port is cleaned carefully with an antiseptic and allowed to dry. ? The port is gently pinched between sterile gloves, and a needle is inserted into the port.  Only "non-coring" port needles should be used to access the port. Once the port is accessed, a blood return should be checked. This helps ensure that the port  is in the vein and is not clogged.  If your port needs to remain accessed for a constant infusion, a clear (transparent) bandage will be placed over the needle site. The bandage and needle will need to be changed every week, or as directed by your health care provider.  Keep the bandage covering the needle clean and dry. Do not get it wet. Follow your health care provider's instructions on how to take a shower or bath while the port is accessed.  If your port does not need to stay accessed, no bandage is needed over the port.  What is flushing? Flushing helps keep the port from getting clogged. Follow your health care provider's instructions on how and when to flush the port. Ports are usually flushed with saline solution or a medicine called heparin. The need for flushing will depend on how the port is used.  If the port is used for intermittent medicines or blood draws, the port will need to be flushed: ? After medicines have been given. ? After blood has been drawn. ? As part of routine maintenance.  If a constant infusion is running, the port may not need to be flushed.  How long will my port stay implanted? The port can stay in for as long as your health care provider thinks it is needed. When it is time for the port to come out, surgery will be   done to remove it. The procedure is similar to the one performed when the port was put in. When should I seek immediate medical care? When you have an implanted port, you should seek immediate medical care if:  You notice a bad smell coming from the incision site.  You have swelling, redness, or drainage at the incision site.  You have more swelling or pain at the port site or the surrounding area.  You have a fever that is not controlled with medicine.  This information is not intended to replace advice given to you by your health care provider. Make sure you discuss any questions you have with your health care provider. Document  Released: 12/24/2004 Document Revised: 06/01/2015 Document Reviewed: 08/31/2012 Elsevier Interactive Patient Education  2017 [employer]  

## 2017-12-17 NOTE — Progress Notes (Signed)
Port accessed 12/15/17 by home health nurse. Labs drawn today with good blood return and port flushed easily. Patient states he has one more dose of home antibiotics this evening. When patient comes for chemo infusion tomorrow port will be re accessed by [facility].

## 2017-12-17 NOTE — Progress Notes (Signed)
[name]   Telephone:([phone] Fax:(336) [phone]   Clinic Follow up Note   Patient Care Team: [name], MD as PCP - General (Internal Medicine)  Date of Service:  12/17/2017  CHIEF COMPLAINT: F/u on metastatic cholangiocarcinoma  SUMMARY OF ONCOLOGIC HISTORY: Oncology History   Cancer Staging Intrahepatic cholangiocarcinoma ([place]) Staging form: Intrahepatic Bile Duct, AJCC 8th Edition - Clinical stage from 10/29/2017: Stage IV (cT2, cN1, pM1) - Signed by [name], MD on 11/06/2017       Malignant neoplasm metastatic to adrenal gland [facility])    Initial Diagnosis    Malignant neoplasm metastatic to adrenal gland ([place])    10/23/2017 Imaging    ABD US IMPRESSION: Cirrhotic appearance of the liver.  2.6 cm questionable ill-defined mass versus area of focal parenchymal heterogeneity in segment 6 of the liver. If further imaging evaluation is desired, liver protocol abdominal MRI should be considered.  Splenomegaly.  Apparent thickening of the gallbladder wall, without evidence of cholelithiasis, and with negative sonographic Murphy's sign. The findings are favored to represent gallbladder wall edema secondary to systemic causes such as hyperproteinemia rather than acute cholecystitis. Please correlate clinically.     10/25/2017 Imaging    MRI IMPRESSION: 1. Advanced changes of cirrhosis. Interval development of multifocal enhancing liver lesions, which is concerning for multicentric hepatocellular carcinoma involving both lobes of liver. 2. Lesion within segment 4 B obstructs the right medial hepatic duct and left main duct resulting in moderate to marked biliary ductal dilatation within the left lobe and dome of liver. There may be partial obstruction of the right lateral duct with mild dilatation. 3. Tumor thrombus within the left branch of the portal vein likely secondary to segment 4 B tumor. 4. Bilateral adrenal metastasis and  retroperitoneal nodal metastasis. 5. Stigmata of portal venous hypertension including varices, ascites and splenomegaly. 6. These results will be called to the ordering clinician or representative by the Radiologist Assistant, and communication documented in the PACS or zVision Dashboard.    10/26/2017 Procedure    IMPRESSION: Status post formal PTC and internal/external biliary drainage.    10/27/2017 Imaging    IMPRESSION: Changes of cirrhosis. Multiple hepatic lesions are noted concerning for multifocal hepatoma or metastases. Biliary drain is in place with the tip in the distal common bile duct. There is mild intrahepatic biliary ductal dilatation. Bilateral adrenal nodules and masses, right larger than left compatible with metastases. Retroperitoneal adenopathy. Large stool burden with mild gaseous distention of the colon. Aortic atherosclerosis.     10/29/2017 Pathology Results    Diagnosis Soft Tissue Needle Core Biopsy, right adrenal mass - METASTATIC ADENOCARCINOMA, SEE COMMENT. Microscopic Comment There are malignant glands with central necrosis. Immunohistochemistry is positive for cytokeratin 20 and CDX-2. Cytokeratin 7, TTF-1 and NapsinA are negative. The morphology and immunoprofile favor a colorectal primary over pancreaticobiliary. [name] has reviewed the case. [name] was notified on 10/31/2017.    11/04/2017 Procedure    Colonoscopy impression - Seton in place found on digital rectal exam. - Congested mucosa in the rectum, in the sigmoid colon and in the descending colon. - A few diminutive polyps in the rectum and in the sigmoid colon. No colon or rectal mass identified. - No specimens collected.     Intrahepatic cholangiocarcinoma ([place])   10/27/2017 Imaging    10/27/2017 CT AP IMPRESSION: Changes of cirrhosis. Multiple hepatic lesions are noted concerning for multifocal hepatoma or metastases.  Biliary drain is in place with the tip in the  distal  common bile duct. There is mild intrahepatic biliary ductal dilatation.  Bilateral adrenal nodules and masses, right larger than left compatible with metastases.  Retroperitoneal adenopathy.  Large stool burden with mild gaseous distention of the colon.  Aortic atherosclerosis.    10/29/2017 Cancer Staging    Staging form: Intrahepatic Bile Duct, AJCC 8th Edition - Clinical stage from 10/29/2017: Stage IV (cT2, cN1, pM1) - Signed by [name], MD on 11/06/2017    10/29/2017 Pathology Results    10/29/2017 Surgical Pathology Diagnosis Soft Tissue Needle Core Biopsy, right adrenal mass - METASTATIC ADENOCARCINOMA, SEE COMMENT.    11/06/2017 Initial Diagnosis    Intrahepatic cholangiocarcinoma ([place])    11/06/2017 Imaging    11/06/2017 CT AP IMPRESSION: 1. Increase in RIGHT pleural effusion. Stable masslike consolidation at the RIGHT lung base representing neoplasm versus pneumonia. 2. Stable metastatic nodule at the RIGHT lung base. 3. Infiltrative mass in the central LEFT hepatic lobe and discrete lesions in the RIGHT hepatic lobe consistent with metastatic disease versus primary hepatic biliary carcinoma. Favor cholangiocarcinoma. 4. Percutaneous drainage catheter extends the porta hepatis. Small trapped pneumothorax along the tract of the catheter at the inferolateral RIGHT lung base. 5. Stable adrenal metastasis. 6. Metastatic adenopathy in the retroperitoneum unchanged. 7. Morphologic changes in liver consistent with  cirrhosis.    11/08/2017 Imaging    11/08/2017 CT AP IMPRESSION: 1. Overall no significant interval change since the CT of 11/06/2017. Stable positioning of the percutaneous biliary drain with similar degree of biliary ductal dilatation. 2. Cirrhosis with multiple hepatic metastatic disease. Infiltrative appearing lesion in the left lobe of the liver appears to extend into the left portal vein and may represent a tumor thrombus, or bland thrombus. A  primary biliary neoplasm (cholangiocarcinoma, or metastatic disease are other considerations. 3. Retroperitoneal, mesenteric, and adrenal metastatic disease. 4. Hepatic colopathy versus less likely segmental colitis of the hepatic flexure. 5. Small right pleural effusion and bilateral lung base pulmonary masses/nodules as seen previously. 6. Tiny pockets of pneumoperitoneum lateral to the liver along the percutaneous drain, decreased since the prior CT. 7. Other findings as above.     Chemotherapy    Plan to start first line chemo gemcitabine/cisplatin on days 1 and 15 every 28 days starting 12/18/17    11/20/2017 - 11/24/2017 Hospital Admission    Admit date: 11/08/2017 Admission diagnosis: fatigue Additional comments: discharged 11/24/2017      CURRENT THERAPY:  Plan to start first line chemo gemcitabine/cisplatin on days 1 and 15 every 28 days starting 12/18/17  INTERVAL HISTORY:  Matthew Lewis is here for a follow up after hospitalization of septic shock. He presents to the clinic today accompanied by his son. He denies pain from catheter for antibiotics but has discomfort. He is able to eat adequately. His skin, eyes and urine remain with signs of jaundice. He is overall ready to start treatment tomorrow.     REVIEW OF SYSTEMS:   Constitutional: Denies fevers, chills or abnormal weight loss Eyes: Denies blurriness of vision (+) jaundice of eyes Ears, nose, mouth, throat, and face: Denies mucositis or sore throat Respiratory: Denies cough, dyspnea or wheezes Cardiovascular: Denies palpitation, chest discomfort or lower extremity swelling UI: (+) Dark urine  Gastrointestinal:  Denies nausea, heartburn or change in bowel habits (+) catheter placed Skin: Denies abnormal skin rashes (+) jaundice of skin Lymphatics: Denies new lymphadenopathy or easy bruising Neurological:Denies numbness, tingling or new weaknesses Behavioral/Psych: Mood is stable, no new changes  All other  systems were reviewed with the patient and are negative.  MEDICAL HISTORY:  Past Medical History:  Diagnosis Date  . Abscess of anal and rectal regions    horse shoe abscess  . Alcoholic cirrhosis ([place])   . Arthritis    "everywhere; especially in my spine"  . Cirrhosis ([place])    STAGE 1 CIRRHOSIS PATIENT SEES DR NAMDIGAM FOR  . Colon cancer ([place]) 05/2005   TX SURGERY WITH LYMPH NODE REMOVAL  . Cough    LAST FEW WEEKS SAW DR RAMOS LUNGS MILKY SPUTUM OCC  . DM type 2 with diabetic peripheral neuropathy ([facility])    left foot  . Fatigue   . Fistula, anal    multiple  . History of substance abuse ([place])    last alcohol was 08/2011; last marijuania was 08/2011  . Hypertension   . PTSD (post-traumatic stress disorder)    "severe" HX OF  . Sleep deprivation     SURGICAL HISTORY: Past Surgical History:  Procedure Laterality Date  . ABSCESS DRAINAGE     "probably 15-20 so far at least; rectal"  . ANAL FISTULECTOMY  12/13/1998  . ANTERIOR FUSION CERVICAL SPINE  2010   "triple"; "did 2 surgeries on the same day in 8416 due to complications"  . CORONARY ANGIOPLASTY  2001  . detached muscle  2010   right chest; "after cervical fusion complications"  . ELBOW SURGERY  2007   "cut out part of a muscle"; left  . EXAMINATION UNDER ANESTHESIA  03/22/2005   fistula  . EXAMINATION UNDER ANESTHESIA  06/06/2011   Procedure: EXAM UNDER ANESTHESIA;  Surgeon: Stark Klein, MD;  Location: [place];  Service: General;  Laterality: N/A;  . HEMICOLECTOMY  06/06/2005   left  . INCISION AND DRAINAGE PERIRECTAL ABSCESS  03/30/2005  . INCISION AND DRAINAGE PERIRECTAL ABSCESS  12/13/2004  . INCISION AND DRAINAGE PERIRECTAL ABSCESS  07/14/2001  . INCISION AND DRAINAGE PERIRECTAL ABSCESS  08/12/2010   horseshoe abscess; Dr Redmond Pulling  . INCISION AND DRAINAGE PERIRECTAL ABSCESS  06/06/2011   Procedure: IRRIGATION AND DEBRIDEMENT PERIRECTAL ABSCESS;  Surgeon: Stark Klein, MD;  Location: [place];  Service: General;  Laterality:  N/A;  . INCISION AND DRAINAGE PERIRECTAL ABSCESS N/A 02/24/2017   Procedure: IRRIGATION AND DEBRIDEMENT PERIRECTAL ABSCESS;  Surgeon: Ralene Ok, MD;  Location: [place];  Service: General;  Laterality: N/A;  . IR EXCHANGE BILIARY DRAIN  11/07/2017  . IR EXCHANGE BILIARY DRAIN  12/09/2017  . IR IMAGING GUIDED PORT INSERTION  12/09/2017  . IR INT EXT BILIARY DRAIN WITH CHOLANGIOGRAM  10/26/2017  . IR PATIENT EVAL TECH 0-60 MINS  12/02/2017  . IR RADIOLOGIST EVAL & MGMT  11/06/2017  . [place]  . PERCUTANEOUS PINNING PHALANX FRACTURE OF HAND  ~ 2008   "plates in 2 places"; right  . PLACEMENT OF SETON N/A 09/05/2017   Procedure: PLACEMENT OF SETON;  Surgeon: Leighton Ruff, MD;  Location: [facility];  Service: General;  Laterality: N/A;  . RADIOLOGY WITH ANESTHESIA N/A 10/25/2017   Procedure: MRI WITH ANESTHESIA;  Surgeon: Radiologist, Medication, MD;  Location: [place];  Service: Radiology;  Laterality: N/A;  . THORACIC DISCECTOMY  1990's    I have reviewed the social history and family history with the patient and they are unchanged from previous note.  ALLERGIES:  is allergic to morphine and related.  MEDICATIONS:  Current Outpatient Medications  Medication Sig Dispense Refill  . aspirin EC 81 MG tablet Take 81  mg by mouth daily.    . calcium carbonate (TUMS - DOSED IN MG ELEMENTAL CALCIUM) 500 MG chewable tablet Chew 1 tablet by mouth 2 (two) times daily as needed for indigestion or heartburn.    . celecoxib (CELEBREX) 200 MG capsule Take 200 mg by mouth at bedtime.   5  . fexofenadine (ALLEGRA) 60 MG tablet Take 60 mg by mouth 2 (two) times daily.    Marland Kitchen gabapentin (NEURONTIN) 400 MG capsule Take 400-800 mg by mouth See admin instructions. Take 400 mg by mouth in the morning and 800 mg in the evening    . lactose free nutrition (BOOST PLUS) LIQD Take 237 mLs by mouth 3 (three) times daily with meals. 30 Can 0  . Multiple Vitamin (MULTIVITAMIN) tablet Take  1 tablet by mouth daily.    . ondansetron (ZOFRAN) 8 MG tablet Take 1 tablet (8 mg total) by mouth 2 (two) times daily as needed. Start on the third day after chemotherapy. 30 tablet 1  . polyvinyl alcohol (LIQUIFILM TEARS) 1.4 % ophthalmic solution Place 1 drop into both eyes daily as needed for dry eyes.     Marland Kitchen prochlorperazine (COMPAZINE) 10 MG tablet Take 1 tablet (10 mg total) by mouth every 6 (six) hours as needed (Nausea or vomiting). 30 tablet 1   No current facility-administered medications for this visit.     PHYSICAL EXAMINATION: ECOG PERFORMANCE STATUS: 2 - Symptomatic, <50% confined to bed  Vitals:   12/17/17 1204  BP: 106/71  Pulse: (!) 104  Resp: 17  Temp: 98.3 F (36.8 C)  SpO2: 97%   Filed Weights   12/17/17 1204  Weight: 208 lb 4.8 oz (94.5 kg)    GENERAL:alert, no distress and comfortable SKIN: skin texture, turgor are normal, no rashes or significant lesions (+) jaundice of skin EYES: normal, Conjunctiva are pink and non-injected, sclera clear (+) jaundice of eyes OROPHARYNX:no exudate, no erythema and lips, buccal mucosa, and tongue normal  NECK: supple, thyroid normal size, non-tender, without nodularity LYMPH:  no palpable lymphadenopathy in the cervical, axillary or inguinal LUNGS: clear to auscultation and percussion with normal breathing effort HEART: regular rate & rhythm and no murmurs and no lower extremity edema ABDOMEN:abdomen soft, non-tender and normal bowel sounds (+) Gauze covering right abdomen (+) catheter in place  Musculoskeletal:no cyanosis of digits and no clubbing  NEURO: alert & oriented x 3 with fluent speech, no focal motor/sensory deficits  LABORATORY DATA:  I have reviewed the data as listed CBC Latest Ref Rng & Units 12/17/2017 12/10/2017 12/09/2017  WBC 4.0 - 10.5 K/uL 8.2 6.0 5.5  Hemoglobin 13.0 - 17.0 g/dL 10.0(L) 9.2(L) 9.0(L)  Hematocrit 39.0 - 52.0 % 30.7(L) 29.4(L) 29.1(L)  Platelets 150 - 400 K/uL 374 222 204     CMP  Latest Ref Rng & Units 12/17/2017 12/10/2017 12/09/2017  Glucose 70 - 99 mg/dL 147(H) 127(H) 133(H)  BUN 8 - 23 mg/dL 12 12 13   Creatinine 0.61 - 1.24 mg/dL 0.72 0.48(L) 0.41(L)  Sodium 135 - 145 mmol/L 136 136 135  Potassium 3.5 - 5.1 mmol/L 4.3 4.0 3.8  Chloride 98 - 111 mmol/L 102 104 106  CO2 22 - 32 mmol/L 27 26 22   Calcium 8.9 - 10.3 mg/dL 9.0 8.5(L) 8.4(L)  Total Protein 6.5 - 8.1 g/dL 7.8 6.9 6.8  Total Bilirubin 0.3 - 1.2 mg/dL 4.5(HH) 2.0(H) 1.7(H)  Alkaline Phos 38 - 126 U/L 1,076(H) 548(H) 374(H)  AST 15 - 41 U/L 155(H) 59(H) 31  ALT 0 - 44 U/L 168(H) 46(H) 31      RADIOGRAPHIC STUDIES: I have personally reviewed the radiological images as listed and agreed with the findings in the report. No results found.   ASSESSMENT & PLAN:  Matthew Lewis is a 62 y.o. male with   1.metastatic adenocarcinoma to right adrenal gland, with multiple liverand lungmasses, retroperitoneal adenopathy, likelymetastatic cholangiocarcinoma -He was diagnosed in 10/2017. Given his stage IV disease, his cancer is not curable and surgery is not an option. His disease is still treatable.  -Due to his obstructive jaundice, he underwent percutaneous biliary drainage at his diagnosis.  He unfortunately has had multiple complications including recurrent infections, dehydration, required multiple hospitalization. -He underwent right-sided internal and external biliary drainage catheter exchange on January 05, 2018, the external catheter has been capped since then.  -Will start treatment tomorrow with first-line gemcitabine/cisplatin. Will reduce his first cycle given his performance status and hyperbilirubinemia.  -I plan to rescan him after 4 cycles. --the goal of chemo is palliative, to prolong his life and improve his quality of life. -f/u in 2 weeks before C1D8   2.Obstructive jaundice secondary to #1 -much improved after percutaneous drainage -presented more jaundiced today than recent  improvement.  --His total bilirubin has went from 2 to 4.5 today since the above procedure.  I discussed with interventional radiologist Dr. Kathlene Cote, and will put the external drain tube to gravity bag, to see if his bili will improve. If not, Dr. Kathlene Cote plan to repeat cholangiogram, to see if he needs external drainage of the left biliary duct.   3.Moderate protein and calorie malnutrition -Follow-up with dietitian -I again encouraged him to take nutritional supplement  4.Liver cirrhosis secondary to alcohol and NASH -Total bili at 4.5 today (12/17/17), LFTs overall much higher.   5. Portal vein thrombosis -He is on aspirin, no anticoagulation due to bloody biliary drainage   6. DM, HTN  -He is on  empagliflozin, glipizide, insulin, and metformin  -He is also on lisinopril for HTN, HTN is controlled, BP normal lately.  -f/u with PCP   7.H/o substance abuse and addiction  -He's been clean for 6 years now -will be cautious about narcotics and benzo.  8. Goal of care discussion, DNR/DNI  -We previously discussed code status in length. I recommend DNR/DNI, he agrees.  -He understands his cancer is not curable, and the goal of therapy is to prolong his life and improve his quality of life.    Plan -Labs reviewed, plan to start first cycle gemcitabine and cisplatin tomorrow, will reduce gemcitabine to 600 mg/m, due to his hyperbilirubinemia, and high risk of infection. -I discussed with interventional radiologist Dr. Kathlene Cote about his biliary drainage, will uncap his external drain to a gravity bag -will see him back next week before C1D8    No problem-specific Assessment & Plan notes found for this encounter.   No orders of the defined types were placed in this encounter.  All questions were answered. The patient knows to call the clinic with any problems, questions or concerns. No barriers to learning was detected. I spent 20 minutes counseling the patient  face to face. The total time spent in the appointment was 25 minutes and more than 50% was on counseling and review of test results     [name], MD 12/17/2017   I, Joslyn Devon, am acting as scribe for [name], MD.   I have reviewed the above documentation for accuracy and completeness, and I  agree with the above.

## 2017-12-17 NOTE — Progress Notes (Signed)
Met with patient and son(Zack) to introduce myself as Arboriculturist and to offer available resources.  Discussed one-time $25 Engineer, drilling as well as how expenses are covered. Provided patient with information needed from him to apply. He verbalized understanding.  Gave my card for any additional financial questions or concerns.

## 2017-12-18 ENCOUNTER — Encounter: Payer: Self-pay | Admitting: Hematology

## 2017-12-18 ENCOUNTER — Telehealth: Payer: Self-pay

## 2017-12-18 ENCOUNTER — Inpatient Hospital Stay: Payer: Medicare Other | Admitting: Nutrition

## 2017-12-18 ENCOUNTER — Other Ambulatory Visit: Payer: Medicare Other

## 2017-12-18 ENCOUNTER — Inpatient Hospital Stay: Payer: Medicare Other

## 2017-12-18 VITALS — BP 102/73 | HR 93 | Temp 97.7°F | Resp 18

## 2017-12-18 DIAGNOSIS — E119 Type 2 diabetes mellitus without complications: Secondary | ICD-10-CM | POA: Diagnosis not present

## 2017-12-18 DIAGNOSIS — C221 Intrahepatic bile duct carcinoma: Secondary | ICD-10-CM | POA: Diagnosis not present

## 2017-12-18 DIAGNOSIS — C7970 Secondary malignant neoplasm of unspecified adrenal gland: Secondary | ICD-10-CM | POA: Diagnosis not present

## 2017-12-18 DIAGNOSIS — Z7189 Other specified counseling: Secondary | ICD-10-CM

## 2017-12-18 DIAGNOSIS — Z5111 Encounter for antineoplastic chemotherapy: Secondary | ICD-10-CM | POA: Diagnosis not present

## 2017-12-18 DIAGNOSIS — Z66 Do not resuscitate: Secondary | ICD-10-CM | POA: Diagnosis not present

## 2017-12-18 LAB — CANCER ANTIGEN 19-9: CA 19-9: 343 U/mL — ABNORMAL HIGH (ref 0–35)

## 2017-12-18 MED ORDER — SODIUM CHLORIDE 0.9 % IV SOLN
Freq: Once | INTRAVENOUS | Status: AC
  Administered 2017-12-18: 09:00:00 via INTRAVENOUS
  Filled 2017-12-18: qty 250

## 2017-12-18 MED ORDER — ERTAPENEM SODIUM 1 G IJ SOLR: 1.0000 g | Freq: Once | INTRAMUSCULAR | Status: DC

## 2017-12-18 MED ORDER — SODIUM CHLORIDE 0.9 % IV SOLN
600.0000 mg/m2 | Freq: Once | INTRAVENOUS | Status: AC
  Administered 2017-12-18: 1406 mg via INTRAVENOUS
  Filled 2017-12-18: qty 36.98

## 2017-12-18 MED ORDER — SODIUM CHLORIDE 0.9% FLUSH
10.0000 mL | INTRAVENOUS | Status: DC | PRN
  Administered 2017-12-18: 10 mL
  Filled 2017-12-18: qty 10

## 2017-12-18 MED ORDER — SODIUM CHLORIDE 0.9 % IV SOLN
Freq: Once | INTRAVENOUS | Status: AC
  Administered 2017-12-18: 12:00:00 via INTRAVENOUS
  Filled 2017-12-18: qty 5

## 2017-12-18 MED ORDER — PALONOSETRON HCL INJECTION 0.25 MG/5ML
0.2500 mg | Freq: Once | INTRAVENOUS | Status: AC
  Administered 2017-12-18: 0.25 mg via INTRAVENOUS

## 2017-12-18 MED ORDER — HEPARIN SOD (PORK) LOCK FLUSH 100 UNIT/ML IV SOLN
500.0000 [IU] | Freq: Once | INTRAVENOUS | Status: AC | PRN
  Administered 2017-12-18: 500 [IU]
  Filled 2017-12-18: qty 5

## 2017-12-18 MED ORDER — SODIUM CHLORIDE 0.9 % IV SOLN
25.0000 mg/m2 | Freq: Once | INTRAVENOUS | Status: AC
  Administered 2017-12-18: 58 mg via INTRAVENOUS
  Filled 2017-12-18: qty 58

## 2017-12-18 MED ORDER — SODIUM CHLORIDE 0.9 % IV SOLN
1.0000 g | Freq: Once | INTRAVENOUS | Status: AC
  Administered 2017-12-18: 1000 mg via INTRAVENOUS
  Filled 2017-12-18: qty 1

## 2017-12-18 MED ORDER — PALONOSETRON HCL INJECTION 0.25 MG/5ML
INTRAVENOUS | Status: AC
  Filled 2017-12-18: qty 5

## 2017-12-18 MED ORDER — SODIUM CHLORIDE 0.9 % IV SOLN
INTRAVENOUS | Status: DC
  Administered 2017-12-18: 11:00:00 via INTRAVENOUS
  Filled 2017-12-18: qty 250

## 2017-12-18 MED ORDER — POTASSIUM CHLORIDE 2 MEQ/ML IV SOLN
Freq: Once | INTRAVENOUS | Status: AC
  Administered 2017-12-18: 09:00:00 via INTRAVENOUS
  Filled 2017-12-18: qty 10

## 2017-12-18 NOTE — Progress Notes (Signed)
Nutrition follow-up completed with patient receiving chemotherapy for metastatic right adrenal gland cancer. Patient was recently discharged from the hospital. He was diagnosed with non-severe malnutrition in the context of chronic illness. Weight improved and documented as 208.3 pounds December 11 increased from 198.2 pounds November 25. He is eating a regular diet without difficulty. He enjoys boost.  States he lost the coupons I provided to him on a previous visit.  Nutrition diagnosis: Unintended weight loss improved.  Intervention: Educated patient to consume smaller more frequent meals and snacks. Encouraged him to increase oral nutrition supplements if needed.  Provided additional coupons. Provided supportive listening.  Monitoring, evaluation, goals: Patient will tolerate adequate calories and protein to minimize weight loss through treatment.  Next visit: To be scheduled as needed.  **Disclaimer: This note was dictated with voice recognition software. Similar sounding words can inadvertently be transcribed and this note may contain transcription errors which may not have been corrected upon publication of note.**

## 2017-12-18 NOTE — Telephone Encounter (Signed)
Faxed signed orders back to [place], sent to HIM for scanning to chart.

## 2017-12-18 NOTE — Patient Instructions (Signed)
[facility] Discharge Instructions for Patients Receiving Chemotherapy  Today you received the following chemotherapy agents gemctiabine (Gemzar) and cisplatin (Platinol)  To help prevent nausea and vomiting after your treatment, we encourage you to take your nausea medication as directed by your doctor.   If you develop nausea and vomiting that is not controlled by your nausea medication, call the clinic.   BELOW ARE SYMPTOMS THAT SHOULD BE REPORTED IMMEDIATELY:  *FEVER GREATER THAN 100.5 F  *CHILLS WITH OR WITHOUT FEVER  NAUSEA AND VOMITING THAT IS NOT CONTROLLED WITH YOUR NAUSEA MEDICATION  *UNUSUAL SHORTNESS OF BREATH  *UNUSUAL BRUISING OR BLEEDING  TENDERNESS IN MOUTH AND THROAT WITH OR WITHOUT PRESENCE OF ULCERS  *URINARY PROBLEMS  *BOWEL PROBLEMS  UNUSUAL RASH Items with * indicate a potential emergency and should be followed up as soon as possible.  Feel free to call the clinic should you have any questions or concerns. The clinic phone number is [phone].  Please show the [place] at check-in to the Emergency Department and triage nurse.  Gemcitabine injection What is this medicine? GEMCITABINE (jem SIT a been) is a chemotherapy drug. This medicine is used to treat many types of cancer like breast cancer, lung cancer, pancreatic cancer, and ovarian cancer. This medicine may be used for other purposes; ask your health care provider or pharmacist if you have questions. COMMON BRAND NAME(S): Gemzar What should I tell my health care provider before I take this medicine? They need to know if you have any of these conditions: -blood disorders -infection -kidney disease -liver disease -recent or ongoing radiation therapy -an unusual or allergic reaction to gemcitabine, other chemotherapy, other medicines, foods, dyes, or preservatives -pregnant or trying to get pregnant -breast-feeding How should I use this medicine? This drug is given  as an infusion into a vein. It is administered in a hospital or clinic by a specially trained health care professional. Talk to your pediatrician regarding the use of this medicine in children. Special care may be needed. Overdosage: If you think you have taken too much of this medicine contact a poison control center or emergency room at once. NOTE: This medicine is only for you. Do not share this medicine with others. What if I miss a dose? It is important not to miss your dose. Call your doctor or health care professional if you are unable to keep an appointment. What may interact with this medicine? -medicines to increase blood counts like filgrastim, pegfilgrastim, sargramostim -some other chemotherapy drugs like cisplatin -vaccines Talk to your doctor or health care professional before taking any of these medicines: -acetaminophen -aspirin -ibuprofen -ketoprofen -naproxen This list may not describe all possible interactions. Give your health care provider a list of all the medicines, herbs, non-prescription drugs, or dietary supplements you use. Also tell them if you smoke, drink alcohol, or use illegal drugs. Some items may interact with your medicine. What should I watch for while using this medicine? Visit your doctor for checks on your progress. This drug may make you feel generally unwell. This is not uncommon, as chemotherapy can affect healthy cells as well as cancer cells. Report any side effects. Continue your course of treatment even though you feel ill unless your doctor tells you to stop. In some cases, you may be given additional medicines to help with side effects. Follow all directions for their use. Call your doctor or health care professional for advice if you get a fever, chills or sore throat,  or other symptoms of a cold or flu. Do not treat yourself. This drug decreases your body's ability to fight infections. Try to avoid being around people who are sick. This medicine  may increase your risk to bruise or bleed. Call your doctor or health care professional if you notice any unusual bleeding. Be careful brushing and flossing your teeth or using a toothpick because you may get an infection or bleed more easily. If you have any dental work done, tell your dentist you are receiving this medicine. Avoid taking products that contain aspirin, acetaminophen, ibuprofen, naproxen, or ketoprofen unless instructed by your doctor. These medicines may hide a fever. Women should inform their doctor if they wish to become pregnant or think they might be pregnant. There is a potential for serious side effects to an unborn child. Talk to your health care professional or pharmacist for more information. Do not breast-feed an infant while taking this medicine. What side effects may I notice from receiving this medicine? Side effects that you should report to your doctor or health care professional as soon as possible: -allergic reactions like skin rash, itching or hives, swelling of the face, lips, or tongue -low blood counts - this medicine may decrease the number of white blood cells, red blood cells and platelets. You may be at increased risk for infections and bleeding. -signs of infection - fever or chills, cough, sore throat, pain or difficulty passing urine -signs of decreased platelets or bleeding - bruising, pinpoint red spots on the skin, black, tarry stools, blood in the urine -signs of decreased red blood cells - unusually weak or tired, fainting spells, lightheadedness -breathing problems -chest pain -mouth sores -nausea and vomiting -pain, swelling, redness at site where injected -pain, tingling, numbness in the hands or feet -stomach pain -swelling of ankles, feet, hands -unusual bleeding Side effects that usually do not require medical attention (report to your doctor or health care professional if they continue or are bothersome): -constipation -diarrhea -hair  loss -loss of appetite -stomach upset This list may not describe all possible side effects. Call your doctor for medical advice about side effects. You may report side effects to FDA at 1-800-FDA-1088. Where should I keep my medicine? This drug is given in a hospital or clinic and will not be stored at home. NOTE: This sheet is a summary. It may not cover all possible information. If you have questions about this medicine, talk to your doctor, pharmacist, or health care provider.  2018 [employer]/Gold Standard (2007-05-05 18:45:54)  Cisplatin injection What is this medicine? CISPLATIN (SIS pla tin) is a chemotherapy drug. It targets fast dividing cells, like cancer cells, and causes these cells to die. This medicine is used to treat many types of cancer like bladder, ovarian, and testicular cancers. This medicine may be used for other purposes; ask your health care provider or pharmacist if you have questions. COMMON BRAND NAME(S): Platinol, Platinol -AQ What should I tell my health care provider before I take this medicine? They need to know if you have any of these conditions: -blood disorders -hearing problems -kidney disease -recent or ongoing radiation therapy -an unusual or allergic reaction to cisplatin, carboplatin, other chemotherapy, other medicines, foods, dyes, or preservatives -pregnant or trying to get pregnant -breast-feeding How should I use this medicine? This drug is given as an infusion into a vein. It is administered in a hospital or clinic by a specially trained health care professional. Talk to your pediatrician regarding the use of this  medicine in children. Special care may be needed. Overdosage: If you think you have taken too much of this medicine contact a poison control center or emergency room at once. NOTE: This medicine is only for you. Do not share this medicine with others. What if I miss a dose? It is important not to miss a dose. Call your doctor or  health care professional if you are unable to keep an appointment. What may interact with this medicine? -dofetilide -foscarnet -medicines for seizures -medicines to increase blood counts like filgrastim, pegfilgrastim, sargramostim -probenecid -pyridoxine used with altretamine -rituximab -some antibiotics like amikacin, gentamicin, neomycin, polymyxin B, streptomycin, tobramycin -sulfinpyrazone -vaccines -zalcitabine Talk to your doctor or health care professional before taking any of these medicines: -acetaminophen -aspirin -ibuprofen -ketoprofen -naproxen This list may not describe all possible interactions. Give your health care provider a list of all the medicines, herbs, non-prescription drugs, or dietary supplements you use. Also tell them if you smoke, drink alcohol, or use illegal drugs. Some items may interact with your medicine. What should I watch for while using this medicine? Your condition will be monitored carefully while you are receiving this medicine. You will need important blood work done while you are taking this medicine. This drug may make you feel generally unwell. This is not uncommon, as chemotherapy can affect healthy cells as well as cancer cells. Report any side effects. Continue your course of treatment even though you feel ill unless your doctor tells you to stop. In some cases, you may be given additional medicines to help with side effects. Follow all directions for their use. Call your doctor or health care professional for advice if you get a fever, chills or sore throat, or other symptoms of a cold or flu. Do not treat yourself. This drug decreases your body's ability to fight infections. Try to avoid being around people who are sick. This medicine may increase your risk to bruise or bleed. Call your doctor or health care professional if you notice any unusual bleeding. Be careful brushing and flossing your teeth or using a toothpick because you may get  an infection or bleed more easily. If you have any dental work done, tell your dentist you are receiving this medicine. Avoid taking products that contain aspirin, acetaminophen, ibuprofen, naproxen, or ketoprofen unless instructed by your doctor. These medicines may hide a fever. Do not become pregnant while taking this medicine. Women should inform their doctor if they wish to become pregnant or think they might be pregnant. There is a potential for serious side effects to an unborn child. Talk to your health care professional or pharmacist for more information. Do not breast-feed an infant while taking this medicine. Drink fluids as directed while you are taking this medicine. This will help protect your kidneys. Call your doctor or health care professional if you get diarrhea. Do not treat yourself. What side effects may I notice from receiving this medicine? Side effects that you should report to your doctor or health care professional as soon as possible: -allergic reactions like skin rash, itching or hives, swelling of the face, lips, or tongue -signs of infection - fever or chills, cough, sore throat, pain or difficulty passing urine -signs of decreased platelets or bleeding - bruising, pinpoint red spots on the skin, black, tarry stools, nosebleeds -signs of decreased red blood cells - unusually weak or tired, fainting spells, lightheadedness -breathing problems -changes in hearing -gout pain -low blood counts - This drug may decrease the number  of white blood cells, red blood cells and platelets. You may be at increased risk for infections and bleeding. -nausea and vomiting -pain, swelling, redness or irritation at the injection site -pain, tingling, numbness in the hands or feet -problems with balance, movement -trouble passing urine or change in the amount of urine Side effects that usually do not require medical attention (report to your doctor or health care professional if they  continue or are bothersome): -changes in vision -loss of appetite -metallic taste in the mouth or changes in taste This list may not describe all possible side effects. Call your doctor for medical advice about side effects. You may report side effects to FDA at 1-800-FDA-1088. Where should I keep my medicine? This drug is given in a hospital or clinic and will not be stored at home. NOTE: This sheet is a summary. It may not cover all possible information. If you have questions about this medicine, talk to your doctor, pharmacist, or health care provider.  2018 Elsevier/Gold Standard (2007-03-31 14:40:54)

## 2017-12-18 NOTE — Progress Notes (Signed)
[name] ok to tx with ALT 155, AST 168, and bili 4.5. Reduced dose chemo communicated by [name] to pharmacy yesterday.  Noted blood leakage around percutaneous drainage catheter upon arrival. [name] chairside for observation and assessment. Site rinsed with sterile water and redressed. Nurse from IR flushed catheter and placed drainage bag per order from [name]. Pt aware of how to care for drainage bag d/t recent experience.  Ertapenem antibiotic day 7 out of 7 home medication. Per [name] to be given before, during, or after post-cisplatin fluids. Per pharmacy, no research regarding ertapenem compatibility with electrolytes. Encouraged not to give simultaneously. Home IVPB cannot be given per instructions of nursing director and pharmacy. Pt ok to have new medication made by inpatient pharmacy to be administered outpatient so that port could be deaccessed today. Pt ok to have identical ertapenem home dose disposed of.

## 2017-12-22 ENCOUNTER — Inpatient Hospital Stay (HOSPITAL_COMMUNITY)
Admission: EM | Admit: 2017-12-22 | Discharge: 2017-12-30 | DRG: 640 | Disposition: A | Discharge status: Home Health | Payer: Medicare Other | Attending: Internal Medicine | Admitting: Internal Medicine

## 2017-12-22 ENCOUNTER — Emergency Department (HOSPITAL_COMMUNITY): Payer: Medicare Other

## 2017-12-22 ENCOUNTER — Other Ambulatory Visit: Payer: Self-pay

## 2017-12-22 ENCOUNTER — Encounter (HOSPITAL_COMMUNITY): Payer: Self-pay | Admitting: Emergency Medicine

## 2017-12-22 DIAGNOSIS — E876 Hypokalemia: Secondary | ICD-10-CM | POA: Diagnosis not present

## 2017-12-22 DIAGNOSIS — E785 Hyperlipidemia, unspecified: Secondary | ICD-10-CM | POA: Diagnosis not present

## 2017-12-22 DIAGNOSIS — Z85038 Personal history of other malignant neoplasm of large intestine: Secondary | ICD-10-CM

## 2017-12-22 DIAGNOSIS — R740 Nonspecific elevation of levels of transaminase and lactic acid dehydrogenase [LDH]: Secondary | ICD-10-CM | POA: Diagnosis not present

## 2017-12-22 DIAGNOSIS — D638 Anemia in other chronic diseases classified elsewhere: Secondary | ICD-10-CM | POA: Diagnosis present

## 2017-12-22 DIAGNOSIS — E1142 Type 2 diabetes mellitus with diabetic polyneuropathy: Secondary | ICD-10-CM | POA: Diagnosis not present

## 2017-12-22 DIAGNOSIS — K5900 Constipation, unspecified: Secondary | ICD-10-CM | POA: Diagnosis present

## 2017-12-22 DIAGNOSIS — E46 Unspecified protein-calorie malnutrition: Secondary | ICD-10-CM | POA: Diagnosis not present

## 2017-12-22 DIAGNOSIS — K7030 Alcoholic cirrhosis of liver without ascites: Secondary | ICD-10-CM | POA: Diagnosis present

## 2017-12-22 DIAGNOSIS — Z6829 Body mass index (BMI) 29.0-29.9, adult: Secondary | ICD-10-CM

## 2017-12-22 DIAGNOSIS — C3490 Malignant neoplasm of unspecified part of unspecified bronchus or lung: Secondary | ICD-10-CM | POA: Diagnosis not present

## 2017-12-22 DIAGNOSIS — Z9861 Coronary angioplasty status: Secondary | ICD-10-CM

## 2017-12-22 DIAGNOSIS — K831 Obstruction of bile duct: Secondary | ICD-10-CM

## 2017-12-22 DIAGNOSIS — R338 Other retention of urine: Secondary | ICD-10-CM | POA: Diagnosis present

## 2017-12-22 DIAGNOSIS — Z7189 Other specified counseling: Secondary | ICD-10-CM | POA: Diagnosis not present

## 2017-12-22 DIAGNOSIS — R195 Other fecal abnormalities: Secondary | ICD-10-CM | POA: Diagnosis not present

## 2017-12-22 DIAGNOSIS — Z9049 Acquired absence of other specified parts of digestive tract: Secondary | ICD-10-CM

## 2017-12-22 DIAGNOSIS — Z66 Do not resuscitate: Secondary | ICD-10-CM | POA: Diagnosis not present

## 2017-12-22 DIAGNOSIS — B9620 Unspecified Escherichia coli [E. coli] as the cause of diseases classified elsewhere: Secondary | ICD-10-CM | POA: Diagnosis present

## 2017-12-22 DIAGNOSIS — E119 Type 2 diabetes mellitus without complications: Secondary | ICD-10-CM | POA: Diagnosis not present

## 2017-12-22 DIAGNOSIS — E860 Dehydration: Secondary | ICD-10-CM | POA: Diagnosis not present

## 2017-12-22 DIAGNOSIS — I10 Essential (primary) hypertension: Secondary | ICD-10-CM | POA: Diagnosis present

## 2017-12-22 DIAGNOSIS — R7881 Bacteremia: Secondary | ICD-10-CM | POA: Diagnosis present

## 2017-12-22 DIAGNOSIS — C7970 Secondary malignant neoplasm of unspecified adrenal gland: Secondary | ICD-10-CM | POA: Diagnosis present

## 2017-12-22 DIAGNOSIS — C221 Intrahepatic bile duct carcinoma: Secondary | ICD-10-CM | POA: Diagnosis not present

## 2017-12-22 DIAGNOSIS — R300 Dysuria: Secondary | ICD-10-CM | POA: Diagnosis not present

## 2017-12-22 DIAGNOSIS — R5381 Other malaise: Secondary | ICD-10-CM

## 2017-12-22 DIAGNOSIS — Z794 Long term (current) use of insulin: Secondary | ICD-10-CM | POA: Diagnosis not present

## 2017-12-22 DIAGNOSIS — Z515 Encounter for palliative care: Secondary | ICD-10-CM | POA: Diagnosis not present

## 2017-12-22 DIAGNOSIS — N320 Bladder-neck obstruction: Secondary | ICD-10-CM

## 2017-12-22 DIAGNOSIS — C801 Malignant (primary) neoplasm, unspecified: Secondary | ICD-10-CM | POA: Diagnosis not present

## 2017-12-22 DIAGNOSIS — I2510 Atherosclerotic heart disease of native coronary artery without angina pectoris: Secondary | ICD-10-CM | POA: Diagnosis present

## 2017-12-22 DIAGNOSIS — T451X5A Adverse effect of antineoplastic and immunosuppressive drugs, initial encounter: Secondary | ICD-10-CM | POA: Diagnosis not present

## 2017-12-22 DIAGNOSIS — Z7982 Long term (current) use of aspirin: Secondary | ICD-10-CM

## 2017-12-22 DIAGNOSIS — N401 Enlarged prostate with lower urinary tract symptoms: Secondary | ICD-10-CM | POA: Diagnosis present

## 2017-12-22 DIAGNOSIS — C787 Secondary malignant neoplasm of liver and intrahepatic bile duct: Secondary | ICD-10-CM | POA: Diagnosis present

## 2017-12-22 DIAGNOSIS — C229 Malignant neoplasm of liver, not specified as primary or secondary: Secondary | ICD-10-CM

## 2017-12-22 DIAGNOSIS — Z8249 Family history of ischemic heart disease and other diseases of the circulatory system: Secondary | ICD-10-CM

## 2017-12-22 DIAGNOSIS — Z825 Family history of asthma and other chronic lower respiratory diseases: Secondary | ICD-10-CM

## 2017-12-22 DIAGNOSIS — E440 Moderate protein-calorie malnutrition: Secondary | ICD-10-CM | POA: Diagnosis not present

## 2017-12-22 DIAGNOSIS — F4310 Post-traumatic stress disorder, unspecified: Secondary | ICD-10-CM | POA: Diagnosis present

## 2017-12-22 DIAGNOSIS — Z87891 Personal history of nicotine dependence: Secondary | ICD-10-CM

## 2017-12-22 DIAGNOSIS — R161 Splenomegaly, not elsewhere classified: Secondary | ICD-10-CM | POA: Diagnosis present

## 2017-12-22 DIAGNOSIS — Z79899 Other long term (current) drug therapy: Secondary | ICD-10-CM

## 2017-12-22 DIAGNOSIS — Z981 Arthrodesis status: Secondary | ICD-10-CM

## 2017-12-22 DIAGNOSIS — R7401 Elevation of levels of liver transaminase levels: Secondary | ICD-10-CM | POA: Diagnosis present

## 2017-12-22 DIAGNOSIS — Z95828 Presence of other vascular implants and grafts: Secondary | ICD-10-CM

## 2017-12-22 DIAGNOSIS — Z885 Allergy status to narcotic agent status: Secondary | ICD-10-CM

## 2017-12-22 LAB — IRON AND TIBC
Iron: 91 ug/dL (ref 45–182)
Saturation Ratios: 32 % (ref 17.9–39.5)
TIBC: 287 ug/dL (ref 250–450)
UIBC: 196 ug/dL

## 2017-12-22 LAB — COMPREHENSIVE METABOLIC PANEL
ALT: 143 U/L — ABNORMAL HIGH (ref 0–44)
ANION GAP: 8 (ref 5–15)
AST: 120 U/L — ABNORMAL HIGH (ref 15–41)
Albumin: 2.9 g/dL — ABNORMAL LOW (ref 3.5–5.0)
Alkaline Phosphatase: 525 U/L — ABNORMAL HIGH (ref 38–126)
BUN: 19 mg/dL (ref 8–23)
CO2: 26 mmol/L (ref 22–32)
Calcium: 9 mg/dL (ref 8.9–10.3)
Chloride: 98 mmol/L (ref 98–111)
Creatinine, Ser: 0.54 mg/dL — ABNORMAL LOW (ref 0.61–1.24)
GFR calc Af Amer: >60 mL/min (ref >60)
GFR calc non Af Amer: >60 mL/min (ref >60)
Glucose, Bld: 163 mg/dL — ABNORMAL HIGH (ref 70–99)
Potassium: 4 mmol/L (ref 3.5–5.1)
Sodium: 132 mmol/L — ABNORMAL LOW (ref 135–145)
Total Bilirubin: 4.8 mg/dL — ABNORMAL HIGH (ref 0.3–1.2)
Total Protein: 8 g/dL (ref 6.5–8.1)

## 2017-12-22 LAB — FOLATE: Folate: 25.2 ng/mL (ref >5.9)

## 2017-12-22 LAB — CBC WITH DIFFERENTIAL/PLATELET
Abs Immature Granulocytes: 0.03 10*3/uL (ref 0.00–0.07)
Basophils Absolute: 0 10*3/uL (ref 0.0–0.1)
Basophils Relative: 1 %
EOS ABS: 0.5 10*3/uL (ref 0.0–0.5)
Eosinophils Relative: 7 %
HCT: 30.1 % — ABNORMAL LOW (ref 39.0–52.0)
Hemoglobin: 9.4 g/dL — ABNORMAL LOW (ref 13.0–17.0)
Immature Granulocytes: 1 %
Lymphocytes Relative: 12 %
Lymphs Abs: 0.8 10*3/uL (ref 0.7–4.0)
MCH: 29.4 pg (ref 26.0–34.0)
MCHC: 31.2 g/dL (ref 30.0–36.0)
MCV: 94.1 fL (ref 80.0–100.0)
Monocytes Absolute: 0.1 10*3/uL (ref 0.1–1.0)
Monocytes Relative: 2 %
Neutro Abs: 5 10*3/uL (ref 1.7–7.7)
Neutrophils Relative %: 77 %
Platelets: 300 10*3/uL (ref 150–400)
RBC: 3.2 MIL/uL — ABNORMAL LOW (ref 4.22–5.81)
RDW: 18.6 % — ABNORMAL HIGH (ref 11.5–15.5)
WBC: 6.5 10*3/uL (ref 4.0–10.5)
nRBC: 0 % (ref 0.0–0.2)

## 2017-12-22 LAB — I-STAT CHEM 8, ED
BUN: 16 mg/dL (ref 8–23)
CALCIUM ION: 1.18 mmol/L (ref 1.15–1.40)
CHLORIDE: 102 mmol/L (ref 98–111)
Creatinine, Ser: 0.4 mg/dL — ABNORMAL LOW (ref 0.61–1.24)
Glucose, Bld: 144 mg/dL — ABNORMAL HIGH (ref 70–99)
HCT: 28 % — ABNORMAL LOW (ref 39.0–52.0)
Hemoglobin: 9.5 g/dL — ABNORMAL LOW (ref 13.0–17.0)
Potassium: 4.2 mmol/L (ref 3.5–5.1)
Sodium: 135 mmol/L (ref 135–145)
TCO2: 27 mmol/L (ref 22–32)

## 2017-12-22 LAB — URINALYSIS, ROUTINE W REFLEX MICROSCOPIC
Glucose, UA: NEGATIVE mg/dL
HGB URINE DIPSTICK: NEGATIVE
Ketones, ur: NEGATIVE mg/dL
Leukocytes, UA: NEGATIVE
Nitrite: NEGATIVE
Protein, ur: NEGATIVE mg/dL
Specific Gravity, Urine: 1.01 (ref 1.005–1.030)
pH: 6.5 (ref 5.0–8.0)

## 2017-12-22 LAB — RETICULOCYTES
Immature Retic Fract: 3.3 % (ref 2.3–15.9)
RBC.: 3.13 MIL/uL — ABNORMAL LOW (ref 4.22–5.81)
Retic Count, Absolute: 15.3 10*3/uL — ABNORMAL LOW (ref 19.0–186.0)
Retic Ct Pct: 0.5 % (ref 0.4–3.1)

## 2017-12-22 LAB — PROTIME-INR
INR: 1.15
Prothrombin Time: 14.6 seconds (ref 11.4–15.2)

## 2017-12-22 LAB — LIPASE, BLOOD: Lipase: 26 U/L (ref 11–51)

## 2017-12-22 LAB — VITAMIN B12: Vitamin B-12: 624 pg/mL (ref 180–914)

## 2017-12-22 LAB — FERRITIN: Ferritin: 332 ng/mL (ref 24–336)

## 2017-12-22 LAB — MAGNESIUM: Magnesium: 1.9 mg/dL (ref 1.7–2.4)

## 2017-12-22 LAB — GLUCOSE, CAPILLARY: Glucose-Capillary: 176 mg/dL — ABNORMAL HIGH (ref 70–99)

## 2017-12-22 MED ORDER — CELECOXIB 200 MG PO CAPS
200.0000 mg | ORAL_CAPSULE | Freq: Every day | ORAL | Status: DC
  Administered 2017-12-22 – 2017-12-29 (×8): 200 mg via ORAL
  Filled 2017-12-22 (×8): qty 1

## 2017-12-22 MED ORDER — ASPIRIN EC 81 MG PO TBEC
81.0000 mg | DELAYED_RELEASE_TABLET | Freq: Every day | ORAL | Status: DC
  Administered 2017-12-22 – 2017-12-30 (×9): 81 mg via ORAL
  Filled 2017-12-22 (×9): qty 1

## 2017-12-22 MED ORDER — SORBITOL 70 % SOLN
30.0000 mL | Freq: Every day | Status: DC | PRN
  Filled 2017-12-22: qty 30

## 2017-12-22 MED ORDER — SODIUM CHLORIDE 0.9% FLUSH
3.0000 mL | Freq: Two times a day (BID) | INTRAVENOUS | Status: DC
  Administered 2017-12-25 – 2017-12-29 (×5): 3 mL via INTRAVENOUS

## 2017-12-22 MED ORDER — ENOXAPARIN SODIUM 40 MG/0.4ML ~~LOC~~ SOLN
40.0000 mg | SUBCUTANEOUS | Status: DC
  Administered 2017-12-23 – 2017-12-29 (×6): 40 mg via SUBCUTANEOUS
  Filled 2017-12-22 (×8): qty 0.4

## 2017-12-22 MED ORDER — SODIUM CHLORIDE 0.9 % IV BOLUS
1000.0000 mL | Freq: Once | INTRAVENOUS | Status: AC
  Administered 2017-12-22: 1000 mL via INTRAVENOUS

## 2017-12-22 MED ORDER — ENSURE ENLIVE PO LIQD
237.0000 mL | Freq: Three times a day (TID) | ORAL | Status: DC
  Administered 2017-12-24: 237 mL via ORAL
  Filled 2017-12-22: qty 237

## 2017-12-22 MED ORDER — SENNOSIDES-DOCUSATE SODIUM 8.6-50 MG PO TABS
1.0000 | ORAL_TABLET | Freq: Two times a day (BID) | ORAL | Status: DC
  Administered 2017-12-22 – 2017-12-30 (×14): 1 via ORAL
  Filled 2017-12-22 (×16): qty 1

## 2017-12-22 MED ORDER — LORATADINE 10 MG PO TABS
10.0000 mg | ORAL_TABLET | Freq: Every day | ORAL | Status: DC
  Administered 2017-12-22 – 2017-12-30 (×9): 10 mg via ORAL
  Filled 2017-12-22 (×9): qty 1

## 2017-12-22 MED ORDER — SODIUM CHLORIDE 0.9 % IV SOLN
INTRAVENOUS | Status: DC
  Administered 2017-12-22 – 2017-12-25 (×4): via INTRAVENOUS

## 2017-12-22 MED ORDER — ONDANSETRON HCL 4 MG PO TABS: 4.0000 mg | ORAL_TABLET | Freq: Four times a day (QID) | ORAL | Status: DC | PRN

## 2017-12-22 MED ORDER — ONDANSETRON HCL 4 MG/2ML IJ SOLN
4.0000 mg | Freq: Four times a day (QID) | INTRAMUSCULAR | Status: DC | PRN
  Administered 2017-12-23 – 2017-12-26 (×3): 4 mg via INTRAVENOUS
  Filled 2017-12-22 (×4): qty 2

## 2017-12-22 MED ORDER — SODIUM CHLORIDE (PF) 0.9 % IJ SOLN
INTRAMUSCULAR | Status: AC
  Administered 2017-12-22: 20:00:00 via INTRAVENOUS
  Filled 2017-12-22: qty 50

## 2017-12-22 MED ORDER — OXYCODONE HCL 5 MG PO TABS
5.0000 mg | ORAL_TABLET | Freq: Four times a day (QID) | ORAL | Status: DC | PRN
  Administered 2017-12-22: 5 mg via ORAL
  Filled 2017-12-22: qty 1

## 2017-12-22 MED ORDER — GABAPENTIN 400 MG PO CAPS
800.0000 mg | ORAL_CAPSULE | Freq: Every day | ORAL | Status: DC
  Administered 2017-12-22 – 2017-12-29 (×8): 800 mg via ORAL
  Filled 2017-12-22 (×8): qty 2

## 2017-12-22 MED ORDER — TRAMADOL HCL 50 MG PO TABS
100.0000 mg | ORAL_TABLET | Freq: Four times a day (QID) | ORAL | Status: DC | PRN
  Administered 2017-12-22 – 2017-12-25 (×6): 100 mg via ORAL
  Filled 2017-12-22 (×6): qty 2

## 2017-12-22 MED ORDER — GABAPENTIN 400 MG PO CAPS
400.0000 mg | ORAL_CAPSULE | Freq: Every day | ORAL | Status: DC
  Administered 2017-12-23 – 2017-12-30 (×8): 400 mg via ORAL
  Filled 2017-12-22 (×8): qty 1

## 2017-12-22 MED ORDER — IOPAMIDOL (ISOVUE-300) INJECTION 61%
INTRAVENOUS | Status: AC
  Filled 2017-12-22: qty 100

## 2017-12-22 MED ORDER — ALBUTEROL SULFATE (2.5 MG/3ML) 0.083% IN NEBU: 2.5000 mg | INHALATION_SOLUTION | RESPIRATORY_TRACT | Status: DC | PRN

## 2017-12-22 MED ORDER — POLYETHYLENE GLYCOL 3350 17 G PO PACK
17.0000 g | PACK | Freq: Two times a day (BID) | ORAL | Status: DC
  Administered 2017-12-25 – 2017-12-29 (×4): 17 g via ORAL
  Filled 2017-12-22 (×9): qty 1

## 2017-12-22 MED ORDER — ADULT MULTIVITAMIN W/MINERALS CH
1.0000 | ORAL_TABLET | Freq: Every day | ORAL | Status: DC
  Administered 2017-12-22 – 2017-12-30 (×9): 1 via ORAL
  Filled 2017-12-22 (×9): qty 1

## 2017-12-22 MED ORDER — GABAPENTIN 400 MG PO CAPS: 400.0000 mg | ORAL_CAPSULE | ORAL | Status: DC

## 2017-12-22 MED ORDER — IPRATROPIUM BROMIDE 0.02 % IN SOLN: 0.5000 mg | RESPIRATORY_TRACT | Status: DC | PRN

## 2017-12-22 MED ORDER — IOPAMIDOL (ISOVUE-300) INJECTION 61%
100.0000 mL | Freq: Once | INTRAVENOUS | Status: AC | PRN
  Administered 2017-12-22: 100 mL via INTRAVENOUS

## 2017-12-22 NOTE — ED Triage Notes (Signed)
Patient arrived from home. Patient c/o difficulty with urination and also some constipation.. Patient stated he is not able to urinate. Patient had Stage IV liver, bile duct, and lung cancer. Patient stated that he was told that if he started not being able to urinate he needed to come to the ED.

## 2017-12-22 NOTE — ED Notes (Signed)
Patient transported to CT 

## 2017-12-22 NOTE — ED Notes (Signed)
ED TO INPATIENT HANDOFF REPORT  Name/Age/Gender Matthew Lewis 62 y.o. male  Code Status    Code Status Orders  (From admission, onward)         Start     Ordered   12/22/17 1641  Do not attempt resuscitation (DNR)  Continuous    Question Answer Comment  In the event of cardiac or respiratory ARREST Do not call a "code blue"   In the event of cardiac or respiratory ARREST Do not perform Intubation, CPR, defibrillation or ACLS   In the event of cardiac or respiratory ARREST Use medication by any route, position, wound care, and other measures to relive pain and suffering. May use oxygen, suction and manual treatment of airway obstruction as needed for comfort.      12/22/17 1644        Code Status History    Date Active Date Inactive Code Status Order ID Comments User Context   12/03/2017 1153 12/11/2017 1659 DNR [id number]  Desiree Hane, MD Inpatient   11/21/2017 1605 11/24/2017 1856 DNR [id number]  Alma Friendly, MD Inpatient   11/20/2017 1931 11/21/2017 1605 Full Code [id number]  Yaakov Guthrie, MD ED   11/09/2017 0733 11/11/2017 1844 DNR [id number]  Toy Baker, MD ED   10/23/2017 2256 10/29/2017 1951 Full Code [id number]  Etta Quill, DO ED   02/24/2017 1951 02/25/2017 1725 DNR [id number]  Karmen Bongo, MD Inpatient   06/12/2016 2021 06/14/2016 2149 Full Code [id number]  Vianne Bulls, MD ED   11/30/2012 2030 12/02/2012 2057 Full Code [id number]  Louellen Molder, MD Inpatient   08/15/2011 1827 08/19/2011 1821 Full Code [id number]  Kathalene Frames, MD ED   06/05/2011 1927 06/08/2011 1410 Full Code [id number]  Whitaker, Marilynn Rail, RN Inpatient    Advance Directive Documentation     Most Recent Value  Type of Advance Directive  Out of facility DNR (pink MOST or yellow form)  Pre-existing out of facility DNR order (yellow form or pink MOST form)  -- [Form not with patient today ]  "MOST" Form in Place?  -      Home/SNF/Other Home  Chief Complaint chemo pat, diff  with urination  Level of Care/Admitting Diagnosis ED Disposition    ED Disposition Condition [place]: [facility] [100102]  Level of Care: Med-Surg [16]  Diagnosis: Dehydration [276.51.ICD-9-CM]  Admitting Physician: Eugenie Filler Tok  Attending Physician: Eugenie Filler [[id number]]  Bed request comments: 6 [facility] if possible/Oncology floor  PT Class (Do Not Modify): Observation [104]  PT Acc Code (Do Not Modify): Observation [10022]       Medical History Past Medical History:  Diagnosis Date  . Abscess of anal and rectal regions    horse shoe abscess  . Alcoholic cirrhosis ([place])   . Arthritis    "everywhere; especially in my spine"  . Cirrhosis ([place])    STAGE 1 CIRRHOSIS PATIENT SEES DR NAMDIGAM FOR  . Colon cancer ([place]) 05/2005   TX SURGERY WITH LYMPH NODE REMOVAL  . Cough    LAST FEW WEEKS SAW DR RAMOS LUNGS MILKY SPUTUM OCC  . DM type 2 with diabetic peripheral neuropathy ([facility])    left foot  . Fatigue   . Fistula, anal    multiple  . History of substance abuse ([place])    last alcohol was 08/2011; last marijuania was 08/2011  . Hypertension   . PTSD (post-traumatic stress disorder)    "severe"  HX OF  . Sleep deprivation     Allergies Allergies  Allergen Reactions  . Morphine And Related Other (See Comments)    Patient is recovering from drug addiction and wants to avoid any narcotics    IV Location/Drains/Wounds Patient Lines/Drains/Airways Status   Active Line/Drains/Airways    Name:   Placement date:   Placement time:   Site:   Days:   Implanted Port 12/09/17 Right Chest   12/09/17    1057    Chest   13   Biliary Tube Cook slip-coat 10.2 Fr. RUQ   12/09/17    1006    RUQ   13   Urethral Catheter Juventino Slovak, RN Straight-tip 16 Fr.   12/22/17    1706    Straight-tip   less than 1   Incision (Closed) 02/24/17 Rectum   02/24/17    1812     301          Labs/Imaging Results for orders placed or performed  during the hospital encounter of 12/22/17 (from the past 48 hour(s))  CBC with Differential/Platelet     Status: Abnormal   Collection Time: 12/22/17 10:04 AM  Result Value Ref Range   WBC 6.5 4.0 - 10.5 K/uL   RBC 3.20 (L) 4.22 - 5.81 MIL/uL   Hemoglobin 9.4 (L) 13.0 - 17.0 g/dL   HCT 30.1 (L) 39.0 - 52.0 %   MCV 94.1 80.0 - 100.0 fL   MCH 29.4 26.0 - 34.0 pg   MCHC 31.2 30.0 - 36.0 g/dL   RDW 18.6 (H) 11.5 - 15.5 %   Platelets 300 150 - 400 K/uL   nRBC 0.0 0.0 - 0.2 %   Neutrophils Relative % 77 %   Neutro Abs 5.0 1.7 - 7.7 K/uL   Lymphocytes Relative 12 %   Lymphs Abs 0.8 0.7 - 4.0 K/uL   Monocytes Relative 2 %   Monocytes Absolute 0.1 0.1 - 1.0 K/uL   Eosinophils Relative 7 %   Eosinophils Absolute 0.5 0.0 - 0.5 K/uL   Basophils Relative 1 %   Basophils Absolute 0.0 0.0 - 0.1 K/uL   Immature Granulocytes 1 %   Abs Immature Granulocytes 0.03 0.00 - 0.07 K/uL    Comment: Performed at [facility], [place] [address] [place], [place] [address]  Comprehensive metabolic panel     Status: Abnormal   Collection Time: 12/22/17 10:04 AM  Result Value Ref Range   Sodium 132 (L) 135 - 145 mmol/L   Potassium 4.0 3.5 - 5.1 mmol/L   Chloride 98 98 - 111 mmol/L   CO2 26 22 - 32 mmol/L   Glucose, Bld 163 (H) 70 - 99 mg/dL   BUN 19 8 - 23 mg/dL   Creatinine, Ser 0.54 (L) 0.61 - 1.24 mg/dL   Calcium 9.0 8.9 - 10.3 mg/dL   Total Protein 8.0 6.5 - 8.1 g/dL   Albumin 2.9 (L) 3.5 - 5.0 g/dL   AST 120 (H) 15 - 41 U/L   ALT 143 (H) 0 - 44 U/L   Alkaline Phosphatase 525 (H) 38 - 126 U/L   Total Bilirubin 4.8 (H) 0.3 - 1.2 mg/dL   GFR calc non Af Amer >60 >60 mL/min   GFR calc Af Amer >60 >60 mL/min   Anion gap 8 5 - 15    Comment: Performed at [facility], [place] [address] [place], [place] [address]  Lipase, blood     Status: None  Collection Time: 12/22/17 10:04 AM  Result Value Ref Range   Lipase 26 11 - 51 U/L    Comment: Performed at [facility], [place] [address], [place], [place] [address]  Protime-INR     Status: None   Collection Time: 12/22/17 10:04 AM  Result Value Ref Range   Prothrombin Time 14.6 11.4 - 15.2 seconds   INR 1.15     Comment: Performed at [facility], [place] [address], [place], [place] [address]  Magnesium     Status: None   Collection Time: 12/22/17 10:04 AM  Result Value Ref Range   Magnesium 1.9 1.7 - 2.4 mg/dL    Comment: Performed at [facility], [place] [address], [place], [place] [address]  I-stat Chem 8, ED     Status: Abnormal   Collection Time: 12/22/17 11:46 AM  Result Value Ref Range   Sodium 135 135 - 145 mmol/L   Potassium 4.2 3.5 - 5.1 mmol/L   Chloride 102 98 - 111 mmol/L   BUN 16 8 - 23 mg/dL   Creatinine, Ser 0.40 (L) 0.61 - 1.24 mg/dL   Glucose, Bld 144 (H) 70 - 99 mg/dL   Calcium, Ion 1.18 1.15 - 1.40 mmol/L   TCO2 27 22 - 32 mmol/L   Hemoglobin 9.5 (L) 13.0 - 17.0 g/dL   HCT 28.0 (L) 39.0 - 52.0 %  Urinalysis, Routine w reflex microscopic     Status: Abnormal   Collection Time: 12/22/17  5:26 PM  Result Value Ref Range   Color, Urine YELLOW YELLOW   APPearance CLEAR CLEAR   Specific Gravity, Urine 1.010 1.005 - 1.030   pH 6.5 5.0 - 8.0   Glucose, UA NEGATIVE NEGATIVE mg/dL   Hgb urine dipstick NEGATIVE NEGATIVE   Bilirubin Urine SMALL (A) NEGATIVE   Ketones, ur NEGATIVE NEGATIVE mg/dL   Protein, ur NEGATIVE NEGATIVE mg/dL   Nitrite NEGATIVE NEGATIVE   Leukocytes, UA NEGATIVE NEGATIVE    Comment: Microscopic not done on urines with negative protein, blood, leukocytes, nitrite, or glucose < 500 mg/dL. Performed at [facility], [place] [address] [place], [place] [address]   Vitamin B12     Status: None   Collection Time: 12/22/17  5:33 PM  Result Value Ref Range   Vitamin B-12 624 180 - 914 pg/mL    Comment: (NOTE) This assay is not validated for testing neonatal or myeloproliferative syndrome  specimens for Vitamin B12 levels. Performed at [facility], [place] [address], [place], [place] [address]   Iron and TIBC     Status: None   Collection Time: 12/22/17  5:33 PM  Result Value Ref Range   Iron 91 45 - 182 ug/dL   TIBC 287 250 - 450 ug/dL   Saturation Ratios 32 17.9 - 39.5 %   UIBC 196 ug/dL    Comment: Performed at [facility], [place] [address] [place], [place] [address]  Ferritin     Status: None   Collection Time: 12/22/17  5:33 PM  Result Value Ref Range   Ferritin 332 24 - 336 ng/mL    Comment: Performed at [facility], [place] [address], [place], [place] [address]  Reticulocytes     Status: Abnormal   Collection Time: 12/22/17  5:33 PM  Result Value Ref Range   Retic Ct Pct 0.5 0.4 - 3.1 %   RBC. 3.13 (L) 4.22 - 5.81 MIL/uL   Retic  Count, Absolute 15.3 (L) 19.0 - 186.0 K/uL   Immature Retic Fract 3.3 2.3 - 15.9 %    Comment: Performed at [facility], [place] [address] [place], [place] [address]   Ct Abdomen Pelvis W Contrast  Result Date: 12/22/2017 CLINICAL DATA:  Intrahepatic cholangiocarcinoma, stage IV, with adrenal and retroperitoneal nodal metastases, diagnosed October 2019. Status post internal external percutaneous biliary drainage. Abdominal pain. Unable to micturate. Constipation. EXAM: CT ABDOMEN AND PELVIS WITH CONTRAST TECHNIQUE: Multidetector CT imaging of the abdomen and pelvis was performed using the standard protocol following bolus administration of intravenous contrast. CONTRAST:  154mL ISOVUE-300 IOPAMIDOL (ISOVUE-300) INJECTION 61% COMPARISON:  12/03/2017 CT abdomen/pelvis. FINDINGS: Lower chest: Solid irregular 3.0 cm left lower lobe lung mass (series 6/image 38), stable. Bandlike consolidation in the basilar right lower lobe base slightly decreased, favoring evolving postinfectious/postinflammatory scarring. Coronary atherosclerosis. Tip of right sided Port-A-Cath is seen at  the cavoatrial junction. Enlarged 1.8 cm right infrahilar node is stable since 10/27/2017 chest CT. Hepatobiliary: Liver surface is diffusely irregular, compatible with hepatic cirrhosis. Ill-defined infiltrative 5.8 x 4.8 cm hypoenhancing central left liver lobe mass (series 2/image 19), previously 5.5 x 5.0 cm on 12/03/2017 CT using similar measurement technique, not appreciably changed. Stable moderate intrahepatic biliary ductal dilatation throughout the left lower lobe peripheral to this mass. Multiple (at least 5) liver masses scattered throughout the periphery of the liver have not appreciably changed, largest 3.6 cm in the anterior superior liver (series 2/image 13) and 3.5 cm in the far inferior right liver lobe (series 2/image 32). Stable position of percutaneous internal external biliary drain traversing the right liver lobe and terminating within the descending duodenal lumen, with no significant intrahepatic biliary ductal dilatation in the right liver lobe. Expected pneumobilia within the nondependent gallbladder, which otherwise appears normal with no radiopaque cholelithiasis. Normal caliber common bile duct. Pancreas: Normal, with no mass or duct dilation. Spleen: Stable moderate splenomegaly with craniocaudal splenic length 17.0 cm. No splenic mass. Adrenals/Urinary Tract: Stable bilateral adrenal masses measuring 5.1 cm on the right and 2.5 cm on the left. No hydronephrosis. Nonobstructing 2 mm lower left renal stone. No renal masses. Prominently distended and otherwise normal urinary bladder. Stomach/Bowel: Normal non-distended stomach. Normal caliber small bowel with no small bowel wall thickening. Normal appendix. Stable postsurgical changes from partial left colectomy. Large amount of stool throughout remnant colon and rectum. New mild circumferential rectal wall thickening with slight perirectal fat haziness. Rectal diameter 7.7 cm. No definite rectal pneumatosis or perirectal free air. No  additional sites of large bowel wall thickening. Vascular/Lymphatic: Atherosclerotic nonaneurysmal abdominal aorta. Patent main and right portal, splenic, hepatic and renal veins. 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 Stable aortocaval adenopathy up to 1.8 cm (series 2/image 42). Stable enlarged 1.4 cm left para-aortic node (series 2/image 42). Stable enlarged 1.9 cm left mesenteric node (series 2/image 61). No new pathologically enlarged lymph nodes in the abdomen or pelvis. Reproductive: Mildly enlarged prostate. Other: No pneumoperitoneum. No focal fluid collection. Trace perihepatic ascites, decreased. Musculoskeletal: No aggressive appearing focal osseous lesions. Marked thoracolumbar spondylosis with stable bilateral L5 pars defects. IMPRESSION: 1. Prominently distended and otherwise normal urinary bladder with mild prostatomegaly. Findings suggest acute bladder outlet obstruction. No hydronephrosis. 2. Large colorectal stool volume compatible with constipation. New mild circumferential rectal wall thickening and perirectal fat haziness suggesting stercoral colitis. No free air. No abscess. 3. Stable infiltrative cholangiocarcinoma in the central left liver lobe with associated peripheral intrahepatic biliary ductal dilatation throughout  the left liver lobe. 4. Stable well-positioned internal external drain traversing the right liver lobe and decompressing the right liver biliary system. 5. Stable liver, bilateral adrenal, retroperitoneal, left mesenteric and right infrahilar nodal and left lung base metastases. No progressive metastatic disease since 12/03/2017 CT. 6. Cirrhosis. Trace perihepatic ascites, decreased. Stable moderate splenomegaly. 7.  Aortic Atherosclerosis (ICD10-I70.0). Electronically Signed   By: Ilona Sorrel M.D.   On: 12/22/2017 15:01   Dg Abd Acute W/chest  Result Date: 12/22/2017 CLINICAL DATA:  Metastatic cancer with constipation EXAM: DG ABDOMEN ACUTE W/ 1V CHEST COMPARISON:   12/03/2017 FINDINGS: Formed stool distends the colon diffusely. No small bowel dilatation or evident pneumoperitoneum. Percutaneous biliary catheter in unremarkable position. Porta catheter with tip at the upper cavoatrial junction. There is no edema, consolidation, effusion, or pneumothorax. Normal heart size IMPRESSION: Generalized stool retention consistent with history of constipation. Electronically Signed   By: Monte Fantasia M.D.   On: 12/22/2017 10:00   None  Pending Labs Unresulted Labs (From admission, onward)    Start     Ordered   12/23/17 0500  Comprehensive metabolic panel  Tomorrow morning,   R     12/22/17 1644   12/23/17 0500  CBC  Tomorrow morning,   R     12/22/17 1644   12/23/17 0500  Protime-INR  Tomorrow morning,   R     12/22/17 1644   12/22/17 1733  Folate  (Anemia Panel (PNL))  Once,   R     12/22/17 1732   12/22/17 1613  Culture, Urine  Once,   R     12/22/17 1612          Vitals/Pain Today's Vitals   12/22/17 1700 12/22/17 1830 12/22/17 1930 12/22/17 2000  BP: 100/66 115/78 117/71 113/69  Pulse: 92 (!) 101 93 97  Resp: 12 18 18 15   Temp:      TempSrc:      SpO2: 97% 97% 98% 100%  Weight:      Height:      PainSc:        Isolation Precautions No active isolations  Medications Medications  0.9 %  sodium chloride infusion ( Intravenous Stopped 12/22/17 1337)  aspirin EC tablet 81 mg (81 mg Oral Given 12/22/17 2016)  celecoxib (CELEBREX) capsule 200 mg (has no administration in time range)  feeding supplement (ENSURE ENLIVE) (ENSURE ENLIVE) liquid 237 mL (has no administration in time range)  multivitamin with minerals tablet 1 tablet (1 tablet Oral Given 12/22/17 2016)  loratadine (CLARITIN) tablet 10 mg (10 mg Oral Given 12/22/17 2016)  enoxaparin (LOVENOX) injection 40 mg (has no administration in time range)  sodium chloride flush (NS) 0.9 % injection 3 mL (has no administration in time range)  traMADol (ULTRAM) tablet 100 mg (has no  administration in time range)  polyethylene glycol (MIRALAX / GLYCOLAX) packet 17 g (has no administration in time range)  sorbitol 70 % solution 30 mL (has no administration in time range)  ondansetron (ZOFRAN) tablet 4 mg (has no administration in time range)    Or  ondansetron (ZOFRAN) injection 4 mg (has no administration in time range)  albuterol (PROVENTIL) (2.5 MG/3ML) 0.083% nebulizer solution 2.5 mg (has no administration in time range)  ipratropium (ATROVENT) nebulizer solution 0.5 mg (has no administration in time range)  senna-docusate (Senokot-S) tablet 1 tablet (has no administration in time range)  oxyCODONE (Oxy IR/ROXICODONE) immediate release tablet 5 mg (5 mg Oral Given 12/22/17 2015)  gabapentin (  NEURONTIN) capsule 400 mg (has no administration in time range)    And  gabapentin (NEURONTIN) capsule 800 mg (has no administration in time range)  sodium chloride 0.9 % bolus 1,000 mL (0 mLs Intravenous Stopped 12/22/17 1337)  sodium chloride (PF) 0.9 % injection ( Intravenous Given 12/22/17 2003)  iopamidol (ISOVUE-300) 61 % injection 100 mL (100 mLs Intravenous Contrast Given 12/22/17 1413)  sodium chloride 0.9 % bolus 1,000 mL (0 mLs Intravenous Stopped 12/22/17 1926)    Mobility walks

## 2017-12-22 NOTE — ED Provider Notes (Addendum)
[facility] DEPT Provider Note   CSN: [id number] Arrival date & time: 12/22/17  0805     History   Chief Complaint Chief Complaint  Patient presents with  . Dysuria  . Cancer    HPI DECKLYN HYDER is a 62 y.o. male brought in by his wife from home.  2 concerns not eating or drinking very well.  Also difficulty urinating.  Also some complaint of constipation but has had that before states that he is not able to urinate.  Patient has stage IV liver bile duct and metastatic lung cancer.  Patient has a biliary drain on his right side of his liver.  Which is functioning well.  No problem there.  Patient's oncologist is [name].     Past Medical History:  Diagnosis Date  . Abscess of anal and rectal regions    horse shoe abscess  . Alcoholic cirrhosis ([place])   . Arthritis    "everywhere; especially in my spine"  . Cirrhosis ([place])    STAGE 1 CIRRHOSIS PATIENT SEES DR NAMDIGAM FOR  . Colon cancer ([place]) 05/2005   TX SURGERY WITH LYMPH NODE REMOVAL  . Cough    LAST FEW WEEKS SAW DR RAMOS LUNGS MILKY SPUTUM OCC  . DM type 2 with diabetic peripheral neuropathy (HCC)    left foot  . Fatigue   . Fistula, anal    multiple  . History of substance abuse ([place])    last alcohol was 08/2011; last marijuania was 08/2011  . Hypertension   . PTSD (post-traumatic stress disorder)    "severe" HX OF  . Sleep deprivation     Patient Active Problem List   Diagnosis Date Noted  . Dehydration 12/22/2017  . Bladder outlet obstruction 12/22/2017  . Constipation 12/22/2017  . Sepsis due to gram-negative bacteria ([place])   . Severe sepsis with acute organ dysfunction due to Gram negative bacteria ([place]) 12/03/2017  . Septic shock ([place])   . Cholangiocarcinoma ([place])   . Hypotension 11/20/2017  . Acute renal failure (ARF) ([place]) 11/20/2017  . Palliative care by specialist   . Segmental colitis ([place]) 11/09/2017  . Elevated lipase 11/09/2017  . Intrahepatic  cholangiocarcinoma ([place]) 11/06/2017  . Goals of care, counseling/discussion 11/06/2017  . Cirrhosis ([place])   . Malignant neoplasm metastatic to adrenal gland ([place])   . Biliary obstruction due to malignant neoplasm ([place]) 10/23/2017  . Transaminitis 10/23/2017  . Liver lesion 10/23/2017  . History of colon cancer 10/23/2017  . Perirectal abscess 02/24/2017  . Moderate protein-calorie malnutrition ([place]) 02/24/2017  . Sepsis ([place]) 02/24/2017  . Pulmonary nodule 02/24/2017  . Alcoholic cirrhosis of liver without ascites ([place]) 02/24/2017  . Duodenitis 06/13/2016  . Hyponatremia 06/12/2016  . Nausea & vomiting 06/12/2016  . Thrombocytopenia ([place]) 06/12/2016  . Acute gastroenteritis 06/12/2016  . PTSD (post-traumatic stress disorder) 12/02/2012  . Hx of substance abuse- clean several years now 12/02/2012  . Chest pain with moderate risk of acute coronary syndrome 11/30/2012  . Obesity 11/30/2012  . Hypertension 11/30/2012  . Hyperlipemia 11/30/2012  . Diabetes mellitus ([place]) 11/30/2012  . CAD (coronary artery disease)- PCI 2001- no evaluation since 11/30/2012    Past Surgical History:  Procedure Laterality Date  . ABSCESS DRAINAGE     "probably 15-20 so far at least; rectal"  . ANAL FISTULECTOMY  12/13/1998  . ANTERIOR FUSION CERVICAL SPINE  2010   "triple"; "did 2 surgeries on the same day in 3762 due to complications"  .  CORONARY ANGIOPLASTY  2001  . detached muscle  2010   right chest; "after cervical fusion complications"  . ELBOW SURGERY  2007   "cut out part of a muscle"; left  . EXAMINATION UNDER ANESTHESIA  03/22/2005   fistula  . EXAMINATION UNDER ANESTHESIA  06/06/2011   Procedure: EXAM UNDER ANESTHESIA;  Surgeon: Stark Klein, MD;  Location: [facility];  Service: General;  Laterality: N/A;  . HEMICOLECTOMY  06/06/2005   left  . INCISION AND DRAINAGE PERIRECTAL ABSCESS  03/30/2005  . INCISION AND DRAINAGE PERIRECTAL ABSCESS  12/13/2004  . INCISION AND DRAINAGE PERIRECTAL  ABSCESS  07/14/2001  . INCISION AND DRAINAGE PERIRECTAL ABSCESS  08/12/2010   horseshoe abscess; Dr Redmond Pulling  . INCISION AND DRAINAGE PERIRECTAL ABSCESS  06/06/2011   Procedure: IRRIGATION AND DEBRIDEMENT PERIRECTAL ABSCESS;  Surgeon: Stark Klein, MD;  Location: [facility];  Service: General;  Laterality: N/A;  . INCISION AND DRAINAGE PERIRECTAL ABSCESS N/A 02/24/2017   Procedure: IRRIGATION AND DEBRIDEMENT PERIRECTAL ABSCESS;  Surgeon: Ralene Ok, MD;  Location: [facility];  Service: General;  Laterality: N/A;  . IR EXCHANGE BILIARY DRAIN  11/07/2017  . IR EXCHANGE BILIARY DRAIN  12/09/2017  . IR IMAGING GUIDED PORT INSERTION  12/09/2017  . IR INT EXT BILIARY DRAIN WITH CHOLANGIOGRAM  10/26/2017  . IR PATIENT EVAL TECH 0-60 MINS  12/02/2017  . IR RADIOLOGIST EVAL & MGMT  11/06/2017  . [place]  . PERCUTANEOUS PINNING PHALANX FRACTURE OF HAND  ~ 2008   "plates in 2 places"; right  . PLACEMENT OF SETON N/A 09/05/2017   Procedure: PLACEMENT OF SETON;  Surgeon: Leighton Ruff, MD;  Location: [facility];  Service: General;  Laterality: N/A;  . RADIOLOGY WITH ANESTHESIA N/A 10/25/2017   Procedure: MRI WITH ANESTHESIA;  Surgeon: Radiologist, Medication, MD;  Location: [facility];  Service: Radiology;  Laterality: N/A;  . THORACIC DISCECTOMY  1990's        Home Medications    Prior to Admission medications   Medication Sig Start Date End Date Taking? Authorizing Provider  aspirin EC 81 MG tablet Take 81 mg by mouth daily.   Yes [provider]  calcium carbonate (TUMS - DOSED IN MG ELEMENTAL CALCIUM) 500 MG chewable tablet Chew 1 tablet by mouth 2 (two) times daily as needed for indigestion or heartburn.   Yes [provider]  celecoxib (CELEBREX) 200 MG capsule Take 200 mg by mouth at bedtime.  10/10/17  Yes [provider]  fexofenadine (ALLEGRA) 60 MG tablet Take 60 mg by mouth 2 (two) times daily.   Yes [provider]  gabapentin  (NEURONTIN) 400 MG capsule Take 400-800 mg by mouth See admin instructions. Take 400 mg by mouth in the morning and 800 mg in the evening   Yes [provider]  lactose free nutrition (BOOST PLUS) LIQD Take 237 mLs by mouth 3 (three) times daily with meals. 12/11/17  Yes Regalado, Belkys A, MD  Multiple Vitamin (MULTIVITAMIN) tablet Take 1 tablet by mouth daily.   Yes [provider]  ondansetron (ZOFRAN) 8 MG tablet Take 1 tablet (8 mg total) by mouth 2 (two) times daily as needed. Start on the third day after chemotherapy. 11/06/17  Yes [name], MD  prochlorperazine (COMPAZINE) 10 MG tablet Take 1 tablet (10 mg total) by mouth every 6 (six) hours as needed (Nausea or vomiting). Patient not taking: Reported on 12/22/2017 11/06/17   [name], MD    Family  History Family History  Problem Relation Age of Onset  . Heart disease Father   . Heart disease Mother   . Cancer Mother   . Cancer Sister   . Heart disease Brother     Social History Social History   Tobacco Use  . Smoking status: Former Smoker    Packs/day: 1.00    Years: 10.00    Pack years: 10.00    Types: Cigarettes    Last attempt to quit: 01/08/1991    Years since quitting: 26.9  . Smokeless tobacco: Never Used  Substance Use Topics  . Alcohol use: Yes    Comment: 06/05/11 "drank enough when I was younger to last me my whole life; don't remember when I had my last drink, maybe 2012"  . Drug use: Not Currently    Types: Oxycodone, Marijuana    Comment: 06/05/11 "pot head in my younger days; 2009 dr had me on oxycodone and valium for almost 48yr     Allergies   Morphine and related   Review of Systems Review of Systems  Constitutional: Positive for appetite change. Negative for fever.  HENT: Negative for congestion.   Eyes: Negative for visual disturbance.  Respiratory: Negative for shortness of breath.   Cardiovascular: Negative for chest pain.  Gastrointestinal: Positive for abdominal  distention and abdominal pain. Negative for nausea.  Genitourinary: Positive for difficulty urinating.  Musculoskeletal: Negative for back pain.  Skin: Negative for rash.  Neurological: Negative for headaches.  Hematological: Does not bruise/bleed easily.  Psychiatric/Behavioral: Negative for confusion.     Physical Exam Updated Vital Signs BP (!) 100/54   Pulse 89   Temp 97.6 F (36.4 C) (Oral)   Resp 17   Ht 1.803 m (5' 11" )   Wt 95.3 kg   SpO2 98%   BMI 29.29 kg/m   Physical Exam Vitals signs and nursing note reviewed.  Constitutional:      Appearance: Normal appearance.  HENT:     Head: Normocephalic and atraumatic.     Mouth/Throat:     Mouth: Mucous membranes are dry.  Eyes:     General: Scleral icterus present.  Cardiovascular:     Rate and Rhythm: Normal rate and regular rhythm.  Pulmonary:     Effort: Pulmonary effort is normal. No respiratory distress.     Breath sounds: Normal breath sounds.  Abdominal:     General: There is distension.  Musculoskeletal:        General: Swelling present.  Skin:    General: Skin is warm.     Coloration: Skin is jaundiced.  Neurological:     Mental Status: He is alert.     Cranial Nerves: No cranial nerve deficit.     Sensory: No sensory deficit.     Motor: No weakness.      ED Treatments / Results  Labs (all labs ordered are listed, but only abnormal results are displayed) Labs Reviewed  CBC WITH DIFFERENTIAL/PLATELET - Abnormal; Notable for the following components:      Result Value   RBC 3.20 (*)    Hemoglobin 9.4 (*)    HCT 30.1 (*)    RDW 18.6 (*)    All other components within normal limits  COMPREHENSIVE METABOLIC PANEL - Abnormal; Notable for the following components:   Sodium 132 (*)    Glucose, Bld 163 (*)    Creatinine, Ser 0.54 (*)    Albumin 2.9 (*)    AST 120 (*)    ALT  143 (*)    Alkaline Phosphatase 525 (*)    Total Bilirubin 4.8 (*)    All other components within normal limits    I-STAT CHEM 8, ED - Abnormal; Notable for the following components:   Creatinine, Ser 0.40 (*)    Glucose, Bld 144 (*)    Hemoglobin 9.5 (*)    HCT 28.0 (*)    All other components within normal limits  URINE CULTURE  LIPASE, BLOOD  PROTIME-INR  URINALYSIS, ROUTINE W REFLEX MICROSCOPIC  MAGNESIUM    EKG None  Radiology Ct Abdomen Pelvis W Contrast  Result Date: 12/22/2017 CLINICAL DATA:  Intrahepatic cholangiocarcinoma, stage IV, with adrenal and retroperitoneal nodal metastases, diagnosed October 2019. Status post internal external percutaneous biliary drainage. Abdominal pain. Unable to micturate. Constipation. EXAM: CT ABDOMEN AND PELVIS WITH CONTRAST TECHNIQUE: Multidetector CT imaging of the abdomen and pelvis was performed using the standard protocol following bolus administration of intravenous contrast. CONTRAST:  164m ISOVUE-300 IOPAMIDOL (ISOVUE-300) INJECTION 61% COMPARISON:  12/03/2017 CT abdomen/pelvis. FINDINGS: Lower chest: Solid irregular 3.0 cm left lower lobe lung mass (series 6/image 38), stable. Bandlike consolidation in the basilar right lower lobe base slightly decreased, favoring evolving postinfectious/postinflammatory scarring. Coronary atherosclerosis. Tip of right sided Port-A-Cath is seen at the cavoatrial junction. Enlarged 1.8 cm right infrahilar node is stable since 10/27/2017 chest CT. Hepatobiliary: Liver surface is diffusely irregular, compatible with hepatic cirrhosis. Ill-defined infiltrative 5.8 x 4.8 cm hypoenhancing central left liver lobe mass (series 2/image 19), previously 5.5 x 5.0 cm on 12/03/2017 CT using similar measurement technique, not appreciably changed. Stable moderate intrahepatic biliary ductal dilatation throughout the left lower lobe peripheral to this mass. Multiple (at least 5) liver masses scattered throughout the periphery of the liver have not appreciably changed, largest 3.6 cm in the anterior superior liver (series 2/image 13) and  3.5 cm in the far inferior right liver lobe (series 2/image 32). Stable position of percutaneous internal external biliary drain traversing the right liver lobe and terminating within the descending duodenal lumen, with no significant intrahepatic biliary ductal dilatation in the right liver lobe. Expected pneumobilia within the nondependent gallbladder, which otherwise appears normal with no radiopaque cholelithiasis. Normal caliber common bile duct. Pancreas: Normal, with no mass or duct dilation. Spleen: Stable moderate splenomegaly with craniocaudal splenic length 17.0 cm. No splenic mass. Adrenals/Urinary Tract: Stable bilateral adrenal masses measuring 5.1 cm on the right and 2.5 cm on the left. No hydronephrosis. Nonobstructing 2 mm lower left renal stone. No renal masses. Prominently distended and otherwise normal urinary bladder. Stomach/Bowel: Normal non-distended stomach. Normal caliber small bowel with no small bowel wall thickening. Normal appendix. Stable postsurgical changes from partial left colectomy. Large amount of stool throughout remnant colon and rectum. New mild circumferential rectal wall thickening with slight perirectal fat haziness. Rectal diameter 7.7 cm. No definite rectal pneumatosis or perirectal free air. No additional sites of large bowel wall thickening. Vascular/Lymphatic: Atherosclerotic nonaneurysmal abdominal aorta. Patent main and right portal, splenic, hepatic and renal veins. 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 Stable aortocaval adenopathy up to 1.8 cm (series 2/image 42). Stable enlarged 1.4 cm left para-aortic node (series 2/image 42). Stable enlarged 1.9 cm left mesenteric node (series 2/image 61). No new pathologically enlarged lymph nodes in the abdomen or pelvis. Reproductive: Mildly enlarged prostate. Other: No pneumoperitoneum. No focal fluid collection. Trace perihepatic ascites, decreased. Musculoskeletal: No aggressive appearing focal osseous lesions. Marked  thoracolumbar spondylosis with stable bilateral L5 pars defects. IMPRESSION: 1. Prominently distended and  otherwise normal urinary bladder with mild prostatomegaly. Findings suggest acute bladder outlet obstruction. No hydronephrosis. 2. Large colorectal stool volume compatible with constipation. New mild circumferential rectal wall thickening and perirectal fat haziness suggesting stercoral colitis. No free air. No abscess. 3. Stable infiltrative cholangiocarcinoma in the central left liver lobe with associated peripheral intrahepatic biliary ductal dilatation throughout the left liver lobe. 4. Stable well-positioned internal external drain traversing the right liver lobe and decompressing the right liver biliary system. 5. Stable liver, bilateral adrenal, retroperitoneal, left mesenteric and right infrahilar nodal and left lung base metastases. No progressive metastatic disease since 12/03/2017 CT. 6. Cirrhosis. Trace perihepatic ascites, decreased. Stable moderate splenomegaly. 7.  Aortic Atherosclerosis (ICD10-I70.0). Electronically Signed   By: Ilona Sorrel M.D.   On: 12/22/2017 15:01   Dg Abd Acute W/chest  Result Date: 12/22/2017 CLINICAL DATA:  Metastatic cancer with constipation EXAM: DG ABDOMEN ACUTE W/ 1V CHEST COMPARISON:  12/03/2017 FINDINGS: Formed stool distends the colon diffusely. No small bowel dilatation or evident pneumoperitoneum. Percutaneous biliary catheter in unremarkable position. Porta catheter with tip at the upper cavoatrial junction. There is no edema, consolidation, effusion, or pneumothorax. Normal heart size IMPRESSION: Generalized stool retention consistent with history of constipation. Electronically Signed   By: Monte Fantasia M.D.   On: 12/22/2017 10:00    Procedures Procedures (including critical care time)  Medications Ordered in ED Medications  0.9 %  sodium chloride infusion ( Intravenous Stopped 12/22/17 1337)  iopamidol (ISOVUE-300) 61 % injection (has no  administration in time range)  sodium chloride (PF) 0.9 % injection (has no administration in time range)  sodium chloride 0.9 % bolus 1,000 mL (has no administration in time range)  aspirin EC tablet 81 mg (has no administration in time range)  celecoxib (CELEBREX) capsule 200 mg (has no administration in time range)  gabapentin (NEURONTIN) capsule 400-800 mg (has no administration in time range)  lactose free nutrition (BOOST PLUS) liquid 237 mL (has no administration in time range)  multivitamin tablet 1 tablet (has no administration in time range)  loratadine (CLARITIN) tablet 10 mg (has no administration in time range)  enoxaparin (LOVENOX) injection 40 mg (has no administration in time range)  sodium chloride flush (NS) 0.9 % injection 3 mL (has no administration in time range)  traMADol (ULTRAM) tablet 100 mg (has no administration in time range)  polyethylene glycol (MIRALAX / GLYCOLAX) packet 17 g (has no administration in time range)  sorbitol 70 % solution 30 mL (has no administration in time range)  ondansetron (ZOFRAN) tablet 4 mg (has no administration in time range)    Or  ondansetron (ZOFRAN) injection 4 mg (has no administration in time range)  albuterol (PROVENTIL) (2.5 MG/3ML) 0.083% nebulizer solution 2.5 mg (has no administration in time range)  ipratropium (ATROVENT) nebulizer solution 0.5 mg (has no administration in time range)  senna-docusate (Senokot-S) tablet 1 tablet (has no administration in time range)  oxyCODONE (Oxy IR/ROXICODONE) immediate release tablet 5 mg (has no administration in time range)  sodium chloride 0.9 % bolus 1,000 mL (0 mLs Intravenous Stopped 12/22/17 1337)  iopamidol (ISOVUE-300) 61 % injection 100 mL (100 mLs Intravenous Contrast Given 12/22/17 1413)     Initial Impression / Assessment and Plan / ED Course  I have reviewed the triage vital signs and the nursing notes.  Pertinent labs & imaging results that were available during my care  of the patient were reviewed by me and considered in my medical decision making (see chart for  details).     Patient with known metastatic hepatobiliary cancer.  Has a stent in the right side of his liver.  CT scan here today showed evidence of constipation but no significant bowel obstruction.  Show some enlargement of the left side of the liver.  However patient's bilirubin is actually improved here today.  Patient was very dehydrated.  CT scan showed evidence of bladder obstruction.  May be secondary to prostate.  Initially patient had bladder scan when he got here only had about 200 cc of urine in place and he clinically looked dry.  He also is not eating or drinking well.  So he received fluids CT scan was done later which showed this does not show any blockage of the ureters.  Discussed with his oncologist Dr. Annamaria Boots.  She wanted him admitted for hydration and also I think she was planning already to try to get the left side of his liver drained with tube placement.  So she wants him admitted.  Foley catheter will be placed.   Patient discussed with hospitalist who will admit.  Final Clinical Impressions(s) / ED Diagnoses   Final diagnoses:  Bladder outflow obstruction  Dehydration  Biliary obstruction  Malignant neoplasm of liver, unspecified liver malignancy type [facility])    ED Discharge Orders    None       Fredia Sorrow, MD 12/22/17 1653    Fredia Sorrow, MD 12/22/17 1654

## 2017-12-22 NOTE — ED Notes (Signed)
Patient made aware of urine sample needed. Pt unable to urinate at this time.

## 2017-12-22 NOTE — H&P (Signed)
History and Physical    DAVYD PODGORSKI JZP:[id number] DOB: Dec 30, 1955 DOA: 12/22/2017  PCP: [name], MD  Patient coming from: Home  I have personally briefly reviewed patient's old medical records in [place]  Chief Complaint: I cannot pee.  I am constipated.  HPI: Matthew Lewis is a 62 y.o. male with medical history significant of intrahepatic cholangiocarcinoma with mets to the liver and lung receiving chemotherapy per patient last chemotherapy 4 days prior to admission, history of prior alcohol abuse with cirrhosis, status post percutaneous biliary drainage tube for biliary obstruction, recent hospitalization December 03, 2017 to December 11, 2017 for septic shock secondary to ESBL, E. coli bacteremia status post 2-week course of IV antibiotics presented to the ED with complaints of inability to urinate and constipated.  Patient states last time he urinated was the morning of the day prior to admission with a weak stream.  Patient states his last bowel movement was 2 days prior to admission.  Patient denies any fevers, no chills, no nausea, no vomiting, no chest pain, no shortness of breath, no abdominal pain.  Patient does endorse abdominal bloating.  Denies any diarrhea.  Denies any dysuria, no melena, hematemesis, no hematochezia, no dizziness, no lightheadedness, no syncopal episodes, no visual changes.  Patient does endorse constipation.  Patient denies any asymmetric weakness or numbness.  ED Course: Patient seen in the ED, comprehensive metabolic profile obtained had a sodium of 132, glucose of 163, alk phosphatase of 525, AST of 120, ALT of 143, total bilirubin of 4.8.  CBC done had a hemoglobin of 9.4 otherwise was within normal limits.  Acute abdominal series showed generalized stool retention consistent with history of constipation.  CT abdomen and pelvis done showed prominently distended and otherwise normal urinary bladder with mild prostatomegaly.  Findings  suggest acute bladder outlet obstruction.  No hydronephrosis.  Large colorectal stool volume compatible with constipation.  New mild circumferential rectal wall thickening and perirectal fat haziness suggesting steroid coral colitis.  No free air.  No abscess.  Stable infiltrative cholangiocarcinoma in the central left liver lobe with associated peripheral intrahepatic biliary ductal dilatation throughout the left liver lobe.  Stable well-positioned internal/external drain traversing the right liver lobe and decompressing the right liver biliary system.  Stable liver, bilateral adrenal, retroperitoneal, left mesenteric and right infrahilar nodal and left lung base metastases.  No progressive metastatic disease since 12/03/2017 CT.  Cirrhosis.  Trace perihepatic ascites, decreased.  Stable moderate splenomegaly. Patient given some IV fluids.  Foley catheter ordered.  Hospitalist were called to admit the patient for further evaluation and management. ED physician states he spoke with patient's oncologist, [name] who followed the patient throughout the hospitalization and recommended patient be admitted for dehydration and probable biliary stent placement.  Review of Systems: As per HPI otherwise 10 point review of systems negative.   Past Medical History:  Diagnosis Date  . Abscess of anal and rectal regions    horse shoe abscess  . Alcoholic cirrhosis ([place])   . Arthritis    "everywhere; especially in my spine"  . Cirrhosis ([place])    STAGE 1 CIRRHOSIS PATIENT SEES DR NAMDIGAM FOR  . Colon cancer ([place]) 05/2005   TX SURGERY WITH LYMPH NODE REMOVAL  . Cough    LAST FEW WEEKS SAW DR RAMOS LUNGS MILKY SPUTUM OCC  . DM type 2 with diabetic peripheral neuropathy (H[facility])    left foot  . Fatigue   . Fistula, anal  multiple  . History of substance abuse ([place])    last alcohol was 08/2011; last marijuania was 08/2011  . Hypertension   . PTSD (post-traumatic stress disorder)    "severe" HX OF  . Sleep  deprivation     Past Surgical History:  Procedure Laterality Date  . ABSCESS DRAINAGE     "probably 15-20 so far at least; rectal"  . ANAL FISTULECTOMY  12/13/1998  . ANTERIOR FUSION CERVICAL SPINE  2010   "triple"; "did 2 surgeries on the same day in 0737 due to complications"  . CORONARY ANGIOPLASTY  2001  . detached muscle  2010   right chest; "after cervical fusion complications"  . ELBOW SURGERY  2007   "cut out part of a muscle"; left  . EXAMINATION UNDER ANESTHESIA  03/22/2005   fistula  . EXAMINATION UNDER ANESTHESIA  06/06/2011   Procedure: EXAM UNDER ANESTHESIA;  Surgeon: Stark Klein, MD;  Location: [facility];  Service: General;  Laterality: N/A;  . HEMICOLECTOMY  06/06/2005   left  . INCISION AND DRAINAGE PERIRECTAL ABSCESS  03/30/2005  . INCISION AND DRAINAGE PERIRECTAL ABSCESS  12/13/2004  . INCISION AND DRAINAGE PERIRECTAL ABSCESS  07/14/2001  . INCISION AND DRAINAGE PERIRECTAL ABSCESS  08/12/2010   horseshoe abscess; Dr Redmond Pulling  . INCISION AND DRAINAGE PERIRECTAL ABSCESS  06/06/2011   Procedure: IRRIGATION AND DEBRIDEMENT PERIRECTAL ABSCESS;  Surgeon: Stark Klein, MD;  Location: [facility];  Service: General;  Laterality: N/A;  . INCISION AND DRAINAGE PERIRECTAL ABSCESS N/A 02/24/2017   Procedure: IRRIGATION AND DEBRIDEMENT PERIRECTAL ABSCESS;  Surgeon: Ralene Ok, MD;  Location: [facility];  Service: General;  Laterality: N/A;  . IR EXCHANGE BILIARY DRAIN  11/07/2017  . IR EXCHANGE BILIARY DRAIN  12/09/2017  . IR IMAGING GUIDED PORT INSERTION  12/09/2017  . IR INT EXT BILIARY DRAIN WITH CHOLANGIOGRAM  10/26/2017  . IR PATIENT EVAL TECH 0-60 MINS  12/02/2017  . IR RADIOLOGIST EVAL & MGMT  11/06/2017  . [place]  . PERCUTANEOUS PINNING PHALANX FRACTURE OF HAND  ~ 2008   "plates in 2 places"; right  . PLACEMENT OF SETON N/A 09/05/2017   Procedure: PLACEMENT OF SETON;  Surgeon: Leighton Ruff, MD;  Location: [facility];  Service: General;  Laterality:  N/A;  . RADIOLOGY WITH ANESTHESIA N/A 10/25/2017   Procedure: MRI WITH ANESTHESIA;  Surgeon: Radiologist, Medication, MD;  Location: [facility];  Service: Radiology;  Laterality: N/A;  . THORACIC DISCECTOMY  1990's     reports that he quit smoking about 26 years ago. His smoking use included cigarettes. He has a 10.00 pack-year smoking history. He has never used smokeless tobacco. He reports current alcohol use. He reports previous drug use. Drugs: Oxycodone and Marijuana.  Allergies  Allergen Reactions  . Morphine And Related Other (See Comments)    Patient is recovering from drug addiction and wants to avoid any narcotics    Family History  Problem Relation Age of Onset  . Heart disease Father   . Heart disease Mother   . Cancer Mother   . Cancer Sister   . Heart disease Brother    Mother deceased age 87 from an acute MI per patient.  Father deceased in 60 from emphysema per patient.  Prior to Admission medications   Medication Sig Start Date End Date Taking? Authorizing Provider  aspirin EC 81 MG tablet Take 81 mg by mouth daily.   Yes [provider]  calcium carbonate (TUMS - DOSED  IN MG ELEMENTAL CALCIUM) 500 MG chewable tablet Chew 1 tablet by mouth 2 (two) times daily as needed for indigestion or heartburn.   Yes [provider]  celecoxib (CELEBREX) 200 MG capsule Take 200 mg by mouth at bedtime.  10/10/17  Yes [provider]  fexofenadine (ALLEGRA) 60 MG tablet Take 60 mg by mouth 2 (two) times daily.   Yes [provider]  gabapentin (NEURONTIN) 400 MG capsule Take 400-800 mg by mouth See admin instructions. Take 400 mg by mouth in the morning and 800 mg in the evening   Yes [provider]  lactose free nutrition (BOOST PLUS) LIQD Take 237 mLs by mouth 3 (three) times daily with meals. 12/11/17  Yes Regalado, Belkys A, MD  Multiple Vitamin (MULTIVITAMIN) tablet Take 1 tablet by mouth daily.   Yes [provider]    ondansetron (ZOFRAN) 8 MG tablet Take 1 tablet (8 mg total) by mouth 2 (two) times daily as needed. Start on the third day after chemotherapy. 11/06/17  Yes [name], MD  prochlorperazine (COMPAZINE) 10 MG tablet Take 1 tablet (10 mg total) by mouth every 6 (six) hours as needed (Nausea or vomiting). Patient not taking: Reported on 12/22/2017 11/06/17   [name], MD    Physical Exam: Vitals:   12/22/17 1400 12/22/17 1430 12/22/17 1500 12/22/17 1530  BP: 114/89 122/79 120/80 (!) 100/54  Pulse: 100 90 94 89  Resp: _0 Temp:      TempSrc:      SpO2: 97% 98% 99% 98%  Weight:      Height:        Constitutional: NAD, calm, comfortable.  Slightly jaundiced Vitals:   12/22/17 1400 12/22/17 1430 12/22/17 1500 12/22/17 1530  BP: 114/89 122/79 120/80 (!) 100/54  Pulse: 100 90 94 89  Resp: _1 Temp:      TempSrc:      SpO2: 97% 98% 99% 98%  Weight:      Height:       Eyes: PERRLA, lids and conjunctivae normal.  Scleral icterus. ENMT: Mucous membranes are dry. Posterior pharynx clear of any exudate or lesions.Normal dentition.  Neck: normal, supple, no masses, no thyromegaly Respiratory: clear to auscultation bilaterally, no wheezing, no crackles. Normal respiratory effort. No accessory muscle use.  Cardiovascular: Regular rate and rhythm, no murmurs / rubs / gallops. No extremity edema. 2+ pedal pulses. No carotid bruits.  Abdomen: Soft, mildly distended, positive bowel sounds, no rebound, no guarding, significant tenderness to palpation in the suprapubic region. Musculoskeletal: no clubbing / cyanosis. No joint deformity upper and lower extremities. Good ROM, no contractures. Normal muscle tone.  Skin: no rashes, lesions, ulcers. No induration Neurologic: CN 2-12 grossly intact. Sensation intact, DTR normal. Strength 5/5 in all 4.  Psychiatric: Normal judgment and insight. Alert and oriented x 3. Normal mood.   Labs on Admission: I have personally reviewed  following labs and imaging studies  CBC: Recent Labs  Lab 12/17/17 1145 12/22/17 1004 12/22/17 1146  WBC 8.2 6.5  --   NEUTROABS 5.8 5.0  --   HGB 10.0* 9.4* 9.5*  HCT 30.7* 30.1* 28.0*  MCV 89.2 94.1  --   PLT 374 300  --    Basic Metabolic Panel: Recent Labs  Lab 12/17/17 1145 12/22/17 1004 12/22/17 1146  NA 136 132* 135  K 4.3 4.0 4.2  CL 102 98 102  CO2 27 26  --   GLUCOSE 147*  163* 144*  BUN _0 CREATININE 0.72 0.54* 0.40*  CALCIUM 9.0 9.0  --   MG  --  1.9  --    GFR: Estimated Creatinine Clearance: 109.9 mL/min (A) (by C-G formula based on SCr of 0.4 mg/dL (L)). Liver Function Tests: Recent Labs  Lab 12/17/17 1145 12/22/17 1004  AST 155* 120*  ALT 168* 143*  ALKPHOS 1,076* 525*  BILITOT 4.5* 4.8*  PROT 7.8 8.0  ALBUMIN 2.5* 2.9*   Recent Labs  Lab 12/22/17 1004  LIPASE 26   No results for input(s): AMMONIA in the last 168 hours. Coagulation Profile: Recent Labs  Lab 12/22/17 1004  INR 1.15   Cardiac Enzymes: No results for input(s): CKTOTAL, CKMB, CKMBINDEX, TROPONINI in the last 168 hours. BNP (last 3 results) No results for input(s): PROBNP in the last 8760 hours. HbA1C: No results for input(s): HGBA1C in the last 72 hours. CBG: No results for input(s): GLUCAP in the last 168 hours. Lipid Profile: No results for input(s): CHOL, HDL, LDLCALC, TRIG, CHOLHDL, LDLDIRECT in the last 72 hours. Thyroid Function Tests: No results for input(s): TSH, T4TOTAL, FREET4, T3FREE, THYROIDAB in the last 72 hours. Anemia Panel: No results for input(s): VITAMINB12, FOLATE, FERRITIN, TIBC, IRON, RETICCTPCT in the last 72 hours. Urine analysis:    Component Value Date/Time   COLORURINE YELLOW 11/20/2017 1653   APPEARANCEUR HAZY (A) 11/20/2017 1653   LABSPEC 1.011 11/20/2017 1653   PHURINE 5.0 11/20/2017 1653   GLUCOSEU 150 (A) 11/20/2017 1653   HGBUR NEGATIVE 11/20/2017 1653   BILIRUBINUR NEGATIVE 11/20/2017 1653   KETONESUR NEGATIVE  11/20/2017 1653   PROTEINUR NEGATIVE 11/20/2017 1653   UROBILINOGEN 0.2 08/15/2011 1816   NITRITE NEGATIVE 11/20/2017 1653   LEUKOCYTESUR NEGATIVE 11/20/2017 1653    Radiological Exams on Admission: Ct Abdomen Pelvis W Contrast  Result Date: 12/22/2017 CLINICAL DATA:  Intrahepatic cholangiocarcinoma, stage IV, with adrenal and retroperitoneal nodal metastases, diagnosed October 2019. Status post internal external percutaneous biliary drainage. Abdominal pain. Unable to micturate. Constipation. EXAM: CT ABDOMEN AND PELVIS WITH CONTRAST TECHNIQUE: Multidetector CT imaging of the abdomen and pelvis was performed using the standard protocol following bolus administration of intravenous contrast. CONTRAST:  14m ISOVUE-300 IOPAMIDOL (ISOVUE-300) INJECTION 61% COMPARISON:  12/03/2017 CT abdomen/pelvis. FINDINGS: Lower chest: Solid irregular 3.0 cm left lower lobe lung mass (series 6/image 38), stable. Bandlike consolidation in the basilar right lower lobe base slightly decreased, favoring evolving postinfectious/postinflammatory scarring. Coronary atherosclerosis. Tip of right sided Port-A-Cath is seen at the cavoatrial junction. Enlarged 1.8 cm right infrahilar node is stable since 10/27/2017 chest CT. Hepatobiliary: Liver surface is diffusely irregular, compatible with hepatic cirrhosis. Ill-defined infiltrative 5.8 x 4.8 cm hypoenhancing central left liver lobe mass (series 2/image 19), previously 5.5 x 5.0 cm on 12/03/2017 CT using similar measurement technique, not appreciably changed. Stable moderate intrahepatic biliary ductal dilatation throughout the left lower lobe peripheral to this mass. Multiple (at least 5) liver masses scattered throughout the periphery of the liver have not appreciably changed, largest 3.6 cm in the anterior superior liver (series 2/image 13) and 3.5 cm in the far inferior right liver lobe (series 2/image 32). Stable position of percutaneous internal external biliary drain  traversing the right liver lobe and terminating within the descending duodenal lumen, with no significant intrahepatic biliary ductal dilatation in the right liver lobe. Expected pneumobilia within the nondependent gallbladder, which otherwise appears normal with no radiopaque cholelithiasis. Normal caliber common bile duct. Pancreas: Normal, with no mass or duct  dilation. Spleen: Stable moderate splenomegaly with craniocaudal splenic length 17.0 cm. No splenic mass. Adrenals/Urinary Tract: Stable bilateral adrenal masses measuring 5.1 cm on the right and 2.5 cm on the left. No hydronephrosis. Nonobstructing 2 mm lower left renal stone. No renal masses. Prominently distended and otherwise normal urinary bladder. Stomach/Bowel: Normal non-distended stomach. Normal caliber small bowel with no small bowel wall thickening. Normal appendix. Stable postsurgical changes from partial left colectomy. Large amount of stool throughout remnant colon and rectum. New mild circumferential rectal wall thickening with slight perirectal fat haziness. Rectal diameter 7.7 cm. No definite rectal pneumatosis or perirectal free air. No additional sites of large bowel wall thickening. Vascular/Lymphatic: Atherosclerotic nonaneurysmal abdominal aorta. Patent main and right portal, splenic, hepatic and renal veins. 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 Stable aortocaval adenopathy up to 1.8 cm (series 2/image 42). Stable enlarged 1.4 cm left para-aortic node (series 2/image 42). Stable enlarged 1.9 cm left mesenteric node (series 2/image 61). No new pathologically enlarged lymph nodes in the abdomen or pelvis. Reproductive: Mildly enlarged prostate. Other: No pneumoperitoneum. No focal fluid collection. Trace perihepatic ascites, decreased. Musculoskeletal: No aggressive appearing focal osseous lesions. Marked thoracolumbar spondylosis with stable bilateral L5 pars defects. IMPRESSION: 1. Prominently distended and otherwise normal  urinary bladder with mild prostatomegaly. Findings suggest acute bladder outlet obstruction. No hydronephrosis. 2. Large colorectal stool volume compatible with constipation. New mild circumferential rectal wall thickening and perirectal fat haziness suggesting stercoral colitis. No free air. No abscess. 3. Stable infiltrative cholangiocarcinoma in the central left liver lobe with associated peripheral intrahepatic biliary ductal dilatation throughout the left liver lobe. 4. Stable well-positioned internal external drain traversing the right liver lobe and decompressing the right liver biliary system. 5. Stable liver, bilateral adrenal, retroperitoneal, left mesenteric and right infrahilar nodal and left lung base metastases. No progressive metastatic disease since 12/03/2017 CT. 6. Cirrhosis. Trace perihepatic ascites, decreased. Stable moderate splenomegaly. 7.  Aortic Atherosclerosis (ICD10-I70.0). Electronically Signed   By: Ilona Sorrel M.D.   On: 12/22/2017 15:01   Dg Abd Acute W/chest  Result Date: 12/22/2017 CLINICAL DATA:  Metastatic cancer with constipation EXAM: DG ABDOMEN ACUTE W/ 1V CHEST COMPARISON:  12/03/2017 FINDINGS: Formed stool distends the colon diffusely. No small bowel dilatation or evident pneumoperitoneum. Percutaneous biliary catheter in unremarkable position. Porta catheter with tip at the upper cavoatrial junction. There is no edema, consolidation, effusion, or pneumothorax. Normal heart size IMPRESSION: Generalized stool retention consistent with history of constipation. Electronically Signed   By: Monte Fantasia M.D.   On: 12/22/2017 10:00    EKG: Not done.  Assessment/Plan Principal Problem:   Dehydration Active Problems:   Hypertension   Hyperlipemia   Diabetes mellitus ([place])   CAD (coronary artery disease)- PCI 2001- no evaluation since   Moderate protein-calorie malnutrition ([facility])   Alcoholic cirrhosis of liver without ascites ([facility])   Biliary obstruction due to  malignant neoplasm ([facility])   Transaminitis   Malignant neoplasm metastatic to adrenal gland ([facility])   Intrahepatic cholangiocarcinoma ([facility])   Cholangiocarcinoma ([facility])   Bladder outlet obstruction   Constipation   1 dehydration Patient looks clinically dehydrated on examination.  Patient presented with constipation and inability to urinate.  Patient also noted to have systolic blood pressure of 99-100 on presentation to the ED.  Patient received a 500 cc fluid bolus.  Patient on IV fluids which we will continue for now.  Give a 1 L fluid bolus.  Follow.  2.  Constipation Patient noted to present with  complaints of constipation.  Acute abdominal series as well as CT abdomen and pelvis consistent with significant stool burden.  We will give a soapsuds enema.  Place on MiraLAX 17 g p.o. twice daily as well as Senokot S p.o. twice daily.  Follow.  3.  Bladder outlet obstruction Noted on CT abdomen and pelvis.  Foley catheter has been ordered and awaiting to be placed.  Strict I's and O's.  Daily weights.  Once Foley catheter is placed will need a UA with cultures and sensitivities.  4.  Intrahepatic cholangiocarcinoma Status post internal/external drain traversing the right liver lobe and decompressing the right liver biliary system.  Patient receiving ongoing chemotherapy.  Patient noted to have bilirubin which is trending up and currently at 4.8 from 2.0 on 12/10/2017.  Spoke with [name] of oncology who recommended IR to evaluate for biliary stent placement.  Oncology will be following during this hospitalization.  5.  Anemia Check an anemia panel.  Follow H&H.  Transfusion threshold hemoglobin less than 7.  6.  Hypertension Patient's blood pressure currently borderline.  Continue IV fluids.  Hold anti-hypertensive medications.  Follow.  7.  Diabetes mellitus type 2 Hemoglobin A1c was 4.9 on 11/09/2017.  Monitor for now.  8.  Alcoholic liver cirrhosis Monitor closely with hydration.   Follow.   DVT prophylaxis: Lovenox Code Status: DNR Family Communication: Updated patient.  No family at bedside. Disposition Plan: Likely home once clinically improved and per oncology. Consults called: Oncology informed of admission via epic. Admission status: Place in observation.   Irine Seal MD [facility] Pager [phone]  If 7PM-7AM, please contact night-coverage [email or web address] Password [facility]  12/22/2017, 5:54 PM

## 2017-12-22 NOTE — ED Notes (Signed)
ED Provider at bedside. 

## 2017-12-22 NOTE — ED Notes (Signed)
Enema is being preformed. Patient is on bedside commode.

## 2017-12-22 NOTE — Progress Notes (Signed)
Matthew Lewis   DOB:12-20-1955   [id number]   QZE#:[id number]  Oncology f/u   Subjective: Patient is well-known to me, under my care for his metastatic cholangiocarcinoma.  He started first-line chemotherapy cisplatin and gemcitabine last week, tolerated well.  He presented to emergency room with decreased urine output, and constipation.  He previously had percutaneous biliary drainage tube placed, and the multiple exchange.  Due to his worsening bilirubin, the external drainage tube was uncapped and drained again since last Wednesday last week, and it has drained about 500cc daily. Pt denies fever or chills. I saw him in ED.    Objective:  Vitals:   12/22/17 1830 12/22/17 1930  BP: 115/78 117/71  Pulse: (!) 101 93  Resp: 18 18  Temp:    SpO2: 97% 98%    Body mass index is 29.29 kg/m.  Intake/Output Summary (Last 24 hours) at 12/22/2017 1956 Last data filed at 12/22/2017 1730 Gross per 24 hour  Intake -  Output 1960 ml  Net -1960 ml     Sclerae (+) jaundice   Oropharynx clear  No peripheral adenopathy  Lungs clear -- no rales or rhonchi  Heart regular rate and rhythm  Abdomen soft, (+) biliary drain tube at right side   MSK no focal spinal tenderness, no peripheral edema  Neuro nonfocal   CBG (last 3)  No results for input(s): GLUCAP in the last 72 hours.   Labs:  Lab Results  Component Value Date   WBC 6.5 12/22/2017   HGB 9.5 (L) 12/22/2017   HCT 28.0 (L) 12/22/2017   MCV 94.1 12/22/2017   PLT 300 12/22/2017   NEUTROABS 5.0 12/22/2017   CMP Latest Ref Rng & Units 12/22/2017 12/22/2017 12/17/2017  Glucose 70 - 99 mg/dL 144(H) 163(H) 147(H)  BUN 8 - 23 mg/dL 16 19 12   Creatinine 0.61 - 1.24 mg/dL 0.40(L) 0.54(L) 0.72  Sodium 135 - 145 mmol/L 135 132(L) 136  Potassium 3.5 - 5.1 mmol/L 4.2 4.0 4.3  Chloride 98 - 111 mmol/L 102 98 102  CO2 22 - 32 mmol/L - 26 27  Calcium 8.9 - 10.3 mg/dL - 9.0 9.0  Total Protein 6.5 - 8.1 g/dL - 8.0 7.8  Total Bilirubin  0.3 - 1.2 mg/dL - 4.8(H) 4.5(HH)  Alkaline Phos 38 - 126 U/L - 525(H) 1,076(H)  AST 15 - 41 U/L - 120(H) 155(H)  ALT 0 - 44 U/L - 143(H) 168(H)     Urine Studies No results for input(s): UHGB, CRYS in the last 72 hours.  Invalid input(s): UACOL, UAPR, USPG, UPH, UTP, UGL, UKET, UBIL, UNIT, UROB, ULEU, UEPI, UWBC, URBC, UBAC, CAST, UCOM, [place]  Basic Metabolic Panel: Recent Labs  Lab 12/17/17 1145 12/22/17 1004 12/22/17 1146  NA 136 132* 135  K 4.3 4.0 4.2  CL 102 98 102  CO2 27 26  --   GLUCOSE 147* 163* 144*  BUN 12 19 16   CREATININE 0.72 0.54* 0.40*  CALCIUM 9.0 9.0  --   MG  --  1.9  --    GFR Estimated Creatinine Clearance: 109.9 mL/min (A) (by C-G formula based on SCr of 0.4 mg/dL (L)). Liver Function Tests: Recent Labs  Lab 12/17/17 1145 12/22/17 1004  AST 155* 120*  ALT 168* 143*  ALKPHOS 1,076* 525*  BILITOT 4.5* 4.8*  PROT 7.8 8.0  ALBUMIN 2.5* 2.9*   Recent Labs  Lab 12/22/17 1004  LIPASE 26   No results for input(s): AMMONIA in the last 168 hours.  Coagulation profile Recent Labs  Lab 12/22/17 1004  INR 1.15    CBC: Recent Labs  Lab 12/17/17 1145 12/22/17 1004 12/22/17 1146  WBC 8.2 6.5  --   NEUTROABS 5.8 5.0  --   HGB 10.0* 9.4* 9.5*  HCT 30.7* 30.1* 28.0*  MCV 89.2 94.1  --   PLT 374 300  --    Cardiac Enzymes: No results for input(s): CKTOTAL, CKMB, CKMBINDEX, TROPONINI in the last 168 hours. BNP: Invalid input(s): POCBNP CBG: No results for input(s): GLUCAP in the last 168 hours. D-Dimer No results for input(s): DDIMER in the last 72 hours. Hgb A1c No results for input(s): HGBA1C in the last 72 hours. Lipid Profile No results for input(s): CHOL, HDL, LDLCALC, TRIG, CHOLHDL, LDLDIRECT in the last 72 hours. Thyroid function studies No results for input(s): TSH, T4TOTAL, T3FREE, THYROIDAB in the last 72 hours.  Invalid input(s): FREET3 Anemia work up Recent Labs    12/22/17 1733  VITAMINB12 624  FERRITIN 332  TIBC  287  IRON 91  RETICCTPCT 0.5   Microbiology No results found for this or any previous visit (from the past 240 hour(s)).    Studies:  Ct Abdomen Pelvis W Contrast  Result Date: 12/22/2017 CLINICAL DATA:  Intrahepatic cholangiocarcinoma, stage IV, with adrenal and retroperitoneal nodal metastases, diagnosed October 2019. Status post internal external percutaneous biliary drainage. Abdominal pain. Unable to micturate. Constipation. EXAM: CT ABDOMEN AND PELVIS WITH CONTRAST TECHNIQUE: Multidetector CT imaging of the abdomen and pelvis was performed using the standard protocol following bolus administration of intravenous contrast. CONTRAST:  140m ISOVUE-300 IOPAMIDOL (ISOVUE-300) INJECTION 61% COMPARISON:  12/03/2017 CT abdomen/pelvis. FINDINGS: Lower chest: Solid irregular 3.0 cm left lower lobe lung mass (series 6/image 38), stable. Bandlike consolidation in the basilar right lower lobe base slightly decreased, favoring evolving postinfectious/postinflammatory scarring. Coronary atherosclerosis. Tip of right sided Port-A-Cath is seen at the cavoatrial junction. Enlarged 1.8 cm right infrahilar node is stable since 10/27/2017 chest CT. Hepatobiliary: Liver surface is diffusely irregular, compatible with hepatic cirrhosis. Ill-defined infiltrative 5.8 x 4.8 cm hypoenhancing central left liver lobe mass (series 2/image 19), previously 5.5 x 5.0 cm on 12/03/2017 CT using similar measurement technique, not appreciably changed. Stable moderate intrahepatic biliary ductal dilatation throughout the left lower lobe peripheral to this mass. Multiple (at least 5) liver masses scattered throughout the periphery of the liver have not appreciably changed, largest 3.6 cm in the anterior superior liver (series 2/image 13) and 3.5 cm in the far inferior right liver lobe (series 2/image 32). Stable position of percutaneous internal external biliary drain traversing the right liver lobe and terminating within the  descending duodenal lumen, with no significant intrahepatic biliary ductal dilatation in the right liver lobe. Expected pneumobilia within the nondependent gallbladder, which otherwise appears normal with no radiopaque cholelithiasis. Normal caliber common bile duct. Pancreas: Normal, with no mass or duct dilation. Spleen: Stable moderate splenomegaly with craniocaudal splenic length 17.0 cm. No splenic mass. Adrenals/Urinary Tract: Stable bilateral adrenal masses measuring 5.1 cm on the right and 2.5 cm on the left. No hydronephrosis. Nonobstructing 2 mm lower left renal stone. No renal masses. Prominently distended and otherwise normal urinary bladder. Stomach/Bowel: Normal non-distended stomach. Normal caliber small bowel with no small bowel wall thickening. Normal appendix. Stable postsurgical changes from partial left colectomy. Large amount of stool throughout remnant colon and rectum. New mild circumferential rectal wall thickening with slight perirectal fat haziness. Rectal diameter 7.7 cm. No definite rectal pneumatosis or perirectal free air. No  additional sites of large bowel wall thickening. Vascular/Lymphatic: Atherosclerotic nonaneurysmal abdominal aorta. Patent main and right portal, splenic, hepatic and renal veins. 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 Stable aortocaval adenopathy up to 1.8 cm (series 2/image 42). Stable enlarged 1.4 cm left para-aortic node (series 2/image 42). Stable enlarged 1.9 cm left mesenteric node (series 2/image 61). No new pathologically enlarged lymph nodes in the abdomen or pelvis. Reproductive: Mildly enlarged prostate. Other: No pneumoperitoneum. No focal fluid collection. Trace perihepatic ascites, decreased. Musculoskeletal: No aggressive appearing focal osseous lesions. Marked thoracolumbar spondylosis with stable bilateral L5 pars defects. IMPRESSION: 1. Prominently distended and otherwise normal urinary bladder with mild prostatomegaly. Findings suggest acute  bladder outlet obstruction. No hydronephrosis. 2. Large colorectal stool volume compatible with constipation. New mild circumferential rectal wall thickening and perirectal fat haziness suggesting stercoral colitis. No free air. No abscess. 3. Stable infiltrative cholangiocarcinoma in the central left liver lobe with associated peripheral intrahepatic biliary ductal dilatation throughout the left liver lobe. 4. Stable well-positioned internal external drain traversing the right liver lobe and decompressing the right liver biliary system. 5. Stable liver, bilateral adrenal, retroperitoneal, left mesenteric and right infrahilar nodal and left lung base metastases. No progressive metastatic disease since 12/03/2017 CT. 6. Cirrhosis. Trace perihepatic ascites, decreased. Stable moderate splenomegaly. 7.  Aortic Atherosclerosis (ICD10-I70.0). Electronically Signed   By: Ilona Sorrel M.D.   On: 12/22/2017 15:01   Dg Abd Acute W/chest  Result Date: 12/22/2017 CLINICAL DATA:  Metastatic cancer with constipation EXAM: DG ABDOMEN ACUTE W/ 1V CHEST COMPARISON:  12/03/2017 FINDINGS: Formed stool distends the colon diffusely. No small bowel dilatation or evident pneumoperitoneum. Percutaneous biliary catheter in unremarkable position. Porta catheter with tip at the upper cavoatrial junction. There is no edema, consolidation, effusion, or pneumothorax. Normal heart size IMPRESSION: Generalized stool retention consistent with history of constipation. Electronically Signed   By: Monte Fantasia M.D.   On: 12/22/2017 10:00    Assessment: 62 y.o. with recently diagnosed metastatic cholangiocarcinoma, status post percutaneous biliary draining tube placement and exchange, first cycle chemotherapy last week, presented with urinary difficulty, constipation, and decreased oral intake.  1. Urinary difficulty with distended bladder, ? BPH, vs cystitis from chemo (cisplatin), normal renal function  2. Constipation 3. Poor oral  intake, dehydration, secondary to chemotherapy and his underlying malignancy 4.  Metastatic cholangiocarcinoma, status post first cycle chemotherapy cisplatin and gemcitabine on 12/18/17 5.  Obstructive jaundice, secondary to #4, recently biliary tube exchange by IR, worsening hyperbilirubinemia 6. Deconditioning  7. Malnutrition   Plan: -He has had foley placed, and getting laxities for his constipation  -he is being admitted to [facility], I spoke with ED physician and hospitalist Dr. Grandville Silos  -Please ask IR to evaluate his biliary drainage.  His CT scan did not show biliary obstruction in the left lobe, his current internal and external drainage tube is in the right lobe and CBD.  I previously discussed with Dr. Kathlene Cote last week, who thinks he may need additional drain placement in left lobe of liver  -I will f/u closely when he is in the hospital -will hold on chemo for this week.  -Patient understands the goal of his treatment is palliative, to prolong his life and improve his quality of life.  His cancer unfortunately is not curable.  We previously discussed CODE STATUS, and he is agreeable with DNR/DNI.    [name], MD 12/22/2017  7:56 PM

## 2017-12-23 ENCOUNTER — Encounter (HOSPITAL_COMMUNITY): Payer: Self-pay | Admitting: Interventional Radiology

## 2017-12-23 ENCOUNTER — Observation Stay (HOSPITAL_COMMUNITY): Payer: Medicare Other

## 2017-12-23 DIAGNOSIS — T451X5A Adverse effect of antineoplastic and immunosuppressive drugs, initial encounter: Secondary | ICD-10-CM | POA: Diagnosis present

## 2017-12-23 DIAGNOSIS — B9620 Unspecified Escherichia coli [E. coli] as the cause of diseases classified elsewhere: Secondary | ICD-10-CM | POA: Diagnosis present

## 2017-12-23 DIAGNOSIS — C801 Malignant (primary) neoplasm, unspecified: Secondary | ICD-10-CM | POA: Diagnosis not present

## 2017-12-23 DIAGNOSIS — R7881 Bacteremia: Secondary | ICD-10-CM | POA: Diagnosis not present

## 2017-12-23 DIAGNOSIS — T85590A Other mechanical complication of bile duct prosthesis, initial encounter: Secondary | ICD-10-CM | POA: Diagnosis not present

## 2017-12-23 DIAGNOSIS — R161 Splenomegaly, not elsewhere classified: Secondary | ICD-10-CM | POA: Diagnosis present

## 2017-12-23 DIAGNOSIS — F4310 Post-traumatic stress disorder, unspecified: Secondary | ICD-10-CM | POA: Diagnosis present

## 2017-12-23 DIAGNOSIS — N401 Enlarged prostate with lower urinary tract symptoms: Secondary | ICD-10-CM | POA: Diagnosis present

## 2017-12-23 DIAGNOSIS — R338 Other retention of urine: Secondary | ICD-10-CM | POA: Diagnosis present

## 2017-12-23 DIAGNOSIS — K7030 Alcoholic cirrhosis of liver without ascites: Secondary | ICD-10-CM | POA: Diagnosis present

## 2017-12-23 DIAGNOSIS — E876 Hypokalemia: Secondary | ICD-10-CM | POA: Diagnosis present

## 2017-12-23 DIAGNOSIS — E440 Moderate protein-calorie malnutrition: Secondary | ICD-10-CM | POA: Diagnosis present

## 2017-12-23 DIAGNOSIS — Z515 Encounter for palliative care: Secondary | ICD-10-CM | POA: Diagnosis not present

## 2017-12-23 DIAGNOSIS — Z6829 Body mass index (BMI) 29.0-29.9, adult: Secondary | ICD-10-CM | POA: Diagnosis not present

## 2017-12-23 DIAGNOSIS — C3490 Malignant neoplasm of unspecified part of unspecified bronchus or lung: Secondary | ICD-10-CM | POA: Diagnosis present

## 2017-12-23 DIAGNOSIS — R300 Dysuria: Secondary | ICD-10-CM | POA: Diagnosis present

## 2017-12-23 DIAGNOSIS — E1142 Type 2 diabetes mellitus with diabetic polyneuropathy: Secondary | ICD-10-CM | POA: Diagnosis present

## 2017-12-23 DIAGNOSIS — I10 Essential (primary) hypertension: Secondary | ICD-10-CM | POA: Diagnosis present

## 2017-12-23 DIAGNOSIS — E785 Hyperlipidemia, unspecified: Secondary | ICD-10-CM | POA: Diagnosis present

## 2017-12-23 DIAGNOSIS — N320 Bladder-neck obstruction: Secondary | ICD-10-CM | POA: Diagnosis not present

## 2017-12-23 DIAGNOSIS — E860 Dehydration: Secondary | ICD-10-CM | POA: Diagnosis not present

## 2017-12-23 DIAGNOSIS — Z85038 Personal history of other malignant neoplasm of large intestine: Secondary | ICD-10-CM | POA: Diagnosis not present

## 2017-12-23 DIAGNOSIS — R740 Nonspecific elevation of levels of transaminase and lactic acid dehydrogenase [LDH]: Secondary | ICD-10-CM

## 2017-12-23 DIAGNOSIS — Z7189 Other specified counseling: Secondary | ICD-10-CM | POA: Diagnosis not present

## 2017-12-23 DIAGNOSIS — K831 Obstruction of bile duct: Secondary | ICD-10-CM | POA: Diagnosis not present

## 2017-12-23 DIAGNOSIS — C7970 Secondary malignant neoplasm of unspecified adrenal gland: Secondary | ICD-10-CM | POA: Diagnosis not present

## 2017-12-23 DIAGNOSIS — K5900 Constipation, unspecified: Secondary | ICD-10-CM | POA: Diagnosis present

## 2017-12-23 DIAGNOSIS — C221 Intrahepatic bile duct carcinoma: Secondary | ICD-10-CM | POA: Diagnosis not present

## 2017-12-23 DIAGNOSIS — D638 Anemia in other chronic diseases classified elsewhere: Secondary | ICD-10-CM | POA: Diagnosis present

## 2017-12-23 HISTORY — PX: IR INT EXT BILIARY DRAIN WITH CHOLANGIOGRAM: IMG6044

## 2017-12-23 HISTORY — PX: IR CHOLANGIOGRAM EXISTING TUBE: IMG6040

## 2017-12-23 LAB — COMPREHENSIVE METABOLIC PANEL
ALT: 112 U/L — ABNORMAL HIGH (ref 0–44)
AST: 86 U/L — ABNORMAL HIGH (ref 15–41)
Albumin: 2.5 g/dL — ABNORMAL LOW (ref 3.5–5.0)
Alkaline Phosphatase: 419 U/L — ABNORMAL HIGH (ref 38–126)
Anion gap: 6 (ref 5–15)
BUN: 13 mg/dL (ref 8–23)
CO2: 23 mmol/L (ref 22–32)
Calcium: 8.5 mg/dL — ABNORMAL LOW (ref 8.9–10.3)
Chloride: 102 mmol/L (ref 98–111)
Creatinine, Ser: 0.52 mg/dL — ABNORMAL LOW (ref 0.61–1.24)
GFR calc Af Amer: >60 mL/min (ref >60)
GFR calc non Af Amer: >60 mL/min (ref >60)
GLUCOSE: 178 mg/dL — AB (ref 70–99)
Potassium: 3.9 mmol/L (ref 3.5–5.1)
Sodium: 131 mmol/L — ABNORMAL LOW (ref 135–145)
Total Bilirubin: 3.2 mg/dL — ABNORMAL HIGH (ref 0.3–1.2)
Total Protein: 6.9 g/dL (ref 6.5–8.1)

## 2017-12-23 LAB — CBC
HCT: 27.7 % — ABNORMAL LOW (ref 39.0–52.0)
Hemoglobin: 8.5 g/dL — ABNORMAL LOW (ref 13.0–17.0)
MCH: 28.5 pg (ref 26.0–34.0)
MCHC: 30.7 g/dL (ref 30.0–36.0)
MCV: 93 fL (ref 80.0–100.0)
Platelets: 256 10*3/uL (ref 150–400)
RBC: 2.98 MIL/uL — ABNORMAL LOW (ref 4.22–5.81)
RDW: 18.4 % — ABNORMAL HIGH (ref 11.5–15.5)
WBC: 5.4 10*3/uL (ref 4.0–10.5)
nRBC: 0 % (ref 0.0–0.2)

## 2017-12-23 LAB — PROTIME-INR
INR: 1.18
Prothrombin Time: 14.9 seconds (ref 11.4–15.2)

## 2017-12-23 MED ORDER — ACETAMINOPHEN 325 MG PO TABS
650.0000 mg | ORAL_TABLET | ORAL | Status: DC | PRN
  Administered 2017-12-23 – 2017-12-27 (×4): 650 mg via ORAL
  Filled 2017-12-23 (×4): qty 2

## 2017-12-23 MED ORDER — LIDOCAINE HCL 1 % IJ SOLN
INTRAMUSCULAR | Status: AC
  Filled 2017-12-23: qty 20

## 2017-12-23 MED ORDER — SODIUM CHLORIDE 0.9 % IV SOLN
1.0000 g | Freq: Once | INTRAVENOUS | Status: AC
  Administered 2017-12-23: 1 g via INTRAVENOUS
  Filled 2017-12-23 (×2): qty 1

## 2017-12-23 MED ORDER — MIDAZOLAM HCL 2 MG/2ML IJ SOLN
INTRAMUSCULAR | Status: AC | PRN
  Administered 2017-12-23 (×4): 1 mg via INTRAVENOUS

## 2017-12-23 MED ORDER — IOPAMIDOL (ISOVUE-300) INJECTION 61%
50.0000 mL | Freq: Once | INTRAVENOUS | Status: AC | PRN
  Administered 2017-12-23: 20 mL

## 2017-12-23 MED ORDER — FENTANYL CITRATE (PF) 100 MCG/2ML IJ SOLN
INTRAMUSCULAR | Status: AC
  Filled 2017-12-23: qty 2

## 2017-12-23 MED ORDER — FENTANYL CITRATE (PF) 100 MCG/2ML IJ SOLN
INTRAMUSCULAR | Status: AC | PRN
  Administered 2017-12-23: 25 ug via INTRAVENOUS

## 2017-12-23 MED ORDER — SODIUM CHLORIDE 0.9% FLUSH
5.0000 mL | Freq: Three times a day (TID) | INTRAVENOUS | Status: DC
  Administered 2017-12-24 – 2017-12-30 (×12): 5 mL

## 2017-12-23 MED ORDER — MIDAZOLAM HCL 2 MG/2ML IJ SOLN
INTRAMUSCULAR | Status: AC
  Filled 2017-12-23: qty 6

## 2017-12-23 MED ORDER — IOPAMIDOL (ISOVUE-300) INJECTION 61%
INTRAVENOUS | Status: AC
  Administered 2017-12-23: 20 mL
  Filled 2017-12-23: qty 50

## 2017-12-23 NOTE — Sedation Documentation (Signed)
Pt very anxious

## 2017-12-23 NOTE — Sedation Documentation (Signed)
Vitals stable, ST 104.

## 2017-12-23 NOTE — Evaluation (Signed)
Physical Therapy Evaluation Patient Details Name: Matthew Lewis MRN: [id number] DOB: 1955-07-22 Today's Date: 12/23/2017   History of Present Illness  62 y.o. male with medical history significant for intrahepatic cholangiocarcinoma, metastatic to liver and lung, type 2 diabetes, prior history of alcohol abuse, cirrhosis, status post percutaneous biliary drainage drainage tube for biliary obstruction recently d/c from hospital returns with constipation and inability to urinate.  Clinical Impression  Pt admitted with above diagnosis. Pt currently with functional limitations due to the deficits listed below (see PT Problem List). Pt will benefit from skilled PT to increase their independence and safety with mobility to allow discharge to the venue listed below.  Pt ambulated with RW today, per his request, but not heavily reliant on it.  Do not feel he will nee HHPT, but will continue to monitor.     Follow Up Recommendations No PT follow up    Equipment Recommendations  None recommended by PT    Recommendations for Other Services       Precautions / Restrictions Precautions Precautions: None Precaution Comments: drain on right with leg bag, foley catheter Restrictions Weight Bearing Restrictions: No      Mobility  Bed Mobility Overal bed mobility: Modified Independent                Transfers Overall transfer level: Modified independent   Transfers: Sit to/from Stand Sit to Stand: Modified independent (Device/Increase time)            Ambulation/Gait Ambulation/Gait assistance: Min guard;Supervision Gait Distance (Feet): 750 Feet Assistive device: Rolling walker (2 wheeled) Gait Pattern/deviations: Step-through pattern     General Gait Details: RW for steadiness, but not heavily reliant on it.  Stairs            Wheelchair Mobility    Modified Rankin (Stroke Patients Only)       Balance Overall balance assessment: Needs assistance    Sitting balance-Matthew Lewis Scale: Good       Standing balance-Matthew Lewis Scale: Fair                               Pertinent Vitals/Pain Pain Assessment: No/denies pain    Home Living Family/patient expects to be discharged to:: Private residence Living Arrangements: Spouse/significant other Available Help at Discharge: Family Type of Home: House Home Access: Stairs to enter Entrance Stairs-Rails: Psychiatric nurse of Steps: 5 on one entrance, 8 on another bilateral rails Home Layout: Able to live on main level with bedroom/bathroom;Multi-level Home Equipment: Environmental consultant - 2 wheels      Prior Function Level of Independence: Independent               Hand Dominance        Extremity/Trunk Assessment   Upper Extremity Assessment Upper Extremity Assessment: Overall WFL for tasks assessed    Lower Extremity Assessment Lower Extremity Assessment: Overall WFL for tasks assessed RLE Sensation: history of peripheral neuropathy LLE Sensation: history of peripheral neuropathy    Cervical / Trunk Assessment Cervical / Trunk Assessment: Normal  Communication   Communication: No difficulties  Cognition Arousal/Alertness: Awake/alert Behavior During Therapy: WFL for tasks assessed/performed Overall Cognitive Status: Within Functional Limits for tasks assessed                                 General Comments: During session, pt found out he may be getting  another drain today.  His demeanor changed a bit and appeared more pensive and stressed. Called wife upon return to the room to discuss with her.      General Comments      Exercises     Assessment/Plan    PT Assessment Patient needs continued PT services  PT Problem List Decreased mobility;Decreased activity tolerance;Decreased balance       PT Treatment Interventions Therapeutic activities;Gait training;Therapeutic exercise;Stair training;Functional mobility training    PT  Goals (Current goals can be found in the Care Plan section)  Acute Rehab PT Goals Patient Stated Goal: to go home PT Goal Formulation: With patient Time For Goal Achievement: 01/06/18 Potential to Achieve Goals: Good    Frequency Min 3X/week   Barriers to discharge        Co-evaluation               AM-PAC PT "6 Clicks" Mobility  Outcome Measure Help needed turning from your back to your side while in a flat bed without using bedrails?: None Help needed moving from lying on your back to sitting on the side of a flat bed without using bedrails?: None Help needed moving to and from a bed to a chair (including a wheelchair)?: None Help needed standing up from a chair using your arms (e.g., wheelchair or bedside chair)?: None Help needed to walk in hospital room?: A Little Help needed climbing 3-5 steps with a railing? : A Little 6 Click Score: 22    End of Session   Activity Tolerance: Patient tolerated treatment well Patient left: in chair;with call bell/phone within reach Nurse Communication: Mobility status PT Visit Diagnosis: Difficulty in walking, not elsewhere classified (R26.2)    Time: 0962-8366 PT Time Calculation (min) (ACUTE ONLY): 19 min   Charges:   PT Evaluation $PT Eval Low Complexity: 1 Low          [name], [place] Pager [phone] 12/23/2017   [name] 12/23/2017, 10:17 AM

## 2017-12-23 NOTE — Sedation Documentation (Signed)
ST, vitals stable. Pt has no complaints at this time.

## 2017-12-23 NOTE — Care Management Obs Status (Signed)
[place] NOTIFICATION   Patient Details  Name: Matthew Lewis MRN: [id number] Date of Birth: 06-30-1955   Medicare Observation Status Notification Given:       Lynnell Catalan, RN 12/23/2017, 2:28 PM

## 2017-12-23 NOTE — Progress Notes (Signed)
PROGRESS NOTE    Matthew Lewis  VOZ:[id number] DOB: 1955/03/21 DOA: 12/22/2017 PCP: [name], MD    Brief Narrative:  Patient 62 year old gentleman history of intrahepatic cholangiocarcinoma with metastases to the liver and lung on chemotherapy last chemo therapy 4 days prior to admission, prior history of alcohol use with cirrhosis, status post percutaneous biliary drainage tube for biliary obstruction, recent hospitalization December 03, 2017 to December 11, 2017 for septic shock secondary to ESBL E. coli bacteremia status post 2 weeks of IV antibiotics presented to the ED with inability to urinate and constipation.  Patient noted to be dehydrated on admission.  Oncology had recommended IR evaluation for biliary drainage/stent placement to the left due to rising bilirubin levels.   Assessment & Plan:   Principal Problem:   Dehydration Active Problems:   Hypertension   Hyperlipemia   Diabetes mellitus ([place])   CAD (coronary artery disease)- PCI 2001- no evaluation since   Moderate protein-calorie malnutrition (HCC)   Alcoholic cirrhosis of liver without ascites (HCC)   Biliary obstruction due to malignant neoplasm (HCC)   Transaminitis   Malignant neoplasm metastatic to adrenal gland (HCC)   Intrahepatic cholangiocarcinoma (HCC)   Cholangiocarcinoma (HCC)   Bladder outlet obstruction   Constipation  1 dehydration Patient looked clinically dehydrated on examination on presentation.  Patient had presented with constipation inability to urinate.  Blood pressure was borderline.  Continue gentle hydration with IV fluids and follow.  2.  Constipation Patient noted to present with complaints of constipation.  Acute abdominal series as well as CT abdomen and pelvis consistent with significant stool burden.  Patient given soapsuds enema last night with 2 large bowel movements.  Continue current bowel regimen of MiraLAX 17 g twice daily and Senokot-S twice daily.  Follow.  3.  Bladder outlet obstruction Noted on CT abdomen and pelvis.  Foley catheter placed with urine output of 1.3 L after Foley catheter placement.  Clinical improvement.  Strict I's and O's.  Could have a voiding trial in the next 1 to 2 days.  If patient fails voiding trial will need Foley catheter placed with outpatient follow-up with urology.    4.  Intrahepatic cholangiocarcinoma Status post internal/external drain traversing the right liver lobe and decompressing the right liver biliary system.  Patient receiving ongoing chemotherapy.  Patient noted to have bilirubin which is trending up and currently at 4.8 (12/22/2017) from 2.0 on 12/10/2017.  Spoke with [name] of oncology who recommended IR to evaluate for biliary stent/drainage placement.  IR consulted and patient for  biliary drain placement with cholangiogram this afternoon.  Oncology following.   5.  Anemia Anemia panel consistent with anemia of chronic disease.  Hemoglobin currently at 8.5.  Transfusion threshold hemoglobin less than 7.  Follow.   6.  Hypertension Patient's blood pressure was borderline on admission.  Improved with hydration.  Follow.   7.  Diabetes mellitus type 2 Hemoglobin A1c was 4.9 on 11/09/2017.  CBG was 94 this morning.  Monitor for now.  8.  Alcoholic liver cirrhosis Monitor closely with hydration.  Continue gentle hydration.  Follow.   DVT prophylaxis: Lovenox Code Status: Full Family Communication: Updated patient.  Updated wife. Disposition Plan: Hopefully home once clinically improved and when okay with oncology.   Consultants:   Oncology: [name] 12/22/2017  Interventional radiology  Procedures:   CT abdomen and pelvis 12/22/2017  Acute abdominal series 12/22/2017  Antimicrobials:   None   Subjective: Patient states had large bowel movement  this morning and another large bowel movement last night.  Denies any further abdominal pain or distention.  Foley catheter in  place.  Patient asking when Foley catheter may be removed.  Objective: Vitals:   12/22/17 2000 12/22/17 2030 12/22/17 2120 12/23/17 0525  BP: 113/69 108/69 130/76 109/73  Pulse: 97 95 (!) 111 94  Resp: 15 17 18 16   Temp:   98.2 F (36.8 C) 98 F (36.7 C)  TempSrc:   Oral Oral  SpO2: 100% 99% 98% 99%  Weight:      Height:   5' 11"  (1.803 m)     Intake/Output Summary (Last 24 hours) at 12/23/2017 1141 Last data filed at 12/23/2017 0530 Gross per 24 hour  Intake 2375.96 ml  Output 2670 ml  Net -294.04 ml   Filed Weights   12/22/17 0818  Weight: 95.3 kg    Examination:  General exam: Appears calm and comfortable  Respiratory system: Clear to auscultation. Respiratory effort normal. Cardiovascular system: S1 & S2 heard, RRR. No JVD, murmurs, rubs, gallops or clicks. No pedal edema. Gastrointestinal system: Abdomen is nondistended, soft and nontender. No organomegaly or masses felt. Normal bowel sounds heard. Central nervous system: Alert and oriented. No focal neurological deficits. Extremities: Symmetric 5 x 5 power. Skin: No rashes, lesions or ulcers Psychiatry: Judgement and insight appear normal. Mood & affect appropriate.     Data Reviewed: I have personally reviewed following labs and imaging studies  CBC: Recent Labs  Lab 12/17/17 1145 12/22/17 1004 12/22/17 1146 12/23/17 0452  WBC 8.2 6.5  --  5.4  NEUTROABS 5.8 5.0  --   --   HGB 10.0* 9.4* 9.5* 8.5*  HCT 30.7* 30.1* 28.0* 27.7*  MCV 89.2 94.1  --  93.0  PLT 374 300  --  119   Basic Metabolic Panel: Recent Labs  Lab 12/17/17 1145 12/22/17 1004 12/22/17 1146 12/23/17 0452  NA 136 132* 135 131*  K 4.3 4.0 4.2 3.9  CL 102 98 102 102  CO2 27 26  --  23  GLUCOSE 147* 163* 144* 178*  BUN 12 19 16 13   CREATININE 0.72 0.54* 0.40* 0.52*  CALCIUM 9.0 9.0  --  8.5*  MG  --  1.9  --   --    GFR: Estimated Creatinine Clearance: 109.9 mL/min (A) (by C-G formula based on SCr of 0.52 mg/dL (L)). Liver  Function Tests: Recent Labs  Lab 12/17/17 1145 12/22/17 1004 12/23/17 0452  AST 155* 120* 86*  ALT 168* 143* 112*  ALKPHOS 1,076* 525* 419*  BILITOT 4.5* 4.8* 3.2*  PROT 7.8 8.0 6.9  ALBUMIN 2.5* 2.9* 2.5*   Recent Labs  Lab 12/22/17 1004  LIPASE 26   No results for input(s): AMMONIA in the last 168 hours. Coagulation Profile: Recent Labs  Lab 12/22/17 1004 12/23/17 0452  INR 1.15 1.18   Cardiac Enzymes: No results for input(s): CKTOTAL, CKMB, CKMBINDEX, TROPONINI in the last 168 hours. BNP (last 3 results) No results for input(s): PROBNP in the last 8760 hours. HbA1C: No results for input(s): HGBA1C in the last 72 hours. CBG: Recent Labs  Lab 12/22/17 2209  GLUCAP 176*   Lipid Profile: No results for input(s): CHOL, HDL, LDLCALC, TRIG, CHOLHDL, LDLDIRECT in the last 72 hours. Thyroid Function Tests: No results for input(s): TSH, T4TOTAL, FREET4, T3FREE, THYROIDAB in the last 72 hours. Anemia Panel: Recent Labs    12/22/17 1733  VITAMINB12 624  FOLATE 25.2  FERRITIN 332  TIBC 287  IRON 91  RETICCTPCT 0.5   Sepsis Labs: No results for input(s): PROCALCITON, LATICACIDVEN in the last 168 hours.  No results found for this or any previous visit (from the past 240 hour(s)).       Radiology Studies: Ct Abdomen Pelvis W Contrast  Result Date: 12/22/2017 CLINICAL DATA:  Intrahepatic cholangiocarcinoma, stage IV, with adrenal and retroperitoneal nodal metastases, diagnosed October 2019. Status post internal external percutaneous biliary drainage. Abdominal pain. Unable to micturate. Constipation. EXAM: CT ABDOMEN AND PELVIS WITH CONTRAST TECHNIQUE: Multidetector CT imaging of the abdomen and pelvis was performed using the standard protocol following bolus administration of intravenous contrast. CONTRAST:  161m ISOVUE-300 IOPAMIDOL (ISOVUE-300) INJECTION 61% COMPARISON:  12/03/2017 CT abdomen/pelvis. FINDINGS: Lower chest: Solid irregular 3.0 cm left lower lobe  lung mass (series 6/image 38), stable. Bandlike consolidation in the basilar right lower lobe base slightly decreased, favoring evolving postinfectious/postinflammatory scarring. Coronary atherosclerosis. Tip of right sided Port-A-Cath is seen at the cavoatrial junction. Enlarged 1.8 cm right infrahilar node is stable since 10/27/2017 chest CT. Hepatobiliary: Liver surface is diffusely irregular, compatible with hepatic cirrhosis. Ill-defined infiltrative 5.8 x 4.8 cm hypoenhancing central left liver lobe mass (series 2/image 19), previously 5.5 x 5.0 cm on 12/03/2017 CT using similar measurement technique, not appreciably changed. Stable moderate intrahepatic biliary ductal dilatation throughout the left lower lobe peripheral to this mass. Multiple (at least 5) liver masses scattered throughout the periphery of the liver have not appreciably changed, largest 3.6 cm in the anterior superior liver (series 2/image 13) and 3.5 cm in the far inferior right liver lobe (series 2/image 32). Stable position of percutaneous internal external biliary drain traversing the right liver lobe and terminating within the descending duodenal lumen, with no significant intrahepatic biliary ductal dilatation in the right liver lobe. Expected pneumobilia within the nondependent gallbladder, which otherwise appears normal with no radiopaque cholelithiasis. Normal caliber common bile duct. Pancreas: Normal, with no mass or duct dilation. Spleen: Stable moderate splenomegaly with craniocaudal splenic length 17.0 cm. No splenic mass. Adrenals/Urinary Tract: Stable bilateral adrenal masses measuring 5.1 cm on the right and 2.5 cm on the left. No hydronephrosis. Nonobstructing 2 mm lower left renal stone. No renal masses. Prominently distended and otherwise normal urinary bladder. Stomach/Bowel: Normal non-distended stomach. Normal caliber small bowel with no small bowel wall thickening. Normal appendix. Stable postsurgical changes from  partial left colectomy. Large amount of stool throughout remnant colon and rectum. New mild circumferential rectal wall thickening with slight perirectal fat haziness. Rectal diameter 7.7 cm. No definite rectal pneumatosis or perirectal free air. No additional sites of large bowel wall thickening. Vascular/Lymphatic: Atherosclerotic nonaneurysmal abdominal aorta. Patent main and right portal, splenic, hepatic and renal veins. 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 Stable aortocaval adenopathy up to 1.8 cm (series 2/image 42). Stable enlarged 1.4 cm left para-aortic node (series 2/image 42). Stable enlarged 1.9 cm left mesenteric node (series 2/image 61). No new pathologically enlarged lymph nodes in the abdomen or pelvis. Reproductive: Mildly enlarged prostate. Other: No pneumoperitoneum. No focal fluid collection. Trace perihepatic ascites, decreased. Musculoskeletal: No aggressive appearing focal osseous lesions. Marked thoracolumbar spondylosis with stable bilateral L5 pars defects. IMPRESSION: 1. Prominently distended and otherwise normal urinary bladder with mild prostatomegaly. Findings suggest acute bladder outlet obstruction. No hydronephrosis. 2. Large colorectal stool volume compatible with constipation. New mild circumferential rectal wall thickening and perirectal fat haziness suggesting stercoral colitis. No free air. No abscess. 3. Stable infiltrative cholangiocarcinoma in the central left liver lobe with associated  peripheral intrahepatic biliary ductal dilatation throughout the left liver lobe. 4. Stable well-positioned internal external drain traversing the right liver lobe and decompressing the right liver biliary system. 5. Stable liver, bilateral adrenal, retroperitoneal, left mesenteric and right infrahilar nodal and left lung base metastases. No progressive metastatic disease since 12/03/2017 CT. 6. Cirrhosis. Trace perihepatic ascites, decreased. Stable moderate splenomegaly. 7.  Aortic  Atherosclerosis (ICD10-I70.0). Electronically Signed   By: Ilona Sorrel M.D.   On: 12/22/2017 15:01   Dg Abd Acute W/chest  Result Date: 12/22/2017 CLINICAL DATA:  Metastatic cancer with constipation EXAM: DG ABDOMEN ACUTE W/ 1V CHEST COMPARISON:  12/03/2017 FINDINGS: Formed stool distends the colon diffusely. No small bowel dilatation or evident pneumoperitoneum. Percutaneous biliary catheter in unremarkable position. Porta catheter with tip at the upper cavoatrial junction. There is no edema, consolidation, effusion, or pneumothorax. Normal heart size IMPRESSION: Generalized stool retention consistent with history of constipation. Electronically Signed   By: Monte Fantasia M.D.   On: 12/22/2017 10:00        Scheduled Meds: . aspirin EC  81 mg Oral Daily  . celecoxib  200 mg Oral QHS  . enoxaparin (LOVENOX) injection  40 mg Subcutaneous Q24H  . feeding supplement (ENSURE ENLIVE)  237 mL Oral TID WC  . gabapentin  400 mg Oral Daily   And  . gabapentin  800 mg Oral QHS  . loratadine  10 mg Oral Daily  . multivitamin with minerals  1 tablet Oral Daily  . polyethylene glycol  17 g Oral BID  . senna-docusate  1 tablet Oral BID  . sodium chloride flush  3 mL Intravenous Q12H   Continuous Infusions: . sodium chloride 75 mL/hr at 12/23/17 1032  . meropenem (MERREM) IV       LOS: 0 days    Time spent: 40 minutes    Irine Seal, MD [facility] Pager [phone]  If 7PM-7AM, please contact night-coverage [email or web address] Password [email or web address] 12/23/2017, 11:41 AM

## 2017-12-23 NOTE — Sedation Documentation (Signed)
Pt being prepped on IR table at this time with no complaints

## 2017-12-23 NOTE — Sedation Documentation (Signed)
ST-106, vitals stable. Procedure continues

## 2017-12-23 NOTE — Sedation Documentation (Signed)
ST, vitals stable. Pt responds appropriately to questions. Denies pain at this time.

## 2017-12-23 NOTE — Progress Notes (Signed)
Referring Physician(s): Feng,Y  Supervising Physician: Jacqulynn Cadet  Patient Status:  [name] - In-pt  Chief Complaint:  Metastatic cholangiocarcinoma  Subjective: Patient familiar to IR service from prior right hepatic lobe PTC and internal/external biliary drain on 10/26/2017 followed by exchange on 11/07/17 and 12/09/17 followed by capping of drain.  He has a history of stage IV metastatic cholangiocarcinoma.  He has been receiving chemotherapy.  Patient presented to the emergency room 12/16 with decreased urine output, constipation and some abdominal discomfort.  Due to worsening bilirubin the external drain was uncapped.  Follow-up CT done yesterday revealed prominently distended bladder with mildly enlarged prostate, stable infiltrative cholangiocarcinoma in the central left liver with associated peripheral intrahepatic biliary ductal dilatation throughout the left lobe and stable well-positioned internal/external drain of the right liver, no progressive metastatic disease, cirrhosis, trace ascites, stable moderate splenomegaly.  Currently afebrile, WBC normal, hemoglobin 8.5, 256k, PT/INR normal, creatinine 0.52 total bilirubin 3.2 previously 4.8.  Drain output today 970 cc brown fluid; request now received for follow-up cholangiogram with possible left hepatic biliary drain placement.  Patient currently denies fever, headache, chest pain, worsening dyspnea, cough, worsening abdominal pain, vomiting or bleeding.  He does have some intermittent nausea.  Past Medical History:  Diagnosis Date  . Abscess of anal and rectal regions    horse shoe abscess  . Alcoholic cirrhosis ([place])   . Arthritis    "everywhere; especially in my spine"  . Cirrhosis ([place])    STAGE 1 CIRRHOSIS PATIENT SEES DR NAMDIGAM FOR  . Colon cancer ([place]) 05/2005   TX SURGERY WITH LYMPH NODE REMOVAL  . Cough    LAST FEW WEEKS SAW DR RAMOS LUNGS MILKY SPUTUM OCC  . DM type 2 with diabetic peripheral neuropathy ([facility])     left foot  . Fatigue   . Fistula, anal    multiple  . History of substance abuse ([place])    last alcohol was 08/2011; last marijuania was 08/2011  . Hypertension   . PTSD (post-traumatic stress disorder)    "severe" HX OF  . Sleep deprivation    Past Surgical History:  Procedure Laterality Date  . ABSCESS DRAINAGE     "probably 15-20 so far at least; rectal"  . ANAL FISTULECTOMY  12/13/1998  . ANTERIOR FUSION CERVICAL SPINE  2010   "triple"; "did 2 surgeries on the same day in 0630 due to complications"  . CORONARY ANGIOPLASTY  2001  . detached muscle  2010   right chest; "after cervical fusion complications"  . ELBOW SURGERY  2007   "cut out part of a muscle"; left  . EXAMINATION UNDER ANESTHESIA  03/22/2005   fistula  . EXAMINATION UNDER ANESTHESIA  06/06/2011   Procedure: EXAM UNDER ANESTHESIA;  Surgeon: Stark Klein, MD;  Location: [place];  Service: General;  Laterality: N/A;  . HEMICOLECTOMY  06/06/2005   left  . INCISION AND DRAINAGE PERIRECTAL ABSCESS  03/30/2005  . INCISION AND DRAINAGE PERIRECTAL ABSCESS  12/13/2004  . INCISION AND DRAINAGE PERIRECTAL ABSCESS  07/14/2001  . INCISION AND DRAINAGE PERIRECTAL ABSCESS  08/12/2010   horseshoe abscess; Dr Redmond Pulling  . INCISION AND DRAINAGE PERIRECTAL ABSCESS  06/06/2011   Procedure: IRRIGATION AND DEBRIDEMENT PERIRECTAL ABSCESS;  Surgeon: Stark Klein, MD;  Location: [place];  Service: General;  Laterality: N/A;  . INCISION AND DRAINAGE PERIRECTAL ABSCESS N/A 02/24/2017   Procedure: IRRIGATION AND DEBRIDEMENT PERIRECTAL ABSCESS;  Surgeon: Ralene Ok, MD;  Location: [place];  Service: General;  Laterality: N/A;  .  IR EXCHANGE BILIARY DRAIN  11/07/2017  . IR EXCHANGE BILIARY DRAIN  12/09/2017  . IR IMAGING GUIDED PORT INSERTION  12/09/2017  . IR INT EXT BILIARY DRAIN WITH CHOLANGIOGRAM  10/26/2017  . IR PATIENT EVAL TECH 0-60 MINS  12/02/2017  . IR RADIOLOGIST EVAL & MGMT  11/06/2017  . [place]  . PERCUTANEOUS PINNING  PHALANX FRACTURE OF HAND  ~ 2008   "plates in 2 places"; right  . PLACEMENT OF SETON N/A 09/05/2017   Procedure: PLACEMENT OF SETON;  Surgeon: Leighton Ruff, MD;  Location: [facility];  Service: General;  Laterality: N/A;  . RADIOLOGY WITH ANESTHESIA N/A 10/25/2017   Procedure: MRI WITH ANESTHESIA;  Surgeon: Radiologist, Medication, MD;  Location: [place];  Service: Radiology;  Laterality: N/A;  . THORACIC DISCECTOMY  1990's      Allergies: Morphine and related  Medications: Prior to Admission medications   Medication Sig Start Date End Date Taking? Authorizing Provider  aspirin EC 81 MG tablet Take 81 mg by mouth daily.   Yes [provider]  calcium carbonate (TUMS - DOSED IN MG ELEMENTAL CALCIUM) 500 MG chewable tablet Chew 1 tablet by mouth 2 (two) times daily as needed for indigestion or heartburn.   Yes [provider]  celecoxib (CELEBREX) 200 MG capsule Take 200 mg by mouth at bedtime.  10/10/17  Yes [provider]  fexofenadine (ALLEGRA) 60 MG tablet Take 60 mg by mouth 2 (two) times daily.   Yes [provider]  gabapentin (NEURONTIN) 400 MG capsule Take 400-800 mg by mouth See admin instructions. Take 400 mg by mouth in the morning and 800 mg in the evening   Yes [provider]  lactose free nutrition (BOOST PLUS) LIQD Take 237 mLs by mouth 3 (three) times daily with meals. 12/11/17  Yes Regalado, Belkys A, MD  Multiple Vitamin (MULTIVITAMIN) tablet Take 1 tablet by mouth daily.   Yes [provider]  ondansetron (ZOFRAN) 8 MG tablet Take 1 tablet (8 mg total) by mouth 2 (two) times daily as needed. Start on the third day after chemotherapy. 11/06/17  Yes [name], MD  prochlorperazine (COMPAZINE) 10 MG tablet Take 1 tablet (10 mg total) by mouth every 6 (six) hours as needed (Nausea or vomiting). Patient not taking: Reported on 12/22/2017 11/06/17   [name], MD     Vital Signs: BP 109/73 (BP Location:  Right Arm)   Pulse 94   Temp 98 F (36.7 C) (Oral)   Resp 16   Ht 5' 11"  (1.803 m)   Wt 210 lb (95.3 kg)   SpO2 99%   BMI 29.29 kg/m   Physical Exam awake, alert.  Clean, intact right chest wall Port-A-Cath, chest with slightly diminished breath sounds bases, heart with slightly tachycardic but regular rhythm.  Abdomen slightly distended, positive bowel sounds, intact right-sided biliary drain with brown fluid in JP bulb, not sig tender Imaging: Ct Abdomen Pelvis W Contrast  Result Date: 12/22/2017 CLINICAL DATA:  Intrahepatic cholangiocarcinoma, stage IV, with adrenal and retroperitoneal nodal metastases, diagnosed October 2019. Status post internal external percutaneous biliary drainage. Abdominal pain. Unable to micturate. Constipation. EXAM: CT ABDOMEN AND PELVIS WITH CONTRAST TECHNIQUE: Multidetector CT imaging of the abdomen and pelvis was performed using the standard protocol following bolus administration of intravenous contrast. CONTRAST:  160m ISOVUE-300 IOPAMIDOL (ISOVUE-300) INJECTION 61% COMPARISON:  12/03/2017 CT abdomen/pelvis. FINDINGS: Lower chest: Solid irregular 3.0 cm left lower lobe lung mass (series 6/image  38), stable. Bandlike consolidation in the basilar right lower lobe base slightly decreased, favoring evolving postinfectious/postinflammatory scarring. Coronary atherosclerosis. Tip of right sided Port-A-Cath is seen at the cavoatrial junction. Enlarged 1.8 cm right infrahilar node is stable since 10/27/2017 chest CT. Hepatobiliary: Liver surface is diffusely irregular, compatible with hepatic cirrhosis. Ill-defined infiltrative 5.8 x 4.8 cm hypoenhancing central left liver lobe mass (series 2/image 19), previously 5.5 x 5.0 cm on 12/03/2017 CT using similar measurement technique, not appreciably changed. Stable moderate intrahepatic biliary ductal dilatation throughout the left lower lobe peripheral to this mass. Multiple (at least 5) liver masses scattered throughout the  periphery of the liver have not appreciably changed, largest 3.6 cm in the anterior superior liver (series 2/image 13) and 3.5 cm in the far inferior right liver lobe (series 2/image 32). Stable position of percutaneous internal external biliary drain traversing the right liver lobe and terminating within the descending duodenal lumen, with no significant intrahepatic biliary ductal dilatation in the right liver lobe. Expected pneumobilia within the nondependent gallbladder, which otherwise appears normal with no radiopaque cholelithiasis. Normal caliber common bile duct. Pancreas: Normal, with no mass or duct dilation. Spleen: Stable moderate splenomegaly with craniocaudal splenic length 17.0 cm. No splenic mass. Adrenals/Urinary Tract: Stable bilateral adrenal masses measuring 5.1 cm on the right and 2.5 cm on the left. No hydronephrosis. Nonobstructing 2 mm lower left renal stone. No renal masses. Prominently distended and otherwise normal urinary bladder. Stomach/Bowel: Normal non-distended stomach. Normal caliber small bowel with no small bowel wall thickening. Normal appendix. Stable postsurgical changes from partial left colectomy. Large amount of stool throughout remnant colon and rectum. New mild circumferential rectal wall thickening with slight perirectal fat haziness. Rectal diameter 7.7 cm. No definite rectal pneumatosis or perirectal free air. No additional sites of large bowel wall thickening. Vascular/Lymphatic: Atherosclerotic nonaneurysmal abdominal aorta. Patent main and right portal, splenic, hepatic and renal veins. 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 Stable aortocaval adenopathy up to 1.8 cm (series 2/image 42). Stable enlarged 1.4 cm left para-aortic node (series 2/image 42). Stable enlarged 1.9 cm left mesenteric node (series 2/image 61). No new pathologically enlarged lymph nodes in the abdomen or pelvis. Reproductive: Mildly enlarged prostate. Other: No pneumoperitoneum. No focal  fluid collection. Trace perihepatic ascites, decreased. Musculoskeletal: No aggressive appearing focal osseous lesions. Marked thoracolumbar spondylosis with stable bilateral L5 pars defects. IMPRESSION: 1. Prominently distended and otherwise normal urinary bladder with mild prostatomegaly. Findings suggest acute bladder outlet obstruction. No hydronephrosis. 2. Large colorectal stool volume compatible with constipation. New mild circumferential rectal wall thickening and perirectal fat haziness suggesting stercoral colitis. No free air. No abscess. 3. Stable infiltrative cholangiocarcinoma in the central left liver lobe with associated peripheral intrahepatic biliary ductal dilatation throughout the left liver lobe. 4. Stable well-positioned internal external drain traversing the right liver lobe and decompressing the right liver biliary system. 5. Stable liver, bilateral adrenal, retroperitoneal, left mesenteric and right infrahilar nodal and left lung base metastases. No progressive metastatic disease since 12/03/2017 CT. 6. Cirrhosis. Trace perihepatic ascites, decreased. Stable moderate splenomegaly. 7.  Aortic Atherosclerosis (ICD10-I70.0). Electronically Signed   By: Ilona Sorrel M.D.   On: 12/22/2017 15:01   Dg Abd Acute W/chest  Result Date: 12/22/2017 CLINICAL DATA:  Metastatic cancer with constipation EXAM: DG ABDOMEN ACUTE W/ 1V CHEST COMPARISON:  12/03/2017 FINDINGS: Formed stool distends the colon diffusely. No small bowel dilatation or evident pneumoperitoneum. Percutaneous biliary catheter in unremarkable position. Porta catheter with tip at the upper cavoatrial junction.  There is no edema, consolidation, effusion, or pneumothorax. Normal heart size IMPRESSION: Generalized stool retention consistent with history of constipation. Electronically Signed   By: Monte Fantasia M.D.   On: 12/22/2017 10:00    Labs:  CBC: Recent Labs    12/10/17 0438 12/17/17 1145 12/22/17 1004 12/22/17 1146  12/23/17 0452  WBC 6.0 8.2 6.5  --  5.4  HGB 9.2* 10.0* 9.4* 9.5* 8.5*  HCT 29.4* 30.7* 30.1* 28.0* 27.7*  PLT 222 374 300  --  256    COAGS: Recent Labs    12/03/17 0953 12/09/17 0613 12/22/17 1004 12/23/17 0452  INR 1.51 1.29 1.15 1.18    BMP: Recent Labs    12/10/17 0438 12/17/17 1145 12/22/17 1004 12/22/17 1146 12/23/17 0452  NA 136 136 132* 135 131*  K 4.0 4.3 4.0 4.2 3.9  CL 104 102 98 102 102  CO2 26 27 26   --  23  GLUCOSE 127* 147* 163* 144* 178*  BUN 12 12 19 16 13   CALCIUM 8.5* 9.0 9.0  --  8.5*  CREATININE 0.48* 0.72 0.54* 0.40* 0.52*  GFRNONAA >60 >60 >60  --  >60  GFRAA >60 >60 >60  --  >60    LIVER FUNCTION TESTS: Recent Labs    12/10/17 0438 12/17/17 1145 12/22/17 1004 12/23/17 0452  BILITOT 2.0* 4.5* 4.8* 3.2*  AST 59* 155* 120* 86*  ALT 46* 168* 143* 112*  ALKPHOS 548* 1,076* 525* 419*  PROT 6.9 7.8 8.0 6.9  ALBUMIN 2.3* 2.5* 2.9* 2.5*    Assessment and Plan: Pt with history of stage IV metastatic cholangiocarcinoma with biliary obstruction, status post right hepatic lobe PTC and internal/external biliary drain on 10/26/2017 followed by exchange on 11/07/17 and 12/09/17 followed by capping of drain.   He has been receiving chemotherapy.  He presented to the emergency room 12/16 with decreased urine output ,constipation and some abdominal discomfort.  Due to worsening bilirubin the external drain was uncapped.  Follow-up CT done yesterday revealed prominently distended bladder with mildly enlarged prostate, stable infiltrative cholangiocarcinoma in the central left liver with associated peripheral intrahepatic biliary ductal dilatation throughout the left lobe and stable well-positioned internal/external drain of the right liver, no progressive metastatic disease, cirrhosis, trace ascites, stable moderate splenomegaly.  Currently afebrile, WBC normal, hemoglobin 8.5, 256k, PT/INR normal, creatinine 0.52 ,total bilirubin 3.2 previously 4.8.  Drain  output today 970 cc brown fluid; request now received for follow-up cholangiogram with possible left hepatic biliary drain placement.  Latest imaging studies have been reviewed by Dr. Laurence Ferrari. Details/risks of procedures, including but not limited to, internal bleeding, infection, injury to adjacent structures discussed with patient with his understanding and consent.  We will tentatively plan procedure for later this afternoon.  Electronically Signed: D. Rowe Robert, PA-C 12/23/2017, 10:42 AM   I spent a total of 20 minutes at the the patient's bedside AND on the patient's hospital floor or unit, greater than 50% of which was counseling/coordinating care for biliary drain    Patient ID: [name], male   DOB: 05-21-1955, 62 y.o.   MRN: [id number]

## 2017-12-23 NOTE — Sedation Documentation (Signed)
Pt grimacing

## 2017-12-23 NOTE — Evaluation (Signed)
Occupational Therapy Evaluation Patient Details Name: Matthew Lewis MRN: [id number] DOB: [birth year] Today's Date: 12/23/2017    History of Present Illness 62 y.o. male with medical history significant for intrahepatic cholangiocarcinoma, metastatic to liver and lung, type 2 diabetes, prior history of alcohol abuse, cirrhosis, status post percutaneous biliary drainage drainage tube for biliary obstruction recently d/c from hospital returns with constipation and inability to urinate.   Clinical Impression   Education provided regarding safety with ADL activity.  Pt has a walk in shower with a grab bar and wife will A as needed    Follow Up Recommendations  No OT follow up    Equipment Recommendations  Other (comment)    Recommendations for Other Services       Precautions / Restrictions Precautions Precautions: None Precaution Comments: drain on right with leg bag, foley catheter Restrictions Weight Bearing Restrictions: No      Mobility Bed Mobility Overal bed mobility: Modified Independent                Transfers Overall transfer level: Modified independent   Transfers: Sit to/from Stand Sit to Stand: Modified independent (Device/Increase time)              Balance Overall balance assessment: Needs assistance   Sitting balance-Leahy Scale: Good       Standing balance-Leahy Scale: Fair                             ADL either performed or assessed with clinical judgement   ADL Overall ADL's : Modified independent                                       General ADL Comments: Pt able to perform simple ADL activity with increased time.  Pt did verbalize wife would A as needed     Vision Patient Visual Report: No change from baseline              Pertinent Vitals/Pain Pain Assessment: No/denies pain     Hand Dominance     Extremity/Trunk Assessment     Lower Extremity Assessment RLE Sensation: history of  peripheral neuropathy LLE Sensation: history of peripheral neuropathy   Cervical / Trunk Assessment Cervical / Trunk Assessment: Normal   Communication Communication Communication: No difficulties   Cognition Arousal/Alertness: Awake/alert Behavior During Therapy: WFL for tasks assessed/performed Overall Cognitive Status: Within Functional Limits for tasks assessed                                                Home Living Family/patient expects to be discharged to:: Private residence Living Arrangements: Spouse/significant other Available Help at Discharge: Family Type of Home: House Home Access: Stairs to enter [employer] of Steps: 5 on one entrance, 8 on another bilateral rails Entrance Stairs-Rails: Right;Left Home Layout: Able to live on main level with bedroom/bathroom;Multi-level Alternate Level Stairs-Number of Steps: 3 levels but can stay on main             Home Equipment: Walker - 2 wheels          Prior Functioning/Environment Level of Independence: Independent  OT Goals(Current goals can be found in the care plan section) Acute Rehab OT Goals Patient Stated Goal: to go home  OT Frequency:      AM-PAC OT "6 Clicks" Daily Activity     Outcome Measure Help from another person eating meals?: None Help from another person taking care of personal grooming?: None Help from another person toileting, which includes using toliet, bedpan, or urinal?: None Help from another person bathing (including washing, rinsing, drying)?: None Help from another person to put on and taking off regular upper body clothing?: None Help from another person to put on and taking off regular lower body clothing?: None 6 Click Score: 24   End of Session Nurse Communication: Mobility status  Activity Tolerance: Patient tolerated treatment well;Patient limited by fatigue Patient left: in bed;with call bell/phone within  reach;with nursing/sitter in room                   Time: 1305-1320 OT Time Calculation (min): 15 min Charges:  OT General Charges $OT Visit: 1 Visit OT Evaluation $OT Eval Low Complexity: 1 Low  Kari Baars, OT Acute Rehabilitation Services [phone] Office- [phone]     Merlean Pizzini, Edwena Felty D 12/23/2017, 4:53 PM

## 2017-12-23 NOTE — Procedures (Signed)
Interventional Radiology Procedure Note  Procedure: Left PTC with internal/external 8F PBD placement.  Bile is purulent.   Complications: None  Estimated Blood Loss: None  Recommendations: - Drains to bag - Flush at least once per shift - Continue abx - Trend serum Br  Signed,  Criselda Peaches, MD

## 2017-12-23 NOTE — Sedation Documentation (Signed)
Pt vitals stable. Procedure continues

## 2017-12-23 NOTE — Sedation Documentation (Signed)
Pt is stable at this time, sleeping.

## 2017-12-24 DIAGNOSIS — R7881 Bacteremia: Secondary | ICD-10-CM

## 2017-12-24 LAB — COMPREHENSIVE METABOLIC PANEL
ALT: 80 U/L — AB (ref 0–44)
AST: 65 U/L — ABNORMAL HIGH (ref 15–41)
Albumin: 2.2 g/dL — ABNORMAL LOW (ref 3.5–5.0)
Alkaline Phosphatase: 305 U/L — ABNORMAL HIGH (ref 38–126)
Anion gap: 10 (ref 5–15)
BUN: 23 mg/dL (ref 8–23)
CO2: 18 mmol/L — ABNORMAL LOW (ref 22–32)
Calcium: 7.8 mg/dL — ABNORMAL LOW (ref 8.9–10.3)
Chloride: 99 mmol/L (ref 98–111)
Creatinine, Ser: 1.25 mg/dL — ABNORMAL HIGH (ref 0.61–1.24)
GFR calc non Af Amer: >60 mL/min (ref >60)
Glucose, Bld: 149 mg/dL — ABNORMAL HIGH (ref 70–99)
Potassium: 3.8 mmol/L (ref 3.5–5.1)
Sodium: 127 mmol/L — ABNORMAL LOW (ref 135–145)
Total Bilirubin: 5.6 mg/dL — ABNORMAL HIGH (ref 0.3–1.2)
Total Protein: 6.4 g/dL — ABNORMAL LOW (ref 6.5–8.1)

## 2017-12-24 LAB — URINE CULTURE: Culture: NO GROWTH

## 2017-12-24 LAB — BLOOD CULTURE ID PANEL (REFLEXED)
Acinetobacter baumannii: NOT DETECTED
CANDIDA PARAPSILOSIS: NOT DETECTED
Candida albicans: NOT DETECTED
Candida glabrata: NOT DETECTED
Candida krusei: NOT DETECTED
Candida tropicalis: NOT DETECTED
Carbapenem resistance: NOT DETECTED
Enterobacter cloacae complex: NOT DETECTED
Enterobacteriaceae species: DETECTED — AB
Enterococcus species: NOT DETECTED
Escherichia coli: DETECTED — AB
Haemophilus influenzae: NOT DETECTED
KLEBSIELLA PNEUMONIAE: NOT DETECTED
Klebsiella oxytoca: NOT DETECTED
Listeria monocytogenes: NOT DETECTED
Neisseria meningitidis: NOT DETECTED
Proteus species: NOT DETECTED
Pseudomonas aeruginosa: NOT DETECTED
Serratia marcescens: NOT DETECTED
Staphylococcus aureus (BCID): NOT DETECTED
Staphylococcus species: NOT DETECTED
Streptococcus agalactiae: NOT DETECTED
Streptococcus pneumoniae: NOT DETECTED
Streptococcus pyogenes: NOT DETECTED
Streptococcus species: NOT DETECTED

## 2017-12-24 LAB — CBC WITH DIFFERENTIAL/PLATELET
Abs Immature Granulocytes: 0.04 10*3/uL (ref 0.00–0.07)
BASOS ABS: 0 10*3/uL (ref 0.0–0.1)
Basophils Relative: 0 %
Eosinophils Absolute: 0 10*3/uL (ref 0.0–0.5)
Eosinophils Relative: 0 %
HCT: 24.6 % — ABNORMAL LOW (ref 39.0–52.0)
Hemoglobin: 7.8 g/dL — ABNORMAL LOW (ref 13.0–17.0)
Immature Granulocytes: 1 %
Lymphocytes Relative: 6 %
Lymphs Abs: 0.3 10*3/uL — ABNORMAL LOW (ref 0.7–4.0)
MCH: 29.8 pg (ref 26.0–34.0)
MCHC: 31.7 g/dL (ref 30.0–36.0)
MCV: 93.9 fL (ref 80.0–100.0)
Monocytes Absolute: 0.3 10*3/uL (ref 0.1–1.0)
Monocytes Relative: 5 %
Neutro Abs: 4.6 10*3/uL (ref 1.7–7.7)
Neutrophils Relative %: 88 %
Platelets: 131 10*3/uL — ABNORMAL LOW (ref 150–400)
RBC: 2.62 MIL/uL — ABNORMAL LOW (ref 4.22–5.81)
RDW: 18 % — ABNORMAL HIGH (ref 11.5–15.5)
WBC: 5.2 10*3/uL (ref 4.0–10.5)
nRBC: 0 % (ref 0.0–0.2)

## 2017-12-24 MED ORDER — SODIUM CHLORIDE 0.9 % IV SOLN
1.0000 g | Freq: Three times a day (TID) | INTRAVENOUS | Status: DC
  Administered 2017-12-24 – 2017-12-30 (×19): 1 g via INTRAVENOUS
  Filled 2017-12-24 (×20): qty 1

## 2017-12-24 MED ORDER — BOOST PLUS PO LIQD
237.0000 mL | Freq: Three times a day (TID) | ORAL | Status: DC
  Administered 2017-12-24 – 2017-12-26 (×6): 237 mL via ORAL
  Filled 2017-12-24 (×7): qty 237

## 2017-12-24 NOTE — Progress Notes (Signed)
[facility]  Patient Status: Active (receiving services up to time of hospitalization)  [facility] is providing the following services: RN  If patient discharges after hours, please call [phone].   Matthew Lewis 12/24/2017, 10:45 AM

## 2017-12-24 NOTE — Progress Notes (Signed)
Initial Nutrition Assessment  DOCUMENTATION CODES:   Non-severe (moderate) malnutrition in context of chronic illness  INTERVENTION:   -D/c Ensure per pt request -Provide Boost Plus chocolate TID- Each supplement provides 360kcal and 14g protein.    NUTRITION DIAGNOSIS:   Moderate Malnutrition related to chronic illness, cancer and cancer related treatments as evidenced by moderate muscle depletion, mild fat depletion, percent weight loss.  GOAL:   Patient will meet greater than or equal to 90% of their needs  MONITOR:   PO intake, Supplement acceptance, Labs, Weight trends, I & O's  REASON FOR ASSESSMENT:   Malnutrition Screening Tool    ASSESSMENT:   62 year old gentleman history of intrahepatic cholangiocarcinoma with metastases to the liver and lung on chemotherapy last chemo therapy 4 days prior to admission, prior history of alcohol use with cirrhosis, status post percutaneous biliary drainage tube for biliary obstruction, recent hospitalization December 03, 2017 to December 11, 2017 for septic shock secondary to ESBL E. coli bacteremia status post 2 weeks of IV antibiotics presented to the ED with inability to urinate and constipation.  Patient noted to be dehydrated on admission.  Oncology had recommended IR evaluation for biliary drainage/stent placement to the left due to rising bilirubin levels.  Patient reports eating well with the exception of the day following his recent chemotherapy treatment. Pt is eating 100% of meals. Pt denies any other nutrition impact symptoms. Pt is currently ordered Ensure but pt prefers Boost Plus, will switch supplements.  Per weight records, pt has lost 34 lb since 10/29 (14% wt loss x 1.5 months, significant for time frame). When RD spoke with pt, pt denied any weight changes.  Medications: Multivitamin with minerals daily, Senokot-S tablet BID, IV Zofran PRN Labs reviewed: Low Na   NUTRITION - FOCUSED PHYSICAL EXAM:    Most Recent  Value  Orbital Region  Mild depletion  Upper Arm Region  Moderate depletion  Thoracic and Lumbar Region  Unable to assess  Buccal Region  Mild depletion  Temple Region  Mild depletion  Clavicle Bone Region  Moderate depletion  Clavicle and Acromion Bone Region  Moderate depletion  Scapular Bone Region  Unable to assess  Dorsal Hand  No depletion  Patellar Region  Unable to assess  Anterior Thigh Region  Unable to assess  Posterior Calf Region  Unable to assess  Edema (RD Assessment)  None       Diet Order:   Diet Order            Diet regular Room service appropriate? Yes; Fluid consistency: Thin  Diet effective now              EDUCATION NEEDS:   No education needs have been identified at this time  Skin:  Skin Assessment: Reviewed RN Assessment  Last BM:  12/17  Height:   Ht Readings from Last 1 Encounters:  12/22/17 5\' 11"  (1.803 m)    Weight:   Wt Readings from Last 1 Encounters:  12/22/17 95.3 kg    Ideal Body Weight:  78.18 kg  BMI:  Body mass index is 29.29 kg/m.  Estimated Nutritional Needs:   Kcal:  2400-2600  Protein:  115-125g  Fluid:  2.2L/day   Clayton Bibles, MS, RD, LDN [place] Dietitian Pager: [phone] After Hours Pager: [phone]

## 2017-12-24 NOTE — Progress Notes (Addendum)
Patient ID: Matthew Lewis, male   DOB: 10-24-1955, 62 y.o.   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 coli bacteremia status post 2 weeks of IV antibiotics presented on 12/22/2017 with inability to urinate and constipation.  Patient was dehydrated on admission.  Oncology recommended IR evaluation for increasing bilirubin levels.  Patient underwent IR guided percutaneous biliary drain replacement on 12/23/2017.   Assessment & Plan:   Principal Problem:   Dehydration Active Problems:   Hypertension   Hyperlipemia   Diabetes mellitus ([place])   CAD (coronary artery disease)- PCI 2001- no evaluation since   Moderate protein-calorie malnutrition (HCC)   Alcoholic cirrhosis of liver without ascites (HCC)   Biliary obstruction due to malignant neoplasm (HCC)   Transaminitis   Malignant neoplasm metastatic to adrenal gland (HCC)   Intrahepatic cholangiocarcinoma (HCC)   Cholangiocarcinoma (HCC)   Bladder outlet obstruction   Constipation   E. coli bacteremia  -Patient had temperature spike on 12/23/2017 and blood culture is positive for E. coli.  Patient had recent ESBL E. coli bacteremia which was treated with IV antibiotics -He was given 1 dose of meropenem on 12/23/2017.  Will continue with meropenem.  He might need another 2 weeks course of antibiotics. -No temperature spike since yesterday afternoon  Dehydration  -Treated with IV fluids.  Improving.  Tolerating diet.  Decrease normal saline to 50 cc an hour  Constipation -Improving.   Continue current bowel regimen  Urinary retention status post Foley catheter placement -Patient wants the Foley catheter removed.  Will give voiding trial after removal of Foley catheter.  If goes back into urinary retention, will need to reinsert Foley catheter and will need outpatient urology evaluation.  Intrahepatic cholangiocarcinoma -On palliative chemotherapy.  Oncology following. -Overall prognosis is poor with recurrent bacteremia.  Will get palliative care evaluation  Moderate malnutrition -Follow nutrition recommendations  Chronic anemia from anemia of chronic disease -Hemoglobin stable.  Transfuse if less than 7  History of hypertension -Blood pressure stable.  Currently on the lower side.  Monitor  Diabetes mellitus type 2  -A1c was 4.9 in November 2019.  Continue CBGs with SSI  Alcoholic liver cirrhosis  -Monitor.  No signs of worsening ascites   DVT prophylaxis: Lovenox Code Status: Full Family Communication: Spoke to patient and friends at bedside Disposition Plan: Home in the next few days if clinically stable  Consultants: IR/oncology  Procedures: Percutaneous biliary drain placement by IR on 12/23/2017 Antimicrobials: Meropenem 1 dose on 12/23/2017  Subjective: Patient seen and examined at bedside.  He feels slightly better this morning.  His appetite is better this morning.  He had fever yesterday.  No current nausea.  Complains of some abdominal pain at the site of the procedure.  Objective: Vitals:   12/23/17 1756 12/23/17 1800 12/23/17 2006 12/23/17 2111  BP: 113/69 116/70 (!) 109/54   Pulse: (!) 106 (!) 105 (!) 131 (!) 113  Resp: 19 20 20    Temp:   100.3 F (37.9 C) 98.7 F (37.1 C)  TempSrc:   Oral Oral  SpO2: 100% 95% 97% 96%  Weight:  Height:        Intake/Output Summary (Last 24 hours) at 12/24/2017 1332 Last data filed at 12/24/2017 1139 Gross per 24 hour  Intake 250 ml  Output 1910 ml  Net -1660 ml   Filed Weights    12/22/17 0818  Weight: 95.3 kg    Examination:  General exam: Appears calm and comfortable.  Looks older than stated age Respiratory system: Bilateral decreased breath sounds at bases Cardiovascular system: S1 & S2 heard, tachycardic Gastrointestinal system: Abdomen is nondistended, soft and mildly tender around the drain site. Normal bowel sounds heard. Extremities: No cyanosis, clubbing, edema   Data Reviewed: I have personally reviewed following labs and imaging studies  CBC: Recent Labs  Lab 12/22/17 1004 12/22/17 1146 12/23/17 0452 12/24/17 0636  WBC 6.5  --  5.4 5.2  NEUTROABS 5.0  --   --  4.6  HGB 9.4* 9.5* 8.5* 7.8*  HCT 30.1* 28.0* 27.7* 24.6*  MCV 94.1  --  93.0 93.9  PLT 300  --  256 902*   Basic Metabolic Panel: Recent Labs  Lab 12/22/17 1004 12/22/17 1146 12/23/17 0452 12/24/17 0636  NA 132* 135 131* 127*  K 4.0 4.2 3.9 3.8  CL 98 102 102 99  CO2 26  --  23 18*  GLUCOSE 163* 144* 178* 149*  BUN 19 16 13 23   CREATININE 0.54* 0.40* 0.52* 1.25*  CALCIUM 9.0  --  8.5* 7.8*  MG 1.9  --   --   --    GFR: Estimated Creatinine Clearance: 70.3 mL/min (A) (by C-G formula based on SCr of 1.25 mg/dL (H)). Liver Function Tests: Recent Labs  Lab 12/22/17 1004 12/23/17 0452 12/24/17 0636  AST 120* 86* 65*  ALT 143* 112* 80*  ALKPHOS 525* 419* 305*  BILITOT 4.8* 3.2* 5.6*  PROT 8.0 6.9 6.4*  ALBUMIN 2.9* 2.5* 2.2*   Recent Labs  Lab 12/22/17 1004  LIPASE 26   No results for input(s): AMMONIA in the last 168 hours. Coagulation Profile: Recent Labs  Lab 12/22/17 1004 12/23/17 0452  INR 1.15 1.18   Cardiac Enzymes: No results for input(s): CKTOTAL, CKMB, CKMBINDEX, TROPONINI in the last 168 hours. BNP (last 3 results) No results for input(s): PROBNP in the last 8760 hours. HbA1C: No results for input(s): HGBA1C in the last 72 hours. CBG: Recent Labs  Lab 12/22/17 2209  GLUCAP 176*   Lipid Profile: No results for input(s): CHOL, HDL,  LDLCALC, TRIG, CHOLHDL, LDLDIRECT in the last 72 hours. Thyroid Function Tests: No results for input(s): TSH, T4TOTAL, FREET4, T3FREE, THYROIDAB in the last 72 hours. Anemia Panel: Recent Labs    12/22/17 1733  VITAMINB12 624  FOLATE 25.2  FERRITIN 332  TIBC 287  IRON 91  RETICCTPCT 0.5   Sepsis Labs: No results for input(s): PROCALCITON, LATICACIDVEN in the last 168 hours.  Recent Results (from the past 240 hour(s))  Culture, Urine     Status: None   Collection Time: 12/22/17  5:26 PM  Result Value Ref Range Status   Specimen Description   Final    URINE, RANDOM Performed at [facility] [address], [place], [place] [address]    Special Requests   Final    NONE Performed at [facility], [place] [address] [place], [place] [address]    Culture   Final    NO GROWTH Performed at [facility], [place] [address], [place],  [address]    Report Status  12/24/2017 FINAL  Final  Culture, blood (Routine X 2) w Reflex to ID Panel     Status: None (Preliminary result)   Collection Time: 12/23/17  4:11 PM  Result Value Ref Range Status   Specimen Description   Final    BLOOD LEFT ARM Performed at [facility] [address] [place], [place] [address]    Special Requests   Final    BOTTLES DRAWN AEROBIC AND ANAEROBIC Blood Culture adequate volume Performed at [facility] [address] [place], [place] [address]    Culture  Setup Time   Final    GRAM NEGATIVE RODS AEROBIC BOTTLE ONLY CRITICAL RESULT CALLED TO, READ BACK BY AND VERIFIED WITH: E. WILLIAMSON, PHARMD (WL) AT 0915 ON 12/24/17 BY C. JESSUP, MLT. Performed at [facility], [place] [address] [place], [place] [address]    Culture GRAM NEGATIVE RODS  Final   Report Status PENDING  Incomplete  Culture, blood (Routine X 2) w Reflex to ID Panel     Status: None (Preliminary result)   Collection Time: 12/23/17  4:12 PM  Result  Value Ref Range Status   Specimen Description   Final    BLOOD RIGHT ARM Performed at [facility] [address] [place], [place] [address]    Special Requests   Final    BOTTLES DRAWN AEROBIC AND ANAEROBIC Blood Culture results may not be optimal due to an inadequate volume of blood received in culture bottles Performed at [facility] [address], [place], [place] [address]    Culture  Setup Time   Final    GRAM NEGATIVE RODS AEROBIC BOTTLE ONLY CRITICAL RESULT CALLED TO, READ BACK BY AND VERIFIED WITH: E. WILLIAMSON, PHARMD (WL) AT 0915 ON 12/24/17 BY C. JESSUP, MLT. Performed at [facility], [place] [address], [place], [place] [address]    Culture GRAM NEGATIVE RODS  Final   Report Status PENDING  Incomplete  Blood Culture ID Panel (Reflexed)     Status: Abnormal   Collection Time: 12/23/17  4:12 PM  Result Value Ref Range Status   Enterococcus species NOT DETECTED NOT DETECTED Final   Listeria monocytogenes NOT DETECTED NOT DETECTED Final   Staphylococcus species NOT DETECTED NOT DETECTED Final   Staphylococcus aureus (BCID) NOT DETECTED NOT DETECTED Final   Streptococcus species NOT DETECTED NOT DETECTED Final   Streptococcus agalactiae NOT DETECTED NOT DETECTED Final   Streptococcus pneumoniae NOT DETECTED NOT DETECTED Final   Streptococcus pyogenes NOT DETECTED NOT DETECTED Final   Acinetobacter baumannii NOT DETECTED NOT DETECTED Final   Enterobacteriaceae species DETECTED (A) NOT DETECTED Final    Comment: Enterobacteriaceae represent a large family of gram-negative bacteria, not a single organism. CRITICAL RESULT CALLED TO, READ BACK BY AND VERIFIED WITH: E. WILLIAMSON, PHARMD (WL) AT 0915 ON 12/24/17 BY C. JESSUP, MLT.    Enterobacter cloacae complex NOT DETECTED NOT DETECTED Final   Escherichia coli DETECTED (A) NOT DETECTED Final    Comment: CRITICAL RESULT CALLED TO, READ BACK BY AND VERIFIED WITH: E. WILLIAMSON,  PHARMD (WL) AT 0915 ON 12/24/17 BY C. JESSUP, MLT.    Klebsiella oxytoca NOT DETECTED NOT DETECTED Final   Klebsiella pneumoniae NOT DETECTED NOT DETECTED Final   Proteus species NOT DETECTED NOT DETECTED Final   Serratia marcescens NOT DETECTED NOT DETECTED Final   Carbapenem resistance NOT DETECTED NOT DETECTED Final   Haemophilus influenzae NOT DETECTED NOT DETECTED Final  Neisseria meningitidis NOT DETECTED NOT DETECTED Final   Pseudomonas aeruginosa NOT DETECTED NOT DETECTED Final   Candida albicans NOT DETECTED NOT DETECTED Final   Candida glabrata NOT DETECTED NOT DETECTED Final   Candida krusei NOT DETECTED NOT DETECTED Final   Candida parapsilosis NOT DETECTED NOT DETECTED Final   Candida tropicalis NOT DETECTED NOT DETECTED Final    Comment: Performed at [facility], [place] [address], [place], [place] [address]  Body fluid culture     Status: None (Preliminary result)   Collection Time: 12/23/17 11:52 PM  Result Value Ref Range Status   Specimen Description   Final    BILE Performed at [facility] [address], [place], [place] [address]    Special Requests   Final    Normal Performed at [facility], [place] [address], [place], [place] [address]    Gram Stain   Final    MODERATE WBC PRESENT, PREDOMINANTLY PMN RARE GRAM POSITIVE COCCI RARE GRAM NEGATIVE RODS RARE YEAST Performed at [facility], [place] [address] [place], [place] [address]    Culture PENDING  Incomplete   Report Status PENDING  Incomplete         Radiology Studies: Ct Abdomen Pelvis W Contrast  Result Date: 12/22/2017 CLINICAL DATA:  Intrahepatic cholangiocarcinoma, stage IV, with adrenal and retroperitoneal nodal metastases, diagnosed October 2019. Status post internal external percutaneous biliary drainage. Abdominal pain. Unable to micturate. Constipation. EXAM: CT ABDOMEN AND PELVIS WITH CONTRAST TECHNIQUE: Multidetector CT imaging of the  abdomen and pelvis was performed using the standard protocol following bolus administration of intravenous contrast. CONTRAST:  135m ISOVUE-300 IOPAMIDOL (ISOVUE-300) INJECTION 61% COMPARISON:  12/03/2017 CT abdomen/pelvis. FINDINGS: Lower chest: Solid irregular 3.0 cm left lower lobe lung mass (series 6/image 38), stable. Bandlike consolidation in the basilar right lower lobe base slightly decreased, favoring evolving postinfectious/postinflammatory scarring. Coronary atherosclerosis. Tip of right sided Port-A-Cath is seen at the cavoatrial junction. Enlarged 1.8 cm right infrahilar node is stable since 10/27/2017 chest CT. Hepatobiliary: Liver surface is diffusely irregular, compatible with hepatic cirrhosis. Ill-defined infiltrative 5.8 x 4.8 cm hypoenhancing central left liver lobe mass (series 2/image 19), previously 5.5 x 5.0 cm on 12/03/2017 CT using similar measurement technique, not appreciably changed. Stable moderate intrahepatic biliary ductal dilatation throughout the left lower lobe peripheral to this mass. Multiple (at least 5) liver masses scattered throughout the periphery of the liver have not appreciably changed, largest 3.6 cm in the anterior superior liver (series 2/image 13) and 3.5 cm in the far inferior right liver lobe (series 2/image 32). Stable position of percutaneous internal external biliary drain traversing the right liver lobe and terminating within the descending duodenal lumen, with no significant intrahepatic biliary ductal dilatation in the right liver lobe. Expected pneumobilia within the nondependent gallbladder, which otherwise appears normal with no radiopaque cholelithiasis. Normal caliber common bile duct. Pancreas: Normal, with no mass or duct dilation. Spleen: Stable moderate splenomegaly with craniocaudal splenic length 17.0 cm. No splenic mass. Adrenals/Urinary Tract: Stable bilateral adrenal masses measuring 5.1 cm on the right and 2.5 cm on the left. No  hydronephrosis. Nonobstructing 2 mm lower left renal stone. No renal masses. Prominently distended and otherwise normal urinary bladder. Stomach/Bowel: Normal non-distended stomach. Normal caliber small bowel with no small bowel wall thickening. Normal appendix. Stable postsurgical changes from partial left colectomy. Large amount of stool throughout remnant colon and rectum. New mild circumferential rectal wall thickening with slight perirectal fat haziness.  Rectal diameter 7.7 cm. No definite rectal pneumatosis or perirectal free air. No additional sites of large bowel wall thickening. Vascular/Lymphatic: Atherosclerotic nonaneurysmal abdominal aorta. Patent main and right portal, splenic, hepatic and renal veins. 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 Stable aortocaval adenopathy up to 1.8 cm (series 2/image 42). Stable enlarged 1.4 cm left para-aortic node (series 2/image 42). Stable enlarged 1.9 cm left mesenteric node (series 2/image 61). No new pathologically enlarged lymph nodes in the abdomen or pelvis. Reproductive: Mildly enlarged prostate. Other: No pneumoperitoneum. No focal fluid collection. Trace perihepatic ascites, decreased. Musculoskeletal: No aggressive appearing focal osseous lesions. Marked thoracolumbar spondylosis with stable bilateral L5 pars defects. IMPRESSION: 1. Prominently distended and otherwise normal urinary bladder with mild prostatomegaly. Findings suggest acute bladder outlet obstruction. No hydronephrosis. 2. Large colorectal stool volume compatible with constipation. New mild circumferential rectal wall thickening and perirectal fat haziness suggesting stercoral colitis. No free air. No abscess. 3. Stable infiltrative cholangiocarcinoma in the central left liver lobe with associated peripheral intrahepatic biliary ductal dilatation throughout the left liver lobe. 4. Stable well-positioned internal external drain traversing the right liver lobe and decompressing the right  liver biliary system. 5. Stable liver, bilateral adrenal, retroperitoneal, left mesenteric and right infrahilar nodal and left lung base metastases. No progressive metastatic disease since 12/03/2017 CT. 6. Cirrhosis. Trace perihepatic ascites, decreased. Stable moderate splenomegaly. 7.  Aortic Atherosclerosis (ICD10-I70.0). Electronically Signed   By: Ilona Sorrel M.D.   On: 12/22/2017 15:01   Ir Cholangiogram Existing Tube  Result Date: 12/23/2017 INDICATION: 62 year old male with metastatic cholangiocarcinoma, jaundice, elevated bilirubin and new onset high-grade fever concerning for acute cholangitis. He has a previously placed percutaneous biliary drain via the right system. However, on CT imaging he has persistent obstruction of the left biliary system secondary to a central hepatic mass. The left system is currently undrained. Additionally, the right anterior system is poorly drained. Therefore, he presents for placement of a left-sided biliary drain and also a drain injection of the existing right-sided drainage catheter to make sure it is not clogged or malpositioned. EXAM: 1. Percutaneous transhepatic cholangiogram 2. Placement of an internal/external percutaneous biliary drain via the left 3. Cholangiogram through existing catheter via the existing right percutaneous biliary drain MEDICATIONS: 1 g Meropenem; The antibiotic was administered within an appropriate time frame prior to the initiation of the procedure. ANESTHESIA/SEDATION: Moderate (conscious) sedation was employed during this procedure. A total of Versed 4 mg and Fentanyl 25 mcg was administered intravenously. Moderate Sedation Time: 26 minutes. The patient's level of consciousness and vital signs were monitored continuously by radiology nursing throughout the procedure under my direct supervision. FLUOROSCOPY TIME:  Fluoroscopy Time: 6 minutes 0 seconds (216 mGy). COMPLICATIONS: None immediate. PROCEDURE: Informed written consent was  obtained from the patient after a thorough discussion of the procedural risks, benefits and alternatives. All questions were addressed. Maximal Sterile Barrier Technique was utilized including caps, mask, sterile gowns, sterile gloves, sterile drape, hand hygiene and skin antiseptic. A timeout was performed prior to the initiation of the procedure. The mid epigastric region was interrogated with ultrasound. There is biliary ductal dilatation. A suitable peripheral biliary radicle was identified. Local anesthesia was attained by infiltration with 1% lidocaine. A small dermatotomy was made. Under real-time sonographic guidance, the bile duct was punctured using a 22 gauge Chiba needle. There was return of purulent appearing bile. A percutaneous transhepatic cholangiogram was performed. There is diffuse dilatation of the left intrahepatic ducts with complete occlusion of the left main  hepatic duct. A Nitrex wire was carefully advanced into the left ductal system. The Chiba needle was removed and the 4 [place] hydrophilic transitional sheath advanced over the wire and positioned in the central left hepatic ducts. A glidewire was then successfully navigated through the occlusion and into the common bile duct. The sheath was advanced of the common bile duct. The glidewire was exchanged for an Amplatz wire which was advanced into the duodenum. Given the relatively long common bile duct and the peripheral access site in the left ductal system, a 10 [place] biliary drain was modified with additional sideholes. The percutaneous tract was then dilated to 10 [place]. The modified 10 [place] biliary drain was then advanced over the wire and formed in the duodenum. The most proximal side hole is at the site of the biliary duct entry. This is visible on the images and marked by the tip of a Bentson wire. The catheter was connected to gravity bag drainage and secured to the skin with 0 Prolene suture. Attention was turned to the  existing right percutaneous biliary drain. Contrast was injected under fluoroscopy. The drain is patent and functioning normally. No further intervention performed on this side. IMPRESSION: 1. Successful placement of a new left-sided internal/external percutaneous biliary drain. The drain is 10 [place] and modified with additional sideholes given the length of the occluded segment. 2. Cholangiogram through existing tube on the right demonstrates a normally functioning right-sided percutaneous biliary drainage catheter. Electronically Signed   By: Jacqulynn Cadet M.D.   On: 12/23/2017 18:18   Ir Int Lianne Cure Biliary Drain With Cholangiogram  Result Date: 12/23/2017 INDICATION: 62 year old male with metastatic cholangiocarcinoma, jaundice, elevated bilirubin and new onset high-grade fever concerning for acute cholangitis. He has a previously placed percutaneous biliary drain via the right system. However, on CT imaging he has persistent obstruction of the left biliary system secondary to a central hepatic mass. The left system is currently undrained. Additionally, the right anterior system is poorly drained. Therefore, he presents for placement of a left-sided biliary drain and also a drain injection of the existing right-sided drainage catheter to make sure it is not clogged or malpositioned. EXAM: 1. Percutaneous transhepatic cholangiogram 2. Placement of an internal/external percutaneous biliary drain via the left 3. Cholangiogram through existing catheter via the existing right percutaneous biliary drain MEDICATIONS: 1 g Meropenem; The antibiotic was administered within an appropriate time frame prior to the initiation of the procedure. ANESTHESIA/SEDATION: Moderate (conscious) sedation was employed during this procedure. A total of Versed 4 mg and Fentanyl 25 mcg was administered intravenously. Moderate Sedation Time: 26 minutes. The patient's level of consciousness and vital signs were monitored continuously by  radiology nursing throughout the procedure under my direct supervision. FLUOROSCOPY TIME:  Fluoroscopy Time: 6 minutes 0 seconds (216 mGy). COMPLICATIONS: None immediate. PROCEDURE: Informed written consent was obtained from the patient after a thorough discussion of the procedural risks, benefits and alternatives. All questions were addressed. Maximal Sterile Barrier Technique was utilized including caps, mask, sterile gowns, sterile gloves, sterile drape, hand hygiene and skin antiseptic. A timeout was performed prior to the initiation of the procedure. The mid epigastric region was interrogated with ultrasound. There is biliary ductal dilatation. A suitable peripheral biliary radicle was identified. Local anesthesia was attained by infiltration with 1% lidocaine. A small dermatotomy was made. Under real-time sonographic guidance, the bile duct was punctured using a 22 gauge Chiba needle. There was return of purulent appearing bile. A percutaneous transhepatic cholangiogram was performed. There is  diffuse dilatation of the left intrahepatic ducts with complete occlusion of the left main hepatic duct. A Nitrex wire was carefully advanced into the left ductal system. The Chiba needle was removed and the 4 [place] hydrophilic transitional sheath advanced over the wire and positioned in the central left hepatic ducts. A glidewire was then successfully navigated through the occlusion and into the common bile duct. The sheath was advanced of the common bile duct. The glidewire was exchanged for an Amplatz wire which was advanced into the duodenum. Given the relatively long common bile duct and the peripheral access site in the left ductal system, a 10 [place] biliary drain was modified with additional sideholes. The percutaneous tract was then dilated to 10 [place]. The modified 10 [place] biliary drain was then advanced over the wire and formed in the duodenum. The most proximal side hole is at the site of the biliary duct  entry. This is visible on the images and marked by the tip of a Bentson wire. The catheter was connected to gravity bag drainage and secured to the skin with 0 Prolene suture. Attention was turned to the existing right percutaneous biliary drain. Contrast was injected under fluoroscopy. The drain is patent and functioning normally. No further intervention performed on this side. IMPRESSION: 1. Successful placement of a new left-sided internal/external percutaneous biliary drain. The drain is 10 [place] and modified with additional sideholes given the length of the occluded segment. 2. Cholangiogram through existing tube on the right demonstrates a normally functioning right-sided percutaneous biliary drainage catheter. Electronically Signed   By: Jacqulynn Cadet M.D.   On: 12/23/2017 18:18        Scheduled Meds: . aspirin EC  81 mg Oral Daily  . celecoxib  200 mg Oral QHS  . enoxaparin (LOVENOX) injection  40 mg Subcutaneous Q24H  . gabapentin  400 mg Oral Daily   And  . gabapentin  800 mg Oral QHS  . lactose free nutrition  237 mL Oral TID WC  . loratadine  10 mg Oral Daily  . multivitamin with minerals  1 tablet Oral Daily  . polyethylene glycol  17 g Oral BID  . senna-docusate  1 tablet Oral BID  . sodium chloride flush  3 mL Intravenous Q12H  . sodium chloride flush  5 mL Intracatheter Q8H   Continuous Infusions: . sodium chloride 75 mL/hr at 12/23/17 1032  . meropenem (MERREM) IV 1 g (12/24/17 1113)     LOS: 1 day        Aline August, MD [facility] Pager [phone]  If 7PM-7AM, please contact night-coverage [email or web address] Password [email or web address] 12/24/2017, 1:32 PM

## 2017-12-24 NOTE — Progress Notes (Signed)
PHARMACY - PHYSICIAN COMMUNICATION CRITICAL VALUE ALERT - BLOOD CULTURE IDENTIFICATION (BCID)  Matthew Lewis is an 62 y.o. male who presented to [facility] on 12/22/2017 with a chief complaint of urinary difficulty  Assessment:  Likely biliary source   Name of physician (or Provider) Contacted: Dr. Starla Link   Current antibiotics: none  Changes to prescribed antibiotics recommended:  Recent hx of ESBL bacteremia, start on meropenem   - Start meropenem 1 gr IV q8h   Results for orders placed or performed during the hospital encounter of 12/22/17  Blood Culture ID Panel (Reflexed) (Collected: 12/23/2017  4:12 PM)  Result Value Ref Range   Enterococcus species NOT DETECTED NOT DETECTED   Listeria monocytogenes NOT DETECTED NOT DETECTED   Staphylococcus species NOT DETECTED NOT DETECTED   Staphylococcus aureus (BCID) NOT DETECTED NOT DETECTED   Streptococcus species NOT DETECTED NOT DETECTED   Streptococcus agalactiae NOT DETECTED NOT DETECTED   Streptococcus pneumoniae NOT DETECTED NOT DETECTED   Streptococcus pyogenes NOT DETECTED NOT DETECTED   Acinetobacter baumannii NOT DETECTED NOT DETECTED   Enterobacteriaceae species DETECTED (A) NOT DETECTED   Enterobacter cloacae complex NOT DETECTED NOT DETECTED   Escherichia coli DETECTED (A) NOT DETECTED   Klebsiella oxytoca NOT DETECTED NOT DETECTED   Klebsiella pneumoniae NOT DETECTED NOT DETECTED   Proteus species NOT DETECTED NOT DETECTED   Serratia marcescens NOT DETECTED NOT DETECTED   Carbapenem resistance NOT DETECTED NOT DETECTED   Haemophilus influenzae NOT DETECTED NOT DETECTED   Neisseria meningitidis NOT DETECTED NOT DETECTED   Pseudomonas aeruginosa NOT DETECTED NOT DETECTED   Candida albicans NOT DETECTED NOT DETECTED   Candida glabrata NOT DETECTED NOT DETECTED   Candida krusei NOT DETECTED NOT DETECTED   Candida parapsilosis NOT DETECTED NOT DETECTED   Candida tropicalis NOT DETECTED NOT DETECTED    Royetta Asal, PharmD, BCPS Pager [phone] 12/24/2017 9:40 AM

## 2017-12-24 NOTE — Progress Notes (Signed)
Matthew Lewis   DOB:1955-04-26   TZ#:[id number]   QPR#:[id number]  Oncology f/u   Subjective: Patient underwent left side external and internal biliary drain placement yesterday, he tolerated procedure well, both side PTC draining well.  He has mild abdominal soreness from the procedure, still feels weak, no other complaints.  His Foley was taken out, is able to urinate by himself, urine flow is slow.   Objective:  Vitals:   12/23/17 2111 12/24/17 1339  BP:  98/60  Pulse: (!) 113 (!) 107  Resp:  16  Temp: 98.7 F (37.1 C) 98.9 F (37.2 C)  SpO2: 96% 97%    Body mass index is 29.29 kg/m.  Intake/Output Summary (Last 24 hours) at 12/24/2017 1744 Last data filed at 12/24/2017 1636 Gross per 24 hour  Intake 490 ml  Output 2360 ml  Net -1870 ml     Sclerae (+) jaundice   Oropharynx clear  No peripheral adenopathy  Lungs clear -- no rales or rhonchi  Heart regular rate and rhythm  Abdomen soft, (+) biliary drain tube at right side   MSK no focal spinal tenderness, no peripheral edema  Neuro nonfocal   CBG (last 3)  Recent Labs    12/22/17 2209  GLUCAP 176*     Labs:  Lab Results  Component Value Date   WBC 5.2 12/24/2017   HGB 7.8 (L) 12/24/2017   HCT 24.6 (L) 12/24/2017   MCV 93.9 12/24/2017   PLT 131 (L) 12/24/2017   NEUTROABS 4.6 12/24/2017   CMP Latest Ref Rng & Units 12/24/2017 12/23/2017 12/22/2017  Glucose 70 - 99 mg/dL 149(H) 178(H) 144(H)  BUN 8 - 23 mg/dL 23 13 16   Creatinine 0.61 - 1.24 mg/dL 1.25(H) 0.52(L) 0.40(L)  Sodium 135 - 145 mmol/L 127(L) 131(L) 135  Potassium 3.5 - 5.1 mmol/L 3.8 3.9 4.2  Chloride 98 - 111 mmol/L 99 102 102  CO2 22 - 32 mmol/L 18(L) 23 -  Calcium 8.9 - 10.3 mg/dL 7.8(L) 8.5(L) -  Total Protein 6.5 - 8.1 g/dL 6.4(L) 6.9 -  Total Bilirubin 0.3 - 1.2 mg/dL 5.6(H) 3.2(H) -  Alkaline Phos 38 - 126 U/L 305(H) 419(H) -  AST 15 - 41 U/L 65(H) 86(H) -  ALT 0 - 44 U/L 80(H) 112(H) -     Urine Studies No results for  input(s): UHGB, CRYS in the last 72 hours.  Invalid input(s): UACOL, UAPR, USPG, UPH, UTP, UGL, UKET, UBIL, UNIT, UROB, [place], UEPI, UWBC, Duwayne Heck [place], [place]  Basic Metabolic Panel: Recent Labs  Lab 12/22/17 1004 12/22/17 1146 12/23/17 0452 12/24/17 0636  NA 132* 135 131* 127*  K 4.0 4.2 3.9 3.8  CL 98 102 102 99  CO2 26  --  23 18*  GLUCOSE 163* 144* 178* 149*  BUN 19 16 13 23   CREATININE 0.54* 0.40* 0.52* 1.25*  CALCIUM 9.0  --  8.5* 7.8*  MG 1.9  --   --   --    GFR Estimated Creatinine Clearance: 70.3 mL/min (A) (by C-G formula based on SCr of 1.25 mg/dL (H)). Liver Function Tests: Recent Labs  Lab 12/22/17 1004 12/23/17 0452 12/24/17 0636  AST 120* 86* 65*  ALT 143* 112* 80*  ALKPHOS 525* 419* 305*  BILITOT 4.8* 3.2* 5.6*  PROT 8.0 6.9 6.4*  ALBUMIN 2.9* 2.5* 2.2*   Recent Labs  Lab 12/22/17 1004  LIPASE 26   No results for input(s): AMMONIA in the last 168 hours. Coagulation profile Recent Labs  Lab 12/22/17 1004 12/23/17 0452  INR 1.15 1.18    CBC: Recent Labs  Lab 12/22/17 1004 12/22/17 1146 12/23/17 0452 12/24/17 0636  WBC 6.5  --  5.4 5.2  NEUTROABS 5.0  --   --  4.6  HGB 9.4* 9.5* 8.5* 7.8*  HCT 30.1* 28.0* 27.7* 24.6*  MCV 94.1  --  93.0 93.9  PLT 300  --  256 131*   Cardiac Enzymes: No results for input(s): CKTOTAL, CKMB, CKMBINDEX, TROPONINI in the last 168 hours. BNP: Invalid input(s): POCBNP CBG: Recent Labs  Lab 12/22/17 2209  GLUCAP 176*   D-Dimer No results for input(s): DDIMER in the last 72 hours. Hgb A1c No results for input(s): HGBA1C in the last 72 hours. Lipid Profile No results for input(s): CHOL, HDL, LDLCALC, TRIG, CHOLHDL, LDLDIRECT in the last 72 hours. Thyroid function studies No results for input(s): TSH, T4TOTAL, T3FREE, THYROIDAB in the last 72 hours.  Invalid input(s): FREET3 Anemia work up Recent Labs    12/22/17 1733  VITAMINB12 624  FOLATE 25.2  FERRITIN 332  TIBC 287  IRON 91   RETICCTPCT 0.5   Microbiology Recent Results (from the past 240 hour(s))  Culture, Urine     Status: None   Collection Time: 12/22/17  5:26 PM  Result Value Ref Range Status   Specimen Description   Final    URINE, RANDOM Performed at [facility], [place] [address], [place], [place] [address]    Special Requests   Final    NONE Performed at [facility], [place] [address], [place], [place] [address]    Culture   Final    NO GROWTH Performed at [facility], [place] [address], [place], [place] [address]    Report Status 12/24/2017 FINAL  Final  Culture, blood (Routine X 2) w Reflex to ID Panel     Status: None (Preliminary result)   Collection Time: 12/23/17  4:11 PM  Result Value Ref Range Status   Specimen Description   Final    BLOOD LEFT ARM Performed at [facility] [address], [place], [place] [address]    Special Requests   Final    BOTTLES DRAWN AEROBIC AND ANAEROBIC Blood Culture adequate volume Performed at [facility] [address] [place], [place] [address]    Culture  Setup Time   Final    GRAM NEGATIVE RODS AEROBIC BOTTLE ONLY CRITICAL RESULT CALLED TO, READ BACK BY AND VERIFIED WITH: E. WILLIAMSON, PHARMD (WL) AT 0915 ON 12/24/17 BY C. JESSUP, MLT. Performed at [facility], [place] [address], [place], [place] [address]    Culture GRAM NEGATIVE RODS  Final   Report Status PENDING  Incomplete  Culture, blood (Routine X 2) w Reflex to ID Panel     Status: None (Preliminary result)   Collection Time: 12/23/17  4:12 PM  Result Value Ref Range Status   Specimen Description   Final    BLOOD RIGHT ARM Performed at [facility] [address], [place], [place] [address]    Special Requests   Final    BOTTLES DRAWN AEROBIC AND ANAEROBIC Blood Culture results may not be optimal due to an inadequate volume of blood received in culture bottles Performed at  [facility] [address] [place], [place] [address]    Culture  Setup Time   Final    GRAM NEGATIVE RODS AEROBIC BOTTLE ONLY CRITICAL RESULT  CALLED TO, READ BACK BY AND VERIFIED WITH: E. WILLIAMSON, PHARMD (WL) AT 0915 ON 12/24/17 BY C. JESSUP, MLT. Performed at [facility], [place] [address] [place], [place] [address]    Culture GRAM NEGATIVE RODS  Final   Report Status PENDING  Incomplete  Blood Culture ID Panel (Reflexed)     Status: Abnormal   Collection Time: 12/23/17  4:12 PM  Result Value Ref Range Status   Enterococcus species NOT DETECTED NOT DETECTED Final   Listeria monocytogenes NOT DETECTED NOT DETECTED Final   Staphylococcus species NOT DETECTED NOT DETECTED Final   Staphylococcus aureus (BCID) NOT DETECTED NOT DETECTED Final   Streptococcus species NOT DETECTED NOT DETECTED Final   Streptococcus agalactiae NOT DETECTED NOT DETECTED Final   Streptococcus pneumoniae NOT DETECTED NOT DETECTED Final   Streptococcus pyogenes NOT DETECTED NOT DETECTED Final   Acinetobacter baumannii NOT DETECTED NOT DETECTED Final   Enterobacteriaceae species DETECTED (A) NOT DETECTED Final    Comment: Enterobacteriaceae represent a large family of gram-negative bacteria, not a single organism. CRITICAL RESULT CALLED TO, READ BACK BY AND VERIFIED WITH: E. WILLIAMSON, PHARMD (WL) AT 0915 ON 12/24/17 BY C. JESSUP, MLT.    Enterobacter cloacae complex NOT DETECTED NOT DETECTED Final   Escherichia coli DETECTED (A) NOT DETECTED Final    Comment: CRITICAL RESULT CALLED TO, READ BACK BY AND VERIFIED WITH: E. WILLIAMSON, PHARMD (WL) AT 0915 ON 12/24/17 BY C. JESSUP, MLT.    Klebsiella oxytoca NOT DETECTED NOT DETECTED Final   Klebsiella pneumoniae NOT DETECTED NOT DETECTED Final   Proteus species NOT DETECTED NOT DETECTED Final   Serratia marcescens NOT DETECTED NOT DETECTED Final   Carbapenem resistance NOT DETECTED NOT DETECTED Final   Haemophilus influenzae NOT  DETECTED NOT DETECTED Final   Neisseria meningitidis NOT DETECTED NOT DETECTED Final   Pseudomonas aeruginosa NOT DETECTED NOT DETECTED Final   Candida albicans NOT DETECTED NOT DETECTED Final   Candida glabrata NOT DETECTED NOT DETECTED Final   Candida krusei NOT DETECTED NOT DETECTED Final   Candida parapsilosis NOT DETECTED NOT DETECTED Final   Candida tropicalis NOT DETECTED NOT DETECTED Final    Comment: Performed at [facility], [place] [address] [place], [place] [address]  Body fluid culture     Status: None (Preliminary result)   Collection Time: 12/23/17 11:52 PM  Result Value Ref Range Status   Specimen Description   Final    BILE Performed at [facility] [address], [place], [place] [address]    Special Requests   Final    Normal Performed at [facility], [place] [address], [place], [place] [address]    Gram Stain   Final    MODERATE WBC PRESENT, PREDOMINANTLY PMN RARE GRAM POSITIVE COCCI RARE GRAM NEGATIVE RODS RARE YEAST Performed at [facility], [place] [address] [place], [place] [address]    Culture PENDING  Incomplete   Report Status PENDING  Incomplete      Studies:  Ir Cholangiogram Existing Tube  Result Date: 12/23/2017 INDICATION: 62 year old male with metastatic cholangiocarcinoma, jaundice, elevated bilirubin and new onset high-grade fever concerning for acute cholangitis. He has a previously placed percutaneous biliary drain via the right system. However, on CT imaging he has persistent obstruction of the left biliary system secondary to a central hepatic mass. The left system is currently undrained. Additionally, the right anterior system is poorly drained. Therefore, he presents for placement of a left-sided biliary  drain and also a drain injection of the existing right-sided drainage catheter to make sure it is not clogged or malpositioned. EXAM: 1. Percutaneous transhepatic cholangiogram 2. Placement  of an internal/external percutaneous biliary drain via the left 3. Cholangiogram through existing catheter via the existing right percutaneous biliary drain MEDICATIONS: 1 g Meropenem; The antibiotic was administered within an appropriate time frame prior to the initiation of the procedure. ANESTHESIA/SEDATION: Moderate (conscious) sedation was employed during this procedure. A total of Versed 4 mg and Fentanyl 25 mcg was administered intravenously. Moderate Sedation Time: 26 minutes. The patient's level of consciousness and vital signs were monitored continuously by radiology nursing throughout the procedure under my direct supervision. FLUOROSCOPY TIME:  Fluoroscopy Time: 6 minutes 0 seconds (216 mGy). COMPLICATIONS: None immediate. PROCEDURE: Informed written consent was obtained from the patient after a thorough discussion of the procedural risks, benefits and alternatives. All questions were addressed. Maximal Sterile Barrier Technique was utilized including caps, mask, sterile gowns, sterile gloves, sterile drape, hand hygiene and skin antiseptic. A timeout was performed prior to the initiation of the procedure. The mid epigastric region was interrogated with ultrasound. There is biliary ductal dilatation. A suitable peripheral biliary radicle was identified. Local anesthesia was attained by infiltration with 1% lidocaine. A small dermatotomy was made. Under real-time sonographic guidance, the bile duct was punctured using a 22 gauge Chiba needle. There was return of purulent appearing bile. A percutaneous transhepatic cholangiogram was performed. There is diffuse dilatation of the left intrahepatic ducts with complete occlusion of the left main hepatic duct. A Nitrex wire was carefully advanced into the left ductal system. The Chiba needle was removed and the 4 [place] hydrophilic transitional sheath advanced over the wire and positioned in the central left hepatic ducts. A glidewire was then successfully  navigated through the occlusion and into the common bile duct. The sheath was advanced of the common bile duct. The glidewire was exchanged for an Amplatz wire which was advanced into the duodenum. Given the relatively long common bile duct and the peripheral access site in the left ductal system, a 10 [place] biliary drain was modified with additional sideholes. The percutaneous tract was then dilated to 10 [place]. The modified 10 [place] biliary drain was then advanced over the wire and formed in the duodenum. The most proximal side hole is at the site of the biliary duct entry. This is visible on the images and marked by the tip of a Bentson wire. The catheter was connected to gravity bag drainage and secured to the skin with 0 Prolene suture. Attention was turned to the existing right percutaneous biliary drain. Contrast was injected under fluoroscopy. The drain is patent and functioning normally. No further intervention performed on this side. IMPRESSION: 1. Successful placement of a new left-sided internal/external percutaneous biliary drain. The drain is 10 [place] and modified with additional sideholes given the length of the occluded segment. 2. Cholangiogram through existing tube on the right demonstrates a normally functioning right-sided percutaneous biliary drainage catheter. Electronically Signed   By: Jacqulynn Cadet M.D.   On: 12/23/2017 18:18   Ir Int Lianne Cure Biliary Drain With Cholangiogram  Result Date: 12/23/2017 INDICATION: 62 year old male with metastatic cholangiocarcinoma, jaundice, elevated bilirubin and new onset high-grade fever concerning for acute cholangitis. He has a previously placed percutaneous biliary drain via the right system. However, on CT imaging he has persistent obstruction of the left biliary system secondary to a central hepatic mass. The left system is currently undrained. Additionally, the right  anterior system is poorly drained. Therefore, he presents for placement of a  left-sided biliary drain and also a drain injection of the existing right-sided drainage catheter to make sure it is not clogged or malpositioned. EXAM: 1. Percutaneous transhepatic cholangiogram 2. Placement of an internal/external percutaneous biliary drain via the left 3. Cholangiogram through existing catheter via the existing right percutaneous biliary drain MEDICATIONS: 1 g Meropenem; The antibiotic was administered within an appropriate time frame prior to the initiation of the procedure. ANESTHESIA/SEDATION: Moderate (conscious) sedation was employed during this procedure. A total of Versed 4 mg and Fentanyl 25 mcg was administered intravenously. Moderate Sedation Time: 26 minutes. The patient's level of consciousness and vital signs were monitored continuously by radiology nursing throughout the procedure under my direct supervision. FLUOROSCOPY TIME:  Fluoroscopy Time: 6 minutes 0 seconds (216 mGy). COMPLICATIONS: None immediate. PROCEDURE: Informed written consent was obtained from the patient after a thorough discussion of the procedural risks, benefits and alternatives. All questions were addressed. Maximal Sterile Barrier Technique was utilized including caps, mask, sterile gowns, sterile gloves, sterile drape, hand hygiene and skin antiseptic. A timeout was performed prior to the initiation of the procedure. The mid epigastric region was interrogated with ultrasound. There is biliary ductal dilatation. A suitable peripheral biliary radicle was identified. Local anesthesia was attained by infiltration with 1% lidocaine. A small dermatotomy was made. Under real-time sonographic guidance, the bile duct was punctured using a 22 gauge Chiba needle. There was return of purulent appearing bile. A percutaneous transhepatic cholangiogram was performed. There is diffuse dilatation of the left intrahepatic ducts with complete occlusion of the left main hepatic duct. A Nitrex wire was carefully advanced into the  left ductal system. The Chiba needle was removed and the 4 [place] hydrophilic transitional sheath advanced over the wire and positioned in the central left hepatic ducts. A glidewire was then successfully navigated through the occlusion and into the common bile duct. The sheath was advanced of the common bile duct. The glidewire was exchanged for an Amplatz wire which was advanced into the duodenum. Given the relatively long common bile duct and the peripheral access site in the left ductal system, a 10 [place] biliary drain was modified with additional sideholes. The percutaneous tract was then dilated to 10 [place]. The modified 10 [place] biliary drain was then advanced over the wire and formed in the duodenum. The most proximal side hole is at the site of the biliary duct entry. This is visible on the images and marked by the tip of a Bentson wire. The catheter was connected to gravity bag drainage and secured to the skin with 0 Prolene suture. Attention was turned to the existing right percutaneous biliary drain. Contrast was injected under fluoroscopy. The drain is patent and functioning normally. No further intervention performed on this side. IMPRESSION: 1. Successful placement of a new left-sided internal/external percutaneous biliary drain. The drain is 10 [place] and modified with additional sideholes given the length of the occluded segment. 2. Cholangiogram through existing tube on the right demonstrates a normally functioning right-sided percutaneous biliary drainage catheter. Electronically Signed   By: Jacqulynn Cadet M.D.   On: 12/23/2017 18:18    Assessment: 62 y.o. with recently diagnosed metastatic cholangiocarcinoma, status post percutaneous biliary draining tube placement and exchange, first cycle chemotherapy last week, presented with urinary difficulty, constipation, and decreased oral intake.  1. Urinary difficulty with distended bladder, ? BPH, vs cystitis from chemo (cisplatin), normal  renal function, foley removed now  2.  Constipation, resolved  3. Poor oral intake, dehydration, secondary to chemotherapy and his underlying malignancy, improved some  4.  Metastatic cholangiocarcinoma, status post first cycle chemotherapy cisplatin and gemcitabine on 12/18/17 5.  Obstructive jaundice, secondary to #4, recently biliary tube exchange by IR, worsening hyperbilirubinemia, s/p left biliary drain tube placement 12/17  6. Deconditioning  7. Malnutrition  8. Code status: DNR/DNI   Plan: -His TBIL increased this morning. Both PTC internal and external biliary drain are working well -if bili improved, he can be discharged home  -I again encouraged him to eat well, and try to be physically active -Cancel his chemotherapy tomorrow, rescheduled to later next week -I will follow him in my clinic next week.  [name], MD 12/24/2017  5:44 PM

## 2017-12-24 NOTE — Progress Notes (Signed)
Referring Physician(s): Feng,Y  Supervising Physician: Marybelle Killings  Patient Status:  [name]  Chief Complaint: Metastatic cholangiocarcinoma   Subjective: Pt doing a little better this afternoon; denies fever, chills,resp difficulties, N/V; mildly tender at abd drain sites   Allergies: Morphine and related  Medications: Prior to Admission medications   Medication Sig Start Date End Date Taking? Authorizing Provider  aspirin EC 81 MG tablet Take 81 mg by mouth daily.   Yes [provider]  calcium carbonate (TUMS - DOSED IN MG ELEMENTAL CALCIUM) 500 MG chewable tablet Chew 1 tablet by mouth 2 (two) times daily as needed for indigestion or heartburn.   Yes [provider]  celecoxib (CELEBREX) 200 MG capsule Take 200 mg by mouth at bedtime.  10/10/17  Yes [provider]  fexofenadine (ALLEGRA) 60 MG tablet Take 60 mg by mouth 2 (two) times daily.   Yes [provider]  gabapentin (NEURONTIN) 400 MG capsule Take 400-800 mg by mouth See admin instructions. Take 400 mg by mouth in the morning and 800 mg in the evening   Yes [provider]  lactose free nutrition (BOOST PLUS) LIQD Take 237 mLs by mouth 3 (three) times daily with meals. 12/11/17  Yes Regalado, Belkys A, MD  Multiple Vitamin (MULTIVITAMIN) tablet Take 1 tablet by mouth daily.   Yes [provider]  ondansetron (ZOFRAN) 8 MG tablet Take 1 tablet (8 mg total) by mouth 2 (two) times daily as needed. Start on the third day after chemotherapy. 11/06/17  Yes [name], MD  prochlorperazine (COMPAZINE) 10 MG tablet Take 1 tablet (10 mg total) by mouth every 6 (six) hours as needed (Nausea or vomiting). Patient not taking: Reported on 12/22/2017 11/06/17   [name], MD     Vital Signs: BP 98/60 (BP Location: Right Arm)   Pulse (!) 107   Temp 98.9 F (37.2 C) (Oral)   Resp 16   Ht '5\' 11"'  (1.803 m)   Wt 210 lb (95.3 kg)   SpO2 97%   BMI 29.29 kg/m    Physical Exam both bil drains intact, outputs L- 150 cc , R- 600 cc green bile; dressings dry, sites mildly tender  Imaging: Ct Abdomen Pelvis W Contrast  Result Date: 12/22/2017 CLINICAL DATA:  Intrahepatic cholangiocarcinoma, stage IV, with adrenal and retroperitoneal nodal metastases, diagnosed October 2019. Status post internal external percutaneous biliary drainage. Abdominal pain. Unable to micturate. Constipation. EXAM: CT ABDOMEN AND PELVIS WITH CONTRAST TECHNIQUE: Multidetector CT imaging of the abdomen and pelvis was performed using the standard protocol following bolus administration of intravenous contrast. CONTRAST:  146m ISOVUE-300 IOPAMIDOL (ISOVUE-300) INJECTION 61% COMPARISON:  12/03/2017 CT abdomen/pelvis. FINDINGS: Lower chest: Solid irregular 3.0 cm left lower lobe lung mass (series 6/image 38), stable. Bandlike consolidation in the basilar right lower lobe base slightly decreased, favoring evolving postinfectious/postinflammatory scarring. Coronary atherosclerosis. Tip of right sided Port-A-Cath is seen at the cavoatrial junction. Enlarged 1.8 cm right infrahilar node is stable since 10/27/2017 chest CT. Hepatobiliary: Liver surface is diffusely irregular, compatible with hepatic cirrhosis. Ill-defined infiltrative 5.8 x 4.8 cm hypoenhancing central left liver lobe mass (series 2/image 19), previously 5.5 x 5.0 cm on 12/03/2017 CT using similar measurement technique, not appreciably changed. Stable moderate intrahepatic biliary ductal dilatation throughout the left lower lobe peripheral to this mass. Multiple (at least 5) liver masses scattered throughout the periphery of the liver have not appreciably changed, largest 3.6 cm in the anterior superior liver (series 2/image 13)  and 3.5 cm in the far inferior right liver lobe (series 2/image 32). Stable position of percutaneous internal external biliary drain traversing the right liver lobe and terminating within the descending duodenal  lumen, with no significant intrahepatic biliary ductal dilatation in the right liver lobe. Expected pneumobilia within the nondependent gallbladder, which otherwise appears normal with no radiopaque cholelithiasis. Normal caliber common bile duct. Pancreas: Normal, with no mass or duct dilation. Spleen: Stable moderate splenomegaly with craniocaudal splenic length 17.0 cm. No splenic mass. Adrenals/Urinary Tract: Stable bilateral adrenal masses measuring 5.1 cm on the right and 2.5 cm on the left. No hydronephrosis. Nonobstructing 2 mm lower left renal stone. No renal masses. Prominently distended and otherwise normal urinary bladder. Stomach/Bowel: Normal non-distended stomach. Normal caliber small bowel with no small bowel wall thickening. Normal appendix. Stable postsurgical changes from partial left colectomy. Large amount of stool throughout remnant colon and rectum. New mild circumferential rectal wall thickening with slight perirectal fat haziness. Rectal diameter 7.7 cm. No definite rectal pneumatosis or perirectal free air. No additional sites of large bowel wall thickening. Vascular/Lymphatic: Atherosclerotic nonaneurysmal abdominal aorta. Patent main and right portal, splenic, hepatic and renal veins. 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 Stable aortocaval adenopathy up to 1.8 cm (series 2/image 42). Stable enlarged 1.4 cm left para-aortic node (series 2/image 42). Stable enlarged 1.9 cm left mesenteric node (series 2/image 61). No new pathologically enlarged lymph nodes in the abdomen or pelvis. Reproductive: Mildly enlarged prostate. Other: No pneumoperitoneum. No focal fluid collection. Trace perihepatic ascites, decreased. Musculoskeletal: No aggressive appearing focal osseous lesions. Marked thoracolumbar spondylosis with stable bilateral L5 pars defects. IMPRESSION: 1. Prominently distended and otherwise normal urinary bladder with mild prostatomegaly. Findings suggest acute bladder outlet  obstruction. No hydronephrosis. 2. Large colorectal stool volume compatible with constipation. New mild circumferential rectal wall thickening and perirectal fat haziness suggesting stercoral colitis. No free air. No abscess. 3. Stable infiltrative cholangiocarcinoma in the central left liver lobe with associated peripheral intrahepatic biliary ductal dilatation throughout the left liver lobe. 4. Stable well-positioned internal external drain traversing the right liver lobe and decompressing the right liver biliary system. 5. Stable liver, bilateral adrenal, retroperitoneal, left mesenteric and right infrahilar nodal and left lung base metastases. No progressive metastatic disease since 12/03/2017 CT. 6. Cirrhosis. Trace perihepatic ascites, decreased. Stable moderate splenomegaly. 7.  Aortic Atherosclerosis (ICD10-I70.0). Electronically Signed   By: Ilona Sorrel M.D.   On: 12/22/2017 15:01   Dg Abd Acute W/chest  Result Date: 12/22/2017 CLINICAL DATA:  Metastatic cancer with constipation EXAM: DG ABDOMEN ACUTE W/ 1V CHEST COMPARISON:  12/03/2017 FINDINGS: Formed stool distends the colon diffusely. No small bowel dilatation or evident pneumoperitoneum. Percutaneous biliary catheter in unremarkable position. Porta catheter with tip at the upper cavoatrial junction. There is no edema, consolidation, effusion, or pneumothorax. Normal heart size IMPRESSION: Generalized stool retention consistent with history of constipation. Electronically Signed   By: Monte Fantasia M.D.   On: 12/22/2017 10:00   Ir Cholangiogram Existing Tube  Result Date: 12/23/2017 INDICATION: 62 year old male with metastatic cholangiocarcinoma, jaundice, elevated bilirubin and new onset high-grade fever concerning for acute cholangitis. He has a previously placed percutaneous biliary drain via the right system. However, on CT imaging he has persistent obstruction of the left biliary system secondary to a central hepatic mass. The left  system is currently undrained. Additionally, the right anterior system is poorly drained. Therefore, he presents for placement of a left-sided biliary drain and also a drain injection of the  existing right-sided drainage catheter to make sure it is not clogged or malpositioned. EXAM: 1. Percutaneous transhepatic cholangiogram 2. Placement of an internal/external percutaneous biliary drain via the left 3. Cholangiogram through existing catheter via the existing right percutaneous biliary drain MEDICATIONS: 1 g Meropenem; The antibiotic was administered within an appropriate time frame prior to the initiation of the procedure. ANESTHESIA/SEDATION: Moderate (conscious) sedation was employed during this procedure. A total of Versed 4 mg and Fentanyl 25 mcg was administered intravenously. Moderate Sedation Time: 26 minutes. The patient's level of consciousness and vital signs were monitored continuously by radiology nursing throughout the procedure under my direct supervision. FLUOROSCOPY TIME:  Fluoroscopy Time: 6 minutes 0 seconds (216 mGy). COMPLICATIONS: None immediate. PROCEDURE: Informed written consent was obtained from the patient after a thorough discussion of the procedural risks, benefits and alternatives. All questions were addressed. Maximal Sterile Barrier Technique was utilized including caps, mask, sterile gowns, sterile gloves, sterile drape, hand hygiene and skin antiseptic. A timeout was performed prior to the initiation of the procedure. The mid epigastric region was interrogated with ultrasound. There is biliary ductal dilatation. A suitable peripheral biliary radicle was identified. Local anesthesia was attained by infiltration with 1% lidocaine. A small dermatotomy was made. Under real-time sonographic guidance, the bile duct was punctured using a 22 gauge Chiba needle. There was return of purulent appearing bile. A percutaneous transhepatic cholangiogram was performed. There is diffuse dilatation  of the left intrahepatic ducts with complete occlusion of the left main hepatic duct. A Nitrex wire was carefully advanced into the left ductal system. The Chiba needle was removed and the 4 [place] hydrophilic transitional sheath advanced over the wire and positioned in the central left hepatic ducts. A glidewire was then successfully navigated through the occlusion and into the common bile duct. The sheath was advanced of the common bile duct. The glidewire was exchanged for an Amplatz wire which was advanced into the duodenum. Given the relatively long common bile duct and the peripheral access site in the left ductal system, a 10 [place] biliary drain was modified with additional sideholes. The percutaneous tract was then dilated to 10 [place]. The modified 10 [place] biliary drain was then advanced over the wire and formed in the duodenum. The most proximal side hole is at the site of the biliary duct entry. This is visible on the images and marked by the tip of a Bentson wire. The catheter was connected to gravity bag drainage and secured to the skin with 0 Prolene suture. Attention was turned to the existing right percutaneous biliary drain. Contrast was injected under fluoroscopy. The drain is patent and functioning normally. No further intervention performed on this side. IMPRESSION: 1. Successful placement of a new left-sided internal/external percutaneous biliary drain. The drain is 10 [place] and modified with additional sideholes given the length of the occluded segment. 2. Cholangiogram through existing tube on the right demonstrates a normally functioning right-sided percutaneous biliary drainage catheter. Electronically Signed   By: Jacqulynn Cadet M.D.   On: 12/23/2017 18:18   Ir Int Lianne Cure Biliary Drain With Cholangiogram  Result Date: 12/23/2017 INDICATION: 62 year old male with metastatic cholangiocarcinoma, jaundice, elevated bilirubin and new onset high-grade fever concerning for acute  cholangitis. He has a previously placed percutaneous biliary drain via the right system. However, on CT imaging he has persistent obstruction of the left biliary system secondary to a central hepatic mass. The left system is currently undrained. Additionally, the right anterior system is poorly drained. Therefore, he presents  for placement of a left-sided biliary drain and also a drain injection of the existing right-sided drainage catheter to make sure it is not clogged or malpositioned. EXAM: 1. Percutaneous transhepatic cholangiogram 2. Placement of an internal/external percutaneous biliary drain via the left 3. Cholangiogram through existing catheter via the existing right percutaneous biliary drain MEDICATIONS: 1 g Meropenem; The antibiotic was administered within an appropriate time frame prior to the initiation of the procedure. ANESTHESIA/SEDATION: Moderate (conscious) sedation was employed during this procedure. A total of Versed 4 mg and Fentanyl 25 mcg was administered intravenously. Moderate Sedation Time: 26 minutes. The patient's level of consciousness and vital signs were monitored continuously by radiology nursing throughout the procedure under my direct supervision. FLUOROSCOPY TIME:  Fluoroscopy Time: 6 minutes 0 seconds (216 mGy). COMPLICATIONS: None immediate. PROCEDURE: Informed written consent was obtained from the patient after a thorough discussion of the procedural risks, benefits and alternatives. All questions were addressed. Maximal Sterile Barrier Technique was utilized including caps, mask, sterile gowns, sterile gloves, sterile drape, hand hygiene and skin antiseptic. A timeout was performed prior to the initiation of the procedure. The mid epigastric region was interrogated with ultrasound. There is biliary ductal dilatation. A suitable peripheral biliary radicle was identified. Local anesthesia was attained by infiltration with 1% lidocaine. A small dermatotomy was made. Under  real-time sonographic guidance, the bile duct was punctured using a 22 gauge Chiba needle. There was return of purulent appearing bile. A percutaneous transhepatic cholangiogram was performed. There is diffuse dilatation of the left intrahepatic ducts with complete occlusion of the left main hepatic duct. A Nitrex wire was carefully advanced into the left ductal system. The Chiba needle was removed and the 4 [place] hydrophilic transitional sheath advanced over the wire and positioned in the central left hepatic ducts. A glidewire was then successfully navigated through the occlusion and into the common bile duct. The sheath was advanced of the common bile duct. The glidewire was exchanged for an Amplatz wire which was advanced into the duodenum. Given the relatively long common bile duct and the peripheral access site in the left ductal system, a 10 [place] biliary drain was modified with additional sideholes. The percutaneous tract was then dilated to 10 [place]. The modified 10 [place] biliary drain was then advanced over the wire and formed in the duodenum. The most proximal side hole is at the site of the biliary duct entry. This is visible on the images and marked by the tip of a Bentson wire. The catheter was connected to gravity bag drainage and secured to the skin with 0 Prolene suture. Attention was turned to the existing right percutaneous biliary drain. Contrast was injected under fluoroscopy. The drain is patent and functioning normally. No further intervention performed on this side. IMPRESSION: 1. Successful placement of a new left-sided internal/external percutaneous biliary drain. The drain is 10 [place] and modified with additional sideholes given the length of the occluded segment. 2. Cholangiogram through existing tube on the right demonstrates a normally functioning right-sided percutaneous biliary drainage catheter. Electronically Signed   By: Jacqulynn Cadet M.D.   On: 12/23/2017 18:18     Labs:  CBC: Recent Labs    12/17/17 1145 12/22/17 1004 12/22/17 1146 12/23/17 0452 12/24/17 0636  WBC 8.2 6.5  --  5.4 5.2  HGB 10.0* 9.4* 9.5* 8.5* 7.8*  HCT 30.7* 30.1* 28.0* 27.7* 24.6*  PLT 374 300  --  256 131*    COAGS: Recent Labs    12/03/17 0953 12/09/17  4320 12/22/17 1004 12/23/17 0452  INR 1.51 1.29 1.15 1.18    BMP: Recent Labs    12/17/17 1145 12/22/17 1004 12/22/17 1146 12/23/17 0452 12/24/17 0636  NA 136 132* 135 131* 127*  K 4.3 4.0 4.2 3.9 3.8  CL 102 98 102 102 99  CO2 27 26  --  23 18*  GLUCOSE 147* 163* 144* 178* 149*  BUN '12 19 16 13 23  ' CALCIUM 9.0 9.0  --  8.5* 7.8*  CREATININE 0.72 0.54* 0.40* 0.52* 1.25*  GFRNONAA >60 >60  --  >60 >60  GFRAA >60 >60  --  >60 >60    LIVER FUNCTION TESTS: Recent Labs    12/17/17 1145 12/22/17 1004 12/23/17 0452 12/24/17 0636  BILITOT 4.5* 4.8* 3.2* 5.6*  AST 155* 120* 86* 65*  ALT 168* 143* 112* 80*  ALKPHOS 1,076* 525* 419* 305*  PROT 7.8 8.0 6.9 6.4*  ALBUMIN 2.5* 2.9* 2.5* 2.2*    Assessment and Plan: Pt with history of stage IV metastatic cholangiocarcinoma with biliary obstruction, status post right hepatic lobe PTC and internal/external biliary drain on 10/26/2017 followed by exchange on 11/07/17 and 12/09/17 followed by capping of drain.   He has been receiving chemotherapy.  He presented to the emergency room 12/16 with decreased urine output ,constipation and some abdominal discomfort.  Due to worsening bilirubin the external drain was uncapped.  Follow-up CT done yesterday revealed prominently distended bladder with mildly enlarged prostate, stable infiltrative cholangiocarcinoma in the central left liver with associated peripheral intrahepatic biliary ductal dilatation throughout the left lobe and stable well-positioned internal/external drain of the right liver, no progressive metastatic disease, cirrhosis, trace ascites, stable moderate splenomegaly. S/p left I/E biliary drain  placement 12/17; rt biliary drain with nl function by cholangiogram;   afebrile; WBC nl; hgb 7.8(8.5), creat 1.25, t bili 5.6(3.2), bile cx pend; blood cx- gm neg rods; cont drain irrigation/antbx, hydration, close lab monitoring; check sensitivities of new cx; other plans as per IM/oncology   Electronically Signed: D. Rowe Robert, PA-C 12/24/2017, 3:10 PM   I spent a total of 15 minutes at the the patient's bedside AND on the patient's hospital floor or unit, greater than 50% of which was counseling/coordinating care for bilateral biliary drains    Patient ID: Matthew Lewis, male   DOB: [birth year], 62 y.o.   MRN: [id number]

## 2017-12-25 ENCOUNTER — Inpatient Hospital Stay: Payer: Medicare Other | Admitting: Hematology

## 2017-12-25 ENCOUNTER — Inpatient Hospital Stay: Payer: Medicare Other

## 2017-12-25 ENCOUNTER — Telehealth: Payer: Self-pay | Admitting: Hematology

## 2017-12-25 DIAGNOSIS — Z7189 Other specified counseling: Secondary | ICD-10-CM

## 2017-12-25 DIAGNOSIS — Z515 Encounter for palliative care: Secondary | ICD-10-CM

## 2017-12-25 LAB — CBC WITH DIFFERENTIAL/PLATELET
Abs Immature Granulocytes: 0.03 10*3/uL (ref 0.00–0.07)
Basophils Absolute: 0 10*3/uL (ref 0.0–0.1)
Basophils Relative: 0 %
EOS PCT: 3 %
Eosinophils Absolute: 0.2 10*3/uL (ref 0.0–0.5)
HCT: 23.9 % — ABNORMAL LOW (ref 39.0–52.0)
Hemoglobin: 7.4 g/dL — ABNORMAL LOW (ref 13.0–17.0)
Immature Granulocytes: 1 %
Lymphocytes Relative: 13 %
Lymphs Abs: 0.7 10*3/uL (ref 0.7–4.0)
MCH: 29 pg (ref 26.0–34.0)
MCHC: 31 g/dL (ref 30.0–36.0)
MCV: 93.7 fL (ref 80.0–100.0)
Monocytes Absolute: 0.6 10*3/uL (ref 0.1–1.0)
Monocytes Relative: 10 %
Neutro Abs: 4 10*3/uL (ref 1.7–7.7)
Neutrophils Relative %: 73 %
Platelets: 122 10*3/uL — ABNORMAL LOW (ref 150–400)
RBC: 2.55 MIL/uL — ABNORMAL LOW (ref 4.22–5.81)
RDW: 18.1 % — ABNORMAL HIGH (ref 11.5–15.5)
WBC: 5.5 10*3/uL (ref 4.0–10.5)
nRBC: 0 % (ref 0.0–0.2)

## 2017-12-25 LAB — COMPREHENSIVE METABOLIC PANEL
ALT: 70 U/L — ABNORMAL HIGH (ref 0–44)
ANION GAP: 7 (ref 5–15)
AST: 46 U/L — ABNORMAL HIGH (ref 15–41)
Albumin: 2.3 g/dL — ABNORMAL LOW (ref 3.5–5.0)
Alkaline Phosphatase: 239 U/L — ABNORMAL HIGH (ref 38–126)
BUN: 22 mg/dL (ref 8–23)
CO2: 21 mmol/L — ABNORMAL LOW (ref 22–32)
Calcium: 8.4 mg/dL — ABNORMAL LOW (ref 8.9–10.3)
Chloride: 105 mmol/L (ref 98–111)
Creatinine, Ser: 0.63 mg/dL (ref 0.61–1.24)
GFR calc non Af Amer: >60 mL/min (ref >60)
Glucose, Bld: 165 mg/dL — ABNORMAL HIGH (ref 70–99)
Potassium: 3.3 mmol/L — ABNORMAL LOW (ref 3.5–5.1)
Sodium: 133 mmol/L — ABNORMAL LOW (ref 135–145)
Total Bilirubin: 4.3 mg/dL — ABNORMAL HIGH (ref 0.3–1.2)
Total Protein: 6.4 g/dL — ABNORMAL LOW (ref 6.5–8.1)

## 2017-12-25 LAB — GLUCOSE, CAPILLARY: Glucose-Capillary: 245 mg/dL — ABNORMAL HIGH (ref 70–99)

## 2017-12-25 LAB — MAGNESIUM: Magnesium: 2 mg/dL (ref 1.7–2.4)

## 2017-12-25 MED ORDER — FLUCONAZOLE IN SODIUM CHLORIDE 400-0.9 MG/200ML-% IV SOLN
800.0000 mg | Freq: Once | INTRAVENOUS | Status: DC
  Filled 2017-12-25: qty 400

## 2017-12-25 MED ORDER — SODIUM CHLORIDE 0.9 % IV SOLN
800.0000 mg | Freq: Once | INTRAVENOUS | Status: AC
  Administered 2017-12-25: 800 mg via INTRAVENOUS
  Filled 2017-12-25: qty 400

## 2017-12-25 MED ORDER — FLUCONAZOLE IN SODIUM CHLORIDE 400-0.9 MG/200ML-% IV SOLN
400.0000 mg | INTRAVENOUS | Status: DC
  Administered 2017-12-26 – 2017-12-29 (×4): 400 mg via INTRAVENOUS
  Filled 2017-12-25 (×6): qty 200

## 2017-12-25 MED ORDER — POTASSIUM CHLORIDE CRYS ER 20 MEQ PO TBCR
40.0000 meq | EXTENDED_RELEASE_TABLET | Freq: Once | ORAL | Status: AC
  Administered 2017-12-25: 40 meq via ORAL
  Filled 2017-12-25: qty 2

## 2017-12-25 MED ORDER — FLUCONAZOLE IN SODIUM CHLORIDE 400-0.9 MG/200ML-% IV SOLN: 800.0000 mg | Freq: Once | INTRAVENOUS | Status: DC

## 2017-12-25 MED ORDER — TAMSULOSIN HCL 0.4 MG PO CAPS
0.4000 mg | ORAL_CAPSULE | Freq: Every day | ORAL | Status: DC
  Administered 2017-12-25 – 2017-12-29 (×5): 0.4 mg via ORAL
  Filled 2017-12-25 (×5): qty 1

## 2017-12-25 NOTE — Telephone Encounter (Signed)
Scheduled appt per 12/18 sch message - patient is aware of appt date and time  -

## 2017-12-25 NOTE — Progress Notes (Signed)
Patient ID: Matthew Lewis, male   DOB: 1955-09-22, 62 y.o.   MRN: [id number]  PROGRESS NOTE    Matthew Lewis  BMW:[id number] DOB: [birth year] DOA: 12/22/2017 PCP: [name], MD   Brief Narrative:  62 year old male with history of intrahepatic cholangiocarcinoma with metastasis to the liver and lung on palliative chemotherapy, prior history of alcohol abuse with cirrhosis, status post percutaneous biliary drainage for biliary obstruction, recent hospitalization from December 03, 2017 to December 11, 2017 for septic shock secondary to ESBL E. coli bacteremia status post 2 weeks of IV antibiotics presented on 12/22/2017 with inability to urinate and constipation.  Patient was dehydrated on admission.  Oncology recommended IR evaluation for increasing bilirubin levels.  Patient underwent IR guided percutaneous biliary drain replacement on 12/23/2017.   Assessment & Plan:   Principal Problem:   Dehydration Active Problems:   Hypertension   Hyperlipemia   Diabetes mellitus ([place])   CAD (coronary artery disease)- PCI 2001- no evaluation since   Moderate protein-calorie malnutrition (HCC)   Alcoholic cirrhosis of liver without ascites (HCC)   Biliary obstruction due to malignant neoplasm (HCC)   Transaminitis   Malignant neoplasm metastatic to adrenal gland (HCC)   Intrahepatic cholangiocarcinoma (HCC)   Cholangiocarcinoma (HCC)   Bladder outlet obstruction   Constipation   E. coli bacteremia  -Patient had temperature spike on 12/23/2017 and blood culture is positive for E. coli.  Patient had recent ESBL E. coli bacteremia which was treated with IV antibiotics -Susceptibilities pending.  Continue meropenem.  Will order repeat blood cultures for tomorrow.  If blood cultures persistently positive, Port-A-Cath infection might be a possibility as well.  Dehydration  -Treated with IV fluids.  Improving.  Tolerating diet.  Discontinue IV fluids  Constipation -Improving.   Continue current bowel regimen  Urinary retention status post Foley catheter placement -Foley catheter was removed on 12/24/2017.  Patient is urinating well as per the patient.  We will also add Flomax at bedtime.  Might need outpatient urology evaluation.  Patient wants the Foley catheter removed.  Will give voiding trial after removal of Foley  Intrahepatic cholangiocarcinoma -On palliative chemotherapy.  Oncology following. -Overall prognosis is poor with recurrent bacteremia.  Palliative care evaluation is pending  Moderate malnutrition -Follow nutrition recommendations  Chronic anemia from anemia of chronic disease -Hemoglobin stable.  Transfuse if less than 7  Hypokalemia Replace.  Repeat a.m. labs  History of hypertension -Blood pressure stable.  Currently on the lower side.  Monitor  Diabetes mellitus type 2  -A1c was 4.9 in November 2019.  Continue CBGs with SSI  Alcoholic liver cirrhosis  -Monitor.  No signs of worsening ascites   DVT prophylaxis: Lovenox Code Status: Full Family Communication: No family at bedside Disposition Plan: Home in the 2 to 3 days days if clinically stable and if repeat blood cultures are negative  Consultants: IR/oncology/palliative care  Procedures: Percutaneous biliary drain placement by IR on 12/23/2017 Antimicrobials: Meropenem 1 dose on 12/23/2017.  Meropenem continued from 12/24/2017 onwards  Subjective: Patient seen and examined at bedside.  He denies any worsening abdominal pain, fever, nausea or vomiting overnight.  He is having bowel movements.  He is urinating well as per him.  Objective: Vitals:   12/23/17 2111 12/24/17 1339 12/24/17 2300 12/25/17 0600  BP:  98/60 90/60 (!) 113/59  Pulse: (!) 113 (!) 107 88 82  Resp:  16 16 16   Temp: 98.7 F (37.1 C) 98.9 F (37.2 C) 97.7 F (36.5 C) (!) 97.4  F (36.3 C)  TempSrc: Oral Oral Oral Oral  SpO2: 96% 97% 100% 100%  Weight:      Height:        Intake/Output Summary  (Last 24 hours) at 12/25/2017 1035 Last data filed at 12/25/2017 0818 Gross per 24 hour  Intake 2734.69 ml  Output 3500 ml  Net -765.31 ml   Filed Weights   12/22/17 0818  Weight: 95.3 kg    Examination:  General exam: Appears calm and comfortable.  Looks older than stated age.  No distress Respiratory system: Bilateral decreased breath sounds at bases, no wheezing Cardiovascular system: S1 & S2 heard, rate controlled Gastrointestinal system: Abdomen is nondistended, soft and mildly tender around the drain site. Normal bowel sounds heard. Extremities: No cyanosis, edema   Data Reviewed: I have personally reviewed following labs and imaging studies  CBC: Recent Labs  Lab 12/22/17 1004 12/22/17 1146 12/23/17 0452 12/24/17 0636 12/25/17 0609  WBC 6.5  --  5.4 5.2 5.5  NEUTROABS 5.0  --   --  4.6 4.0  HGB 9.4* 9.5* 8.5* 7.8* 7.4*  HCT 30.1* 28.0* 27.7* 24.6* 23.9*  MCV 94.1  --  93.0 93.9 93.7  PLT 300  --  256 131* 751*   Basic Metabolic Panel: Recent Labs  Lab 12/22/17 1004 12/22/17 1146 12/23/17 0452 12/24/17 0636 12/25/17 0609  NA 132* 135 131* 127* 133*  K 4.0 4.2 3.9 3.8 3.3*  CL 98 102 102 99 105  CO2 26  --  23 18* 21*  GLUCOSE 163* 144* 178* 149* 165*  BUN 19 16 13 23 22   CREATININE 0.54* 0.40* 0.52* 1.25* 0.63  CALCIUM 9.0  --  8.5* 7.8* 8.4*  MG 1.9  --   --   --  2.0   GFR: Estimated Creatinine Clearance: 109.9 mL/min (by C-G formula based on SCr of 0.63 mg/dL). Liver Function Tests: Recent Labs  Lab 12/22/17 1004 12/23/17 0452 12/24/17 0636 12/25/17 0609  AST 120* 86* 65* 46*  ALT 143* 112* 80* 70*  ALKPHOS 525* 419* 305* 239*  BILITOT 4.8* 3.2* 5.6* 4.3*  PROT 8.0 6.9 6.4* 6.4*  ALBUMIN 2.9* 2.5* 2.2* 2.3*   Recent Labs  Lab 12/22/17 1004  LIPASE 26   No results for input(s): AMMONIA in the last 168 hours. Coagulation Profile: Recent Labs  Lab 12/22/17 1004 12/23/17 0452  INR 1.15 1.18   Cardiac Enzymes: No results for  input(s): CKTOTAL, CKMB, CKMBINDEX, TROPONINI in the last 168 hours. BNP (last 3 results) No results for input(s): PROBNP in the last 8760 hours. HbA1C: No results for input(s): HGBA1C in the last 72 hours. CBG: Recent Labs  Lab 12/22/17 2209  GLUCAP 176*   Lipid Profile: No results for input(s): CHOL, HDL, LDLCALC, TRIG, CHOLHDL, LDLDIRECT in the last 72 hours. Thyroid Function Tests: No results for input(s): TSH, T4TOTAL, FREET4, T3FREE, THYROIDAB in the last 72 hours. Anemia Panel: Recent Labs    12/22/17 1733  VITAMINB12 624  FOLATE 25.2  FERRITIN 332  TIBC 287  IRON 91  RETICCTPCT 0.5   Sepsis Labs: No results for input(s): PROCALCITON, LATICACIDVEN in the last 168 hours.  Recent Results (from the past 240 hour(s))  Culture, Urine     Status: None   Collection Time: 12/22/17  5:26 PM  Result Value Ref Range Status   Specimen Description   Final    URINE, RANDOM Performed at [facility] [address] [place], [place] [address]  Special Requests   Final    NONE Performed at [facility], [place] [address] [place], [place] [address]    Culture   Final    NO GROWTH Performed at [facility], [place] [address] [place], [place] [address]    Report Status 12/24/2017 FINAL  Final  Culture, blood (Routine X 2) w Reflex to ID Panel     Status: None (Preliminary result)   Collection Time: 12/23/17  4:11 PM  Result Value Ref Range Status   Specimen Description   Final    BLOOD LEFT ARM Performed at [facility] [address], [place], [place] [address]    Special Requests   Final    BOTTLES DRAWN AEROBIC AND ANAEROBIC Blood Culture adequate volume Performed at [facility] [address] [place], [place] [address]    Culture  Setup Time   Final    GRAM NEGATIVE RODS AEROBIC BOTTLE ONLY CRITICAL RESULT CALLED TO, READ BACK BY AND VERIFIED WITH: E. WILLIAMSON, PHARMD (WL) AT 0915  ON 12/24/17 BY C. JESSUP, MLT.    Culture   Final    GRAM NEGATIVE RODS CULTURE REINCUBATED FOR BETTER GROWTH Performed at [facility], [place] [address] [place], [place] [address]    Report Status PENDING  Incomplete  Culture, blood (Routine X 2) w Reflex to ID Panel     Status: None (Preliminary result)   Collection Time: 12/23/17  4:12 PM  Result Value Ref Range Status   Specimen Description   Final    BLOOD RIGHT ARM Performed at [facility] [address] [place], [place] [address]    Special Requests   Final    BOTTLES DRAWN AEROBIC AND ANAEROBIC Blood Culture results may not be optimal due to an inadequate volume of blood received in culture bottles Performed at [facility] [address], [place], [place] [address]    Culture  Setup Time   Final    GRAM NEGATIVE RODS AEROBIC BOTTLE ONLY CRITICAL RESULT CALLED TO, READ BACK BY AND VERIFIED WITH: E. WILLIAMSON, PHARMD (WL) AT 0915 ON 12/24/17 BY C. JESSUP, MLT.    Culture   Final    GRAM NEGATIVE RODS CULTURE REINCUBATED FOR BETTER GROWTH Performed at [facility], [place] [address], [place], [place] [address]    Report Status PENDING  Incomplete  Blood Culture ID Panel (Reflexed)     Status: Abnormal   Collection Time: 12/23/17  4:12 PM  Result Value Ref Range Status   Enterococcus species NOT DETECTED NOT DETECTED Final   Listeria monocytogenes NOT DETECTED NOT DETECTED Final   Staphylococcus species NOT DETECTED NOT DETECTED Final   Staphylococcus aureus (BCID) NOT DETECTED NOT DETECTED Final   Streptococcus species NOT DETECTED NOT DETECTED Final   Streptococcus agalactiae NOT DETECTED NOT DETECTED Final   Streptococcus pneumoniae NOT DETECTED NOT DETECTED Final   Streptococcus pyogenes NOT DETECTED NOT DETECTED Final   Acinetobacter baumannii NOT DETECTED NOT DETECTED Final   Enterobacteriaceae species DETECTED (A) NOT DETECTED Final    Comment:  Enterobacteriaceae represent a large family of gram-negative bacteria, not a single organism. CRITICAL RESULT CALLED TO, READ BACK BY AND VERIFIED WITH: E. WILLIAMSON, PHARMD (WL) AT 0915 ON 12/24/17 BY C. JESSUP, MLT.    Enterobacter cloacae complex NOT DETECTED NOT DETECTED Final   Escherichia coli DETECTED (A) NOT DETECTED Final    Comment: CRITICAL RESULT CALLED TO, READ  BACK BY AND VERIFIED WITH: E. WILLIAMSON, PHARMD ([facility]) AT 0915 ON 12/24/17 BY C. JESSUP, MLT.    Klebsiella oxytoca NOT DETECTED NOT DETECTED Final   Klebsiella pneumoniae NOT DETECTED NOT DETECTED Final   Proteus species NOT DETECTED NOT DETECTED Final   Serratia marcescens NOT DETECTED NOT DETECTED Final   Carbapenem resistance NOT DETECTED NOT DETECTED Final   Haemophilus influenzae NOT DETECTED NOT DETECTED Final   Neisseria meningitidis NOT DETECTED NOT DETECTED Final   Pseudomonas aeruginosa NOT DETECTED NOT DETECTED Final   Candida albicans NOT DETECTED NOT DETECTED Final   Candida glabrata NOT DETECTED NOT DETECTED Final   Candida krusei NOT DETECTED NOT DETECTED Final   Candida parapsilosis NOT DETECTED NOT DETECTED Final   Candida tropicalis NOT DETECTED NOT DETECTED Final    Comment: Performed at [facility], [place] [address], [place], [place] [address]  Body fluid culture     Status: None (Preliminary result)   Collection Time: 12/23/17 11:52 PM  Result Value Ref Range Status   Specimen Description   Final    BILE Performed at [facility] [address] [place], [place] [address]    Special Requests   Final    Normal Performed at [facility], [place] [address] [place], [place] [address]    Gram Stain   Final    MODERATE WBC PRESENT, PREDOMINANTLY PMN RARE GRAM POSITIVE COCCI RARE GRAM NEGATIVE RODS RARE YEAST    Culture   Final    CULTURE REINCUBATED FOR BETTER GROWTH Performed at [facility], [place] [address], [place], [place] [address]     Report Status PENDING  Incomplete         Radiology Studies: Ir Cholangiogram Existing Tube  Result Date: 12/23/2017 INDICATION: 62 year old male with metastatic cholangiocarcinoma, jaundice, elevated bilirubin and new onset high-grade fever concerning for acute cholangitis. He has a previously placed percutaneous biliary drain via the right system. However, on CT imaging he has persistent obstruction of the left biliary system secondary to a central hepatic mass. The left system is currently undrained. Additionally, the right anterior system is poorly drained. Therefore, he presents for placement of a left-sided biliary drain and also a drain injection of the existing right-sided drainage catheter to make sure it is not clogged or malpositioned. EXAM: 1. Percutaneous transhepatic cholangiogram 2. Placement of an internal/external percutaneous biliary drain via the left 3. Cholangiogram through existing catheter via the existing right percutaneous biliary drain MEDICATIONS: 1 g Meropenem; The antibiotic was administered within an appropriate time frame prior to the initiation of the procedure. ANESTHESIA/SEDATION: Moderate (conscious) sedation was employed during this procedure. A total of Versed 4 mg and Fentanyl 25 mcg was administered intravenously. Moderate Sedation Time: 26 minutes. The patient's level of consciousness and vital signs were monitored continuously by radiology nursing throughout the procedure under my direct supervision. FLUOROSCOPY TIME:  Fluoroscopy Time: 6 minutes 0 seconds (216 mGy). COMPLICATIONS: None immediate. PROCEDURE: Informed written consent was obtained from the patient after a thorough discussion of the procedural risks, benefits and alternatives. All questions were addressed. Maximal Sterile Barrier Technique was utilized including caps, mask, sterile gowns, sterile gloves, sterile drape, hand hygiene and skin antiseptic. A timeout was performed prior to the initiation  of the procedure. The mid epigastric region was interrogated with ultrasound. There is biliary ductal dilatation. A suitable peripheral biliary radicle was identified. Local anesthesia was attained by infiltration with 1% lidocaine. A small dermatotomy was made.  Under real-time sonographic guidance, the bile duct was punctured using a 22 gauge Chiba needle. There was return of purulent appearing bile. A percutaneous transhepatic cholangiogram was performed. There is diffuse dilatation of the left intrahepatic ducts with complete occlusion of the left main hepatic duct. A Nitrex wire was carefully advanced into the left ductal system. The Chiba needle was removed and the 4 [place] hydrophilic transitional sheath advanced over the wire and positioned in the central left hepatic ducts. A glidewire was then successfully navigated through the occlusion and into the common bile duct. The sheath was advanced of the common bile duct. The glidewire was exchanged for an Amplatz wire which was advanced into the duodenum. Given the relatively long common bile duct and the peripheral access site in the left ductal system, a 10 [place] biliary drain was modified with additional sideholes. The percutaneous tract was then dilated to 10 [place]. The modified 10 [place] biliary drain was then advanced over the wire and formed in the duodenum. The most proximal side hole is at the site of the biliary duct entry. This is visible on the images and marked by the tip of a Bentson wire. The catheter was connected to gravity bag drainage and secured to the skin with 0 Prolene suture. Attention was turned to the existing right percutaneous biliary drain. Contrast was injected under fluoroscopy. The drain is patent and functioning normally. No further intervention performed on this side. IMPRESSION: 1. Successful placement of a new left-sided internal/external percutaneous biliary drain. The drain is 10 [place] and modified with additional  sideholes given the length of the occluded segment. 2. Cholangiogram through existing tube on the right demonstrates a normally functioning right-sided percutaneous biliary drainage catheter. Electronically Signed   By: Jacqulynn Cadet M.D.   On: 12/23/2017 18:18   Ir Int Lianne Cure Biliary Drain With Cholangiogram  Result Date: 12/23/2017 INDICATION: 62 year old male with metastatic cholangiocarcinoma, jaundice, elevated bilirubin and new onset high-grade fever concerning for acute cholangitis. He has a previously placed percutaneous biliary drain via the right system. However, on CT imaging he has persistent obstruction of the left biliary system secondary to a central hepatic mass. The left system is currently undrained. Additionally, the right anterior system is poorly drained. Therefore, he presents for placement of a left-sided biliary drain and also a drain injection of the existing right-sided drainage catheter to make sure it is not clogged or malpositioned. EXAM: 1. Percutaneous transhepatic cholangiogram 2. Placement of an internal/external percutaneous biliary drain via the left 3. Cholangiogram through existing catheter via the existing right percutaneous biliary drain MEDICATIONS: 1 g Meropenem; The antibiotic was administered within an appropriate time frame prior to the initiation of the procedure. ANESTHESIA/SEDATION: Moderate (conscious) sedation was employed during this procedure. A total of Versed 4 mg and Fentanyl 25 mcg was administered intravenously. Moderate Sedation Time: 26 minutes. The patient's level of consciousness and vital signs were monitored continuously by radiology nursing throughout the procedure under my direct supervision. FLUOROSCOPY TIME:  Fluoroscopy Time: 6 minutes 0 seconds (216 mGy). COMPLICATIONS: None immediate. PROCEDURE: Informed written consent was obtained from the patient after a thorough discussion of the procedural risks, benefits and alternatives. All questions  were addressed. Maximal Sterile Barrier Technique was utilized including caps, mask, sterile gowns, sterile gloves, sterile drape, hand hygiene and skin antiseptic. A timeout was performed prior to the initiation of the procedure. The mid epigastric region was interrogated with ultrasound. There is biliary ductal dilatation. A suitable peripheral biliary radicle was identified.  Local anesthesia was attained by infiltration with 1% lidocaine. A small dermatotomy was made. Under real-time sonographic guidance, the bile duct was punctured using a 22 gauge Chiba needle. There was return of purulent appearing bile. A percutaneous transhepatic cholangiogram was performed. There is diffuse dilatation of the left intrahepatic ducts with complete occlusion of the left main hepatic duct. A Nitrex wire was carefully advanced into the left ductal system. The Chiba needle was removed and the 4 [place] hydrophilic transitional sheath advanced over the wire and positioned in the central left hepatic ducts. A glidewire was then successfully navigated through the occlusion and into the common bile duct. The sheath was advanced of the common bile duct. The glidewire was exchanged for an Amplatz wire which was advanced into the duodenum. Given the relatively long common bile duct and the peripheral access site in the left ductal system, a 10 [place] biliary drain was modified with additional sideholes. The percutaneous tract was then dilated to 10 [place]. The modified 10 [place] biliary drain was then advanced over the wire and formed in the duodenum. The most proximal side hole is at the site of the biliary duct entry. This is visible on the images and marked by the tip of a Bentson wire. The catheter was connected to gravity bag drainage and secured to the skin with 0 Prolene suture. Attention was turned to the existing right percutaneous biliary drain. Contrast was injected under fluoroscopy. The drain is patent and functioning  normally. No further intervention performed on this side. IMPRESSION: 1. Successful placement of a new left-sided internal/external percutaneous biliary drain. The drain is 10 [place] and modified with additional sideholes given the length of the occluded segment. 2. Cholangiogram through existing tube on the right demonstrates a normally functioning right-sided percutaneous biliary drainage catheter. Electronically Signed   By: Jacqulynn Cadet M.D.   On: 12/23/2017 18:18        Scheduled Meds: . aspirin EC  81 mg Oral Daily  . celecoxib  200 mg Oral QHS  . enoxaparin (LOVENOX) injection  40 mg Subcutaneous Q24H  . gabapentin  400 mg Oral Daily   And  . gabapentin  800 mg Oral QHS  . lactose free nutrition  237 mL Oral TID WC  . loratadine  10 mg Oral Daily  . multivitamin with minerals  1 tablet Oral Daily  . polyethylene glycol  17 g Oral BID  . potassium chloride  40 mEq Oral Once  . senna-docusate  1 tablet Oral BID  . sodium chloride flush  3 mL Intravenous Q12H  . sodium chloride flush  5 mL Intracatheter Q8H   Continuous Infusions: . sodium chloride 50 mL/hr at 12/24/17 1709  . meropenem (MERREM) IV 1 g (12/25/17 0146)     LOS: 2 days        Aline August, MD [facility] Pager [phone]  If 7PM-7AM, please contact night-coverage [email or web address] Password [email or web address] 12/25/2017, 10:35 AM

## 2017-12-25 NOTE — Progress Notes (Signed)
Physical Therapy Discharge Patient Details Name: Matthew Lewis MRN: [id number] DOB: 05/03/1955 Today's Date: 12/25/2017 Time:  -     Patient discharged from PT services secondary to goals met and no further PT needs identified. Pt reports he has been ambulating this morning already and moving on his own, doing well.  Pt declines need for PT at this time and agreeable for PT to sign off.  Please see latest therapy progress note for current level of functioning and progress toward goals.    Progress and discharge plan discussed with patient and/or caregiver: Patient/Caregiver agrees with plan  GP     ,KATHrine E 12/25/2017, 11:27 AM  Kati , PT, DPT Acute Rehabilitation Services Office: [phone] Pager: [phone]  

## 2017-12-25 NOTE — Progress Notes (Signed)
Referring Physician(s): Levin Erp; Eugenie Filler  Supervising Physician: Daryll Brod  Patient Status:  [facility] - In-pt  Chief Complaint: None  Subjective:  Metastatic cholangiocarcinoma s/p drain placement (right placed 10/26/2017 by Dr. Earleen Newport, exchanged 11/07/2017, capped 12/09/2017, uncapped 12/22/2017; left placed 12/23/2017 by Dr. Laurence Ferrari). Patient awake and alert laying in bed. States he is feeling "okay today". Denies abdominal pain. Right and left drains c/d/i.   Allergies: Morphine and related  Medications: Prior to Admission medications   Medication Sig Start Date End Date Taking? Authorizing Provider  aspirin EC 81 MG tablet Take 81 mg by mouth daily.   Yes [provider]  calcium carbonate (TUMS - DOSED IN MG ELEMENTAL CALCIUM) 500 MG chewable tablet Chew 1 tablet by mouth 2 (two) times daily as needed for indigestion or heartburn.   Yes [provider]  celecoxib (CELEBREX) 200 MG capsule Take 200 mg by mouth at bedtime.  10/10/17  Yes [provider]  fexofenadine (ALLEGRA) 60 MG tablet Take 60 mg by mouth 2 (two) times daily.   Yes [provider]  gabapentin (NEURONTIN) 400 MG capsule Take 400-800 mg by mouth See admin instructions. Take 400 mg by mouth in the morning and 800 mg in the evening   Yes [provider]  lactose free nutrition (BOOST PLUS) LIQD Take 237 mLs by mouth 3 (three) times daily with meals. 12/11/17  Yes Regalado, Belkys A, MD  Multiple Vitamin (MULTIVITAMIN) tablet Take 1 tablet by mouth daily.   Yes [provider]  ondansetron (ZOFRAN) 8 MG tablet Take 1 tablet (8 mg total) by mouth 2 (two) times daily as needed. Start on the third day after chemotherapy. 11/06/17  Yes [name], MD  prochlorperazine (COMPAZINE) 10 MG tablet Take 1 tablet (10 mg total) by mouth every 6 (six) hours as needed (Nausea or vomiting). Patient not taking: Reported on 12/22/2017 11/06/17   [name], MD     Vital Signs: BP (!) 113/59 (BP Location: Left Arm)   Pulse 82   Temp (!) 97.4 F (36.3 C) (Oral)   Resp 16   Ht 5' 11"  (1.803 m)   Wt 210 lb (95.3 kg)   SpO2 100%   BMI 29.29 kg/m   Physical Exam Vitals signs and nursing note reviewed.  Constitutional:      General: He is not in acute distress.    Appearance: Normal appearance.  Pulmonary:     Effort: Pulmonary effort is normal. No respiratory distress.  Abdominal:     Tenderness: There is no abdominal tenderness.     Comments: Left and right drain incision sites without erythema, drainage, or tenderness; 100 mL cloudy green bile in left gravity bag, 200 mL cloudy green bile in right gravity bag; left and right drains flush/aspirate without resistance.  Skin:    General: Skin is warm and dry.  Neurological:     Mental Status: He is alert and oriented to person, place, and time.  Psychiatric:        Mood and Affect: Mood normal.        Behavior: Behavior normal.        Thought Content: Thought content normal.        Judgment: Judgment normal.     Imaging: Ct Abdomen Pelvis W Contrast  Result Date: 12/22/2017 CLINICAL DATA:  Intrahepatic cholangiocarcinoma, stage IV, with adrenal and retroperitoneal nodal metastases, diagnosed October 2019. Status post internal external percutaneous biliary drainage. Abdominal pain. Unable  to micturate. Constipation. EXAM: CT ABDOMEN AND PELVIS WITH CONTRAST TECHNIQUE: Multidetector CT imaging of the abdomen and pelvis was performed using the standard protocol following bolus administration of intravenous contrast. CONTRAST:  157m ISOVUE-300 IOPAMIDOL (ISOVUE-300) INJECTION 61% COMPARISON:  12/03/2017 CT abdomen/pelvis. FINDINGS: Lower chest: Solid irregular 3.0 cm left lower lobe lung mass (series 6/image 38), stable. Bandlike consolidation in the basilar right lower lobe base slightly decreased, favoring evolving postinfectious/postinflammatory scarring. Coronary  atherosclerosis. Tip of right sided Port-A-Cath is seen at the cavoatrial junction. Enlarged 1.8 cm right infrahilar node is stable since 10/27/2017 chest CT. Hepatobiliary: Liver surface is diffusely irregular, compatible with hepatic cirrhosis. Ill-defined infiltrative 5.8 x 4.8 cm hypoenhancing central left liver lobe mass (series 2/image 19), previously 5.5 x 5.0 cm on 12/03/2017 CT using similar measurement technique, not appreciably changed. Stable moderate intrahepatic biliary ductal dilatation throughout the left lower lobe peripheral to this mass. Multiple (at least 5) liver masses scattered throughout the periphery of the liver have not appreciably changed, largest 3.6 cm in the anterior superior liver (series 2/image 13) and 3.5 cm in the far inferior right liver lobe (series 2/image 32). Stable position of percutaneous internal external biliary drain traversing the right liver lobe and terminating within the descending duodenal lumen, with no significant intrahepatic biliary ductal dilatation in the right liver lobe. Expected pneumobilia within the nondependent gallbladder, which otherwise appears normal with no radiopaque cholelithiasis. Normal caliber common bile duct. Pancreas: Normal, with no mass or duct dilation. Spleen: Stable moderate splenomegaly with craniocaudal splenic length 17.0 cm. No splenic mass. Adrenals/Urinary Tract: Stable bilateral adrenal masses measuring 5.1 cm on the right and 2.5 cm on the left. No hydronephrosis. Nonobstructing 2 mm lower left renal stone. No renal masses. Prominently distended and otherwise normal urinary bladder. Stomach/Bowel: Normal non-distended stomach. Normal caliber small bowel with no small bowel wall thickening. Normal appendix. Stable postsurgical changes from partial left colectomy. Large amount of stool throughout remnant colon and rectum. New mild circumferential rectal wall thickening with slight perirectal fat haziness. Rectal diameter 7.7 cm.  No definite rectal pneumatosis or perirectal free air. No additional sites of large bowel wall thickening. Vascular/Lymphatic: Atherosclerotic nonaneurysmal abdominal aorta. Patent main and right portal, splenic, hepatic and renal veins. 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 Stable aortocaval adenopathy up to 1.8 cm (series 2/image 42). Stable enlarged 1.4 cm left para-aortic node (series 2/image 42). Stable enlarged 1.9 cm left mesenteric node (series 2/image 61). No new pathologically enlarged lymph nodes in the abdomen or pelvis. Reproductive: Mildly enlarged prostate. Other: No pneumoperitoneum. No focal fluid collection. Trace perihepatic ascites, decreased. Musculoskeletal: No aggressive appearing focal osseous lesions. Marked thoracolumbar spondylosis with stable bilateral L5 pars defects. IMPRESSION: 1. Prominently distended and otherwise normal urinary bladder with mild prostatomegaly. Findings suggest acute bladder outlet obstruction. No hydronephrosis. 2. Large colorectal stool volume compatible with constipation. New mild circumferential rectal wall thickening and perirectal fat haziness suggesting stercoral colitis. No free air. No abscess. 3. Stable infiltrative cholangiocarcinoma in the central left liver lobe with associated peripheral intrahepatic biliary ductal dilatation throughout the left liver lobe. 4. Stable well-positioned internal external drain traversing the right liver lobe and decompressing the right liver biliary system. 5. Stable liver, bilateral adrenal, retroperitoneal, left mesenteric and right infrahilar nodal and left lung base metastases. No progressive metastatic disease since 12/03/2017 CT. 6. Cirrhosis. Trace perihepatic ascites, decreased. Stable moderate splenomegaly. 7.  Aortic Atherosclerosis (ICD10-I70.0). Electronically Signed   By: JIlona SorrelM.D.   On:  12/22/2017 15:01   Dg Abd Acute W/chest  Result Date: 12/22/2017 CLINICAL DATA:  Metastatic cancer with  constipation EXAM: DG ABDOMEN ACUTE W/ 1V CHEST COMPARISON:  12/03/2017 FINDINGS: Formed stool distends the colon diffusely. No small bowel dilatation or evident pneumoperitoneum. Percutaneous biliary catheter in unremarkable position. Porta catheter with tip at the upper cavoatrial junction. There is no edema, consolidation, effusion, or pneumothorax. Normal heart size IMPRESSION: Generalized stool retention consistent with history of constipation. Electronically Signed   By: Monte Fantasia M.D.   On: 12/22/2017 10:00   Ir Cholangiogram Existing Tube  Result Date: 12/23/2017 INDICATION: 62 year old male with metastatic cholangiocarcinoma, jaundice, elevated bilirubin and new onset high-grade fever concerning for acute cholangitis. He has a previously placed percutaneous biliary drain via the right system. However, on CT imaging he has persistent obstruction of the left biliary system secondary to a central hepatic mass. The left system is currently undrained. Additionally, the right anterior system is poorly drained. Therefore, he presents for placement of a left-sided biliary drain and also a drain injection of the existing right-sided drainage catheter to make sure it is not clogged or malpositioned. EXAM: 1. Percutaneous transhepatic cholangiogram 2. Placement of an internal/external percutaneous biliary drain via the left 3. Cholangiogram through existing catheter via the existing right percutaneous biliary drain MEDICATIONS: 1 g Meropenem; The antibiotic was administered within an appropriate time frame prior to the initiation of the procedure. ANESTHESIA/SEDATION: Moderate (conscious) sedation was employed during this procedure. A total of Versed 4 mg and Fentanyl 25 mcg was administered intravenously. Moderate Sedation Time: 26 minutes. The patient's level of consciousness and vital signs were monitored continuously by radiology nursing throughout the procedure under my direct supervision. FLUOROSCOPY  TIME:  Fluoroscopy Time: 6 minutes 0 seconds (216 mGy). COMPLICATIONS: None immediate. PROCEDURE: Informed written consent was obtained from the patient after a thorough discussion of the procedural risks, benefits and alternatives. All questions were addressed. Maximal Sterile Barrier Technique was utilized including caps, mask, sterile gowns, sterile gloves, sterile drape, hand hygiene and skin antiseptic. A timeout was performed prior to the initiation of the procedure. The mid epigastric region was interrogated with ultrasound. There is biliary ductal dilatation. A suitable peripheral biliary radicle was identified. Local anesthesia was attained by infiltration with 1% lidocaine. A small dermatotomy was made. Under real-time sonographic guidance, the bile duct was punctured using a 22 gauge Chiba needle. There was return of purulent appearing bile. A percutaneous transhepatic cholangiogram was performed. There is diffuse dilatation of the left intrahepatic ducts with complete occlusion of the left main hepatic duct. A Nitrex wire was carefully advanced into the left ductal system. The Chiba needle was removed and the 4 [place] hydrophilic transitional sheath advanced over the wire and positioned in the central left hepatic ducts. A glidewire was then successfully navigated through the occlusion and into the common bile duct. The sheath was advanced of the common bile duct. The glidewire was exchanged for an Amplatz wire which was advanced into the duodenum. Given the relatively long common bile duct and the peripheral access site in the left ductal system, a 10 [place] biliary drain was modified with additional sideholes. The percutaneous tract was then dilated to 10 [place]. The modified 10 [place] biliary drain was then advanced over the wire and formed in the duodenum. The most proximal side hole is at the site of the biliary duct entry. This is visible on the images and marked by the tip of a Bentson wire. The  catheter  was connected to gravity bag drainage and secured to the skin with 0 Prolene suture. Attention was turned to the existing right percutaneous biliary drain. Contrast was injected under fluoroscopy. The drain is patent and functioning normally. No further intervention performed on this side. IMPRESSION: 1. Successful placement of a new left-sided internal/external percutaneous biliary drain. The drain is 10 [place] and modified with additional sideholes given the length of the occluded segment. 2. Cholangiogram through existing tube on the right demonstrates a normally functioning right-sided percutaneous biliary drainage catheter. Electronically Signed   By: Jacqulynn Cadet M.D.   On: 12/23/2017 18:18   Ir Int Lianne Cure Biliary Drain With Cholangiogram  Result Date: 12/23/2017 INDICATION: 62 year old male with metastatic cholangiocarcinoma, jaundice, elevated bilirubin and new onset high-grade fever concerning for acute cholangitis. He has a previously placed percutaneous biliary drain via the right system. However, on CT imaging he has persistent obstruction of the left biliary system secondary to a central hepatic mass. The left system is currently undrained. Additionally, the right anterior system is poorly drained. Therefore, he presents for placement of a left-sided biliary drain and also a drain injection of the existing right-sided drainage catheter to make sure it is not clogged or malpositioned. EXAM: 1. Percutaneous transhepatic cholangiogram 2. Placement of an internal/external percutaneous biliary drain via the left 3. Cholangiogram through existing catheter via the existing right percutaneous biliary drain MEDICATIONS: 1 g Meropenem; The antibiotic was administered within an appropriate time frame prior to the initiation of the procedure. ANESTHESIA/SEDATION: Moderate (conscious) sedation was employed during this procedure. A total of Versed 4 mg and Fentanyl 25 mcg was administered  intravenously. Moderate Sedation Time: 26 minutes. The patient's level of consciousness and vital signs were monitored continuously by radiology nursing throughout the procedure under my direct supervision. FLUOROSCOPY TIME:  Fluoroscopy Time: 6 minutes 0 seconds (216 mGy). COMPLICATIONS: None immediate. PROCEDURE: Informed written consent was obtained from the patient after a thorough discussion of the procedural risks, benefits and alternatives. All questions were addressed. Maximal Sterile Barrier Technique was utilized including caps, mask, sterile gowns, sterile gloves, sterile drape, hand hygiene and skin antiseptic. A timeout was performed prior to the initiation of the procedure. The mid epigastric region was interrogated with ultrasound. There is biliary ductal dilatation. A suitable peripheral biliary radicle was identified. Local anesthesia was attained by infiltration with 1% lidocaine. A small dermatotomy was made. Under real-time sonographic guidance, the bile duct was punctured using a 22 gauge Chiba needle. There was return of purulent appearing bile. A percutaneous transhepatic cholangiogram was performed. There is diffuse dilatation of the left intrahepatic ducts with complete occlusion of the left main hepatic duct. A Nitrex wire was carefully advanced into the left ductal system. The Chiba needle was removed and the 4 [place] hydrophilic transitional sheath advanced over the wire and positioned in the central left hepatic ducts. A glidewire was then successfully navigated through the occlusion and into the common bile duct. The sheath was advanced of the common bile duct. The glidewire was exchanged for an Amplatz wire which was advanced into the duodenum. Given the relatively long common bile duct and the peripheral access site in the left ductal system, a 10 [place] biliary drain was modified with additional sideholes. The percutaneous tract was then dilated to 10 [place]. The modified 10 [place]  biliary drain was then advanced over the wire and formed in the duodenum. The most proximal side hole is at the site of the biliary duct entry. This is visible  on the images and marked by the tip of a Bentson wire. The catheter was connected to gravity bag drainage and secured to the skin with 0 Prolene suture. Attention was turned to the existing right percutaneous biliary drain. Contrast was injected under fluoroscopy. The drain is patent and functioning normally. No further intervention performed on this side. IMPRESSION: 1. Successful placement of a new left-sided internal/external percutaneous biliary drain. The drain is 10 [place] and modified with additional sideholes given the length of the occluded segment. 2. Cholangiogram through existing tube on the right demonstrates a normally functioning right-sided percutaneous biliary drainage catheter. Electronically Signed   By: Jacqulynn Cadet M.D.   On: 12/23/2017 18:18    Labs:  CBC: Recent Labs    12/22/17 1004 12/22/17 1146 12/23/17 0452 12/24/17 0636 12/25/17 0609  WBC 6.5  --  5.4 5.2 5.5  HGB 9.4* 9.5* 8.5* 7.8* 7.4*  HCT 30.1* 28.0* 27.7* 24.6* 23.9*  PLT 300  --  256 131* 122*    COAGS: Recent Labs    12/03/17 0953 12/09/17 0613 12/22/17 1004 12/23/17 0452  INR 1.51 1.29 1.15 1.18    BMP: Recent Labs    12/22/17 1004 12/22/17 1146 12/23/17 0452 12/24/17 0636 12/25/17 0609  NA 132* 135 131* 127* 133*  K 4.0 4.2 3.9 3.8 3.3*  CL 98 102 102 99 105  CO2 26  --  23 18* 21*  GLUCOSE 163* 144* 178* 149* 165*  BUN 19 16 13 23 22   CALCIUM 9.0  --  8.5* 7.8* 8.4*  CREATININE 0.54* 0.40* 0.52* 1.25* 0.63  GFRNONAA >60  --  >60 >60 >60  GFRAA >60  --  >60 >60 >60    LIVER FUNCTION TESTS: Recent Labs    12/22/17 1004 12/23/17 0452 12/24/17 0636 12/25/17 0609  BILITOT 4.8* 3.2* 5.6* 4.3*  AST 120* 86* 65* 46*  ALT 143* 112* 80* 70*  ALKPHOS 525* 419* 305* 239*  PROT 8.0 6.9 6.4* 6.4*  ALBUMIN 2.9* 2.5* 2.2*  2.3*    Assessment and Plan:  Metastatic cholangiocarcinoma s/p drain placement (right placed 10/26/2017 by Dr. Earleen Newport, exchanged 11/07/2017, capped 12/09/2017, uncapped 12/22/2017; left placed 12/23/2017 by Dr. Laurence Ferrari). Left and right drains stable with 100 mL from left drain and 200 mL from right drain in gravity bags. Continue with Qshift flushes and monitoring of output. Appreciate and agree with IM/oncology management. IR to follow.   Electronically Signed: Earley Abide, PA-C 12/25/2017, 1:31 PM   I spent a total of 25 Minutes at the the patient's bedside AND on the patient's hospital floor or unit, greater than 50% of which was counseling/coordinating care for metastatic cholangiocarcinoma s/p drain placement x2.

## 2017-12-25 NOTE — Consult Note (Signed)
Consultation Note Date: 12/25/2017   Patient Name: Matthew Lewis  DOB: [birth year]  MRN: [id number]  Age / Sex: 62 y.o., male  PCP: [name], MD Referring Physician: Aline August, MD  Reason for Consultation: Establishing goals of care  HPI/Patient Profile: 62 y.o. male admitted on 12/22/2017    Clinical Assessment and Goals of Care:  62 year old male with metastatic cholangiocarcinoma with involvement of the liver, cirrhosis due to history of alcohol use, colon cancer s/p hemicolectomy, diabetes, and recurrent bacteremia.  We discussed clinical course as well as wishes moving forward in regard to care this hospitalization.  Values and goals of care important to patient and family were attempted to be elicited.  Concept of Hospice and Palliative Care were discussed  Questions and concerns addressed.   PMT will continue to support holistically.  NEXT OF KIN  patient lives at home with his wife and daughter.  SUMMARY OF RECOMMENDATIONS   - DNR/DNI - Patient knows he has serious illness but hopes to have more time. Understands that things may change quickly, but wants to have a additional treatments to prolong his life if they are offered.  Code Status/Advance Care Planning:  DNR   Symptom Management:    continue current mode of care  Palliative Prophylaxis:   Delirium Protocol   Psycho-social/Spiritual:   Desire for further Chaplaincy support:yes  Additional Recommendations: Caregiving  Support/Resources  Prognosis:   Unable to determine     Primary Diagnoses: Present on Admission: . Dehydration . Bladder outlet obstruction . Constipation . Hypertension . Hyperlipemia . CAD (coronary artery disease)- PCI 2001- no evaluation since . Moderate protein-calorie malnutrition ([place]) . Alcoholic cirrhosis of liver without ascites ([place]) . Biliary obstruction due to  malignant neoplasm ([place]) . Transaminitis . Malignant neoplasm metastatic to adrenal gland ([place]) . Intrahepatic cholangiocarcinoma ([place]) . Cholangiocarcinoma ([place])   I have reviewed the medical record, interviewed the patient and family, and examined the patient. The following aspects are pertinent.  Past Medical History:  Diagnosis Date  . Abscess of anal and rectal regions    horse shoe abscess  . Alcoholic cirrhosis ([place])   . Arthritis    "everywhere; especially in my spine"  . Cirrhosis ([place])    STAGE 1 CIRRHOSIS PATIENT SEES DR NAMDIGAM FOR  . Colon cancer ([place]) 05/2005   TX SURGERY WITH LYMPH NODE REMOVAL  . Cough    LAST FEW WEEKS SAW DR RAMOS LUNGS MILKY SPUTUM OCC  . DM type 2 with diabetic peripheral neuropathy (HCC)    left foot  . Fatigue   . Fistula, anal    multiple  . History of substance abuse ([place])    last alcohol was 08/2011; last marijuania was 08/2011  . Hypertension   . PTSD (post-traumatic stress disorder)    "severe" HX OF  . Sleep deprivation    Social History   Socioeconomic History  . Marital status: Married    Spouse name: Not on file  . Number of children: Not on file  . Years  of education: Not on file  . Highest education level: Not on file  Occupational History  . Occupation: works at [employer]  . Financial resource strain: Not on file  . Food insecurity:    Worry: Not on file    Inability: Not on file  . Transportation needs:    Medical: Not on file    Non-medical: Not on file  Tobacco Use  . Smoking status: Former Smoker    Packs/day: 1.00    Years: 10.00    Pack years: 10.00    Types: Cigarettes    Last attempt to quit: 01/08/1991    Years since quitting: 26.9  . Smokeless tobacco: Never Used  Substance and Sexual Activity  . Alcohol use: Yes    Comment: 06/05/11 "drank enough when I was younger to last me my whole life; don't remember when I had my last drink, maybe 2012"  . Drug use: Not Currently     Types: Oxycodone, Marijuana    Comment: 06/05/11 "pot head in my younger days; 2009 dr had me on oxycodone and valium for almost 41yr"  . Sexual activity: Not Currently  Lifestyle  . Physical activity:    Days per week: Not on file    Minutes per session: Not on file  . Stress: To some extent  Relationships  . Social connections:    Talks on phone: Not on file    Gets together: Not on file    Attends religious service: Not on file    Active member of club or organization: Not on file    Attends meetings of clubs or organizations: Not on file    Relationship status: Not on file  Other Topics Concern  . Not on file  Social History Narrative  . Not on file   Family History  Problem Relation Age of Onset  . Heart disease Father   . Heart disease Mother   . Cancer Mother   . Cancer Sister   . Heart disease Brother    Scheduled Meds: . aspirin EC  81 mg Oral Daily  . celecoxib  200 mg Oral QHS  . enoxaparin (LOVENOX) injection  40 mg Subcutaneous Q24H  . gabapentin  400 mg Oral Daily   And  . gabapentin  800 mg Oral QHS  . lactose free nutrition  237 mL Oral TID WC  . loratadine  10 mg Oral Daily  . multivitamin with minerals  1 tablet Oral Daily  . polyethylene glycol  17 g Oral BID  . senna-docusate  1 tablet Oral BID  . sodium chloride flush  3 mL Intravenous Q12H  . sodium chloride flush  5 mL Intracatheter Q8H  . tamsulosin  0.4 mg Oral QPC supper   Continuous Infusions: . sodium chloride 50 mL/hr at 12/25/17 1912  . small volume/piggyback builder 800 mg (12/25/17 1913)  . [START ON 12/26/2017] fluconazole (DIFLUCAN) IV    . meropenem (MERREM) IV 1 g (12/25/17 1826)   PRN Meds:.acetaminophen, albuterol, ipratropium, ondansetron **OR** ondansetron (ZOFRAN) IV, oxyCODONE, sorbitol, traMADol Medications Prior to Admission:  Prior to Admission medications   Medication Sig Start Date End Date Taking? Authorizing Provider  aspirin EC 81 MG tablet Take 81 mg by mouth  daily.   Yes [provider]  atorvastatin (LIPITOR) 40 MG tablet Take 1 tablet (40 mg total) by mouth daily at 6 PM. Patient taking differently: Take 20 mg by mouth daily at 6 PM.  12/02/12  Yes  Viyuoh, Adeline C, MD  calcium carbonate (TUMS - DOSED IN MG ELEMENTAL CALCIUM) 500 MG chewable tablet Chew 1 tablet by mouth 2 (two) times daily as needed for indigestion or heartburn.   Yes [provider]  celecoxib (CELEBREX) 200 MG capsule Take 1 capsule by mouth daily. 10/10/17  Yes [provider]  empagliflozin (JARDIANCE) 10 MG TABS tablet Take 10 mg by mouth at bedtime.    Yes [provider]  Fexofenadine-Pseudoephedrine (ALLEGRA-D 24 HOUR PO) Take by mouth daily. WAL/FEX   Yes [provider]  gabapentin (NEURONTIN) 400 MG capsule Take 400-800 mg by mouth 2 (two) times daily. 1 capsule am, 2 capsules pm   Yes [provider]  glipiZIDE (GLUCOTROL) 5 MG tablet Take 5 mg by mouth 2 (two) times daily before a meal.   Yes [provider]  Insulin Detemir (LEVEMIR FLEXPEN) 100 UNIT/ML Pen Inject 10-60 Units into the skin 2 (two) times daily. Take 62 units in the am and Take 10 units in the pm   Yes [provider]  lisinopril (PRINIVIL,ZESTRIL) 20 MG tablet Take 20 mg by mouth daily.   Yes [provider]  metFORMIN (GLUCOPHAGE) 1000 MG tablet Take 1,000 mg by mouth 2 (two) times daily with a meal.   Yes [provider]  Multiple Vitamin (MULTIVITAMIN) tablet Take 1 tablet by mouth daily.   Yes [provider]  ondansetron (ZOFRAN) 8 MG tablet Take 1 tablet (8 mg total) by mouth 2 (two) times daily as needed. Start on the third day after chemotherapy. 11/06/17  Yes [name], MD  oxyCODONE 10 MG TABS Take 1 tablet (10 mg total) by mouth every 4 (four) hours as needed for severe pain. 10/29/17  Yes Nita Sells, MD  polyvinyl alcohol (LIQUIFILM TEARS) 1.4 % ophthalmic solution 1 drop as needed for  dry eyes.   Yes [provider]  prochlorperazine (COMPAZINE) 10 MG tablet Take 1 tablet (10 mg total) by mouth every 6 (six) hours as needed (Nausea or vomiting). 11/06/17  Yes [name], MD  ALPRAZolam Duanne Moron) 0.5 MG tablet Take 1 tablet 1 hour before PET scan Patient not taking: Reported on 11/08/2017 11/07/17   Alla Feeling, NP   Allergies  Allergen Reactions  . Morphine And Related Other (See Comments)    Patient is recovering from drug addiction and wants to avoid any narcotics   Review of Systems Denies pain denies nausea eating well and having bowel movements does not complain of any symptom burden Physical Exam Ill appearing gentleman lying in bed S1-S2 Clear regular breath sounds Abdomen is soft mildly distended, he has a biliary drain Awake alert oriented non focal No edema  Vital Signs: BP 115/73   Pulse 97   Temp 97.9 F (36.6 C) (Oral)   Resp 16   Ht 5\' 11"  (1.803 m)   Wt 95.3 kg   SpO2 100%   BMI 29.29 kg/m  Pain Scale: 0-10 POSS *See Group Information*: 1-Acceptable,Awake and alert Pain Score: 7    SpO2: SpO2: 100 % O2 Device:SpO2: 100 % O2 Flow Rate: .O2 Flow Rate (L/min): 2 L/min  IO: Intake/output summary:   Intake/Output Summary (Last 24 hours) at 12/25/2017 1937 Last data filed at 12/25/2017 1829 Gross per 24 hour  Intake 3943.75 ml  Output 3800 ml  Net 143.75 ml    LBM: Last BM Date: 12/25/17 Baseline Weight: Weight: 95.3 kg Most recent weight: Weight: 95.3 kg     Palliative Assessment/Data:  Flowsheet Rows     Most Recent Value  Intake Tab  Referral Department  Hospitalist  Unit at Time of Referral  Oncology Unit  Palliative Care Primary Diagnosis  Cancer  Date Notified  12/24/17  Palliative Care Type  Return patient Palliative Care  Reason for referral  Clarify Goals of Care  Date of Admission  12/23/17  # of days IP prior to Palliative referral  1  Clinical Assessment  Psychosocial & Spiritual Assessment    Palliative Care Outcomes    palliative performance scale 40%  Time In:  1400 Time Out:  1500 Time Total:  60 Greater than 50%  of this time was spent counseling and coordinating care related to the above assessment and plan.  Signed by: Micheline Rough, MD Please contact Palliative Medicine Team phone at [phone] for questions and concerns.  For individual provider: See Shea Evans

## 2017-12-26 LAB — COMPREHENSIVE METABOLIC PANEL
ALT: 56 U/L — ABNORMAL HIGH (ref 0–44)
AST: 40 U/L (ref 15–41)
Albumin: 2.2 g/dL — ABNORMAL LOW (ref 3.5–5.0)
Alkaline Phosphatase: 225 U/L — ABNORMAL HIGH (ref 38–126)
Anion gap: 13 (ref 5–15)
BUN: 13 mg/dL (ref 8–23)
CHLORIDE: 101 mmol/L (ref 98–111)
CO2: 19 mmol/L — ABNORMAL LOW (ref 22–32)
Calcium: 8.2 mg/dL — ABNORMAL LOW (ref 8.9–10.3)
Creatinine, Ser: 0.55 mg/dL — ABNORMAL LOW (ref 0.61–1.24)
GFR calc Af Amer: >60 mL/min (ref >60)
GFR calc non Af Amer: >60 mL/min (ref >60)
Glucose, Bld: 124 mg/dL — ABNORMAL HIGH (ref 70–99)
Potassium: 3.6 mmol/L (ref 3.5–5.1)
Sodium: 133 mmol/L — ABNORMAL LOW (ref 135–145)
Total Bilirubin: 3.7 mg/dL — ABNORMAL HIGH (ref 0.3–1.2)
Total Protein: 6.4 g/dL — ABNORMAL LOW (ref 6.5–8.1)

## 2017-12-26 LAB — CBC WITH DIFFERENTIAL/PLATELET
Abs Immature Granulocytes: 0.03 10*3/uL (ref 0.00–0.07)
BASOS PCT: 0 %
Basophils Absolute: 0 10*3/uL (ref 0.0–0.1)
Eosinophils Absolute: 0.2 10*3/uL (ref 0.0–0.5)
Eosinophils Relative: 4 %
HCT: 23.2 % — ABNORMAL LOW (ref 39.0–52.0)
Hemoglobin: 7.3 g/dL — ABNORMAL LOW (ref 13.0–17.0)
Immature Granulocytes: 1 %
Lymphocytes Relative: 21 %
Lymphs Abs: 1 10*3/uL (ref 0.7–4.0)
MCH: 29.4 pg (ref 26.0–34.0)
MCHC: 31.5 g/dL (ref 30.0–36.0)
MCV: 93.5 fL (ref 80.0–100.0)
MONOS PCT: 12 %
Monocytes Absolute: 0.6 10*3/uL (ref 0.1–1.0)
Neutro Abs: 3 10*3/uL (ref 1.7–7.7)
Neutrophils Relative %: 62 %
PLATELETS: 99 10*3/uL — AB (ref 150–400)
RBC: 2.48 MIL/uL — ABNORMAL LOW (ref 4.22–5.81)
RDW: 18 % — ABNORMAL HIGH (ref 11.5–15.5)
WBC: 4.8 10*3/uL (ref 4.0–10.5)
nRBC: 0 % (ref 0.0–0.2)

## 2017-12-26 LAB — GLUCOSE, CAPILLARY
Glucose-Capillary: 108 mg/dL — ABNORMAL HIGH (ref 70–99)
Glucose-Capillary: 185 mg/dL — ABNORMAL HIGH (ref 70–99)
Glucose-Capillary: 129 mg/dL — ABNORMAL HIGH (ref 70–99)

## 2017-12-26 LAB — MAGNESIUM: Magnesium: 1.6 mg/dL — ABNORMAL LOW (ref 1.7–2.4)

## 2017-12-26 MED ORDER — ENSURE MAX PROTEIN PO LIQD
11.0000 [oz_av] | Freq: Two times a day (BID) | ORAL | Status: DC
  Administered 2017-12-27 – 2017-12-30 (×7): 11 [oz_av] via ORAL
  Filled 2017-12-26 (×11): qty 330

## 2017-12-26 MED ORDER — MAGNESIUM SULFATE 2 GM/50ML IV SOLN
2.0000 g | Freq: Once | INTRAVENOUS | Status: AC
  Administered 2017-12-26: 2 g via INTRAVENOUS
  Filled 2017-12-26: qty 50

## 2017-12-26 MED ORDER — OXYCODONE HCL 5 MG PO TABS: 5.0000 mg | ORAL_TABLET | Freq: Four times a day (QID) | ORAL | Status: DC | PRN

## 2017-12-26 MED ORDER — TRAMADOL HCL 50 MG PO TABS
100.0000 mg | ORAL_TABLET | Freq: Four times a day (QID) | ORAL | Status: DC | PRN
  Administered 2017-12-26 – 2017-12-29 (×4): 100 mg via ORAL
  Filled 2017-12-26 (×4): qty 2

## 2017-12-26 MED ORDER — ENSURE MAX PROTEIN PO LIQD
11.0000 [oz_av] | Freq: Two times a day (BID) | ORAL | Status: DC
  Filled 2017-12-26: qty 330

## 2017-12-26 MED ORDER — IPRATROPIUM-ALBUTEROL 0.5-2.5 (3) MG/3ML IN SOLN: 3.0000 mL | RESPIRATORY_TRACT | Status: DC | PRN

## 2017-12-26 MED ORDER — GLUCERNA SHAKE PO LIQD
237.0000 mL | Freq: Three times a day (TID) | ORAL | Status: DC
  Administered 2017-12-27 – 2017-12-28 (×2): 237 mL via ORAL
  Filled 2017-12-26 (×11): qty 237

## 2017-12-26 MED FILL — Fluconazole in NaCl 0.9% Inj 400 MG/200ML: INTRAVENOUS | Qty: 400 | Status: AC

## 2017-12-26 NOTE — Progress Notes (Signed)
Patient ID: Matthew Lewis, male   DOB: [birth year], 62 y.o.   MRN: [id number]  PROGRESS NOTE    JESSUP OGAS  CZY:[id number] DOB: [birth year] DOA: 12/22/2017 PCP: [name], MD   Brief Narrative:  62 year old male with history of intrahepatic cholangiocarcinoma with metastasis to the liver and lung on palliative chemotherapy, prior history of alcohol abuse with cirrhosis, status post percutaneous biliary drainage for biliary obstruction, recent hospitalization from December 03, 2017 to December 11, 2017 for septic shock secondary to ESBL E. coli bacteremia status post 2 weeks of IV antibiotics presented on 12/22/2017 with inability to urinate and constipation.  Patient was dehydrated on admission.  Oncology recommended IR evaluation for increasing bilirubin levels.  Patient underwent IR guided percutaneous biliary drain replacement on 12/23/2017.   Assessment & Plan:   Principal Problem:   Dehydration Active Problems:   Hypertension   Hyperlipemia   Diabetes mellitus ([place])   CAD (coronary artery disease)- PCI 2001- no evaluation since   Moderate protein-calorie malnutrition (HCC)   Alcoholic cirrhosis of liver without ascites (HCC)   Biliary obstruction due to malignant neoplasm (HCC)   Transaminitis   Malignant neoplasm metastatic to adrenal gland (HCC)   Intrahepatic cholangiocarcinoma (HCC)   Cholangiocarcinoma (HCC)   Bladder outlet obstruction   Constipation   E. coli bacteremia  -Patient had temperature spike on 12/23/2017 and blood culture is positive for E. coli.  Patient had recent ESBL E. coli bacteremia which was treated with IV antibiotics -Susceptibilities pending.  Continue meropenem.  Follow repeat blood cultures from today.  If blood cultures persistently positive, Port-A-Cath infection might be a possibility as well. -Bile culture is positive for yeast.  Diflucan also started.  Follow LFTs  Dehydration  -Treated with IV fluids.  Improved.   Tolerating diet.  Constipation -Improving.  Continue current bowel regimen  Urinary retention status post Foley catheter placement -Foley catheter was removed on 12/24/2017.  Patient is urinating well as per the patient.  Flomax added.  Might need outpatient urology evaluation.   Intrahepatic cholangiocarcinoma -On palliative chemotherapy.  Oncology following. -Overall prognosis is poor with recurrent bacteremia.  Palliative care evaluation pending.  Moderate malnutrition -Follow nutrition recommendations  Chronic anemia from anemia of chronic disease -Hemoglobin stable.  Transfuse if less than 7  Hypokalemia -Improved.  Repeat a.m. labs  Hypomagnesemia -Replace.  Repeat a.m. labs  History of hypertension -Blood pressure stable.  Currently on the lower side.  Monitor  Diabetes mellitus type 2  -A1c was 4.9 in November 2019.  Continue CBGs with SSI  Alcoholic liver cirrhosis  -Monitor.  No signs of worsening ascites   DVT prophylaxis: Lovenox Code Status: Full Family Communication: No family at bedside Disposition Plan: Home in the 1 to 3 days days if clinically stable and if repeat blood cultures are negative  Consultants: IR/oncology/palliative care  Procedures: Percutaneous biliary drain placement by IR on 12/23/2017 Antimicrobials: Meropenem 1 dose on 12/23/2017.  Meropenem continued from 12/24/2017 onwards  Subjective: Patient seen and examined at bedside.  Denies any worsening abdominal pain, fever, nausea or vomiting.  Tolerating diet.  Having bowel movements.  Objective: Vitals:   12/25/17 0600 12/25/17 1350 12/25/17 2003 12/26/17 0523  BP: (!) 113/59 115/73 125/72 101/69  Pulse: 82 97 (!) 101 85  Resp: 16  16 14   Temp: (!) 97.4 F (36.3 C) 97.9 F (36.6 C) 99.8 F (37.7 C) 97.6 F (36.4 C)  TempSrc: Oral Oral Oral Oral  SpO2: 100% 100% 100% 100%  Weight:      Height:        Intake/Output Summary (Last 24 hours) at 12/26/2017 1042 Last data  filed at 12/26/2017 1015 Gross per 24 hour  Intake 2329.06 ml  Output 2685 ml  Net -355.94 ml   Filed Weights   12/22/17 0818  Weight: 95.3 kg    Examination:  General exam: No acute distress.  Looks older than stated age Respiratory system: Bilateral decreased breath sounds at bases Cardiovascular system: Rate controlled, S1-S2 heard Gastrointestinal system: Abdomen is nondistended, soft and mildly tender around the drain site. Normal bowel sounds heard. Extremities: No cyanosis, edema   Data Reviewed: I have personally reviewed following labs and imaging studies  CBC: Recent Labs  Lab 12/22/17 1004 12/22/17 1146 12/23/17 0452 12/24/17 0636 12/25/17 0609 12/26/17 0500  WBC 6.5  --  5.4 5.2 5.5 4.8  NEUTROABS 5.0  --   --  4.6 4.0 3.0  HGB 9.4* 9.5* 8.5* 7.8* 7.4* 7.3*  HCT 30.1* 28.0* 27.7* 24.6* 23.9* 23.2*  MCV 94.1  --  93.0 93.9 93.7 93.5  PLT 300  --  256 131* 122* 99*   Basic Metabolic Panel: Recent Labs  Lab 12/22/17 1004 12/22/17 1146 12/23/17 0452 12/24/17 0636 12/25/17 0609 12/26/17 0500  NA 132* 135 131* 127* 133* 133*  K 4.0 4.2 3.9 3.8 3.3* 3.6  CL 98 102 102 99 105 101  CO2 26  --  23 18* 21* 19*  GLUCOSE 163* 144* 178* 149* 165* 124*  BUN 19 16 13 23 22 13   CREATININE 0.54* 0.40* 0.52* 1.25* 0.63 0.55*  CALCIUM 9.0  --  8.5* 7.8* 8.4* 8.2*  MG 1.9  --   --   --  2.0 1.6*   GFR: Estimated Creatinine Clearance: 109.9 mL/min (A) (by C-G formula based on SCr of 0.55 mg/dL (L)). Liver Function Tests: Recent Labs  Lab 12/22/17 1004 12/23/17 0452 12/24/17 0636 12/25/17 0609 12/26/17 0500  AST 120* 86* 65* 46* 40  ALT 143* 112* 80* 70* 56*  ALKPHOS 525* 419* 305* 239* 225*  BILITOT 4.8* 3.2* 5.6* 4.3* 3.7*  PROT 8.0 6.9 6.4* 6.4* 6.4*  ALBUMIN 2.9* 2.5* 2.2* 2.3* 2.2*   Recent Labs  Lab 12/22/17 1004  LIPASE 26   No results for input(s): AMMONIA in the last 168 hours. Coagulation Profile: Recent Labs  Lab 12/22/17 1004  12/23/17 0452  INR 1.15 1.18   Cardiac Enzymes: No results for input(s): CKTOTAL, CKMB, CKMBINDEX, TROPONINI in the last 168 hours. BNP (last 3 results) No results for input(s): PROBNP in the last 8760 hours. HbA1C: No results for input(s): HGBA1C in the last 72 hours. CBG: Recent Labs  Lab 12/22/17 2209 12/25/17 2002 12/26/17 0803  GLUCAP 176* 245* 108*   Lipid Profile: No results for input(s): CHOL, HDL, LDLCALC, TRIG, CHOLHDL, LDLDIRECT in the last 72 hours. Thyroid Function Tests: No results for input(s): TSH, T4TOTAL, FREET4, T3FREE, THYROIDAB in the last 72 hours. Anemia Panel: No results for input(s): VITAMINB12, FOLATE, FERRITIN, TIBC, IRON, RETICCTPCT in the last 72 hours. Sepsis Labs: No results for input(s): PROCALCITON, LATICACIDVEN in the last 168 hours.  Recent Results (from the past 240 hour(s))  Culture, Urine     Status: None   Collection Time: 12/22/17  5:26 PM  Result Value Ref Range Status   Specimen Description   Final    URINE, RANDOM Performed at [facility] [address] [place], [place] [address]    Special  Requests   Final    NONE Performed at [facility], [place] [address] [place], [place] [address]    Culture   Final    NO GROWTH Performed at [facility], [place] [address], [place], [place] [address]    Report Status 12/24/2017 FINAL  Final  Culture, blood (Routine X 2) w Reflex to ID Panel     Status: None (Preliminary result)   Collection Time: 12/23/17  4:11 PM  Result Value Ref Range Status   Specimen Description   Final    BLOOD LEFT ARM Performed at [facility] [address] [place], [place] [address]    Special Requests   Final    BOTTLES DRAWN AEROBIC AND ANAEROBIC Blood Culture adequate volume Performed at [facility] [address] [place], [place] [address]    Culture  Setup Time   Final    GRAM NEGATIVE RODS AEROBIC BOTTLE  ONLY CRITICAL RESULT CALLED TO, READ BACK BY AND VERIFIED WITH: E. WILLIAMSON, PHARMD ([facility]) AT 0915 ON 12/24/17 BY C. JESSUP, MLT.    Culture   Final    GRAM NEGATIVE RODS CULTURE REINCUBATED FOR BETTER GROWTH Performed at [facility], [place] [address], [place], [place] [address]    Report Status PENDING  Incomplete  Culture, blood (Routine X 2) w Reflex to ID Panel     Status: None (Preliminary result)   Collection Time: 12/23/17  4:12 PM  Result Value Ref Range Status   Specimen Description   Final    BLOOD RIGHT ARM Performed at [facility] [address], [place], [place] [address]    Special Requests   Final    BOTTLES DRAWN AEROBIC AND ANAEROBIC Blood Culture results may not be optimal due to an inadequate volume of blood received in culture bottles Performed at [facility] [address], [place], [place] [address]    Culture  Setup Time   Final    GRAM NEGATIVE RODS AEROBIC BOTTLE ONLY CRITICAL RESULT CALLED TO, READ BACK BY AND VERIFIED WITH: E. WILLIAMSON, PHARMD ([facility]) AT 0915 ON 12/24/17 BY C. JESSUP, MLT.    Culture   Final    GRAM NEGATIVE RODS CULTURE REINCUBATED FOR BETTER GROWTH Performed at [facility], [place] [address] [place], [place] [address]    Report Status PENDING  Incomplete  Blood Culture ID Panel (Reflexed)     Status: Abnormal   Collection Time: 12/23/17  4:12 PM  Result Value Ref Range Status   Enterococcus species NOT DETECTED NOT DETECTED Final   Listeria monocytogenes NOT DETECTED NOT DETECTED Final   Staphylococcus species NOT DETECTED NOT DETECTED Final   Staphylococcus aureus (BCID) NOT DETECTED NOT DETECTED Final   Streptococcus species NOT DETECTED NOT DETECTED Final   Streptococcus agalactiae NOT DETECTED NOT DETECTED Final   Streptococcus pneumoniae NOT DETECTED NOT DETECTED Final   Streptococcus pyogenes NOT DETECTED NOT DETECTED Final   Acinetobacter baumannii NOT DETECTED NOT  DETECTED Final   Enterobacteriaceae species DETECTED (A) NOT DETECTED Final    Comment: Enterobacteriaceae represent a large family of gram-negative bacteria, not a single organism. CRITICAL RESULT CALLED TO, READ BACK BY AND VERIFIED WITH: E. WILLIAMSON, PHARMD ([facility]) AT 0915 ON 12/24/17 BY C. JESSUP, MLT.    Enterobacter cloacae complex NOT DETECTED NOT DETECTED Final   Escherichia coli DETECTED (A) NOT DETECTED Final    Comment: CRITICAL RESULT CALLED TO, READ BACK  BY AND VERIFIED WITH: E. WILLIAMSON, PHARMD ([facility]) AT 0915 ON 12/24/17 BY C. JESSUP, MLT.    Klebsiella oxytoca NOT DETECTED NOT DETECTED Final   Klebsiella pneumoniae NOT DETECTED NOT DETECTED Final   Proteus species NOT DETECTED NOT DETECTED Final   Serratia marcescens NOT DETECTED NOT DETECTED Final   Carbapenem resistance NOT DETECTED NOT DETECTED Final   Haemophilus influenzae NOT DETECTED NOT DETECTED Final   Neisseria meningitidis NOT DETECTED NOT DETECTED Final   Pseudomonas aeruginosa NOT DETECTED NOT DETECTED Final   Candida albicans NOT DETECTED NOT DETECTED Final   Candida glabrata NOT DETECTED NOT DETECTED Final   Candida krusei NOT DETECTED NOT DETECTED Final   Candida parapsilosis NOT DETECTED NOT DETECTED Final   Candida tropicalis NOT DETECTED NOT DETECTED Final    Comment: Performed at [facility], [place] [address], [place], [place] [address]  Body fluid culture     Status: None (Preliminary result)   Collection Time: 12/23/17 11:52 PM  Result Value Ref Range Status   Specimen Description   Final    BILE Performed at [facility] [address], [place], [place] [address]    Special Requests   Final    Normal Performed at [facility], [place] [address] [place], [place] [address]    Gram Stain   Final    MODERATE WBC PRESENT, PREDOMINANTLY PMN RARE GRAM POSITIVE COCCI RARE GRAM NEGATIVE RODS RARE YEAST    Culture   Final    CULTURE REINCUBATED FOR BETTER  GROWTH Performed at [facility], [place] [address], [place], [place] [address]    Report Status PENDING  Incomplete         Radiology Studies: No results found.      Scheduled Meds: . aspirin EC  81 mg Oral Daily  . celecoxib  200 mg Oral QHS  . enoxaparin (LOVENOX) injection  40 mg Subcutaneous Q24H  . gabapentin  400 mg Oral Daily   And  . gabapentin  800 mg Oral QHS  . lactose free nutrition  237 mL Oral TID WC  . loratadine  10 mg Oral Daily  . multivitamin with minerals  1 tablet Oral Daily  . polyethylene glycol  17 g Oral BID  . senna-docusate  1 tablet Oral BID  . sodium chloride flush  3 mL Intravenous Q12H  . sodium chloride flush  5 mL Intracatheter Q8H  . tamsulosin  0.4 mg Oral QPC supper   Continuous Infusions: . sodium chloride 50 mL/hr at 12/25/17 1912  . fluconazole (DIFLUCAN) IV    . meropenem (MERREM) IV 1 g (12/26/17 0236)     LOS: 3 days        Aline August, MD [facility] Pager [phone]  If 7PM-7AM, please contact night-coverage [email or web address] Password [facility] 12/26/2017, 10:42 AM

## 2017-12-26 NOTE — Progress Notes (Signed)
Referring Physician(s): Levin Erp; Eugenie Filler  Supervising Physician: Daryll Brod  Patient Status:  [facility] - In-pt  Chief Complaint: Follow up right and left biliary drains  Subjective:  Patient with history of metastatic cholangiocarcinoma s/p right biliary drain placement 10/26/17 by Dr. Dannette Barbara, exchanged 11/07/17, capped 12/09/17, uncapped 12/22/17 due to worsening bilirubin; left biliary drain placed 12/23/17 by Dr. Laurence Ferrari.   Patient sitting up in bed eating breakfast, states he feels very good - is anticipating going home soon. He denies any pain, nausea or vomiting.  Allergies: Morphine and related  Medications: Prior to Admission medications   Medication Sig Start Date End Date Taking? Authorizing Provider  aspirin EC 81 MG tablet Take 81 mg by mouth daily.   Yes [provider]  calcium carbonate (TUMS - DOSED IN MG ELEMENTAL CALCIUM) 500 MG chewable tablet Chew 1 tablet by mouth 2 (two) times daily as needed for indigestion or heartburn.   Yes [provider]  celecoxib (CELEBREX) 200 MG capsule Take 200 mg by mouth at bedtime.  10/10/17  Yes [provider]  fexofenadine (ALLEGRA) 60 MG tablet Take 60 mg by mouth 2 (two) times daily.   Yes [provider]  gabapentin (NEURONTIN) 400 MG capsule Take 400-800 mg by mouth See admin instructions. Take 400 mg by mouth in the morning and 800 mg in the evening   Yes [provider]  lactose free nutrition (BOOST PLUS) LIQD Take 237 mLs by mouth 3 (three) times daily with meals. 12/11/17  Yes Regalado, Belkys A, MD  Multiple Vitamin (MULTIVITAMIN) tablet Take 1 tablet by mouth daily.   Yes [provider]  ondansetron (ZOFRAN) 8 MG tablet Take 1 tablet (8 mg total) by mouth 2 (two) times daily as needed. Start on the third day after chemotherapy. 11/06/17  Yes [name], MD  prochlorperazine (COMPAZINE) 10 MG tablet Take 1 tablet (10 mg total) by mouth every  6 (six) hours as needed (Nausea or vomiting). Patient not taking: Reported on 12/22/2017 11/06/17   [name], MD     Vital Signs: BP 101/69 (BP Location: Left Arm)   Pulse 85   Temp 97.6 F (36.4 C) (Oral)   Resp 14   Ht 5\' 11"  (1.803 m)   Wt 210 lb (95.3 kg)   SpO2 100%   BMI 29.29 kg/m   Physical Exam Vitals signs and nursing note reviewed.  Constitutional:      General: He is not in acute distress.    Appearance: Normal appearance.  HENT:     Head: Normocephalic and atraumatic.  Eyes:     General: No scleral icterus. Cardiovascular:     Rate and Rhythm: Normal rate and regular rhythm.  Pulmonary:     Effort: Pulmonary effort is normal.     Breath sounds: Normal breath sounds.  Abdominal:     Tenderness: There is no abdominal tenderness.     Comments: Right and left biliary drains both to gravity, bags fastened to right lower extremity. Both drains with clear bilious OP; both insertion sites are clean, dry, intact.  Skin:    General: Skin is warm and dry.  Neurological:     Mental Status: He is alert. Mental status is at baseline.     Imaging: Ct Abdomen Pelvis W Contrast  Result Date: 12/22/2017 CLINICAL DATA:  Intrahepatic cholangiocarcinoma, stage IV, with adrenal and retroperitoneal nodal metastases, diagnosed October 2019. Status post internal external percutaneous biliary drainage. Abdominal pain.  Unable to micturate. Constipation. EXAM: CT ABDOMEN AND PELVIS WITH CONTRAST TECHNIQUE: Multidetector CT imaging of the abdomen and pelvis was performed using the standard protocol following bolus administration of intravenous contrast. CONTRAST:  152mL ISOVUE-300 IOPAMIDOL (ISOVUE-300) INJECTION 61% COMPARISON:  12/03/2017 CT abdomen/pelvis. FINDINGS: Lower chest: Solid irregular 3.0 cm left lower lobe lung mass (series 6/image 38), stable. Bandlike consolidation in the basilar right lower lobe base slightly decreased, favoring evolving postinfectious/postinflammatory  scarring. Coronary atherosclerosis. Tip of right sided Port-A-Cath is seen at the cavoatrial junction. Enlarged 1.8 cm right infrahilar node is stable since 10/27/2017 chest CT. Hepatobiliary: Liver surface is diffusely irregular, compatible with hepatic cirrhosis. Ill-defined infiltrative 5.8 x 4.8 cm hypoenhancing central left liver lobe mass (series 2/image 19), previously 5.5 x 5.0 cm on 12/03/2017 CT using similar measurement technique, not appreciably changed. Stable moderate intrahepatic biliary ductal dilatation throughout the left lower lobe peripheral to this mass. Multiple (at least 5) liver masses scattered throughout the periphery of the liver have not appreciably changed, largest 3.6 cm in the anterior superior liver (series 2/image 13) and 3.5 cm in the far inferior right liver lobe (series 2/image 32). Stable position of percutaneous internal external biliary drain traversing the right liver lobe and terminating within the descending duodenal lumen, with no significant intrahepatic biliary ductal dilatation in the right liver lobe. Expected pneumobilia within the nondependent gallbladder, which otherwise appears normal with no radiopaque cholelithiasis. Normal caliber common bile duct. Pancreas: Normal, with no mass or duct dilation. Spleen: Stable moderate splenomegaly with craniocaudal splenic length 17.0 cm. No splenic mass. Adrenals/Urinary Tract: Stable bilateral adrenal masses measuring 5.1 cm on the right and 2.5 cm on the left. No hydronephrosis. Nonobstructing 2 mm lower left renal stone. No renal masses. Prominently distended and otherwise normal urinary bladder. Stomach/Bowel: Normal non-distended stomach. Normal caliber small bowel with no small bowel wall thickening. Normal appendix. Stable postsurgical changes from partial left colectomy. Large amount of stool throughout remnant colon and rectum. New mild circumferential rectal wall thickening with slight perirectal fat haziness. Rectal  diameter 7.7 cm. No definite rectal pneumatosis or perirectal free air. No additional sites of large bowel wall thickening. Vascular/Lymphatic: Atherosclerotic nonaneurysmal abdominal aorta. Patent main and right portal, splenic, hepatic and renal veins. 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 Stable aortocaval adenopathy up to 1.8 cm (series 2/image 42). Stable enlarged 1.4 cm left para-aortic node (series 2/image 42). Stable enlarged 1.9 cm left mesenteric node (series 2/image 61). No new pathologically enlarged lymph nodes in the abdomen or pelvis. Reproductive: Mildly enlarged prostate. Other: No pneumoperitoneum. No focal fluid collection. Trace perihepatic ascites, decreased. Musculoskeletal: No aggressive appearing focal osseous lesions. Marked thoracolumbar spondylosis with stable bilateral L5 pars defects. IMPRESSION: 1. Prominently distended and otherwise normal urinary bladder with mild prostatomegaly. Findings suggest acute bladder outlet obstruction. No hydronephrosis. 2. Large colorectal stool volume compatible with constipation. New mild circumferential rectal wall thickening and perirectal fat haziness suggesting stercoral colitis. No free air. No abscess. 3. Stable infiltrative cholangiocarcinoma in the central left liver lobe with associated peripheral intrahepatic biliary ductal dilatation throughout the left liver lobe. 4. Stable well-positioned internal external drain traversing the right liver lobe and decompressing the right liver biliary system. 5. Stable liver, bilateral adrenal, retroperitoneal, left mesenteric and right infrahilar nodal and left lung base metastases. No progressive metastatic disease since 12/03/2017 CT. 6. Cirrhosis. Trace perihepatic ascites, decreased. Stable moderate splenomegaly. 7.  Aortic Atherosclerosis (ICD10-I70.0). Electronically Signed   By: Janina Mayo.D.  On: 12/22/2017 15:01   Dg Abd Acute W/chest  Result Date: 12/22/2017 CLINICAL DATA:   Metastatic cancer with constipation EXAM: DG ABDOMEN ACUTE W/ 1V CHEST COMPARISON:  12/03/2017 FINDINGS: Formed stool distends the colon diffusely. No small bowel dilatation or evident pneumoperitoneum. Percutaneous biliary catheter in unremarkable position. Porta catheter with tip at the upper cavoatrial junction. There is no edema, consolidation, effusion, or pneumothorax. Normal heart size IMPRESSION: Generalized stool retention consistent with history of constipation. Electronically Signed   By: Monte Fantasia M.D.   On: 12/22/2017 10:00   Ir Cholangiogram Existing Tube  Result Date: 12/23/2017 INDICATION: 62 year old male with metastatic cholangiocarcinoma, jaundice, elevated bilirubin and new onset high-grade fever concerning for acute cholangitis. He has a previously placed percutaneous biliary drain via the right system. However, on CT imaging he has persistent obstruction of the left biliary system secondary to a central hepatic mass. The left system is currently undrained. Additionally, the right anterior system is poorly drained. Therefore, he presents for placement of a left-sided biliary drain and also a drain injection of the existing right-sided drainage catheter to make sure it is not clogged or malpositioned. EXAM: 1. Percutaneous transhepatic cholangiogram 2. Placement of an internal/external percutaneous biliary drain via the left 3. Cholangiogram through existing catheter via the existing right percutaneous biliary drain MEDICATIONS: 1 g Meropenem; The antibiotic was administered within an appropriate time frame prior to the initiation of the procedure. ANESTHESIA/SEDATION: Moderate (conscious) sedation was employed during this procedure. A total of Versed 4 mg and Fentanyl 25 mcg was administered intravenously. Moderate Sedation Time: 26 minutes. The patient's level of consciousness and vital signs were monitored continuously by radiology nursing throughout the procedure under my direct  supervision. FLUOROSCOPY TIME:  Fluoroscopy Time: 6 minutes 0 seconds (216 mGy). COMPLICATIONS: None immediate. PROCEDURE: Informed written consent was obtained from the patient after a thorough discussion of the procedural risks, benefits and alternatives. All questions were addressed. Maximal Sterile Barrier Technique was utilized including caps, mask, sterile gowns, sterile gloves, sterile drape, hand hygiene and skin antiseptic. A timeout was performed prior to the initiation of the procedure. The mid epigastric region was interrogated with ultrasound. There is biliary ductal dilatation. A suitable peripheral biliary radicle was identified. Local anesthesia was attained by infiltration with 1% lidocaine. A small dermatotomy was made. Under real-time sonographic guidance, the bile duct was punctured using a 22 gauge Chiba needle. There was return of purulent appearing bile. A percutaneous transhepatic cholangiogram was performed. There is diffuse dilatation of the left intrahepatic ducts with complete occlusion of the left main hepatic duct. A Nitrex wire was carefully advanced into the left ductal system. The Chiba needle was removed and the 4 [place] hydrophilic transitional sheath advanced over the wire and positioned in the central left hepatic ducts. A glidewire was then successfully navigated through the occlusion and into the common bile duct. The sheath was advanced of the common bile duct. The glidewire was exchanged for an Amplatz wire which was advanced into the duodenum. Given the relatively long common bile duct and the peripheral access site in the left ductal system, a 10 [place] biliary drain was modified with additional sideholes. The percutaneous tract was then dilated to 10 [place]. The modified 10 [place] biliary drain was then advanced over the wire and formed in the duodenum. The most proximal side hole is at the site of the biliary duct entry. This is visible on the images and marked by the tip  of a Bentson wire. The  catheter was connected to gravity bag drainage and secured to the skin with 0 Prolene suture. Attention was turned to the existing right percutaneous biliary drain. Contrast was injected under fluoroscopy. The drain is patent and functioning normally. No further intervention performed on this side. IMPRESSION: 1. Successful placement of a new left-sided internal/external percutaneous biliary drain. The drain is 10 [place] and modified with additional sideholes given the length of the occluded segment. 2. Cholangiogram through existing tube on the right demonstrates a normally functioning right-sided percutaneous biliary drainage catheter. Electronically Signed   By: Jacqulynn Cadet M.D.   On: 12/23/2017 18:18   Ir Int Lianne Cure Biliary Drain With Cholangiogram  Result Date: 12/23/2017 INDICATION: 62 year old male with metastatic cholangiocarcinoma, jaundice, elevated bilirubin and new onset high-grade fever concerning for acute cholangitis. He has a previously placed percutaneous biliary drain via the right system. However, on CT imaging he has persistent obstruction of the left biliary system secondary to a central hepatic mass. The left system is currently undrained. Additionally, the right anterior system is poorly drained. Therefore, he presents for placement of a left-sided biliary drain and also a drain injection of the existing right-sided drainage catheter to make sure it is not clogged or malpositioned. EXAM: 1. Percutaneous transhepatic cholangiogram 2. Placement of an internal/external percutaneous biliary drain via the left 3. Cholangiogram through existing catheter via the existing right percutaneous biliary drain MEDICATIONS: 1 g Meropenem; The antibiotic was administered within an appropriate time frame prior to the initiation of the procedure. ANESTHESIA/SEDATION: Moderate (conscious) sedation was employed during this procedure. A total of Versed 4 mg and Fentanyl 25 mcg was  administered intravenously. Moderate Sedation Time: 26 minutes. The patient's level of consciousness and vital signs were monitored continuously by radiology nursing throughout the procedure under my direct supervision. FLUOROSCOPY TIME:  Fluoroscopy Time: 6 minutes 0 seconds (216 mGy). COMPLICATIONS: None immediate. PROCEDURE: Informed written consent was obtained from the patient after a thorough discussion of the procedural risks, benefits and alternatives. All questions were addressed. Maximal Sterile Barrier Technique was utilized including caps, mask, sterile gowns, sterile gloves, sterile drape, hand hygiene and skin antiseptic. A timeout was performed prior to the initiation of the procedure. The mid epigastric region was interrogated with ultrasound. There is biliary ductal dilatation. A suitable peripheral biliary radicle was identified. Local anesthesia was attained by infiltration with 1% lidocaine. A small dermatotomy was made. Under real-time sonographic guidance, the bile duct was punctured using a 22 gauge Chiba needle. There was return of purulent appearing bile. A percutaneous transhepatic cholangiogram was performed. There is diffuse dilatation of the left intrahepatic ducts with complete occlusion of the left main hepatic duct. A Nitrex wire was carefully advanced into the left ductal system. The Chiba needle was removed and the 4 [place] hydrophilic transitional sheath advanced over the wire and positioned in the central left hepatic ducts. A glidewire was then successfully navigated through the occlusion and into the common bile duct. The sheath was advanced of the common bile duct. The glidewire was exchanged for an Amplatz wire which was advanced into the duodenum. Given the relatively long common bile duct and the peripheral access site in the left ductal system, a 10 [place] biliary drain was modified with additional sideholes. The percutaneous tract was then dilated to 10 [place]. The  modified 10 [place] biliary drain was then advanced over the wire and formed in the duodenum. The most proximal side hole is at the site of the biliary duct entry. This is  visible on the images and marked by the tip of a Bentson wire. The catheter was connected to gravity bag drainage and secured to the skin with 0 Prolene suture. Attention was turned to the existing right percutaneous biliary drain. Contrast was injected under fluoroscopy. The drain is patent and functioning normally. No further intervention performed on this side. IMPRESSION: 1. Successful placement of a new left-sided internal/external percutaneous biliary drain. The drain is 10 [place] and modified with additional sideholes given the length of the occluded segment. 2. Cholangiogram through existing tube on the right demonstrates a normally functioning right-sided percutaneous biliary drainage catheter. Electronically Signed   By: Jacqulynn Cadet M.D.   On: 12/23/2017 18:18    Labs:  CBC: Recent Labs    12/23/17 0452 12/24/17 0636 12/25/17 0609 12/26/17 0500  WBC 5.4 5.2 5.5 4.8  HGB 8.5* 7.8* 7.4* 7.3*  HCT 27.7* 24.6* 23.9* 23.2*  PLT 256 131* 122* 99*    COAGS: Recent Labs    12/03/17 0953 12/09/17 0613 12/22/17 1004 12/23/17 0452  INR 1.51 1.29 1.15 1.18    BMP: Recent Labs    12/23/17 0452 12/24/17 0636 12/25/17 0609 12/26/17 0500  NA 131* 127* 133* 133*  K 3.9 3.8 3.3* 3.6  CL 102 99 105 101  CO2 23 18* 21* 19*  GLUCOSE 178* 149* 165* 124*  BUN 13 23 22 13   CALCIUM 8.5* 7.8* 8.4* 8.2*  CREATININE 0.52* 1.25* 0.63 0.55*  GFRNONAA >60 >60 >60 >60  GFRAA >60 >60 >60 >60    LIVER FUNCTION TESTS: Recent Labs    12/23/17 0452 12/24/17 0636 12/25/17 0609 12/26/17 0500  BILITOT 3.2* 5.6* 4.3* 3.7*  AST 86* 65* 46* 40  ALT 112* 80* 70* 56*  ALKPHOS 419* 305* 239* 225*  PROT 6.9 6.4* 6.4* 6.4*  ALBUMIN 2.5* 2.2* 2.3* 2.2*    Assessment and Plan:  S/p right biliary drain initially placed  10/26/17 by Dr. Earleen Newport, exchanged 11/07/17, capped 12/09/17, uncapped 12/22/17 due to worsening bilirubin; left biliary drain placed 12/23/17 by Dr. Laurence Ferrari.  Both drains stable, clear bilious OP from both - 650 cc from right and 960 cc from left. Preliminary culture from left biliary drain (+) for e.coli and candida - he continues on fluconazole and meropenem per primary team. Tmax 99.8, WBC 4.8, hgb 7.3, plt 99, t.bili improved at 3.7.  Continue current management. IR will continue to follow along.  Please call with questions or concerns.  Electronically Signed: Joaquim Nam, PA-C 12/26/2017, 9:15 AM   I spent a total of 15 Minutes at the the patient's bedside AND on the patient's hospital floor or unit, greater than 50% of which was counseling/coordinating care for right and left biliary drain follow up.

## 2017-12-26 NOTE — Care Management Important Message (Signed)
Important Message  Patient Details  Name: Matthew Lewis MRN: [id number] Date of Birth: Apr 18, 1955   Medicare Important Message Given:  Yes    Kerin Salen 12/26/2017, 10:29 AMImportant Message  Patient Details  Name: Matthew Lewis MRN: [id number] Date of Birth: 1955-07-09   Medicare Important Message Given:  Yes    Kerin Salen 12/26/2017, 10:29 AM

## 2017-12-27 LAB — BODY FLUID CULTURE: Special Requests: NORMAL

## 2017-12-27 LAB — COMPREHENSIVE METABOLIC PANEL
ALT: 50 U/L — ABNORMAL HIGH (ref 0–44)
AST: 40 U/L (ref 15–41)
Albumin: 2.4 g/dL — ABNORMAL LOW (ref 3.5–5.0)
Alkaline Phosphatase: 249 U/L — ABNORMAL HIGH (ref 38–126)
Anion gap: 9 (ref 5–15)
BUN: 14 mg/dL (ref 8–23)
CO2: 20 mmol/L — ABNORMAL LOW (ref 22–32)
Calcium: 8.5 mg/dL — ABNORMAL LOW (ref 8.9–10.3)
Chloride: 103 mmol/L (ref 98–111)
Creatinine, Ser: 0.56 mg/dL — ABNORMAL LOW (ref 0.61–1.24)
GFR calc Af Amer: >60 mL/min (ref >60)
GFR calc non Af Amer: >60 mL/min (ref >60)
Glucose, Bld: 139 mg/dL — ABNORMAL HIGH (ref 70–99)
POTASSIUM: 3.6 mmol/L (ref 3.5–5.1)
Sodium: 132 mmol/L — ABNORMAL LOW (ref 135–145)
Total Bilirubin: 3.1 mg/dL — ABNORMAL HIGH (ref 0.3–1.2)
Total Protein: 6.5 g/dL (ref 6.5–8.1)

## 2017-12-27 LAB — CBC WITH DIFFERENTIAL/PLATELET
Abs Immature Granulocytes: 0.03 10*3/uL (ref 0.00–0.07)
Basophils Absolute: 0 10*3/uL (ref 0.0–0.1)
Basophils Relative: 0 %
Eosinophils Absolute: 0.2 10*3/uL (ref 0.0–0.5)
Eosinophils Relative: 5 %
HCT: 25.3 % — ABNORMAL LOW (ref 39.0–52.0)
Hemoglobin: 7.9 g/dL — ABNORMAL LOW (ref 13.0–17.0)
Immature Granulocytes: 1 %
Lymphocytes Relative: 23 %
Lymphs Abs: 1 10*3/uL (ref 0.7–4.0)
MCH: 28.6 pg (ref 26.0–34.0)
MCHC: 31.2 g/dL (ref 30.0–36.0)
MCV: 91.7 fL (ref 80.0–100.0)
MONO ABS: 0.6 10*3/uL (ref 0.1–1.0)
Monocytes Relative: 14 %
Neutro Abs: 2.6 10*3/uL (ref 1.7–7.7)
Neutrophils Relative %: 57 %
Platelets: 89 10*3/uL — ABNORMAL LOW (ref 150–400)
RBC: 2.76 MIL/uL — ABNORMAL LOW (ref 4.22–5.81)
RDW: 18 % — ABNORMAL HIGH (ref 11.5–15.5)
WBC: 4.5 10*3/uL (ref 4.0–10.5)
nRBC: 0 % (ref 0.0–0.2)

## 2017-12-27 LAB — MAGNESIUM: Magnesium: 1.8 mg/dL (ref 1.7–2.4)

## 2017-12-27 LAB — CULTURE, BLOOD (ROUTINE X 2): Special Requests: ADEQUATE

## 2017-12-27 NOTE — Progress Notes (Signed)
Patient ID: Matthew Lewis, male   DOB: 1955-12-14, 62 y.o.   MRN: [id number]  PROGRESS NOTE    Matthew Lewis  FHL:[id number] DOB: [birth year] DOA: 12/22/2017 PCP: [name], MD   Brief Narrative:  62 year old male with history of intrahepatic cholangiocarcinoma with metastasis to the liver and lung on palliative chemotherapy, prior history of alcohol abuse with cirrhosis, status post percutaneous biliary drainage for biliary obstruction, recent hospitalization from December 03, 2017 to December 11, 2017 for septic shock secondary to ESBL E. coli bacteremia status post 2 weeks of IV antibiotics presented on 12/22/2017 with inability to urinate and constipation.  Patient was dehydrated on admission.  Oncology recommended IR evaluation for increasing bilirubin levels.  Patient underwent IR guided percutaneous biliary drain replacement on 12/23/2017.   Assessment & Plan:   Principal Problem:   Dehydration Active Problems:   Hypertension   Hyperlipemia   Diabetes mellitus ([place])   CAD (coronary artery disease)- PCI 2001- no evaluation since   Moderate protein-calorie malnutrition (HCC)   Alcoholic cirrhosis of liver without ascites (HCC)   Biliary obstruction due to malignant neoplasm (HCC)   Transaminitis   Malignant neoplasm metastatic to adrenal gland (HCC)   Intrahepatic cholangiocarcinoma (HCC)   Cholangiocarcinoma (HCC)   Bladder outlet obstruction   Constipation   E. coli bacteremia  -Patient had temperature spike on 12/23/2017 and blood culture is positive for E. coli.  Patient had recent ESBL E. coli bacteremia which was treated with IV antibiotics -Susceptibilities pending.  Continue meropenem.  Follow repeat blood cultures from today.  If blood cultures persistently positive, Port-A-Cath infection might be a possibility as well. -Bile culture is positive for yeast.  Diflucan also started.  Follow LFTs 12/27/2017: Follow repeat cultures.  Complete course of  antibiotics.  Dehydration  -Treated with IV fluids.  Improved.  Tolerating diet.  Constipation -Improving.  Continue current bowel regimen  Urinary retention status post Foley catheter placement -Foley catheter was removed on 12/24/2017.  Patient is urinating well as per the patient.  Flomax added.  Might need outpatient urology evaluation. 12/27/2017: Continue to monitor.  Intrahepatic cholangiocarcinoma -On palliative chemotherapy.  Oncology following. -Overall prognosis is poor with recurrent bacteremia.  Palliative care evaluation pending.  Moderate malnutrition -Follow nutrition recommendations  Chronic anemia from anemia of chronic disease -Hemoglobin stable.  Transfuse if less than 7  Hypokalemia -Improved.  Repeat a.m. labs 12/27/2017: Potassium is 3.6 today.  Mild dyspnea is 1.8.  Continue to monitor closely.  Hypomagnesemia -Replace.  Repeat a.m. labs 12/27/2017: Magnesium is 1.8 today.  Continue to monitor closely.  History of hypertension -Blood pressure stable.  Currently on the lower side.  Monitor  Diabetes mellitus type 2  -A1c was 4.9 in November 2019.  Continue CBGs with SSI  Alcoholic liver cirrhosis  -Monitor.  No signs of worsening ascites   DVT prophylaxis: Lovenox Code Status: Full Family Communication: No family at bedside Disposition Plan: Home in the 1 to 3 days days if clinically stable and if repeat blood cultures are negative  Consultants: IR/oncology/palliative care  Procedures: Percutaneous biliary drain placement by IR on 12/23/2017 Antimicrobials: Meropenem 1 dose on 12/23/2017.  Meropenem continued from 12/24/2017 onwards  Subjective: Patient seen.  No new complaints.  Follow final cultures.  Likely discharge home in the next 1 to 2 days.     Objective: Vitals:   12/26/17 1302 12/26/17 2031 12/27/17 0436 12/27/17 1435  BP: 111/78 120/76 100/74 103/64  Pulse: 100 100 89 98  Resp:  18 14 18 18   Temp: 97.8 F (36.6 C) 99.6  F (37.6 C) (!) 97.5 F (36.4 C) 98.5 F (36.9 C)  TempSrc: Oral Oral Oral Oral  SpO2: 100% 100% 98% 100%  Weight:      Height:        Intake/Output Summary (Last 24 hours) at 12/27/2017 1642 Last data filed at 12/27/2017 1100 Gross per 24 hour  Intake 3051.39 ml  Output 3275 ml  Net -223.61 ml   Filed Weights   12/22/17 0818  Weight: 95.3 kg    Examination:  General exam: No acute distress.   Respiratory system: Bilateral decreased breath sounds at bases Cardiovascular system: Rate controlled, S1-S2 heard Gastrointestinal system: Abdomen is nondistended, soft and mildly tender around the drain site. Normal bowel sounds heard. Extremities: No cyanosis, edema   Data Reviewed: I have personally reviewed following labs and imaging studies  CBC: Recent Labs  Lab 12/22/17 1004  12/23/17 0452 12/24/17 0636 12/25/17 0609 12/26/17 0500 12/27/17 0632  WBC 6.5  --  5.4 5.2 5.5 4.8 4.5  NEUTROABS 5.0  --   --  4.6 4.0 3.0 2.6  HGB 9.4*   < > 8.5* 7.8* 7.4* 7.3* 7.9*  HCT 30.1*   < > 27.7* 24.6* 23.9* 23.2* 25.3*  MCV 94.1  --  93.0 93.9 93.7 93.5 91.7  PLT 300  --  256 131* 122* 99* 89*   < > = values in this interval not displayed.   Basic Metabolic Panel: Recent Labs  Lab 12/22/17 1004  12/23/17 0452 12/24/17 0636 12/25/17 0609 12/26/17 0500 12/27/17 0632  NA 132*   < > 131* 127* 133* 133* 132*  K 4.0   < > 3.9 3.8 3.3* 3.6 3.6  CL 98   < > 102 99 105 101 103  CO2 26  --  23 18* 21* 19* 20*  GLUCOSE 163*   < > 178* 149* 165* 124* 139*  BUN 19   < > 13 23 22 13 14   CREATININE 0.54*   < > 0.52* 1.25* 0.63 0.55* 0.56*  CALCIUM 9.0  --  8.5* 7.8* 8.4* 8.2* 8.5*  MG 1.9  --   --   --  2.0 1.6* 1.8   < > = values in this interval not displayed.   GFR: Estimated Creatinine Clearance: 109.9 mL/min (A) (by C-G formula based on SCr of 0.56 mg/dL (L)). Liver Function Tests: Recent Labs  Lab 12/23/17 0452 12/24/17 0636 12/25/17 0609 12/26/17 0500 12/27/17 0632   AST 86* 65* 46* 40 40  ALT 112* 80* 70* 56* 50*  ALKPHOS 419* 305* 239* 225* 249*  BILITOT 3.2* 5.6* 4.3* 3.7* 3.1*  PROT 6.9 6.4* 6.4* 6.4* 6.5  ALBUMIN 2.5* 2.2* 2.3* 2.2* 2.4*   Recent Labs  Lab 12/22/17 1004  LIPASE 26   No results for input(s): AMMONIA in the last 168 hours. Coagulation Profile: Recent Labs  Lab 12/22/17 1004 12/23/17 0452  INR 1.15 1.18   Cardiac Enzymes: No results for input(s): CKTOTAL, CKMB, CKMBINDEX, TROPONINI in the last 168 hours. BNP (last 3 results) No results for input(s): PROBNP in the last 8760 hours. HbA1C: No results for input(s): HGBA1C in the last 72 hours. CBG: Recent Labs  Lab 12/22/17 2209 12/25/17 2002 12/26/17 0803 12/26/17 1120 12/26/17 1729  GLUCAP 176* 245* 108* 185* 129*   Lipid Profile: No results for input(s): CHOL, HDL, LDLCALC, TRIG, CHOLHDL, LDLDIRECT in the last 72 hours. Thyroid Function Tests: No results for  input(s): TSH, T4TOTAL, FREET4, T3FREE, THYROIDAB in the last 72 hours. Anemia Panel: No results for input(s): VITAMINB12, FOLATE, FERRITIN, TIBC, IRON, RETICCTPCT in the last 72 hours. Sepsis Labs: No results for input(s): PROCALCITON, LATICACIDVEN in the last 168 hours.  Recent Results (from the past 240 hour(s))  Culture, Urine     Status: None   Collection Time: 12/22/17  5:26 PM  Result Value Ref Range Status   Specimen Description   Final    URINE, RANDOM Performed at [facility] [address], [place], [place] [address]    Special Requests   Final    NONE Performed at [facility], [place] [address] [place], [place] [address]    Culture   Final    NO GROWTH Performed at [facility], [place] [address], [place], [place] [address]    Report Status 12/24/2017 FINAL  Final  Culture, blood (Routine X 2) w Reflex to ID Panel     Status: Abnormal   Collection Time: 12/23/17  4:11 PM  Result Value Ref Range Status   Specimen Description   Final     BLOOD LEFT ARM Performed at [facility] [address] [place], [place] [address]    Special Requests   Final    BOTTLES DRAWN AEROBIC AND ANAEROBIC Blood Culture adequate volume Performed at [facility] [address], [place], [place] [address]    Culture  Setup Time   Final    GRAM NEGATIVE RODS AEROBIC BOTTLE ONLY CRITICAL RESULT CALLED TO, READ BACK BY AND VERIFIED WITH: E. WILLIAMSON, PHARMD ([facility]) AT 0915 ON 12/24/17 BY C. JESSUP, MLT.    Culture (A)  Final    ESCHERICHIA COLI SUSCEPTIBILITIES PERFORMED ON PREVIOUS CULTURE WITHIN THE LAST 5 DAYS. Performed at [facility], [place] [address], [place], [place] [address]    Report Status 12/27/2017 FINAL  Final  Culture, blood (Routine X 2) w Reflex to ID Panel     Status: Abnormal   Collection Time: 12/23/17  4:12 PM  Result Value Ref Range Status   Specimen Description   Final    BLOOD RIGHT ARM Performed at [facility] [address], [place], [place] [address]    Special Requests   Final    BOTTLES DRAWN AEROBIC AND ANAEROBIC Blood Culture results may not be optimal due to an inadequate volume of blood received in culture bottles Performed at [facility] [address], [place], [place] [address]    Culture  Setup Time   Final    GRAM NEGATIVE RODS AEROBIC BOTTLE ONLY CRITICAL RESULT CALLED TO, READ BACK BY AND VERIFIED WITH: E. WILLIAMSON, PHARMD ([facility]) AT 0915 ON 12/24/17 BY C. JESSUP, MLT. Performed at [facility], [place] [address], [place],  [address]    Culture (A)  Final    ESCHERICHIA COLI Confirmed Extended Spectrum Beta-Lactamase Producer (ESBL).  In bloodstream infections from ESBL organisms, carbapenems are preferred over piperacillin/tazobactam. They are shown to have a lower risk of mortality.    Report Status 12/27/2017 FINAL  Final   Organism ID, Bacteria ESCHERICHIA COLI  Final      Susceptibility    Escherichia coli - MIC*    AMPICILLIN >=32 RESISTANT Resistant     CEFAZOLIN >=64 RESISTANT Resistant     CEFEPIME RESISTANT Resistant     CEFTAZIDIME >=64 RESISTANT Resistant     CEFTRIAXONE >=64 RESISTANT Resistant  CIPROFLOXACIN 1 SENSITIVE Sensitive     GENTAMICIN <=1 SENSITIVE Sensitive     IMIPENEM <=0.25 SENSITIVE Sensitive     TRIMETH/SULFA <=20 SENSITIVE Sensitive     AMPICILLIN/SULBACTAM >=32 RESISTANT Resistant     PIP/TAZO >=128 RESISTANT Resistant     Extended ESBL POSITIVE Resistant     * ESCHERICHIA COLI  Blood Culture ID Panel (Reflexed)     Status: Abnormal   Collection Time: 12/23/17  4:12 PM  Result Value Ref Range Status   Enterococcus species NOT DETECTED NOT DETECTED Final   Listeria monocytogenes NOT DETECTED NOT DETECTED Final   Staphylococcus species NOT DETECTED NOT DETECTED Final   Staphylococcus aureus (BCID) NOT DETECTED NOT DETECTED Final   Streptococcus species NOT DETECTED NOT DETECTED Final   Streptococcus agalactiae NOT DETECTED NOT DETECTED Final   Streptococcus pneumoniae NOT DETECTED NOT DETECTED Final   Streptococcus pyogenes NOT DETECTED NOT DETECTED Final   Acinetobacter baumannii NOT DETECTED NOT DETECTED Final   Enterobacteriaceae species DETECTED (A) NOT DETECTED Final    Comment: Enterobacteriaceae represent a large family of gram-negative bacteria, not a single organism. CRITICAL RESULT CALLED TO, READ BACK BY AND VERIFIED WITH: E. WILLIAMSON, PHARMD (WL) AT 0915 ON 12/24/17 BY C. JESSUP, MLT.    Enterobacter cloacae complex NOT DETECTED NOT DETECTED Final   Escherichia coli DETECTED (A) NOT DETECTED Final    Comment: CRITICAL RESULT CALLED TO, READ BACK BY AND VERIFIED WITH: E. WILLIAMSON, PHARMD (WL) AT 0915 ON 12/24/17 BY C. JESSUP, MLT.    Klebsiella oxytoca NOT DETECTED NOT DETECTED Final   Klebsiella pneumoniae NOT DETECTED NOT DETECTED Final   Proteus species NOT DETECTED NOT DETECTED Final   Serratia marcescens NOT  DETECTED NOT DETECTED Final   Carbapenem resistance NOT DETECTED NOT DETECTED Final   Haemophilus influenzae NOT DETECTED NOT DETECTED Final   Neisseria meningitidis NOT DETECTED NOT DETECTED Final   Pseudomonas aeruginosa NOT DETECTED NOT DETECTED Final   Candida albicans NOT DETECTED NOT DETECTED Final   Candida glabrata NOT DETECTED NOT DETECTED Final   Candida krusei NOT DETECTED NOT DETECTED Final   Candida parapsilosis NOT DETECTED NOT DETECTED Final   Candida tropicalis NOT DETECTED NOT DETECTED Final    Comment: Performed at [facility], [place] [address], [place], [place] [address]  Body fluid culture     Status: None   Collection Time: 12/23/17 11:52 PM  Result Value Ref Range Status   Specimen Description   Final    BILE Performed at [facility] [address], [place], [place] [address]    Special Requests   Final    Normal Performed at [facility], [place] [address] [place], [place] [address]    Gram Stain   Final    MODERATE WBC PRESENT, PREDOMINANTLY PMN RARE GRAM POSITIVE COCCI RARE GRAM NEGATIVE RODS RARE YEAST Performed at [facility], [place] [address], [place], [place] [address]    Culture RARE ESCHERICHIA COLI FEW CANDIDA ALBICANS   Final   Report Status 12/27/2017 FINAL  Final   Organism ID, Bacteria ESCHERICHIA COLI  Final      Susceptibility   Escherichia coli - MIC*    AMPICILLIN >=32 RESISTANT Resistant     CEFAZOLIN >=64 RESISTANT Resistant     CEFEPIME <=1 SENSITIVE Sensitive     CEFTAZIDIME >=64 RESISTANT Resistant     CEFTRIAXONE >=64 RESISTANT Resistant     CIPROFLOXACIN 1 SENSITIVE Sensitive  GENTAMICIN <=1 SENSITIVE Sensitive     IMIPENEM 1 SENSITIVE Sensitive     TRIMETH/SULFA <=20 SENSITIVE Sensitive     AMPICILLIN/SULBACTAM >=32 RESISTANT Resistant     PIP/TAZO >=128 RESISTANT Resistant     Extended ESBL NEGATIVE Sensitive     * RARE ESCHERICHIA COLI  Culture, blood (routine x 2)      Status: None (Preliminary result)   Collection Time: 12/26/17  8:49 AM  Result Value Ref Range Status   Specimen Description   Final    BLOOD LEFT HAND Performed at [facility] [address], [place], [place] [address]    Special Requests   Final    BOTTLES DRAWN AEROBIC AND ANAEROBIC Blood Culture adequate volume Performed at [facility] [address], [place], [place] [address]    Culture   Final    NO GROWTH < 24 HOURS Performed at [facility] [address] [place], [place] [address]    Report Status PENDING  Incomplete  Culture, blood (routine x 2)     Status: None (Preliminary result)   Collection Time: 12/26/17  8:49 AM  Result Value Ref Range Status   Specimen Description   Final    BLOOD LEFT HAND Performed at [facility] [address], [place], [place] [address]    Special Requests   Final    BOTTLES DRAWN AEROBIC ONLY Blood Culture results may not be optimal due to an inadequate volume of blood received in culture bottles Performed at [facility] [address], [place], [place] [address]    Culture   Final    NO GROWTH < 24 HOURS Performed at [facility] [address], [place],  [address]    Report Status PENDING  Incomplete         Radiology Studies: No results found.      Scheduled Meds: . aspirin EC  81 mg Oral Daily  . celecoxib  200 mg Oral QHS  . enoxaparin (LOVENOX) injection  40 mg Subcutaneous Q24H  . feeding supplement (GLUCERNA SHAKE)  237 mL Oral TID BM  . gabapentin  400 mg Oral Daily   And  . gabapentin  800 mg Oral QHS  . loratadine  10 mg Oral Daily  . multivitamin with minerals  1 tablet Oral Daily  . polyethylene glycol  17 g Oral BID  . ENSURE MAX PROTEIN  11 oz Oral BID  . senna-docusate  1 tablet Oral BID  . sodium chloride flush  3 mL Intravenous Q12H  . sodium chloride flush  5 mL Intracatheter Q8H  . tamsulosin   0.4 mg Oral QPC supper   Continuous Infusions: . fluconazole (DIFLUCAN) IV 400 mg (12/26/17 2225)  . meropenem (MERREM) IV 1 g (12/27/17 1026)     LOS: 4 days    Time spent 35 minutes.  Bonnell Public, MD [facility] Pager [phone].    If 7PM-7AM, please contact night-coverage [email or web address] Password [facility] 12/27/2017, 4:42 PM

## 2017-12-27 NOTE — Progress Notes (Signed)
Pharmacy Antibiotic Note  GAUTHAM HEWINS is a 62 y.o. male admitted on 12/22/2017 with dehydratoin.  Pharmacy has been consulted for meropem dosing. 12/27/2017 Full day #4 meropenem for 12/17 BCx with ESBL E Coli, repeat BCx ng< 24 hrs day # 2 fluconazole for few candida albicans in bile cx on 12/17 WBC WNL, AF, SCr stable. Has R & L biliary drains in place.   Plan: Continue meropenem 1 gm IV q8 Continue fluconazole 400 mg IV q24>> consider changing to PO F/u renal fxn, WBC, temp, culture data F/o length of therapy of carbapenem (could DC on ertapenem)  Height: 5\' 11"  (180.3 cm) Weight: 210 lb (95.3 kg) IBW/kg (Calculated) : 75.3  Temp (24hrs), Avg:98.3 F (36.8 C), Min:97.5 F (36.4 C), Max:99.6 F (37.6 C)  Recent Labs  Lab 12/23/17 0452 12/24/17 0636 12/25/17 0609 12/26/17 0500 12/27/17 0632  WBC 5.4 5.2 5.5 4.8 4.5  CREATININE 0.52* 1.25* 0.63 0.55* 0.56*    Estimated Creatinine Clearance: 109.9 mL/min (A) (by C-G formula based on SCr of 0.56 mg/dL (L)).    Allergies  Allergen Reactions  . Morphine And Related Other (See Comments)    Patient is recovering from drug addiction and wants to avoid any narcotics    Antimicrobials this admission:  12/18 meropenem >>  12/19 fluconazole >>  Dose adjustments this admission:   Microbiology results:  12/16 Ucx: NGF 12/17 BCx >> 2/4 bottles E.coli, confirmed ESBL 12/17 Bile culture: rare gr+ cocci, rar gr- rods, rare yeast - reincubated for better growth - few C.albicans and Rare Ecoli - resistant to amp, ancef, ceftaz, rocephin, Unasyn and zosyn 12/20 BCx: ngtd  Previous: 12/4: BCx: NGF 11/27 BCx: ESBL E.coli    Thank you for allowing pharmacy to be a part of this patient's care.  Eudelia Bunch, Pharm.D [phone] 12/27/2017 10:49 AM

## 2017-12-28 LAB — GLUCOSE, CAPILLARY: Glucose-Capillary: 122 mg/dL — ABNORMAL HIGH (ref 70–99)

## 2017-12-28 NOTE — Progress Notes (Signed)
Patient ID: Matthew Lewis, male   DOB: [birth year], 62 y.o.   MRN: [id number]  PROGRESS NOTE    NAKOTA ELSEN  IAX:[id number] DOB: 1955-03-03 DOA: 12/22/2017 PCP: [name], MD   Brief Narrative:  62 year old male with history of intrahepatic cholangiocarcinoma with metastasis to the liver and lung on palliative chemotherapy, prior history of alcohol abuse with cirrhosis, status post percutaneous biliary drainage for biliary obstruction, recent hospitalization from December 03, 2017 to December 11, 2017 for septic shock secondary to ESBL E. coli bacteremia status post 2 weeks of IV antibiotics presented on 12/22/2017 with inability to urinate and constipation.  Patient was dehydrated on admission.  Oncology recommended IR evaluation for increasing bilirubin levels.  Patient underwent IR guided percutaneous biliary drain replacement on 12/23/2017.  12/28/2017: Patient seen alongside patient's wife.  Patient is improving.  Discussed case with the infectious disease consultant, patient will need 2 weeks of IV antibiotics.  As per the infectious disease team, there is no need to remove current port.  We will arrange for home antibiotics.  Will consult the case management team.   Assessment & Plan:   Principal Problem:   Dehydration Active Problems:   Hypertension   Hyperlipemia   Diabetes mellitus ([place])   CAD (coronary artery disease)- PCI 2001- no evaluation since   Moderate protein-calorie malnutrition (HCC)   Alcoholic cirrhosis of liver without ascites (HCC)   Biliary obstruction due to malignant neoplasm (HCC)   Transaminitis   Malignant neoplasm metastatic to adrenal gland (HCC)   Intrahepatic cholangiocarcinoma (HCC)   Cholangiocarcinoma (HCC)   Bladder outlet obstruction   Constipation   E. coli bacteremia  -Patient had temperature spike on 12/23/2017 and blood culture is positive for E. coli.  Patient had recent ESBL E. coli bacteremia which was treated with IV  antibiotics -Susceptibilities pending.  Continue meropenem.  Follow repeat blood cultures from today.  If blood cultures persistently positive, Port-A-Cath infection might be a possibility as well. -Bile culture is positive for yeast.  Diflucan also started.  Follow LFTs 12/27/2017: Follow repeat cultures.  Complete course of antibiotics. 12/28/2017: Patient will need 2 weeks of antibiotics.  Will consult case management team.  Dehydration  -Treated with IV fluids.  Improved.  Tolerating diet.  Constipation -Improving.  Continue current bowel regimen  Urinary retention status post Foley catheter placement -Foley catheter was removed on 12/24/2017.  Patient is urinating well as per the patient.  Flomax added.  Might need outpatient urology evaluation. 12/27/2017: Continue to monitor.  Intrahepatic cholangiocarcinoma -On palliative chemotherapy.  Oncology following. -Overall prognosis is poor with recurrent bacteremia.  Palliative care evaluation pending.  Moderate malnutrition -Follow nutrition recommendations  Chronic anemia from anemia of chronic disease -Hemoglobin stable.  Transfuse if less than 7  Hypokalemia -Improved.  Repeat a.m. labs 12/27/2017: Potassium is 3.6 today.  Mild dyspnea is 1.8.  Continue to monitor closely.  Hypomagnesemia -Replace.  Repeat a.m. labs 12/27/2017: Magnesium is 1.8 today.  Continue to monitor closely.  History of hypertension -Blood pressure stable.  Currently on the lower side.  Monitor  Diabetes mellitus type 2  -A1c was 4.9 in November 2019.  Continue CBGs with SSI  Alcoholic liver cirrhosis  -Monitor.  No signs of worsening ascites   DVT prophylaxis: Lovenox Code Status: Full Family Communication: No family at bedside Disposition Plan: Home in the 1 to 3 days days if clinically stable and if repeat blood cultures are negative  Consultants: IR/oncology/palliative care  Procedures: Percutaneous biliary drain  placement by IR on  12/23/2017 Antimicrobials: Meropenem 1 dose on 12/23/2017.  Meropenem continued from 12/24/2017 onwards  Subjective: Patient seen alongside patient's wife.  No new complaints.    Objective: Vitals:   12/27/17 2100 12/28/17 0417 12/28/17 1343 12/28/17 1722  BP: 110/73 105/73 113/79 127/80  Pulse: (!) 102 72 87 92  Resp: 16 18 16    Temp: 99.4 F (37.4 C) 97.7 F (36.5 C) 97.9 F (36.6 C)   TempSrc: Oral Oral Oral   SpO2: 97% 98% 100% 100%  Weight:      Height:        Intake/Output Summary (Last 24 hours) at 12/28/2017 1829 Last data filed at 12/28/2017 1700 Gross per 24 hour  Intake 485 ml  Output 4178 ml  Net -3693 ml   Filed Weights   12/22/17 0818  Weight: 95.3 kg    Examination:  General exam: No acute distress.   Respiratory system: Bilateral decreased breath sounds at bases Cardiovascular system: Rate controlled, S1-S2 heard Gastrointestinal system: Abdomen is nondistended, soft and mildly tender around the drain site. Normal bowel sounds heard. Extremities: No cyanosis, edema   Data Reviewed: I have personally reviewed following labs and imaging studies  CBC: Recent Labs  Lab 12/22/17 1004  12/23/17 0452 12/24/17 0636 12/25/17 0609 12/26/17 0500 12/27/17 0632  WBC 6.5  --  5.4 5.2 5.5 4.8 4.5  NEUTROABS 5.0  --   --  4.6 4.0 3.0 2.6  HGB 9.4*   < > 8.5* 7.8* 7.4* 7.3* 7.9*  HCT 30.1*   < > 27.7* 24.6* 23.9* 23.2* 25.3*  MCV 94.1  --  93.0 93.9 93.7 93.5 91.7  PLT 300  --  256 131* 122* 99* 89*   < > = values in this interval not displayed.   Basic Metabolic Panel: Recent Labs  Lab 12/22/17 1004  12/23/17 0452 12/24/17 0636 12/25/17 0609 12/26/17 0500 12/27/17 0632  NA 132*   < > 131* 127* 133* 133* 132*  K 4.0   < > 3.9 3.8 3.3* 3.6 3.6  CL 98   < > 102 99 105 101 103  CO2 26  --  23 18* 21* 19* 20*  GLUCOSE 163*   < > 178* 149* 165* 124* 139*  BUN 19   < > 13 23 22 13 14   CREATININE 0.54*   < > 0.52* 1.25* 0.63 0.55* 0.56*  CALCIUM  9.0  --  8.5* 7.8* 8.4* 8.2* 8.5*  MG 1.9  --   --   --  2.0 1.6* 1.8   < > = values in this interval not displayed.   GFR: Estimated Creatinine Clearance: 109.9 mL/min (A) (by C-G formula based on SCr of 0.56 mg/dL (L)). Liver Function Tests: Recent Labs  Lab 12/23/17 0452 12/24/17 0636 12/25/17 0609 12/26/17 0500 12/27/17 0632  AST 86* 65* 46* 40 40  ALT 112* 80* 70* 56* 50*  ALKPHOS 419* 305* 239* 225* 249*  BILITOT 3.2* 5.6* 4.3* 3.7* 3.1*  PROT 6.9 6.4* 6.4* 6.4* 6.5  ALBUMIN 2.5* 2.2* 2.3* 2.2* 2.4*   Recent Labs  Lab 12/22/17 1004  LIPASE 26   No results for input(s): AMMONIA in the last 168 hours. Coagulation Profile: Recent Labs  Lab 12/22/17 1004 12/23/17 0452  INR 1.15 1.18   Cardiac Enzymes: No results for input(s): CKTOTAL, CKMB, CKMBINDEX, TROPONINI in the last 168 hours. BNP (last 3 results) No results for input(s): PROBNP in the last 8760 hours. HbA1C: No results for  input(s): HGBA1C in the last 72 hours. CBG: Recent Labs  Lab 12/25/17 2002 12/26/17 0803 12/26/17 1120 12/26/17 1729 12/28/17 1723  GLUCAP 245* 108* 185* 129* 122*   Lipid Profile: No results for input(s): CHOL, HDL, LDLCALC, TRIG, CHOLHDL, LDLDIRECT in the last 72 hours. Thyroid Function Tests: No results for input(s): TSH, T4TOTAL, FREET4, T3FREE, THYROIDAB in the last 72 hours. Anemia Panel: No results for input(s): VITAMINB12, FOLATE, FERRITIN, TIBC, IRON, RETICCTPCT in the last 72 hours. Sepsis Labs: No results for input(s): PROCALCITON, LATICACIDVEN in the last 168 hours.  Recent Results (from the past 240 hour(s))  Culture, Urine     Status: None   Collection Time: 12/22/17  5:26 PM  Result Value Ref Range Status   Specimen Description   Final    URINE, RANDOM Performed at [facility] [address] [place], [place] [address]    Special Requests   Final    NONE Performed at [facility], [place] [address], [place],  [place] [address]    Culture   Final    NO GROWTH Performed at [facility], [place] [address] [place], [place] [address]    Report Status 12/24/2017 FINAL  Final  Culture, blood (Routine X 2) w Reflex to ID Panel     Status: Abnormal   Collection Time: 12/23/17  4:11 PM  Result Value Ref Range Status   Specimen Description   Final    BLOOD LEFT ARM Performed at [facility] [address] [place], [place] [address]    Special Requests   Final    BOTTLES DRAWN AEROBIC AND ANAEROBIC Blood Culture adequate volume Performed at [facility] [address], [place], [place] [address]    Culture  Setup Time   Final    GRAM NEGATIVE RODS AEROBIC BOTTLE ONLY CRITICAL RESULT CALLED TO, READ BACK BY AND VERIFIED WITH: E. WILLIAMSON, PHARMD ([facility]) AT 0915 ON 12/24/17 BY C. JESSUP, MLT.    Culture (A)  Final    ESCHERICHIA COLI SUSCEPTIBILITIES PERFORMED ON PREVIOUS CULTURE WITHIN THE LAST 5 DAYS. Performed at [facility], [place] [address] [place], [place] [address]    Report Status 12/27/2017 FINAL  Final  Culture, blood (Routine X 2) w Reflex to ID Panel     Status: Abnormal   Collection Time: 12/23/17  4:12 PM  Result Value Ref Range Status   Specimen Description   Final    BLOOD RIGHT ARM Performed at [facility] [address], [place], [place] [address]    Special Requests   Final    BOTTLES DRAWN AEROBIC AND ANAEROBIC Blood Culture results may not be optimal due to an inadequate volume of blood received in culture bottles Performed at [facility] [address], [place], [place] [address]    Culture  Setup Time   Final    GRAM NEGATIVE RODS AEROBIC BOTTLE ONLY CRITICAL RESULT CALLED TO, READ BACK BY AND VERIFIED WITH: E. WILLIAMSON, PHARMD (WL) AT 0915 ON 12/24/17 BY C. JESSUP, MLT. Performed at [facility], [place] [address] [place], [place] [address]    Culture (A)  Final    ESCHERICHIA  COLI Confirmed Extended Spectrum Beta-Lactamase Producer (ESBL).  In bloodstream infections from ESBL organisms, carbapenems are preferred over piperacillin/tazobactam. They are shown to have a lower risk of mortality.    Report Status 12/27/2017 FINAL  Final   Organism ID, Bacteria ESCHERICHIA  COLI  Final      Susceptibility   Escherichia coli - MIC*    AMPICILLIN >=32 RESISTANT Resistant     CEFAZOLIN >=64 RESISTANT Resistant     CEFEPIME RESISTANT Resistant     CEFTAZIDIME >=64 RESISTANT Resistant     CEFTRIAXONE >=64 RESISTANT Resistant     CIPROFLOXACIN 1 SENSITIVE Sensitive     GENTAMICIN <=1 SENSITIVE Sensitive     IMIPENEM <=0.25 SENSITIVE Sensitive     TRIMETH/SULFA <=20 SENSITIVE Sensitive     AMPICILLIN/SULBACTAM >=32 RESISTANT Resistant     PIP/TAZO >=128 RESISTANT Resistant     Extended ESBL POSITIVE Resistant     * ESCHERICHIA COLI  Blood Culture ID Panel (Reflexed)     Status: Abnormal   Collection Time: 12/23/17  4:12 PM  Result Value Ref Range Status   Enterococcus species NOT DETECTED NOT DETECTED Final   Listeria monocytogenes NOT DETECTED NOT DETECTED Final   Staphylococcus species NOT DETECTED NOT DETECTED Final   Staphylococcus aureus (BCID) NOT DETECTED NOT DETECTED Final   Streptococcus species NOT DETECTED NOT DETECTED Final   Streptococcus agalactiae NOT DETECTED NOT DETECTED Final   Streptococcus pneumoniae NOT DETECTED NOT DETECTED Final   Streptococcus pyogenes NOT DETECTED NOT DETECTED Final   Acinetobacter baumannii NOT DETECTED NOT DETECTED Final   Enterobacteriaceae species DETECTED (A) NOT DETECTED Final    Comment: Enterobacteriaceae represent a large family of gram-negative bacteria, not a single organism. CRITICAL RESULT CALLED TO, READ BACK BY AND VERIFIED WITH: E. WILLIAMSON, PHARMD (WL) AT 0915 ON 12/24/17 BY C. JESSUP, MLT.    Enterobacter cloacae complex NOT DETECTED NOT DETECTED Final   Escherichia coli DETECTED (A) NOT DETECTED Final      Comment: CRITICAL RESULT CALLED TO, READ BACK BY AND VERIFIED WITH: E. WILLIAMSON, PHARMD (WL) AT 0915 ON 12/24/17 BY C. JESSUP, MLT.    Klebsiella oxytoca NOT DETECTED NOT DETECTED Final   Klebsiella pneumoniae NOT DETECTED NOT DETECTED Final   Proteus species NOT DETECTED NOT DETECTED Final   Serratia marcescens NOT DETECTED NOT DETECTED Final   Carbapenem resistance NOT DETECTED NOT DETECTED Final   Haemophilus influenzae NOT DETECTED NOT DETECTED Final   Neisseria meningitidis NOT DETECTED NOT DETECTED Final   Pseudomonas aeruginosa NOT DETECTED NOT DETECTED Final   Candida albicans NOT DETECTED NOT DETECTED Final   Candida glabrata NOT DETECTED NOT DETECTED Final   Candida krusei NOT DETECTED NOT DETECTED Final   Candida parapsilosis NOT DETECTED NOT DETECTED Final   Candida tropicalis NOT DETECTED NOT DETECTED Final    Comment: Performed at [facility], [place] [address], [place], [place] [address]  Body fluid culture     Status: None   Collection Time: 12/23/17 11:52 PM  Result Value Ref Range Status   Specimen Description   Final    BILE Performed at [facility] [address], [place], [place] [address]    Special Requests   Final    Normal Performed at [facility], [place] [address] [place], [place] [address]    Gram Stain   Final    MODERATE WBC PRESENT, PREDOMINANTLY PMN RARE GRAM POSITIVE COCCI RARE GRAM NEGATIVE RODS RARE YEAST Performed at [facility], [place] [address], [place], [place] [address]    Culture RARE ESCHERICHIA COLI FEW CANDIDA ALBICANS   Final   Report Status 12/27/2017 FINAL  Final   Organism ID, Bacteria ESCHERICHIA COLI  Final  Susceptibility   Escherichia coli - MIC*    AMPICILLIN >=32 RESISTANT Resistant     CEFAZOLIN >=64 RESISTANT Resistant     CEFEPIME <=1 SENSITIVE Sensitive     CEFTAZIDIME >=64 RESISTANT Resistant     CEFTRIAXONE >=64 RESISTANT Resistant     CIPROFLOXACIN 1  SENSITIVE Sensitive     GENTAMICIN <=1 SENSITIVE Sensitive     IMIPENEM 1 SENSITIVE Sensitive     TRIMETH/SULFA <=20 SENSITIVE Sensitive     AMPICILLIN/SULBACTAM >=32 RESISTANT Resistant     PIP/TAZO >=128 RESISTANT Resistant     Extended ESBL NEGATIVE Sensitive     * RARE ESCHERICHIA COLI  Culture, blood (routine x 2)     Status: None (Preliminary result)   Collection Time: 12/26/17  8:49 AM  Result Value Ref Range Status   Specimen Description   Final    BLOOD LEFT HAND Performed at [facility] [address] [place], [place] [address]    Special Requests   Final    BOTTLES DRAWN AEROBIC AND ANAEROBIC Blood Culture adequate volume Performed at [facility] [address], [place], [place] [address]    Culture   Final    NO GROWTH 2 DAYS Performed at [facility] [address], [place], [place] [address]    Report Status PENDING  Incomplete  Culture, blood (routine x 2)     Status: None (Preliminary result)   Collection Time: 12/26/17  8:49 AM  Result Value Ref Range Status   Specimen Description   Final    BLOOD LEFT HAND Performed at [facility] [address], [place], [place] [address]    Special Requests   Final    BOTTLES DRAWN AEROBIC ONLY Blood Culture results may not be optimal due to an inadequate volume of blood received in culture bottles Performed at [facility] [address], [place], [place] [address]    Culture   Final    NO GROWTH 2 DAYS Performed at [facility] [address] [place] [place], [place] [address]    Report Status PENDING  Incomplete         Radiology Studies: No results found.      Scheduled Meds: . aspirin EC  81 mg Oral Daily  . celecoxib  200 mg Oral QHS  . enoxaparin (LOVENOX) injection  40 mg Subcutaneous Q24H  . feeding supplement (GLUCERNA SHAKE)  237 mL Oral TID BM  . gabapentin  400 mg Oral Daily   And  . gabapentin  800 mg  Oral QHS  . loratadine  10 mg Oral Daily  . multivitamin with minerals  1 tablet Oral Daily  . polyethylene glycol  17 g Oral BID  . ENSURE MAX PROTEIN  11 oz Oral BID  . senna-docusate  1 tablet Oral BID  . sodium chloride flush  3 mL Intravenous Q12H  . sodium chloride flush  5 mL Intracatheter Q8H  . tamsulosin  0.4 mg Oral QPC supper   Continuous Infusions: . fluconazole (DIFLUCAN) IV 400 mg (12/27/17 2157)  . meropenem (MERREM) IV 1 g (12/28/17 1802)     LOS: 5 days    Time spent 35 minutes.  Bonnell Public, MD [facility] Pager [phone].    If 7PM-7AM, please contact night-coverage [email or web address] Password [facility] 12/28/2017, 6:29 PM

## 2017-12-29 DIAGNOSIS — E46 Unspecified protein-calorie malnutrition: Secondary | ICD-10-CM

## 2017-12-29 DIAGNOSIS — Z66 Do not resuscitate: Secondary | ICD-10-CM

## 2017-12-29 DIAGNOSIS — B9620 Unspecified Escherichia coli [E. coli] as the cause of diseases classified elsewhere: Secondary | ICD-10-CM

## 2017-12-29 NOTE — Progress Notes (Signed)
PHARMACY CONSULT NOTE FOR:  OUTPATIENT  PARENTERAL ANTIBIOTIC THERAPY (OPAT)  Indication:  ESBL Ecoli bacteremia Regimen: meropenem 1gm IV q8h End date: 01/08/2018  IV antibiotic discharge orders are pended. To discharging provider:  please sign these orders via discharge navigator,  Select New Orders & click on the button choice - Manage This Unsigned Work.     Thank you for allowing pharmacy to be a part of this patient's care.  Lynelle Doctor 12/29/2017, 2:33 PM

## 2017-12-29 NOTE — Progress Notes (Addendum)
Patient ID: Matthew Lewis, male   DOB: 06/14/1955, 62 y.o.   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 coli bacteremia status post 2 weeks of IV antibiotics presented on 12/22/2017 with inability to urinate and constipation.  Patient was dehydrated on admission.  Oncology recommended IR evaluation for increasing bilirubin levels.  Patient underwent IR guided percutaneous biliary drain replacement on 12/23/2017.  12/29/2017: Patient seen.  Discussed case with infectious disease team.  Patient will need 2 weeks of IV antibiotics.  Patient has a port.  Case management is arranging home IV antibiotics.  Likely, patient will be discharged in the morning.  No fever or chills.  No new complaints.   Assessment & Plan:   Principal Problem:   Dehydration Active Problems:   Hypertension   Hyperlipemia   Diabetes mellitus ([place])   CAD (coronary artery disease)- PCI 2001- no evaluation since   Moderate protein-calorie malnutrition (HCC)   Alcoholic cirrhosis of liver without ascites (HCC)   Biliary obstruction due to malignant neoplasm (HCC)   Transaminitis   Malignant neoplasm metastatic to adrenal gland (HCC)   Intrahepatic cholangiocarcinoma (HCC)   Cholangiocarcinoma (HCC)   Bladder outlet obstruction   Constipation   E. coli bacteremia  -Patient had temperature spike on 12/23/2017 and blood culture is positive for E. coli.  Patient had recent ESBL E. coli bacteremia which was treated with IV antibiotics -Susceptibilities pending.  Continue  meropenem.  Follow repeat blood cultures from today.  If blood cultures persistently positive, Port-A-Cath infection might be a possibility as well. -Bile culture is positive for yeast.  Diflucan also started.  Follow LFTs 12/27/2017: Follow repeat cultures.  Complete course of antibiotics. 12/29/2017: Complete 2 weeks of IV antibiotics.    Dehydration  -Treated with IV fluids.  Improved.  Tolerating diet.  Constipation -Improving.  Continue current bowel regimen  Urinary retention status post Foley catheter placement -Foley catheter was removed on 12/24/2017.  Patient is urinating well as per the patient.  Flomax added.  Might need outpatient urology evaluation. 12/27/2017: Continue to monitor.  Intrahepatic cholangiocarcinoma -On palliative chemotherapy.  Oncology following. -Overall prognosis is poor with recurrent bacteremia.  Palliative care evaluation pending.  Moderate malnutrition -Follow nutrition recommendations  Chronic anemia from anemia of chronic disease -Hemoglobin stable.  Transfuse if less than 7  Hypokalemia -Improved.  Repeat a.m. labs 12/27/2017: Potassium is 3.6 today.  Mild dyspnea is 1.8.  Continue to monitor closely.  Hypomagnesemia -Replace.  Repeat a.m. labs 12/27/2017: Magnesium is 1.8 today.  Continue to monitor closely.  History of hypertension -Blood pressure stable.  Currently on the lower side.  Monitor  Diabetes mellitus type 2  -A1c was 4.9 in November 2019.  Continue CBGs with SSI  Alcoholic liver cirrhosis  -Monitor.  No signs of worsening ascites   DVT prophylaxis: Lovenox Code Status: Full Family Communication: No family at bedside Disposition Plan: Home in a.m. Consultants: IR/oncology/palliative care/is also discussed with the infectious disease team.  Procedures: Percutaneous biliary drain placement by IR on 12/23/2017 Antimicrobials: Meropenem 1 dose on 12/23/2017.  Meropenem continued from 12/24/2017  onwards  Subjective: Patient seen.  No new complaints.    Objective: Vitals:   12/28/17 0417 12/28/17 1343 12/28/17 1722 12/28/17 1924  BP: 105/73 113/79 127/80 126/84  Pulse: 72 87 92 96  Resp: 18 16  14   Temp: 97.7 F (36.5 C) 97.9 F (36.6 C)  97.8 F (36.6 C)  TempSrc: Oral Oral  Oral  SpO2: 98% 100% 100% 100%  Weight:      Height:        Intake/Output Summary (Last 24 hours) at 12/29/2017 0110 Last data filed at 12/29/2017 0027 Gross per 24 hour  Intake 480 ml  Output 3975 ml  Net -3495 ml   Filed Weights   12/22/17 0818  Weight: 95.3 kg    Examination:  General exam: No acute distress.   Respiratory system: Bilateral decreased breath sounds at bases Cardiovascular system: Rate controlled, S1-S2 heard Gastrointestinal system: Abdomen is nondistended, soft and mildly tender around the drain site. Normal bowel sounds heard. Extremities: No cyanosis, edema   Data Reviewed: I have personally reviewed following labs and imaging studies  CBC: Recent Labs  Lab 12/22/17 1004  12/23/17 0452 12/24/17 0636 12/25/17 0609 12/26/17 0500 12/27/17 0632  WBC 6.5  --  5.4 5.2 5.5 4.8 4.5  NEUTROABS 5.0  --   --  4.6 4.0 3.0 2.6  HGB 9.4*   < > 8.5* 7.8* 7.4* 7.3* 7.9*  HCT 30.1*   < > 27.7* 24.6* 23.9* 23.2* 25.3*  MCV 94.1  --  93.0 93.9 93.7 93.5 91.7  PLT 300  --  256 131* 122* 99* 89*   < > = values in this interval not displayed.   Basic Metabolic Panel: Recent Labs  Lab 12/22/17 1004  12/23/17 0452 12/24/17 0636 12/25/17 0609 12/26/17 0500 12/27/17 0632  NA 132*   < > 131* 127* 133* 133* 132*  K 4.0   < > 3.9 3.8 3.3* 3.6 3.6  CL 98   < > 102 99 105 101 103  CO2 26  --  23 18* 21* 19* 20*  GLUCOSE 163*   < > 178* 149* 165* 124* 139*  BUN 19   < > 13 23 22 13 14   CREATININE 0.54*   < > 0.52* 1.25* 0.63 0.55* 0.56*  CALCIUM 9.0  --  8.5* 7.8* 8.4* 8.2* 8.5*  MG 1.9  --   --   --  2.0 1.6* 1.8   < > = values in this interval not displayed.    GFR: Estimated Creatinine Clearance: 109.9 mL/min (A) (by C-G formula based on SCr of 0.56 mg/dL (L)). Liver Function Tests: Recent Labs  Lab 12/23/17 0452 12/24/17 0636 12/25/17 0609 12/26/17 0500 12/27/17 0632  AST 86* 65* 46* 40 40  ALT 112* 80* 70* 56* 50*  ALKPHOS 419* 305* 239* 225* 249*  BILITOT 3.2* 5.6* 4.3* 3.7* 3.1*  PROT 6.9 6.4* 6.4* 6.4* 6.5  ALBUMIN 2.5* 2.2* 2.3* 2.2* 2.4*   Recent Labs  Lab 12/22/17 1004  LIPASE 26   No results for input(s): AMMONIA in the last 168 hours. Coagulation Profile: Recent Labs  Lab 12/22/17 1004 12/23/17 0452  INR 1.15 1.18   Cardiac Enzymes: No results for input(s): CKTOTAL, CKMB, CKMBINDEX, TROPONINI in the last 168 hours. BNP (last 3 results) No results for input(s): PROBNP in the last 8760 hours. HbA1C: No results for input(s): HGBA1C in the last 72 hours. CBG: Recent Labs  Lab 12/25/17 2002 12/26/17 0803 12/26/17 1120 12/26/17 1729 12/28/17 1723  GLUCAP 245* 108* 185* 129* 122*   Lipid Profile: No results for input(s): CHOL, HDL, LDLCALC, TRIG, CHOLHDL, LDLDIRECT in the last 72 hours. Thyroid Function Tests: No results for input(s): TSH, T4TOTAL, FREET4, T3FREE, THYROIDAB in the last 72 hours. Anemia Panel: No results for input(s): VITAMINB12, FOLATE, FERRITIN, TIBC, IRON, RETICCTPCT in the last 72 hours. Sepsis Labs: No results for input(s): PROCALCITON, LATICACIDVEN in the last 168 hours.  Recent Results (from the past 240 hour(s))  Culture, Urine     Status: None   Collection Time: 12/22/17  5:26 PM  Result Value Ref Range Status   Specimen Description   Final    URINE, RANDOM Performed at [facility] [address] [place], [place] [address]    Special Requests   Final    NONE Performed at [facility], [place] [address] [place], [place] [address]    Culture   Final    NO GROWTH Performed at [facility], [place] [address], [place], [place]  [address]    Report Status 12/24/2017 FINAL  Final  Culture, blood (Routine X 2) w Reflex to ID Panel     Status: Abnormal   Collection Time: 12/23/17  4:11 PM  Result Value Ref Range Status   Specimen Description   Final    BLOOD LEFT ARM Performed at [facility] [address], [place], [place] [address]    Special Requests   Final    BOTTLES DRAWN AEROBIC AND ANAEROBIC Blood Culture adequate volume Performed at [facility] [address] [place], [place] [address]    Culture  Setup Time   Final    GRAM NEGATIVE RODS AEROBIC BOTTLE ONLY CRITICAL RESULT CALLED TO, READ BACK BY AND VERIFIED WITH: E. WILLIAMSON, PHARMD ([facility]) AT 0915 ON 12/24/17 BY C. JESSUP, MLT.    Culture (A)  Final    ESCHERICHIA COLI SUSCEPTIBILITIES PERFORMED ON PREVIOUS CULTURE WITHIN THE LAST 5 DAYS. Performed at [facility], [place] [address], [place], [place] [address]    Report Status 12/27/2017 FINAL  Final  Culture, blood (Routine X 2) w Reflex to ID Panel     Status: Abnormal   Collection Time: 12/23/17  4:12 PM  Result Value Ref Range Status   Specimen Description   Final    BLOOD RIGHT ARM Performed at [facility] [address] [place], [place] [address]    Special Requests   Final    BOTTLES DRAWN AEROBIC AND ANAEROBIC Blood Culture results may not be optimal due to an inadequate volume of blood received in culture bottles Performed at [facility] [address] [place], [place] [address]    Culture  Setup Time   Final    GRAM NEGATIVE RODS AEROBIC BOTTLE ONLY CRITICAL RESULT CALLED TO, READ BACK BY AND VERIFIED WITH: E. WILLIAMSON, PHARMD ([facility]) AT 0915 ON 12/24/17 BY C. JESSUP, MLT. Performed at [facility], [place] [address], [place],  [address]    Culture (A)  Final    ESCHERICHIA COLI Confirmed Extended Spectrum Beta-Lactamase Producer (ESBL).  In bloodstream infections from ESBL organisms,  carbapenems are preferred over piperacillin/tazobactam. They are shown to have a lower risk of mortality.    Report Status 12/27/2017 FINAL  Final   Organism ID, Bacteria ESCHERICHIA COLI  Final      Susceptibility   Escherichia coli - MIC*    AMPICILLIN >=32 RESISTANT Resistant  CEFAZOLIN >=64 RESISTANT Resistant     CEFEPIME RESISTANT Resistant     CEFTAZIDIME >=64 RESISTANT Resistant     CEFTRIAXONE >=64 RESISTANT Resistant     CIPROFLOXACIN 1 SENSITIVE Sensitive     GENTAMICIN <=1 SENSITIVE Sensitive     IMIPENEM <=0.25 SENSITIVE Sensitive     TRIMETH/SULFA <=20 SENSITIVE Sensitive     AMPICILLIN/SULBACTAM >=32 RESISTANT Resistant     PIP/TAZO >=128 RESISTANT Resistant     Extended ESBL POSITIVE Resistant     * ESCHERICHIA COLI  Blood Culture ID Panel (Reflexed)     Status: Abnormal   Collection Time: 12/23/17  4:12 PM  Result Value Ref Range Status   Enterococcus species NOT DETECTED NOT DETECTED Final   Listeria monocytogenes NOT DETECTED NOT DETECTED Final   Staphylococcus species NOT DETECTED NOT DETECTED Final   Staphylococcus aureus (BCID) NOT DETECTED NOT DETECTED Final   Streptococcus species NOT DETECTED NOT DETECTED Final   Streptococcus agalactiae NOT DETECTED NOT DETECTED Final   Streptococcus pneumoniae NOT DETECTED NOT DETECTED Final   Streptococcus pyogenes NOT DETECTED NOT DETECTED Final   Acinetobacter baumannii NOT DETECTED NOT DETECTED Final   Enterobacteriaceae species DETECTED (A) NOT DETECTED Final    Comment: Enterobacteriaceae represent a large family of gram-negative bacteria, not a single organism. CRITICAL RESULT CALLED TO, READ BACK BY AND VERIFIED WITH: E. WILLIAMSON, PHARMD ([facility]) AT 0915 ON 12/24/17 BY C. JESSUP, MLT.    Enterobacter cloacae complex NOT DETECTED NOT DETECTED Final   Escherichia coli DETECTED (A) NOT DETECTED Final    Comment: CRITICAL RESULT CALLED TO, READ BACK BY AND VERIFIED WITH: E. WILLIAMSON, PHARMD ([facility]) AT 0915 ON  12/24/17 BY C. JESSUP, MLT.    Klebsiella oxytoca NOT DETECTED NOT DETECTED Final   Klebsiella pneumoniae NOT DETECTED NOT DETECTED Final   Proteus species NOT DETECTED NOT DETECTED Final   Serratia marcescens NOT DETECTED NOT DETECTED Final   Carbapenem resistance NOT DETECTED NOT DETECTED Final   Haemophilus influenzae NOT DETECTED NOT DETECTED Final   Neisseria meningitidis NOT DETECTED NOT DETECTED Final   Pseudomonas aeruginosa NOT DETECTED NOT DETECTED Final   Candida albicans NOT DETECTED NOT DETECTED Final   Candida glabrata NOT DETECTED NOT DETECTED Final   Candida krusei NOT DETECTED NOT DETECTED Final   Candida parapsilosis NOT DETECTED NOT DETECTED Final   Candida tropicalis NOT DETECTED NOT DETECTED Final    Comment: Performed at [facility], [place] [address], [place], [place] [address]  Body fluid culture     Status: None   Collection Time: 12/23/17 11:52 PM  Result Value Ref Range Status   Specimen Description   Final    BILE Performed at [facility] [address] [place],  [address]    Special Requests   Final    Normal Performed at [facility], [place] [address] [place], [place] [address]    Gram Stain   Final    MODERATE WBC PRESENT, PREDOMINANTLY PMN RARE GRAM POSITIVE COCCI RARE GRAM NEGATIVE RODS RARE YEAST Performed at [facility], [place] [address] [place],  [address]    Culture RARE ESCHERICHIA COLI FEW CANDIDA ALBICANS   Final   Report Status 12/27/2017 FINAL  Final   Organism ID, Bacteria ESCHERICHIA COLI  Final      Susceptibility   Escherichia coli - MIC*    AMPICILLIN >=32 RESISTANT Resistant     CEFAZOLIN >=64 RESISTANT Resistant     CEFEPIME <=  1 SENSITIVE Sensitive     CEFTAZIDIME >=64 RESISTANT Resistant     CEFTRIAXONE >=64 RESISTANT Resistant     CIPROFLOXACIN 1 SENSITIVE Sensitive     GENTAMICIN <=1 SENSITIVE Sensitive     IMIPENEM 1 SENSITIVE Sensitive      TRIMETH/SULFA <=20 SENSITIVE Sensitive     AMPICILLIN/SULBACTAM >=32 RESISTANT Resistant     PIP/TAZO >=128 RESISTANT Resistant     Extended ESBL NEGATIVE Sensitive     * RARE ESCHERICHIA COLI  Culture, blood (routine x 2)     Status: None (Preliminary result)   Collection Time: 12/26/17  8:49 AM  Result Value Ref Range Status   Specimen Description   Final    BLOOD LEFT HAND Performed at [facility] [address] [place], [place] [address]    Special Requests   Final    BOTTLES DRAWN AEROBIC AND ANAEROBIC Blood Culture adequate volume Performed at [facility] [address] [place], [place] [address]    Culture   Final    NO GROWTH 2 DAYS Performed at [facility] [address] [place], [place] [address]    Report Status PENDING  Incomplete  Culture, blood (routine x 2)     Status: None (Preliminary result)   Collection Time: 12/26/17  8:49 AM  Result Value Ref Range Status   Specimen Description   Final    BLOOD LEFT HAND Performed at [facility] [address] [place], [place] [address]    Special Requests   Final    BOTTLES DRAWN AEROBIC ONLY Blood Culture results may not be optimal due to an inadequate volume of blood received in culture bottles Performed at [facility] [address], [place], [place] [address]    Culture   Final    NO GROWTH 2 DAYS Performed at [facility] [address], [place], [place] [address]    Report Status PENDING  Incomplete         Radiology Studies: No results found.      Scheduled Meds: . aspirin EC  81 mg Oral Daily  . celecoxib  200 mg Oral QHS  . enoxaparin (LOVENOX) injection  40 mg Subcutaneous Q24H  . feeding supplement (GLUCERNA SHAKE)  237 mL Oral TID BM  . gabapentin  400 mg Oral Daily   And  . gabapentin  800 mg Oral QHS  . loratadine  10 mg Oral Daily  . multivitamin with minerals  1 tablet Oral Daily  .  polyethylene glycol  17 g Oral BID  . ENSURE MAX PROTEIN  11 oz Oral BID  . senna-docusate  1 tablet Oral BID  . sodium chloride flush  3 mL Intravenous Q12H  . sodium chloride flush  5 mL Intracatheter Q8H  . tamsulosin  0.4 mg Oral QPC supper   Continuous Infusions: . fluconazole (DIFLUCAN) IV 400 mg (12/28/17 2131)  . meropenem (MERREM) IV 1 g (12/28/17 1802)     LOS: 6 days    Time spent 25 minutes.  Bonnell Public, MD [facility] Pager [phone].    If 7PM-7AM, please contact night-coverage [email or web address] Password [email or web address] 12/29/2017, 1:10 AM

## 2017-12-29 NOTE — Care Management (Signed)
CM consult for home IV abx for 2 weeks. Per [facility] rep note pt was already active with them prior to admission. This CM contacted [facility] infusion rep to alert her of need for home IV abx. Will need OPAT and HHRN order prior to dc. Marney Doctor RN,BSN [phone]

## 2017-12-29 NOTE — Progress Notes (Signed)
Daily Progress Note   Patient Name: Matthew Lewis       Date: 12/29/2017 DOB: 1955-03-23  Age: 62 y.o. MRN#: [id number] Attending Physician: Bonnell Public, MD Primary Care Physician: [name], MD Admit Date: 12/22/2017  Reason for Consultation/Follow-up: Establishing goals of care  Subjective:  Met today with Matthew Lewis.  We reviewed his clinical course.  He understands severity of his situation, but he is hopeful for some stabilization of function and that he may be able to receive more chemotherapy.  Reports his plan remains to continue same care plan and follow up with oncology as an OP to determine next steps.  Length of Stay: 6  Current Medications: Scheduled Meds:  . aspirin EC  81 mg Oral Daily  . celecoxib  200 mg Oral QHS  . enoxaparin (LOVENOX) injection  40 mg Subcutaneous Q24H  . feeding supplement (GLUCERNA SHAKE)  237 mL Oral TID BM  . gabapentin  400 mg Oral Daily   And  . gabapentin  800 mg Oral QHS  . loratadine  10 mg Oral Daily  . multivitamin with minerals  1 tablet Oral Daily  . polyethylene glycol  17 g Oral BID  . ENSURE MAX PROTEIN  11 oz Oral BID  . senna-docusate  1 tablet Oral BID  . sodium chloride flush  3 mL Intravenous Q12H  . sodium chloride flush  5 mL Intracatheter Q8H  . tamsulosin  0.4 mg Oral QPC supper    Continuous Infusions: . fluconazole (DIFLUCAN) IV Stopped (12/28/17 2330)  . meropenem (MERREM) IV 1 g (12/29/17 1018)    PRN Meds: acetaminophen, ipratropium-albuterol, ondansetron **OR** ondansetron (ZOFRAN) IV, oxyCODONE, sorbitol, traMADol  Physical Exam         Ill appearing gentleman lying in bed S1-S2 Clear regular breath sounds Abdomen is soft mildly distended, he has a biliary drain with leakage  noted Awake alert oriented non focal No edema  Vital Signs: BP 106/73 (BP Location: Left Arm)   Pulse 96   Temp 97.8 F (36.6 C) (Oral)   Resp 17   Ht '5\' 11"'  (1.803 m)   Wt 95.3 kg   SpO2 100%   BMI 29.29 kg/m  SpO2: SpO2: 100 % O2 Device: O2 Device: Room Air O2 Flow Rate: O2 Flow Rate (L/min): 2 L/min  Intake/output summary:   Intake/Output Summary (Last 24 hours) at 12/29/2017 1203 Last data filed at 12/29/2017 7591 Gross per 24 hour  Intake 780 ml  Output 3925 ml  Net -3145 ml   LBM: Last BM Date: 12/26/17 Baseline Weight: Weight: 95.3 kg Most recent weight: Weight: 95.3 kg       Palliative Assessment/Data:    Flowsheet Rows     Most Recent Value  Intake Tab  Referral Department  Hospitalist  Unit at Time of Referral  Oncology Unit  Palliative Care Primary Diagnosis  Cancer  Date Notified  12/24/17  Palliative Care Type  Return patient Palliative Care  Reason for referral  Clarify Goals of Care  Date of Admission  12/23/17  # of days IP prior to Palliative referral  1  Clinical Assessment  Psychosocial & Spiritual Assessment  Palliative Care Outcomes      Patient Active Problem List   Diagnosis Date Noted  . Dehydration 12/22/2017  . Bladder outlet obstruction 12/22/2017  . Constipation 12/22/2017  . Bladder outflow obstruction   . Sepsis due to gram-negative bacteria ([place])   . Severe sepsis with acute organ dysfunction due to Gram negative bacteria ([employer]) 12/03/2017  . Septic shock ([place])   . Cholangiocarcinoma ([place])   . Hypotension 11/20/2017  . Acute renal failure (ARF) ([place]) 11/20/2017  . Palliative care by specialist   . Segmental colitis ([place]) 11/09/2017  . Elevated lipase 11/09/2017  . Intrahepatic cholangiocarcinoma ([place]) 11/06/2017  . Goals of care, counseling/discussion 11/06/2017  . Cirrhosis ([place])   . Malignant neoplasm metastatic to adrenal gland ([place])   . Biliary obstruction due to malignant neoplasm ([place]) 10/23/2017  .  Transaminitis 10/23/2017  . Liver lesion 10/23/2017  . History of colon cancer 10/23/2017  . Perirectal abscess 02/24/2017  . Moderate protein-calorie malnutrition ([place]) 02/24/2017  . Sepsis ([place]) 02/24/2017  . Pulmonary nodule 02/24/2017  . Alcoholic cirrhosis of liver without ascites ([place]) 02/24/2017  . Duodenitis 06/13/2016  . Hyponatremia 06/12/2016  . Nausea & vomiting 06/12/2016  . Thrombocytopenia ([place]) 06/12/2016  . Acute gastroenteritis 06/12/2016  . PTSD (post-traumatic stress disorder) 12/02/2012  . Hx of substance abuse- clean several years now 12/02/2012  . Chest pain with moderate risk of acute coronary syndrome 11/30/2012  . Obesity 11/30/2012  . Hypertension 11/30/2012  . Hyperlipemia 11/30/2012  . Diabetes mellitus ([place]) 11/30/2012  . CAD (coronary artery disease)- PCI 2001- no evaluation since 11/30/2012    Palliative Care Assessment & Plan   Patient Profile:  62 year old male with metastatic cholangiocarcinoma with involvement of the liver, cirrhosis due to history of alcohol use, colon cancer s/p hemicolectomy, diabetes, and recurrent bacteremia.  Recommendations/Plan: - DNR/DNI - Patient knows he has serious illness but hopes to have more time. Understands that things may change quickly, but wants to have a additional treatments to prolong his life if they are offered.  He would like to continue current care and follow-up with oncology as an outpatient prior to changing any further care plans.   Code Status:    Code Status Orders  (From admission, onward)         Start     Ordered   12/22/17 1641  Do not attempt resuscitation (DNR)  Continuous    Question Answer Comment  In the event of cardiac or respiratory ARREST Do not call a "code blue"   In the event of cardiac or respiratory ARREST Do not perform Intubation, CPR, defibrillation or ACLS   In the event  of cardiac or respiratory ARREST Use medication by any route, position, wound care, and other  measures to relive pain and suffering. May use oxygen, suction and manual treatment of airway obstruction as needed for comfort.      12/22/17 1644        Code Status History    Date Active Date Inactive Code Status Order ID Comments User Context   12/03/2017 1153 12/11/2017 1659 DNR [id number]  Desiree Hane, MD Inpatient   11/21/2017 1605 11/24/2017 1856 DNR [id number]  Alma Friendly, MD Inpatient   11/20/2017 1931 11/21/2017 1605 Full Code [id number]  Yaakov Guthrie, MD ED   11/09/2017 0733 11/11/2017 1844 DNR [id number]  Toy Baker, MD ED   10/23/2017 2256 10/29/2017 1951 Full Code [id number]  Etta Quill, DO ED   02/24/2017 1951 02/25/2017 1725 DNR [id number]  Karmen Bongo, MD Inpatient   06/12/2016 2021 06/14/2016 2149 Full Code [id number]  Vianne Bulls, MD ED   11/30/2012 2030 12/02/2012 2057 Full Code [id number]  Louellen Molder, MD Inpatient   08/15/2011 1827 08/19/2011 1821 Full Code [id number]  Kathalene Frames, MD ED   06/05/2011 1927 06/08/2011 1410 Full Code [id number]  Whitaker, Marilynn Rail, RN Inpatient    Advance Directive Documentation     Most Recent Value  Type of Advance Directive  Out of facility DNR (pink MOST or yellow form)  Pre-existing out of facility DNR order (yellow form or pink MOST form)  -- [Form not with patient today ]  "MOST" Form in Place?  -       Prognosis:   Unable to determine  Discharge Planning:  Home with Home Health for continued IV Abx  Care plan was discussed with patient  Thank you for allowing the Palliative Medicine Team to assist in the care of this patient.   Total Time 20 Prolonged Time Billed No      Greater than 50%  of this time was spent counseling and coordinating care related to the above assessment and plan.  Micheline Rough, MD  Please contact Palliative Medicine Team phone at [phone] for questions and concerns.

## 2017-12-29 NOTE — Progress Notes (Signed)
Referring Physician(s): Levin Erp; Eugenie Filler  Supervising Physician: Daryll Brod  Patient Status:  [facility] - In-pt  Chief Complaint: Follow-up right and left biliary drains  Subjective:  Patient laying flat in bed, states he feels dizzy so they are keeping him here for now. He reports good appetite still, states he ate several boxes of cereal this morning. He denies any pain currently. Denies any issues with his drains that he is aware of.  Allergies: Morphine and related  Medications: Prior to Admission medications   Medication Sig Start Date End Date Taking? Authorizing Provider  aspirin EC 81 MG tablet Take 81 mg by mouth daily.   Yes [provider]  calcium carbonate (TUMS - DOSED IN MG ELEMENTAL CALCIUM) 500 MG chewable tablet Chew 1 tablet by mouth 2 (two) times daily as needed for indigestion or heartburn.   Yes [provider]  celecoxib (CELEBREX) 200 MG capsule Take 200 mg by mouth at bedtime.  10/10/17  Yes [provider]  fexofenadine (ALLEGRA) 60 MG tablet Take 60 mg by mouth 2 (two) times daily.   Yes [provider]  gabapentin (NEURONTIN) 400 MG capsule Take 400-800 mg by mouth See admin instructions. Take 400 mg by mouth in the morning and 800 mg in the evening   Yes [provider]  lactose free nutrition (BOOST PLUS) LIQD Take 237 mLs by mouth 3 (three) times daily with meals. 12/11/17  Yes Regalado, Belkys A, MD  Multiple Vitamin (MULTIVITAMIN) tablet Take 1 tablet by mouth daily.   Yes [provider]  ondansetron (ZOFRAN) 8 MG tablet Take 1 tablet (8 mg total) by mouth 2 (two) times daily as needed. Start on the third day after chemotherapy. 11/06/17  Yes [name], MD  prochlorperazine (COMPAZINE) 10 MG tablet Take 1 tablet (10 mg total) by mouth every 6 (six) hours as needed (Nausea or vomiting). Patient not taking: Reported on 12/22/2017 11/06/17   [name], MD     Vital Signs: BP  106/73 (BP Location: Left Arm)   Pulse 96   Temp 97.8 F (36.6 C) (Oral)   Resp 17   Ht 5' 11"  (1.803 m)   Wt 210 lb (95.3 kg)   SpO2 100%   BMI 29.29 kg/m   Physical Exam Vitals signs and nursing note reviewed.  Constitutional:      General: He is not in acute distress. HENT:     Head: Normocephalic and atraumatic.  Cardiovascular:     Rate and Rhythm: Normal rate and regular rhythm.  Pulmonary:     Effort: Pulmonary effort is normal.     Breath sounds: Normal breath sounds.  Abdominal:     Palpations: Abdomen is soft.     Tenderness: There is no abdominal tenderness.     Comments: Right and left biliary drains both to gravity, bags fastened to right lower extremity - scant clear bilious drain in both. Both insertion sites are clean, dry, intact.   Skin:    General: Skin is warm and dry.  Neurological:     Mental Status: He is alert. Mental status is at baseline.     Imaging: No results found.  Labs:  CBC: Recent Labs    12/24/17 0636 12/25/17 0609 12/26/17 0500 12/27/17 0632  WBC 5.2 5.5 4.8 4.5  HGB 7.8* 7.4* 7.3* 7.9*  HCT 24.6* 23.9* 23.2* 25.3*  PLT 131* 122* 99* 89*    COAGS: Recent Labs    12/03/17  8984 12/09/17 0613 12/22/17 1004 12/23/17 0452  INR 1.51 1.29 1.15 1.18    BMP: Recent Labs    12/24/17 0636 12/25/17 0609 12/26/17 0500 12/27/17 0632  NA 127* 133* 133* 132*  K 3.8 3.3* 3.6 3.6  CL 99 105 101 103  CO2 18* 21* 19* 20*  GLUCOSE 149* 165* 124* 139*  BUN 23 22 13 14   CALCIUM 7.8* 8.4* 8.2* 8.5*  CREATININE 1.25* 0.63 0.55* 0.56*  GFRNONAA >60 >60 >60 >60  GFRAA >60 >60 >60 >60    LIVER FUNCTION TESTS: Recent Labs    12/24/17 0636 12/25/17 0609 12/26/17 0500 12/27/17 0632  BILITOT 5.6* 4.3* 3.7* 3.1*  AST 65* 46* 40 40  ALT 80* 70* 56* 50*  ALKPHOS 305* 239* 225* 249*  PROT 6.4* 6.4* 6.4* 6.5  ALBUMIN 2.2* 2.3* 2.2* 2.4*    Assessment and Plan:  S/p right biliary drain initially placed 10/26/17 by Dr.  Earleen Newport, exchanged 11/07/17, capped 12/09/17, uncapped 12/22/17 due to worsening bilirubin; left biliary drain placed 12/23/17 by Dr. Laurence Ferrari.  Both drains remain stable, clear bilious OP from both - 400 cc from right and 925 cc from left. Culture from left drain (+) for e.coli and candida albicans - currently on meropenem and diflucan per primary team. Tmax 97.9. Last CBC 12/21 - WBC 4.5, hgb 7.9, plt 89. Last CMP 12/21 - t.bili improved to 3.1,  AST 40, ALT 50, ALP 249.   Continue current management. IR will continue to follow.  Please call with questions or concerns.   Electronically Signed: Joaquim Nam, PA-C 12/29/2017, 12:53 PM   I spent a total of 15 Minutes at the the patient's bedside AND on the patient's hospital floor or unit, greater than 50% of which was counseling/coordinating care for left and right biliary drain f/up

## 2017-12-29 NOTE — Care Management Important Message (Signed)
Important Message  Patient Details  Name: Matthew Lewis MRN: [id number] Date of Birth: [birth year]   Medicare Important Message Given:  Yes    Kerin Salen 12/29/2017, 10:01 AMImportant Message  Patient Details  Name: Matthew Lewis MRN: [id number] Date of Birth: 1955/02/01   Medicare Important Message Given:  Yes    Kerin Salen 12/29/2017, 10:01 AM

## 2017-12-29 NOTE — Progress Notes (Signed)
Matthew Lewis   DOB:1955-02-24   [id number]   OXB#:[id number]  Oncology f/u   Subjective: Patient has been treated for E coli bacteriemia since last week, his overall condition has improved some, total bilirubin has been trending down.  He still is lays in the bed most the time, do this in the chair for a while, has not walked in the hallway today, due to the mild dizziness when he stands up.  Both biliary drainage tubes are working well, no other new complaint.  Objective:  Vitals:   12/29/17 0445 12/29/17 1425  BP: 106/73 107/68  Pulse: 96 95  Resp: 17   Temp: 97.8 F (36.6 C) 98.3 F (36.8 C)  SpO2: 100% 100%    Body mass index is 29.29 kg/m.  Intake/Output Summary (Last 24 hours) at 12/29/2017 1806 Last data filed at 12/29/2017 1428 Gross per 24 hour  Intake 780 ml  Output 2250 ml  Net -1470 ml     Sclerae (+) jaundice   Oropharynx clear  No peripheral adenopathy  Lungs clear -- no rales or rhonchi  Heart regular rate and rhythm  Abdomen soft, (+) biliary drain tube at right side   MSK no focal spinal tenderness, no peripheral edema  Neuro nonfocal   CBG (last 3)  Recent Labs    12/28/17 1723  GLUCAP 122*     Labs:  Lab Results  Component Value Date   WBC 4.5 12/27/2017   HGB 7.9 (L) 12/27/2017   HCT 25.3 (L) 12/27/2017   MCV 91.7 12/27/2017   PLT 89 (L) 12/27/2017   NEUTROABS 2.6 12/27/2017   CMP Latest Ref Rng & Units 12/27/2017 12/26/2017 12/25/2017  Glucose 70 - 99 mg/dL 139(H) 124(H) 165(H)  BUN 8 - 23 mg/dL 14 13 22   Creatinine 0.61 - 1.24 mg/dL 0.56(L) 0.55(L) 0.63  Sodium 135 - 145 mmol/L 132(L) 133(L) 133(L)  Potassium 3.5 - 5.1 mmol/L 3.6 3.6 3.3(L)  Chloride 98 - 111 mmol/L 103 101 105  CO2 22 - 32 mmol/L 20(L) 19(L) 21(L)  Calcium 8.9 - 10.3 mg/dL 8.5(L) 8.2(L) 8.4(L)  Total Protein 6.5 - 8.1 g/dL 6.5 6.4(L) 6.4(L)  Total Bilirubin 0.3 - 1.2 mg/dL 3.1(H) 3.7(H) 4.3(H)  Alkaline Phos 38 - 126 U/L 249(H) 225(H) 239(H)  AST 15 - 41  U/L 40 40 46(H)  ALT 0 - 44 U/L 50(H) 56(H) 70(H)     Urine Studies No results for input(s): UHGB, CRYS in the last 72 hours.  Invalid input(s): UACOL, UAPR, USPG, UPH, UTP, UGL, UKET, UBIL, UNIT, UROB, [place], UEPI, UWBC, Junie Panning [place], [place], [place]  Basic Metabolic Panel: Recent Labs  Lab 12/23/17 0452 12/24/17 0636 12/25/17 0609 12/26/17 0500 12/27/17 0632  NA 131* 127* 133* 133* 132*  K 3.9 3.8 3.3* 3.6 3.6  CL 102 99 105 101 103  CO2 23 18* 21* 19* 20*  GLUCOSE 178* 149* 165* 124* 139*  BUN 13 23 22 13 14   CREATININE 0.52* 1.25* 0.63 0.55* 0.56*  CALCIUM 8.5* 7.8* 8.4* 8.2* 8.5*  MG  --   --  2.0 1.6* 1.8   GFR Estimated Creatinine Clearance: 109.9 mL/min (A) (by C-G formula based on SCr of 0.56 mg/dL (L)). Liver Function Tests: Recent Labs  Lab 12/23/17 0452 12/24/17 0636 12/25/17 0609 12/26/17 0500 12/27/17 0632  AST 86* 65* 46* 40 40  ALT 112* 80* 70* 56* 50*  ALKPHOS 419* 305* 239* 225* 249*  BILITOT 3.2* 5.6* 4.3* 3.7* 3.1*  PROT 6.9 6.4*  6.4* 6.4* 6.5  ALBUMIN 2.5* 2.2* 2.3* 2.2* 2.4*   No results for input(s): LIPASE, AMYLASE in the last 168 hours. No results for input(s): AMMONIA in the last 168 hours. Coagulation profile Recent Labs  Lab 12/23/17 0452  INR 1.18    CBC: Recent Labs  Lab 12/23/17 0452 12/24/17 0636 12/25/17 0609 12/26/17 0500 12/27/17 0632  WBC 5.4 5.2 5.5 4.8 4.5  NEUTROABS  --  4.6 4.0 3.0 2.6  HGB 8.5* 7.8* 7.4* 7.3* 7.9*  HCT 27.7* 24.6* 23.9* 23.2* 25.3*  MCV 93.0 93.9 93.7 93.5 91.7  PLT 256 131* 122* 99* 89*   Cardiac Enzymes: No results for input(s): CKTOTAL, CKMB, CKMBINDEX, TROPONINI in the last 168 hours. BNP: Invalid input(s): POCBNP CBG: Recent Labs  Lab 12/25/17 2002 12/26/17 0803 12/26/17 1120 12/26/17 1729 12/28/17 1723  GLUCAP 245* 108* 185* 129* 122*   D-Dimer No results for input(s): DDIMER in the last 72 hours. Hgb A1c No results for input(s): HGBA1C in the last 72 hours. Lipid  Profile No results for input(s): CHOL, HDL, LDLCALC, TRIG, CHOLHDL, LDLDIRECT in the last 72 hours. Thyroid function studies No results for input(s): TSH, T4TOTAL, T3FREE, THYROIDAB in the last 72 hours.  Invalid input(s): FREET3 Anemia work up No results for input(s): VITAMINB12, FOLATE, FERRITIN, TIBC, IRON, RETICCTPCT in the last 72 hours. Microbiology Recent Results (from the past 240 hour(s))  Culture, Urine     Status: None   Collection Time: 12/22/17  5:26 PM  Result Value Ref Range Status   Specimen Description   Final    URINE, RANDOM Performed at [facility] [address], [place], [place] [address]    Special Requests   Final    NONE Performed at [facility], [place] [address] [place], [place] [address]    Culture   Final    NO GROWTH Performed at [facility], [place] [address] [place], [place] [address]    Report Status 12/24/2017 FINAL  Final  Culture, blood (Routine X 2) w Reflex to ID Panel     Status: Abnormal   Collection Time: 12/23/17  4:11 PM  Result Value Ref Range Status   Specimen Description   Final    BLOOD LEFT ARM Performed at [facility] [address], [place], [place] [address]    Special Requests   Final    BOTTLES DRAWN AEROBIC AND ANAEROBIC Blood Culture adequate volume Performed at [facility] [address] [place], [place] [address]    Culture  Setup Time   Final    GRAM NEGATIVE RODS AEROBIC BOTTLE ONLY CRITICAL RESULT CALLED TO, READ BACK BY AND VERIFIED WITH: E. WILLIAMSON, PHARMD (WL) AT 0915 ON 12/24/17 BY C. JESSUP, MLT.    Culture (A)  Final    ESCHERICHIA COLI SUSCEPTIBILITIES PERFORMED ON PREVIOUS CULTURE WITHIN THE LAST 5 DAYS. Performed at [facility], [place] [address], [place], [place] [address]    Report Status 12/27/2017 FINAL  Final  Culture, blood (Routine X 2) w Reflex to ID Panel     Status: Abnormal   Collection Time:  12/23/17  4:12 PM  Result Value Ref Range Status   Specimen Description   Final    BLOOD RIGHT ARM Performed at [facility] [address] [place], [place] [address]    Special Requests   Final    BOTTLES DRAWN AEROBIC AND ANAEROBIC Blood Culture results may not be  optimal due to an inadequate volume of blood received in culture bottles Performed at [facility] [address], [place], [place] [address]    Culture  Setup Time   Final    GRAM NEGATIVE RODS AEROBIC BOTTLE ONLY CRITICAL RESULT CALLED TO, READ BACK BY AND VERIFIED WITH: E. WILLIAMSON, PHARMD (WL) AT 0915 ON 12/24/17 BY C. JESSUP, MLT. Performed at [facility], [place] [address] [place], [place] [address]    Culture (A)  Final    ESCHERICHIA COLI Confirmed Extended Spectrum Beta-Lactamase Producer (ESBL).  In bloodstream infections from ESBL organisms, carbapenems are preferred over piperacillin/tazobactam. They are shown to have a lower risk of mortality.    Report Status 12/27/2017 FINAL  Final   Organism ID, Bacteria ESCHERICHIA COLI  Final      Susceptibility   Escherichia coli - MIC*    AMPICILLIN >=32 RESISTANT Resistant     CEFAZOLIN >=64 RESISTANT Resistant     CEFEPIME RESISTANT Resistant     CEFTAZIDIME >=64 RESISTANT Resistant     CEFTRIAXONE >=64 RESISTANT Resistant     CIPROFLOXACIN 1 SENSITIVE Sensitive     GENTAMICIN <=1 SENSITIVE Sensitive     IMIPENEM <=0.25 SENSITIVE Sensitive     TRIMETH/SULFA <=20 SENSITIVE Sensitive     AMPICILLIN/SULBACTAM >=32 RESISTANT Resistant     PIP/TAZO >=128 RESISTANT Resistant     Extended ESBL POSITIVE Resistant     * ESCHERICHIA COLI  Blood Culture ID Panel (Reflexed)     Status: Abnormal   Collection Time: 12/23/17  4:12 PM  Result Value Ref Range Status   Enterococcus species NOT DETECTED NOT DETECTED Final   Listeria monocytogenes NOT DETECTED NOT DETECTED Final   Staphylococcus species NOT DETECTED NOT DETECTED  Final   Staphylococcus aureus (BCID) NOT DETECTED NOT DETECTED Final   Streptococcus species NOT DETECTED NOT DETECTED Final   Streptococcus agalactiae NOT DETECTED NOT DETECTED Final   Streptococcus pneumoniae NOT DETECTED NOT DETECTED Final   Streptococcus pyogenes NOT DETECTED NOT DETECTED Final   Acinetobacter baumannii NOT DETECTED NOT DETECTED Final   Enterobacteriaceae species DETECTED (A) NOT DETECTED Final    Comment: Enterobacteriaceae represent a large family of gram-negative bacteria, not a single organism. CRITICAL RESULT CALLED TO, READ BACK BY AND VERIFIED WITH: E. WILLIAMSON, PHARMD (WL) AT 0915 ON 12/24/17 BY C. JESSUP, MLT.    Enterobacter cloacae complex NOT DETECTED NOT DETECTED Final   Escherichia coli DETECTED (A) NOT DETECTED Final    Comment: CRITICAL RESULT CALLED TO, READ BACK BY AND VERIFIED WITH: E. WILLIAMSON, PHARMD (WL) AT 0915 ON 12/24/17 BY C. JESSUP, MLT.    Klebsiella oxytoca NOT DETECTED NOT DETECTED Final   Klebsiella pneumoniae NOT DETECTED NOT DETECTED Final   Proteus species NOT DETECTED NOT DETECTED Final   Serratia marcescens NOT DETECTED NOT DETECTED Final   Carbapenem resistance NOT DETECTED NOT DETECTED Final   Haemophilus influenzae NOT DETECTED NOT DETECTED Final   Neisseria meningitidis NOT DETECTED NOT DETECTED Final   Pseudomonas aeruginosa NOT DETECTED NOT DETECTED Final   Candida albicans NOT DETECTED NOT DETECTED Final   Candida glabrata NOT DETECTED NOT DETECTED Final   Candida krusei NOT DETECTED NOT DETECTED Final   Candida parapsilosis NOT DETECTED NOT DETECTED Final   Candida tropicalis NOT DETECTED NOT DETECTED Final    Comment: Performed at [facility], [place] [address] [place], [place] [address]  Body fluid culture     Status: None   Collection  Time: 12/23/17 11:52 PM  Result Value Ref Range Status   Specimen Description   Final    BILE Performed at [facility] [address], [place],  [place] [address]    Special Requests   Final    Normal Performed at [facility], [place] [address] [place], [place] [address]    Gram Stain   Final    MODERATE WBC PRESENT, PREDOMINANTLY PMN RARE GRAM POSITIVE COCCI RARE GRAM NEGATIVE RODS RARE YEAST Performed at [facility], [place] [address] [place], [place] [address]    Culture RARE ESCHERICHIA COLI FEW CANDIDA ALBICANS   Final   Report Status 12/27/2017 FINAL  Final   Organism ID, Bacteria ESCHERICHIA COLI  Final      Susceptibility   Escherichia coli - MIC*    AMPICILLIN >=32 RESISTANT Resistant     CEFAZOLIN >=64 RESISTANT Resistant     CEFEPIME <=1 SENSITIVE Sensitive     CEFTAZIDIME >=64 RESISTANT Resistant     CEFTRIAXONE >=64 RESISTANT Resistant     CIPROFLOXACIN 1 SENSITIVE Sensitive     GENTAMICIN <=1 SENSITIVE Sensitive     IMIPENEM 1 SENSITIVE Sensitive     TRIMETH/SULFA <=20 SENSITIVE Sensitive     AMPICILLIN/SULBACTAM >=32 RESISTANT Resistant     PIP/TAZO >=128 RESISTANT Resistant     Extended ESBL NEGATIVE Sensitive     * RARE ESCHERICHIA COLI  Culture, blood (routine x 2)     Status: None (Preliminary result)   Collection Time: 12/26/17  8:49 AM  Result Value Ref Range Status   Specimen Description   Final    BLOOD LEFT HAND Performed at [facility] [address] [place], [place] [address]    Special Requests   Final    BOTTLES DRAWN AEROBIC AND ANAEROBIC Blood Culture adequate volume Performed at [facility] [address] [place], [place] [address]    Culture   Final    NO GROWTH 3 DAYS Performed at [facility], [place] [address], [place], [place] [address]    Report Status PENDING  Incomplete  Culture, blood (routine x 2)     Status: None (Preliminary result)   Collection Time: 12/26/17  8:49 AM  Result Value Ref Range Status   Specimen Description   Final    BLOOD LEFT HAND Performed at [facility]  [address] [place], [place] [address]    Special Requests   Final    BOTTLES DRAWN AEROBIC ONLY Blood Culture results may not be optimal due to an inadequate volume of blood received in culture bottles Performed at [facility] [address] [place], [place] [address]    Culture   Final    NO GROWTH 3 DAYS Performed at [facility], [place] [address], [place],  [address]    Report Status PENDING  Incomplete      Studies:  No results found.  Assessment: 62 y.o. with recently diagnosed metastatic cholangiocarcinoma, status post percutaneous biliary draining tube placement and exchange, first cycle chemotherapy last week, presented with urinary difficulty, constipation, and decreased oral intake.  1.  E. coli bacteremia  2. urinary difficulty and constipation, resolved now  3. Poor oral intake, dehydration, secondary to chemotherapy and his underlying malignancy, improved some  4.  Metastatic cholangiocarcinoma, status post first cycle chemotherapy cisplatin and gemcitabine on 12/18/17 5.  Obstructive jaundice, secondary to #4, recently biliary tube exchange by  IR, worsening hyperbilirubinemia, s/p left biliary drain tube placement 12/17, jaundice improving slowly  6. Deconditioning  7. Malnutrition  8. Code status: DNR/DNI   Plan: -he is on meropenem, which was started on December 24, 2017, plan to complete a 2 weeks course -will hold chemo before he completes the course of antibiotics -Contact IR to see if we can His right side very draining tube.  I suspect his recurrent E. coli bacteremia is probably related to the bile duct infection -I encourage him to gradually increase his physical activities -Patient states that he may go home tomorrow, I will cancel his chemotherapy which was scheduled for later this week, and reschedule through the week of January 6 -we have previously discussed palliative care alone vs more chemo, pt wants to try more chemo,  not ready for hospice yet.    [name], MD 12/29/2017  6:06 PM

## 2017-12-29 NOTE — Progress Notes (Signed)
Nutrition Follow-up  DOCUMENTATION CODES:   Non-severe (moderate) malnutrition in context of chronic illness  INTERVENTION:   -Continue Ensure Max BID, each provides 150 kcal and 30g protein. -D/c Glucerna shakes  NUTRITION DIAGNOSIS:   Moderate Malnutrition related to chronic illness, cancer and cancer related treatments as evidenced by moderate muscle depletion, mild fat depletion, percent weight loss.  Ongoing.  GOAL:   Patient will meet greater than or equal to 90% of their needs  Progressing.  MONITOR:   PO intake, Supplement acceptance, Labs, Weight trends, I & O's  ASSESSMENT:   62 year old gentleman history of intrahepatic cholangiocarcinoma with metastases to the liver and lung on chemotherapy last chemo therapy 4 days prior to admission, prior history of alcohol use with cirrhosis, status post percutaneous biliary drainage tube for biliary obstruction, recent hospitalization December 03, 2017 to December 11, 2017 for septic shock secondary to ESBL E. coli bacteremia status post 2 weeks of IV antibiotics presented to the ED with inability to urinate and constipation.  Patient noted to be dehydrated on admission.  Oncology had recommended IR evaluation for biliary drainage/stent placement to the left due to rising bilirubin levels.  Patient continues to eat well, PO 100% of meals. Pt was ordered Ensure Max after conversation with RN on 12/20 regarding pt's blood sugars increasing. Per documentation, seems pt prefers Ensure Max over Glucerna shakes. Will continue Ensure Max supplements at this time.  Per weight records, no new weight has been measured since admission.   Medications: Multivitamin with minerals daily, Senokot-s tablet BID,  Labs reviewed: CBGs: 122 Low Na  Diet Order:   Diet Order            Diet regular Room service appropriate? Yes; Fluid consistency: Thin  Diet effective now              EDUCATION NEEDS:   No education needs have been  identified at this time  Skin:  Skin Assessment: Reviewed RN Assessment  Last BM:  12/20  Height:   Ht Readings from Last 1 Encounters:  12/22/17 5\' 11"  (1.803 m)    Weight:   Wt Readings from Last 1 Encounters:  12/22/17 95.3 kg    Ideal Body Weight:  78.18 kg  BMI:  Body mass index is 29.29 kg/m.  Estimated Nutritional Needs:   Kcal:  2400-2600  Protein:  115-125g  Fluid:  2.2L/day  Clayton Bibles, MS, RD, LDN [facility] Dietitian Pager: [phone] After Hours Pager: [phone]

## 2017-12-30 ENCOUNTER — Telehealth: Payer: Self-pay | Admitting: Hematology

## 2017-12-30 DIAGNOSIS — R7881 Bacteremia: Secondary | ICD-10-CM | POA: Diagnosis not present

## 2017-12-30 DIAGNOSIS — C7970 Secondary malignant neoplasm of unspecified adrenal gland: Secondary | ICD-10-CM | POA: Diagnosis not present

## 2017-12-30 MED ORDER — MEROPENEM IV (FOR PTA / DISCHARGE USE ONLY): 1.0000 g | Freq: Three times a day (TID) | INTRAVENOUS | 0 refills | Status: AC

## 2017-12-30 MED ORDER — TAMSULOSIN HCL 0.4 MG PO CAPS: 0.4000 mg | ORAL_CAPSULE | Freq: Every day | ORAL | 0 refills | Status: DC

## 2017-12-30 MED ORDER — FLUCONAZOLE 100 MG PO TABS: 200.0000 mg | ORAL_TABLET | Freq: Every day | ORAL | 0 refills | Status: AC

## 2017-12-30 NOTE — Discharge Summary (Signed)
Physician Discharge Summary  Patient ID: Matthew Lewis MRN: [id number] DOB/AGE: 1955-03-22 62 y.o.  Admit date: 12/22/2017 Discharge date: 12/30/2017  Admission Diagnoses:  Discharge Diagnoses:  Principal Problem:   Dehydration Active Problems:   Hypertension   Hyperlipemia   Diabetes mellitus ([place])   CAD (coronary artery disease)- PCI 2001- no evaluation since   Moderate protein-calorie malnutrition (HCC)   Alcoholic cirrhosis of liver without ascites (HCC)   Biliary obstruction due to malignant neoplasm (HCC)   Transaminitis   Malignant neoplasm metastatic to adrenal gland (HCC)   Intrahepatic cholangiocarcinoma (HCC)   Cholangiocarcinoma (HCC)   Bladder outlet obstruction   Constipation   Discharged Condition: stable  Hospital Course: Patient is a 62 year old male with history of intrahepatic cholangiocarcinoma with metastasis to the liver and lung, on palliative chemotherapy; prior history of alcohol abuse with cirrhosis, status post percutaneous biliary drainage for biliary obstruction, recent hospitalization from December 03, 2017 to December 11, 2017 for septic shock secondary to ESBL E. coli bacteremia status post 2 weeks of IV antibiotics.  Patient presented on 12/22/2017, and work-up revealed E. coli bacteremia.  Apparently, patient also had urinary difficulties on presentation, as well as constipation.  Patient was noted to be volume depleted on presentation.  Patient was admitted for further assessment and management.  Patient was managed with IV antibiotics (meropenem) during the hospital stay.  Patient's case was discussed with infectious disease, and 2 weeks of antibiotics was advised.  Patient will need to complete course of antibiotics.  Patient has a port in place.  Infectious disease advised that the port should not be removed at this time.  Patient underwent percutaneous biliary drain placement by interventional radiology team on 12/23/2017.  Oncology team  assisted in directing patient's care.  Patient will follow with oncology team on discharge.  Palliative care also consulted on patient during the hospital stay.  Patient is eager to be discharged back home.  Patient feels that he is back to his normal self.  Patient will be discharged back on to the care of the primary care provider and oncology team.    E. coli bacteremia: -Patient had temperature spike on 12/23/2017 and blood culture is positive for E. coli.  Patient had recent ESBL E. coli bacteremia which was treated with IV antibiotics. -2 weeks of IV meropenem advised.   -Follow repeat cultures.   -Bile culture was positive for yeast.  Diflucan started.    Complete course.  Follow LFTs    Dehydration: -Treated with IV fluids.  - Improved.   - Tolerating diet.  Constipation: -Improved -Continue bowel regimen  Urinary retention status post Foley catheter placement: -Foley catheter was removed on 12/24/2017.   Patient is urinating well as per the patient.   Flomax added.   Consider outpatient urology evaluation.  Intrahepatic cholangiocarcinoma: -On palliative chemotherapy.   -Oncology following. -Overall prognosis is poor with recurrent bacteremia.   -Palliative care.  Moderate malnutrition: -Continue to manage expectantly. -Dietitian input is highly appreciated.  Chronic anemia from anemia of chronic disease: -Hemoglobin stable.   -Transfuse packed red blood cells as needed for hemoglobin less than 7 g/dL.    Hypokalemia: -Repleted.  Hypomagnesemia: -Repleted.   -Continue to monitor magnesium and potassium level.    History of hypertension: -Blood pressure stable, and currently on the lower side.   -Continue to monitor.   Diabetes mellitus type 2: -A1c was 4.9 in November 2019.   -Continued blood sugar monitoring and sliding scale insulin coverage during the  hospital stay.  C  Alcoholic liver cirrhosis: -Stable.   -No signs of worsening  ascites  Consults: ID and hematology/oncology/IR/palliative care team  Significant Diagnostic Studies:  Blood culture grew E. coli.  Discharge Exam: Blood pressure 111/63, pulse 88, temperature 98.4 F (36.9 C), temperature source Oral, resp. rate 16, height 5' 11"  (1.803 m), weight 95.3 kg, SpO2 96 %.  Disposition: Discharge disposition: 06-Home-Health Care Svc   Discharge Instructions    Diet - low sodium heart healthy   Complete by:  As directed    Home infusion instructions [facility] May follow [place] Dosing Protocol; May administer Cathflo as needed to maintain patency of vascular access device.; Flushing of vascular access device: per [facility] Protocol: 0.9% NaCl pre/post medica...   Complete by:  As directed    Instructions:  May follow [place] Dosing Protocol   Instructions:  May administer Cathflo as needed to maintain patency of vascular access device.   Instructions:  Flushing of vascular access device: per [facility] Protocol: 0.9% NaCl pre/post medication administration and prn patency; Heparin 100 u/ml, 62m for implanted ports and Heparin 10u/ml, 560mfor all other central venous catheters.   Instructions:  May follow [facility] Anaphylaxis Protocol for First Dose Administration in the home: 0.9% NaCl at 25-50 ml/hr to maintain IV access for protocol meds. Epinephrine 0.3 ml IV/IM PRN and Benadryl 25-50 IV/IM PRN s/s of anaphylaxis.   Instructions:  [facility] Coord[facility] (RN) to assist per patient IV care needs in the home PRN.   Increase activity slowly   Complete by:  As directed      Allergies as of 12/30/2017      Reactions   Morphine And Related Other (See Comments)   Patient is recovering from drug addiction and wants to avoid any narcotics      Medication List    STOP taking these medications   calcium carbonate 500 MG chewable tablet Commonly known as:  TUMS - dosed in mg elemental calcium   prochlorperazine 10 MG tablet Commonly known  as:  COMPAZINE     TAKE these medications   aspirin EC 81 MG tablet Take 81 mg by mouth daily.   celecoxib 200 MG capsule Commonly known as:  CELEBREX Take 200 mg by mouth at bedtime.   fexofenadine 60 MG tablet Commonly known as:  ALLEGRA Take 60 mg by mouth 2 (two) times daily.   fluconazole 100 MG tablet Commonly known as:  DIFLUCAN Take 2 tablets (200 mg total) by mouth daily for 10 days.   gabapentin 400 MG capsule Commonly known as:  NEURONTIN Take 400-800 mg by mouth See admin instructions. Take 400 mg by mouth in the morning and 800 mg in the evening   lactose free nutrition Liqd Take 237 mLs by mouth 3 (three) times daily with meals.   meropenem  IVPB Commonly known as:  MERREM Inject 1 g into the vein every 8 (eight) hours for 10 days. Indication:  ESBL Ecoli bacteremia Last Day of Therapy:  01/08/2018 Labs - Once weekly:  CBC/D and BMP   multivitamin tablet Take 1 tablet by mouth daily.   ondansetron 8 MG tablet Commonly known as:  ZOFRAN Take 1 tablet (8 mg total) by mouth 2 (two) times daily as needed. Start on the third day after chemotherapy.   tamsulosin 0.4 MG Caps capsule Commonly known as:  FLOMAX Take 1 capsule (0.4 mg total) by mouth daily after supper.  Home Infusion Instuctions  (From admission, onward)         Start     Ordered   12/30/17 0000  Home infusion instructions [facility] May follow [place] Dosing Protocol; May administer Cathflo as needed to maintain patency of vascular access device.; Flushing of vascular access device: per [facility] Protocol: 0.9% NaCl pre/post medica...    Question Answer Comment  Instructions May follow [place] Dosing Protocol   Instructions May administer Cathflo as needed to maintain patency of vascular access device.   Instructions Flushing of vascular access device: per [facility] Protocol: 0.9% NaCl pre/post medication administration and prn patency; Heparin 100 u/ml, 78m for implanted  ports and Heparin 10u/ml, 576mfor all other central venous catheters.   Instructions May follow [facility] Anaphylaxis Protocol for First Dose Administration in the home: 0.9% NaCl at 25-50 ml/hr to maintain IV access for protocol meds. Epinephrine 0.3 ml IV/IM PRN and Benadryl 25-50 IV/IM PRN s/s of anaphylaxis.   Instructions [facility] Infusion Coordinator (RN) to assist per patient IV care needs in the home PRN.      12/30/17 1103           Signed: SyBonnell Public2/24/2019, 1:56 PM

## 2017-12-30 NOTE — Progress Notes (Signed)
Referring Physician(s): Levin Erp; Eugenie Filler  Supervising Physician: Sandi Mariscal  Patient Status:  [facility] - In-pt  Chief Complaint: Follow up right and left biliary drains  Subjective:  Patient states he feels ok, not very talkative today.   Allergies: Morphine and related  Medications: Prior to Admission medications   Medication Sig Start Date End Date Taking? Authorizing Provider  aspirin EC 81 MG tablet Take 81 mg by mouth daily.   Yes [provider]  calcium carbonate (TUMS - DOSED IN MG ELEMENTAL CALCIUM) 500 MG chewable tablet Chew 1 tablet by mouth 2 (two) times daily as needed for indigestion or heartburn.   Yes [provider]  celecoxib (CELEBREX) 200 MG capsule Take 200 mg by mouth at bedtime.  10/10/17  Yes [provider]  fexofenadine (ALLEGRA) 60 MG tablet Take 60 mg by mouth 2 (two) times daily.   Yes [provider]  gabapentin (NEURONTIN) 400 MG capsule Take 400-800 mg by mouth See admin instructions. Take 400 mg by mouth in the morning and 800 mg in the evening   Yes [provider]  lactose free nutrition (BOOST PLUS) LIQD Take 237 mLs by mouth 3 (three) times daily with meals. 12/11/17  Yes Regalado, Belkys A, MD  Multiple Vitamin (MULTIVITAMIN) tablet Take 1 tablet by mouth daily.   Yes [provider]  ondansetron (ZOFRAN) 8 MG tablet Take 1 tablet (8 mg total) by mouth 2 (two) times daily as needed. Start on the third day after chemotherapy. 11/06/17  Yes [name], MD  meropenem (MERREM) IVPB Inject 1 g into the vein every 8 (eight) hours for 10 days. Indication:  ESBL Ecoli bacteremia Last Day of Therapy:  01/08/2018 Labs - Once weekly:  CBC/D and BMP 12/30/17 01/09/18  Bonnell Public, MD  prochlorperazine (COMPAZINE) 10 MG tablet Take 1 tablet (10 mg total) by mouth every 6 (six) hours as needed (Nausea or vomiting). Patient not taking: Reported on 12/22/2017 11/06/17   [name],  MD     Vital Signs: BP 111/63 (BP Location: Left Arm)   Pulse 88   Temp 98.4 F (36.9 C) (Oral)   Resp 16   Ht 5' 11"  (1.803 m)   Wt 210 lb (95.3 kg)   SpO2 96%   BMI 29.29 kg/m   Physical Exam Vitals signs and nursing note reviewed.  Constitutional:      General: He is not in acute distress. HENT:     Head: Normocephalic and atraumatic.  Cardiovascular:     Rate and Rhythm: Normal rate.  Pulmonary:     Effort: Pulmonary effort is normal.  Abdominal:     General: There is no distension.     Tenderness: There is no abdominal tenderness.     Comments: Right and left biliary drains both to gravity, bags fastened to right lower extremity - clear bilious OP in both. Both insertion sites are clean, dry, intact.    Skin:    General: Skin is warm and dry.  Neurological:     Mental Status: He is alert. Mental status is at baseline.     Imaging: No results found.  Labs:  CBC: Recent Labs    12/24/17 0636 12/25/17 0609 12/26/17 0500 12/27/17 0632  WBC 5.2 5.5 4.8 4.5  HGB 7.8* 7.4* 7.3* 7.9*  HCT 24.6* 23.9* 23.2* 25.3*  PLT 131* 122* 99* 89*    COAGS: Recent Labs    12/03/17 0953 12/09/17 3474 12/22/17  1004 12/23/17 0452  INR 1.51 1.29 1.15 1.18    BMP: Recent Labs    12/24/17 0636 12/25/17 0609 12/26/17 0500 12/27/17 0632  NA 127* 133* 133* 132*  K 3.8 3.3* 3.6 3.6  CL 99 105 101 103  CO2 18* 21* 19* 20*  GLUCOSE 149* 165* 124* 139*  BUN 23 22 13 14   CALCIUM 7.8* 8.4* 8.2* 8.5*  CREATININE 1.25* 0.63 0.55* 0.56*  GFRNONAA >60 >60 >60 >60  GFRAA >60 >60 >60 >60    LIVER FUNCTION TESTS: Recent Labs    12/24/17 0636 12/25/17 0609 12/26/17 0500 12/27/17 0632  BILITOT 5.6* 4.3* 3.7* 3.1*  AST 65* 46* 40 40  ALT 80* 70* 56* 50*  ALKPHOS 305* 239* 225* 249*  PROT 6.4* 6.4* 6.4* 6.5  ALBUMIN 2.2* 2.3* 2.2* 2.4*    Assessment and Plan:  S/p right biliary drain initially placed 10/26/17 by Dr. Earleen Newport, exchanged 11/07/17, capped 12/09/17,  uncapped 12/22/17 due to worsening bilirubin; left biliary drain placed 12/23/17 by Dr. Laurence Ferrari.  Both drains remain stable, clear bilious OP from both - 900 cc from right and 475 cc from left. Culture from left drain (+) for e.coli and candida albicans - currently on meropenem and diflucan per primary team. Tmax 97.9. Last CBC 12/21 - WBC 4.5, hgb 7.9, plt 89. Last CMP 12/21 - t.bili improved to 3.1,  AST 40, ALT 50, ALP 249.   Continue current management. IR will continue to follow.  Please call with questions or concerns.   Electronically Signed: Joaquim Nam, PA-C 12/30/2017, 11:18 AM   I spent a total of 15 Minutes at the the patient's bedside AND on the patient's hospital floor or unit, greater than 50% of which was counseling/coordinating care for right and left biliary drain follow up.

## 2017-12-30 NOTE — Telephone Encounter (Signed)
Scheduled appt per 12/23 sch message - pt is aware of appt date and time

## 2017-12-30 NOTE — Progress Notes (Signed)
Pharmacy Antibiotic Note  Matthew Lewis is a 62 y.o. male with Intrahepatic cholangiocarcinomacurrently on meropenem and fluconazole for E. coli bacteremia and yeast in bile culture. Plan is to treat with 2 wks of IV abx.  Today, 12/30/2017: - afeb - scr 0.56 on 12/21  Plan: - continue with meropenem 1 gm IV q8h - fluconazole 400 mg IV q24h per MD  _____________________________________  Height: 5\' 11"  (180.3 cm) Weight: 210 lb (95.3 kg) IBW/kg (Calculated) : 75.3  Temp (24hrs), Avg:98.5 F (36.9 C), Min:98.3 F (36.8 C), Max:98.7 F (37.1 C)  Recent Labs  Lab 12/24/17 0636 12/25/17 0609 12/26/17 0500 12/27/17 0632  WBC 5.2 5.5 4.8 4.5  CREATININE 1.25* 0.63 0.55* 0.56*    Estimated Creatinine Clearance: 109.9 mL/min (A) (by C-G formula based on SCr of 0.56 mg/dL (L)).    Allergies  Allergen Reactions  . Morphine And Related Other (See Comments)    Patient is recovering from drug addiction and wants to avoid any narcotics    Antimicrobials this admission:  12/18 meropenem >>  12/19 fluconazole >>   Microbiology results:  12/16 Ucx: NGF 12/17 BCx >> 2/4 bottles E.coli, confirmed ESBL 12/17 Bile culture: rare gr+ cocci, rar gr- rods, rare yeast - reincubated for better growth - few C.albicans and Rare Ecoli - resistant to amp, ancef, ceftaz, rocephin, Unasyn and zosyn 12/20 BCx: ngtd  Previous: 12/4: BCx: NGF 11/27 BCx: ESBL E.coli   Thank you for allowing pharmacy to be a part of this patient's care.  Lynelle Doctor 12/30/2017 7:11 AM

## 2017-12-31 DIAGNOSIS — C772 Secondary and unspecified malignant neoplasm of intra-abdominal lymph nodes: Secondary | ICD-10-CM | POA: Diagnosis not present

## 2017-12-31 DIAGNOSIS — A4151 Sepsis due to Escherichia coli [E. coli]: Secondary | ICD-10-CM | POA: Diagnosis not present

## 2017-12-31 LAB — CULTURE, BLOOD (ROUTINE X 2)
Culture: NO GROWTH
Culture: NO GROWTH
Special Requests: ADEQUATE

## 2018-01-01 NOTE — Progress Notes (Signed)
[facility]   [phone] [phone]   Clinic Follow up Note   Patient Care Team: Ramachandran, Ajith, MD as PCP - General (Internal Medicine) 01/13/2018  CHIEF COMPLAINT: F/u on metastatic cholangiocarcinoma    SUMMARY OF ONCOLOGIC HISTORY: Oncology History   Cancer Staging Intrahepatic cholangiocarcinoma (HCC) Staging form: Intrahepatic Bile Duct, AJCC 8th Edition - Clinical stage from 10/29/2017: Stage IV (cT2, cN1, pM1) - Signed by , , MD on 11/06/2017       Malignant neoplasm metastatic to adrenal gland (HCC)    Initial Diagnosis    Malignant neoplasm metastatic to adrenal gland (HCC)    10/23/2017 Imaging    ABD US IMPRESSION: Cirrhotic appearance of the liver.  2.6 cm questionable ill-defined mass versus area of focal parenchymal heterogeneity in segment 6 of the liver. If further imaging evaluation is desired, liver protocol abdominal MRI should be considered.  Splenomegaly.  Apparent thickening of the gallbladder wall, without evidence of cholelithiasis, and with negative sonographic Murphy's sign. The findings are favored to represent gallbladder wall edema secondary to systemic causes such as hyperproteinemia rather than acute cholecystitis. Please correlate clinically.     10/25/2017 Imaging    MRI IMPRESSION: 1. Advanced changes of cirrhosis. Interval development of multifocal enhancing liver lesions, which is concerning for multicentric hepatocellular carcinoma involving both lobes of liver. 2. Lesion within segment 4 B obstructs the right medial hepatic duct and left main duct resulting in moderate to marked biliary ductal dilatation within the left lobe and dome of liver. There may be partial obstruction of the right lateral duct with mild dilatation. 3. Tumor thrombus within the left branch of the portal vein likely secondary to segment 4 B tumor. 4. Bilateral adrenal metastasis and retroperitoneal  nodal metastasis. 5. Stigmata of portal venous hypertension including varices, ascites and splenomegaly. 6. These results will be called to the ordering clinician or representative by the Radiologist Assistant, and communication documented in the PACS or zVision Dashboard.    10/26/2017 Procedure    IMPRESSION: Status post formal PTC and internal/external biliary drainage.    10/27/2017 Imaging    IMPRESSION: Changes of cirrhosis. Multiple hepatic lesions are noted concerning for multifocal hepatoma or metastases. Biliary drain is in place with the tip in the distal common bile duct. There is mild intrahepatic biliary ductal dilatation. Bilateral adrenal nodules and masses, right larger than left compatible with metastases. Retroperitoneal adenopathy. Large stool burden with mild gaseous distention of the colon. Aortic atherosclerosis.     10/29/2017 Pathology Results    Diagnosis Soft Tissue Needle Core Biopsy, right adrenal mass - METASTATIC ADENOCARCINOMA, SEE COMMENT. Microscopic Comment There are malignant glands with central necrosis. Immunohistochemistry is positive for cytokeratin 20 and CDX-2. Cytokeratin 7, TTF-1 and NapsinA are negative. The morphology and immunoprofile favor a colorectal primary over pancreaticobiliary. Dr. Canacci has reviewed the case. Dr.  was notified on 10/31/2017.    11/04/2017 Procedure    Colonoscopy impression - Seton in place found on digital rectal exam. - Congested mucosa in the rectum, in the sigmoid colon and in the descending colon. - A few diminutive polyps in the rectum and in the sigmoid colon. No colon or rectal mass identified. - No specimens collected.     Intrahepatic cholangiocarcinoma (HCC)   10/27/2017 Imaging    10/27/2017 CT AP IMPRESSION: Changes of cirrhosis. Multiple hepatic lesions are noted concerning for multifocal hepatoma or metastases.  Biliary drain is in place with the tip in the distal common    bile duct. There is mild intrahepatic biliary ductal dilatation.  Bilateral adrenal nodules and masses, right larger than left compatible with metastases.  Retroperitoneal adenopathy.  Large stool burden with mild gaseous distention of the colon.  Aortic atherosclerosis.    10/29/2017 Cancer Staging    Staging form: Intrahepatic Bile Duct, AJCC 8th Edition - Clinical stage from 10/29/2017: Stage IV (cT2, cN1, pM1) - Signed by [name], MD on 11/06/2017    10/29/2017 Pathology Results    10/29/2017 Surgical Pathology Diagnosis Soft Tissue Needle Core Biopsy, right adrenal mass - METASTATIC ADENOCARCINOMA, SEE COMMENT.    11/06/2017 Initial Diagnosis    Intrahepatic cholangiocarcinoma ([place])    11/06/2017 Imaging    11/06/2017 CT AP IMPRESSION: 1. Increase in RIGHT pleural effusion. Stable masslike consolidation at the RIGHT lung base representing neoplasm versus pneumonia. 2. Stable metastatic nodule at the RIGHT lung base. 3. Infiltrative mass in the central LEFT hepatic lobe and discrete lesions in the RIGHT hepatic lobe consistent with metastatic disease versus primary hepatic biliary carcinoma. Favor cholangiocarcinoma. 4. Percutaneous drainage catheter extends the porta hepatis. Small trapped pneumothorax along the tract of the catheter at the inferolateral RIGHT lung base. 5. Stable adrenal metastasis. 6. Metastatic adenopathy in the retroperitoneum unchanged. 7. Morphologic changes in liver consistent with  cirrhosis.    11/08/2017 Imaging    11/08/2017 CT AP IMPRESSION: 1. Overall no significant interval change since the CT of 11/06/2017. Stable positioning of the percutaneous biliary drain with similar degree of biliary ductal dilatation. 2. Cirrhosis with multiple hepatic metastatic disease. Infiltrative appearing lesion in the left lobe of the liver appears to extend into the left portal vein and may represent a tumor thrombus, or bland thrombus. A  primary biliary neoplasm (cholangiocarcinoma, or metastatic disease are other considerations. 3. Retroperitoneal, mesenteric, and adrenal metastatic disease. 4. Hepatic colopathy versus less likely segmental colitis of the hepatic flexure. 5. Small right pleural effusion and bilateral lung base pulmonary masses/nodules as seen previously. 6. Tiny pockets of pneumoperitoneum lateral to the liver along the percutaneous drain, decreased since the prior CT. 7. Other findings as above.    11/20/2017 - 11/24/2017 Hospital Admission    Admit date: 11/08/2017 Admission diagnosis: fatigue Additional comments: discharged 11/24/2017    12/18/2017 -  Chemotherapy    first line chemo gemcitabine/cisplatin on days 1 and 15 every 28 days starting 12/18/17    12/22/2017 - 12/30/2017 Hospital Admission    Admit date: 12/22/2017 Admission diagnosis: Dysuria    12/22/2017 Imaging    12/22/2017 CT AP IMPRESSION: 1. Prominently distended and otherwise normal urinary bladder with mild prostatomegaly. Findings suggest acute bladder outlet obstruction. No hydronephrosis. 2. Large colorectal stool volume compatible with constipation. New mild circumferential rectal wall thickening and perirectal fat haziness suggesting stercoral colitis. No free air. No abscess. 3. Stable infiltrative cholangiocarcinoma in the central left liver lobe with associated peripheral intrahepatic biliary ductal dilatation throughout the left liver lobe. 4. Stable well-positioned internal external drain traversing the right liver lobe and decompressing the right liver biliary system. 5. Stable liver, bilateral adrenal, retroperitoneal, left mesenteric and right infrahilar nodal and left lung base metastases. No progressive metastatic disease since 12/03/2017 CT. 6. Cirrhosis. Trace perihepatic ascites, decreased. Stable moderate splenomegaly. 7.  Aortic Atherosclerosis (ICD10-I70.0).     CURRENT THERAPY first line chemo  gemcitabine/cisplatin on days 1 and 8 every 21 days started 12/18/17   INTERVAL HISTORY: Matthew Lewis is a 61 y.o. male who is here for follow-up. He was hospitalized last  month for dysuria and poor oral intake. I saw him multiple times during his hospitalization.  Today, he is here alone. He is doing well and feel better. He still has a right drainage tube. He denies fever, and states that he is eating well and has more energy. He takes naps as needed. He completed his antibiotic course. He empties his drainage tube 2-3 times a day. He tries to stay hydrated and drinks plenty of fluids.    Pertinent positives and negatives of review of systems are listed and detailed within the above HPI.  REVIEW OF SYSTEMS:   Constitutional: Denies fevers, chills or abnormal weight loss Eyes: Denies blurriness of vision Ears, nose, mouth, throat, and face: Denies mucositis or sore throat Respiratory: Denies cough, dyspnea or wheezes Cardiovascular: Denies palpitation, chest discomfort or lower extremity swelling Gastrointestinal:  Denies nausea, heartburn or change in bowel habits (+) right drainage tube Skin: Denies abnormal skin rashes Lymphatics: Denies new lymphadenopathy or easy bruising Neurological:Denies numbness, tingling or new weaknesses Behavioral/Psych: Mood is stable, no new changes  All other systems were reviewed with the patient and are negative.  MEDICAL HISTORY:  Past Medical History:  Diagnosis Date  . Abscess of anal and rectal regions    horse shoe abscess  . Alcoholic cirrhosis (HCC)   . Arthritis    "everywhere; especially in my spine"  . Cirrhosis (HCC)    STAGE 1 CIRRHOSIS PATIENT SEES DR NAMDIGAM FOR  . Colon cancer (HCC) 05/2005   TX SURGERY WITH LYMPH NODE REMOVAL  . Cough    LAST FEW WEEKS SAW DR RAMOS LUNGS MILKY SPUTUM OCC  . DM type 2 with diabetic peripheral neuropathy (HCC)    left foot  . Fatigue   . Fistula, anal    multiple  . History of substance  abuse (HCC)    last alcohol was 08/2011; last marijuania was 08/2011  . Hypertension   . PTSD (post-traumatic stress disorder)    "severe" HX OF  . Sleep deprivation     SURGICAL HISTORY: Past Surgical History:  Procedure Laterality Date  . ABSCESS DRAINAGE     "probably 15-20 so far at least; rectal"  . ANAL FISTULECTOMY  12/13/1998  . ANTERIOR FUSION CERVICAL SPINE  2010   "triple"; "did 2 surgeries on the same day in 2010 due to complications"  . CORONARY ANGIOPLASTY  2001  . detached muscle  2010   right chest; "after cervical fusion complications"  . ELBOW SURGERY  2007   "cut out part of a muscle"; left  . EXAMINATION UNDER ANESTHESIA  03/22/2005   fistula  . EXAMINATION UNDER ANESTHESIA  06/06/2011   Procedure: EXAM UNDER ANESTHESIA;  Surgeon: Faera Byerly, MD;  Location: [facility] OR;  Service: General;  Laterality: N/A;  . HEMICOLECTOMY  06/06/2005   left  . INCISION AND DRAINAGE PERIRECTAL ABSCESS  03/30/2005  . INCISION AND DRAINAGE PERIRECTAL ABSCESS  12/13/2004  . INCISION AND DRAINAGE PERIRECTAL ABSCESS  07/14/2001  . INCISION AND DRAINAGE PERIRECTAL ABSCESS  08/12/2010   horseshoe abscess; Dr Wilson  . INCISION AND DRAINAGE PERIRECTAL ABSCESS  06/06/2011   Procedure: IRRIGATION AND DEBRIDEMENT PERIRECTAL ABSCESS;  Surgeon: Faera Byerly, MD;  Location: [facility] OR;  Service: General;  Laterality: N/A;  . INCISION AND DRAINAGE PERIRECTAL ABSCESS N/A 02/24/2017   Procedure: IRRIGATION AND DEBRIDEMENT PERIRECTAL ABSCESS;  Surgeon: Ramirez, Armando, MD;  Location: [facility] OR;  Service: General;  Laterality: N/A;  . IR CHOLANGIOGRAM EXISTING TUBE  12/23/2017  . IR   CHOLANGIOGRAM EXISTING TUBE  01/02/2018  . IR EXCHANGE BILIARY DRAIN  11/07/2017  . IR EXCHANGE BILIARY DRAIN  12/09/2017  . IR IMAGING GUIDED PORT INSERTION  12/09/2017  . IR INT EXT BILIARY DRAIN WITH CHOLANGIOGRAM  10/26/2017  . IR INT EXT BILIARY DRAIN WITH CHOLANGIOGRAM  12/23/2017  . IR PATIENT EVAL TECH 0-60 MINS  12/02/2017  . IR  RADIOLOGIST EVAL & MGMT  11/06/2017  . IR RADIOLOGIST EVAL & MGMT  01/05/2018  . LUMBAR DISC SURGERY  1987  . PERCUTANEOUS PINNING PHALANX FRACTURE OF HAND  ~ 2008   "plates in 2 places"; right  . PLACEMENT OF SETON N/A 09/05/2017   Procedure: PLACEMENT OF SETON;  Surgeon: Thomas, Alicia, MD;  Location: [facility];  Service: General;  Laterality: N/A;  . RADIOLOGY WITH ANESTHESIA N/A 10/25/2017   Procedure: MRI WITH ANESTHESIA;  Surgeon: Radiologist, Medication, MD;  Location: [facility] OR;  Service: Radiology;  Laterality: N/A;  . THORACIC DISCECTOMY  1990's    I have reviewed the social history and family history with the patient and they are unchanged from previous note.  ALLERGIES:  is allergic to morphine and related.  MEDICATIONS:  Current Outpatient Medications  Medication Sig Dispense Refill  . aspirin EC 81 MG tablet Take 81 mg by mouth daily.    . celecoxib (CELEBREX) 200 MG capsule Take 200 mg by mouth at bedtime.   5  . fexofenadine (ALLEGRA) 60 MG tablet Take 60 mg by mouth 2 (two) times daily.    . gabapentin (NEURONTIN) 400 MG capsule Take 400-800 mg by mouth See admin instructions. Take 400 mg by mouth in the morning and 800 mg in the evening    . lactose free nutrition (BOOST PLUS) LIQD Take 237 mLs by mouth 3 (three) times daily with meals. 30 Can 0  . Multiple Vitamin (MULTIVITAMIN) tablet Take 1 tablet by mouth daily.    . ondansetron (ZOFRAN) 8 MG tablet Take 1 tablet (8 mg total) by mouth 2 (two) times daily as needed. Start on the third day after chemotherapy. 30 tablet 1  . tamsulosin (FLOMAX) 0.4 MG CAPS capsule Take 1 capsule (0.4 mg total) by mouth daily after supper. 30 capsule 0   No current facility-administered medications for this visit.     PHYSICAL EXAMINATION: ECOG PERFORMANCE STATUS: 1 - Symptomatic but completely ambulatory  Vitals:   01/13/18 1134  BP: 129/87  Pulse: (!) 116  Resp: 20  Temp: (!) 97.5 F (36.4 C)  SpO2: 100%    Filed Weights   01/13/18 1134  Weight: 193 lb 9.6 oz (87.8 kg)    GENERAL:alert, no distress and comfortable SKIN: skin color, texture, turgor are normal, no rashes or significant lesions EYES: normal, Conjunctiva are pink and non-injected, sclera clear OROPHARYNX:no exudate, no erythema and lips, buccal mucosa, and tongue normal  NECK: supple, thyroid normal size, non-tender, without nodularity LYMPH:  no palpable lymphadenopathy in the cervical, axillary or inguinal LUNGS: clear to auscultation and percussion with normal breathing effort HEART: regular rhythm and no murmurs and no lower extremity edema (+) tachycardia  ABDOMEN:abdomen soft, non-tender and normal bowel sounds (+0 mild erythema on abdominal drainage tube Musculoskeletal:no cyanosis of digits and no clubbing  NEURO: alert & oriented x 3 with fluent speech, no focal motor/sensory deficits  LABORATORY DATA:  I have reviewed the data as listed CBC Latest Ref Rng & Units 01/13/2018 12/27/2017 12/26/2017  WBC 4.0 - 10.5 K/uL 8.5 4.5 4.8  Hemoglobin 13.0 -   17.0 g/dL 10.6(L) 7.9(L) 7.3(L)  Hematocrit 39.0 - 52.0 % 32.9(L) 25.3(L) 23.2(L)  Platelets 150 - 400 K/uL 310 89(L) 99(L)     CMP Latest Ref Rng & Units 01/13/2018 01/05/2018 12/27/2017  Glucose 70 - 99 mg/dL 134(H) 128(H) 139(H)  BUN 8 - 23 mg/dL _0 Creatinine 0.61 - 1.24 mg/dL 0.76 0.55(L) 0.56(L)  Sodium 135 - 145 mmol/L 132(L) 131(L) 132(L)  Potassium 3.5 - 5.1 mmol/L 4.6 3.6 3.6  Chloride 98 - 111 mmol/L 101 103 103  CO2 22 - 32 mmol/L 22 19(L) 20(L)  Calcium 8.9 - 10.3 mg/dL 10.0 9.0 8.5(L)  Total Protein 6.5 - 8.1 g/dL 8.7(H) 7.9 6.5  Total Bilirubin 0.3 - 1.2 mg/dL 1.8(H) 1.9(H) 3.1(H)  Alkaline Phos 38 - 126 U/L 317(H) 347(H) 249(H)  AST 15 - 41 U/L 73(H) 50(H) 40  ALT 0 - 44 U/L 74(H) 45(H) 50(H)      RADIOGRAPHIC STUDIES: I have personally reviewed the radiological images as listed and agreed with the findings in the report. No results  found.   12/22/2017 CT AP IMPRESSION: 1. Prominently distended and otherwise normal urinary bladder with mild prostatomegaly. Findings suggest acute bladder outlet obstruction. No hydronephrosis. 2. Large colorectal stool volume compatible with constipation. New mild circumferential rectal wall thickening and perirectal fat haziness suggesting stercoral colitis. No free air. No abscess. 3. Stable infiltrative cholangiocarcinoma in the central left liver lobe with associated peripheral intrahepatic biliary ductal dilatation throughout the left liver lobe. 4. Stable well-positioned internal external drain traversing the right liver lobe and decompressing the right liver biliary system. 5. Stable liver, bilateral adrenal, retroperitoneal, left mesenteric and right infrahilar nodal and left lung base metastases. No progressive metastatic disease since 12/03/2017 CT. 6. Cirrhosis. Trace perihepatic ascites, decreased. Stable moderate splenomegaly. 7.  Aortic Atherosclerosis (ICD10-I70.0).  ASSESSMENT & PLAN:  Matthew Lewis is a 62 y.o. male with history of  1. Metastatic intrahepatic cholangiocarcinoma to right adrenal gland, liver, lungs and nodes  -Diagnosed in 10/2017. He has had biliary drainage placement twice.  He has had multiple hospital admissions for dehydration, infections -He has started first line Cisplatin and gemcitabine on 12/18/2017. Tolerating well. Chemo has been held since then due to infection and hospitalization  -He has recovered well from his recent hospitalization and infection, performance status is much improved. -Labs reviewed, CBC showed Hg 10.6. CMP and CA 19.9 pending. -He is clinically improving and is feeling better. He still has the right drainage tube, that he empties 2-3 times a day. He tries to stay hydrated. -We will proceed second cycle of cisplatin and gemcitabine later this week -Follow-up next week  2.  Obstructive jaundice, status post  bilateral percutaneous biliary drain -His bilirubin has improved -I will ask IR to see if he cape both biliary drainage tubes -he is scheduled to f/u with IR later this month   3.  Multiple episodes of bacteremia -He has had recurrent E coli bacteremia, possible related to his percutaneous biliary drain  -he has recently completed a course of antibiotics -Continue monitoring  4. Malnutrition and low oral intake -likely due to cancer and chemo. -He is improving and is eating better now   5.Liver cirrhosis secondary to alcohol and NASH -stable    6. Portal vein thrombosis -He is on aspirin, no anticoagulation due to bloody biliary drainage  7. DM, HTN  -He is onempagliflozin, glipizide, insulin, and metformin -He is also on lisinopril for HTN, HTN is controlled, BP normal  lately.  -f/u with PCP   8.H/o substance abuse and addiction -He's been clean for 6 years now -will becautious about narcoticsandbenzo.  9. Goal of care discussion, DNR/DNI  -We previously discussed code status in length.I recommend DNR/DNI,he agrees. -He understands his cancer is not curable, and the goal of therapy is to prolong his life and improve his quality of life.   Plan  -he has recovered well from his recent hospitalization for bacteremia  -We will proceed with cycle 2-day 1 cisplatin and gemcitabine on 1/10 -f/u next week with labs and flush and chemo   No problem-specific Assessment & Plan notes found for this encounter.   No orders of the defined types were placed in this encounter.  All questions were answered. The patient knows to call the clinic with any problems, questions or concerns. No barriers to learning was detected. I spent 20 minutes counseling the patient face to face. The total time spent in the appointment was 25 minutes and more than 50% was on counseling and review of test results  I, Noor Dweik am acting as scribe for Dr. [name].  I have reviewed  the above documentation for accuracy and completeness, and I agree with the above.     [name], MD 01/13/2018

## 2018-01-02 ENCOUNTER — Encounter (HOSPITAL_COMMUNITY): Payer: Self-pay | Admitting: Interventional Radiology

## 2018-01-02 ENCOUNTER — Ambulatory Visit (HOSPITAL_COMMUNITY)
Admission: RE | Admit: 2018-01-02 | Discharge: 2018-01-02 | Disposition: A | Discharge status: Home / Self Care | Payer: Medicare Other | Source: Ambulatory Visit | Attending: Interventional Radiology | Admitting: Interventional Radiology

## 2018-01-02 ENCOUNTER — Other Ambulatory Visit (HOSPITAL_COMMUNITY): Payer: Self-pay | Admitting: Interventional Radiology

## 2018-01-02 ENCOUNTER — Inpatient Hospital Stay: Payer: Medicare Other

## 2018-01-02 ENCOUNTER — Ambulatory Visit: Payer: Medicare Other | Admitting: Hematology

## 2018-01-02 ENCOUNTER — Telehealth: Payer: Self-pay | Admitting: Hematology

## 2018-01-02 DIAGNOSIS — F4310 Post-traumatic stress disorder, unspecified: Secondary | ICD-10-CM | POA: Insufficient documentation

## 2018-01-02 DIAGNOSIS — I10 Essential (primary) hypertension: Secondary | ICD-10-CM | POA: Diagnosis not present

## 2018-01-02 DIAGNOSIS — K831 Obstruction of bile duct: Secondary | ICD-10-CM | POA: Insufficient documentation

## 2018-01-02 DIAGNOSIS — E1142 Type 2 diabetes mellitus with diabetic polyneuropathy: Secondary | ICD-10-CM | POA: Insufficient documentation

## 2018-01-02 DIAGNOSIS — T85590A Other mechanical complication of bile duct prosthesis, initial encounter: Secondary | ICD-10-CM | POA: Diagnosis not present

## 2018-01-02 DIAGNOSIS — Z87891 Personal history of nicotine dependence: Secondary | ICD-10-CM | POA: Diagnosis not present

## 2018-01-02 DIAGNOSIS — Z7982 Long term (current) use of aspirin: Secondary | ICD-10-CM | POA: Insufficient documentation

## 2018-01-02 DIAGNOSIS — C221 Intrahepatic bile duct carcinoma: Secondary | ICD-10-CM

## 2018-01-02 DIAGNOSIS — K7030 Alcoholic cirrhosis of liver without ascites: Secondary | ICD-10-CM | POA: Diagnosis not present

## 2018-01-02 HISTORY — PX: IR CHOLANGIOGRAM EXISTING TUBE: IMG6040

## 2018-01-02 MED ORDER — IOPAMIDOL (ISOVUE-300) INJECTION 61%
INTRAVENOUS | Status: AC
  Administered 2018-01-02: 40 mL
  Filled 2018-01-02: qty 50

## 2018-01-02 MED ORDER — IOPAMIDOL (ISOVUE-300) INJECTION 61%
50.0000 mL | Freq: Once | INTRAVENOUS | Status: AC | PRN
  Administered 2018-01-02: 40 mL

## 2018-01-02 NOTE — Telephone Encounter (Signed)
R/s appt per 12/27 sch message - pt is aware - per patient the next available is 01/16/18

## 2018-01-02 NOTE — Progress Notes (Signed)
Patient ID: Matthew Lewis, male   DOB: [birth year], 62 y.o.   MRN: [id number]      Chief Complaint: Patient was seen in consultation today for No chief complaint on file.  at the request of Matthew Lewis  History of Present Illness: Matthew Lewis is a 62 y.o. male who had a right sided internal/external biliary drain previously and a more recent left-sided internal/external biliary drain placed 1 week ago.  Since the placement of the left-sided drain, the patient did well but over the last few days has described blood in his left-sided biliary drain bag.  He denies any gastrointestinal bleeding such as melena or hematemesis.  He does not describe clots in the bile drain bags.  He feels stable otherwise.  Past Medical History:  Diagnosis Date  . Abscess of anal and rectal regions    horse shoe abscess  . Alcoholic cirrhosis ([facility])   . Arthritis    "everywhere; especially in my spine"  . Cirrhosis ([facility])    STAGE 1 CIRRHOSIS PATIENT SEES DR NAMDIGAM FOR  . Colon cancer ([facility]) 05/2005   TX SURGERY WITH LYMPH NODE REMOVAL  . Cough    LAST FEW WEEKS SAW DR RAMOS LUNGS MILKY SPUTUM OCC  . DM type 2 with diabetic peripheral neuropathy (HCC)    left foot  . Fatigue   . Fistula, anal    multiple  . History of substance abuse ([facility])    last alcohol was 08/2011; last marijuania was 08/2011  . Hypertension   . PTSD (post-traumatic stress disorder)    "severe" HX OF  . Sleep deprivation     Past Surgical History:  Procedure Laterality Date  . ABSCESS DRAINAGE     "probably 15-20 so far at least; rectal"  . ANAL FISTULECTOMY  12/13/1998  . ANTERIOR FUSION CERVICAL SPINE  2010   "triple"; "did 2 surgeries on the same day in 4650 due to complications"  . CORONARY ANGIOPLASTY  2001  . detached muscle  2010   right chest; "after cervical fusion complications"  . ELBOW SURGERY  2007   "cut out part of a muscle"; left  . EXAMINATION UNDER ANESTHESIA  03/22/2005   fistula  . EXAMINATION UNDER  ANESTHESIA  06/06/2011   Procedure: EXAM UNDER ANESTHESIA;  Surgeon: Stark Klein, MD;  Location: [place];  Service: General;  Laterality: N/A;  . HEMICOLECTOMY  06/06/2005   left  . INCISION AND DRAINAGE PERIRECTAL ABSCESS  03/30/2005  . INCISION AND DRAINAGE PERIRECTAL ABSCESS  12/13/2004  . INCISION AND DRAINAGE PERIRECTAL ABSCESS  07/14/2001  . INCISION AND DRAINAGE PERIRECTAL ABSCESS  08/12/2010   horseshoe abscess; Dr [facility]  . INCISION AND DRAINAGE PERIRECTAL ABSCESS  06/06/2011   Procedure: IRRIGATION AND DEBRIDEMENT PERIRECTAL ABSCESS;  Surgeon: Stark Klein, MD;  Location: [place];  Service: General;  Laterality: N/A;  . INCISION AND DRAINAGE PERIRECTAL ABSCESS N/A 02/24/2017   Procedure: IRRIGATION AND DEBRIDEMENT PERIRECTAL ABSCESS;  Surgeon: Matthew Ok, MD;  Location: [place];  Service: General;  Laterality: N/A;  . IR CHOLANGIOGRAM EXISTING TUBE  12/23/2017  . IR CHOLANGIOGRAM EXISTING TUBE  01/02/2018  . IR EXCHANGE BILIARY DRAIN  11/07/2017  . IR EXCHANGE BILIARY DRAIN  12/09/2017  . IR IMAGING GUIDED PORT INSERTION  12/09/2017  . IR INT EXT BILIARY DRAIN WITH CHOLANGIOGRAM  10/26/2017  . IR INT EXT BILIARY DRAIN WITH CHOLANGIOGRAM  12/23/2017  . IR PATIENT EVAL TECH 0-60 MINS  12/02/2017  . IR RADIOLOGIST EVAL & MGMT  11/06/2017  . [place]  . PERCUTANEOUS PINNING PHALANX FRACTURE OF HAND  ~ 2008   "plates in 2 places"; right  . PLACEMENT OF SETON N/A 09/05/2017   Procedure: PLACEMENT OF SETON;  Surgeon: Matthew Ruff, MD;  Location: [facility];  Service: General;  Laterality: N/A;  . RADIOLOGY WITH ANESTHESIA N/A 10/25/2017   Procedure: MRI WITH ANESTHESIA;  Surgeon: Radiologist, Medication, MD;  Location: [facility];  Service: Radiology;  Laterality: N/A;  . THORACIC DISCECTOMY  1990's    Allergies: Morphine and related  Medications: Prior to Admission medications   Medication Sig Start Date End Date Taking? Authorizing Provider  aspirin EC 81  MG tablet Take 81 mg by mouth daily.    [provider]  celecoxib (CELEBREX) 200 MG capsule Take 200 mg by mouth at bedtime.  10/10/17   [provider]  fexofenadine (ALLEGRA) 60 MG tablet Take 60 mg by mouth 2 (two) times daily.    [provider]  fluconazole (DIFLUCAN) 100 MG tablet Take 2 tablets (200 mg total) by mouth daily for 10 days. 12/30/17 01/09/18  Dana Allan I, MD  gabapentin (NEURONTIN) 400 MG capsule Take 400-800 mg by mouth See admin instructions. Take 400 mg by mouth in the morning and 800 mg in the evening    [provider]  lactose free nutrition (BOOST PLUS) LIQD Take 237 mLs by mouth 3 (three) times daily with meals. 12/11/17   Regalado, Belkys A, MD  meropenem (MERREM) IVPB Inject 1 g into the vein every 8 (eight) hours for 10 days. Indication:  ESBL Ecoli bacteremia Last Day of Therapy:  01/08/2018 Labs - Once weekly:  CBC/D and BMP 12/30/17 01/09/18  Matthew Public, MD  Multiple Vitamin (MULTIVITAMIN) tablet Take 1 tablet by mouth daily.    [provider]  ondansetron (ZOFRAN) 8 MG tablet Take 1 tablet (8 mg total) by mouth 2 (two) times daily as needed. Start on the third day after chemotherapy. 11/06/17   Matthew [name], MD  tamsulosin (FLOMAX) 0.4 MG CAPS capsule Take 1 capsule (0.4 mg total) by mouth daily after supper. 12/30/17   Matthew Public, MD     Family History  Problem Relation Age of Onset  . Heart disease Father   . Heart disease Mother   . Cancer Mother   . Cancer Sister   . Heart disease Brother     Social History   Socioeconomic History  . Marital status: Married    Spouse name: Not on file  . Number of children: Not on file  . Years of education: Not on file  . Highest education level: Not on file  Occupational History  . Occupation: works at [employer]  . Financial resource strain: Not on file  . Food insecurity:    Worry: Not on file    Inability: Not on file  .  Transportation needs:    Medical: Not on file    Non-medical: Not on file  Tobacco Use  . Smoking status: Former Smoker    Packs/day: 1.00    Years: 10.00    Pack years: 10.00    Types: Cigarettes    Last attempt to quit: 01/08/1991    Years since quitting: 27.0  . Smokeless tobacco: Never Used  Substance and Sexual Activity  . Alcohol use: Yes    Comment: 06/05/11 "drank enough when I was younger to last me my whole life; don't remember  when I had my last drink, maybe 2012"  . Drug use: Not Currently    Types: Oxycodone, Marijuana    Comment: 06/05/11 "pot head in my younger days; 2009 dr had me on oxycodone and valium for almost 76yr"  . Sexual activity: Not Currently  Lifestyle  . Physical activity:    Days per week: Not on file    Minutes per session: Not on file  . Stress: To some extent  Relationships  . Social connections:    Talks on phone: Not on file    Gets together: Not on file    Attends religious service: Not on file    Active member of club or organization: Not on file    Attends meetings of clubs or organizations: Not on file    Relationship status: Not on file  Other Topics Concern  . Not on file  Social History Narrative  . Not on file     Review of Systems: A 12 point ROS discussed and pertinent positives are indicated in the HPI above.  All other systems are negative.  Review of Systems  Vital Signs: There were no vitals taken for this visit.  Physical Exam  He is in no apparent distress.  He is somewhat jaundiced in appearance.  Examination of the bile ducts demonstrate that the entry sites are clean and dry.  The left bile duct drain bag is filled with bile that is reddish.  There are no clots within the bile drain nor the bag.   Imaging: Ct Abdomen Pelvis W Contrast  Result Date: 12/22/2017 CLINICAL DATA:  Intrahepatic cholangiocarcinoma, stage IV, with adrenal and retroperitoneal nodal metastases, diagnosed October 2019. Status post internal  external percutaneous biliary drainage. Abdominal pain. Unable to micturate. Constipation. EXAM: CT ABDOMEN AND PELVIS WITH CONTRAST TECHNIQUE: Multidetector CT imaging of the abdomen and pelvis was performed using the standard protocol following bolus administration of intravenous contrast. CONTRAST:  157mL ISOVUE-300 IOPAMIDOL (ISOVUE-300) INJECTION 61% COMPARISON:  12/03/2017 CT abdomen/pelvis. FINDINGS: Lower chest: Solid irregular 3.0 cm left lower lobe lung mass (series 6/image 38), stable. Bandlike consolidation in the basilar right lower lobe base slightly decreased, favoring evolving postinfectious/postinflammatory scarring. Coronary atherosclerosis. Tip of right sided Port-A-Cath is seen at the cavoatrial junction. Enlarged 1.8 cm right infrahilar node is stable since 10/27/2017 chest CT. Hepatobiliary: Liver surface is diffusely irregular, compatible with hepatic cirrhosis. Ill-defined infiltrative 5.8 x 4.8 cm hypoenhancing central left liver lobe mass (series 2/image 19), previously 5.5 x 5.0 cm on 12/03/2017 CT using similar measurement technique, not appreciably changed. Stable moderate intrahepatic biliary ductal dilatation throughout the left lower lobe peripheral to this mass. Multiple (at least 5) liver masses scattered throughout the periphery of the liver have not appreciably changed, largest 3.6 cm in the anterior superior liver (series 2/image 13) and 3.5 cm in the far inferior right liver lobe (series 2/image 32). Stable position of percutaneous internal external biliary drain traversing the right liver lobe and terminating within the descending duodenal lumen, with no significant intrahepatic biliary ductal dilatation in the right liver lobe. Expected pneumobilia within the nondependent gallbladder, which otherwise appears normal with no radiopaque cholelithiasis. Normal caliber common bile duct. Pancreas: Normal, with no mass or duct dilation. Spleen: Stable moderate splenomegaly with  craniocaudal splenic length 17.0 cm. No splenic mass. Adrenals/Urinary Tract: Stable bilateral adrenal masses measuring 5.1 cm on the right and 2.5 cm on the left. No hydronephrosis. Nonobstructing 2 mm lower left renal stone. No renal masses. Prominently distended  and otherwise normal urinary bladder. Stomach/Bowel: Normal non-distended stomach. Normal caliber small bowel with no small bowel wall thickening. Normal appendix. Stable postsurgical changes from partial left colectomy. Large amount of stool throughout remnant colon and rectum. New mild circumferential rectal wall thickening with slight perirectal fat haziness. Rectal diameter 7.7 cm. No definite rectal pneumatosis or perirectal free air. No additional sites of large bowel wall thickening. Vascular/Lymphatic: Atherosclerotic nonaneurysmal abdominal aorta. Patent main and right portal, splenic, hepatic and renal veins. 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 Stable aortocaval adenopathy up to 1.8 cm (series 2/image 42). Stable enlarged 1.4 cm left para-aortic node (series 2/image 42). Stable enlarged 1.9 cm left mesenteric node (series 2/image 61). No new pathologically enlarged lymph nodes in the abdomen or pelvis. Reproductive: Mildly enlarged prostate. Other: No pneumoperitoneum. No focal fluid collection. Trace perihepatic ascites, decreased. Musculoskeletal: No aggressive appearing focal osseous lesions. Marked thoracolumbar spondylosis with stable bilateral L5 pars defects. IMPRESSION: 1. Prominently distended and otherwise normal urinary bladder with mild prostatomegaly. Findings suggest acute bladder outlet obstruction. No hydronephrosis. 2. Large colorectal stool volume compatible with constipation. New mild circumferential rectal wall thickening and perirectal fat haziness suggesting stercoral colitis. No free air. No abscess. 3. Stable infiltrative cholangiocarcinoma in the central left liver lobe with associated peripheral intrahepatic  biliary ductal dilatation throughout the left liver lobe. 4. Stable well-positioned internal external drain traversing the right liver lobe and decompressing the right liver biliary system. 5. Stable liver, bilateral adrenal, retroperitoneal, left mesenteric and right infrahilar nodal and left lung base metastases. No progressive metastatic disease since 12/03/2017 CT. 6. Cirrhosis. Trace perihepatic ascites, decreased. Stable moderate splenomegaly. 7.  Aortic Atherosclerosis (ICD10-I70.0). Electronically Signed   By: Ilona Sorrel M.D.   On: 12/22/2017 15:01   Ct Abdomen Pelvis W Contrast  Result Date: 12/03/2017 CLINICAL DATA:  62 y/o M; history of intrahepatic cholangiocarcinoma presenting with sepsis from gram-negative bacteremia. Suspected biliary infection. Biliary drain in situ. EXAM: CT ABDOMEN AND PELVIS WITH CONTRAST TECHNIQUE: Multidetector CT imaging of the abdomen and pelvis was performed using the standard protocol following bolus administration of intravenous contrast. CONTRAST:  161mL OMNIPAQUE IOHEXOL 300 MG/ML  SOLN COMPARISON:  11/08/2017 CT abdomen and pelvis. FINDINGS: Lower chest: Stable round lung base mass spanning approximately 5.1 x 3.8 cm in left lung base 2.8 cm pulmonary nodule. Hepatobiliary: Liver cirrhosis. Internal external biliary drain is present traversing the right lobe of the liver, common bile duct, with pigtail in the second segment of the duodenum is stable in position from the prior CT of abdomen and pelvis. No associated rim enhancing abscess identified. Mild interval increase in perihepatic ascites. Stable ill-defined mass within the left liver hilum measuring approximately 5.1 x 2.2 cm and multiple additional hepatic metastases measuring up to 3.7 cm. Stable intrahepatic biliary ductal dilatation throughout the left lobe of liver. 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 Pancreas: Unremarkable. No pancreatic ductal dilatation or surrounding  inflammatory changes. Spleen: Normal in size without focal abnormality. Adrenals/Urinary Tract: Stable bilateral adrenal metastasis. Left kidney lower Matthew 3 mm nonobstructing kidney stone is stable. No focal kidney lesion. No hydronephrosis. Normal bladder. Stomach/Bowel: No obstructive or inflammatory changes of the stomach, small bowel, or large bowel identified. Stable partial colectomy with patent anastomoses. Vascular/Lymphatic: Calcific aortic atherosclerosis. Stable upper abdominal retroperitoneal, periportal, and gastrohepatic lymphadenopathy. Reproductive: Prostate calcifications. Other: Postsurgical changes within the anterior abdominal wall with scarring from prior surgery. No hernia. Stable left lower abdominal mesenteric mass measuring 3.5  x 2.2 cm. Musculoskeletal: No fracture is seen. Stable chronic L5 pars defects and grade 1 L5-S1 anterolisthesis. Stable spondylosis of the spine with disc and facet degenerative changes greatest at L4-5 and L5-S1. IMPRESSION: 1. Stable position of internal external biliary drain. Stable intrahepatic biliary ductal dilatation of the left lobe. Stable left liver hilar mass and multiple hepatic metastases. No abscess identified. 2. Mild increase in upper abdominal ascites. 3. Stable pulmonary, adrenal, mesenteric, and retroperitoneal lymph node metastasis. Electronically Signed   By: Kristine Garbe M.D.   On: 12/03/2017 18:05   Dg Chest Port 1 View  Result Date: 12/03/2017 CLINICAL DATA:  Encounter for central line placement. EXAM: PORTABLE CHEST 1 VIEW 2:55 p.m. COMPARISON:  Chest x-ray dated 12/03/2017 at 10:16 a.m. FINDINGS: New right jugular vein catheter tip is at the cavoatrial junction 4.8 cm below the carina. No pneumothorax. Heart size and vascularity are normal. 2.5 cm mass at the left lung base posteriorly is barely visible on this radiograph. The ill-defined mass at the right lung base is poorly seen on this radiograph. IMPRESSION: Central  venous catheter appears in good position. No acute abnormality. Faintly visible masses at both lung bases as previously demonstrated. Electronically Signed   By: Lorriane Shire M.D.   On: 12/03/2017 15:20   Dg Abd Acute W/chest  Result Date: 12/22/2017 CLINICAL DATA:  Metastatic cancer with constipation EXAM: DG ABDOMEN ACUTE W/ 1V CHEST COMPARISON:  12/03/2017 FINDINGS: Formed stool distends the colon diffusely. No small bowel dilatation or evident pneumoperitoneum. Percutaneous biliary catheter in unremarkable position. Porta catheter with tip at the upper cavoatrial junction. There is no edema, consolidation, effusion, or pneumothorax. Normal heart size IMPRESSION: Generalized stool retention consistent with history of constipation. Electronically Signed   By: Monte Fantasia M.D.   On: 12/22/2017 10:00   Ir Cholangiogram Existing Tube  Result Date: 01/02/2018 INDICATION: Bloody bilious output. EXAM: TUBE INJECTION AND CHOLANGIOGRAM MEDICATIONS: None ANESTHESIA/SEDATION: None FLUOROSCOPY TIME:  Fluoroscopy Time:  minutes 12 seconds (36 mGy). COMPLICATIONS: None immediate. PROCEDURE: Informed written consent was obtained from the patient after a thorough discussion of the procedural risks, benefits and alternatives. All questions were addressed. Maximal Sterile Barrier Technique was utilized including caps, mask, sterile gowns, sterile gloves, sterile drape, hand hygiene and skin antiseptic. A timeout was performed prior to the initiation of the procedure. Contrast was injected into the left-sided biliary drain and imaging was performed. Additional injection with digital subtraction imaging was performed. FINDINGS: The drain is well positioned within the left-sided biliary ducts. There are no filling defects within the biliary system to suggest blood clots. The tip of the catheter is coiled in the duodenum. A right-sided biliary drain is also well positioned and in place with its tip coiled in the  duodenum. The cholangiogram demonstrates patency of the biliary system through the bilateral biliary internal external drains. There is narrowing of the central right and left hepatic ducts. There is no contrast opacification of portal venous branches, hepatic vein branches, or hepatic arteries. IMPRESSION: The left internal external biliary drain is positioned well and functioning normally without evidence of complication. Electronically Signed   By: Marybelle Killings M.D.   On: 01/02/2018 12:51   Ir Cholangiogram Existing Tube  Result Date: 12/23/2017 INDICATION: 62 year old male with metastatic cholangiocarcinoma, jaundice, elevated bilirubin and new onset high-grade fever concerning for acute cholangitis. He has a previously placed percutaneous biliary drain via the right system. However, on CT imaging he has persistent obstruction of the left biliary system  secondary to a central hepatic mass. The left system is currently undrained. Additionally, the right anterior system is poorly drained. Therefore, he presents for placement of a left-sided biliary drain and also a drain injection of the existing right-sided drainage catheter to make sure it is not clogged or malpositioned. EXAM: 1. Percutaneous transhepatic cholangiogram 2. Placement of an internal/external percutaneous biliary drain via the left 3. Cholangiogram through existing catheter via the existing right percutaneous biliary drain MEDICATIONS: 1 g Meropenem; The antibiotic was administered within an appropriate time frame prior to the initiation of the procedure. ANESTHESIA/SEDATION: Moderate (conscious) sedation was employed during this procedure. A total of Versed 4 mg and Fentanyl 25 mcg was administered intravenously. Moderate Sedation Time: 26 minutes. The patient's level of consciousness and vital signs were monitored continuously by radiology nursing throughout the procedure under my direct supervision. FLUOROSCOPY TIME:  Fluoroscopy Time: 6  minutes 0 seconds (216 mGy). COMPLICATIONS: None immediate. PROCEDURE: Informed written consent was obtained from the patient after a thorough discussion of the procedural risks, benefits and alternatives. All questions were addressed. Maximal Sterile Barrier Technique was utilized including caps, mask, sterile gowns, sterile gloves, sterile drape, hand hygiene and skin antiseptic. A timeout was performed prior to the initiation of the procedure. The mid epigastric region was interrogated with ultrasound. There is biliary ductal dilatation. A suitable peripheral biliary radicle was identified. Local anesthesia was attained by infiltration with 1% lidocaine. A small dermatotomy was made. Under real-time sonographic guidance, the bile duct was punctured using a 22 gauge Chiba needle. There was return of purulent appearing bile. A percutaneous transhepatic cholangiogram was performed. There is diffuse dilatation of the left intrahepatic ducts with complete occlusion of the left main hepatic duct. A Nitrex wire was carefully advanced into the left ductal system. The Chiba needle was removed and the 4 [place] hydrophilic transitional sheath advanced over the wire and positioned in the central left hepatic ducts. A glidewire was then successfully navigated through the occlusion and into the common bile duct. The sheath was advanced of the common bile duct. The glidewire was exchanged for an Amplatz wire which was advanced into the duodenum. Given the relatively long common bile duct and the peripheral access site in the left ductal system, a 10 [place] biliary drain was modified with additional sideholes. The percutaneous tract was then dilated to 10 [place]. The modified 10 [place] biliary drain was then advanced over the wire and formed in the duodenum. The most proximal side hole is at the site of the biliary duct entry. This is visible on the images and marked by the tip of a Bentson wire. The catheter was connected to  gravity bag drainage and secured to the skin with 0 Prolene suture. Attention was turned to the existing right percutaneous biliary drain. Contrast was injected under fluoroscopy. The drain is patent and functioning normally. No further intervention performed on this side. IMPRESSION: 1. Successful placement of a new left-sided internal/external percutaneous biliary drain. The drain is 10 [place] and modified with additional sideholes given the length of the occluded segment. 2. Cholangiogram through existing tube on the right demonstrates a normally functioning right-sided percutaneous biliary drainage catheter. Electronically Signed   By: Jacqulynn Cadet M.D.   On: 12/23/2017 18:18   Ir Int Lianne Cure Biliary Drain With Cholangiogram  Result Date: 12/23/2017 INDICATION: 62 year old male with metastatic cholangiocarcinoma, jaundice, elevated bilirubin and new onset high-grade fever concerning for acute cholangitis. He has a previously placed percutaneous biliary drain via the right  system. However, on CT imaging he has persistent obstruction of the left biliary system secondary to a central hepatic mass. The left system is currently undrained. Additionally, the right anterior system is poorly drained. Therefore, he presents for placement of a left-sided biliary drain and also a drain injection of the existing right-sided drainage catheter to make sure it is not clogged or malpositioned. EXAM: 1. Percutaneous transhepatic cholangiogram 2. Placement of an internal/external percutaneous biliary drain via the left 3. Cholangiogram through existing catheter via the existing right percutaneous biliary drain MEDICATIONS: 1 g Meropenem; The antibiotic was administered within an appropriate time frame prior to the initiation of the procedure. ANESTHESIA/SEDATION: Moderate (conscious) sedation was employed during this procedure. A total of Versed 4 mg and Fentanyl 25 mcg was administered intravenously. Moderate Sedation Time:  26 minutes. The patient's level of consciousness and vital signs were monitored continuously by radiology nursing throughout the procedure under my direct supervision. FLUOROSCOPY TIME:  Fluoroscopy Time: 6 minutes 0 seconds (216 mGy). COMPLICATIONS: None immediate. PROCEDURE: Informed written consent was obtained from the patient after a thorough discussion of the procedural risks, benefits and alternatives. All questions were addressed. Maximal Sterile Barrier Technique was utilized including caps, mask, sterile gowns, sterile gloves, sterile drape, hand hygiene and skin antiseptic. A timeout was performed prior to the initiation of the procedure. The mid epigastric region was interrogated with ultrasound. There is biliary ductal dilatation. A suitable peripheral biliary radicle was identified. Local anesthesia was attained by infiltration with 1% lidocaine. A small dermatotomy was made. Under real-time sonographic guidance, the bile duct was punctured using a 22 gauge Chiba needle. There was return of purulent appearing bile. A percutaneous transhepatic cholangiogram was performed. There is diffuse dilatation of the left intrahepatic ducts with complete occlusion of the left main hepatic duct. A Nitrex wire was carefully advanced into the left ductal system. The Chiba needle was removed and the 4 [place] hydrophilic transitional sheath advanced over the wire and positioned in the central left hepatic ducts. A glidewire was then successfully navigated through the occlusion and into the common bile duct. The sheath was advanced of the common bile duct. The glidewire was exchanged for an Amplatz wire which was advanced into the duodenum. Given the relatively long common bile duct and the peripheral access site in the left ductal system, a 10 [place] biliary drain was modified with additional sideholes. The percutaneous tract was then dilated to 10 [place]. The modified 10 [place] biliary drain was then advanced over the  wire and formed in the duodenum. The most proximal side hole is at the site of the biliary duct entry. This is visible on the images and marked by the tip of a Bentson wire. The catheter was connected to gravity bag drainage and secured to the skin with 0 Prolene suture. Attention was turned to the existing right percutaneous biliary drain. Contrast was injected under fluoroscopy. The drain is patent and functioning normally. No further intervention performed on this side. IMPRESSION: 1. Successful placement of a new left-sided internal/external percutaneous biliary drain. The drain is 10 [place] and modified with additional sideholes given the length of the occluded segment. 2. Cholangiogram through existing tube on the right demonstrates a normally functioning right-sided percutaneous biliary drainage catheter. Electronically Signed   By: Jacqulynn Cadet M.D.   On: 12/23/2017 18:18   Ir Exchange Biliary Drain  Result Date: 12/09/2017 INDICATION: Cholangiocarcinoma of liver, primarily within the left lobe causing biliary obstruction. Status post biliary drain placement from  a right lobe approach on 10/26/2017. Admitted with sepsis and now adequately treated for bacteremia. Assessment of bile ducts performed to determine need for internal biliary stenting and whether the external drain can be removed. EXAM: EXCHANGE OF BILIARY DRAINAGE CATHETER WITH CHOLANGIOGRAM MEDICATIONS: 1 g IV meropenem; The antibiotic was administered within an appropriate time frame prior to the initiation of the procedure. ANESTHESIA/SEDATION: Moderate (conscious) sedation was employed during this procedure. A total of Versed 3.0 mg and Fentanyl 100 mcg was administered intravenously. Moderate Sedation Time: 23 minutes. The patient's level of consciousness and vital signs were monitored continuously by radiology nursing throughout the procedure under my direct supervision. FLUOROSCOPY TIME:  Fluoroscopy Time: 3 minutes and 18 seconds.  84.8 mGy. COMPLICATIONS: None immediate. PROCEDURE: Informed written consent was obtained from the patient after a thorough discussion of the procedural risks, benefits and alternatives. All questions were addressed. Maximal Sterile Barrier Technique was utilized including caps, mask, sterile gowns, sterile gloves, sterile drape, hand hygiene and skin antiseptic. A timeout was performed prior to the initiation of the procedure. Initially, the indwelling 10 [place] biliary drainage catheter was removed over a guidewire. A 7 French sheath was advanced into the right lobe of the liver and a cholangiogram performed with visualization of biliary drainage in the right lobe as well as extrahepatic bile ducts. The sheath positioning was altered and a 5 [place] catheter also advanced parallel to the guidewire in attempting opacification of left lobe bile ducts. A new 10 French internal/external biliary drainage catheter was then advanced over a guidewire. The distal portion was formed in the duodenum. Catheter positioning was confirmed by fluoroscopy after contrast injection. The drainage catheter was flushed with saline and capped. It was secured at the skin with a Prolene retention suture. FINDINGS: Initial cholangiogram shows fairly good drainage throughout the right lobe bile ducts and extrahepatic bile ducts without evidence of significant obstruction. There is some mild narrowing of central right intrahepatic ducts near the confluence with left lobe ducts. The common bile duct is normally patent. The cystic duct is patent with reflux of contrast noted into the gallbladder. Contrast enters the duodenum normally. Left lobe ducts appear isolated and likely obstructed centrally with no ability to opacified left lobe bile ducts by right-sided cholangiogram. Injection of a 5 French catheter introduced into central bile ducts could not opacify left lobe ducts. It was evident that there is currently not a need for right-sided  permanent indwelling biliary stenting. In addition, placement of a central right-sided biliary stent would completely isolate any existing drainage of left-sided bile ducts. For this reason, the biliary drain was exchanged and will be left capped for a trial of internal drainage. Should bilirubin levels rise, there may be a need for separate percutaneous drainage of left-sided bile ducts at a different setting. IMPRESSION: 1. Cholangiogram shows good drainage throughout the right lobe bile ducts and extrahepatic bile ducts without evidence of significant obstruction. There is some degree of narrowing of central right intrahepatic bile ducts near the biliary confluence with left lobe ducts. However, there was not enough obstruction present to warrant internal stenting and stenting would also isolate left lobe bile ducts. The current right-sided internal/external biliary drainage catheter was exchanged for a new 10 French catheter. This will be initially capped for a trial of internal drainage. 2. Should bilirubin levels rise, there may be a need for an additional separate percutaneous drainage procedure of left lobe bile ducts. Electronically Signed   By: Jenness Corner.D.  On: 12/09/2017 13:39   Ir Imaging Guided Port Insertion  Result Date: 12/09/2017 CLINICAL DATA:  Cholangiocarcinoma and need for porta cath for chemotherapy. EXAM: IMPLANTED PORT A CATH PLACEMENT WITH ULTRASOUND AND FLUOROSCOPIC GUIDANCE ANESTHESIA/SEDATION: 3.0 mg IV Versed; 100 mcg IV Fentanyl Total Moderate Sedation Time:  37 minutes The patient's level of consciousness and physiologic status were continuously monitored during the procedure by Radiology nursing. Additional Medications: A scheduled dose 1 g IV meropenem with given just prior to the procedure. FLUOROSCOPY TIME:  42 seconds.  16.7 mGy. PROCEDURE: The procedure, risks, benefits, and alternatives were explained to the patient. Questions regarding the procedure were  encouraged and answered. The patient understands and consents to the procedure. A time-out was performed prior to initiating the procedure. Ultrasound was utilized to confirm patency of the right internal jugular vein. The right neck and chest were prepped with chlorhexidine in a sterile fashion, and a sterile drape was applied covering the operative field. Maximum barrier sterile technique with sterile gowns and gloves were used for the procedure. Local anesthesia was provided with 1% lidocaine. After creating a small venotomy incision, a 21 gauge needle was advanced into the right internal jugular vein under direct, real-time ultrasound guidance. Ultrasound image documentation was performed. After securing guidewire access, an 8 Fr dilator was placed. A J-wire was kinked to measure appropriate catheter length. A subcutaneous port pocket was then created along the upper chest wall utilizing sharp and blunt dissection. Portable cautery was utilized. The pocket was irrigated with sterile saline. A single lumen power injectable port was chosen for placement. The 8 Fr catheter was tunneled from the port pocket site to the venotomy incision. The port was placed in the pocket. External catheter was trimmed to appropriate length based on guidewire measurement. At the venotomy, an 8 Fr peel-away sheath was placed over a guidewire. The catheter was then placed through the sheath and the sheath removed. Final catheter positioning was confirmed and documented with a fluoroscopic spot image. The port was accessed with a needle and aspirated and flushed with heparinized saline. The access needle was removed. The venotomy and port pocket incisions were closed with subcutaneous 3-0 Monocryl and subcuticular 4-0 Vicryl. Dermabond was applied to both incisions. COMPLICATIONS: COMPLICATIONS None FINDINGS: After catheter placement, the tip lies at the cavo-atrial junction. The catheter aspirates normally and is ready for immediate  use. IMPRESSION: Placement of single lumen port a cath via right internal jugular vein. The catheter tip lies at the cavo-atrial junction. A power injectable port a cath was placed and is ready for immediate use. Electronically Signed   By: Aletta Edouard M.D.   On: 12/09/2017 14:37    Labs:  CBC: Recent Labs    12/24/17 0636 12/25/17 0609 12/26/17 0500 12/27/17 0632  WBC 5.2 5.5 4.8 4.5  HGB 7.8* 7.4* 7.3* 7.9*  HCT 24.6* 23.9* 23.2* 25.3*  PLT 131* 122* 99* 89*    COAGS: Recent Labs    12/03/17 0953 12/09/17 0613 12/22/17 1004 12/23/17 0452  INR 1.51 1.29 1.15 1.18    BMP: Recent Labs    12/24/17 0636 12/25/17 0609 12/26/17 0500 12/27/17 0632  NA 127* 133* 133* 132*  K 3.8 3.3* 3.6 3.6  CL 99 105 101 103  CO2 18* 21* 19* 20*  GLUCOSE 149* 165* 124* 139*  BUN 23 22 13 14   CALCIUM 7.8* 8.4* 8.2* 8.5*  CREATININE 1.25* 0.63 0.55* 0.56*  GFRNONAA >60 >60 >60 >60  GFRAA >60 >60 >60 >60  LIVER FUNCTION TESTS: Recent Labs    12/24/17 0636 12/25/17 0609 12/26/17 0500 12/27/17 0632  BILITOT 5.6* 4.3* 3.7* 3.1*  AST 65* 46* 40 40  ALT 80* 70* 56* 50*  ALKPHOS 305* 239* 225* 249*  PROT 6.4* 6.4* 6.4* 6.5  ALBUMIN 2.2* 2.3* 2.2* 2.4*    TUMOR MARKERS: Recent Labs    04/02/17 1346  AFPTM 2.2    Assessment and Plan:  Mr. Nettleton has had some blood within the left-sided biliary drain bag but no evidence of clots in the bag and no gastrointestinal bleeding.  Furthermore, a cholangiogram performed today demonstrates no biliary to vascular fistula or significant clot burden within the ductal system.  I suspect that the bleeding may be related to his underlying central tumor.  Does appear that it is a low level of bleeding.  It should hopefully resolve as the tract matures.  He will be followed and have a drain exchange in 1 to 2 weeks.  He was instructed to contact us immediately if he experiences bright red blood per biliary drain bag or bright red blood per  rectum or hematemesis.  Thank you for this interesting consult.  I greatly enjoyed meeting BERWYN BIGLEY and look forward to participating in their care.  A copy of this report was sent to the requesting provider on this date.  Electronically Signed: Art A Austin Pongratz, MD 01/02/2018, 12:54 PM   I spent a total of    10 Minutes in face to face in clinical consultation, greater than 50% of which was counseling/coordinating care for biliary drainage

## 2018-01-02 NOTE — Procedures (Signed)
L Int/Ext 10 Fr biliary drain injection Drain is patent without evidence of biliary to vascular fistula EBL 0 Comp 0

## 2018-01-03 ENCOUNTER — Ambulatory Visit: Payer: Medicare Other

## 2018-01-05 ENCOUNTER — Ambulatory Visit (HOSPITAL_COMMUNITY)
Admission: RE | Admit: 2018-01-05 | Discharge: 2018-01-05 | Disposition: A | Discharge status: Home / Self Care | Payer: Medicare Other | Source: Ambulatory Visit | Attending: Interventional Radiology | Admitting: Interventional Radiology

## 2018-01-05 ENCOUNTER — Other Ambulatory Visit (HOSPITAL_COMMUNITY): Payer: Self-pay | Admitting: Interventional Radiology

## 2018-01-05 ENCOUNTER — Other Ambulatory Visit: Payer: Self-pay | Admitting: Radiology

## 2018-01-05 DIAGNOSIS — A4151 Sepsis due to Escherichia coli [E. coli]: Secondary | ICD-10-CM | POA: Diagnosis not present

## 2018-01-05 DIAGNOSIS — C772 Secondary and unspecified malignant neoplasm of intra-abdominal lymph nodes: Secondary | ICD-10-CM | POA: Diagnosis not present

## 2018-01-05 DIAGNOSIS — T85590A Other mechanical complication of bile duct prosthesis, initial encounter: Secondary | ICD-10-CM | POA: Diagnosis not present

## 2018-01-05 DIAGNOSIS — Z1612 Extended spectrum beta lactamase (ESBL) resistance: Secondary | ICD-10-CM | POA: Diagnosis not present

## 2018-01-05 DIAGNOSIS — C221 Intrahepatic bile duct carcinoma: Secondary | ICD-10-CM

## 2018-01-05 HISTORY — PX: IR RADIOLOGIST EVAL & MGMT: IMG5224

## 2018-01-05 LAB — COMPREHENSIVE METABOLIC PANEL
ALT: 45 U/L — ABNORMAL HIGH (ref 0–44)
AST: 50 U/L — ABNORMAL HIGH (ref 15–41)
Albumin: 3 g/dL — ABNORMAL LOW (ref 3.5–5.0)
Alkaline Phosphatase: 347 U/L — ABNORMAL HIGH (ref 38–126)
Anion gap: 9 (ref 5–15)
BILIRUBIN TOTAL: 1.9 mg/dL — AB (ref 0.3–1.2)
BUN: 16 mg/dL (ref 8–23)
CO2: 19 mmol/L — ABNORMAL LOW (ref 22–32)
Calcium: 9 mg/dL (ref 8.9–10.3)
Chloride: 103 mmol/L (ref 98–111)
Creatinine, Ser: 0.55 mg/dL — ABNORMAL LOW (ref 0.61–1.24)
GFR calc Af Amer: >60 mL/min (ref >60)
GFR calc non Af Amer: >60 mL/min (ref >60)
GLUCOSE: 128 mg/dL — AB (ref 70–99)
Potassium: 3.6 mmol/L (ref 3.5–5.1)
Sodium: 131 mmol/L — ABNORMAL LOW (ref 135–145)
TOTAL PROTEIN: 7.9 g/dL (ref 6.5–8.1)

## 2018-01-07 DIAGNOSIS — R7881 Bacteremia: Secondary | ICD-10-CM | POA: Diagnosis not present

## 2018-01-07 DIAGNOSIS — C7970 Secondary malignant neoplasm of unspecified adrenal gland: Secondary | ICD-10-CM | POA: Diagnosis not present

## 2018-01-08 ENCOUNTER — Encounter (HOSPITAL_COMMUNITY): Payer: Self-pay | Admitting: Interventional Radiology

## 2018-01-12 DIAGNOSIS — C772 Secondary and unspecified malignant neoplasm of intra-abdominal lymph nodes: Secondary | ICD-10-CM | POA: Diagnosis not present

## 2018-01-12 DIAGNOSIS — A4151 Sepsis due to Escherichia coli [E. coli]: Secondary | ICD-10-CM | POA: Diagnosis not present

## 2018-01-12 DIAGNOSIS — Z1612 Extended spectrum beta lactamase (ESBL) resistance: Secondary | ICD-10-CM | POA: Diagnosis not present

## 2018-01-12 DIAGNOSIS — A4150 Gram-negative sepsis, unspecified: Secondary | ICD-10-CM | POA: Diagnosis not present

## 2018-01-13 ENCOUNTER — Telehealth: Payer: Self-pay

## 2018-01-13 ENCOUNTER — Encounter: Payer: Self-pay | Admitting: Hematology

## 2018-01-13 ENCOUNTER — Inpatient Hospital Stay: Payer: Medicare Other | Attending: Hematology | Admitting: Hematology

## 2018-01-13 ENCOUNTER — Inpatient Hospital Stay: Payer: Medicare Other

## 2018-01-13 VITALS — BP 129/87 | HR 116 | Temp 97.5°F | Resp 20 | Ht 71.0 in | Wt 193.6 lb

## 2018-01-13 DIAGNOSIS — I10 Essential (primary) hypertension: Secondary | ICD-10-CM | POA: Diagnosis not present

## 2018-01-13 DIAGNOSIS — K7030 Alcoholic cirrhosis of liver without ascites: Secondary | ICD-10-CM | POA: Diagnosis not present

## 2018-01-13 DIAGNOSIS — C7802 Secondary malignant neoplasm of left lung: Secondary | ICD-10-CM | POA: Insufficient documentation

## 2018-01-13 DIAGNOSIS — C7801 Secondary malignant neoplasm of right lung: Secondary | ICD-10-CM | POA: Diagnosis not present

## 2018-01-13 DIAGNOSIS — Z5111 Encounter for antineoplastic chemotherapy: Secondary | ICD-10-CM | POA: Insufficient documentation

## 2018-01-13 DIAGNOSIS — I81 Portal vein thrombosis: Secondary | ICD-10-CM | POA: Insufficient documentation

## 2018-01-13 DIAGNOSIS — Z95828 Presence of other vascular implants and grafts: Secondary | ICD-10-CM

## 2018-01-13 DIAGNOSIS — E46 Unspecified protein-calorie malnutrition: Secondary | ICD-10-CM | POA: Insufficient documentation

## 2018-01-13 DIAGNOSIS — I700 Atherosclerosis of aorta: Secondary | ICD-10-CM | POA: Diagnosis not present

## 2018-01-13 DIAGNOSIS — Z79899 Other long term (current) drug therapy: Secondary | ICD-10-CM | POA: Insufficient documentation

## 2018-01-13 DIAGNOSIS — C221 Intrahepatic bile duct carcinoma: Secondary | ICD-10-CM | POA: Diagnosis not present

## 2018-01-13 DIAGNOSIS — C7971 Secondary malignant neoplasm of right adrenal gland: Secondary | ICD-10-CM

## 2018-01-13 DIAGNOSIS — Z794 Long term (current) use of insulin: Secondary | ICD-10-CM

## 2018-01-13 DIAGNOSIS — E1142 Type 2 diabetes mellitus with diabetic polyneuropathy: Secondary | ICD-10-CM | POA: Insufficient documentation

## 2018-01-13 DIAGNOSIS — E119 Type 2 diabetes mellitus without complications: Secondary | ICD-10-CM

## 2018-01-13 LAB — CBC WITH DIFFERENTIAL (CANCER CENTER ONLY)
Abs Immature Granulocytes: 0.03 10*3/uL (ref 0.00–0.07)
Basophils Absolute: 0.1 10*3/uL (ref 0.0–0.1)
Basophils Relative: 1 %
Eosinophils Absolute: 0.8 10*3/uL — ABNORMAL HIGH (ref 0.0–0.5)
Eosinophils Relative: 9 %
HCT: 32.9 % — ABNORMAL LOW (ref 39.0–52.0)
Hemoglobin: 10.6 g/dL — ABNORMAL LOW (ref 13.0–17.0)
Immature Granulocytes: 0 %
Lymphocytes Relative: 19 %
Lymphs Abs: 1.6 10*3/uL (ref 0.7–4.0)
MCH: 28.8 pg (ref 26.0–34.0)
MCHC: 32.2 g/dL (ref 30.0–36.0)
MCV: 89.4 fL (ref 80.0–100.0)
Monocytes Absolute: 0.9 10*3/uL (ref 0.1–1.0)
Monocytes Relative: 11 %
Neutro Abs: 5.1 10*3/uL (ref 1.7–7.7)
Neutrophils Relative %: 60 %
Platelet Count: 310 10*3/uL (ref 150–400)
RBC: 3.68 MIL/uL — ABNORMAL LOW (ref 4.22–5.81)
RDW: 16.7 % — ABNORMAL HIGH (ref 11.5–15.5)
WBC Count: 8.5 10*3/uL (ref 4.0–10.5)
nRBC: 0 % (ref 0.0–0.2)

## 2018-01-13 LAB — CMP (CANCER CENTER ONLY)
ALT: 74 U/L — ABNORMAL HIGH (ref 0–44)
AST: 73 U/L — ABNORMAL HIGH (ref 15–41)
Albumin: 3.2 g/dL — ABNORMAL LOW (ref 3.5–5.0)
Alkaline Phosphatase: 317 U/L — ABNORMAL HIGH (ref 38–126)
Anion gap: 9 (ref 5–15)
BUN: 17 mg/dL (ref 8–23)
CO2: 22 mmol/L (ref 22–32)
Calcium: 10 mg/dL (ref 8.9–10.3)
Chloride: 101 mmol/L (ref 98–111)
Creatinine: 0.76 mg/dL (ref 0.61–1.24)
GFR, Est AFR Am: >60 mL/min (ref >60)
GFR, Estimated: >60 mL/min (ref >60)
GLUCOSE: 134 mg/dL — AB (ref 70–99)
Potassium: 4.6 mmol/L (ref 3.5–5.1)
Sodium: 132 mmol/L — ABNORMAL LOW (ref 135–145)
Total Bilirubin: 1.8 mg/dL — ABNORMAL HIGH (ref 0.3–1.2)
Total Protein: 8.7 g/dL — ABNORMAL HIGH (ref 6.5–8.1)

## 2018-01-13 MED ORDER — HEPARIN SOD (PORK) LOCK FLUSH 100 UNIT/ML IV SOLN
500.0000 [IU] | Freq: Once | INTRAVENOUS | Status: AC
  Administered 2018-01-13: 500 [IU] via INTRAVENOUS
  Filled 2018-01-13: qty 5

## 2018-01-13 MED ORDER — SODIUM CHLORIDE 0.9% FLUSH
10.0000 mL | Freq: Once | INTRAVENOUS | Status: AC
  Administered 2018-01-13: 10 mL via INTRAVENOUS
  Filled 2018-01-13: qty 10

## 2018-01-13 NOTE — Telephone Encounter (Signed)
Printed avs and calender of upcoming appointment. Per 1/7 los 

## 2018-01-14 ENCOUNTER — Other Ambulatory Visit (HOSPITAL_COMMUNITY): Payer: Medicare Other

## 2018-01-14 ENCOUNTER — Ambulatory Visit: Payer: Medicare Other

## 2018-01-14 ENCOUNTER — Ambulatory Visit (HOSPITAL_COMMUNITY): Payer: Medicare Other

## 2018-01-14 ENCOUNTER — Telehealth: Payer: Self-pay

## 2018-01-14 LAB — CANCER ANTIGEN 19-9: CA 19-9: 32 U/mL (ref 0–35)

## 2018-01-14 NOTE — Telephone Encounter (Signed)
Called and left a voice msg concerning upcoming appointments. Add to current scheduled approved by [facility] for 7 hr infusion. Per 1/7 los

## 2018-01-15 ENCOUNTER — Encounter (HOSPITAL_COMMUNITY): Payer: Self-pay | Admitting: Emergency Medicine

## 2018-01-15 ENCOUNTER — Emergency Department (HOSPITAL_COMMUNITY)
Admission: EM | Admit: 2018-01-15 | Discharge: 2018-01-15 | Disposition: A | Discharge status: Home / Self Care | Payer: Medicare Other | Attending: Emergency Medicine | Admitting: Emergency Medicine

## 2018-01-15 ENCOUNTER — Other Ambulatory Visit: Payer: Self-pay

## 2018-01-15 ENCOUNTER — Ambulatory Visit: Payer: Medicare Other

## 2018-01-15 DIAGNOSIS — I2510 Atherosclerotic heart disease of native coronary artery without angina pectoris: Secondary | ICD-10-CM | POA: Insufficient documentation

## 2018-01-15 DIAGNOSIS — T8132XA Disruption of internal operation (surgical) wound, not elsewhere classified, initial encounter: Secondary | ICD-10-CM | POA: Insufficient documentation

## 2018-01-15 DIAGNOSIS — Z9861 Coronary angioplasty status: Secondary | ICD-10-CM | POA: Insufficient documentation

## 2018-01-15 DIAGNOSIS — R58 Hemorrhage, not elsewhere classified: Secondary | ICD-10-CM | POA: Diagnosis present

## 2018-01-15 DIAGNOSIS — Z79899 Other long term (current) drug therapy: Secondary | ICD-10-CM | POA: Insufficient documentation

## 2018-01-15 DIAGNOSIS — Z8509 Personal history of malignant neoplasm of other digestive organs: Secondary | ICD-10-CM | POA: Insufficient documentation

## 2018-01-15 DIAGNOSIS — T8131XA Disruption of external operation (surgical) wound, not elsewhere classified, initial encounter: Secondary | ICD-10-CM | POA: Diagnosis not present

## 2018-01-15 DIAGNOSIS — I10 Essential (primary) hypertension: Secondary | ICD-10-CM | POA: Diagnosis not present

## 2018-01-15 DIAGNOSIS — Z85858 Personal history of malignant neoplasm of other endocrine glands: Secondary | ICD-10-CM | POA: Insufficient documentation

## 2018-01-15 DIAGNOSIS — Z87891 Personal history of nicotine dependence: Secondary | ICD-10-CM | POA: Diagnosis not present

## 2018-01-15 DIAGNOSIS — Z7982 Long term (current) use of aspirin: Secondary | ICD-10-CM | POA: Diagnosis not present

## 2018-01-15 DIAGNOSIS — Y828 Other medical devices associated with adverse incidents: Secondary | ICD-10-CM | POA: Diagnosis not present

## 2018-01-15 DIAGNOSIS — Z85038 Personal history of other malignant neoplasm of large intestine: Secondary | ICD-10-CM | POA: Diagnosis not present

## 2018-01-15 DIAGNOSIS — E119 Type 2 diabetes mellitus without complications: Secondary | ICD-10-CM | POA: Diagnosis not present

## 2018-01-15 LAB — COMPREHENSIVE METABOLIC PANEL
ALT: 128 U/L — ABNORMAL HIGH (ref 0–44)
AST: 119 U/L — ABNORMAL HIGH (ref 15–41)
Albumin: 3.6 g/dL (ref 3.5–5.0)
Alkaline Phosphatase: 271 U/L — ABNORMAL HIGH (ref 38–126)
Anion gap: 8 (ref 5–15)
BUN: 24 mg/dL — ABNORMAL HIGH (ref 8–23)
CO2: 22 mmol/L (ref 22–32)
Calcium: 9.2 mg/dL (ref 8.9–10.3)
Chloride: 102 mmol/L (ref 98–111)
Creatinine, Ser: 0.77 mg/dL (ref 0.61–1.24)
GFR calc non Af Amer: >60 mL/min (ref >60)
Glucose, Bld: 138 mg/dL — ABNORMAL HIGH (ref 70–99)
POTASSIUM: 4.5 mmol/L (ref 3.5–5.1)
Sodium: 132 mmol/L — ABNORMAL LOW (ref 135–145)
Total Bilirubin: 1.9 mg/dL — ABNORMAL HIGH (ref 0.3–1.2)
Total Protein: 8.8 g/dL — ABNORMAL HIGH (ref 6.5–8.1)

## 2018-01-15 LAB — CBC WITH DIFFERENTIAL/PLATELET
Abs Immature Granulocytes: 0.03 10*3/uL (ref 0.00–0.07)
Basophils Absolute: 0.1 10*3/uL (ref 0.0–0.1)
Basophils Relative: 1 %
Eosinophils Absolute: 1 10*3/uL — ABNORMAL HIGH (ref 0.0–0.5)
Eosinophils Relative: 11 %
HCT: 34.2 % — ABNORMAL LOW (ref 39.0–52.0)
Hemoglobin: 11 g/dL — ABNORMAL LOW (ref 13.0–17.0)
IMMATURE GRANULOCYTES: 0 %
Lymphocytes Relative: 13 %
Lymphs Abs: 1.2 10*3/uL (ref 0.7–4.0)
MCH: 29.2 pg (ref 26.0–34.0)
MCHC: 32.2 g/dL (ref 30.0–36.0)
MCV: 90.7 fL (ref 80.0–100.0)
Monocytes Absolute: 1 10*3/uL (ref 0.1–1.0)
Monocytes Relative: 11 %
NEUTROS PCT: 64 %
NRBC: 0 % (ref 0.0–0.2)
Neutro Abs: 5.8 10*3/uL (ref 1.7–7.7)
PLATELETS: 291 10*3/uL (ref 150–400)
RBC: 3.77 MIL/uL — ABNORMAL LOW (ref 4.22–5.81)
RDW: 16.8 % — ABNORMAL HIGH (ref 11.5–15.5)
WBC: 9.1 10*3/uL (ref 4.0–10.5)

## 2018-01-15 LAB — PROTIME-INR
INR: 1.19
Prothrombin Time: 14.9 seconds (ref 11.4–15.2)

## 2018-01-15 LAB — TYPE AND SCREEN
ABO/RH(D): A POS
Antibody Screen: NEGATIVE

## 2018-01-15 NOTE — ED Notes (Signed)
Bed: WA08 Expected date:  Expected time:  Means of arrival:  Comments: Hold for triage 2 bleeding from port.

## 2018-01-15 NOTE — ED Provider Notes (Signed)
[facility] DEPT Provider Note   CSN: [id number] Arrival date & time: 01/15/18  1249     History   Chief Complaint Chief Complaint  Patient presents with  . Abdominal Pain    HPI Matthew Lewis is a 63 y.o. male.  HPI  Patient is a 63 year old male with a history of metastatic cholangiocarcinoma, alcoholic cirrhosis, colon cancer, presenting for bleeding out of his internal-external drain site on the left side of his abdomen.  Patient had this placed in October 2019 and has been changed out twice.  He denies bleeding from the site before.  He also has a biliary drain in the right upper abdomen.  Patient denies any trauma to the site.  He reports that it occurred 30 minutes prior to arrival when he stood up while at a church event.  He reports that it soaked through his shirt and his pants.  He reports that a dressing was applied and is stopped now.  Patient does not take blood thinning medications nor antiplatelet agents.  Patient reports that when initially began he had no pain, but he has some mild abdominal tenderness around the site.  Patient denies any hematemesis, melena, or hematochezia.  No fever or chills.  Past Medical History:  Diagnosis Date  . Abscess of anal and rectal regions    horse shoe abscess  . Alcoholic cirrhosis ([place])   . Arthritis    "everywhere; especially in my spine"  . Cirrhosis ([place])    STAGE 1 CIRRHOSIS PATIENT SEES DR NAMDIGAM FOR  . Colon cancer ([place]) 05/2005   TX SURGERY WITH LYMPH NODE REMOVAL  . Cough    LAST FEW WEEKS SAW DR RAMOS LUNGS MILKY SPUTUM OCC  . DM type 2 with diabetic peripheral neuropathy (HCC)    left foot  . Fatigue   . Fistula, anal    multiple  . History of substance abuse ([place])    last alcohol was 08/2011; last marijuania was 08/2011  . Hypertension   . PTSD (post-traumatic stress disorder)    "severe" HX OF  . Sleep deprivation     Patient Active Problem List   Diagnosis Date Noted  .  Dehydration 12/22/2017  . Bladder outlet obstruction 12/22/2017  . Constipation 12/22/2017  . Bladder outflow obstruction   . Sepsis due to gram-negative bacteria ([employer])   . Severe sepsis with acute organ dysfunction due to Gram negative bacteria ([place]) 12/03/2017  . Septic shock ([place])   . Cholangiocarcinoma ([place])   . Hypotension 11/20/2017  . Acute renal failure (ARF) ([place]) 11/20/2017  . Palliative care by specialist   . Segmental colitis ([place]) 11/09/2017  . Elevated lipase 11/09/2017  . Intrahepatic cholangiocarcinoma ([place]) 11/06/2017  . Goals of care, counseling/discussion 11/06/2017  . Cirrhosis ([place])   . Malignant neoplasm metastatic to adrenal gland ([place])   . Biliary obstruction due to malignant neoplasm ([place]) 10/23/2017  . Transaminitis 10/23/2017  . Liver lesion 10/23/2017  . History of colon cancer 10/23/2017  . Perirectal abscess 02/24/2017  . Moderate protein-calorie malnutrition ([place]) 02/24/2017  . Sepsis ([place]) 02/24/2017  . Pulmonary nodule 02/24/2017  . Alcoholic cirrhosis of liver without ascites ([place]) 02/24/2017  . Duodenitis 06/13/2016  . Hyponatremia 06/12/2016  . Nausea & vomiting 06/12/2016  . Thrombocytopenia ([place]) 06/12/2016  . Acute gastroenteritis 06/12/2016  . PTSD (post-traumatic stress disorder) 12/02/2012  . Hx of substance abuse- clean several years now 12/02/2012  . Chest pain with moderate risk of acute coronary syndrome  11/30/2012  . Obesity 11/30/2012  . Hypertension 11/30/2012  . Hyperlipemia 11/30/2012  . Diabetes mellitus ([place]) 11/30/2012  . CAD (coronary artery disease)- PCI 2001- no evaluation since 11/30/2012    Past Surgical History:  Procedure Laterality Date  . ABSCESS DRAINAGE     "probably 15-20 so far at least; rectal"  . ANAL FISTULECTOMY  12/13/1998  . ANTERIOR FUSION CERVICAL SPINE  2010   "triple"; "did 2 surgeries on the same day in 3662 due to complications"  . CORONARY ANGIOPLASTY  2001  . detached muscle  2010   right  chest; "after cervical fusion complications"  . ELBOW SURGERY  2007   "cut out part of a muscle"; left  . EXAMINATION UNDER ANESTHESIA  03/22/2005   fistula  . EXAMINATION UNDER ANESTHESIA  06/06/2011   Procedure: EXAM UNDER ANESTHESIA;  Surgeon: Stark Klein, MD;  Location: [facility];  Service: General;  Laterality: N/A;  . HEMICOLECTOMY  06/06/2005   left  . INCISION AND DRAINAGE PERIRECTAL ABSCESS  03/30/2005  . INCISION AND DRAINAGE PERIRECTAL ABSCESS  12/13/2004  . INCISION AND DRAINAGE PERIRECTAL ABSCESS  07/14/2001  . INCISION AND DRAINAGE PERIRECTAL ABSCESS  08/12/2010   horseshoe abscess; Dr Redmond Pulling  . INCISION AND DRAINAGE PERIRECTAL ABSCESS  06/06/2011   Procedure: IRRIGATION AND DEBRIDEMENT PERIRECTAL ABSCESS;  Surgeon: Stark Klein, MD;  Location: [facility];  Service: General;  Laterality: N/A;  . INCISION AND DRAINAGE PERIRECTAL ABSCESS N/A 02/24/2017   Procedure: IRRIGATION AND DEBRIDEMENT PERIRECTAL ABSCESS;  Surgeon: Ralene Ok, MD;  Location: [facility];  Service: General;  Laterality: N/A;  . IR CHOLANGIOGRAM EXISTING TUBE  12/23/2017  . IR CHOLANGIOGRAM EXISTING TUBE  01/02/2018  . IR EXCHANGE BILIARY DRAIN  11/07/2017  . IR EXCHANGE BILIARY DRAIN  12/09/2017  . IR IMAGING GUIDED PORT INSERTION  12/09/2017  . IR INT EXT BILIARY DRAIN WITH CHOLANGIOGRAM  10/26/2017  . IR INT EXT BILIARY DRAIN WITH CHOLANGIOGRAM  12/23/2017  . IR PATIENT EVAL TECH 0-60 MINS  12/02/2017  . IR RADIOLOGIST EVAL & MGMT  11/06/2017  . IR RADIOLOGIST EVAL & MGMT  01/05/2018  . [place]  . PERCUTANEOUS PINNING PHALANX FRACTURE OF HAND  ~ 2008   "plates in 2 places"; right  . PLACEMENT OF SETON N/A 09/05/2017   Procedure: PLACEMENT OF SETON;  Surgeon: Leighton Ruff, MD;  Location: [facility];  Service: General;  Laterality: N/A;  . RADIOLOGY WITH ANESTHESIA N/A 10/25/2017   Procedure: MRI WITH ANESTHESIA;  Surgeon: Radiologist, Medication, MD;  Location: [facility];  Service:  Radiology;  Laterality: N/A;  . THORACIC DISCECTOMY  1990's        Home Medications    Prior to Admission medications   Medication Sig Start Date End Date Taking? Authorizing Provider  aspirin EC 81 MG tablet Take 81 mg by mouth daily.    [provider]  celecoxib (CELEBREX) 200 MG capsule Take 200 mg by mouth at bedtime.  10/10/17   [provider]  fexofenadine (ALLEGRA) 60 MG tablet Take 60 mg by mouth 2 (two) times daily.    [provider]  gabapentin (NEURONTIN) 400 MG capsule Take 400-800 mg by mouth See admin instructions. Take 400 mg by mouth in the morning and 800 mg in the evening    [provider]  lactose free nutrition (BOOST PLUS) LIQD Take 237 mLs by mouth 3 (three) times daily with meals. 12/11/17   Regalado, Cassie Freer, MD  Multiple Vitamin (MULTIVITAMIN) tablet Take 1 tablet by mouth daily.    [provider]  ondansetron (ZOFRAN) 8 MG tablet Take 1 tablet (8 mg total) by mouth 2 (two) times daily as needed. Start on the third day after chemotherapy. 11/06/17   [name], MD  tamsulosin (FLOMAX) 0.4 MG CAPS capsule Take 1 capsule (0.4 mg total) by mouth daily after supper. 12/30/17   Bonnell Public, MD    Family History Family History  Problem Relation Age of Onset  . Heart disease Father   . Heart disease Mother   . Cancer Mother   . Cancer Sister   . Heart disease Brother     Social History Social History   Tobacco Use  . Smoking status: Former Smoker    Packs/day: 1.00    Years: 10.00    Pack years: 10.00    Types: Cigarettes    Last attempt to quit: 01/08/1991    Years since quitting: 27.0  . Smokeless tobacco: Never Used  Substance Use Topics  . Alcohol use: Yes    Comment: 06/05/11 "drank enough when I was younger to last me my whole life; don't remember when I had my last drink, maybe 2012"  . Drug use: Not Currently    Types: Oxycodone, Marijuana    Comment: 06/05/11 "pot head in my younger days;  2009 dr had me on oxycodone and valium for almost 83yr     Allergies   Morphine and related   Review of Systems Review of Systems  Constitutional: Negative for chills and fever.  HENT: Negative for congestion and rhinorrhea.   Respiratory: Negative for shortness of breath.   Cardiovascular: Negative for chest pain and leg swelling.  Gastrointestinal: Positive for abdominal pain. Negative for blood in stool, nausea and vomiting.  Musculoskeletal: Negative for arthralgias and myalgias.  Skin: Positive for wound. Negative for color change.  All other systems reviewed and are negative.    Physical Exam Updated Vital Signs BP 113/80 (BP Location: Left Arm)   Pulse 99   Temp (!) 97.4 F (36.3 C) (Oral)   Resp 16   Ht 5' 11"  (1.803 m)   Wt 87.5 kg   SpO2 98%   BMI 26.92 kg/m   Physical Exam Vitals signs and nursing note reviewed.  Constitutional:      General: He is not in acute distress.    Appearance: He is well-developed. He is not ill-appearing, toxic-appearing or diaphoretic.  HENT:     Head: Normocephalic and atraumatic.  Eyes:     Conjunctiva/sclera: Conjunctivae normal.     Pupils: Pupils are equal, round, and reactive to light.  Neck:     Musculoskeletal: Normal range of motion and neck supple.  Cardiovascular:     Rate and Rhythm: Normal rate and regular rhythm.     Heart sounds: S1 normal and S2 normal. No murmur.  Pulmonary:     Effort: Pulmonary effort is normal.     Breath sounds: Normal breath sounds. No wheezing or rales.  Abdominal:     General: There is no distension.     Palpations: Abdomen is soft.     Tenderness: There is no abdominal tenderness. There is no guarding.     Comments: Patient has well-healed drain site in the left upper abdomen.  No active bleeding.  There is clotted blood underneath the Tegaderm. Biliary drain in the right flank well-healed with no evidence of obstruction.  Bile actively draining.  Musculoskeletal: Normal range  of  motion.        General: No deformity.  Lymphadenopathy:     Cervical: No cervical adenopathy.  Skin:    General: Skin is warm and dry.     Findings: No erythema or rash.  Neurological:     Mental Status: He is alert.     Comments: Cranial nerves grossly intact. Patient moves extremities symmetrically and with good coordination.  Psychiatric:        Behavior: Behavior normal.        Thought Content: Thought content normal.        Judgment: Judgment normal.      ED Treatments / Results  Labs (all labs ordered are listed, but only abnormal results are displayed) Labs Reviewed  COMPREHENSIVE METABOLIC PANEL - Abnormal; Notable for the following components:      Result Value   Sodium 132 (*)    Glucose, Bld 138 (*)    BUN 24 (*)    Total Protein 8.8 (*)    AST 119 (*)    ALT 128 (*)    Alkaline Phosphatase 271 (*)    Total Bilirubin 1.9 (*)    All other components within normal limits  CBC WITH DIFFERENTIAL/PLATELET - Abnormal; Notable for the following components:   RBC 3.77 (*)    Hemoglobin 11.0 (*)    HCT 34.2 (*)    RDW 16.8 (*)    Eosinophils Absolute 1.0 (*)    All other components within normal limits  PROTIME-INR  TYPE AND SCREEN    EKG None  Radiology No results found.  Procedures Procedures (including critical care time)  Medications Ordered in ED Medications - No data to display   Initial Impression / Assessment and Plan / ED Course  I have reviewed the triage vital signs and the nursing notes.  Pertinent labs & imaging results that were available during my care of the patient were reviewed by me and considered in my medical decision making (see chart for details).  Clinical Course as of Jan 15 1505  Thu Jan 15, 2018  1453 Spoke with Vicente Masson, PA-C who states that bleeding is positional and the drain has been repositioned and pressure dressing applied. No further imaging or orders required from IR standpoint. Appreciate their involvement.     [AM]  1506 AST(!): 119 [AM]  1506 Elevation in transaminases over the past 2 days.   ALT(!): 128 [AM]    Clinical Course User Index [AM] Albesa Seen, PA-C   Patient well-appearing and in no acute distress.  Patient with bleeding around his internal-external drain site into the right liver lobe for his cholangiocarcinoma.  This is resolved on arrival.  Patient has a nonsurgical abdomen.  No trauma.  Patient does not take blood thinning medications.  Patient has normal platelet count and normal PT/INR today.  Patient does have elevation in his transaminases from 2 days ago.  Will have patient closely follow with his primary oncology team. Patient has appointment tomorrow.   Case discussed with Dr. Pascal Lux of interventional radiology.  He states that the patient has had bleeding from his drain sites before.  He states that this is a recurring problem is patient's drain is placed directly through tumor.  Patient evaluated by interventional radiology PA, Alli Louk, PA-C who redressed and affixed the drain.  Patient is aware that he needs to flush it daily.  Return precautions given for any sudden increase in bleeding, pain, or any new or worsening symptoms.  Patient is in understanding and agrees with plan of care.  This is a supervised visit with Dr. Lennice Sites. Evaluation, management, and discharge planning discussed with this attending physician.  Final Clinical Impressions(s) / ED Diagnoses   Final diagnoses:  Disruption of tissue around surgical drain, initial encounter    ED Discharge Orders    None       Tamala Julian 01/15/18 [place], [facility], DO 01/15/18 1916

## 2018-01-15 NOTE — ED Triage Notes (Signed)
Patient reports bleeding from drain in abdomen x30 minutes. Hx liver, lung and bile duct cancer. Last chemo last month.

## 2018-01-15 NOTE — Progress Notes (Addendum)
IR paged for "bleeding drain". Patient with known left and right biliary drains placed in IR (left placed 12/23/2017 by Dr. Laurence Ferrari, right placed 10/26/2017 by Dr. Earleen Newport and replaced 11/07/2017 and 12/09/2017).  Patient states that his left drain has been bleeding around incision site. State that he is flushing Qday.  On physical exam, drain intact with suture intact, site surrounded with blood. Drain was flushed (while hanging down) and saline drainage came from inferior incision. When drain is flushed while positioned up, no saline drainage seen. Drain was repositioned with a statlock so that drain is pointing superiorly- this way there will be no drainage. Pressure dressing applied.  Bilirubin stable at 1.9 today.  Instructed patient to continue flushing Qday. Leave current dressing in place unless leakage occurs. Patient to call Juliann Pulse in IR tomorrow if any issues arise. Plan for drain injection 02/03/2018. Patient ready for discharge from our standpoint. Langston Masker, PA-C aware.  Please call IR with questions/concerns.  Bea Graff , PA-C 01/15/2018, 3:21 PM

## 2018-01-15 NOTE — Discharge Instructions (Addendum)
Please see the information and instructions below regarding your visit.  Your diagnoses today include:  1. Disruption of tissue around surgical drain, initial encounter     Tests performed today include: See side panel of your discharge paperwork for testing performed today. Vital signs are listed at the bottom of these instructions.   Medications prescribed:    Take any prescribed medications only as prescribed, and any over the counter medications only as directed on the packaging.  Home care instructions:  Please follow any educational materials contained in this packet.   Follow-up instructions: Please follow-up with your primary oncologist for recheck of your liver enzymes.   Return instructions:  Please return to the Emergency Department if you experience worsening symptoms.  Please return to the emergency department if you develop any sudden worsening of bleeding, increased pain, or any new or worsening symptoms. Please return if you have any other emergent concerns.  Additional Information:   Your vital signs today were: BP 107/77    Pulse 99    Temp (!) 97.4 F (36.3 C) (Oral)    Resp 14    Ht 5\' 11"  (1.803 m)    Wt 87.5 kg    SpO2 99%    BMI 26.92 kg/m  If your blood pressure (BP) was elevated on multiple readings during this visit above 130 for the top number or above 80 for the bottom number, please have this repeated by your primary care provider within one month. --------------  Thank you for allowing [place] to participate in your care today.

## 2018-01-16 ENCOUNTER — Inpatient Hospital Stay: Payer: Medicare Other

## 2018-01-16 VITALS — BP 119/79 | HR 100 | Temp 97.6°F | Resp 17

## 2018-01-16 DIAGNOSIS — C7802 Secondary malignant neoplasm of left lung: Secondary | ICD-10-CM | POA: Diagnosis not present

## 2018-01-16 DIAGNOSIS — C7801 Secondary malignant neoplasm of right lung: Secondary | ICD-10-CM | POA: Diagnosis not present

## 2018-01-16 DIAGNOSIS — I10 Essential (primary) hypertension: Secondary | ICD-10-CM | POA: Diagnosis not present

## 2018-01-16 DIAGNOSIS — E46 Unspecified protein-calorie malnutrition: Secondary | ICD-10-CM | POA: Diagnosis not present

## 2018-01-16 DIAGNOSIS — C221 Intrahepatic bile duct carcinoma: Secondary | ICD-10-CM

## 2018-01-16 DIAGNOSIS — C7971 Secondary malignant neoplasm of right adrenal gland: Secondary | ICD-10-CM | POA: Diagnosis not present

## 2018-01-16 DIAGNOSIS — Z79899 Other long term (current) drug therapy: Secondary | ICD-10-CM | POA: Diagnosis not present

## 2018-01-16 DIAGNOSIS — Z5111 Encounter for antineoplastic chemotherapy: Secondary | ICD-10-CM | POA: Diagnosis not present

## 2018-01-16 DIAGNOSIS — Z7189 Other specified counseling: Secondary | ICD-10-CM

## 2018-01-16 DIAGNOSIS — I81 Portal vein thrombosis: Secondary | ICD-10-CM | POA: Diagnosis not present

## 2018-01-16 DIAGNOSIS — E1142 Type 2 diabetes mellitus with diabetic polyneuropathy: Secondary | ICD-10-CM | POA: Diagnosis not present

## 2018-01-16 DIAGNOSIS — I700 Atherosclerosis of aorta: Secondary | ICD-10-CM | POA: Diagnosis not present

## 2018-01-16 MED ORDER — HEPARIN SOD (PORK) LOCK FLUSH 100 UNIT/ML IV SOLN
500.0000 [IU] | Freq: Once | INTRAVENOUS | Status: AC | PRN
  Administered 2018-01-16: 500 [IU]
  Filled 2018-01-16: qty 5

## 2018-01-16 MED ORDER — PALONOSETRON HCL INJECTION 0.25 MG/5ML
0.2500 mg | Freq: Once | INTRAVENOUS | Status: AC
  Administered 2018-01-16: 0.25 mg via INTRAVENOUS

## 2018-01-16 MED ORDER — SODIUM CHLORIDE 0.9 % IV SOLN
52.0000 mg | Freq: Once | INTRAVENOUS | Status: AC
  Administered 2018-01-16: 52 mg via INTRAVENOUS
  Filled 2018-01-16: qty 52

## 2018-01-16 MED ORDER — SODIUM CHLORIDE 0.9 % IV SOLN
Freq: Once | INTRAVENOUS | Status: AC
  Administered 2018-01-16: 08:00:00 via INTRAVENOUS
  Filled 2018-01-16: qty 250

## 2018-01-16 MED ORDER — POTASSIUM CHLORIDE 2 MEQ/ML IV SOLN
Freq: Once | INTRAVENOUS | Status: AC
  Administered 2018-01-16: 09:00:00 via INTRAVENOUS
  Filled 2018-01-16: qty 10

## 2018-01-16 MED ORDER — TRAMADOL HCL 50 MG PO TABS
ORAL_TABLET | ORAL | Status: AC
  Filled 2018-01-16: qty 1

## 2018-01-16 MED ORDER — SODIUM CHLORIDE 0.9 % IV SOLN
1672.0000 mg | Freq: Once | INTRAVENOUS | Status: AC
  Administered 2018-01-16: 1672 mg via INTRAVENOUS
  Filled 2018-01-16: qty 43.97

## 2018-01-16 MED ORDER — SODIUM CHLORIDE 0.9% FLUSH
10.0000 mL | INTRAVENOUS | Status: DC | PRN
  Administered 2018-01-16: 10 mL
  Filled 2018-01-16: qty 10

## 2018-01-16 MED ORDER — TRAMADOL HCL 50 MG PO TABS
50.0000 mg | ORAL_TABLET | Freq: Once | ORAL | Status: AC
  Administered 2018-01-16: 50 mg via ORAL

## 2018-01-16 MED ORDER — SODIUM CHLORIDE 0.9 % IV SOLN
Freq: Once | INTRAVENOUS | Status: AC
  Administered 2018-01-16: 11:00:00 via INTRAVENOUS
  Filled 2018-01-16: qty 5

## 2018-01-16 MED ORDER — PALONOSETRON HCL INJECTION 0.25 MG/5ML
INTRAVENOUS | Status: AC
  Filled 2018-01-16: qty 5

## 2018-01-16 NOTE — Progress Notes (Signed)
Per [name] okay to treat with AST/ALT results from yesterday.

## 2018-01-16 NOTE — Patient Instructions (Signed)
[facility] Discharge Instructions for Patients Receiving Chemotherapy  Today you received the following chemotherapy agents: Gemcitabine (Gemzar) and Cisplatin (Platinol)  To help prevent nausea and vomiting after your treatment, we encourage you to take your nausea medication as directed.  If you develop nausea and vomiting that is not controlled by your nausea medication, call the clinic.   BELOW ARE SYMPTOMS THAT SHOULD BE REPORTED IMMEDIATELY:  *FEVER GREATER THAN 100.5 F  *CHILLS WITH OR WITHOUT FEVER  NAUSEA AND VOMITING THAT IS NOT CONTROLLED WITH YOUR NAUSEA MEDICATION  *UNUSUAL SHORTNESS OF BREATH  *UNUSUAL BRUISING OR BLEEDING  TENDERNESS IN MOUTH AND THROAT WITH OR WITHOUT PRESENCE OF ULCERS  *URINARY PROBLEMS  *BOWEL PROBLEMS  UNUSUAL RASH Items with * indicate a potential emergency and should be followed up as soon as possible.  Feel free to call the clinic should you have any questions or concerns. The clinic phone number is [phone].  Please show the [facility] at check-in to the Emergency Department and triage nurse.

## 2018-01-16 NOTE — Progress Notes (Signed)
Initially MD planned for Gemzar 1000mg /m2 today. MD would like to increase to 800mg /m2 for today and plan for 1000mg /m2 for day 8. Orders for Gemzar decreased to 800mg /m2 for today only as requested by MD.  Hardie Pulley, PharmD, BCPS, BCOP

## 2018-01-16 NOTE — Progress Notes (Signed)
Per [name]: OK to treat with elevated AST/ALT and bilirubin   Per [name]: OK to administer pre-medications with only 123m urine output

## 2018-01-19 DIAGNOSIS — K603 Anal fistula: Secondary | ICD-10-CM | POA: Diagnosis not present

## 2018-01-20 ENCOUNTER — Other Ambulatory Visit: Payer: Self-pay | Admitting: *Deleted

## 2018-01-20 ENCOUNTER — Telehealth: Payer: Self-pay

## 2018-01-20 MED ORDER — LIDOCAINE-PRILOCAINE 2.5-2.5 % EX CREA: TOPICAL_CREAM | CUTANEOUS | 0 refills | Status: AC

## 2018-01-20 MED FILL — NORMAL SALINE FLUSH SYRINGE: 0.9 | 30 days supply | Qty: 300 | Fill #1

## 2018-01-20 MED FILL — LIDOCAINE-PRILOCAINE CREAM: 2.5-2.5 | 15 days supply | Qty: 30 | Fill #0

## 2018-01-20 NOTE — Progress Notes (Signed)
Matthew Lewis   [phone] [phone]   Clinic Follow up Note   Patient Care Team: [name], MD as PCP - General (Internal Medicine) 01/22/2018  CHIEF COMPLAINT: F/u on metastatic cholangiocarcinoma   SUMMARY OF ONCOLOGIC HISTORY: Oncology History   Cancer Staging Intrahepatic cholangiocarcinoma ([place]) Staging form: Intrahepatic Bile Duct, AJCC 8th Edition - Clinical stage from 10/29/2017: Stage IV (cT2, cN1, pM1) - Signed by [name], MD on 11/06/2017       Malignant neoplasm metastatic to adrenal gland [facility])    Initial Diagnosis    Malignant neoplasm metastatic to adrenal gland ([place])    10/23/2017 Imaging    ABD US IMPRESSION: Cirrhotic appearance of the liver.  2.6 cm questionable ill-defined mass versus area of focal parenchymal heterogeneity in segment 6 of the liver. If further imaging evaluation is desired, liver protocol abdominal MRI should be considered.  Splenomegaly.  Apparent thickening of the gallbladder wall, without evidence of cholelithiasis, and with negative sonographic Murphy's sign. The findings are favored to represent gallbladder wall edema secondary to systemic causes such as hyperproteinemia rather than acute cholecystitis. Please correlate clinically.     10/25/2017 Imaging    MRI IMPRESSION: 1. Advanced changes of cirrhosis. Interval development of multifocal enhancing liver lesions, which is concerning for multicentric hepatocellular carcinoma involving both lobes of liver. 2. Lesion within segment 4 B obstructs the right medial hepatic duct and left main duct resulting in moderate to marked biliary ductal dilatation within the left lobe and dome of liver. There may be partial obstruction of the right lateral duct with mild dilatation. 3. Tumor thrombus within the left branch of the portal vein likely secondary to segment 4 B tumor. 4. Bilateral adrenal metastasis and retroperitoneal  nodal metastasis. 5. Stigmata of portal venous hypertension including varices, ascites and splenomegaly. 6. These results will be called to the ordering clinician or representative by the Radiologist Assistant, and communication documented in the PACS or zVision Dashboard.    10/26/2017 Procedure    IMPRESSION: Status post formal PTC and internal/external biliary drainage.    10/27/2017 Imaging    IMPRESSION: Changes of cirrhosis. Multiple hepatic lesions are noted concerning for multifocal hepatoma or metastases. Biliary drain is in place with the tip in the distal common bile duct. There is mild intrahepatic biliary ductal dilatation. Bilateral adrenal nodules and masses, right larger than left compatible with metastases. Retroperitoneal adenopathy. Large stool burden with mild gaseous distention of the colon. Aortic atherosclerosis.     10/29/2017 Pathology Results    Diagnosis Soft Tissue Needle Core Biopsy, right adrenal mass - METASTATIC ADENOCARCINOMA, SEE COMMENT. Microscopic Comment There are malignant glands with central necrosis. Immunohistochemistry is positive for cytokeratin 20 and CDX-2. Cytokeratin 7, TTF-1 and NapsinA are negative. The morphology and immunoprofile favor a colorectal primary over pancreaticobiliary. [name] has reviewed the case. [name] was notified on 10/31/2017.    11/04/2017 Procedure    Colonoscopy impression - Seton in place found on digital rectal exam. - Congested mucosa in the rectum, in the sigmoid colon and in the descending colon. - A few diminutive polyps in the rectum and in the sigmoid colon. No colon or rectal mass identified. - No specimens collected.     Intrahepatic cholangiocarcinoma ([place])   10/27/2017 Imaging    10/27/2017 CT AP IMPRESSION: Changes of cirrhosis. Multiple hepatic lesions are noted concerning for multifocal hepatoma or metastases.  Biliary drain is in place with the tip in the distal common  bile  duct. There is mild intrahepatic biliary ductal dilatation.  Bilateral adrenal nodules and masses, right larger than left compatible with metastases.  Retroperitoneal adenopathy.  Large stool burden with mild gaseous distention of the colon.  Aortic atherosclerosis.    10/29/2017 Cancer Staging    Staging form: Intrahepatic Bile Duct, AJCC 8th Edition - Clinical stage from 10/29/2017: Stage IV (cT2, cN1, pM1) - Signed by [name], MD on 11/06/2017    10/29/2017 Pathology Results    10/29/2017 Surgical Pathology Diagnosis Soft Tissue Needle Core Biopsy, right adrenal mass - METASTATIC ADENOCARCINOMA, SEE COMMENT.    11/06/2017 Initial Diagnosis    Intrahepatic cholangiocarcinoma ([place])    11/06/2017 Imaging    11/06/2017 CT AP IMPRESSION: 1. Increase in RIGHT pleural effusion. Stable masslike consolidation at the RIGHT lung base representing neoplasm versus pneumonia. 2. Stable metastatic nodule at the RIGHT lung base. 3. Infiltrative mass in the central LEFT hepatic lobe and discrete lesions in the RIGHT hepatic lobe consistent with metastatic disease versus primary hepatic biliary carcinoma. Favor cholangiocarcinoma. 4. Percutaneous drainage catheter extends the porta hepatis. Small trapped pneumothorax along the tract of the catheter at the inferolateral RIGHT lung base. 5. Stable adrenal metastasis. 6. Metastatic adenopathy in the retroperitoneum unchanged. 7. Morphologic changes in liver consistent with  cirrhosis.    11/08/2017 Imaging    11/08/2017 CT AP IMPRESSION: 1. Overall no significant interval change since the CT of 11/06/2017. Stable positioning of the percutaneous biliary drain with similar degree of biliary ductal dilatation. 2. Cirrhosis with multiple hepatic metastatic disease. Infiltrative appearing lesion in the left lobe of the liver appears to extend into the left portal vein and may represent a tumor thrombus, or bland thrombus. A  primary biliary neoplasm (cholangiocarcinoma, or metastatic disease are other considerations. 3. Retroperitoneal, mesenteric, and adrenal metastatic disease. 4. Hepatic colopathy versus less likely segmental colitis of the hepatic flexure. 5. Small right pleural effusion and bilateral lung base pulmonary masses/nodules as seen previously. 6. Tiny pockets of pneumoperitoneum lateral to the liver along the percutaneous drain, decreased since the prior CT. 7. Other findings as above.    11/20/2017 - 11/24/2017 Hospital Admission    Admit date: 11/08/2017 Admission diagnosis: fatigue Additional comments: discharged 11/24/2017    12/18/2017 -  Chemotherapy    first line chemo gemcitabine/cisplatin on days 1 and 15 every 28 days starting 12/18/17    12/22/2017 - 12/30/2017 Hospital Admission    Admit date: 12/22/2017 Admission diagnosis: Dysuria    12/22/2017 Imaging    12/22/2017 CT AP IMPRESSION: 1. Prominently distended and otherwise normal urinary bladder with mild prostatomegaly. Findings suggest acute bladder outlet obstruction. No hydronephrosis. 2. Large colorectal stool volume compatible with constipation. New mild circumferential rectal wall thickening and perirectal fat haziness suggesting stercoral colitis. No free air. No abscess. 3. Stable infiltrative cholangiocarcinoma in the central left liver lobe with associated peripheral intrahepatic biliary ductal dilatation throughout the left liver lobe. 4. Stable well-positioned internal external drain traversing the right liver lobe and decompressing the right liver biliary system. 5. Stable liver, bilateral adrenal, retroperitoneal, left mesenteric and right infrahilar nodal and left lung base metastases. No progressive metastatic disease since 12/03/2017 CT. 6. Cirrhosis. Trace perihepatic ascites, decreased. Stable moderate splenomegaly. 7.  Aortic Atherosclerosis (ICD10-I70.0).     CURRENT THERAPY first line chemo  gemcitabine/cisplatin on days 1 and 8 every 21 days started 12/18/17   INTERVAL HISTORY: Matthew Lewis is a 63 y.o. male who is here for follow-up. Today, he is here with  his family member. He lost his appetite after last cycle, but he regained it and has gained weight. He reported falling and hurting his head while trying to get up to urinate at night after last cycle. He is doing better now. No nausea or bloating. He empties his right drainage bag up to 8 times a day with an output of up to 2L a day. He is fatigued and not very active at home. He had a fever with not chills last night for which he took tylenol. His fever resolved now.   Pertinent positives and negatives of review of systems are listed and detailed within the above HPI.  REVIEW OF SYSTEMS:   Constitutional: Denies fevers, chills or abnormal weight loss (+) fatigue (+) recent fever Eyes: Denies blurriness of vision Ears, nose, mouth, throat, and face: Denies mucositis or sore throat Respiratory: Denies cough, dyspnea or wheezes Cardiovascular: Denies palpitation, chest discomfort or lower extremity swelling Gastrointestinal:  Denies nausea, heartburn or change in bowel habits Skin: Denies abnormal skin rashes Lymphatics: Denies new lymphadenopathy or easy bruising Neurological:Denies numbness, tingling or new weaknesses Behavioral/Psych: Mood is stable, no new changes  All other systems were reviewed with the patient and are negative.  MEDICAL HISTORY:  Past Medical History:  Diagnosis Date  . Abscess of anal and rectal regions    horse shoe abscess  . Alcoholic cirrhosis ([place])   . Arthritis    "everywhere; especially in my spine"  . Cirrhosis ([place])    STAGE 1 CIRRHOSIS PATIENT SEES DR NAMDIGAM FOR  . Colon cancer ([place]) 05/2005   TX SURGERY WITH LYMPH NODE REMOVAL  . Cough    LAST FEW WEEKS SAW DR RAMOS LUNGS MILKY SPUTUM OCC  . DM type 2 with diabetic peripheral neuropathy ([facility])    left foot  . Fatigue   .  Fistula, anal    multiple  . History of substance abuse ([place])    last alcohol was 08/2011; last marijuania was 08/2011  . Hypertension   . PTSD (post-traumatic stress disorder)    "severe" HX OF  . Sleep deprivation     SURGICAL HISTORY: Past Surgical History:  Procedure Laterality Date  . ABSCESS DRAINAGE     "probably 15-20 so far at least; rectal"  . ANAL FISTULECTOMY  12/13/1998  . ANTERIOR FUSION CERVICAL SPINE  2010   "triple"; "did 2 surgeries on the same day in [id number] due to complications"  . CORONARY ANGIOPLASTY  2001  . detached muscle  2010   right chest; "after cervical fusion complications"  . ELBOW SURGERY  2007   "cut out part of a muscle"; left  . EXAMINATION UNDER ANESTHESIA  03/22/2005   fistula  . EXAMINATION UNDER ANESTHESIA  06/06/2011   Procedure: EXAM UNDER ANESTHESIA;  Surgeon: Stark Klein, MD;  Location: [place];  Service: General;  Laterality: N/A;  . HEMICOLECTOMY  06/06/2005   left  . INCISION AND DRAINAGE PERIRECTAL ABSCESS  03/30/2005  . INCISION AND DRAINAGE PERIRECTAL ABSCESS  12/13/2004  . INCISION AND DRAINAGE PERIRECTAL ABSCESS  07/14/2001  . INCISION AND DRAINAGE PERIRECTAL ABSCESS  08/12/2010   horseshoe abscess; Dr Redmond Pulling  . INCISION AND DRAINAGE PERIRECTAL ABSCESS  06/06/2011   Procedure: IRRIGATION AND DEBRIDEMENT PERIRECTAL ABSCESS;  Surgeon: Stark Klein, MD;  Location: [facility];  Service: General;  Laterality: N/A;  . INCISION AND DRAINAGE PERIRECTAL ABSCESS N/A 02/24/2017   Procedure: IRRIGATION AND DEBRIDEMENT PERIRECTAL ABSCESS;  Surgeon: Ralene Ok, MD;  Location: [facility];  Service: General;  Laterality: N/A;  . IR CHOLANGIOGRAM EXISTING TUBE  12/23/2017  . IR CHOLANGIOGRAM EXISTING TUBE  01/02/2018  . IR EXCHANGE BILIARY DRAIN  11/07/2017  . IR EXCHANGE BILIARY DRAIN  12/09/2017  . IR IMAGING GUIDED PORT INSERTION  12/09/2017  . IR INT EXT BILIARY DRAIN WITH CHOLANGIOGRAM  10/26/2017  . IR INT EXT BILIARY DRAIN WITH CHOLANGIOGRAM  12/23/2017    . IR PATIENT EVAL TECH 0-60 MINS  12/02/2017  . IR RADIOLOGIST EVAL & MGMT  11/06/2017  . IR RADIOLOGIST EVAL & MGMT  01/05/2018  . [place]  . PERCUTANEOUS PINNING PHALANX FRACTURE OF HAND  ~ 2008   "plates in 2 places"; right  . PLACEMENT OF SETON N/A 09/05/2017   Procedure: PLACEMENT OF SETON;  Surgeon: Leighton Ruff, MD;  Location: [facility];  Service: General;  Laterality: N/A;  . RADIOLOGY WITH ANESTHESIA N/A 10/25/2017   Procedure: MRI WITH ANESTHESIA;  Surgeon: Radiologist, Medication, MD;  Location: [place];  Service: Radiology;  Laterality: N/A;  . THORACIC DISCECTOMY  1990's    I have reviewed the social history and family history with the patient and they are unchanged from previous note.  ALLERGIES:  is allergic to morphine and related.  MEDICATIONS:  Current Outpatient Medications  Medication Sig Dispense Refill  . aspirin EC 81 MG tablet Take 81 mg by mouth daily.    . Ca Carbonate-Mag Hydroxide (ROLAIDS PO) Take 1-2 tablets by mouth 2 (two) times daily as needed (heartburn).    . celecoxib (CELEBREX) 200 MG capsule Take 200 mg by mouth at bedtime.   5  . fexofenadine (ALLEGRA) 180 MG tablet Take 180 mg by mouth at bedtime.    . gabapentin (NEURONTIN) 400 MG capsule Take 400-800 mg by mouth See admin instructions. Take 400 mg by mouth in the morning and 800 mg in the evening    . lactose free nutrition (BOOST PLUS) LIQD Take 237 mLs by mouth 3 (three) times daily with meals. 30 Can 0  . lidocaine-prilocaine (EMLA) cream Apply to port site 1 hour prior to access 30 g 0  . Multiple Vitamin (MULTIVITAMIN) tablet Take 1 tablet by mouth daily.    . ondansetron (ZOFRAN) 8 MG tablet Take 1 tablet (8 mg total) by mouth 2 (two) times daily as needed. Start on the third day after chemotherapy. 30 tablet 1  . tamsulosin (FLOMAX) 0.4 MG CAPS capsule Take 1 capsule (0.4 mg total) by mouth daily after supper. 30 capsule 0   No current  facility-administered medications for this visit.     PHYSICAL EXAMINATION: ECOG PERFORMANCE STATUS: 2 - Symptomatic, <50% confined to bed  Vitals:   01/22/18 1316  BP: 119/74  Pulse: (!) 107  Resp: 18  Temp: 97.8 F (36.6 C)  SpO2: 100%   Filed Weights   01/22/18 1316  Weight: 195 lb 4.8 oz (88.6 kg)    GENERAL:alert, no distress and comfortable SKIN: skin color, texture, turgor are normal, no rashes or significant lesions EYES: normal, Conjunctiva are pink and non-injected, sclera clear OROPHARYNX:no exudate, no erythema and lips, buccal mucosa, and tongue normal  NECK: supple, thyroid normal size, non-tender, without nodularity LYMPH:  no palpable lymphadenopathy in the cervical, axillary or inguinal LUNGS: clear to auscultation and percussion with normal breathing effort HEART: regular rate & rhythm and no murmurs and no lower extremity edema ABDOMEN:abdomen soft, non-tender and normal bowel sounds (+) right drainage tube placed (+) mild skin  breakdown around tube insertion site  Musculoskeletal:no cyanosis of digits and no clubbing  NEURO: alert & oriented x 3 with fluent speech, no focal motor/sensory deficits  LABORATORY DATA:  I have reviewed the data as listed CBC Latest Ref Rng & Units 01/22/2018 01/15/2018 01/13/2018  WBC 4.0 - 10.5 K/uL 3.9(L) 9.1 8.5  Hemoglobin 13.0 - 17.0 g/dL 8.5(L) 11.0(L) 10.6(L)  Hematocrit 39.0 - 52.0 % 25.8(L) 34.2(L) 32.9(L)  Platelets 150 - 400 K/uL 88(L) 291 310     CMP Latest Ref Rng & Units 01/22/2018 01/15/2018 01/13/2018  Glucose 70 - 99 mg/dL 177(H) 138(H) 134(H)  BUN 8 - 23 mg/dL 19 24(H) 17  Creatinine 0.61 - 1.24 mg/dL 0.69 0.77 0.76  Sodium 135 - 145 mmol/L 128(L) 132(L) 132(L)  Potassium 3.5 - 5.1 mmol/L 4.6 4.5 4.6  Chloride 98 - 111 mmol/L 100 102 101  CO2 22 - 32 mmol/L _0 Calcium 8.9 - 10.3 mg/dL 9.2 9.2 10.0  Total Protein 6.5 - 8.1 g/dL 7.6 8.8(H) 8.7(H)  Total Bilirubin 0.3 - 1.2 mg/dL 1.8(H) 1.9(H) 1.8(H)    Alkaline Phos 38 - 126 U/L 348(H) 271(H) 317(H)  AST 15 - 41 U/L 38 119(H) 73(H)  ALT 0 - 44 U/L 54(H) 128(H) 74(H)      RADIOGRAPHIC STUDIES: I have personally reviewed the radiological images as listed and agreed with the findings in the report. No results found.   ASSESSMENT & PLAN:  Matthew Lewis is a 63 y.o. male with history of  1. Metastatic intrahepatic cholangiocarcinoma to right adrenal gland, liver, lungs and nodes -Diagnosed in 10/2017. He had 2 biliary drainage placements.  His cancer treatment was postponed due to multiple hospitalization for infection, dehydration, it etc.  He is currently on first line Cisplatin and gemcitabine. Tolerating moderate well. -Labs reviewed, CBC showed Hg 8.5 WBC 3.9K PLT 88K. CMP showed Na 128 BG 177 Albumin 2.7 AST 38 ALT 54 AKP 348 total bilirubin 1.8. -Will decrease gemcitabine dose tomorrow dur to thrombocytopenia -His hemoglobin has been dropping also, may need blood transfusion, will set up a lab and blood transfusion for next week. -If he has worsening cytopenias, may consider changing his chemo to every 2 weeks. -f/u in 2 weeks with labs and cycle 3 chemotherapy. -Plan to repeat staging scan after cycle 3 chemo  2.  Obstructive jaundice, status post bilateral percutaneous biliary drain -Labs reviewed, CMP showed Na 128 BG 177 Albumin 2.7 AST 38 ALT 54 AKP 348 total bilirubin 1.8. -I will send a message to IR Dr. Pascal Lux, to see if he can His right biliary drainage tube, due to the high output  3.Liver cirrhosis secondary to alcohol and NASH -Stable  4. Portal vein thrombosis -He is on aspirin, no anticoagulation due to bloody biliary drainage  5. DM, HTN  -f/u with PCP and continue medications -We will monitor his diabetes and blood pressure closely during the chemo  6.H/o substance abuse and addiction -He's been clean for 6 years now -will becautious about narcoticsandbenzo.  7.Goal of care discussion,  DNR/DNI -Wepreviouslydiscussed code status in length.I recommend DNR/DNI,he agrees. -He understands his cancer is not curable, and the goal of therapy is to prolong his life and improve his quality of life.  Plan  -Labs reviewed, can continue with chemo C2D8 tomorrow. Will decrease dose of gemcitabine to 648m/m2 due to thrombocytopenia -Will schedule Lab and possible blood transfusion next week -f/u in 2 weeks with labs and cycle 3 chemo  No  problem-specific Assessment & Plan notes found for this encounter.   No orders of the defined types were placed in this encounter.  All questions were answered. The patient knows to call the clinic with any problems, questions or concerns. No barriers to learning was detected. I spent 20 minutes counseling the patient face to face. The total time spent in the appointment was 25 minutes and more than 50% was on counseling and review of test results  I, Noor Dweik am acting as scribe for Dr. [name].  I have reviewed the above documentation for accuracy and completeness, and I agree with the above.     [name], MD 01/22/2018

## 2018-01-20 NOTE — Telephone Encounter (Signed)
Spoke with patient concerning his upcoming appointment. Also transferred patient to [facility] to speak with her concerning RX's. He is aware of infusion appointment approved for 1/17 @ 7:30

## 2018-01-22 ENCOUNTER — Inpatient Hospital Stay (HOSPITAL_BASED_OUTPATIENT_CLINIC_OR_DEPARTMENT_OTHER): Payer: Medicare Other | Admitting: Hematology

## 2018-01-22 ENCOUNTER — Encounter: Payer: Self-pay | Admitting: Hematology

## 2018-01-22 ENCOUNTER — Inpatient Hospital Stay: Payer: Medicare Other

## 2018-01-22 VITALS — BP 119/74 | HR 107 | Temp 97.8°F | Resp 18 | Ht 71.0 in | Wt 195.3 lb

## 2018-01-22 DIAGNOSIS — K7030 Alcoholic cirrhosis of liver without ascites: Secondary | ICD-10-CM

## 2018-01-22 DIAGNOSIS — C221 Intrahepatic bile duct carcinoma: Secondary | ICD-10-CM

## 2018-01-22 DIAGNOSIS — Z794 Long term (current) use of insulin: Secondary | ICD-10-CM

## 2018-01-22 DIAGNOSIS — C7971 Secondary malignant neoplasm of right adrenal gland: Secondary | ICD-10-CM | POA: Diagnosis not present

## 2018-01-22 DIAGNOSIS — K831 Obstruction of bile duct: Secondary | ICD-10-CM

## 2018-01-22 DIAGNOSIS — E46 Unspecified protein-calorie malnutrition: Secondary | ICD-10-CM | POA: Diagnosis not present

## 2018-01-22 DIAGNOSIS — I10 Essential (primary) hypertension: Secondary | ICD-10-CM | POA: Diagnosis not present

## 2018-01-22 DIAGNOSIS — E1142 Type 2 diabetes mellitus with diabetic polyneuropathy: Secondary | ICD-10-CM | POA: Diagnosis not present

## 2018-01-22 DIAGNOSIS — I81 Portal vein thrombosis: Secondary | ICD-10-CM | POA: Diagnosis not present

## 2018-01-22 DIAGNOSIS — C801 Malignant (primary) neoplasm, unspecified: Secondary | ICD-10-CM

## 2018-01-22 DIAGNOSIS — C7801 Secondary malignant neoplasm of right lung: Secondary | ICD-10-CM | POA: Diagnosis not present

## 2018-01-22 DIAGNOSIS — Z79899 Other long term (current) drug therapy: Secondary | ICD-10-CM | POA: Diagnosis not present

## 2018-01-22 DIAGNOSIS — Z5111 Encounter for antineoplastic chemotherapy: Secondary | ICD-10-CM | POA: Diagnosis not present

## 2018-01-22 DIAGNOSIS — E119 Type 2 diabetes mellitus without complications: Secondary | ICD-10-CM

## 2018-01-22 DIAGNOSIS — C7802 Secondary malignant neoplasm of left lung: Secondary | ICD-10-CM | POA: Diagnosis not present

## 2018-01-22 DIAGNOSIS — Z95828 Presence of other vascular implants and grafts: Secondary | ICD-10-CM

## 2018-01-22 DIAGNOSIS — C7800 Secondary malignant neoplasm of unspecified lung: Secondary | ICD-10-CM

## 2018-01-22 DIAGNOSIS — I700 Atherosclerosis of aorta: Secondary | ICD-10-CM | POA: Diagnosis not present

## 2018-01-22 LAB — CBC WITH DIFFERENTIAL (CANCER CENTER ONLY)
Abs Immature Granulocytes: 0.02 10*3/uL (ref 0.00–0.07)
Basophils Absolute: 0 10*3/uL (ref 0.0–0.1)
Basophils Relative: 0 %
Eosinophils Absolute: 0.2 10*3/uL (ref 0.0–0.5)
Eosinophils Relative: 6 %
HEMATOCRIT: 25.8 % — AB (ref 39.0–52.0)
Hemoglobin: 8.5 g/dL — ABNORMAL LOW (ref 13.0–17.0)
Immature Granulocytes: 1 %
Lymphocytes Relative: 18 %
Lymphs Abs: 0.7 10*3/uL (ref 0.7–4.0)
MCH: 29.4 pg (ref 26.0–34.0)
MCHC: 32.9 g/dL (ref 30.0–36.0)
MCV: 89.3 fL (ref 80.0–100.0)
Monocytes Absolute: 0.4 10*3/uL (ref 0.1–1.0)
Monocytes Relative: 9 %
Neutro Abs: 2.6 10*3/uL (ref 1.7–7.7)
Neutrophils Relative %: 66 %
Platelet Count: 88 10*3/uL — ABNORMAL LOW (ref 150–400)
RBC: 2.89 MIL/uL — ABNORMAL LOW (ref 4.22–5.81)
RDW: 16.3 % — ABNORMAL HIGH (ref 11.5–15.5)
WBC Count: 3.9 10*3/uL — ABNORMAL LOW (ref 4.0–10.5)
nRBC: 0 % (ref 0.0–0.2)

## 2018-01-22 LAB — CMP (CANCER CENTER ONLY)
ALT: 54 U/L — AB (ref 0–44)
AST: 38 U/L (ref 15–41)
Albumin: 2.7 g/dL — ABNORMAL LOW (ref 3.5–5.0)
Alkaline Phosphatase: 348 U/L — ABNORMAL HIGH (ref 38–126)
Anion gap: 6 (ref 5–15)
BUN: 19 mg/dL (ref 8–23)
CALCIUM: 9.2 mg/dL (ref 8.9–10.3)
CO2: 22 mmol/L (ref 22–32)
CREATININE: 0.69 mg/dL (ref 0.61–1.24)
Chloride: 100 mmol/L (ref 98–111)
GFR, Estimated: >60 mL/min (ref >60)
Glucose, Bld: 177 mg/dL — ABNORMAL HIGH (ref 70–99)
Potassium: 4.6 mmol/L (ref 3.5–5.1)
Sodium: 128 mmol/L — ABNORMAL LOW (ref 135–145)
Total Bilirubin: 1.8 mg/dL — ABNORMAL HIGH (ref 0.3–1.2)
Total Protein: 7.6 g/dL (ref 6.5–8.1)

## 2018-01-22 MED ORDER — HEPARIN SOD (PORK) LOCK FLUSH 100 UNIT/ML IV SOLN
500.0000 [IU] | Freq: Once | INTRAVENOUS | Status: AC
  Administered 2018-01-22: 500 [IU] via INTRAVENOUS
  Filled 2018-01-22: qty 5

## 2018-01-22 MED ORDER — SODIUM CHLORIDE 0.9% FLUSH
10.0000 mL | INTRAVENOUS | Status: DC | PRN
  Administered 2018-01-22: 10 mL via INTRAVENOUS
  Filled 2018-01-22: qty 10

## 2018-01-23 ENCOUNTER — Inpatient Hospital Stay: Payer: Medicare Other

## 2018-01-23 ENCOUNTER — Telehealth: Payer: Self-pay | Admitting: Hematology

## 2018-01-23 ENCOUNTER — Other Ambulatory Visit: Payer: Self-pay

## 2018-01-23 ENCOUNTER — Inpatient Hospital Stay: Payer: Medicare Other | Admitting: Nutrition

## 2018-01-23 VITALS — BP 128/79 | HR 96 | Temp 97.9°F | Resp 18

## 2018-01-23 DIAGNOSIS — I10 Essential (primary) hypertension: Secondary | ICD-10-CM | POA: Diagnosis not present

## 2018-01-23 DIAGNOSIS — E46 Unspecified protein-calorie malnutrition: Secondary | ICD-10-CM | POA: Diagnosis not present

## 2018-01-23 DIAGNOSIS — E1142 Type 2 diabetes mellitus with diabetic polyneuropathy: Secondary | ICD-10-CM | POA: Diagnosis not present

## 2018-01-23 DIAGNOSIS — C221 Intrahepatic bile duct carcinoma: Secondary | ICD-10-CM | POA: Diagnosis not present

## 2018-01-23 DIAGNOSIS — Z7189 Other specified counseling: Secondary | ICD-10-CM

## 2018-01-23 DIAGNOSIS — C7971 Secondary malignant neoplasm of right adrenal gland: Secondary | ICD-10-CM | POA: Diagnosis not present

## 2018-01-23 DIAGNOSIS — I81 Portal vein thrombosis: Secondary | ICD-10-CM | POA: Diagnosis not present

## 2018-01-23 DIAGNOSIS — Z79899 Other long term (current) drug therapy: Secondary | ICD-10-CM | POA: Diagnosis not present

## 2018-01-23 DIAGNOSIS — C7802 Secondary malignant neoplasm of left lung: Secondary | ICD-10-CM | POA: Diagnosis not present

## 2018-01-23 DIAGNOSIS — I700 Atherosclerosis of aorta: Secondary | ICD-10-CM | POA: Diagnosis not present

## 2018-01-23 DIAGNOSIS — C7801 Secondary malignant neoplasm of right lung: Secondary | ICD-10-CM | POA: Diagnosis not present

## 2018-01-23 DIAGNOSIS — Z5111 Encounter for antineoplastic chemotherapy: Secondary | ICD-10-CM | POA: Diagnosis not present

## 2018-01-23 LAB — CANCER ANTIGEN 19-9: CA 19-9: 48 U/mL — ABNORMAL HIGH (ref 0–35)

## 2018-01-23 MED ORDER — PALONOSETRON HCL INJECTION 0.25 MG/5ML
INTRAVENOUS | Status: AC
  Filled 2018-01-23: qty 5

## 2018-01-23 MED ORDER — HEPARIN SOD (PORK) LOCK FLUSH 100 UNIT/ML IV SOLN
500.0000 [IU] | Freq: Once | INTRAVENOUS | Status: AC | PRN
  Administered 2018-01-23: 500 [IU]
  Filled 2018-01-23: qty 5

## 2018-01-23 MED ORDER — POTASSIUM CHLORIDE 2 MEQ/ML IV SOLN
Freq: Once | INTRAVENOUS | Status: AC
  Administered 2018-01-23: 09:00:00 via INTRAVENOUS
  Filled 2018-01-23: qty 10

## 2018-01-23 MED ORDER — SODIUM CHLORIDE 0.9 % IV SOLN
600.0000 mg/m2 | Freq: Once | INTRAVENOUS | Status: AC
  Administered 2018-01-23: 1254 mg via INTRAVENOUS
  Filled 2018-01-23: qty 32.98

## 2018-01-23 MED ORDER — SODIUM CHLORIDE 0.9 % IV SOLN
Freq: Once | INTRAVENOUS | Status: AC
  Administered 2018-01-23: 09:00:00 via INTRAVENOUS
  Filled 2018-01-23: qty 250

## 2018-01-23 MED ORDER — PALONOSETRON HCL INJECTION 0.25 MG/5ML
0.2500 mg | Freq: Once | INTRAVENOUS | Status: AC
  Administered 2018-01-23: 0.25 mg via INTRAVENOUS

## 2018-01-23 MED ORDER — SODIUM CHLORIDE 0.9% FLUSH
10.0000 mL | INTRAVENOUS | Status: DC | PRN
  Administered 2018-01-23: 10 mL
  Filled 2018-01-23: qty 10

## 2018-01-23 MED ORDER — SODIUM CHLORIDE 0.9 % IV SOLN
Freq: Once | INTRAVENOUS | Status: AC
  Administered 2018-01-23: 11:00:00 via INTRAVENOUS
  Filled 2018-01-23: qty 5

## 2018-01-23 MED ORDER — SODIUM CHLORIDE 0.9 % IV SOLN
25.0000 mg/m2 | Freq: Once | INTRAVENOUS | Status: AC
  Administered 2018-01-23: 52 mg via INTRAVENOUS
  Filled 2018-01-23: qty 52

## 2018-01-23 NOTE — Telephone Encounter (Signed)
Spoke with patient over the phone, he stated he was in treatment.  I took the patient his calendar and he informed me that he wanted me to let the doctor know that he weighed 189 point something today and yesterday her weighed 196 roughly and that he know he has not lost that must weight overnight in day and was concerned.  I informed the MD nurse and she said she will let Burr Medico know.

## 2018-01-23 NOTE — Progress Notes (Signed)
Nutrition follow-up completed with patient receiving chemotherapy for metastatic right adrenal gland cancer. Weight decreased and was documented as 195.3 pounds January 16. This is decreased from 208.3 pounds December 11 and 198.2 pounds November 25. Patient has been consuming Ensure max providing 150 cal and 30 g of protein.  He has requested coupons. Noted labs: Sodium 128, glucose 177, and albumin 2.7. Patient reports he is eating well.  Nutrition diagnosis: Unintended weight loss continues.  Intervention: Patient will continue adequate calories and protein as well as oral nutrition supplements to minimize weight loss. Provided additional information about oral nutrition supplements and provided coupons.  Monitoring, evaluation, goals: Patient will tolerate adequate calories and protein to minimize weight loss throughout treatment.  Next visit: Friday, January 31 during infusion.  **Disclaimer: This note was dictated with voice recognition software. Similar sounding words can inadvertently be transcribed and this note may contain transcription errors which may not have been corrected upon publication of note.**

## 2018-01-23 NOTE — Patient Instructions (Signed)
[facility] Discharge Instructions for Patients Receiving Chemotherapy  Today you received the following chemotherapy agents: Gemcitabine (Gemzar) and Cisplatin (Platinol)  To help prevent nausea and vomiting after your treatment, we encourage you to take your nausea medication as directed.  If you develop nausea and vomiting that is not controlled by your nausea medication, call the clinic.   BELOW ARE SYMPTOMS THAT SHOULD BE REPORTED IMMEDIATELY:  *FEVER GREATER THAN 100.5 F  *CHILLS WITH OR WITHOUT FEVER  NAUSEA AND VOMITING THAT IS NOT CONTROLLED WITH YOUR NAUSEA MEDICATION  *UNUSUAL SHORTNESS OF BREATH  *UNUSUAL BRUISING OR BLEEDING  TENDERNESS IN MOUTH AND THROAT WITH OR WITHOUT PRESENCE OF ULCERS  *URINARY PROBLEMS  *BOWEL PROBLEMS  UNUSUAL RASH Items with * indicate a potential emergency and should be followed up as soon as possible.  Feel free to call the clinic should you have any questions or concerns. The clinic phone number is [phone].  Please show the CHEMO ALERT CARD at check-in to the Emergency Department and triage nurse.   

## 2018-01-23 NOTE — Telephone Encounter (Signed)
Added treatment to the book, when treatment has been added, will add the lab and flush and then call the patients about their appts.

## 2018-01-23 NOTE — Progress Notes (Signed)
Per [name], patient is OK to treat with current lab results.   Per [name], patient is OK to treat with output of 100.

## 2018-01-23 NOTE — Progress Notes (Signed)
Ordered Tramadol once dose here

## 2018-01-24 ENCOUNTER — Inpatient Hospital Stay: Payer: Medicare Other

## 2018-01-24 DIAGNOSIS — E1142 Type 2 diabetes mellitus with diabetic polyneuropathy: Secondary | ICD-10-CM | POA: Diagnosis not present

## 2018-01-24 DIAGNOSIS — C7801 Secondary malignant neoplasm of right lung: Secondary | ICD-10-CM | POA: Diagnosis not present

## 2018-01-24 DIAGNOSIS — C7971 Secondary malignant neoplasm of right adrenal gland: Secondary | ICD-10-CM | POA: Diagnosis not present

## 2018-01-24 DIAGNOSIS — Z5111 Encounter for antineoplastic chemotherapy: Secondary | ICD-10-CM | POA: Diagnosis not present

## 2018-01-24 DIAGNOSIS — C221 Intrahepatic bile duct carcinoma: Secondary | ICD-10-CM | POA: Diagnosis not present

## 2018-01-24 DIAGNOSIS — Z7189 Other specified counseling: Secondary | ICD-10-CM

## 2018-01-24 DIAGNOSIS — C7802 Secondary malignant neoplasm of left lung: Secondary | ICD-10-CM | POA: Diagnosis not present

## 2018-01-24 DIAGNOSIS — E46 Unspecified protein-calorie malnutrition: Secondary | ICD-10-CM | POA: Diagnosis not present

## 2018-01-24 DIAGNOSIS — I700 Atherosclerosis of aorta: Secondary | ICD-10-CM | POA: Diagnosis not present

## 2018-01-24 DIAGNOSIS — I81 Portal vein thrombosis: Secondary | ICD-10-CM | POA: Diagnosis not present

## 2018-01-24 DIAGNOSIS — I10 Essential (primary) hypertension: Secondary | ICD-10-CM | POA: Diagnosis not present

## 2018-01-24 DIAGNOSIS — Z79899 Other long term (current) drug therapy: Secondary | ICD-10-CM | POA: Diagnosis not present

## 2018-01-24 MED ORDER — PEGFILGRASTIM-CBQV 6 MG/0.6ML ~~LOC~~ SOSY
6.0000 mg | PREFILLED_SYRINGE | Freq: Once | SUBCUTANEOUS | Status: AC
  Administered 2018-01-24: 6 mg via SUBCUTANEOUS

## 2018-01-24 MED ORDER — PEGFILGRASTIM-CBQV 6 MG/0.6ML ~~LOC~~ SOSY
PREFILLED_SYRINGE | SUBCUTANEOUS | Status: AC
  Filled 2018-01-24: qty 0.6

## 2018-01-24 NOTE — Patient Instructions (Signed)
Pegfilgrastim injection  What is this medicine?  PEGFILGRASTIM (PEG fil gra stim) is a long-acting granulocyte colony-stimulating factor that stimulates the growth of neutrophils, a type of white blood cell important in the body's fight against infection. It is used to reduce the incidence of fever and infection in patients with certain types of cancer who are receiving chemotherapy that affects the bone marrow, and to increase survival after being exposed to high doses of radiation.  This medicine may be used for other purposes; ask your health care provider or pharmacist if you have questions.  COMMON BRAND NAME(S): Fulphila, Neulasta, UDENYCA  What should I tell my health care provider before I take this medicine?  They need to know if you have any of these conditions:  -kidney disease  -latex allergy  -ongoing radiation therapy  -sickle cell disease  -skin reactions to acrylic adhesives (On-Body Injector only)  -an unusual or allergic reaction to pegfilgrastim, filgrastim, other medicines, foods, dyes, or preservatives  -pregnant or trying to get pregnant  -breast-feeding  How should I use this medicine?  This medicine is for injection under the skin. If you get this medicine at home, you will be taught how to prepare and give the pre-filled syringe or how to use the On-body Injector. Refer to the patient Instructions for Use for detailed instructions. Use exactly as directed. Tell your healthcare provider immediately if you suspect that the On-body Injector may not have performed as intended or if you suspect the use of the On-body Injector resulted in a missed or partial dose.  It is important that you put your used needles and syringes in a special sharps container. Do not put them in a trash can. If you do not have a sharps container, call your pharmacist or healthcare provider to get one.  Talk to your pediatrician regarding the use of this medicine in children. While this drug may be prescribed for  selected conditions, precautions do apply.  Overdosage: If you think you have taken too much of this medicine contact a poison control center or emergency room at once.  NOTE: This medicine is only for you. Do not share this medicine with others.  What if I miss a dose?  It is important not to miss your dose. Call your doctor or health care professional if you miss your dose. If you miss a dose due to an On-body Injector failure or leakage, a new dose should be administered as soon as possible using a single prefilled syringe for manual use.  What may interact with this medicine?  Interactions have not been studied.  Give your health care provider a list of all the medicines, herbs, non-prescription drugs, or dietary supplements you use. Also tell them if you smoke, drink alcohol, or use illegal drugs. Some items may interact with your medicine.  This list may not describe all possible interactions. Give your health care provider a list of all the medicines, herbs, non-prescription drugs, or dietary supplements you use. Also tell them if you smoke, drink alcohol, or use illegal drugs. Some items may interact with your medicine.  What should I watch for while using this medicine?  You may need blood work done while you are taking this medicine.  If you are going to need a MRI, CT scan, or other procedure, tell your doctor that you are using this medicine (On-Body Injector only).  What side effects may I notice from receiving this medicine?  Side effects that you should report to   your doctor or health care professional as soon as possible:  -allergic reactions like skin rash, itching or hives, swelling of the face, lips, or tongue  -back pain  -dizziness  -fever  -pain, redness, or irritation at site where injected  -pinpoint red spots on the skin  -red or dark-brown urine  -shortness of breath or breathing problems  -stomach or side pain, or pain at the shoulder  -swelling  -tiredness  -trouble passing urine or  change in the amount of urine  Side effects that usually do not require medical attention (report to your doctor or health care professional if they continue or are bothersome):  -bone pain  -muscle pain  This list may not describe all possible side effects. Call your doctor for medical advice about side effects. You may report side effects to FDA at 1-800-FDA-1088.  Where should I keep my medicine?  Keep out of the reach of children.  If you are using this medicine at home, you will be instructed on how to store it. Throw away any unused medicine after the expiration date on the label.  NOTE: This sheet is a summary. It may not cover all possible information. If you have questions about this medicine, talk to your doctor, pharmacist, or health care provider.   2019 Elsevier/Gold Standard (2017-03-31 16:57:08)

## 2018-01-26 ENCOUNTER — Telehealth: Payer: Self-pay

## 2018-01-26 NOTE — Telephone Encounter (Signed)
Ulice Dash, his has pretty high output from his right side biliary drain, could you or your PA see him sooner than next Tuesday to see if it can be capped?? His left is capped. Thanks   [name] MD

## 2018-01-26 NOTE — Telephone Encounter (Signed)
Patient calls stating that yesterday he had to attached the second bag to his biliary drain due to saturating the pads, spoke with Juliann Pulse in IR has appointment next Tuesday to see about capping them both.  Patient states he feels fine just wanting to let [name] know.

## 2018-01-28 ENCOUNTER — Inpatient Hospital Stay: Payer: Medicare Other

## 2018-01-28 VITALS — BP 120/75 | HR 103 | Temp 97.7°F | Resp 18 | Ht 71.0 in

## 2018-01-28 DIAGNOSIS — I10 Essential (primary) hypertension: Secondary | ICD-10-CM | POA: Diagnosis not present

## 2018-01-28 DIAGNOSIS — I81 Portal vein thrombosis: Secondary | ICD-10-CM | POA: Diagnosis not present

## 2018-01-28 DIAGNOSIS — Z5111 Encounter for antineoplastic chemotherapy: Secondary | ICD-10-CM | POA: Diagnosis not present

## 2018-01-28 DIAGNOSIS — E1142 Type 2 diabetes mellitus with diabetic polyneuropathy: Secondary | ICD-10-CM | POA: Diagnosis not present

## 2018-01-28 DIAGNOSIS — Z95828 Presence of other vascular implants and grafts: Secondary | ICD-10-CM | POA: Insufficient documentation

## 2018-01-28 DIAGNOSIS — C7801 Secondary malignant neoplasm of right lung: Secondary | ICD-10-CM | POA: Diagnosis not present

## 2018-01-28 DIAGNOSIS — C7971 Secondary malignant neoplasm of right adrenal gland: Secondary | ICD-10-CM | POA: Diagnosis not present

## 2018-01-28 DIAGNOSIS — C7802 Secondary malignant neoplasm of left lung: Secondary | ICD-10-CM | POA: Diagnosis not present

## 2018-01-28 DIAGNOSIS — Z79899 Other long term (current) drug therapy: Secondary | ICD-10-CM | POA: Diagnosis not present

## 2018-01-28 DIAGNOSIS — E46 Unspecified protein-calorie malnutrition: Secondary | ICD-10-CM | POA: Diagnosis not present

## 2018-01-28 DIAGNOSIS — C221 Intrahepatic bile duct carcinoma: Secondary | ICD-10-CM | POA: Diagnosis not present

## 2018-01-28 DIAGNOSIS — I700 Atherosclerosis of aorta: Secondary | ICD-10-CM | POA: Diagnosis not present

## 2018-01-28 LAB — CMP (CANCER CENTER ONLY)
ALT: 66 U/L — ABNORMAL HIGH (ref 0–44)
ANION GAP: 11 (ref 5–15)
AST: 38 U/L (ref 15–41)
Albumin: 2.9 g/dL — ABNORMAL LOW (ref 3.5–5.0)
Alkaline Phosphatase: 411 U/L — ABNORMAL HIGH (ref 38–126)
BUN: 20 mg/dL (ref 8–23)
CO2: 22 mmol/L (ref 22–32)
CREATININE: 0.69 mg/dL (ref 0.61–1.24)
Calcium: 9.6 mg/dL (ref 8.9–10.3)
Chloride: 98 mmol/L (ref 98–111)
GFR, Estimated: >60 mL/min (ref >60)
Glucose, Bld: 147 mg/dL — ABNORMAL HIGH (ref 70–99)
Potassium: 3.9 mmol/L (ref 3.5–5.1)
Sodium: 131 mmol/L — ABNORMAL LOW (ref 135–145)
Total Bilirubin: 1.4 mg/dL — ABNORMAL HIGH (ref 0.3–1.2)
Total Protein: 7.9 g/dL (ref 6.5–8.1)

## 2018-01-28 LAB — CBC WITH DIFFERENTIAL (CANCER CENTER ONLY)
Abs Immature Granulocytes: 0.03 10*3/uL (ref 0.00–0.07)
Basophils Absolute: 0 10*3/uL (ref 0.0–0.1)
Basophils Relative: 0 %
Eosinophils Absolute: 0.2 10*3/uL (ref 0.0–0.5)
Eosinophils Relative: 2 %
HCT: 26.8 % — ABNORMAL LOW (ref 39.0–52.0)
Hemoglobin: 9 g/dL — ABNORMAL LOW (ref 13.0–17.0)
Immature Granulocytes: 0 %
Lymphocytes Relative: 11 %
Lymphs Abs: 0.9 10*3/uL (ref 0.7–4.0)
MCH: 29.4 pg (ref 26.0–34.0)
MCHC: 33.6 g/dL (ref 30.0–36.0)
MCV: 87.6 fL (ref 80.0–100.0)
MONO ABS: 0.7 10*3/uL (ref 0.1–1.0)
Monocytes Relative: 8 %
Neutro Abs: 6.7 10*3/uL (ref 1.7–7.7)
Neutrophils Relative %: 79 %
Platelet Count: 117 10*3/uL — ABNORMAL LOW (ref 150–400)
RBC: 3.06 MIL/uL — ABNORMAL LOW (ref 4.22–5.81)
RDW: 16.2 % — AB (ref 11.5–15.5)
WBC Count: 8.5 10*3/uL (ref 4.0–10.5)
nRBC: 0 % (ref 0.0–0.2)

## 2018-01-28 LAB — SAMPLE TO BLOOD BANK

## 2018-01-28 MED ORDER — HEPARIN SOD (PORK) LOCK FLUSH 100 UNIT/ML IV SOLN
500.0000 [IU] | Freq: Once | INTRAVENOUS | Status: AC | PRN
  Administered 2018-01-28: 500 [IU]
  Filled 2018-01-28: qty 5

## 2018-01-28 MED ORDER — SODIUM CHLORIDE 0.9% FLUSH
10.0000 mL | INTRAVENOUS | Status: DC | PRN
  Administered 2018-01-28: 10 mL
  Filled 2018-01-28: qty 10

## 2018-01-28 NOTE — Patient Instructions (Signed)

## 2018-01-28 NOTE — Progress Notes (Signed)
Per [name], Pt will not be needing blood transfusion. [name] at bedside to evaluate pt. Pt given a copy of lab work.

## 2018-01-29 DIAGNOSIS — C772 Secondary and unspecified malignant neoplasm of intra-abdominal lymph nodes: Secondary | ICD-10-CM | POA: Diagnosis not present

## 2018-01-29 DIAGNOSIS — E440 Moderate protein-calorie malnutrition: Secondary | ICD-10-CM | POA: Diagnosis not present

## 2018-01-29 DIAGNOSIS — I81 Portal vein thrombosis: Secondary | ICD-10-CM | POA: Diagnosis not present

## 2018-01-29 DIAGNOSIS — C7972 Secondary malignant neoplasm of left adrenal gland: Secondary | ICD-10-CM | POA: Diagnosis not present

## 2018-01-29 DIAGNOSIS — K831 Obstruction of bile duct: Secondary | ICD-10-CM | POA: Diagnosis not present

## 2018-01-29 DIAGNOSIS — Z1612 Extended spectrum beta lactamase (ESBL) resistance: Secondary | ICD-10-CM | POA: Diagnosis not present

## 2018-01-29 DIAGNOSIS — C7800 Secondary malignant neoplasm of unspecified lung: Secondary | ICD-10-CM | POA: Diagnosis not present

## 2018-01-29 DIAGNOSIS — A4151 Sepsis due to Escherichia coli [E. coli]: Secondary | ICD-10-CM | POA: Diagnosis not present

## 2018-01-29 DIAGNOSIS — I10 Essential (primary) hypertension: Secondary | ICD-10-CM | POA: Diagnosis not present

## 2018-01-29 DIAGNOSIS — I700 Atherosclerosis of aorta: Secondary | ICD-10-CM | POA: Diagnosis not present

## 2018-01-29 DIAGNOSIS — I959 Hypotension, unspecified: Secondary | ICD-10-CM | POA: Diagnosis not present

## 2018-01-29 DIAGNOSIS — C787 Secondary malignant neoplasm of liver and intrahepatic bile duct: Secondary | ICD-10-CM | POA: Diagnosis not present

## 2018-01-29 DIAGNOSIS — C7971 Secondary malignant neoplasm of right adrenal gland: Secondary | ICD-10-CM | POA: Diagnosis not present

## 2018-01-29 DIAGNOSIS — C221 Intrahepatic bile duct carcinoma: Secondary | ICD-10-CM | POA: Diagnosis not present

## 2018-01-29 DIAGNOSIS — E1142 Type 2 diabetes mellitus with diabetic polyneuropathy: Secondary | ICD-10-CM | POA: Diagnosis not present

## 2018-01-29 DIAGNOSIS — D649 Anemia, unspecified: Secondary | ICD-10-CM | POA: Diagnosis not present

## 2018-01-29 DIAGNOSIS — E1165 Type 2 diabetes mellitus with hyperglycemia: Secondary | ICD-10-CM | POA: Diagnosis not present

## 2018-01-29 DIAGNOSIS — N179 Acute kidney failure, unspecified: Secondary | ICD-10-CM | POA: Diagnosis not present

## 2018-01-29 DIAGNOSIS — I2510 Atherosclerotic heart disease of native coronary artery without angina pectoris: Secondary | ICD-10-CM | POA: Diagnosis not present

## 2018-01-29 DIAGNOSIS — K8309 Other cholangitis: Secondary | ICD-10-CM | POA: Diagnosis not present

## 2018-01-29 DIAGNOSIS — R6520 Severe sepsis without septic shock: Secondary | ICD-10-CM | POA: Diagnosis not present

## 2018-01-29 DIAGNOSIS — Z4803 Encounter for change or removal of drains: Secondary | ICD-10-CM | POA: Diagnosis not present

## 2018-01-29 DIAGNOSIS — Z452 Encounter for adjustment and management of vascular access device: Secondary | ICD-10-CM | POA: Diagnosis not present

## 2018-01-29 LAB — CANCER ANTIGEN 19-9: CA 19-9: 30 U/mL (ref 0–35)

## 2018-02-02 ENCOUNTER — Other Ambulatory Visit: Payer: Self-pay | Admitting: Radiology

## 2018-02-02 DIAGNOSIS — H5213 Myopia, bilateral: Secondary | ICD-10-CM | POA: Diagnosis not present

## 2018-02-02 DIAGNOSIS — H52223 Regular astigmatism, bilateral: Secondary | ICD-10-CM | POA: Diagnosis not present

## 2018-02-02 DIAGNOSIS — H524 Presbyopia: Secondary | ICD-10-CM | POA: Diagnosis not present

## 2018-02-03 ENCOUNTER — Other Ambulatory Visit: Payer: Self-pay

## 2018-02-03 ENCOUNTER — Ambulatory Visit (HOSPITAL_COMMUNITY)
Admission: RE | Admit: 2018-02-03 | Discharge: 2018-02-03 | Disposition: A | Discharge status: Home / Self Care | Payer: Medicare Other | Source: Ambulatory Visit | Attending: Interventional Radiology | Admitting: Interventional Radiology

## 2018-02-03 ENCOUNTER — Other Ambulatory Visit (HOSPITAL_COMMUNITY): Payer: Self-pay | Admitting: Interventional Radiology

## 2018-02-03 ENCOUNTER — Encounter (HOSPITAL_COMMUNITY): Payer: Self-pay

## 2018-02-03 DIAGNOSIS — Z7982 Long term (current) use of aspirin: Secondary | ICD-10-CM | POA: Diagnosis not present

## 2018-02-03 DIAGNOSIS — K7031 Alcoholic cirrhosis of liver with ascites: Secondary | ICD-10-CM | POA: Diagnosis not present

## 2018-02-03 DIAGNOSIS — Z794 Long term (current) use of insulin: Secondary | ICD-10-CM | POA: Diagnosis not present

## 2018-02-03 DIAGNOSIS — I10 Essential (primary) hypertension: Secondary | ICD-10-CM | POA: Diagnosis not present

## 2018-02-03 DIAGNOSIS — C221 Intrahepatic bile duct carcinoma: Secondary | ICD-10-CM | POA: Diagnosis not present

## 2018-02-03 DIAGNOSIS — M1990 Unspecified osteoarthritis, unspecified site: Secondary | ICD-10-CM | POA: Insufficient documentation

## 2018-02-03 DIAGNOSIS — K831 Obstruction of bile duct: Secondary | ICD-10-CM | POA: Insufficient documentation

## 2018-02-03 DIAGNOSIS — F4310 Post-traumatic stress disorder, unspecified: Secondary | ICD-10-CM | POA: Insufficient documentation

## 2018-02-03 DIAGNOSIS — Z85038 Personal history of other malignant neoplasm of large intestine: Secondary | ICD-10-CM | POA: Insufficient documentation

## 2018-02-03 DIAGNOSIS — E1140 Type 2 diabetes mellitus with diabetic neuropathy, unspecified: Secondary | ICD-10-CM | POA: Insufficient documentation

## 2018-02-03 DIAGNOSIS — Z79899 Other long term (current) drug therapy: Secondary | ICD-10-CM | POA: Insufficient documentation

## 2018-02-03 DIAGNOSIS — Z4803 Encounter for change or removal of drains: Secondary | ICD-10-CM | POA: Insufficient documentation

## 2018-02-03 HISTORY — PX: IR EXCHANGE BILIARY DRAIN: IMG6046

## 2018-02-03 LAB — CBC WITH DIFFERENTIAL/PLATELET
Abs Immature Granulocytes: 0.44 10*3/uL — ABNORMAL HIGH (ref 0.00–0.07)
Basophils Absolute: 0.1 10*3/uL (ref 0.0–0.1)
Basophils Relative: 0 %
Eosinophils Absolute: 0.2 10*3/uL (ref 0.0–0.5)
Eosinophils Relative: 1 %
HCT: 30.8 % — ABNORMAL LOW (ref 39.0–52.0)
Hemoglobin: 9.8 g/dL — ABNORMAL LOW (ref 13.0–17.0)
Immature Granulocytes: 2 %
Lymphocytes Relative: 10 %
Lymphs Abs: 2 10*3/uL (ref 0.7–4.0)
MCH: 29.2 pg (ref 26.0–34.0)
MCHC: 31.8 g/dL (ref 30.0–36.0)
MCV: 91.7 fL (ref 80.0–100.0)
MONOS PCT: 9 %
Monocytes Absolute: 1.7 10*3/uL — ABNORMAL HIGH (ref 0.1–1.0)
Neutro Abs: 14.7 10*3/uL — ABNORMAL HIGH (ref 1.7–7.7)
Neutrophils Relative %: 78 %
Platelets: 340 10*3/uL (ref 150–400)
RBC: 3.36 MIL/uL — ABNORMAL LOW (ref 4.22–5.81)
RDW: 16.5 % — ABNORMAL HIGH (ref 11.5–15.5)
WBC: 19 10*3/uL — ABNORMAL HIGH (ref 4.0–10.5)
nRBC: 0 % (ref 0.0–0.2)

## 2018-02-03 LAB — COMPREHENSIVE METABOLIC PANEL
ALT: 60 U/L — AB (ref 0–44)
AST: 48 U/L — ABNORMAL HIGH (ref 15–41)
Albumin: 3.5 g/dL (ref 3.5–5.0)
Alkaline Phosphatase: 446 U/L — ABNORMAL HIGH (ref 38–126)
Anion gap: 10 (ref 5–15)
BUN: 19 mg/dL (ref 8–23)
CO2: 21 mmol/L — ABNORMAL LOW (ref 22–32)
Calcium: 9.5 mg/dL (ref 8.9–10.3)
Chloride: 98 mmol/L (ref 98–111)
Creatinine, Ser: 0.63 mg/dL (ref 0.61–1.24)
GFR calc Af Amer: >60 mL/min (ref >60)
Glucose, Bld: 80 mg/dL (ref 70–99)
Potassium: 3.9 mmol/L (ref 3.5–5.1)
Sodium: 129 mmol/L — ABNORMAL LOW (ref 135–145)
Total Bilirubin: 1.5 mg/dL — ABNORMAL HIGH (ref 0.3–1.2)
Total Protein: 8.8 g/dL — ABNORMAL HIGH (ref 6.5–8.1)

## 2018-02-03 LAB — PROTIME-INR
INR: 1.11
Prothrombin Time: 14.2 seconds (ref 11.4–15.2)

## 2018-02-03 LAB — GLUCOSE, CAPILLARY: Glucose-Capillary: 61 mg/dL — ABNORMAL LOW (ref 70–99)

## 2018-02-03 MED ORDER — CIPROFLOXACIN IN D5W 400 MG/200ML IV SOLN
INTRAVENOUS | Status: AC
  Administered 2018-02-03: 400 mg via INTRAVENOUS
  Filled 2018-02-03: qty 200

## 2018-02-03 MED ORDER — LIDOCAINE HCL 1 % IJ SOLN
INTRAMUSCULAR | Status: AC
  Filled 2018-02-03: qty 20

## 2018-02-03 MED ORDER — SODIUM CHLORIDE 0.9 % IV SOLN
INTRAVENOUS | Status: DC
  Administered 2018-02-03: 13:00:00 via INTRAVENOUS

## 2018-02-03 MED ORDER — DEXTROSE 50 % IV SOLN
INTRAVENOUS | Status: AC
  Administered 2018-02-03: 25 mL via INTRAVENOUS
  Filled 2018-02-03: qty 50

## 2018-02-03 MED ORDER — MIDAZOLAM HCL 2 MG/2ML IJ SOLN
INTRAMUSCULAR | Status: AC
  Filled 2018-02-03: qty 4

## 2018-02-03 MED ORDER — FENTANYL CITRATE (PF) 100 MCG/2ML IJ SOLN
INTRAMUSCULAR | Status: AC | PRN
  Administered 2018-02-03 (×2): 50 ug via INTRAVENOUS

## 2018-02-03 MED ORDER — FENTANYL CITRATE (PF) 100 MCG/2ML IJ SOLN
INTRAMUSCULAR | Status: AC
  Filled 2018-02-03: qty 2

## 2018-02-03 MED ORDER — MIDAZOLAM HCL 2 MG/2ML IJ SOLN
INTRAMUSCULAR | Status: AC | PRN
  Administered 2018-02-03 (×2): 1 mg via INTRAVENOUS

## 2018-02-03 MED ORDER — DEXTROSE 5 % IV SOLN
INTRAVENOUS | Status: DC
  Administered 2018-02-03: 14:00:00 via INTRAVENOUS

## 2018-02-03 MED ORDER — IOPAMIDOL (ISOVUE-300) INJECTION 61%
50.0000 mL | Freq: Once | INTRAVENOUS | Status: AC | PRN
  Administered 2018-02-03: 20 mL

## 2018-02-03 MED ORDER — CIPROFLOXACIN IN D5W 400 MG/200ML IV SOLN
400.0000 mg | INTRAVENOUS | Status: AC
  Administered 2018-02-03: 400 mg via INTRAVENOUS

## 2018-02-03 MED ORDER — HEPARIN SOD (PORK) LOCK FLUSH 100 UNIT/ML IV SOLN
500.0000 [IU] | Freq: Once | INTRAVENOUS | Status: AC
  Administered 2018-02-03: 500 [IU]
  Filled 2018-02-03: qty 5

## 2018-02-03 MED ORDER — IOPAMIDOL (ISOVUE-300) INJECTION 61%
INTRAVENOUS | Status: AC
  Filled 2018-02-03: qty 50

## 2018-02-03 MED ORDER — DEXTROSE 50 % IV SOLN
25.0000 mL | Freq: Once | INTRAVENOUS | Status: AC
  Administered 2018-02-03: 25 mL via INTRAVENOUS

## 2018-02-03 NOTE — Procedures (Signed)
Biliary obstruction  S/p bilateral biliary drain exchgs No comp Stable EBL 0 Full report in pacs

## 2018-02-03 NOTE — H&P (Signed)
Referring Physician(s): Feng,Y  Supervising Physician: Daryll Brod  Patient Status:  [facility]  Chief Complaint:  Metastatic cholangiocarcinoma, biliary obstruction  Subjective: Patient familiar to IR service from prior right  internal/external biliary drain placement on 10/26/2017, right adrenal gland mass biopsy on 10/29/2017, right biliary drain exchanges on 11/08/2017 and on 12/09/2017, port a cath placement on 12/09/2017, and left internal/external biliary drain placement on 12/23/2017. He has a history of metastatic cholangiocarcinoma with biliary obstruction.  He presents again today for follow-up cholangiogram with biliary drain exchanges.   Both drains are currently to gravity bag.  He denies fever, headache, chest pain, dyspnea, cough, abdominal/back pain, nausea, vomiting or bleeding.  Past Medical History:  Diagnosis Date  . Abscess of anal and rectal regions    horse shoe abscess  . Alcoholic cirrhosis ([place])   . Arthritis    "everywhere; especially in my spine"  . Cirrhosis ([place])    STAGE 1 CIRRHOSIS PATIENT SEES DR NAMDIGAM FOR  . Colon cancer ([place]) 05/2005   TX SURGERY WITH LYMPH NODE REMOVAL  . Cough    LAST FEW WEEKS SAW DR RAMOS LUNGS MILKY SPUTUM OCC  . DM type 2 with diabetic peripheral neuropathy ([facility])    left foot  . Fatigue   . Fistula, anal    multiple  . History of substance abuse ([place])    last alcohol was 08/2011; last marijuania was 08/2011  . Hypertension   . PTSD (post-traumatic stress disorder)    "severe" HX OF  . Sleep deprivation    Past Surgical History:  Procedure Laterality Date  . ABSCESS DRAINAGE     "probably 15-20 so far at least; rectal"  . ANAL FISTULECTOMY  12/13/1998  . ANTERIOR FUSION CERVICAL SPINE  2010   "triple"; "did 2 surgeries on the same day in 1308 due to complications"  . CORONARY ANGIOPLASTY  2001  . detached muscle  2010   right chest; "after cervical fusion complications"  . ELBOW SURGERY  2007   "cut out part of  a muscle"; left  . EXAMINATION UNDER ANESTHESIA  03/22/2005   fistula  . EXAMINATION UNDER ANESTHESIA  06/06/2011   Procedure: EXAM UNDER ANESTHESIA;  Surgeon: Stark Klein, MD;  Location[place];  Service: General;  Laterality: N/A;  . HEMICOLECTOMY  06/06/2005   left  . INCISION AND DRAINAGE PERIRECTAL ABSCESS  03/30/2005  . INCISION AND DRAINAGE PERIRECTAL ABSCESS  12/13/2004  . INCISION AND DRAINAGE PERIRECTAL ABSCESS  07/14/2001  . INCISION AND DRAINAGE PERIRECTAL ABSCESS  08/12/2010   horseshoe abscess; Dr Redmond Pulling  . INCISION AND DRAINAGE PERIRECTAL ABSCESS  06/06/2011   Procedure: IRRIGATION AND DEBRIDEMENT PERIRECTAL ABSCESS;  Surgeon: Stark Klein, MD;  Location: [place];  Service: General;  Laterality: N/A;  . INCISION AND DRAINAGE PERIRECTAL ABSCESS N/A 02/24/2017   Procedure: IRRIGATION AND DEBRIDEMENT PERIRECTAL ABSCESS;  Surgeon: Ralene Ok, MD;  Location: [place];  Service: General;  Laterality: N/A;  . IR CHOLANGIOGRAM EXISTING TUBE  12/23/2017  . IR CHOLANGIOGRAM EXISTING TUBE  01/02/2018  . IR EXCHANGE BILIARY DRAIN  11/07/2017  . IR EXCHANGE BILIARY DRAIN  12/09/2017  . IR IMAGING GUIDED PORT INSERTION  12/09/2017  . IR INT EXT BILIARY DRAIN WITH CHOLANGIOGRAM  10/26/2017  . IR INT EXT BILIARY DRAIN WITH CHOLANGIOGRAM  12/23/2017  . IR PATIENT EVAL TECH 0-60 MINS  12/02/2017  . IR RADIOLOGIST EVAL & MGMT  11/06/2017  . IR RADIOLOGIST EVAL & MGMT  01/05/2018  .  [place]  . PERCUTANEOUS PINNING PHALANX FRACTURE OF HAND  ~ 2008   "plates in 2 places"; right  . PLACEMENT OF SETON N/A 09/05/2017   Procedure: PLACEMENT OF SETON;  Surgeon: Leighton Ruff, MD;  Location: [facility];  Service: General;  Laterality: N/A;  . RADIOLOGY WITH ANESTHESIA N/A 10/25/2017   Procedure: MRI WITH ANESTHESIA;  Surgeon: Radiologist, Medication, MD;  Location: [place];  Service: Radiology;  Laterality: N/A;  . THORACIC DISCECTOMY  1990's      Allergies: Morphine and  related  Medications: Prior to Admission medications   Medication Sig Start Date End Date Taking? Authorizing Provider  aspirin EC 81 MG tablet Take 81 mg by mouth daily.   Yes [provider]  Ca Carbonate-Mag Hydroxide (ROLAIDS PO) Take 1-2 tablets by mouth 2 (two) times daily as needed (heartburn).   Yes [provider]  celecoxib (CELEBREX) 200 MG capsule Take 200 mg by mouth at bedtime.  10/10/17  Yes [provider]  fexofenadine (ALLEGRA) 180 MG tablet Take 180 mg by mouth at bedtime.   Yes [provider]  gabapentin (NEURONTIN) 400 MG capsule Take 400-800 mg by mouth See admin instructions. Take 400 mg by mouth in the morning and 800 mg in the evening   Yes [provider]  insulin detemir (LEVEMIR) 100 UNIT/ML injection Inject into the skin daily.   Yes [provider]  lactose free nutrition (BOOST PLUS) LIQD Take 237 mLs by mouth 3 (three) times daily with meals. 12/11/17  Yes Regalado, Belkys A, MD  lidocaine-prilocaine (EMLA) cream Apply to port site 1 hour prior to access 01/20/18  Yes [name], MD  Multiple Vitamin (MULTIVITAMIN) tablet Take 1 tablet by mouth daily.   Yes [provider]  ondansetron (ZOFRAN) 8 MG tablet Take 1 tablet (8 mg total) by mouth 2 (two) times daily as needed. Start on the third day after chemotherapy. 11/06/17  Yes [name], MD  tamsulosin (FLOMAX) 0.4 MG CAPS capsule Take 1 capsule (0.4 mg total) by mouth daily after supper. 12/30/17  Yes Dana Allan I, MD     Vital Signs: BP 102/70   Pulse 94   Temp 98 F (36.7 C) (Oral)   Resp 18   SpO2 100%   Physical Exam awake, alert.  Chest clear to auscultation bilaterally.  Clean, intact right chest wall Port-A-Cath.  Heart with regular rate and rhythm.  Abdomen soft, positive bowel sounds, intact right and left biliary drains.  No significant lower extremity edema  Imaging: No results found.  Labs:  CBC: Recent Labs     01/15/18 1405 01/22/18 1150 01/28/18 0829 02/03/18 1315  WBC 9.1 3.9* 8.5 19.0*  HGB 11.0* 8.5* 9.0* 9.8*  HCT 34.2* 25.8* 26.8* 30.8*  PLT 291 88* 117* 340    COAGS: Recent Labs    12/22/17 1004 12/23/17 0452 01/15/18 1405 02/03/18 1315  INR 1.15 1.18 1.19 1.11    BMP: Recent Labs    01/13/18 1117 01/15/18 1405 01/22/18 1150 01/28/18 0829  NA 132* 132* 128* 131*  K 4.6 4.5 4.6 3.9  CL 101 102 100 98  CO2 22 22 22 22   GLUCOSE 134* 138* 177* 147*  BUN 17 24* 19 20  CALCIUM 10.0 9.2 9.2 9.6  CREATININE 0.76 0.77 0.69 0.69  GFRNONAA >60 >60 >60 >60  GFRAA >60 >60 >60 >60    LIVER FUNCTION TESTS: Recent Labs    01/13/18 1117 01/15/18 1405  01/22/18 1150 01/28/18 0829  BILITOT 1.8* 1.9* 1.8* 1.4*  AST 73* 119* 38 38  ALT 74* 128* 54* 66*  ALKPHOS 317* 271* 348* 411*  PROT 8.7* 8.8* 7.6 7.9  ALBUMIN 3.2* 3.6 2.7* 2.9*    Assessment and Plan: Pt with history of metastatic cholangiocarcinoma with biliary obstruction.  S/p right internal/external biliary drain placement on 10/26/2017, right adrenal gland mass biopsy on 10/29/2017, right biliary drain exchanges on 11/08/2017 and  12/09/2017, port a cath placement on 12/09/2017, and left internal/external biliary drain placement on 12/23/2017. He presents again today for follow-up cholangiogram with biliary drain exchanges.  Details/risks of procedures, including but not limited to, internal bleeding, infection, injury to adjacent structures discussed with patient with his understanding and consent.  LABS PENDING   Electronically Signed: D. Rowe Robert, PA-C 02/03/2018, 1:48 PM   I spent a total of 20 minutes at the the patient's bedside AND on the patient's hospital floor or unit, greater than 50% of which was counseling/coordinating care for cholangiogram with biliary drain exchanges

## 2018-02-03 NOTE — Discharge Instructions (Signed)
Moderate Conscious Sedation, Adult, Care After These instructions provide you with information about caring for yourself after your procedure. Your health care provider may also give you more specific instructions. Your treatment has been planned according to current medical practices, but problems sometimes occur. Call your health care provider if you have any problems or questions after your procedure. What can I expect after the procedure? After your procedure, it is common:  To feel sleepy for several hours.  To feel clumsy and have poor balance for several hours.  To have poor judgment for several hours.  To vomit if you eat too soon. Follow these instructions at home: For at least 24 hours after the procedure:   Do not: ? Participate in activities where you could fall or become injured. ? Drive. ? Use heavy machinery. ? Drink alcohol. ? Take sleeping pills or medicines that cause drowsiness. ? Make important decisions or sign legal documents. ? Take care of children on your own.  Rest. Eating and drinking  Follow the diet recommended by your health care provider.  If you vomit: ? Drink water, juice, or soup when you can drink without vomiting. ? Make sure you have little or no nausea before eating solid foods. General instructions  Have a responsible adult stay with you until you are awake and alert.  Take over-the-counter and prescription medicines only as told by your health care provider.  If you smoke, do not smoke without supervision.  Keep all follow-up visits as told by your health care provider. This is important. Contact a health care provider if:  You keep feeling nauseous or you keep vomiting.  You feel light-headed.  You develop a rash.  You have a fever. Get help right away if:  You have trouble breathing. This information is not intended to replace advice given to you by your health care provider. Make sure you discuss any questions you have  with your health care provider. Document Released: 10/14/2012 Document Revised: 05/29/2015 Document Reviewed: 04/15/2015 Elsevier Interactive Patient Education  2019 [employer]. Biliary Drainage Catheter Home Guide A biliary drainage catheter is a thin, flexible tube that is inserted through your skin into the bile ducts in your liver. Bile is a thick yellow or green fluid that helps digest fat in foods. The purpose of a biliary drainage catheter is to keep bile from backing up into your liver. Backup of bile can occur when there is a blockage that prevents bile from moving from the bile ducts into the small intestine as it should. The blockage can be caused by gallstones, a tumor, or scar tissue. There are three types of biliary drainage:  External biliary drainage. With this type, bile is only drained into a collection bag outside your body (external collection bag).  Internal-external biliary drainage. With this type, bile is drained to an external collection bag as well as into your small intestine.  Internal biliary drainage. With this type, bile is only drained into your small intestine. General home care includes these daily actions:   Inspection of your drainage catheter.  Flushing your drainage catheter with saline.  Emptying drainage from the collection bag (if present).  Recording the amount of drainage.  Checking the catheter insertion site for signs of infection. Check for: ? Redness, swelling, or pain. ? Fluid or blood. ? Warmth. ? Pus or a bad smell. How do I inspect my drainage catheter?  Check the dressing to make sure that it is dry and clean.  Look  at the skin around the drainage catheter when changing the dressing for any problems such as redness, rash, or skin breakdown.  Check the drainage bag to make sure that drainage fluid is flowing into the bag well. Note the color and amount compared to other days.  Check the drainage catheter and bag for any cracks  or kinks in the tubing. How do I change my dressing? The dressing over the drainage catheter should be changed every other day, or more often if needed to keep the dressing dry. Your health care provider will instruct you about how often to change your dressing. Supplies needed:  Mild soap and warm water.  Split gauze pads, 4 x 4 inches (10 x 10 cm) to use as a dressing sponge.  Gauze pads, 4 x 4 inches (10 x 10 cm) or adhesive dressing cover.  Paper tape. How to change the dressing: 1. Wash your hands with soap and water. 2. Gently remove the old dressing. Avoid using scissors to remove the dressing because they may damage the drainage catheter. 3. Wash the skin around the insertion site with mild soap and warm water, rinse well, then pat the area dry with a clean cloth. 4. Check the skin around the drainage catheter for redness or swelling, or for yellow or green discharge that has a bad smell. 5. If the drainage catheter was stitched (sutured) to the skin, inspect the suture to make sure it is still anchored in the skin. 6. Do not apply creams, ointments, or alcohol to the site. Allow the skin to air-dry completely before you apply a new dressing. 7. Place the drainage catheter through the slit in a dressing sponge. The dressing sponge should slide under the disk that holds the drainage catheter in place. 8. Cover the drainage catheter and the dressing sponge with a 4 x 4 inch (10 x 10 cm) gauze. The drainage catheter should rest on the gauze and not on the skin. 9. Tape the dressing to the skin. 10. You may be instructed to use an adhesive dressing covering over the top of this in place of the gauze and tape. 11. Wash your hands with soap and water. How do I flush my drainage catheter? Biliary drainagecatheters should be flushed daily, or as often as told by your health care provider. The end of the drainage catheter is closed using an IV cap. A syringe can be directly connected to the  IV cap. Supplies needed:  Alcohol swab.  10 mL prefilled normal saline syringe. How to flush the drainage catheter: 1. Wash your hands with soap and water. 2. If your drainage catheter has a stopcock attached to it, turn the stopcock toward the drainage bag. This will allow the saline to flow in the direction of your body. 3. Clean the IV cap with an alcohol swab. 4. Screw the tip of a 10 mL normal saline syringe onto the IV cap. 5. Inject the saline over 5-10 seconds. If you feel resistance while injecting, stop immediately. Avoid  pulling back on the plunger. Doing that could increase your risk of infection. 6. Remove the syringe from the cap. Turn the stopcock so that fluid flows from your body into the drainage bag. You may notice more fluid flowing into the bag after you have completed the flush. How do I attach a bag to my drainage catheter? If you are having trouble with your internal biliary drain, you may be directed by your health care provider to use bag  drainage until you can be seen to fix the problem. For this reason, you should always have a collection bag and connecting tubing at home. If you do not have these supplies, remember to ask for them at your next appointment. 1. Remove the bag and the connecting tubing from their packaging. 2. Connect the funnel end of the tubing to the bag's cone-shaped stem. 3. Remove the IV cap from the biliary drain. To do this, unscrew it and replace it with the screw-on end of the tubing. 4. Save the IV cap in a plastic storage bag that can be sealed. How do I empty my collection bag? Empty the collection bag whenever it becomes 2/3 full. Also empty it before you go to sleep. Most collection bags have a drainage valve at the bottom so the bag can be that allows them to be emptied easily. 1. Wash your hands with soap and water. 2. Hold the collection bag over the toilet, basin, or collection container. Use a measuring container if your health  care provider told you to measure the drainage. 3. Unscrew the valve to open it, and allow the bag to drain. 4. Close the valve securely to avoid leakage. 5. Use a tissue or disposable napkin to wipe the valve clean. 6. Wash the measuring container with soap and water. 7. Record the amount of drainage as told by your health care provider. Contact a health care provider if:  Your pain gets worse after it had improved, and it is not relieved with pain medicines.  You have any questions about caring for your drainage catheter or collection bag.  You have any of these around your catheter insertion site or coming from it: ? Skin breakdown. ? Redness, swelling, or pain. ? Fluid or blood. ? Warmth to the touch. ? Pus or a bad smell. Get help right away if:  You have a fever or chills.  Your redness, swelling, or pain at the catheter insertion site gets worse, even though you are cleaning it well.  You have leakage of bile around the drainage catheter.  Your drainage catheter becomes blocked or clogged.  Your drainage catheter comes out. This information is not intended to replace advice given to you by your health care provider. Make sure you discuss any questions you have with your health care provider. Document Released: 10/14/2012 Document Revised: 11/13/2015 Document Reviewed: 11/13/2015 Elsevier Interactive Patient Education  2019 [employer].

## 2018-02-04 ENCOUNTER — Other Ambulatory Visit: Payer: Self-pay

## 2018-02-04 ENCOUNTER — Other Ambulatory Visit: Payer: Self-pay | Admitting: Hematology

## 2018-02-04 DIAGNOSIS — N400 Enlarged prostate without lower urinary tract symptoms: Secondary | ICD-10-CM

## 2018-02-04 MED ORDER — TAMSULOSIN HCL 0.4 MG PO CAPS: 0.4000 mg | ORAL_CAPSULE | Freq: Every day | ORAL | 0 refills | Status: DC

## 2018-02-04 NOTE — Progress Notes (Signed)
[place]   Telephone:([phone] Fax:([phone]   Clinic Follow up Note   Patient Care Team: [name], MD as PCP - General (Internal Medicine) 02/05/2018  CHIEF COMPLAINT: F/u on metastatic cholangiocarcinoma   SUMMARY OF ONCOLOGIC HISTORY: Oncology History   Cancer Staging Intrahepatic cholangiocarcinoma ([place]) Staging form: Intrahepatic Bile Duct, AJCC 8th Edition - Clinical stage from 10/29/2017: Stage IV (cT2, cN1, pM1) - Signed by [name], MD on 11/06/2017       Malignant neoplasm metastatic to adrenal gland [facility])    Initial Diagnosis    Malignant neoplasm metastatic to adrenal gland ([place])    10/23/2017 Imaging    ABD US IMPRESSION: Cirrhotic appearance of the liver.  2.6 cm questionable ill-defined mass versus area of focal parenchymal heterogeneity in segment 6 of the liver. If further imaging evaluation is desired, liver protocol abdominal MRI should be considered.  Splenomegaly.  Apparent thickening of the gallbladder wall, without evidence of cholelithiasis, and with negative sonographic Murphy's sign. The findings are favored to represent gallbladder wall edema secondary to systemic causes such as hyperproteinemia rather than acute cholecystitis. Please correlate clinically.     10/25/2017 Imaging    MRI IMPRESSION: 1. Advanced changes of cirrhosis. Interval development of multifocal enhancing liver lesions, which is concerning for multicentric hepatocellular carcinoma involving both lobes of liver. 2. Lesion within segment 4 B obstructs the right medial hepatic duct and left main duct resulting in moderate to marked biliary ductal dilatation within the left lobe and dome of liver. There may be partial obstruction of the right lateral duct with mild dilatation. 3. Tumor thrombus within the left branch of the portal vein likely secondary to segment 4 B tumor. 4. Bilateral adrenal metastasis and retroperitoneal  nodal metastasis. 5. Stigmata of portal venous hypertension including varices, ascites and splenomegaly. 6. These results will be called to the ordering clinician or representative by the Radiologist Assistant, and communication documented in the PACS or zVision Dashboard.    10/26/2017 Procedure    IMPRESSION: Status post formal PTC and internal/external biliary drainage.    10/27/2017 Imaging    IMPRESSION: Changes of cirrhosis. Multiple hepatic lesions are noted concerning for multifocal hepatoma or metastases. Biliary drain is in place with the tip in the distal common bile duct. There is mild intrahepatic biliary ductal dilatation. Bilateral adrenal nodules and masses, right larger than left compatible with metastases. Retroperitoneal adenopathy. Large stool burden with mild gaseous distention of the colon. Aortic atherosclerosis.     10/29/2017 Pathology Results    Diagnosis Soft Tissue Needle Core Biopsy, right adrenal mass - METASTATIC ADENOCARCINOMA, SEE COMMENT. Microscopic Comment There are malignant glands with central necrosis. Immunohistochemistry is positive for cytokeratin 20 and CDX-2. Cytokeratin 7, TTF-1 and NapsinA are negative. The morphology and immunoprofile favor a colorectal primary over pancreaticobiliary. [name] has reviewed the case. [name] was notified on 10/31/2017.    11/04/2017 Procedure    Colonoscopy impression - Seton in place found on digital rectal exam. - Congested mucosa in the rectum, in the sigmoid colon and in the descending colon. - A few diminutive polyps in the rectum and in the sigmoid colon. No colon or rectal mass identified. - No specimens collected.     Intrahepatic cholangiocarcinoma ([place])   10/27/2017 Imaging    10/27/2017 CT AP IMPRESSION: Changes of cirrhosis. Multiple hepatic lesions are noted concerning for multifocal hepatoma or metastases.  Biliary drain is in place with the tip in the distal common  bile  duct. There is mild intrahepatic biliary ductal dilatation.  Bilateral adrenal nodules and masses, right larger than left compatible with metastases.  Retroperitoneal adenopathy.  Large stool burden with mild gaseous distention of the colon.  Aortic atherosclerosis.    10/29/2017 Cancer Staging    Staging form: Intrahepatic Bile Duct, AJCC 8th Edition - Clinical stage from 10/29/2017: Stage IV (cT2, cN1, pM1) - Signed by [name], MD on 11/06/2017    10/29/2017 Pathology Results    10/29/2017 Surgical Pathology Diagnosis Soft Tissue Needle Core Biopsy, right adrenal mass - METASTATIC ADENOCARCINOMA, SEE COMMENT.    11/06/2017 Initial Diagnosis    Intrahepatic cholangiocarcinoma ([place])    11/06/2017 Imaging    11/06/2017 CT AP IMPRESSION: 1. Increase in RIGHT pleural effusion. Stable masslike consolidation at the RIGHT lung base representing neoplasm versus pneumonia. 2. Stable metastatic nodule at the RIGHT lung base. 3. Infiltrative mass in the central LEFT hepatic lobe and discrete lesions in the RIGHT hepatic lobe consistent with metastatic disease versus primary hepatic biliary carcinoma. Favor cholangiocarcinoma. 4. Percutaneous drainage catheter extends the porta hepatis. Small trapped pneumothorax along the tract of the catheter at the inferolateral RIGHT lung base. 5. Stable adrenal metastasis. 6. Metastatic adenopathy in the retroperitoneum unchanged. 7. Morphologic changes in liver consistent with  cirrhosis.    11/08/2017 Imaging    11/08/2017 CT AP IMPRESSION: 1. Overall no significant interval change since the CT of 11/06/2017. Stable positioning of the percutaneous biliary drain with similar degree of biliary ductal dilatation. 2. Cirrhosis with multiple hepatic metastatic disease. Infiltrative appearing lesion in the left lobe of the liver appears to extend into the left portal vein and may represent a tumor thrombus, or bland thrombus. A  primary biliary neoplasm (cholangiocarcinoma, or metastatic disease are other considerations. 3. Retroperitoneal, mesenteric, and adrenal metastatic disease. 4. Hepatic colopathy versus less likely segmental colitis of the hepatic flexure. 5. Small right pleural effusion and bilateral lung base pulmonary masses/nodules as seen previously. 6. Tiny pockets of pneumoperitoneum lateral to the liver along the percutaneous drain, decreased since the prior CT. 7. Other findings as above.    11/20/2017 - 11/24/2017 Hospital Admission    Admit date: 11/08/2017 Admission diagnosis: fatigue Additional comments: discharged 11/24/2017    12/18/2017 -  Chemotherapy    first line chemo gemcitabine/cisplatin on days 1 and 15 every 28 days starting 12/18/17    12/22/2017 - 12/30/2017 Hospital Admission    Admit date: 12/22/2017 Admission diagnosis: Dysuria    12/22/2017 Imaging    12/22/2017 CT AP IMPRESSION: 1. Prominently distended and otherwise normal urinary bladder with mild prostatomegaly. Findings suggest acute bladder outlet obstruction. No hydronephrosis. 2. Large colorectal stool volume compatible with constipation. New mild circumferential rectal wall thickening and perirectal fat haziness suggesting stercoral colitis. No free air. No abscess. 3. Stable infiltrative cholangiocarcinoma in the central left liver lobe with associated peripheral intrahepatic biliary ductal dilatation throughout the left liver lobe. 4. Stable well-positioned internal external drain traversing the right liver lobe and decompressing the right liver biliary system. 5. Stable liver, bilateral adrenal, retroperitoneal, left mesenteric and right infrahilar nodal and left lung base metastases. No progressive metastatic disease since 12/03/2017 CT. 6. Cirrhosis. Trace perihepatic ascites, decreased. Stable moderate splenomegaly. 7.  Aortic Atherosclerosis (ICD10-I70.0).     CURRENT THERAPY  first line  chemo gemcitabine/cisplatin on days 1 and8every 21days started12/12/19  INTERVAL HISTORY: Matthew Lewis is a 63 y.o. male who is here for follow-up on metastatic cholangiocarcinoma. The patient informed Matthew Lewis on  01/23/2018 that overnight his weight changed from 196 to 189. He later saw a nutritionist. He previously called and mentioned he had a high biliary drain output.  Today, he is here alone. Pt is emotionally distressed about hospital billing. He describes last chemotherapy as going well. He is doing physical well. Pt appitite is normal. He previously had 2 drain tubes but had 1 removed. He had a leak from the removed tube and he currently drains 1 liter a day from the tube that remains.   Pertinent positives and negatives of review of systems are listed and detailed within the above HPI.  REVIEW OF SYSTEMS:   Constitutional: Denies fevers, chills or abnormal weight loss Eyes: Denies blurriness of vision Ears, nose, mouth, throat, and face: Denies mucositis or sore throat Respiratory: Denies cough, dyspnea or wheezes Cardiovascular: Denies palpitation, chest discomfort or lower extremity swelling Gastrointestinal:  Denies nausea, heartburn or change in bowel habits, (+) drainage 1L from current tube, (+) leak from removed tube  Skin: Denies abnormal skin rashes Lymphatics: Denies new lymphadenopathy or easy bruising Neurological:Denies numbness, tingling or new weaknesses Behavioral/Psych: Mood is stable, no new changes  All other systems were reviewed with the patient and are negative.  MEDICAL HISTORY:  Past Medical History:  Diagnosis Date  . Abscess of anal and rectal regions    horse shoe abscess  . Alcoholic cirrhosis ([place])   . Arthritis    "everywhere; especially in my spine"  . Cirrhosis ([place])    STAGE 1 CIRRHOSIS PATIENT SEES DR NAMDIGAM FOR  . Colon cancer ([place]) 05/2005   TX SURGERY WITH LYMPH NODE REMOVAL  . Cough    LAST FEW WEEKS SAW DR RAMOS LUNGS  MILKY SPUTUM OCC  . DM type 2 with diabetic peripheral neuropathy ([facility])    left foot  . Fatigue   . Fistula, anal    multiple  . History of substance abuse ([place])    last alcohol was 08/2011; last marijuania was 08/2011  . Hypertension   . PTSD (post-traumatic stress disorder)    "severe" HX OF  . Sleep deprivation     SURGICAL HISTORY: Past Surgical History:  Procedure Laterality Date  . ABSCESS DRAINAGE     "probably 15-20 so far at least; rectal"  . ANAL FISTULECTOMY  12/13/1998  . ANTERIOR FUSION CERVICAL SPINE  2010   "triple"; "did 2 surgeries on the same day in 7510 due to complications"  . CORONARY ANGIOPLASTY  2001  . detached muscle  2010   right chest; "after cervical fusion complications"  . ELBOW SURGERY  2007   "cut out part of a muscle"; left  . EXAMINATION UNDER ANESTHESIA  03/22/2005   fistula  . EXAMINATION UNDER ANESTHESIA  06/06/2011   Procedure: EXAM UNDER ANESTHESIA;  Surgeon: Stark Klein, MD;  Location: [facility];  Service: General;  Laterality: N/A;  . HEMICOLECTOMY  06/06/2005   left  . INCISION AND DRAINAGE PERIRECTAL ABSCESS  03/30/2005  . INCISION AND DRAINAGE PERIRECTAL ABSCESS  12/13/2004  . INCISION AND DRAINAGE PERIRECTAL ABSCESS  07/14/2001  . INCISION AND DRAINAGE PERIRECTAL ABSCESS  08/12/2010   horseshoe abscess; Dr Redmond Pulling  . INCISION AND DRAINAGE PERIRECTAL ABSCESS  06/06/2011   Procedure: IRRIGATION AND DEBRIDEMENT PERIRECTAL ABSCESS;  Surgeon: Stark Klein, MD;  Location: [facility];  Service: General;  Laterality: N/A;  . INCISION AND DRAINAGE PERIRECTAL ABSCESS N/A 02/24/2017   Procedure: IRRIGATION AND DEBRIDEMENT PERIRECTAL ABSCESS;  Surgeon: Ralene Ok, MD;  Location: [place];  Service: General;  Laterality: N/A;  . IR CHOLANGIOGRAM EXISTING TUBE  12/23/2017  . IR CHOLANGIOGRAM EXISTING TUBE  01/02/2018  . IR EXCHANGE BILIARY DRAIN  11/07/2017  . IR EXCHANGE BILIARY DRAIN  12/09/2017  . IR EXCHANGE BILIARY DRAIN  02/03/2018  . IR EXCHANGE BILIARY  DRAIN  02/03/2018  . IR IMAGING GUIDED PORT INSERTION  12/09/2017  . IR INT EXT BILIARY DRAIN WITH CHOLANGIOGRAM  10/26/2017  . IR INT EXT BILIARY DRAIN WITH CHOLANGIOGRAM  12/23/2017  . IR PATIENT EVAL TECH 0-60 MINS  12/02/2017  . IR RADIOLOGIST EVAL & MGMT  11/06/2017  . IR RADIOLOGIST EVAL & MGMT  01/05/2018  . [place]  . PERCUTANEOUS PINNING PHALANX FRACTURE OF HAND  ~ 2008   "plates in 2 places"; right  . PLACEMENT OF SETON N/A 09/05/2017   Procedure: PLACEMENT OF SETON;  Surgeon: Leighton Ruff, MD;  Location[facility];  Service: General;  Laterality: N/A;  . RADIOLOGY WITH ANESTHESIA N/A 10/25/2017   Procedure: MRI WITH ANESTHESIA;  Surgeon: Radiologist, Medication, MD;  Location: [facility];  Service: Radiology;  Laterality: N/A;  . THORACIC DISCECTOMY  1990's    I have reviewed the social history and family history with the patient and they are unchanged from previous note.  ALLERGIES:  is allergic to morphine and related.  MEDICATIONS:  Current Outpatient Medications  Medication Sig Dispense Refill  . aspirin EC 81 MG tablet Take 81 mg by mouth daily.    . Ca Carbonate-Mag Hydroxide (ROLAIDS PO) Take 1-2 tablets by mouth 2 (two) times daily as needed (heartburn).    . celecoxib (CELEBREX) 200 MG capsule Take 200 mg by mouth at bedtime.   5  . fexofenadine (ALLEGRA) 180 MG tablet Take 180 mg by mouth at bedtime.    . gabapentin (NEURONTIN) 400 MG capsule Take 400-800 mg by mouth See admin instructions. Take 400 mg by mouth in the morning and 800 mg in the evening    . insulin detemir (LEVEMIR) 100 UNIT/ML injection Inject into the skin daily.    Marland Kitchen lactose free nutrition (BOOST PLUS) LIQD Take 237 mLs by mouth 3 (three) times daily with meals. 30 Can 0  . lidocaine-prilocaine (EMLA) cream Apply to port site 1 hour prior to access 30 g 0  . Multiple Vitamin (MULTIVITAMIN) tablet Take 1 tablet by mouth daily.    . ondansetron (ZOFRAN) 8 MG tablet  Take 1 tablet (8 mg total) by mouth 2 (two) times daily as needed. Start on the third day after chemotherapy. 30 tablet 1  . tamsulosin (FLOMAX) 0.4 MG CAPS capsule TAKE 1 CAPSULE(0.4 MG) BY MOUTH DAILY AFTER SUPPER 90 capsule 0   No current facility-administered medications for this visit.     PHYSICAL EXAMINATION: ECOG PERFORMANCE STATUS: 2 - Symptomatic, <50% confined to bed  Vitals:   02/05/18 1355  BP: 114/77  Pulse: 96  Resp: 18  Temp: 97.7 F (36.5 C)  SpO2: 100%   Filed Weights   02/05/18 1355  Weight: 190 lb (86.2 kg)    GENERAL:alert, no distress and comfortable SKIN: skin color, texture, turgor are normal, no rashes or significant lesions EYES: normal, Conjunctiva are pink and non-injected, sclera clear OROPHARYNX:no exudate, no erythema and lips, buccal mucosa, and tongue normal  NECK: supple, thyroid normal size, non-tender, without nodularity LYMPH:  no palpable lymphadenopathy in the cervical, axillary or inguinal LUNGS: clear to auscultation and percussion with normal breathing effort HEART: regular rate &  rhythm and no murmurs and no lower extremity edema ABDOMEN:abdomen soft, non-tender and normal bowel sounds, (+) draining tube  Musculoskeletal:no cyanosis of digits and no clubbing NEURO: alert & oriented x 3 with fluent speech, no focal motor/sensory deficits PSYC: (+) emotional about his financial status   LABORATORY DATA:  I have reviewed the data as listed CBC Latest Ref Rng & Units 02/03/2018 01/28/2018 01/22/2018  WBC 4.0 - 10.5 K/uL 19.0(H) 8.5 3.9(L)  Hemoglobin 13.0 - 17.0 g/dL 9.8(L) 9.0(L) 8.5(L)  Hematocrit 39.0 - 52.0 % 30.8(L) 26.8(L) 25.8(L)  Platelets 150 - 400 K/uL 340 117(L) 88(L)     CMP Latest Ref Rng & Units 02/03/2018 01/28/2018 01/22/2018  Glucose 70 - 99 mg/dL 80 147(H) 177(H)  BUN 8 - 23 mg/dL 19 20 19   Creatinine 0.61 - 1.24 mg/dL 0.63 0.69 0.69  Sodium 135 - 145 mmol/L 129(L) 131(L) 128(L)  Potassium 3.5 - 5.1 mmol/L 3.9 3.9  4.6  Chloride 98 - 111 mmol/L 98 98 100  CO2 22 - 32 mmol/L 21(L) 22 22  Calcium 8.9 - 10.3 mg/dL 9.5 9.6 9.2  Total Protein 6.5 - 8.1 g/dL 8.8(H) 7.9 7.6  Total Bilirubin 0.3 - 1.2 mg/dL 1.5(H) 1.4(H) 1.8(H)  Alkaline Phos 38 - 126 U/L 446(H) 411(H) 348(H)  AST 15 - 41 U/L 48(H) 38 38  ALT 0 - 44 U/L 60(H) 66(H) 54(H)      RADIOGRAPHIC STUDIES: I have personally reviewed the radiological images as listed and agreed with the findings in the report. No results found.   ASSESSMENT & PLAN:  Matthew Lewis is a 63 y.o. male with history of   1. Metastaticintrahepatic cholangiocarcinomato right adrenal gland, liver, lungs and nodes -he presented with obstructive jaundice.  Diagnosed in 10/2017. He had 2 biliary drainage placements. His cancer treatment was postponed due to multiple hospitalization for infection, dehydration, it etc.  He is currently on first line Cisplatin and gemcitabine. Tolerating moderate well.  -lab reviewed, adequate for treatment, will proceed cycle 3 day 1 chemo tomorrow  -His liver function has slightly improved, will increase gemcitabine to 820m/m2, continue cisplatin 239mm2  -f/u next week C3D8 -plan to repeat stage CT after this cycle chemo   2.Obstructive jaundice, status post bilateral percutaneous biliary drain -drained tube exchange yesterday, both were capped yesterday. Due to bilary leakage from left side this morning, the left drain tube is connected to a bag now, with high output  -continue monitoring LFTs    3.Liver cirrhosis secondary to alcohol and NASH -Stable  4. Portal vein thrombosis -He is on aspirin, no anticoagulation due to bloody biliary drainage  5. DM, HTN  -f/u with PCP   6.H/o substance abuse and addiction -He's been clean for 6 years now -will becautious about narcoticsandbenzo.  7.Goal of care discussion, DNR/DNI -Wepreviouslydiscussed code status in length.I recommend DNR/DNI,he agrees. -He  understands his cancer is not curable, and the goal of therapy is to prolong his life and improve his quality of life.   Plan  - I will talk to social worker about hospital bills, and Medicaid application - Proceed with chemotherapy tomorrow  - I will schedule CT scan and next cycle of chemotherapy  - f/u next weeks    No problem-specific Assessment & Plan notes found for this encounter.   No orders of the defined types were placed in this encounter.  All questions were answered. The patient knows to call the clinic with any problems, questions or concerns. No barriers to  learning was detected. I spent 20 minutes counseling the patient face to face. The total time spent in the appointment was 25 minutes and more than 50% was on counseling and review of test results  I, Manson Allan am acting as scribe for Dr. [name].  I have reviewed the above documentation for accuracy and completeness, and I agree with the above.     [name], MD 02/05/2018

## 2018-02-05 ENCOUNTER — Inpatient Hospital Stay: Payer: Medicare Other

## 2018-02-05 ENCOUNTER — Encounter: Payer: Self-pay | Admitting: Hematology

## 2018-02-05 ENCOUNTER — Telehealth: Payer: Self-pay | Admitting: Hematology

## 2018-02-05 ENCOUNTER — Inpatient Hospital Stay (HOSPITAL_BASED_OUTPATIENT_CLINIC_OR_DEPARTMENT_OTHER): Payer: Medicare Other | Admitting: Hematology

## 2018-02-05 ENCOUNTER — Telehealth: Payer: Self-pay | Admitting: General Practice

## 2018-02-05 VITALS — BP 114/77 | HR 96 | Temp 97.7°F | Resp 18 | Ht 71.0 in | Wt 190.0 lb

## 2018-02-05 DIAGNOSIS — C7802 Secondary malignant neoplasm of left lung: Secondary | ICD-10-CM

## 2018-02-05 DIAGNOSIS — I10 Essential (primary) hypertension: Secondary | ICD-10-CM

## 2018-02-05 DIAGNOSIS — C7971 Secondary malignant neoplasm of right adrenal gland: Secondary | ICD-10-CM

## 2018-02-05 DIAGNOSIS — E119 Type 2 diabetes mellitus without complications: Secondary | ICD-10-CM

## 2018-02-05 DIAGNOSIS — K7030 Alcoholic cirrhosis of liver without ascites: Secondary | ICD-10-CM | POA: Diagnosis not present

## 2018-02-05 DIAGNOSIS — E1142 Type 2 diabetes mellitus with diabetic polyneuropathy: Secondary | ICD-10-CM | POA: Diagnosis not present

## 2018-02-05 DIAGNOSIS — Z5111 Encounter for antineoplastic chemotherapy: Secondary | ICD-10-CM | POA: Diagnosis not present

## 2018-02-05 DIAGNOSIS — Z79899 Other long term (current) drug therapy: Secondary | ICD-10-CM | POA: Diagnosis not present

## 2018-02-05 DIAGNOSIS — C7801 Secondary malignant neoplasm of right lung: Secondary | ICD-10-CM

## 2018-02-05 DIAGNOSIS — Z794 Long term (current) use of insulin: Secondary | ICD-10-CM

## 2018-02-05 DIAGNOSIS — C221 Intrahepatic bile duct carcinoma: Secondary | ICD-10-CM

## 2018-02-05 DIAGNOSIS — E46 Unspecified protein-calorie malnutrition: Secondary | ICD-10-CM | POA: Diagnosis not present

## 2018-02-05 DIAGNOSIS — I81 Portal vein thrombosis: Secondary | ICD-10-CM | POA: Diagnosis not present

## 2018-02-05 DIAGNOSIS — I700 Atherosclerosis of aorta: Secondary | ICD-10-CM | POA: Diagnosis not present

## 2018-02-05 NOTE — Progress Notes (Signed)
Patient went to see Lenise regarding financial assistance. She explained the $62 [place] as I did before and advised to call the customer service number if interested in setting up payment plan.  I called patient to discuss again the one-time $700 [place] and qualifications as far as proof of income being needed to apply. He asked if more than $700 could be given. Advised patient this is an in-house grant to assist with personal expenses while going through treatment and to help free up funds that may allow him to pay hospital bills. He states he would get me the needed documents at some point but his spouse had been ill. He thanked me and hung up.

## 2018-02-05 NOTE — Telephone Encounter (Signed)
[place] CSW Progress Notes  Request from MD to help patient determine if he is Medicaid eligible.  Reports stress from multiple calls re unpaid medical bills.  Spoke w patient, he is disabled and receives SSDI and [employer].  Reports that his co pays and deductibles are substantial.  Will message financial advocate to determine if any copay assistance or similar is available.  Patient advised that DSS makes decisions on Medicaid eligibility, patient aware of how to access.  Will also send patient information on insurance counseling and Triage Cancer resources, both of which can assist w making decisions on how to handle financial toxicity from cancer diagnosis and treatment.  Edwyna Shell, LCSW Clinical Social Worker Phone:  [phone]

## 2018-02-05 NOTE — Telephone Encounter (Signed)
Called patient per 1/29 sch message - pt is aware labs and flush cancelled .

## 2018-02-06 ENCOUNTER — Inpatient Hospital Stay: Payer: Medicare Other | Admitting: Nutrition

## 2018-02-06 ENCOUNTER — Telehealth: Payer: Self-pay | Admitting: Hematology

## 2018-02-06 ENCOUNTER — Inpatient Hospital Stay: Payer: Medicare Other

## 2018-02-06 ENCOUNTER — Inpatient Hospital Stay: Payer: Medicare Other | Admitting: General Practice

## 2018-02-06 VITALS — BP 119/81 | HR 97 | Temp 98.1°F | Resp 18

## 2018-02-06 DIAGNOSIS — I81 Portal vein thrombosis: Secondary | ICD-10-CM | POA: Diagnosis not present

## 2018-02-06 DIAGNOSIS — Z79899 Other long term (current) drug therapy: Secondary | ICD-10-CM | POA: Diagnosis not present

## 2018-02-06 DIAGNOSIS — I700 Atherosclerosis of aorta: Secondary | ICD-10-CM | POA: Diagnosis not present

## 2018-02-06 DIAGNOSIS — Z7189 Other specified counseling: Secondary | ICD-10-CM

## 2018-02-06 DIAGNOSIS — C7971 Secondary malignant neoplasm of right adrenal gland: Secondary | ICD-10-CM | POA: Diagnosis not present

## 2018-02-06 DIAGNOSIS — C7802 Secondary malignant neoplasm of left lung: Secondary | ICD-10-CM | POA: Diagnosis not present

## 2018-02-06 DIAGNOSIS — Z5111 Encounter for antineoplastic chemotherapy: Secondary | ICD-10-CM | POA: Diagnosis not present

## 2018-02-06 DIAGNOSIS — C221 Intrahepatic bile duct carcinoma: Secondary | ICD-10-CM | POA: Diagnosis not present

## 2018-02-06 DIAGNOSIS — E1142 Type 2 diabetes mellitus with diabetic polyneuropathy: Secondary | ICD-10-CM | POA: Diagnosis not present

## 2018-02-06 DIAGNOSIS — I10 Essential (primary) hypertension: Secondary | ICD-10-CM | POA: Diagnosis not present

## 2018-02-06 DIAGNOSIS — E46 Unspecified protein-calorie malnutrition: Secondary | ICD-10-CM | POA: Diagnosis not present

## 2018-02-06 DIAGNOSIS — C7801 Secondary malignant neoplasm of right lung: Secondary | ICD-10-CM | POA: Diagnosis not present

## 2018-02-06 MED ORDER — SODIUM CHLORIDE 0.9 % IV SOLN
25.0000 mg/m2 | Freq: Once | INTRAVENOUS | Status: AC
  Administered 2018-02-06: 52 mg via INTRAVENOUS
  Filled 2018-02-06: qty 52

## 2018-02-06 MED ORDER — MAGNESIUM SULFATE 50 % IJ SOLN
Freq: Once | INTRAVENOUS | Status: AC
  Administered 2018-02-06: 09:00:00 via INTRAVENOUS
  Filled 2018-02-06: qty 2.96

## 2018-02-06 MED ORDER — SODIUM CHLORIDE 0.9 % IV SOLN
800.0000 mg/m2 | Freq: Once | INTRAVENOUS | Status: AC
  Administered 2018-02-06: 1672 mg via INTRAVENOUS
  Filled 2018-02-06: qty 43.97

## 2018-02-06 MED ORDER — HEPARIN SOD (PORK) LOCK FLUSH 100 UNIT/ML IV SOLN
500.0000 [IU] | Freq: Once | INTRAVENOUS | Status: AC | PRN
  Administered 2018-02-06: 500 [IU]
  Filled 2018-02-06: qty 5

## 2018-02-06 MED ORDER — PALONOSETRON HCL INJECTION 0.25 MG/5ML
0.2500 mg | Freq: Once | INTRAVENOUS | Status: AC
  Administered 2018-02-06: 0.25 mg via INTRAVENOUS

## 2018-02-06 MED ORDER — SODIUM CHLORIDE 0.9% FLUSH
10.0000 mL | INTRAVENOUS | Status: DC | PRN
  Administered 2018-02-06: 10 mL
  Filled 2018-02-06: qty 10

## 2018-02-06 MED ORDER — PALONOSETRON HCL INJECTION 0.25 MG/5ML
INTRAVENOUS | Status: AC
  Filled 2018-02-06: qty 5

## 2018-02-06 MED ORDER — SODIUM CHLORIDE 0.9 % IV SOLN
Freq: Once | INTRAVENOUS | Status: AC
  Administered 2018-02-06: 11:00:00 via INTRAVENOUS
  Filled 2018-02-06: qty 5

## 2018-02-06 MED ORDER — SODIUM CHLORIDE 0.9 % IV SOLN
Freq: Once | INTRAVENOUS | Status: AC
  Administered 2018-02-06: 08:00:00 via INTRAVENOUS
  Filled 2018-02-06: qty 250

## 2018-02-06 NOTE — Progress Notes (Signed)
Nutrition follow-up completed with patient receiving chemotherapy for metastatic right adrenal gland cancer. Weight decreased and documented as 190 pounds January 30. Patient has been consuming 3 Ensure max daily. Noted labs: Sodium 129, potassium 3.9, glucose 88, and LFTs elevated. Noted high output from biliary drain. Patient reports that he eats well and is eating a lot of chicken.  He loves to cook.  Nutrition diagnosis: Unintended weight loss continues.  Intervention: Encourage patient to add 1 high-calorie oral nutrition supplements such as Ensure Enlive in place of 1 or 2 Ensure max. Encouraged weight maintenance.  Reviewed ways to add calories to current foods tolerated. Coupons were provided. Questions were answered.  Teach back method used.  Contact information has been provided.  Next visit: To be scheduled as needed.  **Disclaimer: This note was dictated with voice recognition software. Similar sounding words can inadvertently be transcribed and this note may contain transcription errors which may not have been corrected upon publication of note.**

## 2018-02-06 NOTE — Patient Instructions (Signed)
[facility] Discharge Instructions for Patients Receiving Chemotherapy  Today you received the following chemotherapy agents Gemzar and Cisplatin  To help prevent nausea and vomiting after your treatment, we encourage you to take your nausea medication as directed   If you develop nausea and vomiting that is not controlled by your nausea medication, call the clinic.   BELOW ARE SYMPTOMS THAT SHOULD BE REPORTED IMMEDIATELY:  *FEVER GREATER THAN 100.5 F  *CHILLS WITH OR WITHOUT FEVER  NAUSEA AND VOMITING THAT IS NOT CONTROLLED WITH YOUR NAUSEA MEDICATION  *UNUSUAL SHORTNESS OF BREATH  *UNUSUAL BRUISING OR BLEEDING  TENDERNESS IN MOUTH AND THROAT WITH OR WITHOUT PRESENCE OF ULCERS  *URINARY PROBLEMS  *BOWEL PROBLEMS  UNUSUAL RASH Items with * indicate a potential emergency and should be followed up as soon as possible.  Feel free to call the clinic should you have any questions or concerns. The clinic phone number is [phone].  Please show the CHEMO ALERT CARD at check-in to the Emergency Department and triage nurse.   

## 2018-02-06 NOTE — Telephone Encounter (Signed)
Scheduled appt per 01/30 los.  CT scan was already scheduled  Patient stated he will get someone infusion to print out his calendar.

## 2018-02-06 NOTE — Progress Notes (Signed)
[place] CSW Progress Notes  Check in visit w patient in infusion, follow up to phone call yesterday re financial concerns.  Pt has spoken w billing, addressed his issues, has plan.  Encouraged to Aeronautical engineer to be assessed for [employer].  Will schedule visit w patient at next [facility] visit to complete [facility] application - this can assist w limited food and gas expenses.    Edwyna Shell, LCSW Clinical Social Worker Phone:  [phone]

## 2018-02-10 NOTE — Progress Notes (Signed)
[place]   Telephone:(336) [phone] [phone]   Clinic Follow up Note   Patient Care Team: [name], MD as PCP - General (Internal Medicine) 02/12/2018  CHIEF COMPLAINT: F/u on metastatic cholangiocarcinoma   SUMMARY OF ONCOLOGIC HISTORY: Oncology History   Cancer Staging Intrahepatic cholangiocarcinoma ([place]) Staging form: Intrahepatic Bile Duct, AJCC 8th Edition - Clinical stage from 10/29/2017: Stage IV (cT2, cN1, pM1) - Signed by [name], MD on 11/06/2017       Malignant neoplasm metastatic to adrenal gland [facility])    Initial Diagnosis    Malignant neoplasm metastatic to adrenal gland ([place])    10/23/2017 Imaging    ABD US IMPRESSION: Cirrhotic appearance of the liver.  2.6 cm questionable ill-defined mass versus area of focal parenchymal heterogeneity in segment 6 of the liver. If further imaging evaluation is desired, liver protocol abdominal MRI should be considered.  Splenomegaly.  Apparent thickening of the gallbladder wall, without evidence of cholelithiasis, and with negative sonographic Murphy's sign. The findings are favored to represent gallbladder wall edema secondary to systemic causes such as hyperproteinemia rather than acute cholecystitis. Please correlate clinically.     10/25/2017 Imaging    MRI IMPRESSION: 1. Advanced changes of cirrhosis. Interval development of multifocal enhancing liver lesions, which is concerning for multicentric hepatocellular carcinoma involving both lobes of liver. 2. Lesion within segment 4 B obstructs the right medial hepatic duct and left main duct resulting in moderate to marked biliary ductal dilatation within the left lobe and dome of liver. There may be partial obstruction of the right lateral duct with mild dilatation. 3. Tumor thrombus within the left branch of the portal vein likely secondary to segment 4 B tumor. 4. Bilateral adrenal metastasis and retroperitoneal  nodal metastasis. 5. Stigmata of portal venous hypertension including varices, ascites and splenomegaly. 6. These results will be called to the ordering clinician or representative by the Radiologist Assistant, and communication documented in the PACS or zVision Dashboard.    10/26/2017 Procedure    IMPRESSION: Status post formal PTC and internal/external biliary drainage.    10/27/2017 Imaging    IMPRESSION: Changes of cirrhosis. Multiple hepatic lesions are noted concerning for multifocal hepatoma or metastases. Biliary drain is in place with the tip in the distal common bile duct. There is mild intrahepatic biliary ductal dilatation. Bilateral adrenal nodules and masses, right larger than left compatible with metastases. Retroperitoneal adenopathy. Large stool burden with mild gaseous distention of the colon. Aortic atherosclerosis.     10/29/2017 Pathology Results    Diagnosis Soft Tissue Needle Core Biopsy, right adrenal mass - METASTATIC ADENOCARCINOMA, SEE COMMENT. Microscopic Comment There are malignant glands with central necrosis. Immunohistochemistry is positive for cytokeratin 20 and CDX-2. Cytokeratin 7, TTF-1 and NapsinA are negative. The morphology and immunoprofile favor a colorectal primary over pancreaticobiliary. [name] has reviewed the case. [name] was notified on 10/31/2017.    11/04/2017 Procedure    Colonoscopy impression - Seton in place found on digital rectal exam. - Congested mucosa in the rectum, in the sigmoid colon and in the descending colon. - A few diminutive polyps in the rectum and in the sigmoid colon. No colon or rectal mass identified. - No specimens collected.     Intrahepatic cholangiocarcinoma ([place])   10/27/2017 Imaging    10/27/2017 CT AP IMPRESSION: Changes of cirrhosis. Multiple hepatic lesions are noted concerning for multifocal hepatoma or metastases.  Biliary drain is in place with the tip in the distal common  bile  duct. There is mild intrahepatic biliary ductal dilatation.  Bilateral adrenal nodules and masses, right larger than left compatible with metastases.  Retroperitoneal adenopathy.  Large stool burden with mild gaseous distention of the colon.  Aortic atherosclerosis.    10/29/2017 Cancer Staging    Staging form: Intrahepatic Bile Duct, AJCC 8th Edition - Clinical stage from 10/29/2017: Stage IV (cT2, cN1, pM1) - Signed by [name], MD on 11/06/2017    10/29/2017 Pathology Results    10/29/2017 Surgical Pathology Diagnosis Soft Tissue Needle Core Biopsy, right adrenal mass - METASTATIC ADENOCARCINOMA, SEE COMMENT.    11/06/2017 Initial Diagnosis    Intrahepatic cholangiocarcinoma ([place])    11/06/2017 Imaging    11/06/2017 CT AP IMPRESSION: 1. Increase in RIGHT pleural effusion. Stable masslike consolidation at the RIGHT lung base representing neoplasm versus pneumonia. 2. Stable metastatic nodule at the RIGHT lung base. 3. Infiltrative mass in the central LEFT hepatic lobe and discrete lesions in the RIGHT hepatic lobe consistent with metastatic disease versus primary hepatic biliary carcinoma. Favor cholangiocarcinoma. 4. Percutaneous drainage catheter extends the porta hepatis. Small trapped pneumothorax along the tract of the catheter at the inferolateral RIGHT lung base. 5. Stable adrenal metastasis. 6. Metastatic adenopathy in the retroperitoneum unchanged. 7. Morphologic changes in liver consistent with  cirrhosis.    11/08/2017 Imaging    11/08/2017 CT AP IMPRESSION: 1. Overall no significant interval change since the CT of 11/06/2017. Stable positioning of the percutaneous biliary drain with similar degree of biliary ductal dilatation. 2. Cirrhosis with multiple hepatic metastatic disease. Infiltrative appearing lesion in the left lobe of the liver appears to extend into the left portal vein and may represent a tumor thrombus, or bland thrombus. A  primary biliary neoplasm (cholangiocarcinoma, or metastatic disease are other considerations. 3. Retroperitoneal, mesenteric, and adrenal metastatic disease. 4. Hepatic colopathy versus less likely segmental colitis of the hepatic flexure. 5. Small right pleural effusion and bilateral lung base pulmonary masses/nodules as seen previously. 6. Tiny pockets of pneumoperitoneum lateral to the liver along the percutaneous drain, decreased since the prior CT. 7. Other findings as above.    11/20/2017 - 11/24/2017 Hospital Admission    Admit date: 11/08/2017 Admission diagnosis: fatigue Additional comments: discharged 11/24/2017    12/18/2017 -  Chemotherapy    first line chemo gemcitabine/cisplatin on days 1 and 15 every 28 days starting 12/18/17    12/22/2017 - 12/30/2017 Hospital Admission    Admit date: 12/22/2017 Admission diagnosis: Dysuria    12/22/2017 Imaging    12/22/2017 CT AP IMPRESSION: 1. Prominently distended and otherwise normal urinary bladder with mild prostatomegaly. Findings suggest acute bladder outlet obstruction. No hydronephrosis. 2. Large colorectal stool volume compatible with constipation. New mild circumferential rectal wall thickening and perirectal fat haziness suggesting stercoral colitis. No free air. No abscess. 3. Stable infiltrative cholangiocarcinoma in the central left liver lobe with associated peripheral intrahepatic biliary ductal dilatation throughout the left liver lobe. 4. Stable well-positioned internal external drain traversing the right liver lobe and decompressing the right liver biliary system. 5. Stable liver, bilateral adrenal, retroperitoneal, left mesenteric and right infrahilar nodal and left lung base metastases. No progressive metastatic disease since 12/03/2017 CT. 6. Cirrhosis. Trace perihepatic ascites, decreased. Stable moderate splenomegaly. 7.  Aortic Atherosclerosis (ICD10-I70.0).     CURRENT THERAPY  first line  chemo gemcitabine/cisplatin on days 1 and8every 21days started12/12/19  INTERVAL HISTORY: Matthew Lewis is a 63 y.o. male who is here for follow-up. Since his last visit pt went to see [facility]  regarding financial assistance and will complete [employer] application. Today, he is here alone. He is doing well today. Last week he was fatigue but he was still able to move around. The drain tube is going well and is considering capping it. Denies fever, chills or stomach pain. He has a normal appetite and from last week he has gained 6 lbs.    Pertinent positives and negatives of review of systems are listed and detailed within the above HPI.  REVIEW OF SYSTEMS:  Constitutional: Denies fevers, chills or abnormal weight loss Eyes: Denies blurriness of vision Ears, nose, mouth, throat, and face: Denies mucositis or sore throat Respiratory: Denies cough, dyspnea or wheezes Cardiovascular: Denies palpitation, chest discomfort or lower extremity swelling Gastrointestinal:  Denies nausea, heartburn or change in bowel habits Skin: Denies abnormal skin rashes Lymphatics: Denies new lymphadenopathy or easy bruising Neurological:Denies numbness, tingling or new weaknesses Behavioral/Psych: Mood is stable, no new changes  All other systems were reviewed with the patient and are negative.  MEDICAL HISTORY:  Past Medical History:  Diagnosis Date  . Abscess of anal and rectal regions    horse shoe abscess  . Alcoholic cirrhosis ([place])   . Arthritis    "everywhere; especially in my spine"  . Cirrhosis ([place])    STAGE 1 CIRRHOSIS PATIENT SEES DR NAMDIGAM FOR  . Colon cancer ([place]) 05/2005   TX SURGERY WITH LYMPH NODE REMOVAL  . Cough    LAST FEW WEEKS SAW DR RAMOS LUNGS MILKY SPUTUM OCC  . DM type 2 with diabetic peripheral neuropathy (H[facility])    left foot  . Fatigue   . Fistula, anal    multiple  . History of substance abuse ([place])    last alcohol was 08/2011; last marijuania was 08/2011  .  Hypertension   . PTSD (post-traumatic stress disorder)    "severe" HX OF  . Sleep deprivation     SURGICAL HISTORY: Past Surgical History:  Procedure Laterality Date  . ABSCESS DRAINAGE     "probably 15-20 so far at least; rectal"  . ANAL FISTULECTOMY  12/13/1998  . ANTERIOR FUSION CERVICAL SPINE  2010   "triple"; "did 2 surgeries on the same day in [id number] due to complications"  . CORONARY ANGIOPLASTY  2001  . detached muscle  2010   right chest; "after cervical fusion complications"  . ELBOW SURGERY  2007   "cut out part of a muscle"; left  . EXAMINATION UNDER ANESTHESIA  03/22/2005   fistula  . EXAMINATION UNDER ANESTHESIA  06/06/2011   Procedure: EXAM UNDER ANESTHESIA;  Surgeon: Stark Klein, MD;  Location: [place];  Service: General;  Laterality: N/A;  . HEMICOLECTOMY  06/06/2005   left  . INCISION AND DRAINAGE PERIRECTAL ABSCESS  03/30/2005  . INCISION AND DRAINAGE PERIRECTAL ABSCESS  12/13/2004  . INCISION AND DRAINAGE PERIRECTAL ABSCESS  07/14/2001  . INCISION AND DRAINAGE PERIRECTAL ABSCESS  08/12/2010   horseshoe abscess; Dr Redmond Pulling  . INCISION AND DRAINAGE PERIRECTAL ABSCESS  06/06/2011   Procedure: IRRIGATION AND DEBRIDEMENT PERIRECTAL ABSCESS;  Surgeon: Stark Klein, MD;  Location: [place];  Service: General;  Laterality: N/A;  . INCISION AND DRAINAGE PERIRECTAL ABSCESS N/A 02/24/2017   Procedure: IRRIGATION AND DEBRIDEMENT PERIRECTAL ABSCESS;  Surgeon: Ralene Ok, MD;  Location: [place];  Service: General;  Laterality: N/A;  . IR CHOLANGIOGRAM EXISTING TUBE  12/23/2017  . IR CHOLANGIOGRAM EXISTING TUBE  01/02/2018  . IR EXCHANGE BILIARY DRAIN  11/07/2017  . IR EXCHANGE BILIARY DRAIN  12/09/2017  . IR EXCHANGE BILIARY DRAIN  02/03/2018  . IR EXCHANGE BILIARY DRAIN  02/03/2018  . IR IMAGING GUIDED PORT INSERTION  12/09/2017  . IR INT EXT BILIARY DRAIN WITH CHOLANGIOGRAM  10/26/2017  . IR INT EXT BILIARY DRAIN WITH CHOLANGIOGRAM  12/23/2017  . IR PATIENT EVAL TECH 0-60 MINS   12/02/2017  . IR RADIOLOGIST EVAL & MGMT  11/06/2017  . IR RADIOLOGIST EVAL & MGMT  01/05/2018  . [place]  . PERCUTANEOUS PINNING PHALANX FRACTURE OF HAND  ~ 2008   "plates in 2 places"; right  . PLACEMENT OF SETON N/A 09/05/2017   Procedure: PLACEMENT OF SETON;  Surgeon: Leighton Ruff, MD;  Location: [facility];  Service: General;  Laterality: N/A;  . RADIOLOGY WITH ANESTHESIA N/A 10/25/2017   Procedure: MRI WITH ANESTHESIA;  Surgeon: Radiologist, Medication, MD;  Location: [place];  Service: Radiology;  Laterality: N/A;  . THORACIC DISCECTOMY  1990's    I have reviewed the social history and family history with the patient and they are unchanged from previous note.  ALLERGIES:  is allergic to morphine and related.  MEDICATIONS:  Current Outpatient Medications  Medication Sig Dispense Refill  . aspirin EC 81 MG tablet Take 81 mg by mouth daily.    . Ca Carbonate-Mag Hydroxide (ROLAIDS PO) Take 1-2 tablets by mouth 2 (two) times daily as needed (heartburn).    . celecoxib (CELEBREX) 200 MG capsule Take 200 mg by mouth at bedtime.   5  . fexofenadine (ALLEGRA) 180 MG tablet Take 180 mg by mouth at bedtime.    . gabapentin (NEURONTIN) 400 MG capsule Take 400-800 mg by mouth See admin instructions. Take 400 mg by mouth in the morning and 800 mg in the evening    . insulin detemir (LEVEMIR) 100 UNIT/ML injection Inject into the skin daily.    Marland Kitchen lactose free nutrition (BOOST PLUS) LIQD Take 237 mLs by mouth 3 (three) times daily with meals. 30 Can 0  . lidocaine-prilocaine (EMLA) cream Apply to port site 1 hour prior to access 30 g 0  . Multiple Vitamin (MULTIVITAMIN) tablet Take 1 tablet by mouth daily.    . ondansetron (ZOFRAN) 8 MG tablet Take 1 tablet (8 mg total) by mouth 2 (two) times daily as needed. Start on the third day after chemotherapy. 30 tablet 1  . tamsulosin (FLOMAX) 0.4 MG CAPS capsule TAKE 1 CAPSULE(0.4 MG) BY MOUTH DAILY AFTER SUPPER 90  capsule 0   No current facility-administered medications for this visit.     PHYSICAL EXAMINATION: ECOG PERFORMANCE STATUS: 1 - Symptomatic but completely ambulatory  Vitals:   02/12/18 1315  BP: 133/76  Pulse: (!) 105  Resp: 20  Temp: 97.9 F (36.6 C)  SpO2: 100%   Filed Weights   02/12/18 1315  Weight: 196 lb 3.2 oz (89 kg)    GENERAL:alert, no distress and comfortable SKIN: skin color, texture, turgor are normal, no rashes or significant lesions EYES: normal, Conjunctiva are pink and non-injected, sclera clear OROPHARYNX:no exudate, no erythema and lips, buccal mucosa, and tongue normal  NECK: supple, thyroid normal size, non-tender, without nodularity LYMPH:  no palpable lymphadenopathy in the cervical, axillary or inguinal LUNGS: clear to auscultation and percussion with normal breathing effort HEART: rhythm and no murmurs and no lower extremity edema, (+) mild tachycardia ABDOMEN:abdomen soft, non-tender and normal bowel sounds Musculoskeletal:no cyanosis of digits and no clubbing  NEURO: alert & oriented x 3 with fluent speech,  no focal motor/sensory deficits  LABORATORY DATA:  I have reviewed the data as listed CBC Latest Ref Rng & Units 02/12/2018 02/03/2018 01/28/2018  WBC 4.0 - 10.5 K/uL 4.7 19.0(H) 8.5  Hemoglobin 13.0 - 17.0 g/dL 8.3(L) 9.8(L) 9.0(L)  Hematocrit 39.0 - 52.0 % 25.7(L) 30.8(L) 26.8(L)  Platelets 150 - 400 K/uL 233 340 117(L)     CMP Latest Ref Rng & Units 02/12/2018 02/03/2018 01/28/2018  Glucose 70 - 99 mg/dL 159(H) 80 147(H)  BUN 8 - 23 mg/dL 16 19 20   Creatinine 0.61 - 1.24 mg/dL 0.67 0.63 0.69  Sodium 135 - 145 mmol/L 134(L) 129(L) 131(L)  Potassium 3.5 - 5.1 mmol/L 4.1 3.9 3.9  Chloride 98 - 111 mmol/L 102 98 98  CO2 22 - 32 mmol/L 26 21(L) 22  Calcium 8.9 - 10.3 mg/dL 9.3 9.5 9.6  Total Protein 6.5 - 8.1 g/dL 7.4 8.8(H) 7.9  Total Bilirubin 0.3 - 1.2 mg/dL 0.9 1.5(H) 1.4(H)  Alkaline Phos 38 - 126 U/L 398(H) 446(H) 411(H)  AST 15 - 41  U/L 41 48(H) 38  ALT 0 - 44 U/L 46(H) 60(H) 66(H)      RADIOGRAPHIC STUDIES: I have personally reviewed the radiological images as listed and agreed with the findings in the report. No results found.   ASSESSMENT & PLAN:  Matthew Lewis is a 63 y.o. male with history of  1. Metastaticintrahepatic cholangiocarcinomato right adrenal gland, liver, lungs and nodes -he presented with obstructive jaundice.  Diagnosed in 10/2017. He had 2 biliary drainage placements. His cancer treatment was postponed due to multiple hospitalization for infection, dehydration, it etc. He is currently onfirst lineCisplatin and gemcitabine. Toleratingmoderatewell.  -He tolerated chemotherapy well last week, with mild fatigue, has recovered well.  Lab reviewed, slightly worsening anemia, LFT improved, back to normal.  Adequate for treatment, he will receive cycle 2-day 8 chemotherapy tomorrow. -restaging scan next week -will see him back in 2 weeks   2.Obstructive jaundice, status post bilateral percutaneous biliary drain -drained tube exchange 1/28, both were capped yesterday. Due to bilary leakage from left side this morning, the left drain tube is connected to a bag now, it is draining well, and his output has decreased.  Patient may cap it -f/u with IR  -continue monitoring LFTs    3.Liver cirrhosis secondary to alcohol and NASH -Stable  4. Portal vein thrombosis -He is on aspirin, no anticoagulation due to bloody biliary drainage  5. DM, HTN -f/u with PCP   6.H/o substance abuse and addiction -He's been clean for 6 years now -will becautious about narcoticsandbenzo.  7. Anemia  -Secondary to chemotherapy and underlying malignancy -Slightly worse today, secondary to chemotherapy, he is not very symptomatic, will continue monitoring.  8.Goal of care discussion, DNR/DNI -Wepreviouslydiscussed code status in length.I recommend DNR/DNI,he agrees. -He understands his  cancer is not curable, and the goal of therapy is to prolong his life and improve his quality of life.   Plan  - I reviewed labs today and he is adequate continue chemo treatment tomorrow, with udenyca on day 9  - He has scan 02/19/18 and I will call him with his results  - F/u in 2 weeks before next cycle chemo  No problem-specific Assessment & Plan notes found for this encounter.   No orders of the defined types were placed in this encounter.  All questions were answered. The patient knows to call the clinic with any problems, questions or concerns. No barriers to learning was detected. I  spent 20 minutes counseling the patient face to face. The total time spent in the appointment was 25 minutes and more than 50% was on counseling and review of test results  I, Manson Allan am acting as scribe for Dr. [name].  I have reviewed the above documentation for accuracy and completeness, and I agree with the above.     [name], MD 02/12/2018

## 2018-02-12 ENCOUNTER — Inpatient Hospital Stay (HOSPITAL_BASED_OUTPATIENT_CLINIC_OR_DEPARTMENT_OTHER): Payer: Medicare Other | Admitting: Hematology

## 2018-02-12 ENCOUNTER — Encounter: Payer: Self-pay | Admitting: Hematology

## 2018-02-12 ENCOUNTER — Inpatient Hospital Stay: Payer: Medicare Other

## 2018-02-12 ENCOUNTER — Inpatient Hospital Stay: Payer: Medicare Other | Attending: Hematology

## 2018-02-12 VITALS — BP 133/76 | HR 105 | Temp 97.9°F | Resp 20 | Ht 71.0 in | Wt 196.2 lb

## 2018-02-12 DIAGNOSIS — C221 Intrahepatic bile duct carcinoma: Secondary | ICD-10-CM | POA: Diagnosis not present

## 2018-02-12 DIAGNOSIS — C7801 Secondary malignant neoplasm of right lung: Secondary | ICD-10-CM

## 2018-02-12 DIAGNOSIS — I10 Essential (primary) hypertension: Secondary | ICD-10-CM | POA: Diagnosis not present

## 2018-02-12 DIAGNOSIS — D6481 Anemia due to antineoplastic chemotherapy: Secondary | ICD-10-CM | POA: Diagnosis not present

## 2018-02-12 DIAGNOSIS — C7802 Secondary malignant neoplasm of left lung: Secondary | ICD-10-CM | POA: Diagnosis not present

## 2018-02-12 DIAGNOSIS — D630 Anemia in neoplastic disease: Secondary | ICD-10-CM

## 2018-02-12 DIAGNOSIS — Z5189 Encounter for other specified aftercare: Secondary | ICD-10-CM | POA: Insufficient documentation

## 2018-02-12 DIAGNOSIS — Z79899 Other long term (current) drug therapy: Secondary | ICD-10-CM | POA: Diagnosis not present

## 2018-02-12 DIAGNOSIS — E119 Type 2 diabetes mellitus without complications: Secondary | ICD-10-CM

## 2018-02-12 DIAGNOSIS — Z794 Long term (current) use of insulin: Secondary | ICD-10-CM

## 2018-02-12 DIAGNOSIS — I81 Portal vein thrombosis: Secondary | ICD-10-CM

## 2018-02-12 DIAGNOSIS — E1142 Type 2 diabetes mellitus with diabetic polyneuropathy: Secondary | ICD-10-CM | POA: Diagnosis not present

## 2018-02-12 DIAGNOSIS — Z5111 Encounter for antineoplastic chemotherapy: Secondary | ICD-10-CM | POA: Insufficient documentation

## 2018-02-12 DIAGNOSIS — K7030 Alcoholic cirrhosis of liver without ascites: Secondary | ICD-10-CM | POA: Diagnosis not present

## 2018-02-12 DIAGNOSIS — C7971 Secondary malignant neoplasm of right adrenal gland: Secondary | ICD-10-CM | POA: Diagnosis not present

## 2018-02-12 DIAGNOSIS — Z95828 Presence of other vascular implants and grafts: Secondary | ICD-10-CM

## 2018-02-12 LAB — CBC WITH DIFFERENTIAL (CANCER CENTER ONLY)
ABS IMMATURE GRANULOCYTES: 0.01 10*3/uL (ref 0.00–0.07)
Basophils Absolute: 0 10*3/uL (ref 0.0–0.1)
Basophils Relative: 0 %
Eosinophils Absolute: 0 10*3/uL (ref 0.0–0.5)
Eosinophils Relative: 1 %
HCT: 25.7 % — ABNORMAL LOW (ref 39.0–52.0)
Hemoglobin: 8.3 g/dL — ABNORMAL LOW (ref 13.0–17.0)
Immature Granulocytes: 0 %
Lymphocytes Relative: 18 %
Lymphs Abs: 0.8 10*3/uL (ref 0.7–4.0)
MCH: 29.4 pg (ref 26.0–34.0)
MCHC: 32.3 g/dL (ref 30.0–36.0)
MCV: 91.1 fL (ref 80.0–100.0)
Monocytes Absolute: 0.5 10*3/uL (ref 0.1–1.0)
Monocytes Relative: 10 %
Neutro Abs: 3.3 10*3/uL (ref 1.7–7.7)
Neutrophils Relative %: 71 %
Platelet Count: 233 10*3/uL (ref 150–400)
RBC: 2.82 MIL/uL — AB (ref 4.22–5.81)
RDW: 16.2 % — ABNORMAL HIGH (ref 11.5–15.5)
WBC Count: 4.7 10*3/uL (ref 4.0–10.5)
nRBC: 0 % (ref 0.0–0.2)

## 2018-02-12 LAB — CMP (CANCER CENTER ONLY)
ALT: 46 U/L — ABNORMAL HIGH (ref 0–44)
AST: 41 U/L (ref 15–41)
Albumin: 2.8 g/dL — ABNORMAL LOW (ref 3.5–5.0)
Alkaline Phosphatase: 398 U/L — ABNORMAL HIGH (ref 38–126)
Anion gap: 6 (ref 5–15)
BUN: 16 mg/dL (ref 8–23)
CO2: 26 mmol/L (ref 22–32)
Calcium: 9.3 mg/dL (ref 8.9–10.3)
Chloride: 102 mmol/L (ref 98–111)
Creatinine: 0.67 mg/dL (ref 0.61–1.24)
GFR, Est AFR Am: >60 mL/min (ref >60)
GFR, Estimated: >60 mL/min (ref >60)
Glucose, Bld: 159 mg/dL — ABNORMAL HIGH (ref 70–99)
POTASSIUM: 4.1 mmol/L (ref 3.5–5.1)
Sodium: 134 mmol/L — ABNORMAL LOW (ref 135–145)
Total Bilirubin: 0.9 mg/dL (ref 0.3–1.2)
Total Protein: 7.4 g/dL (ref 6.5–8.1)

## 2018-02-12 MED ORDER — HEPARIN SOD (PORK) LOCK FLUSH 100 UNIT/ML IV SOLN
500.0000 [IU] | Freq: Once | INTRAVENOUS | Status: AC | PRN
  Administered 2018-02-12: 500 [IU]
  Filled 2018-02-12: qty 5

## 2018-02-12 MED ORDER — SODIUM CHLORIDE 0.9% FLUSH
10.0000 mL | INTRAVENOUS | Status: DC | PRN
  Administered 2018-02-12: 10 mL
  Filled 2018-02-12: qty 10

## 2018-02-12 NOTE — Progress Notes (Signed)
Spoke w/ [name] 2/6. Please start pre-meds w/ fluids on 2/7 (do not need to wait for UOP for premeds) as patient would like to come for G-CSF injection Saturday. Please ensure chemo is finished at least 24 hours prior to injection appt Sat.   Demetrius Charity, PharmD, [place] [facility] Pharmacist Pharmacy Phone: [phone] 02/12/2018

## 2018-02-13 ENCOUNTER — Inpatient Hospital Stay: Payer: Medicare Other

## 2018-02-13 ENCOUNTER — Telehealth: Payer: Self-pay | Admitting: Hematology

## 2018-02-13 ENCOUNTER — Telehealth: Payer: Self-pay

## 2018-02-13 ENCOUNTER — Inpatient Hospital Stay: Payer: Medicare Other | Admitting: General Practice

## 2018-02-13 VITALS — BP 112/75 | HR 103 | Temp 98.1°F | Resp 18 | Ht 71.0 in | Wt 193.8 lb

## 2018-02-13 DIAGNOSIS — I10 Essential (primary) hypertension: Secondary | ICD-10-CM | POA: Diagnosis not present

## 2018-02-13 DIAGNOSIS — Z79899 Other long term (current) drug therapy: Secondary | ICD-10-CM | POA: Diagnosis not present

## 2018-02-13 DIAGNOSIS — Z7189 Other specified counseling: Secondary | ICD-10-CM

## 2018-02-13 DIAGNOSIS — Z5111 Encounter for antineoplastic chemotherapy: Secondary | ICD-10-CM | POA: Diagnosis not present

## 2018-02-13 DIAGNOSIS — C7802 Secondary malignant neoplasm of left lung: Secondary | ICD-10-CM | POA: Diagnosis not present

## 2018-02-13 DIAGNOSIS — C221 Intrahepatic bile duct carcinoma: Secondary | ICD-10-CM

## 2018-02-13 DIAGNOSIS — C7971 Secondary malignant neoplasm of right adrenal gland: Secondary | ICD-10-CM | POA: Diagnosis not present

## 2018-02-13 DIAGNOSIS — Z5189 Encounter for other specified aftercare: Secondary | ICD-10-CM | POA: Diagnosis not present

## 2018-02-13 DIAGNOSIS — E1142 Type 2 diabetes mellitus with diabetic polyneuropathy: Secondary | ICD-10-CM | POA: Diagnosis not present

## 2018-02-13 DIAGNOSIS — C7801 Secondary malignant neoplasm of right lung: Secondary | ICD-10-CM | POA: Diagnosis not present

## 2018-02-13 LAB — CANCER ANTIGEN 19-9: CA 19-9: 32 U/mL (ref 0–35)

## 2018-02-13 MED ORDER — SODIUM CHLORIDE 0.9 % IV SOLN
Freq: Once | INTRAVENOUS | Status: AC
  Administered 2018-02-13: 11:00:00 via INTRAVENOUS
  Filled 2018-02-13: qty 5

## 2018-02-13 MED ORDER — SODIUM CHLORIDE 0.9 % IV SOLN
Freq: Once | INTRAVENOUS | Status: AC
  Administered 2018-02-13: 08:00:00 via INTRAVENOUS
  Filled 2018-02-13: qty 250

## 2018-02-13 MED ORDER — SODIUM CHLORIDE 0.9 % IV SOLN
800.0000 mg/m2 | Freq: Once | INTRAVENOUS | Status: AC
  Administered 2018-02-13: 1672 mg via INTRAVENOUS
  Filled 2018-02-13: qty 43.97

## 2018-02-13 MED ORDER — SODIUM CHLORIDE 0.9 % IV SOLN
25.0000 mg/m2 | Freq: Once | INTRAVENOUS | Status: AC
  Administered 2018-02-13: 52 mg via INTRAVENOUS
  Filled 2018-02-13: qty 52

## 2018-02-13 MED ORDER — SODIUM CHLORIDE 0.9% FLUSH
10.0000 mL | INTRAVENOUS | Status: DC | PRN
  Administered 2018-02-13: 10 mL
  Filled 2018-02-13: qty 10

## 2018-02-13 MED ORDER — PALONOSETRON HCL INJECTION 0.25 MG/5ML
0.2500 mg | Freq: Once | INTRAVENOUS | Status: AC
  Administered 2018-02-13: 0.25 mg via INTRAVENOUS

## 2018-02-13 MED ORDER — HEPARIN SOD (PORK) LOCK FLUSH 100 UNIT/ML IV SOLN
500.0000 [IU] | Freq: Once | INTRAVENOUS | Status: AC | PRN
  Administered 2018-02-13: 500 [IU]
  Filled 2018-02-13: qty 5

## 2018-02-13 MED ORDER — PALONOSETRON HCL INJECTION 0.25 MG/5ML
INTRAVENOUS | Status: AC
  Filled 2018-02-13: qty 5

## 2018-02-13 MED ORDER — POTASSIUM CHLORIDE 2 MEQ/ML IV SOLN
Freq: Once | INTRAVENOUS | Status: AC
  Administered 2018-02-13: 09:00:00 via INTRAVENOUS
  Filled 2018-02-13: qty 10

## 2018-02-13 NOTE — Patient Instructions (Signed)
[facility] Discharge Instructions for Patients Receiving Chemotherapy  Today you received the following chemotherapy agents :  Cisplatin, Gemcitabine.  To help prevent nausea and vomiting after your treatment, we encourage you to take your nausea medication as prescribed.   If you develop nausea and vomiting that is not controlled by your nausea medication, call the clinic.   BELOW ARE SYMPTOMS THAT SHOULD BE REPORTED IMMEDIATELY:  *FEVER GREATER THAN 100.5 F  *CHILLS WITH OR WITHOUT FEVER  NAUSEA AND VOMITING THAT IS NOT CONTROLLED WITH YOUR NAUSEA MEDICATION  *UNUSUAL SHORTNESS OF BREATH  *UNUSUAL BRUISING OR BLEEDING  TENDERNESS IN MOUTH AND THROAT WITH OR WITHOUT PRESENCE OF ULCERS  *URINARY PROBLEMS  *BOWEL PROBLEMS  UNUSUAL RASH Items with * indicate a potential emergency and should be followed up as soon as possible.  Feel free to call the clinic should you have any questions or concerns. The clinic phone number is [phone].  Please show the [place] at check-in to the Emergency Department and triage nurse.

## 2018-02-13 NOTE — Telephone Encounter (Signed)
Left voice message for patient answering hs question about wearing a mask in public.  Per [name] wear a mask in enclosed populated areas such as grocery store, movies, shopping.

## 2018-02-13 NOTE — Progress Notes (Signed)
[facility] CSW Progress Notes  Met w patient in infusion to continue to discuss financial strain related to medical bills and living expenses.  Enrolled in [employer], gave first $50 disbursement (eligible for up to $200 total lifetime).  Has not completed emrollment w Estate manager/land agent for [employer], but aware he needs to submit paperwork - wife sick and will provide when she recovers.  Also provided information on [facility] [employer], organization that assists patients w finding solutions to high medical bills.  Patient will investigate.  Edwyna Shell, LCSW Clinical Social Worker Phone:  [phone]

## 2018-02-13 NOTE — Telephone Encounter (Signed)
No los per 2/6.

## 2018-02-14 ENCOUNTER — Inpatient Hospital Stay: Payer: Medicare Other

## 2018-02-14 VITALS — BP 130/79 | HR 96 | Temp 97.9°F | Resp 18

## 2018-02-14 DIAGNOSIS — Z5111 Encounter for antineoplastic chemotherapy: Secondary | ICD-10-CM | POA: Diagnosis not present

## 2018-02-14 DIAGNOSIS — C7801 Secondary malignant neoplasm of right lung: Secondary | ICD-10-CM | POA: Diagnosis not present

## 2018-02-14 DIAGNOSIS — C7802 Secondary malignant neoplasm of left lung: Secondary | ICD-10-CM | POA: Diagnosis not present

## 2018-02-14 DIAGNOSIS — C221 Intrahepatic bile duct carcinoma: Secondary | ICD-10-CM | POA: Diagnosis not present

## 2018-02-14 DIAGNOSIS — I10 Essential (primary) hypertension: Secondary | ICD-10-CM | POA: Diagnosis not present

## 2018-02-14 DIAGNOSIS — Z7189 Other specified counseling: Secondary | ICD-10-CM

## 2018-02-14 DIAGNOSIS — Z79899 Other long term (current) drug therapy: Secondary | ICD-10-CM | POA: Diagnosis not present

## 2018-02-14 DIAGNOSIS — E1142 Type 2 diabetes mellitus with diabetic polyneuropathy: Secondary | ICD-10-CM | POA: Diagnosis not present

## 2018-02-14 DIAGNOSIS — Z5189 Encounter for other specified aftercare: Secondary | ICD-10-CM | POA: Diagnosis not present

## 2018-02-14 DIAGNOSIS — C7971 Secondary malignant neoplasm of right adrenal gland: Secondary | ICD-10-CM | POA: Diagnosis not present

## 2018-02-14 MED ORDER — PEGFILGRASTIM-CBQV 6 MG/0.6ML ~~LOC~~ SOSY
PREFILLED_SYRINGE | SUBCUTANEOUS | Status: AC
  Filled 2018-02-14: qty 0.6

## 2018-02-14 MED ORDER — PEGFILGRASTIM-CBQV 6 MG/0.6ML ~~LOC~~ SOSY
6.0000 mg | PREFILLED_SYRINGE | Freq: Once | SUBCUTANEOUS | Status: AC
  Administered 2018-02-14: 6 mg via SUBCUTANEOUS

## 2018-02-14 NOTE — Patient Instructions (Signed)
Pegfilgrastim injection  What is this medicine?  PEGFILGRASTIM (PEG fil gra stim) is a long-acting granulocyte colony-stimulating factor that stimulates the growth of neutrophils, a type of white blood cell important in the body's fight against infection. It is used to reduce the incidence of fever and infection in patients with certain types of cancer who are receiving chemotherapy that affects the bone marrow, and to increase survival after being exposed to high doses of radiation.  This medicine may be used for other purposes; ask your health care provider or pharmacist if you have questions.  COMMON BRAND NAME(S): Fulphila, Neulasta, UDENYCA  What should I tell my health care provider before I take this medicine?  They need to know if you have any of these conditions:  -kidney disease  -latex allergy  -ongoing radiation therapy  -sickle cell disease  -skin reactions to acrylic adhesives (On-Body Injector only)  -an unusual or allergic reaction to pegfilgrastim, filgrastim, other medicines, foods, dyes, or preservatives  -pregnant or trying to get pregnant  -breast-feeding  How should I use this medicine?  This medicine is for injection under the skin. If you get this medicine at home, you will be taught how to prepare and give the pre-filled syringe or how to use the On-body Injector. Refer to the patient Instructions for Use for detailed instructions. Use exactly as directed. Tell your healthcare provider immediately if you suspect that the On-body Injector may not have performed as intended or if you suspect the use of the On-body Injector resulted in a missed or partial dose.  It is important that you put your used needles and syringes in a special sharps container. Do not put them in a trash can. If you do not have a sharps container, call your pharmacist or healthcare provider to get one.  Talk to your pediatrician regarding the use of this medicine in children. While this drug may be prescribed for  selected conditions, precautions do apply.  Overdosage: If you think you have taken too much of this medicine contact a poison control center or emergency room at once.  NOTE: This medicine is only for you. Do not share this medicine with others.  What if I miss a dose?  It is important not to miss your dose. Call your doctor or health care professional if you miss your dose. If you miss a dose due to an On-body Injector failure or leakage, a new dose should be administered as soon as possible using a single prefilled syringe for manual use.  What may interact with this medicine?  Interactions have not been studied.  Give your health care provider a list of all the medicines, herbs, non-prescription drugs, or dietary supplements you use. Also tell them if you smoke, drink alcohol, or use illegal drugs. Some items may interact with your medicine.  This list may not describe all possible interactions. Give your health care provider a list of all the medicines, herbs, non-prescription drugs, or dietary supplements you use. Also tell them if you smoke, drink alcohol, or use illegal drugs. Some items may interact with your medicine.  What should I watch for while using this medicine?  You may need blood work done while you are taking this medicine.  If you are going to need a MRI, CT scan, or other procedure, tell your doctor that you are using this medicine (On-Body Injector only).  What side effects may I notice from receiving this medicine?  Side effects that you should report to   your doctor or health care professional as soon as possible:  -allergic reactions like skin rash, itching or hives, swelling of the face, lips, or tongue  -back pain  -dizziness  -fever  -pain, redness, or irritation at site where injected  -pinpoint red spots on the skin  -red or dark-brown urine  -shortness of breath or breathing problems  -stomach or side pain, or pain at the shoulder  -swelling  -tiredness  -trouble passing urine or  change in the amount of urine  Side effects that usually do not require medical attention (report to your doctor or health care professional if they continue or are bothersome):  -bone pain  -muscle pain  This list may not describe all possible side effects. Call your doctor for medical advice about side effects. You may report side effects to FDA at 1-800-FDA-1088.  Where should I keep my medicine?  Keep out of the reach of children.  If you are using this medicine at home, you will be instructed on how to store it. Throw away any unused medicine after the expiration date on the label.  NOTE: This sheet is a summary. It may not cover all possible information. If you have questions about this medicine, talk to your doctor, pharmacist, or health care provider.   2019 Elsevier/Gold Standard (2017-03-31 16:57:08)

## 2018-02-18 ENCOUNTER — Telehealth: Payer: Self-pay | Admitting: Physician Assistant

## 2018-02-18 MED FILL — NORMAL SALINE FLUSH SYRINGE: 0.9 | 30 days supply | Qty: 300 | Fill #2

## 2018-02-18 NOTE — Telephone Encounter (Signed)
Request for refill of NS flushes received from [facility] - faxed back authorization for 10 cc NS flush - flush biliary drain with 5-10 cc QD x 3 days, 5 refills.

## 2018-02-19 ENCOUNTER — Ambulatory Visit (HOSPITAL_COMMUNITY)
Admission: RE | Admit: 2018-02-19 | Discharge: 2018-02-19 | Disposition: A | Discharge status: Home / Self Care | Payer: Medicare Other | Source: Ambulatory Visit | Attending: Student | Admitting: Student

## 2018-02-19 ENCOUNTER — Ambulatory Visit (HOSPITAL_COMMUNITY)
Admission: RE | Admit: 2018-02-19 | Discharge: 2018-02-19 | Disposition: A | Discharge status: Home / Self Care | Payer: Medicare Other | Source: Ambulatory Visit | Attending: Hematology | Admitting: Hematology

## 2018-02-19 ENCOUNTER — Other Ambulatory Visit (HOSPITAL_COMMUNITY): Payer: Self-pay | Admitting: Student

## 2018-02-19 ENCOUNTER — Encounter (HOSPITAL_COMMUNITY): Payer: Self-pay | Admitting: Interventional Radiology

## 2018-02-19 DIAGNOSIS — K831 Obstruction of bile duct: Secondary | ICD-10-CM | POA: Diagnosis not present

## 2018-02-19 DIAGNOSIS — T85520A Displacement of bile duct prosthesis, initial encounter: Secondary | ICD-10-CM | POA: Diagnosis not present

## 2018-02-19 DIAGNOSIS — C221 Intrahepatic bile duct carcinoma: Secondary | ICD-10-CM | POA: Diagnosis not present

## 2018-02-19 DIAGNOSIS — C7970 Secondary malignant neoplasm of unspecified adrenal gland: Secondary | ICD-10-CM | POA: Diagnosis not present

## 2018-02-19 HISTORY — PX: IR CATHETER TUBE CHANGE: IMG717

## 2018-02-19 MED ORDER — IOPAMIDOL (ISOVUE-300) INJECTION 61%
INTRAVENOUS | Status: AC
  Filled 2018-02-19: qty 50

## 2018-02-19 MED ORDER — IOPAMIDOL (ISOVUE-300) INJECTION 61%
50.0000 mL | Freq: Once | INTRAVENOUS | Status: AC | PRN
  Administered 2018-02-19: 10 mL

## 2018-02-19 MED ORDER — IOHEXOL 300 MG/ML  SOLN
100.0000 mL | Freq: Once | INTRAMUSCULAR | Status: AC | PRN
  Administered 2018-02-19: 100 mL via INTRAVENOUS

## 2018-02-19 MED ORDER — SODIUM CHLORIDE (PF) 0.9 % IJ SOLN
INTRAMUSCULAR | Status: AC
  Filled 2018-02-19: qty 50

## 2018-02-19 MED ORDER — LIDOCAINE HCL 1 % IJ SOLN
INTRAMUSCULAR | Status: AC
  Filled 2018-02-19: qty 20

## 2018-02-19 NOTE — Procedures (Signed)
Biliary obstruction, tube malposition  S/p FLUORO LEFT INT EXT BILIARY DRAIN EXCHG  No comp Stable ebl 0 Full report in pacs

## 2018-02-20 ENCOUNTER — Telehealth: Payer: Self-pay | Admitting: Hematology

## 2018-02-20 ENCOUNTER — Other Ambulatory Visit: Payer: Self-pay | Admitting: Hematology

## 2018-02-20 NOTE — Telephone Encounter (Signed)
I called the patient and discussed his restaging CT scan findings with him over the phone.  His liver metastasis is stable, unfortunately adrenal gland metastasis has significantly enlarged.  I recommend changing his treatment to FOLFOX, starting next week.  He agrees.  I will see him next week before first treatment.  [name]  02/20/2018

## 2018-02-20 NOTE — Progress Notes (Signed)
DISCONTINUE OFF PATHWAY REGIMEN - [Other Dx]   [id number]:Cisplatin 25 mg/m2 D1,8 + Gemcitabine 1,000 mg/m2 D1,8 q21 Days:   A cycle is every 21 days:     Gemcitabine      Cisplatin   **Always confirm dose/schedule in your pharmacy ordering system**  REASON: Disease Progression PRIOR TREATMENT: Cisplatin 25 mg/m2 D1,8 + Gemcitabine 1,000 mg/m2 D1,8 q21 Days TREATMENT RESPONSE: Progressive Disease (PD)  START OFF PATHWAY REGIMEN - Other Dx   [id number]:FOLFOX (q14d):   A cycle is every 14 days:     Oxaliplatin      Leucovorin      5-Fluorouracil      5-Fluorouracil   **Always confirm dose/schedule in your pharmacy ordering system**  Patient Characteristics: Intent of Therapy: Non-Curative / Palliative Intent, Discussed with Patient

## 2018-02-24 ENCOUNTER — Inpatient Hospital Stay (HOSPITAL_COMMUNITY): Payer: Medicare Other

## 2018-02-24 ENCOUNTER — Other Ambulatory Visit: Payer: Self-pay

## 2018-02-24 ENCOUNTER — Emergency Department (HOSPITAL_COMMUNITY): Payer: Medicare Other

## 2018-02-24 ENCOUNTER — Inpatient Hospital Stay (HOSPITAL_COMMUNITY)
Admission: EM | Admit: 2018-02-24 | Discharge: 2018-02-27 | DRG: 377 | Disposition: A | Discharge status: Home / Self Care | Payer: Medicare Other | Attending: Internal Medicine | Admitting: Internal Medicine

## 2018-02-24 DIAGNOSIS — R55 Syncope and collapse: Secondary | ICD-10-CM | POA: Diagnosis present

## 2018-02-24 DIAGNOSIS — C787 Secondary malignant neoplasm of liver and intrahepatic bile duct: Secondary | ICD-10-CM | POA: Diagnosis present

## 2018-02-24 DIAGNOSIS — C7971 Secondary malignant neoplasm of right adrenal gland: Secondary | ICD-10-CM | POA: Diagnosis present

## 2018-02-24 DIAGNOSIS — G4700 Insomnia, unspecified: Secondary | ICD-10-CM | POA: Diagnosis not present

## 2018-02-24 DIAGNOSIS — K2970 Gastritis, unspecified, without bleeding: Secondary | ICD-10-CM | POA: Diagnosis present

## 2018-02-24 DIAGNOSIS — E860 Dehydration: Secondary | ICD-10-CM | POA: Diagnosis not present

## 2018-02-24 DIAGNOSIS — Z8249 Family history of ischemic heart disease and other diseases of the circulatory system: Secondary | ICD-10-CM

## 2018-02-24 DIAGNOSIS — K7030 Alcoholic cirrhosis of liver without ascites: Secondary | ICD-10-CM | POA: Diagnosis not present

## 2018-02-24 DIAGNOSIS — I864 Gastric varices: Secondary | ICD-10-CM | POA: Diagnosis present

## 2018-02-24 DIAGNOSIS — D649 Anemia, unspecified: Secondary | ICD-10-CM

## 2018-02-24 DIAGNOSIS — C7802 Secondary malignant neoplasm of left lung: Secondary | ICD-10-CM | POA: Diagnosis not present

## 2018-02-24 DIAGNOSIS — E119 Type 2 diabetes mellitus without complications: Secondary | ICD-10-CM | POA: Diagnosis not present

## 2018-02-24 DIAGNOSIS — Z66 Do not resuscitate: Secondary | ICD-10-CM | POA: Diagnosis present

## 2018-02-24 DIAGNOSIS — Z9221 Personal history of antineoplastic chemotherapy: Secondary | ICD-10-CM

## 2018-02-24 DIAGNOSIS — R000 Tachycardia, unspecified: Secondary | ICD-10-CM | POA: Diagnosis not present

## 2018-02-24 DIAGNOSIS — I2510 Atherosclerotic heart disease of native coronary artery without angina pectoris: Secondary | ICD-10-CM | POA: Diagnosis present

## 2018-02-24 DIAGNOSIS — R188 Other ascites: Secondary | ICD-10-CM | POA: Diagnosis not present

## 2018-02-24 DIAGNOSIS — R110 Nausea: Secondary | ICD-10-CM | POA: Diagnosis not present

## 2018-02-24 DIAGNOSIS — I959 Hypotension, unspecified: Secondary | ICD-10-CM | POA: Diagnosis not present

## 2018-02-24 DIAGNOSIS — Z7982 Long term (current) use of aspirin: Secondary | ICD-10-CM

## 2018-02-24 DIAGNOSIS — R180 Malignant ascites: Secondary | ICD-10-CM

## 2018-02-24 DIAGNOSIS — K7460 Unspecified cirrhosis of liver: Secondary | ICD-10-CM | POA: Diagnosis not present

## 2018-02-24 DIAGNOSIS — K269 Duodenal ulcer, unspecified as acute or chronic, without hemorrhage or perforation: Secondary | ICD-10-CM | POA: Diagnosis not present

## 2018-02-24 DIAGNOSIS — R531 Weakness: Secondary | ICD-10-CM | POA: Diagnosis not present

## 2018-02-24 DIAGNOSIS — Z9049 Acquired absence of other specified parts of digestive tract: Secondary | ICD-10-CM

## 2018-02-24 DIAGNOSIS — I8500 Esophageal varices without bleeding: Secondary | ICD-10-CM | POA: Diagnosis present

## 2018-02-24 DIAGNOSIS — R42 Dizziness and giddiness: Secondary | ICD-10-CM | POA: Diagnosis not present

## 2018-02-24 DIAGNOSIS — E1142 Type 2 diabetes mellitus with diabetic polyneuropathy: Secondary | ICD-10-CM | POA: Diagnosis present

## 2018-02-24 DIAGNOSIS — I10 Essential (primary) hypertension: Secondary | ICD-10-CM | POA: Diagnosis not present

## 2018-02-24 DIAGNOSIS — D6481 Anemia due to antineoplastic chemotherapy: Secondary | ICD-10-CM | POA: Diagnosis not present

## 2018-02-24 DIAGNOSIS — E872 Acidosis, unspecified: Secondary | ICD-10-CM | POA: Diagnosis present

## 2018-02-24 DIAGNOSIS — K831 Obstruction of bile duct: Secondary | ICD-10-CM | POA: Diagnosis present

## 2018-02-24 DIAGNOSIS — Z794 Long term (current) use of insulin: Secondary | ICD-10-CM

## 2018-02-24 DIAGNOSIS — Z7983 Long term (current) use of bisphosphonates: Secondary | ICD-10-CM

## 2018-02-24 DIAGNOSIS — R896 Abnormal cytological findings in specimens from other organs, systems and tissues: Secondary | ICD-10-CM | POA: Diagnosis not present

## 2018-02-24 DIAGNOSIS — E785 Hyperlipidemia, unspecified: Secondary | ICD-10-CM | POA: Diagnosis present

## 2018-02-24 DIAGNOSIS — H53129 Transient visual loss, unspecified eye: Secondary | ICD-10-CM | POA: Diagnosis present

## 2018-02-24 DIAGNOSIS — I81 Portal vein thrombosis: Secondary | ICD-10-CM | POA: Diagnosis not present

## 2018-02-24 DIAGNOSIS — M25512 Pain in left shoulder: Secondary | ICD-10-CM | POA: Diagnosis not present

## 2018-02-24 DIAGNOSIS — K7031 Alcoholic cirrhosis of liver with ascites: Secondary | ICD-10-CM | POA: Diagnosis present

## 2018-02-24 DIAGNOSIS — Z885 Allergy status to narcotic agent status: Secondary | ICD-10-CM

## 2018-02-24 DIAGNOSIS — Z86718 Personal history of other venous thrombosis and embolism: Secondary | ICD-10-CM

## 2018-02-24 DIAGNOSIS — T451X5A Adverse effect of antineoplastic and immunosuppressive drugs, initial encounter: Secondary | ICD-10-CM | POA: Diagnosis present

## 2018-02-24 DIAGNOSIS — E871 Hypo-osmolality and hyponatremia: Secondary | ICD-10-CM | POA: Diagnosis not present

## 2018-02-24 DIAGNOSIS — R296 Repeated falls: Secondary | ICD-10-CM | POA: Diagnosis not present

## 2018-02-24 DIAGNOSIS — A419 Sepsis, unspecified organism: Secondary | ICD-10-CM

## 2018-02-24 DIAGNOSIS — C221 Intrahepatic bile duct carcinoma: Secondary | ICD-10-CM | POA: Diagnosis present

## 2018-02-24 DIAGNOSIS — K766 Portal hypertension: Secondary | ICD-10-CM | POA: Diagnosis present

## 2018-02-24 DIAGNOSIS — R51 Headache: Secondary | ICD-10-CM | POA: Diagnosis not present

## 2018-02-24 DIAGNOSIS — R161 Splenomegaly, not elsewhere classified: Secondary | ICD-10-CM | POA: Diagnosis present

## 2018-02-24 DIAGNOSIS — C799 Secondary malignant neoplasm of unspecified site: Secondary | ICD-10-CM

## 2018-02-24 DIAGNOSIS — C772 Secondary and unspecified malignant neoplasm of intra-abdominal lymph nodes: Secondary | ICD-10-CM | POA: Diagnosis not present

## 2018-02-24 DIAGNOSIS — K3189 Other diseases of stomach and duodenum: Secondary | ICD-10-CM | POA: Diagnosis not present

## 2018-02-24 DIAGNOSIS — K922 Gastrointestinal hemorrhage, unspecified: Secondary | ICD-10-CM | POA: Diagnosis present

## 2018-02-24 DIAGNOSIS — D62 Acute posthemorrhagic anemia: Secondary | ICD-10-CM | POA: Diagnosis present

## 2018-02-24 DIAGNOSIS — R569 Unspecified convulsions: Secondary | ICD-10-CM | POA: Diagnosis present

## 2018-02-24 DIAGNOSIS — W19XXXA Unspecified fall, initial encounter: Secondary | ICD-10-CM | POA: Diagnosis present

## 2018-02-24 DIAGNOSIS — Z87891 Personal history of nicotine dependence: Secondary | ICD-10-CM

## 2018-02-24 DIAGNOSIS — Z79899 Other long term (current) drug therapy: Secondary | ICD-10-CM

## 2018-02-24 DIAGNOSIS — Z85038 Personal history of other malignant neoplasm of large intestine: Secondary | ICD-10-CM

## 2018-02-24 DIAGNOSIS — C7972 Secondary malignant neoplasm of left adrenal gland: Secondary | ICD-10-CM | POA: Diagnosis present

## 2018-02-24 DIAGNOSIS — K7581 Nonalcoholic steatohepatitis (NASH): Secondary | ICD-10-CM | POA: Diagnosis present

## 2018-02-24 DIAGNOSIS — K920 Hematemesis: Secondary | ICD-10-CM | POA: Diagnosis not present

## 2018-02-24 DIAGNOSIS — F4310 Post-traumatic stress disorder, unspecified: Secondary | ICD-10-CM | POA: Diagnosis present

## 2018-02-24 DIAGNOSIS — K264 Chronic or unspecified duodenal ulcer with hemorrhage: Secondary | ICD-10-CM | POA: Diagnosis not present

## 2018-02-24 DIAGNOSIS — D72829 Elevated white blood cell count, unspecified: Secondary | ICD-10-CM

## 2018-02-24 DIAGNOSIS — Z981 Arthrodesis status: Secondary | ICD-10-CM

## 2018-02-24 DIAGNOSIS — S4992XA Unspecified injury of left shoulder and upper arm, initial encounter: Secondary | ICD-10-CM | POA: Diagnosis not present

## 2018-02-24 LAB — CBC WITH DIFFERENTIAL/PLATELET
BAND NEUTROPHILS: 0 %
Basophils Absolute: 0 10*3/uL (ref 0.0–0.1)
Basophils Relative: 0 %
Blasts: 0 %
Eosinophils Absolute: 0.4 10*3/uL (ref 0.0–0.5)
Eosinophils Relative: 1 %
HCT: 19.3 % — ABNORMAL LOW (ref 39.0–52.0)
Hemoglobin: 5.5 g/dL — CL (ref 13.0–17.0)
Lymphocytes Relative: 7 %
Lymphs Abs: 2.6 10*3/uL (ref 0.7–4.0)
MCH: 29.6 pg (ref 26.0–34.0)
MCHC: 28.5 g/dL — ABNORMAL LOW (ref 30.0–36.0)
MCV: 103.8 fL — ABNORMAL HIGH (ref 80.0–100.0)
Metamyelocytes Relative: 0 %
Monocytes Absolute: 2.6 10*3/uL — ABNORMAL HIGH (ref 0.1–1.0)
Monocytes Relative: 7 %
Myelocytes: 0 %
Neutro Abs: 31.5 10*3/uL — ABNORMAL HIGH (ref 1.7–7.7)
Neutrophils Relative %: 85 %
Other: 0 %
Platelets: 210 10*3/uL (ref 150–400)
Promyelocytes Relative: 0 %
RBC: 1.86 MIL/uL — ABNORMAL LOW (ref 4.22–5.81)
RDW: 19.5 % — ABNORMAL HIGH (ref 11.5–15.5)
WBC: 37.1 10*3/uL — ABNORMAL HIGH (ref 4.0–10.5)
nRBC: 0 /100 WBC
nRBC: 0.1 % (ref 0.0–0.2)

## 2018-02-24 LAB — COMPREHENSIVE METABOLIC PANEL
ALT: 41 U/L (ref 0–44)
AST: 42 U/L — ABNORMAL HIGH (ref 15–41)
Albumin: 2.3 g/dL — ABNORMAL LOW (ref 3.5–5.0)
Alkaline Phosphatase: 306 U/L — ABNORMAL HIGH (ref 38–126)
Anion gap: 13 (ref 5–15)
BUN: 28 mg/dL — ABNORMAL HIGH (ref 8–23)
CALCIUM: 8.4 mg/dL — AB (ref 8.9–10.3)
CO2: 14 mmol/L — ABNORMAL LOW (ref 22–32)
Chloride: 106 mmol/L (ref 98–111)
Creatinine, Ser: 0.9 mg/dL (ref 0.61–1.24)
GFR calc non Af Amer: >60 mL/min (ref >60)
Glucose, Bld: 193 mg/dL — ABNORMAL HIGH (ref 70–99)
Potassium: 4.2 mmol/L (ref 3.5–5.1)
SODIUM: 133 mmol/L — AB (ref 135–145)
Total Bilirubin: 1 mg/dL (ref 0.3–1.2)
Total Protein: 5.7 g/dL — ABNORMAL LOW (ref 6.5–8.1)

## 2018-02-24 LAB — POCT I-STAT EG7
Acid-base deficit: 9 mmol/L — ABNORMAL HIGH (ref 0.0–2.0)
Bicarbonate: 15.6 mmol/L — ABNORMAL LOW (ref 20.0–28.0)
Calcium, Ion: 1.05 mmol/L — ABNORMAL LOW (ref 1.15–1.40)
HEMATOCRIT: 17 % — AB (ref 39.0–52.0)
Hemoglobin: 5.8 g/dL — CL (ref 13.0–17.0)
O2 Saturation: 99 %
Potassium: 4.3 mmol/L (ref 3.5–5.1)
Sodium: 135 mmol/L (ref 135–145)
TCO2: 16 mmol/L — ABNORMAL LOW (ref 22–32)
pCO2, Ven: 27 mmHg — ABNORMAL LOW (ref 44.0–60.0)
pH, Ven: 7.37 (ref 7.250–7.430)
pO2, Ven: 140 mmHg — ABNORMAL HIGH (ref 32.0–45.0)

## 2018-02-24 LAB — URINALYSIS, ROUTINE W REFLEX MICROSCOPIC
Bilirubin Urine: NEGATIVE
Glucose, UA: NEGATIVE mg/dL
HGB URINE DIPSTICK: NEGATIVE
Ketones, ur: 5 mg/dL — AB
Leukocytes,Ua: NEGATIVE
Nitrite: NEGATIVE
Protein, ur: NEGATIVE mg/dL
Specific Gravity, Urine: 1.012 (ref 1.005–1.030)
pH: 5 (ref 5.0–8.0)

## 2018-02-24 LAB — CBG MONITORING, ED: Glucose-Capillary: 152 mg/dL — ABNORMAL HIGH (ref 70–99)

## 2018-02-24 LAB — CBC
HEMATOCRIT: 23.6 % — AB (ref 39.0–52.0)
Hemoglobin: 7.6 g/dL — ABNORMAL LOW (ref 13.0–17.0)
MCH: 29.8 pg (ref 26.0–34.0)
MCHC: 32.2 g/dL (ref 30.0–36.0)
MCV: 92.5 fL (ref 80.0–100.0)
Platelets: 121 10*3/uL — ABNORMAL LOW (ref 150–400)
RBC: 2.55 MIL/uL — ABNORMAL LOW (ref 4.22–5.81)
RDW: 17.6 % — ABNORMAL HIGH (ref 11.5–15.5)
WBC: 22.3 10*3/uL — ABNORMAL HIGH (ref 4.0–10.5)
nRBC: 0 % (ref 0.0–0.2)

## 2018-02-24 LAB — I-STAT TROPONIN, ED: Troponin i, poc: 0.01 ng/mL (ref 0.00–0.08)

## 2018-02-24 LAB — GLUCOSE, CAPILLARY: Glucose-Capillary: 160 mg/dL — ABNORMAL HIGH (ref 70–99)

## 2018-02-24 LAB — PROTIME-INR
INR: 1.31
Prothrombin Time: 16.1 seconds — ABNORMAL HIGH (ref 11.4–15.2)

## 2018-02-24 LAB — LACTIC ACID, PLASMA
Lactic Acid, Venous: 7.8 mmol/L (ref 0.5–1.9)
Lactic Acid, Venous: 6.2 mmol/L (ref 0.5–1.9)
Lactic Acid, Venous: 1.8 mmol/L (ref 0.5–1.9)

## 2018-02-24 LAB — MRSA PCR SCREENING: MRSA by PCR: NEGATIVE

## 2018-02-24 LAB — POC OCCULT BLOOD, ED: Fecal Occult Bld: NEGATIVE

## 2018-02-24 LAB — OCCULT BLOOD GASTRIC / DUODENUM (SPECIMEN CUP): Occult Blood, Gastric: POSITIVE — AB

## 2018-02-24 LAB — PREPARE RBC (CROSSMATCH)

## 2018-02-24 LAB — ABO/RH: ABO/RH(D): A POS

## 2018-02-24 LAB — HEMOGLOBIN AND HEMATOCRIT, BLOOD
HCT: 26 % — ABNORMAL LOW (ref 39.0–52.0)
Hemoglobin: 8.3 g/dL — ABNORMAL LOW (ref 13.0–17.0)

## 2018-02-24 MED ORDER — METRONIDAZOLE IN NACL 5-0.79 MG/ML-% IV SOLN
500.0000 mg | Freq: Three times a day (TID) | INTRAVENOUS | Status: DC
  Administered 2018-02-24 – 2018-02-27 (×9): 500 mg via INTRAVENOUS
  Filled 2018-02-24 (×8): qty 100

## 2018-02-24 MED ORDER — OCTREOTIDE LOAD VIA INFUSION
50.0000 ug | Freq: Once | INTRAVENOUS | Status: AC
  Administered 2018-02-24: 50 ug via INTRAVENOUS
  Filled 2018-02-24: qty 25

## 2018-02-24 MED ORDER — SODIUM CHLORIDE 0.9 % IV SOLN
8.0000 mg/h | INTRAVENOUS | Status: DC
  Administered 2018-02-24 – 2018-02-26 (×5): 8 mg/h via INTRAVENOUS
  Filled 2018-02-24 (×9): qty 80

## 2018-02-24 MED ORDER — SODIUM CHLORIDE 0.9 % IV BOLUS (SEPSIS)
1000.0000 mL | Freq: Once | INTRAVENOUS | Status: AC
  Administered 2018-02-24: 1000 mL via INTRAVENOUS

## 2018-02-24 MED ORDER — SODIUM CHLORIDE 0.9 % IV SOLN
50.0000 ug/h | INTRAVENOUS | Status: DC
  Administered 2018-02-24 – 2018-02-26 (×5): 50 ug/h via INTRAVENOUS
  Filled 2018-02-24 (×7): qty 1

## 2018-02-24 MED ORDER — VANCOMYCIN HCL 10 G IV SOLR
1750.0000 mg | Freq: Once | INTRAVENOUS | Status: AC
  Administered 2018-02-24: 1750 mg via INTRAVENOUS
  Filled 2018-02-24: qty 1750

## 2018-02-24 MED ORDER — SODIUM CHLORIDE 0.9 % IV BOLUS (SEPSIS)
2000.0000 mL | Freq: Once | INTRAVENOUS | Status: AC
  Administered 2018-02-24: 2000 mL via INTRAVENOUS

## 2018-02-24 MED ORDER — ONDANSETRON HCL 4 MG PO TABS: 4.0000 mg | ORAL_TABLET | Freq: Four times a day (QID) | ORAL | Status: DC | PRN

## 2018-02-24 MED ORDER — PANTOPRAZOLE SODIUM 40 MG IV SOLR: 40.0000 mg | Freq: Two times a day (BID) | INTRAVENOUS | Status: DC

## 2018-02-24 MED ORDER — VANCOMYCIN HCL 10 G IV SOLR
1250.0000 mg | Freq: Two times a day (BID) | INTRAVENOUS | Status: DC
  Administered 2018-02-24 – 2018-02-25 (×2): 1250 mg via INTRAVENOUS
  Filled 2018-02-24 (×3): qty 1250

## 2018-02-24 MED ORDER — SODIUM CHLORIDE 0.9 % IV SOLN
2.0000 g | Freq: Two times a day (BID) | INTRAVENOUS | Status: DC
  Administered 2018-02-24 – 2018-02-27 (×6): 2 g via INTRAVENOUS
  Filled 2018-02-24 (×6): qty 2

## 2018-02-24 MED ORDER — SODIUM CHLORIDE 0.9 % IV SOLN
10.0000 mL/h | Freq: Once | INTRAVENOUS | Status: AC
  Administered 2018-02-24: 10 mL/h via INTRAVENOUS

## 2018-02-24 MED ORDER — ONDANSETRON HCL 4 MG/2ML IJ SOLN
4.0000 mg | Freq: Once | INTRAMUSCULAR | Status: AC
  Administered 2018-02-24: 4 mg via INTRAVENOUS
  Filled 2018-02-24 (×2): qty 2

## 2018-02-24 MED ORDER — FENTANYL CITRATE (PF) 100 MCG/2ML IJ SOLN
50.0000 ug | Freq: Once | INTRAMUSCULAR | Status: DC
  Filled 2018-02-24 (×2): qty 2

## 2018-02-24 MED ORDER — LIDOCAINE-PRILOCAINE 2.5-2.5 % EX CREA
TOPICAL_CREAM | Freq: Once | CUTANEOUS | Status: AC
  Administered 2018-02-24: 1 via TOPICAL
  Filled 2018-02-24: qty 5

## 2018-02-24 MED ORDER — SODIUM CHLORIDE 0.9 % IV SOLN
80.0000 mg | Freq: Once | INTRAVENOUS | Status: AC
  Administered 2018-02-24: 80 mg via INTRAVENOUS
  Filled 2018-02-24: qty 80

## 2018-02-24 MED ORDER — ONDANSETRON HCL 4 MG/2ML IJ SOLN: 4.0000 mg | Freq: Four times a day (QID) | INTRAMUSCULAR | Status: DC | PRN

## 2018-02-24 MED ORDER — SODIUM CHLORIDE 0.9 % IV SOLN
2.0000 g | Freq: Once | INTRAVENOUS | Status: AC
  Administered 2018-02-24: 2 g via INTRAVENOUS
  Filled 2018-02-24: qty 2

## 2018-02-24 MED ORDER — VANCOMYCIN HCL IN DEXTROSE 1-5 GM/200ML-% IV SOLN: 1000.0000 mg | Freq: Once | INTRAVENOUS | Status: DC

## 2018-02-24 MED ORDER — FENTANYL CITRATE (PF) 100 MCG/2ML IJ SOLN
50.0000 ug | INTRAMUSCULAR | Status: DC | PRN
  Administered 2018-02-24 (×2): 50 ug via INTRAVENOUS
  Filled 2018-02-24 (×2): qty 2

## 2018-02-24 MED ORDER — SODIUM CHLORIDE 0.9% FLUSH
10.0000 mL | INTRAVENOUS | Status: DC | PRN
  Administered 2018-02-25: 20 mL
  Filled 2018-02-24: qty 40

## 2018-02-24 NOTE — ED Triage Notes (Signed)
Pt brought in by [facility]. Pt woke up around 0100 with sudden onset blindness that lasted approximately 10 minutes. Pt fell due to the disorientation of the blindness and his wife heard him. She told EMS that she witnessed full body seizure like activity and pt states that he might have been incontinent to urine. Pt has liver, lung, and gallbladder hx, states that he hasn't slept in two days and is now feeling weak and dizzy. EMS states that he might have had absent seizure in route but that the pt was able to answer questions and follow commands throughout episode with no post ictal state. BP 97/60, HR 120, SpO2 100, CBG 210, prior to arrival.

## 2018-02-24 NOTE — Progress Notes (Signed)
Matthew Lewis   DOB:1955/05/05   VX#:[id number]   [phone]  Medical oncology follow-up note  Subjective: Patient is well-known to me, under my care for his metastatic cholangiocarcinoma, currently on chemotherapy.  He presented to the hospital last night with upper GI bleeding, possible seizure activity, and severe anemia.  He has received blood transfusion and IV fluids, he feels better now.  He states he has been having some epigastric discomfort, and a worsening fatigue at home in the past few days.  No fever, productive cough or chills.  He had upper GI bleeding in 06/2016.    Objective:  Vitals:   02/24/18 1445 02/24/18 1530  BP: 118/79 102/67  Pulse: (!) 101 (!) 103  Resp: 20 18  Temp:    SpO2: 100% 100%    Body mass index is 27.12 kg/m.  Intake/Output Summary (Last 24 hours) at 02/24/2018 1559 Last data filed at 02/24/2018 1540 Gross per 24 hour  Intake 2275 ml  Output 1620 ml  Net 655 ml     Sclerae unicteric  Oropharynx clear  No peripheral adenopathy  Lungs clear -- no rales or rhonchi  Heart regular rate and rhythm  Abdomen benign, sofe, (+) right PTC capped, left PTC is draining.   MSK no focal spinal tenderness, no peripheral edema  Neuro nonfocal    CBG (last 3)  Recent Labs    02/24/18 1206  GLUCAP 152*     Labs:  CBC Latest Ref Rng & Units 02/24/2018 02/24/2018 02/24/2018  WBC 4.0 - 10.5 K/uL - - 37.1(H)  Hemoglobin 13.0 - 17.0 g/dL 8.3(L) 5.8(LL) 5.5(LL)  Hematocrit 39.0 - 52.0 % 26.0(L) 17.0(L) 19.3(L)  Platelets 150 - 400 K/uL - - 210     CMP Latest Ref Rng & Units 02/24/2018 02/24/2018 02/12/2018  Glucose 70 - 99 mg/dL - 193(H) 159(H)  BUN 8 - 23 mg/dL - 28(H) 16  Creatinine 0.61 - 1.24 mg/dL - 0.90 0.67  Sodium 135 - 145 mmol/L 135 133(L) 134(L)  Potassium 3.5 - 5.1 mmol/L 4.3 4.2 4.1  Chloride 98 - 111 mmol/L - 106 102  CO2 22 - 32 mmol/L - 14(L) 26  Calcium 8.9 - 10.3 mg/dL - 8.4(L) 9.3  Total Protein 6.5 - 8.1 g/dL - 5.7(L) 7.4   Total Bilirubin 0.3 - 1.2 mg/dL - 1.0 0.9  Alkaline Phos 38 - 126 U/L - 306(H) 398(H)  AST 15 - 41 U/L - 42(H) 41  ALT 0 - 44 U/L - 41 46(H)     Urine Studies No results for input(s): UHGB, CRYS in the last 72 hours.  Invalid input(s): UACOL, UAPR, USPG, UPH, UTP, UGL, UKET, UBIL, UNIT, UROB, ULEU, UEPI, UWBC, URBC, UBAC, CAST, UCOM, BILUA  Basic Metabolic Panel: Recent Labs  Lab 02/24/18 0245 02/24/18 0309  NA 133* 135  K 4.2 4.3  CL 106  --   CO2 14*  --   GLUCOSE 193*  --   BUN 28*  --   CREATININE 0.90  --   CALCIUM 8.4*  --    GFR Estimated Creatinine Clearance: 91 mL/min (by C-G formula based on SCr of 0.9 mg/dL). Liver Function Tests: Recent Labs  Lab 02/24/18 0245  AST 42*  ALT 41  ALKPHOS 306*  BILITOT 1.0  PROT 5.7*  ALBUMIN 2.3*   No results for input(s): LIPASE, AMYLASE in the last 168 hours. No results for input(s): AMMONIA in the last 168 hours. Coagulation profile Recent Labs  Lab 02/24/18 0245  INR  1.31    CBC: Recent Labs  Lab 02/24/18 0245 02/24/18 0309 02/24/18 1318  WBC 37.1*  --   --   NEUTROABS 31.5*  --   --   HGB 5.5* 5.8* 8.3*  HCT 19.3* 17.0* 26.0*  MCV 103.8*  --   --   PLT 210  --   --    Cardiac Enzymes: No results for input(s): CKTOTAL, CKMB, CKMBINDEX, TROPONINI in the last 168 hours. BNP: Invalid input(s): POCBNP CBG: Recent Labs  Lab 02/24/18 1206  GLUCAP 152*   D-Dimer No results for input(s): DDIMER in the last 72 hours. Hgb A1c No results for input(s): HGBA1C in the last 72 hours. Lipid Profile No results for input(s): CHOL, HDL, LDLCALC, TRIG, CHOLHDL, LDLDIRECT in the last 72 hours. Thyroid function studies No results for input(s): TSH, T4TOTAL, T3FREE, THYROIDAB in the last 72 hours.  Invalid input(s): FREET3 Anemia work up No results for input(s): VITAMINB12, FOLATE, FERRITIN, TIBC, IRON, RETICCTPCT in the last 72 hours. Microbiology No results found for this or any previous visit (from the  past 240 hour(s)).    Studies:  Ct Head Wo Contrast  Result Date: 02/24/2018 CLINICAL DATA:  Initial evaluation for acute posttraumatic headache. EXAM: CT HEAD WITHOUT CONTRAST TECHNIQUE: Contiguous axial images were obtained from the base of the skull through the vertex without intravenous contrast. COMPARISON:  Prior CT from 10/09/2010 FINDINGS: Brain: Cerebral volume within normal limits for patient age. No evidence for acute intracranial hemorrhage. No findings to suggest acute large vessel territory infarct. No mass lesion, midline shift, or mass effect. Ventricles are normal in size without evidence for hydrocephalus. No extra-axial fluid collection identified. Vascular: No hyperdense vessel identified. Skull: Scalp soft tissues demonstrate no acute abnormality. Calvarium intact. Sinuses/Orbits: Globes and orbital soft tissues within normal limits. Visualized paranasal sinuses are clear. No mastoid effusion. IMPRESSION: Negative head CT.  No acute intracranial abnormality. Electronically Signed   By: Jeannine Boga M.D.   On: 02/24/2018 05:52   Dg Chest Port 1 View  Result Date: 02/24/2018 CLINICAL DATA:  63 year old male with hypotension, possible seizure. Metastatic cholangiocarcinoma. EXAM: PORTABLE CHEST 1 VIEW COMPARISON:  Chest CT 02/19/2018. 12/03/2017 radiographs and earlier. FINDINGS: Portable AP semi upright view at 0220 hours. Right chest power port now in place. Mediastinal contours remain normal. Left lung base pulmonary nodule is subtle radiographically. Other lung markings appear stable. No pneumothorax, pulmonary edema, pleural effusion or new pulmonary opacity. Partially visible biliary drains. IMPRESSION: No new cardiopulmonary abnormality. Left lower lobe pulmonary metastasis better demonstrated by CT. Electronically Signed   By: Genevie Ann M.D.   On: 02/24/2018 03:40   Dg Shoulder Left  Result Date: 02/24/2018 CLINICAL DATA:  Left shoulder pain since a fall last night.  Initial encounter. EXAM: LEFT SHOULDER - 2+ VIEW COMPARISON:  None. FINDINGS: There is no acute bony or joint abnormality. Remote healed distal clavicle fracture is noted. Acromioclavicular osteoarthritis is seen. Soft tissues are unremarkable. IMPRESSION: No acute finding. Remote healed distal clavicle fracture. Acromioclavicular osteoarthritis. Electronically Signed   By: Inge Rise M.D.   On: 02/24/2018 08:47    Assessment: 63 y.o. with recently diagnosed metastatic cholangiocarcinoma, on palliative chemotherapy, liver cirrhosis secondary to alcohol, hypertension, diabetes, history of upper GI bleeding in June 2018, presented with upper GI bleeding and severe anemia, lactic acidosis.  1. Upper GI bleeding with severe anemia  2.  Lactic acidosis 3.  Questionable seizure activity 4.  Metastatic adenocarcinoma, likely cholangiocarcinoma 5. HTN 6. DM  7.  Obstructive jaundice, status post bilateral PTC 8.  Alcohol and NASH related liver cirrhosis 9.  History of alcohol and substance abuse, has been clean for several years 10. History of upper GI bleeding, secondary to ulcers 11.  Portal vein thrombosis, not on anticoagulation due to high risk of bleeding 12. DNR/DNI   Plan:  -lab reviewed, he has received 3u PRBC, H/H has improved appropriately -The etiology of his lactic acidosis is not clear, possibly related to his hypotension secondary to GI bleeding, he is on antibiotics for possible sepsis, cultures are pending -Appreciate GI input, he is scheduled to have upper endoscopy tomorrow -Patient is DNR/DNI, we have discussed this extensively before -His recent restaging scan showed slightly cancer progression, his overall prognosis is very poor. Pt is realistic, if overall condition get worse and not reversible, he is open to palliative care alone.  -We will hold on chemotherapy for this week -will f/u when he is in house    [name], MD 02/24/2018  3:59 PM

## 2018-02-24 NOTE — ED Provider Notes (Signed)
TIME SEEN: 2:14 AM  CHIEF COMPLAINT: Syncope versus seizure  HPI: Patient is a 63 year old male with history of alcoholic cirrhosis, hypertension, knees, stage III intrahepatic cholangiocarcinoma currently on chemotherapy who presents to the emergency department with multiple falls today.  States that he felt lightheaded prior to these episodes of falling.  There was questionable seizure activity per EMS but no family at bedside currently.  Denies history of seizures.  Has a mild headache after his falls but no neck or back pain.  Denies preceding chest pain or shortness of breath.  Is tachycardic and hypotensive here and feels warm to touch but denies recent fevers, chills, cough, abdominal pain, vomiting or diarrhea.  Last chemotherapy infusion was 2 weeks ago per his report.  Has a biliary drain in place that was exchanged on the 13th.  Denies numbness, tingling or focal weakness.  Has pale conjunctive on exam.  Denies history of previous blood transfusion.  Denies bloody stools or melena.  ROS: See HPI Constitutional: no fever  Eyes: no drainage  ENT: no runny nose   Cardiovascular:  no chest pain  Resp: no SOB  GI: no vomiting GU: no dysuria Integumentary: no rash  Allergy: no hives  Musculoskeletal: no leg swelling  Neurological: no slurred speech ROS otherwise negative  PAST MEDICAL HISTORY/PAST SURGICAL HISTORY:  Past Medical History:  Diagnosis Date  . Abscess of anal and rectal regions    horse shoe abscess  . Alcoholic cirrhosis ([place])   . Arthritis    "everywhere; especially in my spine"  . Cirrhosis ([place])    STAGE 1 CIRRHOSIS PATIENT SEES DR NAMDIGAM FOR  . Colon cancer ([place]) 05/2005   TX SURGERY WITH LYMPH NODE REMOVAL  . Cough    LAST FEW WEEKS SAW DR RAMOS LUNGS MILKY SPUTUM OCC  . DM type 2 with diabetic peripheral neuropathy (HCC)    left foot  . Fatigue   . Fistula, anal    multiple  . History of substance abuse ([place])    last alcohol was 08/2011; last  marijuania was 08/2011  . Hypertension   . PTSD (post-traumatic stress disorder)    "severe" HX OF  . Sleep deprivation     MEDICATIONS:  Prior to Admission medications   Medication Sig Start Date End Date Taking? Authorizing Provider  aspirin EC 81 MG tablet Take 81 mg by mouth daily.    [provider]  Ca Carbonate-Mag Hydroxide (ROLAIDS PO) Take 1-2 tablets by mouth 2 (two) times daily as needed (heartburn).    [provider]  celecoxib (CELEBREX) 200 MG capsule Take 200 mg by mouth at bedtime.  10/10/17   [provider]  fexofenadine (ALLEGRA) 180 MG tablet Take 180 mg by mouth at bedtime.    [provider]  gabapentin (NEURONTIN) 400 MG capsule Take 400-800 mg by mouth See admin instructions. Take 400 mg by mouth in the morning and 800 mg in the evening    [provider]  insulin detemir (LEVEMIR) 100 UNIT/ML injection Inject into the skin daily.    [provider]  lactose free nutrition (BOOST PLUS) LIQD Take 237 mLs by mouth 3 (three) times daily with meals. 12/11/17   Regalado, Jerald Kief A, MD  lidocaine-prilocaine (EMLA) cream Apply to port site 1 hour prior to access 01/20/18   [name], MD  Multiple Vitamin (MULTIVITAMIN) tablet Take 1 tablet by mouth daily.    [provider]  tamsulosin (FLOMAX) 0.4 MG CAPS capsule TAKE 1  CAPSULE(0.4 MG) BY MOUTH DAILY AFTER SUPPER 02/05/18   [name], MD    ALLERGIES:  Allergies  Allergen Reactions  . Morphine And Related Other (See Comments)    Patient is recovering from drug addiction and wants to avoid any narcotics    SOCIAL HISTORY:  Social History   Tobacco Use  . Smoking status: Former Smoker    Packs/day: 1.00    Years: 10.00    Pack years: 10.00    Types: Cigarettes    Last attempt to quit: 01/08/1991    Years since quitting: 27.1  . Smokeless tobacco: Never Used  Substance Use Topics  . Alcohol use: Yes    Comment: 06/05/11 "drank enough when I was younger  to last me my whole life; don't remember when I had my last drink, maybe 2012"    FAMILY HISTORY: Family History  Problem Relation Age of Onset  . Heart disease Father   . Heart disease Mother   . Cancer Mother   . Cancer Sister   . Heart disease Brother     EXAM: BP (!) 95/59 (BP Location: Right Arm)   Pulse (!) 119   Temp 97.6 F (36.4 C) (Oral)   Resp 20   Ht 6' (1.829 m)   Wt 90.7 kg   SpO2 98%   BMI 27.12 kg/m  CONSTITUTIONAL: Alert and oriented x 3 and responds appropriately to questions.  Chronically ill-appearing. HEAD: Normocephalic, atraumatic EYES: Conjunctivae clear, pale conjunctive a, pupils appear equal, EOMI ENT: normal nose; moist mucous membranes, no dental injury or septal hematoma NECK: Supple, no meningismus, no nuchal rigidity, no LAD, no midline cervical spine tenderness or step-off or deformity, cervical collar in place CARD: Regular and tachycardic; S1 and S2 appreciated; no murmurs, no clicks, no rubs, no gallops RESP: Normal chest excursion without splinting or tachypnea; breath sounds clear and equal bilaterally; no wheezes, no rhonchi, no rales, no hypoxia or respiratory distress, speaking full sentences ABD/GI: Normal bowel sounds; non-distended; soft, non-tender, no rebound, no guarding, no peritoneal signs, no hepatosplenomegaly, biliary drain in place in the center of the upper abdomen without redness, warmth or drainage surrounding the tube BACK:  The back appears normal and is non-tender to palpation, there is no CVA tenderness, no midline spinal tenderness or step-off or deformity EXT: Normal ROM in all joints; non-tender to palpation; no edema; normal capillary refill; no cyanosis, no calf tenderness or swelling    SKIN: Appears jaundiced, warm; no rash NEURO: Moves all extremities equally, no drift, sensation to light touch intact diffusely, cranial nerves II through XII intact, normal speech, no asterixis PSYCH: The patient's mood and manner  are appropriate. Grooming and personal hygiene are appropriate.  MEDICAL DECISION MAKING: Patient here with syncopal events versus seizure.  No focal neurologic deficits on exam.  No preceding chest pain or shortness of breath.  He is hypertensive and tachycardic.  Will check rectal temperature and give IV fluids.  Patient may be septic especially given he is immunocompromised.  Will obtain labs, urine, chest x-ray and cultures.  He may also be anemic given he is on chemotherapy.  He has pale conjunctive a today but no complaints of bloody stools or melena.  States he is having a mild headache today.  Will obtain CT of the head to evaluate for any traumatic injury as well to evaluate for possible metastasis.  No cervical spine tenderness on exam and neurologically intact.  Cervical collar removed.  Will give fentanyl for pain  control.  Anticipate admission.  ED PROGRESS: Temperature is normal.  He denies any recent infectious symptoms.  Patient has a leukocytosis of 37,000 and has previously had ESBL bacteremia.  Will give broad-spectrum antibiotics for possible sepsis.  Does report he received a recent injection for "my bone marrow" recently.  This could also be the cause of his leukocytosis.  Will give 3 L of IV fluids for 30 mL/kg IV fluid bolus.  Patient's lactate is 7.8.  This could be secondary to sepsis or due to possible seizure.  Per EMS, wife witnessed a generalized tonic-clonic seizure.  She is not at bedside to confirm this history.  Hemoglobin is 5.8.  Will transfuse 3 units of emergent blood.  He is Hemoccult negative.  He may be hypertensive and tachycardic secondary to symptomatic anemia.  Suspect his anemia is secondary to chemotherapy.  4:15 AM  Pt began vomiting what appeared to be blood in the emergency department.  Appears maroon-colored with gastric contents that appeared to be food.  Gastric occult sent.  Initially hypotensive in the 70s but this has improved into the 100s.  He is  still mentating normally.  Reports history of ulcers but no history of varices.  Stop drinking alcohol in 2013.  Will start IV Protonix drip and bolus.   7:17 AM Discussed patient's case with hospitalist, Dr. Olevia Bowens.  I have recommended admission and patient (and family if present) agree with this plan. Admitting physician will place admission orders.   I reviewed all nursing notes, vitals, pertinent previous records, EKGs, lab and urine results, imaging (as available).    EKG Interpretation  Date/Time:  Tuesday February 24 2018 02:04:40 EST Ventricular Rate:  119 PR Interval:    QRS Duration: 98 QT Interval:  316 QTC Calculation: 445 R Axis:   96 Text Interpretation:  Sinus tachycardia Ventricular premature complex Right axis deviation Borderline repolarization abnormality Baseline wander in lead(s) V4 No significant change since last tracing Confirmed by Pryor Curia [phone]) on 02/24/2018 2:14:24 AM        CRITICAL CARE Performed by: Cyril Mourning Jehieli Brassell   Total critical care time: 85 minutes  Critical care time was exclusive of separately billable procedures and treating other patients.  Critical care was necessary to treat or prevent imminent or life-threatening deterioration.  Critical care was time spent personally by me on the following activities: development of treatment plan with patient and/or surrogate as well as nursing, discussions with consultants, evaluation of patient's response to treatment, examination of patient, obtaining history from patient or surrogate, ordering and performing treatments and interventions, ordering and review of laboratory studies, ordering and review of radiographic studies, pulse oximetry and re-evaluation of patient's condition.    Kamoni Depree, Delice Bison, DO 02/24/18 [phone]

## 2018-02-24 NOTE — ED Notes (Signed)
Patient transported to CT 

## 2018-02-24 NOTE — Progress Notes (Signed)
Matthew Lewis [id number] Admission Data: 02/24/2018 8:10 PM Attending Provider: Reubin Milan, MD  IOE:[id number], Matthew Kaufmann, MD Consults/ Treatment Team: Treatment Team:  Ladene Artist, MD  Matthew Lewis is a 63 y.o. male patient admitted from ED awake, alert  & orientated  X 3,  Full Code, VSS - Blood pressure (!) 78/68, pulse 100, temperature 97.8 F (36.6 C), temperature source Oral, resp. rate 20, height 6' (1.829 m), weight 88.9 kg, SpO2 100 %., O2    RA nasal cannular, no c/o shortness of breath, no c/o chest pain, no distress noted. Progressive monitor #2 placed and pt is currently running:normal sinus rhythm.   IV site WDL:  wrist left, condition patent and no redness and antecubital left, Right chest port accessed condition patent and no redness with a transparent dsg that's clean dry and intact.  Allergies:   Allergies  Allergen Reactions  . Morphine And Related Other (See Comments)    Patient is recovering from drug addiction and wants to avoid any narcotics     Past Medical History:  Diagnosis Date  . Abscess of anal and rectal regions    horse shoe abscess  . Alcoholic cirrhosis ([place])   . Arthritis    "everywhere; especially in my spine"  . Cirrhosis ([place])    STAGE 1 CIRRHOSIS PATIENT SEES DR NAMDIGAM FOR  . Colon cancer ([place]) 05/2005   TX SURGERY WITH LYMPH NODE REMOVAL  . Cough    LAST FEW WEEKS SAW DR RAMOS LUNGS MILKY SPUTUM OCC  . DM type 2 with diabetic peripheral neuropathy ([facility]CC)    left foot  . Fatigue   . Fistula, anal    multiple  . History of substance abuse ([place])    last alcohol was 08/2011; last marijuania was 08/2011  . Hypertension   . PTSD (post-traumatic stress disorder)    "severe" HX OF  . Sleep deprivation       Pt orientation to unit, room and routine. Information packet given to patient/family and safety video watched.  Admission INP armband ID verified with patient/family, and in place. SR up x 2, fall risk assessment  complete with Patient and family verbalizing understanding of risks associated with falls. Pt verbalizes an understanding of how to use the call bell and to call for help before getting out of bed.  Skin, clean-dry- intact without evidence of bruising, or skin tears.   No evidence of skin break down noted on exam. no rashes, no ecchymoses, no petechiae, no nodules, no wounds    Will cont to monitor and assist as needed.  Tresa Endo, RN 02/24/2018 8:10 PM

## 2018-02-24 NOTE — H&P (Addendum)
History and Physical    Matthew Lewis MVE:[id number] DOB: February 07, 1955 DOA: 02/24/2018  PCP: [name], MD   Patient coming from: Home.  I have personally briefly reviewed patient's old medical records in [place]  Chief Complaint: Blindness and disorientation.  HPI: Matthew Lewis is a 63 y.o. male with medical history significant of anal/rectal abscesses, alcoholic cirrhosis, history of substance abuse, PTSD, hypertension, type 2 diabetes, diabetic peripheral neuropathy, history of colon cancer, history of colectomy with lymph node removal, metastatic colon carcinoma who is brought to the emergency department after having sudden onset of blindness for about 10 minutes followed by disorientation.  His wife told the EMS crew that she witnessed generalized seizure activity.  The patient states that he has had insomnia for the past 2 days.  He went to sleep around 2200 last night and slept for a solid hour.  He woke up to go to the bathroom and blacked out.  He woke up and crawled to the bathroom and urinated on himself.  He states that he fell again on the floor and hit his head.  His wife asked his son to call 911.  Once in the ER, the patient had an episode of coffee-ground emesis that was positive for occult blood.  He states that he has been having lightheadedness and palpitations for the past few days but denies abdominal pain, nausea, diarrhea, constipation, melena or hematochezia.  He denies using NSAIDs all other than his 81 mg aspirin daily.  He denies fever, chills, but complains of fatigue and mild dyspnea.  No sore throat, rhinorrhea, hemoptysis, wheezing, but gets occasional cough.  Denies chest pain, diaphoresis, PND, orthopnea, but occasionally gets lower extremity edema.  Denies dysuria, frequency or hematuria.  No polyuria, polydipsia, polyphagia or blurred vision.  ED Course: Initial vital signs temperature 97.6 F, pulse 119, respirations 20, blood pressure 95/59  mmHg and O2 sat 98% on room air.  The patient received 3000 mL of normal saline, vancomycin, cefepime  metronidazole IVPB and was loaded with pantoprazole and an started on a continuous infusion.  His troponin was normal.  Urinalysis shows ketonuria 5 mg/dL but was otherwise negative.  Fecal occult blood was negative.  Gastric contents occult blood was positive.  WBC was 37.1, with 85% neutrophils, but the patient received Neupogen recently.  Hemoglobin was 5.5 g/dL and platelets 210.  His lactic acid was 7.8 and then 6.2 mmol/L.  PT 16.1 seconds and INR 1.31.  CMP shows a sodium of 133 and CO2 of 14 mmol/L.  All other electrolytes are normal when calcium is corrected to albumin.  Glucose 193, BUN 28, creatinine 0.9 and calcium 8.4 mg/dL.  His total protein was 5.7 and albumin 2.3 g/dL.  AST was 42 and alkaline phosphatase 306 units/L.  Bilirubin was normal.  Imaging: his chest radiograph did not show any acute cardiopulmonary abnormality.  Left lower lobe pulmonary metastases was again seen.  Left shoulder 2 view radiograph has a remote healed distal clavicle fracture and some AC osteoarthritis, but there was no acute finding.  Head CT was negative.  Review of Systems: As per HPI otherwise 10 point review of systems negative.   Past Medical History:  Diagnosis Date  . Abscess of anal and rectal regions    horse shoe abscess  . Alcoholic cirrhosis ([place])   . Arthritis    "everywhere; especially in my spine"  . Cirrhosis ([place])    STAGE 1 CIRRHOSIS PATIENT SEES DR NAMDIGAM FOR  .  Colon cancer ([place]) 05/2005   TX SURGERY WITH LYMPH NODE REMOVAL  . Cough    LAST FEW WEEKS SAW DR RAMOS LUNGS MILKY SPUTUM OCC  . DM type 2 with diabetic peripheral neuropathy (HCC)    left foot  . Fatigue   . Fistula, anal    multiple  . History of substance abuse ([place])    last alcohol was 08/2011; last marijuania was 08/2011  . Hypertension   . PTSD (post-traumatic stress disorder)    "severe" HX OF  . Sleep  deprivation     Past Surgical History:  Procedure Laterality Date  . ABSCESS DRAINAGE     "probably 15-20 so far at least; rectal"  . ANAL FISTULECTOMY  12/13/1998  . ANTERIOR FUSION CERVICAL SPINE  2010   "triple"; "did 2 surgeries on the same day in [id number] due to complications"  . CORONARY ANGIOPLASTY  2001  . detached muscle  2010   right chest; "after cervical fusion complications"  . ELBOW SURGERY  2007   "cut out part of a muscle"; left  . EXAMINATION UNDER ANESTHESIA  03/22/2005   fistula  . EXAMINATION UNDER ANESTHESIA  06/06/2011   Procedure: EXAM UNDER ANESTHESIA;  Surgeon: Stark Klein, MD;  Location: [facility];  Service: General;  Laterality: N/A;  . HEMICOLECTOMY  06/06/2005   left  . INCISION AND DRAINAGE PERIRECTAL ABSCESS  03/30/2005  . INCISION AND DRAINAGE PERIRECTAL ABSCESS  12/13/2004  . INCISION AND DRAINAGE PERIRECTAL ABSCESS  07/14/2001  . INCISION AND DRAINAGE PERIRECTAL ABSCESS  08/12/2010   horseshoe abscess; Dr Redmond Pulling  . INCISION AND DRAINAGE PERIRECTAL ABSCESS  06/06/2011   Procedure: IRRIGATION AND DEBRIDEMENT PERIRECTAL ABSCESS;  Surgeon: Stark Klein, MD;  Location: [facility];  Service: General;  Laterality: N/A;  . INCISION AND DRAINAGE PERIRECTAL ABSCESS N/A 02/24/2017   Procedure: IRRIGATION AND DEBRIDEMENT PERIRECTAL ABSCESS;  Surgeon: Ralene Ok, MD;  Location: [facility];  Service: General;  Laterality: N/A;  . IR CATHETER TUBE CHANGE  02/19/2018  . IR CHOLANGIOGRAM EXISTING TUBE  12/23/2017  . IR CHOLANGIOGRAM EXISTING TUBE  01/02/2018  . IR EXCHANGE BILIARY DRAIN  11/07/2017  . IR EXCHANGE BILIARY DRAIN  12/09/2017  . IR EXCHANGE BILIARY DRAIN  02/03/2018  . IR EXCHANGE BILIARY DRAIN  02/03/2018  . IR IMAGING GUIDED PORT INSERTION  12/09/2017  . IR INT EXT BILIARY DRAIN WITH CHOLANGIOGRAM  10/26/2017  . IR INT EXT BILIARY DRAIN WITH CHOLANGIOGRAM  12/23/2017  . IR PATIENT EVAL TECH 0-60 MINS  12/02/2017  . IR RADIOLOGIST EVAL & MGMT  11/06/2017  . IR RADIOLOGIST  EVAL & MGMT  01/05/2018  . [place]  . PERCUTANEOUS PINNING PHALANX FRACTURE OF HAND  ~ 2008   "plates in 2 places"; right  . PLACEMENT OF SETON N/A 09/05/2017   Procedure: PLACEMENT OF SETON;  Surgeon: Leighton Ruff, MD;  Location: [facility];  Service: General;  Laterality: N/A;  . RADIOLOGY WITH ANESTHESIA N/A 10/25/2017   Procedure: MRI WITH ANESTHESIA;  Surgeon: Radiologist, Medication, MD;  Location: [facility];  Service: Radiology;  Laterality: N/A;  . THORACIC DISCECTOMY  1990's     reports that he quit smoking about 27 years ago. His smoking use included cigarettes. He has a 10.00 pack-year smoking history. He has never used smokeless tobacco. He reports current alcohol use. He reports previous drug use. Drugs: Oxycodone and Marijuana.  Allergies  Allergen Reactions  . Morphine And Related Other (See Comments)  Patient is recovering from drug addiction and wants to avoid any narcotics    Family History  Problem Relation Age of Onset  . Heart disease Father   . Heart disease Mother   . Cancer Mother   . Cancer Sister   . Heart disease Brother    Prior to Admission medications   Medication Sig Start Date End Date Taking? Authorizing Provider  aspirin EC 81 MG tablet Take 81 mg by mouth daily.    [provider]  Ca Carbonate-Mag Hydroxide (ROLAIDS PO) Take 1-2 tablets by mouth 2 (two) times daily as needed (heartburn).    [provider]  celecoxib (CELEBREX) 200 MG capsule Take 200 mg by mouth at bedtime.  10/10/17   [provider]  fexofenadine (ALLEGRA) 180 MG tablet Take 180 mg by mouth at bedtime.    [provider]  gabapentin (NEURONTIN) 400 MG capsule Take 400-800 mg by mouth See admin instructions. Take 400 mg by mouth in the morning and 800 mg in the evening    [provider]  insulin detemir (LEVEMIR) 100 UNIT/ML injection Inject into the skin daily.    [provider]  lactose  free nutrition (BOOST PLUS) LIQD Take 237 mLs by mouth 3 (three) times daily with meals. 12/11/17   Regalado, Jerald Kief A, MD  lidocaine-prilocaine (EMLA) cream Apply to port site 1 hour prior to access 01/20/18   [name], MD  Multiple Vitamin (MULTIVITAMIN) tablet Take 1 tablet by mouth daily.    [provider]  tamsulosin (FLOMAX) 0.4 MG CAPS capsule TAKE 1 CAPSULE(0.4 MG) BY MOUTH DAILY AFTER SUPPER 02/05/18   [name], MD    Physical Exam: Vitals:   02/24/18 0715 02/24/18 0800 02/24/18 0815 02/24/18 0845  BP: 97/64 110/66 105/63 113/87  Pulse: (!) 110 (!) 111 (!) 112 (!) 118  Resp: 16     Temp:      TempSrc:      SpO2: 100% 100% 100% 100%  Weight:      Height:        Constitutional: Looks chronically ill, but currently in NAD, calm, comfortable Eyes: PERRL, lids and conjunctivae are pale. ENMT: Mucous membranes are moist. Posterior pharynx clear of any exudate or lesions.Normal dentition.  Neck: normal, supple, no masses, no thyromegaly Respiratory: clear to auscultation bilaterally, no wheezing, no crackles. Normal respiratory effort. No accessory muscle use.  Cardiovascular: Tachycardic at 110 bpm, no murmurs / rubs / gallops. No extremity edema. 2+ pedal pulses. No carotid bruits.  Abdomen: Positive biliary drain in place.  Nondistended, soft, mild epigastric tenderness, no guarding or rebound, no masses palpated. No hepatosplenomegaly. Bowel sounds positive.  Musculoskeletal: no clubbing / cyanosis. Good ROM, no contractures. Normal muscle tone.  Skin: no gross rashes, lesions, ulcers on limited dermatological examination Neurologic: CN 2-12 grossly intact. Sensation intact, DTR normal. Strength 5/5 in all 4.  Psychiatric: Normal judgment and insight. Alert and oriented x 3. Normal mood.   Labs on Admission: I have personally reviewed following labs and imaging studies  CBC: Recent Labs  Lab 02/24/18 0245 02/24/18 0309  WBC 37.1*  --   NEUTROABS 31.5*  --   HGB  5.5* 5.8*  HCT 19.3* 17.0*  MCV 103.8*  --   PLT 210  --    Basic Metabolic Panel: Recent Labs  Lab 02/24/18 0245 02/24/18 0309  NA 133* 135  K 4.2 4.3  CL 106  --   CO2 14*  --   GLUCOSE  193*  --   BUN 28*  --   CREATININE 0.90  --   CALCIUM 8.4*  --    GFR: Estimated Creatinine Clearance: 91 mL/min (by C-G formula based on SCr of 0.9 mg/dL). Liver Function Tests: Recent Labs  Lab 02/24/18 0245  AST 42*  ALT 41  ALKPHOS 306*  BILITOT 1.0  PROT 5.7*  ALBUMIN 2.3*   No results for input(s): LIPASE, AMYLASE in the last 168 hours. No results for input(s): AMMONIA in the last 168 hours. Coagulation Profile: Recent Labs  Lab 02/24/18 0245  INR 1.31   Cardiac Enzymes: No results for input(s): CKTOTAL, CKMB, CKMBINDEX, TROPONINI in the last 168 hours. BNP (last 3 results) No results for input(s): PROBNP in the last 8760 hours. HbA1C: No results for input(s): HGBA1C in the last 72 hours. CBG: No results for input(s): GLUCAP in the last 168 hours. Lipid Profile: No results for input(s): CHOL, HDL, LDLCALC, TRIG, CHOLHDL, LDLDIRECT in the last 72 hours. Thyroid Function Tests: No results for input(s): TSH, T4TOTAL, FREET4, T3FREE, THYROIDAB in the last 72 hours. Anemia Panel: No results for input(s): VITAMINB12, FOLATE, FERRITIN, TIBC, IRON, RETICCTPCT in the last 72 hours. Urine analysis:    Component Value Date/Time   COLORURINE YELLOW 02/24/2018 0536   APPEARANCEUR CLEAR 02/24/2018 0536   LABSPEC 1.012 02/24/2018 0536   PHURINE 5.0 02/24/2018 0536   GLUCOSEU NEGATIVE 02/24/2018 0536   HGBUR NEGATIVE 02/24/2018 0536   BILIRUBINUR NEGATIVE 02/24/2018 0536   KETONESUR 5 (A) 02/24/2018 0536   PROTEINUR NEGATIVE 02/24/2018 0536   UROBILINOGEN 0.2 08/15/2011 1816   NITRITE NEGATIVE 02/24/2018 0536   LEUKOCYTESUR NEGATIVE 02/24/2018 0536    Radiological Exams on Admission: Ct Head Wo Contrast  Result Date: 02/24/2018 CLINICAL DATA:  Initial evaluation for  acute posttraumatic headache. EXAM: CT HEAD WITHOUT CONTRAST TECHNIQUE: Contiguous axial images were obtained from the base of the skull through the vertex without intravenous contrast. COMPARISON:  Prior CT from 10/09/2010 FINDINGS: Brain: Cerebral volume within normal limits for patient age. No evidence for acute intracranial hemorrhage. No findings to suggest acute large vessel territory infarct. No mass lesion, midline shift, or mass effect. Ventricles are normal in size without evidence for hydrocephalus. No extra-axial fluid collection identified. Vascular: No hyperdense vessel identified. Skull: Scalp soft tissues demonstrate no acute abnormality. Calvarium intact. Sinuses/Orbits: Globes and orbital soft tissues within normal limits. Visualized paranasal sinuses are clear. No mastoid effusion. IMPRESSION: Negative head CT.  No acute intracranial abnormality. Electronically Signed   By: Jeannine Boga M.D.   On: 02/24/2018 05:52   Dg Chest Port 1 View  Result Date: 02/24/2018 CLINICAL DATA:  63 year old male with hypotension, possible seizure. Metastatic cholangiocarcinoma. EXAM: PORTABLE CHEST 1 VIEW COMPARISON:  Chest CT 02/19/2018. 12/03/2017 radiographs and earlier. FINDINGS: Portable AP semi upright view at 0220 hours. Right chest power port now in place. Mediastinal contours remain normal. Left lung base pulmonary nodule is subtle radiographically. Other lung markings appear stable. No pneumothorax, pulmonary edema, pleural effusion or new pulmonary opacity. Partially visible biliary drains. IMPRESSION: No new cardiopulmonary abnormality. Left lower lobe pulmonary metastasis better demonstrated by CT. Electronically Signed   By: Genevie Ann M.D.   On: 02/24/2018 03:40   Dg Shoulder Left  Result Date: 02/24/2018 CLINICAL DATA:  Left shoulder pain since a fall last night. Initial encounter. EXAM: LEFT SHOULDER - 2+ VIEW COMPARISON:  None. FINDINGS: There is no acute bony or joint abnormality.  Remote healed distal clavicle fracture is noted.  Acromioclavicular osteoarthritis is seen. Soft tissues are unremarkable. IMPRESSION: No acute finding. Remote healed distal clavicle fracture. Acromioclavicular osteoarthritis. Electronically Signed   By: Inge Rise M.D.   On: 02/24/2018 08:47    EKG: Independently reviewed. Vent. rate 119 BPM PR interval * ms QRS duration 98 ms QT/QTc 316/445 ms P-R-T axes 70 96 -26 Sinus tachycardia Ventricular premature complex Right axis deviation Borderline repolarization abnormality Baseline wander in lead(s) V4 No significant change since last tracing  Assessment/Plan Principal Problem:   UGI bleed Admit to progressive unit/inpatient. Keep n.p.o. Continue PRBC transfusion. Follow-up hematocrit and hemoglobin. Continue Protonix infusion. Start Sandostatin infusion. Gastroenterology has been consulted.  Active Problems:   Lactic acidosis Seizure, blood loss or sepsis? The patient seems to be much better clinically. Awaiting for follow-up result. Although do not strongly suspect sepsis, will continue IV antibiotics for now.    Acute blood loss anemia Continue PRBC transfusion. Monitor hematocrit and hemoglobin. Transfuse as needed.    Hyperlipemia Not on medical therapy.    Type 2 diabetes mellitus (HCC) Currently n.p.o. CBG monitoring every 6 hours.    CAD (coronary artery disease) Denies chest pain. Hold aspirin.    Hyponatremia Secondary to emesis. Received 3000 mL of NS bolus. Follow-up sodium level.    Cholangiocarcinoma [facility]) Follow-up with oncology team. Continue daily drainage of biliary drain exchange   DVT prophylaxis: SCDs. Code Status: Full code. Family Communication: Disposition Plan: Admit for UGI treatment and further work-up. Consults called: [place] gastroenterology. Admission status: Inpatient/stepdown.   Reubin Milan MD [facility]  02/24/2018, 8:58 AM

## 2018-02-24 NOTE — Progress Notes (Signed)
Pharmacy Antibiotic Note  Matthew Lewis is a 63 y.o. male admitted on 02/24/2018 with sepsis.  Pharmacy has been consulted for Vancomycin and Cefepime dosing. Pt also on Flagyl. Noted pt is immunocompromised as receiving chemotherapy.  Plan: Cefepime 2gm IV q12h Vancomycin 1750mg  IV load then 1250 mg IV Q 12 hrs. Goal AUC 400-550. Expected AUC: 530 SCr used: 1 Will f/u renal function, micro data, and pt's clinical condition Vanc levels prn  Height: 6' (182.9 cm) Weight: 200 lb (90.7 kg) IBW/kg (Calculated) : 77.6  Temp (24hrs), Avg:97.9 F (36.6 C), Min:97.6 F (36.4 C), Max:98.2 F (36.8 C)  Recent Labs  Lab 02/24/18 0245 02/24/18 0247 02/24/18 0351  WBC 37.1*  --   --   CREATININE 0.90  --   --   LATICACIDVEN  --  7.8* 6.2*    Estimated Creatinine Clearance: 91 mL/min (by C-G formula based on SCr of 0.9 mg/dL).    Allergies  Allergen Reactions  . Morphine And Related Other (See Comments)    Patient is recovering from drug addiction and wants to avoid any narcotics    Antimicrobials this admission: 2/18 Vanc >>  2/18 Cefepime >>  2/18 Flagyl>>  Microbiology results: 2/18 BCx:   Thank you for allowing pharmacy to be a part of this patient's care.  Sherlon Handing, PharmD, BCPS Clinical pharmacist  **Pharmacist phone directory can now be found on [email or web address] ([email or web address]).  Listed under [place]. 02/24/2018 6:18 AM

## 2018-02-24 NOTE — Consult Note (Addendum)
Referring Provider: No ref. provider found Primary Care Physician:  [name], MD Primary Gastroenterologist:  Dr. Silverio Decamp   Reason for Consultation: Intrahepatic cholangiocarcinoma with sepsis and profound anemia  HPI: Matthew Lewis is a 63 y.o. male with a history of alcoholic cirrhosis, hypertension, diabetes mellitus type 2, diabetic neuropathy, PTSD, anal/rectal abscesses, colon cancer s/p sigmoid resection in 2007, splenic vein thrombosis and hepatic cholangiocarcinoma initially diagnosed 11/06/2017 with metastasis to the liver, lung and right adrenal gland who presented to the emergency department this morning after feeling dizzy and fell at home earlier today. He "blacked out" briefly and crawled to the bathroom. He was incontinent of urine then stood up and fell against the wall, vision was impaired briefly. He had 2 seizures as witnessed by his wife and paramedics. No prior history of a seizure disorder. He reported having nausea and overall did not feel well since yesterday. He vomited coffee ground emesis while in the ED. He denies having any upper or lower abdominal pain. Infrequent heartburn relieved by taking TUMs. Passing a normal brown BM daily. No melena or rectal bleeding. He takes ASA 105m once daily and Celebrex 2019monce daily.    ED course: WBC 37.1. Hemoglobin 5.5.  Hematocrit 19.3.  MCV 103.8.  Platelet 210.  ANC 31.5.  Sodium 133.  Potassium 4.2.  Glucose 193.  BUN 28.  Creatinine 0.90.  Anion gap 13.  Ionized calcium 1.05.  Alk phos 306.  Albumin 2.3.  AST 42.  ALT 41.  Total bili 1.0.  INR 1.1.  PT 14.2. PRBC transfusion x 3 ordered. Stool Hemoccult negative. He vomited coffee ground emesis in the ER. Emesis was hemoccult positive.  IV Protonix bolus and drip initiated in the ED. CT of the head was negative.  Intrahepatic cholangiocarcinoma with metastasis summary: He developed obstructive jaundice 10/2017. He was seen by his PCP and LFTs were grossly  elevated. He underwent multiple imaging studies as reported below. Soft tissue core biopsy of the right adrenal mass positive for adenocarcinoma with metastasis. He is followed by oncologist, Dr. YaTruitt [name] Initial chemotherapy treatment gemcitabine/cisplatin on days 1 and 8 every 21 days started 12/18/2017. Repeat CT showed growing adrenal metastasis with stable liver metastasis, Dr. FeBurr Medicoiscussed changing chemo to Folfox. He was scheduled to start Folfox today.  Review of image studies:  He underwent an abdominal sonogram 10/24/2018 which identified a cirrhotic appearance of the liver, a 2.6 cm ill-defined mass in segment 6 of the liver.  Splenomegaly. Apparent thickening of the gallbladder wall without cholelithiasis.  An abdominal MRI/MRCP 10/26/2018 which identified advanced cirrhosis, multifocal enhancing liver lesions, A lesion obstructs the right medial hepatic duct and left main duct resulting in moderate to market biliary ductal dilatation within the left lobe of the liver.  A tumor thrombus with a left branch of the portal vein.  Bilateral adrenal metastasis and retroperitoneal nodal metastasis.  Stigmata poor portal venous hypertension including varices, ascites and splenomegaly.  He underwent numerous abd/pelvic CTs since 10/2017. His most recent image studies as follows: Chest/Abdominal/Pelvic CT 02/19/2018 showed a infiltrative mass within the left hepatic lobe which was grossly similar when compared to prior exam, additional lesions within the liver were grossly similar.  Biliary drain within the left hepatic lobe with resultant decompression of the left hepatic lobe biliary system.  Similar-appearing sharply marginated cons consolidation within the right lower lobe which may represent infectious/inflammatory process or metastatic disease.  Large amount of stool throughout the colon.  Persistent  left hepatic lobe portal venous thrombosis.  Splenomegaly.  New moderate volume  ascites  10/26/2017 internal/external biliary drain with cholangiogram by IR. 11/07/2017, 12/09/2017 internal/external biliary drain replacement of internal/external biliary drain by IR. Repeat CT 12/23/2017  showed a persistent obstruction of the left biliary system, right anterior system poorly drained, placement of a left sided biliary drain in addition to the existing right biliary drain. 02/03/2018 internal/external biliary drain exchange.  02/19/2018 internal/external biliary exchange and resposition left internal/external biliary drain.  11/04/2017 colonoscopy: Seton in place, congested mucosa in the rectum, in the sigmoid colon and in the descending colon, a few diminutive polyps in the rectum and in the sigmoid colon.  No colon or rectal mass identified.  06/19/2016 EGD identified nonbleeding grade 1 esophageal varices, gastritis with hemorrhage, multiple nonbleeding duodenal ulcers without stigmata of bleeding.  Repeat surveillance EGD in 2 years recommended.   Past Medical History:  Diagnosis Date  . Abscess of anal and rectal regions    horse shoe abscess  . Alcoholic cirrhosis ([place])   . Arthritis    "everywhere; especially in my spine"  . Cirrhosis ([place])    STAGE 1 CIRRHOSIS PATIENT SEES DR NAMDIGAM FOR  . Colon cancer ([place]) 05/2005   TX SURGERY WITH LYMPH NODE REMOVAL  . Cough    LAST FEW WEEKS SAW DR RAMOS LUNGS MILKY SPUTUM OCC  . DM type 2 with diabetic peripheral neuropathy ([facility])    left foot  . Fatigue   . Fistula, anal    multiple  . History of substance abuse ([place])    last alcohol was 08/2011; last marijuania was 08/2011  . Hypertension   . PTSD (post-traumatic stress disorder)    "severe" HX OF  . Sleep deprivation     Past Surgical History:  Procedure Laterality Date  . ABSCESS DRAINAGE     "probably 15-20 so far at least; rectal"  . ANAL FISTULECTOMY  12/13/1998  . ANTERIOR FUSION CERVICAL SPINE  2010   "triple"; "did 2 surgeries on the same day in  7793 due to complications"  . CORONARY ANGIOPLASTY  2001  . detached muscle  2010   right chest; "after cervical fusion complications"  . ELBOW SURGERY  2007   "cut out part of a muscle"; left  . EXAMINATION UNDER ANESTHESIA  03/22/2005   fistula  . EXAMINATION UNDER ANESTHESIA  06/06/2011   Procedure: EXAM UNDER ANESTHESIA;  Surgeon:  Klein, MD;  Location: [facility];  Service: General;  Laterality: N/A;  . HEMICOLECTOMY  06/06/2005   left  . INCISION AND DRAINAGE PERIRECTAL ABSCESS  03/30/2005  . INCISION AND DRAINAGE PERIRECTAL ABSCESS  12/13/2004  . INCISION AND DRAINAGE PERIRECTAL ABSCESS  07/14/2001  . INCISION AND DRAINAGE PERIRECTAL ABSCESS  08/12/2010   horseshoe abscess; Dr Redmond Pulling  . INCISION AND DRAINAGE PERIRECTAL ABSCESS  06/06/2011   Procedure: IRRIGATION AND DEBRIDEMENT PERIRECTAL ABSCESS;  Surgeon:  Klein, MD;  Location: [facility];  Service: General;  Laterality: N/A;  . INCISION AND DRAINAGE PERIRECTAL ABSCESS N/A 02/24/2017   Procedure: IRRIGATION AND DEBRIDEMENT PERIRECTAL ABSCESS;  Surgeon: Ralene Ok, MD;  Location: [facility];  Service: General;  Laterality: N/A;  . IR CATHETER TUBE CHANGE  02/19/2018  . IR CHOLANGIOGRAM EXISTING TUBE  12/23/2017  . IR CHOLANGIOGRAM EXISTING TUBE  01/02/2018  . IR EXCHANGE BILIARY DRAIN  11/07/2017  . IR EXCHANGE BILIARY DRAIN  12/09/2017  . IR EXCHANGE BILIARY DRAIN  02/03/2018  . IR EXCHANGE BILIARY DRAIN  02/03/2018  .  IR IMAGING GUIDED PORT INSERTION  12/09/2017  . IR INT EXT BILIARY DRAIN WITH CHOLANGIOGRAM  10/26/2017  . IR INT EXT BILIARY DRAIN WITH CHOLANGIOGRAM  12/23/2017  . IR PATIENT EVAL TECH 0-60 MINS  12/02/2017  . IR RADIOLOGIST EVAL & MGMT  11/06/2017  . IR RADIOLOGIST EVAL & MGMT  01/05/2018  . [place]  . PERCUTANEOUS PINNING PHALANX FRACTURE OF HAND  ~ 2008   "plates in 2 places"; right  . PLACEMENT OF SETON N/A 09/05/2017   Procedure: PLACEMENT OF SETON;  Surgeon: Leighton Ruff, MD;  Location:  [facility];  Service: General;  Laterality: N/A;  . RADIOLOGY WITH ANESTHESIA N/A 10/25/2017   Procedure: MRI WITH ANESTHESIA;  Surgeon: Radiologist, Medication, MD;  Location: [facility];  Service: Radiology;  Laterality: N/A;  . THORACIC DISCECTOMY  1990's    Prior to Admission medications   Medication Sig Start Date End Date Taking? Authorizing Provider  aspirin EC 81 MG tablet Take 81 mg by mouth daily.   Yes [provider]  Ca Carbonate-Mag Hydroxide (ROLAIDS PO) Take 1-2 tablets by mouth 2 (two) times daily as needed (heartburn).   Yes [provider]  celecoxib (CELEBREX) 200 MG capsule Take 200 mg by mouth at bedtime.  10/10/17  Yes [provider]  fexofenadine (ALLEGRA) 180 MG tablet Take 180 mg by mouth at bedtime.   Yes [provider]  gabapentin (NEURONTIN) 400 MG capsule Take 400-800 mg by mouth See admin instructions. Take 400 mg by mouth in the morning and 800 mg in the evening   Yes [provider]  insulin detemir (LEVEMIR) 100 UNIT/ML injection Inject 10-62 Units into the skin See admin instructions. Take 62 in the am and 10 in the pm   Yes [provider]  lactose free nutrition (BOOST PLUS) LIQD Take 237 mLs by mouth 3 (three) times daily with meals. Patient taking differently: Take 237 mLs by mouth 3 (three) times daily with meals. Ensure chocolate and vanilla max protein 12/11/17  Yes Regalado, Belkys A, MD  lidocaine-prilocaine (EMLA) cream Apply to port site 1 hour prior to access Patient taking differently: Apply 1 application topically once. Apply to port site 1 hour prior to access 01/20/18  Yes [name], MD  Multiple Vitamin (MULTIVITAMIN) tablet Take 1 tablet by mouth daily.   Yes [provider]  tamsulosin (FLOMAX) 0.4 MG CAPS capsule TAKE 1 CAPSULE(0.4 MG) BY MOUTH DAILY AFTER SUPPER Patient taking differently: Take 0.4 mg by mouth daily after supper.  02/05/18  Yes [name], MD     Current Facility-Administered Medications  Medication Dose Route Frequency Provider Last Rate Last Dose  . ceFEPIme (MAXIPIME) 2 g in sodium chloride 0.9 % 100 mL IVPB  2 g Intravenous Q12H Franky Macho, RPH      . fentaNYL (SUBLIMAZE) injection 50 mcg  50 mcg Intravenous Once Ward, Kristen N, DO      . metroNIDAZOLE (FLAGYL) IVPB 500 mg  500 mg Intravenous Q8H Ward, Kristen N, DO      . ondansetron (ZOFRAN) tablet 4 mg  4 mg Oral Q6H PRN Reubin Milan, MD       Or  . ondansetron [facility]) injection 4 mg  4 mg Intravenous Q6H PRN Reubin Milan, MD      . pantoprazole (PROTONIX) 80 mg in sodium chloride 0.9 % 250 mL (0.32 mg/mL) infusion  8 mg/hr Intravenous Continuous Ward, Kristen N, DO 25 mL/hr  at 02/24/18 0625 8 mg/hr at 02/24/18 0625  . [START ON 02/27/2018] pantoprazole (PROTONIX) injection 40 mg  40 mg Intravenous Q12H Ward, Kristen N, DO      . sodium chloride flush (NS) 0.9 % injection 10-40 mL  10-40 mL Intracatheter PRN Ward, Delice Bison, DO      . vancomycin (VANCOCIN) 1,250 mg in sodium chloride 0.9 % 250 mL IVPB  1,250 mg Intravenous Q12H Franky Macho, [facility]       Current Outpatient Medications  Medication Sig Dispense Refill  . aspirin EC 81 MG tablet Take 81 mg by mouth daily.    . Ca Carbonate-Mag Hydroxide (ROLAIDS PO) Take 1-2 tablets by mouth 2 (two) times daily as needed (heartburn).    . celecoxib (CELEBREX) 200 MG capsule Take 200 mg by mouth at bedtime.   5  . fexofenadine (ALLEGRA) 180 MG tablet Take 180 mg by mouth at bedtime.    . gabapentin (NEURONTIN) 400 MG capsule Take 400-800 mg by mouth See admin instructions. Take 400 mg by mouth in the morning and 800 mg in the evening    . insulin detemir (LEVEMIR) 100 UNIT/ML injection Inject 10-62 Units into the skin See admin instructions. Take 62 in the am and 10 in the pm    . lactose free nutrition (BOOST PLUS) LIQD Take 237 mLs by mouth 3 (three) times daily with meals. (Patient taking differently: Take  237 mLs by mouth 3 (three) times daily with meals. Ensure chocolate and vanilla max protein) 30 Can 0  . lidocaine-prilocaine (EMLA) cream Apply to port site 1 hour prior to access (Patient taking differently: Apply 1 application topically once. Apply to port site 1 hour prior to access) 30 g 0  . Multiple Vitamin (MULTIVITAMIN) tablet Take 1 tablet by mouth daily.    . tamsulosin (FLOMAX) 0.4 MG CAPS capsule TAKE 1 CAPSULE(0.4 MG) BY MOUTH DAILY AFTER SUPPER (Patient taking differently: Take 0.4 mg by mouth daily after supper. ) 90 capsule 0    Allergies as of 02/24/2018 - Review Complete 02/24/2018  Allergen Reaction Noted  . Morphine and related Other (See Comments) 10/24/2017    Family History  Problem Relation Age of Onset  . Heart disease Father   . Heart disease Mother   . Cancer Mother   . Cancer Sister   . Heart disease Brother     Social History   Socioeconomic History  . Marital status: Married    Spouse name: Not on file  . Number of children: Not on file  . Years of education: Not on file  . Highest education level: Not on file  Occupational History  . Occupation: works at [employer]  . Financial resource strain: Not on file  . Food insecurity:    Worry: Not on file    Inability: Not on file  . Transportation needs:    Medical: Not on file    Non-medical: Not on file  Tobacco Use  . Smoking status: Former Smoker    Packs/day: 1.00    Years: 10.00    Pack years: 10.00    Types: Cigarettes    Last attempt to quit: 01/08/1991    Years since quitting: 27.1  . Smokeless tobacco: Never Used  Substance and Sexual Activity  . Alcohol use: Yes    Comment: 06/05/11 "drank enough when I was younger to last me my whole life; don't remember when I had my last drink, maybe 2012"  .  Drug use: Not Currently    Types: Oxycodone, Marijuana    Comment: 06/05/11 "pot head in my younger days; 2009 dr had me on oxycodone and valium for almost 8yr  . Sexual  activity: Not Currently  Lifestyle  . Physical activity:    Days per week: Not on file    Minutes per session: Not on file  . Stress: To some extent  Relationships  . Social connections:    Talks on phone: Not on file    Gets together: Not on file    Attends religious service: Not on file    Active member of club or organization: Not on file    Attends meetings of clubs or organizations: Not on file    Relationship status: Not on file  . Intimate partner violence:    Fear of current or ex partner: Not on file    Emotionally abused: Not on file    Physically abused: Not on file    Forced sexual activity: Not on file  Other Topics Concern  . Not on file  Social History Narrative  . Not on file    Review of Systems: Gen: Denies any fever, sweats or chills. CV: Denies chest pain, angina, palpitations, syncope, orthopnea, PND, peripheral edema, and claudication. Resp: Denies dyspnea at rest, dyspnea with exercise, cough, sputum, wheezing, coughing up blood, and pleurisy. GI: See HPI GU : Denies urinary burning, blood in urine, urinary frequency, urinary hesitancy, nocturnal urination, and urinary incontinence. Heme: Denies bruising, bleeding, and enlarged lymph nodes. Neuro:  See HPI.  Physical Exam: Vital signs in last 24 hours: Temp:  [97.6 F (36.4 C)-98.5 F (36.9 C)] 98 F (36.7 C) (02/18 0937) Pulse Rate:  [105-129] 112 (02/18 0937) Resp:  [13-23] 15 (02/18 0937) BP: (86-113)/(57-87) 98/60 (02/18 0937) SpO2:  [94 %-100 %] 100 % (02/18 0937) Weight:  [90.7 kg] 90.7 kg (02/18 0206)   General:   Alert,  Well-developed, well-nourished, pleasant and cooperative in NAD Head:  Normocephalic and atraumatic. Eyes:  Sclera nonicteric, conjunctiva pink Lungs:  Clear throughout.  Heart:  RRR, no murmurs Abdomen:  Soft, biliary drain drsg/tubing and bag intact, draining thin bilious drainage, surrounding drsg site tender to palpation, abdomen is soft, mildly distended, tympanic  to percussion, + BS x 4 quads.  Rectal:  Stool heme - per ED physician Pulses:  Normal pulses noted. Extremities: no edema Neurologic:  Alert and oriented x 3, no focal deficits Skin:  Intact without significant lesions or rashes, no jaundice Psych:  Alert and cooperative. Normal mood and affect.  Intake/Output from previous day: 02/17 0701 - 02/18 0700 In: 945 [Blood:945] Out: 800 [Urine:400; Emesis/NG output:400] Intake/Output this shift: Total I/O In: 815 [Blood:315; IV Piggyback:500] Out: -   Lab Results: Recent Labs    02/24/18 0245 02/24/18 0309  WBC 37.1*  --   HGB 5.5* 5.8*  HCT 19.3* 17.0*  PLT 210  --    BMET Recent Labs    02/24/18 0245 02/24/18 0309  NA 133* 135  K 4.2 4.3  CL 106  --   CO2 14*  --   GLUCOSE 193*  --   BUN 28*  --   CREATININE 0.90  --   CALCIUM 8.4*  --    LFT Recent Labs    02/24/18 0245  PROT 5.7*  ALBUMIN 2.3*  AST 42*  ALT 41  ALKPHOS 306*  BILITOT 1.0   PT/INR Recent Labs    02/24/18 0245  LABPROT 16.1*  INR 1.31   Studies/Results: Ct Head Wo Contrast  Result Date: 02/24/2018 CLINICAL DATA:  Initial evaluation for acute posttraumatic headache. EXAM: CT HEAD WITHOUT CONTRAST TECHNIQUE: Contiguous axial images were obtained from the base of the skull through the vertex without intravenous contrast. COMPARISON:  Prior CT from 10/09/2010 FINDINGS: Brain: Cerebral volume within normal limits for patient age. No evidence for acute intracranial hemorrhage. No findings to suggest acute large vessel territory infarct. No mass lesion, midline shift, or mass effect. Ventricles are normal in size without evidence for hydrocephalus. No extra-axial fluid collection identified. Vascular: No hyperdense vessel identified. Skull: Scalp soft tissues demonstrate no acute abnormality. Calvarium intact. Sinuses/Orbits: Globes and orbital soft tissues within normal limits. Visualized paranasal sinuses are clear. No mastoid effusion.  IMPRESSION: Negative head CT.  No acute intracranial abnormality. Electronically Signed   By: Jeannine Boga M.D.   On: 02/24/2018 05:52   Dg Chest Port 1 View  Result Date: 02/24/2018 CLINICAL DATA:  63 year old male with hypotension, possible seizure. Metastatic cholangiocarcinoma. EXAM: PORTABLE CHEST 1 VIEW COMPARISON:  Chest CT 02/19/2018. 12/03/2017 radiographs and earlier. FINDINGS: Portable AP semi upright view at 0220 hours. Right chest power port now in place. Mediastinal contours remain normal. Left lung base pulmonary nodule is subtle radiographically. Other lung markings appear stable. No pneumothorax, pulmonary edema, pleural effusion or new pulmonary opacity. Partially visible biliary drains. IMPRESSION: No new cardiopulmonary abnormality. Left lower lobe pulmonary metastasis better demonstrated by CT. Electronically Signed   By: Genevie Ann M.D.   On: 02/24/2018 03:40   Dg Shoulder Left  Result Date: 02/24/2018 CLINICAL DATA:  Left shoulder pain since a fall last night. Initial encounter. EXAM: LEFT SHOULDER - 2+ VIEW COMPARISON:  None. FINDINGS: There is no acute bony or joint abnormality. Remote healed distal clavicle fracture is noted. Acromioclavicular osteoarthritis is seen. Soft tissues are unremarkable. IMPRESSION: No acute finding. Remote healed distal clavicle fracture. Acromioclavicular osteoarthritis. Electronically Signed   By: Inge Rise M.D.   On: 02/24/2018 08:47    IMPRESSION/PLAN 1) Anemia secondary to chemotherapy and UGI bleeding. History of grade I esophageal varices, gastritis and duodenal ulcers. Patient had 1 episode of coffee ground emesis heme + in the ED. Hg 5.5. Hct 19.3. 3 units of PRBCs infused, repeat H/H pending.  -PRBC infusion per hospitalis, follow H/H -Hold ASA, no NSAIDs -NPO for now -will need EGD when stable,  further recommendations per Dr. Fuller Plan  2)  Intrahepatic cholangiocarcinoma with metastasis to the right adrenal gland, liver,  lungs and nodes diagnosed 10/2017. Obstructive jaundice required internal/external biliary drains, multiple drain exchanges. Biliary drain currently patent. T.bili 1.0. Alk Phos 306. AST 42. ALT 41.   3) Leukocytosis, concerning for cholangitis, ? SBP. Unlikely biliary obstruction as biliary drain is draining a moderate amount of bilious drainage and LFTs stable. -agree with antibiotics per hospitalist -await blood culture results -repeat abd/pelvic CT to be determined by Dr. Fuller Plan -may need paracentesis to rule out SBP  -follow CBC with diff, monitor hepatic panel  3) Alcoholic cirrhosis with new ascites per abd/pelvic CT 02/19/2018 -may need paracentesis  rule out SBP, to be determined by Dr. Nehemiah Settle Dorathy Daft  02/24/2018, 10:01 AM     Attending Physician Note   I have taken a history, examined the patient and reviewed the chart. I agree with the Advanced Practitioner's note, impression and recommendations.  UGI bleed likely from ulcer, varices, gastropathy. Cirrhosis with ascites.   ABL anemia.  Metastatic cholangiocarcinoma.  EGD tomorrow Transfuse to Hb > 7 IV PPI and octreotide infusions  IV Maxipime Paracentesis to R/O SBP Consider repeat abd/pelvic CT  Lucio Edward, MD [facility] [phone]

## 2018-02-24 NOTE — ED Notes (Signed)
Pt flushed his biliary drain lines with NS flush per his home regiment.

## 2018-02-24 NOTE — ED Notes (Signed)
Obtained consent for EGD. Pt may have clear liquid diet per verbal order from GI MD. Pt will have EGD tomorrow. NPO tonight.

## 2018-02-24 NOTE — H&P (View-Only) (Signed)
Referring Provider: No ref. provider found Primary Care Physician:  [name], MD Primary Gastroenterologist:  Dr. Silverio Decamp   Reason for Consultation: Intrahepatic cholangiocarcinoma with sepsis and profound anemia  HPI: Matthew Lewis is a 63 y.o. male with a history of alcoholic cirrhosis, hypertension, diabetes mellitus type 2, diabetic neuropathy, PTSD, anal/rectal abscesses, colon cancer s/p sigmoid resection in 2007, splenic vein thrombosis and hepatic cholangiocarcinoma initially diagnosed 11/06/2017 with metastasis to the liver, lung and right adrenal gland who presented to the emergency department this morning after feeling dizzy and fell at home earlier today. He "blacked out" briefly and crawled to the bathroom. He was incontinent of urine then stood up and fell against the wall, vision was impaired briefly. He had 2 seizures as witnessed by his wife and paramedics. No prior history of a seizure disorder. He reported having nausea and overall did not feel well since yesterday. He vomited coffee ground emesis while in the ED. He denies having any upper or lower abdominal pain. Infrequent heartburn relieved by taking TUMs. Passing a normal brown BM daily. No melena or rectal bleeding. He takes ASA 28m once daily and Celebrex 2024monce daily.    ED course: WBC 37.1. Hemoglobin 5.5.  Hematocrit 19.3.  MCV 103.8.  Platelet 210.  ANC 31.5.  Sodium 133.  Potassium 4.2.  Glucose 193.  BUN 28.  Creatinine 0.90.  Anion gap 13.  Ionized calcium 1.05.  Alk phos 306.  Albumin 2.3.  AST 42.  ALT 41.  Total bili 1.0.  INR 1.1.  PT 14.2. PRBC transfusion x 3 ordered. Stool Hemoccult negative. He vomited coffee ground emesis in the ER. Emesis was hemoccult positive.  IV Protonix bolus and drip initiated in the ED. CT of the head was negative.  Intrahepatic cholangiocarcinoma with metastasis summary: He developed obstructive jaundice 10/2017. He was seen by his PCP and LFTs were grossly  elevated. He underwent multiple imaging studies as reported below. Soft tissue core biopsy of the right adrenal mass positive for adenocarcinoma with metastasis. He is followed by oncologist, Dr. YaTruitt [name] Initial chemotherapy treatment gemcitabine/cisplatin on days 1 and 8 every 21 days started 12/18/2017. Repeat CT showed growing adrenal metastasis with stable liver metastasis, Dr. FeBurr Medicoiscussed changing chemo to Folfox. He was scheduled to start Folfox today.  Review of image studies:  He underwent an abdominal sonogram 10/24/2018 which identified a cirrhotic appearance of the liver, a 2.6 cm ill-defined mass in segment 6 of the liver.  Splenomegaly. Apparent thickening of the gallbladder wall without cholelithiasis.  An abdominal MRI/MRCP 10/26/2018 which identified advanced cirrhosis, multifocal enhancing liver lesions, A lesion obstructs the right medial hepatic duct and left main duct resulting in moderate to market biliary ductal dilatation within the left lobe of the liver.  A tumor thrombus with a left branch of the portal vein.  Bilateral adrenal metastasis and retroperitoneal nodal metastasis.  Stigmata poor portal venous hypertension including varices, ascites and splenomegaly.  He underwent numerous abd/pelvic CTs since 10/2017. His most recent image studies as follows: Chest/Abdominal/Pelvic CT 02/19/2018 showed a infiltrative mass within the left hepatic lobe which was grossly similar when compared to prior exam, additional lesions within the liver were grossly similar.  Biliary drain within the left hepatic lobe with resultant decompression of the left hepatic lobe biliary system.  Similar-appearing sharply marginated cons consolidation within the right lower lobe which may represent infectious/inflammatory process or metastatic disease.  Large amount of stool throughout the colon.  Persistent  left hepatic lobe portal venous thrombosis.  Splenomegaly.  New moderate volume  ascites  10/26/2017 internal/external biliary drain with cholangiogram by IR. 11/07/2017, 12/09/2017 internal/external biliary drain replacement of internal/external biliary drain by IR. Repeat CT 12/23/2017  showed a persistent obstruction of the left biliary system, right anterior system poorly drained, placement of a left sided biliary drain in addition to the existing right biliary drain. 02/03/2018 internal/external biliary drain exchange.  02/19/2018 internal/external biliary exchange and resposition left internal/external biliary drain.  11/04/2017 colonoscopy: Seton in place, congested mucosa in the rectum, in the sigmoid colon and in the descending colon, a few diminutive polyps in the rectum and in the sigmoid colon.  No colon or rectal mass identified.  06/19/2016 EGD identified nonbleeding grade 1 esophageal varices, gastritis with hemorrhage, multiple nonbleeding duodenal ulcers without stigmata of bleeding.  Repeat surveillance EGD in 2 years recommended.   Past Medical History:  Diagnosis Date  . Abscess of anal and rectal regions    horse shoe abscess  . Alcoholic cirrhosis ([place])   . Arthritis    "everywhere; especially in my spine"  . Cirrhosis ([place])    STAGE 1 CIRRHOSIS PATIENT SEES DR NAMDIGAM FOR  . Colon cancer ([place]) 05/2005   TX SURGERY WITH LYMPH NODE REMOVAL  . Cough    LAST FEW WEEKS SAW DR RAMOS LUNGS MILKY SPUTUM OCC  . DM type 2 with diabetic peripheral neuropathy ([facility])    left foot  . Fatigue   . Fistula, anal    multiple  . History of substance abuse ([place])    last alcohol was 08/2011; last marijuania was 08/2011  . Hypertension   . PTSD (post-traumatic stress disorder)    "severe" HX OF  . Sleep deprivation     Past Surgical History:  Procedure Laterality Date  . ABSCESS DRAINAGE     "probably 15-20 so far at least; rectal"  . ANAL FISTULECTOMY  12/13/1998  . ANTERIOR FUSION CERVICAL SPINE  2010   "triple"; "did 2 surgeries on the same day in  3295 due to complications"  . CORONARY ANGIOPLASTY  2001  . detached muscle  2010   right chest; "after cervical fusion complications"  . ELBOW SURGERY  2007   "cut out part of a muscle"; left  . EXAMINATION UNDER ANESTHESIA  03/22/2005   fistula  . EXAMINATION UNDER ANESTHESIA  06/06/2011   Procedure: EXAM UNDER ANESTHESIA;  Surgeon:  Klein, MD;  Location: [facility];  Service: General;  Laterality: N/A;  . HEMICOLECTOMY  06/06/2005   left  . INCISION AND DRAINAGE PERIRECTAL ABSCESS  03/30/2005  . INCISION AND DRAINAGE PERIRECTAL ABSCESS  12/13/2004  . INCISION AND DRAINAGE PERIRECTAL ABSCESS  07/14/2001  . INCISION AND DRAINAGE PERIRECTAL ABSCESS  08/12/2010   horseshoe abscess; Dr Redmond Pulling  . INCISION AND DRAINAGE PERIRECTAL ABSCESS  06/06/2011   Procedure: IRRIGATION AND DEBRIDEMENT PERIRECTAL ABSCESS;  Surgeon:  Klein, MD;  Location: [facility];  Service: General;  Laterality: N/A;  . INCISION AND DRAINAGE PERIRECTAL ABSCESS N/A 02/24/2017   Procedure: IRRIGATION AND DEBRIDEMENT PERIRECTAL ABSCESS;  Surgeon: Ralene Ok, MD;  Location: [facility];  Service: General;  Laterality: N/A;  . IR CATHETER TUBE CHANGE  02/19/2018  . IR CHOLANGIOGRAM EXISTING TUBE  12/23/2017  . IR CHOLANGIOGRAM EXISTING TUBE  01/02/2018  . IR EXCHANGE BILIARY DRAIN  11/07/2017  . IR EXCHANGE BILIARY DRAIN  12/09/2017  . IR EXCHANGE BILIARY DRAIN  02/03/2018  . IR EXCHANGE BILIARY DRAIN  02/03/2018  .  IR IMAGING GUIDED PORT INSERTION  12/09/2017  . IR INT EXT BILIARY DRAIN WITH CHOLANGIOGRAM  10/26/2017  . IR INT EXT BILIARY DRAIN WITH CHOLANGIOGRAM  12/23/2017  . IR PATIENT EVAL TECH 0-60 MINS  12/02/2017  . IR RADIOLOGIST EVAL & MGMT  11/06/2017  . IR RADIOLOGIST EVAL & MGMT  01/05/2018  . [place]  . PERCUTANEOUS PINNING PHALANX FRACTURE OF HAND  ~ 2008   "plates in 2 places"; right  . PLACEMENT OF SETON N/A 09/05/2017   Procedure: PLACEMENT OF SETON;  Surgeon: Leighton Ruff, MD;  Location:  [facility];  Service: General;  Laterality: N/A;  . RADIOLOGY WITH ANESTHESIA N/A 10/25/2017   Procedure: MRI WITH ANESTHESIA;  Surgeon: Radiologist, Medication, MD;  Location: [facility];  Service: Radiology;  Laterality: N/A;  . THORACIC DISCECTOMY  1990's    Prior to Admission medications   Medication Sig Start Date End Date Taking? Authorizing Provider  aspirin EC 81 MG tablet Take 81 mg by mouth daily.   Yes [provider]  Ca Carbonate-Mag Hydroxide (ROLAIDS PO) Take 1-2 tablets by mouth 2 (two) times daily as needed (heartburn).   Yes [provider]  celecoxib (CELEBREX) 200 MG capsule Take 200 mg by mouth at bedtime.  10/10/17  Yes [provider]  fexofenadine (ALLEGRA) 180 MG tablet Take 180 mg by mouth at bedtime.   Yes [provider]  gabapentin (NEURONTIN) 400 MG capsule Take 400-800 mg by mouth See admin instructions. Take 400 mg by mouth in the morning and 800 mg in the evening   Yes [provider]  insulin detemir (LEVEMIR) 100 UNIT/ML injection Inject 10-62 Units into the skin See admin instructions. Take 62 in the am and 10 in the pm   Yes [provider]  lactose free nutrition (BOOST PLUS) LIQD Take 237 mLs by mouth 3 (three) times daily with meals. Patient taking differently: Take 237 mLs by mouth 3 (three) times daily with meals. Ensure chocolate and vanilla max protein 12/11/17  Yes Regalado, Belkys A, MD  lidocaine-prilocaine (EMLA) cream Apply to port site 1 hour prior to access Patient taking differently: Apply 1 application topically once. Apply to port site 1 hour prior to access 01/20/18  Yes [name], MD  Multiple Vitamin (MULTIVITAMIN) tablet Take 1 tablet by mouth daily.   Yes [provider]  tamsulosin (FLOMAX) 0.4 MG CAPS capsule TAKE 1 CAPSULE(0.4 MG) BY MOUTH DAILY AFTER SUPPER Patient taking differently: Take 0.4 mg by mouth daily after supper.  02/05/18  Yes [name], MD     Current Facility-Administered Medications  Medication Dose Route Frequency Provider Last Rate Last Dose  . ceFEPIme (MAXIPIME) 2 g in sodium chloride 0.9 % 100 mL IVPB  2 g Intravenous Q12H Franky Macho, RPH      . fentaNYL (SUBLIMAZE) injection 50 mcg  50 mcg Intravenous Once Ward, Kristen N, DO      . metroNIDAZOLE (FLAGYL) IVPB 500 mg  500 mg Intravenous Q8H Ward, Kristen N, DO      . ondansetron (ZOFRAN) tablet 4 mg  4 mg Oral Q6H PRN Reubin Milan, MD       Or  . ondansetron [facility]) injection 4 mg  4 mg Intravenous Q6H PRN Reubin Milan, MD      . pantoprazole (PROTONIX) 80 mg in sodium chloride 0.9 % 250 mL (0.32 mg/mL) infusion  8 mg/hr Intravenous Continuous Ward, Kristen N, DO 25 mL/hr  at 02/24/18 0625 8 mg/hr at 02/24/18 0625  . [START ON 02/27/2018] pantoprazole (PROTONIX) injection 40 mg  40 mg Intravenous Q12H Ward, Kristen N, DO      . sodium chloride flush (NS) 0.9 % injection 10-40 mL  10-40 mL Intracatheter PRN Ward, Delice Bison, DO      . vancomycin (VANCOCIN) 1,250 mg in sodium chloride 0.9 % 250 mL IVPB  1,250 mg Intravenous Q12H Franky Macho, [facility]       Current Outpatient Medications  Medication Sig Dispense Refill  . aspirin EC 81 MG tablet Take 81 mg by mouth daily.    . Ca Carbonate-Mag Hydroxide (ROLAIDS PO) Take 1-2 tablets by mouth 2 (two) times daily as needed (heartburn).    . celecoxib (CELEBREX) 200 MG capsule Take 200 mg by mouth at bedtime.   5  . fexofenadine (ALLEGRA) 180 MG tablet Take 180 mg by mouth at bedtime.    . gabapentin (NEURONTIN) 400 MG capsule Take 400-800 mg by mouth See admin instructions. Take 400 mg by mouth in the morning and 800 mg in the evening    . insulin detemir (LEVEMIR) 100 UNIT/ML injection Inject 10-62 Units into the skin See admin instructions. Take 62 in the am and 10 in the pm    . lactose free nutrition (BOOST PLUS) LIQD Take 237 mLs by mouth 3 (three) times daily with meals. (Patient taking differently: Take  237 mLs by mouth 3 (three) times daily with meals. Ensure chocolate and vanilla max protein) 30 Can 0  . lidocaine-prilocaine (EMLA) cream Apply to port site 1 hour prior to access (Patient taking differently: Apply 1 application topically once. Apply to port site 1 hour prior to access) 30 g 0  . Multiple Vitamin (MULTIVITAMIN) tablet Take 1 tablet by mouth daily.    . tamsulosin (FLOMAX) 0.4 MG CAPS capsule TAKE 1 CAPSULE(0.4 MG) BY MOUTH DAILY AFTER SUPPER (Patient taking differently: Take 0.4 mg by mouth daily after supper. ) 90 capsule 0    Allergies as of 02/24/2018 - Review Complete 02/24/2018  Allergen Reaction Noted  . Morphine and related Other (See Comments) 10/24/2017    Family History  Problem Relation Age of Onset  . Heart disease Father   . Heart disease Mother   . Cancer Mother   . Cancer Sister   . Heart disease Brother     Social History   Socioeconomic History  . Marital status: Married    Spouse name: Not on file  . Number of children: Not on file  . Years of education: Not on file  . Highest education level: Not on file  Occupational History  . Occupation: works at [employer]  . Financial resource strain: Not on file  . Food insecurity:    Worry: Not on file    Inability: Not on file  . Transportation needs:    Medical: Not on file    Non-medical: Not on file  Tobacco Use  . Smoking status: Former Smoker    Packs/day: 1.00    Years: 10.00    Pack years: 10.00    Types: Cigarettes    Last attempt to quit: 01/08/1991    Years since quitting: 27.1  . Smokeless tobacco: Never Used  Substance and Sexual Activity  . Alcohol use: Yes    Comment: 06/05/11 "drank enough when I was younger to last me my whole life; don't remember when I had my last drink, maybe 2012"  .  Drug use: Not Currently    Types: Oxycodone, Marijuana    Comment: 06/05/11 "pot head in my younger days; 2009 dr had me on oxycodone and valium for almost 45yr  . Sexual  activity: Not Currently  Lifestyle  . Physical activity:    Days per week: Not on file    Minutes per session: Not on file  . Stress: To some extent  Relationships  . Social connections:    Talks on phone: Not on file    Gets together: Not on file    Attends religious service: Not on file    Active member of club or organization: Not on file    Attends meetings of clubs or organizations: Not on file    Relationship status: Not on file  . Intimate partner violence:    Fear of current or ex partner: Not on file    Emotionally abused: Not on file    Physically abused: Not on file    Forced sexual activity: Not on file  Other Topics Concern  . Not on file  Social History Narrative  . Not on file    Review of Systems: Gen: Denies any fever, sweats or chills. CV: Denies chest pain, angina, palpitations, syncope, orthopnea, PND, peripheral edema, and claudication. Resp: Denies dyspnea at rest, dyspnea with exercise, cough, sputum, wheezing, coughing up blood, and pleurisy. GI: See HPI GU : Denies urinary burning, blood in urine, urinary frequency, urinary hesitancy, nocturnal urination, and urinary incontinence. Heme: Denies bruising, bleeding, and enlarged lymph nodes. Neuro:  See HPI.  Physical Exam: Vital signs in last 24 hours: Temp:  [97.6 F (36.4 C)-98.5 F (36.9 C)] 98 F (36.7 C) (02/18 0937) Pulse Rate:  [105-129] 112 (02/18 0937) Resp:  [13-23] 15 (02/18 0937) BP: (86-113)/(57-87) 98/60 (02/18 0937) SpO2:  [94 %-100 %] 100 % (02/18 0937) Weight:  [90.7 kg] 90.7 kg (02/18 0206)   General:   Alert,  Well-developed, well-nourished, pleasant and cooperative in NAD Head:  Normocephalic and atraumatic. Eyes:  Sclera nonicteric, conjunctiva pink Lungs:  Clear throughout.  Heart:  RRR, no murmurs Abdomen:  Soft, biliary drain drsg/tubing and bag intact, draining thin bilious drainage, surrounding drsg site tender to palpation, abdomen is soft, mildly distended, tympanic  to percussion, + BS x 4 quads.  Rectal:  Stool heme - per ED physician Pulses:  Normal pulses noted. Extremities: no edema Neurologic:  Alert and oriented x 3, no focal deficits Skin:  Intact without significant lesions or rashes, no jaundice Psych:  Alert and cooperative. Normal mood and affect.  Intake/Output from previous day: 02/17 0701 - 02/18 0700 In: 945 [Blood:945] Out: 800 [Urine:400; Emesis/NG output:400] Intake/Output this shift: Total I/O In: 815 [Blood:315; IV Piggyback:500] Out: -   Lab Results: Recent Labs    02/24/18 0245 02/24/18 0309  WBC 37.1*  --   HGB 5.5* 5.8*  HCT 19.3* 17.0*  PLT 210  --    BMET Recent Labs    02/24/18 0245 02/24/18 0309  NA 133* 135  K 4.2 4.3  CL 106  --   CO2 14*  --   GLUCOSE 193*  --   BUN 28*  --   CREATININE 0.90  --   CALCIUM 8.4*  --    LFT Recent Labs    02/24/18 0245  PROT 5.7*  ALBUMIN 2.3*  AST 42*  ALT 41  ALKPHOS 306*  BILITOT 1.0   PT/INR Recent Labs    02/24/18 0245  LABPROT 16.1*  INR 1.31   Studies/Results: Ct Head Wo Contrast  Result Date: 02/24/2018 CLINICAL DATA:  Initial evaluation for acute posttraumatic headache. EXAM: CT HEAD WITHOUT CONTRAST TECHNIQUE: Contiguous axial images were obtained from the base of the skull through the vertex without intravenous contrast. COMPARISON:  Prior CT from 10/09/2010 FINDINGS: Brain: Cerebral volume within normal limits for patient age. No evidence for acute intracranial hemorrhage. No findings to suggest acute large vessel territory infarct. No mass lesion, midline shift, or mass effect. Ventricles are normal in size without evidence for hydrocephalus. No extra-axial fluid collection identified. Vascular: No hyperdense vessel identified. Skull: Scalp soft tissues demonstrate no acute abnormality. Calvarium intact. Sinuses/Orbits: Globes and orbital soft tissues within normal limits. Visualized paranasal sinuses are clear. No mastoid effusion.  IMPRESSION: Negative head CT.  No acute intracranial abnormality. Electronically Signed   By: Jeannine Boga M.D.   On: 02/24/2018 05:52   Dg Chest Port 1 View  Result Date: 02/24/2018 CLINICAL DATA:  63 year old male with hypotension, possible seizure. Metastatic cholangiocarcinoma. EXAM: PORTABLE CHEST 1 VIEW COMPARISON:  Chest CT 02/19/2018. 12/03/2017 radiographs and earlier. FINDINGS: Portable AP semi upright view at 0220 hours. Right chest power port now in place. Mediastinal contours remain normal. Left lung base pulmonary nodule is subtle radiographically. Other lung markings appear stable. No pneumothorax, pulmonary edema, pleural effusion or new pulmonary opacity. Partially visible biliary drains. IMPRESSION: No new cardiopulmonary abnormality. Left lower lobe pulmonary metastasis better demonstrated by CT. Electronically Signed   By: Genevie Ann M.D.   On: 02/24/2018 03:40   Dg Shoulder Left  Result Date: 02/24/2018 CLINICAL DATA:  Left shoulder pain since a fall last night. Initial encounter. EXAM: LEFT SHOULDER - 2+ VIEW COMPARISON:  None. FINDINGS: There is no acute bony or joint abnormality. Remote healed distal clavicle fracture is noted. Acromioclavicular osteoarthritis is seen. Soft tissues are unremarkable. IMPRESSION: No acute finding. Remote healed distal clavicle fracture. Acromioclavicular osteoarthritis. Electronically Signed   By: Inge Rise M.D.   On: 02/24/2018 08:47    IMPRESSION/PLAN 1) Anemia secondary to chemotherapy and UGI bleeding. History of grade I esophageal varices, gastritis and duodenal ulcers. Patient had 1 episode of coffee ground emesis heme + in the ED. Hg 5.5. Hct 19.3. 3 units of PRBCs infused, repeat H/H pending.  -PRBC infusion per hospitalis, follow H/H -Hold ASA, no NSAIDs -NPO for now -will need EGD when stable,  further recommendations per Dr. Fuller Plan  2)  Intrahepatic cholangiocarcinoma with metastasis to the right adrenal gland, liver,  lungs and nodes diagnosed 10/2017. Obstructive jaundice required internal/external biliary drains, multiple drain exchanges. Biliary drain currently patent. T.bili 1.0. Alk Phos 306. AST 42. ALT 41.   3) Leukocytosis, concerning for cholangitis, ? SBP. Unlikely biliary obstruction as biliary drain is draining a moderate amount of bilious drainage and LFTs stable. -agree with antibiotics per hospitalist -await blood culture results -repeat abd/pelvic CT to be determined by Dr. Fuller Plan -may need paracentesis to rule out SBP  -follow CBC with diff, monitor hepatic panel  3) Alcoholic cirrhosis with new ascites per abd/pelvic CT 02/19/2018 -may need paracentesis  rule out SBP, to be determined by Dr. Nehemiah Settle Dorathy Daft  02/24/2018, 10:01 AM     Attending Physician Note   I have taken a history, examined the patient and reviewed the chart. I agree with the Advanced Practitioner's note, impression and recommendations.  UGI bleed likely from ulcer, varices, gastropathy. Cirrhosis with ascites.   ABL anemia.  Metastatic cholangiocarcinoma.  EGD tomorrow Transfuse to Hb > 7 IV PPI and octreotide infusions  IV Maxipime Paracentesis to R/O SBP Consider repeat abd/pelvic CT  Lucio Edward, MD [facility] [phone]

## 2018-02-25 ENCOUNTER — Inpatient Hospital Stay (HOSPITAL_COMMUNITY): Payer: Medicare Other | Admitting: Certified Registered Nurse Anesthetist

## 2018-02-25 ENCOUNTER — Encounter (HOSPITAL_COMMUNITY)
Admission: EM | Disposition: A | Discharge status: Home / Self Care | Payer: Self-pay | Source: Home / Self Care | Attending: Internal Medicine

## 2018-02-25 ENCOUNTER — Encounter (HOSPITAL_COMMUNITY): Payer: Self-pay

## 2018-02-25 ENCOUNTER — Inpatient Hospital Stay (HOSPITAL_COMMUNITY): Payer: Medicare Other

## 2018-02-25 DIAGNOSIS — K920 Hematemesis: Secondary | ICD-10-CM

## 2018-02-25 HISTORY — PX: ESOPHAGEAL BANDING: SHX5518

## 2018-02-25 HISTORY — PX: ESOPHAGOGASTRODUODENOSCOPY (EGD) WITH PROPOFOL: SHX5813

## 2018-02-25 HISTORY — PX: IR PARACENTESIS: IMG2679

## 2018-02-25 LAB — POCT I-STAT 4, (NA,K, GLUC, HGB,HCT)
Glucose, Bld: 191 mg/dL — ABNORMAL HIGH (ref 70–99)
HCT: 25 % — ABNORMAL LOW (ref 39.0–52.0)
Hemoglobin: 8.5 g/dL — ABNORMAL LOW (ref 13.0–17.0)
Potassium: 4.2 mmol/L (ref 3.5–5.1)
Sodium: 134 mmol/L — ABNORMAL LOW (ref 135–145)

## 2018-02-25 LAB — COMPREHENSIVE METABOLIC PANEL
ALT: 800 U/L — ABNORMAL HIGH (ref 0–44)
AST: 701 U/L — ABNORMAL HIGH (ref 15–41)
Albumin: 2.1 g/dL — ABNORMAL LOW (ref 3.5–5.0)
Alkaline Phosphatase: 293 U/L — ABNORMAL HIGH (ref 38–126)
Anion gap: 4 — ABNORMAL LOW (ref 5–15)
BUN: 18 mg/dL (ref 8–23)
CO2: 22 mmol/L (ref 22–32)
Calcium: 8 mg/dL — ABNORMAL LOW (ref 8.9–10.3)
Chloride: 107 mmol/L (ref 98–111)
Creatinine, Ser: 0.74 mg/dL (ref 0.61–1.24)
GFR calc Af Amer: >60 mL/min (ref >60)
GFR calc non Af Amer: >60 mL/min (ref >60)
Glucose, Bld: 186 mg/dL — ABNORMAL HIGH (ref 70–99)
Potassium: 4.1 mmol/L (ref 3.5–5.1)
Sodium: 133 mmol/L — ABNORMAL LOW (ref 135–145)
Total Bilirubin: 1.2 mg/dL (ref 0.3–1.2)
Total Protein: 5.1 g/dL — ABNORMAL LOW (ref 6.5–8.1)

## 2018-02-25 LAB — BODY FLUID CELL COUNT WITH DIFFERENTIAL
Eos, Fluid: 0 %
Lymphs, Fluid: 38 %
Monocyte-Macrophage-Serous Fluid: 40 % — ABNORMAL LOW (ref 50–90)
Neutrophil Count, Fluid: 22 % (ref 0–25)
Total Nucleated Cell Count, Fluid: 246 cu mm (ref 0–1000)

## 2018-02-25 LAB — GRAM STAIN

## 2018-02-25 LAB — GLUCOSE, CAPILLARY
GLUCOSE-CAPILLARY: 167 mg/dL — AB (ref 70–99)
Glucose-Capillary: 156 mg/dL — ABNORMAL HIGH (ref 70–99)
Glucose-Capillary: 176 mg/dL — ABNORMAL HIGH (ref 70–99)
Glucose-Capillary: 198 mg/dL — ABNORMAL HIGH (ref 70–99)

## 2018-02-25 LAB — CBC WITH DIFFERENTIAL/PLATELET
Abs Immature Granulocytes: 0.47 10*3/uL — ABNORMAL HIGH (ref 0.00–0.07)
Basophils Absolute: 0 10*3/uL (ref 0.0–0.1)
Basophils Relative: 0 %
EOS PCT: 1 %
Eosinophils Absolute: 0.2 10*3/uL (ref 0.0–0.5)
HCT: 21.5 % — ABNORMAL LOW (ref 39.0–52.0)
HEMOGLOBIN: 7 g/dL — AB (ref 13.0–17.0)
Immature Granulocytes: 3 %
LYMPHS PCT: 4 %
Lymphs Abs: 0.6 10*3/uL — ABNORMAL LOW (ref 0.7–4.0)
MCH: 30.4 pg (ref 26.0–34.0)
MCHC: 32.6 g/dL (ref 30.0–36.0)
MCV: 93.5 fL (ref 80.0–100.0)
Monocytes Absolute: 1.5 10*3/uL — ABNORMAL HIGH (ref 0.1–1.0)
Monocytes Relative: 8 %
Neutro Abs: 15.2 10*3/uL — ABNORMAL HIGH (ref 1.7–7.7)
Neutrophils Relative %: 84 %
Platelets: 116 10*3/uL — ABNORMAL LOW (ref 150–400)
RBC: 2.3 MIL/uL — ABNORMAL LOW (ref 4.22–5.81)
RDW: 18 % — ABNORMAL HIGH (ref 11.5–15.5)
WBC: 18.1 10*3/uL — ABNORMAL HIGH (ref 4.0–10.5)
nRBC: 0.2 % (ref 0.0–0.2)

## 2018-02-25 LAB — HEMOGLOBIN AND HEMATOCRIT, BLOOD
HCT: 26.4 % — ABNORMAL LOW (ref 39.0–52.0)
Hemoglobin: 8.6 g/dL — ABNORMAL LOW (ref 13.0–17.0)

## 2018-02-25 LAB — PREPARE RBC (CROSSMATCH)

## 2018-02-25 LAB — BLOOD PRODUCT ORDER (VERBAL) VERIFICATION

## 2018-02-25 SURGERY — ESOPHAGOGASTRODUODENOSCOPY (EGD) WITH PROPOFOL
Anesthesia: General

## 2018-02-25 SURGERY — ESOPHAGOGASTRODUODENOSCOPY (EGD) WITH PROPOFOL
Anesthesia: Monitor Anesthesia Care

## 2018-02-25 MED ORDER — PHENYLEPHRINE 40 MCG/ML (10ML) SYRINGE FOR IV PUSH (FOR BLOOD PRESSURE SUPPORT)
PREFILLED_SYRINGE | INTRAVENOUS | Status: DC | PRN
  Administered 2018-02-25: 120 ug via INTRAVENOUS
  Administered 2018-02-25: 80 ug via INTRAVENOUS
  Administered 2018-02-25: 120 ug via INTRAVENOUS
  Administered 2018-02-25: 80 ug via INTRAVENOUS

## 2018-02-25 MED ORDER — DIPHENHYDRAMINE HCL 50 MG/ML IJ SOLN: 25.0000 mg | Freq: Four times a day (QID) | INTRAMUSCULAR | Status: DC | PRN

## 2018-02-25 MED ORDER — LORAZEPAM 2 MG/ML IJ SOLN: 1.0000 mg | Freq: Once | INTRAMUSCULAR | Status: DC | PRN

## 2018-02-25 MED ORDER — ACETAMINOPHEN 325 MG PO TABS
650.0000 mg | ORAL_TABLET | Freq: Four times a day (QID) | ORAL | Status: DC | PRN
  Administered 2018-02-25: 650 mg via ORAL

## 2018-02-25 MED ORDER — EPHEDRINE SULFATE-NACL 50-0.9 MG/10ML-% IV SOSY
PREFILLED_SYRINGE | INTRAVENOUS | Status: DC | PRN
  Administered 2018-02-25 (×2): 10 mg via INTRAVENOUS

## 2018-02-25 MED ORDER — ACETAMINOPHEN 500 MG PO TABS
ORAL_TABLET | ORAL | Status: AC
  Filled 2018-02-25: qty 2

## 2018-02-25 MED ORDER — PROPOFOL 500 MG/50ML IV EMUL
INTRAVENOUS | Status: DC | PRN
  Administered 2018-02-25: 125 ug/kg/min via INTRAVENOUS

## 2018-02-25 MED ORDER — FUROSEMIDE 10 MG/ML IJ SOLN
40.0000 mg | Freq: Once | INTRAMUSCULAR | Status: AC
  Administered 2018-02-25: 40 mg via INTRAVENOUS
  Filled 2018-02-25: qty 4

## 2018-02-25 MED ORDER — SODIUM CHLORIDE 0.9% IV SOLUTION
Freq: Once | INTRAVENOUS | Status: AC
  Administered 2018-02-25: 10:00:00 via INTRAVENOUS

## 2018-02-25 MED ORDER — LIDOCAINE HCL (PF) 1 % IJ SOLN
INTRAMUSCULAR | Status: DC | PRN
  Administered 2018-02-25: 10 mL

## 2018-02-25 MED ORDER — SODIUM CHLORIDE 0.9 % IV SOLN
INTRAVENOUS | Status: DC | PRN
  Administered 2018-02-25: 60 ug/min via INTRAVENOUS

## 2018-02-25 MED ORDER — SODIUM CHLORIDE 0.9 % IV SOLN
INTRAVENOUS | Status: DC
  Administered 2018-02-25: 15:00:00 via INTRAVENOUS

## 2018-02-25 MED ORDER — ACETAMINOPHEN 325 MG PO TABS
650.0000 mg | ORAL_TABLET | Freq: Once | ORAL | Status: AC
  Administered 2018-02-25: 650 mg via ORAL

## 2018-02-25 MED ORDER — LIDOCAINE HCL 1 % IJ SOLN
INTRAMUSCULAR | Status: AC
  Filled 2018-02-25: qty 20

## 2018-02-25 MED ORDER — PROPOFOL 10 MG/ML IV BOLUS
INTRAVENOUS | Status: DC | PRN
  Administered 2018-02-25: 50 mg via INTRAVENOUS
  Administered 2018-02-25 (×2): 20 mg via INTRAVENOUS

## 2018-02-25 MED ORDER — LORAZEPAM 2 MG/ML IJ SOLN: 2.0000 mg | Freq: Four times a day (QID) | INTRAMUSCULAR | Status: DC | PRN

## 2018-02-25 SURGICAL SUPPLY — 15 items

## 2018-02-25 NOTE — Anesthesia Postprocedure Evaluation (Signed)
Anesthesia Post Note  Patient: Matthew Lewis  Procedure(s) Performed: ESOPHAGOGASTRODUODENOSCOPY (EGD) WITH PROPOFOL (N/A ) ESOPHAGEAL BANDING     Patient location during evaluation: Endoscopy Anesthesia Type: MAC Level of consciousness: awake Pain management: pain level controlled Vital Signs Assessment: post-procedure vital signs reviewed and stable Respiratory status: spontaneous breathing Cardiovascular status: stable Postop Assessment: no apparent nausea or vomiting Anesthetic complications: no    Last Vitals:  Vitals:   02/25/18 1514 02/25/18 1539  BP:  (!) 106/50  Pulse:  (!) 111  Resp:  20  Temp: 37.6 C 37.7 C  SpO2:  100%    Last Pain:  Vitals:   02/25/18 1539  TempSrc: Oral  PainSc: 8                  Treyvon Blahut

## 2018-02-25 NOTE — Progress Notes (Signed)
Dr. Candiss Norse made aware of increase in temp, advised to continue with blood transfusion and give 650 PO tylenol, Dr Fuller Plan also made aware prior to EGD.

## 2018-02-25 NOTE — Op Note (Signed)
[facility] Patient Name: Matthew Lewis Procedure Date : 02/25/2018 MRN: [id number] Attending MD: Ladene Artist , MD Date of Birth: 11/30/1955 CSN: [id number] Age: 63 Admit Type: Inpatient Procedure:                Upper GI endoscopy Indications:              Acute post hemorrhagic anemia, Hematemesis Providers:                Pricilla Riffle. Fuller Plan, MD, Burtis Junes, RN, Charolette Child,                            Technician, Hedy Camara, CRNA Referring MD:             [facility] Medicines:                Monitored Anesthesia Care Complications:            No immediate complications. Estimated Blood Loss:     Estimated blood loss: none. Procedure:                Pre-Anesthesia Assessment:                           - Prior to the procedure, a History and Physical                            was performed, and patient medications and                            allergies were reviewed. The patient's tolerance of                            previous anesthesia was also reviewed. The risks                            and benefits of the procedure and the sedation                            options and risks were discussed with the patient.                            All questions were answered, and informed consent                            was obtained. Prior Anticoagulants: The patient has                            taken no previous anticoagulant or antiplatelet                            agents. ASA Grade Assessment: III - A patient with                            severe systemic disease. After reviewing the risks  and benefits, the patient was deemed in                            satisfactory condition to undergo the procedure.                           After obtaining informed consent, the endoscope was                            passed under direct vision. Throughout the                            procedure, the patient's blood pressure,  pulse, and                            oxygen saturations were monitored continuously. The                            GIF-H190 ([id number]) Olympus gastroscope was                            introduced through the mouth, and advanced to the                            second part of duodenum. The upper GI endoscopy was                            accomplished without difficulty. The patient                            tolerated the procedure well. Scope In: Scope Out: Findings:      Grade II varices were found in the distal esophagus. They were 6 mm in       largest diameter.      The exam of the esophagus was otherwise normal.      Moderate portal hypertensive gastropathy was found in the gastric fundus       and in the gastric body.      The exam of the stomach was otherwise normal.      One non-bleeding superficial duodenal ulcer with no stigmata of bleeding       was found at the major papilla. Stent and retention suture at the ulcer       site. The lesion was 6 mm in largest dimension.      A few localized erosions without bleeding were found in the duodenal       bulb.      Two previously placed plastic stents with a retention suture seen in the       second portion of the duodenum. Impression:               - Grade II esophageal varices.                           - Portal hypertensive gastropathy.                           -  One non-bleeding duodenal ulcer with no stigmata                            of bleeding.                           - Duodenal erosions without bleeding.                           - Two plastic stents in the duodenum.                           - No specimens collected. Recommendation:           - Ulcer appears to be the source of the bleed.                           - Return patient to hospital ward for ongoing care.                           - Full liquid diet today.                           - Case discussed with IR, Dr. Kathlene Cote, regarding                             stents, retention suture. If ulcer has persistent                            problems the stents, suture may need to be                            repositioned.                           - Continue present medications including octreotide                            for another day and pantoprazole bid long term.                           - No aspirin, ibuprofen, naproxen, or other                            non-steroidal anti-inflammatory drugs. Procedure Code(s):        --- Professional ---                           [phone], Esophagogastroduodenoscopy, flexible,                            transoral; diagnostic, including collection of                            specimen(s) by brushing or washing, when performed                            (  separate procedure) Diagnosis Code(s):        --- Professional ---                           I85.00, Esophageal varices without bleeding                           K76.6, Portal hypertension                           K31.89, Other diseases of stomach and duodenum                           K26.9, Duodenal ulcer, unspecified as acute or                            chronic, without hemorrhage or perforation                           D62, Acute posthemorrhagic anemia                           K92.0, Hematemesis CPT copyright 2018 [employer]. All rights reserved. The codes documented in this report are preliminary and upon coder review may  be revised to meet current compliance requirements. Ladene Artist, MD 02/25/2018 3:54:42 PM This report has been signed electronically. Number of Addenda: 0

## 2018-02-25 NOTE — Interval H&P Note (Signed)
History and Physical Interval Note:  02/25/2018 3:04 PM  Matthew Lewis  has presented today for surgery, with the diagnosis of gi bleed  The various methods of treatment have been discussed with the patient and family. After consideration of risks, benefits and other options for treatment, the patient has consented to  Procedure(s): ESOPHAGOGASTRODUODENOSCOPY (EGD) WITH PROPOFOL (N/A) as a surgical intervention .  The patient's history has been reviewed, patient examined, no change in status, stable for surgery.  I have reviewed the patient's chart and labs.  Questions were answered to the patient's satisfaction.     Pricilla Riffle. Fuller Plan

## 2018-02-25 NOTE — Procedures (Signed)
PROCEDURE SUMMARY:  Successful image-guided paracentesis from the left lower abdomen.  Yielded 1.0 liters of hazy yellow fluid.  No immediate complications.  EBL: zero Patient tolerated well.   Specimen was sent for labs.  Please see imaging section of Epic for full dictation.  Joaquim Nam PA-C 02/25/2018 1:41 PM

## 2018-02-25 NOTE — Transfer of Care (Signed)
Immediate Anesthesia Transfer of Care Note  Patient: Matthew Lewis  Procedure(s) Performed: ESOPHAGOGASTRODUODENOSCOPY (EGD) WITH PROPOFOL (N/A ) ESOPHAGEAL BANDING  Patient Location: PACU and Endoscopy Unit  Anesthesia Type:MAC  Level of Consciousness: awake, alert , oriented and patient cooperative  Airway & Oxygen Therapy: Patient Spontanous Breathing and Patient connected to nasal cannula oxygen  Post-op Assessment: Report given to RN and Post -op Vital signs reviewed and stable  Post vital signs: Reviewed and stable  Last Vitals:  Vitals Value Taken Time  BP    Temp    Pulse    Resp    SpO2      Last Pain:  Vitals:   02/25/18 1514  TempSrc: Oral  PainSc:       Patients Stated Pain Goal: 0 (35/67/01 4103)  Complications: No apparent anesthesia complications

## 2018-02-25 NOTE — Anesthesia Preprocedure Evaluation (Signed)
Anesthesia Evaluation  Patient identified by MRN, date of birth, ID band Patient awake    Reviewed: Allergy & Precautions, NPO status , Patient's Chart, lab work & pertinent test results  Airway Mallampati: II  TM Distance: >3 FB     Dental   Pulmonary former smoker,    breath sounds clear to auscultation       Cardiovascular hypertension, + CAD   Rhythm:Regular Rate:Normal     Neuro/Psych    GI/Hepatic negative GI ROS, Neg liver ROS,   Endo/Other  diabetes  Renal/GU Renal disease     Musculoskeletal   Abdominal   Peds  Hematology  (+) anemia ,   Anesthesia Other Findings   Reproductive/Obstetrics                             Anesthesia Physical Anesthesia Plan  ASA: III  Anesthesia Plan: General   Post-op Pain Management:    Induction: Intravenous  PONV Risk Score and Plan: 2  Airway Management Planned: Nasal Cannula and Simple Face Mask  Additional Equipment:   Intra-op Plan:   Post-operative Plan:   Informed Consent: I have reviewed the patients History and Physical, chart, labs and discussed the procedure including the risks, benefits and alternatives for the proposed anesthesia with the patient or authorized representative who has indicated his/her understanding and acceptance.     Dental advisory given  Plan Discussed with: Anesthesiologist and CRNA  Anesthesia Plan Comments:         Anesthesia Quick Evaluation

## 2018-02-25 NOTE — Progress Notes (Signed)
PROGRESS NOTE                                                                                                                                                                                                             Patient Demographics:    Matthew Lewis, is a 63 y.o. male, DOB - 1955-10-08, JEH:[id number]  Admit date - 02/24/2018   Admitting Physician Reubin Milan, MD  Outpatient Primary MD for the patient is [name], MD  LOS - 1  Chief Complaint  Patient presents with  . Fall  . Seizures       Brief Narrative   Matthew Lewis is a 63 y.o. male with medical history significant of anal/rectal abscesses, alcoholic cirrhosis, history of substance abuse, PTSD, hypertension, type 2 diabetes, diabetic peripheral neuropathy, history of colon cancer, history of colectomy with lymph node removal, metastatic colon carcinoma who is brought to the emergency department after having sudden onset of blindness for about 10 minutes followed by disorientation.  His wife told the EMS crew that she witnessed generalized seizure activity.  The patient states that he has had insomnia for the past 2 days.  He went to sleep around 2200 last night and slept for a solid hour.  He woke up to go to the bathroom and blacked out.  He woke up and crawled to the bathroom and urinated on himself.  He states that he fell again on the floor and hit his head.  His wife asked his son to call 911.  Once in the ER, the patient had an episode of coffee-ground emesis that was positive for occult blood.    Subjective:    Matthew Lewis today has, No headache, No chest pain, No abdominal pain - No Nausea, No new weakness tingling or numbness, No Cough - SOB.    Assessment  & Plan :      Acute UGI bleed with Acute Blood loss related Anemia - start 2 units of PBRC now, IV PPI and octreotide drip, monitor H&H, GI on board, EGD today.    Cholangiocarcinoma ([place]) - Per Onc on Pall Chemo, Continue  daily drainage of biliary drain exchange.  Long-term prognosis is poor previously he was DNR but now wants to be full code.  Lactic acidosis.  Due to dehydration.  No sepsis.  Monitor with supportive care.  No clinical signs of infection will hold on antibiotics except cefepime which we will continue till cleared from GI standpoint.  Hyperlipemia -  Not on medical therapy.  CAD (coronary artery disease) -  Denies chest pain. Hold aspirin.  ? seizure activity.  Check MRI brain along with EEG and monitor.  Type 2 diabetes mellitus (HCC) - ISS  CBG (last 3)  Recent Labs    02/24/18 1755 02/25/18 0143 02/25/18 0622  GLUCAP 160* 156* 167*      Family Communication  :  None  Code Status :  Full ( changed)  Disposition Plan  :  TBD  Consults  :  GI,  Onco  Procedures  :    EGD 02/25/18 -   DVT Prophylaxis  :  SCDs    Lab Results  Component Value Date   PLT 116 (L) 02/25/2018    Diet :  Diet Order            Diet NPO time specified  Diet effective now               Inpatient Medications Scheduled Meds: . fentaNYL (SUBLIMAZE) injection  50 mcg Intravenous Once  . furosemide  40 mg Intravenous Once  . [START ON 02/27/2018] pantoprazole  40 mg Intravenous Q12H   Continuous Infusions: . sodium chloride    . ceFEPime (MAXIPIME) IV 2 g (02/25/18 0538)  . metronidazole 500 mg (02/25/18 0410)  . octreotide  (SANDOSTATIN)    IV infusion 50 mcg/hr (02/25/18 0745)  . pantoprozole (PROTONIX) infusion 8 mg/hr (02/25/18 0541)  . vancomycin 1,250 mg (02/25/18 0551)   PRN Meds:.diphenhydrAMINE, fentaNYL (SUBLIMAZE) injection, ondansetron **OR** ondansetron (ZOFRAN) IV, sodium chloride flush  Antibiotics  :   Anti-infectives (From admission, onward)   Start     Dose/Rate Route Frequency Ordered Stop   02/24/18 1800  vancomycin (VANCOCIN) 1,250 mg in sodium chloride 0.9 % 250 mL IVPB     1,250 mg 166.7 mL/hr over 90 Minutes Intravenous Every 12 hours 02/24/18 0623      02/24/18 1700  ceFEPIme (MAXIPIME) 2 g in sodium chloride 0.9 % 100 mL IVPB     2 g 200 mL/hr over 30 Minutes Intravenous Every 12 hours 02/24/18 0623     02/24/18 0345  ceFEPIme (MAXIPIME) 2 g in sodium chloride 0.9 % 100 mL IVPB     2 g 200 mL/hr over 30 Minutes Intravenous  Once 02/24/18 0332 02/24/18 0535   02/24/18 0345  metroNIDAZOLE (FLAGYL) IVPB 500 mg     500 mg 100 mL/hr over 60 Minutes Intravenous Every 8 hours 02/24/18 0332     02/24/18 0345  vancomycin (VANCOCIN) IVPB 1000 mg/200 mL premix  Status:  Discontinued     1,000 mg 200 mL/hr over 60 Minutes Intravenous  Once 02/24/18 0332 02/24/18 0334   02/24/18 0345  vancomycin (VANCOCIN) 1,750 mg in sodium chloride 0.9 % 500 mL IVPB     1,750 mg 250 mL/hr over 120 Minutes Intravenous  Once 02/24/18 0334 02/24/18 0853          Objective:   Vitals:   02/24/18 1430 02/24/18 1445 02/24/18 1530 02/24/18 1631  BP: 100/66 118/79 102/67 (!) 78/68  Pulse: (!) 103 (!) 101 (!) 103 100  Resp: (!) 21 20 18 20   Temp:    97.8 F (36.6 C)  TempSrc:    Oral  SpO2: 100% 100% 100% 100%  Weight:    88.9 kg  Height:    6' (1.829 m)    Wt Readings from Last 3 Encounters:  02/24/18 88.9 kg  02/13/18 87.9 kg  02/12/18 89 kg  Intake/Output Summary (Last 24 hours) at 02/25/2018 1001 Last data filed at 02/25/2018 0546 Gross per 24 hour  Intake 515 ml  Output 2695 ml  Net -2180 ml     Physical Exam  Awake Alert, Oriented X 3, No new F.N deficits, Normal affect Matthew Lewis,Matthew Lewis Supple Neck,No JVD, No cervical lymphadenopathy appriciated.  Symmetrical Chest wall movement, Good air movement bilaterally, CTAB RRR,No Gallops,Rubs or new Murmurs, No Parasternal Heave +ve B.Sounds, Abd Soft, No tenderness, No organomegaly appriciated, No rebound - guarding or rigidity. No Cyanosis, Clubbing or edema, No new Rash or bruise      Data Review:    CBC Recent Labs  Lab 02/24/18 0245 02/24/18 0309 02/24/18 1318 02/24/18 1902  02/25/18 0350  WBC 37.1*  --   --  22.3* 18.1*  HGB 5.5* 5.8* 8.3* 7.6* 7.0*  HCT 19.3* 17.0* 26.0* 23.6* 21.5*  PLT 210  --   --  121* 116*  MCV 103.8*  --   --  92.5 93.5  MCH 29.6  --   --  29.8 30.4  MCHC 28.5*  --   --  32.2 32.6  RDW 19.5*  --   --  17.6* 18.0*  LYMPHSABS 2.6  --   --   --  0.6*  MONOABS 2.6*  --   --   --  1.5*  EOSABS 0.4  --   --   --  0.2  BASOSABS 0.0  --   --   --  0.0    Chemistries  Recent Labs  Lab 02/24/18 0245 02/24/18 0309 02/25/18 0350  NA 133* 135 133*  K 4.2 4.3 4.1  CL 106  --  107  CO2 14*  --  22  GLUCOSE 193*  --  186*  BUN 28*  --  18  CREATININE 0.90  --  0.74  CALCIUM 8.4*  --  8.0*  AST 42*  --  701*  ALT 41  --  800*  ALKPHOS 306*  --  293*  BILITOT 1.0  --  1.2   ------------------------------------------------------------------------------------------------------------------ No results for input(s): CHOL, HDL, LDLCALC, TRIG, CHOLHDL, LDLDIRECT in the last 72 hours.  Lab Results  Component Value Date   HGBA1C 4.9 11/09/2017   ------------------------------------------------------------------------------------------------------------------ No results for input(s): TSH, T4TOTAL, T3FREE, THYROIDAB in the last 72 hours.  Invalid input(s): FREET3 ------------------------------------------------------------------------------------------------------------------ No results for input(s): VITAMINB12, FOLATE, FERRITIN, TIBC, IRON, RETICCTPCT in the last 72 hours.  Coagulation profile Recent Labs  Lab 02/24/18 0245  INR 1.31    No results for input(s): DDIMER in the last 72 hours.  Cardiac Enzymes No results for input(s): CKMB, TROPONINI, MYOGLOBIN in the last 168 hours.  Invalid input(s): CK ------------------------------------------------------------------------------------------------------------------ No results found for: BNP  Micro Results Recent Results (from the past 240 hour(s))  MRSA PCR Screening      Status: None   Collection Time: 02/24/18  5:00 PM  Result Value Ref Range Status   MRSA by PCR NEGATIVE NEGATIVE Final    Comment:        The GeneXpert MRSA Assay (FDA approved for NASAL specimens only), is one component of a comprehensive MRSA colonization surveillance program. It is not intended to diagnose MRSA infection nor to guide or monitor treatment for MRSA infections. Performed at [facility], [place] [address] [place], [place] [address]     Radiology Reports Ct Head Wo Contrast  Result Date: 02/24/2018 CLINICAL DATA:  Initial evaluation for acute posttraumatic headache. EXAM: CT HEAD WITHOUT CONTRAST  TECHNIQUE: Contiguous axial images were obtained from the base of the skull through the vertex without intravenous contrast. COMPARISON:  Prior CT from 10/09/2010 FINDINGS: Brain: Cerebral volume within normal limits for patient age. No evidence for acute intracranial hemorrhage. No findings to suggest acute large vessel territory infarct. No mass lesion, midline shift, or mass effect. Ventricles are normal in size without evidence for hydrocephalus. No extra-axial fluid collection identified. Vascular: No hyperdense vessel identified. Skull: Scalp soft tissues demonstrate no acute abnormality. Calvarium intact. Sinuses/Orbits: Globes and orbital soft tissues within normal limits. Visualized paranasal sinuses are clear. No mastoid effusion. IMPRESSION: Negative head CT.  No acute intracranial abnormality. Electronically Signed   By: Jeannine Boga M.D.   On: 02/24/2018 05:52   Ct Chest W Contrast  Result Date: 02/19/2018 CLINICAL DATA:  Patient with metastatic intrahepatic cholangiocarcinoma. Follow-up exam. EXAM: CT CHEST, ABDOMEN, AND PELVIS WITH CONTRAST TECHNIQUE: Multidetector CT imaging of the chest, abdomen and pelvis was performed following the standard protocol during bolus administration of intravenous contrast. CONTRAST:  163m OMNIPAQUE IOHEXOL 300 MG/ML  SOLN  COMPARISON:  CT chest 10/27/2017; CT abdomen pelvis 12/22/2017 FINDINGS: CT CHEST FINDINGS Cardiovascular: Right anterior chest wall Port-A-Cath is present with tip terminating in the superior vena cava. Normal heart size. Trace pericardial effusion. Coronary arterial and thoracic aortic vascular calcifications. Mediastinum/Nodes: Unchanged 1.1 cm right paratracheal lymph node (image 23; series 2). Interval decrease in size of 1.5 cm subcarinal node (image 30; series 2). Similar-appearing 1.5 cm right hilar node (image 34; series 2). Normal appearance of the esophagus. Lungs/Pleura: Central airways are patent. Similar-appearing 2.9 cm left lower lobe nodule (image 113; series 7). Similar-appearing moderate-sized area of consolidation within the right lower lung (image 43; series 2). Similar-appearing peripheral subpleural reticular opacities. No pleural effusion or pneumothorax. Musculoskeletal: Thoracic spine degenerative changes. No aggressive or acute appearing osseous lesions. CT ABDOMEN PELVIS FINDINGS Hepatobiliary: Persistent percutaneous internal external biliary drain traversing the right hepatic lobe terminating in the descending duodenum. Interval insertion of percutaneous biliary drain into the left hepatic lobe with new pneumobilia within the right and left hepatic lobes. Re-demonstrated cirrhotic morphology of the liver. Ill-defined low-attenuation infiltrative mass within the left hepatic lobe (image 53; series 2) is grossly similar measuring 5.6 x 4.8 cm. Grossly similar appearing low-attenuation lesions within the right hepatic lobe. Similar-appearing 3.8 cm subcapsular right hepatic lobe lesion (image 48; series 2). Thrombosis of the left portal vein. Pancreas: Unremarkable Spleen: Enlarged measuring 16 cm. Adrenals/Urinary Tract: Similar-appearing right adrenal mass measuring up to 4.9 cm. Slight interval increase in size of left adrenal mass measuring 2.7 cm (image 64; series 2). Kidneys enhance  symmetrically with contrast. Re-demonstrated 2 mm stone inferior pole left kidney. Urinary bladder is unremarkable. Stomach/Bowel: Large amount of stool throughout the colon. No evidence for small bowel obstruction. Re-demonstrated mildly distended stool-filled descending colon (image 90; series 2). Vascular/Lymphatic: Unchanged 1.8 cm aortocaval node (image 75; series 2). Unchanged 1.5 cm left periaortic lymph node (image 75; series 2). Normal caliber abdominal aorta. Peripheral calcified atherosclerotic plaque. Reproductive: Heterogeneous prostate. Other: Interval development of moderate volume ascites throughout the abdomen. No free intraperitoneal air. Musculoskeletal: Lower thoracic and lumbar spine degenerative changes. IMPRESSION: 1. Slight interval increase in size of left adrenal gland metastasis. Similar-appearing right adrenal gland metastasis. 2. While difficult to measure given ill-defined nature, infiltrative mass within the left hepatic lobe is grossly similar when compared to prior exam. Additional lesions within the liver are grossly similar. 3. Interval insertion of biliary  drain within the left hepatic lobe with resultant decompression of the left hepatic lobe biliary system. 4. Similar-appearing sharply marginated consolidation within the right lower lobe which may represent infectious/inflammatory process or metastatic disease. 5. Large amount of stool throughout the colon as can be seen with constipation. 6. Persistent left hepatic lobe portal venous thrombosis. 7. Splenomegaly. 8. New moderate volume ascites. Electronically Signed   By: Lovey Newcomer M.D.   On: 02/19/2018 14:05   Ct Abdomen Pelvis W Contrast  Result Date: 02/19/2018 CLINICAL DATA:  Patient with metastatic intrahepatic cholangiocarcinoma. Follow-up exam. EXAM: CT CHEST, ABDOMEN, AND PELVIS WITH CONTRAST TECHNIQUE: Multidetector CT imaging of the chest, abdomen and pelvis was performed following the standard protocol during  bolus administration of intravenous contrast. CONTRAST:  115m OMNIPAQUE IOHEXOL 300 MG/ML  SOLN COMPARISON:  CT chest 10/27/2017; CT abdomen pelvis 12/22/2017 FINDINGS: CT CHEST FINDINGS Cardiovascular: Right anterior chest wall Port-A-Cath is present with tip terminating in the superior vena cava. Normal heart size. Trace pericardial effusion. Coronary arterial and thoracic aortic vascular calcifications. Mediastinum/Nodes: Unchanged 1.1 cm right paratracheal lymph node (image 23; series 2). Interval decrease in size of 1.5 cm subcarinal node (image 30; series 2). Similar-appearing 1.5 cm right hilar node (image 34; series 2). Normal appearance of the esophagus. Lungs/Pleura: Central airways are patent. Similar-appearing 2.9 cm left lower lobe nodule (image 113; series 7). Similar-appearing moderate-sized area of consolidation within the right lower lung (image 43; series 2). Similar-appearing peripheral subpleural reticular opacities. No pleural effusion or pneumothorax. Musculoskeletal: Thoracic spine degenerative changes. No aggressive or acute appearing osseous lesions. CT ABDOMEN PELVIS FINDINGS Hepatobiliary: Persistent percutaneous internal external biliary drain traversing the right hepatic lobe terminating in the descending duodenum. Interval insertion of percutaneous biliary drain into the left hepatic lobe with new pneumobilia within the right and left hepatic lobes. Re-demonstrated cirrhotic morphology of the liver. Ill-defined low-attenuation infiltrative mass within the left hepatic lobe (image 53; series 2) is grossly similar measuring 5.6 x 4.8 cm. Grossly similar appearing low-attenuation lesions within the right hepatic lobe. Similar-appearing 3.8 cm subcapsular right hepatic lobe lesion (image 48; series 2). Thrombosis of the left portal vein. Pancreas: Unremarkable Spleen: Enlarged measuring 16 cm. Adrenals/Urinary Tract: Similar-appearing right adrenal mass measuring up to 4.9 cm. Slight  interval increase in size of left adrenal mass measuring 2.7 cm (image 64; series 2). Kidneys enhance symmetrically with contrast. Re-demonstrated 2 mm stone inferior pole left kidney. Urinary bladder is unremarkable. Stomach/Bowel: Large amount of stool throughout the colon. No evidence for small bowel obstruction. Re-demonstrated mildly distended stool-filled descending colon (image 90; series 2). Vascular/Lymphatic: Unchanged 1.8 cm aortocaval node (image 75; series 2). Unchanged 1.5 cm left periaortic lymph node (image 75; series 2). Normal caliber abdominal aorta. Peripheral calcified atherosclerotic plaque. Reproductive: Heterogeneous prostate. Other: Interval development of moderate volume ascites throughout the abdomen. No free intraperitoneal air. Musculoskeletal: Lower thoracic and lumbar spine degenerative changes. IMPRESSION: 1. Slight interval increase in size of left adrenal gland metastasis. Similar-appearing right adrenal gland metastasis. 2. While difficult to measure given ill-defined nature, infiltrative mass within the left hepatic lobe is grossly similar when compared to prior exam. Additional lesions within the liver are grossly similar. 3. Interval insertion of biliary drain within the left hepatic lobe with resultant decompression of the left hepatic lobe biliary system. 4. Similar-appearing sharply marginated consolidation within the right lower lobe which may represent infectious/inflammatory process or metastatic disease. 5. Large amount of stool throughout the colon as can be seen with constipation.  6. Persistent left hepatic lobe portal venous thrombosis. 7. Splenomegaly. 8. New moderate volume ascites. Electronically Signed   By: Lovey Newcomer M.D.   On: 02/19/2018 14:05   Ir Catheter Tube Change  Result Date: 02/19/2018 INDICATION: Biliary obstruction, retracted mild position left internal external biliary drain EXAM: FLUOROSCOPIC EXCHANGE AND REPOSITION OF THE LEFT INTERNAL EXTERNAL  BILIARY DRAIN MEDICATIONS: NONE. ANESTHESIA/SEDATION: None. FLUOROSCOPY TIME:  Fluoroscopy Time: 1 minutes 36 seconds (49 mGy). COMPLICATIONS: None immediate. PROCEDURE: Informed written consent was obtained from the patient after a thorough discussion of the procedural risks, benefits and alternatives. All questions were addressed. Maximal Sterile Barrier Technique was utilized including caps, mask, sterile gowns, sterile gloves, sterile drape, hand hygiene and skin antiseptic. A timeout was performed prior to the initiation of the procedure. Under sterile conditions, the existing retracted left internal external biliary drain was removed over a Bentson guidewire. Kumpe catheter advanced. Limited cholangiogram performed. Biliary confluence identified. Catheter and guidewire access manipulated through the left biliary tree into the duodenum. Contrast injection confirms position. New 10 [place] internal external biliary drain and advanced. Retention loop formed in the duodenum. Contrast injection confirms position. Catheter secured with a StatLock device and a dressing. No immediate complication. Patient tolerated the procedure well. IMPRESSION: Successful fluoroscopic 10 French left internal external biliary drain reposition and exchange. Electronically Signed   By: Jerilynn Mages.  Shick M.D.   On: 02/19/2018 16:24   Dg Chest Port 1 View  Result Date: 02/24/2018 CLINICAL DATA:  63 year old male with hypotension, possible seizure. Metastatic cholangiocarcinoma. EXAM: PORTABLE CHEST 1 VIEW COMPARISON:  Chest CT 02/19/2018. 12/03/2017 radiographs and earlier. FINDINGS: Portable AP semi upright view at 0220 hours. Right chest power port now in place. Mediastinal contours remain normal. Left lung base pulmonary nodule is subtle radiographically. Other lung markings appear stable. No pneumothorax, pulmonary edema, pleural effusion or new pulmonary opacity. Partially visible biliary drains. IMPRESSION: No new cardiopulmonary  abnormality. Left lower lobe pulmonary metastasis better demonstrated by CT. Electronically Signed   By: Genevie Ann M.D.   On: 02/24/2018 03:40   Dg Shoulder Left  Result Date: 02/24/2018 CLINICAL DATA:  Left shoulder pain since a fall last night. Initial encounter. EXAM: LEFT SHOULDER - 2+ VIEW COMPARISON:  None. FINDINGS: There is no acute bony or joint abnormality. Remote healed distal clavicle fracture is noted. Acromioclavicular osteoarthritis is seen. Soft tissues are unremarkable. IMPRESSION: No acute finding. Remote healed distal clavicle fracture. Acromioclavicular osteoarthritis. Electronically Signed   By: Inge Rise M.D.   On: 02/24/2018 08:47   Ir Exchange Biliary Drain  Result Date: 02/03/2018 INDICATION: Cholangiocarcinoma, chronic indwelling internal external bilateral biliary drains EXAM: Fluoroscopic exchange of the bilateral 10 [place] internal external biliary drains MEDICATIONS: 400 mg Cipro; The antibiotic was administered within an appropriate time frame prior to the initiation of the procedure. ANESTHESIA/SEDATION: Moderate (conscious) sedation was employed during this procedure. A total of Versed 2.0 mg and Fentanyl 100 mcg was administered intravenously. Moderate Sedation Time: 10 minutes. The patient's level of consciousness and vital signs were monitored continuously by radiology nursing throughout the procedure under my direct supervision. FLUOROSCOPY TIME:  Fluoroscopy Time: 2 minutes 48 seconds (102 mGy). COMPLICATIONS: None immediate. PROCEDURE: Informed written consent was obtained from the patient after a thorough discussion of the procedural risks, benefits and alternatives. All questions were addressed. Maximal Sterile Barrier Technique was utilized including caps, mask, sterile gowns, sterile gloves, sterile drape, hand hygiene and skin antiseptic. A timeout was performed prior to the initiation of  the procedure. Under sterile conditions, the existing bilateral 10  [place] internal external biliary drains were removed over Bentson guidewires. New 10 French drains were advanced. Retention loops formed in the duodenum. Contrast injection confirms position and adequate biliary drainage. No immediate complication. Catheters secured with a StatLock devices. External caps applied. Patient was given gravity drainage packs for home use if needed. No immediate complication. Patient tolerated the procedure well. IMPRESSION: Successful fluoroscopic exchange of the bilateral 10 [place] internal external biliary drains. Electronically Signed   By: Jerilynn Mages.  Shick M.D.   On: 02/03/2018 16:40   Ir Exchange Biliary Drain  Result Date: 02/03/2018 INDICATION: Cholangiocarcinoma, chronic indwelling internal external bilateral biliary drains EXAM: Fluoroscopic exchange of the bilateral 10 [place] internal external biliary drains MEDICATIONS: 400 mg Cipro; The antibiotic was administered within an appropriate time frame prior to the initiation of the procedure. ANESTHESIA/SEDATION: Moderate (conscious) sedation was employed during this procedure. A total of Versed 2.0 mg and Fentanyl 100 mcg was administered intravenously. Moderate Sedation Time: 10 minutes. The patient's level of consciousness and vital signs were monitored continuously by radiology nursing throughout the procedure under my direct supervision. FLUOROSCOPY TIME:  Fluoroscopy Time: 2 minutes 48 seconds (102 mGy). COMPLICATIONS: None immediate. PROCEDURE: Informed written consent was obtained from the patient after a thorough discussion of the procedural risks, benefits and alternatives. All questions were addressed. Maximal Sterile Barrier Technique was utilized including caps, mask, sterile gowns, sterile gloves, sterile drape, hand hygiene and skin antiseptic. A timeout was performed prior to the initiation of the procedure. Under sterile conditions, the existing bilateral 10 [place] internal external biliary drains were removed over  Bentson guidewires. New 10 French drains were advanced. Retention loops formed in the duodenum. Contrast injection confirms position and adequate biliary drainage. No immediate complication. Catheters secured with a StatLock devices. External caps applied. Patient was given gravity drainage packs for home use if needed. No immediate complication. Patient tolerated the procedure well. IMPRESSION: Successful fluoroscopic exchange of the bilateral 10 [place] internal external biliary drains. Electronically Signed   By: Jerilynn Mages.  Shick M.D.   On: 02/03/2018 16:40    Time Spent in minutes  30   Lala Lund M.D on 02/25/2018 at 10:01 AM  To page go to [email or web address] - password [facility]

## 2018-02-25 NOTE — Plan of Care (Signed)

## 2018-02-25 NOTE — Progress Notes (Signed)
Pt in a procedure at this time and not available for EEG. Will attempt as schedule permits

## 2018-02-26 ENCOUNTER — Inpatient Hospital Stay: Payer: Medicare Other

## 2018-02-26 ENCOUNTER — Inpatient Hospital Stay: Payer: Medicare Other | Admitting: Hematology

## 2018-02-26 ENCOUNTER — Inpatient Hospital Stay (HOSPITAL_COMMUNITY): Payer: Medicare Other

## 2018-02-26 DIAGNOSIS — R55 Syncope and collapse: Secondary | ICD-10-CM

## 2018-02-26 LAB — COMPREHENSIVE METABOLIC PANEL
ALT: 736 U/L — ABNORMAL HIGH (ref 0–44)
AST: 398 U/L — ABNORMAL HIGH (ref 15–41)
Albumin: 2 g/dL — ABNORMAL LOW (ref 3.5–5.0)
Alkaline Phosphatase: 263 U/L — ABNORMAL HIGH (ref 38–126)
Anion gap: 7 (ref 5–15)
BUN: 12 mg/dL (ref 8–23)
CO2: 22 mmol/L (ref 22–32)
Calcium: 7.7 mg/dL — ABNORMAL LOW (ref 8.9–10.3)
Chloride: 103 mmol/L (ref 98–111)
Creatinine, Ser: 0.71 mg/dL (ref 0.61–1.24)
GFR calc non Af Amer: >60 mL/min (ref >60)
Glucose, Bld: 174 mg/dL — ABNORMAL HIGH (ref 70–99)
Potassium: 3.5 mmol/L (ref 3.5–5.1)
Sodium: 132 mmol/L — ABNORMAL LOW (ref 135–145)
Total Bilirubin: 2.1 mg/dL — ABNORMAL HIGH (ref 0.3–1.2)
Total Protein: 4.9 g/dL — ABNORMAL LOW (ref 6.5–8.1)

## 2018-02-26 LAB — CBC WITH DIFFERENTIAL/PLATELET
Abs Immature Granulocytes: 0.35 10*3/uL — ABNORMAL HIGH (ref 0.00–0.07)
BASOS ABS: 0 10*3/uL (ref 0.0–0.1)
Basophils Relative: 0 %
Eosinophils Absolute: 0.1 10*3/uL (ref 0.0–0.5)
Eosinophils Relative: 1 %
HCT: 25.9 % — ABNORMAL LOW (ref 39.0–52.0)
Hemoglobin: 8.6 g/dL — ABNORMAL LOW (ref 13.0–17.0)
Immature Granulocytes: 3 %
Lymphocytes Relative: 5 %
Lymphs Abs: 0.7 10*3/uL (ref 0.7–4.0)
MCH: 30.6 pg (ref 26.0–34.0)
MCHC: 33.2 g/dL (ref 30.0–36.0)
MCV: 92.2 fL (ref 80.0–100.0)
Monocytes Absolute: 1.7 10*3/uL — ABNORMAL HIGH (ref 0.1–1.0)
Monocytes Relative: 12 %
NRBC: 0 % (ref 0.0–0.2)
Neutro Abs: 11.4 10*3/uL — ABNORMAL HIGH (ref 1.7–7.7)
Neutrophils Relative %: 79 %
Platelets: 115 10*3/uL — ABNORMAL LOW (ref 150–400)
RBC: 2.81 MIL/uL — ABNORMAL LOW (ref 4.22–5.81)
RDW: 17.2 % — AB (ref 11.5–15.5)
WBC: 14.2 10*3/uL — ABNORMAL HIGH (ref 4.0–10.5)

## 2018-02-26 LAB — GLUCOSE, CAPILLARY
Glucose-Capillary: 173 mg/dL — ABNORMAL HIGH (ref 70–99)
Glucose-Capillary: 166 mg/dL — ABNORMAL HIGH (ref 70–99)
Glucose-Capillary: 197 mg/dL — ABNORMAL HIGH (ref 70–99)

## 2018-02-26 LAB — PATHOLOGIST SMEAR REVIEW

## 2018-02-26 MED ORDER — GABAPENTIN 800 MG PO TABS
800.0000 mg | ORAL_TABLET | Freq: Every day | ORAL | Status: DC
  Filled 2018-02-26: qty 1

## 2018-02-26 MED ORDER — GABAPENTIN 400 MG PO CAPS
800.0000 mg | ORAL_CAPSULE | Freq: Every day | ORAL | Status: DC
  Administered 2018-02-26: 800 mg via ORAL
  Filled 2018-02-26: qty 2

## 2018-02-26 MED ORDER — GABAPENTIN 400 MG PO CAPS
400.0000 mg | ORAL_CAPSULE | Freq: Every day | ORAL | Status: DC
  Administered 2018-02-26 – 2018-02-27 (×2): 400 mg via ORAL
  Filled 2018-02-26 (×2): qty 1

## 2018-02-26 MED ORDER — POTASSIUM CHLORIDE CRYS ER 20 MEQ PO TBCR
40.0000 meq | EXTENDED_RELEASE_TABLET | Freq: Once | ORAL | Status: AC
  Administered 2018-02-26: 40 meq via ORAL
  Filled 2018-02-26: qty 2

## 2018-02-26 MED ORDER — GABAPENTIN 800 MG PO TABS
400.0000 mg | ORAL_TABLET | Freq: Every day | ORAL | Status: DC
  Filled 2018-02-26 (×2): qty 0.5

## 2018-02-26 MED ORDER — PANTOPRAZOLE SODIUM 40 MG PO TBEC
40.0000 mg | DELAYED_RELEASE_TABLET | Freq: Two times a day (BID) | ORAL | Status: DC
  Administered 2018-02-26 – 2018-02-27 (×2): 40 mg via ORAL
  Filled 2018-02-26 (×2): qty 1

## 2018-02-26 NOTE — Progress Notes (Signed)
PROGRESS NOTE                                                                                                                                                                                                             Patient Demographics:    Matthew Lewis, is a 63 y.o. male, DOB - 10-20-1955, IWO:[id number]  Admit date - 02/24/2018   Admitting Physician Reubin Milan, MD  Outpatient Primary MD for the patient is [name], MD  LOS - 2  Chief Complaint  Patient presents with  . Fall  . Seizures       Brief Narrative   Matthew Lewis is a 63 y.o. male with medical history significant of anal/rectal abscesses, alcoholic cirrhosis, history of substance abuse, PTSD, hypertension, type 2 diabetes, diabetic peripheral neuropathy, history of colon cancer, history of colectomy with lymph node removal, metastatic colon carcinoma who is brought to the emergency department after having sudden onset of blindness for about 10 minutes followed by disorientation.  His wife told the EMS crew that she witnessed generalized seizure activity.  The patient states that he has had insomnia for the past 2 days.  He went to sleep around 2200 last night and slept for a solid hour.  He woke up to go to the bathroom and blacked out.  He woke up and crawled to the bathroom and urinated on himself.  He states that he fell again on the floor and hit his head.  His wife asked his son to call 911.  Once in the ER, the patient had an episode of coffee-ground emesis that was positive for occult blood.    Subjective:   Patient in bed, appears comfortable, denies any headache, no fever, no chest pain or pressure, no shortness of breath , no abdominal pain. No focal weakness.    Assessment  & Plan :      Acute UGI bleed with Acute Blood loss related Anemia - s/p 2 unit of packed RBC transfusion, posttransfusion H&H stable, on IV PPI and octreotide drip, EGD showing nonbleeding varices.   Continue supportive care continue to monitor H&H.  No signs of ongoing bleed.  Cholangiocarcinoma ([place]) - Per Onc on Pall Chemo, Continue daily drainage of biliary drain exchange.  Long-term prognosis is poor previously he was DNR but now wants to be full code.  Still has evidence of portal hypertension ascites, ultrasound-guided paracentesis was ordered by GI which was done on 02/25/2018 with 1 L of straw fluid  removed.  No signs of SBP.  Lactic acidosis.  Due to dehydration.  No sepsis.  Monitor with supportive care.  No clinical signs of infection will hold on antibiotics except cefepime which we will continue till cleared from GI standpoint.  Hyperlipemia - Not on medical therapy.  CAD (coronary artery disease) -  Denies chest pain. Hold aspirin.  ? seizure activity.  MRI pending, EEG done report pending, neuro input.  Type 2 diabetes mellitus ([facility]) - ISS  CBG (last 3)  Recent Labs    02/25/18 1152 02/25/18 1807 02/26/18 0836  GLUCAP 176* 198* 166*      Family Communication  :  None  Code Status :  Full ( changed)  Disposition Plan  :  TBD  Consults  :  GI,  Onco, Neuro  Procedures  :    Ultrasound-guided paracentesis.  1 L fluid removed.  No SBP.    MRI brain.    EEG.    EGD 02/25/18 -    non-bleeding grade 2 gastric varices with no stigmata of bleeding.  Portal gastropathy.  2 stents, on with visible retention suture, in duodenum.  Non-bleeding duodenal erosions.  Non-bleeding, superficial, duodenal ulcer (likely source of bleeding). Day 3 Octreotide (finishes 2/21 at 1030) and PPI (finishes tomorrow @ [phone]) drips.  Celebrex and No PPI PTA.   DVT Prophylaxis  :  SCDs    Lab Results  Component Value Date   PLT 115 (L) 02/26/2018    Diet :  Diet Order            Diet Carb Modified Fluid consistency: Thin; Room service appropriate? Yes  Diet effective now               Inpatient Medications Scheduled Meds: . fentaNYL (SUBLIMAZE) injection  50 mcg  Intravenous Once  . gabapentin  400 mg Oral Daily  . gabapentin  800 mg Oral q1800  . pantoprazole  40 mg Oral BID   Continuous Infusions: . ceFEPime (MAXIPIME) IV 2 g (02/26/18 0449)  . metronidazole 500 mg (02/26/18 0247)   PRN Meds:.acetaminophen, diphenhydrAMINE, fentaNYL (SUBLIMAZE) injection, lidocaine (PF), LORazepam, LORazepam, [DISCONTINUED] ondansetron **OR** ondansetron (ZOFRAN) IV, sodium chloride flush  Antibiotics  :   Anti-infectives (From admission, onward)   Start     Dose/Rate Route Frequency Ordered Stop   02/24/18 1800  vancomycin (VANCOCIN) 1,250 mg in sodium chloride 0.9 % 250 mL IVPB  Status:  Discontinued     1,250 mg 166.7 mL/hr over 90 Minutes Intravenous Every 12 hours 02/24/18 0623 02/25/18 1015   02/24/18 1700  ceFEPIme (MAXIPIME) 2 g in sodium chloride 0.9 % 100 mL IVPB     2 g 200 mL/hr over 30 Minutes Intravenous Every 12 hours 02/24/18 0623     02/24/18 0345  ceFEPIme (MAXIPIME) 2 g in sodium chloride 0.9 % 100 mL IVPB     2 g 200 mL/hr over 30 Minutes Intravenous  Once 02/24/18 0332 02/24/18 0535   02/24/18 0345  metroNIDAZOLE (FLAGYL) IVPB 500 mg     500 mg 100 mL/hr over 60 Minutes Intravenous Every 8 hours 02/24/18 0332     02/24/18 0345  vancomycin (VANCOCIN) IVPB 1000 mg/200 mL premix  Status:  Discontinued     1,000 mg 200 mL/hr over 60 Minutes Intravenous  Once 02/24/18 0332 02/24/18 0334   02/24/18 0345  vancomycin (VANCOCIN) 1,750 mg in sodium chloride 0.9 % 500 mL IVPB     1,750 mg 250 mL/hr over 120  Minutes Intravenous  Once 02/24/18 0334 02/24/18 0853          Objective:   Vitals:   02/25/18 1603 02/25/18 2100 02/26/18 0500 02/26/18 0900  BP: 104/61   103/63  Pulse: (!) 109   89  Resp: 18   18  Temp: 99.9 F (37.7 C) 98.4 F (36.9 C) 98.5 F (36.9 C) 98.5 F (36.9 C)  TempSrc: Oral Oral  Oral  SpO2: 98%   100%  Weight:      Height:        Wt Readings from Last 3 Encounters:  02/24/18 88.9 kg  02/13/18 87.9 kg    02/12/18 89 kg     Intake/Output Summary (Last 24 hours) at 02/26/2018 1032 Last data filed at 02/26/2018 0837 Gross per 24 hour  Intake 1168 ml  Output 4025 ml  Net -2857 ml     Physical Exam  Awake Alert, Oriented X 3, No new F.N deficits, Normal affect [place].AT,PERRAL Supple Neck,No JVD, No cervical lymphadenopathy appriciated.  Symmetrical Chest wall movement, Good air movement bilaterally, CTAB RRR,No Gallops, Rubs or new Murmurs, No Parasternal Heave +ve B.Sounds, Abd Soft, No tenderness, No organomegaly appriciated, No rebound - guarding or rigidity. No Cyanosis, Clubbing or edema, No new Rash or bruise     Data Review:    CBC Recent Labs  Lab 02/24/18 0245  02/24/18 1902 02/25/18 0350 02/25/18 1459 02/25/18 2301 02/26/18 0430  WBC 37.1*  --  22.3* 18.1*  --   --  14.2*  HGB 5.5*   < > 7.6* 7.0* 8.5* 8.6* 8.6*  HCT 19.3*   < > 23.6* 21.5* 25.0* 26.4* 25.9*  PLT 210  --  121* 116*  --   --  115*  MCV 103.8*  --  92.5 93.5  --   --  92.2  MCH 29.6  --  29.8 30.4  --   --  30.6  MCHC 28.5*  --  32.2 32.6  --   --  33.2  RDW 19.5*  --  17.6* 18.0*  --   --  17.2*  LYMPHSABS 2.6  --   --  0.6*  --   --  0.7  MONOABS 2.6*  --   --  1.5*  --   --  1.7*  EOSABS 0.4  --   --  0.2  --   --  0.1  BASOSABS 0.0  --   --  0.0  --   --  0.0   < > = values in this interval not displayed.    Chemistries  Recent Labs  Lab 02/24/18 0245 02/24/18 0309 02/25/18 0350 02/25/18 1459 02/26/18 0430  NA 133* 135 133* 134* 132*  K 4.2 4.3 4.1 4.2 3.5  CL 106  --  107  --  103  CO2 14*  --  22  --  22  GLUCOSE 193*  --  186* 191* 174*  BUN 28*  --  18  --  12  CREATININE 0.90  --  0.74  --  0.71  CALCIUM 8.4*  --  8.0*  --  7.7*  AST 42*  --  701*  --  398*  ALT 41  --  800*  --  736*  ALKPHOS 306*  --  293*  --  263*  BILITOT 1.0  --  1.2  --  2.1*   ------------------------------------------------------------------------------------------------------------------ No  results for input(s): CHOL, HDL, LDLCALC, TRIG, CHOLHDL, LDLDIRECT in the last 72 hours.  Lab Results  Component Value Date   HGBA1C 4.9 11/09/2017   ------------------------------------------------------------------------------------------------------------------ No results for input(s): TSH, T4TOTAL, T3FREE, THYROIDAB in the last 72 hours.  Invalid input(s): FREET3 ------------------------------------------------------------------------------------------------------------------ No results for input(s): VITAMINB12, FOLATE, FERRITIN, TIBC, IRON, RETICCTPCT in the last 72 hours.  Coagulation profile Recent Labs  Lab 02/24/18 0245  INR 1.31    No results for input(s): DDIMER in the last 72 hours.  Cardiac Enzymes No results for input(s): CKMB, TROPONINI, MYOGLOBIN in the last 168 hours.  Invalid input(s): CK ------------------------------------------------------------------------------------------------------------------ No results found for: BNP  Micro Results Recent Results (from the past 240 hour(s))  Blood culture (routine x 2)     Status: None (Preliminary result)   Collection Time: 02/24/18  2:47 AM  Result Value Ref Range Status   Specimen Description BLOOD RIGHT FOREARM  Final   Special Requests   Final    BOTTLES DRAWN AEROBIC ONLY Blood Culture results may not be optimal due to an inadequate volume of blood received in culture bottles   Culture   Final    NO GROWTH 1 DAY Performed at [facility], [place] [address], [place], [place] [address]    Report Status PENDING  Incomplete  Blood culture (routine x 2)     Status: None (Preliminary result)   Collection Time: 02/24/18  2:48 AM  Result Value Ref Range Status   Specimen Description BLOOD RIGHT HAND  Final   Special Requests   Final    BOTTLES DRAWN AEROBIC AND ANAEROBIC Blood Culture results may not be optimal due to an inadequate volume of blood received in culture bottles   Culture   Final    NO GROWTH  1 DAY Performed at [facility], [place] [address] [place], [place] [address]    Report Status PENDING  Incomplete  MRSA PCR Screening     Status: None   Collection Time: 02/24/18  5:00 PM  Result Value Ref Range Status   MRSA by PCR NEGATIVE NEGATIVE Final    Comment:        The GeneXpert MRSA Assay (FDA approved for NASAL specimens only), is one component of a comprehensive MRSA colonization surveillance program. It is not intended to diagnose MRSA infection nor to guide or monitor treatment for MRSA infections. Performed at [facility], [place] [address] [place], [place] [address]   Gram stain     Status: None   Collection Time: 02/25/18  1:43 PM  Result Value Ref Range Status   Specimen Description PERITONEAL  Final   Special Requests NONE  Final   Gram Stain   Final    MODERATE WBC PRESENT,BOTH PMN AND MONONUCLEAR NO ORGANISMS SEEN Performed at [facility], [address], [place], [place] [address]    Report Status 02/25/2018 FINAL  Final    Radiology Reports Ct Head Wo Contrast  Result Date: 02/24/2018 CLINICAL DATA:  Initial evaluation for acute posttraumatic headache. EXAM: CT HEAD WITHOUT CONTRAST TECHNIQUE: Contiguous axial images were obtained from the base of the skull through the vertex without intravenous contrast. COMPARISON:  Prior CT from 10/09/2010 FINDINGS: Brain: Cerebral volume within normal limits for patient age. No evidence for acute intracranial hemorrhage. No findings to suggest acute large vessel territory infarct. No mass lesion, midline shift, or mass effect. Ventricles are normal in size without evidence for hydrocephalus. No extra-axial fluid collection identified. Vascular: No hyperdense vessel identified. Skull: Scalp soft tissues demonstrate no acute abnormality. Calvarium intact.  Sinuses/Orbits: Globes and orbital soft tissues within normal limits. Visualized paranasal sinuses are clear. No mastoid effusion. IMPRESSION: Negative  head CT.  No acute intracranial abnormality. Electronically Signed   By: Jeannine Boga M.D.   On: 02/24/2018 05:52   Ct Chest W Contrast  Result Date: 02/19/2018 CLINICAL DATA:  Patient with metastatic intrahepatic cholangiocarcinoma. Follow-up exam. EXAM: CT CHEST, ABDOMEN, AND PELVIS WITH CONTRAST TECHNIQUE: Multidetector CT imaging of the chest, abdomen and pelvis was performed following the standard protocol during bolus administration of intravenous contrast. CONTRAST:  155m OMNIPAQUE IOHEXOL 300 MG/ML  SOLN COMPARISON:  CT chest 10/27/2017; CT abdomen pelvis 12/22/2017 FINDINGS: CT CHEST FINDINGS Cardiovascular: Right anterior chest wall Port-A-Cath is present with tip terminating in the superior vena cava. Normal heart size. Trace pericardial effusion. Coronary arterial and thoracic aortic vascular calcifications. Mediastinum/Nodes: Unchanged 1.1 cm right paratracheal lymph node (image 23; series 2). Interval decrease in size of 1.5 cm subcarinal node (image 30; series 2). Similar-appearing 1.5 cm right hilar node (image 34; series 2). Normal appearance of the esophagus. Lungs/Pleura: Central airways are patent. Similar-appearing 2.9 cm left lower lobe nodule (image 113; series 7). Similar-appearing moderate-sized area of consolidation within the right lower lung (image 43; series 2). Similar-appearing peripheral subpleural reticular opacities. No pleural effusion or pneumothorax. Musculoskeletal: Thoracic spine degenerative changes. No aggressive or acute appearing osseous lesions. CT ABDOMEN PELVIS FINDINGS Hepatobiliary: Persistent percutaneous internal external biliary drain traversing the right hepatic lobe terminating in the descending duodenum. Interval insertion of percutaneous biliary drain into the left hepatic lobe with new pneumobilia within the right and left hepatic lobes. Re-demonstrated cirrhotic morphology of the liver. Ill-defined low-attenuation infiltrative mass within the left  hepatic lobe (image 53; series 2) is grossly similar measuring 5.6 x 4.8 cm. Grossly similar appearing low-attenuation lesions within the right hepatic lobe. Similar-appearing 3.8 cm subcapsular right hepatic lobe lesion (image 48; series 2). Thrombosis of the left portal vein. Pancreas: Unremarkable Spleen: Enlarged measuring 16 cm. Adrenals/Urinary Tract: Similar-appearing right adrenal mass measuring up to 4.9 cm. Slight interval increase in size of left adrenal mass measuring 2.7 cm (image 64; series 2). Kidneys enhance symmetrically with contrast. Re-demonstrated 2 mm stone inferior pole left kidney. Urinary bladder is unremarkable. Stomach/Bowel: Large amount of stool throughout the colon. No evidence for small bowel obstruction. Re-demonstrated mildly distended stool-filled descending colon (image 90; series 2). Vascular/Lymphatic: Unchanged 1.8 cm aortocaval node (image 75; series 2). Unchanged 1.5 cm left periaortic lymph node (image 75; series 2). Normal caliber abdominal aorta. Peripheral calcified atherosclerotic plaque. Reproductive: Heterogeneous prostate. Other: Interval development of moderate volume ascites throughout the abdomen. No free intraperitoneal air. Musculoskeletal: Lower thoracic and lumbar spine degenerative changes. IMPRESSION: 1. Slight interval increase in size of left adrenal gland metastasis. Similar-appearing right adrenal gland metastasis. 2. While difficult to measure given ill-defined nature, infiltrative mass within the left hepatic lobe is grossly similar when compared to prior exam. Additional lesions within the liver are grossly similar. 3. Interval insertion of biliary drain within the left hepatic lobe with resultant decompression of the left hepatic lobe biliary system. 4. Similar-appearing sharply marginated consolidation within the right lower lobe which may represent infectious/inflammatory process or metastatic disease. 5. Large amount of stool throughout the colon as  can be seen with constipation. 6. Persistent left hepatic lobe portal venous thrombosis. 7. Splenomegaly. 8. New moderate volume ascites. Electronically Signed   By: DLovey NewcomerM.D.   On: 02/19/2018 14:05   Ct Abdomen Pelvis W Contrast  Result Date: 02/19/2018 CLINICAL DATA:  Patient with metastatic intrahepatic cholangiocarcinoma. Follow-up exam. EXAM: CT CHEST, ABDOMEN, AND PELVIS WITH CONTRAST TECHNIQUE: Multidetector CT imaging of the chest, abdomen and pelvis was performed following the standard protocol during bolus administration of intravenous contrast. CONTRAST:  162m OMNIPAQUE IOHEXOL 300 MG/ML  SOLN COMPARISON:  CT chest 10/27/2017; CT abdomen pelvis 12/22/2017 FINDINGS: CT CHEST FINDINGS Cardiovascular: Right anterior chest wall Port-A-Cath is present with tip terminating in the superior vena cava. Normal heart size. Trace pericardial effusion. Coronary arterial and thoracic aortic vascular calcifications. Mediastinum/Nodes: Unchanged 1.1 cm right paratracheal lymph node (image 23; series 2). Interval decrease in size of 1.5 cm subcarinal node (image 30; series 2). Similar-appearing 1.5 cm right hilar node (image 34; series 2). Normal appearance of the esophagus. Lungs/Pleura: Central airways are patent. Similar-appearing 2.9 cm left lower lobe nodule (image 113; series 7). Similar-appearing moderate-sized area of consolidation within the right lower lung (image 43; series 2). Similar-appearing peripheral subpleural reticular opacities. No pleural effusion or pneumothorax. Musculoskeletal: Thoracic spine degenerative changes. No aggressive or acute appearing osseous lesions. CT ABDOMEN PELVIS FINDINGS Hepatobiliary: Persistent percutaneous internal external biliary drain traversing the right hepatic lobe terminating in the descending duodenum. Interval insertion of percutaneous biliary drain into the left hepatic lobe with new pneumobilia within the right and left hepatic lobes. Re-demonstrated  cirrhotic morphology of the liver. Ill-defined low-attenuation infiltrative mass within the left hepatic lobe (image 53; series 2) is grossly similar measuring 5.6 x 4.8 cm. Grossly similar appearing low-attenuation lesions within the right hepatic lobe. Similar-appearing 3.8 cm subcapsular right hepatic lobe lesion (image 48; series 2). Thrombosis of the left portal vein. Pancreas: Unremarkable Spleen: Enlarged measuring 16 cm. Adrenals/Urinary Tract: Similar-appearing right adrenal mass measuring up to 4.9 cm. Slight interval increase in size of left adrenal mass measuring 2.7 cm (image 64; series 2). Kidneys enhance symmetrically with contrast. Re-demonstrated 2 mm stone inferior pole left kidney. Urinary bladder is unremarkable. Stomach/Bowel: Large amount of stool throughout the colon. No evidence for small bowel obstruction. Re-demonstrated mildly distended stool-filled descending colon (image 90; series 2). Vascular/Lymphatic: Unchanged 1.8 cm aortocaval node (image 75; series 2). Unchanged 1.5 cm left periaortic lymph node (image 75; series 2). Normal caliber abdominal aorta. Peripheral calcified atherosclerotic plaque. Reproductive: Heterogeneous prostate. Other: Interval development of moderate volume ascites throughout the abdomen. No free intraperitoneal air. Musculoskeletal: Lower thoracic and lumbar spine degenerative changes. IMPRESSION: 1. Slight interval increase in size of left adrenal gland metastasis. Similar-appearing right adrenal gland metastasis. 2. While difficult to measure given ill-defined nature, infiltrative mass within the left hepatic lobe is grossly similar when compared to prior exam. Additional lesions within the liver are grossly similar. 3. Interval insertion of biliary drain within the left hepatic lobe with resultant decompression of the left hepatic lobe biliary system. 4. Similar-appearing sharply marginated consolidation within the right lower lobe which may represent  infectious/inflammatory process or metastatic disease. 5. Large amount of stool throughout the colon as can be seen with constipation. 6. Persistent left hepatic lobe portal venous thrombosis. 7. Splenomegaly. 8. New moderate volume ascites. Electronically Signed   By: DLovey NewcomerM.D.   On: 02/19/2018 14:05   Ir Catheter Tube Change  Result Date: 02/19/2018 INDICATION: Biliary obstruction, retracted mild position left internal external biliary drain EXAM: FLUOROSCOPIC EXCHANGE AND REPOSITION OF THE LEFT INTERNAL EXTERNAL BILIARY DRAIN MEDICATIONS: NONE. ANESTHESIA/SEDATION: None. FLUOROSCOPY TIME:  Fluoroscopy Time: 1 minutes 36 seconds (49 mGy). COMPLICATIONS: None immediate. PROCEDURE: Informed written consent was  obtained from the patient after a thorough discussion of the procedural risks, benefits and alternatives. All questions were addressed. Maximal Sterile Barrier Technique was utilized including caps, mask, sterile gowns, sterile gloves, sterile drape, hand hygiene and skin antiseptic. A timeout was performed prior to the initiation of the procedure. Under sterile conditions, the existing retracted left internal external biliary drain was removed over a Bentson guidewire. Kumpe catheter advanced. Limited cholangiogram performed. Biliary confluence identified. Catheter and guidewire access manipulated through the left biliary tree into the duodenum. Contrast injection confirms position. New 10 French internal external biliary drain and advanced. Retention loop formed in the duodenum. Contrast injection confirms position. Catheter secured with a StatLock device and a dressing. No immediate complication. Patient tolerated the procedure well. IMPRESSION: Successful fluoroscopic 10 French left internal external biliary drain reposition and exchange. Electronically Signed   By: Jerilynn Mages.  Shick M.D.   On: 02/19/2018 16:24   Dg Chest Port 1 View  Result Date: 02/24/2018 CLINICAL DATA:  63 year old male with  hypotension, possible seizure. Metastatic cholangiocarcinoma. EXAM: PORTABLE CHEST 1 VIEW COMPARISON:  Chest CT 02/19/2018. 12/03/2017 radiographs and earlier. FINDINGS: Portable AP semi upright view at 0220 hours. Right chest power port now in place. Mediastinal contours remain normal. Left lung base pulmonary nodule is subtle radiographically. Other lung markings appear stable. No pneumothorax, pulmonary edema, pleural effusion or new pulmonary opacity. Partially visible biliary drains. IMPRESSION: No new cardiopulmonary abnormality. Left lower lobe pulmonary metastasis better demonstrated by CT. Electronically Signed   By: Genevie Ann M.D.   On: 02/24/2018 03:40   Dg Shoulder Left  Result Date: 02/24/2018 CLINICAL DATA:  Left shoulder pain since a fall last night. Initial encounter. EXAM: LEFT SHOULDER - 2+ VIEW COMPARISON:  None. FINDINGS: There is no acute bony or joint abnormality. Remote healed distal clavicle fracture is noted. Acromioclavicular osteoarthritis is seen. Soft tissues are unremarkable. IMPRESSION: No acute finding. Remote healed distal clavicle fracture. Acromioclavicular osteoarthritis. Electronically Signed   By: Inge Rise M.D.   On: 02/24/2018 08:47   Ir Exchange Biliary Drain  Result Date: 02/03/2018 INDICATION: Cholangiocarcinoma, chronic indwelling internal external bilateral biliary drains EXAM: Fluoroscopic exchange of the bilateral 10 [place] internal external biliary drains MEDICATIONS: 400 mg Cipro; The antibiotic was administered within an appropriate time frame prior to the initiation of the procedure. ANESTHESIA/SEDATION: Moderate (conscious) sedation was employed during this procedure. A total of Versed 2.0 mg and Fentanyl 100 mcg was administered intravenously. Moderate Sedation Time: 10 minutes. The patient's level of consciousness and vital signs were monitored continuously by radiology nursing throughout the procedure under my direct supervision. FLUOROSCOPY TIME:   Fluoroscopy Time: 2 minutes 48 seconds (102 mGy). COMPLICATIONS: None immediate. PROCEDURE: Informed written consent was obtained from the patient after a thorough discussion of the procedural risks, benefits and alternatives. All questions were addressed. Maximal Sterile Barrier Technique was utilized including caps, mask, sterile gowns, sterile gloves, sterile drape, hand hygiene and skin antiseptic. A timeout was performed prior to the initiation of the procedure. Under sterile conditions, the existing bilateral 10 [place] internal external biliary drains were removed over Bentson guidewires. New 10 French drains were advanced. Retention loops formed in the duodenum. Contrast injection confirms position and adequate biliary drainage. No immediate complication. Catheters secured with a StatLock devices. External caps applied. Patient was given gravity drainage packs for home use if needed. No immediate complication. Patient tolerated the procedure well. IMPRESSION: Successful fluoroscopic exchange of the bilateral 10 [place] internal external biliary drains. Electronically Signed  By: Eugenie Filler M.D.   On: 02/03/2018 16:40   Ir Exchange Biliary Drain  Result Date: 02/03/2018 INDICATION: Cholangiocarcinoma, chronic indwelling internal external bilateral biliary drains EXAM: Fluoroscopic exchange of the bilateral 10 [place] internal external biliary drains MEDICATIONS: 400 mg Cipro; The antibiotic was administered within an appropriate time frame prior to the initiation of the procedure. ANESTHESIA/SEDATION: Moderate (conscious) sedation was employed during this procedure. A total of Versed 2.0 mg and Fentanyl 100 mcg was administered intravenously. Moderate Sedation Time: 10 minutes. The patient's level of consciousness and vital signs were monitored continuously by radiology nursing throughout the procedure under my direct supervision. FLUOROSCOPY TIME:  Fluoroscopy Time: 2 minutes 48 seconds (102 mGy).  COMPLICATIONS: None immediate. PROCEDURE: Informed written consent was obtained from the patient after a thorough discussion of the procedural risks, benefits and alternatives. All questions were addressed. Maximal Sterile Barrier Technique was utilized including caps, mask, sterile gowns, sterile gloves, sterile drape, hand hygiene and skin antiseptic. A timeout was performed prior to the initiation of the procedure. Under sterile conditions, the existing bilateral 10 [place] internal external biliary drains were removed over Bentson guidewires. New 10 French drains were advanced. Retention loops formed in the duodenum. Contrast injection confirms position and adequate biliary drainage. No immediate complication. Catheters secured with a StatLock devices. External caps applied. Patient was given gravity drainage packs for home use if needed. No immediate complication. Patient tolerated the procedure well. IMPRESSION: Successful fluoroscopic exchange of the bilateral 10 [place] internal external biliary drains. Electronically Signed   By: Jerilynn Mages.  Shick M.D.   On: 02/03/2018 16:40   Ir Paracentesis  Result Date: 02/25/2018 INDICATION: Patient with history of metastatic cholangiocarcinoma, ETOH cirrhosis, currently admitted for new onset seizures and upper GI bleeding. Imaging shows new onset ascites in setting of leukocytosis - request for diagnostic paracentesis to r/o SBP. Ordering provider has requested a 2 L maximum. EXAM: ULTRASOUND GUIDED DIAGNOSTIC PARACENTESIS MEDICATIONS: 10 mL 1% lidocaine. COMPLICATIONS: None immediate. PROCEDURE: Informed written consent was obtained from the patient after a discussion of the risks, benefits and alternatives to treatment. A timeout was performed prior to the initiation of the procedure. Initial ultrasound scanning demonstrates a moderate amount of ascites within the left lower abdominal quadrant. The left lower abdomen was prepped and draped in the usual sterile fashion. 1%  lidocaine was used for local anesthesia. Following this, a 19 gauge, 7-cm, Yueh catheter was introduced. An ultrasound image was saved for documentation purposes. The paracentesis was performed. The catheter was removed and a dressing was applied. The patient tolerated the procedure well without immediate post procedural complication. FINDINGS: A total of approximately 1.0 L of hazy yellow fluid was removed. Samples were sent to the laboratory as requested by the clinical team. IMPRESSION: Successful ultrasound-guided paracentesis yielding 1.0 liters of peritoneal fluid. Read by Candiss Norse, PA-C Electronically Signed   By: Aletta Edouard M.D.   On: 02/25/2018 13:56    Time Spent in minutes  30   Lala Lund M.D on 02/26/2018 at 10:32 AM  To page go to [email or web address] - password [email or web address]

## 2018-02-26 NOTE — Consult Note (Signed)
Neurology Consultation Reason for Consult: Possible seizure Referring Physician: Ronnie Derby  CC: Loss of consciousness  History is obtained from: Patient  HPI: Matthew Lewis is a 63 y.o. male who presented after having stood up to go to the bathroom.  He states that he felt "weak" as well as lightheaded.  He noticed his vision going down, but went to the bathroom and urinated, when he got up to leave, he had further lightheaded and fell down to the ground.  He then lost consciousness and there was report of possible "seizure activity."  In the ER he had coffee-ground emesis was found to be severely anemic with GI bleeding.    ROS: A 14 point ROS was performed and is negative except as noted in the HPI.   Past Medical History:  Diagnosis Date  . Abscess of anal and rectal regions    horse shoe abscess  . Alcoholic cirrhosis ([place])   . Arthritis    "everywhere; especially in my spine"  . Cirrhosis ([place])    STAGE 1 CIRRHOSIS PATIENT SEES DR NAMDIGAM FOR  . Colon cancer ([place]) 05/2005   TX SURGERY WITH LYMPH NODE REMOVAL  . Cough    LAST FEW WEEKS SAW DR RAMOS LUNGS MILKY SPUTUM OCC  . DM type 2 with diabetic peripheral neuropathy ([facility])    left foot  . Fatigue   . Fistula, anal    multiple  . History of substance abuse ([place])    last alcohol was 08/2011; last marijuania was 08/2011  . Hypertension   . PTSD (post-traumatic stress disorder)    "severe" HX OF  . Sleep deprivation      Family History  Problem Relation Age of Onset  . Heart disease Father   . Heart disease Mother   . Cancer Mother   . Cancer Sister   . Heart disease Brother      Social History:  reports that he quit smoking about 27 years ago. His smoking use included cigarettes. He has a 10.00 pack-year smoking history. He has never used smokeless tobacco. He reports current alcohol use. He reports previous drug use. Drugs: Oxycodone and Marijuana.   Exam: Current vital signs: BP 103/63 (BP Location: Right  Arm)   Pulse 89   Temp 98.5 F (36.9 C) (Oral)   Resp 18   Ht 6' (1.829 m)   Wt 88.9 kg   SpO2 100%   BMI 26.58 kg/m  Vital signs in last 24 hours: Temp:  [98.4 F (36.9 C)-101.4 F (38.6 C)] 98.5 F (36.9 C) (02/20 0900) Pulse Rate:  [89-111] 89 (02/20 0900) Resp:  [18-23] 18 (02/20 0900) BP: (102-108)/(50-66) 103/63 (02/20 0900) SpO2:  [98 %-100 %] 100 % (02/20 0900)   Physical Exam  Constitutional: Appears well-developed and well-nourished.  Psych: Affect appropriate to situation Eyes: No scleral injection HENT: No OP obstrucion Head: Normocephalic.  Cardiovascular: Normal rate and regular rhythm.  Respiratory: Effort normal, non-labored breathing GI: Soft.  No distension. There is no tenderness.  Skin: WDI  Neuro: Mental Status: Patient is awake, alert, oriented to person, place, month, year, and situation. Patient is able to give a clear and coherent history. No signs of aphasia or neglect Cranial Nerves: II: Visual Fields are full. Pupils are equal, round, and reactive to light.   III,IV, VI: EOMI without ptosis or diploplia.  V: Facial sensation is symmetric to temperature VII: Facial movement is symmetric.  VIII: hearing is intact to voice X: Uvula elevates symmetrically  XI: Shoulder shrug is symmetric. XII: tongue is midline without atrophy or fasciculations.  Motor: Tone is normal. Bulk is normal. 5/5 strength was present in all four extremities.  Sensory: Sensation is symmetric to light touch and temperature in the arms and legs. Deep Tendon Reflexes: 2+ and symmetric in the biceps and patellae.  Plantars: Toes are downgoing bilaterally.  Cerebellar: FNF and HKS are intact bilaterally   I have reviewed labs in epic and the results pertinent to this consultation are: Hemoglobin on arrival 5.5 CMP-sodium 132, glucose 174, creatinine 0.7  I have reviewed the images obtained: CT head-negative  Impression: 63 year old male with what I suspect is  convulsive syncope.  Even if this was a seizure, I would consider it a provoked seizure in the setting of severe anemia and cerebral hypoperfusion.  His EEG does not demonstrate any predisposition for seizure activity and therefore I would not start antiepileptics at this time.  Recommendations: 1) MRI brain pending 2) if MRI is negative, no further recommendations at this time.   Roland Rack, MD [facility] [phone]  If 7pm- 7am, please page neurology on call as listed in [place].

## 2018-02-26 NOTE — Plan of Care (Signed)

## 2018-02-26 NOTE — Progress Notes (Addendum)
Daily Rounding Note  02/26/2018, 8:22 AM  LOS: 2 days   SUBJECTIVE:   Chief complaint: duodenal ulcer, GIB.  Anemia.  Cholangiocarcinoma.   Hungry on full liquid diet.  No weakness, no dizziness.  Black stool yesterday, none in PM or this AM.  Feels well.  Bored.    OBJECTIVE:         Vital signs in last 24 hours:    Temp:  [98.4 F (36.9 C)-101.4 F (38.6 C)] 98.5 F (36.9 C) (02/20 0500) Pulse Rate:  [106-111] 109 (02/19 1603) Resp:  [18-23] 18 (02/19 1603) BP: (99-108)/(50-71) 104/61 (02/19 1603) SpO2:  [98 %-100 %] 98 % (02/19 1603) Last BM Date: 02/24/18 Filed Weights   02/24/18 0206 02/24/18 1631  Weight: 90.7 kg 88.9 kg   General: pleasant.  Looks well   Heart: RRR Chest: clear bil.   Abdomen: soft, NT, ND.  2 biliary drains in place.    Extremities: slight edema on left foot Neuro/Psych:  Oriented x 3.  Fully oriented and no deficits.    Intake/Output from previous day: 02/19 0701 - 02/20 0700 In: 1168 [I.V.:400; Blood:568; IV Piggyback:200] Out: 3600 [Urine:2800; Drains:800]  Intake/Output this shift: No intake/output data recorded.  Lab Results: Recent Labs    02/24/18 1902 02/25/18 0350 02/25/18 1459 02/25/18 2301 02/26/18 0430  WBC 22.3* 18.1*  --   --  14.2*  HGB 7.6* 7.0* 8.5* 8.6* 8.6*  HCT 23.6* 21.5* 25.0* 26.4* 25.9*  PLT 121* 116*  --   --  115*   BMET Recent Labs    02/24/18 0245  02/25/18 0350 02/25/18 1459 02/26/18 0430  NA 133*   < > 133* 134* 132*  K 4.2   < > 4.1 4.2 3.5  CL 106  --  107  --  103  CO2 14*  --  22  --  22  GLUCOSE 193*  --  186* 191* 174*  BUN 28*  --  18  --  12  CREATININE 0.90  --  0.74  --  0.71  CALCIUM 8.4*  --  8.0*  --  7.7*   < > = values in this interval not displayed.   LFT Recent Labs    02/24/18 0245 02/25/18 0350 02/26/18 0430  PROT 5.7* 5.1* 4.9*  ALBUMIN 2.3* 2.1* 2.0*  AST 42* 701* 398*  ALT 41 800* 736*  ALKPHOS 306*  293* 263*  BILITOT 1.0 1.2 2.1*   PT/INR Recent Labs    02/24/18 0245  LABPROT 16.1*  INR 1.31   Hepatitis Panel No results for input(s): HEPBSAG, HCVAB, HEPAIGM, HEPBIGM in the last 72 hours.  Studies/Results: Dg Shoulder Left  Result Date: 02/24/2018 CLINICAL DATA:  Left shoulder pain since a fall last night. Initial encounter. EXAM: LEFT SHOULDER - 2+ VIEW COMPARISON:  None. FINDINGS: There is no acute bony or joint abnormality. Remote healed distal clavicle fracture is noted. Acromioclavicular osteoarthritis is seen. Soft tissues are unremarkable. IMPRESSION: No acute finding. Remote healed distal clavicle fracture. Acromioclavicular osteoarthritis. Electronically Signed   By: Inge Rise M.D.   On: 02/24/2018 08:47   Ir Paracentesis  Result Date: 02/25/2018 INDICATION: Patient with history of metastatic cholangiocarcinoma, ETOH cirrhosis, currently admitted for new onset seizures and upper GI bleeding. Imaging shows new onset ascites in setting of leukocytosis - request for diagnostic paracentesis to r/o SBP. Ordering provider has requested a 2 L maximum. EXAM: ULTRASOUND GUIDED DIAGNOSTIC PARACENTESIS MEDICATIONS: 10  mL 1% lidocaine. COMPLICATIONS: None immediate. PROCEDURE: Informed written consent was obtained from the patient after a discussion of the risks, benefits and alternatives to treatment. A timeout was performed prior to the initiation of the procedure. Initial ultrasound scanning demonstrates a moderate amount of ascites within the left lower abdominal quadrant. The left lower abdomen was prepped and draped in the usual sterile fashion. 1% lidocaine was used for local anesthesia. Following this, a 19 gauge, 7-cm, Yueh catheter was introduced. An ultrasound image was saved for documentation purposes. The paracentesis was performed. The catheter was removed and a dressing was applied. The patient tolerated the procedure well without immediate post procedural complication.  FINDINGS: A total of approximately 1.0 L of hazy yellow fluid was removed. Samples were sent to the laboratory as requested by the clinical team. IMPRESSION: Successful ultrasound-guided paracentesis yielding 1.0 liters of peritoneal fluid. Read by Candiss Norse, PA-C Electronically Signed   By: Aletta Edouard M.D.   On: 02/25/2018 13:56   Scheduled Meds: . fentaNYL (SUBLIMAZE) injection  50 mcg Intravenous Once  . [START ON 02/27/2018] pantoprazole  40 mg Intravenous Q12H  . potassium chloride  40 mEq Oral Once   Continuous Infusions: . ceFEPime (MAXIPIME) IV 2 g (02/26/18 0449)  . metronidazole 500 mg (02/26/18 0247)  . octreotide  (SANDOSTATIN)    IV infusion 50 mcg/hr (02/26/18 0451)  . pantoprozole (PROTONIX) infusion 8 mg/hr (02/26/18 0450)   PRN Meds:.acetaminophen, diphenhydrAMINE, fentaNYL (SUBLIMAZE) injection, lidocaine (PF), LORazepam, LORazepam, [DISCONTINUED] ondansetron **OR** ondansetron (ZOFRAN) IV, sodium chloride flush   ASSESMENT:   *   Upper GI bleed.   02/25/18 EGD: non-bleeding grade 2 gastric varices with no stigmata of bleeding.  Portal gastropathy.  2 stents, on with visible retention suture, in duodenum.  Non-bleeding duodenal erosions.  Non-bleeding, superficial, duodenal ulcer (likely source of bleeding). Day 3 Octreotide (finishes 2/21 at 1030) and PPI (finishes tomorrow @ [phone]) drips.   Celebrex and No PPI PTA.    *   Cholangiocarcinoma with mets. S/p int/ext biliary stents to left hepatic and right hepatic systems for obstructive jaundice.  Chemo started 12/18/17.   LFTs elevated but improved.    *   Blood loss anemia. Nadir Hgb 5.5 >> 8.6.  S/p 5 total U PRBCs, last 2 on 2/19.    *   Ascites.  S/p 1 liter paracentesis 02/25/18:  No SBP    *   Seizure a/w syncope.  CT head negative.  MRI brain pending.       PLAN   *   Advanced to carb mod diet.  Stop Octreotide now and switch to oral Protonix now.     *  Do not restart Celebrex or other NSAIDs.     *  Not clear he still needs abx, will defer to hospitalist.     Azucena Freed  02/26/2018, 8:22 AM Phone [phone]     Attending Physician Note   I have taken an interval history, reviewed the chart and examined the patient. I agree with the Advanced Practitioner's note, impression and recommendations.   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

## 2018-02-26 NOTE — Procedures (Signed)
History: 63 year old male being evaluated for transient episode of syncope  Sedation: None  Technique: This is a 21 channel routine scalp EEG performed at the bedside with bipolar and monopolar montages arranged in accordance to the international 10/20 system of electrode placement. One channel was dedicated to EKG recording.    Background: There is a posterior dominant rhythm of 9 Hz which is well organized with a normal-appearing waking background during some of the EEG.  At other times, despite no change in the patient's exam or patient state, there is diffusely distributed, though most prominent frontal centrally, four hertz rhythmic smooth or arciform delta activity throughout much of the recording.  This does at times appear to respond to various activating procedures including eye opening/closure, perform motor tasks.  There is no evolution to this activity.  I suspect that this most likely represents a slow ciganek rhythm.  FIRDA would be another consideration, but it is more sustained and the reactivity to various activating features would be unusual.  Photic stimulation: Physiologic driving is not performed  EEG Abnormalities: 1) diffuse slow activity which I think is most consistent with a slow ciganek rhythm(normal variant)  Clinical Interpretation: This EEG most likely represents a normal variant, the possibility of a mild encephalopathy is there but I feel less likely.   There was no seizure or seizure predisposition recorded on this study. Please note that lack of epileptiform activity on EEG does not preclude the possibility of epilepsy.   Roland Rack, MD [facility] [phone]  If 7pm- 7am, please page neurology on call as listed in [place].

## 2018-02-26 NOTE — Progress Notes (Signed)
EEG completed, results pending. 

## 2018-02-27 ENCOUNTER — Ambulatory Visit: Payer: Medicare Other

## 2018-02-27 LAB — COMPREHENSIVE METABOLIC PANEL
ALT: 452 U/L — ABNORMAL HIGH (ref 0–44)
ANION GAP: 5 (ref 5–15)
AST: 123 U/L — ABNORMAL HIGH (ref 15–41)
Albumin: 2 g/dL — ABNORMAL LOW (ref 3.5–5.0)
Alkaline Phosphatase: 229 U/L — ABNORMAL HIGH (ref 38–126)
BUN: 13 mg/dL (ref 8–23)
CO2: 24 mmol/L (ref 22–32)
Calcium: 8 mg/dL — ABNORMAL LOW (ref 8.9–10.3)
Chloride: 105 mmol/L (ref 98–111)
Creatinine, Ser: 0.71 mg/dL (ref 0.61–1.24)
GFR calc non Af Amer: >60 mL/min (ref >60)
Glucose, Bld: 147 mg/dL — ABNORMAL HIGH (ref 70–99)
Potassium: 3.6 mmol/L (ref 3.5–5.1)
Sodium: 134 mmol/L — ABNORMAL LOW (ref 135–145)
Total Bilirubin: 1.5 mg/dL — ABNORMAL HIGH (ref 0.3–1.2)
Total Protein: 5.2 g/dL — ABNORMAL LOW (ref 6.5–8.1)

## 2018-02-27 LAB — CBC WITH DIFFERENTIAL/PLATELET
Abs Immature Granulocytes: 0.35 10*3/uL — ABNORMAL HIGH (ref 0.00–0.07)
BASOS PCT: 0 %
Basophils Absolute: 0 10*3/uL (ref 0.0–0.1)
EOS ABS: 0.2 10*3/uL (ref 0.0–0.5)
Eosinophils Relative: 1 %
HCT: 26.3 % — ABNORMAL LOW (ref 39.0–52.0)
Hemoglobin: 8.7 g/dL — ABNORMAL LOW (ref 13.0–17.0)
Immature Granulocytes: 2 %
Lymphocytes Relative: 7 %
Lymphs Abs: 1 10*3/uL (ref 0.7–4.0)
MCH: 31.4 pg (ref 26.0–34.0)
MCHC: 33.1 g/dL (ref 30.0–36.0)
MCV: 94.9 fL (ref 80.0–100.0)
MONOS PCT: 11 %
Monocytes Absolute: 1.7 10*3/uL — ABNORMAL HIGH (ref 0.1–1.0)
NEUTROS PCT: 79 %
Neutro Abs: 12.1 10*3/uL — ABNORMAL HIGH (ref 1.7–7.7)
Platelets: 136 10*3/uL — ABNORMAL LOW (ref 150–400)
RBC: 2.77 MIL/uL — ABNORMAL LOW (ref 4.22–5.81)
RDW: 18 % — ABNORMAL HIGH (ref 11.5–15.5)
WBC: 15.3 10*3/uL — ABNORMAL HIGH (ref 4.0–10.5)
nRBC: 0 % (ref 0.0–0.2)

## 2018-02-27 MED ORDER — ASPIRIN EC 81 MG PO TBEC: 81.0000 mg | DELAYED_RELEASE_TABLET | Freq: Every day | ORAL | Status: DC

## 2018-02-27 MED ORDER — PANTOPRAZOLE SODIUM 40 MG PO TBEC: 40.0000 mg | DELAYED_RELEASE_TABLET | Freq: Two times a day (BID) | ORAL | 0 refills | Status: DC

## 2018-02-27 MED ORDER — PROPRANOLOL HCL ER 60 MG PO CP24: 60.0000 mg | ORAL_CAPSULE | Freq: Every day | ORAL | 0 refills | Status: DC

## 2018-02-27 MED ORDER — HEPARIN SOD (PORK) LOCK FLUSH 100 UNIT/ML IV SOLN
500.0000 [IU] | INTRAVENOUS | Status: AC | PRN
  Administered 2018-02-27: 500 [IU]

## 2018-02-27 NOTE — Progress Notes (Signed)
PAC deaccessed by IV team. Pt has had biliary drain prior to this admit and knows how to care for and empty. Discharge instructions reviewed with pt and wife.  Copy of instructions and scripts given to pt.  Pt d/c'd via wheelchair with belongings, with wife.            Escorted by unit NT .

## 2018-02-27 NOTE — Discharge Instructions (Signed)
Follow with Primary MD [name], MD in 7 days   Get CBC, CMP  checked  by Primary MD  in 5-7 days    Activity: As tolerated with Full fall precautions use walker/cane & assistance as needed  Disposition Home    Diet: Heart Healthy Low Carb, 1.5lit/day fluid restriction  Special Instructions: If you have smoked or chewed Tobacco  in the last 2 yrs please stop smoking, stop any regular Alcohol  and or any Recreational drug use.  On your next visit with your primary care physician please Get Medicines reviewed and adjusted.  Please request your Prim.MD to go over all Hospital Tests and Procedure/Radiological results at the follow up, please get all Hospital records sent to your Prim MD by signing hospital release before you go home.  If you experience worsening of your admission symptoms, develop shortness of breath, life threatening emergency, suicidal or homicidal thoughts you must seek medical attention immediately by calling 911 or calling your MD immediately  if symptoms less severe.  You Must read complete instructions/literature along with all the possible adverse reactions/side effects for all the Medicines you take and that have been prescribed to you. Take any new Medicines after you have completely understood and accpet all the possible adverse reactions/side effects.

## 2018-02-27 NOTE — Discharge Summary (Signed)
Matthew Lewis KCL:[id number] DOB: [birth year] DOA: 02/24/2018  PCP: [name], MD  Admit date: 02/24/2018  Discharge date: 02/27/2018  Admitted From: Home   Disposition:  Hom   Recommendations for Outpatient Follow-up:   Follow up with PCP in 1-2 weeks  PCP Please obtain BMP/CBC, 2 view CXR in 1week,  (see Discharge instructions)   PCP Please follow up on the following pending results:    Home Health:  None Equipment/Devices: None  Consultations: GI,Onco Discharge Condition: Fair   CODE STATUS: Full(changed)   Diet Recommendation: Heart Healthy Low Carb     Chief Complaint  Patient presents with  . Fall  . Seizures     Brief history of present illness from the day of admission and additional interim summary    Matthew H Gardneris a 63 y.o.malewith medical history significant of anal/rectal abscesses, alcoholic cirrhosis, history of substance abuse, PTSD, hypertension, type 2 diabetes, diabetic peripheral neuropathy, history of colon cancer, history of colectomy with lymph node removal, metastatic colon carcinoma who is brought to the emergency department after having sudden onset of blindness for about 10 minutes followed by disorientation. His wife told the EMS crew that she witnessed generalized seizure activity. The patient states that he has had insomnia for the past 2 days. He went to sleep around 2200 last night and slept for a solid hour. He woke up to go to the bathroom and blacked out. He woke up and crawled to the bathroom and urinated on himself. He states that he fell again on the floor and hit his head. His wife asked his son to call 911. Once in the ER, the patient had an episode of coffee-ground emesis that was positive for occult blood.                                 Hospital Course    Acute UGI bleed with Acute Blood loss related Anemia - s/p 2 unit of packed RBC transfusion, posttransfusion H&H stable, on IV PPI and octreotide drip, EGD showing nonbleeding varices.    Per GI he has been advanced to soft diet, no further signs of bleeding, H&H stable, will be discharged home on p.o PPI and long-acting beta-blocker with outpatient GI follow-up, he likely has portal hypertension induced by metastatic cholangiocarcinoma.  Cholangiocarcinoma ([place]) - Per Onc on Pall Chemo, Continue daily drainage of biliary drain exchange.  Long-term prognosis is poor previously he was DNR but now wants to be full code.  Still has evidence of portal hypertension ascites, ultrasound-guided paracentesis was ordered by GI which was done on 02/25/2018 with 1 L of straw fluid removed.  No signs of SBP.  Lactic acidosis.  Due to dehydration.  No sepsis.  Monitor with supportive care.  No clinical signs of infection will hold on antibiotics except cefepime which we will continue till cleared from GI standpoint.  Hyperlipemia - Not on medical therapy.  CAD (coronary artery disease) -  Denies  chest pain. Hold aspirin.  ? seizure activity.    MRI brain nonacute, EEG stable but with nonspecific changes.  Seen by neurology thought to have had syncopal episode rather than seizure episode.  No changes.  Type 2 diabetes mellitus ([place]) - Home regimen to be commenced.   Discharge diagnosis     Principal Problem:   UGI bleed Active Problems:   Hyperlipemia   Type 2 diabetes mellitus (HCC)   CAD (coronary artery disease)- PCI 2001- no evaluation since   Hyponatremia   Cholangiocarcinoma ([facility])   Lactic acidosis   Acute blood loss anemia    Discharge instructions    Discharge Instructions    Discharge instructions   Complete by:  As directed    Follow with Primary MD [name], MD in 7 days   Get CBC, CMP  checked  by Primary MD  in  5-7 days    Activity: As tolerated with Full fall precautions use walker/cane & assistance as needed  Disposition Home    Diet: Heart Healthy Low Carb, 1.5lit/day fluid restriction  Special Instructions: If you have smoked or chewed Tobacco  in the last 2 yrs please stop smoking, stop any regular Alcohol  and or any Recreational drug use.  On your next visit with your primary care physician please Get Medicines reviewed and adjusted.  Please request your Prim.MD to go over all Hospital Tests and Procedure/Radiological results at the follow up, please get all Hospital records sent to your Prim MD by signing hospital release before you go home.  If you experience worsening of your admission symptoms, develop shortness of breath, life threatening emergency, suicidal or homicidal thoughts you must seek medical attention immediately by calling 911 or calling your MD immediately  if symptoms less severe.  You Must read complete instructions/literature along with all the possible adverse reactions/side effects for all the Medicines you take and that have been prescribed to you. Take any new Medicines after you have completely understood and accpet all the possible adverse reactions/side effects.   Increase activity slowly   Complete by:  As directed       Discharge Medications   Allergies as of 02/27/2018      Reactions   Morphine And Related Other (See Comments)   Patient is recovering from drug addiction and wants to avoid any narcotics      Medication List    STOP taking these medications   celecoxib 200 MG capsule Commonly known as:  CELEBREX     TAKE these medications   aspirin EC 81 MG tablet Take 1 tablet (81 mg total) by mouth daily. Start taking on:  March 03, 2018 What changed:  These instructions start on March 03, 2018. If you are unsure what to do until then, ask your doctor or other care provider.   fexofenadine 180 MG tablet Commonly known as:  ALLEGRA Take  180 mg by mouth at bedtime.   gabapentin 400 MG capsule Commonly known as:  NEURONTIN Take 400-800 mg by mouth See admin instructions. Take 400 mg by mouth in the morning and 800 mg in the evening   lactose free nutrition Liqd Take 237 mLs by mouth 3 (three) times daily with meals. What changed:  additional instructions   LEVEMIR 100 UNIT/ML injection Generic drug:  insulin detemir Inject 10-62 Units into the skin See admin instructions. Take 62 in the am and 10 in the pm   lidocaine-prilocaine cream Commonly known as:  EMLA Apply to port  site 1 hour prior to access What changed:    how much to take  how to take this  when to take this   multivitamin tablet Take 1 tablet by mouth daily.   pantoprazole 40 MG tablet Commonly known as:  PROTONIX Take 1 tablet (40 mg total) by mouth 2 (two) times daily.   propranolol ER 60 MG 24 hr capsule Commonly known as:  INDERAL LA Take 1 capsule (60 mg total) by mouth daily for 30 days.   ROLAIDS PO Take 1-2 tablets by mouth 2 (two) times daily as needed (heartburn).   tamsulosin 0.4 MG Caps capsule Commonly known as:  FLOMAX TAKE 1 CAPSULE(0.4 MG) BY MOUTH DAILY AFTER SUPPER What changed:  See the new instructions.       Follow-up Information    [name], MD. Schedule an appointment as soon as possible for a visit in 1 week(s).   Specialty:  Internal Medicine Why:  along with your Oncologist in 1 week Contact information: [address] [place] [address] [phone]           Major procedures and Radiology Reports - PLEASE review detailed and final reports thoroughly  -        Ct Head Wo Contrast  Result Date: 02/24/2018 CLINICAL DATA:  Initial evaluation for acute posttraumatic headache. EXAM: CT HEAD WITHOUT CONTRAST TECHNIQUE: Contiguous axial images were obtained from the base of the skull through the vertex without intravenous contrast. COMPARISON:  Prior CT from 10/09/2010  FINDINGS: Brain: Cerebral volume within normal limits for patient age. No evidence for acute intracranial hemorrhage. No findings to suggest acute large vessel territory infarct. No mass lesion, midline shift, or mass effect. Ventricles are normal in size without evidence for hydrocephalus. No extra-axial fluid collection identified. Vascular: No hyperdense vessel identified. Skull: Scalp soft tissues demonstrate no acute abnormality. Calvarium intact. Sinuses/Orbits: Globes and orbital soft tissues within normal limits. Visualized paranasal sinuses are clear. No mastoid effusion. IMPRESSION: Negative head CT.  No acute intracranial abnormality. Electronically Signed   By: Jeannine Boga M.D.   On: 02/24/2018 05:52   Ct Chest W Contrast  Result Date: 02/19/2018 CLINICAL DATA:  Patient with metastatic intrahepatic cholangiocarcinoma. Follow-up exam. EXAM: CT CHEST, ABDOMEN, AND PELVIS WITH CONTRAST TECHNIQUE: Multidetector CT imaging of the chest, abdomen and pelvis was performed following the standard protocol during bolus administration of intravenous contrast. CONTRAST:  12m OMNIPAQUE IOHEXOL 300 MG/ML  SOLN COMPARISON:  CT chest 10/27/2017; CT abdomen pelvis 12/22/2017 FINDINGS: CT CHEST FINDINGS Cardiovascular: Right anterior chest wall Port-A-Cath is present with tip terminating in the superior vena cava. Normal heart size. Trace pericardial effusion. Coronary arterial and thoracic aortic vascular calcifications. Mediastinum/Nodes: Unchanged 1.1 cm right paratracheal lymph node (image 23; series 2). Interval decrease in size of 1.5 cm subcarinal node (image 30; series 2). Similar-appearing 1.5 cm right hilar node (image 34; series 2). Normal appearance of the esophagus. Lungs/Pleura: Central airways are patent. Similar-appearing 2.9 cm left lower lobe nodule (image 113; series 7). Similar-appearing moderate-sized area of consolidation within the right lower lung (image 43; series 2).  Similar-appearing peripheral subpleural reticular opacities. No pleural effusion or pneumothorax. Musculoskeletal: Thoracic spine degenerative changes. No aggressive or acute appearing osseous lesions. CT ABDOMEN PELVIS FINDINGS Hepatobiliary: Persistent percutaneous internal external biliary drain traversing the right hepatic lobe terminating in the descending duodenum. Interval insertion of percutaneous biliary drain into the left hepatic lobe with new pneumobilia within the right and left hepatic lobes. Re-demonstrated  cirrhotic morphology of the liver. Ill-defined low-attenuation infiltrative mass within the left hepatic lobe (image 53; series 2) is grossly similar measuring 5.6 x 4.8 cm. Grossly similar appearing low-attenuation lesions within the right hepatic lobe. Similar-appearing 3.8 cm subcapsular right hepatic lobe lesion (image 48; series 2). Thrombosis of the left portal vein. Pancreas: Unremarkable Spleen: Enlarged measuring 16 cm. Adrenals/Urinary Tract: Similar-appearing right adrenal mass measuring up to 4.9 cm. Slight interval increase in size of left adrenal mass measuring 2.7 cm (image 64; series 2). Kidneys enhance symmetrically with contrast. Re-demonstrated 2 mm stone inferior pole left kidney. Urinary bladder is unremarkable. Stomach/Bowel: Large amount of stool throughout the colon. No evidence for small bowel obstruction. Re-demonstrated mildly distended stool-filled descending colon (image 90; series 2). Vascular/Lymphatic: Unchanged 1.8 cm aortocaval node (image 75; series 2). Unchanged 1.5 cm left periaortic lymph node (image 75; series 2). Normal caliber abdominal aorta. Peripheral calcified atherosclerotic plaque. Reproductive: Heterogeneous prostate. Other: Interval development of moderate volume ascites throughout the abdomen. No free intraperitoneal air. Musculoskeletal: Lower thoracic and lumbar spine degenerative changes. IMPRESSION: 1. Slight interval increase in size of left  adrenal gland metastasis. Similar-appearing right adrenal gland metastasis. 2. While difficult to measure given ill-defined nature, infiltrative mass within the left hepatic lobe is grossly similar when compared to prior exam. Additional lesions within the liver are grossly similar. 3. Interval insertion of biliary drain within the left hepatic lobe with resultant decompression of the left hepatic lobe biliary system. 4. Similar-appearing sharply marginated consolidation within the right lower lobe which may represent infectious/inflammatory process or metastatic disease. 5. Large amount of stool throughout the colon as can be seen with constipation. 6. Persistent left hepatic lobe portal venous thrombosis. 7. Splenomegaly. 8. New moderate volume ascites. Electronically Signed   By: Lovey Newcomer M.D.   On: 02/19/2018 14:05   Mr Brain Wo Contrast  Result Date: 02/26/2018 CLINICAL DATA:  Transient blindness, seizure activity. History of metastatic colon cancer, cirrhosis, substance abuse, hypertension, diabetes. EXAM: MRI HEAD WITHOUT CONTRAST TECHNIQUE: Multiplanar, multiecho pulse sequences of the brain and surrounding structures were obtained without intravenous contrast. COMPARISON:  CT HEAD February 24, 2018. FINDINGS: INTRACRANIAL CONTENTS: No reduced diffusion to suggest acute ischemia or status epilepticus. No susceptibility artifact to suggest hemorrhage. No parenchymal brain volume loss for age. No hydrocephalus. Faint supratentorial and pontine white matter FLAIR T2 hyperintensities most commonly seen with chronic small vessel ischemic changes, normal for age. No suspicious parenchymal signal, masses, mass effect. No abnormal extra-axial fluid collections. No extra-axial masses. VASCULAR: Normal major intracranial vascular flow voids present at skull base. SKULL AND UPPER CERVICAL SPINE: No abnormal sellar expansion. No suspicious calvarial bone marrow signal. Craniocervical junction maintained.  SINUSES/ORBITS: The mastoid air-cells and included paranasal sinuses are well-aerated.The included ocular globes and orbital contents are non-suspicious. OTHER: None. IMPRESSION: Negative noncontrast MRI head for age. Electronically Signed   By: Elon Alas M.D.   On: 02/26/2018 18:52   Ct Abdomen Pelvis W Contrast  Result Date: 02/19/2018 CLINICAL DATA:  Patient with metastatic intrahepatic cholangiocarcinoma. Follow-up exam. EXAM: CT CHEST, ABDOMEN, AND PELVIS WITH CONTRAST TECHNIQUE: Multidetector CT imaging of the chest, abdomen and pelvis was performed following the standard protocol during bolus administration of intravenous contrast. CONTRAST:  126m OMNIPAQUE IOHEXOL 300 MG/ML  SOLN COMPARISON:  CT chest 10/27/2017; CT abdomen pelvis 12/22/2017 FINDINGS: CT CHEST FINDINGS Cardiovascular: Right anterior chest wall Port-A-Cath is present with tip terminating in the superior vena cava. Normal heart size. Trace pericardial effusion. Coronary  arterial and thoracic aortic vascular calcifications. Mediastinum/Nodes: Unchanged 1.1 cm right paratracheal lymph node (image 23; series 2). Interval decrease in size of 1.5 cm subcarinal node (image 30; series 2). Similar-appearing 1.5 cm right hilar node (image 34; series 2). Normal appearance of the esophagus. Lungs/Pleura: Central airways are patent. Similar-appearing 2.9 cm left lower lobe nodule (image 113; series 7). Similar-appearing moderate-sized area of consolidation within the right lower lung (image 43; series 2). Similar-appearing peripheral subpleural reticular opacities. No pleural effusion or pneumothorax. Musculoskeletal: Thoracic spine degenerative changes. No aggressive or acute appearing osseous lesions. CT ABDOMEN PELVIS FINDINGS Hepatobiliary: Persistent percutaneous internal external biliary drain traversing the right hepatic lobe terminating in the descending duodenum. Interval insertion of percutaneous biliary drain into the left hepatic  lobe with new pneumobilia within the right and left hepatic lobes. Re-demonstrated cirrhotic morphology of the liver. Ill-defined low-attenuation infiltrative mass within the left hepatic lobe (image 53; series 2) is grossly similar measuring 5.6 x 4.8 cm. Grossly similar appearing low-attenuation lesions within the right hepatic lobe. Similar-appearing 3.8 cm subcapsular right hepatic lobe lesion (image 48; series 2). Thrombosis of the left portal vein. Pancreas: Unremarkable Spleen: Enlarged measuring 16 cm. Adrenals/Urinary Tract: Similar-appearing right adrenal mass measuring up to 4.9 cm. Slight interval increase in size of left adrenal mass measuring 2.7 cm (image 64; series 2). Kidneys enhance symmetrically with contrast. Re-demonstrated 2 mm stone inferior pole left kidney. Urinary bladder is unremarkable. Stomach/Bowel: Large amount of stool throughout the colon. No evidence for small bowel obstruction. Re-demonstrated mildly distended stool-filled descending colon (image 90; series 2). Vascular/Lymphatic: Unchanged 1.8 cm aortocaval node (image 75; series 2). Unchanged 1.5 cm left periaortic lymph node (image 75; series 2). Normal caliber abdominal aorta. Peripheral calcified atherosclerotic plaque. Reproductive: Heterogeneous prostate. Other: Interval development of moderate volume ascites throughout the abdomen. No free intraperitoneal air. Musculoskeletal: Lower thoracic and lumbar spine degenerative changes. IMPRESSION: 1. Slight interval increase in size of left adrenal gland metastasis. Similar-appearing right adrenal gland metastasis. 2. While difficult to measure given ill-defined nature, infiltrative mass within the left hepatic lobe is grossly similar when compared to prior exam. Additional lesions within the liver are grossly similar. 3. Interval insertion of biliary drain within the left hepatic lobe with resultant decompression of the left hepatic lobe biliary system. 4. Similar-appearing  sharply marginated consolidation within the right lower lobe which may represent infectious/inflammatory process or metastatic disease. 5. Large amount of stool throughout the colon as can be seen with constipation. 6. Persistent left hepatic lobe portal venous thrombosis. 7. Splenomegaly. 8. New moderate volume ascites. Electronically Signed   By: Lovey Newcomer M.D.   On: 02/19/2018 14:05   Ir Catheter Tube Change  Result Date: 02/19/2018 INDICATION: Biliary obstruction, retracted mild position left internal external biliary drain EXAM: FLUOROSCOPIC EXCHANGE AND REPOSITION OF THE LEFT INTERNAL EXTERNAL BILIARY DRAIN MEDICATIONS: NONE. ANESTHESIA/SEDATION: None. FLUOROSCOPY TIME:  Fluoroscopy Time: 1 minutes 36 seconds (49 mGy). COMPLICATIONS: None immediate. PROCEDURE: Informed written consent was obtained from the patient after a thorough discussion of the procedural risks, benefits and alternatives. All questions were addressed. Maximal Sterile Barrier Technique was utilized including caps, mask, sterile gowns, sterile gloves, sterile drape, hand hygiene and skin antiseptic. A timeout was performed prior to the initiation of the procedure. Under sterile conditions, the existing retracted left internal external biliary drain was removed over a Bentson guidewire. Kumpe catheter advanced. Limited cholangiogram performed. Biliary confluence identified. Catheter and guidewire access manipulated through the left biliary tree into the  duodenum. Contrast injection confirms position. New 10 [place] internal external biliary drain and advanced. Retention loop formed in the duodenum. Contrast injection confirms position. Catheter secured with a StatLock device and a dressing. No immediate complication. Patient tolerated the procedure well. IMPRESSION: Successful fluoroscopic 10 French left internal external biliary drain reposition and exchange. Electronically Signed   By: Jerilynn Mages.  Shick M.D.   On: 02/19/2018 16:24   Dg Chest  Port 1 View  Result Date: 02/24/2018 CLINICAL DATA:  63 year old male with hypotension, possible seizure. Metastatic cholangiocarcinoma. EXAM: PORTABLE CHEST 1 VIEW COMPARISON:  Chest CT 02/19/2018. 12/03/2017 radiographs and earlier. FINDINGS: Portable AP semi upright view at 0220 hours. Right chest power port now in place. Mediastinal contours remain normal. Left lung base pulmonary nodule is subtle radiographically. Other lung markings appear stable. No pneumothorax, pulmonary edema, pleural effusion or new pulmonary opacity. Partially visible biliary drains. IMPRESSION: No new cardiopulmonary abnormality. Left lower lobe pulmonary metastasis better demonstrated by CT. Electronically Signed   By: Genevie Ann M.D.   On: 02/24/2018 03:40   Dg Shoulder Left  Result Date: 02/24/2018 CLINICAL DATA:  Left shoulder pain since a fall last night. Initial encounter. EXAM: LEFT SHOULDER - 2+ VIEW COMPARISON:  None. FINDINGS: There is no acute bony or joint abnormality. Remote healed distal clavicle fracture is noted. Acromioclavicular osteoarthritis is seen. Soft tissues are unremarkable. IMPRESSION: No acute finding. Remote healed distal clavicle fracture. Acromioclavicular osteoarthritis. Electronically Signed   By: Inge Rise M.D.   On: 02/24/2018 08:47   Ir Exchange Biliary Drain  Result Date: 02/03/2018 INDICATION: Cholangiocarcinoma, chronic indwelling internal external bilateral biliary drains EXAM: Fluoroscopic exchange of the bilateral 10 [place] internal external biliary drains MEDICATIONS: 400 mg Cipro; The antibiotic was administered within an appropriate time frame prior to the initiation of the procedure. ANESTHESIA/SEDATION: Moderate (conscious) sedation was employed during this procedure. A total of Versed 2.0 mg and Fentanyl 100 mcg was administered intravenously. Moderate Sedation Time: 10 minutes. The patient's level of consciousness and vital signs were monitored continuously by radiology  nursing throughout the procedure under my direct supervision. FLUOROSCOPY TIME:  Fluoroscopy Time: 2 minutes 48 seconds (102 mGy). COMPLICATIONS: None immediate. PROCEDURE: Informed written consent was obtained from the patient after a thorough discussion of the procedural risks, benefits and alternatives. All questions were addressed. Maximal Sterile Barrier Technique was utilized including caps, mask, sterile gowns, sterile gloves, sterile drape, hand hygiene and skin antiseptic. A timeout was performed prior to the initiation of the procedure. Under sterile conditions, the existing bilateral 10 [place] internal external biliary drains were removed over Bentson guidewires. New 10 French drains were advanced. Retention loops formed in the duodenum. Contrast injection confirms position and adequate biliary drainage. No immediate complication. Catheters secured with a StatLock devices. External caps applied. Patient was given gravity drainage packs for home use if needed. No immediate complication. Patient tolerated the procedure well. IMPRESSION: Successful fluoroscopic exchange of the bilateral 10 [place] internal external biliary drains. Electronically Signed   By: Jerilynn Mages.  Shick M.D.   On: 02/03/2018 16:40   Ir Exchange Biliary Drain  Result Date: 02/03/2018 INDICATION: Cholangiocarcinoma, chronic indwelling internal external bilateral biliary drains EXAM: Fluoroscopic exchange of the bilateral 10 [place] internal external biliary drains MEDICATIONS: 400 mg Cipro; The antibiotic was administered within an appropriate time frame prior to the initiation of the procedure. ANESTHESIA/SEDATION: Moderate (conscious) sedation was employed during this procedure. A total of Versed 2.0 mg and Fentanyl 100 mcg was administered intravenously. Moderate Sedation Time:  10 minutes. The patient's level of consciousness and vital signs were monitored continuously by radiology nursing throughout the procedure under my direct  supervision. FLUOROSCOPY TIME:  Fluoroscopy Time: 2 minutes 48 seconds (102 mGy). COMPLICATIONS: None immediate. PROCEDURE: Informed written consent was obtained from the patient after a thorough discussion of the procedural risks, benefits and alternatives. All questions were addressed. Maximal Sterile Barrier Technique was utilized including caps, mask, sterile gowns, sterile gloves, sterile drape, hand hygiene and skin antiseptic. A timeout was performed prior to the initiation of the procedure. Under sterile conditions, the existing bilateral 10 [place] internal external biliary drains were removed over Bentson guidewires. New 10 French drains were advanced. Retention loops formed in the duodenum. Contrast injection confirms position and adequate biliary drainage. No immediate complication. Catheters secured with a StatLock devices. External caps applied. Patient was given gravity drainage packs for home use if needed. No immediate complication. Patient tolerated the procedure well. IMPRESSION: Successful fluoroscopic exchange of the bilateral 10 [place] internal external biliary drains. Electronically Signed   By: Jerilynn Mages.  Shick M.D.   On: 02/03/2018 16:40   Ir Paracentesis  Result Date: 02/25/2018 INDICATION: Patient with history of metastatic cholangiocarcinoma, ETOH cirrhosis, currently admitted for new onset seizures and upper GI bleeding. Imaging shows new onset ascites in setting of leukocytosis - request for diagnostic paracentesis to r/o SBP. Ordering provider has requested a 2 L maximum. EXAM: ULTRASOUND GUIDED DIAGNOSTIC PARACENTESIS MEDICATIONS: 10 mL 1% lidocaine. COMPLICATIONS: None immediate. PROCEDURE: Informed written consent was obtained from the patient after a discussion of the risks, benefits and alternatives to treatment. A timeout was performed prior to the initiation of the procedure. Initial ultrasound scanning demonstrates a moderate amount of ascites within the left lower abdominal  quadrant. The left lower abdomen was prepped and draped in the usual sterile fashion. 1% lidocaine was used for local anesthesia. Following this, a 19 gauge, 7-cm, Yueh catheter was introduced. An ultrasound image was saved for documentation purposes. The paracentesis was performed. The catheter was removed and a dressing was applied. The patient tolerated the procedure well without immediate post procedural complication. FINDINGS: A total of approximately 1.0 L of hazy yellow fluid was removed. Samples were sent to the laboratory as requested by the clinical team. IMPRESSION: Successful ultrasound-guided paracentesis yielding 1.0 liters of peritoneal fluid. Read by Candiss Norse, PA-C Electronically Signed   By: Aletta Edouard M.D.   On: 02/25/2018 13:56    Micro Results    Recent Results (from the past 240 hour(s))  Blood culture (routine x 2)     Status: None (Preliminary result)   Collection Time: 02/24/18  2:47 AM  Result Value Ref Range Status   Specimen Description BLOOD RIGHT FOREARM  Final   Special Requests   Final    BOTTLES DRAWN AEROBIC ONLY Blood Culture results may not be optimal due to an inadequate volume of blood received in culture bottles   Culture NO GROWTH 3 DAYS  Final   Report Status PENDING  Incomplete  Blood culture (routine x 2)     Status: None (Preliminary result)   Collection Time: 02/24/18  2:48 AM  Result Value Ref Range Status   Specimen Description BLOOD RIGHT HAND  Final   Special Requests   Final    BOTTLES DRAWN AEROBIC AND ANAEROBIC Blood Culture results may not be optimal due to an inadequate volume of blood received in culture bottles   Culture NO GROWTH 3 DAYS  Final   Report Status PENDING  Incomplete  MRSA PCR Screening     Status: None   Collection Time: 02/24/18  5:00 PM  Result Value Ref Range Status   MRSA by PCR NEGATIVE NEGATIVE Final    Comment:        The GeneXpert MRSA Assay (FDA approved for NASAL specimens only), is one  component of a comprehensive MRSA colonization surveillance program. It is not intended to diagnose MRSA infection nor to guide or monitor treatment for MRSA infections. Performed at [facility], [place] [address], [place], [place] [address]   Gram stain     Status: None   Collection Time: 02/25/18  1:43 PM  Result Value Ref Range Status   Specimen Description PERITONEAL  Final   Special Requests NONE  Final   Gram Stain   Final    MODERATE WBC PRESENT,BOTH PMN AND MONONUCLEAR NO ORGANISMS SEEN Performed at [facility], [address], [place], [place] [address]    Report Status 02/25/2018 FINAL  Final  Culture, body fluid-bottle     Status: None (Preliminary result)   Collection Time: 02/25/18  1:43 PM  Result Value Ref Range Status   Specimen Description PERITONEAL  Final   Special Requests   Final    NONE Performed at [facility], [place] [address], [place], [place] [address]    Culture NO GROWTH 2 DAYS  Final   Report Status PENDING  Incomplete    Today   Subjective    Tevon Berhane today has no headache,no chest abdominal pain,no new weakness tingling or numbness, feels much better wants to go home today.    Objective   Blood pressure 92/69, pulse 97, temperature 98.7 F (37.1 C), temperature source Oral, resp. rate 19, height 6' (1.829 m), weight 88.9 kg, SpO2 100 %.   Intake/Output Summary (Last 24 hours) at 02/27/2018 1045 Last data filed at 02/27/2018 0800 Gross per 24 hour  Intake -  Output 850 ml  Net -850 ml    Exam Awake Alert, Oriented x 3, No new F.N deficits, Normal affect Whitney.AT,PERRAL Supple Neck,No JVD, No cervical lymphadenopathy appriciated.  Symmetrical Chest wall movement, Good air movement bilaterally, CTAB RRR,No Gallops,Rubs or new Murmurs, No Parasternal Heave +ve B.Sounds, Abd Soft, Non tender, No organomegaly appriciated, No rebound -guarding or rigidity. No Cyanosis, Clubbing or edema, No new Rash or bruise    Data Review   CBC w Diff:  Lab Results  Component Value Date   WBC 15.3 (H) 02/27/2018   HGB 8.7 (L) 02/27/2018   HGB 8.3 (L) 02/12/2018   HGB 15.2 07/18/2006   HCT 26.3 (L) 02/27/2018   HCT 42.8 07/18/2006   PLT 136 (L) 02/27/2018   PLT 233 02/12/2018   PLT 193 07/18/2006   LYMPHOPCT 7 02/27/2018   LYMPHOPCT 33.8 07/18/2006   BANDSPCT 0 02/24/2018   MONOPCT 11 02/27/2018   MONOPCT 7.8 07/18/2006   EOSPCT 1 02/27/2018   EOSPCT 3.0 07/18/2006   BASOPCT 0 02/27/2018   BASOPCT 0.6 07/18/2006    CMP:  Lab Results  Component Value Date   NA 134 (L) 02/27/2018   K 3.6 02/27/2018   CL 105 02/27/2018   CO2 24 02/27/2018   BUN 13 02/27/2018   CREATININE 0.71 02/27/2018   CREATININE 0.67 02/12/2018   PROT 5.2 (L) 02/27/2018   ALBUMIN 2.0 (L) 02/27/2018   BILITOT 1.5 (H) 02/27/2018   BILITOT 0.9 02/12/2018   ALKPHOS 229 (H) 02/27/2018   AST 123 (  H) 02/27/2018   AST 41 02/12/2018   ALT 452 (H) 02/27/2018   ALT 46 (H) 02/12/2018  .   Total Time in preparing paper work, data evaluation and todays exam - 49 minutes  Lala Lund M.D on 02/27/2018 at 10:45 AM  [facility]   Office  [phone]

## 2018-02-28 LAB — BPAM RBC
Blood Product Expiration Date: 202003092359
Blood Product Expiration Date: 202003092359
Blood Product Expiration Date: 202003092359
Blood Product Expiration Date: 202003112359
Blood Product Expiration Date: 202003122359
Blood Product Expiration Date: 202003122359
Blood Product Expiration Date: 202003122359
ISSUE DATE / TIME: 202002180328
ISSUE DATE / TIME: 202002180328
ISSUE DATE / TIME: 202002180907
ISSUE DATE / TIME: 202002190955
ISSUE DATE / TIME: 202002191417
Unit Type and Rh: 6200
Unit Type and Rh: 6200
Unit Type and Rh: 6200
Unit Type and Rh: 6200
Unit Type and Rh: 6200
Unit Type and Rh: 6200
Unit Type and Rh: 6200

## 2018-02-28 LAB — TYPE AND SCREEN
ABO/RH(D): A POS
Antibody Screen: NEGATIVE
UNIT DIVISION: 0
Unit division: 0
Unit division: 0
Unit division: 0
Unit division: 0
Unit division: 0
Unit division: 0

## 2018-03-01 LAB — CULTURE, BLOOD (ROUTINE X 2)
CULTURE: NO GROWTH
Culture: NO GROWTH

## 2018-03-02 DIAGNOSIS — Y92009 Unspecified place in unspecified non-institutional (private) residence as the place of occurrence of the external cause: Secondary | ICD-10-CM | POA: Diagnosis not present

## 2018-03-02 DIAGNOSIS — M79672 Pain in left foot: Secondary | ICD-10-CM | POA: Diagnosis not present

## 2018-03-02 DIAGNOSIS — W19XXXA Unspecified fall, initial encounter: Secondary | ICD-10-CM | POA: Diagnosis not present

## 2018-03-02 LAB — CULTURE, BODY FLUID W GRAM STAIN -BOTTLE: Culture: NO GROWTH

## 2018-03-02 LAB — CULTURE, BODY FLUID-BOTTLE

## 2018-03-03 NOTE — Progress Notes (Signed)
[place]   [phone] [phone]   Clinic Follow up Note   Patient Care Team: [name], MD as PCP - General (Internal Medicine) 03/05/2018  CHIEF COMPLAINT: F/u on metastatic cholangiocarcinoma   SUMMARY OF ONCOLOGIC HISTORY: Oncology History   Cancer Staging Intrahepatic cholangiocarcinoma ([place]) Staging form: Intrahepatic Bile Duct, AJCC 8th Edition - Clinical stage from 10/29/2017: Stage IV (cT2, cN1, pM1) - Signed by [name], MD on 11/06/2017       Malignant neoplasm metastatic to adrenal gland [facility])    Initial Diagnosis    Malignant neoplasm metastatic to adrenal gland ([place])    10/23/2017 Imaging    ABD US IMPRESSION: Cirrhotic appearance of the liver.  2.6 cm questionable ill-defined mass versus area of focal parenchymal heterogeneity in segment 6 of the liver. If further imaging evaluation is desired, liver protocol abdominal MRI should be considered.  Splenomegaly.  Apparent thickening of the gallbladder wall, without evidence of cholelithiasis, and with negative sonographic Murphy's sign. The findings are favored to represent gallbladder wall edema secondary to systemic causes such as hyperproteinemia rather than acute cholecystitis. Please correlate clinically.     10/25/2017 Imaging    MRI IMPRESSION: 1. Advanced changes of cirrhosis. Interval development of multifocal enhancing liver lesions, which is concerning for multicentric hepatocellular carcinoma involving both lobes of liver. 2. Lesion within segment 4 B obstructs the right medial hepatic duct and left main duct resulting in moderate to marked biliary ductal dilatation within the left lobe and dome of liver. There may be partial obstruction of the right lateral duct with mild dilatation. 3. Tumor thrombus within the left branch of the portal vein likely secondary to segment 4 B tumor. 4. Bilateral adrenal metastasis and retroperitoneal  nodal metastasis. 5. Stigmata of portal venous hypertension including varices, ascites and splenomegaly. 6. These results will be called to the ordering clinician or representative by the Radiologist Assistant, and communication documented in the PACS or zVision Dashboard.    10/26/2017 Procedure    IMPRESSION: Status post formal PTC and internal/external biliary drainage.    10/27/2017 Imaging    IMPRESSION: Changes of cirrhosis. Multiple hepatic lesions are noted concerning for multifocal hepatoma or metastases. Biliary drain is in place with the tip in the distal common bile duct. There is mild intrahepatic biliary ductal dilatation. Bilateral adrenal nodules and masses, right larger than left compatible with metastases. Retroperitoneal adenopathy. Large stool burden with mild gaseous distention of the colon. Aortic atherosclerosis.     10/29/2017 Pathology Results    Diagnosis Soft Tissue Needle Core Biopsy, right adrenal mass - METASTATIC ADENOCARCINOMA, SEE COMMENT. Microscopic Comment There are malignant glands with central necrosis. Immunohistochemistry is positive for cytokeratin 20 and CDX-2. Cytokeratin 7, TTF-1 and NapsinA are negative. The morphology and immunoprofile favor a colorectal primary over pancreaticobiliary. [name] has reviewed the case. [name] was notified on 10/31/2017.    11/04/2017 Procedure    Colonoscopy impression - Seton in place found on digital rectal exam. - Congested mucosa in the rectum, in the sigmoid colon and in the descending colon. - A few diminutive polyps in the rectum and in the sigmoid colon. No colon or rectal mass identified. - No specimens collected.    02/19/2018 Imaging    CT CAP IMPRESSION: 1. Slight interval increase in size of left adrenal gland metastasis. Similar-appearing right adrenal gland metastasis. 2. While difficult to measure given ill-defined nature, infiltrative mass within the left hepatic lobe is  grossly similar when  compared to prior exam. Additional lesions within the liver are grossly similar. 3. Interval insertion of biliary drain within the left hepatic lobe with resultant decompression of the left hepatic lobe biliary system. 4. Similar-appearing sharply marginated consolidation within the right lower lobe which may represent infectious/inflammatory process or metastatic disease. 5. Large amount of stool throughout the colon as can be seen with constipation. 6. Persistent left hepatic lobe portal venous thrombosis. 7. Splenomegaly. 8. New moderate volume ascites.     Intrahepatic cholangiocarcinoma ([place])   10/27/2017 Imaging    10/27/2017 CT AP IMPRESSION: Changes of cirrhosis. Multiple hepatic lesions are noted concerning for multifocal hepatoma or metastases.  Biliary drain is in place with the tip in the distal common bile duct. There is mild intrahepatic biliary ductal dilatation.  Bilateral adrenal nodules and masses, right larger than left compatible with metastases.  Retroperitoneal adenopathy.  Large stool burden with mild gaseous distention of the colon.  Aortic atherosclerosis.    10/29/2017 Cancer Staging    Staging form: Intrahepatic Bile Duct, AJCC 8th Edition - Clinical stage from 10/29/2017: Stage IV (cT2, cN1, pM1) - Signed by [name], MD on 11/06/2017    10/29/2017 Pathology Results    10/29/2017 Surgical Pathology Diagnosis Soft Tissue Needle Core Biopsy, right adrenal mass - METASTATIC ADENOCARCINOMA, SEE COMMENT.    11/06/2017 Initial Diagnosis    Intrahepatic cholangiocarcinoma ([place])    11/06/2017 Imaging    11/06/2017 CT AP IMPRESSION: 1. Increase in RIGHT pleural effusion. Stable masslike consolidation at the RIGHT lung base representing neoplasm versus pneumonia. 2. Stable metastatic nodule at the RIGHT lung base. 3. Infiltrative mass in the central LEFT hepatic lobe and discrete lesions in the RIGHT hepatic lobe  consistent with metastatic disease versus primary hepatic biliary carcinoma. Favor cholangiocarcinoma. 4. Percutaneous drainage catheter extends the porta hepatis. Small trapped pneumothorax along the tract of the catheter at the inferolateral RIGHT lung base. 5. Stable adrenal metastasis. 6. Metastatic adenopathy in the retroperitoneum unchanged. 7. Morphologic changes in liver consistent with  cirrhosis.    11/08/2017 Imaging    11/08/2017 CT AP IMPRESSION: 1. Overall no significant interval change since the CT of 11/06/2017. Stable positioning of the percutaneous biliary drain with similar degree of biliary ductal dilatation. 2. Cirrhosis with multiple hepatic metastatic disease. Infiltrative appearing lesion in the left lobe of the liver appears to extend into the left portal vein and may represent a tumor thrombus, or bland thrombus. A primary biliary neoplasm (cholangiocarcinoma, or metastatic disease are other considerations. 3. Retroperitoneal, mesenteric, and adrenal metastatic disease. 4. Hepatic colopathy versus less likely segmental colitis of the hepatic flexure. 5. Small right pleural effusion and bilateral lung base pulmonary masses/nodules as seen previously. 6. Tiny pockets of pneumoperitoneum lateral to the liver along the percutaneous drain, decreased since the prior CT. 7. Other findings as above.    11/20/2017 - 11/24/2017 Hospital Admission    Admit date: 11/08/2017 Admission diagnosis: fatigue Additional comments: discharged 11/24/2017    12/18/2017 -  Chemotherapy    first line chemo gemcitabine/cisplatin on days 1 and 15 every 28 days starting 12/18/17    12/22/2017 - 12/30/2017 Hospital Admission    Admit date: 12/22/2017 Admission diagnosis: Dysuria    12/22/2017 Imaging    12/22/2017 CT AP IMPRESSION: 1. Prominently distended and otherwise normal urinary bladder with mild prostatomegaly. Findings suggest acute bladder outlet obstruction. No  hydronephrosis. 2. Large colorectal stool volume compatible with constipation. New mild circumferential rectal wall thickening and perirectal fat haziness suggesting stercoral  colitis. No free air. No abscess. 3. Stable infiltrative cholangiocarcinoma in the central left liver lobe with associated peripheral intrahepatic biliary ductal dilatation throughout the left liver lobe. 4. Stable well-positioned internal external drain traversing the right liver lobe and decompressing the right liver biliary system. 5. Stable liver, bilateral adrenal, retroperitoneal, left mesenteric and right infrahilar nodal and left lung base metastases. No progressive metastatic disease since 12/03/2017 CT. 6. Cirrhosis. Trace perihepatic ascites, decreased. Stable moderate splenomegaly. 7.  Aortic Atherosclerosis (ICD10-I70.0).    03/11/2018 -  Chemotherapy    The patient had palonosetron (ALOXI) injection 0.25 mg, 0.25 mg, Intravenous,  Once, 0 of 4 cycles leucovorin 840 mg in dextrose 5 % 250 mL infusion, 400 mg/m2 = 840 mg, Intravenous,  Once, 0 of 4 cycles oxaliplatin (ELOXATIN) 180 mg in dextrose 5 % 500 mL chemo infusion, 85 mg/m2 = 180 mg, Intravenous,  Once, 0 of 3 cycles fluorouracil (ADRUCIL) 4,200 mg in sodium chloride 0.9 % 66 mL chemo infusion, 2,000 mg/m2 = 4,200 mg (100 % of original dose 2,000 mg/m2), Intravenous, 1 Day/Dose, 0 of 4 cycles Dose modification: 2,000 mg/m2 (original dose 2,000 mg/m2, Cycle 1, Reason: Provider Judgment)  for chemotherapy treatment.      CURRENT THERAPY  first line chemo gemcitabine/cisplatin on days 1 and8every 21days started12/12/19 stopped 02/20/18 PENDING FOLFOX every 2 weeks starting, next week   INTERVAL HISTORY: Matthew Lewis is a 63 y.o. male who is here for follow-up. Since last visit, on 02/24/2018, he presented to the ED due to upper GI bleeding, syncope episodes, onset blindness, disorienation and possible seizure activity. He had an EGD on  02/25/2018.  Today, he is here alone. A couple days after he left the hospital he still experienced melena but now his stool is brown. He is feeling constipated but takes a laxitive. He has fatigue but he manages to move around for more than half the day. His blood sugar is improving and he can eat small meals and drinks Ensure. Denies any pain, fever or cp.   He has some swelling in his left foot due to the fall he had before he went to the hospital.    Pertinent positives and negatives of review of systems are listed and detailed within the above HPI.  REVIEW OF SYSTEMS:   Constitutional: Denies fevers, chills or abnormal weight loss, (+) fatigue  Eyes: Denies blurriness of vision Ears, nose, mouth, throat, and face: Denies mucositis or sore throat Respiratory: Denies cough, dyspnea or wheezes Cardiovascular: Denies palpitation, chest discomfort or lower extremity swelling Gastrointestinal:  Denies nausea, heartburn, (+) constipation  Skin: Denies abnormal skin rashes Lymphatics: Denies new lymphadenopathy or easy bruising Neurological:Denies numbness, tingling or new weaknesses Behavioral/Psych: Mood is stable, no new changes  Musculoskeletal: (+) mild left foot swelling  All other systems were reviewed with the patient and are negative.  MEDICAL HISTORY:  Past Medical History:  Diagnosis Date  . Abscess of anal and rectal regions    horse shoe abscess  . Alcoholic cirrhosis ([place])   . Arthritis    "everywhere; especially in my spine"  . Cirrhosis ([place])    STAGE 1 CIRRHOSIS PATIENT SEES DR NAMDIGAM FOR  . Colon cancer ([place]) 05/2005   TX SURGERY WITH LYMPH NODE REMOVAL  . Cough    LAST FEW WEEKS SAW DR RAMOS LUNGS MILKY SPUTUM OCC  . DM type 2 with diabetic peripheral neuropathy ([facility])    left foot  . Fatigue   . Fistula, anal  multiple  . History of substance abuse ([place])    last alcohol was 08/2011; last marijuania was 08/2011  . Hypertension   . PTSD (post-traumatic stress  disorder)    "severe" HX OF  . Sleep deprivation     SURGICAL HISTORY: Past Surgical History:  Procedure Laterality Date  . ABSCESS DRAINAGE     "probably 15-20 so far at least; rectal"  . ANAL FISTULECTOMY  12/13/1998  . ANTERIOR FUSION CERVICAL SPINE  2010   "triple"; "did 2 surgeries on the same day in 8502 due to complications"  . CORONARY ANGIOPLASTY  2001  . detached muscle  2010   right chest; "after cervical fusion complications"  . ELBOW SURGERY  2007   "cut out part of a muscle"; left  . ESOPHAGEAL BANDING  02/25/2018   Procedure: ESOPHAGEAL BANDING;  Surgeon: Ladene Artist, MD;  Location: [facility] ENDOSCOPY;  Service: Endoscopy;;  . ESOPHAGOGASTRODUODENOSCOPY (EGD) WITH PROPOFOL N/A 02/25/2018   Procedure: ESOPHAGOGASTRODUODENOSCOPY (EGD) WITH PROPOFOL;  Surgeon: Ladene Artist, MD;  Location[facility] ENDOSCOPY;  Service: Endoscopy;  Laterality: N/A;  . EXAMINATION UNDER ANESTHESIA  03/22/2005   fistula  . EXAMINATION UNDER ANESTHESIA  06/06/2011   Procedure: EXAM UNDER ANESTHESIA;  Surgeon: Stark Klein, MD;  Location: [place];  Service: General;  Laterality: N/A;  . HEMICOLECTOMY  06/06/2005   left  . INCISION AND DRAINAGE PERIRECTAL ABSCESS  03/30/2005  . INCISION AND DRAINAGE PERIRECTAL ABSCESS  12/13/2004  . INCISION AND DRAINAGE PERIRECTAL ABSCESS  07/14/2001  . INCISION AND DRAINAGE PERIRECTAL ABSCESS  08/12/2010   horseshoe abscess; Dr Redmond Pulling  . INCISION AND DRAINAGE PERIRECTAL ABSCESS  06/06/2011   Procedure: IRRIGATION AND DEBRIDEMENT PERIRECTAL ABSCESS;  Surgeon: Stark Klein, MD;  Location: [place];  Service: General;  Laterality: N/A;  . INCISION AND DRAINAGE PERIRECTAL ABSCESS N/A 02/24/2017   Procedure: IRRIGATION AND DEBRIDEMENT PERIRECTAL ABSCESS;  Surgeon: Ralene Ok, MD;  Location: [place];  Service: General;  Laterality: N/A;  . IR CATHETER TUBE CHANGE  02/19/2018  . IR CHOLANGIOGRAM EXISTING TUBE  12/23/2017  . IR CHOLANGIOGRAM EXISTING TUBE  01/02/2018  . IR  EXCHANGE BILIARY DRAIN  11/07/2017  . IR EXCHANGE BILIARY DRAIN  12/09/2017  . IR EXCHANGE BILIARY DRAIN  02/03/2018  . IR EXCHANGE BILIARY DRAIN  02/03/2018  . IR IMAGING GUIDED PORT INSERTION  12/09/2017  . IR INT EXT BILIARY DRAIN WITH CHOLANGIOGRAM  10/26/2017  . IR INT EXT BILIARY DRAIN WITH CHOLANGIOGRAM  12/23/2017  . IR PARACENTESIS  02/25/2018  . IR PATIENT EVAL TECH 0-60 MINS  12/02/2017  . IR RADIOLOGIST EVAL & MGMT  11/06/2017  . IR RADIOLOGIST EVAL & MGMT  01/05/2018  . [place]  . PERCUTANEOUS PINNING PHALANX FRACTURE OF HAND  ~ 2008   "plates in 2 places"; right  . PLACEMENT OF SETON N/A 09/05/2017   Procedure: PLACEMENT OF SETON;  Surgeon: Leighton Ruff, MD;  Location: [facility];  Service: General;  Laterality: N/A;  . RADIOLOGY WITH ANESTHESIA N/A 10/25/2017   Procedure: MRI WITH ANESTHESIA;  Surgeon: Radiologist, Medication, MD;  Location: [place];  Service: Radiology;  Laterality: N/A;  . THORACIC DISCECTOMY  1990's    I have reviewed the social history and family history with the patient and they are unchanged from previous note.  ALLERGIES:  is allergic to morphine and related.  MEDICATIONS:  Current Outpatient Medications  Medication Sig Dispense Refill  . aspirin EC 81 MG tablet Take  1 tablet (81 mg total) by mouth daily.    . Ca Carbonate-Mag Hydroxide (ROLAIDS PO) Take 1-2 tablets by mouth 2 (two) times daily as needed (heartburn).    . fexofenadine (ALLEGRA) 180 MG tablet Take 180 mg by mouth at bedtime.    . gabapentin (NEURONTIN) 400 MG capsule Take 400-800 mg by mouth See admin instructions. Take 400 mg by mouth in the morning and 800 mg in the evening    . insulin detemir (LEVEMIR) 100 UNIT/ML injection Inject 10-62 Units into the skin See admin instructions. Take 62 in the am and 10 in the pm    . lactose free nutrition (BOOST PLUS) LIQD Take 237 mLs by mouth 3 (three) times daily with meals. (Patient taking differently: Take  237 mLs by mouth 3 (three) times daily with meals. Ensure chocolate and vanilla max protein) 30 Can 0  . lidocaine-prilocaine (EMLA) cream Apply to port site 1 hour prior to access (Patient taking differently: Apply 1 application topically once. Apply to port site 1 hour prior to access) 30 g 0  . Multiple Vitamin (MULTIVITAMIN) tablet Take 1 tablet by mouth daily.    . pantoprazole (PROTONIX) 40 MG tablet Take 1 tablet (40 mg total) by mouth 2 (two) times daily. 60 tablet 0  . propranolol ER (INDERAL LA) 60 MG 24 hr capsule Take 1 capsule (60 mg total) by mouth daily for 30 days. 30 capsule 0  . tamsulosin (FLOMAX) 0.4 MG CAPS capsule TAKE 1 CAPSULE(0.4 MG) BY MOUTH DAILY AFTER SUPPER (Patient taking differently: Take 0.4 mg by mouth daily after supper. ) 90 capsule 0   No current facility-administered medications for this visit.     PHYSICAL EXAMINATION: ECOG PERFORMANCE STATUS: 2 - Symptomatic, <50% confined to bed  Vitals:   03/05/18 0927  BP: 110/70  Pulse: 95  Resp: 18  Temp: 97.7 F (36.5 C)  SpO2: 98%   Filed Weights   03/05/18 0927  Weight: 201 lb 11.2 oz (91.5 kg)    GENERAL:alert, no distress and comfortable SKIN: skin color, texture, turgor are normal, no rashes or significant lesions EYES: normal, Conjunctiva are pink and non-injected, sclera clear OROPHARYNX:no exudate, no erythema and lips, buccal mucosa, and tongue normal  NECK: supple, thyroid normal size, non-tender, without nodularity LYMPH:  no palpable lymphadenopathy in the cervical, axillary or inguinal LUNGS: clear to auscultation and percussion with normal breathing effort HEART: regular rate & rhythm and no murmurs and no lower extremity edema ABDOMEN:abdomen soft, non-tender and normal bowel sounds Musculoskeletal:no cyanosis of digits and no clubbing  NEURO: alert & oriented x 3 with fluent speech, no focal motor/sensory deficits  LABORATORY DATA:  I have reviewed the data as listed CBC Latest Ref  Rng & Units 03/05/2018 02/27/2018 02/26/2018  WBC 4.0 - 10.5 K/uL 14.2(H) 15.3(H) 14.2(H)  Hemoglobin 13.0 - 17.0 g/dL 9.8(L) 8.7(L) 8.6(L)  Hematocrit 39.0 - 52.0 % 30.6(L) 26.3(L) 25.9(L)  Platelets 150 - 400 K/uL 205 136(L) 115(L)     CMP Latest Ref Rng & Units 03/05/2018 02/27/2018 02/26/2018  Glucose 70 - 99 mg/dL 201(H) 147(H) 174(H)  BUN 8 - 23 mg/dL 11 13 12   Creatinine 0.61 - 1.24 mg/dL 0.73 0.71 0.71  Sodium 135 - 145 mmol/L 131(L) 134(L) 132(L)  Potassium 3.5 - 5.1 mmol/L 4.2 3.6 3.5  Chloride 98 - 111 mmol/L 99 105 103  CO2 22 - 32 mmol/L 23 24 22   Calcium 8.9 - 10.3 mg/dL 8.4(L) 8.0(L) 7.7(L)  Total Protein 6.5 -  8.1 g/dL 7.1 5.2(L) 4.9(L)  Total Bilirubin 0.3 - 1.2 mg/dL 1.3(H) 1.5(H) 2.1(H)  Alkaline Phos 38 - 126 U/L 493(H) 229(H) 263(H)  AST 15 - 41 U/L 35 123(H) 398(H)  ALT 0 - 44 U/L 74(H) 452(H) 736(H)      RADIOGRAPHIC STUDIES: I have personally reviewed the radiological images as listed and agreed with the findings in the report.  02/19/2018 CT CAP IMPRESSION: 1. Slight interval increase in size of left adrenal gland metastasis. Similar-appearing right adrenal gland metastasis. 2. While difficult to measure given ill-defined nature, infiltrative mass within the left hepatic lobe is grossly similar when compared to prior exam. Additional lesions within the liver are grossly similar. 3. Interval insertion of biliary drain within the left hepatic lobe with resultant decompression of the left hepatic lobe biliary system. 4. Similar-appearing sharply marginated consolidation within the right lower lobe which may represent infectious/inflammatory process or metastatic disease. 5. Large amount of stool throughout the colon as can be seen with constipation. 6. Persistent left hepatic lobe portal venous thrombosis. 7. Splenomegaly. 8. New moderate volume ascites.   ASSESSMENT & PLAN:  Matthew Lewis is a 63 y.o. male with history of  1.  Metastaticintrahepatic cholangiocarcinomato right adrenal gland, liver, lungs and nodes -he presented withobstructive jaundice.Diagnosed in 10/2017. He had 2 biliary drainage placements. His cancer treatment was postponed due to multiple hospitalization for infection, dehydration, it etc. He was onfirst lineCisplatin and gemcitabine stopped on 02/20/2018 because his restaging CT scan showed enlarged  adrenal glad metastasis consistent disease progression.  -I discussed second line treatment options, such as FOLFOX, potential benefit and side effects discussed with patient in details, especially cold sensitivity, neuropathy, cytopenias, etc.  Patient voiced good understanding, and agrees to proceed. -He was recently hospitalized for upper GI bleeding from duodenal ulcers, endoscopy showed no active bleeding, he is on PPI.  He is recovering.  -Plan to start FOLFOX next week, due to his recent GI bleeding, I will give 5-FU and leucovorin only for first cycle. -Lab reviewed, anemia improved, CMP showed slightly elevated bilirubin and liver enzymes, overall stable. -I plan to see him back in 3 weeks before second cycle FOLFOX.  2.Obstructive jaundice, status post bilateral percutaneous biliary drain -drained tube exchange 1/28, both were capped. Due to bilary leakage from left side this morning, the left drain tube is connected to a bag now, it is draining well, and his output has decreased. -f/u with IR  -continue monitoring LFTs   3.Liver cirrhosis secondary to alcohol and NASH -Stable  4. Portal vein thrombosis -He is on aspirin, no anticoagulation due to bloody biliary drainage  5. DM, HTN -f/u with PCP  6.H/o substance abuse and addiction -He's been clean for 6 years now -will becautious about narcoticsandbenzo.  7. Anemia  -Secondary to chemotherapy, recent GI bleeding and underlying malignancy -He received 5 unit blood transfusion during his hospitalization for  GI bleeding, anemia improved.  8. GU bleeding from duodenal ulcers -Patient developed upper GI bleeding on February 24, 2018, required hospitalization, and multiple blood transfusion. -Endoscopy showed renal ulcers without active bleeding, possible related to NSAIDs, she has stopped. -Continue Protonix twice daily indefinitely per GI Dr. Fuller Plan   9.Goal of care discussion, DNR/DNI -Wepreviouslydiscussed code status in length.I recommend DNR/DNI,he agrees. -He understands his cancer is not curable, and the goal of therapy is to prolong his life and improve his quality of life.   Plan  - I will cancel his chemotherapy appointment tomorrow  -  I reviewed labs today, he will start FOLFOX chemotherapy next week, with LV and 5-Fluorouracil only for first cycle  -f/u with second cycle FOLFOX   No problem-specific Assessment & Plan notes found for this encounter.   No orders of the defined types were placed in this encounter.  All questions were answered. The patient knows to call the clinic with any problems, questions or concerns. No barriers to learning was detected. I spent 20 minutes counseling the patient face to face. The total time spent in the appointment was 25 minutes and more than 50% was on counseling and review of test results  I, Manson Allan am acting as scribe for Dr. [name].  I have reviewed the above documentation for accuracy and completeness, and I agree with the above.     [name], MD 03/05/2018

## 2018-03-04 ENCOUNTER — Encounter: Payer: Self-pay | Admitting: Pharmacist

## 2018-03-05 ENCOUNTER — Encounter: Payer: Self-pay | Admitting: Hematology

## 2018-03-05 ENCOUNTER — Inpatient Hospital Stay: Payer: Medicare Other

## 2018-03-05 ENCOUNTER — Inpatient Hospital Stay (HOSPITAL_BASED_OUTPATIENT_CLINIC_OR_DEPARTMENT_OTHER): Payer: Medicare Other | Admitting: Hematology

## 2018-03-05 ENCOUNTER — Telehealth: Payer: Self-pay | Admitting: Hematology

## 2018-03-05 VITALS — BP 110/70 | HR 95 | Temp 97.7°F | Resp 18 | Ht 72.0 in | Wt 201.7 lb

## 2018-03-05 DIAGNOSIS — K7030 Alcoholic cirrhosis of liver without ascites: Secondary | ICD-10-CM

## 2018-03-05 DIAGNOSIS — C221 Intrahepatic bile duct carcinoma: Secondary | ICD-10-CM | POA: Diagnosis not present

## 2018-03-05 DIAGNOSIS — C7971 Secondary malignant neoplasm of right adrenal gland: Secondary | ICD-10-CM

## 2018-03-05 DIAGNOSIS — I10 Essential (primary) hypertension: Secondary | ICD-10-CM

## 2018-03-05 DIAGNOSIS — Z79899 Other long term (current) drug therapy: Secondary | ICD-10-CM | POA: Diagnosis not present

## 2018-03-05 DIAGNOSIS — Z5189 Encounter for other specified aftercare: Secondary | ICD-10-CM | POA: Diagnosis not present

## 2018-03-05 DIAGNOSIS — E119 Type 2 diabetes mellitus without complications: Secondary | ICD-10-CM

## 2018-03-05 DIAGNOSIS — D6481 Anemia due to antineoplastic chemotherapy: Secondary | ICD-10-CM | POA: Diagnosis not present

## 2018-03-05 DIAGNOSIS — E1142 Type 2 diabetes mellitus with diabetic polyneuropathy: Secondary | ICD-10-CM | POA: Diagnosis not present

## 2018-03-05 DIAGNOSIS — Z5111 Encounter for antineoplastic chemotherapy: Secondary | ICD-10-CM | POA: Diagnosis not present

## 2018-03-05 DIAGNOSIS — Z794 Long term (current) use of insulin: Secondary | ICD-10-CM

## 2018-03-05 DIAGNOSIS — C7802 Secondary malignant neoplasm of left lung: Secondary | ICD-10-CM | POA: Diagnosis not present

## 2018-03-05 DIAGNOSIS — C7801 Secondary malignant neoplasm of right lung: Secondary | ICD-10-CM | POA: Diagnosis not present

## 2018-03-05 DIAGNOSIS — Z95828 Presence of other vascular implants and grafts: Secondary | ICD-10-CM

## 2018-03-05 LAB — CMP (CANCER CENTER ONLY)
ALT: 74 U/L — ABNORMAL HIGH (ref 0–44)
AST: 35 U/L (ref 15–41)
Albumin: 2.4 g/dL — ABNORMAL LOW (ref 3.5–5.0)
Alkaline Phosphatase: 493 U/L — ABNORMAL HIGH (ref 38–126)
Anion gap: 9 (ref 5–15)
BUN: 11 mg/dL (ref 8–23)
CO2: 23 mmol/L (ref 22–32)
Calcium: 8.4 mg/dL — ABNORMAL LOW (ref 8.9–10.3)
Chloride: 99 mmol/L (ref 98–111)
Creatinine: 0.73 mg/dL (ref 0.61–1.24)
GFR, Estimated: >60 mL/min (ref >60)
Glucose, Bld: 201 mg/dL — ABNORMAL HIGH (ref 70–99)
Potassium: 4.2 mmol/L (ref 3.5–5.1)
Sodium: 131 mmol/L — ABNORMAL LOW (ref 135–145)
Total Bilirubin: 1.3 mg/dL — ABNORMAL HIGH (ref 0.3–1.2)
Total Protein: 7.1 g/dL (ref 6.5–8.1)

## 2018-03-05 LAB — CBC WITH DIFFERENTIAL (CANCER CENTER ONLY)
Abs Immature Granulocytes: 0.08 10*3/uL — ABNORMAL HIGH (ref 0.00–0.07)
BASOS PCT: 0 %
Basophils Absolute: 0 10*3/uL (ref 0.0–0.1)
Eosinophils Absolute: 0.1 10*3/uL (ref 0.0–0.5)
Eosinophils Relative: 1 %
HCT: 30.6 % — ABNORMAL LOW (ref 39.0–52.0)
Hemoglobin: 9.8 g/dL — ABNORMAL LOW (ref 13.0–17.0)
IMMATURE GRANULOCYTES: 1 %
Lymphocytes Relative: 5 %
Lymphs Abs: 0.8 10*3/uL (ref 0.7–4.0)
MCH: 30.6 pg (ref 26.0–34.0)
MCHC: 32 g/dL (ref 30.0–36.0)
MCV: 95.6 fL (ref 80.0–100.0)
Monocytes Absolute: 1.2 10*3/uL — ABNORMAL HIGH (ref 0.1–1.0)
Monocytes Relative: 8 %
NEUTROS PCT: 85 %
NRBC: 0 % (ref 0.0–0.2)
Neutro Abs: 12 10*3/uL — ABNORMAL HIGH (ref 1.7–7.7)
PLATELETS: 205 10*3/uL (ref 150–400)
RBC: 3.2 MIL/uL — ABNORMAL LOW (ref 4.22–5.81)
RDW: 16.5 % — ABNORMAL HIGH (ref 11.5–15.5)
WBC Count: 14.2 10*3/uL — ABNORMAL HIGH (ref 4.0–10.5)

## 2018-03-05 MED ORDER — HEPARIN SOD (PORK) LOCK FLUSH 100 UNIT/ML IV SOLN
500.0000 [IU] | Freq: Once | INTRAVENOUS | Status: AC | PRN
  Administered 2018-03-05: 500 [IU]
  Filled 2018-03-05: qty 5

## 2018-03-05 MED ORDER — HEPARIN SOD (PORK) LOCK FLUSH 100 UNIT/ML IV SOLN
500.0000 [IU] | Freq: Once | INTRAVENOUS | Status: AC
  Administered 2018-03-05: 500 [IU] via INTRAVENOUS
  Filled 2018-03-05: qty 5

## 2018-03-05 MED ORDER — SODIUM CHLORIDE 0.9% FLUSH
10.0000 mL | INTRAVENOUS | Status: DC | PRN
  Administered 2018-03-05: 10 mL
  Filled 2018-03-05: qty 10

## 2018-03-05 NOTE — Telephone Encounter (Signed)
Scheduled appt per 2/27 los. Added treatment for 3/5 in the book for approval.  No new treatment plan was active at the moment.  Printed calendar and avs.

## 2018-03-06 ENCOUNTER — Ambulatory Visit: Payer: Medicare Other

## 2018-03-06 ENCOUNTER — Telehealth: Payer: Self-pay | Admitting: Hematology

## 2018-03-06 LAB — CANCER ANTIGEN 19-9: CA 19-9: 35 U/mL (ref 0–35)

## 2018-03-06 NOTE — Telephone Encounter (Signed)
Called patient and informed patient that their treatment has been added for 03/04.  Patient aware of appt date and time.

## 2018-03-09 DIAGNOSIS — E1165 Type 2 diabetes mellitus with hyperglycemia: Secondary | ICD-10-CM | POA: Diagnosis not present

## 2018-03-09 DIAGNOSIS — I10 Essential (primary) hypertension: Secondary | ICD-10-CM | POA: Diagnosis not present

## 2018-03-10 DIAGNOSIS — C787 Secondary malignant neoplasm of liver and intrahepatic bile duct: Secondary | ICD-10-CM | POA: Diagnosis not present

## 2018-03-10 DIAGNOSIS — R180 Malignant ascites: Secondary | ICD-10-CM | POA: Diagnosis not present

## 2018-03-10 DIAGNOSIS — I10 Essential (primary) hypertension: Secondary | ICD-10-CM | POA: Diagnosis not present

## 2018-03-10 DIAGNOSIS — C221 Intrahepatic bile duct carcinoma: Secondary | ICD-10-CM | POA: Diagnosis not present

## 2018-03-10 DIAGNOSIS — E1165 Type 2 diabetes mellitus with hyperglycemia: Secondary | ICD-10-CM | POA: Diagnosis not present

## 2018-03-11 ENCOUNTER — Inpatient Hospital Stay: Payer: Medicare Other

## 2018-03-11 ENCOUNTER — Other Ambulatory Visit: Payer: Medicare Other

## 2018-03-11 ENCOUNTER — Telehealth: Payer: Self-pay

## 2018-03-11 ENCOUNTER — Inpatient Hospital Stay: Payer: Medicare Other | Attending: Hematology

## 2018-03-11 VITALS — BP 92/51 | HR 94 | Temp 98.2°F | Resp 19

## 2018-03-11 DIAGNOSIS — R634 Abnormal weight loss: Secondary | ICD-10-CM | POA: Insufficient documentation

## 2018-03-11 DIAGNOSIS — D6481 Anemia due to antineoplastic chemotherapy: Secondary | ICD-10-CM | POA: Insufficient documentation

## 2018-03-11 DIAGNOSIS — I10 Essential (primary) hypertension: Secondary | ICD-10-CM | POA: Insufficient documentation

## 2018-03-11 DIAGNOSIS — Z79899 Other long term (current) drug therapy: Secondary | ICD-10-CM | POA: Diagnosis not present

## 2018-03-11 DIAGNOSIS — Z794 Long term (current) use of insulin: Secondary | ICD-10-CM | POA: Insufficient documentation

## 2018-03-11 DIAGNOSIS — C7972 Secondary malignant neoplasm of left adrenal gland: Secondary | ICD-10-CM | POA: Insufficient documentation

## 2018-03-11 DIAGNOSIS — C7971 Secondary malignant neoplasm of right adrenal gland: Secondary | ICD-10-CM | POA: Insufficient documentation

## 2018-03-11 DIAGNOSIS — Z5111 Encounter for antineoplastic chemotherapy: Secondary | ICD-10-CM | POA: Diagnosis not present

## 2018-03-11 DIAGNOSIS — K7030 Alcoholic cirrhosis of liver without ascites: Secondary | ICD-10-CM | POA: Diagnosis not present

## 2018-03-11 DIAGNOSIS — C221 Intrahepatic bile duct carcinoma: Secondary | ICD-10-CM | POA: Insufficient documentation

## 2018-03-11 DIAGNOSIS — Z95828 Presence of other vascular implants and grafts: Secondary | ICD-10-CM

## 2018-03-11 DIAGNOSIS — E119 Type 2 diabetes mellitus without complications: Secondary | ICD-10-CM | POA: Diagnosis not present

## 2018-03-11 DIAGNOSIS — Z7189 Other specified counseling: Secondary | ICD-10-CM

## 2018-03-11 LAB — CBC WITH DIFFERENTIAL (CANCER CENTER ONLY)
Abs Immature Granulocytes: 0.07 10*3/uL (ref 0.00–0.07)
Basophils Absolute: 0 10*3/uL (ref 0.0–0.1)
Basophils Relative: 0 %
Eosinophils Absolute: 0.2 10*3/uL (ref 0.0–0.5)
Eosinophils Relative: 1 %
HEMATOCRIT: 29.2 % — AB (ref 39.0–52.0)
Hemoglobin: 9.5 g/dL — ABNORMAL LOW (ref 13.0–17.0)
Immature Granulocytes: 1 %
Lymphocytes Relative: 6 %
Lymphs Abs: 0.8 10*3/uL (ref 0.7–4.0)
MCH: 30.9 pg (ref 26.0–34.0)
MCHC: 32.5 g/dL (ref 30.0–36.0)
MCV: 95.1 fL (ref 80.0–100.0)
MONO ABS: 1.2 10*3/uL — AB (ref 0.1–1.0)
Monocytes Relative: 9 %
Neutro Abs: 11.4 10*3/uL — ABNORMAL HIGH (ref 1.7–7.7)
Neutrophils Relative %: 83 %
Platelet Count: 160 10*3/uL (ref 150–400)
RBC: 3.07 MIL/uL — ABNORMAL LOW (ref 4.22–5.81)
RDW: 16.2 % — ABNORMAL HIGH (ref 11.5–15.5)
WBC Count: 13.6 10*3/uL — ABNORMAL HIGH (ref 4.0–10.5)
nRBC: 0 % (ref 0.0–0.2)

## 2018-03-11 LAB — COMPREHENSIVE METABOLIC PANEL
ALT: 57 U/L — ABNORMAL HIGH (ref 0–44)
AST: 55 U/L — AB (ref 15–41)
Albumin: 2.7 g/dL — ABNORMAL LOW (ref 3.5–5.0)
Alkaline Phosphatase: 640 U/L — ABNORMAL HIGH (ref 38–126)
Anion gap: 6 (ref 5–15)
BUN: 13 mg/dL (ref 8–23)
CO2: 23 mmol/L (ref 22–32)
Calcium: 8.6 mg/dL — ABNORMAL LOW (ref 8.9–10.3)
Chloride: 99 mmol/L (ref 98–111)
Creatinine, Ser: 0.75 mg/dL (ref 0.61–1.24)
GFR calc Af Amer: >60 mL/min (ref >60)
Glucose, Bld: 173 mg/dL — ABNORMAL HIGH (ref 70–99)
Potassium: 4 mmol/L (ref 3.5–5.1)
Sodium: 128 mmol/L — ABNORMAL LOW (ref 135–145)
Total Bilirubin: 1.7 mg/dL — ABNORMAL HIGH (ref 0.3–1.2)
Total Protein: 7.8 g/dL (ref 6.5–8.1)

## 2018-03-11 MED ORDER — SODIUM CHLORIDE 0.9% FLUSH
10.0000 mL | INTRAVENOUS | Status: DC | PRN
  Administered 2018-03-11: 10 mL
  Filled 2018-03-11: qty 10

## 2018-03-11 MED ORDER — HEPARIN SOD (PORK) LOCK FLUSH 100 UNIT/ML IV SOLN
500.0000 [IU] | Freq: Once | INTRAVENOUS | Status: DC | PRN
  Filled 2018-03-11: qty 5

## 2018-03-11 MED ORDER — SODIUM CHLORIDE 0.9 % IV SOLN
2000.0000 mg/m2 | INTRAVENOUS | Status: DC
  Administered 2018-03-11: 4200 mg via INTRAVENOUS
  Filled 2018-03-11: qty 84

## 2018-03-11 MED ORDER — SODIUM CHLORIDE 0.9% FLUSH
10.0000 mL | INTRAVENOUS | Status: DC | PRN
  Filled 2018-03-11: qty 10

## 2018-03-11 MED ORDER — SODIUM CHLORIDE 0.9 % IV SOLN
400.0000 mg/m2 | Freq: Once | INTRAVENOUS | Status: AC
  Administered 2018-03-11: 840 mg via INTRAVENOUS
  Filled 2018-03-11: qty 42

## 2018-03-11 MED ORDER — PROCHLORPERAZINE MALEATE 10 MG PO TABS
ORAL_TABLET | ORAL | Status: AC
  Filled 2018-03-11: qty 1

## 2018-03-11 MED ORDER — DEXTROSE 5 % IV SOLN
Freq: Once | INTRAVENOUS | Status: AC
  Administered 2018-03-11: 14:00:00 via INTRAVENOUS
  Filled 2018-03-11: qty 250

## 2018-03-11 MED ORDER — PROCHLORPERAZINE MALEATE 10 MG PO TABS
10.0000 mg | ORAL_TABLET | Freq: Once | ORAL | Status: AC
  Administered 2018-03-11: 10 mg via ORAL

## 2018-03-11 NOTE — Telephone Encounter (Signed)
Called pt to let him know to come in at 1400 on 03/13/2018 for his pump d/c. Pt verbalized understanding. Scheduling message sent.

## 2018-03-11 NOTE — Progress Notes (Signed)
Ok to treat with Bilirubin 1.7 today per MD Burr Medico

## 2018-03-11 NOTE — Patient Instructions (Signed)
[facility] Discharge Instructions for Patients Receiving Chemotherapy  Today you received the following chemotherapy agents Leucovorin and Adrucil   To help prevent nausea and vomiting after your treatment, we encourage you to take your nausea medication as directed.    If you develop nausea and vomiting that is not controlled by your nausea medication, call the clinic.   BELOW ARE SYMPTOMS THAT SHOULD BE REPORTED IMMEDIATELY:  *FEVER GREATER THAN 100.5 F  *CHILLS WITH OR WITHOUT FEVER  NAUSEA AND VOMITING THAT IS NOT CONTROLLED WITH YOUR NAUSEA MEDICATION  *UNUSUAL SHORTNESS OF BREATH  *UNUSUAL BRUISING OR BLEEDING  TENDERNESS IN MOUTH AND THROAT WITH OR WITHOUT PRESENCE OF ULCERS  *URINARY PROBLEMS  *BOWEL PROBLEMS  UNUSUAL RASH Items with * indicate a potential emergency and should be followed up as soon as possible.  Feel free to call the clinic should you have any questions or concerns. The clinic phone number is [phone].  Please show the [facility] at check-in to the Emergency Department and triage nurse.  Leucovorin injection What is this medicine? LEUCOVORIN (loo koe VOR in) is used to prevent or treat the harmful effects of some medicines. This medicine is used to treat anemia caused by a low amount of folic acid in the body. It is also used with 5-fluorouracil (5-FU) to treat colon cancer. This medicine may be used for other purposes; ask your health care provider or pharmacist if you have questions. What should I tell my health care provider before I take this medicine? They need to know if you have any of these conditions: -anemia from low levels of vitamin B-12 in the blood -an unusual or allergic reaction to leucovorin, folic acid, other medicines, foods, dyes, or preservatives -pregnant or trying to get pregnant -breast-feeding How should I use this medicine? This medicine is for injection into a muscle or into a vein. It is  given by a health care professional in a hospital or clinic setting. Talk to your pediatrician regarding the use of this medicine in children. Special care may be needed. Overdosage: If you think you have taken too much of this medicine contact a poison control center or emergency room at once. NOTE: This medicine is only for you. Do not share this medicine with others. What if I miss a dose? This does not apply. What may interact with this medicine? -capecitabine -fluorouracil -phenobarbital -phenytoin -primidone -trimethoprim-sulfamethoxazole This list may not describe all possible interactions. Give your health care provider a list of all the medicines, herbs, non-prescription drugs, or dietary supplements you use. Also tell them if you smoke, drink alcohol, or use illegal drugs. Some items may interact with your medicine. What should I watch for while using this medicine? Your condition will be monitored carefully while you are receiving this medicine. This medicine may increase the side effects of 5-fluorouracil, 5-FU. Tell your doctor or health care professional if you have diarrhea or mouth sores that do not get better or that get worse. What side effects may I notice from receiving this medicine? Side effects that you should report to your doctor or health care professional as soon as possible: -allergic reactions like skin rash, itching or hives, swelling of the face, lips, or tongue -breathing problems -fever, infection -mouth sores -unusual bleeding or bruising -unusually weak or tired Side effects that usually do not require medical attention (report to your doctor or health care professional if they continue or are bothersome): -constipation or diarrhea -loss of  appetite -nausea, vomiting This list may not describe all possible side effects. Call your doctor for medical advice about side effects. You may report side effects to FDA at 1-800-FDA-1088. Where should I keep my  medicine? This drug is given in a hospital or clinic and will not be stored at home. NOTE: This sheet is a summary. It may not cover all possible information. If you have questions about this medicine, talk to your doctor, pharmacist, or health care provider.  2019 Elsevier/Gold Standard (2007-06-30 16:50:29)  Fluorouracil, 5-FU injection What is this medicine? FLUOROURACIL, 5-FU (flure oh YOOR a sil) is a chemotherapy drug. It slows the growth of cancer cells. This medicine is used to treat many types of cancer like breast cancer, colon or rectal cancer, pancreatic cancer, and stomach cancer. This medicine may be used for other purposes; ask your health care provider or pharmacist if you have questions. COMMON BRAND NAME(S): Adrucil What should I tell my health care provider before I take this medicine? They need to know if you have any of these conditions: -blood disorders -dihydropyrimidine dehydrogenase (DPD) deficiency -infection (especially a virus infection such as chickenpox, cold sores, or herpes) -kidney disease -liver disease -malnourished, poor nutrition -recent or ongoing radiation therapy -an unusual or allergic reaction to fluorouracil, other chemotherapy, other medicines, foods, dyes, or preservatives -pregnant or trying to get pregnant -breast-feeding How should I use this medicine? This drug is given as an infusion or injection into a vein. It is administered in a hospital or clinic by a specially trained health care professional. Talk to your pediatrician regarding the use of this medicine in children. Special care may be needed. Overdosage: If you think you have taken too much of this medicine contact a poison control center or emergency room at once. NOTE: This medicine is only for you. Do not share this medicine with others. What if I miss a dose? It is important not to miss your dose. Call your doctor or health care professional if you are unable to keep an  appointment. What may interact with this medicine? -allopurinol -cimetidine -dapsone -digoxin -hydroxyurea -leucovorin -levamisole -medicines for seizures like ethotoin, fosphenytoin, phenytoin -medicines to increase blood counts like filgrastim, pegfilgrastim, sargramostim -medicines that treat or prevent blood clots like warfarin, enoxaparin, and dalteparin -methotrexate -metronidazole -pyrimethamine -some other chemotherapy drugs like busulfan, cisplatin, estramustine, vinblastine -trimethoprim -trimetrexate -vaccines Talk to your doctor or health care professional before taking any of these medicines: -acetaminophen -aspirin -ibuprofen -ketoprofen -naproxen This list may not describe all possible interactions. Give your health care provider a list of all the medicines, herbs, non-prescription drugs, or dietary supplements you use. Also tell them if you smoke, drink alcohol, or use illegal drugs. Some items may interact with your medicine. What should I watch for while using this medicine? Visit your doctor for checks on your progress. This drug may make you feel generally unwell. This is not uncommon, as chemotherapy can affect healthy cells as well as cancer cells. Report any side effects. Continue your course of treatment even though you feel ill unless your doctor tells you to stop. In some cases, you may be given additional medicines to help with side effects. Follow all directions for their use. Call your doctor or health care professional for advice if you get a fever, chills or sore throat, or other symptoms of a cold or flu. Do not treat yourself. This drug decreases your body's ability to fight infections. Try to avoid being around people who are  sick. This medicine may increase your risk to bruise or bleed. Call your doctor or health care professional if you notice any unusual bleeding. Be careful brushing and flossing your teeth or using a toothpick because you may get  an infection or bleed more easily. If you have any dental work done, tell your dentist you are receiving this medicine. Avoid taking products that contain aspirin, acetaminophen, ibuprofen, naproxen, or ketoprofen unless instructed by your doctor. These medicines may hide a fever. Do not become pregnant while taking this medicine. Women should inform their doctor if they wish to become pregnant or think they might be pregnant. There is a potential for serious side effects to an unborn child. Talk to your health care professional or pharmacist for more information. Do not breast-feed an infant while taking this medicine. Men should inform their doctor if they wish to father a child. This medicine may lower sperm counts. Do not treat diarrhea with over the counter products. Contact your doctor if you have diarrhea that lasts more than 2 days or if it is severe and watery. This medicine can make you more sensitive to the sun. Keep out of the sun. If you cannot avoid being in the sun, wear protective clothing and use sunscreen. Do not use sun lamps or tanning beds/booths. What side effects may I notice from receiving this medicine? Side effects that you should report to your doctor or health care professional as soon as possible: -allergic reactions like skin rash, itching or hives, swelling of the face, lips, or tongue -low blood counts - this medicine may decrease the number of white blood cells, red blood cells and platelets. You may be at increased risk for infections and bleeding. -signs of infection - fever or chills, cough, sore throat, pain or difficulty passing urine -signs of decreased platelets or bleeding - bruising, pinpoint red spots on the skin, black, tarry stools, blood in the urine -signs of decreased red blood cells - unusually weak or tired, fainting spells, lightheadedness -breathing problems -changes in vision -chest pain -mouth sores -nausea and vomiting -pain, swelling, redness  at site where injected -pain, tingling, numbness in the hands or feet -redness, swelling, or sores on hands or feet -stomach pain -unusual bleeding Side effects that usually do not require medical attention (report to your doctor or health care professional if they continue or are bothersome): -changes in finger or toe nails -diarrhea -dry or itchy skin -hair loss -headache -loss of appetite -sensitivity of eyes to the light -stomach upset -unusually teary eyes This list may not describe all possible side effects. Call your doctor for medical advice about side effects. You may report side effects to FDA at 1-800-FDA-1088. Where should I keep my medicine? This drug is given in a hospital or clinic and will not be stored at home. NOTE: This sheet is a summary. It may not cover all possible information. If you have questions about this medicine, talk to your doctor, pharmacist, or health care provider.  2019 Elsevier/Gold Standard (2007-04-29 13:53:16)

## 2018-03-11 NOTE — Progress Notes (Signed)
Per Vergia Alcon, RPH ok to run leucovorin over 30 minutes for this treatment. Pt educated on plan of care and verbalized understanding and had no further questions.

## 2018-03-11 NOTE — Progress Notes (Signed)
MD ok'd change to Compazine.  LV/5FU has low emetogenic potential Kennith Center, Pharm.D., CPP 03/11/2018@2 :37 PM

## 2018-03-12 ENCOUNTER — Other Ambulatory Visit: Payer: Self-pay | Admitting: Hematology

## 2018-03-12 DIAGNOSIS — N400 Enlarged prostate without lower urinary tract symptoms: Secondary | ICD-10-CM

## 2018-03-12 LAB — CANCER ANTIGEN 19-9: CA 19-9: 75 U/mL — ABNORMAL HIGH (ref 0–35)

## 2018-03-12 MED ORDER — NORMAL SALINE FLUSH 0.9 % IV SOLN: 10.0000 mL | Freq: Every day | INTRAVENOUS | 2 refills | Status: DC

## 2018-03-12 MED ORDER — TAMSULOSIN HCL 0.4 MG PO CAPS: ORAL_CAPSULE | ORAL | 0 refills | Status: AC

## 2018-03-13 ENCOUNTER — Inpatient Hospital Stay: Payer: Medicare Other

## 2018-03-13 VITALS — BP 112/67 | HR 88 | Temp 98.1°F | Resp 19

## 2018-03-13 DIAGNOSIS — D6481 Anemia due to antineoplastic chemotherapy: Secondary | ICD-10-CM | POA: Diagnosis not present

## 2018-03-13 DIAGNOSIS — C221 Intrahepatic bile duct carcinoma: Secondary | ICD-10-CM | POA: Diagnosis not present

## 2018-03-13 DIAGNOSIS — Z95828 Presence of other vascular implants and grafts: Secondary | ICD-10-CM

## 2018-03-13 DIAGNOSIS — Z5111 Encounter for antineoplastic chemotherapy: Secondary | ICD-10-CM | POA: Diagnosis not present

## 2018-03-13 DIAGNOSIS — E119 Type 2 diabetes mellitus without complications: Secondary | ICD-10-CM | POA: Diagnosis not present

## 2018-03-13 DIAGNOSIS — C7972 Secondary malignant neoplasm of left adrenal gland: Secondary | ICD-10-CM | POA: Diagnosis not present

## 2018-03-13 DIAGNOSIS — Z794 Long term (current) use of insulin: Secondary | ICD-10-CM | POA: Diagnosis not present

## 2018-03-13 DIAGNOSIS — I10 Essential (primary) hypertension: Secondary | ICD-10-CM | POA: Diagnosis not present

## 2018-03-13 DIAGNOSIS — Z79899 Other long term (current) drug therapy: Secondary | ICD-10-CM | POA: Diagnosis not present

## 2018-03-13 DIAGNOSIS — C7971 Secondary malignant neoplasm of right adrenal gland: Secondary | ICD-10-CM | POA: Diagnosis not present

## 2018-03-13 MED ORDER — SODIUM CHLORIDE 0.9% FLUSH
10.0000 mL | INTRAVENOUS | Status: DC | PRN
  Administered 2018-03-13: 10 mL
  Filled 2018-03-13: qty 10

## 2018-03-13 MED ORDER — HEPARIN SOD (PORK) LOCK FLUSH 100 UNIT/ML IV SOLN
500.0000 [IU] | Freq: Once | INTRAVENOUS | Status: AC | PRN
  Administered 2018-03-13: 500 [IU]
  Filled 2018-03-13: qty 5

## 2018-03-23 ENCOUNTER — Encounter (HOSPITAL_COMMUNITY): Payer: Self-pay

## 2018-03-23 ENCOUNTER — Other Ambulatory Visit (HOSPITAL_COMMUNITY): Payer: Self-pay | Admitting: Radiology

## 2018-03-23 ENCOUNTER — Ambulatory Visit (HOSPITAL_COMMUNITY)
Admission: RE | Admit: 2018-03-23 | Discharge: 2018-03-23 | Disposition: A | Discharge status: Home / Self Care | Payer: Medicare Other | Source: Ambulatory Visit | Attending: Radiology | Admitting: Radiology

## 2018-03-23 ENCOUNTER — Other Ambulatory Visit: Payer: Self-pay

## 2018-03-23 DIAGNOSIS — K831 Obstruction of bile duct: Secondary | ICD-10-CM

## 2018-03-25 NOTE — Progress Notes (Signed)
[place]   [phone] [phone]   Clinic Follow up Note   Patient Care Team: [name], MD as PCP - General (Internal Medicine) 03/26/2018  CHIEF COMPLAINT: F/u on metastatic cholangiocarcinoma   SUMMARY OF ONCOLOGIC HISTORY: Oncology History   Cancer Staging Intrahepatic cholangiocarcinoma ([place]) Staging form: Intrahepatic Bile Duct, AJCC 8th Edition - Clinical stage from 10/29/2017: Stage IV (cT2, cN1, pM1) - Signed by [name], MD on 11/06/2017       Malignant neoplasm metastatic to adrenal gland [facility])    Initial Diagnosis    Malignant neoplasm metastatic to adrenal gland ([place])    10/23/2017 Imaging    ABD US IMPRESSION: Cirrhotic appearance of the liver.  2.6 cm questionable ill-defined mass versus area of focal parenchymal heterogeneity in segment 6 of the liver. If further imaging evaluation is desired, liver protocol abdominal MRI should be considered.  Splenomegaly.  Apparent thickening of the gallbladder wall, without evidence of cholelithiasis, and with negative sonographic Murphy's sign. The findings are favored to represent gallbladder wall edema secondary to systemic causes such as hyperproteinemia rather than acute cholecystitis. Please correlate clinically.     10/25/2017 Imaging    MRI IMPRESSION: 1. Advanced changes of cirrhosis. Interval development of multifocal enhancing liver lesions, which is concerning for multicentric hepatocellular carcinoma involving both lobes of liver. 2. Lesion within segment 4 B obstructs the right medial hepatic duct and left main duct resulting in moderate to marked biliary ductal dilatation within the left lobe and dome of liver. There may be partial obstruction of the right lateral duct with mild dilatation. 3. Tumor thrombus within the left branch of the portal vein likely secondary to segment 4 B tumor. 4. Bilateral adrenal metastasis and retroperitoneal nodal  metastasis. 5. Stigmata of portal venous hypertension including varices, ascites and splenomegaly. 6. These results will be called to the ordering clinician or representative by the Radiologist Assistant, and communication documented in the PACS or zVision Dashboard.    10/26/2017 Procedure    IMPRESSION: Status post formal PTC and internal/external biliary drainage.    10/27/2017 Imaging    IMPRESSION: Changes of cirrhosis. Multiple hepatic lesions are noted concerning for multifocal hepatoma or metastases. Biliary drain is in place with the tip in the distal common bile duct. There is mild intrahepatic biliary ductal dilatation. Bilateral adrenal nodules and masses, right larger than left compatible with metastases. Retroperitoneal adenopathy. Large stool burden with mild gaseous distention of the colon. Aortic atherosclerosis.     10/29/2017 Pathology Results    Diagnosis Soft Tissue Needle Core Biopsy, right adrenal mass - METASTATIC ADENOCARCINOMA, SEE COMMENT. Microscopic Comment There are malignant glands with central necrosis. Immunohistochemistry is positive for cytokeratin 20 and CDX-2. Cytokeratin 7, TTF-1 and NapsinA are negative. The morphology and immunoprofile favor a colorectal primary over pancreaticobiliary. [name] has reviewed the case. [name] was notified on 10/31/2017.    11/04/2017 Procedure    Colonoscopy impression - Seton in place found on digital rectal exam. - Congested mucosa in the rectum, in the sigmoid colon and in the descending colon. - A few diminutive polyps in the rectum and in the sigmoid colon. No colon or rectal mass identified. - No specimens collected.    02/19/2018 Imaging    CT CAP IMPRESSION: 1. Slight interval increase in size of left adrenal gland metastasis. Similar-appearing right adrenal gland metastasis. 2. While difficult to measure given ill-defined nature, infiltrative mass within the left hepatic lobe is grossly  similar when  compared to prior exam. Additional lesions within the liver are grossly similar. 3. Interval insertion of biliary drain within the left hepatic lobe with resultant decompression of the left hepatic lobe biliary system. 4. Similar-appearing sharply marginated consolidation within the right lower lobe which may represent infectious/inflammatory process or metastatic disease. 5. Large amount of stool throughout the colon as can be seen with constipation. 6. Persistent left hepatic lobe portal venous thrombosis. 7. Splenomegaly. 8. New moderate volume ascites.     Intrahepatic cholangiocarcinoma ([place])   10/27/2017 Imaging    10/27/2017 CT AP IMPRESSION: Changes of cirrhosis. Multiple hepatic lesions are noted concerning for multifocal hepatoma or metastases.  Biliary drain is in place with the tip in the distal common bile duct. There is mild intrahepatic biliary ductal dilatation.  Bilateral adrenal nodules and masses, right larger than left compatible with metastases.  Retroperitoneal adenopathy.  Large stool burden with mild gaseous distention of the colon.  Aortic atherosclerosis.    10/29/2017 Cancer Staging    Staging form: Intrahepatic Bile Duct, AJCC 8th Edition - Clinical stage from 10/29/2017: Stage IV (cT2, cN1, pM1) - Signed by [name], MD on 11/06/2017    10/29/2017 Pathology Results    10/29/2017 Surgical Pathology Diagnosis Soft Tissue Needle Core Biopsy, right adrenal mass - METASTATIC ADENOCARCINOMA, SEE COMMENT.    11/06/2017 Initial Diagnosis    Intrahepatic cholangiocarcinoma ([place])    11/06/2017 Imaging    11/06/2017 CT AP IMPRESSION: 1. Increase in RIGHT pleural effusion. Stable masslike consolidation at the RIGHT lung base representing neoplasm versus pneumonia. 2. Stable metastatic nodule at the RIGHT lung base. 3. Infiltrative mass in the central LEFT hepatic lobe and discrete lesions in the RIGHT hepatic lobe consistent  with metastatic disease versus primary hepatic biliary carcinoma. Favor cholangiocarcinoma. 4. Percutaneous drainage catheter extends the porta hepatis. Small trapped pneumothorax along the tract of the catheter at the inferolateral RIGHT lung base. 5. Stable adrenal metastasis. 6. Metastatic adenopathy in the retroperitoneum unchanged. 7. Morphologic changes in liver consistent with  cirrhosis.    11/08/2017 Imaging    11/08/2017 CT AP IMPRESSION: 1. Overall no significant interval change since the CT of 11/06/2017. Stable positioning of the percutaneous biliary drain with similar degree of biliary ductal dilatation. 2. Cirrhosis with multiple hepatic metastatic disease. Infiltrative appearing lesion in the left lobe of the liver appears to extend into the left portal vein and may represent a tumor thrombus, or bland thrombus. A primary biliary neoplasm (cholangiocarcinoma, or metastatic disease are other considerations. 3. Retroperitoneal, mesenteric, and adrenal metastatic disease. 4. Hepatic colopathy versus less likely segmental colitis of the hepatic flexure. 5. Small right pleural effusion and bilateral lung base pulmonary masses/nodules as seen previously. 6. Tiny pockets of pneumoperitoneum lateral to the liver along the percutaneous drain, decreased since the prior CT. 7. Other findings as above.    11/20/2017 - 11/24/2017 Hospital Admission    Admit date: 11/08/2017 Admission diagnosis: fatigue Additional comments: discharged 11/24/2017    12/18/2017 -  Chemotherapy    first line chemo gemcitabine/cisplatin on days 1 and 15 every 28 days starting 12/18/17    12/22/2017 - 12/30/2017 Hospital Admission    Admit date: 12/22/2017 Admission diagnosis: Dysuria    12/22/2017 Imaging    12/22/2017 CT AP IMPRESSION: 1. Prominently distended and otherwise normal urinary bladder with mild prostatomegaly. Findings suggest acute bladder outlet obstruction. No hydronephrosis.  2. Large colorectal stool volume compatible with constipation. New mild circumferential rectal wall thickening and perirectal fat haziness suggesting stercoral  colitis. No free air. No abscess. 3. Stable infiltrative cholangiocarcinoma in the central left liver lobe with associated peripheral intrahepatic biliary ductal dilatation throughout the left liver lobe. 4. Stable well-positioned internal external drain traversing the right liver lobe and decompressing the right liver biliary system. 5. Stable liver, bilateral adrenal, retroperitoneal, left mesenteric and right infrahilar nodal and left lung base metastases. No progressive metastatic disease since 12/03/2017 CT. 6. Cirrhosis. Trace perihepatic ascites, decreased. Stable moderate splenomegaly. 7.  Aortic Atherosclerosis (ICD10-I70.0).    03/11/2018 -  Chemotherapy    The patient had palonosetron (ALOXI) injection 0.25 mg, 0.25 mg, Intravenous,  Once, 0 of 3 cycles pegfilgrastim-cbqv (UDENYCA) injection 6 mg, 6 mg, Subcutaneous, Once, 0 of 3 cycles leucovorin 840 mg in sodium chloride 0.9 % 250 mL infusion, 400 mg/m2 = 840 mg, Intravenous,  Once, 1 of 4 cycles Administration: 840 mg (03/11/2018) oxaliplatin (ELOXATIN) 145 mg in dextrose 5 % 500 mL chemo infusion, 70 mg/m2 = 145 mg (100 % of original dose 70 mg/m2), Intravenous,  Once, 0 of 3 cycles Dose modification: 70 mg/m2 (original dose 70 mg/m2, Cycle 2, Reason: Provider Judgment, Comment: thrombocytopenia ) fluorouracil (ADRUCIL) 4,200 mg in sodium chloride 0.9 % 66 mL chemo infusion, 2,000 mg/m2 = 4,200 mg (100 % of original dose 2,000 mg/m2), Intravenous, 1 Day/Dose, 1 of 4 cycles Dose modification: 2,000 mg/m2 (original dose 2,000 mg/m2, Cycle 1, Reason: Provider Judgment) Administration: 4,200 mg (03/11/2018)  for chemotherapy treatment.      CURRENT THERAPY FOLFOX every 2 weeks started 03/11/2018  INTERVAL HISTORY: JOHAAN RYSER is a 63 y.o. male who is here for  follow-up. He started his first week of FOLFOX chemotherapy on 03/11/2018. Today, he is here alone.Since last seen he has lost 14lbs in 3 weeks. He has normal appetite and good energy levels. He frequently exercises by walking around the park. His left drain tube has decreased output and his right drain tube is capped. He is tolerating chemotherapy treatment well. After treatment he was tired for about 3-4 days but then his energy levels improved after. He denies cp, n/v/d or any other problems due to chemotherapy.   Pertinent positives and negatives of review of systems are listed and detailed within the above HPI.  REVIEW OF SYSTEMS:   Constitutional: Denies fevers, chills, (+) abnormal weight loss (14lbs)  Eyes: Denies blurriness of vision Ears, nose, mouth, throat, and face: Denies mucositis or sore throat Respiratory: Denies cough, dyspnea or wheezes Cardiovascular: Denies palpitation, chest discomfort or lower extremity swelling Gastrointestinal:  Denies nausea, heartburn or change in bowel habits Skin: Denies abnormal skin rashes Lymphatics: Denies new lymphadenopathy or easy bruising Neurological:Denies numbness, tingling or new weaknesses Behavioral/Psych: Mood is stable, no new changes  All other systems were reviewed with the patient and are negative.  MEDICAL HISTORY:  Past Medical History:  Diagnosis Date  . Abscess of anal and rectal regions    horse shoe abscess  . Alcoholic cirrhosis ([place])   . Arthritis    "everywhere; especially in my spine"  . Cirrhosis ([place])    STAGE 1 CIRRHOSIS PATIENT SEES DR NAMDIGAM FOR  . Colon cancer ([place]) 05/2005   TX SURGERY WITH LYMPH NODE REMOVAL  . Cough    LAST FEW WEEKS SAW DR RAMOS LUNGS MILKY SPUTUM OCC  . DM type 2 with diabetic peripheral neuropathy ([facility])    left foot  . Fatigue   . Fistula, anal    multiple  . History of substance abuse ([place])  last alcohol was 08/2011; last marijuania was 08/2011  . Hypertension   . PTSD  (post-traumatic stress disorder)    "severe" HX OF  . Sleep deprivation     SURGICAL HISTORY: Past Surgical History:  Procedure Laterality Date  . ABSCESS DRAINAGE     "probably 15-20 so far at least; rectal"  . ANAL FISTULECTOMY  12/13/1998  . ANTERIOR FUSION CERVICAL SPINE  2010   "triple"; "did 2 surgeries on the same day in [id number] due to complications"  . CORONARY ANGIOPLASTY  2001  . detached muscle  2010   right chest; "after cervical fusion complications"  . ELBOW SURGERY  2007   "cut out part of a muscle"; left  . ESOPHAGEAL BANDING  02/25/2018   Procedure: ESOPHAGEAL BANDING;  Surgeon: Ladene Artist, MD;  Location: [facility] ENDOSCOPY;  Service: Endoscopy;;  . ESOPHAGOGASTRODUODENOSCOPY (EGD) WITH PROPOFOL N/A 02/25/2018   Procedure: ESOPHAGOGASTRODUODENOSCOPY (EGD) WITH PROPOFOL;  Surgeon: Ladene Artist, MD;  Location: [facility] ENDOSCOPY;  Service: Endoscopy;  Laterality: N/A;  . EXAMINATION UNDER ANESTHESIA  03/22/2005   fistula  . EXAMINATION UNDER ANESTHESIA  06/06/2011   Procedure: EXAM UNDER ANESTHESIA;  Surgeon: Stark Klein, MD;  Location: [place];  Service: General;  Laterality: N/A;  . HEMICOLECTOMY  06/06/2005   left  . INCISION AND DRAINAGE PERIRECTAL ABSCESS  03/30/2005  . INCISION AND DRAINAGE PERIRECTAL ABSCESS  12/13/2004  . INCISION AND DRAINAGE PERIRECTAL ABSCESS  07/14/2001  . INCISION AND DRAINAGE PERIRECTAL ABSCESS  08/12/2010   horseshoe abscess; Dr Redmond Pulling  . INCISION AND DRAINAGE PERIRECTAL ABSCESS  06/06/2011   Procedure: IRRIGATION AND DEBRIDEMENT PERIRECTAL ABSCESS;  Surgeon: Stark Klein, MD;  Location: [place];  Service: General;  Laterality: N/A;  . INCISION AND DRAINAGE PERIRECTAL ABSCESS N/A 02/24/2017   Procedure: IRRIGATION AND DEBRIDEMENT PERIRECTAL ABSCESS;  Surgeon: Ralene Ok, MD;  Location: [place];  Service: General;  Laterality: N/A;  . IR CATHETER TUBE CHANGE  02/19/2018  . IR CHOLANGIOGRAM EXISTING TUBE  12/23/2017  . IR CHOLANGIOGRAM EXISTING TUBE   01/02/2018  . IR EXCHANGE BILIARY DRAIN  11/07/2017  . IR EXCHANGE BILIARY DRAIN  12/09/2017  . IR EXCHANGE BILIARY DRAIN  02/03/2018  . IR EXCHANGE BILIARY DRAIN  02/03/2018  . IR IMAGING GUIDED PORT INSERTION  12/09/2017  . IR INT EXT BILIARY DRAIN WITH CHOLANGIOGRAM  10/26/2017  . IR INT EXT BILIARY DRAIN WITH CHOLANGIOGRAM  12/23/2017  . IR PARACENTESIS  02/25/2018  . IR PATIENT EVAL TECH 0-60 MINS  12/02/2017  . IR RADIOLOGIST EVAL & MGMT  11/06/2017  . IR RADIOLOGIST EVAL & MGMT  01/05/2018  . [place]  . PERCUTANEOUS PINNING PHALANX FRACTURE OF HAND  ~ 2008   "plates in 2 places"; right  . PLACEMENT OF SETON N/A 09/05/2017   Procedure: PLACEMENT OF SETON;  Surgeon: Leighton Ruff, MD;  Location: [facility];  Service: General;  Laterality: N/A;  . RADIOLOGY WITH ANESTHESIA N/A 10/25/2017   Procedure: MRI WITH ANESTHESIA;  Surgeon: Radiologist, Medication, MD;  Location: [place];  Service: Radiology;  Laterality: N/A;  . THORACIC DISCECTOMY  1990's    I have reviewed the social history and family history with the patient and they are unchanged from previous note.  ALLERGIES:  is allergic to morphine and related.  MEDICATIONS:  Current Outpatient Medications  Medication Sig Dispense Refill  . aspirin EC 81 MG tablet Take 1 tablet (81 mg total) by mouth daily.    Marland Kitchen  Ca Carbonate-Mag Hydroxide (ROLAIDS PO) Take 1-2 tablets by mouth 2 (two) times daily as needed (heartburn).    . fexofenadine (ALLEGRA) 180 MG tablet Take 180 mg by mouth at bedtime.    . gabapentin (NEURONTIN) 400 MG capsule Take 400-800 mg by mouth See admin instructions. Take 400 mg by mouth in the morning and 800 mg in the evening    . insulin detemir (LEVEMIR) 100 UNIT/ML injection Inject 10-62 Units into the skin See admin instructions. Take 62 in the am and 10 in the pm    . lactose free nutrition (BOOST PLUS) LIQD Take 237 mLs by mouth 3 (three) times daily with meals. (Patient taking  differently: Take 237 mLs by mouth 3 (three) times daily with meals. Ensure chocolate and vanilla max protein) 30 Can 0  . lidocaine-prilocaine (EMLA) cream Apply to port site 1 hour prior to access (Patient taking differently: Apply 1 application topically once. Apply to port site 1 hour prior to access) 30 g 0  . Multiple Vitamin (MULTIVITAMIN) tablet Take 1 tablet by mouth daily.    . pantoprazole (PROTONIX) 40 MG tablet Take 1 tablet (40 mg total) by mouth 2 (two) times daily. 60 tablet 0  . propranolol ER (INDERAL LA) 60 MG 24 hr capsule Take 1 capsule (60 mg total) by mouth daily for 30 days. 30 capsule 0  . Sodium Chloride Flush (NORMAL SALINE FLUSH) 0.9 % SOLN Inject 10 mLs into the vein daily. 30 Syringe 2  . tamsulosin (FLOMAX) 0.4 MG CAPS capsule TAKE 1 CAPSULE(0.4 MG) BY MOUTH DAILY AFTER SUPPER 90 capsule 0   No current facility-administered medications for this visit.     PHYSICAL EXAMINATION: ECOG PERFORMANCE STATUS: 1 - Symptomatic but completely ambulatory  Vitals:   03/26/18 0914  BP: 111/76  Pulse: 78  Resp: 18  Temp: 98 F (36.7 C)  SpO2: 100%   Filed Weights   03/26/18 0914  Weight: 187 lb 1.6 oz (84.9 kg)    GENERAL:alert, no distress and comfortable SKIN: skin color, texture, turgor are normal, no rashes or significant lesions EYES: normal, Conjunctiva are pink and non-injected, sclera clear OROPHARYNX:no exudate, no erythema and lips, buccal mucosa, and tongue normal  NECK: supple, thyroid normal size, non-tender, without nodularity LYMPH:  no palpable lymphadenopathy in the cervical, axillary or inguinal LUNGS: clear to auscultation and percussion with normal breathing effort HEART: regular rate & rhythm and no murmurs and no lower extremity edema ABDOMEN:abdomen soft, non-tender and normal bowel sounds Musculoskeletal:no cyanosis of digits and no clubbing  NEURO: alert & oriented x 3 with fluent speech, no focal motor/sensory deficits  LABORATORY  DATA:  I have reviewed the data as listed CBC Latest Ref Rng & Units 03/26/2018 03/11/2018 03/05/2018  WBC 4.0 - 10.5 K/uL 7.3 13.6(H) 14.2(H)  Hemoglobin 13.0 - 17.0 g/dL 10.3(L) 9.5(L) 9.8(L)  Hematocrit 39.0 - 52.0 % 32.4(L) 29.2(L) 30.6(L)  Platelets 150 - 400 K/uL 126(L) 160 205     CMP Latest Ref Rng & Units 03/26/2018 03/11/2018 03/05/2018  Glucose 70 - 99 mg/dL 265(H) 173(H) 201(H)  BUN 8 - 23 mg/dL 15 13 11   Creatinine 0.61 - 1.24 mg/dL 0.86 0.75 0.73  Sodium 135 - 145 mmol/L 133(L) 128(L) 131(L)  Potassium 3.5 - 5.1 mmol/L 4.2 4.0 4.2  Chloride 98 - 111 mmol/L 103 99 99  CO2 22 - 32 mmol/L 22 23 23   Calcium 8.9 - 10.3 mg/dL 9.1 8.6(L) 8.4(L)  Total Protein 6.5 - 8.1 g/dL 8.5(H)  7.8 7.1  Total Bilirubin 0.3 - 1.2 mg/dL 1.2 1.7(H) 1.3(H)  Alkaline Phos 38 - 126 U/L 405(H) 640(H) 493(H)  AST 15 - 41 U/L 37 55(H) 35  ALT 0 - 44 U/L 31 57(H) 74(H)      RADIOGRAPHIC STUDIES: I have personally reviewed the radiological images as listed and agreed with the findings in the report. No results found.   ASSESSMENT & PLAN:  SUHAAN PERLEBERG is a 63 y.o. male with history of  1. Metastaticintrahepatic cholangiocarcinomato right adrenal gland, liver, lungs and nodes - Diagnosed in 10/2017. He had 2 biliary drainage placements. His cancer treatment was postponed due to multiple hospitalization for infection, dehydration, it etc. He was onfirst lineCisplatin and gemcitabine stopped on 02/20/2018 because his restaging CT scan showed enlarged  adrenal glad metastasis consistent disease progression.  - He is recovering well from recent hospitalization due to upper  GI bleeding from duodenal ulcers, endoscopy showed no active bleeding, he is on PPI.  - He started second line treatment FOLFOX on 03/11/2018 and was only given 5-FU and leucovorin on for first cycle due to recent GI bleeding and will continue treatment every 2 weeks.  - He is clinically doing well and is tolerating chemotherapy  well. He denies any  problems due to chemotherapy except fatigue for 3-4 days after treatment.  -Labs reviewed, RBC 3.36, Hg 10.3, HCT 32.4, RDW 16.3, Platelet Count 126. CMP is pending. His labs are adequate to continue with FOLFOX chemotherapy treatment today. Today we will add oxaliplatin to the regiment, with dose reduction, due to his liver cirrhosis and mild thrombocytopenia from chemo -I again encouraged him to eat well, and continue exercise.  Follow-up in 2 weeks before next cycle chemo  2.Obstructive jaundice, status post bilateral percutaneous biliary drain -drained tube exchange1/28, both were capped. Due to bilary leakage from left side this morning, the left drain tube is connected to a bag now,it is draining well, and his output has decreased. - His right drain tube is capped and his left drain tube has decrease output  -f/u with IR -continue monitoring LFTs   3.Liver cirrhosis secondary to alcohol and NASH -Stable  4. Portal vein thrombosis -He is on aspirin, no anticoagulation due to initial bloody biliary drainage  5. DM, HTN -f/u with PCP  6.H/o substance abuse and addiction -He's been clean for 6 years now -will becautious about narcoticsandbenzo.  7. Anemia  -Secondary to chemotherapy, recent GI bleeding and underlying malignancy -He received 5 unit blood transfusion during his hospitalization for GI bleeding, anemia improved.  8. GI bleeding from duodenal ulcers -Patient developed upper GI bleeding on February 24, 2018, required hospitalization, and multiple blood transfusion. - Bleeding has stopped and is possibly related to NSAIDs -Continue Protonix twice daily indefinitely per GI Dr. Fuller Plan   9.Goal of care discussion, DNR/DNI -Wepreviouslydiscussed code status in length.I recommend DNR/DNI,he agrees. -He understands his cancer is not curable, and the goal of therapy is to prolong his life and improve his quality of life.    Plan - I reviewed labs today, adequate to continue FOLFOX chemotherapy today and every 2 weeks, will reduce oxaliplatin dose due to his underlying liver cirrhosis, and thrombocytopenia, will add Udenyca on day 3 -f/u in 2 weeks   No problem-specific Assessment & Plan notes found for this encounter.   No orders of the defined types were placed in this encounter.  All questions were answered. The patient knows to call the clinic with any problems,  questions or concerns. No barriers to learning was detected. I spent 20 minutes counseling the patient face to face. The total time spent in the appointment was 25 minutes and more than 50% was on counseling and review of test results  I, Manson Allan am acting as scribe for Dr. [name].  I have reviewed the above documentation for accuracy and completeness, and I agree with the above.     [name], MD 03/26/2018

## 2018-03-26 ENCOUNTER — Other Ambulatory Visit: Payer: Self-pay

## 2018-03-26 ENCOUNTER — Inpatient Hospital Stay: Payer: Medicare Other

## 2018-03-26 ENCOUNTER — Encounter: Payer: Self-pay | Admitting: Hematology

## 2018-03-26 ENCOUNTER — Telehealth: Payer: Self-pay | Admitting: Hematology

## 2018-03-26 ENCOUNTER — Inpatient Hospital Stay (HOSPITAL_BASED_OUTPATIENT_CLINIC_OR_DEPARTMENT_OTHER): Payer: Medicare Other | Admitting: Hematology

## 2018-03-26 VITALS — BP 111/76 | HR 78 | Temp 98.0°F | Resp 18 | Ht 72.0 in | Wt 187.1 lb

## 2018-03-26 DIAGNOSIS — C7971 Secondary malignant neoplasm of right adrenal gland: Secondary | ICD-10-CM | POA: Diagnosis not present

## 2018-03-26 DIAGNOSIS — E119 Type 2 diabetes mellitus without complications: Secondary | ICD-10-CM | POA: Diagnosis not present

## 2018-03-26 DIAGNOSIS — Z794 Long term (current) use of insulin: Secondary | ICD-10-CM | POA: Diagnosis not present

## 2018-03-26 DIAGNOSIS — R634 Abnormal weight loss: Secondary | ICD-10-CM

## 2018-03-26 DIAGNOSIS — K7030 Alcoholic cirrhosis of liver without ascites: Secondary | ICD-10-CM

## 2018-03-26 DIAGNOSIS — C7972 Secondary malignant neoplasm of left adrenal gland: Secondary | ICD-10-CM | POA: Diagnosis not present

## 2018-03-26 DIAGNOSIS — Z5111 Encounter for antineoplastic chemotherapy: Secondary | ICD-10-CM | POA: Diagnosis not present

## 2018-03-26 DIAGNOSIS — Z7189 Other specified counseling: Secondary | ICD-10-CM

## 2018-03-26 DIAGNOSIS — C221 Intrahepatic bile duct carcinoma: Secondary | ICD-10-CM

## 2018-03-26 DIAGNOSIS — Z79899 Other long term (current) drug therapy: Secondary | ICD-10-CM | POA: Diagnosis not present

## 2018-03-26 DIAGNOSIS — D6481 Anemia due to antineoplastic chemotherapy: Secondary | ICD-10-CM | POA: Diagnosis not present

## 2018-03-26 DIAGNOSIS — Z95828 Presence of other vascular implants and grafts: Secondary | ICD-10-CM

## 2018-03-26 DIAGNOSIS — I10 Essential (primary) hypertension: Secondary | ICD-10-CM | POA: Diagnosis not present

## 2018-03-26 LAB — CBC WITH DIFFERENTIAL (CANCER CENTER ONLY)
ABS IMMATURE GRANULOCYTES: 0.02 10*3/uL (ref 0.00–0.07)
Basophils Absolute: 0 10*3/uL (ref 0.0–0.1)
Basophils Relative: 0 %
Eosinophils Absolute: 0.5 10*3/uL (ref 0.0–0.5)
Eosinophils Relative: 6 %
HCT: 32.4 % — ABNORMAL LOW (ref 39.0–52.0)
Hemoglobin: 10.3 g/dL — ABNORMAL LOW (ref 13.0–17.0)
Immature Granulocytes: 0 %
Lymphocytes Relative: 11 %
Lymphs Abs: 0.8 10*3/uL (ref 0.7–4.0)
MCH: 30.7 pg (ref 26.0–34.0)
MCHC: 31.8 g/dL (ref 30.0–36.0)
MCV: 96.4 fL (ref 80.0–100.0)
Monocytes Absolute: 0.7 10*3/uL (ref 0.1–1.0)
Monocytes Relative: 10 %
NRBC: 0 % (ref 0.0–0.2)
Neutro Abs: 5.3 10*3/uL (ref 1.7–7.7)
Neutrophils Relative %: 73 %
Platelet Count: 126 10*3/uL — ABNORMAL LOW (ref 150–400)
RBC: 3.36 MIL/uL — ABNORMAL LOW (ref 4.22–5.81)
RDW: 16.3 % — ABNORMAL HIGH (ref 11.5–15.5)
WBC Count: 7.3 10*3/uL (ref 4.0–10.5)

## 2018-03-26 LAB — CMP (CANCER CENTER ONLY)
ALT: 31 U/L (ref 0–44)
AST: 37 U/L (ref 15–41)
Albumin: 2.8 g/dL — ABNORMAL LOW (ref 3.5–5.0)
Alkaline Phosphatase: 405 U/L — ABNORMAL HIGH (ref 38–126)
Anion gap: 8 (ref 5–15)
BUN: 15 mg/dL (ref 8–23)
CO2: 22 mmol/L (ref 22–32)
Calcium: 9.1 mg/dL (ref 8.9–10.3)
Chloride: 103 mmol/L (ref 98–111)
Creatinine: 0.86 mg/dL (ref 0.61–1.24)
GFR, Est AFR Am: >60 mL/min (ref >60)
GFR, Estimated: >60 mL/min (ref >60)
Glucose, Bld: 265 mg/dL — ABNORMAL HIGH (ref 70–99)
POTASSIUM: 4.2 mmol/L (ref 3.5–5.1)
Sodium: 133 mmol/L — ABNORMAL LOW (ref 135–145)
Total Bilirubin: 1.2 mg/dL (ref 0.3–1.2)
Total Protein: 8.5 g/dL — ABNORMAL HIGH (ref 6.5–8.1)

## 2018-03-26 MED ORDER — DEXTROSE 5 % IV SOLN
Freq: Once | INTRAVENOUS | Status: AC
  Administered 2018-03-26: 10:00:00 via INTRAVENOUS
  Filled 2018-03-26: qty 250

## 2018-03-26 MED ORDER — LEUCOVORIN CALCIUM INJECTION 350 MG
400.0000 mg/m2 | Freq: Once | INTRAVENOUS | Status: AC
  Administered 2018-03-26: 840 mg via INTRAVENOUS
  Filled 2018-03-26: qty 42

## 2018-03-26 MED ORDER — DEXAMETHASONE SODIUM PHOSPHATE 10 MG/ML IJ SOLN
INTRAMUSCULAR | Status: AC
  Filled 2018-03-26: qty 1

## 2018-03-26 MED ORDER — PALONOSETRON HCL INJECTION 0.25 MG/5ML
INTRAVENOUS | Status: AC
  Filled 2018-03-26: qty 5

## 2018-03-26 MED ORDER — OXALIPLATIN CHEMO INJECTION 100 MG/20ML
71.0000 mg/m2 | Freq: Once | INTRAVENOUS | Status: AC
  Administered 2018-03-26: 150 mg via INTRAVENOUS
  Filled 2018-03-26: qty 20

## 2018-03-26 MED ORDER — SODIUM CHLORIDE 0.9% FLUSH
10.0000 mL | INTRAVENOUS | Status: DC | PRN
  Administered 2018-03-26: 10 mL
  Filled 2018-03-26: qty 10

## 2018-03-26 MED ORDER — SODIUM CHLORIDE 0.9 % IV SOLN
2380.0000 mg/m2 | INTRAVENOUS | Status: DC
  Administered 2018-03-26: 5000 mg via INTRAVENOUS
  Filled 2018-03-26: qty 100

## 2018-03-26 MED ORDER — DEXAMETHASONE SODIUM PHOSPHATE 10 MG/ML IJ SOLN: 10.0000 mg | Freq: Once | INTRAMUSCULAR | Status: DC

## 2018-03-26 MED ORDER — PALONOSETRON HCL INJECTION 0.25 MG/5ML
0.2500 mg | Freq: Once | INTRAVENOUS | Status: AC
  Administered 2018-03-26: 0.25 mg via INTRAVENOUS

## 2018-03-26 MED FILL — NORMAL SALINE FLUSH SYRINGE: 0.9 | 30 days supply | Qty: 300 | Fill #0

## 2018-03-26 MED FILL — TAMSULOSIN HCL 0.4 MG CAP: 0.4 | 90 days supply | Qty: 90 | Fill #0

## 2018-03-26 NOTE — Patient Instructions (Signed)
[facility] Discharge Instructions for Patients Receiving Chemotherapy  Today you received the following chemotherapy agents Oxaliplatin (ELOXATIN), Leucovorin & Flourouracil (ADRUCIL).  To help prevent nausea and vomiting after your treatment, we encourage you to take your nausea medication as prescribed.   If you develop nausea and vomiting that is not controlled by your nausea medication, call the clinic.   BELOW ARE SYMPTOMS THAT SHOULD BE REPORTED IMMEDIATELY:  *FEVER GREATER THAN 100.5 F  *CHILLS WITH OR WITHOUT FEVER  NAUSEA AND VOMITING THAT IS NOT CONTROLLED WITH YOUR NAUSEA MEDICATION  *UNUSUAL SHORTNESS OF BREATH  *UNUSUAL BRUISING OR BLEEDING  TENDERNESS IN MOUTH AND THROAT WITH OR WITHOUT PRESENCE OF ULCERS  *URINARY PROBLEMS  *BOWEL PROBLEMS  UNUSUAL RASH Items with * indicate a potential emergency and should be followed up as soon as possible.  Feel free to call the clinic should you have any questions or concerns. The clinic phone number is [phone].  Please show the [facility] at check-in to the Emergency Department and triage nurse.  Oxaliplatin Injection What is this medicine? OXALIPLATIN (ox AL i PLA tin) is a chemotherapy drug. It targets fast dividing cells, like cancer cells, and causes these cells to die. This medicine is used to treat cancers of the colon and rectum, and many other cancers. This medicine may be used for other purposes; ask your health care provider or pharmacist if you have questions. COMMON BRAND NAME(S): Eloxatin What should I tell my health care provider before I take this medicine? They need to know if you have any of these conditions: -kidney disease -an unusual or allergic reaction to oxaliplatin, other chemotherapy, other medicines, foods, dyes, or preservatives -pregnant or trying to get pregnant -breast-feeding How should I use this medicine? This drug is given as an infusion into a vein. It is  administered in a hospital or clinic by a specially trained health care professional. Talk to your pediatrician regarding the use of this medicine in children. Special care may be needed. Overdosage: If you think you have taken too much of this medicine contact a poison control center or emergency room at once. NOTE: This medicine is only for you. Do not share this medicine with others. What if I miss a dose? It is important not to miss a dose. Call your doctor or health care professional if you are unable to keep an appointment. What may interact with this medicine? -medicines to increase blood counts like filgrastim, pegfilgrastim, sargramostim -probenecid -some antibiotics like amikacin, gentamicin, neomycin, polymyxin B, streptomycin, tobramycin -zalcitabine Talk to your doctor or health care professional before taking any of these medicines: -acetaminophen -aspirin -ibuprofen -ketoprofen -naproxen This list may not describe all possible interactions. Give your health care provider a list of all the medicines, herbs, non-prescription drugs, or dietary supplements you use. Also tell them if you smoke, drink alcohol, or use illegal drugs. Some items may interact with your medicine. What should I watch for while using this medicine? Your condition will be monitored carefully while you are receiving this medicine. You will need important blood work done while you are taking this medicine. This medicine can make you more sensitive to cold. Do not drink cold drinks or use ice. Cover exposed skin before coming in contact with cold temperatures or cold objects. When out in cold weather wear warm clothing and cover your mouth and nose to warm the air that goes into your lungs. Tell your doctor if you get sensitive to  the cold. This drug may make you feel generally unwell. This is not uncommon, as chemotherapy can affect healthy cells as well as cancer cells. Report any side effects. Continue your  course of treatment even though you feel ill unless your doctor tells you to stop. In some cases, you may be given additional medicines to help with side effects. Follow all directions for their use. Call your doctor or health care professional for advice if you get a fever, chills or sore throat, or other symptoms of a cold or flu. Do not treat yourself. This drug decreases your body's ability to fight infections. Try to avoid being around people who are sick. This medicine may increase your risk to bruise or bleed. Call your doctor or health care professional if you notice any unusual bleeding. Be careful brushing and flossing your teeth or using a toothpick because you may get an infection or bleed more easily. If you have any dental work done, tell your dentist you are receiving this medicine. Avoid taking products that contain aspirin, acetaminophen, ibuprofen, naproxen, or ketoprofen unless instructed by your doctor. These medicines may hide a fever. Do not become pregnant while taking this medicine. Women should inform their doctor if they wish to become pregnant or think they might be pregnant. There is a potential for serious side effects to an unborn child. Talk to your health care professional or pharmacist for more information. Do not breast-feed an infant while taking this medicine. Call your doctor or health care professional if you get diarrhea. Do not treat yourself. What side effects may I notice from receiving this medicine? Side effects that you should report to your doctor or health care professional as soon as possible: -allergic reactions like skin rash, itching or hives, swelling of the face, lips, or tongue -low blood counts - This drug may decrease the number of white blood cells, red blood cells and platelets. You may be at increased risk for infections and bleeding. -signs of infection - fever or chills, cough, sore throat, pain or difficulty passing urine -signs of decreased  platelets or bleeding - bruising, pinpoint red spots on the skin, black, tarry stools, nosebleeds -signs of decreased red blood cells - unusually weak or tired, fainting spells, lightheadedness -breathing problems -chest pain, pressure -cough -diarrhea -jaw tightness -mouth sores -nausea and vomiting -pain, swelling, redness or irritation at the injection site -pain, tingling, numbness in the hands or feet -problems with balance, talking, walking -redness, blistering, peeling or loosening of the skin, including inside the mouth -trouble passing urine or change in the amount of urine Side effects that usually do not require medical attention (report to your doctor or health care professional if they continue or are bothersome): -changes in vision -constipation -hair loss -loss of appetite -metallic taste in the mouth or changes in taste -stomach pain This list may not describe all possible side effects. Call your doctor for medical advice about side effects. You may report side effects to FDA at 1-800-FDA-1088. Where should I keep my medicine? This drug is given in a hospital or clinic and will not be stored at home. NOTE: This sheet is a summary. It may not cover all possible information. If you have questions about this medicine, talk to your doctor, pharmacist, or health care provider.  2019 Elsevier/Gold Standard (2007-07-21 17:22:47)  Coronavirus (COVID-19) Are you at risk?  Are you at risk for the Coronavirus (COVID-19)?  To be considered HIGH RISK for Coronavirus (COVID-19), you have to  meet the following criteria:  . Traveled to [place], [place], [place], [place] or [place]; or in the [place] to [place], [place], [place], or [place]; and have fever, cough, and shortness of breath within the last 2 weeks of travel OR . Been in close contact with a person diagnosed with COVID-19 within the last 2 weeks and have fever, cough, and shortness of breath . IF YOU DO NOT  MEET THESE CRITERIA, YOU ARE CONSIDERED LOW RISK FOR COVID-19.  What to do if you are HIGH RISK for COVID-19?  Marland Kitchen If you are having a medical emergency, call 911. . Seek medical care right away. Before you go to a doctor's office, urgent care or emergency department, call ahead and tell them about your recent travel, contact with someone diagnosed with COVID-19, and your symptoms. You should receive instructions from your physician's office regarding next steps of care.  . When you arrive at healthcare provider, tell the healthcare staff immediately you have returned from visiting [place], [place], [place], [place] or [place]; or traveled in the [place] to [place], [place], [place], or [place]; in the last two weeks or you have been in close contact with a person diagnosed with COVID-19 in the last 2 weeks.   . Tell the health care staff about your symptoms: fever, cough and shortness of breath. . After you have been seen by a medical provider, you will be either: o Tested for (COVID-19) and discharged home on quarantine except to seek medical care if symptoms worsen, and asked to  - Stay home and avoid contact with others until you get your results (4-5 days)  - Avoid travel on public transportation if possible (such as bus, train, or airplane) or o Sent to the Emergency Department by EMS for evaluation, COVID-19 testing, and possible admission depending on your condition and test results.  What to do if you are LOW RISK for COVID-19?  Reduce your risk of any infection by using the same precautions used for avoiding the common cold or flu:  Marland Kitchen Wash your hands often with soap and warm water for at least 20 seconds.  If soap and water are not readily available, use an alcohol-based hand sanitizer with at least 60% alcohol.  . If coughing or sneezing, cover your mouth and nose by coughing or sneezing into the elbow areas of your shirt or coat, into a tissue or into your sleeve (not your  hands). . Avoid shaking hands with others and consider head nods or verbal greetings only. . Avoid touching your eyes, nose, or mouth with unwashed hands.  . Avoid close contact with people who are sick. . Avoid places or events with large numbers of people in one location, like concerts or sporting events. . Carefully consider travel plans you have or are making. . If you are planning any travel outside or inside the [place], visit the CDC's Travelers' Health webpage for the latest health notices. . If you have some symptoms but not all symptoms, continue to monitor at home and seek medical attention if your symptoms worsen. . If you are having a medical emergency, call 911.   [place] / e-Visit: [email or web address]         [facility]: [facility]: [phone]                   [facility]: [phone]

## 2018-03-26 NOTE — Telephone Encounter (Signed)
[place] appt per 3/19 los.

## 2018-03-27 ENCOUNTER — Telehealth: Payer: Self-pay

## 2018-03-27 LAB — CANCER ANTIGEN 19-9: CA 19-9: 68 U/mL — ABNORMAL HIGH (ref 0–35)

## 2018-03-27 NOTE — Telephone Encounter (Signed)
-----   Message from Georgianne Fick, RN sent at 03/26/2018  2:37 PM EDT ----- Regarding: [name] first time Oxaliplatin Patient received first time Oxaliplatin today and tolerated this well.

## 2018-03-27 NOTE — Telephone Encounter (Signed)
Spoke with patient after receiving Oxaliplatin yesterday for the first time.  He states he is doing well, mild nausea, but it has subsided, no vomiting, no diarrhea, no increased pain.

## 2018-03-28 ENCOUNTER — Inpatient Hospital Stay: Payer: Medicare Other

## 2018-03-28 ENCOUNTER — Other Ambulatory Visit: Payer: Self-pay

## 2018-03-28 ENCOUNTER — Other Ambulatory Visit: Payer: Self-pay | Admitting: Hematology

## 2018-03-28 VITALS — BP 94/61 | HR 84 | Temp 98.9°F | Resp 18

## 2018-03-28 DIAGNOSIS — Z7189 Other specified counseling: Secondary | ICD-10-CM

## 2018-03-28 DIAGNOSIS — D6481 Anemia due to antineoplastic chemotherapy: Secondary | ICD-10-CM | POA: Diagnosis not present

## 2018-03-28 DIAGNOSIS — Z79899 Other long term (current) drug therapy: Secondary | ICD-10-CM | POA: Diagnosis not present

## 2018-03-28 DIAGNOSIS — E119 Type 2 diabetes mellitus without complications: Secondary | ICD-10-CM | POA: Diagnosis not present

## 2018-03-28 DIAGNOSIS — C7971 Secondary malignant neoplasm of right adrenal gland: Secondary | ICD-10-CM | POA: Diagnosis not present

## 2018-03-28 DIAGNOSIS — Z794 Long term (current) use of insulin: Secondary | ICD-10-CM | POA: Diagnosis not present

## 2018-03-28 DIAGNOSIS — C221 Intrahepatic bile duct carcinoma: Secondary | ICD-10-CM | POA: Diagnosis not present

## 2018-03-28 DIAGNOSIS — C7972 Secondary malignant neoplasm of left adrenal gland: Secondary | ICD-10-CM | POA: Diagnosis not present

## 2018-03-28 DIAGNOSIS — I10 Essential (primary) hypertension: Secondary | ICD-10-CM | POA: Diagnosis not present

## 2018-03-28 DIAGNOSIS — Z5111 Encounter for antineoplastic chemotherapy: Secondary | ICD-10-CM | POA: Diagnosis not present

## 2018-03-28 MED ORDER — HEPARIN SOD (PORK) LOCK FLUSH 100 UNIT/ML IV SOLN
500.0000 [IU] | Freq: Once | INTRAVENOUS | Status: AC | PRN
  Administered 2018-03-28: 500 [IU]
  Filled 2018-03-28: qty 5

## 2018-03-28 MED ORDER — SODIUM CHLORIDE 0.9% FLUSH
10.0000 mL | INTRAVENOUS | Status: DC | PRN
  Administered 2018-03-28: 10 mL
  Filled 2018-03-28: qty 10

## 2018-03-28 MED ORDER — PEGFILGRASTIM-CBQV 6 MG/0.6ML ~~LOC~~ SOSY
PREFILLED_SYRINGE | SUBCUTANEOUS | Status: AC
  Filled 2018-03-28: qty 0.6

## 2018-03-28 MED ORDER — PEGFILGRASTIM-CBQV 6 MG/0.6ML ~~LOC~~ SOSY
6.0000 mg | PREFILLED_SYRINGE | Freq: Once | SUBCUTANEOUS | Status: AC
  Administered 2018-03-28: 6 mg via SUBCUTANEOUS

## 2018-03-28 NOTE — Progress Notes (Signed)
Pt. Given Udenyca per Dr. Ernestina Penna  Note on 03/26/18

## 2018-04-02 ENCOUNTER — Other Ambulatory Visit (HOSPITAL_COMMUNITY): Payer: Self-pay | Admitting: Diagnostic Radiology

## 2018-04-02 ENCOUNTER — Emergency Department (HOSPITAL_COMMUNITY)
Admission: EM | Admit: 2018-04-02 | Discharge: 2018-04-02 | Discharge status: Absent without leave | Payer: Medicare Other

## 2018-04-02 ENCOUNTER — Other Ambulatory Visit: Payer: Self-pay

## 2018-04-02 DIAGNOSIS — K831 Obstruction of bile duct: Secondary | ICD-10-CM

## 2018-04-03 ENCOUNTER — Other Ambulatory Visit (HOSPITAL_COMMUNITY): Payer: Self-pay | Admitting: Diagnostic Radiology

## 2018-04-03 ENCOUNTER — Ambulatory Visit (HOSPITAL_COMMUNITY)
Admission: RE | Admit: 2018-04-03 | Discharge: 2018-04-03 | Disposition: A | Discharge status: Home / Self Care | Payer: Medicare Other | Source: Ambulatory Visit | Attending: Diagnostic Radiology | Admitting: Diagnostic Radiology

## 2018-04-03 ENCOUNTER — Encounter (HOSPITAL_COMMUNITY): Payer: Self-pay | Admitting: Diagnostic Radiology

## 2018-04-03 ENCOUNTER — Other Ambulatory Visit: Payer: Self-pay

## 2018-04-03 DIAGNOSIS — Z4803 Encounter for change or removal of drains: Secondary | ICD-10-CM | POA: Diagnosis not present

## 2018-04-03 DIAGNOSIS — T85638A Leakage of other specified internal prosthetic devices, implants and grafts, initial encounter: Secondary | ICD-10-CM | POA: Diagnosis not present

## 2018-04-03 DIAGNOSIS — C221 Intrahepatic bile duct carcinoma: Secondary | ICD-10-CM | POA: Insufficient documentation

## 2018-04-03 DIAGNOSIS — K831 Obstruction of bile duct: Secondary | ICD-10-CM | POA: Diagnosis not present

## 2018-04-03 HISTORY — PX: IR EXCHANGE BILIARY DRAIN: IMG6046

## 2018-04-03 MED ORDER — LIDOCAINE HCL 1 % IJ SOLN
INTRAMUSCULAR | Status: AC
  Filled 2018-04-03: qty 20

## 2018-04-03 MED ORDER — IOHEXOL 300 MG/ML  SOLN
50.0000 mL | Freq: Once | INTRAMUSCULAR | Status: AC | PRN
  Administered 2018-04-03: 20 mL

## 2018-04-03 NOTE — Procedures (Signed)
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  Right drain is leaking.  Procedure: Bilateral biliary drain exchanges  Findings: Left drain had been pulled back.  Distal aspect of right drain was probably occluded.  Two new 10 Fr drains placed (internal/extenal).  Both drain tips in duodenum.   Complications: None     EBL: None  Plan: Patient will cap both drains and flush daily.  Patient has bags in case there is a problem with drains such as leaking.     Duaa Stelzner R. Anselm Pancoast, MD  Pager: [phone]

## 2018-04-07 NOTE — Progress Notes (Signed)
Matthew Lewis   [phone] Fax:([phone]   Clinic Follow up Note   Patient Care Team: [name], MD as PCP - General (Internal Medicine)  Date of Service:  04/09/2018  CHIEF COMPLAINT: F/u on metastatic cholangiocarcinoma   SUMMARY OF ONCOLOGIC HISTORY: Oncology History   Cancer Staging Intrahepatic cholangiocarcinoma ([place]) Staging form: Intrahepatic Bile Duct, AJCC 8th Edition - Clinical stage from 10/29/2017: Stage IV (cT2, cN1, pM1) - Signed by [name], MD on 11/06/2017       Malignant neoplasm metastatic to adrenal gland [facility])    Initial Diagnosis    Malignant neoplasm metastatic to adrenal gland ([place])    10/23/2017 Imaging    ABD US IMPRESSION: Cirrhotic appearance of the liver.  2.6 cm questionable ill-defined mass versus area of focal parenchymal heterogeneity in segment 6 of the liver. If further imaging evaluation is desired, liver protocol abdominal MRI should be considered.  Splenomegaly.  Apparent thickening of the gallbladder wall, without evidence of cholelithiasis, and with negative sonographic Murphy's sign. The findings are favored to represent gallbladder wall edema secondary to systemic causes such as hyperproteinemia rather than acute cholecystitis. Please correlate clinically.     10/25/2017 Imaging    MRI IMPRESSION: 1. Advanced changes of cirrhosis. Interval development of multifocal enhancing liver lesions, which is concerning for multicentric hepatocellular carcinoma involving both lobes of liver. 2. Lesion within segment 4 B obstructs the right medial hepatic duct and left main duct resulting in moderate to marked biliary ductal dilatation within the left lobe and dome of liver. There may be partial obstruction of the right lateral duct with mild dilatation. 3. Tumor thrombus within the left branch of the portal vein likely secondary to segment 4 B tumor. 4. Bilateral adrenal metastasis and  retroperitoneal nodal metastasis. 5. Stigmata of portal venous hypertension including varices, ascites and splenomegaly. 6. These results will be called to the ordering clinician or representative by the Radiologist Assistant, and communication documented in the PACS or zVision Dashboard.    10/26/2017 Procedure    IMPRESSION: Status post formal PTC and internal/external biliary drainage.    10/27/2017 Imaging    IMPRESSION: Changes of cirrhosis. Multiple hepatic lesions are noted concerning for multifocal hepatoma or metastases. Biliary drain is in place with the tip in the distal common bile duct. There is mild intrahepatic biliary ductal dilatation. Bilateral adrenal nodules and masses, right larger than left compatible with metastases. Retroperitoneal adenopathy. Large stool burden with mild gaseous distention of the colon. Aortic atherosclerosis.     10/29/2017 Pathology Results    Diagnosis Soft Tissue Needle Core Biopsy, right adrenal mass - METASTATIC ADENOCARCINOMA, SEE COMMENT. Microscopic Comment There are malignant glands with central necrosis. Immunohistochemistry is positive for cytokeratin 20 and CDX-2. Cytokeratin 7, TTF-1 and NapsinA are negative. The morphology and immunoprofile favor a colorectal primary over pancreaticobiliary. [name] has reviewed the case. [name] was notified on 10/31/2017.    11/04/2017 Procedure    Colonoscopy impression - Seton in place found on digital rectal exam. - Congested mucosa in the rectum, in the sigmoid colon and in the descending colon. - A few diminutive polyps in the rectum and in the sigmoid colon. No colon or rectal mass identified. - No specimens collected.    02/19/2018 Imaging    CT CAP IMPRESSION: 1. Slight interval increase in size of left adrenal gland metastasis. Similar-appearing right adrenal gland metastasis. 2. While difficult to measure given ill-defined nature, infiltrative mass within the left  hepatic  lobe is grossly similar when compared to prior exam. Additional lesions within the liver are grossly similar. 3. Interval insertion of biliary drain within the left hepatic lobe with resultant decompression of the left hepatic lobe biliary system. 4. Similar-appearing sharply marginated consolidation within the right lower lobe which may represent infectious/inflammatory process or metastatic disease. 5. Large amount of stool throughout the colon as can be seen with constipation. 6. Persistent left hepatic lobe portal venous thrombosis. 7. Splenomegaly. 8. New moderate volume ascites.     Intrahepatic cholangiocarcinoma ([place])   10/27/2017 Imaging    10/27/2017 CT AP IMPRESSION: Changes of cirrhosis. Multiple hepatic lesions are noted concerning for multifocal hepatoma or metastases.  Biliary drain is in place with the tip in the distal common bile duct. There is mild intrahepatic biliary ductal dilatation.  Bilateral adrenal nodules and masses, right larger than left compatible with metastases.  Retroperitoneal adenopathy.  Large stool burden with mild gaseous distention of the colon.  Aortic atherosclerosis.    10/29/2017 Cancer Staging    Staging form: Intrahepatic Bile Duct, AJCC 8th Edition - Clinical stage from 10/29/2017: Stage IV (cT2, cN1, pM1) - Signed by [name], MD on 11/06/2017    10/29/2017 Pathology Results    10/29/2017 Surgical Pathology Diagnosis Soft Tissue Needle Core Biopsy, right adrenal mass - METASTATIC ADENOCARCINOMA, SEE COMMENT.    11/06/2017 Initial Diagnosis    Intrahepatic cholangiocarcinoma ([place])    11/06/2017 Imaging    11/06/2017 CT AP IMPRESSION: 1. Increase in RIGHT pleural effusion. Stable masslike consolidation at the RIGHT lung base representing neoplasm versus pneumonia. 2. Stable metastatic nodule at the RIGHT lung base. 3. Infiltrative mass in the central LEFT hepatic lobe and discrete lesions in the RIGHT  hepatic lobe consistent with metastatic disease versus primary hepatic biliary carcinoma. Favor cholangiocarcinoma. 4. Percutaneous drainage catheter extends the porta hepatis. Small trapped pneumothorax along the tract of the catheter at the inferolateral RIGHT lung base. 5. Stable adrenal metastasis. 6. Metastatic adenopathy in the retroperitoneum unchanged. 7. Morphologic changes in liver consistent with  cirrhosis.    11/08/2017 Imaging    11/08/2017 CT AP IMPRESSION: 1. Overall no significant interval change since the CT of 11/06/2017. Stable positioning of the percutaneous biliary drain with similar degree of biliary ductal dilatation. 2. Cirrhosis with multiple hepatic metastatic disease. Infiltrative appearing lesion in the left lobe of the liver appears to extend into the left portal vein and may represent a tumor thrombus, or bland thrombus. A primary biliary neoplasm (cholangiocarcinoma, or metastatic disease are other considerations. 3. Retroperitoneal, mesenteric, and adrenal metastatic disease. 4. Hepatic colopathy versus less likely segmental colitis of the hepatic flexure. 5. Small right pleural effusion and bilateral lung base pulmonary masses/nodules as seen previously. 6. Tiny pockets of pneumoperitoneum lateral to the liver along the percutaneous drain, decreased since the prior CT. 7. Other findings as above.    11/20/2017 - 11/24/2017 Hospital Admission    Admit date: 11/08/2017 Admission diagnosis: fatigue Additional comments: discharged 11/24/2017    12/18/2017 -  Chemotherapy    first line chemo gemcitabine/cisplatin on days 1 and 15 every 28 days starting 12/18/17    12/22/2017 - 12/30/2017 Hospital Admission    Admit date: 12/22/2017 Admission diagnosis: Dysuria    12/22/2017 Imaging    12/22/2017 CT AP IMPRESSION: 1. Prominently distended and otherwise normal urinary bladder with mild prostatomegaly. Findings suggest acute bladder outlet  obstruction. No hydronephrosis. 2. Large colorectal stool volume compatible with constipation. New mild circumferential rectal wall thickening and  perirectal fat haziness suggesting stercoral colitis. No free air. No abscess. 3. Stable infiltrative cholangiocarcinoma in the central left liver lobe with associated peripheral intrahepatic biliary ductal dilatation throughout the left liver lobe. 4. Stable well-positioned internal external drain traversing the right liver lobe and decompressing the right liver biliary system. 5. Stable liver, bilateral adrenal, retroperitoneal, left mesenteric and right infrahilar nodal and left lung base metastases. No progressive metastatic disease since 12/03/2017 CT. 6. Cirrhosis. Trace perihepatic ascites, decreased. Stable moderate splenomegaly. 7.  Aortic Atherosclerosis (ICD10-I70.0).    03/11/2018 -  Chemotherapy    The patient had palonosetron (ALOXI) injection 0.25 mg, 0.25 mg, Intravenous,  Once, 2 of 3 cycles Administration: 0.25 mg (03/26/2018), 0.25 mg (04/09/2018) pegfilgrastim (NEULASTA) injection 6 mg, 6 mg, Subcutaneous, Once, 2 of 3 cycles leucovorin 840 mg in sodium chloride 0.9 % 250 mL infusion, 400 mg/m2 = 840 mg, Intravenous,  Once, 3 of 4 cycles Administration: 840 mg (03/11/2018), 840 mg (03/26/2018) oxaliplatin (ELOXATIN) 150 mg in dextrose 5 % 500 mL chemo infusion, 71 mg/m2 = 145 mg (100 % of original dose 70 mg/m2), Intravenous,  Once, 2 of 3 cycles Dose modification: 70 mg/m2 (original dose 70 mg/m2, Cycle 2, Reason: Provider Judgment, Comment: thrombocytopenia ) Administration: 150 mg (03/26/2018) fluorouracil (ADRUCIL) 4,200 mg in sodium chloride 0.9 % 66 mL chemo infusion, 2,000 mg/m2 = 4,200 mg (100 % of original dose 2,000 mg/m2), Intravenous, 1 Day/Dose, 3 of 4 cycles Dose modification: 2,000 mg/m2 (original dose 2,000 mg/m2, Cycle 1, Reason: Provider Judgment) Administration: 4,200 mg (03/11/2018), 5,000 mg (03/26/2018)  for  chemotherapy treatment.      CURRENT THERAPY:  Second line FOLFOX every 2 weeks started 03/11/2018   INTERVAL HISTORY:  Matthew Lewis is here for a follow up of metastatic cholangiocarcinoma. He presents to the clinic today by himself. He notes he is doing moderately well. He notes he eats but smaller meals and about 1500 cal a day. He is taking ensure max protein.  He notes hie energy is down from feeling tired. He can do house work but will have to lay down. He notes he is able to tolerate chemo with fatigue. He notes with isolation and the fatigue he has been feeling he notes left side draining from his tube and getting 2L a day. He notes he drinks fluids but can drink more. He denies fever, cough, fever, abdominal pain or SOB. He notes his lungs have been hurting him.   REVIEW OF SYSTEMS:   Constitutional: Denies fevers, chills or abnormal weight loss (+) fatigue (+) Lower appetite, weight loss Eyes: Denies blurriness of vision Ears, nose, mouth, throat, and face: Denies mucositis or sore throat Respiratory: Denies cough, dyspnea or wheezes Cardiovascular: Denies palpitation, chest discomfort or lower extremity swelling Gastrointestinal:  Denies nausea, heartburn or change in bowel habits (+) Leaking from tubes  Skin: Denies abnormal skin rashes Lymphatics: Denies new lymphadenopathy or easy bruising Neurological:Denies numbness, tingling or new weaknesses Behavioral/Psych: Mood is stable, no new changes (+) Depressed  All other systems were reviewed with the patient and are negative.  MEDICAL HISTORY:  Past Medical History:  Diagnosis Date  . Abscess of anal and rectal regions    horse shoe abscess  . Alcoholic cirrhosis ([place])   . Arthritis    "everywhere; especially in my spine"  . Cirrhosis ([place])    STAGE 1 CIRRHOSIS PATIENT SEES DR NAMDIGAM FOR  . Colon cancer ([place]) 05/2005   TX SURGERY WITH LYMPH NODE REMOVAL  .  Cough    LAST FEW WEEKS SAW DR RAMOS LUNGS MILKY SPUTUM  OCC  . DM type 2 with diabetic peripheral neuropathy ([facility])    left foot  . Fatigue   . Fistula, anal    multiple  . History of substance abuse ([place])    last alcohol was 08/2011; last marijuania was 08/2011  . Hypertension   . PTSD (post-traumatic stress disorder)    "severe" HX OF  . Sleep deprivation     SURGICAL HISTORY: Past Surgical History:  Procedure Laterality Date  . ABSCESS DRAINAGE     "probably 15-20 so far at least; rectal"  . ANAL FISTULECTOMY  12/13/1998  . ANTERIOR FUSION CERVICAL SPINE  2010   "triple"; "did 2 surgeries on the same day in [id number] due to complications"  . CORONARY ANGIOPLASTY  2001  . detached muscle  2010   right chest; "after cervical fusion complications"  . ELBOW SURGERY  2007   "cut out part of a muscle"; left  . ESOPHAGEAL BANDING  02/25/2018   Procedure: ESOPHAGEAL BANDING;  Surgeon: Ladene Artist, MD;  Location: [facility] ENDOSCOPY;  Service: Endoscopy;;  . ESOPHAGOGASTRODUODENOSCOPY (EGD) WITH PROPOFOL N/A 02/25/2018   Procedure: ESOPHAGOGASTRODUODENOSCOPY (EGD) WITH PROPOFOL;  Surgeon: Ladene Artist, MD;  Location: [facility] ENDOSCOPY;  Service: Endoscopy;  Laterality: N/A;  . EXAMINATION UNDER ANESTHESIA  03/22/2005   fistula  . EXAMINATION UNDER ANESTHESIA  06/06/2011   Procedure: EXAM UNDER ANESTHESIA;  Surgeon: Stark Klein, MD;  Location: [place];  Service: General;  Laterality: N/A;  . HEMICOLECTOMY  06/06/2005   left  . INCISION AND DRAINAGE PERIRECTAL ABSCESS  03/30/2005  . INCISION AND DRAINAGE PERIRECTAL ABSCESS  12/13/2004  . INCISION AND DRAINAGE PERIRECTAL ABSCESS  07/14/2001  . INCISION AND DRAINAGE PERIRECTAL ABSCESS  08/12/2010   horseshoe abscess; Dr Redmond Pulling  . INCISION AND DRAINAGE PERIRECTAL ABSCESS  06/06/2011   Procedure: IRRIGATION AND DEBRIDEMENT PERIRECTAL ABSCESS;  Surgeon: Stark Klein, MD;  Location: [place];  Service: General;  Laterality: N/A;  . INCISION AND DRAINAGE PERIRECTAL ABSCESS N/A 02/24/2017   Procedure: IRRIGATION AND  DEBRIDEMENT PERIRECTAL ABSCESS;  Surgeon: Ralene Ok, MD;  Location: [place];  Service: General;  Laterality: N/A;  . IR CATHETER TUBE CHANGE  02/19/2018  . IR CHOLANGIOGRAM EXISTING TUBE  12/23/2017  . IR CHOLANGIOGRAM EXISTING TUBE  01/02/2018  . IR EXCHANGE BILIARY DRAIN  11/07/2017  . IR EXCHANGE BILIARY DRAIN  12/09/2017  . IR EXCHANGE BILIARY DRAIN  02/03/2018  . IR EXCHANGE BILIARY DRAIN  02/03/2018  . IR EXCHANGE BILIARY DRAIN  04/03/2018  . IR EXCHANGE BILIARY DRAIN  04/03/2018  . IR IMAGING GUIDED PORT INSERTION  12/09/2017  . IR INT EXT BILIARY DRAIN WITH CHOLANGIOGRAM  10/26/2017  . IR INT EXT BILIARY DRAIN WITH CHOLANGIOGRAM  12/23/2017  . IR PARACENTESIS  02/25/2018  . IR PATIENT EVAL TECH 0-60 MINS  12/02/2017  . IR RADIOLOGIST EVAL & MGMT  11/06/2017  . IR RADIOLOGIST EVAL & MGMT  01/05/2018  . [place]  . PERCUTANEOUS PINNING PHALANX FRACTURE OF HAND  ~ 2008   "plates in 2 places"; right  . PLACEMENT OF SETON N/A 09/05/2017   Procedure: PLACEMENT OF SETON;  Surgeon: Leighton Ruff, MD;  Location: [facility];  Service: General;  Laterality: N/A;  . RADIOLOGY WITH ANESTHESIA N/A 10/25/2017   Procedure: MRI WITH ANESTHESIA;  Surgeon: Radiologist, Medication, MD;  Location: [place];  Service: Radiology;  Laterality: N/A;  .  THORACIC DISCECTOMY  1990's    I have reviewed the social history and family history with the patient and they are unchanged from previous note.  ALLERGIES:  is allergic to morphine and related.  MEDICATIONS:  Current Outpatient Medications  Medication Sig Dispense Refill  . aspirin EC 81 MG tablet Take 1 tablet (81 mg total) by mouth daily.    . Ca Carbonate-Mag Hydroxide (ROLAIDS PO) Take 1-2 tablets by mouth 2 (two) times daily as needed (heartburn).    . fexofenadine (ALLEGRA) 180 MG tablet Take 180 mg by mouth at bedtime.    . gabapentin (NEURONTIN) 400 MG capsule Take 400-800 mg by mouth See admin instructions. Take  400 mg by mouth in the morning and 800 mg in the evening    . insulin detemir (LEVEMIR) 100 UNIT/ML injection Inject 10-62 Units into the skin See admin instructions. Take 62 in the am and 10 in the pm    . lactose free nutrition (BOOST PLUS) LIQD Take 237 mLs by mouth 3 (three) times daily with meals. (Patient taking differently: Take 237 mLs by mouth 3 (three) times daily with meals. Ensure chocolate and vanilla max protein) 30 Can 0  . lidocaine-prilocaine (EMLA) cream Apply to port site 1 hour prior to access (Patient taking differently: Apply 1 application topically once. Apply to port site 1 hour prior to access) 30 g 0  . Multiple Vitamin (MULTIVITAMIN) tablet Take 1 tablet by mouth daily.    . pantoprazole (PROTONIX) 40 MG tablet Take 1 tablet (40 mg total) by mouth 2 (two) times daily. 60 tablet 0  . Sodium Chloride Flush (NORMAL SALINE FLUSH) 0.9 % SOLN Inject 10 mLs into the vein daily. 30 Syringe 2  . tamsulosin (FLOMAX) 0.4 MG CAPS capsule TAKE 1 CAPSULE(0.4 MG) BY MOUTH DAILY AFTER SUPPER 90 capsule 0  . propranolol ER (INDERAL LA) 60 MG 24 hr capsule Take 1 capsule (60 mg total) by mouth daily for 30 days. 30 capsule 0   No current facility-administered medications for this visit.    Facility-Administered Medications Ordered in Other Visits  Medication Dose Route Frequency Provider Last Rate Last Dose  . fluorouracil (ADRUCIL) 5,000 mg in sodium chloride 0.9 % 150 mL chemo infusion  2,375 mg/m2 (Treatment Plan Recorded) Intravenous 1 day or 1 dose [name], MD      . heparin lock flush 100 unit/mL  500 Units Intracatheter Once PRN [name], MD      . leucovorin 840 mg in dextrose 5 % 250 mL infusion  400 mg/m2 (Treatment Plan Recorded) Intravenous Once [name], MD      . oxaliplatin (ELOXATIN) 150 mg in dextrose 5 % 500 mL chemo infusion  71 mg/m2 (Treatment Plan Recorded) Intravenous Once [name], MD      . sodium chloride flush (NS) 0.9 % injection 10 mL  10 mL Intracatheter  PRN [name], MD        PHYSICAL EXAMINATION: ECOG PERFORMANCE STATUS: 2 - Symptomatic, <50% confined to bed  Vitals:   04/09/18 0851  BP: 119/82  Pulse: (!) 113  Resp: 20  Temp: 97.7 F (36.5 C)  SpO2: 100%   Filed Weights   04/09/18 0851  Weight: 184 lb 6.4 oz (83.6 kg)    GENERAL:alert, no distress and comfortable SKIN: skin color, texture, turgor are normal, no rashes or significant lesions EYES: normal, Conjunctiva are pink and non-injected, sclera clear OROPHARYNX:no exudate, no erythema and lips, buccal mucosa, and tongue normal  NECK: supple, thyroid normal size, non-tender, without nodularity LYMPH:  no palpable lymphadenopathy in the cervical, axillary or inguinal LUNGS: clear to auscultation and percussion with normal breathing effort HEART: regular rate & rhythm and no murmurs and no lower extremity edema ABDOMEN:abdomen soft, non-tender and normal bowel sounds (+) Midline and right biliary draining tubes and bag, clean Musculoskeletal:no cyanosis of digits and no clubbing  NEURO: alert & oriented x 3 with fluent speech, no focal motor/sensory deficits  LABORATORY DATA:  I have reviewed the data as listed CBC Latest Ref Rng & Units 04/09/2018 03/26/2018 03/11/2018  WBC 4.0 - 10.5 K/uL 8.5 7.3 13.6(H)  Hemoglobin 13.0 - 17.0 g/dL 10.5(L) 10.3(L) 9.5(L)  Hematocrit 39.0 - 52.0 % 32.8(L) 32.4(L) 29.2(L)  Platelets 150 - 400 K/uL 172 126(L) 160     CMP Latest Ref Rng & Units 04/09/2018 03/26/2018 03/11/2018  Glucose 70 - 99 mg/dL 204(H) 265(H) 173(H)  BUN 8 - 23 mg/dL _0 Creatinine 0.61 - 1.24 mg/dL 0.80 0.86 0.75  Sodium 135 - 145 mmol/L 133(L) 133(L) 128(L)  Potassium 3.5 - 5.1 mmol/L 3.9 4.2 4.0  Chloride 98 - 111 mmol/L 102 103 99  CO2 22 - 32 mmol/L _1 Calcium 8.9 - 10.3 mg/dL 9.3 9.1 8.6(L)  Total Protein 6.5 - 8.1 g/dL 8.5(H) 8.5(H) 7.8  Total Bilirubin 0.3 - 1.2 mg/dL 1.1 1.2 1.7(H)  Alkaline Phos 38 - 126 U/L 346(H) 405(H) 640(H)  AST 15 -  41 U/L 31 37 55(H)  ALT 0 - 44 U/L 24 31 57(H)      RADIOGRAPHIC STUDIES: I have personally reviewed the radiological images as listed and agreed with the findings in the report. No results found.   ASSESSMENT & PLAN:  Matthew Lewis is a 63 y.o. male with   1. Metastaticintrahepatic cholangiocarcinomato right adrenal gland, liver, lungs and nodes -Diagnosed in 10/2017. He had 2 biliary drainage placements. His cancer treatment was postponed due to multiple hospitalization for infection, dehydration, it etc. Hewasonfirst lineCisplatin and gemcitabinestopped on 02/20/2018 because his restaging CT scan showed enlarged adrenal glad metastasis consistent disease progression. -He has had multiple hospitalizations since his diagnosis of cancer for infection, dehydration and GI bleeding  -He started second line treatment FOLFOX on 03/11/2018 and was only given 5-FU and leucovorin on for first cycle due to recent GI bleeding and will continue treatment every 2 weeks. And then Oxaliplatin was added with cycle 2.  -He is clinically doing moderately well and is tolerating chemotherapy well. He denies any  problems due to chemotherapy except fatigue for 3-4 days after treatment.  -Labs reviewed, CBC WNL except hg  10.5, CMP WNL except BG 204. Ca 19.9 still pending. Overall adequate to proceed with FOLFOX chemotherapy treatment today.  -F/u in 2 weeks    2.Obstructive jaundice, status post bilateral percutaneous biliary drain -drained tube exchange1/28, both were capped. Due to bilary leakage from left side this morning, the left drain tube is connected to a bag now,it is draining well, and his output has decreased. -His right drain tube is capped and his left drain tube. His right tube has been draining more lately. I encouraged him to drink plenty of fluids.  -f/u with IR -continue monitoring LFTs   3.Liver cirrhosis secondary to alcohol and NASH -Stable  4. Portal vein  thrombosis -He is on aspirin, no anticoagulation due to initial bloody biliary drainage  5. DM, HTN -f/u with PCP  6.H/o substance abuse and addiction -  He's been clean for 6 years now -will becautious about narcoticsandbenzo.  7. Anemia  -Secondary to chemotherapy, recent GI bleedingand underlying malignancy -He received 5 unit blood transfusion during his hospitalization for GI bleeding, anemia improved.  -Hg at 10.5 today, no need for blood transfusion. (04/09/18)  8. GI bleeding fromduodenal ulcers -Patient developed upper GI bleeding on February 24, 2018, required hospitalization, and multiple blood transfusion. -Bleeding has stopped and is possibly related to NSAIDs -Continue Protonix twice daily indefinitelyper GI Dr. Fuller Plan   9.Goal of care discussion, DNR/DNI -Wepreviouslydiscussed code status in length.I recommend DNR/DNI,he agrees. -He understands his cancer is not curable, and the goal of therapy is to prolong his life and improve his quality of life.  10. Lower appetite, weight loss  -His weight continues to trend down   -I encouraged him to increase calorie and protein in take to about 2000 cal a day  -He will continue Ensure daily   Plan -I reviewed labs today, adequate to continue cycle 3 FOLFOX chemotherapy today  -Lab, flush, f/u and treatment in 2 weeks     No problem-specific Assessment & Plan notes found for this encounter.   No orders of the defined types were placed in this encounter.  All questions were answered. The patient knows to call the clinic with any problems, questions or concerns. No barriers to learning was detected. I spent 20 minutes counseling the patient face to face. The total time spent in the appointment was 25 minutes and more than 50% was on counseling and review of test results     [name], MD 04/09/2018   I, Joslyn Devon, am acting as scribe for [name], MD.   I have reviewed the above documentation  for accuracy and completeness, and I agree with the above.

## 2018-04-09 ENCOUNTER — Telehealth: Payer: Self-pay | Admitting: Hematology

## 2018-04-09 ENCOUNTER — Inpatient Hospital Stay (HOSPITAL_BASED_OUTPATIENT_CLINIC_OR_DEPARTMENT_OTHER): Payer: Medicare Other | Admitting: Hematology

## 2018-04-09 ENCOUNTER — Inpatient Hospital Stay: Payer: Medicare Other | Attending: Hematology

## 2018-04-09 ENCOUNTER — Inpatient Hospital Stay: Payer: Medicare Other

## 2018-04-09 ENCOUNTER — Other Ambulatory Visit: Payer: Self-pay

## 2018-04-09 ENCOUNTER — Encounter: Payer: Self-pay | Admitting: Hematology

## 2018-04-09 VITALS — HR 110

## 2018-04-09 VITALS — BP 119/82 | HR 113 | Temp 97.7°F | Resp 20 | Ht 72.0 in | Wt 184.4 lb

## 2018-04-09 DIAGNOSIS — E119 Type 2 diabetes mellitus without complications: Secondary | ICD-10-CM | POA: Insufficient documentation

## 2018-04-09 DIAGNOSIS — C772 Secondary and unspecified malignant neoplasm of intra-abdominal lymph nodes: Secondary | ICD-10-CM

## 2018-04-09 DIAGNOSIS — C7801 Secondary malignant neoplasm of right lung: Secondary | ICD-10-CM

## 2018-04-09 DIAGNOSIS — C7972 Secondary malignant neoplasm of left adrenal gland: Secondary | ICD-10-CM | POA: Insufficient documentation

## 2018-04-09 DIAGNOSIS — R634 Abnormal weight loss: Secondary | ICD-10-CM | POA: Insufficient documentation

## 2018-04-09 DIAGNOSIS — C7802 Secondary malignant neoplasm of left lung: Secondary | ICD-10-CM

## 2018-04-09 DIAGNOSIS — C221 Intrahepatic bile duct carcinoma: Secondary | ICD-10-CM | POA: Insufficient documentation

## 2018-04-09 DIAGNOSIS — K7030 Alcoholic cirrhosis of liver without ascites: Secondary | ICD-10-CM

## 2018-04-09 DIAGNOSIS — R42 Dizziness and giddiness: Secondary | ICD-10-CM | POA: Diagnosis not present

## 2018-04-09 DIAGNOSIS — I10 Essential (primary) hypertension: Secondary | ICD-10-CM | POA: Insufficient documentation

## 2018-04-09 DIAGNOSIS — D6481 Anemia due to antineoplastic chemotherapy: Secondary | ICD-10-CM | POA: Insufficient documentation

## 2018-04-09 DIAGNOSIS — C7971 Secondary malignant neoplasm of right adrenal gland: Secondary | ICD-10-CM | POA: Diagnosis not present

## 2018-04-09 DIAGNOSIS — Z7189 Other specified counseling: Secondary | ICD-10-CM

## 2018-04-09 DIAGNOSIS — I81 Portal vein thrombosis: Secondary | ICD-10-CM

## 2018-04-09 DIAGNOSIS — Z79899 Other long term (current) drug therapy: Secondary | ICD-10-CM | POA: Diagnosis not present

## 2018-04-09 DIAGNOSIS — I959 Hypotension, unspecified: Secondary | ICD-10-CM | POA: Insufficient documentation

## 2018-04-09 DIAGNOSIS — Z5111 Encounter for antineoplastic chemotherapy: Secondary | ICD-10-CM | POA: Diagnosis not present

## 2018-04-09 DIAGNOSIS — Z794 Long term (current) use of insulin: Secondary | ICD-10-CM | POA: Insufficient documentation

## 2018-04-09 DIAGNOSIS — Z95828 Presence of other vascular implants and grafts: Secondary | ICD-10-CM

## 2018-04-09 LAB — CMP (CANCER CENTER ONLY)
ALT: 24 U/L (ref 0–44)
AST: 31 U/L (ref 15–41)
Albumin: 2.9 g/dL — ABNORMAL LOW (ref 3.5–5.0)
Alkaline Phosphatase: 346 U/L — ABNORMAL HIGH (ref 38–126)
Anion gap: 9 (ref 5–15)
BUN: 14 mg/dL (ref 8–23)
CO2: 22 mmol/L (ref 22–32)
Calcium: 9.3 mg/dL (ref 8.9–10.3)
Chloride: 102 mmol/L (ref 98–111)
Creatinine: 0.8 mg/dL (ref 0.61–1.24)
GFR, Est AFR Am: >60 mL/min (ref >60)
GFR, Estimated: >60 mL/min (ref >60)
Glucose, Bld: 204 mg/dL — ABNORMAL HIGH (ref 70–99)
Potassium: 3.9 mmol/L (ref 3.5–5.1)
Sodium: 133 mmol/L — ABNORMAL LOW (ref 135–145)
Total Bilirubin: 1.1 mg/dL (ref 0.3–1.2)
Total Protein: 8.5 g/dL — ABNORMAL HIGH (ref 6.5–8.1)

## 2018-04-09 LAB — CBC WITH DIFFERENTIAL (CANCER CENTER ONLY)
Abs Immature Granulocytes: 0.04 10*3/uL (ref 0.00–0.07)
Basophils Absolute: 0 10*3/uL (ref 0.0–0.1)
Basophils Relative: 0 %
Eosinophils Absolute: 0.3 10*3/uL (ref 0.0–0.5)
Eosinophils Relative: 4 %
HCT: 32.8 % — ABNORMAL LOW (ref 39.0–52.0)
Hemoglobin: 10.5 g/dL — ABNORMAL LOW (ref 13.0–17.0)
Immature Granulocytes: 1 %
Lymphocytes Relative: 13 %
Lymphs Abs: 1.1 10*3/uL (ref 0.7–4.0)
MCH: 30.9 pg (ref 26.0–34.0)
MCHC: 32 g/dL (ref 30.0–36.0)
MCV: 96.5 fL (ref 80.0–100.0)
Monocytes Absolute: 0.7 10*3/uL (ref 0.1–1.0)
Monocytes Relative: 8 %
Neutro Abs: 6.3 10*3/uL (ref 1.7–7.7)
Neutrophils Relative %: 74 %
Platelet Count: 172 10*3/uL (ref 150–400)
RBC: 3.4 MIL/uL — ABNORMAL LOW (ref 4.22–5.81)
RDW: 16.5 % — ABNORMAL HIGH (ref 11.5–15.5)
WBC Count: 8.5 10*3/uL (ref 4.0–10.5)
nRBC: 0 % (ref 0.0–0.2)

## 2018-04-09 MED ORDER — PALONOSETRON HCL INJECTION 0.25 MG/5ML
0.2500 mg | Freq: Once | INTRAVENOUS | Status: AC
  Administered 2018-04-09: 0.25 mg via INTRAVENOUS

## 2018-04-09 MED ORDER — PALONOSETRON HCL INJECTION 0.25 MG/5ML
INTRAVENOUS | Status: AC
  Filled 2018-04-09: qty 5

## 2018-04-09 MED ORDER — DEXAMETHASONE SODIUM PHOSPHATE 10 MG/ML IJ SOLN
INTRAMUSCULAR | Status: AC
  Filled 2018-04-09: qty 1

## 2018-04-09 MED ORDER — DEXTROSE 5 % IV SOLN
Freq: Once | INTRAVENOUS | Status: AC
  Administered 2018-04-09: 10:00:00 via INTRAVENOUS
  Filled 2018-04-09: qty 250

## 2018-04-09 MED ORDER — OXALIPLATIN CHEMO INJECTION 100 MG/20ML
71.0000 mg/m2 | Freq: Once | INTRAVENOUS | Status: AC
  Administered 2018-04-09: 150 mg via INTRAVENOUS
  Filled 2018-04-09: qty 20

## 2018-04-09 MED ORDER — DEXAMETHASONE SODIUM PHOSPHATE 10 MG/ML IJ SOLN
10.0000 mg | Freq: Once | INTRAMUSCULAR | Status: AC
  Administered 2018-04-09: 10 mg via INTRAVENOUS

## 2018-04-09 MED ORDER — LEUCOVORIN CALCIUM INJECTION 350 MG
400.0000 mg/m2 | Freq: Once | INTRAVENOUS | Status: AC
  Administered 2018-04-09: 11:00:00 840 mg via INTRAVENOUS
  Filled 2018-04-09: qty 42

## 2018-04-09 MED ORDER — SODIUM CHLORIDE 0.9 % IV SOLN
2375.0000 mg/m2 | INTRAVENOUS | Status: DC
  Administered 2018-04-09: 13:00:00 5000 mg via INTRAVENOUS
  Filled 2018-04-09: qty 100

## 2018-04-09 MED ORDER — SODIUM CHLORIDE 0.9% FLUSH
10.0000 mL | INTRAVENOUS | Status: DC | PRN
  Filled 2018-04-09: qty 10

## 2018-04-09 MED ORDER — HEPARIN SOD (PORK) LOCK FLUSH 100 UNIT/ML IV SOLN
500.0000 [IU] | Freq: Once | INTRAVENOUS | Status: DC | PRN
  Filled 2018-04-09: qty 5

## 2018-04-09 MED ORDER — SODIUM CHLORIDE 0.9% FLUSH
10.0000 mL | INTRAVENOUS | Status: DC | PRN
  Administered 2018-04-09: 09:00:00 10 mL
  Filled 2018-04-09: qty 10

## 2018-04-09 NOTE — Telephone Encounter (Signed)
No los per 4/2

## 2018-04-09 NOTE — Progress Notes (Signed)
Received okay to receive treatment today from [name] for pulse of 110.

## 2018-04-09 NOTE — Patient Instructions (Signed)
[facility] Discharge Instructions for Patients Receiving Chemotherapy  Today you received the following chemotherapy agents Oxaliplatin (ELOXATIN), Leucovorin & Flourouracil (ADRUCIL).  To help prevent nausea and vomiting after your treatment, we encourage you to take your nausea medication as prescribed.   If you develop nausea and vomiting that is not controlled by your nausea medication, call the clinic.   BELOW ARE SYMPTOMS THAT SHOULD BE REPORTED IMMEDIATELY:  *FEVER GREATER THAN 100.5 F  *CHILLS WITH OR WITHOUT FEVER  NAUSEA AND VOMITING THAT IS NOT CONTROLLED WITH YOUR NAUSEA MEDICATION  *UNUSUAL SHORTNESS OF BREATH  *UNUSUAL BRUISING OR BLEEDING  TENDERNESS IN MOUTH AND THROAT WITH OR WITHOUT PRESENCE OF ULCERS  *URINARY PROBLEMS  *BOWEL PROBLEMS  UNUSUAL RASH Items with * indicate a potential emergency and should be followed up as soon as possible.  Feel free to call the clinic should you have any questions or concerns. The clinic phone number is [phone].  Please show the [facility] at check-in to the Emergency Department and triage nurse.  Oxaliplatin Injection What is this medicine? OXALIPLATIN (ox AL i PLA tin) is a chemotherapy drug. It targets fast dividing cells, like cancer cells, and causes these cells to die. This medicine is used to treat cancers of the colon and rectum, and many other cancers. This medicine may be used for other purposes; ask your health care provider or pharmacist if you have questions. COMMON BRAND NAME(S): Eloxatin What should I tell my health care provider before I take this medicine? They need to know if you have any of these conditions: -kidney disease -an unusual or allergic reaction to oxaliplatin, other chemotherapy, other medicines, foods, dyes, or preservatives -pregnant or trying to get pregnant -breast-feeding How should I use this medicine? This drug is given as an infusion into a vein. It is  administered in a hospital or clinic by a specially trained health care professional. Talk to your pediatrician regarding the use of this medicine in children. Special care may be needed. Overdosage: If you think you have taken too much of this medicine contact a poison control center or emergency room at once. NOTE: This medicine is only for you. Do not share this medicine with others. What if I miss a dose? It is important not to miss a dose. Call your doctor or health care professional if you are unable to keep an appointment. What may interact with this medicine? -medicines to increase blood counts like filgrastim, pegfilgrastim, sargramostim -probenecid -some antibiotics like amikacin, gentamicin, neomycin, polymyxin B, streptomycin, tobramycin -zalcitabine Talk to your doctor or health care professional before taking any of these medicines: -acetaminophen -aspirin -ibuprofen -ketoprofen -naproxen This list may not describe all possible interactions. Give your health care provider a list of all the medicines, herbs, non-prescription drugs, or dietary supplements you use. Also tell them if you smoke, drink alcohol, or use illegal drugs. Some items may interact with your medicine. What should I watch for while using this medicine? Your condition will be monitored carefully while you are receiving this medicine. You will need important blood work done while you are taking this medicine. This medicine can make you more sensitive to cold. Do not drink cold drinks or use ice. Cover exposed skin before coming in contact with cold temperatures or cold objects. When out in cold weather wear warm clothing and cover your mouth and nose to warm the air that goes into your lungs. Tell your doctor if you get sensitive to  the cold. This drug may make you feel generally unwell. This is not uncommon, as chemotherapy can affect healthy cells as well as cancer cells. Report any side effects. Continue your  course of treatment even though you feel ill unless your doctor tells you to stop. In some cases, you may be given additional medicines to help with side effects. Follow all directions for their use. Call your doctor or health care professional for advice if you get a fever, chills or sore throat, or other symptoms of a cold or flu. Do not treat yourself. This drug decreases your body's ability to fight infections. Try to avoid being around people who are sick. This medicine may increase your risk to bruise or bleed. Call your doctor or health care professional if you notice any unusual bleeding. Be careful brushing and flossing your teeth or using a toothpick because you may get an infection or bleed more easily. If you have any dental work done, tell your dentist you are receiving this medicine. Avoid taking products that contain aspirin, acetaminophen, ibuprofen, naproxen, or ketoprofen unless instructed by your doctor. These medicines may hide a fever. Do not become pregnant while taking this medicine. Women should inform their doctor if they wish to become pregnant or think they might be pregnant. There is a potential for serious side effects to an unborn child. Talk to your health care professional or pharmacist for more information. Do not breast-feed an infant while taking this medicine. Call your doctor or health care professional if you get diarrhea. Do not treat yourself. What side effects may I notice from receiving this medicine? Side effects that you should report to your doctor or health care professional as soon as possible: -allergic reactions like skin rash, itching or hives, swelling of the face, lips, or tongue -low blood counts - This drug may decrease the number of white blood cells, red blood cells and platelets. You may be at increased risk for infections and bleeding. -signs of infection - fever or chills, cough, sore throat, pain or difficulty passing urine -signs of decreased  platelets or bleeding - bruising, pinpoint red spots on the skin, black, tarry stools, nosebleeds -signs of decreased red blood cells - unusually weak or tired, fainting spells, lightheadedness -breathing problems -chest pain, pressure -cough -diarrhea -jaw tightness -mouth sores -nausea and vomiting -pain, swelling, redness or irritation at the injection site -pain, tingling, numbness in the hands or feet -problems with balance, talking, walking -redness, blistering, peeling or loosening of the skin, including inside the mouth -trouble passing urine or change in the amount of urine Side effects that usually do not require medical attention (report to your doctor or health care professional if they continue or are bothersome): -changes in vision -constipation -hair loss -loss of appetite -metallic taste in the mouth or changes in taste -stomach pain This list may not describe all possible side effects. Call your doctor for medical advice about side effects. You may report side effects to FDA at 1-800-FDA-1088. Where should I keep my medicine? This drug is given in a hospital or clinic and will not be stored at home. NOTE: This sheet is a summary. It may not cover all possible information. If you have questions about this medicine, talk to your doctor, pharmacist, or health care provider.  2019 [employer]vier/Gold Standard (2007-07-21 17:22:47)  Coronavirus (COVID-19) Are you at risk?  Are you at risk for the Coronavirus (COVID-19)?  To be considered HIGH RISK for Coronavirus (COVID-19), you have to  meet the following criteria:  . Traveled to [place], [place], [place], [place] or [place]; or in the [place] to [place], [place], [place], or [place]; and have fever, cough, and shortness of breath within the last 2 weeks of travel OR . Been in close contact with a person diagnosed with COVID-19 within the last 2 weeks and have fever, cough, and shortness of breath . IF YOU DO NOT  MEET THESE CRITERIA, YOU ARE CONSIDERED LOW RISK FOR COVID-19.  What to do if you are HIGH RISK for COVID-19?  Marland Kitchen If you are having a medical emergency, call 911. . Seek medical care right away. Before you go to a doctor's office, urgent care or emergency department, call ahead and tell them about your recent travel, contact with someone diagnosed with COVID-19, and your symptoms. You should receive instructions from your physician's office regarding next steps of care.  . When you arrive at healthcare provider, tell the healthcare staff immediately you have returned from visiting [place], [place], [place], [place] or [place]; or traveled in the [place] to [place], [place], [place], or [place]; in the last two weeks or you have been in close contact with a person diagnosed with COVID-19 in the last 2 weeks.   . Tell the health care staff about your symptoms: fever, cough and shortness of breath. . After you have been seen by a medical provider, you will be either: o Tested for (COVID-19) and discharged home on quarantine except to seek medical care if symptoms worsen, and asked to  - Stay home and avoid contact with others until you get your results (4-5 days)  - Avoid travel on public transportation if possible (such as bus, train, or airplane) or o Sent to the Emergency Department by EMS for evaluation, COVID-19 testing, and possible admission depending on your condition and test results.  What to do if you are LOW RISK for COVID-19?  Reduce your risk of any infection by using the same precautions used for avoiding the common cold or flu:  Marland Kitchen Wash your hands often with soap and warm water for at least 20 seconds.  If soap and water are not readily available, use an alcohol-based hand sanitizer with at least 60% alcohol.  . If coughing or sneezing, cover your mouth and nose by coughing or sneezing into the elbow areas of your shirt or coat, into a tissue or into your sleeve (not your  hands). . Avoid shaking hands with others and consider head nods or verbal greetings only. . Avoid touching your eyes, nose, or mouth with unwashed hands.  . Avoid close contact with people who are sick. . Avoid places or events with large numbers of people in one location, like concerts or sporting events. . Carefully consider travel plans you have or are making. . If you are planning any travel outside or inside the [place], visit the CDC's Travelers' Health webpage for the latest health notices. . If you have some symptoms but not all symptoms, continue to monitor at home and seek medical attention if your symptoms worsen. . If you are having a medical emergency, call 911.   [facility] / e-Visit: [email or web address]         [facility]: [facility]: [phone]                   [facility]: [phone]

## 2018-04-10 LAB — CANCER ANTIGEN 19-9: CA 19-9: 30 U/mL (ref 0–35)

## 2018-04-11 ENCOUNTER — Other Ambulatory Visit: Payer: Self-pay

## 2018-04-11 ENCOUNTER — Inpatient Hospital Stay: Payer: Medicare Other

## 2018-04-11 VITALS — BP 125/80 | HR 111 | Temp 97.8°F | Resp 19

## 2018-04-11 DIAGNOSIS — C7801 Secondary malignant neoplasm of right lung: Secondary | ICD-10-CM | POA: Diagnosis not present

## 2018-04-11 DIAGNOSIS — D6481 Anemia due to antineoplastic chemotherapy: Secondary | ICD-10-CM | POA: Diagnosis not present

## 2018-04-11 DIAGNOSIS — C7802 Secondary malignant neoplasm of left lung: Secondary | ICD-10-CM | POA: Diagnosis not present

## 2018-04-11 DIAGNOSIS — C7972 Secondary malignant neoplasm of left adrenal gland: Secondary | ICD-10-CM | POA: Diagnosis not present

## 2018-04-11 DIAGNOSIS — C221 Intrahepatic bile duct carcinoma: Secondary | ICD-10-CM | POA: Diagnosis not present

## 2018-04-11 DIAGNOSIS — C772 Secondary and unspecified malignant neoplasm of intra-abdominal lymph nodes: Secondary | ICD-10-CM | POA: Diagnosis not present

## 2018-04-11 DIAGNOSIS — I10 Essential (primary) hypertension: Secondary | ICD-10-CM | POA: Diagnosis not present

## 2018-04-11 DIAGNOSIS — C7971 Secondary malignant neoplasm of right adrenal gland: Secondary | ICD-10-CM | POA: Diagnosis not present

## 2018-04-11 DIAGNOSIS — Z7189 Other specified counseling: Secondary | ICD-10-CM

## 2018-04-11 DIAGNOSIS — I81 Portal vein thrombosis: Secondary | ICD-10-CM | POA: Diagnosis not present

## 2018-04-11 DIAGNOSIS — Z79899 Other long term (current) drug therapy: Secondary | ICD-10-CM | POA: Diagnosis not present

## 2018-04-11 DIAGNOSIS — R42 Dizziness and giddiness: Secondary | ICD-10-CM | POA: Diagnosis not present

## 2018-04-11 DIAGNOSIS — I959 Hypotension, unspecified: Secondary | ICD-10-CM | POA: Diagnosis not present

## 2018-04-11 DIAGNOSIS — Z5111 Encounter for antineoplastic chemotherapy: Secondary | ICD-10-CM | POA: Diagnosis not present

## 2018-04-11 DIAGNOSIS — Z794 Long term (current) use of insulin: Secondary | ICD-10-CM | POA: Diagnosis not present

## 2018-04-11 DIAGNOSIS — E119 Type 2 diabetes mellitus without complications: Secondary | ICD-10-CM | POA: Diagnosis not present

## 2018-04-11 MED ORDER — PEGFILGRASTIM INJECTION 6 MG/0.6ML ~~LOC~~
PREFILLED_SYRINGE | SUBCUTANEOUS | Status: AC
  Filled 2018-04-11: qty 0.6

## 2018-04-11 MED ORDER — HEPARIN SOD (PORK) LOCK FLUSH 100 UNIT/ML IV SOLN
500.0000 [IU] | Freq: Once | INTRAVENOUS | Status: AC | PRN
  Administered 2018-04-11: 500 [IU]
  Filled 2018-04-11: qty 5

## 2018-04-11 MED ORDER — PEGFILGRASTIM INJECTION 6 MG/0.6ML ~~LOC~~
6.0000 mg | PREFILLED_SYRINGE | Freq: Once | SUBCUTANEOUS | Status: AC
  Administered 2018-04-11: 6 mg via SUBCUTANEOUS

## 2018-04-11 MED ORDER — SODIUM CHLORIDE 0.9% FLUSH
10.0000 mL | INTRAVENOUS | Status: DC | PRN
  Administered 2018-04-11: 10 mL
  Filled 2018-04-11: qty 10

## 2018-04-14 DIAGNOSIS — J301 Allergic rhinitis due to pollen: Secondary | ICD-10-CM | POA: Diagnosis not present

## 2018-04-14 DIAGNOSIS — L298 Other pruritus: Secondary | ICD-10-CM | POA: Diagnosis not present

## 2018-04-15 ENCOUNTER — Other Ambulatory Visit (HOSPITAL_COMMUNITY): Payer: Self-pay | Admitting: Radiology

## 2018-04-15 ENCOUNTER — Ambulatory Visit (HOSPITAL_COMMUNITY)
Admission: RE | Admit: 2018-04-15 | Discharge: 2018-04-15 | Disposition: A | Discharge status: Home / Self Care | Payer: Medicare Other | Source: Ambulatory Visit | Attending: Radiology | Admitting: Radiology

## 2018-04-15 ENCOUNTER — Encounter (HOSPITAL_COMMUNITY): Payer: Self-pay | Admitting: Radiology

## 2018-04-15 ENCOUNTER — Other Ambulatory Visit: Payer: Self-pay

## 2018-04-15 DIAGNOSIS — C221 Intrahepatic bile duct carcinoma: Secondary | ICD-10-CM

## 2018-04-15 HISTORY — PX: IR FLUORO RM 30-60 MIN: IMG2384

## 2018-04-15 MED ORDER — LIDOCAINE HCL 1 % IJ SOLN
INTRAMUSCULAR | Status: AC
  Filled 2018-04-15: qty 20

## 2018-04-22 NOTE — Progress Notes (Signed)
Matthew Lewis   [phone] [phone]   Clinic Follow up Note   Patient Care Team: [name], MD as PCP - General (Internal Medicine)  Date of Service:  04/23/2018  CHIEF COMPLAINT: F/u on metastatic cholangiocarcinoma  SUMMARY OF ONCOLOGIC HISTORY: Oncology History   Cancer Staging Intrahepatic cholangiocarcinoma ([place]) Staging form: Intrahepatic Bile Duct, AJCC 8th Edition - Clinical stage from 10/29/2017: Stage IV (cT2, cN1, pM1) - Signed by [name], MD on 11/06/2017       Malignant neoplasm metastatic to adrenal gland [facility])    Initial Diagnosis    Malignant neoplasm metastatic to adrenal gland ([place])    10/23/2017 Imaging    ABD US IMPRESSION: Cirrhotic appearance of the liver.  2.6 cm questionable ill-defined mass versus area of focal parenchymal heterogeneity in segment 6 of the liver. If further imaging evaluation is desired, liver protocol abdominal MRI should be considered.  Splenomegaly.  Apparent thickening of the gallbladder wall, without evidence of cholelithiasis, and with negative sonographic Murphy's sign. The findings are favored to represent gallbladder wall edema secondary to systemic causes such as hyperproteinemia rather than acute cholecystitis. Please correlate clinically.     10/25/2017 Imaging    MRI IMPRESSION: 1. Advanced changes of cirrhosis. Interval development of multifocal enhancing liver lesions, which is concerning for multicentric hepatocellular carcinoma involving both lobes of liver. 2. Lesion within segment 4 B obstructs the right medial hepatic duct and left main duct resulting in moderate to marked biliary ductal dilatation within the left lobe and dome of liver. There may be partial obstruction of the right lateral duct with mild dilatation. 3. Tumor thrombus within the left branch of the portal vein likely secondary to segment 4 B tumor. 4. Bilateral adrenal metastasis and  retroperitoneal nodal metastasis. 5. Stigmata of portal venous hypertension including varices, ascites and splenomegaly. 6. These results will be called to the ordering clinician or representative by the Radiologist Assistant, and communication documented in the PACS or zVision Dashboard.    10/26/2017 Procedure    IMPRESSION: Status post formal PTC and internal/external biliary drainage.    10/27/2017 Imaging    IMPRESSION: Changes of cirrhosis. Multiple hepatic lesions are noted concerning for multifocal hepatoma or metastases. Biliary drain is in place with the tip in the distal common bile duct. There is mild intrahepatic biliary ductal dilatation. Bilateral adrenal nodules and masses, right larger than left compatible with metastases. Retroperitoneal adenopathy. Large stool burden with mild gaseous distention of the colon. Aortic atherosclerosis.     10/29/2017 Pathology Results    Diagnosis Soft Tissue Needle Core Biopsy, right adrenal mass - METASTATIC ADENOCARCINOMA, SEE COMMENT. Microscopic Comment There are malignant glands with central necrosis. Immunohistochemistry is positive for cytokeratin 20 and CDX-2. Cytokeratin 7, TTF-1 and NapsinA are negative. The morphology and immunoprofile favor a colorectal primary over pancreaticobiliary. [name] has reviewed the case. [name] was notified on 10/31/2017.    11/04/2017 Procedure    Colonoscopy impression - Seton in place found on digital rectal exam. - Congested mucosa in the rectum, in the sigmoid colon and in the descending colon. - A few diminutive polyps in the rectum and in the sigmoid colon. No colon or rectal mass identified. - No specimens collected.    02/19/2018 Imaging    CT CAP IMPRESSION: 1. Slight interval increase in size of left adrenal gland metastasis. Similar-appearing right adrenal gland metastasis. 2. While difficult to measure given ill-defined nature, infiltrative mass within the left  hepatic lobe  is grossly similar when compared to prior exam. Additional lesions within the liver are grossly similar. 3. Interval insertion of biliary drain within the left hepatic lobe with resultant decompression of the left hepatic lobe biliary system. 4. Similar-appearing sharply marginated consolidation within the right lower lobe which may represent infectious/inflammatory process or metastatic disease. 5. Large amount of stool throughout the colon as can be seen with constipation. 6. Persistent left hepatic lobe portal venous thrombosis. 7. Splenomegaly. 8. New moderate volume ascites.     Intrahepatic cholangiocarcinoma ([place])   10/27/2017 Imaging    10/27/2017 CT AP IMPRESSION: Changes of cirrhosis. Multiple hepatic lesions are noted concerning for multifocal hepatoma or metastases.  Biliary drain is in place with the tip in the distal common bile duct. There is mild intrahepatic biliary ductal dilatation.  Bilateral adrenal nodules and masses, right larger than left compatible with metastases.  Retroperitoneal adenopathy.  Large stool burden with mild gaseous distention of the colon.  Aortic atherosclerosis.    10/29/2017 Cancer Staging    Staging form: Intrahepatic Bile Duct, AJCC 8th Edition - Clinical stage from 10/29/2017: Stage IV (cT2, cN1, pM1) - Signed by [name], MD on 11/06/2017    10/29/2017 Pathology Results    10/29/2017 Surgical Pathology Diagnosis Soft Tissue Needle Core Biopsy, right adrenal mass - METASTATIC ADENOCARCINOMA, SEE COMMENT.    11/06/2017 Initial Diagnosis    Intrahepatic cholangiocarcinoma ([place])    11/06/2017 Imaging    11/06/2017 CT AP IMPRESSION: 1. Increase in RIGHT pleural effusion. Stable masslike consolidation at the RIGHT lung base representing neoplasm versus pneumonia. 2. Stable metastatic nodule at the RIGHT lung base. 3. Infiltrative mass in the central LEFT hepatic lobe and discrete lesions in the RIGHT  hepatic lobe consistent with metastatic disease versus primary hepatic biliary carcinoma. Favor cholangiocarcinoma. 4. Percutaneous drainage catheter extends the porta hepatis. Small trapped pneumothorax along the tract of the catheter at the inferolateral RIGHT lung base. 5. Stable adrenal metastasis. 6. Metastatic adenopathy in the retroperitoneum unchanged. 7. Morphologic changes in liver consistent with  cirrhosis.    11/08/2017 Imaging    11/08/2017 CT AP IMPRESSION: 1. Overall no significant interval change since the CT of 11/06/2017. Stable positioning of the percutaneous biliary drain with similar degree of biliary ductal dilatation. 2. Cirrhosis with multiple hepatic metastatic disease. Infiltrative appearing lesion in the left lobe of the liver appears to extend into the left portal vein and may represent a tumor thrombus, or bland thrombus. A primary biliary neoplasm (cholangiocarcinoma, or metastatic disease are other considerations. 3. Retroperitoneal, mesenteric, and adrenal metastatic disease. 4. Hepatic colopathy versus less likely segmental colitis of the hepatic flexure. 5. Small right pleural effusion and bilateral lung base pulmonary masses/nodules as seen previously. 6. Tiny pockets of pneumoperitoneum lateral to the liver along the percutaneous drain, decreased since the prior CT. 7. Other findings as above.    11/20/2017 - 11/24/2017 Hospital Admission    Admit date: 11/08/2017 Admission diagnosis: fatigue Additional comments: discharged 11/24/2017    12/18/2017 -  Chemotherapy    first line chemo gemcitabine/cisplatin on days 1 and 15 every 28 days starting 12/18/17    12/22/2017 - 12/30/2017 Hospital Admission    Admit date: 12/22/2017 Admission diagnosis: Dysuria    12/22/2017 Imaging    12/22/2017 CT AP IMPRESSION: 1. Prominently distended and otherwise normal urinary bladder with mild prostatomegaly. Findings suggest acute bladder  outlet obstruction. No hydronephrosis. 2. Large colorectal stool volume compatible with constipation. New mild circumferential rectal wall thickening and perirectal  fat haziness suggesting stercoral colitis. No free air. No abscess. 3. Stable infiltrative cholangiocarcinoma in the central left liver lobe with associated peripheral intrahepatic biliary ductal dilatation throughout the left liver lobe. 4. Stable well-positioned internal external drain traversing the right liver lobe and decompressing the right liver biliary system. 5. Stable liver, bilateral adrenal, retroperitoneal, left mesenteric and right infrahilar nodal and left lung base metastases. No progressive metastatic disease since 12/03/2017 CT. 6. Cirrhosis. Trace perihepatic ascites, decreased. Stable moderate splenomegaly. 7.  Aortic Atherosclerosis (ICD10-I70.0).    03/11/2018 -  Chemotherapy    The patient had palonosetron (ALOXI) injection 0.25 mg, 0.25 mg, Intravenous,  Once, 3 of 4 cycles Administration: 0.25 mg (03/26/2018), 0.25 mg (04/09/2018) pegfilgrastim (NEULASTA) injection 6 mg, 6 mg, Subcutaneous, Once, 3 of 4 cycles Administration: 6 mg (04/11/2018) leucovorin 840 mg in sodium chloride 0.9 % 250 mL infusion, 400 mg/m2 = 840 mg, Intravenous,  Once, 4 of 5 cycles Administration: 840 mg (03/11/2018), 840 mg (03/26/2018), 840 mg (04/09/2018) oxaliplatin (ELOXATIN) 150 mg in dextrose 5 % 500 mL chemo infusion, 71 mg/m2 = 145 mg (100 % of original dose 70 mg/m2), Intravenous,  Once, 2 of 3 cycles Dose modification: 70 mg/m2 (original dose 70 mg/m2, Cycle 2, Reason: Provider Judgment, Comment: thrombocytopenia ) Administration: 150 mg (03/26/2018), 150 mg (04/09/2018) fluorouracil (ADRUCIL) 4,200 mg in sodium chloride 0.9 % 66 mL chemo infusion, 2,000 mg/m2 = 4,200 mg (100 % of original dose 2,000 mg/m2), Intravenous, 1 Day/Dose, 4 of 5 cycles Dose modification: 2,000 mg/m2 (original dose 2,000 mg/m2, Cycle 1, Reason: Provider  Judgment) Administration: 4,200 mg (03/11/2018), 5,000 mg (03/26/2018), 5,000 mg (04/09/2018)  for chemotherapy treatment.       CURRENT THERAPY:  Second line FOLFOX every 2 weeks started 03/11/2018  INTERVAL HISTORY:  Matthew Lewis is here for a follow up and treatment. He presents to the clinic today by himself. He notes he is not doing well. He right side draining tube is leaking about 1.5Liters. He has his draining tubes changes last week. He changes his bags several times a day.  He notes he fell 2 times 7 days ago from standing up quickly. He has been on antihistamines such as allegra, Fexofenadine and Hydralazine due to allergies. He notes he has been itching on his entire upper body. He notes he stays inside to avoid nasal and sinus congestion. He is willing to cut one of his medications. He denies cough or fever.  He notes having nausea and diarrhea yesterday. He notes left shoulder pain from sleeping in a certain position 2 nights ago. He feels his energy level is the past 3 days is moderate. But he wishes to proceed with chemo today.  He felt his last cycle was stable overall.    REVIEW OF SYSTEMS:   Constitutional: Denies fevers, chills or abnormal weight loss (+) Dizziness (+) fatigue  Eyes: Denies blurriness of vision Ears, nose, mouth, throat, and face: Denies mucositis or sore throat Respiratory: Denies cough, dyspnea or wheezes Cardiovascular: Denies palpitation, chest discomfort or lower extremity swelling Gastrointestinal:  Denies nausea, heartburn or change in bowel habits (+) right side draining tube leaking  MSK: (+) Left shoulder pain Skin: Denies abnormal skin rashes (+) severe skin itching of upper body  Lymphatics: Denies new lymphadenopathy or easy bruising Neurological:Denies numbness, tingling or new weaknesses Behavioral/Psych: Mood is stable, no new changes  All other systems were reviewed with the patient and are negative.  MEDICAL HISTORY:  Past Medical  History:  Diagnosis Date   Abscess of anal and rectal regions    horse shoe abscess   Alcoholic cirrhosis ([place])    Arthritis    "everywhere; especially in my spine"   Cirrhosis ([place])    STAGE 1 CIRRHOSIS PATIENT SEES DR NAMDIGAM FOR   Colon cancer ([place]) 05/2005   TX SURGERY WITH LYMPH NODE REMOVAL   Cough    LAST FEW WEEKS SAW DR RAMOS LUNGS MILKY SPUTUM [place]   DM type 2 with diabetic peripheral neuropathy ([place])    left foot   Fatigue    Fistula, anal    multiple   History of substance abuse ([place])    last alcohol was 08/2011; last marijuania was 08/2011   Hypertension    PTSD (post-traumatic stress disorder)    "severe" HX OF   Sleep deprivation     SURGICAL HISTORY: Past Surgical History:  Procedure Laterality Date   ABSCESS DRAINAGE     "probably 15-20 so far at least; rectal"   ANAL FISTULECTOMY  12/13/1998   ANTERIOR FUSION CERVICAL SPINE  2010   "triple"; "did 2 surgeries on the same day in [id number] due to complications"   CORONARY ANGIOPLASTY  2001   detached muscle  2010   right chest; "after cervical fusion complications"   ELBOW SURGERY  2007   "cut out part of a muscle"; left   ESOPHAGEAL BANDING  02/25/2018   Procedure: ESOPHAGEAL BANDING;  Surgeon: Ladene Artist, MD;  Location: [facility];  Service: Endoscopy;;   ESOPHAGOGASTRODUODENOSCOPY (EGD) WITH PROPOFOL N/A 02/25/2018   Procedure: ESOPHAGOGASTRODUODENOSCOPY (EGD) WITH PROPOFOL;  Surgeon: Ladene Artist, MD;  Location: [facility];  Service: Endoscopy;  Laterality: N/A;   EXAMINATION UNDER ANESTHESIA  03/22/2005   fistula   EXAMINATION UNDER ANESTHESIA  06/06/2011   Procedure: EXAM UNDER ANESTHESIA;  Surgeon: Stark Klein, MD;  Location: [facility];  Service: General;  Laterality: N/A;   HEMICOLECTOMY  06/06/2005   left   INCISION AND DRAINAGE PERIRECTAL ABSCESS  03/30/2005   INCISION AND DRAINAGE PERIRECTAL ABSCESS  12/13/2004   INCISION AND DRAINAGE PERIRECTAL ABSCESS  07/14/2001    INCISION AND DRAINAGE PERIRECTAL ABSCESS  08/12/2010   horseshoe abscess; Dr Redmond Pulling   INCISION AND DRAINAGE PERIRECTAL ABSCESS  06/06/2011   Procedure: IRRIGATION AND DEBRIDEMENT PERIRECTAL ABSCESS;  Surgeon: Stark Klein, MD;  Location: [facility];  Service: General;  Laterality: N/A;   INCISION AND DRAINAGE PERIRECTAL ABSCESS N/A 02/24/2017   Procedure: IRRIGATION AND DEBRIDEMENT PERIRECTAL ABSCESS;  Surgeon: Ralene Ok, MD;  Location: [facility];  Service: General;  Laterality: N/A;   IR CATHETER TUBE CHANGE  02/19/2018   IR CHOLANGIOGRAM EXISTING TUBE  12/23/2017   IR CHOLANGIOGRAM EXISTING TUBE  01/02/2018   IR EXCHANGE BILIARY DRAIN  11/07/2017   IR EXCHANGE BILIARY DRAIN  12/09/2017   IR EXCHANGE BILIARY DRAIN  02/03/2018   IR EXCHANGE BILIARY DRAIN  02/03/2018   IR EXCHANGE BILIARY DRAIN  04/03/2018   IR EXCHANGE BILIARY DRAIN  04/03/2018   IR FLUORO RM 30-60 MIN  04/15/2018   IR IMAGING GUIDED PORT INSERTION  12/09/2017   IR INT EXT BILIARY DRAIN WITH CHOLANGIOGRAM  10/26/2017   IR INT EXT BILIARY DRAIN WITH CHOLANGIOGRAM  12/23/2017   IR PARACENTESIS  02/25/2018   IR PATIENT EVAL TECH 0-60 MINS  12/02/2017   IR RADIOLOGIST EVAL & MGMT  11/06/2017   IR RADIOLOGIST EVAL & MGMT  01/05/2018   LUMBAR [place]   PERCUTANEOUS PINNING  PHALANX FRACTURE OF HAND  ~ 2008   "plates in 2 places"; right   PLACEMENT OF SETON N/A 09/05/2017   Procedure: PLACEMENT OF SETON;  Surgeon: Leighton Ruff, MD;  Location: [facility];  Service: General;  Laterality: N/A;   RADIOLOGY WITH ANESTHESIA N/A 10/25/2017   Procedure: MRI WITH ANESTHESIA;  Surgeon: Radiologist, Medication, MD;  Location: [facility];  Service: Radiology;  Laterality: N/A;   THORACIC DISCECTOMY  1990's    I have reviewed the social history and family history with the patient and they are unchanged from previous note.  ALLERGIES:  is allergic to morphine and related.  MEDICATIONS:  Current  Outpatient Medications  Medication Sig Dispense Refill   Ca Carbonate-Mag Hydroxide (ROLAIDS PO) Take 1-2 tablets by mouth 2 (two) times daily as needed (heartburn).     fexofenadine (ALLEGRA) 180 MG tablet Take 180 mg by mouth at bedtime.     gabapentin (NEURONTIN) 400 MG capsule Take 400-800 mg by mouth See admin instructions. Take 400 mg by mouth in the morning and 800 mg in the evening     insulin detemir (LEVEMIR) 100 UNIT/ML injection Inject 10-62 Units into the skin See admin instructions. Take 62 in the am and 10 in the pm     lactose free nutrition (BOOST PLUS) LIQD Take 237 mLs by mouth 3 (three) times daily with meals. (Patient taking differently: Take 237 mLs by mouth 3 (three) times daily with meals. Ensure chocolate and vanilla max protein) 30 Can 0   levocetirizine (XYZAL) 5 MG tablet Take 5 mg by mouth every evening.     lidocaine-prilocaine (EMLA) cream Apply to port site 1 hour prior to access (Patient taking differently: Apply 1 application topically once. Apply to port site 1 hour prior to access) 30 g 0   Multiple Vitamin (MULTIVITAMIN) tablet Take 1 tablet by mouth daily.     pantoprazole (PROTONIX) 40 MG tablet Take 1 tablet (40 mg total) by mouth 2 (two) times daily. 60 tablet 0   Sodium Chloride Flush (NORMAL SALINE FLUSH) 0.9 % SOLN Inject 10 mLs into the vein daily. 30 Syringe 2   Sodium Chloride Flush (NORMAL SALINE FLUSH) 0.9 % SOLN Inject 10 mLs into the vein daily. 30 Syringe 2   tamsulosin (FLOMAX) 0.4 MG CAPS capsule TAKE 1 CAPSULE(0.4 MG) BY MOUTH DAILY AFTER SUPPER 90 capsule 0   propranolol ER (INDERAL LA) 60 MG 24 hr capsule Take 1 capsule (60 mg total) by mouth daily for 30 days. 30 capsule 0   No current facility-administered medications for this visit.    Facility-Administered Medications Ordered in Other Visits  Medication Dose Route Frequency Provider Last Rate Last Dose   0.9 %  sodium chloride infusion   Intravenous Continuous [name],  MD 500 mL/hr at 04/23/18 1157     dexamethasone (DECADRON) injection 10 mg  10 mg Intravenous Once [name], MD       fluorouracil (ADRUCIL) 5,050 mg in sodium chloride 0.9 % 149 mL chemo infusion  2,400 mg/m2 (Treatment Plan Recorded) Intravenous 1 day or 1 dose [name], MD       leucovorin 840 mg in dextrose 5 % 250 mL infusion  400 mg/m2 (Treatment Plan Recorded) Intravenous Once [name], MD       palonosetron (ALOXI) injection 0.25 mg  0.25 mg Intravenous Once [name], MD        PHYSICAL EXAMINATION: ECOG PERFORMANCE STATUS: 2 - Symptomatic, <50% confined to bed  Vitals:   04/23/18 1154 04/23/18 1155  BP:  (!) 85/64  Pulse:  (!) 108  Resp: 17 18  Temp:    SpO2:     Filed Weights   04/23/18 1116  Weight: 182 lb 6.4 oz (82.7 kg)    GENERAL:alert, no distress and comfortable SKIN: skin color, texture, turgor are normal, no rashes or significant lesions EYES: normal, Conjunctiva are pink and non-injected, sclera clear OROPHARYNX:no exudate, no erythema and lips, buccal mucosa, and tongue normal  NECK: supple, thyroid normal size, non-tender, without nodularity LYMPH:  no palpable lymphadenopathy in the cervical, axillary or inguinal LUNGS: clear to auscultation and percussion with normal breathing effort HEART: regular rate & rhythm and no murmurs and no lower extremity edema ABDOMEN:abdomen soft, non-tender and normal bowel sounds (+) right side and left abdomen draining tubes. Right tube is leaking  Musculoskeletal:no cyanosis of digits and no clubbing  NEURO: alert & oriented x 3 with fluent speech, no focal motor/sensory deficits  LABORATORY DATA:  I have reviewed the data as listed CBC Latest Ref Rng & Units 04/23/2018 04/09/2018 03/26/2018  WBC 4.0 - 10.5 K/uL 13.3(H) 8.5 7.3  Hemoglobin 13.0 - 17.0 g/dL 10.3(L) 10.5(L) 10.3(L)  Hematocrit 39.0 - 52.0 % 31.6(L) 32.8(L) 32.4(L)  Platelets 150 - 400 K/uL 141(L) 172 126(L)     CMP Latest Ref Rng & Units  04/23/2018 04/09/2018 03/26/2018  Glucose 70 - 99 mg/dL 196(H) 204(H) 265(H)  BUN 8 - 23 mg/dL 14 14 15   Creatinine 0.61 - 1.24 mg/dL 0.74 0.80 0.86  Sodium 135 - 145 mmol/L 127(L) 133(L) 133(L)  Potassium 3.5 - 5.1 mmol/L 3.9 3.9 4.2  Chloride 98 - 111 mmol/L 100 102 103  CO2 22 - 32 mmol/L 19(L) 22 22  Calcium 8.9 - 10.3 mg/dL 8.9 9.3 9.1  Total Protein 6.5 - 8.1 g/dL 7.9 8.5(H) 8.5(H)  Total Bilirubin 0.3 - 1.2 mg/dL 1.4(H) 1.1 1.2  Alkaline Phos 38 - 126 U/L 241(H) 346(H) 405(H)  AST 15 - 41 U/L 42(H) 31 37  ALT 0 - 44 U/L 46(H) 24 31      RADIOGRAPHIC STUDIES: I have personally reviewed the radiological images as listed and agreed with the findings in the report. No results found.   ASSESSMENT & PLAN:  Matthew Lewis is a 63 y.o. male with   1. Metastaticintrahepatic cholangiocarcinomato right adrenal gland, liver, lungs and nodes -Diagnosed in 10/2017. He had 2 biliary drainage placements. His cancer treatment was postponed due to multiple hospitalization for infection, dehydration, it etc. Hewasonfirst lineCisplatin and gemcitabinestopped on 02/20/2018 because his restaging CT scan showed enlarged adrenal glad metastasis consistent disease progression. -He has had multiple hospitalizations since his diagnosis of cancer for infection, dehydration and GI bleeding  -He startedsecond linetreatmentFOLFOXon 03/11/2018 and was only given5-FU and leucovorin on for first cycle due to recent GI bleeding and will continue treatment every 2 weeks.And then Oxaliplatin was added with cycle 2.  -He is doing moderately well. He has had dizziness from allergy medication use. He has been fatigued along with nausea and diarrhea yesterday. His right biliary draining tube is leaking lately.  -Labs revewied, CBC shows 13.3, Hg 10.3, PLT 141K, ANC 10.8, CMP showed mildly elevated AST ALT, and tbil 1.4. Ca 19.9 still pending. Given elevated WBC and ANC I instructed him to check his fever  daily to monitor for signs of infection.  -Given fatigue and biliary draining tube leaking, I will hold Oxaliplatin today. Will proceed with 5FU and  Leucovorin today.  -I will contact IR to evaluate his draining tubes.  -F/u in 2 weeks   2.Obstructive jaundice, status post bilateral percutaneous biliary drain -drained tube exchange1/28, both were capped. Due to bilary leakage from left side this morning, the left drain tube is connected to a bag now,it is draining well, and his output has decreased. -His right drain tube is capped and his left drain tube. His right tube has been draining more lately. I encouraged him to drink plenty of fluids.  -f/u with IR -continue monitoring LFTs -He is only mildly jaundiced. His right draining tube is leaking today and slightly through clothes. He has had 1.5 Liter output. Will contact IR to evaluate his tubes.  -I encouraged him to drink 3 liters a day of fluid. He is agreeable.   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 Portal vein thrombosis -He is on aspirin, no anticoagulation due toinitialbloody biliary drainage  5. DM, HTN -f/u with PCP  6.H/o substance abuse and addiction -He's been clean for 6 years now -will becautious about narcoticsandbenzo.  7. Anemia  -Secondary to chemotherapy, recent GI bleedingand underlying malignancy -He received 5 unit blood transfusion during his hospitalization for GI bleeding, anemia improved.  -Hg stable at 10.3 today, no need for blood transfusion. (04/23/18)  8. GIbleeding fromduodenal ulcers -Patient developed upper GI bleeding on February 24, 2018, required hospitalization, and multiple blood transfusion. -Bleeding has stopped and is possibly related to NSAIDs  -Continue Protonix twice daily indefinitelyper GI Dr. Fuller Plan   9.Goal of care discussion, DNR/DNI -Wepreviouslydiscussed code status in length.I recommend DNR/DNI,he agrees. -He  understands his cancer is not curable, and the goal of therapy is to prolong his life and improve his quality of life.  10. Lower appetite, weight loss  -I encouraged him to increase calorie and protein in take to about 2000 cal a day  -He will continue Ensure daily  -weight continues to trend down.   11. Seasonal Allergies -He fell twice due to dizziness from antihistamine use.  -He is on he has been on antihistamines such as allegra, Fexofenadine and Hydralazine due to allergies. He will hold Hydralazine for now.   12. Dizziness with falls, orthostatic hypotension  -checked orthostatic BP today, SBP dropped to 80's  -likely dehydration from high biliary output  -will give NS 1L today  -repeat lab and IVF next week   Plan -I reviewed labs today,adequate to continuecycle 3 chemotherapy today. Will hold Oxaliplatin today, and give 5-fu and LV only  -NS 1L over 2 hours for orthostatic hypotension  -lab, flush, f/u with Lacie and IVF 2hr next week -Lab, flush, f/u and treatment in 2 weeks -He will go to IR to evaluate his biliary drainage leakage issue   No problem-specific Assessment & Plan notes found for this encounter.   Orders Placed This Encounter  Procedures   Orthostatic vital signs    Standing Status:   Standing    Number of Occurrences:   1   All questions were answered. The patient knows to call the clinic with any problems, questions or concerns. No barriers to learning was detected. I spent 25 minutes counseling the patient face to face. The total time spent in the appointment was 30 minutes and more than 50% was on counseling and review of test results     [name], MD 04/23/2018   I, Joslyn Devon, am acting as scribe for [name], MD.   I have reviewed the  above documentation for accuracy and completeness, and I agree with the above.

## 2018-04-23 ENCOUNTER — Inpatient Hospital Stay: Payer: Medicare Other

## 2018-04-23 ENCOUNTER — Telehealth: Payer: Self-pay | Admitting: Hematology

## 2018-04-23 ENCOUNTER — Other Ambulatory Visit: Payer: Self-pay

## 2018-04-23 ENCOUNTER — Inpatient Hospital Stay (HOSPITAL_BASED_OUTPATIENT_CLINIC_OR_DEPARTMENT_OTHER): Payer: Medicare Other | Admitting: Hematology

## 2018-04-23 ENCOUNTER — Other Ambulatory Visit: Payer: Self-pay | Admitting: Physician Assistant

## 2018-04-23 ENCOUNTER — Ambulatory Visit (HOSPITAL_COMMUNITY)
Admission: RE | Admit: 2018-04-23 | Discharge: 2018-04-23 | Disposition: A | Discharge status: Home / Self Care | Payer: Medicare Other | Source: Ambulatory Visit | Attending: Physician Assistant | Admitting: Physician Assistant

## 2018-04-23 ENCOUNTER — Encounter: Payer: Self-pay | Admitting: Hematology

## 2018-04-23 ENCOUNTER — Inpatient Hospital Stay: Payer: Medicare Other | Admitting: Nutrition

## 2018-04-23 VITALS — BP 108/72 | HR 97 | Resp 18

## 2018-04-23 VITALS — BP 85/64 | HR 108 | Temp 98.0°F | Resp 18 | Ht 72.0 in | Wt 182.4 lb

## 2018-04-23 DIAGNOSIS — C221 Intrahepatic bile duct carcinoma: Secondary | ICD-10-CM | POA: Insufficient documentation

## 2018-04-23 DIAGNOSIS — C7972 Secondary malignant neoplasm of left adrenal gland: Secondary | ICD-10-CM | POA: Diagnosis not present

## 2018-04-23 DIAGNOSIS — C7802 Secondary malignant neoplasm of left lung: Secondary | ICD-10-CM

## 2018-04-23 DIAGNOSIS — R42 Dizziness and giddiness: Secondary | ICD-10-CM

## 2018-04-23 DIAGNOSIS — K7030 Alcoholic cirrhosis of liver without ascites: Secondary | ICD-10-CM | POA: Diagnosis not present

## 2018-04-23 DIAGNOSIS — I959 Hypotension, unspecified: Secondary | ICD-10-CM

## 2018-04-23 DIAGNOSIS — C7971 Secondary malignant neoplasm of right adrenal gland: Secondary | ICD-10-CM

## 2018-04-23 DIAGNOSIS — E119 Type 2 diabetes mellitus without complications: Secondary | ICD-10-CM | POA: Diagnosis not present

## 2018-04-23 DIAGNOSIS — Z4803 Encounter for change or removal of drains: Secondary | ICD-10-CM | POA: Diagnosis not present

## 2018-04-23 DIAGNOSIS — T85638A Leakage of other specified internal prosthetic devices, implants and grafts, initial encounter: Secondary | ICD-10-CM | POA: Diagnosis not present

## 2018-04-23 DIAGNOSIS — I81 Portal vein thrombosis: Secondary | ICD-10-CM | POA: Diagnosis not present

## 2018-04-23 DIAGNOSIS — I10 Essential (primary) hypertension: Secondary | ICD-10-CM | POA: Diagnosis not present

## 2018-04-23 DIAGNOSIS — C7801 Secondary malignant neoplasm of right lung: Secondary | ICD-10-CM | POA: Diagnosis not present

## 2018-04-23 DIAGNOSIS — Z794 Long term (current) use of insulin: Secondary | ICD-10-CM | POA: Diagnosis not present

## 2018-04-23 DIAGNOSIS — Z95828 Presence of other vascular implants and grafts: Secondary | ICD-10-CM

## 2018-04-23 DIAGNOSIS — C772 Secondary and unspecified malignant neoplasm of intra-abdominal lymph nodes: Secondary | ICD-10-CM | POA: Diagnosis not present

## 2018-04-23 DIAGNOSIS — D6481 Anemia due to antineoplastic chemotherapy: Secondary | ICD-10-CM | POA: Diagnosis not present

## 2018-04-23 DIAGNOSIS — Z5111 Encounter for antineoplastic chemotherapy: Secondary | ICD-10-CM | POA: Diagnosis not present

## 2018-04-23 DIAGNOSIS — Z79899 Other long term (current) drug therapy: Secondary | ICD-10-CM | POA: Diagnosis not present

## 2018-04-23 DIAGNOSIS — Z7189 Other specified counseling: Secondary | ICD-10-CM

## 2018-04-23 HISTORY — PX: IR EXCHANGE BILIARY DRAIN: IMG6046

## 2018-04-23 LAB — CBC WITH DIFFERENTIAL (CANCER CENTER ONLY)
Abs Immature Granulocytes: 0.06 10*3/uL (ref 0.00–0.07)
Basophils Absolute: 0 10*3/uL (ref 0.0–0.1)
Basophils Relative: 0 %
Eosinophils Absolute: 0.1 10*3/uL (ref 0.0–0.5)
Eosinophils Relative: 1 %
HCT: 31.6 % — ABNORMAL LOW (ref 39.0–52.0)
Hemoglobin: 10.3 g/dL — ABNORMAL LOW (ref 13.0–17.0)
Immature Granulocytes: 1 %
Lymphocytes Relative: 7 %
Lymphs Abs: 0.9 10*3/uL (ref 0.7–4.0)
MCH: 31 pg (ref 26.0–34.0)
MCHC: 32.6 g/dL (ref 30.0–36.0)
MCV: 95.2 fL (ref 80.0–100.0)
Monocytes Absolute: 1.4 10*3/uL — ABNORMAL HIGH (ref 0.1–1.0)
Monocytes Relative: 10 %
Neutro Abs: 10.8 10*3/uL — ABNORMAL HIGH (ref 1.7–7.7)
Neutrophils Relative %: 81 %
Platelet Count: 141 10*3/uL — ABNORMAL LOW (ref 150–400)
RBC: 3.32 MIL/uL — ABNORMAL LOW (ref 4.22–5.81)
RDW: 17.6 % — ABNORMAL HIGH (ref 11.5–15.5)
WBC Count: 13.3 10*3/uL — ABNORMAL HIGH (ref 4.0–10.5)
nRBC: 0 % (ref 0.0–0.2)

## 2018-04-23 LAB — CMP (CANCER CENTER ONLY)
ALT: 46 U/L — ABNORMAL HIGH (ref 0–44)
AST: 42 U/L — ABNORMAL HIGH (ref 15–41)
Albumin: 2.9 g/dL — ABNORMAL LOW (ref 3.5–5.0)
Alkaline Phosphatase: 241 U/L — ABNORMAL HIGH (ref 38–126)
Anion gap: 8 (ref 5–15)
BUN: 14 mg/dL (ref 8–23)
CO2: 19 mmol/L — ABNORMAL LOW (ref 22–32)
Calcium: 8.9 mg/dL (ref 8.9–10.3)
Chloride: 100 mmol/L (ref 98–111)
Creatinine: 0.74 mg/dL (ref 0.61–1.24)
GFR, Est AFR Am: >60 mL/min (ref >60)
GFR, Estimated: >60 mL/min (ref >60)
Glucose, Bld: 196 mg/dL — ABNORMAL HIGH (ref 70–99)
Potassium: 3.9 mmol/L (ref 3.5–5.1)
Sodium: 127 mmol/L — ABNORMAL LOW (ref 135–145)
Total Bilirubin: 1.4 mg/dL — ABNORMAL HIGH (ref 0.3–1.2)
Total Protein: 7.9 g/dL (ref 6.5–8.1)

## 2018-04-23 MED ORDER — DEXAMETHASONE SODIUM PHOSPHATE 10 MG/ML IJ SOLN: 10.0000 mg | Freq: Once | INTRAMUSCULAR | Status: DC

## 2018-04-23 MED ORDER — PALONOSETRON HCL INJECTION 0.25 MG/5ML
0.2500 mg | Freq: Once | INTRAVENOUS | Status: AC
  Administered 2018-04-23: 13:00:00 0.25 mg via INTRAVENOUS

## 2018-04-23 MED ORDER — LIDOCAINE HCL 1 % IJ SOLN
INTRAMUSCULAR | Status: AC
  Filled 2018-04-23: qty 20

## 2018-04-23 MED ORDER — NORMAL SALINE FLUSH 0.9 % IV SOLN: 10.0000 mL | Freq: Every day | INTRAVENOUS | 2 refills | Status: DC

## 2018-04-23 MED ORDER — SODIUM CHLORIDE 0.9% FLUSH
10.0000 mL | INTRAVENOUS | Status: DC | PRN
  Administered 2018-04-23: 11:00:00 10 mL
  Filled 2018-04-23: qty 10

## 2018-04-23 MED ORDER — LIDOCAINE HCL 1 % IJ SOLN
INTRAMUSCULAR | Status: DC | PRN
  Administered 2018-04-23: 5 mL

## 2018-04-23 MED ORDER — DEXTROSE 5 % IV SOLN
Freq: Once | INTRAVENOUS | Status: AC
  Administered 2018-04-23: 14:00:00 via INTRAVENOUS
  Filled 2018-04-23: qty 250

## 2018-04-23 MED ORDER — SODIUM CHLORIDE 0.9 % IV SOLN
INTRAVENOUS | Status: DC
  Administered 2018-04-23: 12:00:00 via INTRAVENOUS
  Filled 2018-04-23 (×2): qty 250

## 2018-04-23 MED ORDER — SODIUM CHLORIDE 0.9 % IV SOLN
2390.0000 mg/m2 | INTRAVENOUS | Status: DC
  Administered 2018-04-23: 5000 mg via INTRAVENOUS
  Filled 2018-04-23: qty 100

## 2018-04-23 MED ORDER — LEUCOVORIN CALCIUM INJECTION 350 MG
400.0000 mg/m2 | Freq: Once | INTRAVENOUS | Status: AC
  Administered 2018-04-23: 14:00:00 840 mg via INTRAVENOUS
  Filled 2018-04-23: qty 42

## 2018-04-23 MED ORDER — PALONOSETRON HCL INJECTION 0.25 MG/5ML
INTRAVENOUS | Status: AC
  Filled 2018-04-23: qty 5

## 2018-04-23 MED ORDER — IOHEXOL 300 MG/ML  SOLN
50.0000 mL | Freq: Once | INTRAMUSCULAR | Status: AC | PRN
  Administered 2018-04-23: 5 mL

## 2018-04-23 NOTE — Telephone Encounter (Signed)
Scheduled appt per 4/16 los.

## 2018-04-23 NOTE — Patient Instructions (Signed)
[facility] Discharge Instructions for Patients Receiving Chemotherapy  Today you received the following chemotherapy agents Leucovorin & Flourouracil (ADRUCIL).  To help prevent nausea and vomiting after your treatment, we encourage you to take your nausea medication as prescribed.   If you develop nausea and vomiting that is not controlled by your nausea medication, call the clinic.   BELOW ARE SYMPTOMS THAT SHOULD BE REPORTED IMMEDIATELY:  *FEVER GREATER THAN 100.5 F  *CHILLS WITH OR WITHOUT FEVER  NAUSEA AND VOMITING THAT IS NOT CONTROLLED WITH YOUR NAUSEA MEDICATION  *UNUSUAL SHORTNESS OF BREATH  *UNUSUAL BRUISING OR BLEEDING  TENDERNESS IN MOUTH AND THROAT WITH OR WITHOUT PRESENCE OF ULCERS  *URINARY PROBLEMS  *BOWEL PROBLEMS  UNUSUAL RASH Items with * indicate a potential emergency and should be followed up as soon as possible.  Feel free to call the clinic should you have any questions or concerns. The clinic phone number is [phone].  Please show the [place] at check-in to the Emergency Department and triage nurse.  Coronavirus (COVID-19) Are you at risk?  Are you at risk for the Coronavirus (COVID-19)?  To be considered HIGH RISK for Coronavirus (COVID-19), you have to meet the following criteria:  . Traveled to [place], [place], [place], [place] or [place]; or in the [place] to [place], [place], [place], or [place]; and have fever, cough, and shortness of breath within the last 2 weeks of travel OR . Been in close contact with a person diagnosed with COVID-19 within the last 2 weeks and have fever, cough, and shortness of breath . IF YOU DO NOT MEET THESE CRITERIA, YOU ARE CONSIDERED LOW RISK FOR COVID-19.  What to do if you are HIGH RISK for COVID-19?  Marland Kitchen If you are having a medical emergency, call 911. . Seek medical care right away. Before you go to a doctor's office, urgent care or emergency department, call ahead  and tell them about your recent travel, contact with someone diagnosed with COVID-19, and your symptoms. You should receive instructions from your physician's office regarding next steps of care.  . When you arrive at healthcare provider, tell the healthcare staff immediately you have returned from visiting [place], [place], [place], [place] or [place]; or traveled in the [place] to [place], [place], [place], or [place]; in the last two weeks or you have been in close contact with a person diagnosed with COVID-19 in the last 2 weeks.   . Tell the health care staff about your symptoms: fever, cough and shortness of breath. . After you have been seen by a medical provider, you will be either: o Tested for (COVID-19) and discharged home on quarantine except to seek medical care if symptoms worsen, and asked to  - Stay home and avoid contact with others until you get your results (4-5 days)  - Avoid travel on public transportation if possible (such as bus, train, or airplane) or o Sent to the Emergency Department by EMS for evaluation, COVID-19 testing, and possible admission depending on your condition and test results.  What to do if you are LOW RISK for COVID-19?  Reduce your risk of any infection by using the same precautions used for avoiding the common cold or flu:  Marland Kitchen Wash your hands often with soap and warm water for at least 20 seconds.  If soap and water are not readily available, use an alcohol-based hand sanitizer with at least 60% alcohol.  . If  coughing or sneezing, cover your mouth and nose by coughing or sneezing into the elbow areas of your shirt or coat, into a tissue or into your sleeve (not your hands). . Avoid shaking hands with others and consider head nods or verbal greetings only. . Avoid touching your eyes, nose, or mouth with unwashed hands.  . Avoid close contact with people who are sick. . Avoid places or events with large numbers of people in one location, like  concerts or sporting events. . Carefully consider travel plans you have or are making. . If you are planning any travel outside or inside the [place], visit the CDC's Travelers' Health webpage for the latest health notices. . If you have some symptoms but not all symptoms, continue to monitor at home and seek medical attention if your symptoms worsen. . If you are having a medical emergency, call 911.   [place] / e-Visit: [email or web address]         [facility]: [facility]: [phone]                   [facility]: [phone]

## 2018-04-23 NOTE — Progress Notes (Signed)
RD working remotely.  Contacted patient however he was unavailable. I left a message with my name and contact number for patient to call me back.

## 2018-04-23 NOTE — Progress Notes (Signed)
Patient will receive 1 liter of iv fluids today per [name] ok to treat.  Only treatment today will be Leucovorin and 5FU pump.

## 2018-04-24 ENCOUNTER — Encounter: Payer: Self-pay | Admitting: Hematology

## 2018-04-24 LAB — CANCER ANTIGEN 19-9: CA 19-9: 25 U/mL (ref 0–35)

## 2018-04-25 ENCOUNTER — Inpatient Hospital Stay: Payer: Medicare Other

## 2018-04-25 ENCOUNTER — Other Ambulatory Visit: Payer: Self-pay

## 2018-04-25 VITALS — BP 127/77 | HR 100 | Temp 98.9°F | Resp 18

## 2018-04-25 DIAGNOSIS — C7801 Secondary malignant neoplasm of right lung: Secondary | ICD-10-CM | POA: Diagnosis not present

## 2018-04-25 DIAGNOSIS — C221 Intrahepatic bile duct carcinoma: Secondary | ICD-10-CM | POA: Diagnosis not present

## 2018-04-25 DIAGNOSIS — D6481 Anemia due to antineoplastic chemotherapy: Secondary | ICD-10-CM | POA: Diagnosis not present

## 2018-04-25 DIAGNOSIS — I10 Essential (primary) hypertension: Secondary | ICD-10-CM | POA: Diagnosis not present

## 2018-04-25 DIAGNOSIS — R42 Dizziness and giddiness: Secondary | ICD-10-CM | POA: Diagnosis not present

## 2018-04-25 DIAGNOSIS — E119 Type 2 diabetes mellitus without complications: Secondary | ICD-10-CM | POA: Diagnosis not present

## 2018-04-25 DIAGNOSIS — C772 Secondary and unspecified malignant neoplasm of intra-abdominal lymph nodes: Secondary | ICD-10-CM | POA: Diagnosis not present

## 2018-04-25 DIAGNOSIS — I81 Portal vein thrombosis: Secondary | ICD-10-CM | POA: Diagnosis not present

## 2018-04-25 DIAGNOSIS — Z7189 Other specified counseling: Secondary | ICD-10-CM

## 2018-04-25 DIAGNOSIS — Z794 Long term (current) use of insulin: Secondary | ICD-10-CM | POA: Diagnosis not present

## 2018-04-25 DIAGNOSIS — Z79899 Other long term (current) drug therapy: Secondary | ICD-10-CM | POA: Diagnosis not present

## 2018-04-25 DIAGNOSIS — C7972 Secondary malignant neoplasm of left adrenal gland: Secondary | ICD-10-CM | POA: Diagnosis not present

## 2018-04-25 DIAGNOSIS — C7802 Secondary malignant neoplasm of left lung: Secondary | ICD-10-CM | POA: Diagnosis not present

## 2018-04-25 DIAGNOSIS — I959 Hypotension, unspecified: Secondary | ICD-10-CM | POA: Diagnosis not present

## 2018-04-25 DIAGNOSIS — C7971 Secondary malignant neoplasm of right adrenal gland: Secondary | ICD-10-CM | POA: Diagnosis not present

## 2018-04-25 DIAGNOSIS — Z5111 Encounter for antineoplastic chemotherapy: Secondary | ICD-10-CM | POA: Diagnosis not present

## 2018-04-25 MED ORDER — SODIUM CHLORIDE 0.9% FLUSH
10.0000 mL | INTRAVENOUS | Status: DC | PRN
  Administered 2018-04-25: 10 mL
  Filled 2018-04-25: qty 10

## 2018-04-25 MED ORDER — HEPARIN SOD (PORK) LOCK FLUSH 100 UNIT/ML IV SOLN
500.0000 [IU] | Freq: Once | INTRAVENOUS | Status: AC | PRN
  Administered 2018-04-25: 500 [IU]
  Filled 2018-04-25: qty 5

## 2018-04-25 MED ORDER — PEGFILGRASTIM INJECTION 6 MG/0.6ML ~~LOC~~
6.0000 mg | PREFILLED_SYRINGE | Freq: Once | SUBCUTANEOUS | Status: AC
  Administered 2018-04-25: 6 mg via SUBCUTANEOUS

## 2018-04-25 MED ORDER — PEGFILGRASTIM INJECTION 6 MG/0.6ML ~~LOC~~
PREFILLED_SYRINGE | SUBCUTANEOUS | Status: AC
  Filled 2018-04-25: qty 0.6

## 2018-04-25 NOTE — Patient Instructions (Signed)
Pegfilgrastim injection  What is this medicine?  PEGFILGRASTIM (PEG fil gra stim) is a long-acting granulocyte colony-stimulating factor that stimulates the growth of neutrophils, a type of white blood cell important in the body's fight against infection. It is used to reduce the incidence of fever and infection in patients with certain types of cancer who are receiving chemotherapy that affects the bone marrow, and to increase survival after being exposed to high doses of radiation.  This medicine may be used for other purposes; ask your health care provider or pharmacist if you have questions.  COMMON BRAND NAME(S): Fulphila, Neulasta, UDENYCA  What should I tell my health care provider before I take this medicine?  They need to know if you have any of these conditions:  -kidney disease  -latex allergy  -ongoing radiation therapy  -sickle cell disease  -skin reactions to acrylic adhesives (On-Body Injector only)  -an unusual or allergic reaction to pegfilgrastim, filgrastim, other medicines, foods, dyes, or preservatives  -pregnant or trying to get pregnant  -breast-feeding  How should I use this medicine?  This medicine is for injection under the skin. If you get this medicine at home, you will be taught how to prepare and give the pre-filled syringe or how to use the On-body Injector. Refer to the patient Instructions for Use for detailed instructions. Use exactly as directed. Tell your healthcare provider immediately if you suspect that the On-body Injector may not have performed as intended or if you suspect the use of the On-body Injector resulted in a missed or partial dose.  It is important that you put your used needles and syringes in a special sharps container. Do not put them in a trash can. If you do not have a sharps container, call your pharmacist or healthcare provider to get one.  Talk to your pediatrician regarding the use of this medicine in children. While this drug may be prescribed for  selected conditions, precautions do apply.  Overdosage: If you think you have taken too much of this medicine contact a poison control center or emergency room at once.  NOTE: This medicine is only for you. Do not share this medicine with others.  What if I miss a dose?  It is important not to miss your dose. Call your doctor or health care professional if you miss your dose. If you miss a dose due to an On-body Injector failure or leakage, a new dose should be administered as soon as possible using a single prefilled syringe for manual use.  What may interact with this medicine?  Interactions have not been studied.  Give your health care provider a list of all the medicines, herbs, non-prescription drugs, or dietary supplements you use. Also tell them if you smoke, drink alcohol, or use illegal drugs. Some items may interact with your medicine.  This list may not describe all possible interactions. Give your health care provider a list of all the medicines, herbs, non-prescription drugs, or dietary supplements you use. Also tell them if you smoke, drink alcohol, or use illegal drugs. Some items may interact with your medicine.  What should I watch for while using this medicine?  You may need blood work done while you are taking this medicine.  If you are going to need a MRI, CT scan, or other procedure, tell your doctor that you are using this medicine (On-Body Injector only).  What side effects may I notice from receiving this medicine?  Side effects that you should report to   your doctor or health care professional as soon as possible:  -allergic reactions like skin rash, itching or hives, swelling of the face, lips, or tongue  -back pain  -dizziness  -fever  -pain, redness, or irritation at site where injected  -pinpoint red spots on the skin  -red or dark-brown urine  -shortness of breath or breathing problems  -stomach or side pain, or pain at the shoulder  -swelling  -tiredness  -trouble passing urine or  change in the amount of urine  Side effects that usually do not require medical attention (report to your doctor or health care professional if they continue or are bothersome):  -bone pain  -muscle pain  This list may not describe all possible side effects. Call your doctor for medical advice about side effects. You may report side effects to FDA at 1-800-FDA-1088.  Where should I keep my medicine?  Keep out of the reach of children.  If you are using this medicine at home, you will be instructed on how to store it. Throw away any unused medicine after the expiration date on the label.  NOTE: This sheet is a summary. It may not cover all possible information. If you have questions about this medicine, talk to your doctor, pharmacist, or health care provider.   2019 Elsevier/Gold Standard (2017-03-31 16:57:08)

## 2018-04-27 ENCOUNTER — Other Ambulatory Visit: Payer: Self-pay | Admitting: Radiology

## 2018-04-27 ENCOUNTER — Other Ambulatory Visit (HOSPITAL_COMMUNITY): Payer: Self-pay | Admitting: Diagnostic Radiology

## 2018-04-27 DIAGNOSIS — K831 Obstruction of bile duct: Secondary | ICD-10-CM

## 2018-04-27 NOTE — Progress Notes (Signed)
Matthew Lewis   [phone] [phone]   Clinic Follow up Note   Patient Care Team: [name], MD as PCP - General (Internal Medicine) 04/29/2018  CHIEF COMPLAINT: follow up metastatic intrahepatic cholangiocarcinoma   SUMMARY OF ONCOLOGIC HISTORY: Oncology History   Cancer Staging Intrahepatic cholangiocarcinoma ([place]) Staging form: Intrahepatic Bile Duct, AJCC 8th Edition - Clinical stage from 10/29/2017: Stage IV (cT2, cN1, pM1) - Signed by [name], MD on 11/06/2017       Malignant neoplasm metastatic to adrenal gland [facility])    Initial Diagnosis    Malignant neoplasm metastatic to adrenal gland ([place])    10/23/2017 Imaging    ABD US IMPRESSION: Cirrhotic appearance of the liver.  2.6 cm questionable ill-defined mass versus area of focal parenchymal heterogeneity in segment 6 of the liver. If further imaging evaluation is desired, liver protocol abdominal MRI should be considered.  Splenomegaly.  Apparent thickening of the gallbladder wall, without evidence of cholelithiasis, and with negative sonographic Murphy's sign. The findings are favored to represent gallbladder wall edema secondary to systemic causes such as hyperproteinemia rather than acute cholecystitis. Please correlate clinically.     10/25/2017 Imaging    MRI IMPRESSION: 1. Advanced changes of cirrhosis. Interval development of multifocal enhancing liver lesions, which is concerning for multicentric hepatocellular carcinoma involving both lobes of liver. 2. Lesion within segment 4 B obstructs the right medial hepatic duct and left main duct resulting in moderate to marked biliary ductal dilatation within the left lobe and dome of liver. There may be partial obstruction of the right lateral duct with mild dilatation. 3. Tumor thrombus within the left branch of the portal vein likely secondary to segment 4 B tumor. 4. Bilateral adrenal metastasis and retroperitoneal  nodal metastasis. 5. Stigmata of portal venous hypertension including varices, ascites and splenomegaly. 6. These results will be called to the ordering clinician or representative by the Radiologist Assistant, and communication documented in the PACS or zVision Dashboard.    10/26/2017 Procedure    IMPRESSION: Status post formal PTC and internal/external biliary drainage.    10/27/2017 Imaging    IMPRESSION: Changes of cirrhosis. Multiple hepatic lesions are noted concerning for multifocal hepatoma or metastases. Biliary drain is in place with the tip in the distal common bile duct. There is mild intrahepatic biliary ductal dilatation. Bilateral adrenal nodules and masses, right larger than left compatible with metastases. Retroperitoneal adenopathy. Large stool burden with mild gaseous distention of the colon. Aortic atherosclerosis.     10/29/2017 Pathology Results    Diagnosis Soft Tissue Needle Core Biopsy, right adrenal mass - METASTATIC ADENOCARCINOMA, SEE COMMENT. Microscopic Comment There are malignant glands with central necrosis. Immunohistochemistry is positive for cytokeratin 20 and CDX-2. Cytokeratin 7, TTF-1 and NapsinA are negative. The morphology and immunoprofile favor a colorectal primary over pancreaticobiliary. [name] has reviewed the case. [name] was notified on 10/31/2017.    11/04/2017 Procedure    Colonoscopy impression - Seton in place found on digital rectal exam. - Congested mucosa in the rectum, in the sigmoid colon and in the descending colon. - A few diminutive polyps in the rectum and in the sigmoid colon. No colon or rectal mass identified. - No specimens collected.    02/19/2018 Imaging    CT CAP IMPRESSION: 1. Slight interval increase in size of left adrenal gland metastasis. Similar-appearing right adrenal gland metastasis. 2. While difficult to measure given ill-defined nature, infiltrative mass within the left hepatic lobe is  grossly similar  when compared to prior exam. Additional lesions within the liver are grossly similar. 3. Interval insertion of biliary drain within the left hepatic lobe with resultant decompression of the left hepatic lobe biliary system. 4. Similar-appearing sharply marginated consolidation within the right lower lobe which may represent infectious/inflammatory process or metastatic disease. 5. Large amount of stool throughout the colon as can be seen with constipation. 6. Persistent left hepatic lobe portal venous thrombosis. 7. Splenomegaly. 8. New moderate volume ascites.     Intrahepatic cholangiocarcinoma ([place])   10/27/2017 Imaging    10/27/2017 CT AP IMPRESSION: Changes of cirrhosis. Multiple hepatic lesions are noted concerning for multifocal hepatoma or metastases.  Biliary drain is in place with the tip in the distal common bile duct. There is mild intrahepatic biliary ductal dilatation.  Bilateral adrenal nodules and masses, right larger than left compatible with metastases.  Retroperitoneal adenopathy.  Large stool burden with mild gaseous distention of the colon.  Aortic atherosclerosis.    10/29/2017 Cancer Staging    Staging form: Intrahepatic Bile Duct, AJCC 8th Edition - Clinical stage from 10/29/2017: Stage IV (cT2, cN1, pM1) - Signed by [name], MD on 11/06/2017    10/29/2017 Pathology Results    10/29/2017 Surgical Pathology Diagnosis Soft Tissue Needle Core Biopsy, right adrenal mass - METASTATIC ADENOCARCINOMA, SEE COMMENT.    11/06/2017 Initial Diagnosis    Intrahepatic cholangiocarcinoma ([place])    11/06/2017 Imaging    11/06/2017 CT AP IMPRESSION: 1. Increase in RIGHT pleural effusion. Stable masslike consolidation at the RIGHT lung base representing neoplasm versus pneumonia. 2. Stable metastatic nodule at the RIGHT lung base. 3. Infiltrative mass in the central LEFT hepatic lobe and discrete lesions in the RIGHT hepatic lobe  consistent with metastatic disease versus primary hepatic biliary carcinoma. Favor cholangiocarcinoma. 4. Percutaneous drainage catheter extends the porta hepatis. Small trapped pneumothorax along the tract of the catheter at the inferolateral RIGHT lung base. 5. Stable adrenal metastasis. 6. Metastatic adenopathy in the retroperitoneum unchanged. 7. Morphologic changes in liver consistent with  cirrhosis.    11/08/2017 Imaging    11/08/2017 CT AP IMPRESSION: 1. Overall no significant interval change since the CT of 11/06/2017. Stable positioning of the percutaneous biliary drain with similar degree of biliary ductal dilatation. 2. Cirrhosis with multiple hepatic metastatic disease. Infiltrative appearing lesion in the left lobe of the liver appears to extend into the left portal vein and may represent a tumor thrombus, or bland thrombus. A primary biliary neoplasm (cholangiocarcinoma, or metastatic disease are other considerations. 3. Retroperitoneal, mesenteric, and adrenal metastatic disease. 4. Hepatic colopathy versus less likely segmental colitis of the hepatic flexure. 5. Small right pleural effusion and bilateral lung base pulmonary masses/nodules as seen previously. 6. Tiny pockets of pneumoperitoneum lateral to the liver along the percutaneous drain, decreased since the prior CT. 7. Other findings as above.    11/20/2017 - 11/24/2017 Hospital Admission    Admit date: 11/08/2017 Admission diagnosis: fatigue Additional comments: discharged 11/24/2017    12/18/2017 -  Chemotherapy    first line chemo gemcitabine/cisplatin on days 1 and 15 every 28 days starting 12/18/17    12/22/2017 - 12/30/2017 Hospital Admission    Admit date: 12/22/2017 Admission diagnosis: Dysuria    12/22/2017 Imaging    12/22/2017 CT AP IMPRESSION: 1. Prominently distended and otherwise normal urinary bladder with mild prostatomegaly. Findings suggest acute bladder outlet obstruction. No  hydronephrosis. 2. Large colorectal stool volume compatible with constipation. New mild circumferential rectal wall thickening and perirectal fat haziness suggesting  stercoral colitis. No free air. No abscess. 3. Stable infiltrative cholangiocarcinoma in the central left liver lobe with associated peripheral intrahepatic biliary ductal dilatation throughout the left liver lobe. 4. Stable well-positioned internal external drain traversing the right liver lobe and decompressing the right liver biliary system. 5. Stable liver, bilateral adrenal, retroperitoneal, left mesenteric and right infrahilar nodal and left lung base metastases. No progressive metastatic disease since 12/03/2017 CT. 6. Cirrhosis. Trace perihepatic ascites, decreased. Stable moderate splenomegaly. 7.  Aortic Atherosclerosis (ICD10-I70.0).    03/11/2018 -  Chemotherapy    The patient had palonosetron (ALOXI) injection 0.25 mg, 0.25 mg, Intravenous,  Once, 3 of 4 cycles Administration: 0.25 mg (03/26/2018), 0.25 mg (04/09/2018), 0.25 mg (04/23/2018) pegfilgrastim (NEULASTA) injection 6 mg, 6 mg, Subcutaneous, Once, 3 of 4 cycles Administration: 6 mg (04/11/2018), 6 mg (04/25/2018) leucovorin 840 mg in sodium chloride 0.9 % 250 mL infusion, 400 mg/m2 = 840 mg, Intravenous,  Once, 4 of 5 cycles Administration: 840 mg (03/11/2018), 840 mg (03/26/2018), 840 mg (04/09/2018), 840 mg (04/23/2018) oxaliplatin (ELOXATIN) 150 mg in dextrose 5 % 500 mL chemo infusion, 71 mg/m2 = 145 mg (100 % of original dose 70 mg/m2), Intravenous,  Once, 2 of 3 cycles Dose modification: 70 mg/m2 (original dose 70 mg/m2, Cycle 2, Reason: Provider Judgment, Comment: thrombocytopenia ) Administration: 150 mg (03/26/2018), 150 mg (04/09/2018) fluorouracil (ADRUCIL) 4,200 mg in sodium chloride 0.9 % 66 mL chemo infusion, 2,000 mg/m2 = 4,200 mg (100 % of original dose 2,000 mg/m2), Intravenous, 1 Day/Dose, 4 of 5 cycles Dose modification: 2,000 mg/m2 (original dose 2,000  mg/m2, Cycle 1, Reason: Provider Judgment) Administration: 4,200 mg (03/11/2018), 5,000 mg (03/26/2018), 5,000 mg (04/09/2018), 5,000 mg (04/23/2018)  for chemotherapy treatment.      CURRENT THERAPY: Second lineFOLFOX every 2 weeks started 03/11/2018; received 5FU and leucovorin with cycle 1, oxaliplatin added with cycle 2; s/p cycle 4 on 04/23/2018  INTERVAL HISTORY: Mr. Moder returns for toxicity check following cycle 4 on 04/23/18; he received leucovorin and 5FU. He feels "pretty well" today. Denies nausea, vomiting, or diarrhea with last chemo. Appetite is fair. Drinks 2 ensure per day. He is drinking over a liter per day. Denies mouth sores. Energy level is fair, he performs ADLs with effort. His wife and son help when they can. He uses a cane at home. He has learned to stand and ambulate slowly. He has not had a fall in a couple weeks. Still has some dizziness if he stands too quickly. He has decreased allergy medication to BID.   He was seen in IR yesterday 04/28/18 and underwent right biliary drain exchange and upsize to 12 Fr and left biliary drain exchange for 10 Fr. Procedure was successful per patient as drainage tubes are patent. Right site is mildly tender intermittently, now 3/10. He can't inspect the site to see if it is red. Still leaking slightly, but not saturating clothes, and much better since it was exchanged yesterday. Drains 1.5 liters non-bloody bilious drainage per day from R and 1 liter per day from L; he empties every hour or so. Denies abdominal pain.    Otherwise, he denies cough, dyspnea, fever, chest pain, leg pain or swelling. Denies blood in stool. His chronic diabetic neuropathy is stable.   REVIEW OF SYSTEMS:   Constitutional: Denies fevers, chills (+) weight loss (+) low appetite (+) fatigue  Ears, nose, mouth, throat, and face: Denies mucositis  Respiratory: Denies cough, dyspnea or wheezes (+) allergies  Cardiovascular: Denies palpitation, chest discomfort  or lower  extremity swelling Gastrointestinal:  Denies nausea, vomiting, constipation, diarrhea, GI bleeding, abdominal pain (+) tunneled biliary drain site x2, right site is mildly tender and draining  Skin: Denies abnormal skin rashes Neurological: (+) occasional dizziness on standing (+) chronic stable neuropathy (+) uses cane  Behavioral/Psych: Mood is stable, no new changes  All other systems were reviewed with the patient and are negative.  MEDICAL HISTORY:  Past Medical History:  Diagnosis Date   Abscess of anal and rectal regions    horse shoe abscess   Alcoholic cirrhosis ([place])    Arthritis    "everywhere; especially in my spine"   Cirrhosis ([place])    STAGE 1 CIRRHOSIS PATIENT SEES DR NAMDIGAM FOR   Colon cancer ([place]) 05/2005   TX SURGERY WITH LYMPH NODE REMOVAL   Cough    LAST FEW WEEKS SAW DR RAMOS LUNGS MILKY SPUTUM [place]   DM type 2 with diabetic peripheral neuropathy ([place])    left foot   Fatigue    Fistula, anal    multiple   History of substance abuse ([place])    last alcohol was 08/2011; last marijuania was 08/2011   Hypertension    PTSD (post-traumatic stress disorder)    "severe" HX OF   Sleep deprivation     SURGICAL HISTORY: Past Surgical History:  Procedure Laterality Date   ABSCESS DRAINAGE     "probably 15-20 so far at least; rectal"   ANAL FISTULECTOMY  12/13/1998   ANTERIOR FUSION CERVICAL SPINE  2010   "triple"; "did 2 surgeries on the same day in 1610 due to complications"   CORONARY ANGIOPLASTY  2001   detached muscle  2010   right chest; "after cervical fusion complications"   ELBOW SURGERY  2007   "cut out part of a muscle"; left   ESOPHAGEAL BANDING  02/25/2018   Procedure: ESOPHAGEAL BANDING;  Surgeon: Ladene Artist, MD;  Location: [facility];  Service: Endoscopy;;   ESOPHAGOGASTRODUODENOSCOPY (EGD) WITH PROPOFOL N/A 02/25/2018   Procedure: ESOPHAGOGASTRODUODENOSCOPY (EGD) WITH PROPOFOL;  Surgeon: Ladene Artist, MD;  Location:  [facility] ENDOSCOPY;  Service: Endoscopy;  Laterality: N/A;   EXAMINATION UNDER ANESTHESIA  03/22/2005   fistula   EXAMINATION UNDER ANESTHESIA  06/06/2011   Procedure: EXAM UNDER ANESTHESIA;  Surgeon: Stark Klein, MD;  Location: [facility];  Service: General;  Laterality: N/A;   HEMICOLECTOMY  06/06/2005   left   INCISION AND DRAINAGE PERIRECTAL ABSCESS  03/30/2005   INCISION AND DRAINAGE PERIRECTAL ABSCESS  12/13/2004   INCISION AND DRAINAGE PERIRECTAL ABSCESS  07/14/2001   INCISION AND DRAINAGE PERIRECTAL ABSCESS  08/12/2010   horseshoe abscess; Dr Redmond Pulling   INCISION AND DRAINAGE PERIRECTAL ABSCESS  06/06/2011   Procedure: IRRIGATION AND DEBRIDEMENT PERIRECTAL ABSCESS;  Surgeon: Stark Klein, MD;  Location: [facility];  Service: General;  Laterality: N/A;   INCISION AND DRAINAGE PERIRECTAL ABSCESS N/A 02/24/2017   Procedure: IRRIGATION AND DEBRIDEMENT PERIRECTAL ABSCESS;  Surgeon: Ralene Ok, MD;  Location: [place];  Service: General;  Laterality: N/A;   IR CATHETER TUBE CHANGE  02/19/2018   IR CHOLANGIOGRAM EXISTING TUBE  12/23/2017   IR CHOLANGIOGRAM EXISTING TUBE  01/02/2018   IR EXCHANGE BILIARY DRAIN  11/07/2017   IR EXCHANGE BILIARY DRAIN  12/09/2017   IR EXCHANGE BILIARY DRAIN  02/03/2018   IR EXCHANGE BILIARY DRAIN  02/03/2018   IR EXCHANGE BILIARY DRAIN  04/03/2018   IR EXCHANGE BILIARY DRAIN  04/03/2018   IR EXCHANGE BILIARY DRAIN  04/23/2018  IR EXCHANGE BILIARY DRAIN  04/28/2018   IR EXCHANGE BILIARY DRAIN  04/28/2018   IR FLUORO RM 30-60 MIN  04/15/2018   IR IMAGING GUIDED PORT INSERTION  12/09/2017   IR INT EXT BILIARY DRAIN WITH CHOLANGIOGRAM  10/26/2017   IR INT EXT BILIARY DRAIN WITH CHOLANGIOGRAM  12/23/2017   IR PARACENTESIS  02/25/2018   IR PATIENT EVAL TECH 0-60 MINS  12/02/2017   IR RADIOLOGIST EVAL & MGMT  11/06/2017   IR RADIOLOGIST EVAL & MGMT  01/05/2018   LUMBAR DISC SURGERY  1987   PERCUTANEOUS PINNING PHALANX FRACTURE OF HAND  ~ 2008   "plates in 2  places"; right   PLACEMENT OF SETON N/A 09/05/2017   Procedure: PLACEMENT OF SETON;  Surgeon: Leighton Ruff, MD;  Location[facility];  Service: General;  Laterality: N/A;   RADIOLOGY WITH ANESTHESIA N/A 10/25/2017   Procedure: MRI WITH ANESTHESIA;  Surgeon: Radiologist, Medication, MD;  Location: [facility];  Service: Radiology;  Laterality: N/A;   THORACIC DISCECTOMY  1990's    I have reviewed the social history and family history with the patient and they are unchanged from previous note.  ALLERGIES:  is allergic to morphine and related.  MEDICATIONS:  Current Outpatient Medications  Medication Sig Dispense Refill   Ca Carbonate-Mag Hydroxide (ROLAIDS PO) Take 1-2 tablets by mouth 2 (two) times daily as needed (heartburn).     fexofenadine (ALLEGRA) 180 MG tablet Take 180 mg by mouth at bedtime.     gabapentin (NEURONTIN) 400 MG capsule Take 400-800 mg by mouth See admin instructions. Take 400 mg by mouth in the morning and 800 mg in the evening     insulin detemir (LEVEMIR) 100 UNIT/ML injection Inject 10-62 Units into the skin See admin instructions. Take 62 in the am and 10 in the pm     lactose free nutrition (BOOST PLUS) LIQD Take 237 mLs by mouth 3 (three) times daily with meals. (Patient taking differently: Take 237 mLs by mouth 3 (three) times daily with meals. Ensure chocolate and vanilla max protein) 30 Can 0   levocetirizine (XYZAL) 5 MG tablet Take 5 mg by mouth every evening.     lidocaine-prilocaine (EMLA) cream Apply to port site 1 hour prior to access (Patient taking differently: Apply 1 application topically once. Apply to port site 1 hour prior to access) 30 g 0   Multiple Vitamin (MULTIVITAMIN) tablet Take 1 tablet by mouth daily.     pantoprazole (PROTONIX) 40 MG tablet Take 1 tablet (40 mg total) by mouth 2 (two) times daily. 60 tablet 0   Sodium Chloride Flush (NORMAL SALINE FLUSH) 0.9 % SOLN Inject 10 mLs into the vein daily. 30 Syringe 2    Sodium Chloride Flush (NORMAL SALINE FLUSH) 0.9 % SOLN Inject 10 mLs into the vein daily. 30 Syringe 2   tamsulosin (FLOMAX) 0.4 MG CAPS capsule TAKE 1 CAPSULE(0.4 MG) BY MOUTH DAILY AFTER SUPPER 90 capsule 0   propranolol ER (INDERAL Matthew) 60 MG 24 hr capsule Take 1 capsule (60 mg total) by mouth daily for 30 days. 30 capsule 0   Current Facility-Administered Medications  Medication Dose Route Frequency Provider Last Rate Last Dose   0.9 %  sodium chloride infusion   Intravenous Once Alla Feeling, NP 500 mL/hr at 04/29/18 1018     Facility-Administered Medications Ordered in Other Visits  Medication Dose Route Frequency Provider Last Rate Last Dose   0.9 %  sodium chloride infusion  Intravenous Once Alla Feeling, NP        PHYSICAL EXAMINATION: ECOG PERFORMANCE STATUS: 3 - Symptomatic, >50% confined to bed  Vitals:   04/29/18 0900 04/29/18 0907  BP: 121/82 90/63  Pulse: (!) 110   Resp: 18   Temp: 98 F (36.7 C)   SpO2: 100%    Filed Weights   04/29/18 0900  Weight: 181 lb (82.1 kg)    GENERAL:alert, no distress and comfortable SKIN: no rashes  EYES:  anicteric  OROPHARYNX: no thrush or ulcers  LUNGS: clear anteriorally with normal breathing effort HEART: tachycardic, no murmurs; no lower extremity edema ABDOMEN: abdomen soft, non-tender and normal bowel sounds; tunneled biliary drains x2; dressings c/d/i; right site slightly pink with minimal drainage, left site without drainage or erythema. Dressings clean and intact Musculoskeletal:no cyanosis of digits  NEURO: alert & oriented x 3 with fluent speech, generalized weakness  LABORATORY DATA:  I have reviewed the data as listed CBC Latest Ref Rng & Units 04/29/2018 04/28/2018 04/23/2018  WBC 4.0 - 10.5 K/uL 21.7(H) 26.4(H) 13.3(H)  Hemoglobin 13.0 - 17.0 g/dL 9.9(L) 9.5(L) 10.3(L)  Hematocrit 39.0 - 52.0 % 29.6(L) 29.5(L) 31.6(L)  Platelets 150 - 400 K/uL 143(L) 161 141(L)     CMP Latest Ref Rng & Units  04/29/2018 04/23/2018 04/09/2018  Glucose 70 - 99 mg/dL 220(H) 196(H) 204(H)  BUN 8 - 23 mg/dL 12 14 14   Creatinine 0.61 - 1.24 mg/dL 0.77 0.74 0.80  Sodium 135 - 145 mmol/L 131(L) 127(L) 133(L)  Potassium 3.5 - 5.1 mmol/L 3.7 3.9 3.9  Chloride 98 - 111 mmol/L 99 100 102  CO2 22 - 32 mmol/L 23 19(L) 22  Calcium 8.9 - 10.3 mg/dL 8.9 8.9 9.3  Total Protein 6.5 - 8.1 g/dL 7.9 7.9 8.5(H)  Total Bilirubin 0.3 - 1.2 mg/dL 1.1 1.4(H) 1.1  Alkaline Phos 38 - 126 U/L 413(H) 241(H) 346(H)  AST 15 - 41 U/L 39 42(H) 31  ALT 0 - 44 U/L 35 46(H) 24      RADIOGRAPHIC STUDIES: I have personally reviewed the radiological images as listed and agreed with the findings in the report. Ir Exchange Biliary Drain  Result Date: 04/28/2018 INDICATION: History of cholangiocarcinoma with prior percutaneous biliary drainage of both right-sided and left-sided intrahepatic bile ducts for biliary obstruction with placement of right-sided 10 French internal/external biliary drainage catheter and left-sided 10 French internal/external biliary drainage catheter. The right-sided catheter was exchanged on 04/23/2018 due to partial retraction and bile leak around the exit site of the catheter. There remains bile leak around this catheter as well as pain at the exit site felt to be related to the retention suture. The left-sided biliary drain also has now stopped draining and flushing of this catheter results in saline exiting the skin at the catheter entrance. EXAM: 1. EXCHANGE AND UP SIZING OF RIGHT-SIDED PERCUTANEOUS INTERNAL/EXTERNAL BILIARY DRAINAGE CATHETER 2. EXCHANGE OF LEFT-SIDED PERCUTANEOUS INTERNAL/EXTERNAL BILIARY DRAINAGE CATHETER MEDICATIONS: 2 g IV Ancef; The antibiotic was administered within an appropriate time frame prior to the initiation of the procedure. ANESTHESIA/SEDATION: Moderate (conscious) sedation was employed during this procedure. A total of Versed 0.5 mg and Fentanyl 50 mcg was administered intravenously.  Moderate Sedation Time: 25 minutes. The patient's level of consciousness and vital signs were monitored continuously by radiology nursing throughout the procedure under my direct supervision. CONTRAST:  20 mL Omnipaque 300 FLUOROSCOPY TIME:  Fluoroscopy Time: 3 minutes and 18 seconds. 62.0 mGy. COMPLICATIONS: None immediate. PROCEDURE: Informed written consent  was obtained from the patient after a thorough discussion of the procedural risks, benefits and alternatives. All questions were addressed. Maximal Sterile Barrier Technique was utilized including caps, mask, sterile gowns, sterile gloves, sterile drape, hand hygiene and skin antiseptic. A timeout was performed prior to the initiation of the procedure. The left hepatic internal/external biliary drainage catheter was attempted to be injected with contrast material. The catheter was then cut and removed over a guidewire. A 5 [place] the catheter was advanced over the wire. This catheter was further advanced into the left intrahepatic biliary tree under fluoroscopy and ultimately through the common bile duct and into the duodenum over a guidewire. A new 10 French internal/external biliary drainage catheter was advanced over the wire and formed in the duodenum. Positioning within the left lobe of the liver was confirmed with a fluoroscopic spot image obtained after injection of contrast. The catheter was flushed with saline and connected to a new gravity drainage bag. The pre-existing 10 [place] internal/external biliary drainage catheter in the right lobe of the liver was then injected with contrast material, cut and removed over a guidewire. A new 12 French internal/external biliary drainage catheter was then advanced and formed in the duodenum. Catheter position was confirmed by a fluoroscopic spot image obtained after injection of contrast. The catheter was flushed with saline and connected to a new gravity drainage bag. Both catheters were secured at the  skin with StatLock devices. FINDINGS: The pre-existing left-sided biliary drainage catheter has migrated nearly completely out of the left lobe of the liver. The tube could also not be flushed. Access through the left-sided bile ducts was able to be gained with a catheter allowing replacement of the left-sided biliary drain. A new 10 French drain was formed in the duodenum with sideholes extending up into the left lobe of the liver. Due to persistent leakage of bile around the exit site of the pre-existing 10 [place] right-sided biliary drain despite recent exchange, decision was made to upsize the tube to 12 [place] today. A new 12 [place] biliary drainage catheter was advanced and formed in the duodenum with sideholes extending up into the right lobe of the liver. Due to skin irritation from previously placed retention sutures, the patient requested that retention sutures not be placed today. IMPRESSION: 1. Exchange of retracted left-sided internal/external biliary drainage catheter which had retracted nearly outside of the liver. A new 10 French catheter was advanced and formed in the duodenum. 2. Exchange and up sizing of right-sided biliary drainage catheter with new 12 French internal/external biliary drainage catheter. 3. The patient will be scheduled for elective exchange of both biliary drains in approximately 10 weeks. Electronically Signed   By: Aletta Edouard M.D.   On: 04/28/2018 11:21   Ir Exchange Biliary Drain  Result Date: 04/28/2018 INDICATION: History of cholangiocarcinoma with prior percutaneous biliary drainage of both right-sided and left-sided intrahepatic bile ducts for biliary obstruction with placement of right-sided 10 French internal/external biliary drainage catheter and left-sided 10 French internal/external biliary drainage catheter. The right-sided catheter was exchanged on 04/23/2018 due to partial retraction and bile leak around the exit site of the catheter. There remains bile  leak around this catheter as well as pain at the exit site felt to be related to the retention suture. The left-sided biliary drain also has now stopped draining and flushing of this catheter results in saline exiting the skin at the catheter entrance. EXAM: 1. EXCHANGE AND UP SIZING OF RIGHT-SIDED PERCUTANEOUS INTERNAL/EXTERNAL BILIARY DRAINAGE  CATHETER 2. EXCHANGE OF LEFT-SIDED PERCUTANEOUS INTERNAL/EXTERNAL BILIARY DRAINAGE CATHETER MEDICATIONS: 2 g IV Ancef; The antibiotic was administered within an appropriate time frame prior to the initiation of the procedure. ANESTHESIA/SEDATION: Moderate (conscious) sedation was employed during this procedure. A total of Versed 0.5 mg and Fentanyl 50 mcg was administered intravenously. Moderate Sedation Time: 25 minutes. The patient's level of consciousness and vital signs were monitored continuously by radiology nursing throughout the procedure under my direct supervision. CONTRAST:  20 mL Omnipaque 300 FLUOROSCOPY TIME:  Fluoroscopy Time: 3 minutes and 18 seconds. 62.0 mGy. COMPLICATIONS: None immediate. PROCEDURE: Informed written consent was obtained from the patient after a thorough discussion of the procedural risks, benefits and alternatives. All questions were addressed. Maximal Sterile Barrier Technique was utilized including caps, mask, sterile gowns, sterile gloves, sterile drape, hand hygiene and skin antiseptic. A timeout was performed prior to the initiation of the procedure. The left hepatic internal/external biliary drainage catheter was attempted to be injected with contrast material. The catheter was then cut and removed over a guidewire. A 5 [place] the catheter was advanced over the wire. This catheter was further advanced into the left intrahepatic biliary tree under fluoroscopy and ultimately through the common bile duct and into the duodenum over a guidewire. A new 10 French internal/external biliary drainage catheter was advanced over the wire  and formed in the duodenum. Positioning within the left lobe of the liver was confirmed with a fluoroscopic spot image obtained after injection of contrast. The catheter was flushed with saline and connected to a new gravity drainage bag. The pre-existing 10 [place] internal/external biliary drainage catheter in the right lobe of the liver was then injected with contrast material, cut and removed over a guidewire. A new 12 French internal/external biliary drainage catheter was then advanced and formed in the duodenum. Catheter position was confirmed by a fluoroscopic spot image obtained after injection of contrast. The catheter was flushed with saline and connected to a new gravity drainage bag. Both catheters were secured at the skin with StatLock devices. FINDINGS: The pre-existing left-sided biliary drainage catheter has migrated nearly completely out of the left lobe of the liver. The tube could also not be flushed. Access through the left-sided bile ducts was able to be gained with a catheter allowing replacement of the left-sided biliary drain. A new 10 French drain was formed in the duodenum with sideholes extending up into the left lobe of the liver. Due to persistent leakage of bile around the exit site of the pre-existing 10 [place] right-sided biliary drain despite recent exchange, decision was made to upsize the tube to 12 [place] today. A new 12 [place] biliary drainage catheter was advanced and formed in the duodenum with sideholes extending up into the right lobe of the liver. Due to skin irritation from previously placed retention sutures, the patient requested that retention sutures not be placed today. IMPRESSION: 1. Exchange of retracted left-sided internal/external biliary drainage catheter which had retracted nearly outside of the liver. A new 10 French catheter was advanced and formed in the duodenum. 2. Exchange and up sizing of right-sided biliary drainage catheter with new 12 French  internal/external biliary drainage catheter. 3. The patient will be scheduled for elective exchange of both biliary drains in approximately 10 weeks. Electronically Signed   By: Aletta Edouard M.D.   On: 04/28/2018 11:21     ASSESSMENT & PLAN:  Matthew Lewis is a 63 y.o. male with   1. 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 Systemic chemotherapy delayed due to hospitalization, infection, dehydration, etc. Restaging CT showed disease progression and he ultimately began first line cisplatin/gemcitabine in 02/2018; has had multiple hospitalization for infection, GI bleeding.  -He began second line FOLFOX on 03/11/2018. Oxaliplatin was held on cycles 1 and 4 for biliary drain leakage and hypotension.  -He completed cycle 4 5FU/leucovorin on 04/23/18; tolerated moderately well. Denies n/v/d after chemo -he continues to have orthostatic hypotension secondary to dehydration from high drain output. He is drinking 1 liter per day. I encouraged him to drink more than his drain puts out.  -Drains were exchanged per IR on 04/28/18. He tolerated well. No signs of infection. We reviewed what to look for.  -Labs reviewed, WBC 21; this is chronically elevated. No fever. CBC and CMP otherwise stable -VS indicative of hypotension, will get IVF today and x2 this week  -F/u with [name] next week with cycle 5 chemotherapy. I reviewed the plan with her today.   2.Obstructive jaundice, status post bilateral percutaneous biliary drain -s/p multiple drain exchange per IR, most recently right tube exchanged to 12 Fr and left with 10 Fr on 04/28/18. He tolerated procedure well. Output approaches 2.5 liters per day per patient.  -high output likely contributes to dehydration and orthostatic hypotension. Will continue to supplement with IVF and encouraged po hydration.  -he understands the importance of  adequate hydration -we reviewed s/sx of infection at the time site, including fever, redness, increased pain, or purulent drainage appearance on the dressing.  -I instructed him to inspect site periodically with a mirror and call with concerning symptoms above, he agrees.   3. Dizziness with falls, orthostatic hypotension  -BP 120/82 sitting, 90/63 standing -dizziness is improving, he stands slowly and ambulates with a cane -no fall in a few weeks per patient -will get 1 liter IVF over 2 hours today, plan to get additional IVF on 4/24 and 4/25 due to high drain output  4.Liver cirrhosis secondary to alcohol and NASH -Stable  5. Portal vein thrombosis on aspirin, no anticoagulation due toinitialbloody biliary drainage  6. DM, HTN -f/u with PCP  7.H/o substance abuse and addiction; clean 6 years   8. Anemia  -Secondary to chemotherapy, recent GI bleedingand underlying malignancy -hgb 9.9 today, stable   9. GIbleeding fromduodenal ulcers -Continue Protonix twice daily indefinitelyper GI Dr. Fuller Plan   10.Goal of care discussion, DNR/DNI  11. Lower appetite, weight loss  -drinks 2 ensure per day, weight fairly stable this month    12. Seasonal Allergies   Plan -labs, VS reviewed -1 liter NS over 2 hours today, 4/24 with lab, and 4/25 -f/u 4/30 with [name] with next cycle chemotherapy -monitor drain sites, call with s/sx of infection which I reviewed with patient -increase po hydration  All questions were answered. The patient knows to call the clinic with any problems, questions or concerns. No barriers to learning was detected.     Alla Feeling, NP 04/29/18

## 2018-04-28 ENCOUNTER — Other Ambulatory Visit: Payer: Self-pay

## 2018-04-28 ENCOUNTER — Other Ambulatory Visit (HOSPITAL_COMMUNITY): Payer: Self-pay | Admitting: Interventional Radiology

## 2018-04-28 ENCOUNTER — Other Ambulatory Visit (HOSPITAL_COMMUNITY): Payer: Self-pay | Admitting: Diagnostic Radiology

## 2018-04-28 ENCOUNTER — Ambulatory Visit (HOSPITAL_COMMUNITY)
Admission: RE | Admit: 2018-04-28 | Discharge: 2018-04-28 | Disposition: A | Discharge status: Home / Self Care | Payer: Medicare Other | Source: Ambulatory Visit | Attending: Diagnostic Radiology | Admitting: Diagnostic Radiology

## 2018-04-28 ENCOUNTER — Encounter (HOSPITAL_COMMUNITY): Payer: Self-pay

## 2018-04-28 DIAGNOSIS — Z85038 Personal history of other malignant neoplasm of large intestine: Secondary | ICD-10-CM | POA: Diagnosis not present

## 2018-04-28 DIAGNOSIS — M1990 Unspecified osteoarthritis, unspecified site: Secondary | ICD-10-CM | POA: Diagnosis not present

## 2018-04-28 DIAGNOSIS — Z87891 Personal history of nicotine dependence: Secondary | ICD-10-CM | POA: Insufficient documentation

## 2018-04-28 DIAGNOSIS — Z4803 Encounter for change or removal of drains: Secondary | ICD-10-CM | POA: Insufficient documentation

## 2018-04-28 DIAGNOSIS — K831 Obstruction of bile duct: Secondary | ICD-10-CM

## 2018-04-28 DIAGNOSIS — F4310 Post-traumatic stress disorder, unspecified: Secondary | ICD-10-CM | POA: Insufficient documentation

## 2018-04-28 DIAGNOSIS — C221 Intrahepatic bile duct carcinoma: Secondary | ICD-10-CM | POA: Diagnosis not present

## 2018-04-28 DIAGNOSIS — E1140 Type 2 diabetes mellitus with diabetic neuropathy, unspecified: Secondary | ICD-10-CM | POA: Insufficient documentation

## 2018-04-28 DIAGNOSIS — Z79899 Other long term (current) drug therapy: Secondary | ICD-10-CM | POA: Insufficient documentation

## 2018-04-28 DIAGNOSIS — Z794 Long term (current) use of insulin: Secondary | ICD-10-CM | POA: Diagnosis not present

## 2018-04-28 DIAGNOSIS — Z4682 Encounter for fitting and adjustment of non-vascular catheter: Secondary | ICD-10-CM | POA: Diagnosis not present

## 2018-04-28 DIAGNOSIS — K7030 Alcoholic cirrhosis of liver without ascites: Secondary | ICD-10-CM | POA: Diagnosis not present

## 2018-04-28 DIAGNOSIS — I10 Essential (primary) hypertension: Secondary | ICD-10-CM | POA: Insufficient documentation

## 2018-04-28 DIAGNOSIS — Z8509 Personal history of malignant neoplasm of other digestive organs: Secondary | ICD-10-CM | POA: Diagnosis not present

## 2018-04-28 HISTORY — PX: IR EXCHANGE BILIARY DRAIN: IMG6046

## 2018-04-28 LAB — CBC
HCT: 29.5 % — ABNORMAL LOW (ref 39.0–52.0)
Hemoglobin: 9.5 g/dL — ABNORMAL LOW (ref 13.0–17.0)
MCH: 31 pg (ref 26.0–34.0)
MCHC: 32.2 g/dL (ref 30.0–36.0)
MCV: 96.4 fL (ref 80.0–100.0)
Platelets: 161 10*3/uL (ref 150–400)
RBC: 3.06 MIL/uL — ABNORMAL LOW (ref 4.22–5.81)
RDW: 17.9 % — ABNORMAL HIGH (ref 11.5–15.5)
WBC: 26.4 10*3/uL — ABNORMAL HIGH (ref 4.0–10.5)
nRBC: 0 % (ref 0.0–0.2)

## 2018-04-28 LAB — PROTIME-INR
INR: 1.2 (ref 0.8–1.2)
Prothrombin Time: 15.2 seconds (ref 11.4–15.2)

## 2018-04-28 LAB — GLUCOSE, CAPILLARY: Glucose-Capillary: 150 mg/dL — ABNORMAL HIGH (ref 70–99)

## 2018-04-28 MED ORDER — IOHEXOL 300 MG/ML  SOLN
50.0000 mL | Freq: Once | INTRAMUSCULAR | Status: AC | PRN
  Administered 2018-04-28: 10:00:00 20 mL via INTRAVENOUS

## 2018-04-28 MED ORDER — HEPARIN SOD (PORK) LOCK FLUSH 100 UNIT/ML IV SOLN
500.0000 [IU] | INTRAVENOUS | Status: AC | PRN
  Administered 2018-04-28: 10:00:00 500 [IU]

## 2018-04-28 MED ORDER — MIDAZOLAM HCL 2 MG/2ML IJ SOLN
INTRAMUSCULAR | Status: AC | PRN
  Administered 2018-04-28: 0.5 mg via INTRAVENOUS

## 2018-04-28 MED ORDER — FENTANYL CITRATE (PF) 100 MCG/2ML IJ SOLN
INTRAMUSCULAR | Status: AC | PRN
  Administered 2018-04-28: 50 ug via INTRAVENOUS
  Administered 2018-04-28: 25 ug via INTRAVENOUS

## 2018-04-28 MED ORDER — LIDOCAINE HCL (PF) 1 % IJ SOLN
INTRAMUSCULAR | Status: AC | PRN
  Administered 2018-04-28: 10 mL

## 2018-04-28 MED ORDER — FENTANYL CITRATE (PF) 100 MCG/2ML IJ SOLN
INTRAMUSCULAR | Status: AC
  Filled 2018-04-28: qty 2

## 2018-04-28 MED ORDER — MIDAZOLAM HCL 2 MG/2ML IJ SOLN
INTRAMUSCULAR | Status: AC
  Filled 2018-04-28: qty 2

## 2018-04-28 MED ORDER — CEFAZOLIN SODIUM-DEXTROSE 2-4 GM/100ML-% IV SOLN
INTRAVENOUS | Status: AC
  Filled 2018-04-28: qty 100

## 2018-04-28 MED ORDER — LIDOCAINE HCL 1 % IJ SOLN
INTRAMUSCULAR | Status: AC
  Filled 2018-04-28: qty 20

## 2018-04-28 MED ORDER — SODIUM CHLORIDE 0.9 % IV SOLN: INTRAVENOUS | Status: DC

## 2018-04-28 MED ORDER — CEFAZOLIN SODIUM-DEXTROSE 2-4 GM/100ML-% IV SOLN
2.0000 g | INTRAVENOUS | Status: AC
  Administered 2018-04-28: 09:00:00 2 g via INTRAVENOUS

## 2018-04-28 NOTE — Discharge Instructions (Addendum)
Biliary Drainage Catheter Home Guide A biliary drainage catheter is a thin, flexible tube that is inserted through your skin into the bile ducts in your liver. Bile is a thick yellow or green fluid that helps digest fat in foods. The purpose of a biliary drainage catheter is to keep bile from backing up into your liver. Backup of bile can occur when there is a blockage that prevents bile from moving from the bile ducts into the small intestine as it should. The blockage can be caused by gallstones, a tumor, or scar tissue. There are three types of biliary drainage:  External biliary drainage. With this type, bile is only drained into a collection bag outside your body (external collection bag).  Internal-external biliary drainage. With this type, bile is drained to an external collection bag as well as into your small intestine.  Internal biliary drainage. With this type, bile is only drained into your small intestine. General home care includes these daily actions:   Inspection of your drainage catheter.  Flushing your drainage catheter with saline.  Emptying drainage from the collection bag (if present).  Recording the amount of drainage.  Checking the catheter insertion site for signs of infection. Check for: ? Redness, swelling, or pain. ? Fluid or blood. ? Warmth. ? Pus or a bad smell. How do I inspect my drainage catheter?  Check the dressing to make sure that it is dry and clean.  Look at the skin around the drainage catheter when changing the dressing for any problems such as redness, rash, or skin breakdown.  Check the drainage bag to make sure that drainage fluid is flowing into the bag well. Note the color and amount compared to other days.  Check the drainage catheter and bag for any cracks or kinks in the tubing. How do I change my dressing? The dressing over the drainage catheter should be changed every other day, or more often if needed to keep the dressing dry. Your  health care provider will instruct you about how often to change your dressing. Supplies needed:  Mild soap and warm water.  Split gauze pads, 4 x 4 inches (10 x 10 cm) to use as a dressing sponge.  Gauze pads, 4 x 4 inches (10 x 10 cm) or adhesive dressing cover.  Paper tape. How to change the dressing: 1. Wash your hands with soap and water. 2. Gently remove the old dressing. Avoid using scissors to remove the dressing because they may damage the drainage catheter. 3. Wash the skin around the insertion site with mild soap and warm water, rinse well, then pat the area dry with a clean cloth. 4. Check the skin around the drainage catheter for redness or swelling, or for yellow or green discharge that has a bad smell. 5. If the drainage catheter was stitched (sutured) to the skin, inspect the suture to make sure it is still anchored in the skin. 6. Do not apply creams, ointments, or alcohol to the site. Allow the skin to air-dry completely before you apply a new dressing. 7. Place the drainage catheter through the slit in a dressing sponge. The dressing sponge should slide under the disk that holds the drainage catheter in place. 8. Cover the drainage catheter and the dressing sponge with a 4 x 4 inch (10 x 10 cm) gauze. The drainage catheter should rest on the gauze and not on the skin. 9. Tape the dressing to the skin. 10. You may be instructed to use an  adhesive dressing covering over the top of this in place of the gauze and tape. 11. Wash your hands with soap and water. How do I flush my drainage catheter? Biliary drainagecatheters should be flushed daily, or as often as told by your health care provider. The end of the drainage catheter is closed using an IV cap. A syringe can be directly connected to the IV cap. Supplies needed:  Alcohol swab.  10 mL prefilled normal saline syringe. How to flush the drainage catheter: 1. Wash your hands with soap and water. 2. If your drainage  catheter has a stopcock attached to it, turn the stopcock toward the drainage bag. This will allow the saline to flow in the direction of your body. 3. Clean the IV cap with an alcohol swab. 4. Screw the tip of a 10 mL normal saline syringe onto the IV cap. 5. Inject the saline over 5-10 seconds. If you feel resistance while injecting, stop immediately. Avoid  pulling back on the plunger. Doing that could increase your risk of infection. 6. Remove the syringe from the cap. Turn the stopcock so that fluid flows from your body into the drainage bag. You may notice more fluid flowing into the bag after you have completed the flush. How do I attach a bag to my drainage catheter? If you are having trouble with your internal biliary drain, you may be directed by your health care provider to use bag drainage until you can be seen to fix the problem. For this reason, you should always have a collection bag and connecting tubing at home. If you do not have these supplies, remember to ask for them at your next appointment. 1. Remove the bag and the connecting tubing from their packaging. 2. Connect the funnel end of the tubing to the bag's cone-shaped stem. 3. Remove the IV cap from the biliary drain. To do this, unscrew it and replace it with the screw-on end of the tubing. 4. Save the IV cap in a plastic storage bag that can be sealed. How do I empty my collection bag? Empty the collection bag whenever it becomes 2/3 full. Also empty it before you go to sleep. Most collection bags have a drainage valve at the bottom so the bag can be that allows them to be emptied easily. 1. Wash your hands with soap and water. 2. Hold the collection bag over the toilet, basin, or collection container. Use a measuring container if your health care provider told you to measure the drainage. 3. Unscrew the valve to open it, and allow the bag to drain. 4. Close the valve securely to avoid leakage. 5. Use a tissue or disposable  napkin to wipe the valve clean. 6. Wash the measuring container with soap and water. 7. Record the amount of drainage as told by your health care provider. Contact a health care provider if:  Your pain gets worse after it had improved, and it is not relieved with pain medicines.  You have any questions about caring for your drainage catheter or collection bag.  You have any of these around your catheter insertion site or coming from it: ? Skin breakdown. ? Redness, swelling, or pain. ? Fluid or blood. ? Warmth to the touch. ? Pus or a bad smell. Get help right away if:  You have a fever or chills.  Your redness, swelling, or pain at the catheter insertion site gets worse, even though you are cleaning it well.  You have leakage of  bile around the drainage catheter.  Your drainage catheter becomes blocked or clogged.  Your drainage catheter comes out. This information is not intended to replace advice given to you by your health care provider. Make sure you discuss any questions you have with your health care provider. Document Released: 10/14/2012 Document Revised: 11/13/2015 Document Reviewed: 11/13/2015 Elsevier Interactive Patient Education  2019 [place]. Moderate Conscious Sedation, Adult, Care After These instructions provide you with information about caring for yourself after your procedure. Your health care provider may also give you more specific instructions. Your treatment has been planned according to current medical practices, but problems sometimes occur. Call your health care provider if you have any problems or questions after your procedure. What can I expect after the procedure? After your procedure, it is common:  To feel sleepy for several hours.  To feel clumsy and have poor balance for several hours.  To have poor judgment for several hours.  To vomit if you eat too soon. Follow these instructions at home: For at least 24 hours after the  procedure:   Do not: ? Participate in activities where you could fall or become injured. ? Drive. ? Use heavy machinery. ? Drink alcohol. ? Take sleeping pills or medicines that cause drowsiness. ? Make important decisions or sign legal documents. ? Take care of children on your own.  Rest. Eating and drinking  Follow the diet recommended by your health care provider.  If you vomit: ? Drink water, juice, or soup when you can drink without vomiting. ? Make sure you have little or no nausea before eating solid foods. General instructions  Have a responsible adult stay with you until you are awake and alert.  Take over-the-counter and prescription medicines only as told by your health care provider.  If you smoke, do not smoke without supervision.  Keep all follow-up visits as told by your health care provider. This is important. Contact a health care provider if:  You keep feeling nauseous or you keep vomiting.  You feel light-headed.  You develop a rash.  You have a fever. Get help right away if:  You have trouble breathing. This information is not intended to replace advice given to you by your health care provider. Make sure you discuss any questions you have with your health care provider. Document Released: 10/14/2012 Document Revised: 05/29/2015 Document Reviewed: 04/15/2015 Elsevier Interactive Patient Education  2019 [employer].

## 2018-04-28 NOTE — Procedures (Signed)
Interventional Radiology Procedure Note  Procedure: Exchange of bilateral internal/external biliary drainage catheters  Complications: None  Estimated Blood Loss: None  Findings: Right biliary drain exchanged and upsized to 12 Fr.  Left biliary drain nearly backed out of liver entirely. Exchanged for new 10 Fr drain.  Plan: Elective bilateral drain exchange in 10-12 weeks.  Venetia Night. Kathlene Cote, M.D Pager:  [phone]

## 2018-04-28 NOTE — H&P (Signed)
Chief Complaint: Patient was seen in consultation today for Biliary drain(s) exchange at the request of Dr Ky Barban  Supervising Physician: Aletta Edouard  Patient Status: [facility] - Out-pt  History of Present Illness: Matthew Lewis is a 63 y.o. male   Hx Cholangiocarcinoma Drains placed 10/2017 Has had Bilat exchanges every 4-6 weeks Did have Rt exchange just 04/23/18- secondary leakage Exam was painful and pt is requesting sedation from now on  States Rt side is still painful to touch States left side drain leaks at site when flushes  Scheduled for Bilat exchange today  Past Medical History:  Diagnosis Date   Abscess of anal and rectal regions    horse shoe abscess   Alcoholic cirrhosis ([place])    Arthritis    "everywhere; especially in my spine"   Cirrhosis ([place])    STAGE 1 CIRRHOSIS PATIENT SEES DR NAMDIGAM FOR   Colon cancer ([place]) 05/2005   TX SURGERY WITH LYMPH NODE REMOVAL   Cough    LAST FEW WEEKS SAW DR RAMOS LUNGS MILKY SPUTUM OCC   DM type 2 with diabetic peripheral neuropathy ([place])    left foot   Fatigue    Fistula, anal    multiple   History of substance abuse ([place])    last alcohol was 08/2011; last marijuania was 08/2011   Hypertension    PTSD (post-traumatic stress disorder)    "severe" HX OF   Sleep deprivation     Past Surgical History:  Procedure Laterality Date   ABSCESS DRAINAGE     "probably 15-20 so far at least; rectal"   ANAL FISTULECTOMY  12/13/1998   ANTERIOR FUSION CERVICAL SPINE  2010   "triple"; "did 2 surgeries on the same day in 1610 due to complications"   CORONARY ANGIOPLASTY  2001   detached muscle  2010   right chest; "after cervical fusion complications"   ELBOW SURGERY  2007   "cut out part of a muscle"; left   ESOPHAGEAL BANDING  02/25/2018   Procedure: ESOPHAGEAL BANDING;  Surgeon: Ladene Artist, MD;  Location: [place];  Service: Endoscopy;;   ESOPHAGOGASTRODUODENOSCOPY (EGD) WITH PROPOFOL N/A  02/25/2018   Procedure: ESOPHAGOGASTRODUODENOSCOPY (EGD) WITH PROPOFOL;  Surgeon: Ladene Artist, MD;  Location: [facility] ENDOSCOPY;  Service: Endoscopy;  Laterality: N/A;   EXAMINATION UNDER ANESTHESIA  03/22/2005   fistula   EXAMINATION UNDER ANESTHESIA  06/06/2011   Procedure: EXAM UNDER ANESTHESIA;  Surgeon: Stark Klein, MD;  Location: [place];  Service: General;  Laterality: N/A;   HEMICOLECTOMY  06/06/2005   left   INCISION AND DRAINAGE PERIRECTAL ABSCESS  03/30/2005   INCISION AND DRAINAGE PERIRECTAL ABSCESS  12/13/2004   INCISION AND DRAINAGE PERIRECTAL ABSCESS  07/14/2001   INCISION AND DRAINAGE PERIRECTAL ABSCESS  08/12/2010   horseshoe abscess; Dr Redmond Pulling   INCISION AND DRAINAGE PERIRECTAL ABSCESS  06/06/2011   Procedure: IRRIGATION AND DEBRIDEMENT PERIRECTAL ABSCESS;  Surgeon: Stark Klein, MD;  Location: [place];  Service: General;  Laterality: N/A;   INCISION AND DRAINAGE PERIRECTAL ABSCESS N/A 02/24/2017   Procedure: IRRIGATION AND DEBRIDEMENT PERIRECTAL ABSCESS;  Surgeon: Ralene Ok, MD;  Location: [place];  Service: General;  Laterality: N/A;   IR CATHETER TUBE CHANGE  02/19/2018   IR CHOLANGIOGRAM EXISTING TUBE  12/23/2017   IR CHOLANGIOGRAM EXISTING TUBE  01/02/2018   IR EXCHANGE BILIARY DRAIN  11/07/2017   IR EXCHANGE BILIARY DRAIN  12/09/2017   IR EXCHANGE BILIARY DRAIN  02/03/2018   IR EXCHANGE BILIARY  DRAIN  02/03/2018   IR EXCHANGE BILIARY DRAIN  04/03/2018   IR EXCHANGE BILIARY DRAIN  04/03/2018   IR EXCHANGE BILIARY DRAIN  04/23/2018   IR FLUORO RM 30-60 MIN  04/15/2018   IR IMAGING GUIDED PORT INSERTION  12/09/2017   IR INT EXT BILIARY DRAIN WITH CHOLANGIOGRAM  10/26/2017   IR INT EXT BILIARY DRAIN WITH CHOLANGIOGRAM  12/23/2017   IR PARACENTESIS  02/25/2018   IR PATIENT EVAL TECH 0-60 MINS  12/02/2017   IR RADIOLOGIST EVAL & MGMT  11/06/2017   IR RADIOLOGIST EVAL & MGMT  01/05/2018   LUMBAR DISC SURGERY  1987   PERCUTANEOUS PINNING PHALANX FRACTURE OF  HAND  ~ 2008   "plates in 2 places"; right   PLACEMENT OF SETON N/A 09/05/2017   Procedure: PLACEMENT OF SETON;  Surgeon: Leighton Ruff, MD;  Location[facility];  Service: General;  Laterality: N/A;   RADIOLOGY WITH ANESTHESIA N/A 10/25/2017   Procedure: MRI WITH ANESTHESIA;  Surgeon: Radiologist, Medication, MD;  Location: [place];  Service: Radiology;  Laterality: N/A;   THORACIC DISCECTOMY  1990's    Allergies: Morphine and related  Medications: Prior to Admission medications   Medication Sig Start Date End Date Taking? Authorizing Provider  Ca Carbonate-Mag Hydroxide (ROLAIDS PO) Take 1-2 tablets by mouth 2 (two) times daily as needed (heartburn).   Yes [provider]  fexofenadine (ALLEGRA) 180 MG tablet Take 180 mg by mouth at bedtime.   Yes [provider]  gabapentin (NEURONTIN) 400 MG capsule Take 400-800 mg by mouth See admin instructions. Take 400 mg by mouth in the morning and 800 mg in the evening   Yes [provider]  insulin detemir (LEVEMIR) 100 UNIT/ML injection Inject 10-62 Units into the skin See admin instructions. Take 62 in the am and 10 in the pm   Yes [provider]  lactose free nutrition (BOOST PLUS) LIQD Take 237 mLs by mouth 3 (three) times daily with meals. Patient taking differently: Take 237 mLs by mouth 3 (three) times daily with meals. Ensure chocolate and vanilla max protein 12/11/17  Yes Regalado, Belkys A, MD  levocetirizine (XYZAL) 5 MG tablet Take 5 mg by mouth every evening.   Yes [provider]  lidocaine-prilocaine (EMLA) cream Apply to port site 1 hour prior to access Patient taking differently: Apply 1 application topically once. Apply to port site 1 hour prior to access 01/20/18  Yes [name], MD  Multiple Vitamin (MULTIVITAMIN) tablet Take 1 tablet by mouth daily.   Yes [provider]  tamsulosin (FLOMAX) 0.4 MG CAPS capsule TAKE 1 CAPSULE(0.4 MG) BY MOUTH DAILY AFTER  SUPPER 03/12/18  Yes [name], MD  pantoprazole (PROTONIX) 40 MG tablet Take 1 tablet (40 mg total) by mouth 2 (two) times daily. 02/27/18   Thurnell Lose, MD  propranolol ER (INDERAL LA) 60 MG 24 hr capsule Take 1 capsule (60 mg total) by mouth daily for 30 days. 02/27/18 03/29/18  Thurnell Lose, MD  Sodium Chloride Flush (NORMAL SALINE FLUSH) 0.9 % SOLN Inject 10 mLs into the vein daily. 04/23/18   [name], MD  Sodium Chloride Flush (NORMAL SALINE FLUSH) 0.9 % SOLN Inject 10 mLs into the vein daily. 04/23/18   [name], MD     Family History  Problem Relation Age of Onset   Heart disease Father    Heart disease Mother    Cancer Mother    Cancer Sister  Heart disease Brother     Social History   Socioeconomic History   Marital status: Married    Spouse name: Not on file   Number of children: Not on file   Years of education: Not on file   Highest education level: Not on file  Occupational History   Occupation: works at [employer] resource strain: Not on file   Food insecurity:    Worry: Not on file    Inability: Not on file   Transportation needs:    Medical: Not on file    Non-medical: Not on file  Tobacco Use   Smoking status: Former Smoker    Packs/day: 1.00    Years: 10.00    Pack years: 10.00    Types: Cigarettes    Last attempt to quit: 01/08/1991    Years since quitting: 27.3   Smokeless tobacco: Never Used  Substance and Sexual Activity   Alcohol use: Not Currently    Comment: 06/05/11 "drank enough when I was younger to last me my whole life; don't remember when I had my last drink, maybe 2012"   Drug use: Not Currently    Types: Oxycodone, Marijuana    Comment: 06/05/11 "pot head in my younger days; 2009 dr had me on oxycodone and valium for almost 78yr   Sexual activity: Not Currently  Lifestyle   Physical activity:    Days per week: Not on file    Minutes per session: Not on file   Stress: To some  extent  Relationships   Social connections:    Talks on phone: Not on file    Gets together: Not on file    Attends religious service: Not on file    Active member of club or organization: Not on file    Attends meetings of clubs or organizations: Not on file    Relationship status: Not on file  Other Topics Concern   Not on file  Social History Narrative   Not on file    Review of Systems: A 12 point ROS discussed and pertinent positives are indicated in the HPI above.  All other systems are negative.  Review of Systems  Constitutional: Positive for activity change, appetite change, fatigue and unexpected weight change. Negative for fever.  Respiratory: Negative for cough and shortness of breath.   Cardiovascular: Negative for chest pain.  Gastrointestinal: Positive for abdominal pain.  Neurological: Positive for weakness.  Psychiatric/Behavioral: Negative for behavioral problems and confusion.    Vital Signs: BP 110/81    Pulse 100    Temp 97.9 F (36.6 C) (Oral)    Resp 16    Ht 5' 11"  (1.803 m)    Wt 182 lb (82.6 kg)    SpO2 100%    BMI 25.38 kg/m   Physical Exam Vitals signs reviewed.  Cardiovascular:     Rate and Rhythm: Normal rate and regular rhythm.     Heart sounds: Normal heart sounds.  Pulmonary:     Breath sounds: Normal breath sounds.  Abdominal:     General: Bowel sounds are normal.     Tenderness: There is abdominal tenderness.     Comments: Tender to touch near Rt biliary drain  Musculoskeletal: Normal range of motion.  Skin:    General: Skin is warm and dry.     Comments: Sites are clean and dry No sign of infection  Neurological:     Mental Status: He is alert and oriented  to person, place, and time.  Psychiatric:        Mood and Affect: Mood normal.        Behavior: Behavior normal.        Thought Content: Thought content normal.        Judgment: Judgment normal.     Imaging: Ir Fluoro Rm 30-60 Min  Result Date: 04/15/2018 INDICATION:  Patient with history of cholangiocarcinoma and chronic bilateral internal external biliary drains. He now presents with persistent leakage from right biliary drain as well as detachment of Stat Lock device. EXAM: FLUOROSCOPIC EXAM OF BILIARY DRAINS MEDICATIONS: None ANESTHESIA/SEDATION: None COMPLICATIONS: None immediate. PROCEDURE: Fluoroscopic exam of biliary drains reveals both catheter tips in duodenum and appropriately positioned. New Stat Lock devices were applied over both drains and both drains were placed to gravity bag drainage. Both drains were flushed with sterile normal saline without difficulty. IMPRESSION: Appropriately positioned bilateral internal external biliary drainage catheters; new StatLock devices were applied over both drains and both drains were placed to gravity bag drainage. Patient instructed to continue once daily flushing of drains with sterile normal saline. He is scheduled for routine bilateral biliary drain exchanges on 05/29/2018. Read by: Rowe Robert, PA-C Electronically Signed   By: Aletta Edouard M.D.   On: 04/15/2018 15:15   Ir Exchange Biliary Drain  Result Date: 04/23/2018 INDICATION: 63 year old male with a history of cholangiocarcinoma requiring bilateral percutaneous biliary drain placement. He presents from the cancer center with complaints of continuous leakage from around his right-sided biliary drain. EXAM: Biliary drain exchange MEDICATIONS: None ANESTHESIA/SEDATION: None FLUOROSCOPY TIME:  Fluoroscopy Time: 1 minutes 36 seconds (54 mGy). COMPLICATIONS: None immediate. PROCEDURE: Informed written consent was obtained from the patient after a thorough discussion of the procedural risks, benefits and alternatives. All questions were addressed. Maximal Sterile Barrier Technique was utilized including caps, mask, sterile gowns, sterile gloves, sterile drape, hand hygiene and skin antiseptic. A timeout was performed prior to the initiation of the procedure. The  drains were evaluated fluoroscopically. Both the left and right drain have significantly pulled back compared to the time of prior placement. The sideholes of the right-sided drainage catheter are partially out of the biliary tree and within the hepatic parenchyma. This is likely the source of the patient's leak. The left drain remains asymptomatic and has no issues with leak. After discussion with the patient, he is excepting of pursuing exchange of the right-sided biliary drain but would not like to have the left biliary drain exchanged given the fact that it is not causing him any trouble at this time. The existing catheter was cut and removed over a wire. A Kumpe the catheter was advanced over the wire and used to navigate the wire back into the duodenum. A new 10 French percutaneous biliary drainage catheter was then advanced over the wire and formed with the locking loop in the duodenum. The patient was given instructions to maintain his gravity bag no lower than his thigh. He has been wearing it on his ankle which may be creating traction on the tube in facilitating the tube pulling back. He has had multiple drain exchanges over the past several weeks. The new drainage catheter was affixed to the skin with an 0 Prolene suture following satisfactory local anesthesia by infiltration with 1% lidocaine. The patient tolerated the procedure well. IMPRESSION: Successful exchange of pulled back right-sided percutaneous internal/external 10 French biliary drainage catheter. The catheter was secured to the skin with 0 Prolene suture. Electronically Signed  By: Jacqulynn Cadet M.D.   On: 04/23/2018 16:19   Ir Exchange Biliary Drain  Result Date: 04/03/2018 INDICATION: 63 year old with cholangiocarcinoma and bilateral biliary drains. Patient is due for routine exchanges but also has leakage from the right biliary drain. Plan for biliary drain exchanges. EXAM: EXCHANGE OF BILATERAL BILIARY DRAINS WITH FLUOROSCOPY  MEDICATIONS: None ANESTHESIA/SEDATION: None FLUOROSCOPY TIME:  Fluoroscopy Time: 2 minutes, 48 seconds, 68 mGy COMPLICATIONS: None immediate. PROCEDURE: Informed written consent was obtained from the patient after a thorough discussion of the procedural risks, benefits and alternatives. All questions were addressed. Maximal Sterile Barrier Technique was utilized including caps, mask, sterile gowns, sterile gloves, sterile drape, hand hygiene and skin antiseptic. A timeout was performed prior to the initiation of the procedure. Abdomen and both drains were prepped and draped in sterile fashion. Contrast was injected through the right biliary drain. Distal aspect of the drain was not filling with contrast and likely partially obstructed. This catheter was cut and removed over a Bentson wire. New 10.2 [place] biliary drain was easily advanced over the wire and the tip was positioned in the duodenum. Contrast injection confirmed patency of the tube. Catheter was flushed with saline and capped. Attention was directed to the left biliary drain. The left biliary drain was partially dislodged and the tip was in the common bile duct region. Catheter was cut and removed over a Bentson wire. Kumpe catheter was used to advance wire into the duodenum. New 10.2 [place] biliary drain was advanced over the wire and the tip was placed in the duodenum. Contrast injection confirmed placement in the left biliary system. Catheter was flushed with saline and capped. Both drains were secured to skin with StatLocks. FINDINGS: New internal/external drains extend into the duodenum. IMPRESSION: Successful exchange of the bilateral internal/external biliary drains. Patient will continue to flush the drains daily and keep the drains capped. Patient has gravity bags in case there is a problem or leakage from the drains. Plan for routine exchange of the drains in approximately 8 weeks. Electronically Signed   By: Markus Daft M.D.   On: 04/03/2018  11:20   Ir Exchange Biliary Drain  Result Date: 04/03/2018 INDICATION: 63 year old with cholangiocarcinoma and bilateral biliary drains. Patient is due for routine exchanges but also has leakage from the right biliary drain. Plan for biliary drain exchanges. EXAM: EXCHANGE OF BILATERAL BILIARY DRAINS WITH FLUOROSCOPY MEDICATIONS: None ANESTHESIA/SEDATION: None FLUOROSCOPY TIME:  Fluoroscopy Time: 2 minutes, 48 seconds, 68 mGy COMPLICATIONS: None immediate. PROCEDURE: Informed written consent was obtained from the patient after a thorough discussion of the procedural risks, benefits and alternatives. All questions were addressed. Maximal Sterile Barrier Technique was utilized including caps, mask, sterile gowns, sterile gloves, sterile drape, hand hygiene and skin antiseptic. A timeout was performed prior to the initiation of the procedure. Abdomen and both drains were prepped and draped in sterile fashion. Contrast was injected through the right biliary drain. Distal aspect of the drain was not filling with contrast and likely partially obstructed. This catheter was cut and removed over a Bentson wire. New 10.2 [place] biliary drain was easily advanced over the wire and the tip was positioned in the duodenum. Contrast injection confirmed patency of the tube. Catheter was flushed with saline and capped. Attention was directed to the left biliary drain. The left biliary drain was partially dislodged and the tip was in the common bile duct region. Catheter was cut and removed over a Bentson wire. Kumpe catheter was used to advance wire into  the duodenum. New 10.2 [place] biliary drain was advanced over the wire and the tip was placed in the duodenum. Contrast injection confirmed placement in the left biliary system. Catheter was flushed with saline and capped. Both drains were secured to skin with StatLocks. FINDINGS: New internal/external drains extend into the duodenum. IMPRESSION: Successful exchange of the  bilateral internal/external biliary drains. Patient will continue to flush the drains daily and keep the drains capped. Patient has gravity bags in case there is a problem or leakage from the drains. Plan for routine exchange of the drains in approximately 8 weeks. Electronically Signed   By: Markus Daft M.D.   On: 04/03/2018 11:20    Labs:  CBC: Recent Labs    03/11/18 1246 03/26/18 0827 04/09/18 0840 04/23/18 1058  WBC 13.6* 7.3 8.5 13.3*  HGB 9.5* 10.3* 10.5* 10.3*  HCT 29.2* 32.4* 32.8* 31.6*  PLT 160 126* 172 141*    COAGS: Recent Labs    12/23/17 0452 01/15/18 1405 02/03/18 1315 02/24/18 0245  INR 1.18 1.19 1.11 1.31    BMP: Recent Labs    03/11/18 1316 03/26/18 0827 04/09/18 0840 04/23/18 1058  NA 128* 133* 133* 127*  K 4.0 4.2 3.9 3.9  CL 99 103 102 100  CO2 23 22 22  19*  GLUCOSE 173* 265* 204* 196*  BUN 13 15 14 14   CALCIUM 8.6* 9.1 9.3 8.9  CREATININE 0.75 0.86 0.80 0.74  GFRNONAA >60 >60 >60 >60  GFRAA >60 >60 >60 >60    LIVER FUNCTION TESTS: Recent Labs    03/11/18 1316 03/26/18 0827 04/09/18 0840 04/23/18 1058  BILITOT 1.7* 1.2 1.1 1.4*  AST 55* 37 31 42*  ALT 57* 31 24 46*  ALKPHOS 640* 405* 346* 241*  PROT 7.8 8.5* 8.5* 7.9  ALBUMIN 2.7* 2.8* 2.9* 2.9*    TUMOR MARKERS: No results for input(s): AFPTM, CEA, CA199, CHROMGRNA in the last 8760 hours.  Assessment and Plan:  Cholangiocarcinoma Bilat Biliary drains in place since 10/2017 Now for routine exchange Pt is aware of procedure benefits and risks including but not limited to: Infection; bleeding; damage to surrounding structures He is agreeable to proceed Consent signed in chart  Thank you for this interesting consult.  I greatly enjoyed meeting FADI MENTER and look forward to participating in their care.  A copy of this report was sent to the requesting provider on this date.  Electronically Signed: Lavonia Drafts, PA-C 04/28/2018, 7:42 AM   I spent a total of     25 Minutes in face to face in clinical consultation, greater than 50% of which was counseling/coordinating care for Biliary drains exchange

## 2018-04-28 NOTE — Progress Notes (Signed)
Patient D/C home at 1002. Wife came to pick up. D/C instructions and paperwork explained to patient and patient verbally taught back to RN.

## 2018-04-29 ENCOUNTER — Inpatient Hospital Stay: Payer: Medicare Other

## 2018-04-29 ENCOUNTER — Encounter: Payer: Self-pay | Admitting: Nurse Practitioner

## 2018-04-29 ENCOUNTER — Inpatient Hospital Stay (HOSPITAL_BASED_OUTPATIENT_CLINIC_OR_DEPARTMENT_OTHER): Payer: Medicare Other | Admitting: Nurse Practitioner

## 2018-04-29 ENCOUNTER — Other Ambulatory Visit: Payer: Self-pay

## 2018-04-29 ENCOUNTER — Telehealth: Payer: Self-pay | Admitting: Nurse Practitioner

## 2018-04-29 VITALS — BP 90/63 | HR 110 | Temp 98.0°F | Resp 18 | Ht 71.0 in | Wt 181.0 lb

## 2018-04-29 DIAGNOSIS — Z95828 Presence of other vascular implants and grafts: Secondary | ICD-10-CM

## 2018-04-29 DIAGNOSIS — D6481 Anemia due to antineoplastic chemotherapy: Secondary | ICD-10-CM

## 2018-04-29 DIAGNOSIS — E119 Type 2 diabetes mellitus without complications: Secondary | ICD-10-CM | POA: Diagnosis not present

## 2018-04-29 DIAGNOSIS — C221 Intrahepatic bile duct carcinoma: Secondary | ICD-10-CM

## 2018-04-29 DIAGNOSIS — R112 Nausea with vomiting, unspecified: Secondary | ICD-10-CM

## 2018-04-29 DIAGNOSIS — C7972 Secondary malignant neoplasm of left adrenal gland: Secondary | ICD-10-CM

## 2018-04-29 DIAGNOSIS — C7801 Secondary malignant neoplasm of right lung: Secondary | ICD-10-CM

## 2018-04-29 DIAGNOSIS — I81 Portal vein thrombosis: Secondary | ICD-10-CM | POA: Diagnosis not present

## 2018-04-29 DIAGNOSIS — Z794 Long term (current) use of insulin: Secondary | ICD-10-CM | POA: Diagnosis not present

## 2018-04-29 DIAGNOSIS — R42 Dizziness and giddiness: Secondary | ICD-10-CM | POA: Diagnosis not present

## 2018-04-29 DIAGNOSIS — I959 Hypotension, unspecified: Secondary | ICD-10-CM | POA: Diagnosis not present

## 2018-04-29 DIAGNOSIS — E860 Dehydration: Secondary | ICD-10-CM

## 2018-04-29 DIAGNOSIS — I10 Essential (primary) hypertension: Secondary | ICD-10-CM

## 2018-04-29 DIAGNOSIS — C7971 Secondary malignant neoplasm of right adrenal gland: Secondary | ICD-10-CM

## 2018-04-29 DIAGNOSIS — C7802 Secondary malignant neoplasm of left lung: Secondary | ICD-10-CM | POA: Diagnosis not present

## 2018-04-29 DIAGNOSIS — C772 Secondary and unspecified malignant neoplasm of intra-abdominal lymph nodes: Secondary | ICD-10-CM | POA: Diagnosis not present

## 2018-04-29 DIAGNOSIS — Z79899 Other long term (current) drug therapy: Secondary | ICD-10-CM | POA: Diagnosis not present

## 2018-04-29 DIAGNOSIS — Z5111 Encounter for antineoplastic chemotherapy: Secondary | ICD-10-CM | POA: Diagnosis not present

## 2018-04-29 LAB — CMP (CANCER CENTER ONLY)
ALT: 35 U/L (ref 0–44)
AST: 39 U/L (ref 15–41)
Albumin: 2.7 g/dL — ABNORMAL LOW (ref 3.5–5.0)
Alkaline Phosphatase: 413 U/L — ABNORMAL HIGH (ref 38–126)
Anion gap: 9 (ref 5–15)
BUN: 12 mg/dL (ref 8–23)
CO2: 23 mmol/L (ref 22–32)
Calcium: 8.9 mg/dL (ref 8.9–10.3)
Chloride: 99 mmol/L (ref 98–111)
Creatinine: 0.77 mg/dL (ref 0.61–1.24)
GFR, Est AFR Am: >60 mL/min (ref >60)
GFR, Estimated: >60 mL/min (ref >60)
Glucose, Bld: 220 mg/dL — ABNORMAL HIGH (ref 70–99)
Potassium: 3.7 mmol/L (ref 3.5–5.1)
Sodium: 131 mmol/L — ABNORMAL LOW (ref 135–145)
Total Bilirubin: 1.1 mg/dL (ref 0.3–1.2)
Total Protein: 7.9 g/dL (ref 6.5–8.1)

## 2018-04-29 LAB — CBC WITH DIFFERENTIAL (CANCER CENTER ONLY)
Abs Immature Granulocytes: 0.21 10*3/uL — ABNORMAL HIGH (ref 0.00–0.07)
Basophils Absolute: 0.1 10*3/uL (ref 0.0–0.1)
Basophils Relative: 0 %
Eosinophils Absolute: 0.1 10*3/uL (ref 0.0–0.5)
Eosinophils Relative: 1 %
HCT: 29.6 % — ABNORMAL LOW (ref 39.0–52.0)
Hemoglobin: 9.9 g/dL — ABNORMAL LOW (ref 13.0–17.0)
Immature Granulocytes: 1 %
Lymphocytes Relative: 4 %
Lymphs Abs: 0.8 10*3/uL (ref 0.7–4.0)
MCH: 31.9 pg (ref 26.0–34.0)
MCHC: 33.4 g/dL (ref 30.0–36.0)
MCV: 95.5 fL (ref 80.0–100.0)
Monocytes Absolute: 0.9 10*3/uL (ref 0.1–1.0)
Monocytes Relative: 4 %
Neutro Abs: 19.6 10*3/uL — ABNORMAL HIGH (ref 1.7–7.7)
Neutrophils Relative %: 90 %
Platelet Count: 143 10*3/uL — ABNORMAL LOW (ref 150–400)
RBC: 3.1 MIL/uL — ABNORMAL LOW (ref 4.22–5.81)
RDW: 17.8 % — ABNORMAL HIGH (ref 11.5–15.5)
WBC Count: 21.7 10*3/uL — ABNORMAL HIGH (ref 4.0–10.5)
nRBC: 0 % (ref 0.0–0.2)

## 2018-04-29 MED ORDER — SODIUM CHLORIDE 0.9 % IV SOLN
Freq: Once | INTRAVENOUS | Status: DC
  Filled 2018-04-29: qty 250

## 2018-04-29 MED ORDER — ALTEPLASE 2 MG IJ SOLR
INTRAMUSCULAR | Status: AC
  Filled 2018-04-29: qty 2

## 2018-04-29 MED ORDER — SODIUM CHLORIDE 0.9% FLUSH
10.0000 mL | INTRAVENOUS | Status: DC | PRN
  Administered 2018-04-29: 10 mL
  Filled 2018-04-29: qty 10

## 2018-04-29 MED ORDER — SODIUM CHLORIDE 0.9 % IV SOLN
Freq: Once | INTRAVENOUS | Status: AC
  Administered 2018-04-29: 10:00:00 via INTRAVENOUS
  Filled 2018-04-29: qty 250

## 2018-04-29 MED ORDER — HEPARIN SOD (PORK) LOCK FLUSH 100 UNIT/ML IV SOLN
500.0000 [IU] | Freq: Once | INTRAVENOUS | Status: AC | PRN
  Administered 2018-04-29: 12:00:00 500 [IU]
  Filled 2018-04-29: qty 5

## 2018-04-29 NOTE — Patient Instructions (Signed)

## 2018-04-29 NOTE — Telephone Encounter (Signed)
Scheduled appt per 4/22 los.

## 2018-04-29 NOTE — Patient Instructions (Signed)
Dehydration, Adult  Dehydration is when there is not enough fluid or water in your body. This happens when you lose more fluids than you take in. Dehydration can range from mild to very bad. It should be treated right away to keep it from getting very bad. Symptoms of mild dehydration may include:  Thirst.  Dry lips.  Slightly dry mouth.  Dry, warm skin.  Dizziness. Symptoms of moderate dehydration may include:  Very dry mouth.  Muscle cramps.  Dark pee (urine). Pee may be the color of tea.  Your body making less pee.  Your eyes making fewer tears.  Heartbeat that is uneven or faster than normal (palpitations).  Headache.  Light-headedness, especially when you stand up from sitting.  Fainting (syncope). Symptoms of very bad dehydration may include:  Changes in skin, such as: ? Cold and clammy skin. ? Blotchy (mottled) or pale skin. ? Skin that does not quickly return to normal after being lightly pinched and let go (poor skin turgor).  Changes in body fluids, such as: ? Feeling very thirsty. ? Your eyes making fewer tears. ? Not sweating when body temperature is high, such as in hot weather. ? Your body making very little pee.  Changes in vital signs, such as: ? Weak pulse. ? Pulse that is more than 100 beats a minute when you are sitting still. ? Fast breathing. ? Low blood pressure.  Other changes, such as: ? Sunken eyes. ? Cold hands and feet. ? Confusion. ? Lack of energy (lethargy). ? Trouble waking up from sleep. ? Short-term weight loss. ? Unconsciousness. Follow these instructions at home:   If told by your doctor, drink an ORS: ? Make an ORS by using instructions on the package. ? Start by drinking small amounts, about  cup (120 mL) every 5-10 minutes. ? Slowly drink more until you have had the amount that your doctor said to have.  Drink enough clear fluid to keep your pee clear or pale yellow. If you were told to drink an ORS, finish the  ORS first, then start slowly drinking clear fluids. Drink fluids such as: ? Water. Do not drink only water by itself. Doing that can make the salt (sodium) level in your body get too low (hyponatremia). ? Ice chips. ? Fruit juice that you have added water to (diluted). ? Low-calorie sports drinks.  Avoid: ? Alcohol. ? Drinks that have a lot of sugar. These include high-calorie sports drinks, fruit juice that does not have water added, and soda. ? Caffeine. ? Foods that are greasy or have a lot of fat or sugar.  Take over-the-counter and prescription medicines only as told by your doctor.  Do not take salt tablets. Doing that can make the salt level in your body get too high (hypernatremia).  Eat foods that have minerals (electrolytes). Examples include bananas, oranges, potatoes, tomatoes, and spinach.  Keep all follow-up visits as told by your doctor. This is important. Contact a doctor if:  You have belly (abdominal) pain that: ? Gets worse. ? Stays in one area (localizes).  You have a rash.  You have a stiff neck.  You get angry or annoyed more easily than normal (irritability).  You are more sleepy than normal.  You have a harder time waking up than normal.  You feel: ? Weak. ? Dizzy. ? Very thirsty.  You have peed (urinated) only a small amount of very dark pee during 6-8 hours. Get help right away if:  You have   symptoms of very bad dehydration.  You cannot drink fluids without throwing up (vomiting).  Your symptoms get worse with treatment.  You have a fever.  You have a very bad headache.  You are throwing up or having watery poop (diarrhea) and it: ? Gets worse. ? Does not go away.  You have blood or something green (bile) in your throw-up.  You have blood in your poop (stool). This may cause poop to look black and tarry.  You have not peed in 6-8 hours.  You pass out (faint).  Your heart rate when you are sitting still is more than 100 beats a  minute.  You have trouble breathing. This information is not intended to replace advice given to you by your health care provider. Make sure you discuss any questions you have with your health care provider. Document Released: 10/20/2008 Document Revised: 07/14/2015 Document Reviewed: 02/17/2015 Elsevier Interactive Patient Education  2019 [employer]  

## 2018-04-30 ENCOUNTER — Telehealth: Payer: Self-pay | Admitting: *Deleted

## 2018-04-30 LAB — CANCER ANTIGEN 19-9: CA 19-9: 30 U/mL (ref 0–35)

## 2018-05-01 ENCOUNTER — Other Ambulatory Visit: Payer: Self-pay

## 2018-05-01 ENCOUNTER — Inpatient Hospital Stay: Payer: Medicare Other

## 2018-05-01 ENCOUNTER — Other Ambulatory Visit: Payer: Self-pay | Admitting: Medical Oncology

## 2018-05-01 VITALS — BP 111/69 | HR 100 | Temp 98.4°F | Resp 16

## 2018-05-01 DIAGNOSIS — Z5111 Encounter for antineoplastic chemotherapy: Secondary | ICD-10-CM | POA: Diagnosis not present

## 2018-05-01 DIAGNOSIS — C221 Intrahepatic bile duct carcinoma: Secondary | ICD-10-CM | POA: Diagnosis not present

## 2018-05-01 DIAGNOSIS — Z794 Long term (current) use of insulin: Secondary | ICD-10-CM | POA: Diagnosis not present

## 2018-05-01 DIAGNOSIS — D6481 Anemia due to antineoplastic chemotherapy: Secondary | ICD-10-CM | POA: Diagnosis not present

## 2018-05-01 DIAGNOSIS — C7802 Secondary malignant neoplasm of left lung: Secondary | ICD-10-CM | POA: Diagnosis not present

## 2018-05-01 DIAGNOSIS — E119 Type 2 diabetes mellitus without complications: Secondary | ICD-10-CM | POA: Diagnosis not present

## 2018-05-01 DIAGNOSIS — C7971 Secondary malignant neoplasm of right adrenal gland: Secondary | ICD-10-CM

## 2018-05-01 DIAGNOSIS — I959 Hypotension, unspecified: Secondary | ICD-10-CM | POA: Diagnosis not present

## 2018-05-01 DIAGNOSIS — Z95828 Presence of other vascular implants and grafts: Secondary | ICD-10-CM

## 2018-05-01 DIAGNOSIS — R42 Dizziness and giddiness: Secondary | ICD-10-CM | POA: Diagnosis not present

## 2018-05-01 DIAGNOSIS — Z79899 Other long term (current) drug therapy: Secondary | ICD-10-CM | POA: Diagnosis not present

## 2018-05-01 DIAGNOSIS — C772 Secondary and unspecified malignant neoplasm of intra-abdominal lymph nodes: Secondary | ICD-10-CM | POA: Diagnosis not present

## 2018-05-01 DIAGNOSIS — I10 Essential (primary) hypertension: Secondary | ICD-10-CM | POA: Diagnosis not present

## 2018-05-01 DIAGNOSIS — C7972 Secondary malignant neoplasm of left adrenal gland: Secondary | ICD-10-CM | POA: Diagnosis not present

## 2018-05-01 DIAGNOSIS — I81 Portal vein thrombosis: Secondary | ICD-10-CM | POA: Diagnosis not present

## 2018-05-01 DIAGNOSIS — C7801 Secondary malignant neoplasm of right lung: Secondary | ICD-10-CM | POA: Diagnosis not present

## 2018-05-01 LAB — CMP (CANCER CENTER ONLY)
ALT: 35 U/L (ref 0–44)
AST: 39 U/L (ref 15–41)
Albumin: 2.8 g/dL — ABNORMAL LOW (ref 3.5–5.0)
Alkaline Phosphatase: 401 U/L — ABNORMAL HIGH (ref 38–126)
Anion gap: 12 (ref 5–15)
BUN: 14 mg/dL (ref 8–23)
CO2: 22 mmol/L (ref 22–32)
Calcium: 9.1 mg/dL (ref 8.9–10.3)
Chloride: 99 mmol/L (ref 98–111)
Creatinine: 0.76 mg/dL (ref 0.61–1.24)
GFR, Est AFR Am: >60 mL/min (ref >60)
GFR, Estimated: >60 mL/min (ref >60)
Glucose, Bld: 188 mg/dL — ABNORMAL HIGH (ref 70–99)
Potassium: 3.4 mmol/L — ABNORMAL LOW (ref 3.5–5.1)
Sodium: 133 mmol/L — ABNORMAL LOW (ref 135–145)
Total Bilirubin: 0.9 mg/dL (ref 0.3–1.2)
Total Protein: 8.1 g/dL (ref 6.5–8.1)

## 2018-05-01 LAB — CBC WITH DIFFERENTIAL (CANCER CENTER ONLY)
Abs Immature Granulocytes: 0.18 10*3/uL — ABNORMAL HIGH (ref 0.00–0.07)
Basophils Absolute: 0.1 10*3/uL (ref 0.0–0.1)
Basophils Relative: 0 %
Eosinophils Absolute: 0.3 10*3/uL (ref 0.0–0.5)
Eosinophils Relative: 1 %
HCT: 31.3 % — ABNORMAL LOW (ref 39.0–52.0)
Hemoglobin: 10.4 g/dL — ABNORMAL LOW (ref 13.0–17.0)
Immature Granulocytes: 1 %
Lymphocytes Relative: 7 %
Lymphs Abs: 1.3 10*3/uL (ref 0.7–4.0)
MCH: 31.9 pg (ref 26.0–34.0)
MCHC: 33.2 g/dL (ref 30.0–36.0)
MCV: 96 fL (ref 80.0–100.0)
Monocytes Absolute: 1.5 10*3/uL — ABNORMAL HIGH (ref 0.1–1.0)
Monocytes Relative: 8 %
Neutro Abs: 16.3 10*3/uL — ABNORMAL HIGH (ref 1.7–7.7)
Neutrophils Relative %: 83 %
Platelet Count: 166 10*3/uL (ref 150–400)
RBC: 3.26 MIL/uL — ABNORMAL LOW (ref 4.22–5.81)
RDW: 18 % — ABNORMAL HIGH (ref 11.5–15.5)
WBC Count: 19.6 10*3/uL — ABNORMAL HIGH (ref 4.0–10.5)
nRBC: 0 % (ref 0.0–0.2)

## 2018-05-01 MED ORDER — SODIUM CHLORIDE 0.9 % IV SOLN
Freq: Once | INTRAVENOUS | Status: AC
  Administered 2018-05-01: 10:00:00 via INTRAVENOUS
  Filled 2018-05-01: qty 250

## 2018-05-01 MED ORDER — HEPARIN SOD (PORK) LOCK FLUSH 100 UNIT/ML IV SOLN
500.0000 [IU] | Freq: Once | INTRAVENOUS | Status: AC | PRN
  Administered 2018-05-01: 13:00:00 500 [IU]
  Filled 2018-05-01: qty 5

## 2018-05-01 MED ORDER — SODIUM CHLORIDE 0.9% FLUSH
10.0000 mL | INTRAVENOUS | Status: DC | PRN
  Administered 2018-05-01: 13:00:00 10 mL
  Filled 2018-05-01: qty 10

## 2018-05-01 MED FILL — NORMAL SALINE FLUSH SYRINGE: 0.9 | 30 days supply | Qty: 300 | Fill #0

## 2018-05-01 NOTE — Patient Instructions (Signed)
Dehydration, Adult  Dehydration is when there is not enough fluid or water in your body. This happens when you lose more fluids than you take in. Dehydration can range from mild to very bad. It should be treated right away to keep it from getting very bad. Symptoms of mild dehydration may include:  Thirst.  Dry lips.  Slightly dry mouth.  Dry, warm skin.  Dizziness. Symptoms of moderate dehydration may include:  Very dry mouth.  Muscle cramps.  Dark pee (urine). Pee may be the color of tea.  Your body making less pee.  Your eyes making fewer tears.  Heartbeat that is uneven or faster than normal (palpitations).  Headache.  Light-headedness, especially when you stand up from sitting.  Fainting (syncope). Symptoms of very bad dehydration may include:  Changes in skin, such as: ? Cold and clammy skin. ? Blotchy (mottled) or pale skin. ? Skin that does not quickly return to normal after being lightly pinched and let go (poor skin turgor).  Changes in body fluids, such as: ? Feeling very thirsty. ? Your eyes making fewer tears. ? Not sweating when body temperature is high, such as in hot weather. ? Your body making very little pee.  Changes in vital signs, such as: ? Weak pulse. ? Pulse that is more than 100 beats a minute when you are sitting still. ? Fast breathing. ? Low blood pressure.  Other changes, such as: ? Sunken eyes. ? Cold hands and feet. ? Confusion. ? Lack of energy (lethargy). ? Trouble waking up from sleep. ? Short-term weight loss. ? Unconsciousness. Follow these instructions at home:   If told by your doctor, drink an ORS: ? Make an ORS by using instructions on the package. ? Start by drinking small amounts, about  cup (120 mL) every 5-10 minutes. ? Slowly drink more until you have had the amount that your doctor said to have.  Drink enough clear fluid to keep your pee clear or pale yellow. If you were told to drink an ORS, finish the  ORS first, then start slowly drinking clear fluids. Drink fluids such as: ? Water. Do not drink only water by itself. Doing that can make the salt (sodium) level in your body get too low (hyponatremia). ? Ice chips. ? Fruit juice that you have added water to (diluted). ? Low-calorie sports drinks.  Avoid: ? Alcohol. ? Drinks that have a lot of sugar. These include high-calorie sports drinks, fruit juice that does not have water added, and soda. ? Caffeine. ? Foods that are greasy or have a lot of fat or sugar.  Take over-the-counter and prescription medicines only as told by your doctor.  Do not take salt tablets. Doing that can make the salt level in your body get too high (hypernatremia).  Eat foods that have minerals (electrolytes). Examples include bananas, oranges, potatoes, tomatoes, and spinach.  Keep all follow-up visits as told by your doctor. This is important. Contact a doctor if:  You have belly (abdominal) pain that: ? Gets worse. ? Stays in one area (localizes).  You have a rash.  You have a stiff neck.  You get angry or annoyed more easily than normal (irritability).  You are more sleepy than normal.  You have a harder time waking up than normal.  You feel: ? Weak. ? Dizzy. ? Very thirsty.  You have peed (urinated) only a small amount of very dark pee during 6-8 hours. Get help right away if:  You have   symptoms of very bad dehydration.  You cannot drink fluids without throwing up (vomiting).  Your symptoms get worse with treatment.  You have a fever.  You have a very bad headache.  You are throwing up or having watery poop (diarrhea) and it: ? Gets worse. ? Does not go away.  You have blood or something green (bile) in your throw-up.  You have blood in your poop (stool). This may cause poop to look black and tarry.  You have not peed in 6-8 hours.  You pass out (faint).  Your heart rate when you are sitting still is more than 100 beats a  minute.  You have trouble breathing. This information is not intended to replace advice given to you by your health care provider. Make sure you discuss any questions you have with your health care provider. Document Released: 10/20/2008 Document Revised: 07/14/2015 Document Reviewed: 02/17/2015 Elsevier Interactive Patient Education  2019 [employer]  

## 2018-05-02 ENCOUNTER — Other Ambulatory Visit: Payer: Self-pay

## 2018-05-02 ENCOUNTER — Inpatient Hospital Stay: Payer: Medicare Other

## 2018-05-02 VITALS — BP 114/70 | HR 93 | Temp 98.2°F | Resp 17

## 2018-05-02 DIAGNOSIS — C772 Secondary and unspecified malignant neoplasm of intra-abdominal lymph nodes: Secondary | ICD-10-CM | POA: Diagnosis not present

## 2018-05-02 DIAGNOSIS — C7802 Secondary malignant neoplasm of left lung: Secondary | ICD-10-CM | POA: Diagnosis not present

## 2018-05-02 DIAGNOSIS — Z794 Long term (current) use of insulin: Secondary | ICD-10-CM | POA: Diagnosis not present

## 2018-05-02 DIAGNOSIS — C7972 Secondary malignant neoplasm of left adrenal gland: Secondary | ICD-10-CM | POA: Diagnosis not present

## 2018-05-02 DIAGNOSIS — I959 Hypotension, unspecified: Secondary | ICD-10-CM | POA: Diagnosis not present

## 2018-05-02 DIAGNOSIS — Z5111 Encounter for antineoplastic chemotherapy: Secondary | ICD-10-CM | POA: Diagnosis not present

## 2018-05-02 DIAGNOSIS — C7801 Secondary malignant neoplasm of right lung: Secondary | ICD-10-CM | POA: Diagnosis not present

## 2018-05-02 DIAGNOSIS — R42 Dizziness and giddiness: Secondary | ICD-10-CM | POA: Diagnosis not present

## 2018-05-02 DIAGNOSIS — C7971 Secondary malignant neoplasm of right adrenal gland: Secondary | ICD-10-CM | POA: Diagnosis not present

## 2018-05-02 DIAGNOSIS — C221 Intrahepatic bile duct carcinoma: Secondary | ICD-10-CM | POA: Diagnosis not present

## 2018-05-02 DIAGNOSIS — D6481 Anemia due to antineoplastic chemotherapy: Secondary | ICD-10-CM | POA: Diagnosis not present

## 2018-05-02 DIAGNOSIS — Z79899 Other long term (current) drug therapy: Secondary | ICD-10-CM | POA: Diagnosis not present

## 2018-05-02 DIAGNOSIS — I10 Essential (primary) hypertension: Secondary | ICD-10-CM | POA: Diagnosis not present

## 2018-05-02 DIAGNOSIS — I81 Portal vein thrombosis: Secondary | ICD-10-CM | POA: Diagnosis not present

## 2018-05-02 DIAGNOSIS — Z95828 Presence of other vascular implants and grafts: Secondary | ICD-10-CM

## 2018-05-02 DIAGNOSIS — E119 Type 2 diabetes mellitus without complications: Secondary | ICD-10-CM | POA: Diagnosis not present

## 2018-05-02 LAB — CANCER ANTIGEN 19-9: CA 19-9: 33 U/mL (ref 0–35)

## 2018-05-02 MED ORDER — HEPARIN SOD (PORK) LOCK FLUSH 100 UNIT/ML IV SOLN
500.0000 [IU] | Freq: Once | INTRAVENOUS | Status: AC | PRN
  Administered 2018-05-02: 14:00:00 500 [IU]
  Filled 2018-05-02: qty 5

## 2018-05-02 MED ORDER — SODIUM CHLORIDE 0.9 % IV SOLN
Freq: Once | INTRAVENOUS | Status: AC
  Administered 2018-05-02: 12:00:00 via INTRAVENOUS
  Filled 2018-05-02: qty 250

## 2018-05-02 MED ORDER — SODIUM CHLORIDE 0.9% FLUSH
10.0000 mL | INTRAVENOUS | Status: DC | PRN
  Administered 2018-05-02: 14:00:00 10 mL
  Filled 2018-05-02: qty 10

## 2018-05-02 NOTE — Patient Instructions (Signed)

## 2018-05-04 ENCOUNTER — Other Ambulatory Visit (HOSPITAL_COMMUNITY): Payer: Self-pay | Admitting: Interventional Radiology

## 2018-05-04 ENCOUNTER — Other Ambulatory Visit: Payer: Self-pay

## 2018-05-04 ENCOUNTER — Ambulatory Visit (HOSPITAL_COMMUNITY)
Admission: RE | Admit: 2018-05-04 | Discharge: 2018-05-04 | Disposition: A | Discharge status: Home / Self Care | Payer: Medicare Other | Source: Ambulatory Visit | Attending: Interventional Radiology | Admitting: Interventional Radiology

## 2018-05-04 ENCOUNTER — Encounter (HOSPITAL_COMMUNITY): Payer: Self-pay | Admitting: Interventional Radiology

## 2018-05-04 DIAGNOSIS — T83030A Leakage of cystostomy catheter, initial encounter: Secondary | ICD-10-CM | POA: Diagnosis not present

## 2018-05-04 DIAGNOSIS — Z4682 Encounter for fitting and adjustment of non-vascular catheter: Secondary | ICD-10-CM | POA: Diagnosis not present

## 2018-05-04 DIAGNOSIS — K831 Obstruction of bile duct: Secondary | ICD-10-CM

## 2018-05-04 HISTORY — PX: IR CHOLANGIOGRAM EXISTING TUBE: IMG6040

## 2018-05-04 MED ORDER — IOHEXOL 300 MG/ML  SOLN
50.0000 mL | Freq: Once | INTRAMUSCULAR | Status: AC | PRN
  Administered 2018-05-04: 16:00:00 10 mL via INTRA_ARTERIAL

## 2018-05-04 NOTE — Progress Notes (Signed)
Matthew Lewis   [phone] [phone]   Clinic Follow up Note   Patient Care Team: [name], MD as PCP - General (Internal Medicine)  Date of Service:  05/07/2018  CHIEF COMPLAINT: F/u on metastatic cholangiocarcinoma  SUMMARY OF ONCOLOGIC HISTORY: Oncology History   Cancer Staging Intrahepatic cholangiocarcinoma ([place]) Staging form: Intrahepatic Bile Duct, AJCC 8th Edition - Clinical stage from 10/29/2017: Stage IV (cT2, cN1, pM1) - Signed by Matthew [name], MD on 11/06/2017       Malignant neoplasm metastatic to adrenal gland [name])    Initial Diagnosis    Malignant neoplasm metastatic to adrenal gland ([name])    10/23/2017 Imaging    ABD US IMPRESSION: Cirrhotic appearance of the liver.  2.6 cm questionable ill-defined mass versus area of focal parenchymal heterogeneity in segment 6 of the liver. If further imaging evaluation is desired, liver protocol abdominal MRI should be considered.  Splenomegaly.  Apparent thickening of the gallbladder wall, without evidence of cholelithiasis, and with negative sonographic Murphy's sign. The findings are favored to represent gallbladder wall edema secondary to systemic causes such as hyperproteinemia rather than acute cholecystitis. Please correlate clinically.     10/25/2017 Imaging    MRI IMPRESSION: 1. Advanced changes of cirrhosis. Interval development of multifocal enhancing liver lesions, which is concerning for multicentric hepatocellular carcinoma involving both lobes of liver. 2. Lesion within segment 4 B obstructs the right medial hepatic duct and left main duct resulting in moderate to marked biliary ductal dilatation within the left lobe and dome of liver. There may be partial obstruction of the right lateral duct with mild dilatation. 3. Tumor thrombus within the left branch of the portal vein likely secondary to segment 4 B tumor. 4. Bilateral adrenal metastasis and  retroperitoneal nodal metastasis. 5. Stigmata of portal venous hypertension including varices, ascites and splenomegaly. 6. These results will be called to the ordering clinician or representative by the Radiologist Assistant, and communication documented in the PACS or zVision Dashboard.    10/26/2017 Procedure    IMPRESSION: Status post formal PTC and internal/external biliary drainage.    10/27/2017 Imaging    IMPRESSION: Changes of cirrhosis. Multiple hepatic lesions are noted concerning for multifocal hepatoma or metastases. Biliary drain is in place with the tip in the distal common bile duct. There is mild intrahepatic biliary ductal dilatation. Bilateral adrenal nodules and masses, right larger than left compatible with metastases. Retroperitoneal adenopathy. Large stool burden with mild gaseous distention of the colon. Aortic atherosclerosis.     10/29/2017 Pathology Results    Diagnosis Soft Tissue Needle Core Biopsy, right adrenal mass - METASTATIC ADENOCARCINOMA, SEE COMMENT. Microscopic Comment There are malignant glands with central necrosis. Immunohistochemistry is positive for cytokeratin 20 and CDX-2. Cytokeratin 7, TTF-1 and NapsinA are negative. The morphology and immunoprofile favor a colorectal primary over pancreaticobiliary. Dr. Jeannie Lewis has reviewed the case. [name] was notified on 10/31/2017.    11/04/2017 Procedure    Colonoscopy impression - Seton in place found on digital rectal exam. - Congested mucosa in the rectum, in the sigmoid colon and in the descending colon. - A few diminutive polyps in the rectum and in the sigmoid colon. No colon or rectal mass identified. - No specimens collected.    02/19/2018 Imaging    CT CAP IMPRESSION: 1. Slight interval increase in size of left adrenal gland metastasis. Similar-appearing right adrenal gland metastasis. 2. While difficult to measure given ill-defined nature, infiltrative mass within the left  hepatic lobe  is grossly similar when compared to prior exam. Additional lesions within the liver are grossly similar. 3. Interval insertion of biliary drain within the left hepatic lobe with resultant decompression of the left hepatic lobe biliary system. 4. Similar-appearing sharply marginated consolidation within the right lower lobe which may represent infectious/inflammatory process or metastatic disease. 5. Large amount of stool throughout the colon as can be seen with constipation. 6. Persistent left hepatic lobe portal venous thrombosis. 7. Splenomegaly. 8. New moderate volume ascites.     Intrahepatic cholangiocarcinoma ([place])   10/27/2017 Imaging    10/27/2017 CT AP IMPRESSION: Changes of cirrhosis. Multiple hepatic lesions are noted concerning for multifocal hepatoma or metastases.  Biliary drain is in place with the tip in the distal common bile duct. There is mild intrahepatic biliary ductal dilatation.  Bilateral adrenal nodules and masses, right larger than left compatible with metastases.  Retroperitoneal adenopathy.  Large stool burden with mild gaseous distention of the colon.  Aortic atherosclerosis.    10/29/2017 Cancer Staging    Staging form: Intrahepatic Bile Duct, AJCC 8th Edition - Clinical stage from 10/29/2017: Stage IV (cT2, cN1, pM1) - Signed by Matthew [name], MD on 11/06/2017    10/29/2017 Pathology Results    10/29/2017 Surgical Pathology Diagnosis Soft Tissue Needle Core Biopsy, right adrenal mass - METASTATIC ADENOCARCINOMA, SEE COMMENT.    11/06/2017 Initial Diagnosis    Intrahepatic cholangiocarcinoma ([place])    11/06/2017 Imaging    11/06/2017 CT AP IMPRESSION: 1. Increase in RIGHT pleural effusion. Stable masslike consolidation at the RIGHT lung base representing neoplasm versus pneumonia. 2. Stable metastatic nodule at the RIGHT lung base. 3. Infiltrative mass in the central LEFT hepatic lobe and discrete lesions in the RIGHT  hepatic lobe consistent with metastatic disease versus primary hepatic biliary carcinoma. Favor cholangiocarcinoma. 4. Percutaneous drainage catheter extends the porta hepatis. Small trapped pneumothorax along the tract of the catheter at the inferolateral RIGHT lung base. 5. Stable adrenal metastasis. 6. Metastatic adenopathy in the retroperitoneum unchanged. 7. Morphologic changes in liver consistent with  cirrhosis.    11/08/2017 Imaging    11/08/2017 CT AP IMPRESSION: 1. Overall no significant interval change since the CT of 11/06/2017. Stable positioning of the percutaneous biliary drain with similar degree of biliary ductal dilatation. 2. Cirrhosis with multiple hepatic metastatic disease. Infiltrative appearing lesion in the left lobe of the liver appears to extend into the left portal vein and may represent a tumor thrombus, or bland thrombus. A primary biliary neoplasm (cholangiocarcinoma, or metastatic disease are other considerations. 3. Retroperitoneal, mesenteric, and adrenal metastatic disease. 4. Hepatic colopathy versus less likely segmental colitis of the hepatic flexure. 5. Small right pleural effusion and bilateral lung base pulmonary masses/nodules as seen previously. 6. Tiny pockets of pneumoperitoneum lateral to the liver along the percutaneous drain, decreased since the prior CT. 7. Other findings as above.    11/20/2017 - 11/24/2017 Hospital Admission    Admit date: 11/08/2017 Admission diagnosis: fatigue Additional comments: discharged 11/24/2017    12/18/2017 -  Chemotherapy    first line chemo gemcitabine/cisplatin on days 1 and 15 every 28 days starting 12/18/17    12/22/2017 - 12/30/2017 Hospital Admission    Admit date: 12/22/2017 Admission diagnosis: Dysuria    12/22/2017 Imaging    12/22/2017 CT AP IMPRESSION: 1. Prominently distended and otherwise normal urinary bladder with mild prostatomegaly. Findings suggest acute bladder  outlet obstruction. No hydronephrosis. 2. Large colorectal stool volume compatible with constipation. New mild circumferential rectal wall thickening and perirectal  fat haziness suggesting stercoral colitis. No free air. No abscess. 3. Stable infiltrative cholangiocarcinoma in the central left liver lobe with associated peripheral intrahepatic biliary ductal dilatation throughout the left liver lobe. 4. Stable well-positioned internal external drain traversing the right liver lobe and decompressing the right liver biliary system. 5. Stable liver, bilateral adrenal, retroperitoneal, left mesenteric and right infrahilar nodal and left lung base metastases. No progressive metastatic disease since 12/03/2017 CT. 6. Cirrhosis. Trace perihepatic ascites, decreased. Stable moderate splenomegaly. 7.  Aortic Atherosclerosis (ICD10-I70.0).    03/11/2018 -  Chemotherapy    The patient had palonosetron (ALOXI) injection 0.25 mg, 0.25 mg, Intravenous,  Once, 3 of 6 cycles Administration: 0.25 mg (03/26/2018), 0.25 mg (04/09/2018), 0.25 mg (04/23/2018) pegfilgrastim (NEULASTA) injection 6 mg, 6 mg, Subcutaneous, Once, 3 of 6 cycles Administration: 6 mg (04/11/2018), 6 mg (04/25/2018) leucovorin 840 mg in sodium chloride 0.9 % 250 mL infusion, 400 mg/m2 = 840 mg, Intravenous,  Once, 4 of 7 cycles Administration: 840 mg (03/11/2018), 840 mg (03/26/2018), 840 mg (04/09/2018), 840 mg (04/23/2018) oxaliplatin (ELOXATIN) 150 mg in dextrose 5 % 500 mL chemo infusion, 71 mg/m2 = 145 mg (100 % of original dose 70 mg/m2), Intravenous,  Once, 2 of 5 cycles Dose modification: 70 mg/m2 (original dose 70 mg/m2, Cycle 2, Reason: Provider Judgment, Comment: thrombocytopenia ) Administration: 150 mg (03/26/2018), 150 mg (04/09/2018) fluorouracil (ADRUCIL) 4,200 mg in sodium chloride 0.9 % 66 mL chemo infusion, 2,000 mg/m2 = 4,200 mg (100 % of original dose 2,000 mg/m2), Intravenous, 1 Day/Dose, 4 of 7 cycles Dose modification: 2,000  mg/m2 (original dose 2,000 mg/m2, Cycle 1, Reason: Provider Judgment) Administration: 4,200 mg (03/11/2018), 5,000 mg (03/26/2018), 5,000 mg (04/09/2018), 5,000 mg (04/23/2018)  for chemotherapy treatment.       CURRENT THERAPY:  Second lineFOLFOX every 2 weeks started 03/11/2018  INTERVAL HISTORY:  LAQUINCY EASTRIDGE is here for a follow up and treatment. He presents to the clinic today by himself. He notes his right drain was replaced and he notes it is still leaking. He notes he is draining is a lot of output and changes his drain 8-10 time around. He notes he is trying to  Keep up with liquid intake.  He notes pain from bile acid effecting his skin at right leaking site. He denies any fever. He notes he uses and gets up and moves slowly as he is still unbalanced. He notes he was able to gain a couple pounds since last visit.   REVIEW OF SYSTEMS:   Constitutional: Denies fevers, chills or abnormal weight loss Eyes: Denies blurriness of vision Ears, nose, mouth, throat, and face: Denies mucositis or sore throat Respiratory: Denies cough, dyspnea or wheezes Cardiovascular: Denies palpitation, chest discomfort or lower extremity swelling Gastrointestinal:  Denies nausea, heartburn or change in bowel habits (+) Mild pain and leaking at right draining site  Skin: Denies abnormal skin rashes  Lymphatics: Denies new lymphadenopathy or easy bruising Neurological:Denies numbness, tingling or new weaknesses (+) unbalanced, ambulated with cane  Behavioral/Psych: Mood is stable, no new changes  All other systems were reviewed with the patient and are negative.  MEDICAL HISTORY:  Past Medical History:  Diagnosis Date   Abscess of anal and rectal regions    horse shoe abscess   Alcoholic cirrhosis ([place])    Arthritis    "everywhere; especially in my spine"   Cirrhosis ([place])    STAGE 1 CIRRHOSIS PATIENT SEES DR NAMDIGAM FOR   Colon cancer ([place]) 05/2005  TX SURGERY WITH LYMPH NODE REMOVAL    Cough    LAST FEW WEEKS SAW DR RAMOS LUNGS MILKY SPUTUM [place]   DM type 2 with diabetic peripheral neuropathy ([place])    left foot   Fatigue    Fistula, anal    multiple   History of substance abuse ([place])    last alcohol was 08/2011; last marijuania was 08/2011   Hypertension    PTSD (post-traumatic stress disorder)    "severe" HX OF   Sleep deprivation     SURGICAL HISTORY: Past Surgical History:  Procedure Laterality Date   ABSCESS DRAINAGE     "probably 15-20 so far at least; rectal"   ANAL FISTULECTOMY  12/13/1998   ANTERIOR FUSION CERVICAL SPINE  2010   "triple"; "did 2 surgeries on the same day in [id number] due to complications"   CORONARY ANGIOPLASTY  2001   detached muscle  2010   right chest; "after cervical fusion complications"   ELBOW SURGERY  2007   "cut out part of a muscle"; left   ESOPHAGEAL BANDING  02/25/2018   Procedure: ESOPHAGEAL BANDING;  Surgeon: Ladene Artist, MD;  Location: [place];  Service: Endoscopy;;   ESOPHAGOGASTRODUODENOSCOPY (EGD) WITH PROPOFOL N/A 02/25/2018   Procedure: ESOPHAGOGASTRODUODENOSCOPY (EGD) WITH PROPOFOL;  Surgeon: Ladene Artist, MD;  Location: [facility] ENDOSCOPY;  Service: Endoscopy;  Laterality: N/A;   EXAMINATION UNDER ANESTHESIA  03/22/2005   fistula   EXAMINATION UNDER ANESTHESIA  06/06/2011   Procedure: EXAM UNDER ANESTHESIA;  Surgeon: Stark Klein, MD;  Location: [place];  Service: General;  Laterality: N/A;   HEMICOLECTOMY  06/06/2005   left   INCISION AND DRAINAGE PERIRECTAL ABSCESS  03/30/2005   INCISION AND DRAINAGE PERIRECTAL ABSCESS  12/13/2004   INCISION AND DRAINAGE PERIRECTAL ABSCESS  07/14/2001   INCISION AND DRAINAGE PERIRECTAL ABSCESS  08/12/2010   horseshoe abscess; Dr Redmond Pulling   INCISION AND DRAINAGE PERIRECTAL ABSCESS  06/06/2011   Procedure: IRRIGATION AND DEBRIDEMENT PERIRECTAL ABSCESS;  Surgeon: Stark Klein, MD;  Location: [place];  Service: General;  Laterality: N/A;   INCISION AND DRAINAGE  PERIRECTAL ABSCESS N/A 02/24/2017   Procedure: IRRIGATION AND DEBRIDEMENT PERIRECTAL ABSCESS;  Surgeon: Ralene Ok, MD;  Location: [place];  Service: General;  Laterality: N/A;   IR CATHETER TUBE CHANGE  02/19/2018   IR CHOLANGIOGRAM EXISTING TUBE  12/23/2017   IR CHOLANGIOGRAM EXISTING TUBE  01/02/2018   IR CHOLANGIOGRAM EXISTING TUBE  05/04/2018   IR EXCHANGE BILIARY DRAIN  11/07/2017   IR EXCHANGE BILIARY DRAIN  12/09/2017   IR EXCHANGE BILIARY DRAIN  02/03/2018   IR EXCHANGE BILIARY DRAIN  02/03/2018   IR EXCHANGE BILIARY DRAIN  04/03/2018   IR EXCHANGE BILIARY DRAIN  04/03/2018   IR EXCHANGE BILIARY DRAIN  04/23/2018   IR EXCHANGE BILIARY DRAIN  04/28/2018   IR EXCHANGE BILIARY DRAIN  04/28/2018   IR FLUORO RM 30-60 MIN  04/15/2018   IR IMAGING GUIDED PORT INSERTION  12/09/2017   IR INT EXT BILIARY DRAIN WITH CHOLANGIOGRAM  10/26/2017   IR INT EXT BILIARY DRAIN WITH CHOLANGIOGRAM  12/23/2017   IR PARACENTESIS  02/25/2018   IR PATIENT EVAL TECH 0-60 MINS  12/02/2017   IR RADIOLOGIST EVAL & MGMT  11/06/2017   IR RADIOLOGIST EVAL & MGMT  01/05/2018   LUMBAR DISC SURGERY  1987   PERCUTANEOUS PINNING PHALANX FRACTURE OF HAND  ~ 2008   "plates in 2 places"; right   PLACEMENT OF SETON N/A 09/05/2017  Procedure: PLACEMENT OF SETON;  Surgeon: Leighton Ruff, MD;  Location: [facility];  Service: General;  Laterality: N/A;   RADIOLOGY WITH ANESTHESIA N/A 10/25/2017   Procedure: MRI WITH ANESTHESIA;  Surgeon: Radiologist, Medication, MD;  Location: [place];  Service: Radiology;  Laterality: N/A;   THORACIC DISCECTOMY  1990's    I have reviewed the social history and family history with the patient and they are unchanged from previous note.  ALLERGIES:  is allergic to morphine and related.  MEDICATIONS:  Current Outpatient Medications  Medication Sig Dispense Refill   Ca Carbonate-Mag Hydroxide (ROLAIDS PO) Take 1-2 tablets by mouth 2 (two) times daily as  needed (heartburn).     fexofenadine (ALLEGRA) 180 MG tablet Take 180 mg by mouth at bedtime.     gabapentin (NEURONTIN) 400 MG capsule Take 400-800 mg by mouth See admin instructions. Take 400 mg by mouth in the morning and 800 mg in the evening     insulin detemir (LEVEMIR) 100 UNIT/ML injection Inject 10-62 Units into the skin See admin instructions. Take 62 in the am and 10 in the pm     lactose free nutrition (BOOST PLUS) LIQD Take 237 mLs by mouth 3 (three) times daily with meals. (Patient taking differently: Take 237 mLs by mouth 3 (three) times daily with meals. Ensure chocolate and vanilla max protein) 30 Can 0   levocetirizine (XYZAL) 5 MG tablet Take 5 mg by mouth every evening.     lidocaine-prilocaine (EMLA) cream Apply to port site 1 hour prior to access (Patient taking differently: Apply 1 application topically once. Apply to port site 1 hour prior to access) 30 g 0   Multiple Vitamin (MULTIVITAMIN) tablet Take 1 tablet by mouth daily.     pantoprazole (PROTONIX) 40 MG tablet Take 1 tablet (40 mg total) by mouth 2 (two) times daily. 60 tablet 0   Sodium Chloride Flush (NORMAL SALINE FLUSH) 0.9 % SOLN Inject 10 mLs into the vein daily. 30 Syringe 2   Sodium Chloride Flush (NORMAL SALINE FLUSH) 0.9 % SOLN Inject 10 mLs into the vein daily. 30 Syringe 2   tamsulosin (FLOMAX) 0.4 MG CAPS capsule TAKE 1 CAPSULE(0.4 MG) BY MOUTH DAILY AFTER SUPPER 90 capsule 0   propranolol ER (INDERAL LA) 60 MG 24 hr capsule Take 1 capsule (60 mg total) by mouth daily for 30 days. 30 capsule 0   No current facility-administered medications for this visit.    Facility-Administered Medications Ordered in Other Visits  Medication Dose Route Frequency Provider Last Rate Last Dose   0.9 %  sodium chloride infusion   Intravenous Once Alla Feeling, NP 500 mL/hr at 05/07/18 0944      PHYSICAL EXAMINATION: ECOG PERFORMANCE STATUS: 2 - Symptomatic, <50% confined to bed  Vitals:   05/07/18  0908  BP: 124/81  Pulse: (!) 107  Resp: 18  Temp: 97.6 F (36.4 C)  SpO2: 100%   Filed Weights   05/07/18 0908  Weight: 183 lb 8 oz (83.2 kg)    GENERAL:alert, no distress and comfortable SKIN: skin color, texture, turgor are normal, no rashes or significant lesions EYES: normal, Conjunctiva are pink and non-injected, sclera clear OROPHARYNX:no exudate, no erythema and lips, buccal mucosa, and tongue normal  NECK: supple, thyroid normal size, non-tender, without nodularity LYMPH:  no palpable lymphadenopathy in the cervical, axillary or inguinal LUNGS: clear to auscultation and percussion with normal breathing effort HEART: regular rate & rhythm and no murmurs and no lower  extremity edema ABDOMEN:abdomen soft, non-tender and normal bowel sounds (+) mild leaking at right biliary draining, covered in gauze  Musculoskeletal:no cyanosis of digits and no clubbing  NEURO: alert & oriented x 3 with fluent speech, no focal motor/sensory deficits  LABORATORY DATA:  I have reviewed the data as listed CBC Latest Ref Rng & Units 05/07/2018 05/01/2018 04/29/2018  WBC 4.0 - 10.5 K/uL 11.2(H) 19.6(H) 21.7(H)  Hemoglobin 13.0 - 17.0 g/dL 10.1(L) 10.4(L) 9.9(L)  Hematocrit 39.0 - 52.0 % 30.7(L) 31.3(L) 29.6(L)  Platelets 150 - 400 K/uL 150 166 143(L)     CMP Latest Ref Rng & Units 05/01/2018 04/29/2018 04/23/2018  Glucose 70 - 99 mg/dL 188(H) 220(H) 196(H)  BUN 8 - 23 mg/dL 14 12 14   Creatinine 0.61 - 1.24 mg/dL 0.76 0.77 0.74  Sodium 135 - 145 mmol/L 133(L) 131(L) 127(L)  Potassium 3.5 - 5.1 mmol/L 3.4(L) 3.7 3.9  Chloride 98 - 111 mmol/L 99 99 100  CO2 22 - 32 mmol/L 22 23 19(L)  Calcium 8.9 - 10.3 mg/dL 9.1 8.9 8.9  Total Protein 6.5 - 8.1 g/dL 8.1 7.9 7.9  Total Bilirubin 0.3 - 1.2 mg/dL 0.9 1.1 1.4(H)  Alkaline Phos 38 - 126 U/L 401(H) 413(H) 241(H)  AST 15 - 41 U/L 39 39 42(H)  ALT 0 - 44 U/L 35 35 46(H)      RADIOGRAPHIC STUDIES: I have personally reviewed the radiological  images as listed and agreed with the findings in the report. No results found.   ASSESSMENT & PLAN:  Matthew Lewis is a 63 y.o. male with   1. Metastaticintrahepatic cholangiocarcinomato right adrenal gland, liver, lungs and nodes -Diagnosed in 10/2017. He had 2 biliary drainage placements. His cancer treatment was postponed due to multiple hospitalization for infection, dehydration, it etc. Hewasonfirst lineCisplatin and gemcitabinestopped on 02/20/2018 because his restaging CT scan showed enlarged adrenal glad metastasis consistent disease progression. -He has had multiplehospitalizations since his diagnosis of cancer forinfection, dehydration and GI bleeding -He startedsecond linetreatmentFOLFOXon 03/11/2018 and was only given5-FU and leucovorin on for first cycle due to recent GI bleeding and will continue treatment every 2 weeks.And then Oxaliplatin was added with cycle 2. -Labs reviewed, CBC shows improved and mildly improved, CMP still pending. Ca 19.9 is still pending. Overall adequate to proceed with cycle 5 FOLFOX today  -Plan to do CT scan after cycle 6 chemo.  -f/u in 2 weeks  2.Obstructive jaundice, status post bilateral percutaneous biliary drain -drained tube exchange1/28, both were capped. Due to bilary leakage from left side this morning, the left drain tube is connected to a bag now,it is draining well, and his output has decreased. -f/u with IR -continue monitoring LFTs -He is only mildly jaundiced. He recently had right biliary drain replaced but he continues to have high output leading to persistent leaking at site. This causes him skin irritation. I encouraged him to contact IR as he may need a stitch placed to better hold drain. He is agreeable.  -I will continue to give IV Fluids as needed, given high output.  -I encouraged him to drink 3 liters a day of fluid. He is agreeable.   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  Portal vein thrombosis -He is on aspirin, no anticoagulation due toinitialbloody biliary drainage  5. DM, HTN -f/u with PCP  6.H/o substance abuse and addiction -He's been clean for 6 years now -will becautious about narcoticsandbenzo.  7. Anemia  -Secondary to chemotherapy, recent GI bleedingand  underlying malignancy -He received 5 unit blood transfusion during his hospitalization for GI bleeding, anemia improved. -Hg 10.1 today, no need for blood transfusion. (05/07/18)  8. GIbleeding fromduodenal ulcers -Patient developed upper GI bleeding on February 24, 2018, required hospitalization, and multiple blood transfusion. -Bleeding has stopped and is possibly related to NSAIDs  -Continue Protonix twice daily indefinitelyper GI Dr. Fuller Plan   9.Goal of care discussion, DNR/DNI -Wepreviouslydiscussed code status in length.I recommend DNR/DNI,he agrees. -He understands his cancer is not curable, and the goal of therapy is to prolong his life and improve his quality of life.  10. Lower appetite, weight loss  -I encouraged him to increase calorie and protein in take to about 2000 cal a day  -He will continue Ensure daily -His weight is increasing now.   11. Seasonal Allergies -He fell twice due to dizziness from antihistamine use.  -He is on he has been on antihistamines such as allegra, Fexofenadine and Hydralazine due to allergies. He will hold Hydralazine for now.   12. Dizziness with falls, orthostatic hypotension  -SBP previously dropped to 80's  -likely dehydration from high biliary output, was given IVFs.  -BP at 124/81 today (05/07/18), he denies dizziness  Plan -Labs reviewed, CMP still pending, if no concerns, will proceed with FOLFOX today with Oxaliplatin at 70 mg/m2 -Will give IVF NS 1L today and on 5/2 with pump d/c -CT CAP W contrast in 3 -4 weeks  -Lab, flush,f/uand treatmentin 2 weeks -He will f/u with IR to evaluate his biliary  drainage leakage issue    No problem-specific Assessment & Plan notes found for this encounter.   Orders Placed This Encounter  Procedures   CT Abdomen Pelvis W Contrast    Standing Status:   Future    Standing Expiration Date:   05/07/2019    Order Specific Question:   If indicated for the ordered procedure, I authorize the administration of contrast media per Radiology protocol    Answer:   Yes    Order Specific Question:   Preferred imaging location?    Answer:   [facility]    Order Specific Question:   Is Oral Contrast requested for this exam?    Answer:   Yes, Per Radiology protocol    Order Specific Question:   Radiology Contrast Protocol - do NOT remove file path    Answer:   [email or web address]   CT Chest W Contrast    Standing Status:   Future    Standing Expiration Date:   05/07/2019    Order Specific Question:   If indicated for the ordered procedure, I authorize the administration of contrast media per Radiology protocol    Answer:   Yes    Order Specific Question:   Preferred imaging location?    Answer:   [facility]    Order Specific Question:   Radiology Contrast Protocol - do NOT remove file path    Answer:   [email or web address]   All questions were answered. The patient knows to call the clinic with any problems, questions or concerns. No barriers to learning was detected. I spent 20 minutes counseling the patient face to face. The total time spent in the appointment was 25 minutes and more than 50% was on counseling and review of test results     Matthew [name], MD 05/07/2018   I, Joslyn Devon, am acting as scribe for Matthew [name], MD.   I have reviewed the above documentation for accuracy and completeness, and I  agree with the above.

## 2018-05-07 ENCOUNTER — Inpatient Hospital Stay: Payer: Medicare Other

## 2018-05-07 ENCOUNTER — Other Ambulatory Visit: Payer: Self-pay

## 2018-05-07 ENCOUNTER — Encounter: Payer: Self-pay | Admitting: Hematology

## 2018-05-07 ENCOUNTER — Telehealth: Payer: Self-pay | Admitting: Hematology

## 2018-05-07 ENCOUNTER — Inpatient Hospital Stay (HOSPITAL_BASED_OUTPATIENT_CLINIC_OR_DEPARTMENT_OTHER): Payer: Medicare Other | Admitting: Hematology

## 2018-05-07 VITALS — BP 124/81 | HR 107 | Temp 97.6°F | Resp 18 | Ht 71.0 in | Wt 183.5 lb

## 2018-05-07 VITALS — BP 103/67 | HR 88 | Resp 18

## 2018-05-07 DIAGNOSIS — I959 Hypotension, unspecified: Secondary | ICD-10-CM | POA: Diagnosis not present

## 2018-05-07 DIAGNOSIS — I81 Portal vein thrombosis: Secondary | ICD-10-CM | POA: Diagnosis not present

## 2018-05-07 DIAGNOSIS — C221 Intrahepatic bile duct carcinoma: Secondary | ICD-10-CM

## 2018-05-07 DIAGNOSIS — C7801 Secondary malignant neoplasm of right lung: Secondary | ICD-10-CM

## 2018-05-07 DIAGNOSIS — C7972 Secondary malignant neoplasm of left adrenal gland: Secondary | ICD-10-CM | POA: Diagnosis not present

## 2018-05-07 DIAGNOSIS — Z7189 Other specified counseling: Secondary | ICD-10-CM

## 2018-05-07 DIAGNOSIS — D6481 Anemia due to antineoplastic chemotherapy: Secondary | ICD-10-CM

## 2018-05-07 DIAGNOSIS — E119 Type 2 diabetes mellitus without complications: Secondary | ICD-10-CM

## 2018-05-07 DIAGNOSIS — R42 Dizziness and giddiness: Secondary | ICD-10-CM | POA: Diagnosis not present

## 2018-05-07 DIAGNOSIS — Z794 Long term (current) use of insulin: Secondary | ICD-10-CM | POA: Diagnosis not present

## 2018-05-07 DIAGNOSIS — C7802 Secondary malignant neoplasm of left lung: Secondary | ICD-10-CM | POA: Diagnosis not present

## 2018-05-07 DIAGNOSIS — K7030 Alcoholic cirrhosis of liver without ascites: Secondary | ICD-10-CM

## 2018-05-07 DIAGNOSIS — Z95828 Presence of other vascular implants and grafts: Secondary | ICD-10-CM

## 2018-05-07 DIAGNOSIS — C7971 Secondary malignant neoplasm of right adrenal gland: Secondary | ICD-10-CM

## 2018-05-07 DIAGNOSIS — Z5111 Encounter for antineoplastic chemotherapy: Secondary | ICD-10-CM | POA: Diagnosis not present

## 2018-05-07 DIAGNOSIS — C772 Secondary and unspecified malignant neoplasm of intra-abdominal lymph nodes: Secondary | ICD-10-CM | POA: Diagnosis not present

## 2018-05-07 DIAGNOSIS — Z79899 Other long term (current) drug therapy: Secondary | ICD-10-CM | POA: Diagnosis not present

## 2018-05-07 DIAGNOSIS — I10 Essential (primary) hypertension: Secondary | ICD-10-CM | POA: Diagnosis not present

## 2018-05-07 LAB — CBC WITH DIFFERENTIAL (CANCER CENTER ONLY)
Abs Immature Granulocytes: 0.07 10*3/uL (ref 0.00–0.07)
Basophils Absolute: 0 10*3/uL (ref 0.0–0.1)
Basophils Relative: 0 %
Eosinophils Absolute: 0.3 10*3/uL (ref 0.0–0.5)
Eosinophils Relative: 3 %
HCT: 30.7 % — ABNORMAL LOW (ref 39.0–52.0)
Hemoglobin: 10.1 g/dL — ABNORMAL LOW (ref 13.0–17.0)
Immature Granulocytes: 1 %
Lymphocytes Relative: 9 %
Lymphs Abs: 1 10*3/uL (ref 0.7–4.0)
MCH: 31.9 pg (ref 26.0–34.0)
MCHC: 32.9 g/dL (ref 30.0–36.0)
MCV: 96.8 fL (ref 80.0–100.0)
Monocytes Absolute: 0.9 10*3/uL (ref 0.1–1.0)
Monocytes Relative: 8 %
Neutro Abs: 8.8 10*3/uL — ABNORMAL HIGH (ref 1.7–7.7)
Neutrophils Relative %: 79 %
Platelet Count: 150 10*3/uL (ref 150–400)
RBC: 3.17 MIL/uL — ABNORMAL LOW (ref 4.22–5.81)
RDW: 18.4 % — ABNORMAL HIGH (ref 11.5–15.5)
WBC Count: 11.2 10*3/uL — ABNORMAL HIGH (ref 4.0–10.5)
nRBC: 0 % (ref 0.0–0.2)

## 2018-05-07 LAB — CMP (CANCER CENTER ONLY)
ALT: 34 U/L (ref 0–44)
AST: 40 U/L (ref 15–41)
Albumin: 2.8 g/dL — ABNORMAL LOW (ref 3.5–5.0)
Alkaline Phosphatase: 263 U/L — ABNORMAL HIGH (ref 38–126)
Anion gap: 9 (ref 5–15)
BUN: 13 mg/dL (ref 8–23)
CO2: 24 mmol/L (ref 22–32)
Calcium: 8.8 mg/dL — ABNORMAL LOW (ref 8.9–10.3)
Chloride: 100 mmol/L (ref 98–111)
Creatinine: 0.77 mg/dL (ref 0.61–1.24)
GFR, Est AFR Am: >60 mL/min (ref >60)
GFR, Estimated: >60 mL/min (ref >60)
Glucose, Bld: 244 mg/dL — ABNORMAL HIGH (ref 70–99)
Potassium: 3.4 mmol/L — ABNORMAL LOW (ref 3.5–5.1)
Sodium: 133 mmol/L — ABNORMAL LOW (ref 135–145)
Total Bilirubin: 1 mg/dL (ref 0.3–1.2)
Total Protein: 7.8 g/dL (ref 6.5–8.1)

## 2018-05-07 MED ORDER — SODIUM CHLORIDE 0.9 % IV SOLN
2375.0000 mg/m2 | INTRAVENOUS | Status: DC
  Administered 2018-05-07: 5000 mg via INTRAVENOUS
  Filled 2018-05-07: qty 100

## 2018-05-07 MED ORDER — PALONOSETRON HCL INJECTION 0.25 MG/5ML
0.2500 mg | Freq: Once | INTRAVENOUS | Status: AC
  Administered 2018-05-07: 11:00:00 0.25 mg via INTRAVENOUS

## 2018-05-07 MED ORDER — SODIUM CHLORIDE 0.9% FLUSH
10.0000 mL | INTRAVENOUS | Status: DC | PRN
  Administered 2018-05-07: 09:00:00 10 mL
  Filled 2018-05-07: qty 10

## 2018-05-07 MED ORDER — DEXAMETHASONE SODIUM PHOSPHATE 10 MG/ML IJ SOLN
10.0000 mg | Freq: Once | INTRAMUSCULAR | Status: AC
  Administered 2018-05-07: 10 mg via INTRAVENOUS

## 2018-05-07 MED ORDER — DEXTROSE 5 % IV SOLN
Freq: Once | INTRAVENOUS | Status: AC
  Administered 2018-05-07: 12:00:00 via INTRAVENOUS
  Filled 2018-05-07: qty 250

## 2018-05-07 MED ORDER — LEUCOVORIN CALCIUM INJECTION 350 MG
400.0000 mg/m2 | Freq: Once | INTRAVENOUS | Status: AC
  Administered 2018-05-07: 12:00:00 840 mg via INTRAVENOUS
  Filled 2018-05-07: qty 42

## 2018-05-07 MED ORDER — PALONOSETRON HCL INJECTION 0.25 MG/5ML
INTRAVENOUS | Status: AC
  Filled 2018-05-07: qty 5

## 2018-05-07 MED ORDER — OXALIPLATIN CHEMO INJECTION 100 MG/20ML
71.0000 mg/m2 | Freq: Once | INTRAVENOUS | Status: AC
  Administered 2018-05-07: 150 mg via INTRAVENOUS
  Filled 2018-05-07: qty 10

## 2018-05-07 MED ORDER — DEXAMETHASONE SODIUM PHOSPHATE 10 MG/ML IJ SOLN
INTRAMUSCULAR | Status: AC
  Filled 2018-05-07: qty 1

## 2018-05-07 MED ORDER — SODIUM CHLORIDE 0.9 % IV SOLN
Freq: Once | INTRAVENOUS | Status: AC
  Administered 2018-05-07: 10:00:00 via INTRAVENOUS
  Filled 2018-05-07: qty 250

## 2018-05-07 NOTE — Patient Instructions (Signed)
Coronavirus (COVID-19) Are you at risk?  Are you at risk for the Coronavirus (COVID-19)?  To be considered HIGH RISK for Coronavirus (COVID-19), you have to meet the following criteria:  . Traveled to [place], [place], [place], [place] or [place]; or in the [place] to [place], [place], [place], or [place]; and have fever, cough, and shortness of breath within the last 2 weeks of travel OR . Been in close contact with a person diagnosed with COVID-19 within the last 2 weeks and have fever, cough, and shortness of breath . IF YOU DO NOT MEET THESE CRITERIA, YOU ARE CONSIDERED LOW RISK FOR COVID-19.  What to do if you are HIGH RISK for COVID-19?  . If you are having a medical emergency, call 911. . Seek medical care right away. Before you go to a doctor's office, urgent care or emergency department, call ahead and tell them about your recent travel, contact with someone diagnosed with COVID-19, and your symptoms. You should receive instructions from your physician's office regarding next steps of care.  . When you arrive at healthcare provider, tell the healthcare staff immediately you have returned from visiting [place], [place], [place], [place] or [place]; or traveled in the [place] to [place], [place], [place], or [place]; in the last two weeks or you have been in close contact with a person diagnosed with COVID-19 in the last 2 weeks.   . Tell the health care staff about your symptoms: fever, cough and shortness of breath. . After you have been seen by a medical provider, you will be either: o Tested for (COVID-19) and discharged home on quarantine except to seek medical care if symptoms worsen, and asked to  - Stay home and avoid contact with others until you get your results (4-5 days)  - Avoid travel on public transportation if possible (such as bus, train, or airplane) or o Sent to the Emergency Department by EMS for evaluation, COVID-19 testing, and possible  admission depending on your condition and test results.  What to do if you are LOW RISK for COVID-19?  Reduce your risk of any infection by using the same precautions used for avoiding the common cold or flu:  . Wash your hands often with soap and warm water for at least 20 seconds.  If soap and water are not readily available, use an alcohol-based hand sanitizer with at least 60% alcohol.  . If coughing or sneezing, cover your mouth and nose by coughing or sneezing into the elbow areas of your shirt or coat, into a tissue or into your sleeve (not your hands). . Avoid shaking hands with others and consider head nods or verbal greetings only. . Avoid touching your eyes, nose, or mouth with unwashed hands.  . Avoid close contact with people who are sick. . Avoid places or events with large numbers of people in one location, like concerts or sporting events. . Carefully consider travel plans you have or are making. . If you are planning any travel outside or inside the [place], visit the CDC's Travelers' Health webpage for the latest health notices. . If you have some symptoms but not all symptoms, continue to monitor at home and seek medical attention if your symptoms worsen. . If you are having a medical emergency, call 911.   ADDITIONAL HEALTHCARE OPTIONS FOR PATIENTS  [facility] / e-Visit: [email or web address]         [facility]: [phone]  [place]: [facility]: [place] Discharge Instructions for Patients Receiving Chemotherapy  Today you received the following chemotherapy agents Oxaliplatin, Leucovorin and Adrucil   To help prevent nausea and vomiting after your treatment, we encourage you to take your nausea medication as directed.    If you develop nausea and vomiting that is not controlled by your nausea medication, call  the clinic.   BELOW ARE SYMPTOMS THAT SHOULD BE REPORTED IMMEDIATELY:  *FEVER GREATER THAN 100.5 F  *CHILLS WITH OR WITHOUT FEVER  NAUSEA AND VOMITING THAT IS NOT CONTROLLED WITH YOUR NAUSEA MEDICATION  *UNUSUAL SHORTNESS OF BREATH  *UNUSUAL BRUISING OR BLEEDING  TENDERNESS IN MOUTH AND THROAT WITH OR WITHOUT PRESENCE OF ULCERS  *URINARY PROBLEMS  *BOWEL PROBLEMS  UNUSUAL RASH Items with * indicate a potential emergency and should be followed up as soon as possible.  Feel free to call the clinic should you have any questions or concerns. The clinic phone number is [phone].  Please show the [place] at check-in to the Emergency Department and triage nurse.

## 2018-05-07 NOTE — Telephone Encounter (Signed)
Scheduled appt per 4/30 los.

## 2018-05-08 LAB — CANCER ANTIGEN 19-9: CA 19-9: 25 U/mL (ref 0–35)

## 2018-05-09 ENCOUNTER — Inpatient Hospital Stay: Payer: Medicare Other | Attending: Hematology

## 2018-05-09 ENCOUNTER — Other Ambulatory Visit: Payer: Self-pay

## 2018-05-09 ENCOUNTER — Inpatient Hospital Stay: Payer: Medicare Other

## 2018-05-09 VITALS — BP 108/71 | HR 100 | Temp 98.4°F | Resp 18

## 2018-05-09 DIAGNOSIS — S3992XA Unspecified injury of lower back, initial encounter: Secondary | ICD-10-CM | POA: Diagnosis not present

## 2018-05-09 DIAGNOSIS — Z1159 Encounter for screening for other viral diseases: Secondary | ICD-10-CM | POA: Diagnosis not present

## 2018-05-09 DIAGNOSIS — E871 Hypo-osmolality and hyponatremia: Secondary | ICD-10-CM | POA: Diagnosis not present

## 2018-05-09 DIAGNOSIS — D539 Nutritional anemia, unspecified: Secondary | ICD-10-CM | POA: Diagnosis not present

## 2018-05-09 DIAGNOSIS — Z03818 Encounter for observation for suspected exposure to other biological agents ruled out: Secondary | ICD-10-CM | POA: Diagnosis not present

## 2018-05-09 DIAGNOSIS — C221 Intrahepatic bile duct carcinoma: Secondary | ICD-10-CM | POA: Diagnosis not present

## 2018-05-09 DIAGNOSIS — I9589 Other hypotension: Secondary | ICD-10-CM | POA: Diagnosis not present

## 2018-05-09 DIAGNOSIS — Z452 Encounter for adjustment and management of vascular access device: Secondary | ICD-10-CM | POA: Diagnosis not present

## 2018-05-09 DIAGNOSIS — C7971 Secondary malignant neoplasm of right adrenal gland: Secondary | ICD-10-CM | POA: Diagnosis not present

## 2018-05-09 DIAGNOSIS — K2210 Ulcer of esophagus without bleeding: Secondary | ICD-10-CM | POA: Diagnosis not present

## 2018-05-09 DIAGNOSIS — E440 Moderate protein-calorie malnutrition: Secondary | ICD-10-CM | POA: Diagnosis not present

## 2018-05-09 DIAGNOSIS — R55 Syncope and collapse: Secondary | ICD-10-CM | POA: Diagnosis not present

## 2018-05-09 DIAGNOSIS — K3189 Other diseases of stomach and duodenum: Secondary | ICD-10-CM | POA: Diagnosis not present

## 2018-05-09 DIAGNOSIS — Z7189 Other specified counseling: Secondary | ICD-10-CM

## 2018-05-09 DIAGNOSIS — C779 Secondary and unspecified malignant neoplasm of lymph node, unspecified: Secondary | ICD-10-CM | POA: Diagnosis not present

## 2018-05-09 DIAGNOSIS — R571 Hypovolemic shock: Secondary | ICD-10-CM | POA: Diagnosis not present

## 2018-05-09 DIAGNOSIS — R578 Other shock: Secondary | ICD-10-CM | POA: Diagnosis not present

## 2018-05-09 DIAGNOSIS — D630 Anemia in neoplastic disease: Secondary | ICD-10-CM | POA: Diagnosis not present

## 2018-05-09 DIAGNOSIS — D6481 Anemia due to antineoplastic chemotherapy: Secondary | ICD-10-CM | POA: Diagnosis not present

## 2018-05-09 DIAGNOSIS — K269 Duodenal ulcer, unspecified as acute or chronic, without hemorrhage or perforation: Secondary | ICD-10-CM | POA: Diagnosis not present

## 2018-05-09 DIAGNOSIS — C7801 Secondary malignant neoplasm of right lung: Secondary | ICD-10-CM | POA: Diagnosis not present

## 2018-05-09 DIAGNOSIS — D62 Acute posthemorrhagic anemia: Secondary | ICD-10-CM | POA: Diagnosis not present

## 2018-05-09 DIAGNOSIS — S199XXA Unspecified injury of neck, initial encounter: Secondary | ICD-10-CM | POA: Diagnosis not present

## 2018-05-09 DIAGNOSIS — R51 Headache: Secondary | ICD-10-CM | POA: Diagnosis not present

## 2018-05-09 DIAGNOSIS — I8511 Secondary esophageal varices with bleeding: Secondary | ICD-10-CM | POA: Diagnosis not present

## 2018-05-09 DIAGNOSIS — Z66 Do not resuscitate: Secondary | ICD-10-CM | POA: Diagnosis not present

## 2018-05-09 DIAGNOSIS — C7802 Secondary malignant neoplasm of left lung: Secondary | ICD-10-CM | POA: Diagnosis not present

## 2018-05-09 DIAGNOSIS — I81 Portal vein thrombosis: Secondary | ICD-10-CM | POA: Diagnosis not present

## 2018-05-09 DIAGNOSIS — K766 Portal hypertension: Secondary | ICD-10-CM | POA: Diagnosis not present

## 2018-05-09 DIAGNOSIS — E1142 Type 2 diabetes mellitus with diabetic polyneuropathy: Secondary | ICD-10-CM | POA: Diagnosis not present

## 2018-05-09 MED ORDER — PEGFILGRASTIM INJECTION 6 MG/0.6ML ~~LOC~~
6.0000 mg | PREFILLED_SYRINGE | Freq: Once | SUBCUTANEOUS | Status: AC
  Administered 2018-05-09: 6 mg via SUBCUTANEOUS

## 2018-05-09 MED ORDER — HEPARIN SOD (PORK) LOCK FLUSH 100 UNIT/ML IV SOLN
500.0000 [IU] | Freq: Once | INTRAVENOUS | Status: AC | PRN
  Administered 2018-05-09: 500 [IU]
  Filled 2018-05-09: qty 5

## 2018-05-09 MED ORDER — SODIUM CHLORIDE 0.9 % IV SOLN
INTRAVENOUS | Status: AC
  Administered 2018-05-09: 12:00:00 via INTRAVENOUS
  Filled 2018-05-09 (×2): qty 250

## 2018-05-09 MED ORDER — PEGFILGRASTIM INJECTION 6 MG/0.6ML ~~LOC~~
PREFILLED_SYRINGE | SUBCUTANEOUS | Status: AC
  Filled 2018-05-09: qty 0.6

## 2018-05-09 MED ORDER — SODIUM CHLORIDE 0.9% FLUSH
10.0000 mL | INTRAVENOUS | Status: DC | PRN
  Administered 2018-05-09: 10 mL
  Filled 2018-05-09: qty 10

## 2018-05-09 NOTE — Patient Instructions (Signed)

## 2018-05-12 ENCOUNTER — Other Ambulatory Visit: Payer: Self-pay

## 2018-05-12 ENCOUNTER — Encounter (HOSPITAL_COMMUNITY)
Admission: EM | Disposition: A | Discharge status: Home / Self Care | Payer: Self-pay | Source: Home / Self Care | Attending: Internal Medicine

## 2018-05-12 ENCOUNTER — Inpatient Hospital Stay (HOSPITAL_COMMUNITY)
Admission: EM | Admit: 2018-05-12 | Discharge: 2018-05-17 | DRG: 441 | Disposition: A | Discharge status: Home / Self Care | Payer: Medicare Other | Attending: Internal Medicine | Admitting: Internal Medicine

## 2018-05-12 ENCOUNTER — Emergency Department (HOSPITAL_COMMUNITY): Payer: Medicare Other

## 2018-05-12 ENCOUNTER — Encounter (HOSPITAL_COMMUNITY): Payer: Self-pay | Admitting: Emergency Medicine

## 2018-05-12 DIAGNOSIS — C7801 Secondary malignant neoplasm of right lung: Secondary | ICD-10-CM | POA: Diagnosis present

## 2018-05-12 DIAGNOSIS — K766 Portal hypertension: Secondary | ICD-10-CM | POA: Diagnosis present

## 2018-05-12 DIAGNOSIS — K2210 Ulcer of esophagus without bleeding: Secondary | ICD-10-CM | POA: Diagnosis present

## 2018-05-12 DIAGNOSIS — K922 Gastrointestinal hemorrhage, unspecified: Secondary | ICD-10-CM | POA: Diagnosis not present

## 2018-05-12 DIAGNOSIS — I8511 Secondary esophageal varices with bleeding: Secondary | ICD-10-CM | POA: Diagnosis not present

## 2018-05-12 DIAGNOSIS — R42 Dizziness and giddiness: Secondary | ICD-10-CM | POA: Diagnosis not present

## 2018-05-12 DIAGNOSIS — I9589 Other hypotension: Secondary | ICD-10-CM | POA: Diagnosis not present

## 2018-05-12 DIAGNOSIS — E871 Hypo-osmolality and hyponatremia: Secondary | ICD-10-CM | POA: Diagnosis present

## 2018-05-12 DIAGNOSIS — S199XXA Unspecified injury of neck, initial encounter: Secondary | ICD-10-CM | POA: Diagnosis not present

## 2018-05-12 DIAGNOSIS — S3992XA Unspecified injury of lower back, initial encounter: Secondary | ICD-10-CM | POA: Diagnosis not present

## 2018-05-12 DIAGNOSIS — R000 Tachycardia, unspecified: Secondary | ICD-10-CM | POA: Diagnosis not present

## 2018-05-12 DIAGNOSIS — K2970 Gastritis, unspecified, without bleeding: Secondary | ICD-10-CM | POA: Diagnosis not present

## 2018-05-12 DIAGNOSIS — E119 Type 2 diabetes mellitus without complications: Secondary | ICD-10-CM

## 2018-05-12 DIAGNOSIS — C787 Secondary malignant neoplasm of liver and intrahepatic bile duct: Secondary | ICD-10-CM | POA: Diagnosis present

## 2018-05-12 DIAGNOSIS — C799 Secondary malignant neoplasm of unspecified site: Secondary | ICD-10-CM | POA: Diagnosis not present

## 2018-05-12 DIAGNOSIS — G894 Chronic pain syndrome: Secondary | ICD-10-CM | POA: Diagnosis present

## 2018-05-12 DIAGNOSIS — Z8249 Family history of ischemic heart disease and other diseases of the circulatory system: Secondary | ICD-10-CM

## 2018-05-12 DIAGNOSIS — K269 Duodenal ulcer, unspecified as acute or chronic, without hemorrhage or perforation: Secondary | ICD-10-CM | POA: Diagnosis present

## 2018-05-12 DIAGNOSIS — Z9689 Presence of other specified functional implants: Secondary | ICD-10-CM | POA: Diagnosis present

## 2018-05-12 DIAGNOSIS — C801 Malignant (primary) neoplasm, unspecified: Secondary | ICD-10-CM | POA: Diagnosis present

## 2018-05-12 DIAGNOSIS — E1142 Type 2 diabetes mellitus with diabetic polyneuropathy: Secondary | ICD-10-CM | POA: Diagnosis present

## 2018-05-12 DIAGNOSIS — C221 Intrahepatic bile duct carcinoma: Secondary | ICD-10-CM | POA: Diagnosis present

## 2018-05-12 DIAGNOSIS — R6510 Systemic inflammatory response syndrome (SIRS) of non-infectious origin without acute organ dysfunction: Secondary | ICD-10-CM | POA: Diagnosis not present

## 2018-05-12 DIAGNOSIS — E440 Moderate protein-calorie malnutrition: Secondary | ICD-10-CM | POA: Diagnosis present

## 2018-05-12 DIAGNOSIS — Z66 Do not resuscitate: Secondary | ICD-10-CM | POA: Diagnosis present

## 2018-05-12 DIAGNOSIS — K7030 Alcoholic cirrhosis of liver without ascites: Secondary | ICD-10-CM | POA: Diagnosis present

## 2018-05-12 DIAGNOSIS — K3189 Other diseases of stomach and duodenum: Secondary | ICD-10-CM | POA: Diagnosis present

## 2018-05-12 DIAGNOSIS — C7970 Secondary malignant neoplasm of unspecified adrenal gland: Secondary | ICD-10-CM | POA: Diagnosis present

## 2018-05-12 DIAGNOSIS — Z9049 Acquired absence of other specified parts of digestive tract: Secondary | ICD-10-CM

## 2018-05-12 DIAGNOSIS — D62 Acute posthemorrhagic anemia: Secondary | ICD-10-CM | POA: Diagnosis not present

## 2018-05-12 DIAGNOSIS — R55 Syncope and collapse: Secondary | ICD-10-CM

## 2018-05-12 DIAGNOSIS — Z03818 Encounter for observation for suspected exposure to other biological agents ruled out: Secondary | ICD-10-CM | POA: Diagnosis not present

## 2018-05-12 DIAGNOSIS — C7802 Secondary malignant neoplasm of left lung: Secondary | ICD-10-CM | POA: Diagnosis present

## 2018-05-12 DIAGNOSIS — R578 Other shock: Secondary | ICD-10-CM | POA: Diagnosis present

## 2018-05-12 DIAGNOSIS — K920 Hematemesis: Secondary | ICD-10-CM

## 2018-05-12 DIAGNOSIS — I959 Hypotension, unspecified: Secondary | ICD-10-CM | POA: Diagnosis not present

## 2018-05-12 DIAGNOSIS — N320 Bladder-neck obstruction: Secondary | ICD-10-CM | POA: Diagnosis present

## 2018-05-12 DIAGNOSIS — D6481 Anemia due to antineoplastic chemotherapy: Secondary | ICD-10-CM | POA: Diagnosis present

## 2018-05-12 DIAGNOSIS — D539 Nutritional anemia, unspecified: Secondary | ICD-10-CM | POA: Diagnosis present

## 2018-05-12 DIAGNOSIS — I81 Portal vein thrombosis: Secondary | ICD-10-CM | POA: Diagnosis not present

## 2018-05-12 DIAGNOSIS — I10 Essential (primary) hypertension: Secondary | ICD-10-CM | POA: Diagnosis not present

## 2018-05-12 DIAGNOSIS — R571 Hypovolemic shock: Secondary | ICD-10-CM

## 2018-05-12 DIAGNOSIS — Z1159 Encounter for screening for other viral diseases: Secondary | ICD-10-CM | POA: Diagnosis not present

## 2018-05-12 DIAGNOSIS — Z981 Arthrodesis status: Secondary | ICD-10-CM

## 2018-05-12 DIAGNOSIS — T451X5A Adverse effect of antineoplastic and immunosuppressive drugs, initial encounter: Secondary | ICD-10-CM | POA: Diagnosis present

## 2018-05-12 DIAGNOSIS — I8500 Esophageal varices without bleeding: Secondary | ICD-10-CM | POA: Diagnosis not present

## 2018-05-12 DIAGNOSIS — Z452 Encounter for adjustment and management of vascular access device: Secondary | ICD-10-CM | POA: Diagnosis not present

## 2018-05-12 DIAGNOSIS — C779 Secondary and unspecified malignant neoplasm of lymph node, unspecified: Secondary | ICD-10-CM | POA: Diagnosis present

## 2018-05-12 DIAGNOSIS — D630 Anemia in neoplastic disease: Secondary | ICD-10-CM | POA: Diagnosis present

## 2018-05-12 DIAGNOSIS — I2510 Atherosclerotic heart disease of native coronary artery without angina pectoris: Secondary | ICD-10-CM | POA: Diagnosis present

## 2018-05-12 DIAGNOSIS — M5489 Other dorsalgia: Secondary | ICD-10-CM | POA: Diagnosis not present

## 2018-05-12 DIAGNOSIS — Z9861 Coronary angioplasty status: Secondary | ICD-10-CM

## 2018-05-12 DIAGNOSIS — Z6829 Body mass index (BMI) 29.0-29.9, adult: Secondary | ICD-10-CM

## 2018-05-12 DIAGNOSIS — K209 Esophagitis, unspecified: Secondary | ICD-10-CM | POA: Diagnosis not present

## 2018-05-12 DIAGNOSIS — Z79899 Other long term (current) drug therapy: Secondary | ICD-10-CM

## 2018-05-12 DIAGNOSIS — Z87891 Personal history of nicotine dependence: Secondary | ICD-10-CM

## 2018-05-12 DIAGNOSIS — I8501 Esophageal varices with bleeding: Secondary | ICD-10-CM

## 2018-05-12 DIAGNOSIS — F1011 Alcohol abuse, in remission: Secondary | ICD-10-CM | POA: Diagnosis present

## 2018-05-12 DIAGNOSIS — C7971 Secondary malignant neoplasm of right adrenal gland: Secondary | ICD-10-CM | POA: Diagnosis present

## 2018-05-12 DIAGNOSIS — K831 Obstruction of bile duct: Secondary | ICD-10-CM | POA: Diagnosis not present

## 2018-05-12 DIAGNOSIS — Z885 Allergy status to narcotic agent status: Secondary | ICD-10-CM

## 2018-05-12 DIAGNOSIS — Z9221 Personal history of antineoplastic chemotherapy: Secondary | ICD-10-CM

## 2018-05-12 DIAGNOSIS — Z794 Long term (current) use of insulin: Secondary | ICD-10-CM | POA: Diagnosis not present

## 2018-05-12 DIAGNOSIS — E860 Dehydration: Secondary | ICD-10-CM

## 2018-05-12 DIAGNOSIS — R51 Headache: Secondary | ICD-10-CM | POA: Diagnosis not present

## 2018-05-12 HISTORY — PX: ESOPHAGOGASTRODUODENOSCOPY: SHX5428

## 2018-05-12 HISTORY — PX: ESOPHAGEAL BANDING: SHX5518

## 2018-05-12 LAB — CBC WITH DIFFERENTIAL/PLATELET
Abs Immature Granulocytes: 3.05 10*3/uL — ABNORMAL HIGH (ref 0.00–0.07)
Basophils Absolute: 0.1 10*3/uL (ref 0.0–0.1)
Basophils Relative: 0 %
Eosinophils Absolute: 0.3 10*3/uL (ref 0.0–0.5)
Eosinophils Relative: 1 %
HCT: 24.2 % — ABNORMAL LOW (ref 39.0–52.0)
Hemoglobin: 7.7 g/dL — ABNORMAL LOW (ref 13.0–17.0)
Immature Granulocytes: 7 %
Lymphocytes Relative: 4 %
Lymphs Abs: 1.7 10*3/uL (ref 0.7–4.0)
MCH: 33.2 pg (ref 26.0–34.0)
MCHC: 31.8 g/dL (ref 30.0–36.0)
MCV: 104.3 fL — ABNORMAL HIGH (ref 80.0–100.0)
Monocytes Absolute: 0.9 10*3/uL (ref 0.1–1.0)
Monocytes Relative: 2 %
Neutro Abs: 40.5 10*3/uL — ABNORMAL HIGH (ref 1.7–7.7)
Neutrophils Relative %: 86 %
Platelets: 181 10*3/uL (ref 150–400)
RBC: 2.32 MIL/uL — ABNORMAL LOW (ref 4.22–5.81)
RDW: 18.7 % — ABNORMAL HIGH (ref 11.5–15.5)
WBC: 46.5 10*3/uL — ABNORMAL HIGH (ref 4.0–10.5)
nRBC: 0 % (ref 0.0–0.2)

## 2018-05-12 LAB — FIBRINOGEN: Fibrinogen: 328 mg/dL (ref 210–475)

## 2018-05-12 LAB — URINALYSIS, ROUTINE W REFLEX MICROSCOPIC
Bilirubin Urine: NEGATIVE
Glucose, UA: NEGATIVE mg/dL
Hgb urine dipstick: NEGATIVE
Ketones, ur: 5 mg/dL — AB
Leukocytes,Ua: NEGATIVE
Nitrite: NEGATIVE
Protein, ur: NEGATIVE mg/dL
Specific Gravity, Urine: 1.016 (ref 1.005–1.030)
pH: 5 (ref 5.0–8.0)

## 2018-05-12 LAB — COMPREHENSIVE METABOLIC PANEL
ALT: 35 U/L (ref 0–44)
AST: 44 U/L — ABNORMAL HIGH (ref 15–41)
Albumin: 2.3 g/dL — ABNORMAL LOW (ref 3.5–5.0)
Alkaline Phosphatase: 206 U/L — ABNORMAL HIGH (ref 38–126)
Anion gap: 9 (ref 5–15)
BUN: 21 mg/dL (ref 8–23)
CO2: 20 mmol/L — ABNORMAL LOW (ref 22–32)
Calcium: 7.7 mg/dL — ABNORMAL LOW (ref 8.9–10.3)
Chloride: 103 mmol/L (ref 98–111)
Creatinine, Ser: 0.7 mg/dL (ref 0.61–1.24)
GFR calc Af Amer: >60 mL/min (ref >60)
GFR calc non Af Amer: >60 mL/min (ref >60)
Glucose, Bld: 172 mg/dL — ABNORMAL HIGH (ref 70–99)
Potassium: 4.5 mmol/L (ref 3.5–5.1)
Sodium: 132 mmol/L — ABNORMAL LOW (ref 135–145)
Total Bilirubin: 1.1 mg/dL (ref 0.3–1.2)
Total Protein: 5.9 g/dL — ABNORMAL LOW (ref 6.5–8.1)

## 2018-05-12 LAB — PROTIME-INR
INR: 1.3 — ABNORMAL HIGH (ref 0.8–1.2)
INR: 1.7 — ABNORMAL HIGH (ref 0.8–1.2)
Prothrombin Time: 16.3 seconds — ABNORMAL HIGH (ref 11.4–15.2)
Prothrombin Time: 19.7 seconds — ABNORMAL HIGH (ref 11.4–15.2)

## 2018-05-12 LAB — PREPARE RBC (CROSSMATCH)

## 2018-05-12 LAB — IRON AND TIBC
Iron: 97 ug/dL (ref 45–182)
Saturation Ratios: 36 % (ref 17.9–39.5)
TIBC: 267 ug/dL (ref 250–450)
UIBC: 170 ug/dL

## 2018-05-12 LAB — HEMOGLOBIN AND HEMATOCRIT, BLOOD
HCT: 15.9 % — ABNORMAL LOW (ref 39.0–52.0)
Hemoglobin: 5 g/dL — CL (ref 13.0–17.0)

## 2018-05-12 LAB — LACTIC ACID, PLASMA
Lactic Acid, Venous: 3.6 mmol/L (ref 0.5–1.9)
Lactic Acid, Venous: 2.1 mmol/L (ref 0.5–1.9)

## 2018-05-12 LAB — MRSA PCR SCREENING: MRSA by PCR: NEGATIVE

## 2018-05-12 LAB — GLUCOSE, CAPILLARY
Glucose-Capillary: 158 mg/dL — ABNORMAL HIGH (ref 70–99)
Glucose-Capillary: 164 mg/dL — ABNORMAL HIGH (ref 70–99)
Glucose-Capillary: 138 mg/dL — ABNORMAL HIGH (ref 70–99)
Glucose-Capillary: 156 mg/dL — ABNORMAL HIGH (ref 70–99)
Glucose-Capillary: 155 mg/dL — ABNORMAL HIGH (ref 70–99)

## 2018-05-12 LAB — RETICULOCYTES
Immature Retic Fract: 13.1 % (ref 2.3–15.9)
RBC.: 2.12 MIL/uL — ABNORMAL LOW (ref 4.22–5.81)
Retic Count, Absolute: 15.3 10*3/uL — ABNORMAL LOW (ref 19.0–186.0)
Retic Ct Pct: 0.7 % (ref 0.4–3.1)

## 2018-05-12 LAB — FOLATE: Folate: 32 ng/mL (ref >5.9)

## 2018-05-12 LAB — VITAMIN B12: Vitamin B-12: 3030 pg/mL — ABNORMAL HIGH (ref 180–914)

## 2018-05-12 LAB — SARS CORONAVIRUS 2 BY RT PCR (HOSPITAL ORDER, PERFORMED IN ~~LOC~~ HOSPITAL LAB): SARS Coronavirus 2: NEGATIVE

## 2018-05-12 LAB — FERRITIN: Ferritin: 190 ng/mL (ref 24–336)

## 2018-05-12 LAB — TROPONIN I: Troponin I: <0.03 ng/mL (ref <0.03)

## 2018-05-12 LAB — APTT: aPTT: 36 seconds (ref 24–36)

## 2018-05-12 SURGERY — EGD (ESOPHAGOGASTRODUODENOSCOPY)
Anesthesia: Moderate Sedation

## 2018-05-12 MED ORDER — SODIUM CHLORIDE 0.9 % IV SOLN: INTRAVENOUS | Status: DC

## 2018-05-12 MED ORDER — METOCLOPRAMIDE HCL 5 MG/ML IJ SOLN
10.0000 mg | Freq: Once | INTRAMUSCULAR | Status: AC
  Administered 2018-05-13: 10 mg via INTRAVENOUS
  Filled 2018-05-12: qty 2

## 2018-05-12 MED ORDER — SODIUM CHLORIDE 0.9% FLUSH
10.0000 mL | Freq: Two times a day (BID) | INTRAVENOUS | Status: DC
  Administered 2018-05-13 – 2018-05-17 (×9): 10 mL

## 2018-05-12 MED ORDER — SODIUM CHLORIDE 0.9% IV SOLUTION: Freq: Once | INTRAVENOUS | Status: DC

## 2018-05-12 MED ORDER — INSULIN ASPART 100 UNIT/ML ~~LOC~~ SOLN
0.0000 [IU] | Freq: Every day | SUBCUTANEOUS | Status: DC
  Administered 2018-05-13 – 2018-05-14 (×2): 3 [IU] via SUBCUTANEOUS
  Administered 2018-05-15: 22:00:00 2 [IU] via SUBCUTANEOUS

## 2018-05-12 MED ORDER — FENTANYL CITRATE (PF) 100 MCG/2ML IJ SOLN
25.0000 ug | INTRAMUSCULAR | Status: DC | PRN
  Administered 2018-05-12: 50 ug via INTRAVENOUS
  Filled 2018-05-12: qty 2

## 2018-05-12 MED ORDER — SODIUM CHLORIDE 0.9 % IV BOLUS
500.0000 mL | Freq: Once | INTRAVENOUS | Status: AC
  Administered 2018-05-12: 500 mL via INTRAVENOUS

## 2018-05-12 MED ORDER — ONDANSETRON HCL 4 MG PO TABS: 4.0000 mg | ORAL_TABLET | Freq: Four times a day (QID) | ORAL | Status: DC | PRN

## 2018-05-12 MED ORDER — POLYETHYLENE GLYCOL 3350 17 G PO PACK: 17.0000 g | PACK | Freq: Every day | ORAL | Status: DC | PRN

## 2018-05-12 MED ORDER — SODIUM CHLORIDE 0.9 % IV SOLN
250.0000 mL | INTRAVENOUS | Status: DC | PRN
  Administered 2018-05-12: 250 mL via INTRAVENOUS

## 2018-05-12 MED ORDER — MIDAZOLAM HCL 2 MG/2ML IJ SOLN
INTRAMUSCULAR | Status: AC
  Filled 2018-05-12: qty 4

## 2018-05-12 MED ORDER — ACETAMINOPHEN 650 MG RE SUPP: 650.0000 mg | Freq: Four times a day (QID) | RECTAL | Status: DC | PRN

## 2018-05-12 MED ORDER — SODIUM CHLORIDE 0.9 % IV SOLN
INTRAVENOUS | Status: DC
  Administered 2018-05-12: 06:00:00 via INTRAVENOUS

## 2018-05-12 MED ORDER — SODIUM CHLORIDE 0.9% FLUSH: 3.0000 mL | INTRAVENOUS | Status: DC | PRN

## 2018-05-12 MED ORDER — METRONIDAZOLE IN NACL 5-0.79 MG/ML-% IV SOLN
500.0000 mg | Freq: Once | INTRAVENOUS | Status: AC
  Administered 2018-05-12: 500 mg via INTRAVENOUS
  Filled 2018-05-12: qty 100

## 2018-05-12 MED ORDER — SODIUM CHLORIDE 0.9 % IV SOLN
1.0000 g | Freq: Three times a day (TID) | INTRAVENOUS | Status: DC
  Administered 2018-05-12 – 2018-05-13 (×5): 1 g via INTRAVENOUS
  Filled 2018-05-12 (×6): qty 1

## 2018-05-12 MED ORDER — DIPHENHYDRAMINE HCL 50 MG/ML IJ SOLN
INTRAMUSCULAR | Status: AC
  Filled 2018-05-12: qty 1

## 2018-05-12 MED ORDER — METRONIDAZOLE IN NACL 5-0.79 MG/ML-% IV SOLN
500.0000 mg | Freq: Three times a day (TID) | INTRAVENOUS | Status: DC
  Administered 2018-05-12 – 2018-05-13 (×2): 500 mg via INTRAVENOUS
  Filled 2018-05-12 (×2): qty 100

## 2018-05-12 MED ORDER — VANCOMYCIN HCL 10 G IV SOLR
1500.0000 mg | Freq: Once | INTRAVENOUS | Status: AC
  Administered 2018-05-12: 1500 mg via INTRAVENOUS
  Filled 2018-05-12: qty 1500

## 2018-05-12 MED ORDER — KETOROLAC TROMETHAMINE 30 MG/ML IJ SOLN
30.0000 mg | Freq: Four times a day (QID) | INTRAMUSCULAR | Status: DC | PRN
  Administered 2018-05-12 (×2): 30 mg via INTRAVENOUS
  Filled 2018-05-12 (×2): qty 1

## 2018-05-12 MED ORDER — ALBUMIN HUMAN 5 % IV SOLN
25.0000 g | Freq: Once | INTRAVENOUS | Status: AC
  Administered 2018-05-12: 25 g via INTRAVENOUS
  Filled 2018-05-12: qty 500

## 2018-05-12 MED ORDER — SODIUM CHLORIDE 0.9 % IV BOLUS
1000.0000 mL | Freq: Once | INTRAVENOUS | Status: AC
  Administered 2018-05-12: 04:00:00 1000 mL via INTRAVENOUS

## 2018-05-12 MED ORDER — SODIUM CHLORIDE 0.9% FLUSH: 10.0000 mL | INTRAVENOUS | Status: DC | PRN

## 2018-05-12 MED ORDER — LIDOCAINE 5 % EX PTCH
1.0000 | MEDICATED_PATCH | CUTANEOUS | Status: DC
  Administered 2018-05-12 – 2018-05-17 (×7): 1 via TRANSDERMAL
  Filled 2018-05-12 (×6): qty 1

## 2018-05-12 MED ORDER — SODIUM CHLORIDE 0.9 % IV SOLN
Freq: Once | INTRAVENOUS | Status: AC
  Administered 2018-05-12: 14:00:00 500 mL via INTRAVENOUS

## 2018-05-12 MED ORDER — CHLORHEXIDINE GLUCONATE CLOTH 2 % EX PADS
6.0000 | MEDICATED_PAD | Freq: Every day | CUTANEOUS | Status: DC
  Administered 2018-05-13: 6 via TOPICAL

## 2018-05-12 MED ORDER — SODIUM CHLORIDE 0.9% FLUSH
3.0000 mL | Freq: Two times a day (BID) | INTRAVENOUS | Status: DC
  Administered 2018-05-13 – 2018-05-15 (×3): 3 mL via INTRAVENOUS

## 2018-05-12 MED ORDER — SODIUM CHLORIDE 0.9% FLUSH
10.0000 mL | Freq: Two times a day (BID) | INTRAVENOUS | Status: DC
  Administered 2018-05-13: 10 mL

## 2018-05-12 MED ORDER — ONDANSETRON HCL 4 MG/2ML IJ SOLN
4.0000 mg | Freq: Four times a day (QID) | INTRAMUSCULAR | Status: DC | PRN
  Administered 2018-05-12 – 2018-05-14 (×3): 4 mg via INTRAVENOUS
  Filled 2018-05-12 (×2): qty 2

## 2018-05-12 MED ORDER — FENTANYL CITRATE (PF) 100 MCG/2ML IJ SOLN
INTRAMUSCULAR | Status: AC
  Filled 2018-05-12: qty 4

## 2018-05-12 MED ORDER — SODIUM CHLORIDE 0.9 % IV SOLN
Freq: Once | INTRAVENOUS | Status: AC
  Administered 2018-05-12: 09:00:00 via INTRAVENOUS

## 2018-05-12 MED ORDER — ALBUMIN HUMAN 25 % IV SOLN
INTRAVENOUS | Status: AC
  Filled 2018-05-12: qty 50

## 2018-05-12 MED ORDER — NOREPINEPHRINE 4 MG/250ML-% IV SOLN
0.0000 ug/min | INTRAVENOUS | Status: DC
  Administered 2018-05-12: 2 ug/min via INTRAVENOUS
  Administered 2018-05-13: 10 ug/min via INTRAVENOUS
  Administered 2018-05-13: 2 ug/min via INTRAVENOUS
  Administered 2018-05-14: 1 ug/min via INTRAVENOUS
  Filled 2018-05-12 (×4): qty 250

## 2018-05-12 MED ORDER — ACETAMINOPHEN 325 MG PO TABS: 650.0000 mg | ORAL_TABLET | Freq: Four times a day (QID) | ORAL | Status: DC | PRN

## 2018-05-12 MED ORDER — SODIUM CHLORIDE 0.9 % IV SOLN
80.0000 mg | Freq: Once | INTRAVENOUS | Status: DC
  Filled 2018-05-12: qty 80

## 2018-05-12 MED ORDER — SODIUM CHLORIDE 0.9 % IV SOLN
50.0000 ug/h | INTRAVENOUS | Status: DC
  Administered 2018-05-12 – 2018-05-16 (×5): 50 ug/h via INTRAVENOUS
  Filled 2018-05-12 (×9): qty 1

## 2018-05-12 MED ORDER — FENTANYL CITRATE (PF) 100 MCG/2ML IJ SOLN
50.0000 ug | Freq: Once | INTRAMUSCULAR | Status: AC
  Administered 2018-05-12: 50 ug via INTRAVENOUS
  Filled 2018-05-12: qty 2

## 2018-05-12 MED ORDER — SODIUM CHLORIDE 0.9 % IV SOLN
INTRAVENOUS | Status: AC
  Administered 2018-05-12: 11:00:00 via INTRAVENOUS
  Administered 2018-05-12: 16:00:00 1000 mL via INTRAVENOUS
  Administered 2018-05-13 (×2): via INTRAVENOUS

## 2018-05-12 MED ORDER — SODIUM CHLORIDE 0.9 % IV SOLN
80.0000 mg | Freq: Once | INTRAVENOUS | Status: AC
  Administered 2018-05-12: 80 mg via INTRAVENOUS
  Filled 2018-05-12: qty 80

## 2018-05-12 MED ORDER — TRAMADOL HCL 50 MG PO TABS
50.0000 mg | ORAL_TABLET | Freq: Four times a day (QID) | ORAL | Status: DC | PRN
  Administered 2018-05-14 – 2018-05-16 (×2): 100 mg via ORAL
  Filled 2018-05-12 (×2): qty 2

## 2018-05-12 MED ORDER — INSULIN ASPART 100 UNIT/ML ~~LOC~~ SOLN
0.0000 [IU] | Freq: Three times a day (TID) | SUBCUTANEOUS | Status: DC
  Administered 2018-05-12: 2 [IU] via SUBCUTANEOUS
  Administered 2018-05-12: 1 [IU] via SUBCUTANEOUS
  Administered 2018-05-12: 2 [IU] via SUBCUTANEOUS
  Administered 2018-05-13: 7 [IU] via SUBCUTANEOUS
  Administered 2018-05-13: 3 [IU] via SUBCUTANEOUS
  Administered 2018-05-13: 5 [IU] via SUBCUTANEOUS
  Administered 2018-05-14 (×2): 2 [IU] via SUBCUTANEOUS
  Administered 2018-05-14: 3 [IU] via SUBCUTANEOUS
  Administered 2018-05-15: 5 [IU] via SUBCUTANEOUS
  Administered 2018-05-15: 08:00:00 3 [IU] via SUBCUTANEOUS
  Administered 2018-05-15: 5 [IU] via SUBCUTANEOUS
  Administered 2018-05-16: 08:00:00 2 [IU] via SUBCUTANEOUS
  Administered 2018-05-16 (×2): 5 [IU] via SUBCUTANEOUS
  Administered 2018-05-17: 2 [IU] via SUBCUTANEOUS

## 2018-05-12 MED ORDER — CHLORHEXIDINE GLUCONATE CLOTH 2 % EX PADS
6.0000 | MEDICATED_PAD | Freq: Every day | CUTANEOUS | Status: DC
  Administered 2018-05-12 – 2018-05-16 (×5): 6 via TOPICAL

## 2018-05-12 MED ORDER — SODIUM CHLORIDE 0.9 % IV SOLN
8.0000 mg/h | INTRAVENOUS | Status: DC
  Administered 2018-05-12 – 2018-05-15 (×7): 8 mg/h via INTRAVENOUS
  Filled 2018-05-12 (×8): qty 80

## 2018-05-12 MED ORDER — PANTOPRAZOLE SODIUM 40 MG PO TBEC
40.0000 mg | DELAYED_RELEASE_TABLET | Freq: Two times a day (BID) | ORAL | Status: DC
  Administered 2018-05-12: 40 mg via ORAL
  Filled 2018-05-12 (×2): qty 1

## 2018-05-12 MED ORDER — ALBUMIN HUMAN 25 % IV SOLN
INTRAVENOUS | Status: AC
  Administered 2018-05-12: 21:00:00 12.5 g
  Filled 2018-05-12: qty 50

## 2018-05-12 MED ORDER — SODIUM CHLORIDE 0.9% FLUSH
10.0000 mL | INTRAVENOUS | Status: DC | PRN
  Administered 2018-05-17: 10 mL
  Filled 2018-05-12: qty 40

## 2018-05-12 MED ORDER — SODIUM CHLORIDE 0.9% FLUSH
3.0000 mL | Freq: Two times a day (BID) | INTRAVENOUS | Status: DC
  Administered 2018-05-13 – 2018-05-16 (×5): 3 mL via INTRAVENOUS

## 2018-05-12 MED ORDER — OCTREOTIDE LOAD VIA INFUSION
50.0000 ug | Freq: Once | INTRAVENOUS | Status: AC
  Administered 2018-05-12: 50 ug via INTRAVENOUS
  Filled 2018-05-12: qty 25

## 2018-05-12 NOTE — ED Notes (Signed)
Bed: SU86 Expected date:  Expected time:  Means of arrival:  Comments: EMS 62 yo male from home/syncope-hypotensive 87/50 after 1 liter of fluid at home

## 2018-05-12 NOTE — H&P (Signed)
History and Physical    Matthew Lewis EPP:[id number] DOB: [birth year] DOA: 05/12/2018  PCP: [name], MD   Patient coming from: Home   Chief Complaint: Syncope, lightheadedness, low BP   HPI: Matthew Lewis is a 63 y.o. male with medical history significant for metastatic cholangiocarcinoma, portal venous thrombosis not anticoagulated due to GI bleeding, biliary obstruction secondary to his malignancy with percutaneous drains, type 2 diabetes mellitus, liver cirrhosis, and history of substance abuse in remission, now presenting to emergency department with lightheadedness, low blood pressure, and syncope.  Patient reports feeling very weak in general, much worse when he tries to stand up, at which point he becomes very lightheaded.  He became acutely lightheaded and suffered a brief loss of consciousness at home, falling and hitting the back of his head on the ground.  EMS was called out, patient was found to have a systolic blood pressure in the 80s, and was given a liter of normal saline prior to arrival in the ED.  Patient has not noted any fevers, chest pain, or shortness of breath.  He reports a mild chronic nonproductive cough.  He does not know of any sick contacts or COVID-19 exposures.  Reports that he no longer takes propranolol due to recurrent hypotension, and no longer taking his aspirin due to problems with bleeding.  He denies any recent melena or hematochezia, reports having a normal bowel movement yesterday.   ED Course: Upon arrival to the ED, patient is found to be afebrile, saturating well on room air, tachycardic in 120s, and with blood pressure 82/61.  EKG features sinus tachycardia with rate 119 and right axis deviation.  Noncontrast head CT is negative for acute intracranial abnormality and cervical spine CT is negative for acute osseous abnormality.  Chest x-ray demonstrates the known left basilar metastases without acute cardiopulmonary disease.  Radiographs of  the lumbar spine are notable for degenerative changes without acute abnormality.  Chemistry panel features a hyponatremia and albumin of 2.3.  CBC is notable for leukocytosis to 46,500 and a macrocytic anemia with hemoglobin of 7.7, down from 10.1 a week earlier.  Lactic acid is elevated to 3.6.  Patient was given 1 additional liter of normal saline in the ED, blood cultures were collected, and he was treated with broad-spectrum empiric antibiotics.  Review of Systems:  All other systems reviewed and apart from HPI, are negative.  Past Medical History:  Diagnosis Date  . Abscess of anal and rectal regions    horse shoe abscess  . Alcoholic cirrhosis ([place])   . Arthritis    "everywhere; especially in my spine"  . Cirrhosis ([place])    STAGE 1 CIRRHOSIS PATIENT SEES DR NAMDIGAM FOR  . Colon cancer ([place]) 05/2005   TX SURGERY WITH LYMPH NODE REMOVAL  . Cough    LAST FEW WEEKS SAW DR RAMOS LUNGS MILKY SPUTUM OCC  . DM type 2 with diabetic peripheral neuropathy ([facility])    left foot  . Fatigue   . Fistula, anal    multiple  . History of substance abuse ([place])    last alcohol was 08/2011; last marijuania was 08/2011  . Hypertension   . PTSD (post-traumatic stress disorder)    "severe" HX OF  . Sleep deprivation     Past Surgical History:  Procedure Laterality Date  . ABSCESS DRAINAGE     "probably 15-20 so far at least; rectal"  . ANAL FISTULECTOMY  12/13/1998  . ANTERIOR FUSION CERVICAL SPINE  2010   "  triple"; "did 2 surgeries on the same day in 1610 due to complications"  . CORONARY ANGIOPLASTY  2001  . detached muscle  2010   right chest; "after cervical fusion complications"  . ELBOW SURGERY  2007   "cut out part of a muscle"; left  . ESOPHAGEAL BANDING  02/25/2018   Procedure: ESOPHAGEAL BANDING;  Surgeon: Ladene Artist, MD;  Location: [facility] ENDOSCOPY;  Service: Endoscopy;;  . ESOPHAGOGASTRODUODENOSCOPY (EGD) WITH PROPOFOL N/A 02/25/2018   Procedure: ESOPHAGOGASTRODUODENOSCOPY (EGD)  WITH PROPOFOL;  Surgeon: Ladene Artist, MD;  Location: [facility] ENDOSCOPY;  Service: Endoscopy;  Laterality: N/A;  . EXAMINATION UNDER ANESTHESIA  03/22/2005   fistula  . EXAMINATION UNDER ANESTHESIA  06/06/2011   Procedure: EXAM UNDER ANESTHESIA;  Surgeon: Stark Klein, MD;  Location: [place];  Service: General;  Laterality: N/A;  . HEMICOLECTOMY  06/06/2005   left  . INCISION AND DRAINAGE PERIRECTAL ABSCESS  03/30/2005  . INCISION AND DRAINAGE PERIRECTAL ABSCESS  12/13/2004  . INCISION AND DRAINAGE PERIRECTAL ABSCESS  07/14/2001  . INCISION AND DRAINAGE PERIRECTAL ABSCESS  08/12/2010   horseshoe abscess; Dr Redmond Pulling  . INCISION AND DRAINAGE PERIRECTAL ABSCESS  06/06/2011   Procedure: IRRIGATION AND DEBRIDEMENT PERIRECTAL ABSCESS;  Surgeon: Stark Klein, MD;  Location: [place];  Service: General;  Laterality: N/A;  . INCISION AND DRAINAGE PERIRECTAL ABSCESS N/A 02/24/2017   Procedure: IRRIGATION AND DEBRIDEMENT PERIRECTAL ABSCESS;  Surgeon: Ralene Ok, MD;  Location: [place];  Service: General;  Laterality: N/A;  . IR CATHETER TUBE CHANGE  02/19/2018  . IR CHOLANGIOGRAM EXISTING TUBE  12/23/2017  . IR CHOLANGIOGRAM EXISTING TUBE  01/02/2018  . IR CHOLANGIOGRAM EXISTING TUBE  05/04/2018  . IR EXCHANGE BILIARY DRAIN  11/07/2017  . IR EXCHANGE BILIARY DRAIN  12/09/2017  . IR EXCHANGE BILIARY DRAIN  02/03/2018  . IR EXCHANGE BILIARY DRAIN  02/03/2018  . IR EXCHANGE BILIARY DRAIN  04/03/2018  . IR EXCHANGE BILIARY DRAIN  04/03/2018  . IR EXCHANGE BILIARY DRAIN  04/23/2018  . IR EXCHANGE BILIARY DRAIN  04/28/2018  . IR EXCHANGE BILIARY DRAIN  04/28/2018  . IR FLUORO RM 30-60 MIN  04/15/2018  . IR IMAGING GUIDED PORT INSERTION  12/09/2017  . IR INT EXT BILIARY DRAIN WITH CHOLANGIOGRAM  10/26/2017  . IR INT EXT BILIARY DRAIN WITH CHOLANGIOGRAM  12/23/2017  . IR PARACENTESIS  02/25/2018  . IR PATIENT EVAL TECH 0-60 MINS  12/02/2017  . IR RADIOLOGIST EVAL & MGMT  11/06/2017  . IR RADIOLOGIST EVAL & MGMT  01/05/2018  .  [place]  . PERCUTANEOUS PINNING PHALANX FRACTURE OF HAND  ~ 2008   "plates in 2 places"; right  . PLACEMENT OF SETON N/A 09/05/2017   Procedure: PLACEMENT OF SETON;  Surgeon: Leighton Ruff, MD;  Location: [facility];  Service: General;  Laterality: N/A;  . RADIOLOGY WITH ANESTHESIA N/A 10/25/2017   Procedure: MRI WITH ANESTHESIA;  Surgeon: Radiologist, Medication, MD;  Location: [place];  Service: Radiology;  Laterality: N/A;  . THORACIC DISCECTOMY  1990's     reports that he quit smoking about 27 years ago. His smoking use included cigarettes. He has a 10.00 pack-year smoking history. He has never used smokeless tobacco. He reports previous alcohol use. He reports previous drug use. Drugs: Oxycodone and Marijuana.  Allergies  Allergen Reactions  . Morphine And Related Other (See Comments)    Patient is recovering from drug addiction and wants to avoid any narcotics  Family History  Problem Relation Age of Onset  . Heart disease Father   . Heart disease Mother   . Cancer Mother   . Cancer Sister   . Heart disease Brother      Prior to Admission medications   Medication Sig Start Date End Date Taking? Authorizing Provider  Ca Carbonate-Mag Hydroxide (ROLAIDS PO) Take 1-2 tablets by mouth 2 (two) times daily as needed (heartburn).    [provider]  fexofenadine (ALLEGRA) 180 MG tablet Take 180 mg by mouth at bedtime.    [provider]  gabapentin (NEURONTIN) 400 MG capsule Take 400-800 mg by mouth See admin instructions. Take 400 mg by mouth in the morning and 800 mg in the evening    [provider]  insulin detemir (LEVEMIR) 100 UNIT/ML injection Inject 10-62 Units into the skin See admin instructions. Take 62 in the am and 10 in the pm    [provider]  lactose free nutrition (BOOST PLUS) LIQD Take 237 mLs by mouth 3 (three) times daily with meals. Patient taking differently: Take 237 mLs by mouth 3 (three)  times daily with meals. Ensure chocolate and vanilla max protein 12/11/17   Regalado, Belkys A, MD  levocetirizine (XYZAL) 5 MG tablet Take 5 mg by mouth every evening.    [provider]  lidocaine-prilocaine (EMLA) cream Apply to port site 1 hour prior to access Patient taking differently: Apply 1 application topically once. Apply to port site 1 hour prior to access 01/20/18   [name], MD  Multiple Vitamin (MULTIVITAMIN) tablet Take 1 tablet by mouth daily.    [provider]  pantoprazole (PROTONIX) 40 MG tablet Take 1 tablet (40 mg total) by mouth 2 (two) times daily. 02/27/18   Thurnell Lose, MD  propranolol ER (INDERAL LA) 60 MG 24 hr capsule Take 1 capsule (60 mg total) by mouth daily for 30 days. 02/27/18 03/29/18  Thurnell Lose, MD  Sodium Chloride Flush (NORMAL SALINE FLUSH) 0.9 % SOLN Inject 10 mLs into the vein daily. 04/23/18   [name], MD  Sodium Chloride Flush (NORMAL SALINE FLUSH) 0.9 % SOLN Inject 10 mLs into the vein daily. 04/23/18   [name], MD  tamsulosin (FLOMAX) 0.4 MG CAPS capsule TAKE 1 CAPSULE(0.4 MG) BY MOUTH DAILY AFTER SUPPER 03/12/18   [name], MD    Physical Exam: Vitals:   05/12/18 0202 05/12/18 0204 05/12/18 0255 05/12/18 0428  BP:  (!) 82/61 94/66 98/67   Pulse:  (!) 120 (!) 124 (!) 129  Resp:  17 15 16   Temp:  98.8 F (37.1 C)    TempSrc:  Rectal    SpO2: 99% 100% 100% 98%      Constitutional: NAD, calm, listless Eyes: PERTLA, lids and conjunctivae normal ENMT: Mucous membranes are moist. Posterior pharynx clear of any exudate or lesions.   Neck: normal, supple, no masses, no thyromegaly Respiratory: no wheezing, no crackles. No accessory muscle use.  Cardiovascular: Rate ~120 and regular. No extremity edema.   Abdomen: No distension, soft, percutaneous drains with scant bilious drainage. Bowel sounds active.  Musculoskeletal: no clubbing / cyanosis. No joint deformity upper and lower extremities.  .  Skin: no significant  rashes, lesions, ulcers. Poor turgor. Neurologic: No gross facial asymmetry. Sensation intact. Moving all extremities.  Psychiatric: Alert and oriented x 3. Very pleasant and cooperative.    Labs on Admission: I have personally reviewed following labs and imaging studies  CBC: Recent Labs  Lab  05/07/18 0840 05/12/18 0201  WBC 11.2* 46.5*  NEUTROABS 8.8* 40.5*  HGB 10.1* 7.7*  HCT 30.7* 24.2*  MCV 96.8 104.3*  PLT 150 500   Basic Metabolic Panel: Recent Labs  Lab 05/07/18 0840 05/12/18 0201  NA 133* 132*  K 3.4* 4.5  CL 100 103  CO2 24 20*  GLUCOSE 244* 172*  BUN 13 21  CREATININE 0.77 0.70  CALCIUM 8.8* 7.7*   GFR: Estimated Creatinine Clearance: 99.4 mL/min (by C-G formula based on SCr of 0.7 mg/dL). Liver Function Tests: Recent Labs  Lab 05/07/18 0840 05/12/18 0201  AST 40 44*  ALT 34 35  ALKPHOS 263* 206*  BILITOT 1.0 1.1  PROT 7.8 5.9*  ALBUMIN 2.8* 2.3*   No results for input(s): LIPASE, AMYLASE in the last 168 hours. No results for input(s): AMMONIA in the last 168 hours. Coagulation Profile: Recent Labs  Lab 05/12/18 0201  INR 1.3*   Cardiac Enzymes: Recent Labs  Lab 05/12/18 0201  TROPONINI <0.03   BNP (last 3 results) No results for input(s): PROBNP in the last 8760 hours. HbA1C: No results for input(s): HGBA1C in the last 72 hours. CBG: No results for input(s): GLUCAP in the last 168 hours. Lipid Profile: No results for input(s): CHOL, HDL, LDLCALC, TRIG, CHOLHDL, LDLDIRECT in the last 72 hours. Thyroid Function Tests: No results for input(s): TSH, T4TOTAL, FREET4, T3FREE, THYROIDAB in the last 72 hours. Anemia Panel: No results for input(s): VITAMINB12, FOLATE, FERRITIN, TIBC, IRON, RETICCTPCT in the last 72 hours. Urine analysis:    Component Value Date/Time   COLORURINE YELLOW 02/24/2018 0536   APPEARANCEUR CLEAR 02/24/2018 0536   LABSPEC 1.012 02/24/2018 0536   PHURINE 5.0 02/24/2018 0536   GLUCOSEU NEGATIVE 02/24/2018 0536    HGBUR NEGATIVE 02/24/2018 0536   BILIRUBINUR NEGATIVE 02/24/2018 0536   KETONESUR 5 (A) 02/24/2018 0536   PROTEINUR NEGATIVE 02/24/2018 0536   UROBILINOGEN 0.2 08/15/2011 1816   NITRITE NEGATIVE 02/24/2018 0536   LEUKOCYTESUR NEGATIVE 02/24/2018 0536   Sepsis Labs: @LABRCNTIP (procalcitonin:4,lacticidven:4) ) Recent Results (from the past 240 hour(s))  SARS Coronavirus 2 (CEPHEID - Performed in [place] hospital lab), Hosp Order     Status: None   Collection Time: 05/12/18  2:06 AM  Result Value Ref Range Status   SARS Coronavirus 2 NEGATIVE NEGATIVE Final    Comment: (NOTE) If result is NEGATIVE SARS-CoV-2 target nucleic acids are NOT DETECTED. The SARS-CoV-2 RNA is generally detectable in upper and lower  respiratory specimens during the acute phase of infection. The lowest  concentration of SARS-CoV-2 viral copies this assay can detect is 250  copies / mL. A negative result does not preclude SARS-CoV-2 infection  and should not be used as the sole basis for treatment or other  patient management decisions.  A negative result may occur with  improper specimen collection / handling, submission of specimen other  than nasopharyngeal swab, presence of viral mutation(s) within the  areas targeted by this assay, and inadequate number of viral copies  (<250 copies / mL). A negative result must be combined with clinical  observations, patient history, and epidemiological information. If result is POSITIVE SARS-CoV-2 target nucleic acids are DETECTED. The SARS-CoV-2 RNA is generally detectable in upper and lower  respiratory specimens dur ing the acute phase of infection.  Positive  results are indicative of active infection with SARS-CoV-2.  Clinical  correlation with patient history and other diagnostic information is  necessary to determine patient infection status.  Positive results do  not rule out bacterial infection or co-infection with other viruses. If result is  PRESUMPTIVE POSTIVE SARS-CoV-2 nucleic acids MAY BE PRESENT.   A presumptive positive result was obtained on the submitted specimen  and confirmed on repeat testing.  While 2019 novel coronavirus  (SARS-CoV-2) nucleic acids may be present in the submitted sample  additional confirmatory testing may be necessary for epidemiological  and / or clinical management purposes  to differentiate between  SARS-CoV-2 and other Sarbecovirus currently known to infect humans.  If clinically indicated additional testing with an alternate test  methodology [phone]) is advised. The SARS-CoV-2 RNA is generally  detectable in upper and lower respiratory sp ecimens during the acute  phase of infection. The expected result is Negative. Fact Sheet for Patients:  StrictlyIdeas.no Fact Sheet for Healthcare Providers: BankingDealers.co.za This test is not yet approved or cleared by the [place] FDA and has been authorized for detection and/or diagnosis of SARS-CoV-2 by FDA under an Emergency Use Authorization (EUA).  This EUA will remain in effect (meaning this test can be used) for the duration of the COVID-19 declaration under Section 564(b)(1) of the Act, 21 U.S.C. section 360bbb-3(b)(1), unless the authorization is terminated or revoked sooner. Performed at [facility], [place] [address], [place], [place] [address]      Radiological Exams on Admission: Dg Chest 1 View  Result Date: 05/12/2018 CLINICAL DATA:  Fall, syncope, hypotension EXAM: CHEST  1 VIEW COMPARISON:  02/24/2018.  Chest CT 02/19/2018 FINDINGS: Right Port-A-Cath in place with the tip in the right atrium. Low lung volumes. Heart is normal size. Previously seen left lower lobe/basilar lung metastasis not as well visualized as on prior CT. No acute airspace consolidation or effusion. No acute bony abnormality. IMPRESSION: Previously seen left basilar metastasis better seen on  prior CT. No new acute cardiopulmonary process. Electronically Signed   By: Rolm Baptise M.D.   On: 05/12/2018 02:43   Dg Lumbar Spine Complete  Result Date: 05/12/2018 CLINICAL DATA:  Hypotension.  Syncope, fall EXAM: LUMBAR SPINE - COMPLETE 4+ VIEW COMPARISON:  None. FINDINGS: Lumbar straightening. Diffuse advanced degenerative disc and facet disease. 7 mm of retrolisthesis of L3 on L4. 11 mm retrolisthesis of L4 on L5. 6 mm anterolisthesis of L5 on S1. No fracture. SI joints symmetric and unremarkable. IMPRESSION: Degenerative disc and facet disease as above with associated multilevel listhesis. No acute bony abnormality. Electronically Signed   By: Rolm Baptise M.D.   On: 05/12/2018 02:44   Ct Head Wo Contrast  Result Date: 05/12/2018 CLINICAL DATA:  Headache, hypotension, fall, syncope EXAM: CT HEAD WITHOUT CONTRAST CT CERVICAL SPINE WITHOUT CONTRAST TECHNIQUE: Multidetector CT imaging of the head and cervical spine was performed following the standard protocol without intravenous contrast. Multiplanar CT image reconstructions of the cervical spine were also generated. COMPARISON:  None. FINDINGS: CT HEAD FINDINGS Brain: No acute intracranial abnormality. Specifically, no hemorrhage, hydrocephalus, mass lesion, acute infarction, or significant intracranial injury. Vascular: No hyperdense vessel or unexpected calcification. Skull: No acute calvarial abnormality. Sinuses/Orbits: Visualized paranasal sinuses and mastoids clear. Orbital soft tissues unremarkable. Other: None CT CERVICAL SPINE FINDINGS Alignment: No subluxation Skull base and vertebrae: No acute fracture. No primary bone lesion or focal pathologic process. Soft tissues and spinal canal: No prevertebral fluid or swelling. No visible canal hematoma. Disc levels: Prior anterior fusion from C5-T1. Degenerative disc disease at C4-5 with anterior spurring. Mild diffuse degenerative facet disease. Upper chest: No acute findings Other: Carotid artery  calcifications. IMPRESSION:  No acute intracranial abnormality. Postoperative changes and degenerative changes in the cervical spine. No acute bony abnormality. Electronically Signed   By: Rolm Baptise M.D.   On: 05/12/2018 03:01   Ct Cervical Spine Wo Contrast  Result Date: 05/12/2018 CLINICAL DATA:  Headache, hypotension, fall, syncope EXAM: CT HEAD WITHOUT CONTRAST CT CERVICAL SPINE WITHOUT CONTRAST TECHNIQUE: Multidetector CT imaging of the head and cervical spine was performed following the standard protocol without intravenous contrast. Multiplanar CT image reconstructions of the cervical spine were also generated. COMPARISON:  None. FINDINGS: CT HEAD FINDINGS Brain: No acute intracranial abnormality. Specifically, no hemorrhage, hydrocephalus, mass lesion, acute infarction, or significant intracranial injury. Vascular: No hyperdense vessel or unexpected calcification. Skull: No acute calvarial abnormality. Sinuses/Orbits: Visualized paranasal sinuses and mastoids clear. Orbital soft tissues unremarkable. Other: None CT CERVICAL SPINE FINDINGS Alignment: No subluxation Skull base and vertebrae: No acute fracture. No primary bone lesion or focal pathologic process. Soft tissues and spinal canal: No prevertebral fluid or swelling. No visible canal hematoma. Disc levels: Prior anterior fusion from C5-T1. Degenerative disc disease at C4-5 with anterior spurring. Mild diffuse degenerative facet disease. Upper chest: No acute findings Other: Carotid artery calcifications. IMPRESSION: No acute intracranial abnormality. Postoperative changes and degenerative changes in the cervical spine. No acute bony abnormality. Electronically Signed   By: Rolm Baptise M.D.   On: 05/12/2018 03:01    EKG: Independently reviewed. Sinus tachycardia (rate 119), RAD.   Assessment/Plan   1. Hypotension with syncope  - Presents with lightheadedness and syncope on standing, found to be hypotensive with SBP in 80's  - There is no  fever and no source of infection has been identified; he is in sinus rhythm without chest pain; no rash or evidence for anaphylaxis; no obvious bleeding   - He has had recurrent episodes of hypotension and no longer takes propranolol  - SBP improved to upper 90's-low 100's with IVF in ED  - Continue IVF, continue empiric antibiotics for now    2. SIRS  - Presents with syncope upon standing, found to have hypotension with tachycardia, elevated lactate, and marked leukocytosis  - Urinalysis still pending but he denies any urinary sxs  - No respiratory complaints and CXR is clear, COVID-19 negative and no known exposures, there is no meningismus, no appreciable cellulitis, and abdominal exam is benign  - Blood cultures were collected in ED, he received IVF with EMS and completed 30 cc/kg NS bolus in ED, and was started on broad-spectrum empiric antibiotics  - Continue empiric antibiotics for now, trend lactate, follow cultures and clinical course    3. Metastatic cholangiocarcinoma  - Follows with oncology, had cycle 5 of FOLFOX on 4/30, received pegfilgrastim on 05/09/18, and is planned for upcoming CT scan    4. Macrocytic anemia  - Hgb is 7.7 on admission, down from 10.1 a week earlier  - He denies any melena or hematochezia; no blood noted in biliary drains  - Check anemia panel and FOBT, type and screen, and repeat CBC in am    5. Hyponatremia  - Serum sodium is 132 on admission in setting of hypovolemia  - Continue fluid-resuscitation, repeat chem panel in am    6. Type II DM  - A1c was only 4.9% in November  - Check CBG's and use a low-intensity SSI with Novolog as needed     PPE: Mask, face shield  DVT prophylaxis: SCD's   Code Status: DNR  Family Communication: Discussed with patient  Consults called:  None Admission status: Observation     Vianne Bulls, MD [facility] Pager [phone]  If 7PM-7AM, please contact night-coverage [email or web address] Password [email or web address]   05/12/2018, 4:51 AM

## 2018-05-12 NOTE — Consult Note (Signed)
Consultation  Referring Provider:      Primary Care Physician:  [name], MD Primary Gastroenterologist:         Reason for Consultation:          Impression / Plan:   Hematemesis - acute severe GI bleed Shock from blood/volume loss Esophageal varices in cirrhosis and metastatic cholangiocarcinoma - on FOLFX chemotherapy Portal vein thrombosis DNR  Emergent EGD He is critically ill and on third unit RBC Often would intubate patient to protect airway but he is DNR and does not want to be intubated Given the nature of the situation have decided - in consultation with the patient to proceed to EGD with plans to treat bleeding which is most likely variceal in origin. He understands risk of aspiration and problems of endoscopy and wants to proceed - also understanding limited or no sedation likely           HPI:   Matthew Lewis is a 63 y.o. male with cirrhosis, PVT , metastatic cholangiocarcinoma on Tx and known esophageal varices and duodenal ulcer admitted about 2 days agow with weakness and suspected sepsis - he started vomiting copious amounts > 1 L total of blood tonight, is hypotensive, has poor venous access and is on second unit of blood after Hgb 5. CBC Latest Ref Rng & Units 05/12/2018 05/12/2018 05/07/2018  WBC 4.0 - 10.5 K/uL - 46.5(H) 11.2(H)  Hemoglobin 13.0 - 17.0 g/dL 5.0(LL) 7.7(L) 10.1(L)  Hematocrit 39.0 - 52.0 % 15.9(L) 24.2(L) 30.7(L)  Platelets 150 - 400 K/uL - 181 150   He denies abdominal pain. EGD 2/20202 duodenal ulcer and gr 2 varices w/o signs bleeding  Past Medical History:  Diagnosis Date   Abscess of anal and rectal regions    horse shoe abscess   Alcoholic cirrhosis ([place])    Arthritis    "everywhere; especially in my spine"   Cirrhosis ([place])    STAGE 1 CIRRHOSIS PATIENT SEES DR NAMDIGAM FOR   Colon cancer ([place]) 05/2005   TX SURGERY WITH LYMPH NODE REMOVAL   Cough    LAST FEW WEEKS SAW DR RAMOS LUNGS MILKY SPUTUM [place]    DM type 2 with diabetic peripheral neuropathy ([place])    left foot   Fatigue    Fistula, anal    multiple   History of substance abuse ([place])    last alcohol was 08/2011; last marijuania was 08/2011   Hypertension    PTSD (post-traumatic stress disorder)    "severe" HX OF   Sleep deprivation     Past Surgical History:  Procedure Laterality Date   ABSCESS DRAINAGE     "probably 15-20 so far at least; rectal"   ANAL FISTULECTOMY  12/13/1998   ANTERIOR FUSION CERVICAL SPINE  2010   "triple"; "did 2 surgeries on the same day in [id number] due to complications"   CORONARY ANGIOPLASTY  2001   detached muscle  2010   right chest; "after cervical fusion complications"   ELBOW SURGERY  2007   "cut out part of a muscle"; left   ESOPHAGEAL BANDING  02/25/2018   Procedure: ESOPHAGEAL BANDING;  Surgeon: Ladene Artist, MD;  Location: [place];  Service: Endoscopy;;   ESOPHAGOGASTRODUODENOSCOPY (EGD) WITH PROPOFOL N/A 02/25/2018   Procedure: ESOPHAGOGASTRODUODENOSCOPY (EGD) WITH PROPOFOL;  Surgeon: Ladene Artist, MD;  Location: [place];  Service: Endoscopy;  Laterality: N/A;   EXAMINATION UNDER ANESTHESIA  03/22/2005   fistula   EXAMINATION UNDER ANESTHESIA  06/06/2011  Procedure: EXAM UNDER ANESTHESIA;  Surgeon: Stark Klein, MD;  Location: [facility];  Service: General;  Laterality: N/A;   HEMICOLECTOMY  06/06/2005   left   INCISION AND DRAINAGE PERIRECTAL ABSCESS  03/30/2005   INCISION AND DRAINAGE PERIRECTAL ABSCESS  12/13/2004   INCISION AND DRAINAGE PERIRECTAL ABSCESS  07/14/2001   INCISION AND DRAINAGE PERIRECTAL ABSCESS  08/12/2010   horseshoe abscess; Dr Redmond Pulling   INCISION AND DRAINAGE PERIRECTAL ABSCESS  06/06/2011   Procedure: IRRIGATION AND DEBRIDEMENT PERIRECTAL ABSCESS;  Surgeon: Stark Klein, MD;  Location: [facility];  Service: General;  Laterality: N/A;   INCISION AND DRAINAGE PERIRECTAL ABSCESS N/A 02/24/2017   Procedure: IRRIGATION AND DEBRIDEMENT PERIRECTAL  ABSCESS;  Surgeon: Ralene Ok, MD;  Location: [facility];  Service: General;  Laterality: N/A;   IR CATHETER TUBE CHANGE  02/19/2018   IR CHOLANGIOGRAM EXISTING TUBE  12/23/2017   IR CHOLANGIOGRAM EXISTING TUBE  01/02/2018   IR CHOLANGIOGRAM EXISTING TUBE  05/04/2018   IR EXCHANGE BILIARY DRAIN  11/07/2017   IR EXCHANGE BILIARY DRAIN  12/09/2017   IR EXCHANGE BILIARY DRAIN  02/03/2018   IR EXCHANGE BILIARY DRAIN  02/03/2018   IR EXCHANGE BILIARY DRAIN  04/03/2018   IR EXCHANGE BILIARY DRAIN  04/03/2018   IR EXCHANGE BILIARY DRAIN  04/23/2018   IR EXCHANGE BILIARY DRAIN  04/28/2018   IR EXCHANGE BILIARY DRAIN  04/28/2018   IR FLUORO RM 30-60 MIN  04/15/2018   IR IMAGING GUIDED PORT INSERTION  12/09/2017   IR INT EXT BILIARY DRAIN WITH CHOLANGIOGRAM  10/26/2017   IR INT EXT BILIARY DRAIN WITH CHOLANGIOGRAM  12/23/2017   IR PARACENTESIS  02/25/2018   IR PATIENT EVAL TECH 0-60 MINS  12/02/2017   IR RADIOLOGIST EVAL & MGMT  11/06/2017   IR RADIOLOGIST EVAL & MGMT  01/05/2018   LUMBAR DISC SURGERY  1987   PERCUTANEOUS PINNING PHALANX FRACTURE OF HAND  ~ 2008   "plates in 2 places"; right   PLACEMENT OF SETON N/A 09/05/2017   Procedure: PLACEMENT OF SETON;  Surgeon: Leighton Ruff, MD;  Location: [facility];  Service: General;  Laterality: N/A;   RADIOLOGY WITH ANESTHESIA N/A 10/25/2017   Procedure: MRI WITH ANESTHESIA;  Surgeon: Radiologist, Medication, MD;  Location: [place];  Service: Radiology;  Laterality: N/A;   THORACIC DISCECTOMY  36's    Family History  Problem Relation Age of Onset   Heart disease Father    Heart disease Mother    Cancer Mother    Cancer Sister    Heart disease Brother     Social History   Tobacco Use   Smoking status: Former Smoker    Packs/day: 1.00    Years: 10.00    Pack years: 10.00    Types: Cigarettes    Last attempt to quit: 01/08/1991    Years since quitting: 27.3   Smokeless tobacco: Never Used    Substance Use Topics   Alcohol use: Not Currently    Comment: 06/05/11 "drank enough when I was younger to last me my whole life; don't remember when I had my last drink, maybe 2012"   Drug use: Not Currently    Types: Oxycodone, Marijuana    Comment: 06/05/11 "pot head in my younger days; 2009 dr had me on oxycodone and valium for almost 105yr    Prior to Admission medications   Medication Sig Start Date End Date Taking? Authorizing Provider  Ca Carbonate-Mag Hydroxide (ROLAIDS PO) Take 1-2 tablets by mouth 2 (  two) times daily as needed (heartburn).   Yes [provider]  cetirizine (ZYRTEC) 10 MG tablet Take 10 mg by mouth at bedtime.   Yes [provider]  DULoxetine (CYMBALTA) 30 MG capsule Take 30 mg by mouth 2 (two) times daily.   Yes [provider]  fexofenadine (ALLEGRA) 180 MG tablet Take 180 mg by mouth daily after breakfast.    Yes [provider]  gabapentin (NEURONTIN) 400 MG capsule Take 400-800 mg by mouth See admin instructions. Take 400 mg by mouth in the morning and 800 mg in the evening   Yes [provider]  insulin detemir (LEVEMIR) 100 UNIT/ML injection Inject 10-62 Units into the skin See admin instructions. Take 62 in the am and 10 in the pm   Yes [provider]  lidocaine-prilocaine (EMLA) cream Apply to port site 1 hour prior to access Patient taking differently: Apply 1 application topically once. Apply to port site 1 hour prior to access 01/20/18  Yes [name], MD  Multiple Vitamin (MULTIVITAMIN) tablet Take 1 tablet by mouth daily.   Yes [provider]  Sodium Chloride Flush (NORMAL SALINE FLUSH) 0.9 % SOLN Inject 10 mLs into the vein daily. 04/23/18  Yes [name], MD  tamsulosin (FLOMAX) 0.4 MG CAPS capsule TAKE 1 CAPSULE(0.4 MG) BY MOUTH DAILY AFTER SUPPER Patient taking differently: Take 0.4 mg by mouth daily after supper.  03/12/18  Yes [name], MD  lactose free nutrition (BOOST PLUS) LIQD Take  237 mLs by mouth 3 (three) times daily with meals. Patient not taking: Reported on 05/12/2018 12/11/17   Regalado, Jerald Kief A, MD  pantoprazole (PROTONIX) 40 MG tablet Take 1 tablet (40 mg total) by mouth 2 (two) times daily. Patient not taking: Reported on 05/12/2018 02/27/18   Thurnell Lose, MD    Current Facility-Administered Medications  Medication Dose Route Frequency Provider Last Rate Last Dose   0.9 %  sodium chloride infusion (Manually program via Guardrails IV Fluids)   Intravenous Once Ogan, Kerry Kass, MD       0.9 %  sodium chloride infusion (Manually program via Guardrails IV Fluids)   Intravenous Once Ogan, Okoronkwo U, MD       0.9 %  sodium chloride infusion  250 mL Intravenous PRN Opyd, Ilene Qua, MD 10 mL/hr at 05/12/18 1437 250 mL at 05/12/18 1437   0.9 %  sodium chloride infusion   Intravenous Continuous Eugenie Filler, MD 125 mL/hr at 05/12/18 1530 1,000 mL at 05/12/18 1530   acetaminophen (TYLENOL) tablet 650 mg  650 mg Oral Q6H PRN Opyd, Ilene Qua, MD       Or   acetaminophen (TYLENOL) suppository 650 mg  650 mg Rectal Q6H PRN Opyd, Ilene Qua, MD       Chlorhexidine Gluconate Cloth 2 % PADS 6 each  6 each Topical Daily Eugenie Filler, MD   6 each at 05/12/18 1449   Chlorhexidine Gluconate Cloth 2 % PADS 6 each  6 each Topical Daily Eugenie Filler, MD       insulin aspart (novoLOG) injection 0-5 Units  0-5 Units Subcutaneous QHS Opyd, Ilene Qua, MD       insulin aspart (novoLOG) injection 0-9 Units  0-9 Units Subcutaneous TID WC Opyd, Ilene Qua, MD   1 Units at 05/12/18 1747   ketorolac (TORADOL) 30 MG/ML injection 30 mg  30 mg Intravenous Q6H PRN Eugenie Filler, MD   30 mg at 05/12/18 1747  lidocaine (LIDODERM) 5 % 1 patch  1 patch Transdermal Q24H Opyd, Ilene Qua, MD   1 patch at 05/12/18 0401   meropenem (MERREM) 1 g in sodium chloride 0.9 % 100 mL IVPB  1 g Intravenous Q8H Opyd, Ilene Qua, MD   Stopped at 05/12/18 1514   metroNIDAZOLE  (FLAGYL) IVPB 500 mg  500 mg Intravenous Q8H Opyd, Ilene Qua, MD   Stopped at 05/12/18 1627   octreotide (SANDOSTATIN) 2 mcg/mL load via infusion 50 mcg  50 mcg Intravenous Once Gatha Mayer, MD       And   octreotide (SANDOSTATIN) 500 mcg in sodium chloride 0.9 % 250 mL (2 mcg/mL) infusion  50 mcg/hr Intravenous Continuous Gatha Mayer, MD       ondansetron [facility]) tablet 4 mg  4 mg Oral Q6H PRN Opyd, Ilene Qua, MD       Or   ondansetron (ZOFRAN) injection 4 mg  4 mg Intravenous Q6H PRN Opyd, Ilene Qua, MD   4 mg at 05/12/18 2106   pantoprazole (PROTONIX) 80 mg in sodium chloride 0.9 % 250 mL (0.32 mg/mL) infusion  8 mg/hr Intravenous Continuous Ogan, Okoronkwo U, MD       polyethylene glycol (MIRALAX / GLYCOLAX) packet 17 g  17 g Oral Daily PRN Opyd, Ilene Qua, MD       sodium chloride flush (NS) 0.9 % injection 10-40 mL  10-40 mL Intracatheter Q12H Eugenie Filler, MD       sodium chloride flush (NS) 0.9 % injection 10-40 mL  10-40 mL Intracatheter PRN Eugenie Filler, MD       sodium chloride flush (NS) 0.9 % injection 10-40 mL  10-40 mL Intracatheter Q12H Eugenie Filler, MD       sodium chloride flush (NS) 0.9 % injection 10-40 mL  10-40 mL Intracatheter PRN Eugenie Filler, MD       sodium chloride flush (NS) 0.9 % injection 3 mL  3 mL Intravenous Q12H Opyd, Ilene Qua, MD       sodium chloride flush (NS) 0.9 % injection 3 mL  3 mL Intravenous Q12H Opyd, Ilene Qua, MD       sodium chloride flush (NS) 0.9 % injection 3 mL  3 mL Intravenous PRN Opyd, Ilene Qua, MD       traMADol Veatrice Bourbon) tablet 50-100 mg  50-100 mg Oral Q6H PRN Eugenie Filler, MD        Allergies as of 05/12/2018 - Review Complete 05/12/2018  Allergen Reaction Noted   Morphine and related Other (See Comments) 10/24/2017     Review of Systems:    This is positive for those things mentioned in the HPI, also positive for undergoing chemotherapy, weak, light headed, poor appetite All  other review of systems are negative.       Physical Exam:  Vital signs in last 24 hours: Temp:  [97.4 F (36.3 C)-98.8 F (37.1 C)] 97.4 F (36.3 C) (05/05 2205) Pulse Rate:  [117-132] 129 (05/05 2205) Resp:  [14-23] 22 (05/05 2205) BP: (78-110)/(37-67) 78/37 (05/05 2205) SpO2:  [94 %-100 %] 100 % (05/05 2205)    General:  Acutely and chronically ill, vomiting blood Eyes:  anicteric. Lungs: Clear to auscultation bilaterally ant Heart:  S1S2, no rubs, murmurs, gallops. Abdomen:  soft, non-tender, biliary drain in LUQ Extremities:   no edema Skin   no rash. Neuro:  A&O x 3.  Psych:  appropriate mood and  Affect in setting of  critical illness   Data Reviewed:   LAB RESULTS: Recent Labs    05/12/18 0201 05/12/18 2113  WBC 46.5*  --   HGB 7.7* 5.0*  HCT 24.2* 15.9*  PLT 181  --    BMET Recent Labs    05/12/18 0201  NA 132*  K 4.5  CL 103  CO2 20*  GLUCOSE 172*  BUN 21  CREATININE 0.70  CALCIUM 7.7*   LFT Recent Labs    05/12/18 0201  PROT 5.9*  ALBUMIN 2.3*  AST 44*  ALT 35  ALKPHOS 206*  BILITOT 1.1   PT/INR Recent Labs    05/12/18 0201 05/12/18 2113  LABPROT 16.3* 19.7*  INR 1.3* 1.7*    STUDIES: Dg Chest 1 View  Result Date: 05/12/2018 CLINICAL DATA:  Fall, syncope, hypotension EXAM: CHEST  1 VIEW COMPARISON:  02/24/2018.  Chest CT 02/19/2018 FINDINGS: Right Port-A-Cath in place with the tip in the right atrium. Low lung volumes. Heart is normal size. Previously seen left lower lobe/basilar lung metastasis not as well visualized as on prior CT. No acute airspace consolidation or effusion. No acute bony abnormality. IMPRESSION: Previously seen left basilar metastasis better seen on prior CT. No new acute cardiopulmonary process. Electronically Signed   By: Rolm Baptise M.D.   On: 05/12/2018 02:43   Dg Lumbar Spine Complete  Result Date: 05/12/2018 CLINICAL DATA:  Hypotension.  Syncope, fall EXAM: LUMBAR SPINE - COMPLETE 4+ VIEW COMPARISON:   None. FINDINGS: Lumbar straightening. Diffuse advanced degenerative disc and facet disease. 7 mm of retrolisthesis of L3 on L4. 11 mm retrolisthesis of L4 on L5. 6 mm anterolisthesis of L5 on S1. No fracture. SI joints symmetric and unremarkable. IMPRESSION: Degenerative disc and facet disease as above with associated multilevel listhesis. No acute bony abnormality. Electronically Signed   By: Rolm Baptise M.D.   On: 05/12/2018 02:44   Ct Head Wo Contrast  Result Date: 05/12/2018 CLINICAL DATA:  Headache, hypotension, fall, syncope EXAM: CT HEAD WITHOUT CONTRAST CT CERVICAL SPINE WITHOUT CONTRAST TECHNIQUE: Multidetector CT imaging of the head and cervical spine was performed following the standard protocol without intravenous contrast. Multiplanar CT image reconstructions of the cervical spine were also generated. COMPARISON:  None. FINDINGS: CT HEAD FINDINGS Brain: No acute intracranial abnormality. Specifically, no hemorrhage, hydrocephalus, mass lesion, acute infarction, or significant intracranial injury. Vascular: No hyperdense vessel or unexpected calcification. Skull: No acute calvarial abnormality. Sinuses/Orbits: Visualized paranasal sinuses and mastoids clear. Orbital soft tissues unremarkable. Other: None CT CERVICAL SPINE FINDINGS Alignment: No subluxation Skull base and vertebrae: No acute fracture. No primary bone lesion or focal pathologic process. Soft tissues and spinal canal: No prevertebral fluid or swelling. No visible canal hematoma. Disc levels: Prior anterior fusion from C5-T1. Degenerative disc disease at C4-5 with anterior spurring. Mild diffuse degenerative facet disease. Upper chest: No acute findings Other: Carotid artery calcifications. IMPRESSION: No acute intracranial abnormality. Postoperative changes and degenerative changes in the cervical spine. No acute bony abnormality. Electronically Signed   By: Rolm Baptise M.D.   On: 05/12/2018 03:01   Ct Cervical Spine Wo  Contrast  Result Date: 05/12/2018 CLINICAL DATA:  Headache, hypotension, fall, syncope EXAM: CT HEAD WITHOUT CONTRAST CT CERVICAL SPINE WITHOUT CONTRAST TECHNIQUE: Multidetector CT imaging of the head and cervical spine was performed following the standard protocol without intravenous contrast. Multiplanar CT image reconstructions of the cervical spine were also generated. COMPARISON:  None. FINDINGS: CT HEAD FINDINGS Brain: No acute intracranial abnormality. Specifically, no hemorrhage, hydrocephalus,  mass lesion, acute infarction, or significant intracranial injury. Vascular: No hyperdense vessel or unexpected calcification. Skull: No acute calvarial abnormality. Sinuses/Orbits: Visualized paranasal sinuses and mastoids clear. Orbital soft tissues unremarkable. Other: None CT CERVICAL SPINE FINDINGS Alignment: No subluxation Skull base and vertebrae: No acute fracture. No primary bone lesion or focal pathologic process. Soft tissues and spinal canal: No prevertebral fluid or swelling. No visible canal hematoma. Disc levels: Prior anterior fusion from C5-T1. Degenerative disc disease at C4-5 with anterior spurring. Mild diffuse degenerative facet disease. Upper chest: No acute findings Other: Carotid artery calcifications. IMPRESSION: No acute intracranial abnormality. Postoperative changes and degenerative changes in the cervical spine. No acute bony abnormality. Electronically Signed   By: Rolm Baptise M.D.   On: 05/12/2018 03:01    Thanks   LOS: 0 days   @Zadrian Mccauley  Simonne Maffucci, MD, [facility] @  05/12/2018, 10:44 PM

## 2018-05-12 NOTE — Progress Notes (Signed)
Pt signed consent for arterial line/catheter insertion.  After 2 attempts by this RT and 2 attempts by Ned Grace, RRT, RCP the Pt refused anyone from any other service attempting any further placement.  RN and MD from gastroenterology in room at time of refusal.

## 2018-05-12 NOTE — Plan of Care (Signed)
  Problem: Education: Goal: Knowledge of General Education information will improve Description Including pain rating scale, medication(s)/side effects and non-pharmacologic comfort measures Outcome: Progressing   Problem: Health Behavior/Discharge Planning: Goal: Ability to manage health-related needs will improve Outcome: Progressing   Problem: Clinical Measurements: Goal: Ability to maintain clinical measurements within normal limits will improve Outcome: Progressing Goal: Will remain free from infection Outcome: Progressing Goal: Diagnostic test results will improve Outcome: Progressing Goal: Respiratory complications will improve Outcome: Progressing Goal: Cardiovascular complication will be avoided Outcome: Progressing   Problem: Nutrition: Goal: Adequate nutrition will be maintained Outcome: Progressing   Problem: Pain Managment: Goal: General experience of comfort will improve Outcome: Progressing

## 2018-05-12 NOTE — ED Notes (Addendum)
Pt transported to xray 

## 2018-05-12 NOTE — Progress Notes (Signed)
I have seen and assessed patient and I agree with Dr. Criss Rosales assessment and plan.  Patient is a 63 year old gentleman history of metastatic cholangiocarcinoma, portal vein thrombosis not on anticoagulation due to GI bleed, biliary obstruction secondary to malignancy with percutaneous drainage, type 2 diabetes, liver cirrhosis, history of substance abuse in remission presented to the ED with lightheadedness, hypotension and syncopal episode.  Patient was seen in the ED noted to be tachycardic with heart rates in the 120s, blood pressure of 82/61, CT head negative, chest x-ray with no acute cardiopulmonary disease.  CBC with a leukocytosis of white count of 46.5, hemoglobin of 7.7.  Patient placed on IV fluids and pancultured.  Patient placed empirically on IV antibiotics.  1 hypotension with syncope Patient presented with lightheadedness and syncope on standing found to be hypotensive.  Questionable etiology.  Patient pancultured.  Patient on IV fluids.  Continue empiric IV antibiotics.  Supportive care.  2.  Systemic inflammatory response syndrome Patient presented with syncopal episode noted to be hypotensive and tachycardic with elevated lactic acid level and marked leukocytosis.  Patient pancultured.  COVID-19 negative.  Continue IV fluids, IV antibiotics, trend lactic acid, supportive care.  3.  Metastatic cholangiocarcinoma Patient had cycle 5 of FOLFIRINOX on 05/07/2018, received pegfilgrastim on 05/09/2018.  Oncology following.  4.  Microcytic anemia Hemoglobin of 7.7.  May be secondary to chemotherapy.  Hemoglobin was 10.1 a week earlier.  Patient denies any overt bleeding.  Anemia panel pending.  FOBT pending.  Follow H&H.  Transfusion threshold hemoglobin less than 7.  If FOBT is positive may need further evaluation by GI.  Continue PPI twice daily.  5.  Hyponatremia  likely secondary to hypovolemic hyponatremia.  Continue IV fluids.  Follow.  6.  Diabetes mellitus type 2 Hemoglobin A1c 4.9  in November 2019.  Continue sliding scale insulin.  No charge.

## 2018-05-12 NOTE — Progress Notes (Addendum)
HEMATOLOGY-ONCOLOGY PROGRESS NOTE  SUBJECTIVE: Matthew Lewis is a 63 year old male with Metastatic cholangiocarcinoma. He is currently on treatment with FOLFOX. Last cycle given on 05/07/2018. He received pegfilgrastim with his treatment. Now admitted with hypotension, , weakness, and syncope. The patient states that he feel at home prior to admission. Work-up in the ER showed that the patient was tachycardic. Noncontrast head CT is negative for acute intracranial abnormality and cervical spine CT is negative for acute osseous abnormality.  Chest x-ray demonstrates the known left basilar metastases without acute cardiopulmonary disease.  Radiographs of the lumbar spine are notable for degenerative changes without acute abnormality.  Chemistry panel features a hyponatremia and albumin of 2.3.  CBC is notable for leukocytosis to 46,500 and a macrocytic anemia with hemoglobin of 7.7, down from 10.1 a week earlier.  Lactic acid elevated elevated to 3.6. He was started on IVF, pan-cultured, and started on IV antibiotics.   When seen today, the patient reports that he feels better overall. Having back pain. Denies fevers and chills. Denies bleeding. No other complaints today.  Oncology History   Cancer Staging Intrahepatic cholangiocarcinoma ([place]) Staging form: Intrahepatic Bile Duct, AJCC 8th Edition - Clinical stage from 10/29/2017: Stage IV (cT2, cN1, pM1) - Signed by [name], MD on 11/06/2017       Malignant neoplasm metastatic to adrenal gland [facility])    Initial Diagnosis    Malignant neoplasm metastatic to adrenal gland ([place])    10/23/2017 Imaging    ABD US IMPRESSION: Cirrhotic appearance of the liver.  2.6 cm questionable ill-defined mass versus area of focal parenchymal heterogeneity in segment 6 of the liver. If further imaging evaluation is desired, liver protocol abdominal MRI should be considered.  Splenomegaly.  Apparent thickening of the gallbladder wall, without evidence of  cholelithiasis, and with negative sonographic Murphy's sign. The findings are favored to represent gallbladder wall edema secondary to systemic causes such as hyperproteinemia rather than acute cholecystitis. Please correlate clinically.     10/25/2017 Imaging    MRI IMPRESSION: 1. Advanced changes of cirrhosis. Interval development of multifocal enhancing liver lesions, which is concerning for multicentric hepatocellular carcinoma involving both lobes of liver. 2. Lesion within segment 4 B obstructs the right medial hepatic duct and left main duct resulting in moderate to marked biliary ductal dilatation within the left lobe and dome of liver. There may be partial obstruction of the right lateral duct with mild dilatation. 3. Tumor thrombus within the left branch of the portal vein likely secondary to segment 4 B tumor. 4. Bilateral adrenal metastasis and retroperitoneal nodal metastasis. 5. Stigmata of portal venous hypertension including varices, ascites and splenomegaly. 6. These results will be called to the ordering clinician or representative by the Radiologist Assistant, and communication documented in the PACS or zVision Dashboard.    10/26/2017 Procedure    IMPRESSION: Status post formal PTC and internal/external biliary drainage.    10/27/2017 Imaging    IMPRESSION: Changes of cirrhosis. Multiple hepatic lesions are noted concerning for multifocal hepatoma or metastases. Biliary drain is in place with the tip in the distal common bile duct. There is mild intrahepatic biliary ductal dilatation. Bilateral adrenal nodules and masses, right larger than left compatible with metastases. Retroperitoneal adenopathy. Large stool burden with mild gaseous distention of the colon. Aortic atherosclerosis.     10/29/2017 Pathology Results    Diagnosis Soft Tissue Needle Core Biopsy, right adrenal mass - METASTATIC ADENOCARCINOMA, SEE COMMENT. Microscopic Comment There are malignant  glands with  central necrosis. Immunohistochemistry is positive for cytokeratin 20 and CDX-2. Cytokeratin 7, TTF-1 and NapsinA are negative. The morphology and immunoprofile favor a colorectal primary over pancreaticobiliary. [name] has reviewed the case. [name] was notified on 10/31/2017.    11/04/2017 Procedure    Colonoscopy impression - Seton in place found on digital rectal exam. - Congested mucosa in the rectum, in the sigmoid colon and in the descending colon. - A few diminutive polyps in the rectum and in the sigmoid colon. No colon or rectal mass identified. - No specimens collected.    02/19/2018 Imaging    CT CAP IMPRESSION: 1. Slight interval increase in size of left adrenal gland metastasis. Similar-appearing right adrenal gland metastasis. 2. While difficult to measure given ill-defined nature, infiltrative mass within the left hepatic lobe is grossly similar when compared to prior exam. Additional lesions within the liver are grossly similar. 3. Interval insertion of biliary drain within the left hepatic lobe with resultant decompression of the left hepatic lobe biliary system. 4. Similar-appearing sharply marginated consolidation within the right lower lobe which may represent infectious/inflammatory process or metastatic disease. 5. Large amount of stool throughout the colon as can be seen with constipation. 6. Persistent left hepatic lobe portal venous thrombosis. 7. Splenomegaly. 8. New moderate volume ascites.     Intrahepatic cholangiocarcinoma ([place])   10/27/2017 Imaging    10/27/2017 CT AP IMPRESSION: Changes of cirrhosis. Multiple hepatic lesions are noted concerning for multifocal hepatoma or metastases.  Biliary drain is in place with the tip in the distal common bile duct. There is mild intrahepatic biliary ductal dilatation.  Bilateral adrenal nodules and masses, right larger than left compatible with metastases.  Retroperitoneal  adenopathy.  Large stool burden with mild gaseous distention of the colon.  Aortic atherosclerosis.    10/29/2017 Cancer Staging    Staging form: Intrahepatic Bile Duct, AJCC 8th Edition - Clinical stage from 10/29/2017: Stage IV (cT2, cN1, pM1) - Signed by [name], MD on 11/06/2017    10/29/2017 Pathology Results    10/29/2017 Surgical Pathology Diagnosis Soft Tissue Needle Core Biopsy, right adrenal mass - METASTATIC ADENOCARCINOMA, SEE COMMENT.    11/06/2017 Initial Diagnosis    Intrahepatic cholangiocarcinoma ([place])    11/06/2017 Imaging    11/06/2017 CT AP IMPRESSION: 1. Increase in RIGHT pleural effusion. Stable masslike consolidation at the RIGHT lung base representing neoplasm versus pneumonia. 2. Stable metastatic nodule at the RIGHT lung base. 3. Infiltrative mass in the central LEFT hepatic lobe and discrete lesions in the RIGHT hepatic lobe consistent with metastatic disease versus primary hepatic biliary carcinoma. Favor cholangiocarcinoma. 4. Percutaneous drainage catheter extends the porta hepatis. Small trapped pneumothorax along the tract of the catheter at the inferolateral RIGHT lung base. 5. Stable adrenal metastasis. 6. Metastatic adenopathy in the retroperitoneum unchanged. 7. Morphologic changes in liver consistent with  cirrhosis.    11/08/2017 Imaging    11/08/2017 CT AP IMPRESSION: 1. Overall no significant interval change since the CT of 11/06/2017. Stable positioning of the percutaneous biliary drain with similar degree of biliary ductal dilatation. 2. Cirrhosis with multiple hepatic metastatic disease. Infiltrative appearing lesion in the left lobe of the liver appears to extend into the left portal vein and may represent a tumor thrombus, or bland thrombus. A primary biliary neoplasm (cholangiocarcinoma, or metastatic disease are other considerations. 3. Retroperitoneal, mesenteric, and adrenal metastatic disease. 4. Hepatic colopathy  versus less likely segmental colitis of the hepatic flexure. 5. Small right pleural effusion and bilateral lung  base pulmonary masses/nodules as seen previously. 6. Tiny pockets of pneumoperitoneum lateral to the liver along the percutaneous drain, decreased since the prior CT. 7. Other findings as above.    11/20/2017 - 11/24/2017 Hospital Admission    Admit date: 11/08/2017 Admission diagnosis: fatigue Additional comments: discharged 11/24/2017    12/18/2017 -  Chemotherapy    first line chemo gemcitabine/cisplatin on days 1 and 15 every 28 days starting 12/18/17    12/22/2017 - 12/30/2017 Hospital Admission    Admit date: 12/22/2017 Admission diagnosis: Dysuria    12/22/2017 Imaging    12/22/2017 CT AP IMPRESSION: 1. Prominently distended and otherwise normal urinary bladder with mild prostatomegaly. Findings suggest acute bladder outlet obstruction. No hydronephrosis. 2. Large colorectal stool volume compatible with constipation. New mild circumferential rectal wall thickening and perirectal fat haziness suggesting stercoral colitis. No free air. No abscess. 3. Stable infiltrative cholangiocarcinoma in the central left liver lobe with associated peripheral intrahepatic biliary ductal dilatation throughout the left liver lobe. 4. Stable well-positioned internal external drain traversing the right liver lobe and decompressing the right liver biliary system. 5. Stable liver, bilateral adrenal, retroperitoneal, left mesenteric and right infrahilar nodal and left lung base metastases. No progressive metastatic disease since 12/03/2017 CT. 6. Cirrhosis. Trace perihepatic ascites, decreased. Stable moderate splenomegaly. 7.  Aortic Atherosclerosis (ICD10-I70.0).    03/11/2018 -  Chemotherapy    The patient had palonosetron (ALOXI) injection 0.25 mg, 0.25 mg, Intravenous,  Once, 4 of 6 cycles Administration: 0.25 mg (03/26/2018), 0.25 mg (04/09/2018), 0.25 mg (04/23/2018), 0.25 mg  (05/07/2018) pegfilgrastim (NEULASTA) injection 6 mg, 6 mg, Subcutaneous, Once, 4 of 6 cycles Administration: 6 mg (04/11/2018), 6 mg (04/25/2018), 6 mg (05/09/2018) leucovorin 840 mg in sodium chloride 0.9 % 250 mL infusion, 400 mg/m2 = 840 mg, Intravenous,  Once, 5 of 7 cycles Administration: 840 mg (03/11/2018), 840 mg (03/26/2018), 840 mg (04/09/2018), 840 mg (04/23/2018), 840 mg (05/07/2018) oxaliplatin (ELOXATIN) 150 mg in dextrose 5 % 500 mL chemo infusion, 71 mg/m2 = 145 mg (100 % of original dose 70 mg/m2), Intravenous,  Once, 3 of 5 cycles Dose modification: 70 mg/m2 (original dose 70 mg/m2, Cycle 2, Reason: Provider Judgment, Comment: thrombocytopenia ) Administration: 150 mg (03/26/2018), 150 mg (04/09/2018), 150 mg (05/07/2018) fluorouracil (ADRUCIL) 4,200 mg in sodium chloride 0.9 % 66 mL chemo infusion, 2,000 mg/m2 = 4,200 mg (100 % of original dose 2,000 mg/m2), Intravenous, 1 Day/Dose, 5 of 7 cycles Dose modification: 2,000 mg/m2 (original dose 2,000 mg/m2, Cycle 1, Reason: Provider Judgment) Administration: 4,200 mg (03/11/2018), 5,000 mg (03/26/2018), 5,000 mg (04/09/2018), 5,000 mg (04/23/2018), 5,000 mg (05/07/2018)  for chemotherapy treatment.       REVIEW OF SYSTEMS:   Constitutional: Denies fevers, chills. Reports fatigue and weakness.  Eyes: Denies blurriness of vision Ears, nose, mouth, throat, and face: Denies mucositis or sore throat Respiratory: Denies cough, dyspnea or wheezes Cardiovascular: Denies palpitation, chest discomfort Gastrointestinal:  Has intermittent nausea. Vomited X1 at home. Denies diarrhea. Percutaneous drains in place. R drains more than L. Skin: Denies abnormal skin rashes Lymphatics: Denies new lymphadenopathy or easy bruising Neurological:Denies numbness, tingling or new weaknesses. Had syncopal episode at home. Behavioral/Psych: Mood is stable, no new changes  Extremities: No lower extremity edema All other systems were reviewed with the patient and are  negative.  I have reviewed the past medical history, past surgical history, social history and family history with the patient and they are unchanged from previous note.   PHYSICAL EXAMINATION:  Vitals:  05/12/18 0428 05/12/18 0800  BP: 98/67   Pulse: (!) 129 (!) 121  Resp: 16 16  Temp:  98.3 F (36.8 C)  SpO2: 98% 94%   There were no vitals filed for this visit.  Intake/Output from previous day: 05/04 0701 - 05/05 0700 In: 1600 [I.V.:1500; IV Piggyback:100] Out: 300 [Drains:150]  GENERAL:alert, no distress and comfortable SKIN: skin color, texture, turgor are normal, no rashes or significant lesions EYES: normal, Conjunctiva are pink and non-injected, sclera clear OROPHARYNX:no exudate, no erythema and lips, buccal mucosa, and tongue normal  NECK: supple, thyroid normal size, non-tender, without nodularity LYMPH:  no palpable lymphadenopathy in the cervical, axillary or inguinal LUNGS: clear to auscultation and percussion with normal breathing effort HEART: Tachycardic, murmurs and no lower extremity edema ABDOMEN:abdomen soft, non-tender and normal bowel sounds. Percutaneous drains draining bilious drainage.  Musculoskeletal:no cyanosis of digits and no clubbing  NEURO: alert & oriented x 3 with fluent speech, no focal motor/sensory deficits  LABORATORY DATA:  I have reviewed the data as listed CMP Latest Ref Rng & Units 05/12/2018 05/07/2018 05/01/2018  Glucose 70 - 99 mg/dL 172(H) 244(H) 188(H)  BUN 8 - 23 mg/dL _0 Creatinine 0.61 - 1.24 mg/dL 0.70 0.77 0.76  Sodium 135 - 145 mmol/L 132(L) 133(L) 133(L)  Potassium 3.5 - 5.1 mmol/L 4.5 3.4(L) 3.4(L)  Chloride 98 - 111 mmol/L 103 100 99  CO2 22 - 32 mmol/L 20(L) 24 22  Calcium 8.9 - 10.3 mg/dL 7.7(L) 8.8(L) 9.1  Total Protein 6.5 - 8.1 g/dL 5.9(L) 7.8 8.1  Total Bilirubin 0.3 - 1.2 mg/dL 1.1 1.0 0.9  Alkaline Phos 38 - 126 U/L 206(H) 263(H) 401(H)  AST 15 - 41 U/L 44(H) 40 39  ALT 0 - 44 U/L 35 34 35    Lab  Results  Component Value Date   WBC 46.5 (H) 05/12/2018   HGB 7.7 (L) 05/12/2018   HCT 24.2 (L) 05/12/2018   MCV 104.3 (H) 05/12/2018   PLT 181 05/12/2018   NEUTROABS 40.5 (H) 05/12/2018    Dg Chest 1 View  Result Date: 05/12/2018 CLINICAL DATA:  Fall, syncope, hypotension EXAM: CHEST  1 VIEW COMPARISON:  02/24/2018.  Chest CT 02/19/2018 FINDINGS: Right Port-A-Cath in place with the tip in the right atrium. Low lung volumes. Heart is normal size. Previously seen left lower lobe/basilar lung metastasis not as well visualized as on prior CT. No acute airspace consolidation or effusion. No acute bony abnormality. IMPRESSION: Previously seen left basilar metastasis better seen on prior CT. No new acute cardiopulmonary process. Electronically Signed   By: Rolm Baptise M.D.   On: 05/12/2018 02:43   Dg Lumbar Spine Complete  Result Date: 05/12/2018 CLINICAL DATA:  Hypotension.  Syncope, fall EXAM: LUMBAR SPINE - COMPLETE 4+ VIEW COMPARISON:  None. FINDINGS: Lumbar straightening. Diffuse advanced degenerative disc and facet disease. 7 mm of retrolisthesis of L3 on L4. 11 mm retrolisthesis of L4 on L5. 6 mm anterolisthesis of L5 on S1. No fracture. SI joints symmetric and unremarkable. IMPRESSION: Degenerative disc and facet disease as above with associated multilevel listhesis. No acute bony abnormality. Electronically Signed   By: Rolm Baptise M.D.   On: 05/12/2018 02:44   Ct Head Wo Contrast  Result Date: 05/12/2018 CLINICAL DATA:  Headache, hypotension, fall, syncope EXAM: CT HEAD WITHOUT CONTRAST CT CERVICAL SPINE WITHOUT CONTRAST TECHNIQUE: Multidetector CT imaging of the head and cervical spine was performed following the standard protocol without intravenous contrast. Multiplanar CT image  reconstructions of the cervical spine were also generated. COMPARISON:  None. FINDINGS: CT HEAD FINDINGS Brain: No acute intracranial abnormality. Specifically, no hemorrhage, hydrocephalus, mass lesion, acute  infarction, or significant intracranial injury. Vascular: No hyperdense vessel or unexpected calcification. Skull: No acute calvarial abnormality. Sinuses/Orbits: Visualized paranasal sinuses and mastoids clear. Orbital soft tissues unremarkable. Other: None CT CERVICAL SPINE FINDINGS Alignment: No subluxation Skull base and vertebrae: No acute fracture. No primary bone lesion or focal pathologic process. Soft tissues and spinal canal: No prevertebral fluid or swelling. No visible canal hematoma. Disc levels: Prior anterior fusion from C5-T1. Degenerative disc disease at C4-5 with anterior spurring. Mild diffuse degenerative facet disease. Upper chest: No acute findings Other: Carotid artery calcifications. IMPRESSION: No acute intracranial abnormality. Postoperative changes and degenerative changes in the cervical spine. No acute bony abnormality. Electronically Signed   By: Rolm Baptise M.D.   On: 05/12/2018 03:01   Ct Cervical Spine Wo Contrast  Result Date: 05/12/2018 CLINICAL DATA:  Headache, hypotension, fall, syncope EXAM: CT HEAD WITHOUT CONTRAST CT CERVICAL SPINE WITHOUT CONTRAST TECHNIQUE: Multidetector CT imaging of the head and cervical spine was performed following the standard protocol without intravenous contrast. Multiplanar CT image reconstructions of the cervical spine were also generated. COMPARISON:  None. FINDINGS: CT HEAD FINDINGS Brain: No acute intracranial abnormality. Specifically, no hemorrhage, hydrocephalus, mass lesion, acute infarction, or significant intracranial injury. Vascular: No hyperdense vessel or unexpected calcification. Skull: No acute calvarial abnormality. Sinuses/Orbits: Visualized paranasal sinuses and mastoids clear. Orbital soft tissues unremarkable. Other: None CT CERVICAL SPINE FINDINGS Alignment: No subluxation Skull base and vertebrae: No acute fracture. No primary bone lesion or focal pathologic process. Soft tissues and spinal canal: No prevertebral fluid or  swelling. No visible canal hematoma. Disc levels: Prior anterior fusion from C5-T1. Degenerative disc disease at C4-5 with anterior spurring. Mild diffuse degenerative facet disease. Upper chest: No acute findings Other: Carotid artery calcifications. IMPRESSION: No acute intracranial abnormality. Postoperative changes and degenerative changes in the cervical spine. No acute bony abnormality. Electronically Signed   By: Rolm Baptise M.D.   On: 05/12/2018 03:01   Ir Fluoro Rm 30-60 Min  Result Date: 04/15/2018 INDICATION: Patient with history of cholangiocarcinoma and chronic bilateral internal external biliary drains. He now presents with persistent leakage from right biliary drain as well as detachment of Stat Lock device. EXAM: FLUOROSCOPIC EXAM OF BILIARY DRAINS MEDICATIONS: None ANESTHESIA/SEDATION: None COMPLICATIONS: None immediate. PROCEDURE: Fluoroscopic exam of biliary drains reveals both catheter tips in duodenum and appropriately positioned. New Stat Lock devices were applied over both drains and both drains were placed to gravity bag drainage. Both drains were flushed with sterile normal saline without difficulty. IMPRESSION: Appropriately positioned bilateral internal external biliary drainage catheters; new StatLock devices were applied over both drains and both drains were placed to gravity bag drainage. Patient instructed to continue once daily flushing of drains with sterile normal saline. He is scheduled for routine bilateral biliary drain exchanges on 05/29/2018. Read by: Rowe Robert, PA-C Electronically Signed   By: Aletta Edouard M.D.   On: 04/15/2018 15:15   Ir Cholangiogram Existing Tube  Result Date: 05/04/2018 INDICATION: 63 year old male with a history of cholangiocarcinoma status post both right and left percutaneous biliary drains. He has required frequent drain manipulations due to leaking from his right-sided drainage catheter. Most recently he was evaluated on 04/28/2018 and  the right-sided drainage catheter was upsized to 12 [place]. He continues to experience leaking from the drain site, particularly when he first wakes  up in the morning and becomes more active. He has to changes bandages multiple times during the day. He is currently flushing the drainage catheter once per day, typically in the evening after dinner. He denies any pain or discomfort. His tube has not pulled back. EXAM: CHOLANGIOGRAM VIA EXISTING CATHETER MEDICATIONS: None ANESTHESIA/SEDATION: None FLUOROSCOPY TIME:  Fluoroscopy Time: 0 minutes 18 seconds (3 mGy). COMPLICATIONS: None immediate. PROCEDURE: Informed written consent was obtained from the patient after a thorough discussion of the procedural risks, benefits and alternatives. All questions were addressed. A timeout was performed prior to the initiation of the procedure. The tube was evaluated fluoroscopically. The side-hole marker appears to be in good position. The tube is well formed. The left-sided tube remains in unchanged position. A gentle hand injection of contrast material was performed. The tube is widely patent. Contrast material opacifies the biliary tree and duodenum. The tube is working well. There is no evidence of biliary ductal dilatation to suggest undrained segments of the biliary tree. No evidence of reflux of contrast material along the catheter tubing and to the skin surface. The catheter was gently flushed and secured to the skin with a new stay-fix device. IMPRESSION: 1. Well-positioned and normally functioning 12 [place] right-sided percutaneous biliary drain. PLAN: I can see no benefit to exchanging this normally functioning and relatively new drainage catheter at this time. After discussing his flushing schedule and the timing of his leaks, I advised him to begin flushing his drainage catheter as soon as he wakes up in the morning before becoming active in order to clear any blockage and facilitate maximal drainage during the time  that he typically leaks. Additionally, we discussed potentially flushing twice per day in an effort to keep the tube free from debris and draining well. He has had no issues with his left-sided tube and would like to consider a capping trial. We capped his left-sided tube. Signed, Criselda Peaches, MD, [facility] Electronically Signed   By: Jacqulynn Cadet M.D.   On: 05/04/2018 16:01   Ir Exchange Biliary Drain  Result Date: 04/28/2018 INDICATION: History of cholangiocarcinoma with prior percutaneous biliary drainage of both right-sided and left-sided intrahepatic bile ducts for biliary obstruction with placement of right-sided 10 French internal/external biliary drainage catheter and left-sided 10 French internal/external biliary drainage catheter. The right-sided catheter was exchanged on 04/23/2018 due to partial retraction and bile leak around the exit site of the catheter. There remains bile leak around this catheter as well as pain at the exit site felt to be related to the retention suture. The left-sided biliary drain also has now stopped draining and flushing of this catheter results in saline exiting the skin at the catheter entrance. EXAM: 1. EXCHANGE AND UP SIZING OF RIGHT-SIDED PERCUTANEOUS INTERNAL/EXTERNAL BILIARY DRAINAGE CATHETER 2. EXCHANGE OF LEFT-SIDED PERCUTANEOUS INTERNAL/EXTERNAL BILIARY DRAINAGE CATHETER MEDICATIONS: 2 g IV Ancef; The antibiotic was administered within an appropriate time frame prior to the initiation of the procedure. ANESTHESIA/SEDATION: Moderate (conscious) sedation was employed during this procedure. A total of Versed 0.5 mg and Fentanyl 50 mcg was administered intravenously. Moderate Sedation Time: 25 minutes. The patient's level of consciousness and vital signs were monitored continuously by radiology nursing throughout the procedure under my direct supervision. CONTRAST:  20 mL Omnipaque 300  FLUOROSCOPY TIME:  Fluoroscopy Time: 3 minutes and 18 seconds. 62.0 mGy. COMPLICATIONS: None immediate. PROCEDURE: Informed written consent was obtained from the patient after a thorough discussion of the procedural  risks, benefits and alternatives. All questions were addressed. Maximal Sterile Barrier Technique was utilized including caps, mask, sterile gowns, sterile gloves, sterile drape, hand hygiene and skin antiseptic. A timeout was performed prior to the initiation of the procedure. The left hepatic internal/external biliary drainage catheter was attempted to be injected with contrast material. The catheter was then cut and removed over a guidewire. A 5 [place] the catheter was advanced over the wire. This catheter was further advanced into the left intrahepatic biliary tree under fluoroscopy and ultimately through the common bile duct and into the duodenum over a guidewire. A new 10 French internal/external biliary drainage catheter was advanced over the wire and formed in the duodenum. Positioning within the left lobe of the liver was confirmed with a fluoroscopic spot image obtained after injection of contrast. The catheter was flushed with saline and connected to a new gravity drainage bag. The pre-existing 10 [place] internal/external biliary drainage catheter in the right lobe of the liver was then injected with contrast material, cut and removed over a guidewire. A new 12 French internal/external biliary drainage catheter was then advanced and formed in the duodenum. Catheter position was confirmed by a fluoroscopic spot image obtained after injection of contrast. The catheter was flushed with saline and connected to a new gravity drainage bag. Both catheters were secured at the skin with StatLock devices. FINDINGS: The pre-existing left-sided biliary drainage catheter has migrated nearly completely out of the left lobe of the liver. The tube could also not be flushed. Access through the left-sided  bile ducts was able to be gained with a catheter allowing replacement of the left-sided biliary drain. A new 10 French drain was formed in the duodenum with sideholes extending up into the left lobe of the liver. Due to persistent leakage of bile around the exit site of the pre-existing 10 [place] right-sided biliary drain despite recent exchange, decision was made to upsize the tube to 12 [place] today. A new 12 [place] biliary drainage catheter was advanced and formed in the duodenum with sideholes extending up into the right lobe of the liver. Due to skin irritation from previously placed retention sutures, the patient requested that retention sutures not be placed today. IMPRESSION: 1. Exchange of retracted left-sided internal/external biliary drainage catheter which had retracted nearly outside of the liver. A new 10 French catheter was advanced and formed in the duodenum. 2. Exchange and up sizing of right-sided biliary drainage catheter with new 12 French internal/external biliary drainage catheter. 3. The patient will be scheduled for elective exchange of both biliary drains in approximately 10 weeks. Electronically Signed   By: Aletta Edouard M.D.   On: 04/28/2018 11:21   Ir Exchange Biliary Drain  Result Date: 04/28/2018 INDICATION: History of cholangiocarcinoma with prior percutaneous biliary drainage of both right-sided and left-sided intrahepatic bile ducts for biliary obstruction with placement of right-sided 10 French internal/external biliary drainage catheter and left-sided 10 French internal/external biliary drainage catheter. The right-sided catheter was exchanged on 04/23/2018 due to partial retraction and bile leak around the exit site of the catheter. There remains bile leak around this catheter as well as pain at the exit site felt to be related to the retention suture. The left-sided biliary drain also has now stopped draining and flushing of this catheter results in saline exiting the skin  at the catheter entrance. EXAM: 1. EXCHANGE AND UP SIZING OF RIGHT-SIDED PERCUTANEOUS INTERNAL/EXTERNAL BILIARY DRAINAGE CATHETER 2. EXCHANGE OF LEFT-SIDED PERCUTANEOUS INTERNAL/EXTERNAL BILIARY DRAINAGE CATHETER MEDICATIONS: 2  g IV Ancef; The antibiotic was administered within an appropriate time frame prior to the initiation of the procedure. ANESTHESIA/SEDATION: Moderate (conscious) sedation was employed during this procedure. A total of Versed 0.5 mg and Fentanyl 50 mcg was administered intravenously. Moderate Sedation Time: 25 minutes. The patient's level of consciousness and vital signs were monitored continuously by radiology nursing throughout the procedure under my direct supervision. CONTRAST:  20 mL Omnipaque 300 FLUOROSCOPY TIME:  Fluoroscopy Time: 3 minutes and 18 seconds. 62.0 mGy. COMPLICATIONS: None immediate. PROCEDURE: Informed written consent was obtained from the patient after a thorough discussion of the procedural risks, benefits and alternatives. All questions were addressed. Maximal Sterile Barrier Technique was utilized including caps, mask, sterile gowns, sterile gloves, sterile drape, hand hygiene and skin antiseptic. A timeout was performed prior to the initiation of the procedure. The left hepatic internal/external biliary drainage catheter was attempted to be injected with contrast material. The catheter was then cut and removed over a guidewire. A 5 [place] Kumpe the catheter was advanced over the wire. This catheter was further advanced into the left intrahepatic biliary tree under fluoroscopy and ultimately through the common bile duct and into the duodenum over a guidewire. A new 10 French internal/external biliary drainage catheter was advanced over the wire and formed in the duodenum. Positioning within the left lobe of the liver was confirmed with a fluoroscopic spot image obtained after injection of contrast. The catheter was flushed with saline and connected to a new gravity  drainage bag. The pre-existing 10 [place] internal/external biliary drainage catheter in the right lobe of the liver was then injected with contrast material, cut and removed over a guidewire. A new 12 French internal/external biliary drainage catheter was then advanced and formed in the duodenum. Catheter position was confirmed by a fluoroscopic spot image obtained after injection of contrast. The catheter was flushed with saline and connected to a new gravity drainage bag. Both catheters were secured at the skin with StatLock devices. FINDINGS: The pre-existing left-sided biliary drainage catheter has migrated nearly completely out of the left lobe of the liver. The tube could also not be flushed. Access through the left-sided bile ducts was able to be gained with a catheter allowing replacement of the left-sided biliary drain. A new 10 French drain was formed in the duodenum with sideholes extending up into the left lobe of the liver. Due to persistent leakage of bile around the exit site of the pre-existing 10 [place] right-sided biliary drain despite recent exchange, decision was made to upsize the tube to 12 [place] today. A new 12 [place] biliary drainage catheter was advanced and formed in the duodenum with sideholes extending up into the right lobe of the liver. Due to skin irritation from previously placed retention sutures, the patient requested that retention sutures not be placed today. IMPRESSION: 1. Exchange of retracted left-sided internal/external biliary drainage catheter which had retracted nearly outside of the liver. A new 10 French catheter was advanced and formed in the duodenum. 2. Exchange and up sizing of right-sided biliary drainage catheter with new 12 French internal/external biliary drainage catheter. 3. The patient will be scheduled for elective exchange of both biliary drains in approximately 10 weeks. Electronically Signed   By: Aletta Edouard M.D.   On: 04/28/2018 11:21   Ir Exchange  Biliary Drain  Result Date: 04/23/2018 INDICATION: 63 year old male with a history of cholangiocarcinoma requiring bilateral percutaneous biliary drain placement. He presents from the cancer center with complaints of continuous leakage from  around his right-sided biliary drain. EXAM: Biliary drain exchange MEDICATIONS: None ANESTHESIA/SEDATION: None FLUOROSCOPY TIME:  Fluoroscopy Time: 1 minutes 36 seconds (54 mGy). COMPLICATIONS: None immediate. PROCEDURE: Informed written consent was obtained from the patient after a thorough discussion of the procedural risks, benefits and alternatives. All questions were addressed. Maximal Sterile Barrier Technique was utilized including caps, mask, sterile gowns, sterile gloves, sterile drape, hand hygiene and skin antiseptic. A timeout was performed prior to the initiation of the procedure. The drains were evaluated fluoroscopically. Both the left and right drain have significantly pulled back compared to the time of prior placement. The sideholes of the right-sided drainage catheter are partially out of the biliary tree and within the hepatic parenchyma. This is likely the source of the patient's leak. The left drain remains asymptomatic and has no issues with leak. After discussion with the patient, he is excepting of pursuing exchange of the right-sided biliary drain but would not like to have the left biliary drain exchanged given the fact that it is not causing him any trouble at this time. The existing catheter was cut and removed over a wire. A Kumpe the catheter was advanced over the wire and used to navigate the wire back into the duodenum. A new 10 French percutaneous biliary drainage catheter was then advanced over the wire and formed with the locking loop in the duodenum. The patient was given instructions to maintain his gravity bag no lower than his thigh. He has been wearing it on his ankle which may be creating traction on the tube in facilitating the tube  pulling back. He has had multiple drain exchanges over the past several weeks. The new drainage catheter was affixed to the skin with an 0 Prolene suture following satisfactory local anesthesia by infiltration with 1% lidocaine. The patient tolerated the procedure well. IMPRESSION: Successful exchange of pulled back right-sided percutaneous internal/external 10 French biliary drainage catheter. The catheter was secured to the skin with 0 Prolene suture. Electronically Signed   By: Jacqulynn Cadet M.D.   On: 04/23/2018 16:19    ASSESSMENT AND PLAN: 1. Metastatic cholangiocarcinoma 2. Hypotension and syncope 3. Leukocytosis - may be in part related to recent Neulasta. 4. Anemia - Has history of GI bleed. Anemia may be due in part to recent chemo. 5. Obstructive jaundice 6. Biliary vein thrombosis  -Continue IVF -Work-up for infection pending.  -Continue antibiotics and modify based on cultures. -Monitor CBC. -Stool for occult blood pending. Continue Protonix. Transfuse for Hemoglobin < 7.0 or active bleeding. May need GI evaluation if heme + stools.    LOS: 0 days   Mikey Bussing, DNP, AGPCNP-BC, AOCNP 05/12/18  Addendum  I have seen the patient, examined him. I agree with the assessment and and plan and have edited the notes.   Pt is well known to me, under my care for his metastatic cholangiocarcinoma.  He is on second line chemotherapy.  He has been having mild hypotension in the past few weeks, due to high output from the biliary drain tubes.  He was admitted for syncope, likely second to hypotension.  No fever, chills or significant abdominal pain.  I think thhe is likely related to dehydration from high bilary drainage output and low oral intake after chemo, leukocytosis is probably from G-CSF.  Infection and sepsis is certainly possible, but less likely. Agree with treatment plan, will f/u. If cultures negative, and his BP normalized after hydration, OK to discharge home and we will  f/u him closely.  Please ask IR to see if anything needed for his right biliary tube leakage.   [name]  05/12/2018

## 2018-05-12 NOTE — ED Notes (Signed)
Pt again attempted to stand with x2 staff but pt got very dizzy, began shaking and minimally responsive. Lattie Haw, [place] aware of occurrence.

## 2018-05-12 NOTE — Progress Notes (Addendum)
eLink Physician-Brief Progress Note Patient Name: Matthew Lewis DOB: 10/28/1955 MRN: [id number]   Date of Service  05/12/2018  HPI/Events of Note  Pt admitted earlier today for possible sepsis. He has a history of cholangiocarcinoma. Started vomiting about 45 minutes ago and has had about 250 ml of bright red blood with clots out. He has also been hypotensive. He is also complaining of back pain which is new since yesterday.  eICU Interventions  5 % Albumin 500 ml iv x1, type and cross for 4 units PRBC and transfuse first unit immediately, Protonix 80 mg iv x 1 then Protonix infusion at 8 mg/hr, H & H Q 4 hours, Stat GI consultation, NPO. I spoke with Dr. Silvano Rusk at 9:45 pm. He will see patient. Arterial line. CT abdomen/pelvis r/o acute abdomen.        Kerry Kass Talley Casco 05/12/2018, 9:35 PM

## 2018-05-12 NOTE — Progress Notes (Signed)
A consult was received from an ED physician for Vancomycin, Meropenem per pharmacy dosing.  The patient's profile has been reviewed for ht/wt/allergies/indication/available labs.   A one time order has been placed for Vancomycin 1500mg  iv x1, and Meropenem 1gm iv x1.  Further antibiotics/pharmacy consults should be ordered by admitting physician if indicated.                       Thank you, Nani Skillern Crowford 05/12/2018  4:54 AM

## 2018-05-12 NOTE — ED Provider Notes (Signed)
[facility] DEPT Provider Note   CSN: [id number] Arrival date & time: 05/12/18  0145    History   Chief Complaint Chief Complaint  Patient presents with   Loss of Consciousness   Hypotension    HPI Matthew Lewis is a 63 y.o. male.     The history is provided by the patient and medical records.  Loss of Consciousness  Associated symptoms: weakness (generalized)      63 year old male with history of stage IV intrahepatic cholangiocarcinoma with mets to right adrenal, liver, lungs, lymph nodes, history of GI bleeding, hypertension, diabetes, PTSD, history of cirrhosis secondary to alcohol and NASH, presenting to the ED for hypotension and syncope.  Patient was at home with his wife and apparently had 2 falls this evening after syncopal events.  States he fell straight backwards and did strike his back and head on the floor.  There was no loss of consciousness.  Patient initially refused EMS to come, however after second fall wife called anyway.  Patient was very unsteady on his feet with EMS, still very weak and nearly falling down.  They attempted orthostatics but patient not able to stand.  He has received 1500 cc normal saline by time of arrival in the ED and still hypotensive into the 95G systolic.  He states this is somewhat common for him due to high output from his biliary drains.  He denies any noted fevers.  He has not had any chest pain or shortness of breath.  He does have a slight cough but denies production of sputum.  He denies any sick contacts or known COVID exposures, states he only goes to doctors appointments and at home.  He lives at home with his wife.  He does have 2 biliary drains in place, one on his right side continues to leak which is chronic.  Drains were changed 04/28/18, he changes dressings every 4 hours or so.  He is followed by oncology, [name].  Currently receiving chemotherapy with FOLFOX and Oxaliplatin.  He also get  Leucovorin injections, last dose Saturday, 5/2.  Patient is DNR as of 05/07/18 appt with heme/onc.  Past Medical History:  Diagnosis Date   Abscess of anal and rectal regions    horse shoe abscess   Alcoholic cirrhosis ([place])    Arthritis    "everywhere; especially in my spine"   Cirrhosis ([place])    STAGE 1 CIRRHOSIS PATIENT SEES DR NAMDIGAM FOR   Colon cancer ([place]) 05/2005   TX SURGERY WITH LYMPH NODE REMOVAL   Cough    LAST FEW WEEKS SAW DR RAMOS LUNGS MILKY SPUTUM [place]   DM type 2 with diabetic peripheral neuropathy ([place])    left foot   Fatigue    Fistula, anal    multiple   History of substance abuse ([place])    last alcohol was 08/2011; last marijuania was 08/2011   Hypertension    PTSD (post-traumatic stress disorder)    "severe" HX OF   Sleep deprivation     Patient Active Problem List   Diagnosis Date Noted   UGI bleed 02/24/2018   Lactic acidosis 02/24/2018   Acute blood loss anemia 02/24/2018   Port-A-Cath in place 01/28/2018   Dehydration 12/22/2017   Bladder outlet obstruction 12/22/2017   Constipation 12/22/2017   Bladder outflow obstruction    Sepsis due to gram-negative bacteria (HCC)    Severe sepsis with acute organ dysfunction due to Gram negative bacteria ([place]) 12/03/2017  Septic shock ([place])    Cholangiocarcinoma ([place])    Hypotension 11/20/2017   Acute renal failure (ARF) ([place]) 11/20/2017   Palliative care by specialist    Segmental colitis ([place]) 11/09/2017   Elevated lipase 11/09/2017   Intrahepatic cholangiocarcinoma ([place]) 11/06/2017   Goals of care, counseling/discussion 11/06/2017   Cirrhosis ([place])    Malignant neoplasm metastatic to adrenal gland ([place])    Biliary obstruction due to malignant neoplasm ([place]) 10/23/2017   Transaminitis 10/23/2017   Liver lesion 10/23/2017   History of colon cancer 10/23/2017   Perirectal abscess 02/24/2017   Moderate protein-calorie malnutrition ([place]) 02/24/2017   Sepsis  ([place]) 02/24/2017   Pulmonary nodule 16/10/9602   Alcoholic cirrhosis of liver without ascites ([place]) 02/24/2017   Duodenitis 06/13/2016   Hyponatremia 06/12/2016   Nausea & vomiting 06/12/2016   Thrombocytopenia ([place]) 06/12/2016   Acute gastroenteritis 06/12/2016   PTSD (post-traumatic stress disorder) 12/02/2012   Hx of substance abuse- clean several years now 12/02/2012   Chest pain with moderate risk of acute coronary syndrome 11/30/2012   Obesity 11/30/2012   Hypertension 11/30/2012   Hyperlipemia 11/30/2012   Type 2 diabetes mellitus ([place]) 11/30/2012   CAD (coronary artery disease)- PCI 2001- no evaluation since 11/30/2012    Past Surgical History:  Procedure Laterality Date   ABSCESS DRAINAGE     "probably 15-20 so far at least; rectal"   ANAL FISTULECTOMY  12/13/1998   ANTERIOR FUSION CERVICAL SPINE  2010   "triple"; "did 2 surgeries on the same day in 5409 due to complications"   CORONARY ANGIOPLASTY  2001   detached muscle  2010   right chest; "after cervical fusion complications"   ELBOW SURGERY  2007   "cut out part of a muscle"; left   ESOPHAGEAL BANDING  02/25/2018   Procedure: ESOPHAGEAL BANDING;  Surgeon: Ladene Artist, MD;  Location: [facility];  Service: Endoscopy;;   ESOPHAGOGASTRODUODENOSCOPY (EGD) WITH PROPOFOL N/A 02/25/2018   Procedure: ESOPHAGOGASTRODUODENOSCOPY (EGD) WITH PROPOFOL;  Surgeon: Ladene Artist, MD;  [facility] END[facility];  Service: Endoscopy;  Laterality: N/A;   EXAMINATION UNDER ANESTHESIA  03/22/2005   fistula   EXAMINATION UNDER ANESTHESIA  06/06/2011   Procedure: EXAM UNDER ANESTHESIA;  Surgeon: Stark Klein, MD;  Location: [facility];  Service: General;  Laterality: N/A;   HEMICOLECTOMY  06/06/2005   left   INCISION AND DRAINAGE PERIRECTAL ABSCESS  03/30/2005   INCISION AND DRAINAGE PERIRECTAL ABSCESS  12/13/2004   INCISION AND DRAINAGE PERIRECTAL ABSCESS  07/14/2001   INCISION AND DRAINAGE PERIRECTAL ABSCESS   08/12/2010   horseshoe abscess; Dr Redmond Pulling   INCISION AND DRAINAGE PERIRECTAL ABSCESS  06/06/2011   Procedure: IRRIGATION AND DEBRIDEMENT PERIRECTAL ABSCESS;  Surgeon: Stark Klein, MD;  Location: [facility];  Service: General;  Laterality: N/A;   INCISION AND DRAINAGE PERIRECTAL ABSCESS N/A 02/24/2017   Procedure: IRRIGATION AND DEBRIDEMENT PERIRECTAL ABSCESS;  Surgeon: Ralene Ok, MD;  Location: [facility];  Service: General;  Laterality: N/A;   IR CATHETER TUBE CHANGE  02/19/2018   IR CHOLANGIOGRAM EXISTING TUBE  12/23/2017   IR CHOLANGIOGRAM EXISTING TUBE  01/02/2018   IR CHOLANGIOGRAM EXISTING TUBE  05/04/2018   IR EXCHANGE BILIARY DRAIN  11/07/2017   IR EXCHANGE BILIARY DRAIN  12/09/2017   IR EXCHANGE BILIARY DRAIN  02/03/2018   IR EXCHANGE BILIARY DRAIN  02/03/2018   IR EXCHANGE BILIARY DRAIN  04/03/2018   IR EXCHANGE BILIARY DRAIN  04/03/2018   IR EXCHANGE BILIARY DRAIN  04/23/2018   IR  EXCHANGE BILIARY DRAIN  04/28/2018   IR EXCHANGE BILIARY DRAIN  04/28/2018   IR FLUORO RM 30-60 MIN  04/15/2018   IR IMAGING GUIDED PORT INSERTION  12/09/2017   IR INT EXT BILIARY DRAIN WITH CHOLANGIOGRAM  10/26/2017   IR INT EXT BILIARY DRAIN WITH CHOLANGIOGRAM  12/23/2017   IR PARACENTESIS  02/25/2018   IR PATIENT EVAL TECH 0-60 MINS  12/02/2017   IR RADIOLOGIST EVAL & MGMT  11/06/2017   IR RADIOLOGIST EVAL & MGMT  01/05/2018   LUMBAR DISC SURGERY  1987   PERCUTANEOUS PINNING PHALANX FRACTURE OF HAND  ~ 2008   "plates in 2 places"; right   PLACEMENT OF SETON N/A 09/05/2017   Procedure: PLACEMENT OF SETON;  Surgeon: Leighton Ruff, MD;  Location: [facility];  Service: General;  Laterality: N/A;   RADIOLOGY WITH ANESTHESIA N/A 10/25/2017   Procedure: MRI WITH ANESTHESIA;  Surgeon: Radiologist, Medication, MD;  Location: [facility];  Service: Radiology;  Laterality: N/A;   THORACIC DISCECTOMY  1990's        Home Medications    Prior to Admission medications   Medication  Sig Start Date End Date Taking? Authorizing Provider  Ca Carbonate-Mag Hydroxide (ROLAIDS PO) Take 1-2 tablets by mouth 2 (two) times daily as needed (heartburn).    [provider]  fexofenadine (ALLEGRA) 180 MG tablet Take 180 mg by mouth at bedtime.    [provider]  gabapentin (NEURONTIN) 400 MG capsule Take 400-800 mg by mouth See admin instructions. Take 400 mg by mouth in the morning and 800 mg in the evening    [provider]  insulin detemir (LEVEMIR) 100 UNIT/ML injection Inject 10-62 Units into the skin See admin instructions. Take 62 in the am and 10 in the pm    [provider]  lactose free nutrition (BOOST PLUS) LIQD Take 237 mLs by mouth 3 (three) times daily with meals. Patient taking differently: Take 237 mLs by mouth 3 (three) times daily with meals. Ensure chocolate and vanilla max protein 12/11/17   Regalado, Belkys A, MD  levocetirizine (XYZAL) 5 MG tablet Take 5 mg by mouth every evening.    [provider]  lidocaine-prilocaine (EMLA) cream Apply to port site 1 hour prior to access Patient taking differently: Apply 1 application topically once. Apply to port site 1 hour prior to access 01/20/18   [name], MD  Multiple Vitamin (MULTIVITAMIN) tablet Take 1 tablet by mouth daily.    [provider]  pantoprazole (PROTONIX) 40 MG tablet Take 1 tablet (40 mg total) by mouth 2 (two) times daily. 02/27/18   Thurnell Lose, MD  propranolol ER (INDERAL LA) 60 MG 24 hr capsule Take 1 capsule (60 mg total) by mouth daily for 30 days. 02/27/18 03/29/18  Thurnell Lose, MD  Sodium Chloride Flush (NORMAL SALINE FLUSH) 0.9 % SOLN Inject 10 mLs into the vein daily. 04/23/18   [name], MD  Sodium Chloride Flush (NORMAL SALINE FLUSH) 0.9 % SOLN Inject 10 mLs into the vein daily. 04/23/18   [name], MD  tamsulosin (FLOMAX) 0.4 MG CAPS capsule TAKE 1 CAPSULE(0.4 MG) BY MOUTH DAILY AFTER SUPPER 03/12/18   [name], MD    Family  History Family History  Problem Relation Age of Onset   Heart disease Father    Heart disease Mother    Cancer Mother    Cancer Sister    Heart disease Brother     Social History Social  History   Tobacco Use   Smoking status: Former Smoker    Packs/day: 1.00    Years: 10.00    Pack years: 10.00    Types: Cigarettes    Last attempt to quit: 01/08/1991    Years since quitting: 27.3   Smokeless tobacco: Never Used  Substance Use Topics   Alcohol use: Not Currently    Comment: 06/05/11 "drank enough when I was younger to last me my whole life; don't remember when I had my last drink, maybe 2012"   Drug use: Not Currently    Types: Oxycodone, Marijuana    Comment: 06/05/11 "pot head in my younger days; 2009 dr had me on oxycodone and valium for almost 59yr     Allergies   Morphine and related   Review of Systems Review of Systems  Cardiovascular: Positive for syncope.  Neurological: Positive for syncope and weakness (generalized).  All other systems reviewed and are negative.    Physical Exam Updated Vital Signs BP (!) 82/61    Pulse (!) 120    Temp 98.8 F (37.1 C) (Rectal)    Resp 17    SpO2 100%   Physical Exam Vitals signs and nursing note reviewed.  Constitutional:      Comments: Awake, alert, chronically ill appearing, weak and pale  HENT:     Head: Normocephalic and atraumatic.  Eyes:     Conjunctiva/sclera: Conjunctivae normal.     Pupils: Pupils are equal, round, and reactive to light.  Neck:     Musculoskeletal: Normal range of motion.  Cardiovascular:     Rate and Rhythm: Normal rate and regular rhythm.     Heart sounds: Normal heart sounds.  Pulmonary:     Effort: Pulmonary effort is normal.     Breath sounds: Normal breath sounds.  Chest:     Comments: Port left chest wall, clean without signs of infetion Abdominal:     General: Bowel sounds are normal.     Palpations: Abdomen is soft.    Musculoskeletal: Normal range of motion.      Comments: Well healed midline lumbar incisions, tenderness throughout low back without noted deformity, moving arms and legs well  Skin:    General: Skin is warm and dry.  Neurological:     Mental Status: He is alert and oriented to person, place, and time.      ED Treatments / Results  Labs (all labs ordered are listed, but only abnormal results are displayed) Labs Reviewed  CBC WITH DIFFERENTIAL/PLATELET - Abnormal; Notable for the following components:      Result Value   WBC 46.5 (*)    RBC 2.32 (*)    Hemoglobin 7.7 (*)    HCT 24.2 (*)    MCV 104.3 (*)    RDW 18.7 (*)    Neutro Abs 40.5 (*)    Abs Immature Granulocytes 3.05 (*)    All other components within normal limits  COMPREHENSIVE METABOLIC PANEL - Abnormal; Notable for the following components:   Sodium 132 (*)    CO2 20 (*)    Glucose, Bld 172 (*)    Calcium 7.7 (*)    Total Protein 5.9 (*)    Albumin 2.3 (*)    AST 44 (*)    Alkaline Phosphatase 206 (*)    All other components within normal limits  LACTIC ACID, PLASMA - Abnormal; Notable for the following components:   Lactic Acid, Venous 3.6 (*)    All other components  within normal limits  PROTIME-INR - Abnormal; Notable for the following components:   Prothrombin Time 16.3 (*)    INR 1.3 (*)    All other components within normal limits  SARS CORONAVIRUS 2 (HOSPITAL ORDER, PERFORMED IN [facility])  URINE CULTURE  CULTURE, BLOOD (ROUTINE X 2)  CULTURE, BLOOD (ROUTINE X 2)  TROPONIN I  URINALYSIS, ROUTINE W REFLEX MICROSCOPIC  LACTIC ACID, PLASMA  LACTIC ACID, PLASMA  VITAMIN B12  FOLATE  IRON AND TIBC  FERRITIN  RETICULOCYTES  TYPE AND SCREEN    EKG EKG Interpretation  Date/Time:  Tuesday May 12 2018 02:03:01 EDT Ventricular Rate:  119 PR Interval:    QRS Duration: 99 QT Interval:  331 QTC Calculation: 466 R Axis:   98 Text Interpretation:  Sinus tachycardia Right axis deviation Confirmed by Thayer Jew [phone]) on  05/12/2018 2:07:33 AM   Radiology Dg Chest 1 View  Result Date: 05/12/2018 CLINICAL DATA:  Fall, syncope, hypotension EXAM: CHEST  1 VIEW COMPARISON:  02/24/2018.  Chest CT 02/19/2018 FINDINGS: Right Port-A-Cath in place with the tip in the right atrium. Low lung volumes. Heart is normal size. Previously seen left lower lobe/basilar lung metastasis not as well visualized as on prior CT. No acute airspace consolidation or effusion. No acute bony abnormality. IMPRESSION: Previously seen left basilar metastasis better seen on prior CT. No new acute cardiopulmonary process. Electronically Signed   By: Rolm Baptise M.D.   On: 05/12/2018 02:43   Dg Lumbar Spine Complete  Result Date: 05/12/2018 CLINICAL DATA:  Hypotension.  Syncope, fall EXAM: LUMBAR SPINE - COMPLETE 4+ VIEW COMPARISON:  None. FINDINGS: Lumbar straightening. Diffuse advanced degenerative disc and facet disease. 7 mm of retrolisthesis of L3 on L4. 11 mm retrolisthesis of L4 on L5. 6 mm anterolisthesis of L5 on S1. No fracture. SI joints symmetric and unremarkable. IMPRESSION: Degenerative disc and facet disease as above with associated multilevel listhesis. No acute bony abnormality. Electronically Signed   By: Rolm Baptise M.D.   On: 05/12/2018 02:44   Ct Head Wo Contrast  Result Date: 05/12/2018 CLINICAL DATA:  Headache, hypotension, fall, syncope EXAM: CT HEAD WITHOUT CONTRAST CT CERVICAL SPINE WITHOUT CONTRAST TECHNIQUE: Multidetector CT imaging of the head and cervical spine was performed following the standard protocol without intravenous contrast. Multiplanar CT image reconstructions of the cervical spine were also generated. COMPARISON:  None. FINDINGS: CT HEAD FINDINGS Brain: No acute intracranial abnormality. Specifically, no hemorrhage, hydrocephalus, mass lesion, acute infarction, or significant intracranial injury. Vascular: No hyperdense vessel or unexpected calcification. Skull: No acute calvarial abnormality. Sinuses/Orbits:  Visualized paranasal sinuses and mastoids clear. Orbital soft tissues unremarkable. Other: None CT CERVICAL SPINE FINDINGS Alignment: No subluxation Skull base and vertebrae: No acute fracture. No primary bone lesion or focal pathologic process. Soft tissues and spinal canal: No prevertebral fluid or swelling. No visible canal hematoma. Disc levels: Prior anterior fusion from C5-T1. Degenerative disc disease at C4-5 with anterior spurring. Mild diffuse degenerative facet disease. Upper chest: No acute findings Other: Carotid artery calcifications. IMPRESSION: No acute intracranial abnormality. Postoperative changes and degenerative changes in the cervical spine. No acute bony abnormality. Electronically Signed   By: Rolm Baptise M.D.   On: 05/12/2018 03:01   Ct Cervical Spine Wo Contrast  Result Date: 05/12/2018 CLINICAL DATA:  Headache, hypotension, fall, syncope EXAM: CT HEAD WITHOUT CONTRAST CT CERVICAL SPINE WITHOUT CONTRAST TECHNIQUE: Multidetector CT imaging of the head and cervical spine was performed following the standard protocol without intravenous  contrast. Multiplanar CT image reconstructions of the cervical spine were also generated. COMPARISON:  None. FINDINGS: CT HEAD FINDINGS Brain: No acute intracranial abnormality. Specifically, no hemorrhage, hydrocephalus, mass lesion, acute infarction, or significant intracranial injury. Vascular: No hyperdense vessel or unexpected calcification. Skull: No acute calvarial abnormality. Sinuses/Orbits: Visualized paranasal sinuses and mastoids clear. Orbital soft tissues unremarkable. Other: None CT CERVICAL SPINE FINDINGS Alignment: No subluxation Skull base and vertebrae: No acute fracture. No primary bone lesion or focal pathologic process. Soft tissues and spinal canal: No prevertebral fluid or swelling. No visible canal hematoma. Disc levels: Prior anterior fusion from C5-T1. Degenerative disc disease at C4-5 with anterior spurring. Mild diffuse  degenerative facet disease. Upper chest: No acute findings Other: Carotid artery calcifications. IMPRESSION: No acute intracranial abnormality. Postoperative changes and degenerative changes in the cervical spine. No acute bony abnormality. Electronically Signed   By: Rolm Baptise M.D.   On: 05/12/2018 03:01    Procedures Procedures (including critical care time)  CRITICAL CARE Performed by: Larene Pickett   Total critical care time: 45 minutes  Critical care time was exclusive of separately billable procedures and treating other patients.  Critical care was necessary to treat or prevent imminent or life-threatening deterioration.  Critical care was time spent personally by me on the following activities: development of treatment plan with patient and/or surrogate as well as nursing, discussions with consultants, evaluation of patient's response to treatment, examination of patient, obtaining history from patient or surrogate, ordering and performing treatments and interventions, ordering and review of laboratory studies, ordering and review of radiographic studies, pulse oximetry and re-evaluation of patient's condition.   Medications Ordered in ED Medications  lidocaine (LIDODERM) 5 % 1 patch (1 patch Transdermal Patch Applied 05/12/18 0401)  metroNIDAZOLE (FLAGYL) IVPB 500 mg (500 mg Intravenous New Bag/Given 05/12/18 0429)  vancomycin (VANCOCIN) 1,500 mg in sodium chloride 0.9 % 500 mL IVPB (has no administration in time range)  meropenem (MERREM) 1 g in sodium chloride 0.9 % 100 mL IVPB (has no administration in time range)  pantoprazole (PROTONIX) EC tablet 40 mg (has no administration in time range)  sodium chloride flush (NS) 0.9 % injection 3 mL (has no administration in time range)  sodium chloride flush (NS) 0.9 % injection 3 mL (has no administration in time range)  sodium chloride flush (NS) 0.9 % injection 3 mL (has no administration in time range)  0.9 %  sodium chloride  infusion (has no administration in time range)  acetaminophen (TYLENOL) tablet 650 mg (has no administration in time range)    Or  acetaminophen (TYLENOL) suppository 650 mg (has no administration in time range)  polyethylene glycol (MIRALAX / GLYCOLAX) packet 17 g (has no administration in time range)  ondansetron (ZOFRAN) tablet 4 mg (has no administration in time range)    Or  ondansetron (ZOFRAN) injection 4 mg (has no administration in time range)  insulin aspart (novoLOG) injection 0-9 Units (has no administration in time range)  insulin aspart (novoLOG) injection 0-5 Units (has no administration in time range)  metroNIDAZOLE (FLAGYL) IVPB 500 mg (has no administration in time range)  sodium chloride 0.9 % bolus 500 mL (has no administration in time range)  0.9 %  sodium chloride infusion (has no administration in time range)  fentaNYL (SUBLIMAZE) injection 25-50 mcg (has no administration in time range)  sodium chloride 0.9 % bolus 1,000 mL (1,000 mLs Intravenous New Bag/Given 05/12/18 0421)  fentaNYL (SUBLIMAZE) injection 50 mcg (50 mcg Intravenous Given 05/12/18  0400)     Initial Impression / Assessment and Plan / ED Course  I have reviewed the triage vital signs and the nursing notes.  Pertinent labs & imaging results that were available during my care of the patient were reviewed by me and considered in my medical decision making (see chart for details).  63 year old male here after syncopal episode at home x2.  Did have head and low back trauma.  He was hypotensive with EMS and remained so even after 1500 cc normal saline.  He attempted to stand at the bedside to urinate here and nearly fell on the floor, was assisted back to the bed.  He states this happens quite often unfortunately due to high output from his biliary drain.  He is routinely given IV fluids at the cancer center along with his chemotherapy.  Here he is afebrile but is chronically ill-appearing, weak, and pale.   Biliary drains with continued output here.  He is not have any obvious signs of trauma on exam but does report pain at the occiput and in the low back.  Neurologic exam is nonfocal.  Patient has been hospitalized multiple times in the past due to sepsis, dehydration, etc.  He does have a slight cough here but denies any sick contacts or known COVID exposures.  Will obtain screening labs including blood and urine cultures as well as COVID screen.  Chest x-ray and films of the lumbar spine pending along with CT head and neck.  Patient given additional liter of fluids to bring him to the 30 cc/kg bolus.   Patient's labs as above-- WBC count 46K, however did recently receive dose of leucovorin.  hgb 7.7-- history of chronic GI bleeding but no active bleeding witnessed at this time.  Lactate 3.6  Patient has received 30cc/kg bolus.  Will initiate broad spectrum abx, has hx of ESBL so pharmacy has recommended vanc, meropenem, and flagyl.  UA with culture pending.  Blood cultures drawn.  Imaging negative for acute traumatic injuries or infectious process.  Patient will require admission.  COVID screen pending.  3:32 AM Tried to call patient's wife x2 on home and both cell numbers listed in chart to give updates, however no answer at any of the numbers.  Patient did not know any additional numbers to try when asked.  COVID screen is negative.  Patient's BP has improved somewhat, now in the low 599'H systolic.  Will admit for ongoing care.  Discussed with Dr. Myna Hidalgo-- he will admit.  Final Clinical Impressions(s) / ED Diagnoses   Final diagnoses:  Syncope and collapse  Other specified hypotension    ED Discharge Orders    None       Larene Pickett, PA-C 05/12/18 0510    Merryl Hacker, MD 05/12/18 [phone]

## 2018-05-12 NOTE — ED Notes (Signed)
Date and time results received: 05/12/18 3:18 AM  (use smartphrase ".now" to insert current time)  Test: Lactic Acid Critical Value: 3.6  Name of Provider Notified: Izell Kaka RN  Orders Received? Or Actions Taken?:

## 2018-05-12 NOTE — Progress Notes (Signed)
Physician notified of pt. Current condition.  Lactic acid 2.1, Pt. Tachycardic HR 121, SBP 80s. Awaiting response at this time.

## 2018-05-12 NOTE — ED Notes (Addendum)
Pt attempted to stand at bedside with x2 assist to use urinal but pt became very weak and dizzy. Pt back in bed with RN at bedside.

## 2018-05-12 NOTE — ED Triage Notes (Signed)
Pt from home per EMS had fall after possible syncopy while attempting to ambulate from bed. Pt c/o low back pain after fall. Pt hypotensive on scene and remains hypotensive and tachcardiac on arrival . Zofran 4mg  IV and 1500 ml 0.9Nss infused. 90/57

## 2018-05-13 ENCOUNTER — Other Ambulatory Visit: Payer: Self-pay

## 2018-05-13 ENCOUNTER — Ambulatory Visit: Payer: Medicare Other

## 2018-05-13 ENCOUNTER — Other Ambulatory Visit: Payer: Medicare Other

## 2018-05-13 DIAGNOSIS — K209 Esophagitis, unspecified: Secondary | ICD-10-CM

## 2018-05-13 DIAGNOSIS — C7970 Secondary malignant neoplasm of unspecified adrenal gland: Secondary | ICD-10-CM

## 2018-05-13 DIAGNOSIS — K831 Obstruction of bile duct: Secondary | ICD-10-CM

## 2018-05-13 DIAGNOSIS — D62 Acute posthemorrhagic anemia: Secondary | ICD-10-CM

## 2018-05-13 DIAGNOSIS — I9589 Other hypotension: Secondary | ICD-10-CM

## 2018-05-13 DIAGNOSIS — R571 Hypovolemic shock: Secondary | ICD-10-CM

## 2018-05-13 DIAGNOSIS — I8511 Secondary esophageal varices with bleeding: Secondary | ICD-10-CM

## 2018-05-13 DIAGNOSIS — K922 Gastrointestinal hemorrhage, unspecified: Secondary | ICD-10-CM

## 2018-05-13 DIAGNOSIS — C801 Malignant (primary) neoplasm, unspecified: Secondary | ICD-10-CM

## 2018-05-13 DIAGNOSIS — I8501 Esophageal varices with bleeding: Secondary | ICD-10-CM

## 2018-05-13 DIAGNOSIS — K2970 Gastritis, unspecified, without bleeding: Secondary | ICD-10-CM

## 2018-05-13 LAB — HEMOGLOBIN AND HEMATOCRIT, BLOOD
HCT: 26.6 % — ABNORMAL LOW (ref 39.0–52.0)
HCT: 23.5 % — ABNORMAL LOW (ref 39.0–52.0)
HCT: 23.9 % — ABNORMAL LOW (ref 39.0–52.0)
HCT: 22.9 % — ABNORMAL LOW (ref 39.0–52.0)
HCT: 23.9 % — ABNORMAL LOW (ref 39.0–52.0)
HCT: 18.8 % — ABNORMAL LOW (ref 39.0–52.0)
Hemoglobin: 9 g/dL — ABNORMAL LOW (ref 13.0–17.0)
Hemoglobin: 7.9 g/dL — ABNORMAL LOW (ref 13.0–17.0)
Hemoglobin: 8.1 g/dL — ABNORMAL LOW (ref 13.0–17.0)
Hemoglobin: 7.6 g/dL — ABNORMAL LOW (ref 13.0–17.0)
Hemoglobin: 8 g/dL — ABNORMAL LOW (ref 13.0–17.0)
Hemoglobin: 6.8 g/dL — CL (ref 13.0–17.0)

## 2018-05-13 LAB — GLUCOSE, CAPILLARY
Glucose-Capillary: 184 mg/dL — ABNORMAL HIGH (ref 70–99)
Glucose-Capillary: 215 mg/dL — ABNORMAL HIGH (ref 70–99)
Glucose-Capillary: 256 mg/dL — ABNORMAL HIGH (ref 70–99)
Glucose-Capillary: 237 mg/dL — ABNORMAL HIGH (ref 70–99)
Glucose-Capillary: 341 mg/dL — ABNORMAL HIGH (ref 70–99)
Glucose-Capillary: 298 mg/dL — ABNORMAL HIGH (ref 70–99)

## 2018-05-13 LAB — URINE CULTURE: Culture: NO GROWTH

## 2018-05-13 LAB — CBC WITH DIFFERENTIAL/PLATELET
Abs Immature Granulocytes: 1.79 10*3/uL — ABNORMAL HIGH (ref 0.00–0.07)
Basophils Absolute: 0.1 10*3/uL (ref 0.0–0.1)
Basophils Relative: 0 %
Eosinophils Absolute: 0.1 10*3/uL (ref 0.0–0.5)
Eosinophils Relative: 0 %
HCT: 25.9 % — ABNORMAL LOW (ref 39.0–52.0)
Hemoglobin: 8.7 g/dL — ABNORMAL LOW (ref 13.0–17.0)
Immature Granulocytes: 3 %
Lymphocytes Relative: 2 %
Lymphs Abs: 1.3 10*3/uL (ref 0.7–4.0)
MCH: 31.2 pg (ref 26.0–34.0)
MCHC: 33.6 g/dL (ref 30.0–36.0)
MCV: 92.8 fL (ref 80.0–100.0)
Monocytes Absolute: 2 10*3/uL — ABNORMAL HIGH (ref 0.1–1.0)
Monocytes Relative: 3 %
Neutro Abs: 63.7 10*3/uL — ABNORMAL HIGH (ref 1.7–7.7)
Neutrophils Relative %: 92 %
Platelets: 173 10*3/uL (ref 150–400)
RBC: 2.79 MIL/uL — ABNORMAL LOW (ref 4.22–5.81)
RDW: 15.9 % — ABNORMAL HIGH (ref 11.5–15.5)
WBC: 68.9 10*3/uL (ref 4.0–10.5)
nRBC: 0 % (ref 0.0–0.2)

## 2018-05-13 LAB — COMPREHENSIVE METABOLIC PANEL
ALT: 29 U/L (ref 0–44)
AST: 30 U/L (ref 15–41)
Albumin: 2.2 g/dL — ABNORMAL LOW (ref 3.5–5.0)
Alkaline Phosphatase: 148 U/L — ABNORMAL HIGH (ref 38–126)
Anion gap: 8 (ref 5–15)
BUN: 41 mg/dL — ABNORMAL HIGH (ref 8–23)
CO2: 17 mmol/L — ABNORMAL LOW (ref 22–32)
Calcium: 7.3 mg/dL — ABNORMAL LOW (ref 8.9–10.3)
Chloride: 112 mmol/L — ABNORMAL HIGH (ref 98–111)
Creatinine, Ser: 0.69 mg/dL (ref 0.61–1.24)
GFR calc Af Amer: >60 mL/min (ref >60)
GFR calc non Af Amer: >60 mL/min (ref >60)
Glucose, Bld: 240 mg/dL — ABNORMAL HIGH (ref 70–99)
Potassium: 5.5 mmol/L — ABNORMAL HIGH (ref 3.5–5.1)
Sodium: 137 mmol/L (ref 135–145)
Total Bilirubin: 4.6 mg/dL — ABNORMAL HIGH (ref 0.3–1.2)
Total Protein: 4.4 g/dL — ABNORMAL LOW (ref 6.5–8.1)

## 2018-05-13 LAB — MAGNESIUM: Magnesium: 1.6 mg/dL — ABNORMAL LOW (ref 1.7–2.4)

## 2018-05-13 LAB — LACTIC ACID, PLASMA: Lactic Acid, Venous: 2.8 mmol/L (ref 0.5–1.9)

## 2018-05-13 LAB — PREPARE RBC (CROSSMATCH)

## 2018-05-13 MED ORDER — GABAPENTIN 400 MG PO CAPS: 400.0000 mg | ORAL_CAPSULE | ORAL | Status: DC

## 2018-05-13 MED ORDER — GABAPENTIN 400 MG PO CAPS
800.0000 mg | ORAL_CAPSULE | Freq: Every day | ORAL | Status: DC
  Administered 2018-05-13 – 2018-05-16 (×4): 800 mg via ORAL
  Filled 2018-05-13 (×5): qty 2

## 2018-05-13 MED ORDER — SODIUM CHLORIDE 0.9% IV SOLUTION
Freq: Once | INTRAVENOUS | Status: AC
  Administered 2018-05-14: 01:00:00 via INTRAVENOUS

## 2018-05-13 MED ORDER — TAMSULOSIN HCL 0.4 MG PO CAPS
0.4000 mg | ORAL_CAPSULE | Freq: Every day | ORAL | Status: DC
  Administered 2018-05-13 – 2018-05-15 (×3): 0.4 mg via ORAL
  Filled 2018-05-13 (×3): qty 1

## 2018-05-13 MED ORDER — GABAPENTIN 400 MG PO CAPS
400.0000 mg | ORAL_CAPSULE | Freq: Every day | ORAL | Status: DC
  Administered 2018-05-13 – 2018-05-17 (×5): 400 mg via ORAL
  Filled 2018-05-13 (×4): qty 1

## 2018-05-13 MED ORDER — VITAMIN K1 10 MG/ML IJ SOLN
5.0000 mg | Freq: Once | INTRAMUSCULAR | Status: AC
  Administered 2018-05-13: 5 mg via SUBCUTANEOUS
  Filled 2018-05-13: qty 0.5

## 2018-05-13 MED ORDER — ENSURE ENLIVE PO LIQD
237.0000 mL | Freq: Two times a day (BID) | ORAL | Status: DC
  Administered 2018-05-13 – 2018-05-17 (×5): 237 mL via ORAL

## 2018-05-13 MED ORDER — VASOPRESSIN 20 UNIT/ML IV SOLN
0.0300 [IU]/min | INTRAVENOUS | Status: DC
  Administered 2018-05-13 – 2018-05-14 (×3): 0.03 [IU]/min via INTRAVENOUS
  Filled 2018-05-13 (×3): qty 2

## 2018-05-13 MED ORDER — MIDAZOLAM HCL (PF) 10 MG/2ML IJ SOLN
INTRAMUSCULAR | Status: DC | PRN
  Administered 2018-05-13 (×3): 1 mg via INTRAVENOUS

## 2018-05-13 MED ORDER — LORATADINE 10 MG PO TABS
10.0000 mg | ORAL_TABLET | Freq: Every day | ORAL | Status: DC
  Administered 2018-05-13 – 2018-05-17 (×5): 10 mg via ORAL
  Filled 2018-05-13 (×5): qty 1

## 2018-05-13 MED ORDER — ADULT MULTIVITAMIN W/MINERALS CH
1.0000 | ORAL_TABLET | Freq: Every day | ORAL | Status: DC
  Administered 2018-05-13 – 2018-05-17 (×5): 1 via ORAL
  Filled 2018-05-13 (×5): qty 1

## 2018-05-13 MED ORDER — HYDROCORTISONE NA SUCCINATE PF 100 MG IJ SOLR
50.0000 mg | Freq: Four times a day (QID) | INTRAMUSCULAR | Status: DC
  Administered 2018-05-13 – 2018-05-16 (×13): 50 mg via INTRAVENOUS
  Filled 2018-05-13 (×13): qty 2

## 2018-05-13 MED ORDER — BOOST / RESOURCE BREEZE PO LIQD CUSTOM
1.0000 | Freq: Two times a day (BID) | ORAL | Status: DC
  Administered 2018-05-13: 1 via ORAL

## 2018-05-13 MED ORDER — MAGNESIUM SULFATE IN D5W 1-5 GM/100ML-% IV SOLN
1.0000 g | Freq: Once | INTRAVENOUS | Status: AC
  Administered 2018-05-13: 1 g via INTRAVENOUS
  Filled 2018-05-13: qty 100

## 2018-05-13 MED ORDER — SODIUM CHLORIDE 0.9 % IV SOLN
2.0000 g | INTRAVENOUS | Status: DC
  Administered 2018-05-13 – 2018-05-16 (×4): 2 g via INTRAVENOUS
  Filled 2018-05-13 (×2): qty 2
  Filled 2018-05-13: qty 20
  Filled 2018-05-13 (×2): qty 2

## 2018-05-13 MED ORDER — DULOXETINE HCL 30 MG PO CPEP
30.0000 mg | ORAL_CAPSULE | Freq: Two times a day (BID) | ORAL | Status: DC
  Administered 2018-05-13 – 2018-05-17 (×9): 30 mg via ORAL
  Filled 2018-05-13 (×9): qty 1

## 2018-05-13 NOTE — Progress Notes (Signed)
Between 1900 and 2330 pt vomited approx 2000 mL of blood. Critical care medicine along with Dr. Carlean Purl were made aware. See Dr. Celesta Aver note. Pt has much improved since then. There has been no more occurrences of emesis and hemoglobin has went up. Will continue to monitor.

## 2018-05-13 NOTE — Progress Notes (Signed)
CRITICAL VALUE ALERT  Critical Value:  6.8 hemoglobin   Date & Time Notied:  2318  Provider Notified: Yes  Orders Received/Actions taken: MD ordered blood transfusion.

## 2018-05-13 NOTE — Brief Op Note (Addendum)
05/12/2018  12:24 AM  PATIENT:  [name]  63 y.o. male  PRE-OPERATIVE DIAGNOSIS:  Upper gI bleed  POST-OPERATIVE DIAGNOSIS: Upper GI bleed from esophageal varices  PROCEDURE:  Procedure(s): ESOPHAGOGASTRODUODENOSCOPY (EGD) (N/A)  SURGEON:  Surgeon(s) and Role:    * Gatha Mayer, MD - Primary    ANESTHESIA: Fentanyl 25 ug and Versed 3 mg IV  EBL: Minimal from procedure but vomited several hundred cc red blood present in stomach   Findings:  Large distal esophageal varices - + nipple signs Large amount of blood in stomach - minimal exam due to minimal sedation effect and wretching Duodenum not entered  Given high suspicion of variceal bleeding I placed 4 variceal ligation bands in the distal esophagus with decompression effect and no other varices to band   Plan:  Stay on Octreotide and PPi infusion Support with blood and pressors prn Will f/u later today  He could need another EGD within 24 hrs depending upon clinical course as exam not complete and I can not be completely confident that bleeding from varices though clinical scenario very consistent with that  Wife updated re: results of procedure by phone

## 2018-05-13 NOTE — Progress Notes (Signed)
Matthew Lewis   DOB:[birth year]   [id number]   LAG#:[id number]  Oncology f/u note   Subjective: Pt had severe upper GI bleeding from varices last night, transfused 5u PRBC, had urgent EGD and varices banding. Feels better today, very fatigued. "I thought I was going to die last night". Denies significant pain.   Objective:  Vitals:   05/13/18 1930 05/13/18 2000  BP: 91/77 124/67  Pulse: (!) 120 (!) 118  Resp: (!) 28 (!) 24  Temp:    SpO2: 100% 99%    Body mass index is 27.52 kg/m.  Intake/Output Summary (Last 24 hours) at 05/13/2018 2116 Last data filed at 05/13/2018 1800 Gross per 24 hour  Intake 5557.69 ml  Output 2426 ml  Net 3131.69 ml     Sclerae unicteric  Oropharynx clear  No peripheral adenopathy  Lungs clear -- no rales or rhonchi  Heart regular rate and rhythm  Abdomen soft, (+) two biliary drain tubes   MSK no focal spinal tenderness, no peripheral edema  Neuro nonfocal    CBG (last 3)  Recent Labs    05/13/18 0745 05/13/18 1133 05/13/18 1602  GLUCAP 256* 237* 341*    Urine Studies No results for input(s): UHGB, CRYS in the last 72 hours.  Invalid input(s): UACOL, UAPR, USPG, UPH, UTP, UGL, UKET, UBIL, UNIT, UROB, ULEU, UEPI, UWBC, Junie Panning Re[place], [place], [place]  Basic Metabolic Panel: Recent Labs  Lab 05/07/18 0840 05/12/18 0201 05/13/18 0303  NA 133* 132* 137  K 3.4* 4.5 5.5*  CL 100 103 112*  CO2 24 20* 17*  GLUCOSE 244* 172* 240*  BUN 13 21 41*  CREATININE 0.77 0.70 0.69  CALCIUM 8.8* 7.7* 7.3*  MG  --   --  1.6*   GFR Estimated Creatinine Clearance: 99.4 mL/min (by C-G formula based on SCr of 0.69 mg/dL). Liver Function Tests: Recent Labs  Lab 05/07/18 0840 05/12/18 0201 05/13/18 0303  AST 40 44* 30  ALT 34 35 29  ALKPHOS 263* 206* 148*  BILITOT 1.0 1.1 4.6*  PROT 7.8 5.9* 4.4*  ALBUMIN 2.8* 2.3* 2.2*   No results for input(s): LIPASE, AMYLASE in the last 168 hours. No results for input(s): AMMONIA in the last 168  hours. Coagulation profile Recent Labs  Lab 05/12/18 0201 05/12/18 2113  INR 1.3* 1.7*    CBC: Recent Labs  Lab 05/07/18 0840 05/12/18 0201  05/13/18 0303 05/13/18 0619 05/13/18 0948 05/13/18 1419 05/13/18 1727  WBC 11.2* 46.5*  --  68.9*  --   --   --   --   NEUTROABS 8.8* 40.5*  --  63.7*  --   --   --   --   HGB 10.1* 7.7*   < > 8.7* 7.9* 8.1* 7.6* 8.0*  HCT 30.7* 24.2*   < > 25.9* 23.5* 23.9* 22.9* 23.9*  MCV 96.8 104.3*  --  92.8  --   --   --   --   PLT 150 181  --  173  --   --   --   --    < > = values in this interval not displayed.   Cardiac Enzymes: Recent Labs  Lab 05/12/18 0201  TROPONINI <0.03   BNP: Invalid input(s): POCBNP CBG: Recent Labs  Lab 05/13/18 0149 05/13/18 0408 05/13/18 0745 05/13/18 1133 05/13/18 1602  GLUCAP 184* 215* 256* 237* 341*   D-Dimer No results for input(s): DDIMER in the last 72 hours. Hgb A1c No results for input(s): HGBA1C  in the last 72 hours. Lipid Profile No results for input(s): CHOL, HDL, LDLCALC, TRIG, CHOLHDL, LDLDIRECT in the last 72 hours. Thyroid function studies No results for input(s): TSH, T4TOTAL, T3FREE, THYROIDAB in the last 72 hours.  Invalid input(s): FREET3 Anemia work up Recent Labs    05/12/18 0746  VITAMINB12 3,030*  FOLATE 32.0  FERRITIN 190  TIBC 267  IRON 97  RETICCTPCT 0.7   Microbiology Recent Results (from the past 240 hour(s))  Blood culture (routine x 2)     Status: None (Preliminary result)   Collection Time: 05/12/18  2:01 AM  Result Value Ref Range Status   Specimen Description   Final    BLOOD BLOOD LEFT FOREARM Performed at [facility], [place] [address], [place], [place] [address]    Special Requests   Final    BOTTLES DRAWN AEROBIC AND ANAEROBIC Blood Culture adequate volume Performed at [place] [address], [place], [place] [address]    Culture   Final    NO GROWTH 1 DAY Performed at [facility], [place] [address] [place] [address] [place], [place] [address]    Report Status PENDING  Incomplete  SARS Coronavirus 2 (CEPHEID - Performed in [place] hospital lab), Hosp Order     Status: None   Collection Time: 05/12/18  2:06 AM  Result Value Ref Range Status   SARS Coronavirus 2 NEGATIVE NEGATIVE Final    Comment: (NOTE) If result is NEGATIVE SARS-CoV-2 target nucleic acids are NOT DETECTED. The SARS-CoV-2 RNA is generally detectable in upper and lower  respiratory specimens during the acute phase of infection. The lowest  concentration of SARS-CoV-2 viral copies this assay can detect is 250  copies / mL. A negative result does not preclude SARS-CoV-2 infection  and should not be used as the sole basis for treatment or other  patient management decisions.  A negative result may occur with  improper specimen collection / handling, submission of specimen other  than nasopharyngeal swab, presence of viral mutation(s) within the  areas targeted by this assay, and inadequate number of viral copies  (<250 copies / mL). A negative result must be combined with clinical  observations, patient history, and epidemiological information. If result is POSITIVE SARS-CoV-2 target nucleic acids are DETECTED. The SARS-CoV-2 RNA is generally detectable in upper and lower  respiratory specimens dur ing the acute phase of infection.  Positive  results are indicative of active infection with SARS-CoV-2.  Clinical  correlation with patient history and other diagnostic information is  necessary to determine patient infection status.  Positive results do  not rule out bacterial infection or co-infection with other viruses. If result is PRESUMPTIVE POSTIVE SARS-CoV-2 nucleic acids MAY BE PRESENT.   A presumptive positive result was obtained on the submitted specimen  and confirmed on repeat testing.  While 2019 novel coronavirus  (SARS-CoV-2) nucleic acids may be present in the submitted sample  additional confirmatory testing  may be necessary for epidemiological  and / or clinical management purposes  to differentiate between  SARS-CoV-2 and other Sarbecovirus currently known to infect humans.  If clinically indicated additional testing with an alternate test  methodology [phone]) is advised. The SARS-CoV-2 RNA is generally  detectable in upper and lower respiratory sp ecimens during the acute  phase of infection. The expected result is Negative. Fact Sheet for Patients:  StrictlyIdeas.no Fact Sheet for Healthcare Providers: BankingDealers.co.za This test is not yet approved or cleared by the [place]  FDA and has been authorized for detection and/or diagnosis of SARS-CoV-2 by FDA under an Emergency Use Authorization (EUA).  This EUA will remain in effect (meaning this test can be used) for the duration of the COVID-19 declaration under Section 564(b)(1) of the Act, 21 U.S.C. section 360bbb-3(b)(1), unless the authorization is terminated or revoked sooner. Performed at [facility], [place] [address], [place], [place] [address]   Blood culture (routine x 2)     Status: None (Preliminary result)   Collection Time: 05/12/18  2:54 AM  Result Value Ref Range Status   Specimen Description   Final    BLOOD RIGHT HAND Performed at [facility] [address], [place], [place] [address]    Special Requests   Final    BOTTLES DRAWN AEROBIC AND ANAEROBIC Blood Culture adequate volume Performed at [facility] [address] [place], [place] [address]    Culture   Final    NO GROWTH 1 DAY Performed at [facility], [place] [address], [place], [place] [address]    Report Status PENDING  Incomplete  Urine culture     Status: None   Collection Time: 05/12/18  6:49 AM  Result Value Ref Range Status   Specimen Description   Final    URINE, RANDOM Performed at [facility]  [address], [place], [place] [address]    Special Requests   Final    NONE Performed at [facility], [place] [address], [place], [place] [address]    Culture   Final    NO GROWTH Performed at [facility], [place] [address], [place],  [address]    Report Status 05/13/2018 FINAL  Final  MRSA PCR Screening     Status: None   Collection Time: 05/12/18  7:38 AM  Result Value Ref Range Status   MRSA by PCR NEGATIVE NEGATIVE Final    Comment:        The GeneXpert MRSA Assay (FDA approved for NASAL specimens only), is one component of a comprehensive MRSA colonization surveillance program. It is not intended to diagnose MRSA infection nor to guide or monitor treatment for MRSA infections. Performed at [facility], [place] [address] [place],  [address]       Studies:  Dg Chest 1 View  Result Date: 05/12/2018 CLINICAL DATA:  Fall, syncope, hypotension EXAM: CHEST  1 VIEW COMPARISON:  02/24/2018.  Chest CT 02/19/2018 FINDINGS: Right Port-A-Cath in place with the tip in the right atrium. Low lung volumes. Heart is normal size. Previously seen left lower lobe/basilar lung metastasis not as well visualized as on prior CT. No acute airspace consolidation or effusion. No acute bony abnormality. IMPRESSION: Previously seen left basilar metastasis better seen on prior CT. No new acute cardiopulmonary process. Electronically Signed   By: Rolm Baptise M.D.   On: 05/12/2018 02:43   Dg Lumbar Spine Complete  Result Date: 05/12/2018 CLINICAL DATA:  Hypotension.  Syncope, fall EXAM: LUMBAR SPINE - COMPLETE 4+ VIEW COMPARISON:  None. FINDINGS: Lumbar straightening. Diffuse advanced degenerative disc and facet disease. 7 mm of retrolisthesis of L3 on L4. 11 mm retrolisthesis of L4 on L5. 6 mm anterolisthesis of L5 on S1. No fracture. SI joints symmetric and unremarkable. IMPRESSION: Degenerative disc and facet disease as above with associated multilevel  listhesis. No acute bony abnormality. Electronically Signed   By: Rolm Baptise M.D.   On: 05/12/2018 02:44  Ct Head Wo Contrast  Result Date: 05/12/2018 CLINICAL DATA:  Headache, hypotension, fall, syncope EXAM: CT HEAD WITHOUT CONTRAST CT CERVICAL SPINE WITHOUT CONTRAST TECHNIQUE: Multidetector CT imaging of the head and cervical spine was performed following the standard protocol without intravenous contrast. Multiplanar CT image reconstructions of the cervical spine were also generated. COMPARISON:  None. FINDINGS: CT HEAD FINDINGS Brain: No acute intracranial abnormality. Specifically, no hemorrhage, hydrocephalus, mass lesion, acute infarction, or significant intracranial injury. Vascular: No hyperdense vessel or unexpected calcification. Skull: No acute calvarial abnormality. Sinuses/Orbits: Visualized paranasal sinuses and mastoids clear. Orbital soft tissues unremarkable. Other: None CT CERVICAL SPINE FINDINGS Alignment: No subluxation Skull base and vertebrae: No acute fracture. No primary bone lesion or focal pathologic process. Soft tissues and spinal canal: No prevertebral fluid or swelling. No visible canal hematoma. Disc levels: Prior anterior fusion from C5-T1. Degenerative disc disease at C4-5 with anterior spurring. Mild diffuse degenerative facet disease. Upper chest: No acute findings Other: Carotid artery calcifications. IMPRESSION: No acute intracranial abnormality. Postoperative changes and degenerative changes in the cervical spine. No acute bony abnormality. Electronically Signed   By: Rolm Baptise M.D.   On: 05/12/2018 03:01   Ct Cervical Spine Wo Contrast  Result Date: 05/12/2018 CLINICAL DATA:  Headache, hypotension, fall, syncope EXAM: CT HEAD WITHOUT CONTRAST CT CERVICAL SPINE WITHOUT CONTRAST TECHNIQUE: Multidetector CT imaging of the head and cervical spine was performed following the standard protocol without intravenous contrast. Multiplanar CT image reconstructions of the  cervical spine were also generated. COMPARISON:  None. FINDINGS: CT HEAD FINDINGS Brain: No acute intracranial abnormality. Specifically, no hemorrhage, hydrocephalus, mass lesion, acute infarction, or significant intracranial injury. Vascular: No hyperdense vessel or unexpected calcification. Skull: No acute calvarial abnormality. Sinuses/Orbits: Visualized paranasal sinuses and mastoids clear. Orbital soft tissues unremarkable. Other: None CT CERVICAL SPINE FINDINGS Alignment: No subluxation Skull base and vertebrae: No acute fracture. No primary bone lesion or focal pathologic process. Soft tissues and spinal canal: No prevertebral fluid or swelling. No visible canal hematoma. Disc levels: Prior anterior fusion from C5-T1. Degenerative disc disease at C4-5 with anterior spurring. Mild diffuse degenerative facet disease. Upper chest: No acute findings Other: Carotid artery calcifications. IMPRESSION: No acute intracranial abnormality. Postoperative changes and degenerative changes in the cervical spine. No acute bony abnormality. Electronically Signed   By: Rolm Baptise M.D.   On: 05/12/2018 03:01    Assessment: 63 y.o. with metastatic cholangiocarcinoma, on chemotherapy, admitted for hypotension  1.  Severe upper GI bleeding from varices, status post banding and 5u blood  2. Hypotension and syncope, secondary to dehydration  3.  Metastatic cholangiocarcinoma, on second line chemo 4. Liver cirrhosis 5. Anemia of blood loss, malignancy and chemo  6.  Obstructive jaundice, with bilary drains 7. Leukocytosis secondary to GCSF and reaction, ? Infection     Plan:  -appreciate GI team -close monitoring CBC  -he is DNR, but want to continue medical care  -chemo likely will be held for a while, not sure if he can restart. Pt is realistic, will like agree hospice if he continues to deteriorate   -continue supportive care  -I will f/u    [name], MD 05/13/2018

## 2018-05-13 NOTE — Progress Notes (Addendum)
[facility] Progress Note Patient Name: Matthew Lewis DOB: [birth year] MRN: [id number]   Date of Service  05/13/2018  HPI/Events of Note  Had a bloody bowel movement earlier this evening and hemoglobin now down to 6.8 gm %  eICU Interventions  Transfuse 2 units PRBC. Also check coagulation parameters.        [name] 05/13/2018, 11:24 PM

## 2018-05-13 NOTE — Progress Notes (Signed)
CRITICAL VALUE ALERT  Critical Value:  LA 2.8  Date & Time Notied:  05/13/18 0900  Provider Notified: Ander Slade  Orders Received/Actions taken: No new orders at this time.

## 2018-05-13 NOTE — Progress Notes (Addendum)
Patient ID: Matthew Lewis, male   DOB: [birth year], 63 y.o.   MRN: [id number]     Progress Note   Subjective   Feels ok - "better"- no further stools this am, no emesis, no c/o pain  HGB 7.9 EGD last pm - large varices - banded x 4, no visible  Active bleeding from varices, lots of blood in stomach  Remains on pressors, octreotide, PPI   Objective   Vital signs in last 24 hours: Temp:  [97.4 F (36.3 C)-98.7 F (37.1 C)] 98.7 F (37.1 C) (05/06 0400) Pulse Rate:  [89-134] 124 (05/06 0615) Resp:  [14-25] 20 (05/06 0615) BP: (52-162)/(31-137) 103/64 (05/06 0615) SpO2:  [72 %-100 %] 97 % (05/06 0615) Weight:  [89.5 kg] 89.5 kg (05/06 0500)   General:    white male in NAD, pale  Heart: tachy Regular rate and rhythm; no murmurs Lungs: Respirations even and unlabored, lungs CTA bilaterally Abdomen:  Soft, nontender and nondistended. Normal bowel sounds. Extremities:  Without edema.-biliary drains on both LE Neurologic:  Alert and oriented,  grossly normal neurologically. Psych:  Cooperative. Normal mood and affect.  Intake/Output from previous day: 05/05 0701 - 05/06 0700 In: 4440.8 [I.V.:2427.9; Blood:1575; IV Piggyback:437.8] Out: 2951 [Urine:2000; Drains:950; Stool:1] Intake/Output this shift: No intake/output data recorded.  Lab Results: Recent Labs    05/12/18 0201  05/13/18 0137 05/13/18 0303 05/13/18 0619  WBC 46.5*  --   --  68.9*  --   HGB 7.7*   < > 9.0* 8.7* 7.9*  HCT 24.2*   < > 26.6* 25.9* 23.5*  PLT 181  --   --  173  --    < > = values in this interval not displayed.   BMET Recent Labs    05/12/18 0201 05/13/18 0303  NA 132* 137  K 4.5 5.5*  CL 103 112*  CO2 20* 17*  GLUCOSE 172* 240*  BUN 21 41*  CREATININE 0.70 0.69  CALCIUM 7.7* 7.3*   LFT Recent Labs    05/13/18 0303  PROT 4.4*  ALBUMIN 2.2*  AST 30  ALT 29  ALKPHOS 148*  BILITOT 4.6*   PT/INR Recent Labs    05/12/18 0201 05/12/18 2113  LABPROT 16.3* 19.7*  INR 1.3*  1.7*    Studies/Results: Dg Chest 1 View  Result Date: 05/12/2018 CLINICAL DATA:  Fall, syncope, hypotension EXAM: CHEST  1 VIEW COMPARISON:  02/24/2018.  Chest CT 02/19/2018 FINDINGS: Right Port-A-Cath in place with the tip in the right atrium. Low lung volumes. Heart is normal size. Previously seen left lower lobe/basilar lung metastasis not as well visualized as on prior CT. No acute airspace consolidation or effusion. No acute bony abnormality. IMPRESSION: Previously seen left basilar metastasis better seen on prior CT. No new acute cardiopulmonary process. Electronically Signed   By: Rolm Baptise M.D.   On: 05/12/2018 02:43   Dg Lumbar Spine Complete  Result Date: 05/12/2018 CLINICAL DATA:  Hypotension.  Syncope, fall EXAM: LUMBAR SPINE - COMPLETE 4+ VIEW COMPARISON:  None. FINDINGS: Lumbar straightening. Diffuse advanced degenerative disc and facet disease. 7 mm of retrolisthesis of L3 on L4. 11 mm retrolisthesis of L4 on L5. 6 mm anterolisthesis of L5 on S1. No fracture. SI joints symmetric and unremarkable. IMPRESSION: Degenerative disc and facet disease as above with associated multilevel listhesis. No acute bony abnormality. Electronically Signed   By: Rolm Baptise M.D.   On: 05/12/2018 02:44   Ct Head Wo Contrast  Result Date: 05/12/2018 CLINICAL  DATA:  Headache, hypotension, fall, syncope EXAM: CT HEAD WITHOUT CONTRAST CT CERVICAL SPINE WITHOUT CONTRAST TECHNIQUE: Multidetector CT imaging of the head and cervical spine was performed following the standard protocol without intravenous contrast. Multiplanar CT image reconstructions of the cervical spine were also generated. COMPARISON:  None. FINDINGS: CT HEAD FINDINGS Brain: No acute intracranial abnormality. Specifically, no hemorrhage, hydrocephalus, mass lesion, acute infarction, or significant intracranial injury. Vascular: No hyperdense vessel or unexpected calcification. Skull: No acute calvarial abnormality. Sinuses/Orbits: Visualized  paranasal sinuses and mastoids clear. Orbital soft tissues unremarkable. Other: None CT CERVICAL SPINE FINDINGS Alignment: No subluxation Skull base and vertebrae: No acute fracture. No primary bone lesion or focal pathologic process. Soft tissues and spinal canal: No prevertebral fluid or swelling. No visible canal hematoma. Disc levels: Prior anterior fusion from C5-T1. Degenerative disc disease at C4-5 with anterior spurring. Mild diffuse degenerative facet disease. Upper chest: No acute findings Other: Carotid artery calcifications. IMPRESSION: No acute intracranial abnormality. Postoperative changes and degenerative changes in the cervical spine. No acute bony abnormality. Electronically Signed   By: Rolm Baptise M.D.   On: 05/12/2018 03:01   Ct Cervical Spine Wo Contrast  Result Date: 05/12/2018 CLINICAL DATA:  Headache, hypotension, fall, syncope EXAM: CT HEAD WITHOUT CONTRAST CT CERVICAL SPINE WITHOUT CONTRAST TECHNIQUE: Multidetector CT imaging of the head and cervical spine was performed following the standard protocol without intravenous contrast. Multiplanar CT image reconstructions of the cervical spine were also generated. COMPARISON:  None. FINDINGS: CT HEAD FINDINGS Brain: No acute intracranial abnormality. Specifically, no hemorrhage, hydrocephalus, mass lesion, acute infarction, or significant intracranial injury. Vascular: No hyperdense vessel or unexpected calcification. Skull: No acute calvarial abnormality. Sinuses/Orbits: Visualized paranasal sinuses and mastoids clear. Orbital soft tissues unremarkable. Other: None CT CERVICAL SPINE FINDINGS Alignment: No subluxation Skull base and vertebrae: No acute fracture. No primary bone lesion or focal pathologic process. Soft tissues and spinal canal: No prevertebral fluid or swelling. No visible canal hematoma. Disc levels: Prior anterior fusion from C5-T1. Degenerative disc disease at C4-5 with anterior spurring. Mild diffuse degenerative facet  disease. Upper chest: No acute findings Other: Carotid artery calcifications. IMPRESSION: No acute intracranial abnormality. Postoperative changes and degenerative changes in the cervical spine. No acute bony abnormality. Electronically Signed   By: Rolm Baptise M.D.   On: 05/12/2018 03:01      Assessment / Plan:     #1 63 yo WM admitted with hemorrhagic shock, hematemesis-last pm. Pt with metastatic cholangiocarcinoma, currently undergoing chemo(Folfox). Underlying Cirrhosis. Also with SIRS criteria - possible sepsis  Bleeding presumed secondary to large esophageal varices though no active bleeding seen - banded x 4 - large amount of blood in stomach obscuring view - cannot r/o gastric varices  Stable since procedure though remains on pressors  #2 anemia - secondary to acute bleed - stable after 3 units PRBCs #3 PVT #4 hx of Bleed 02/2018- found 6 mm duodenal ulcer - and portal gastropathy #5 DNR  Recommendations:  Clear liquid diet today  Continue Octreotide x 72 hours Hopefully can wean pressors - per CCM Continue empiric antibiotics, await cultures IV PPI infusion  Continue serial hgb's - transfuse to keep hgb 7 Will schedule for repeat /relook EGD in am tomorrow ( pt strongly requesting better sedation /propofol)    Active Problems:   Type II diabetes mellitus ([place])   Hyponatremia   Biliary obstruction due to malignant neoplasm ([facility])   Malignant 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 I agree with the Advanced Practitioner's note, impression and recommendations.   Acute UGI  bleed with hypotension in setting of decompensated alcoholic cirrhosis, PVT, metastatic cholangiocarcinoma, recent DU. Large esophageal varices noted on EGD with red blood throughout the stomach obscuring the mucosa last night. Duodenal ulcer at major ampulla near indwelling biliary stent in February. Pt followed by Dr. Silverio Decamp.   Continue octreotide, pantoprazole and pressors per CCM Antibiotics were stopped today will start Rocephin Vit K Trend CBC, PT/INR Repeat EGD tomorrow with MAC to fully examine stomach and examine duodenum  Lucio Edward, MD [facility] [phone]

## 2018-05-13 NOTE — Progress Notes (Signed)
Initial Nutrition Assessment  DOCUMENTATION CODES:   Non-severe (moderate) malnutrition in context of chronic illness  INTERVENTION:  - will order Boost Breeze BID, each supplement provides 250 kcal and 9 grams of protein. - will order Ensure Enlive BID for once diet advanced to at least FLD, each supplement provides 350 kcal and 20 grams of protein. - will order daily multivitamin with minerals. - continue to encourage PO intakes.    NUTRITION DIAGNOSIS:   Moderate Malnutrition related to chronic illness, catabolic illness, cancer and cancer related treatments as evidenced by percent weight loss, energy intake < or equal to 75% for > or equal to 1 month. GOAL:   Patient will meet greater than or equal to 90% of their needs  MONITOR:   PO intake, Supplement acceptance, Diet advancement, Labs, Weight trends  REASON FOR ASSESSMENT:   Malnutrition Screening Tool  ASSESSMENT:   63 year-old male with metastatic cholangiocarcinoma, biliary obstruction s/p perc drains admitted 5/5 with syncopal episode and hypotension. He developed hematemesis with subsequent ABLA. EGD done 5/5 AM and indicated grade 2 esophageal varices, now s/p banding. Required vasopressors for hypotension.  BMI indicates overweight status. Diet advanced from NPO to CLD following EGD this AM. Patient requested a diet ginger ale at the time of RD visit. Patient reports N/V began on the date of admission (yesterday, 5/5). His last chemo was last Thursday (4/30) and since that time he has been experiencing abdominal pain. Patient reports that pain is much improved following EGD.   He states that over the past few months appetite has been "fair" and that in the past 1 week it has been further decreased d/t abdominal pain. He confirms taste alterations since starting chemo and states that items simply taste different and that this leads to a lack of desire to eat.  Patient was last seen by [facility] RD on 1/31 at which time  patient had been drinking Ensure Max TID. Unable to talk with patient about supplements at this time as he was wanting to rest. He reports he was told he will have repeat EGD tomorrow.   Per chart review, current weight is 197 lb, weight on 4/30 was 183 lb and on 2/27 he weighed 201 lb. This indicates 18 lb weight loss (9% body weight) from 2/27-4/30 and then 14 lb weight gain over the past week; suspect that weight gain may be fluid related vs inaccuracy in scale. Will monitor weight trends closely.      Medications reviewed; 25 g albumin x2 doses 5/5, 50 mg solu-cortef QID, sliding scale novolog, 1 g IV MG sulfate x1 run 5/6, 10 mg IV reglan x1 dose 5/5, protonix @ 25 ml/hr. Labs reviewed; CBGs: 184, 215, and 256 mg/dl today, Mg: 1.6 mg/dl, Alk Phos elevated, K: 5.5 mmol/l, Cl: 112 mmol/l, BUN: 41 mg/dl, Ca: 7.3 mg/dl. IVF; NS @ 125 ml/hr.     NUTRITION - FOCUSED PHYSICAL EXAM:    Most Recent Value  Orbital Region  No depletion  Upper Arm Region  Mild depletion  Thoracic and Lumbar Region  Unable to assess  Buccal Region  Mild depletion  Temple Region  Mild depletion  Clavicle Bone Region  Mild depletion  Clavicle and Acromion Bone Region  Mild depletion  Scapular Bone Region  No depletion  Dorsal Hand  No depletion  Patellar Region  Unable to assess  Anterior Thigh Region  Unable to assess  Posterior Calf Region  Unable to assess  Edema (RD Assessment)  Unable to  assess  Hair  Reviewed  Eyes  Reviewed  Mouth  Reviewed  Skin  Reviewed  Nails  Reviewed       Diet Order:   Diet Order            Diet NPO time specified  Diet effective midnight        Diet clear liquid Room service appropriate? Yes; Fluid consistency: Thin  Diet effective now              EDUCATION NEEDS:   Not appropriate for education at this time  Skin:  Skin Assessment: Reviewed RN Assessment  Last BM:  5/6  Height:   Ht Readings from Last 1 Encounters:  05/07/18 '5\' 11"'  (1.803 m)     Weight:   Wt Readings from Last 1 Encounters:  05/13/18 89.5 kg    Ideal Body Weight:  78.18 kg  BMI:  Body mass index is 27.52 kg/m.  Estimated Nutritional Needs:   Kcal:  5430-1484 kcal  Protein:  125-135 grams  Fluid:  >/= 2.4 L/day     Jarome Matin, MS, RD, LDN, [facility] Inpatient Clinical Dietitian Pager # [phone] After hours/weekend pager # [phone]

## 2018-05-13 NOTE — Op Note (Signed)
[facility] Patient Name: Matthew Lewis Procedure Date: 05/12/2018 MRN: [id number] Attending MD: Gatha Mayer , MD Date of Birth: 19-Apr-1955 CSN: [id number] Age: 63 Admit Type: Inpatient Procedure:                Upper GI endoscopy Indications:              Hematemesis Providers:                Gatha Mayer, MD, Glori Bickers, RN, Burtis Junes,                            RN, Charolette Child, Technician Referring MD:              Medicines:                Fentanyl 25 micrograms IV, Midazolam 3 mg IV Complications:            No immediate complications. Estimated Blood Loss:      Procedure:                Pre-Anesthesia Assessment:                           - Prior to the procedure, a History and Physical                            was performed, and patient medications and                            allergies were reviewed. The patient's tolerance of                            previous anesthesia was also reviewed. The risks                            and benefits of the procedure and the sedation                            options and risks were discussed with the patient.                            All questions were answered, and informed consent                            was obtained. Prior Anticoagulants: The patient has                            taken no previous anticoagulant or antiplatelet                            agents. ASA Grade Assessment: IV - A patient with                            severe systemic disease that is a constant threat  to life. After reviewing the risks and benefits,                            the patient was deemed in satisfactory condition to                            undergo the procedure.                           After obtaining informed consent, the endoscope was                            passed under direct vision. Throughout the                            procedure, the patient's blood pressure,  pulse, and                            oxygen saturations were monitored continuously. The                            GIF-H190 ([id number]) Olympus gastroscope was                            introduced through the mouth, and advanced to the                            body of the stomach. The upper GI endoscopy was                            performed with difficulty due to ineffective                            sedation. The patient tolerated the procedure                            fairly well. Scope In: Scope Out: Findings:      Grade II, large (> 5 mm) varices were found in the lower third of the       esophagus. Four bands were successfully placed with complete       eradication, resulting in deflation of varices. Estimated blood loss was       minimal.      Red blood was found in the entire examined stomach. Impression:               - Grade II and large (> 5 mm) esophageal varices.                            Completely eradicated. Banded. Nipples seen no                            visible bleeding but he was vomiting blood as could  only lightly sedate due to shock. Was not intubated                            as he was DNR and declined intubation. Wife                            updateed by phone pre and post procedure                           - Red blood in the entire stomach.                           - No specimens collected. Moderate Sedation:      Moderate (conscious) sedation was administered by the endoscopy nurse       and supervised by the endoscopist. The following parameters were       monitored: oxygen saturation, heart rate, blood pressure, respiratory       rate, EKG, adequacy of pulmonary ventilation, and response to care.       Total physician intraservice time was 15 minutes. Recommendation:           - continue support with blood, octreotide and ppi                           consider repeat EGD if this doesnt work as not 100%                             confident varies were source though clinical                            scenario very consistent w/ that Procedure Code(s):        --- Professional ---                           [phone], Esophagoscopy, flexible, transoral; with                            band ligation of esophageal varices                           99152, 59, Moderate sedation services provided by                            the same physician or other qualified health care                            professional performing the diagnostic or                            therapeutic service that the sedation supports,                            requiring the presence of an independent trained  observer to assist in the monitoring of the                            patient's level of consciousness and physiological                            status; initial 15 minutes of intraservice time,                            patient age 65 years or older Diagnosis Code(s):        --- Professional ---                           I85.00, Esophageal varices without bleeding                           K92.2, Gastrointestinal hemorrhage, unspecified                           K92.0, Hematemesis CPT copyright 2019 [employer] Association. All rights reserved. The codes documented in this report are preliminary and upon coder review may  be revised to meet current compliance requirements. Gatha Mayer, MD 05/13/2018 6:22:51 AM This report has been signed electronically. Number of Addenda: 0

## 2018-05-13 NOTE — Consult Note (Addendum)
NAME:  Matthew Lewis, MRN:  [id number], DOB:  [birth year], LOS: 1 ADMISSION DATE:  05/12/2018, CONSULTATION DATE:  05/13/18 REFERRING MD:  TRH  , CHIEF COMPLAINT:  Hypotension   Brief History   62 y/o M with metastatic cholangiocarcinoma, biliary obstruction s/p perc drains admitted 5/5 with syncopal episode, hypotension.  Developed hematemesis with subsequent ABLA.  EGD with concern for variceal bleeding s/p banding.  Required vasopressors for hypotension.   History of present illness   63 y/o M admitted 5/5 after suffering a syncopal episode at home. On presentation, he was hypotensive and tachycardic.  Initial work up included a CT of the head / cervical spine which was negative, CXR with known left basilar metastases, WBC 46, Hgb 7.7 (down from 10 early in the week prior to presentation), lactic acid 3.6.  COVID negative. He was initially treated as sepsis and treated empirically with antibiotics.  Overnight 5/5, he developed hematemesis with Hgb drop to 5 requiring urgent EGD and transfusion of 5 units PRBC's.  Patient has requested DNR and did not want intubation for procedure.  EGD concerning for variceal bleeding s/p banding with stabilization.  Vasopressors initiated and weaning.  PCCM consulted for evaluation of hypotension.   Past Medical History  Metastatic Cholangiocarcinoma  Portal Venous Thrombosis - not on anticoagulation due to GIB Biliary obstruction secondary to malignancy with percutaneous drains Cirrhosis  Substance Abuse - oxycodone, THC DM II  Former [place] Events   5/05 Admit  5/06 PCCM consulted for hypotension   Consults:  GI  PCCM   Procedures:  Port-A-Cath R Chest 12/09/17 >>   Significant Diagnostic Tests:  EGD 5/6 >> Grade II, large (> 5 mm) varices were found in the lower third of the esophagus. Four bands were successfully placed with complete eradication, resulting in deflation of varices. Estimated blood loss was minimal.   Micro Data:  COVID 5/5 >> negative  MRSA PCR 5/5 >> negative  BCx2 5/5 >>  UC 5/5 >> negative   Antimicrobials:  Meropenem 5/5 >> 5/6 Flagyl 5/5 >> 5/6   Interim history/subjective:  Pt reports feeling much better post EGD.  States "I almost died last night".  Pt asking for something to eat.   Objective   Blood pressure 106/74, pulse (!) 128, temperature 98.8 F (37.1 C), temperature source Oral, resp. rate 20, weight 89.5 kg, SpO2 100 %.        Intake/Output Summary (Last 24 hours) at 05/13/2018 0915 Last data filed at 05/13/2018 0845 Gross per 24 hour  Intake 4647.65 ml  Output 2951 ml  Net 1696.65 ml   Filed Weights   05/13/18 0500  Weight: 89.5 kg    Examination: General: chronically ill appearing adult male lying in bed in NAD HEENT: MM pink/dry Neuro: Awake/alert, oriented, MAE / normal strength  CV: s1s2 rrr, no m/r/g PULM: even/non-labored, lungs bilaterally clear  GI: soft, non-tender, bsx4 hypoactive  Extremities: warm/dry, no edema  Skin: no rashes or lesions  Resolved Hospital Problem list     Assessment & Plan:   Hemorrhagic Shock in setting of GIB / Acute Blood Loss Anemia Volume Depletion in setting of High Output Biliary Drains P: GI following, appreciate input  Add stress dose steroids  Wean vasopressors off for MAP >60-65, normal mental status & UOP DNR in the event of arrest  Follow H/H Q4, transfuse per ICU guidelines  Volume resuscitation with PRBC's   Acute GIB / Hematemesis suspected secondary to Varices -s/p  EGD 5/6 with banding of varices Cirrhosis  Metastatic Cholangiocarcinoma  -on second line chemotherapy Biliary Obstruction with Perc Drains -high output in the weeks leading up to admit per ONC P: Continue PPI / octreotide gtts Monitor for further bleeding Plan for second look EGD tomorrow per GI  ONC input appreciated  Hold further abx for now  Leukocytosis  -suspect from G-CSF -hx ESBL in 12/2017 P: Follow WBC /  fever curve  Hold further abx for now   Hypomagnesemia  At Risk AKI  P: Trend BMP / urinary output Replace electrolytes as indicated, 1gm Mg+ Avoid nephrotoxic agents, ensure adequate renal perfusion  DM II  P: SSI AC/HS with QHS coverage  At Risk Aspiration in setting of Emesis P: Aspiration precautions   Chronic Pain  P: Resume home gabapentin, cymbalta Pt is recovered narcotics abuser and wants to avoid all narcotics if possible  Best practice:  Diet: Clear liquids  Pain/Anxiety/Delirium protocol (if indicated): n/a VAP protocol (if indicated): n/a DVT prophylaxis: SCD's  GI prophylaxis: PPI / octreotide gtt  Glucose control: n/a  Mobility: as tolerated  Code Status: DNR  Family Communication: Patient updated on plan of care  Disposition: ICU.  Will transfer [facility] as of 5/7 am   Labs   CBC: Recent Labs  Lab 05/07/18 0840 05/12/18 0201 05/12/18 2113 05/13/18 0137 05/13/18 0303 05/13/18 0619  WBC 11.2* 46.5*  --   --  68.9*  --   NEUTROABS 8.8* 40.5*  --   --  63.7*  --   HGB 10.1* 7.7* 5.0* 9.0* 8.7* 7.9*  HCT 30.7* 24.2* 15.9* 26.6* 25.9* 23.5*  MCV 96.8 104.3*  --   --  92.8  --   PLT 150 181  --   --  173  --     Basic Metabolic Panel: Recent Labs  Lab 05/07/18 0840 05/12/18 0201 05/13/18 0303  NA 133* 132* 137  K 3.4* 4.5 5.5*  CL 100 103 112*  CO2 24 20* 17*  GLUCOSE 244* 172* 240*  BUN 13 21 41*  CREATININE 0.77 0.70 0.69  CALCIUM 8.8* 7.7* 7.3*  MG  --   --  1.6*   GFR: Estimated Creatinine Clearance: 99.4 mL/min (by C-G formula based on SCr of 0.69 mg/dL). Recent Labs  Lab 05/07/18 0840 05/12/18 0201 05/12/18 0746 05/13/18 0303 05/13/18 0801  WBC 11.2* 46.5*  --  68.9*  --   LATICACIDVEN  --  3.6* 2.1*  --  2.8*    Liver Function Tests: Recent Labs  Lab 05/07/18 0840 05/12/18 0201 05/13/18 0303  AST 40 44* 30  ALT 34 35 29  ALKPHOS 263* 206* 148*  BILITOT 1.0 1.1 4.6*  PROT 7.8 5.9* 4.4*  ALBUMIN 2.8* 2.3*  2.2*   No results for input(s): LIPASE, AMYLASE in the last 168 hours. No results for input(s): AMMONIA in the last 168 hours.  ABG    Component Value Date/Time   PHART 7.416 10/18/2008 2105   PCO2ART 41.6 10/18/2008 2105   PO2ART 216.0 (H) 10/18/2008 2105   HCO3 15.6 (L) 02/24/2018 0309   TCO2 16 (L) 02/24/2018 0309   ACIDBASEDEF 9.0 (H) 02/24/2018 0309   O2SAT 99.0 02/24/2018 0309     Coagulation Profile: Recent Labs  Lab 05/12/18 0201 05/12/18 2113  INR 1.3* 1.7*    Cardiac Enzymes: Recent Labs  Lab 05/12/18 0201  TROPONINI <0.03    HbA1C: Hgb A1c MFr Bld  Date/Time Value Ref Range Status  11/09/2017 07:52  AM 4.9 4.8 - 5.6 % Final    Comment:    (NOTE) Pre diabetes:          5.7%-6.4% Diabetes:              >6.4% Glycemic control for   <7.0% adults with diabetes   10/27/2017 02:49 AM 6.5 (H) 4.8 - 5.6 % Final    Comment:    (NOTE)         Prediabetes: 5.7 - 6.4         Diabetes: >6.4         Glycemic control for adults with diabetes: <7.0     CBG: Recent Labs  Lab 05/12/18 1956 05/12/18 2216 05/13/18 0149 05/13/18 0408 05/13/18 0745  GLUCAP 156* 155* 184* 215* 256*    Review of Systems: Positives in [place]  Gen: Denies fever, chills, weight change, fatigue, night sweats HEENT: Denies blurred vision, double vision, hearing loss, tinnitus, sinus congestion, rhinorrhea, sore throat, neck stiffness, dysphagia PULM: Denies shortness of breath, cough, sputum production, hemoptysis, wheezing CV: Denies chest pain, edema, orthopnea, paroxysmal nocturnal dyspnea, palpitations GI: Denies abdominal pain, nausea, vomiting / hematesis, diarrhea, hematochezia, melena, constipation, change in bowel habits GU: Denies dysuria, hematuria, polyuria, oliguria, urethral discharge Endocrine: Denies hot or cold intolerance, polyuria, polyphagia or appetite change Derm: Denies rash, dry skin, scaling or peeling skin change Heme: Denies easy bruising, bleeding,  bleeding gums Neuro: Denies headache, numbness, weakness, slurred speech, loss of memory or consciousness   Past Medical History  He,  has a past medical history of Abscess of anal and rectal regions, Alcoholic cirrhosis ([place]), Arthritis, Cirrhosis ([place]), Colon cancer ([place]) (05/2005), Cough, DM type 2 with diabetic peripheral neuropathy ([place]), Fatigue, Fistula, anal, History of substance abuse ([place]), Hypertension, PTSD (post-traumatic stress disorder), and Sleep deprivation.   Surgical History    Past Surgical History:  Procedure Laterality Date  . ABSCESS DRAINAGE     "probably 15-20 so far at least; rectal"  . ANAL FISTULECTOMY  12/13/1998  . ANTERIOR FUSION CERVICAL SPINE  2010   "triple"; "did 2 surgeries on the same day in 9528 due to complications"  . CORONARY ANGIOPLASTY  2001  . detached muscle  2010   right chest; "after cervical fusion complications"  . ELBOW SURGERY  2007   "cut out part of a muscle"; left  . ESOPHAGEAL BANDING  02/25/2018   Procedure: ESOPHAGEAL BANDING;  Surgeon: Ladene Artist, MD;  Location: [facility] ENDOSCOPY;  Service: Endoscopy;;  . ESOPHAGOGASTRODUODENOSCOPY (EGD) WITH PROPOFOL N/A 02/25/2018   Procedure: ESOPHAGOGASTRODUODENOSCOPY (EGD) WITH PROPOFOL;  Surgeon: Ladene Artist, MD;  [facility] END[facility]COPY;  Service: Endoscopy;  Laterality: N/A;  . EXAMINATION UNDER ANESTHESIA  03/22/2005   fistula  . EXAMINATION UNDER ANESTHESIA  06/06/2011   Procedure: EXAM UNDER ANESTHESIA;  Surgeon: Stark Klein, MD;  Location: [facility];  Service: General;  Laterality: N/A;  . HEMICOLECTOMY  06/06/2005   left  . INCISION AND DRAINAGE PERIRECTAL ABSCESS  03/30/2005  . INCISION AND DRAINAGE PERIRECTAL ABSCESS  12/13/2004  . INCISION AND DRAINAGE PERIRECTAL ABSCESS  07/14/2001  . INCISION AND DRAINAGE PERIRECTAL ABSCESS  08/12/2010   horseshoe abscess; Dr Redmond Pulling  . INCISION AND DRAINAGE PERIRECTAL ABSCESS  06/06/2011   Procedure: IRRIGATION AND DEBRIDEMENT PERIRECTAL ABSCESS;   Surgeon: Stark Klein, MD;  Location: [facility];  Service: General;  Laterality: N/A;  . INCISION AND DRAINAGE PERIRECTAL ABSCESS N/A 02/24/2017   Procedure: IRRIGATION AND DEBRIDEMENT PERIRECTAL ABSCESS;  Surgeon: Ralene Ok,  MD;  Location: [place];  Service: General;  Laterality: N/A;  . IR CATHETER TUBE CHANGE  02/19/2018  . IR CHOLANGIOGRAM EXISTING TUBE  12/23/2017  . IR CHOLANGIOGRAM EXISTING TUBE  01/02/2018  . IR CHOLANGIOGRAM EXISTING TUBE  05/04/2018  . IR EXCHANGE BILIARY DRAIN  11/07/2017  . IR EXCHANGE BILIARY DRAIN  12/09/2017  . IR EXCHANGE BILIARY DRAIN  02/03/2018  . IR EXCHANGE BILIARY DRAIN  02/03/2018  . IR EXCHANGE BILIARY DRAIN  04/03/2018  . IR EXCHANGE BILIARY DRAIN  04/03/2018  . IR EXCHANGE BILIARY DRAIN  04/23/2018  . IR EXCHANGE BILIARY DRAIN  04/28/2018  . IR EXCHANGE BILIARY DRAIN  04/28/2018  . IR FLUORO RM 30-60 MIN  04/15/2018  . IR IMAGING GUIDED PORT INSERTION  12/09/2017  . IR INT EXT BILIARY DRAIN WITH CHOLANGIOGRAM  10/26/2017  . IR INT EXT BILIARY DRAIN WITH CHOLANGIOGRAM  12/23/2017  . IR PARACENTESIS  02/25/2018  . IR PATIENT EVAL TECH 0-60 MINS  12/02/2017  . IR RADIOLOGIST EVAL & MGMT  11/06/2017  . IR RADIOLOGIST EVAL & MGMT  01/05/2018  . [place]  . PERCUTANEOUS PINNING PHALANX FRACTURE OF HAND  ~ 2008   "plates in 2 places"; right  . PLACEMENT OF SETON N/A 09/05/2017   Procedure: PLACEMENT OF SETON;  Surgeon: Leighton Ruff, MD;  [facility];  Service: General;  Laterality: N/A;  . RADIOLOGY WITH ANESTHESIA N/A 10/25/2017   Procedure: MRI WITH ANESTHESIA;  Surgeon: Radiologist, Medication, MD;  Location: [place];  Service: Radiology;  Laterality: N/A;  . THORACIC DISCECTOMY  [phone]     Social History   reports that he quit smoking about 27 years ago. His smoking use included cigarettes. He has a 10.00 pack-year smoking history. He has never used smokeless tobacco. He reports previous alcohol use. He reports  previous drug use. Drugs: Oxycodone and Marijuana.   Family History   His family history includes Cancer in his mother and sister; Heart disease in his brother, father, and mother.   Allergies Allergies  Allergen Reactions  . Morphine And Related Other (See Comments)    Patient is recovering from drug addiction and wants to avoid any narcotics     Home Medications  Prior to Admission medications   Medication Sig Start Date End Date Taking? Authorizing Provider  Ca Carbonate-Mag Hydroxide (ROLAIDS PO) Take 1-2 tablets by mouth 2 (two) times daily as needed (heartburn).   Yes [provider]  cetirizine (ZYRTEC) 10 MG tablet Take 10 mg by mouth at bedtime.   Yes [provider]  DULoxetine (CYMBALTA) 30 MG capsule Take 30 mg by mouth 2 (two) times daily.   Yes [provider]  fexofenadine (ALLEGRA) 180 MG tablet Take 180 mg by mouth daily after breakfast.    Yes [provider]  gabapentin (NEURONTIN) 400 MG capsule Take 400-800 mg by mouth See admin instructions. Take 400 mg by mouth in the morning and 800 mg in the evening   Yes [provider]  insulin detemir (LEVEMIR) 100 UNIT/ML injection Inject 10-62 Units into the skin See admin instructions. Take 62 in the am and 10 in the pm   Yes [provider]  lidocaine-prilocaine (EMLA) cream Apply to port site 1 hour prior to access Patient taking differently: Apply 1 application topically once. Apply to port site 1 hour prior to access 01/20/18  Yes [name], MD  Multiple Vitamin (MULTIVITAMIN) tablet Take 1 tablet by  mouth daily.   Yes [provider]  Sodium Chloride Flush (NORMAL SALINE FLUSH) 0.9 % SOLN Inject 10 mLs into the vein daily. 04/23/18  Yes [name], MD  tamsulosin (FLOMAX) 0.4 MG CAPS capsule TAKE 1 CAPSULE(0.4 MG) BY MOUTH DAILY AFTER SUPPER Patient taking differently: Take 0.4 mg by mouth daily after supper.  03/12/18  Yes [name], MD  lactose free nutrition  (BOOST PLUS) LIQD Take 237 mLs by mouth 3 (three) times daily with meals. Patient not taking: Reported on 05/12/2018 12/11/17   Regalado, Jerald Kief A, MD  pantoprazole (PROTONIX) 40 MG tablet Take 1 tablet (40 mg total) by mouth 2 (two) times daily. Patient not taking: Reported on 05/12/2018 02/27/18   Thurnell Lose, MD     Critical care time: 3 minutes     Noe Gens, NP-C [facility] Pgr: [phone] or if no answer [phone] 05/13/2018, 9:15 AM

## 2018-05-13 NOTE — Progress Notes (Signed)
CRITICAL VALUE ALERT  Critical Value:  68.9 WBC  Date & Time Notied:  0420  Provider Notified: Yes   Orders Received/Actions taken: Yes

## 2018-05-14 ENCOUNTER — Encounter (HOSPITAL_COMMUNITY)
Admission: EM | Disposition: A | Discharge status: Home / Self Care | Payer: Self-pay | Source: Home / Self Care | Attending: Internal Medicine

## 2018-05-14 ENCOUNTER — Inpatient Hospital Stay (HOSPITAL_COMMUNITY): Payer: Medicare Other | Admitting: Certified Registered"

## 2018-05-14 ENCOUNTER — Encounter (HOSPITAL_COMMUNITY): Payer: Self-pay | Admitting: *Deleted

## 2018-05-14 DIAGNOSIS — I959 Hypotension, unspecified: Secondary | ICD-10-CM

## 2018-05-14 DIAGNOSIS — C221 Intrahepatic bile duct carcinoma: Secondary | ICD-10-CM

## 2018-05-14 DIAGNOSIS — I8501 Esophageal varices with bleeding: Secondary | ICD-10-CM

## 2018-05-14 HISTORY — PX: ESOPHAGOGASTRODUODENOSCOPY (EGD) WITH PROPOFOL: SHX5813

## 2018-05-14 HISTORY — PX: ESOPHAGEAL BANDING: SHX5518

## 2018-05-14 LAB — BASIC METABOLIC PANEL
Anion gap: 3 — ABNORMAL LOW (ref 5–15)
BUN: 29 mg/dL — ABNORMAL HIGH (ref 8–23)
CO2: 20 mmol/L — ABNORMAL LOW (ref 22–32)
Calcium: 7.9 mg/dL — ABNORMAL LOW (ref 8.9–10.3)
Chloride: 109 mmol/L (ref 98–111)
Creatinine, Ser: 0.66 mg/dL (ref 0.61–1.24)
GFR calc Af Amer: >60 mL/min (ref >60)
GFR calc non Af Amer: >60 mL/min (ref >60)
Glucose, Bld: 236 mg/dL — ABNORMAL HIGH (ref 70–99)
Potassium: 4.6 mmol/L (ref 3.5–5.1)
Sodium: 132 mmol/L — ABNORMAL LOW (ref 135–145)

## 2018-05-14 LAB — PREPARE RBC (CROSSMATCH)

## 2018-05-14 LAB — CBC
HCT: 23.5 % — ABNORMAL LOW (ref 39.0–52.0)
Hemoglobin: 8 g/dL — ABNORMAL LOW (ref 13.0–17.0)
MCH: 31 pg (ref 26.0–34.0)
MCHC: 34 g/dL (ref 30.0–36.0)
MCV: 91.1 fL (ref 80.0–100.0)
Platelets: 97 10*3/uL — ABNORMAL LOW (ref 150–400)
RBC: 2.58 MIL/uL — ABNORMAL LOW (ref 4.22–5.81)
RDW: 16.2 % — ABNORMAL HIGH (ref 11.5–15.5)
WBC: 35.8 10*3/uL — ABNORMAL HIGH (ref 4.0–10.5)
nRBC: 0 % (ref 0.0–0.2)

## 2018-05-14 LAB — FIBRINOGEN: Fibrinogen: 251 mg/dL (ref 210–475)

## 2018-05-14 LAB — HEMOGLOBIN AND HEMATOCRIT, BLOOD
HCT: 23.4 % — ABNORMAL LOW (ref 39.0–52.0)
HCT: 24.6 % — ABNORMAL LOW (ref 39.0–52.0)
HCT: 22.7 % — ABNORMAL LOW (ref 39.0–52.0)
Hemoglobin: 7.8 g/dL — ABNORMAL LOW (ref 13.0–17.0)
Hemoglobin: 8.6 g/dL — ABNORMAL LOW (ref 13.0–17.0)
Hemoglobin: 7.6 g/dL — ABNORMAL LOW (ref 13.0–17.0)

## 2018-05-14 LAB — APTT: aPTT: 37 seconds — ABNORMAL HIGH (ref 24–36)

## 2018-05-14 LAB — GLUCOSE, CAPILLARY
Glucose-Capillary: 210 mg/dL — ABNORMAL HIGH (ref 70–99)
Glucose-Capillary: 193 mg/dL — ABNORMAL HIGH (ref 70–99)
Glucose-Capillary: 190 mg/dL — ABNORMAL HIGH (ref 70–99)

## 2018-05-14 LAB — PROTIME-INR
INR: 1.8 — ABNORMAL HIGH (ref 0.8–1.2)
Prothrombin Time: 20.8 seconds — ABNORMAL HIGH (ref 11.4–15.2)

## 2018-05-14 LAB — MAGNESIUM: Magnesium: 1.9 mg/dL (ref 1.7–2.4)

## 2018-05-14 SURGERY — ESOPHAGOGASTRODUODENOSCOPY (EGD) WITH PROPOFOL
Anesthesia: Monitor Anesthesia Care

## 2018-05-14 MED ORDER — PROPOFOL 500 MG/50ML IV EMUL
INTRAVENOUS | Status: DC | PRN
  Administered 2018-05-14 (×3): 20 mg via INTRAVENOUS
  Administered 2018-05-14: 50 mg via INTRAVENOUS
  Administered 2018-05-14: 30 mg via INTRAVENOUS

## 2018-05-14 MED ORDER — PROPOFOL 500 MG/50ML IV EMUL
INTRAVENOUS | Status: DC | PRN
  Administered 2018-05-14: 75 ug/kg/min via INTRAVENOUS

## 2018-05-14 MED ORDER — PHENYLEPHRINE HCL (PRESSORS) 10 MG/ML IV SOLN
INTRAVENOUS | Status: DC | PRN
  Administered 2018-05-14: 120 ug via INTRAVENOUS
  Administered 2018-05-14: 80 ug via INTRAVENOUS
  Administered 2018-05-14: 120 ug via INTRAVENOUS
  Administered 2018-05-14: 100 ug via INTRAVENOUS
  Administered 2018-05-14 (×2): 120 ug via INTRAVENOUS
  Administered 2018-05-14: 100 ug via INTRAVENOUS
  Administered 2018-05-14 (×3): 120 ug via INTRAVENOUS

## 2018-05-14 MED ORDER — PROPOFOL 10 MG/ML IV BOLUS
INTRAVENOUS | Status: AC
  Filled 2018-05-14: qty 80

## 2018-05-14 SURGICAL SUPPLY — 15 items

## 2018-05-14 NOTE — Anesthesia Preprocedure Evaluation (Signed)
Anesthesia Evaluation  Patient identified by MRN, date of birth, ID band Patient awake    Reviewed: Allergy & Precautions, NPO status , Patient's Chart, lab work & pertinent test results  Airway Mallampati: II  TM Distance: >3 FB Neck ROM: Full    Dental no notable dental hx.    Pulmonary neg pulmonary ROS, former smoker,    Pulmonary exam normal breath sounds clear to auscultation       Cardiovascular hypertension, + CAD  Normal cardiovascular exam Rhythm:Regular Rate:Normal     Neuro/Psych negative neurological ROS  negative psych ROS   GI/Hepatic negative GI ROS, (+) Cirrhosis   Esophageal Varices  substance abuse  alcohol use,   Endo/Other  diabetes, Type 2  Renal/GU negative Renal ROS  negative genitourinary   Musculoskeletal negative musculoskeletal ROS (+)   Abdominal   Peds negative pediatric ROS (+)  Hematology  (+) anemia ,   Anesthesia Other Findings   Reproductive/Obstetrics negative OB ROS                             Anesthesia Physical Anesthesia Plan  ASA: III  Anesthesia Plan: MAC   Post-op Pain Management:    Induction:   PONV Risk Score and Plan: 1 and Treatment may vary due to age or medical condition  Airway Management Planned: Nasal Cannula  Additional Equipment:   Intra-op Plan:   Post-operative Plan:   Informed Consent: I have reviewed the patients History and Physical, chart, labs and discussed the procedure including the risks, benefits and alternatives for the proposed anesthesia with the patient or authorized representative who has indicated his/her understanding and acceptance.     Dental advisory given  Plan Discussed with: CRNA  Anesthesia Plan Comments:         Anesthesia Quick Evaluation

## 2018-05-14 NOTE — Progress Notes (Signed)
Inpatient Diabetes Program Recommendations  AACE/ADA: New Consensus Statement on Inpatient Glycemic Control (2015)  Target Ranges:  Prepandial:   less than 140 mg/dL      Peak postprandial:   less than 180 mg/dL (1-2 hours)      Critically ill patients:  140 - 180 mg/dL   Lab Results  Component Value Date   GLUCAP 210 (H) 05/14/2018   HGBA1C 4.9 11/09/2017    Review of Glycemic Control  Diabetes history: DM2 Outpatient Diabetes medications: Levemir 62 units in am and 10 units QHS Current orders for Inpatient glycemic control: Novolog 0-9 units tidwc and hs  HgbA1C - Pt has low H/H, and HgbA1C would not be accurate On Solucortef 50 Q6H  Inpatient Diabetes Program Recommendations:     Add Levemir 5 units bid  Will follow closely.  Thank you. Lorenda Peck, RD, LDN, CDE Inpatient Diabetes Coordinator [phone]

## 2018-05-14 NOTE — Op Note (Signed)
[facility] Patient Name: Matthew Lewis Procedure Date: 05/14/2018 MRN: [id number] Attending MD: [name] , MD Date of Birth: 12-13-1955 CSN: [id number] Age: 63 Admit Type: Inpatient Procedure:                Upper GI endoscopy Indications:              Recent gastrointestinal bleeding suspected from                            esophageal varices, banded x 4, history of                            metastatic cholangiocarcinoma, history of                            cirrhosis, history of duodenal ulcer noted                            previously at site of biliary stent, relook                            endoscopy done to clarify source of prior bleeding Providers:                Carlota Raspberry. Havery Moros, MD, Cleda Daub, RN, Tinnie Gens, Technician Referring MD:              Medicines:                Monitored Anesthesia Care Complications:            No immediate complications. Estimated blood loss:                            Minimal. Estimated Blood Loss:     Estimated blood loss was minimal. Procedure:                Pre-Anesthesia Assessment:                           - Prior to the procedure, a History and Physical                            was performed, and patient medications and                            allergies were reviewed. The patient's tolerance of                            previous anesthesia was also reviewed. The risks                            and benefits of the procedure and the sedation                            options and risks  were discussed with the patient.                            All questions were answered, and informed consent                            was obtained. Prior Anticoagulants: The patient has                            taken no previous anticoagulant or antiplatelet                            agents. ASA Grade Assessment: III - A patient with                            severe  systemic disease. After reviewing the risks                            and benefits, the patient was deemed in                            satisfactory condition to undergo the procedure.                           After obtaining informed consent, the endoscope was                            passed under direct vision. Throughout the                            procedure, the patient's blood pressure, pulse, and                            oxygen saturations were monitored continuously. The                            GIF-H190 ([id number]) Olympus gastroscope was                            introduced through the mouth, and advanced to the                            second part of duodenum. The upper GI endoscopy was                            accomplished without difficulty. The patient                            tolerated the procedure well. Scope In: Scope Out: Findings:      Multiple colums of medium to large varices were found in the middle       third of the esophagus and in the lower third of the esophagus. 4       previously placed bands were noted. A few red markings were  noted on       unbanded varices and three bands were successfully placed in the distal       esophagus with deflation.      Esophagitis was found with small ulceration was noted at the GEJ.      The exam of the esophagus was otherwise normal.      Diffuse inflammation characterized by congestion, erythema and       friability was found in the entire examined stomach consistent with       portal hypertensive gastritis. No focal ulcerations were noted. The       gastric mucosa had adherent mucous and secretions which took several       minutes to lavage and clear the mucosa. A possible GOV1 varix was noted       in the cardia versus prominent fold, if it was a varix was not large and       no high risk stigmata for bleeding.      The exam of the stomach was otherwise normal.      Nodular mucosa was found in the ampulla,  perhaps related to known       malignancy. Biopsies not taken given recent significant bleeding and       patient has known metastatic disease. Previously noted biliary drain was       not noted (percutaneous drain now in place)      The exam of the duodenum was otherwise normal. No blood noted anywhere       in the stomach or duodenum. No ulcers seen. Impression:               - Esophageal varices with evidence of prior                            banding. Varices remain sizeable with some red                            markings noted. 3 additional bands placed with                            deflation                           - Esophagitis at the GEJ.                           - Portal hypertensive gastritis.                           - ? GOV1 varix versus prominent fold, did not                            appear prominent, no stigmata for recent bleeding.                           - Nodular mucosa at the ampulla as outlined                           Overall, I suspect the patient likely did have  esophageal variceal bleeding based on todays's                            exam. Additional bands placed to further eradicate                            the varices. No active bleeding appreciated at                            present Moderate Sedation:      No moderate sedation, case performed with MAC Recommendation:           -                           - Return patient to ICU for ongoing care.                           - Continue medications (octreotide, IV protonix,                            antibiotics)                           - Trend Hgb, transfuse if needed                           - Clear liquid diet okay later this evening                           - GI service will continue to follow Procedure Code(s):        --- Professional ---                           [phone], Esophagogastroduodenoscopy, flexible,                            transoral; with band  ligation of esophageal/gastric                            varices Diagnosis Code(s):        --- Professional ---                           I85.00, Esophageal varices without bleeding                           K20.9, Esophagitis, unspecified                           K29.70, Gastritis, unspecified, without bleeding                           K31.89, Other diseases of stomach and duodenum                           K92.2, Gastrointestinal hemorrhage, unspecified CPT copyright 2019 American Medical Association. All rights reserved. The  codes documented in this report are preliminary and upon coder review may  be revised to meet current compliance requirements. Remo Lipps P. Armbruster, MD 05/14/2018 4:43:28 PM This report has been signed electronically. Number of Addenda: 0

## 2018-05-14 NOTE — Transfer of Care (Signed)
Immediate Anesthesia Transfer of Care Note  Patient: Matthew Lewis  Procedure(s) Performed: ESOPHAGOGASTRODUODENOSCOPY (EGD) WITH PROPOFOL (N/A ) ESOPHAGEAL BANDING  Patient Location: PACU and Endoscopy Unit  Anesthesia Type:MAC  Level of Consciousness: awake, oriented and patient cooperative  Airway & Oxygen Therapy: Patient Spontanous Breathing and Patient connected to nasal cannula oxygen  Post-op Assessment: Report given to RN and Post -op Vital signs reviewed and stable  Post vital signs: Reviewed and stable  Last Vitals:  Vitals Value Taken Time  BP    Temp    Pulse    Resp    SpO2      Last Pain:  Vitals:   05/14/18 1312  TempSrc: Oral  PainSc: 0-No pain         Complications: No apparent anesthesia complications

## 2018-05-14 NOTE — Progress Notes (Signed)
Patient ID: Matthew Lewis, male   DOB: January 07, 1956, 64 y.o.   MRN: [id number]    Progress Note   Subjective    Pt had a bloody BM again last pm, and hgb dropped back to 6.8- has been  Transfused 2 units- hgb 8.0 this am .  One small black BM this am per nurse  Pt says he feels Ok - denies any pain, nausea  Continues on Octreotide/PPI infusion/ Levophed   Objective   Vital signs in last 24 hours: Temp:  [97.5 F (36.4 C)-98.5 F (36.9 C)] 97.6 F (36.4 C) (05/07 0800) Pulse Rate:  [55-126] 95 (05/07 0830) Resp:  [15-28] 16 (05/07 0830) BP: (88-144)/(38-106) 92/59 (05/07 0830) SpO2:  [90 %-100 %] 92 % (05/07 0830) Weight:  [94.9 kg] 94.9 kg (05/07 0500) Last BM Date: 05/13/18 General:    Older WM in NAD pale, chronically ill appearing Heart:  Regular rate and rhythm; no murmurs Lungs: Respirations even and unlabored, lungs CTA bilaterally Abdomen:  Soft, nontender and nondistended. Normal bowel sounds,Percutaneous biliary drains x2 Extremities:  Without edema. Neurologic:  Alert and oriented,  grossly normal neurologically. Psych:  Cooperative. Normal mood and affect.  Intake/Output from previous day: 05/06 0701 - 05/07 0700 In: 2664.4 [I.V.:1734.3; Blood:630; IV Piggyback:300.1] Out: 1601 [Urine:1425; Drains:300] Intake/Output this shift: Total I/O In: 102.9 [I.V.:102.9] Out: 600 [Urine:600]  Lab Results: Recent Labs    05/12/18 0201  05/13/18 0303  05/13/18 1727 05/13/18 2148 05/14/18 0500  WBC 46.5*  --  68.9*  --   --   --  35.8*  HGB 7.7*   < > 8.7*   < > 8.0* 6.8* 8.0*  HCT 24.2*   < > 25.9*   < > 23.9* 18.8* 23.5*  PLT 181  --  173  --   --   --  97*   < > = values in this interval not displayed.   BMET Recent Labs    05/12/18 0201 05/13/18 0303 05/14/18 0500  NA 132* 137 132*  K 4.5 5.5* 4.6  CL 103 112* 109  CO2 20* 17* 20*  GLUCOSE 172* 240* 236*  BUN 21 41* 29*  CREATININE 0.70 0.69 0.66  CALCIUM 7.7* 7.3* 7.9*   LFT Recent Labs   05/13/18 0303  PROT 4.4*  ALBUMIN 2.2*  AST 30  ALT 29  ALKPHOS 148*  BILITOT 4.6*   PT/INR Recent Labs    05/12/18 2113 05/13/18 2357  LABPROT 19.7* 20.8*  INR 1.7* 1.8*        Assessment / Plan:    #1 63 yo WM with metastatic cholangiocarcinoma with bilary obstruction, progressive disease despite Chemo admitted with hemorrhagic shock secondary to Upper Gi hemorrhage Initial EGD with banding of esophageal varices x 4 - no definite active bleeding from esophagus identified- stomach full of blood - unable to visualize  He bled again last night - transfused x 2 - stable this  am  , on low dose pressor support,no active bleeding hgb 8  Plan;  Pt is scheduled for re-look EGD today- suspect bleeding is gastric- ? Gastric varix Continue octreotide Continue IV PPI BID Serial hgb 's and transfuse  To keep hgb above 7 Continue Rocephin  #2 hx duodenal ulcer with bleed - 02/2018 #3 PVT/ underlying cirrhosis #4 DNR          Active Problems:   Type II diabetes mellitus ([place])   Hyponatremia   Biliary obstruction due to malignant neoplasm ([place])  Malignant neoplasm metastatic to adrenal gland ([facility])   Intrahepatic cholangiocarcinoma ([facility])   Hypotension   SIRS (systemic inflammatory response syndrome) ([facility])   Macrocytic anemia   Syncope and collapse   Hypovolemic shock ([facility])   Idiopathic esophageal varices with bleeding ([facility])     LOS: 2 days      05/14/2018, 9:09 AM

## 2018-05-14 NOTE — H&P (View-Only) (Signed)
Patient ID: Matthew Lewis, male   DOB: 10/31/1955, 62 y.o.   MRN: [id number]    Progress Note   Subjective    Pt had a bloody BM again last pm, and hgb dropped back to 6.8- has been  Transfused 2 units- hgb 8.0 this am .  One small black BM this am per nurse  Pt says he feels Ok - denies any pain, nausea  Continues on Octreotide/PPI infusion/ Levophed   Objective   Vital signs in last 24 hours: Temp:  [97.5 F (36.4 C)-98.5 F (36.9 C)] 97.6 F (36.4 C) (05/07 0800) Pulse Rate:  [55-126] 95 (05/07 0830) Resp:  [15-28] 16 (05/07 0830) BP: (88-144)/(38-106) 92/59 (05/07 0830) SpO2:  [90 %-100 %] 92 % (05/07 0830) Weight:  [94.9 kg] 94.9 kg (05/07 0500) Last BM Date: 05/13/18 General:    Older WM in NAD pale, chronically ill appearing Heart:  Regular rate and rhythm; no murmurs Lungs: Respirations even and unlabored, lungs CTA bilaterally Abdomen:  Soft, nontender and nondistended. Normal bowel sounds,Percutaneous biliary drains x2 Extremities:  Without edema. Neurologic:  Alert and oriented,  grossly normal neurologically. Psych:  Cooperative. Normal mood and affect.  Intake/Output from previous day: 05/06 0701 - 05/07 0700 In: 2664.4 [I.V.:1734.3; Blood:630; IV Piggyback:300.1] Out: 1725 [Urine:1425; Drains:300] Intake/Output this shift: Total I/O In: 102.9 [I.V.:102.9] Out: 600 [Urine:600]  Lab Results: Recent Labs    05/12/18 0201  05/13/18 0303  05/13/18 1727 05/13/18 2148 05/14/18 0500  WBC 46.5*  --  68.9*  --   --   --  35.8*  HGB 7.7*   < > 8.7*   < > 8.0* 6.8* 8.0*  HCT 24.2*   < > 25.9*   < > 23.9* 18.8* 23.5*  PLT 181  --  173  --   --   --  97*   < > = values in this interval not displayed.   BMET Recent Labs    05/12/18 0201 05/13/18 0303 05/14/18 0500  NA 132* 137 132*  K 4.5 5.5* 4.6  CL 103 112* 109  CO2 20* 17* 20*  GLUCOSE 172* 240* 236*  BUN 21 41* 29*  CREATININE 0.70 0.69 0.66  CALCIUM 7.7* 7.3* 7.9*   LFT Recent Labs   05/13/18 0303  PROT 4.4*  ALBUMIN 2.2*  AST 30  ALT 29  ALKPHOS 148*  BILITOT 4.6*   PT/INR Recent Labs    05/12/18 2113 05/13/18 2357  LABPROT 19.7* 20.8*  INR 1.7* 1.8*        Assessment / Plan:    #1 62 yo WM with metastatic cholangiocarcinoma with bilary obstruction, progressive disease despite Chemo admitted with hemorrhagic shock secondary to Upper Gi hemorrhage Initial EGD with banding of esophageal varices x 4 - no definite active bleeding from esophagus identified- stomach full of blood - unable to visualize  He bled again last night - transfused x 2 - stable this  am  , on low dose pressor support,no active bleeding hgb 8  Plan;  Pt is scheduled for re-look EGD today- suspect bleeding is gastric- ? Gastric varix Continue octreotide Continue IV PPI BID Serial hgb 's and transfuse  To keep hgb above 7 Continue Rocephin  #2 hx duodenal ulcer with bleed - 02/2018 #3 PVT/ underlying cirrhosis #4 DNR          Active Problems:   Type II diabetes mellitus (HCC)   Hyponatremia   Biliary obstruction due to malignant neoplasm ([facility])     Malignant neoplasm metastatic to adrenal gland ([facility])   Intrahepatic cholangiocarcinoma (HCC)   Hypotension   SIRS (systemic inflammatory response syndrome) (HCC)   Macrocytic anemia   Syncope and collapse   Hypovolemic shock ([facility]CC)   Idiopathic esophageal varices with bleeding (HCC)     LOS: 2 days   Brandey Vandalen  05/14/2018, 9:09 AM  

## 2018-05-14 NOTE — Progress Notes (Signed)
NAME:  Matthew Lewis, MRN:  [id number], DOB:  [birth year], LOS: 2 ADMISSION DATE:  05/12/2018, CONSULTATION DATE:  05/13/18 REFERRING MD:  TRH  , CHIEF COMPLAINT:  Hypotension   Brief History   63 y/o M with metastatic cholangiocarcinoma, biliary obstruction s/p perc drains admitted 5/5 with syncopal episode, hypotension.  Developed hematemesis with subsequent ABLA.  EGD with concern for variceal bleeding s/p banding.  Required vasopressors for hypotension.   Past Medical History  Metastatic Cholangiocarcinoma  Portal Venous Thrombosis - not on anticoagulation due to GIB Biliary obstruction secondary to malignancy with percutaneous drains Cirrhosis  Substance Abuse - oxycodone, THC DM II  Former [facility] Events   5/05 Admit  5/06 PCCM consulted for hypotension   Consults:  GI  PCCM   Procedures:  Port-A-Cath R Chest 12/09/17 >>   Significant Diagnostic Tests:  EGD 5/6 >> Grade II, large (> 5 mm) varices were found in the lower third of the esophagus. Four bands were successfully placed with complete eradication, resulting in deflation of varices. Estimated blood loss was minimal.  Micro Data:  COVID 5/5 >> negative  MRSA PCR 5/5 >> negative  BCx2 5/5 >>  UC 5/5 >> negative   Antimicrobials:  Meropenem 5/5 >> 5/6 Flagyl 5/5 >> 5/6   Interim history/subjective:  Pt denies acute complaints.  Anxious to get next EGD over.  Two units of blood overnight > hgb decreased to 6.8. Dark bloody stool per staff but pt denies.   Objective   Blood pressure 94/61, pulse 96, temperature 97.6 F (36.4 C), temperature source Oral, resp. rate 17, weight 94.9 kg, SpO2 100 %.        Intake/Output Summary (Last 24 hours) at 05/14/2018 1111 Last data filed at 05/14/2018 1000 Gross per 24 hour  Intake 2681.71 ml  Output 1975 ml  Net 706.71 ml   Filed Weights   05/13/18 0500 05/14/18 0500  Weight: 89.5 kg 94.9 kg    Examination: General: chronically ill appearing  adult male lying in bed in NAD HEENT: MM pink/moist, no jvd Neuro: AAOx4, speech clear, MAE  CV: s1s2 rrr, no m/r/g PULM: even/non-labored, lungs bilaterally clear  GI: soft, non-tender, bsx4 hypoactive, perc biliary drains x2  Extremities: warm/dry, no edema  Skin: no rashes or lesions  Resolved Hospital Problem list     Assessment & Plan:   Hemorrhagic Shock in setting of GIB / Acute Blood Loss Anemia Volume Depletion in setting of High Output Biliary Drains P: Pending repeat EGD at 1400  Appreciate GI assistance  Continue stress dose steroids for now  Wean vasopressors off for MAP of 60-65, normal mental status & UOP  DNR in the event of arrest  Follow H/H Volume resuscitation with PRBC   Acute GIB / Hematemesis suspected secondary to Varices -s/p EGD 5/6 with banding of varices Cirrhosis  Metastatic Cholangiocarcinoma  -on second line chemotherapy Biliary Obstruction with Perc Drains -high output in the weeks leading up to admit per ONC P: Continue PPI / octreotide gtt's Monitor for further bleeding  Transfuse per ICU guidelines  ONC inpt appreciated   Leukocytosis  -suspect from G-CSF -hx ESBL in 12/2017 P: Follow fever curve / WBC trend  Hold further abx for now   Hypomagnesemia  At Risk AKI  P: Trend BMP / urinary output Replace electrolytes as indicated Avoid nephrotoxic agents, ensure adequate renal perfusion  DM II  P: SSI   At Risk Aspiration in setting of Emesis P:  Aspiration precautions   Chronic Pain  P: Continue home gabapentin, cymbalta  Pt wants to avoid narcotics if at all possible > prior drug abuse   Best practice:  Diet: Clear liquids  Pain/Anxiety/Delirium protocol (if indicated): n/a VAP protocol (if indicated): n/a DVT prophylaxis: SCD's  GI prophylaxis: PPI / octreotide gtt  Glucose control: n/a  Mobility: as tolerated  Code Status: DNR  Family Communication: Patient updated on plan of care 5/7  Disposition: ICU.   Transfer back to [facility] as of 5/8 am   Labs   CBC: Recent Labs  Lab 05/12/18 0201  05/13/18 0303  05/13/18 1419 05/13/18 1727 05/13/18 2148 05/14/18 0500 05/14/18 0936  WBC 46.5*  --  68.9*  --   --   --   --  35.8*  --   NEUTROABS 40.5*  --  63.7*  --   --   --   --   --   --   HGB 7.7*   < > 8.7*   < > 7.6* 8.0* 6.8* 8.0* 7.8*  HCT 24.2*   < > 25.9*   < > 22.9* 23.9* 18.8* 23.5* 23.4*  MCV 104.3*  --  92.8  --   --   --   --  91.1  --   PLT 181  --  173  --   --   --   --  97*  --    < > = values in this interval not displayed.    Basic Metabolic Panel: Recent Labs  Lab 05/12/18 0201 05/13/18 0303 05/14/18 0500  NA 132* 137 132*  K 4.5 5.5* 4.6  CL 103 112* 109  CO2 20* 17* 20*  GLUCOSE 172* 240* 236*  BUN 21 41* 29*  CREATININE 0.70 0.69 0.66  CALCIUM 7.7* 7.3* 7.9*  MG  --  1.6* 1.9   GFR: Estimated Creatinine Clearance: 109.6 mL/min (by C-G formula based on SCr of 0.66 mg/dL). Recent Labs  Lab 05/12/18 0201 05/12/18 0746 05/13/18 0303 05/13/18 0801 05/14/18 0500  WBC 46.5*  --  68.9*  --  35.8*  LATICACIDVEN 3.6* 2.1*  --  2.8*  --     Liver Function Tests: Recent Labs  Lab 05/12/18 0201 05/13/18 0303  AST 44* 30  ALT 35 29  ALKPHOS 206* 148*  BILITOT 1.1 4.6*  PROT 5.9* 4.4*  ALBUMIN 2.3* 2.2*   No results for input(s): LIPASE, AMYLASE in the last 168 hours. No results for input(s): AMMONIA in the last 168 hours.  ABG    Component Value Date/Time   PHART 7.416 10/18/2008 2105   PCO2ART 41.6 10/18/2008 2105   PO2ART 216.0 (H) 10/18/2008 2105   HCO3 15.6 (L) 02/24/2018 0309   TCO2 16 (L) 02/24/2018 0309   ACIDBASEDEF 9.0 (H) 02/24/2018 0309   O2SAT 99.0 02/24/2018 0309     Coagulation Profile: Recent Labs  Lab 05/12/18 0201 05/12/18 2113 05/13/18 2357  INR 1.3* 1.7* 1.8*    Cardiac Enzymes: Recent Labs  Lab 05/12/18 0201  TROPONINI <0.03    HbA1C: Hgb A1c MFr Bld  Date/Time Value Ref Range Status  11/09/2017 07:52 AM  4.9 4.8 - 5.6 % Final    Comment:    (NOTE) Pre diabetes:          5.7%-6.4% Diabetes:              >6.4% Glycemic control for   <7.0% adults with diabetes   10/27/2017 02:49 AM 6.5 (H) 4.8 - 5.6 %  Final    Comment:    (NOTE)         Prediabetes: 5.7 - 6.4         Diabetes: >6.4         Glycemic control for adults with diabetes: <7.0     CBG: Recent Labs  Lab 05/13/18 0745 05/13/18 1133 05/13/18 1602 05/13/18 2216 05/14/18 0733  GLUCAP 256* 237* 341* 298* 210*    Critical care time: 30 minutes     Noe Gens, NP-C [facility] Pgr: [phone] or if no answer [phone] 05/14/2018, 11:11 AM

## 2018-05-14 NOTE — Anesthesia Postprocedure Evaluation (Signed)
Anesthesia Post Note  Patient: Matthew Lewis  Procedure(s) Performed: ESOPHAGOGASTRODUODENOSCOPY (EGD) WITH PROPOFOL (N/A ) ESOPHAGEAL BANDING     Patient location during evaluation: Endoscopy Anesthesia Type: MAC Level of consciousness: awake and alert Pain management: pain level controlled Vital Signs Assessment: post-procedure vital signs reviewed and stable Respiratory status: spontaneous breathing, nonlabored ventilation, respiratory function stable and patient connected to nasal cannula oxygen Cardiovascular status: stable and blood pressure returned to baseline Postop Assessment: no apparent nausea or vomiting Anesthetic complications: no    Last Vitals:  Vitals:   05/14/18 1730 05/14/18 1745  BP: 94/62 113/69  Pulse: 81 83  Resp: 19 20  Temp:    SpO2: 98% 100%    Last Pain:  Vitals:   05/14/18 1632  TempSrc:   PainSc: 0-No pain                 Montez Hageman

## 2018-05-14 NOTE — Interval H&P Note (Signed)
History and Physical Interval Note:  05/14/2018 3:34 PM  Matthew Lewis  has presented today for surgery, with the diagnosis of GI bleed.  The various methods of treatment have been discussed with the patient and family. After consideration of risks, benefits and other options for treatment, the patient has consented to  Procedure(s): ESOPHAGOGASTRODUODENOSCOPY (EGD) WITH PROPOFOL (N/A) as a surgical intervention.  The patient's history has been reviewed, patient examined, no change in status, stable for surgery.  I have reviewed the patient's chart and labs.  Questions were answered to the patient's satisfaction.     [place]

## 2018-05-14 NOTE — Progress Notes (Signed)
Patient signed consent for EGD today at 2pm. Patient had no further questions for MD. Consent placed in chart.

## 2018-05-15 ENCOUNTER — Encounter (HOSPITAL_COMMUNITY): Payer: Self-pay | Admitting: Gastroenterology

## 2018-05-15 LAB — GLUCOSE, CAPILLARY
Glucose-Capillary: 278 mg/dL — ABNORMAL HIGH (ref 70–99)
Glucose-Capillary: 207 mg/dL — ABNORMAL HIGH (ref 70–99)
Glucose-Capillary: 263 mg/dL — ABNORMAL HIGH (ref 70–99)
Glucose-Capillary: 274 mg/dL — ABNORMAL HIGH (ref 70–99)
Glucose-Capillary: 232 mg/dL — ABNORMAL HIGH (ref 70–99)

## 2018-05-15 LAB — HEMOGLOBIN AND HEMATOCRIT, BLOOD
HCT: 23.6 % — ABNORMAL LOW (ref 39.0–52.0)
HCT: 25.2 % — ABNORMAL LOW (ref 39.0–52.0)
Hemoglobin: 8 g/dL — ABNORMAL LOW (ref 13.0–17.0)
Hemoglobin: 8.2 g/dL — ABNORMAL LOW (ref 13.0–17.0)

## 2018-05-15 LAB — TYPE AND SCREEN
ABO/RH(D): A POS
Antibody Screen: NEGATIVE
Unit division: 0
Unit division: 0
Unit division: 0
Unit division: 0
Unit division: 0
Unit division: 0
Unit division: 0

## 2018-05-15 LAB — BASIC METABOLIC PANEL
Anion gap: 6 (ref 5–15)
BUN: 27 mg/dL — ABNORMAL HIGH (ref 8–23)
CO2: 20 mmol/L — ABNORMAL LOW (ref 22–32)
Calcium: 8 mg/dL — ABNORMAL LOW (ref 8.9–10.3)
Chloride: 106 mmol/L (ref 98–111)
Creatinine, Ser: 0.62 mg/dL (ref 0.61–1.24)
GFR calc Af Amer: >60 mL/min (ref >60)
GFR calc non Af Amer: >60 mL/min (ref >60)
Glucose, Bld: 205 mg/dL — ABNORMAL HIGH (ref 70–99)
Potassium: 4.1 mmol/L (ref 3.5–5.1)
Sodium: 132 mmol/L — ABNORMAL LOW (ref 135–145)

## 2018-05-15 LAB — CBC
HCT: 23 % — ABNORMAL LOW (ref 39.0–52.0)
Hemoglobin: 7.7 g/dL — ABNORMAL LOW (ref 13.0–17.0)
MCH: 31.6 pg (ref 26.0–34.0)
MCHC: 33.5 g/dL (ref 30.0–36.0)
MCV: 94.3 fL (ref 80.0–100.0)
Platelets: 98 10*3/uL — ABNORMAL LOW (ref 150–400)
RBC: 2.44 MIL/uL — ABNORMAL LOW (ref 4.22–5.81)
RDW: 16.6 % — ABNORMAL HIGH (ref 11.5–15.5)
WBC: 28 10*3/uL — ABNORMAL HIGH (ref 4.0–10.5)
nRBC: 0 % (ref 0.0–0.2)

## 2018-05-15 LAB — BPAM RBC
Blood Product Expiration Date: 202005192359
Blood Product Expiration Date: 202005192359
Blood Product Expiration Date: 202005202359
Blood Product Expiration Date: 202005222359
Blood Product Expiration Date: 202005222359
Blood Product Expiration Date: 202005222359
Blood Product Expiration Date: 202005222359
ISSUE DATE / TIME: 202005052145
ISSUE DATE / TIME: 202005052214
ISSUE DATE / TIME: 202005052252
ISSUE DATE / TIME: 202005052335
ISSUE DATE / TIME: 202005060028
ISSUE DATE / TIME: 202005070020
ISSUE DATE / TIME: 202005070306
Unit Type and Rh: 6200
Unit Type and Rh: 6200
Unit Type and Rh: 6200
Unit Type and Rh: 6200
Unit Type and Rh: 6200
Unit Type and Rh: 6200
Unit Type and Rh: 6200

## 2018-05-15 MED ORDER — PANTOPRAZOLE SODIUM 40 MG PO TBEC
40.0000 mg | DELAYED_RELEASE_TABLET | Freq: Every day | ORAL | Status: DC
  Administered 2018-05-15 – 2018-05-17 (×3): 40 mg via ORAL
  Filled 2018-05-15 (×3): qty 1

## 2018-05-15 MED ORDER — LACTATED RINGERS IV SOLN
INTRAVENOUS | Status: DC
  Administered 2018-05-15: 10:00:00 via INTRAVENOUS
  Administered 2018-05-17: 20 mL/h via INTRAVENOUS

## 2018-05-15 NOTE — Progress Notes (Signed)
IRL Matthew Lewis   DOB:1955-09-06   [id number]   BPZ#:[id number]  Oncology f/u note   Subjective: Pt underwent second EGD and banding yesterday, feels much better today, sitting in the chair, eating regular diet.  No bowel movement earlier today, was black.  Blood pressure normalized, off pressors, no other new complaints.  Objective:  Vitals:   05/15/18 1645 05/15/18 1700  BP: 113/66 (!) 113/57  Pulse: 88 (!) 103  Resp: 15 (!) 22  Temp:    SpO2: 100% 96%    Body mass index is 29.18 kg/m.  Intake/Output Summary (Last 24 hours) at 05/15/2018 1714 Last data filed at 05/15/2018 1700 Gross per 24 hour  Intake 2202.43 ml  Output 2050 ml  Net 152.43 ml     Sclerae unicteric  Oropharynx clear  No peripheral adenopathy  Lungs clear -- no rales or rhonchi  Heart regular rate and rhythm  Abdomen soft, (+) two biliary drain tubes   MSK no focal spinal tenderness, no peripheral edema  Neuro nonfocal    CBG (last 3)  Recent Labs    05/15/18 0735 05/15/18 1139 05/15/18 1609  GLUCAP 207* 263* 274*    Urine Studies No results for input(s): UHGB, CRYS in the last 72 hours.  Invalid input(s): UACOL, UAPR, USPG, UPH, UTP, UGL, UKET, UBIL, UNIT, UROB, [place], UEPI, UWBC, [place], [place], [place], [place], [place]  Basic Metabolic Panel: Recent Labs  Lab 05/12/18 0201 05/13/18 0303 05/14/18 0500 05/15/18 0623  NA 132* 137 132* 132*  K 4.5 5.5* 4.6 4.1  CL 103 112* 109 106  CO2 20* 17* 20* 20*  GLUCOSE 172* 240* 236* 205*  BUN 21 41* 29* 27*  CREATININE 0.70 0.69 0.66 0.62  CALCIUM 7.7* 7.3* 7.9* 8.0*  MG  --  1.6* 1.9  --    GFR Estimated Creatinine Clearance: 109.6 mL/min (by C-G formula based on SCr of 0.62 mg/dL). Liver Function Tests: Recent Labs  Lab 05/12/18 0201 05/13/18 0303  AST 44* 30  ALT 35 29  ALKPHOS 206* 148*  BILITOT 1.1 4.6*  PROT 5.9* 4.4*  ALBUMIN 2.3* 2.2*   No results for input(s): LIPASE, AMYLASE in the last 168 hours. No results for input(s): AMMONIA  in the last 168 hours. Coagulation profile Recent Labs  Lab 05/12/18 0201 05/12/18 2113 05/13/18 2357  INR 1.3* 1.7* 1.8*    CBC: Recent Labs  Lab 05/12/18 0201  05/13/18 0303  05/14/18 0500 05/14/18 0936 05/14/18 1745 05/14/18 2205 05/15/18 0623 05/15/18 1109  WBC 46.5*  --  68.9*  --  35.8*  --   --   --  28.0*  --   NEUTROABS 40.5*  --  63.7*  --   --   --   --   --   --   --   HGB 7.7*   < > 8.7*   < > 8.0* 7.8* 8.6* 7.6* 7.7* 8.0*  HCT 24.2*   < > 25.9*   < > 23.5* 23.4* 24.6* 22.7* 23.0* 23.6*  MCV 104.3*  --  92.8  --  91.1  --   --   --  94.3  --   PLT 181  --  173  --  97*  --   --   --  98*  --    < > = values in this interval not displayed.   Cardiac Enzymes: Recent Labs  Lab 05/12/18 0201  TROPONINI <0.03   BNP: Invalid input(s): POCBNP CBG: Recent Labs  Lab  05/14/18 1701 05/14/18 2121 05/15/18 0735 05/15/18 1139 05/15/18 1609  GLUCAP 190* 278* 207* 263* 274*   D-Dimer No results for input(s): DDIMER in the last 72 hours. Hgb A1c No results for input(s): HGBA1C in the last 72 hours. Lipid Profile No results for input(s): CHOL, HDL, LDLCALC, TRIG, CHOLHDL, LDLDIRECT in the last 72 hours. Thyroid function studies No results for input(s): TSH, T4TOTAL, T3FREE, THYROIDAB in the last 72 hours.  Invalid input(s): FREET3 Anemia work up No results for input(s): VITAMINB12, FOLATE, FERRITIN, TIBC, IRON, RETICCTPCT in the last 72 hours. Microbiology Recent Results (from the past 240 hour(s))  Blood culture (routine x 2)     Status: None (Preliminary result)   Collection Time: 05/12/18  2:01 AM  Result Value Ref Range Status   Specimen Description   Final    BLOOD BLOOD LEFT FOREARM Performed at [facility] [address] [place], [place] [address]    Special Requests   Final    BOTTLES DRAWN AEROBIC AND ANAEROBIC Blood Culture adequate volume Performed at [facility] [address], [place], [place]  [address]    Culture   Final    NO GROWTH 3 DAYS Performed at [facility], [place] [address], [place], [place] [address]    Report Status PENDING  Incomplete  SARS Coronavirus 2 (CEPHEID - Performed in [facility] hospital lab), Hosp Order     Status: None   Collection Time: 05/12/18  2:06 AM  Result Value Ref Range Status   SARS Coronavirus 2 NEGATIVE NEGATIVE Final    Comment: (NOTE) If result is NEGATIVE SARS-CoV-2 target nucleic acids are NOT DETECTED. The SARS-CoV-2 RNA is generally detectable in upper and lower  respiratory specimens during the acute phase of infection. The lowest  concentration of SARS-CoV-2 viral copies this assay can detect is 250  copies / mL. A negative result does not preclude SARS-CoV-2 infection  and should not be used as the sole basis for treatment or other  patient management decisions.  A negative result may occur with  improper specimen collection / handling, submission of specimen other  than nasopharyngeal swab, presence of viral mutation(s) within the  areas targeted by this assay, and inadequate number of viral copies  (<250 copies / mL). A negative result must be combined with clinical  observations, patient history, and epidemiological information. If result is POSITIVE SARS-CoV-2 target nucleic acids are DETECTED. The SARS-CoV-2 RNA is generally detectable in upper and lower  respiratory specimens dur ing the acute phase of infection.  Positive  results are indicative of active infection with SARS-CoV-2.  Clinical  correlation with patient history and other diagnostic information is  necessary to determine patient infection status.  Positive results do  not rule out bacterial infection or co-infection with other viruses. If result is PRESUMPTIVE POSTIVE SARS-CoV-2 nucleic acids MAY BE PRESENT.   A presumptive positive result was obtained on the submitted specimen  and confirmed on repeat testing.  While 2019 novel coronavirus   (SARS-CoV-2) nucleic acids may be present in the submitted sample  additional confirmatory testing may be necessary for epidemiological  and / or clinical management purposes  to differentiate between  SARS-CoV-2 and other Sarbecovirus currently known to infect humans.  If clinically indicated additional testing with an alternate test  methodology [phone]) is advised. The SARS-CoV-2 RNA is generally  detectable in upper and lower respiratory sp ecimens during the acute  phase of infection. The expected result is  Negative. Fact Sheet for Patients:  StrictlyIdeas.no Fact Sheet for Healthcare Providers: BankingDealers.co.za This test is not yet approved or cleared by the [place] FDA and has been authorized for detection and/or diagnosis of SARS-CoV-2 by FDA under an Emergency Use Authorization (EUA).  This EUA will remain in effect (meaning this test can be used) for the duration of the COVID-19 declaration under Section 564(b)(1) of the Act, 21 U.S.C. section 360bbb-3(b)(1), unless the authorization is terminated or revoked sooner. Performed at [facility], [place] [address] [place], [place] [address]   Blood culture (routine x 2)     Status: None (Preliminary result)   Collection Time: 05/12/18  2:54 AM  Result Value Ref Range Status   Specimen Description   Final    BLOOD RIGHT HAND Performed at [facility] [address] [place], [place] [address]    Special Requests   Final    BOTTLES DRAWN AEROBIC AND ANAEROBIC Blood Culture adequate volume Performed at [facility] [address] [place], [place] [address]    Culture   Final    NO GROWTH 3 DAYS Performed at [facility], [place] [address], [place], [place] [address]    Report Status PENDING  Incomplete  Urine culture     Status: None   Collection Time: 05/12/18  6:49 AM  Result Value Ref Range Status    Specimen Description   Final    URINE, RANDOM Performed at [facility] [address] [place], [place] [address]    Special Requests   Final    NONE Performed at [facility], [place] [address], [place], [place] [address]    Culture   Final    NO GROWTH Performed at [facility], [place] [address] [place], [place] [address]    Report Status 05/13/2018 FINAL  Final  MRSA PCR Screening     Status: None   Collection Time: 05/12/18  7:38 AM  Result Value Ref Range Status   MRSA by PCR NEGATIVE NEGATIVE Final    Comment:        The GeneXpert MRSA Assay (FDA approved for NASAL specimens only), is one component of a comprehensive MRSA colonization surveillance program. It is not intended to diagnose MRSA infection nor to guide or monitor treatment for MRSA infections. Performed at [facility], [place] [address] [place], [place] [address]       Studies:  No results found.  Assessment: 63 y.o. with metastatic cholangiocarcinoma, on chemotherapy, admitted for hypotension  1.  Severe upper GI bleeding from varices, status post bandingX2 and 7u blood  2. Hypotension and syncope, secondary to dehydration, resolved  3.  Metastatic cholangiocarcinoma, on second line chemo 4. Liver cirrhosis 5. Anemia of blood loss, malignancy and chemo  6.  Obstructive jaundice, with bilary drains 7. Leukocytosis secondary to GCSF and reaction, ? Infection     Plan:   -his GI bleeding seems stopped now, appreciate GI team -close monitoring CBC  -he is DNR, but want to continue medical care  -continue supportive care, will hold chemo for a while, may not restart.  -I will see him in my office within a week after discharge, for lab, f/u and IVF supportive care -His wife works from home, he has good support, will set up home care with IVF at home    [name], MD 05/15/2018

## 2018-05-15 NOTE — Evaluation (Signed)
Physical Therapy Evaluation Patient Details Name: Matthew Lewis MRN: [id number] DOB: 10/09/1955 Today's Date: 05/15/2018   History of Present Illness  Pt admitted dx of hemorragic shock 2* GIB/anemia.  Pt with hx of PTSD, DM, substance abuse, and metastatic cholangiocarcinoma.  Pt has been on bedrest for past 6 days.  Clinical Impression  Pt admitted as above and presenting with functional mobility limitations 2* generalized weakness and ambulatory balance deficits following 6 days bedrest and 7 units of blood transfused.  Pt very motivated and should progress to dc home with family assist.    Follow Up Recommendations No PT follow up    Equipment Recommendations  None recommended by PT    Recommendations for Other Services       Precautions / Restrictions Precautions Precautions: Fall Precaution Comments: Bilary drains in place bilat LEs Restrictions Weight Bearing Restrictions: No      Mobility  Bed Mobility Overal bed mobility: Needs Assistance Bed Mobility: Rolling;Sidelying to Sit Rolling: Min guard Sidelying to sit: Min assist       General bed mobility comments: cues for sequence and min assist to bring trunk to upright posture  Transfers Overall transfer level: Needs assistance Equipment used: Rolling walker (2 wheeled) Transfers: Sit to/from Stand Sit to Stand: Min assist;+2 physical assistance;+2 safety/equipment         General transfer comment: cues for use of UEs to self assist; Steady assist on rising  Ambulation/Gait Ambulation/Gait assistance: Min assist Gait Distance (Feet): 230 Feet Assistive device: Rolling walker (2 wheeled) Gait Pattern/deviations: Step-through pattern;Decreased step length - right;Decreased step length - left;Shuffle;Trunk flexed     General Gait Details: cues for posture and position from RW; no c/o dizziness/nausea  Stairs            Wheelchair Mobility    Modified Rankin (Stroke Patients Only)        Balance Overall balance assessment: Needs assistance Sitting-balance support: No upper extremity supported;Feet supported Sitting balance-Leahy Scale: Good     Standing balance support: Bilateral upper extremity supported Standing balance-Leahy Scale: Poor                               Pertinent Vitals/Pain Pain Assessment: No/denies pain    Home Living Family/patient expects to be discharged to:: Private residence Living Arrangements: Spouse/significant other;Children Available Help at Discharge: Family Type of Home: House Home Access: Stairs to enter Entrance Stairs-Rails: Psychiatric nurse of Steps: 5 on one entrance, 8 on another bilateral rails Home Layout: Able to live on main level with bedroom/bathroom;Multi-level Home Equipment: Walker - 2 wheels      Prior Function Level of Independence: Independent               Hand Dominance        Extremity/Trunk Assessment   Upper Extremity Assessment Upper Extremity Assessment: Generalized weakness    Lower Extremity Assessment Lower Extremity Assessment: Generalized weakness       Communication   Communication: No difficulties  Cognition Arousal/Alertness: Awake/alert Behavior During Therapy: WFL for tasks assessed/performed Overall Cognitive Status: Within Functional Limits for tasks assessed                                        General Comments      Exercises     Assessment/Plan    PT Assessment  Patient needs continued PT services  PT Problem List Decreased strength;Decreased range of motion;Decreased activity tolerance;Decreased balance;Decreased mobility;Decreased knowledge of use of DME;Decreased safety awareness       PT Treatment Interventions DME instruction;Gait training;Stair training;Functional mobility training;Therapeutic activities;Therapeutic exercise;Balance training;Patient/family education    PT Goals (Current goals can be found  in the Care Plan section)  Acute Rehab PT Goals Patient Stated Goal: Regain IND PT Goal Formulation: With patient Time For Goal Achievement: 05/29/18 Potential to Achieve Goals: Good    Frequency Min 3X/week   Barriers to discharge        Co-evaluation               AM-PAC PT "6 Clicks" Mobility  Outcome Measure Help needed turning from your back to your side while in a flat bed without using bedrails?: A Little Help needed moving from lying on your back to sitting on the side of a flat bed without using bedrails?: A Little Help needed moving to and from a bed to a chair (including a wheelchair)?: A Little Help needed standing up from a chair using your arms (e.g., wheelchair or bedside chair)?: A Little Help needed to walk in hospital room?: A Little Help needed climbing 3-5 steps with a railing? : A Lot 6 Click Score: 17    End of Session Equipment Utilized During Treatment: Gait belt Activity Tolerance: Patient tolerated treatment well Patient left: in chair;with call bell/phone within reach;with chair alarm set Nurse Communication: Mobility status PT Visit Diagnosis: Muscle weakness (generalized) (M62.81);Difficulty in walking, not elsewhere classified (R26.2)    Time: 1426-1450 PT Time Calculation (min) (ACUTE ONLY): 24 min   Charges:   PT Evaluation $PT Eval Low Complexity: 1 Low PT Treatments $Gait Training: 8-22 mins        Debe Coder PT Acute Rehabilitation Services Pager [phone] Office [phone]   Aylana Hirschfeld 05/15/2018, 5:25 PM

## 2018-05-15 NOTE — Progress Notes (Signed)
Verbal orders from Noe Gens, NP, to keep MAP 60 or greater.

## 2018-05-15 NOTE — Progress Notes (Addendum)
NAME:  Matthew Lewis, MRN:  [id number], DOB:  1955-06-05, LOS: 3 ADMISSION DATE:  05/12/2018, CONSULTATION DATE:  05/13/18 REFERRING MD:  [facility]  , CHIEF COMPLAINT:  Hypotension   Brief History   63 y/o M with metastatic cholangiocarcinoma, biliary obstruction s/p perc drains admitted 5/5 with syncopal episode, hypotension.  Developed hematemesis with subsequent ABLA.  EGD with concern for variceal bleeding s/p banding.  Required vasopressors for hypotension.   Past Medical History  Metastatic Cholangiocarcinoma  Portal Venous Thrombosis - not on anticoagulation due to GIB Biliary obstruction secondary to malignancy with percutaneous drains Cirrhosis  Substance Abuse - oxycodone, THC DM II  [facility]   5/05 Admit  5/06 PCCM consulted for hypotension  5/07 PRBC x2 units for anemia, dark stool.  Repeat EGD with additional banding  Consults:  GI  PCCM   Procedures:  Port-A-Cath R Chest 12/09/17 >>   Significant Diagnostic Tests:  EGD 5/6 >> Grade II, large (> 5 mm) varices were found in the lower third of the esophagus. Four bands were successfully placed with complete eradication, resulting in deflation of varices. Estimated blood loss was minimal. EGD 5/7 >> esophageal varices with evidence of prior banding, varices remain sizeable with some red markings, 3 additional bands placed with deflation, esophagitis at GEJ, portal hypertensive gastritis  Micro Data:  COVID 5/5 >> negative  MRSA PCR 5/5 >> negative  BCx2 5/5 >>  UC 5/5 >> negative   Antimicrobials:  Meropenem 5/5 >> 5/6 Flagyl 5/5 >> 5/6   Interim history/subjective:  Pt reports feeling much better.  Vasopressin turned off this am.   Objective   Blood pressure (!) 97/56, pulse 72, temperature 97.7 F (36.5 C), temperature source Oral, resp. rate 15, height 5' 11"  (1.803 m), weight 94.9 kg, SpO2 99 %.        Intake/Output Summary (Last 24 hours) at 05/15/2018 9735 Last data filed  at 05/15/2018 0830 Gross per 24 hour  Intake 1726.84 ml  Output 2080 ml  Net -353.16 ml   Filed Weights   05/13/18 0500 05/14/18 0500 05/14/18 1312  Weight: 89.5 kg 94.9 kg 94.9 kg    Examination: General: chronically ill appearing adult male lying in bed in NAD HEENT: MM pink/moist, no jvd Neuro: AAOx4, speech clear, MAE  CV: s1s2 rrr, no m/r/g PULM: even/non-labored, lungs bilaterally clear  GI: soft, non-tender, bsx4 hypoactive, perc biliary drains x2  Extremities: warm/dry, no edema  Skin: no rashes or lesions  Resolved Hospital Problem list     Assessment & Plan:   Hemorrhagic Shock in setting of GIB / Acute Blood Loss Anemia Volume Depletion in setting of High Output Biliary Drains P: Appreciate GI assistance  Monitor for further bleeding  Continue stress dose steroids for now  Goal MAP > 60 with normal mental status + UOP (suspect he runs lower) Follow H/H  Volume resuscitation with PRBC (if needed) DNR   Acute GIB / Hematemesis suspected secondary to Varices -s/p EGD 5/6 with banding of varices Cirrhosis  Metastatic Cholangiocarcinoma  -on second line chemotherapy Biliary Obstruction with Perc Drains -high output in the weeks leading up to admit per ONC, up to 1L out in 24 hours P: Defer PPI / ocetreotide gtt's to GI  Monitor for bleeding  Transfuse per ICU guidelines  ONC input appreciated  Add LR at 20 ml/hr given biliary output   Leukocytosis  -suspect from G-CSF -hx ESBL in 12/2017 P: Monitor WBC trend /  fever curve  Hold abx for now / no indication of acute infectious process   Hypomagnesemia  At Risk AKI  P: Trend BMP / urinary output Replace electrolytes as indicated Avoid nephrotoxic agents, ensure adequate renal perfusion  DM II  Non-severe (moderate) malnutrition in context of chronic illness "P: SSI / nutrition rx as rec   At Risk Aspiration in setting of Emesis P: Aspiration precautions   Chronic Pain  P: Continue home  gabapentin, cymbalta  Avoid narcotics, pt has been clean for 7 years, wants to avoid drug use if possible   Best practice:  Diet: Clear liquids  Pain/Anxiety/Delirium protocol (if indicated): n/a VAP protocol (if indicated): n/a DVT prophylaxis: SCD's  GI prophylaxis: PPI / octreotide gtt  Glucose control: n/a  Mobility: as tolerated  Code Status: DNR  Family Communication: Patient updated on plan of care.  Disposition: SDU. To [facility] as of 5/9 am. Discussed with Dr. Jaynie Bream   Labs   CBC: Recent Labs  Lab 05/12/18 0201  05/13/18 0303  05/14/18 0500 05/14/18 6440 05/14/18 1745 05/14/18 2205 05/15/18 0623  WBC 46.5*  --  68.9*  --  35.8*  --   --   --  28.0*  NEUTROABS 40.5*  --  63.7*  --   --   --   --   --   --   HGB 7.7*   < > 8.7*   < > 8.0* 7.8* 8.6* 7.6* 7.7*  HCT 24.2*   < > 25.9*   < > 23.5* 23.4* 24.6* 22.7* 23.0*  MCV 104.3*  --  92.8  --  91.1  --   --   --  94.3  PLT 181  --  173  --  97*  --   --   --  98*   < > = values in this interval not displayed.    Basic Metabolic Panel: Recent Labs  Lab 05/12/18 0201 05/13/18 0303 05/14/18 0500 05/15/18 0623  NA 132* 137 132* 132*  K 4.5 5.5* 4.6 4.1  CL 103 112* 109 106  CO2 20* 17* 20* 20*  GLUCOSE 172* 240* 236* 205*  BUN 21 41* 29* 27*  CREATININE 0.70 0.69 0.66 0.62  CALCIUM 7.7* 7.3* 7.9* 8.0*  MG  --  1.6* 1.9  --    GFR: Estimated Creatinine Clearance: 109.6 mL/min (by C-G formula based on SCr of 0.62 mg/dL). Recent Labs  Lab 05/12/18 0201 05/12/18 0746 05/13/18 0303 05/13/18 0801 05/14/18 0500 05/15/18 0623  WBC 46.5*  --  68.9*  --  35.8* 28.0*  LATICACIDVEN 3.6* 2.1*  --  2.8*  --   --     Liver Function Tests: Recent Labs  Lab 05/12/18 0201 05/13/18 0303  AST 44* 30  ALT 35 29  ALKPHOS 206* 148*  BILITOT 1.1 4.6*  PROT 5.9* 4.4*  ALBUMIN 2.3* 2.2*   No results for input(s): LIPASE, AMYLASE in the last 168 hours. No results for input(s): AMMONIA in the last 168 hours.  ABG     Component Value Date/Time   PHART 7.416 10/18/2008 2105   PCO2ART 41.6 10/18/2008 2105   PO2ART 216.0 (H) 10/18/2008 2105   HCO3 15.6 (L) 02/24/2018 0309   TCO2 16 (L) 02/24/2018 0309   ACIDBASEDEF 9.0 (H) 02/24/2018 0309   O2SAT 99.0 02/24/2018 0309     Coagulation Profile: Recent Labs  Lab 05/12/18 0201 05/12/18 2113 05/13/18 2357  INR 1.3* 1.7* 1.8*    Cardiac Enzymes: Recent Labs  Lab 05/12/18 0201  TROPONINI <0.03    HbA1C: Hgb A1c MFr Bld  Date/Time Value Ref Range Status  11/09/2017 07:52 AM 4.9 4.8 - 5.6 % Final    Comment:    (NOTE) Pre diabetes:          5.7%-6.4% Diabetes:              >6.4% Glycemic control for   <7.0% adults with diabetes   10/27/2017 02:49 AM 6.5 (H) 4.8 - 5.6 % Final    Comment:    (NOTE)         Prediabetes: 5.7 - 6.4         Diabetes: >6.4         Glycemic control for adults with diabetes: <7.0     CBG: Recent Labs  Lab 05/13/18 1602 05/13/18 2216 05/14/18 0733 05/14/18 1150 05/14/18 1701  GLUCAP 341* 298* 210* 193* 190*    Critical care time: 30 minutes     Matthew Gens, NP-C [facility] Pgr: [phone] or if no answer [phone] 05/15/2018, 9:29 AM

## 2018-05-15 NOTE — Progress Notes (Addendum)
Patient ID: Matthew Lewis, male   DOB: Nov 15, 1955, 63 y.o.   MRN: [id number]    Progress Note   Subjective  Feeling "much better", happy to be able to eat a bit more, no c/o pain, nausea- one stool last night -black per pt , none today so far. hgb 7.7- stable EGD yesterday - 3 additional bands placed to distal varices,portal  gastropathy noted,a possible gastric varix in cardia, nonbleeding  Pressors off- BP  soft but stable    Objective   Vital signs in last 24 hours: Temp:  [97.4 F (36.3 C)-98.1 F (36.7 C)] 97.7 F (36.5 C) (05/08 0802) Pulse Rate:  [70-97] 72 (05/08 0830) Resp:  [11-24] 15 (05/08 0830) BP: (83-128)/(49-82) 97/56 (05/08 0830) SpO2:  [96 %-100 %] 99 % (05/08 0830) Weight:  [94.9 kg] 94.9 kg (05/07 1312) Last BM Date: 05/13/18 General:  Older   white male in NAD,color better Heart:  Regular rate and rhythm; no murmurs Lungs: Respirations even and unlabored, lungs CTA bilaterally Abdomen:  Soft, nontender and nondistended. Normal bowel sounds.BIliary drains in place Extremities:  Without edema. Neurologic:  Alert and oriented,  grossly normal neurologically. Psych:  Cooperative. Normal mood and affect.  Intake/Output from previous day: 05/07 0701 - 05/08 0700 In: 1764.6 [P.O.:240; I.V.:1459.8; IV Piggyback:64.8] Out: 1905 [Urine:1800; Drains:100; Blood:5] Intake/Output this shift: Total I/O In: 65.2 [I.V.:65.2] Out: 725 [Urine:650; Drains:125]  Lab Results: Recent Labs    05/13/18 0303  05/14/18 0500  05/14/18 1745 05/14/18 2205 05/15/18 0623  WBC 68.9*  --  35.8*  --   --   --  28.0*  HGB 8.7*   < > 8.0*   < > 8.6* 7.6* 7.7*  HCT 25.9*   < > 23.5*   < > 24.6* 22.7* 23.0*  PLT 173  --  97*  --   --   --  98*   < > = values in this interval not displayed.   BMET Recent Labs    05/13/18 0303 05/14/18 0500 05/15/18 0623  NA 137 132* 132*  K 5.5* 4.6 4.1  CL 112* 109 106  CO2 17* 20* 20*  GLUCOSE 240* 236* 205*  BUN 41* 29* 27*   CREATININE 0.69 0.66 0.62  CALCIUM 7.3* 7.9* 8.0*   LFT Recent Labs    05/13/18 0303  PROT 4.4*  ALBUMIN 2.2*  AST 30  ALT 29  ALKPHOS 148*  BILITOT 4.6*   PT/INR Recent Labs    05/12/18 2113 05/13/18 2357  LABPROT 19.7* 20.8*  INR 1.7* 1.8*        Assessment / Plan:    #73  63 year old white male with metastatic cholangiocarcinoma with biliary obstruction and indwelling percutaneous drains, underlying decompensated cirrhosis who was admitted with hemorrhagic shock secondary to acute upper GI bleed  Bleeding is secondary to large distal esophageal varices he also has diffuse portal gastropathy and there is a possible gastric varix in the cardia which was without any stigmata of bleeding. Patient has had 2 EGDs with banding of the distal esophageal varices this admission.  He is stable and feeling better, no active bleeding since EGD yesterday, and hemoglobin stable overnight at 7.7 #2  Leukocytosis-secondary to granulocyte stimulating factor #3 history of duodenal ulcer with bleed February 2020 #4 portal vein thrombosis  Plan; advance to soft diet today Serial hemoglobins and transfuse to keep hemoglobin 7 Will discontinue octreotide after today Stop PPI infusion and will switch to Protonix 40 mg p.o. orally  Patient  will need repeat EGD with Dr. Silverio Decamp  in about 4 weeks for further banding. We will arrange this will also need careful monitoring of his hemoglobin after discharge can be done through oncology who is also following.  GI will sign off certainly available for any issues over the weekend, please call   LOS: 3 days   Amy EsterwoodPA-C  05/15/2018, 8:36 AM  GI ATTENDING  Interval history and data reviewed. Agree with comprehensive interval progress note and recommendations with followup as outlined. As above, we are available as needed.  Docia Chuck. Geri Seminole., M.D. [facility] Division of Gastroenterology

## 2018-05-16 LAB — GLUCOSE, CAPILLARY
Glucose-Capillary: 167 mg/dL — ABNORMAL HIGH (ref 70–99)
Glucose-Capillary: 284 mg/dL — ABNORMAL HIGH (ref 70–99)
Glucose-Capillary: 234 mg/dL — ABNORMAL HIGH (ref 70–99)
Glucose-Capillary: 254 mg/dL — ABNORMAL HIGH (ref 70–99)
Glucose-Capillary: 193 mg/dL — ABNORMAL HIGH (ref 70–99)

## 2018-05-16 LAB — CBC
HCT: 24.8 % — ABNORMAL LOW (ref 39.0–52.0)
Hemoglobin: 8.2 g/dL — ABNORMAL LOW (ref 13.0–17.0)
MCH: 32 pg (ref 26.0–34.0)
MCHC: 33.1 g/dL (ref 30.0–36.0)
MCV: 96.9 fL (ref 80.0–100.0)
Platelets: 113 10*3/uL — ABNORMAL LOW (ref 150–400)
RBC: 2.56 MIL/uL — ABNORMAL LOW (ref 4.22–5.81)
RDW: 17.6 % — ABNORMAL HIGH (ref 11.5–15.5)
WBC: 29.5 10*3/uL — ABNORMAL HIGH (ref 4.0–10.5)
nRBC: 0.1 % (ref 0.0–0.2)

## 2018-05-16 LAB — BASIC METABOLIC PANEL
Anion gap: 9 (ref 5–15)
BUN: 20 mg/dL (ref 8–23)
CO2: 23 mmol/L (ref 22–32)
Calcium: 8.1 mg/dL — ABNORMAL LOW (ref 8.9–10.3)
Chloride: 104 mmol/L (ref 98–111)
Creatinine, Ser: 0.63 mg/dL (ref 0.61–1.24)
GFR calc Af Amer: >60 mL/min (ref >60)
GFR calc non Af Amer: >60 mL/min (ref >60)
Glucose, Bld: 208 mg/dL — ABNORMAL HIGH (ref 70–99)
Potassium: 4.1 mmol/L (ref 3.5–5.1)
Sodium: 136 mmol/L (ref 135–145)

## 2018-05-16 LAB — HEMOGLOBIN AND HEMATOCRIT, BLOOD
HCT: 27.5 % — ABNORMAL LOW (ref 39.0–52.0)
Hemoglobin: 9 g/dL — ABNORMAL LOW (ref 13.0–17.0)

## 2018-05-16 MED ORDER — HYDROCORTISONE NA SUCCINATE PF 100 MG IJ SOLR
50.0000 mg | Freq: Two times a day (BID) | INTRAMUSCULAR | Status: DC
  Administered 2018-05-16 – 2018-05-17 (×2): 50 mg via INTRAVENOUS
  Filled 2018-05-16 (×2): qty 2

## 2018-05-16 NOTE — Progress Notes (Signed)
Physical Therapy Treatment Patient Details Name: NICKLOS GAXIOLA MRN: [id number] DOB: [birth year] Today's Date: 05/16/2018    History of Present Illness Pt admitted dx of hemorragic shock 2* GIB/anemia.  Pt with hx of PTSD, DM, substance abuse, and metastatic cholangiocarcinoma.  Pt has been on bedrest for past 6 days.    PT Comments    Pt very motivated and progressing well including improved endurance and stability with ambulation.   Follow Up Recommendations  No PT follow up     Equipment Recommendations  None recommended by PT    Recommendations for Other Services       Precautions / Restrictions Precautions Precautions: Fall Precaution Comments: Bilary drains in place bilat LEs Restrictions Weight Bearing Restrictions: No    Mobility  Bed Mobility Overal bed mobility: Needs Assistance Bed Mobility: Rolling;Sidelying to Sit Rolling: Min guard Sidelying to sit: Min assist       General bed mobility comments: cues for sequence and min assist to bring trunk to upright posture  Transfers Overall transfer level: Needs assistance Equipment used: Rolling walker (2 wheeled) Transfers: Sit to/from Stand Sit to Stand: Min guard         General transfer comment: cues for use of UEs to self assist; Steady assist on rising  Ambulation/Gait Ambulation/Gait assistance: Min assist;Min guard Gait Distance (Feet): 430 Feet Assistive device: Rolling walker (2 wheeled) Gait Pattern/deviations: Step-through pattern;Decreased step length - right;Decreased step length - left;Shuffle;Trunk flexed     General Gait Details: cues for posture and position from RW; no c/o dizziness/nausea   Stairs             Wheelchair Mobility    Modified Rankin (Stroke Patients Only)       Balance Overall balance assessment: Needs assistance Sitting-balance support: No upper extremity supported;Feet supported Sitting balance-Leahy Scale: Good     Standing balance support:  Bilateral upper extremity supported Standing balance-Leahy Scale: Fair                              Cognition Arousal/Alertness: Awake/alert Behavior During Therapy: WFL for tasks assessed/performed Overall Cognitive Status: Within Functional Limits for tasks assessed                                        Exercises      General Comments        Pertinent Vitals/Pain Pain Assessment: 0-10    Home Living                      Prior Function            PT Goals (current goals can now be found in the care plan section) Acute Rehab PT Goals Patient Stated Goal: Regain IND PT Goal Formulation: With patient Time For Goal Achievement: 05/29/18 Potential to Achieve Goals: Good Progress towards PT goals: Progressing toward goals    Frequency    Min 3X/week      PT Plan Current plan remains appropriate    Co-evaluation              AM-PAC PT "6 Clicks" Mobility   Outcome Measure  Help needed turning from your back to your side while in a flat bed without using bedrails?: A Little Help needed moving from lying on your back to sitting on the side  of a flat bed without using bedrails?: A Little Help needed moving to and from a bed to a chair (including a wheelchair)?: A Little Help needed standing up from a chair using your arms (e.g., wheelchair or bedside chair)?: A Little Help needed to walk in hospital room?: A Little Help needed climbing 3-5 steps with a railing? : A Lot 6 Click Score: 17    End of Session Equipment Utilized During Treatment: Gait belt Activity Tolerance: Patient tolerated treatment well Patient left: in chair;with call bell/phone within reach;with chair alarm set Nurse Communication: Mobility status PT Visit Diagnosis: Muscle weakness (generalized) (M62.81);Difficulty in walking, not elsewhere classified (R26.2)     Time: 4604-7998 PT Time Calculation (min) (ACUTE ONLY): 21 min  Charges:  $Gait  Training: 8-22 mins                     [place] Pager [phone] Office [phone]    [place] 05/16/2018, 12:38 PM

## 2018-05-16 NOTE — Progress Notes (Signed)
PROGRESS NOTE    Matthew Lewis  NOB:[id number] DOB: 11/11/1955 DOA: 05/12/2018 PCP: [name], MD   Brief Narrative: Patient is a 63 year old male with history of metastatic cholangiocarcinoma, portal vein thrombosis , diabetes type 2 ,biliary obstruction status post percutaneous drains who was admitted on 05/12/2018 with syncopal episode, hypertension.  Patient developed hematemesis with acute blood loss anemia.  He underwent PRBC transfusions for anemia.  EGD showed variceal bleeding and he underwent banding procedure.  Underwent repeat EGD with additional bandings.  He was transferred to [facility] service because he required vasopressors for blood pressure support. Currently hemodynamically stable.  Patient transferred to [facility] service on 05/16/18.  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  4 bands were placed.  Underwent repeat EGD on 5/7.  3 additional banding's placed.  Found to have esophagitis and portal hypertensive gastritis.  GI was following.  Started on ceftriaxone for prophylaxis. Upper GI bleed secondary to cirrhosis, portal hypertension  Diet advanced to soft.  Octreotide discontinued.  Continue PPI 40 mg daily. Plan for repeat EGD with Dr. Joylene Igo in 4 weeks.  GI will arrange the follow-up.  Hemorrhagic shock in the setting of GI bleed: Currently blood pressure stable.  He needed pressor support and was under PCCM service.  Currently off pressors.  Goal MAP more than 60.   Monitor for further bleeding.  He was also started on hydrocortisone for persistent hypotension.  We will continue to taper it.  Cirrhosis/metastatic cholangiocarcinoma with biliary obstruction: On second line chemotherapy.  Oncology following.  Biliary obstruction: Has 2 percutaneous drains.  Leukocytosis: Suspected to be from Granix.  Continue to monitor.  Afebrile.  No indication for antibiotics other than ceftriaxone now.  Hypomagnesemia: Supplemented.  Diabetes type 2: On sliding scale insulin.  Pain syndrome: On home gabapentin and Cymbalta.  Avoid narcotics.  Deconditioning/debility: Patient seen by physical therapy.  No follow-up recommended.  Nutrition Problem: Moderate Malnutrition Etiology: chronic illness, catabolic illness, cancer and cancer related treatments      DVT prophylaxis:SCD Code Status: DNR Family Communication: None present at the bedside Disposition Plan: Home in 1 to 2 days   Consultants: GI, PCCM, oncology  Procedures: EGD x2  Antimicrobials:  Anti-infectives (From admission, onward)   Start     Dose/Rate Route Frequency Ordered Stop   05/13/18 1400  cefTRIAXone (ROCEPHIN) 2 g in sodium chloride 0.9 % 100 mL IVPB     2 g 200 mL/hr over 30 Minutes Intravenous Every 24 hours 05/13/18 1223     05/12/18 1400  metroNIDAZOLE (FLAGYL) IVPB 500 mg  Status:  Discontinued     500 mg 100 mL/hr over 60 Minutes Intravenous Every 8 hours 05/12/18 0451 05/13/18 1027   05/12/18 0430  meropenem (MERREM) 1 g in sodium chloride 0.9 % 100 mL IVPB  Status:  Discontinued     1 g 200 mL/hr over 30 Minutes Intravenous Every 8 hours 05/12/18 0415 05/13/18 1027   05/12/18 0415  vancomycin (VANCOCIN) 1,500 mg in sodium chloride 0.9 % 500 mL IVPB     1,500 mg 250 mL/hr over 120 Minutes Intravenous  Once 05/12/18 0415 05/12/18 0800   05/12/18 0330  metroNIDAZOLE (FLAGYL) IVPB 500 mg     500 mg 100 mL/hr over 60 Minutes Intravenous  Once 05/12/18 0320 05/12/18 0524       Subjective: Patient seen and examined the bedside this afternoon.  Hemodynamically stable.  Comfortable.  No hematemesis or melena at present.  Denies any abdominal pain, nausea or vomiting.  Objective: Vitals:   05/16/18 0900 05/16/18 1000 05/16/18 1100 05/16/18 1236  BP: 116/74 104/65 122/77   Pulse: 77 (!) 106 97   Resp: 13 (!) 22 18   Temp:    97.7 F (36.5 C)  TempSrc:    Oral  SpO2: 100% 100% 100%   Weight:      Height:        Intake/Output Summary (Last 24 hours) at 05/16/2018 1355 Last data filed at 05/16/2018 1054 Gross per 24 hour  Intake 1011.21 ml  Output 2125 ml  Net -1113.79 ml   Filed Weights   05/13/18 0500 05/14/18 0500 05/14/18 1312  Weight: 89.5 kg 94.9 kg 94.9 kg    Examination:  General exam: Weak, chronically ill looking.  Not in acute distress.   HEENT:PERRL,Oral mucosa moist, Ear/Nose normal on gross exam Respiratory system: Bilateral equal air entry, normal vesicular breath sounds, no wheezes or crackles  Cardiovascular system: S1 & S2 heard, RRR. No JVD, murmurs, rubs, gallops or clicks. 2+ pedal edema. Gastrointestinal system: Abdomen is distended,ascitic, soft and nontender. No organomegaly or masses felt. Normal bowel sounds heard. 2 percutaneous biliary drains. Central nervous system: Alert and oriented. No focal neurological deficits. Extremities: 2+pedal edema , no clubbing ,no cyanosis, distal peripheral pulses palpable. Skin: No rashes, lesions or ulcers,no icterus ,no pallor MSK: Normal muscle bulk,tone ,power Psychiatry: Judgement and insight appear normal. Mood & affect appropriate.     Data Reviewed: I have personally reviewed following labs and imaging studies  CBC: Recent Labs  Lab 05/12/18 0201  05/13/18 0303  05/14/18 0500  05/15/18 0623 05/15/18 1109 05/15/18 2002 05/16/18 0605 05/16/18 1030  WBC 46.5*  --  68.9*  --  35.8*  --  28.0*  --   --  29.5*  --   NEUTROABS 40.5*  --  63.7*  --   --   --   --   --   --    --   --   HGB 7.7*   < > 8.7*   < > 8.0*   < > 7.7* 8.0* 8.2* 8.2* 9.0*  HCT 24.2*   < > 25.9*   < > 23.5*   < > 23.0* 23.6* 25.2* 24.8* 27.5*  MCV 104.3*  --  92.8  --  91.1  --  94.3  --   --  96.9  --   PLT 181  --  173  --  97*  --  98*  --   --  113*  --    < > = values in this interval not displayed.   Basic Metabolic Panel: Recent Labs  Lab 05/12/18 0201 05/13/18 0303 05/14/18 0500 05/15/18 0623 05/16/18 0605  NA 132* 137 132* 132* 136  K 4.5 5.5* 4.6 4.1 4.1  CL 103 112* 109 106 104  CO2 20* 17* 20* 20* 23  GLUCOSE 172* 240* 236* 205* 208*  BUN 21 41* 29* 27* 20  CREATININE 0.70 0.69 0.66 0.62 0.63  CALCIUM 7.7* 7.3* 7.9* 8.0* 8.1*  MG  --  1.6* 1.9  --   --    GFR: Estimated Creatinine Clearance: 109.6 mL/min (by C-G formula based on SCr of 0.63 mg/dL). Liver Function Tests: Recent Labs  Lab 05/12/18 0201 05/13/18 0303  AST 44* 30  ALT 35 29  ALKPHOS 206* 148*  BILITOT 1.1 4.6*  PROT 5.9* 4.4*  ALBUMIN 2.3* 2.2*   No results for input(s): LIPASE, AMYLASE in the last 168 hours. No results for input(s): AMMONIA in the last 168 hours. Coagulation Profile: Recent Labs  Lab 05/12/18 0201 05/12/18 2113 05/13/18 2357  INR 1.3* 1.7* 1.8*   Cardiac Enzymes: Recent Labs  Lab 05/12/18 0201  TROPONINI <0.03   BNP (last 3 results) No results for input(s): PROBNP in the last 8760 hours. HbA1C: No results for input(s): HGBA1C in the last 72 hours. CBG: Recent Labs  Lab 05/15/18 1139 05/15/18 1609 05/15/18 2123 05/16/18 0746 05/16/18 1147  GLUCAP 263* 274* 232* 167* 284*   Lipid Profile: No results for input(s): CHOL, HDL, LDLCALC, TRIG, CHOLHDL, LDLDIRECT in the last 72 hours. Thyroid Function Tests: No results for input(s): TSH, T4TOTAL, FREET4, T3FREE, THYROIDAB in the last 72 hours. Anemia Panel: No results for input(s): VITAMINB12, FOLATE, FERRITIN, TIBC, IRON, RETICCTPCT in the last 72 hours. Sepsis Labs: Recent Labs  Lab 05/12/18 0201  05/12/18 0746 05/13/18 0801  LATICACIDVEN 3.6* 2.1* 2.8*    Recent Results (from the past 240 hour(s))  Blood culture (routine x 2)     Status: None (Preliminary result)   Collection Time: 05/12/18  2:01 AM  Result Value Ref Range Status   Specimen Description   Final    BLOOD BLOOD LEFT FOREARM Performed at [facility] [address], [place], [place] [address]    Special Requests   Final    BOTTLES DRAWN AEROBIC AND ANAEROBIC Blood Culture adequate volume Performed at [facility] [address] [place], [place] [address]    Culture   Final    NO GROWTH 3 DAYS Performed at [facility], [place] [address] [place], [place] [address]    Report Status PENDING  Incomplete  SARS Coronavirus 2 (CEPHEID - Performed in [place] hospital lab), Hosp Order     Status: None   Collection Time: 05/12/18  2:06 AM  Result Value Ref Range Status   SARS Coronavirus 2 NEGATIVE NEGATIVE Final    Comment: (NOTE) If result is NEGATIVE SARS-CoV-2 target nucleic acids are NOT DETECTED. The SARS-CoV-2 RNA is generally detectable in upper and lower  respiratory specimens during the acute phase of infection. The lowest  concentration of SARS-CoV-2 viral copies this assay can detect is 250  copies / mL. A negative result does not preclude SARS-CoV-2 infection  and should not be used as the sole basis for treatment or other  patient management decisions.  A negative result may occur with  improper specimen collection / handling, submission of specimen other  than nasopharyngeal swab, presence of viral mutation(s) within the  areas targeted by this assay, and inadequate number of viral copies  (<250 copies / mL). A negative result must be combined with clinical  observations, patient history, and epidemiological information. If result is POSITIVE SARS-CoV-2 target nucleic acids are DETECTED. The SARS-CoV-2 RNA is generally detectable in upper and lower   respiratory specimens dur ing the acute phase of infection.  Positive  results are  indicative of active infection with SARS-CoV-2.  Clinical  correlation with patient history and other diagnostic information is  necessary to determine patient infection status.  Positive results do  not rule out bacterial infection or co-infection with other viruses. If result is PRESUMPTIVE POSTIVE SARS-CoV-2 nucleic acids MAY BE PRESENT.   A presumptive positive result was obtained on the submitted specimen  and confirmed on repeat testing.  While 2019 novel coronavirus  (SARS-CoV-2) nucleic acids may be present in the submitted sample  additional confirmatory testing may be necessary for epidemiological  and / or clinical management purposes  to differentiate between  SARS-CoV-2 and other Sarbecovirus currently known to infect humans.  If clinically indicated additional testing with an alternate test  methodology [phone]) is advised. The SARS-CoV-2 RNA is generally  detectable in upper and lower respiratory sp ecimens during the acute  phase of infection. The expected result is Negative. Fact Sheet for Patients:  StrictlyIdeas.no Fact Sheet for Healthcare Providers: BankingDealers.co.za This test is not yet approved or cleared by the [place] FDA and has been authorized for detection and/or diagnosis of SARS-CoV-2 by FDA under an Emergency Use Authorization (EUA).  This EUA will remain in effect (meaning this test can be used) for the duration of the COVID-19 declaration under Section 564(b)(1) of the Act, 21 U.S.C. section 360bbb-3(b)(1), unless the authorization is terminated or revoked sooner. Performed at [facility], [place] [address] [place], [place] [address]   Blood culture (routine x 2)     Status: None (Preliminary result)   Collection Time: 05/12/18  2:54 AM  Result Value Ref Range Status   Specimen Description    Final    BLOOD RIGHT HAND Performed at [facility] [address] [place], [place] [address]    Special Requests   Final    BOTTLES DRAWN AEROBIC AND ANAEROBIC Blood Culture adequate volume Performed at [facility] [address] [place], [place] [address]    Culture   Final    NO GROWTH 3 DAYS Performed at [facility], [place] [address], [place], [place] [address]    Report Status PENDING  Incomplete  Urine culture     Status: None   Collection Time: 05/12/18  6:49 AM  Result Value Ref Range Status   Specimen Description   Final    URINE, RANDOM Performed at [facility] [address] [place], [place] [address]    Special Requests   Final    NONE Performed at [facility], [place] [address], [place], [place] [address]    Culture   Final    NO GROWTH Performed at [facility], [place] [address], [place], [place] [address]    Report Status 05/13/2018 FINAL  Final  MRSA PCR Screening     Status: None   Collection Time: 05/12/18  7:38 AM  Result Value Ref Range Status   MRSA by PCR NEGATIVE NEGATIVE Final    Comment:        The GeneXpert MRSA Assay (FDA approved for NASAL specimens only), is one component of a comprehensive MRSA colonization surveillance program. It is not intended to diagnose MRSA infection nor to guide or monitor treatment for MRSA infections. Performed at [facility], [place] [address], [place], [place] [address]          Radiology Studies: No results found.      Scheduled Meds: . Chlorhexidine Gluconate Cloth  6 each Topical Daily  . DULoxetine  30 mg Oral BID  . feeding supplement  1 Container Oral BID BM  . feeding supplement (ENSURE ENLIVE)  237 mL Oral BID BM  . gabapentin  400 mg Oral Daily  . gabapentin  800 mg Oral QHS  . hydrocortisone sod succinate (SOLU-CORTEF) inj  50 mg Intravenous Q6H  . insulin aspart  0-5 Units  Subcutaneous QHS  . insulin aspart  0-9 Units Subcutaneous TID WC  . lidocaine  1 patch Transdermal Q24H  . loratadine  10 mg Oral Daily  . multivitamin with minerals  1 tablet Oral Daily  . pantoprazole  40 mg Oral Q0600  . sodium chloride flush  10-40 mL Intracatheter Q12H  . sodium chloride flush  3 mL Intravenous Q12H  . sodium chloride flush  3 mL Intravenous Q12H  . tamsulosin  0.4 mg Oral QPC supper   Continuous Infusions: . sodium chloride 0 mL/hr at 05/12/18 2302  . cefTRIAXone (ROCEPHIN)  IV Stopped (05/15/18 1410)  . lactated ringers 20 mL/hr at 05/16/18 0500  . norepinephrine (LEVOPHED) Adult infusion Stopped (05/15/18 0829)  . octreotide  (SANDOSTATIN)    IV infusion 50 mcg/hr (05/16/18 1111)  . vasopressin (PITRESSIN) infusion - *FOR SHOCK* Stopped (05/15/18 0828)     LOS: 4 days    Time spent: 35 mins.More than 50% of that time was spent in counseling and/or coordination of care.      Shelly Coss, MD [facility] Pager [phone]  If 7PM-7AM, please contact night-coverage [email or web address] Password [email or web address] 05/16/2018, 1:55 PM

## 2018-05-17 LAB — CBC WITH DIFFERENTIAL/PLATELET
Abs Immature Granulocytes: 0.65 10*3/uL — ABNORMAL HIGH (ref 0.00–0.07)
Basophils Absolute: 0.1 10*3/uL (ref 0.0–0.1)
Basophils Relative: 0 %
Eosinophils Absolute: 0 10*3/uL (ref 0.0–0.5)
Eosinophils Relative: 0 %
HCT: 27.3 % — ABNORMAL LOW (ref 39.0–52.0)
Hemoglobin: 8.8 g/dL — ABNORMAL LOW (ref 13.0–17.0)
Immature Granulocytes: 2 %
Lymphocytes Relative: 3 %
Lymphs Abs: 1.1 10*3/uL (ref 0.7–4.0)
MCH: 31.7 pg (ref 26.0–34.0)
MCHC: 32.2 g/dL (ref 30.0–36.0)
MCV: 98.2 fL (ref 80.0–100.0)
Monocytes Absolute: 2.1 10*3/uL — ABNORMAL HIGH (ref 0.1–1.0)
Monocytes Relative: 6 %
Neutro Abs: 30.2 10*3/uL — ABNORMAL HIGH (ref 1.7–7.7)
Neutrophils Relative %: 89 %
Platelets: 129 10*3/uL — ABNORMAL LOW (ref 150–400)
RBC: 2.78 MIL/uL — ABNORMAL LOW (ref 4.22–5.81)
RDW: 19.2 % — ABNORMAL HIGH (ref 11.5–15.5)
WBC: 34.1 10*3/uL — ABNORMAL HIGH (ref 4.0–10.5)
nRBC: 0 % (ref 0.0–0.2)

## 2018-05-17 LAB — GLUCOSE, CAPILLARY
Glucose-Capillary: 179 mg/dL — ABNORMAL HIGH (ref 70–99)
Glucose-Capillary: 165 mg/dL — ABNORMAL HIGH (ref 70–99)

## 2018-05-17 LAB — CULTURE, BLOOD (ROUTINE X 2)
Culture: NO GROWTH
Culture: NO GROWTH
Special Requests: ADEQUATE
Special Requests: ADEQUATE

## 2018-05-17 MED ORDER — LIDOCAINE 5 % EX PTCH: 1.0000 | MEDICATED_PATCH | CUTANEOUS | 0 refills | Status: AC

## 2018-05-17 MED ORDER — HEPARIN SOD (PORK) LOCK FLUSH 100 UNIT/ML IV SOLN
500.0000 [IU] | INTRAVENOUS | Status: AC | PRN
  Administered 2018-05-17: 500 [IU]

## 2018-05-17 MED ORDER — PANTOPRAZOLE SODIUM 40 MG PO TBEC: 40.0000 mg | DELAYED_RELEASE_TABLET | Freq: Every day | ORAL | 0 refills | Status: DC

## 2018-05-17 MED ORDER — POLYETHYLENE GLYCOL 3350 17 G PO PACK: 17.0000 g | PACK | Freq: Every day | ORAL | 0 refills | Status: DC | PRN

## 2018-05-17 NOTE — TOC Initial Note (Addendum)
Transition of Care [facility]) - Initial/Assessment Note    Patient Details  Name: Matthew Lewis MRN: [id number] Date of Birth: [birth year]  Transition of Care [facility]) CM/SW Contact:    Erenest Rasher, RN Phone Number: 05/17/2018, 11:22 AM  Clinical Narrative:                 Explained to patient the importance of follow up with his PCP post hospital dc. States he will call Monday to arrange follow up appt. Has RW at home. Wife and son at home to assist with his care. [facility] referral sent.  Expected Discharge Plan: Home/Self Care Barriers to Discharge: No Barriers Identified   Patient Goals and CMS Choice        Expected Discharge Plan and Services Expected Discharge Plan: Home/Self Care In-house Referral: NA Discharge Planning Services: CM Consult Post Acute Care Choice: NA Living arrangements for the past 2 months: Single Family Home Expected Discharge Date: 05/17/18               DME Arranged: N/A DME Agency: NA       HH Arranged: NA          Prior Living Arrangements/Services Living arrangements for the past 2 months: Single Family Home Lives with:: Spouse, Adult Children Patient language and need for interpreter reviewed:: Yes Do you feel safe going back to the place where you live?: Yes      Need for Family Participation in Patient Care: No (Comment) Care giver support system in place?: No (comment) Current home services: DME(rolling walker, cane) Criminal Activity/Legal Involvement Pertinent to Current Situation/Hospitalization: No - Comment as needed  Activities of Daily Living Home Assistive Devices/Equipment: Walker (specify type), Eyeglasses, Other (Comment)(Right and left biliary drains, front wheeled walker) ADL Screening (condition at time of admission) Patient's cognitive ability adequate to safely complete daily activities?: No Is the patient deaf or have difficulty hearing?: No Does the patient have difficulty seeing, even when wearing  glasses/contacts?: No Does the patient have difficulty concentrating, remembering, or making decisions?: No Patient able to express need for assistance with ADLs?: Yes Does the patient have difficulty dressing or bathing?: Yes Independently performs ADLs?: Yes (appropriate for developmental age) Does the patient have difficulty walking or climbing stairs?: No Weakness of Legs: None Weakness of Arms/Hands: None  Permission Sought/Granted Permission sought to share information with : Case Manager, Family Supports Permission granted to share information with : Yes, Verbal Permission Granted  Share Information with NAME: Kian Ottaviano     Permission granted to share info w Relationship: wife  Permission granted to share info w Contact Information: [phone]  Emotional Assessment Appearance:: Appears stated age Attitude/Demeanor/Rapport: Engaged Affect (typically observed): Accepting Orientation: : Oriented to Self, Oriented to Place, Oriented to  Time, Oriented to Situation   Psych Involvement: No (comment)  Admission diagnosis:  Other specified hypotension [I95.89] Syncope and collapse [R55] Patient Active Problem List   Diagnosis Date Noted  . Idiopathic esophageal varices with bleeding (HCC)   . SIRS (systemic inflammatory response syndrome) ([place]) 05/12/2018  . Macrocytic anemia 05/12/2018  . Syncope and collapse 05/12/2018  . Hypovolemic shock ([place])   . Upper GI bleed 02/24/2018  . Lactic acidosis 02/24/2018  . Acute blood loss anemia 02/24/2018  . Port-A-Cath in place 01/28/2018  . Dehydration 12/22/2017  . Bladder outlet obstruction 12/22/2017  . Constipation 12/22/2017  . Bladder outflow obstruction   . Sepsis due to gram-negative bacteria ([place])   . Severe sepsis with acute  organ dysfunction due to Gram negative bacteria ([place]) 12/03/2017  . Septic shock ([place])   . Cholangiocarcinoma ([place])   . Hypotension 11/20/2017  . Acute renal failure (ARF) ([place]) 11/20/2017  .  Palliative care by specialist   . Segmental colitis ([place]) 11/09/2017  . Elevated lipase 11/09/2017  . Intrahepatic cholangiocarcinoma ([place]) 11/06/2017  . Goals of care, counseling/discussion 11/06/2017  . Cirrhosis ([place])   . Malignant neoplasm metastatic to adrenal gland ([place])   . Biliary obstruction due to malignant neoplasm ([place]) 10/23/2017  . Transaminitis 10/23/2017  . Liver lesion 10/23/2017  . History of colon cancer 10/23/2017  . Perirectal abscess 02/24/2017  . Moderate protein-calorie malnutrition ([place]) 02/24/2017  . Sepsis ([place]) 02/24/2017  . Pulmonary nodule 02/24/2017  . Alcoholic cirrhosis of liver without ascites ([place]) 02/24/2017  . Duodenitis 06/13/2016  . Hyponatremia 06/12/2016  . Nausea & vomiting 06/12/2016  . Thrombocytopenia ([place]) 06/12/2016  . Acute gastroenteritis 06/12/2016  . PTSD (post-traumatic stress disorder) 12/02/2012  . Hx of substance abuse- clean several years now 12/02/2012  . Chest pain with moderate risk of acute coronary syndrome 11/30/2012  . Obesity 11/30/2012  . Hypertension 11/30/2012  . Hyperlipemia 11/30/2012  . Type II diabetes mellitus ([place]) 11/30/2012  . CAD (coronary artery disease)- PCI 2001- no evaluation since 11/30/2012   PCP:  [name], MD Pharmacy:   [facility] [place] [place] [address] Phone: [phone] Fax: [phone]  [place], [place] [address] [place] [address] Phone: [phone] Fax: [phone]     Social Determinants of Health (SDOH) Interventions    Readmission Risk Interventions Readmission Risk Prevention Plan 05/17/2018  Transportation Screening Complete  Medication Review (RN Care Manager) Complete  PCP or Specialist appointment within 3-5 days of discharge Not Complete  PCP/Specialist Appt Not Complete  comments patient states he will call to arrange appointment on Monday  HRI or Home Care Consult Patient refused  SW Recovery Care/Counseling Consult Complete  Palliative Care Screening Not [place] Not Applicable  Some recent data might be hidden

## 2018-05-17 NOTE — Care Management Important Message (Signed)
Important Message  Patient Details  Name: NAYDEN CZAJKA MRN: [id number] Date of Birth: 10-22-1955   Medicare Important Message Given:  Yes    Erenest Rasher, RN 05/17/2018, 11:13 AM

## 2018-05-17 NOTE — Discharge Summary (Signed)
Physician Discharge Summary  Matthew Lewis ZOX:[id number] DOB: 06-02-1955 DOA: 05/12/2018  PCP: [name], MD  Admit date: 05/12/2018 Discharge date: 05/17/2018  Admitted From: Home Disposition:  Home  Discharge Condition:Stable CODE STATUS:DNR Diet recommendation:Soft diet for next 2-3 days  Brief/Interim Summary: Patient is a 63 year old male with history of metastatic cholangiocarcinoma, portal vein thrombosis , diabetes type 2 ,biliary obstruction status post percutaneous drains who was admitted on 05/12/2018 with syncopal episode, hypertension.  Patient developed hematemesis with acute blood loss anemia.  He underwent PRBC transfusions for anemia.  EGD showed variceal bleeding and he underwent banding procedure.  Underwent repeat EGD with additional bandings.  He was transferred to [facility] service because he required vasopressors for blood pressure support. Currently hemodynamically stable.  Patient transferred to [facility] service on 05/16/18. This morning his hemoglobin is stable.  No more hematemesis or bloody bowel movements.  Blood pressure stable.  He is hemodynamically stable for discharge to home today.  He will follow-up with gastroenterology as an outpatient.  Following problems were addressed during his hospitalization:  Upper GI bleed: Grade 2, large varices found in the lower third of esophagus as per EGD on 5/6.  4 bands were placed.  Underwent repeat EGD on 5/7.  3 additional banding's placed.  Found to have esophagitis and portal hypertensive gastritis.  GI was following.  Started on ceftriaxone for prophylaxis. Upper GI bleed secondary to cirrhosis, portal hypertension  Diet advanced to soft.  Octreotide discontinued.  Continue PPI 40 mg daily. Plan for repeat EGD with Dr. Joylene Igo in 4 weeks.  GI will arrange the follow-up.  Hemorrhagic shock in the setting of GI bleed: Currently blood pressure stable.  He needed pressor support and was under PCCM service.  Currently  off pressors.   He was also started on hydrocortisone for persistent hypotension.    Cirrhosis/metastatic cholangiocarcinoma with biliary obstruction: On second line chemotherapy.  Oncology was following.  Biliary obstruction: Has 2 percutaneous drains.  Leukocytosis: Suspected to be from Granix.  Continue to monitor as an outpatient.   Hypomagnesemia: Supplemented.  Diabetes type 2: On insulin at home.   Chronic Pain syndrome: On home gabapentin and Cymbalta.    Deconditioning/debility: Patient seen by physical therapy.  No follow-up recommended.     Discharge Diagnoses:  Active Problems:   Type II diabetes mellitus (HCC)   Hyponatremia   Biliary obstruction due to malignant neoplasm (HCC)   Malignant neoplasm metastatic to adrenal gland (HCC)   Intrahepatic cholangiocarcinoma (HCC)   Hypotension   Upper GI bleed   SIRS (systemic inflammatory response syndrome) (HCC)   Macrocytic anemia   Syncope and collapse   Hypovolemic shock (HCC)   Idiopathic esophageal varices with bleeding [facility])    Discharge Instructions  Discharge Instructions    Diet - low sodium heart healthy   Complete by:  As directed    Soft diet for next 2-3 days   Discharge instructions   Complete by:  As directed    1)Please follow-up with your PCP in a week.  Do a CBC, BMP test during the follow-up 2)Follow up with your gastroenterologist as an outpatient. 3)take prescribed medications as instructed. 4)Take soft diet for next 2 to 3 days. 5)Monitor your blood sugars at home.You are on insulin which can cause hypoglycemia.  Always check your blood sugars before taking insulin. 6)Continue drain care.  Follow-up with IR for the drain management.   Increase activity slowly   Complete by:  As directed  Allergies as of 05/17/2018      Reactions   Morphine And Related Other (See Comments)   Patient is recovering from drug addiction and wants to avoid any narcotics      Medication List     STOP taking these medications   lactose free nutrition Liqd     TAKE these medications   cetirizine 10 MG tablet Commonly known as:  ZYRTEC Take 10 mg by mouth at bedtime.   DULoxetine 30 MG capsule Commonly known as:  CYMBALTA Take 30 mg by mouth 2 (two) times daily.   fexofenadine 180 MG tablet Commonly known as:  ALLEGRA Take 180 mg by mouth daily after breakfast.   gabapentin 400 MG capsule Commonly known as:  NEURONTIN Take 400-800 mg by mouth See admin instructions. Take 400 mg by mouth in the morning and 800 mg in the evening   Levemir 100 UNIT/ML injection Generic drug:  insulin detemir Inject 10-62 Units into the skin See admin instructions. Take 62 in the am and 10 in the pm   lidocaine 5 % Commonly known as:  LIDODERM Place 1 patch onto the skin daily. Remove & Discard patch within 12 hours or as directed by MD   lidocaine-prilocaine cream Commonly known as:  EMLA Apply to port site 1 hour prior to access What changed:    how much to take  how to take this  when to take this   multivitamin tablet Take 1 tablet by mouth daily.   Normal Saline Flush 0.9 % Soln Inject 10 mLs into the vein daily.   pantoprazole 40 MG tablet Commonly known as:  PROTONIX Take 1 tablet (40 mg total) by mouth daily at 6 (six) AM. Start taking on:  May 18, 2018 What changed:  when to take this   polyethylene glycol 17 g packet Commonly known as:  MIRALAX / GLYCOLAX Take 17 g by mouth daily as needed for mild constipation.   ROLAIDS PO Take 1-2 tablets by mouth 2 (two) times daily as needed (heartburn).   tamsulosin 0.4 MG Caps capsule Commonly known as:  FLOMAX TAKE 1 CAPSULE(0.4 MG) BY MOUTH DAILY AFTER SUPPER What changed:    how much to take  how to take this  when to take this  additional instructions      Follow-up Information    [name], MD. Schedule an appointment as soon as possible for a visit in 1 week(s).   Specialty:  Internal  Medicine Contact information: [place] [address] [phone]          Allergies  Allergen Reactions  . Morphine And Related Other (See Comments)    Patient is recovering from drug addiction and wants to avoid any narcotics    Consultations:  PCCM, GI, oncology   Procedures/Studies: Dg Chest 1 View  Result Date: 05/12/2018 CLINICAL DATA:  Fall, syncope, hypotension EXAM: CHEST  1 VIEW COMPARISON:  02/24/2018.  Chest CT 02/19/2018 FINDINGS: Right Port-A-Cath in place with the tip in the right atrium. Low lung volumes. Heart is normal size. Previously seen left lower lobe/basilar lung metastasis not as well visualized as on prior CT. No acute airspace consolidation or effusion. No acute bony abnormality. IMPRESSION: Previously seen left basilar metastasis better seen on prior CT. No new acute cardiopulmonary process. Electronically Signed   By: Rolm Baptise M.D.   On: 05/12/2018 02:43   Dg Lumbar Spine Complete  Result Date: 05/12/2018 CLINICAL DATA:  Hypotension.  Syncope,  fall EXAM: LUMBAR SPINE - COMPLETE 4+ VIEW COMPARISON:  None. FINDINGS: Lumbar straightening. Diffuse advanced degenerative disc and facet disease. 7 mm of retrolisthesis of L3 on L4. 11 mm retrolisthesis of L4 on L5. 6 mm anterolisthesis of L5 on S1. No fracture. SI joints symmetric and unremarkable. IMPRESSION: Degenerative disc and facet disease as above with associated multilevel listhesis. No acute bony abnormality. Electronically Signed   By: Rolm Baptise M.D.   On: 05/12/2018 02:44   Ct Head Wo Contrast  Result Date: 05/12/2018 CLINICAL DATA:  Headache, hypotension, fall, syncope EXAM: CT HEAD WITHOUT CONTRAST CT CERVICAL SPINE WITHOUT CONTRAST TECHNIQUE: Multidetector CT imaging of the head and cervical spine was performed following the standard protocol without intravenous contrast. Multiplanar CT image reconstructions of the cervical spine were also generated. COMPARISON:   None. FINDINGS: CT HEAD FINDINGS Brain: No acute intracranial abnormality. Specifically, no hemorrhage, hydrocephalus, mass lesion, acute infarction, or significant intracranial injury. Vascular: No hyperdense vessel or unexpected calcification. Skull: No acute calvarial abnormality. Sinuses/Orbits: Visualized paranasal sinuses and mastoids clear. Orbital soft tissues unremarkable. Other: None CT CERVICAL SPINE FINDINGS Alignment: No subluxation Skull base and vertebrae: No acute fracture. No primary bone lesion or focal pathologic process. Soft tissues and spinal canal: No prevertebral fluid or swelling. No visible canal hematoma. Disc levels: Prior anterior fusion from C5-T1. Degenerative disc disease at C4-5 with anterior spurring. Mild diffuse degenerative facet disease. Upper chest: No acute findings Other: Carotid artery calcifications. IMPRESSION: No acute intracranial abnormality. Postoperative changes and degenerative changes in the cervical spine. No acute bony abnormality. Electronically Signed   By: Rolm Baptise M.D.   On: 05/12/2018 03:01   Ct Cervical Spine Wo Contrast  Result Date: 05/12/2018 CLINICAL DATA:  Headache, hypotension, fall, syncope EXAM: CT HEAD WITHOUT CONTRAST CT CERVICAL SPINE WITHOUT CONTRAST TECHNIQUE: Multidetector CT imaging of the head and cervical spine was performed following the standard protocol without intravenous contrast. Multiplanar CT image reconstructions of the cervical spine were also generated. COMPARISON:  None. FINDINGS: CT HEAD FINDINGS Brain: No acute intracranial abnormality. Specifically, no hemorrhage, hydrocephalus, mass lesion, acute infarction, or significant intracranial injury. Vascular: No hyperdense vessel or unexpected calcification. Skull: No acute calvarial abnormality. Sinuses/Orbits: Visualized paranasal sinuses and mastoids clear. Orbital soft tissues unremarkable. Other: None CT CERVICAL SPINE FINDINGS Alignment: No subluxation Skull base and  vertebrae: No acute fracture. No primary bone lesion or focal pathologic process. Soft tissues and spinal canal: No prevertebral fluid or swelling. No visible canal hematoma. Disc levels: Prior anterior fusion from C5-T1. Degenerative disc disease at C4-5 with anterior spurring. Mild diffuse degenerative facet disease. Upper chest: No acute findings Other: Carotid artery calcifications. IMPRESSION: No acute intracranial abnormality. Postoperative changes and degenerative changes in the cervical spine. No acute bony abnormality. Electronically Signed   By: Rolm Baptise M.D.   On: 05/12/2018 03:01   Ir Cholangiogram Existing Tube  Result Date: 05/04/2018 INDICATION: 63 year old male with a history of cholangiocarcinoma status post both right and left percutaneous biliary drains. He has required frequent drain manipulations due to leaking from his right-sided drainage catheter. Most recently he was evaluated on 04/28/2018 and the right-sided drainage catheter was upsized to 12 [place]. He continues to experience leaking from the drain site, particularly when he first wakes up in the morning and becomes more active. He has to changes bandages multiple times during the day. He is currently flushing the drainage catheter once per day, typically in the evening after dinner. He denies any pain or  discomfort. His tube has not pulled back. EXAM: CHOLANGIOGRAM VIA EXISTING CATHETER MEDICATIONS: None ANESTHESIA/SEDATION: None FLUOROSCOPY TIME:  Fluoroscopy Time: 0 minutes 18 seconds (3 mGy). COMPLICATIONS: None immediate. PROCEDURE: Informed written consent was obtained from the patient after a thorough discussion of the procedural risks, benefits and alternatives. All questions were addressed. A timeout was performed prior to the initiation of the procedure. The tube was evaluated fluoroscopically. The side-hole marker appears to be in good position. The tube is well formed. The left-sided tube remains in unchanged  position. A gentle hand injection of contrast material was performed. The tube is widely patent. Contrast material opacifies the biliary tree and duodenum. The tube is working well. There is no evidence of biliary ductal dilatation to suggest undrained segments of the biliary tree. No evidence of reflux of contrast material along the catheter tubing and to the skin surface. The catheter was gently flushed and secured to the skin with a new stay-fix device. IMPRESSION: 1. Well-positioned and normally functioning 12 [place] right-sided percutaneous biliary drain. PLAN: I can see no benefit to exchanging this normally functioning and relatively new drainage catheter at this time. After discussing his flushing schedule and the timing of his leaks, I advised him to begin flushing his drainage catheter as soon as he wakes up in the morning before becoming active in order to clear any blockage and facilitate maximal drainage during the time that he typically leaks. Additionally, we discussed potentially flushing twice per day in an effort to keep the tube free from debris and draining well. He has had no issues with his left-sided tube and would like to consider a capping trial. We capped his left-sided tube. Signed, Criselda Peaches, MD, [facility] Electronically Signed   By: Jacqulynn Cadet M.D.   On: 05/04/2018 16:01   Ir Exchange Biliary Drain  Result Date: 04/28/2018 INDICATION: History of cholangiocarcinoma with prior percutaneous biliary drainage of both right-sided and left-sided intrahepatic bile ducts for biliary obstruction with placement of right-sided 10 French internal/external biliary drainage catheter and left-sided 10 French internal/external biliary drainage catheter. The right-sided catheter was exchanged on 04/23/2018 due to partial retraction and bile leak around the exit site of the catheter. There remains bile leak around this  catheter as well as pain at the exit site felt to be related to the retention suture. The left-sided biliary drain also has now stopped draining and flushing of this catheter results in saline exiting the skin at the catheter entrance. EXAM: 1. EXCHANGE AND UP SIZING OF RIGHT-SIDED PERCUTANEOUS INTERNAL/EXTERNAL BILIARY DRAINAGE CATHETER 2. EXCHANGE OF LEFT-SIDED PERCUTANEOUS INTERNAL/EXTERNAL BILIARY DRAINAGE CATHETER MEDICATIONS: 2 g IV Ancef; The antibiotic was administered within an appropriate time frame prior to the initiation of the procedure. ANESTHESIA/SEDATION: Moderate (conscious) sedation was employed during this procedure. A total of Versed 0.5 mg and Fentanyl 50 mcg was administered intravenously. Moderate Sedation Time: 25 minutes. The patient's level of consciousness and vital signs were monitored continuously by radiology nursing throughout the procedure under my direct supervision. CONTRAST:  20 mL Omnipaque 300 FLUOROSCOPY TIME:  Fluoroscopy Time: 3 minutes and 18 seconds. 62.0 mGy. COMPLICATIONS: None immediate. PROCEDURE: Informed written consent was obtained from the patient after a thorough discussion of the procedural risks, benefits and alternatives. All questions were addressed. Maximal Sterile Barrier Technique was utilized including caps, mask, sterile gowns, sterile gloves, sterile drape, hand hygiene and skin antiseptic. A timeout was performed prior to the initiation of the procedure.  The left hepatic internal/external biliary drainage catheter was attempted to be injected with contrast material. The catheter was then cut and removed over a guidewire. A 5 [place] Kumpe the catheter was advanced over the wire. This catheter was further advanced into the left intrahepatic biliary tree under fluoroscopy and ultimately through the common bile duct and into the duodenum over a guidewire. A new 10 French internal/external biliary drainage catheter was advanced over the wire and formed in the  duodenum. Positioning within the left lobe of the liver was confirmed with a fluoroscopic spot image obtained after injection of contrast. The catheter was flushed with saline and connected to a new gravity drainage bag. The pre-existing 10 [place] internal/external biliary drainage catheter in the right lobe of the liver was then injected with contrast material, cut and removed over a guidewire. A new 12 French internal/external biliary drainage catheter was then advanced and formed in the duodenum. Catheter position was confirmed by a fluoroscopic spot image obtained after injection of contrast. The catheter was flushed with saline and connected to a new gravity drainage bag. Both catheters were secured at the skin with StatLock devices. FINDINGS: The pre-existing left-sided biliary drainage catheter has migrated nearly completely out of the left lobe of the liver. The tube could also not be flushed. Access through the left-sided bile ducts was able to be gained with a catheter allowing replacement of the left-sided biliary drain. A new 10 French drain was formed in the duodenum with sideholes extending up into the left lobe of the liver. Due to persistent leakage of bile around the exit site of the pre-existing 10 [place] right-sided biliary drain despite recent exchange, decision was made to upsize the tube to 12 [place] today. A new 12 [place] biliary drainage catheter was advanced and formed in the duodenum with sideholes extending up into the right lobe of the liver. Due to skin irritation from previously placed retention sutures, the patient requested that retention sutures not be placed today. IMPRESSION: 1. Exchange of retracted left-sided internal/external biliary drainage catheter which had retracted nearly outside of the liver. A new 10 French catheter was advanced and formed in the duodenum. 2. Exchange and up sizing of right-sided biliary drainage catheter with new 12 French internal/external biliary  drainage catheter. 3. The patient will be scheduled for elective exchange of both biliary drains in approximately 10 weeks. Electronically Signed   By: Aletta Edouard M.D.   On: 04/28/2018 11:21   Ir Exchange Biliary Drain  Result Date: 04/28/2018 INDICATION: History of cholangiocarcinoma with prior percutaneous biliary drainage of both right-sided and left-sided intrahepatic bile ducts for biliary obstruction with placement of right-sided 10 French internal/external biliary drainage catheter and left-sided 10 French internal/external biliary drainage catheter. The right-sided catheter was exchanged on 04/23/2018 due to partial retraction and bile leak around the exit site of the catheter. There remains bile leak around this catheter as well as pain at the exit site felt to be related to the retention suture. The left-sided biliary drain also has now stopped draining and flushing of this catheter results in saline exiting the skin at the catheter entrance. EXAM: 1. EXCHANGE AND UP SIZING OF RIGHT-SIDED PERCUTANEOUS INTERNAL/EXTERNAL BILIARY DRAINAGE CATHETER 2. EXCHANGE OF LEFT-SIDED PERCUTANEOUS INTERNAL/EXTERNAL BILIARY DRAINAGE CATHETER MEDICATIONS: 2 g IV Ancef; The antibiotic was administered within an appropriate time frame prior to the initiation of the procedure. ANESTHESIA/SEDATION: Moderate (conscious) sedation was employed during this procedure. A total of Versed 0.5 mg and Fentanyl 50 mcg was  administered intravenously. Moderate Sedation Time: 25 minutes. The patient's level of consciousness and vital signs were monitored continuously by radiology nursing throughout the procedure under my direct supervision. CONTRAST:  20 mL Omnipaque 300 FLUOROSCOPY TIME:  Fluoroscopy Time: 3 minutes and 18 seconds. 62.0 mGy. COMPLICATIONS: None immediate. PROCEDURE: Informed written consent was obtained from the patient after a thorough discussion of the procedural risks, benefits and alternatives. All questions  were addressed. Maximal Sterile Barrier Technique was utilized including caps, mask, sterile gowns, sterile gloves, sterile drape, hand hygiene and skin antiseptic. A timeout was performed prior to the initiation of the procedure. The left hepatic internal/external biliary drainage catheter was attempted to be injected with contrast material. The catheter was then cut and removed over a guidewire. A 5 [place] Kumpe the catheter was advanced over the wire. This catheter was further advanced into the left intrahepatic biliary tree under fluoroscopy and ultimately through the common bile duct and into the duodenum over a guidewire. A new 10 French internal/external biliary drainage catheter was advanced over the wire and formed in the duodenum. Positioning within the left lobe of the liver was confirmed with a fluoroscopic spot image obtained after injection of contrast. The catheter was flushed with saline and connected to a new gravity drainage bag. The pre-existing 10 [place] internal/external biliary drainage catheter in the right lobe of the liver was then injected with contrast material, cut and removed over a guidewire. A new 12 French internal/external biliary drainage catheter was then advanced and formed in the duodenum. Catheter position was confirmed by a fluoroscopic spot image obtained after injection of contrast. The catheter was flushed with saline and connected to a new gravity drainage bag. Both catheters were secured at the skin with StatLock devices. FINDINGS: The pre-existing left-sided biliary drainage catheter has migrated nearly completely out of the left lobe of the liver. The tube could also not be flushed. Access through the left-sided bile ducts was able to be gained with a catheter allowing replacement of the left-sided biliary drain. A new 10 French drain was formed in the duodenum with sideholes extending up into the left lobe of the liver. Due to persistent leakage of bile around the exit  site of the pre-existing 10 [place] right-sided biliary drain despite recent exchange, decision was made to upsize the tube to 12 [place] today. A new 12 [place] biliary drainage catheter was advanced and formed in the duodenum with sideholes extending up into the right lobe of the liver. Due to skin irritation from previously placed retention sutures, the patient requested that retention sutures not be placed today. IMPRESSION: 1. Exchange of retracted left-sided internal/external biliary drainage catheter which had retracted nearly outside of the liver. A new 10 French catheter was advanced and formed in the duodenum. 2. Exchange and up sizing of right-sided biliary drainage catheter with new 12 French internal/external biliary drainage catheter. 3. The patient will be scheduled for elective exchange of both biliary drains in approximately 10 weeks. Electronically Signed   By: Aletta Edouard M.D.   On: 04/28/2018 11:21   Ir Exchange Biliary Drain  Result Date: 04/23/2018 INDICATION: 63 year old male with a history of cholangiocarcinoma requiring bilateral percutaneous biliary drain placement. He presents from the cancer center with complaints of continuous leakage from around his right-sided biliary drain. EXAM: Biliary drain exchange MEDICATIONS: None ANESTHESIA/SEDATION: None FLUOROSCOPY TIME:  Fluoroscopy Time: 1 minutes 36 seconds (54 mGy). COMPLICATIONS: None immediate. PROCEDURE: Informed written consent was obtained from the patient after a thorough  discussion of the procedural risks, benefits and alternatives. All questions were addressed. Maximal Sterile Barrier Technique was utilized including caps, mask, sterile gowns, sterile gloves, sterile drape, hand hygiene and skin antiseptic. A timeout was performed prior to the initiation of the procedure. The drains were evaluated fluoroscopically. Both the left and right drain have significantly pulled back compared to the time of prior placement. The  sideholes of the right-sided drainage catheter are partially out of the biliary tree and within the hepatic parenchyma. This is likely the source of the patient's leak. The left drain remains asymptomatic and has no issues with leak. After discussion with the patient, he is excepting of pursuing exchange of the right-sided biliary drain but would not like to have the left biliary drain exchanged given the fact that it is not causing him any trouble at this time. The existing catheter was cut and removed over a wire. A Kumpe the catheter was advanced over the wire and used to navigate the wire back into the duodenum. A new 10 French percutaneous biliary drainage catheter was then advanced over the wire and formed with the locking loop in the duodenum. The patient was given instructions to maintain his gravity bag no lower than his thigh. He has been wearing it on his ankle which may be creating traction on the tube in facilitating the tube pulling back. He has had multiple drain exchanges over the past several weeks. The new drainage catheter was affixed to the skin with an 0 Prolene suture following satisfactory local anesthesia by infiltration with 1% lidocaine. The patient tolerated the procedure well. IMPRESSION: Successful exchange of pulled back right-sided percutaneous internal/external 10 French biliary drainage catheter. The catheter was secured to the skin with 0 Prolene suture. Electronically Signed   By: Jacqulynn Cadet M.D.   On: 04/23/2018 16:19       Subjective: Patient seen and examined the bedside this morning.  Hemodynamically stable.  Comfortable.  Stable for discharge today.  Discharge Exam: Vitals:   05/17/18 0600 05/17/18 0812  BP: 115/82   Pulse: 90   Resp: 17   Temp:  97.6 F (36.4 C)  SpO2: 99%    Vitals:   05/17/18 0400 05/17/18 0500 05/17/18 0600 05/17/18 0812  BP: 112/74 105/71 115/82   Pulse: 86 87 90   Resp: 13 13 17    Temp: 98 F (36.7 C)   97.6 F (36.4 C)   TempSrc: Axillary   Oral  SpO2: 100% 94% 99%   Weight:      Height:        General: Pt is alert, awake, not in acute distress Cardiovascular: RRR, S1/S2 +, no rubs, no gallops Respiratory: CTA bilaterally, no wheezing, no rhonchi Abdominal: Soft, NT,  bowel sounds +,ascitis,2 biliary  drains Extremities: Trace edema, no cyanosis    The results of significant diagnostics from this hospitalization (including imaging, microbiology, ancillary and laboratory) are listed below for reference.     Microbiology: Recent Results (from the past 240 hour(s))  Blood culture (routine x 2)     Status: None   Collection Time: 05/12/18  2:01 AM  Result Value Ref Range Status   Specimen Description   Final    BLOOD BLOOD LEFT FOREARM Performed at [facility] [address] [place], [place] [address]    Special Requests   Final    BOTTLES DRAWN AEROBIC AND ANAEROBIC Blood Culture adequate volume Performed at [facility] [address] [place],  [place] [address]    Culture   Final    NO GROWTH 5 DAYS Performed at [facility], [place] [address], [place], [place] [address]    Report Status 05/17/2018 FINAL  Final  SARS Coronavirus 2 (CEPHEID - Performed in [facility] hospital lab), Hosp Order     Status: None   Collection Time: 05/12/18  2:06 AM  Result Value Ref Range Status   SARS Coronavirus 2 NEGATIVE NEGATIVE Final    Comment: (NOTE) If result is NEGATIVE SARS-CoV-2 target nucleic acids are NOT DETECTED. The SARS-CoV-2 RNA is generally detectable in upper and lower  respiratory specimens during the acute phase of infection. The lowest  concentration of SARS-CoV-2 viral copies this assay can detect is 250  copies / mL. A negative result does not preclude SARS-CoV-2 infection  and should not be used as the sole basis for treatment or other  patient management decisions.  A negative result may occur with  improper specimen collection /  handling, submission of specimen other  than nasopharyngeal swab, presence of viral mutation(s) within the  areas targeted by this assay, and inadequate number of viral copies  (<250 copies / mL). A negative result must be combined with clinical  observations, patient history, and epidemiological information. If result is POSITIVE SARS-CoV-2 target nucleic acids are DETECTED. The SARS-CoV-2 RNA is generally detectable in upper and lower  respiratory specimens dur ing the acute phase of infection.  Positive  results are indicative of active infection with SARS-CoV-2.  Clinical  correlation with patient history and other diagnostic information is  necessary to determine patient infection status.  Positive results do  not rule out bacterial infection or co-infection with other viruses. If result is PRESUMPTIVE POSTIVE SARS-CoV-2 nucleic acids MAY BE PRESENT.   A presumptive positive result was obtained on the submitted specimen  and confirmed on repeat testing.  While 2019 novel coronavirus  (SARS-CoV-2) nucleic acids may be present in the submitted sample  additional confirmatory testing may be necessary for epidemiological  and / or clinical management purposes  to differentiate between  SARS-CoV-2 and other Sarbecovirus currently known to infect humans.  If clinically indicated additional testing with an alternate test  methodology [id number]) is advised. The SARS-CoV-2 RNA is generally  detectable in upper and lower respiratory sp ecimens during the acute  phase of infection. The expected result is Negative. Fact Sheet for Patients:  StrictlyIdeas.no Fact Sheet for Healthcare Providers: BankingDealers.co.za This test is not yet approved or cleared by the [place] FDA and has been authorized for detection and/or diagnosis of SARS-CoV-2 by FDA under an Emergency Use Authorization (EUA).  This EUA will remain in effect (meaning this  test can be used) for the duration of the COVID-19 declaration under Section 564(b)(1) of the Act, 21 U.S.C. section 360bbb-3(b)(1), unless the authorization is terminated or revoked sooner. Performed at [facility], [place] [address], [place], [place] [address]   Blood culture (routine x 2)     Status: None   Collection Time: 05/12/18  2:54 AM  Result Value Ref Range Status   Specimen Description   Final    BLOOD RIGHT HAND Performed at [facility] [address] [place], [place] [address]    Special Requests   Final    BOTTLES DRAWN AEROBIC AND ANAEROBIC Blood Culture adequate volume Performed at [facility] [address], [place], [place] [address]    Culture   Final  NO GROWTH 5 DAYS Performed at [facility], [place] [address], [place], [place] [address]    Report Status 05/17/2018 FINAL  Final  Urine culture     Status: None   Collection Time: 05/12/18  6:49 AM  Result Value Ref Range Status   Specimen Description   Final    URINE, RANDOM Performed at [facility] [address], [place], [place] [address]    Special Requests   Final    NONE Performed at [facility], [place] [address] [place], [place] [address]    Culture   Final    NO GROWTH Performed at [facility], [place] [address], [place], [place] [address]    Report Status 05/13/2018 FINAL  Final  MRSA PCR Screening     Status: None   Collection Time: 05/12/18  7:38 AM  Result Value Ref Range Status   MRSA by PCR NEGATIVE NEGATIVE Final    Comment:        The GeneXpert MRSA Assay (FDA approved for NASAL specimens only), is one component of a comprehensive MRSA colonization surveillance program. It is not intended to diagnose MRSA infection nor to guide or monitor treatment for MRSA infections. Performed at [facility], [place] [address], [place], [place] [address]       Labs: BNP (last 3 results) No results for input(s): BNP in the last 8760 hours. Basic Metabolic Panel: Recent Labs  Lab 05/12/18 0201 05/13/18 0303 05/14/18 0500 05/15/18 0623 05/16/18 0605  NA 132* 137 132* 132* 136  K 4.5 5.5* 4.6 4.1 4.1  CL 103 112* 109 106 104  CO2 20* 17* 20* 20* 23  GLUCOSE 172* 240* 236* 205* 208*  BUN 21 41* 29* 27* 20  CREATININE 0.70 0.69 0.66 0.62 0.63  CALCIUM 7.7* 7.3* 7.9* 8.0* 8.1*  MG  --  1.6* 1.9  --   --    Liver Function Tests: Recent Labs  Lab 05/12/18 0201 05/13/18 0303  AST 44* 30  ALT 35 29  ALKPHOS 206* 148*  BILITOT 1.1 4.6*  PROT 5.9* 4.4*  ALBUMIN 2.3* 2.2*   No results for input(s): LIPASE, AMYLASE in the last 168 hours. No results for input(s): AMMONIA in the last 168 hours. CBC: Recent Labs  Lab 05/12/18 0201  05/13/18 0303  05/14/18 0500  05/15/18 0623 05/15/18 1109 05/15/18 2002 05/16/18 0605 05/16/18 1030 05/17/18 0557  WBC 46.5*  --  68.9*  --  35.8*  --  28.0*  --   --  29.5*  --  34.1*  NEUTROABS 40.5*  --  63.7*  --   --   --   --   --   --   --   --  30.2*  HGB 7.7*   < > 8.7*   < > 8.0*   < > 7.7* 8.0* 8.2* 8.2* 9.0* 8.8*  HCT 24.2*   < > 25.9*   < > 23.5*   < > 23.0* 23.6* 25.2* 24.8* 27.5* 27.3*  MCV 104.3*  --  92.8  --  91.1  --  94.3  --   --  96.9  --  98.2  PLT 181  --  173  --  97*  --  98*  --   --  113*  --  129*   < > = values in this interval not displayed.   Cardiac Enzymes: Recent Labs  Lab 05/12/18 0201  TROPONINI <0.03  BNP: Invalid input(s): POCBNP CBG: Recent Labs  Lab 05/16/18 1147 05/16/18 1617 05/16/18 1754 05/16/18 2112 05/17/18 0734  GLUCAP 284* 234* 254* 193* 179*   D-Dimer No results for input(s): DDIMER in the last 72 hours. Hgb A1c No results for input(s): HGBA1C in the last 72 hours. Lipid Profile No results for input(s): CHOL, HDL, LDLCALC, TRIG, CHOLHDL, LDLDIRECT in the last 72 hours. Thyroid function studies No results for input(s): TSH,  T4TOTAL, T3FREE, THYROIDAB in the last 72 hours.  Invalid input(s): FREET3 Anemia work up No results for input(s): VITAMINB12, FOLATE, FERRITIN, TIBC, IRON, RETICCTPCT in the last 72 hours. Urinalysis    Component Value Date/Time   COLORURINE YELLOW 05/12/2018 0649   APPEARANCEUR HAZY (A) 05/12/2018 0649   LABSPEC 1.016 05/12/2018 0649   PHURINE 5.0 05/12/2018 0649   GLUCOSEU NEGATIVE 05/12/2018 0649   HGBUR NEGATIVE 05/12/2018 0649   BILIRUBINUR NEGATIVE 05/12/2018 0649   KETONESUR 5 (A) 05/12/2018 0649   PROTEINUR NEGATIVE 05/12/2018 0649   UROBILINOGEN 0.2 08/15/2011 1816   NITRITE NEGATIVE 05/12/2018 0649   LEUKOCYTESUR NEGATIVE 05/12/2018 0649   Sepsis Labs Invalid input(s): PROCALCITONIN,  WBC,  LACTICIDVEN Microbiology Recent Results (from the past 240 hour(s))  Blood culture (routine x 2)     Status: None   Collection Time: 05/12/18  2:01 AM  Result Value Ref Range Status   Specimen Description   Final    BLOOD BLOOD LEFT FOREARM Performed at [facility], [place] [address] [place], [place] [address]    Special Requests   Final    BOTTLES DRAWN AEROBIC AND ANAEROBIC Blood Culture adequate volume Performed at [facility] [address], [place], [place] [address]    Culture   Final    NO GROWTH 5 DAYS Performed at [facility], [place] [address] [place], [place] [address]    Report Status 05/17/2018 FINAL  Final  SARS Coronavirus 2 (CEPHEID - Performed in [facility] hospital lab), Hosp Order     Status: None   Collection Time: 05/12/18  2:06 AM  Result Value Ref Range Status   SARS Coronavirus 2 NEGATIVE NEGATIVE Final    Comment: (NOTE) If result is NEGATIVE SARS-CoV-2 target nucleic acids are NOT DETECTED. The SARS-CoV-2 RNA is generally detectable in upper and lower  respiratory specimens during the acute phase of infection. The lowest  concentration of SARS-CoV-2 viral copies this assay can detect is 250  copies  / mL. A negative result does not preclude SARS-CoV-2 infection  and should not be used as the sole basis for treatment or other  patient management decisions.  A negative result may occur with  improper specimen collection / handling, submission of specimen other  than nasopharyngeal swab, presence of viral mutation(s) within the  areas targeted by this assay, and inadequate number of viral copies  (<250 copies / mL). A negative result must be combined with clinical  observations, patient history, and epidemiological information. If result is POSITIVE SARS-CoV-2 target nucleic acids are DETECTED. The SARS-CoV-2 RNA is generally detectable in upper and lower  respiratory specimens dur ing the acute phase of infection.  Positive  results are indicative of active infection with SARS-CoV-2.  Clinical  correlation with patient history and other diagnostic information is  necessary to determine patient infection status.  Positive results do  not rule out bacterial infection or co-infection with other viruses. If result is PRESUMPTIVE POSTIVE SARS-CoV-2 nucleic acids MAY BE PRESENT.   A presumptive  positive result was obtained on the submitted specimen  and confirmed on repeat testing.  While 2019 novel coronavirus  (SARS-CoV-2) nucleic acids may be present in the submitted sample  additional confirmatory testing may be necessary for epidemiological  and / or clinical management purposes  to differentiate between  SARS-CoV-2 and other Sarbecovirus currently known to infect humans.  If clinically indicated additional testing with an alternate test  methodology [phone]) is advised. The SARS-CoV-2 RNA is generally  detectable in upper and lower respiratory sp ecimens during the acute  phase of infection. The expected result is Negative. Fact Sheet for Patients:  StrictlyIdeas.no Fact Sheet for Healthcare Providers: BankingDealers.co.za This test  is not yet approved or cleared by the [place] FDA and has been authorized for detection and/or diagnosis of SARS-CoV-2 by FDA under an Emergency Use Authorization (EUA).  This EUA will remain in effect (meaning this test can be used) for the duration of the COVID-19 declaration under Section 564(b)(1) of the Act, 21 U.S.C. section 360bbb-3(b)(1), unless the authorization is terminated or revoked sooner. Performed at [facility], [place] [address], [place], [place] [address]   Blood culture (routine x 2)     Status: None   Collection Time: 05/12/18  2:54 AM  Result Value Ref Range Status   Specimen Description   Final    BLOOD RIGHT HAND Performed at [facility] [address], [place], [place] [address]    Special Requests   Final    BOTTLES DRAWN AEROBIC AND ANAEROBIC Blood Culture adequate volume Performed at [facility] [address], [place], [place] [address]    Culture   Final    NO GROWTH 5 DAYS Performed at [facility], [place] [address] [place], [place] [address]    Report Status 05/17/2018 FINAL  Final  Urine culture     Status: None   Collection Time: 05/12/18  6:49 AM  Result Value Ref Range Status   Specimen Description   Final    URINE, RANDOM Performed at [facility] [address], [place], [place] [address]    Special Requests   Final    NONE Performed at [facility], [place] [address] [place], [place] [address]    Culture   Final    NO GROWTH Performed at [facility], [place] [address], [place], [place] [address]    Report Status 05/13/2018 FINAL  Final  MRSA PCR Screening     Status: None   Collection Time: 05/12/18  7:38 AM  Result Value Ref Range Status   MRSA by PCR NEGATIVE NEGATIVE Final    Comment:        The GeneXpert MRSA Assay (FDA approved for NASAL specimens only), is one component of a comprehensive MRSA  colonization surveillance program. It is not intended to diagnose MRSA infection nor to guide or monitor treatment for MRSA infections. Performed at [facility], [place] [address] [place], [place] [address]     Please note: You were cared for by a hospitalist during your hospital stay. Once you are discharged, your primary care physician will handle any further medical issues. Please note that NO REFILLS for any discharge medications will be authorized once you are discharged, as it is imperative that you return to your primary care physician (or establish a relationship with a primary care physician if you do not have one) for your post hospital discharge needs so  that they can reassess your need for medications and monitor your lab values.    Time coordinating discharge: 40 minutes  SIGNED:   Shelly Coss, MD  [facility] 05/17/2018, 10:14 AM Pager [phone]  If 7PM-7AM, please contact night-coverage [email or web address] Password [email or web address]

## 2018-05-18 ENCOUNTER — Other Ambulatory Visit: Payer: Self-pay | Admitting: Hematology

## 2018-05-18 DIAGNOSIS — C221 Intrahepatic bile duct carcinoma: Secondary | ICD-10-CM

## 2018-05-18 NOTE — Progress Notes (Signed)
Matthew Lewis   [phone] Fax:([phone]   Clinic Follow up Note   Patient Care Team: [name], MD as PCP - General (Internal Medicine) Valente David, RN as [place] Management  Date of Service:  05/21/2018  CHIEF COMPLAINT: F/u on metastatic cholangiocarcinoma  SUMMARY OF ONCOLOGIC HISTORY: Oncology History   Cancer Staging Intrahepatic cholangiocarcinoma ([place]) Staging form: Intrahepatic Bile Duct, AJCC 8th Edition - Clinical stage from 10/29/2017: Stage IV (cT2, cN1, pM1) - Signed by [name], MD on 11/06/2017       Malignant neoplasm metastatic to adrenal gland [facility])    Initial Diagnosis    Malignant neoplasm metastatic to adrenal gland ([place])    10/23/2017 Imaging    ABD US IMPRESSION: Cirrhotic appearance of the liver.  2.6 cm questionable ill-defined mass versus area of focal parenchymal heterogeneity in segment 6 of the liver. If further imaging evaluation is desired, liver protocol abdominal MRI should be considered.  Splenomegaly.  Apparent thickening of the gallbladder wall, without evidence of cholelithiasis, and with negative sonographic Murphy's sign. The findings are favored to represent gallbladder wall edema secondary to systemic causes such as hyperproteinemia rather than acute cholecystitis. Please correlate clinically.     10/25/2017 Imaging    MRI IMPRESSION: 1. Advanced changes of cirrhosis. Interval development of multifocal enhancing liver lesions, which is concerning for multicentric hepatocellular carcinoma involving both lobes of liver. 2. Lesion within segment 4 B obstructs the right medial hepatic duct and left main duct resulting in moderate to marked biliary ductal dilatation within the left lobe and dome of liver. There may be partial obstruction of the right lateral duct with mild dilatation. 3. Tumor thrombus within the left branch of the portal vein likely secondary to  segment 4 B tumor. 4. Bilateral adrenal metastasis and retroperitoneal nodal metastasis. 5. Stigmata of portal venous hypertension including varices, ascites and splenomegaly. 6. These results will be called to the ordering clinician or representative by the Radiologist Assistant, and communication documented in the PACS or zVision Dashboard.    10/26/2017 Procedure    IMPRESSION: Status post formal PTC and internal/external biliary drainage.    10/27/2017 Imaging    IMPRESSION: Changes of cirrhosis. Multiple hepatic lesions are noted concerning for multifocal hepatoma or metastases. Biliary drain is in place with the tip in the distal common bile duct. There is mild intrahepatic biliary ductal dilatation. Bilateral adrenal nodules and masses, right larger than left compatible with metastases. Retroperitoneal adenopathy. Large stool burden with mild gaseous distention of the colon. Aortic atherosclerosis.     10/29/2017 Pathology Results    Diagnosis Soft Tissue Needle Core Biopsy, right adrenal mass - METASTATIC ADENOCARCINOMA, SEE COMMENT. Microscopic Comment There are malignant glands with central necrosis. Immunohistochemistry is positive for cytokeratin 20 and CDX-2. Cytokeratin 7, TTF-1 and NapsinA are negative. The morphology and immunoprofile favor a colorectal primary over pancreaticobiliary. [name] has reviewed the case. [name] was notified on 10/31/2017.    11/04/2017 Procedure    Colonoscopy impression - Seton in place found on digital rectal exam. - Congested mucosa in the rectum, in the sigmoid colon and in the descending colon. - A few diminutive polyps in the rectum and in the sigmoid colon. No colon or rectal mass identified. - No specimens collected.    02/19/2018 Imaging    CT CAP IMPRESSION: 1. Slight interval increase in size of left adrenal gland metastasis. Similar-appearing right adrenal gland metastasis. 2. While difficult to measure given  ill-defined nature, infiltrative mass within the left hepatic lobe is grossly similar when compared to prior exam. Additional lesions within the liver are grossly similar. 3. Interval insertion of biliary drain within the left hepatic lobe with resultant decompression of the left hepatic lobe biliary system. 4. Similar-appearing sharply marginated consolidation within the right lower lobe which may represent infectious/inflammatory process or metastatic disease. 5. Large amount of stool throughout the colon as can be seen with constipation. 6. Persistent left hepatic lobe portal venous thrombosis. 7. Splenomegaly. 8. New moderate volume ascites.     Intrahepatic cholangiocarcinoma ([place])   10/27/2017 Imaging    10/27/2017 CT AP IMPRESSION: Changes of cirrhosis. Multiple hepatic lesions are noted concerning for multifocal hepatoma or metastases.  Biliary drain is in place with the tip in the distal common bile duct. There is mild intrahepatic biliary ductal dilatation.  Bilateral adrenal nodules and masses, right larger than left compatible with metastases.  Retroperitoneal adenopathy.  Large stool burden with mild gaseous distention of the colon.  Aortic atherosclerosis.    10/29/2017 Cancer Staging    Staging form: Intrahepatic Bile Duct, AJCC 8th Edition - Clinical stage from 10/29/2017: Stage IV (cT2, cN1, pM1) - Signed by [name], MD on 11/06/2017    10/29/2017 Pathology Results    10/29/2017 Surgical Pathology Diagnosis Soft Tissue Needle Core Biopsy, right adrenal mass - METASTATIC ADENOCARCINOMA, SEE COMMENT.    11/06/2017 Initial Diagnosis    Intrahepatic cholangiocarcinoma ([place])    11/06/2017 Imaging    11/06/2017 CT AP IMPRESSION: 1. Increase in RIGHT pleural effusion. Stable masslike consolidation at the RIGHT lung base representing neoplasm versus pneumonia. 2. Stable metastatic nodule at the RIGHT lung base. 3. Infiltrative mass in the central  LEFT hepatic lobe and discrete lesions in the RIGHT hepatic lobe consistent with metastatic disease versus primary hepatic biliary carcinoma. Favor cholangiocarcinoma. 4. Percutaneous drainage catheter extends the porta hepatis. Small trapped pneumothorax along the tract of the catheter at the inferolateral RIGHT lung base. 5. Stable adrenal metastasis. 6. Metastatic adenopathy in the retroperitoneum unchanged. 7. Morphologic changes in liver consistent with  cirrhosis.    11/08/2017 Imaging    11/08/2017 CT AP IMPRESSION: 1. Overall no significant interval change since the CT of 11/06/2017. Stable positioning of the percutaneous biliary drain with similar degree of biliary ductal dilatation. 2. Cirrhosis with multiple hepatic metastatic disease. Infiltrative appearing lesion in the left lobe of the liver appears to extend into the left portal vein and may represent a tumor thrombus, or bland thrombus. A primary biliary neoplasm (cholangiocarcinoma, or metastatic disease are other considerations. 3. Retroperitoneal, mesenteric, and adrenal metastatic disease. 4. Hepatic colopathy versus less likely segmental colitis of the hepatic flexure. 5. Small right pleural effusion and bilateral lung base pulmonary masses/nodules as seen previously. 6. Tiny pockets of pneumoperitoneum lateral to the liver along the percutaneous drain, decreased since the prior CT. 7. Other findings as above.    11/20/2017 - 11/24/2017 Hospital Admission    Admit date: 11/08/2017 Admission diagnosis: fatigue Additional comments: discharged 11/24/2017    12/18/2017 - 02/14/2018 Chemotherapy    first line chemo gemcitabine/cisplatin on days 1 and 15 every 28 days starting 12/18/17. Stopped 02/14/18 after cycle 3.     12/22/2017 - 12/30/2017 Hospital Admission    Admit date: 12/22/2017 Admission diagnosis: Dysuria    12/22/2017 Imaging    12/22/2017 CT AP IMPRESSION: 1. Prominently distended and otherwise  normal urinary bladder with mild prostatomegaly. Findings suggest acute bladder outlet obstruction. No hydronephrosis. 2.  Large colorectal stool volume compatible with constipation. New mild circumferential rectal wall thickening and perirectal fat haziness suggesting stercoral colitis. No free air. No abscess. 3. Stable infiltrative cholangiocarcinoma in the central left liver lobe with associated peripheral intrahepatic biliary ductal dilatation throughout the left liver lobe. 4. Stable well-positioned internal external drain traversing the right liver lobe and decompressing the right liver biliary system. 5. Stable liver, bilateral adrenal, retroperitoneal, left mesenteric and right infrahilar nodal and left lung base metastases. No progressive metastatic disease since 12/03/2017 CT. 6. Cirrhosis. Trace perihepatic ascites, decreased. Stable moderate splenomegaly. 7.  Aortic Atherosclerosis (ICD10-I70.0).    03/11/2018 -  Chemotherapy    Second lineFOLFOX every 2 weeks started 03/11/2018. Held after cycle 5 due to GI bleeding from Varices.     05/13/2018 Procedure    Upper Endoscopy by Dr Carlean Purl 05/13/18  IMPRESSION - Grade II and large (> 5 mm) esophageal varices. Completely eradicated. Banded. Nipples seen no visible bleeding but he was vomiting blood as could only lightly sedate due to shock. Was not intubated as he was DNR and declined intubation. Wife updateed by phone pre and post procedure - Red blood in the entire stomach. - No specimens collected.      CURRENT THERAPY:  Second lineFOLFOX every 2 weeks started 03/11/2018. Held after cycle 5 on 05/07/2018 due to GI bleeding from Varices.    INTERVAL HISTORY:  Matthew Lewis is here for a follow up after recent hospitalization. He presents to the clinic today by himself. He notes he has higher weight than normal on scale due to significant fluid collection. He notes his biliary draining about 1-2 L a day. He feels he is able  to eat adequately and drink plenty of fluids. He notes main pain is high lower back from where he fell constantly 8/10. He uses lidocaine patches but this is not helping his pain. He notes his energy is very low.      REVIEW OF SYSTEMS:   Constitutional: Denies fevers, chills or abnormal weight loss, very fatigued  Eyes: Denies blurriness of vision Ears, nose, mouth, throat, and face: Denies mucositis or sore throat Respiratory: Denies cough, dyspnea or wheezes Cardiovascular: Denies palpitation, chest discomfort (+) b/l  lower extremity swelling Gastrointestinal:  Denies nausea, heartburn or change in bowel habits (+) Significant abdominal bloating and ascites  MSK: (+) lower back pain from fall Skin: Denies abnormal skin rashes Lymphatics: Denies new lymphadenopathy or easy bruising Neurological:Denies numbness, tingling or new weaknesses Behavioral/Psych: Mood is stable, no new changes  All other systems were reviewed with the patient and are negative.  MEDICAL HISTORY:  Past Medical History:  Diagnosis Date  . Abscess of anal and rectal regions    horse shoe abscess  . Alcoholic cirrhosis ([place])   . Arthritis    "everywhere; especially in my spine"  . Cirrhosis ([place])    STAGE 1 CIRRHOSIS PATIENT SEES DR NAMDIGAM FOR  . Colon cancer ([place]) 05/2005   TX SURGERY WITH LYMPH NODE REMOVAL  . Cough    LAST FEW WEEKS SAW DR RAMOS LUNGS MILKY SPUTUM OCC  . DM type 2 with diabetic peripheral neuropathy ([facility])    left foot  . Fatigue   . Fistula, anal    multiple  . History of substance abuse ([place])    last alcohol was 08/2011; last marijuania was 08/2011  . Hypertension   . PTSD (post-traumatic stress disorder)    "severe" HX OF  . Sleep deprivation  SURGICAL HISTORY: Past Surgical History:  Procedure Laterality Date  . ABSCESS DRAINAGE     "probably 15-20 so far at least; rectal"  . ANAL FISTULECTOMY  12/13/1998  . ANTERIOR FUSION CERVICAL SPINE  2010   "triple"; "did 2  surgeries on the same day in [id number] due to complications"  . CORONARY ANGIOPLASTY  2001  . detached muscle  2010   right chest; "after cervical fusion complications"  . ELBOW SURGERY  2007   "cut out part of a muscle"; left  . ESOPHAGEAL BANDING  02/25/2018   Procedure: ESOPHAGEAL BANDING;  Surgeon: Ladene Artist, MD;  Location: [facility] ENDOSCOPY;  Service: Endoscopy;;  . ESOPHAGEAL BANDING  05/12/2018   Procedure: ESOPHAGEAL BANDING;  Surgeon: Gatha Mayer, MD;  Location: [facility] ENDOSCOPY;  Service: Endoscopy;;  . ESOPHAGEAL BANDING  05/14/2018   Procedure: ESOPHAGEAL BANDING;  Surgeon: Yetta Flock, MD;  Location: [facility] ENDOSCOPY;  Service: Gastroenterology;;  . ESOPHAGOGASTRODUODENOSCOPY N/A 05/12/2018   Procedure: ESOPHAGOGASTRODUODENOSCOPY (EGD);  Surgeon: Gatha Mayer, MD;  Location: [facility] ENDOSCOPY;  Service: Endoscopy;  Laterality: N/A;  . ESOPHAGOGASTRODUODENOSCOPY (EGD) WITH PROPOFOL N/A 02/25/2018   Procedure: ESOPHAGOGASTRODUODENOSCOPY (EGD) WITH PROPOFOL;  Surgeon: Ladene Artist, MD;  Location[facility] ENDOSCOPY;  Service: Endoscopy;  Laterality: N/A;  . ESOPHAGOGASTRODUODENOSCOPY (EGD) WITH PROPOFOL N/A 05/14/2018   Procedure: ESOPHAGOGASTRODUODENOSCOPY (EGD) WITH PROPOFOL;  Surgeon: Yetta Flock, MD;  Location: [facility] ENDOSCOPY;  Service: Gastroenterology;  Laterality: N/A;  . EXAMINATION UNDER ANESTHESIA  03/22/2005   fistula  . EXAMINATION UNDER ANESTHESIA  06/06/2011   Procedure: EXAM UNDER ANESTHESIA;  Surgeon: Stark Klein, MD;  Location: [place];  Service: General;  Laterality: N/A;  . HEMICOLECTOMY  06/06/2005   left  . INCISION AND DRAINAGE PERIRECTAL ABSCESS  03/30/2005  . INCISION AND DRAINAGE PERIRECTAL ABSCESS  12/13/2004  . INCISION AND DRAINAGE PERIRECTAL ABSCESS  07/14/2001  . INCISION AND DRAINAGE PERIRECTAL ABSCESS  08/12/2010   horseshoe abscess; Dr Redmond Pulling  . INCISION AND DRAINAGE PERIRECTAL ABSCESS  06/06/2011   Procedure: IRRIGATION AND DEBRIDEMENT PERIRECTAL ABSCESS;   Surgeon: Stark Klein, MD;  Location: [place];  Service: General;  Laterality: N/A;  . INCISION AND DRAINAGE PERIRECTAL ABSCESS N/A 02/24/2017   Procedure: IRRIGATION AND DEBRIDEMENT PERIRECTAL ABSCESS;  Surgeon: Ralene Ok, MD;  Location: [place];  Service: General;  Laterality: N/A;  . IR CATHETER TUBE CHANGE  02/19/2018  . IR CHOLANGIOGRAM EXISTING TUBE  12/23/2017  . IR CHOLANGIOGRAM EXISTING TUBE  01/02/2018  . IR CHOLANGIOGRAM EXISTING TUBE  05/04/2018  . IR EXCHANGE BILIARY DRAIN  11/07/2017  . IR EXCHANGE BILIARY DRAIN  12/09/2017  . IR EXCHANGE BILIARY DRAIN  02/03/2018  . IR EXCHANGE BILIARY DRAIN  02/03/2018  . IR EXCHANGE BILIARY DRAIN  04/03/2018  . IR EXCHANGE BILIARY DRAIN  04/03/2018  . IR EXCHANGE BILIARY DRAIN  04/23/2018  . IR EXCHANGE BILIARY DRAIN  04/28/2018  . IR EXCHANGE BILIARY DRAIN  04/28/2018  . IR FLUORO RM 30-60 MIN  04/15/2018  . IR IMAGING GUIDED PORT INSERTION  12/09/2017  . IR INT EXT BILIARY DRAIN WITH CHOLANGIOGRAM  10/26/2017  . IR INT EXT BILIARY DRAIN WITH CHOLANGIOGRAM  12/23/2017  . IR PARACENTESIS  02/25/2018  . IR PATIENT EVAL TECH 0-60 MINS  12/02/2017  . IR RADIOLOGIST EVAL & MGMT  11/06/2017  . IR RADIOLOGIST EVAL & MGMT  01/05/2018  . [place]  . PERCUTANEOUS PINNING PHALANX FRACTURE OF HAND  ~ 2008   "  plates in 2 places"; right  . PLACEMENT OF SETON N/A 09/05/2017   Procedure: PLACEMENT OF SETON;  Surgeon: Leighton Ruff, MD;  Location: [facility];  Service: General;  Laterality: N/A;  . RADIOLOGY WITH ANESTHESIA N/A 10/25/2017   Procedure: MRI WITH ANESTHESIA;  Surgeon: Radiologist, Medication, MD;  Location: [place];  Service: Radiology;  Laterality: N/A;  . THORACIC DISCECTOMY  1990's    I have reviewed the social history and family history with the patient and they are unchanged from previous note.  ALLERGIES:  is allergic to morphine and related.  MEDICATIONS:  Current Outpatient Medications  Medication Sig  Dispense Refill  . Ca Carbonate-Mag Hydroxide (ROLAIDS PO) Take 1-2 tablets by mouth 2 (two) times daily as needed (heartburn).    . cetirizine (ZYRTEC) 10 MG tablet Take 10 mg by mouth at bedtime.    . DULoxetine (CYMBALTA) 30 MG capsule Take 30 mg by mouth 2 (two) times daily.    . fexofenadine (ALLEGRA) 180 MG tablet Take 180 mg by mouth daily after breakfast.     . gabapentin (NEURONTIN) 400 MG capsule Take 400-800 mg by mouth See admin instructions. Take 400 mg by mouth in the morning and 800 mg in the evening    . insulin detemir (LEVEMIR) 100 UNIT/ML injection Inject 10-62 Units into the skin See admin instructions. Take 62 in the am and 10 in the pm    . lidocaine (LIDODERM) 5 % Place 1 patch onto the skin daily. Remove & Discard patch within 12 hours or as directed by MD 30 patch 0  . lidocaine-prilocaine (EMLA) cream Apply to port site 1 hour prior to access (Patient taking differently: Apply 1 application topically once. Apply to port site 1 hour prior to access) 30 g 0  . Multiple Vitamin (MULTIVITAMIN) tablet Take 1 tablet by mouth daily.    . pantoprazole (PROTONIX) 40 MG tablet Take 1 tablet (40 mg total) by mouth daily at 6 (six) AM. 30 tablet 0  . polyethylene glycol (MIRALAX / GLYCOLAX) 17 g packet Take 17 g by mouth daily as needed for mild constipation. 14 each 0  . Sodium Chloride Flush (NORMAL SALINE FLUSH) 0.9 % SOLN Inject 10 mLs into the vein daily. 30 Syringe 2  . tamsulosin (FLOMAX) 0.4 MG CAPS capsule TAKE 1 CAPSULE(0.4 MG) BY MOUTH DAILY AFTER SUPPER (Patient taking differently: Take 0.4 mg by mouth daily after supper. ) 90 capsule 0  . traMADol (ULTRAM) 50 MG tablet Take 1 tablet (50 mg total) by mouth every 6 (six) hours as needed. 30 tablet 0   Current Facility-Administered Medications  Medication Dose Route Frequency Provider Last Rate Last Dose  . 0.9 %  sodium chloride infusion   Intravenous Continuous [name], MD   Stopped at 05/21/18 1036    Facility-Administered Medications Ordered in Other Visits  Medication Dose Route Frequency Provider Last Rate Last Dose  . albumin human 25 % solution 12.5 g  12.5 g Intravenous Once [name], MD      . heparin lock flush 100 unit/mL  500 Units Intracatheter Once PRN [name], MD      . lidocaine (XYLOCAINE) 1 % (with pres) injection           . sodium chloride flush (NS) 0.9 % injection 10 mL  10 mL Intracatheter PRN [name], MD        PHYSICAL EXAMINATION: ECOG PERFORMANCE STATUS: 3 - Symptomatic, >50% confined to bed  Vitals:  05/21/18 0925 05/21/18 0926  BP: 103/69 (!) 97/59  Pulse: (!) 107 (!) 115  Resp:    Temp:    SpO2: 100% 100%   Filed Weights   05/21/18 0902  Weight: 226 lb 12.8 oz (102.9 kg)    GENERAL:alert, no distress and comfortable SKIN: skin color, texture, turgor are normal, no rashes or significant lesions EYES: normal, Conjunctiva are pink and non-injected, sclera clear OROPHARYNX:no exudate, no erythema and lips, buccal mucosa, and tongue normal  NECK: supple, thyroid normal size, non-tender, without nodularity LYMPH:  no palpable lymphadenopathy in the cervical, axillary or inguinal LUNGS: clear to and percussion with normal breathing effort (+) Mild crackling at base of lungs  HEART: regular rate & rhythm and no murmurs (+) Significant b/l lower extremity edema ABDOMEN:abdomen non-tender and normal bowel sounds (+) Significant abdominal distention, abdominal ascites  Musculoskeletal:no cyanosis of digits and no clubbing  NEURO: alert & oriented x 3 with fluent speech, no focal motor/sensory deficits  LABORATORY DATA:  I have reviewed the data as listed CBC Latest Ref Rng & Units 05/21/2018 05/17/2018 05/16/2018  WBC 4.0 - 10.5 K/uL 15.4(H) 34.1(H) -  Hemoglobin 13.0 - 17.0 g/dL 9.0(L) 8.8(L) 9.0(L)  Hematocrit 39.0 - 52.0 % 27.1(L) 27.3(L) 27.5(L)  Platelets 150 - 400 K/uL 138(L) 129(L) -     CMP Latest Ref Rng & Units 05/21/2018 05/16/2018 05/15/2018   Glucose 70 - 99 mg/dL 75 208(H) 205(H)  BUN 8 - 23 mg/dL 13 20 27(H)  Creatinine 0.61 - 1.24 mg/dL 0.64 0.63 0.62  Sodium 135 - 145 mmol/L 131(L) 136 132(L)  Potassium 3.5 - 5.1 mmol/L 4.0 4.1 4.1  Chloride 98 - 111 mmol/L 101 104 106  CO2 22 - 32 mmol/L 23 23 20(L)  Calcium 8.9 - 10.3 mg/dL 7.9(L) 8.1(L) 8.0(L)  Total Protein 6.5 - 8.1 g/dL 6.1(L) - -  Total Bilirubin 0.3 - 1.2 mg/dL 2.7(H) - -  Alkaline Phos 38 - 126 U/L 270(H) - -  AST 15 - 41 U/L 31 - -  ALT 0 - 44 U/L 31 - -    RADIOGRAPHIC STUDIES: I have personally reviewed the radiological images as listed and agreed with the findings in the report. No results found.   ASSESSMENT & PLAN:  Matthew Lewis is a 63 y.o. male with   1. Metastaticintrahepatic cholangiocarcinomato right adrenal gland, liver, lungs and nodes -Diagnosed in 10/2017. He had 2 biliary drainage placements. His cancer treatment was postponed due to multiple hospitalization for infection, dehydration, it etc. Hewasonfirst lineCisplatin and gemcitabinestopped on 02/20/2018 because his restaging CT scan showed enlarged adrenal glad metastasis consistent disease progression. -He has had multiplehospitalizations since his diagnosis of cancer forinfection, dehydration and GI bleeding -He startedsecond linetreatmentFOLFOXon 03/11/2018 and was only given5-FU and leucovorin on for first cycle due to recent GI bleeding and will continue treatment every 2 weeks.And then Oxaliplatin was added with cycle 2. -Chemo held after cycle 5 (05/07/18) due to severe GI bleeding from Varices. I discussed there is a possibility not restart chemo given his overall decline from liver cirrhosis, poor performance status and because chemo can increase his risk for significant bleeding at this point.  -I discussed as we continue to hold chemotherapy he is eligible for hospice care, especially at home to help his symptoms. He is interested.  -Exam today showed significant  ascites and b/l LE edema. Will set up Paracentesis today and send sample for cytology. He also has crackles at base of lungs likely secondary  to ascites. I encouraged him to practice deep breathing to open up his lungs. -Labs reviewed, CBC WNL except WBC 15.4, HG 9, PLT 138K, ANC 12.7, CMP WNL except low calcium and protein level. Ca 19.9 still pending.  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 GI bleeding has currently stopped.  -He has significant abdominal ascites on exam today with significant increase in weight. I will set up paracentesis and cytology of fluid. He is agreeable.  -I discussed his ascites has   3. GIbleeding fromduodenal ulcers and Varices -Patient developed upper GI bleeding on February 24, 2018, required hospitalization, and multiple blood transfusion. -Bleeding has stopped and is possibly related to NSAIDs  -Continue Protonix twice daily indefinitelyper GI Dr. Fuller Plan  -He had severe upper GI bleeding from varices which required urgent banding on 05/13/18 and blood transfusions. Bleeding has stopped since.  -He will follow up with Dr. Silverio Decamp for likely second banding to prevent more bleeding.    4.Obstructive jaundice, status post bilateral percutaneous biliary drain  -drained tube exchange1/28, both were capped. Due to bilary leakage from left side this morning, the left drain tube is connected to a bag now,it is draining well, and his output has decreased. -f/u with IR -continue monitoring LFTs -He is only mildly jaundiced. He recently had right biliary drain replaced but he continues to have high output leading to persistent leaking at site. This causes him skin irritation. I encouraged him to contact IR as he may need a stitch placed to better hold drain. He is agreeable.  -I will continue to give IV Fluids as  needed, given high output.  -I encouraged him to drink 3 liters a day of fluid. He is agreeable. -He is currently draining 1-2L daily. Left drain still leaking slightly, continue to monitor.    4. Portal vein thrombosis -He is on aspirin, no anticoagulation due toinitialbloody biliary drainage and recent severe varices bleeding   5. Lower Back pain  -secondary from recent fall  -MRI spine was negative for fracture -I will call in Tramadol to help control his pain which is currently 8/10. He is agreeable.  -I discussed pain medication can cause constipation and encouraged him to use stool softeners or laxatives as needed.   6. DM, HTN -f/u with PCP  7.H/o substance abuse and addiction -He's been clean for 6 years now -will becautious about narcoticsandbenzo.  8. Anemia  -Secondary to chemotherapy, recent GI bleedingand underlying malignancy -He received 5 unit blood transfusion during his hospitalization for GI bleeding, anemia improved. -Hg9 today, no need for blood transfusion. (05/21/18)  9.Goal of care discussion, DNR/DNI -Wepreviouslydiscussed code status in length.I recommended DNR/DNI,he agrees. -He understands his cancer is not curable, and the goal of therapy is to prolong his life and improve his quality of life.   12.Dizziness with falls, orthostatic hypotension -SBP previously dropped to 80's  -likely dehydration from high biliary output, was given IVFs.  -BP at 123/83 today (05/21/18), he notes being dizzy lately. He was slightly orthostatic, will give IV Fluids and Albumin today.   Plan -I called in Tramadol today  -Cancell chemo today  -US abdomen, Paracentesis today  -CT CAP on 5/26 schedule  -NS 511m with albumin 25g today around his paracentesis  -lab, flush and f/u in one week    No problem-specific Assessment &  Plan notes found for this encounter.   Orders Placed This Encounter  Procedures  . US Paracentesis     Standing Status:   Future    Number of Occurrences:   1    Standing Expiration Date:   05/21/2019    Order Specific Question:   If therapeutic, is there a maximum amount of fluid to be removed?    Answer:   No    Order Specific Question:   Are labs required for specimen collection?    Answer:   No    Order Specific Question:   Is Albumin medication needed?    Answer:   No    Order Specific Question:   Reason for Exam (SYMPTOM  OR DIAGNOSIS REQUIRED)    Answer:   ascites    Order Specific Question:   Preferred imaging location?    Answer:   [facility]   All questions were answered. The patient knows to call the clinic with any problems, questions or concerns. No barriers to learning was detected. I spent 20 minutes counseling the patient face to face. The total time spent in the appointment was 25 minutes and more than 50% was on counseling and review of test results     [name], MD 05/21/2018   I, Joslyn Devon, am acting as scribe for [name], MD.   I have reviewed the above documentation for accuracy and completeness, and I agree with the above.

## 2018-05-19 ENCOUNTER — Other Ambulatory Visit: Payer: Self-pay | Admitting: *Deleted

## 2018-05-19 NOTE — Patient Outreach (Signed)
[facility]) Care Management  05/19/2018  TREI SCHOCH Nov 19, 1955 [id number]   Referral received from inpatient case manager as member was recently discharged on 5/10 after being admitted for syncope & collapse.  Primary MD office will complete transition of care assessment.  Per chart, he has history of hypertension, CAD, alcoholic cirrhosis of the liver, diabetes, hyperlipidemia, and metastatic cholangiocarcinoma.    Call placed to member for telephone assessment, no answer.  HIPAA compliant voice message left.  Unsuccessful outreach letter sent, will follow up within the next 3-4 business days.  Valente David, [place], MSN [place] [phone]

## 2018-05-21 ENCOUNTER — Inpatient Hospital Stay: Payer: Medicare Other

## 2018-05-21 ENCOUNTER — Encounter: Payer: Self-pay | Admitting: Hematology

## 2018-05-21 ENCOUNTER — Ambulatory Visit (HOSPITAL_COMMUNITY)
Admission: RE | Admit: 2018-05-21 | Discharge: 2018-05-21 | Disposition: A | Discharge status: Home / Self Care | Payer: Medicare Other | Source: Ambulatory Visit | Attending: Hematology | Admitting: Hematology

## 2018-05-21 ENCOUNTER — Other Ambulatory Visit: Payer: Self-pay

## 2018-05-21 ENCOUNTER — Inpatient Hospital Stay (HOSPITAL_BASED_OUTPATIENT_CLINIC_OR_DEPARTMENT_OTHER): Payer: Medicare Other | Admitting: Hematology

## 2018-05-21 VITALS — BP 108/66 | HR 97 | Temp 98.6°F | Resp 16

## 2018-05-21 VITALS — BP 97/59 | HR 115 | Temp 98.3°F | Resp 17 | Ht 71.0 in | Wt 226.8 lb

## 2018-05-21 DIAGNOSIS — C221 Intrahepatic bile duct carcinoma: Secondary | ICD-10-CM | POA: Insufficient documentation

## 2018-05-21 DIAGNOSIS — C772 Secondary and unspecified malignant neoplasm of intra-abdominal lymph nodes: Secondary | ICD-10-CM | POA: Insufficient documentation

## 2018-05-21 DIAGNOSIS — R140 Abdominal distension (gaseous): Secondary | ICD-10-CM | POA: Diagnosis not present

## 2018-05-21 DIAGNOSIS — C7801 Secondary malignant neoplasm of right lung: Secondary | ICD-10-CM | POA: Diagnosis not present

## 2018-05-21 DIAGNOSIS — D649 Anemia, unspecified: Secondary | ICD-10-CM | POA: Insufficient documentation

## 2018-05-21 DIAGNOSIS — C7970 Secondary malignant neoplasm of unspecified adrenal gland: Secondary | ICD-10-CM | POA: Diagnosis not present

## 2018-05-21 DIAGNOSIS — Z794 Long term (current) use of insulin: Secondary | ICD-10-CM

## 2018-05-21 DIAGNOSIS — I10 Essential (primary) hypertension: Secondary | ICD-10-CM

## 2018-05-21 DIAGNOSIS — K7030 Alcoholic cirrhosis of liver without ascites: Secondary | ICD-10-CM

## 2018-05-21 DIAGNOSIS — E119 Type 2 diabetes mellitus without complications: Secondary | ICD-10-CM

## 2018-05-21 DIAGNOSIS — C7971 Secondary malignant neoplasm of right adrenal gland: Secondary | ICD-10-CM | POA: Insufficient documentation

## 2018-05-21 DIAGNOSIS — C787 Secondary malignant neoplasm of liver and intrahepatic bile duct: Secondary | ICD-10-CM | POA: Insufficient documentation

## 2018-05-21 DIAGNOSIS — K7031 Alcoholic cirrhosis of liver with ascites: Secondary | ICD-10-CM | POA: Insufficient documentation

## 2018-05-21 DIAGNOSIS — Z79899 Other long term (current) drug therapy: Secondary | ICD-10-CM | POA: Diagnosis not present

## 2018-05-21 DIAGNOSIS — Z95828 Presence of other vascular implants and grafts: Secondary | ICD-10-CM

## 2018-05-21 DIAGNOSIS — Z7189 Other specified counseling: Secondary | ICD-10-CM

## 2018-05-21 DIAGNOSIS — C7802 Secondary malignant neoplasm of left lung: Secondary | ICD-10-CM | POA: Diagnosis not present

## 2018-05-21 DIAGNOSIS — C7972 Secondary malignant neoplasm of left adrenal gland: Secondary | ICD-10-CM | POA: Insufficient documentation

## 2018-05-21 DIAGNOSIS — R188 Other ascites: Secondary | ICD-10-CM | POA: Diagnosis not present

## 2018-05-21 DIAGNOSIS — R0602 Shortness of breath: Secondary | ICD-10-CM | POA: Diagnosis not present

## 2018-05-21 LAB — CMP (CANCER CENTER ONLY)
ALT: 31 U/L (ref 0–44)
AST: 31 U/L (ref 15–41)
Albumin: 2.2 g/dL — ABNORMAL LOW (ref 3.5–5.0)
Alkaline Phosphatase: 270 U/L — ABNORMAL HIGH (ref 38–126)
Anion gap: 7 (ref 5–15)
BUN: 13 mg/dL (ref 8–23)
CO2: 23 mmol/L (ref 22–32)
Calcium: 7.9 mg/dL — ABNORMAL LOW (ref 8.9–10.3)
Chloride: 101 mmol/L (ref 98–111)
Creatinine: 0.64 mg/dL (ref 0.61–1.24)
GFR, Est AFR Am: >60 mL/min (ref >60)
GFR, Estimated: >60 mL/min (ref >60)
Glucose, Bld: 75 mg/dL (ref 70–99)
Potassium: 4 mmol/L (ref 3.5–5.1)
Sodium: 131 mmol/L — ABNORMAL LOW (ref 135–145)
Total Bilirubin: 2.7 mg/dL — ABNORMAL HIGH (ref 0.3–1.2)
Total Protein: 6.1 g/dL — ABNORMAL LOW (ref 6.5–8.1)

## 2018-05-21 LAB — CBC WITH DIFFERENTIAL (CANCER CENTER ONLY)
Abs Immature Granulocytes: 0.16 10*3/uL — ABNORMAL HIGH (ref 0.00–0.07)
Basophils Absolute: 0 10*3/uL (ref 0.0–0.1)
Basophils Relative: 0 %
Eosinophils Absolute: 0.2 10*3/uL (ref 0.0–0.5)
Eosinophils Relative: 1 %
HCT: 27.1 % — ABNORMAL LOW (ref 39.0–52.0)
Hemoglobin: 9 g/dL — ABNORMAL LOW (ref 13.0–17.0)
Immature Granulocytes: 1 %
Lymphocytes Relative: 7 %
Lymphs Abs: 1 10*3/uL (ref 0.7–4.0)
MCH: 30.9 pg (ref 26.0–34.0)
MCHC: 33.2 g/dL (ref 30.0–36.0)
MCV: 93.1 fL (ref 80.0–100.0)
Monocytes Absolute: 1.4 10*3/uL — ABNORMAL HIGH (ref 0.1–1.0)
Monocytes Relative: 9 %
Neutro Abs: 12.7 10*3/uL — ABNORMAL HIGH (ref 1.7–7.7)
Neutrophils Relative %: 82 %
Platelet Count: 138 10*3/uL — ABNORMAL LOW (ref 150–400)
RBC: 2.91 MIL/uL — ABNORMAL LOW (ref 4.22–5.81)
RDW: 18 % — ABNORMAL HIGH (ref 11.5–15.5)
WBC Count: 15.4 10*3/uL — ABNORMAL HIGH (ref 4.0–10.5)
nRBC: 0 % (ref 0.0–0.2)

## 2018-05-21 LAB — SAMPLE TO BLOOD BANK

## 2018-05-21 MED ORDER — SODIUM CHLORIDE 0.9% FLUSH
10.0000 mL | INTRAVENOUS | Status: DC | PRN
  Administered 2018-05-21: 10 mL
  Filled 2018-05-21: qty 10

## 2018-05-21 MED ORDER — ALBUMIN HUMAN 25 % IV SOLN
25.0000 g | Freq: Once | INTRAVENOUS | Status: AC
  Administered 2018-05-21: 25 g via INTRAVENOUS
  Filled 2018-05-21: qty 100

## 2018-05-21 MED ORDER — LIDOCAINE HCL 1 % IJ SOLN
INTRAMUSCULAR | Status: AC
  Filled 2018-05-21: qty 20

## 2018-05-21 MED ORDER — SODIUM CHLORIDE 0.9 % IV SOLN
INTRAVENOUS | Status: DC
  Administered 2018-05-21: 10:00:00 via INTRAVENOUS
  Filled 2018-05-21 (×2): qty 250

## 2018-05-21 MED ORDER — TRAMADOL HCL 50 MG PO TABS: 50.0000 mg | ORAL_TABLET | Freq: Four times a day (QID) | ORAL | 0 refills | Status: DC | PRN

## 2018-05-21 MED ORDER — HEPARIN SOD (PORK) LOCK FLUSH 100 UNIT/ML IV SOLN
500.0000 [IU] | Freq: Once | INTRAVENOUS | Status: AC | PRN
  Administered 2018-05-21: 500 [IU]
  Filled 2018-05-21: qty 5

## 2018-05-21 NOTE — Progress Notes (Signed)
Per [name], will increase Albumin to 25 g (100 mL) due to large amt of fluid withdrawn w/ paracentesis. Kennith Center, Pharm.D., CPP 05/21/2018@1 :04 PM

## 2018-05-21 NOTE — Procedures (Signed)
PROCEDURE SUMMARY:  Successful US guided paracentesis from right lateral abdomen.  Yielded 3.9 liters of yellow fluid.  No immediate complications.  Pt tolerated well.   Specimen was sent for labs.  EBL < 20mL  Docia Barrier PA-C 05/21/2018 11:50 AM

## 2018-05-22 ENCOUNTER — Telehealth: Payer: Self-pay | Admitting: Hematology

## 2018-05-22 ENCOUNTER — Other Ambulatory Visit: Payer: Self-pay | Admitting: *Deleted

## 2018-05-22 DIAGNOSIS — I10 Essential (primary) hypertension: Secondary | ICD-10-CM | POA: Diagnosis not present

## 2018-05-22 LAB — CANCER ANTIGEN 19-9: CA 19-9: 29 U/mL (ref 0–35)

## 2018-05-22 NOTE — Patient Outreach (Signed)
[facility]) Care Management  05/22/2018  MELANIE PELLOT 1955-12-31 [id number]   Second attempt made to contact member for telephone assessment, no answer.  HIPAA compliant voice message left.  Will follow up within the next 3 business days.  Valente David, [place], MSN [place] [phone]

## 2018-05-22 NOTE — Telephone Encounter (Signed)
Scheduled appt per 5/14 los.  Patient aware of appt date and time.

## 2018-05-23 ENCOUNTER — Inpatient Hospital Stay: Payer: Medicare Other

## 2018-05-25 ENCOUNTER — Inpatient Hospital Stay: Payer: Medicare Other | Admitting: Nutrition

## 2018-05-25 DIAGNOSIS — K603 Anal fistula: Secondary | ICD-10-CM | POA: Diagnosis not present

## 2018-05-25 NOTE — Progress Notes (Signed)
RD working remotely.  Patient reports his chemotherapy has been held.  MD reports holding due to severe GIB from [place]. He has declining liver cirrhosis and poor performance status.  He is going to [facility] to see "what they can do for me". Reports he is eating. Weight increased secondary to fluid accumulation. Requests ensure coupons. He is hospice eligible.  Nutrition Diagnosis: Unintended weight loss cannot be evaluated.  Intervention: Provided support and encouragement for increased calories, protein and fluid as tolerated. Mail Ensure Coupons. Encouraged patient to contact me with questions.  Monitoring, Evaluation, Goals: Increase calories and protein for increased QOL.  Next Visit: I am available as needed.

## 2018-05-26 ENCOUNTER — Telehealth: Payer: Self-pay | Admitting: *Deleted

## 2018-05-26 ENCOUNTER — Other Ambulatory Visit: Payer: Self-pay | Admitting: *Deleted

## 2018-05-26 ENCOUNTER — Ambulatory Visit (HOSPITAL_COMMUNITY)
Admission: RE | Admit: 2018-05-26 | Discharge: 2018-05-26 | Disposition: A | Discharge status: Home / Self Care | Payer: Medicare Other | Source: Ambulatory Visit | Attending: Nurse Practitioner | Admitting: Nurse Practitioner

## 2018-05-26 ENCOUNTER — Inpatient Hospital Stay: Payer: Medicare Other

## 2018-05-26 ENCOUNTER — Other Ambulatory Visit: Payer: Self-pay

## 2018-05-26 ENCOUNTER — Encounter: Payer: Self-pay | Admitting: Nurse Practitioner

## 2018-05-26 ENCOUNTER — Inpatient Hospital Stay (HOSPITAL_BASED_OUTPATIENT_CLINIC_OR_DEPARTMENT_OTHER): Payer: Medicare Other | Admitting: Nurse Practitioner

## 2018-05-26 ENCOUNTER — Telehealth: Payer: Self-pay | Admitting: Nurse Practitioner

## 2018-05-26 VITALS — BP 133/88 | HR 112 | Temp 98.2°F | Resp 18 | Ht 71.0 in | Wt 217.2 lb

## 2018-05-26 DIAGNOSIS — C7802 Secondary malignant neoplasm of left lung: Secondary | ICD-10-CM | POA: Diagnosis not present

## 2018-05-26 DIAGNOSIS — R0602 Shortness of breath: Secondary | ICD-10-CM | POA: Diagnosis not present

## 2018-05-26 DIAGNOSIS — C7801 Secondary malignant neoplasm of right lung: Secondary | ICD-10-CM | POA: Diagnosis not present

## 2018-05-26 DIAGNOSIS — C787 Secondary malignant neoplasm of liver and intrahepatic bile duct: Secondary | ICD-10-CM | POA: Diagnosis not present

## 2018-05-26 DIAGNOSIS — D649 Anemia, unspecified: Secondary | ICD-10-CM | POA: Diagnosis not present

## 2018-05-26 DIAGNOSIS — C221 Intrahepatic bile duct carcinoma: Secondary | ICD-10-CM | POA: Diagnosis not present

## 2018-05-26 DIAGNOSIS — R188 Other ascites: Secondary | ICD-10-CM | POA: Diagnosis not present

## 2018-05-26 DIAGNOSIS — C7971 Secondary malignant neoplasm of right adrenal gland: Secondary | ICD-10-CM

## 2018-05-26 DIAGNOSIS — R140 Abdominal distension (gaseous): Secondary | ICD-10-CM | POA: Diagnosis not present

## 2018-05-26 DIAGNOSIS — C7972 Secondary malignant neoplasm of left adrenal gland: Secondary | ICD-10-CM | POA: Diagnosis not present

## 2018-05-26 DIAGNOSIS — I10 Essential (primary) hypertension: Secondary | ICD-10-CM | POA: Diagnosis not present

## 2018-05-26 DIAGNOSIS — E119 Type 2 diabetes mellitus without complications: Secondary | ICD-10-CM

## 2018-05-26 DIAGNOSIS — C772 Secondary and unspecified malignant neoplasm of intra-abdominal lymph nodes: Secondary | ICD-10-CM | POA: Diagnosis not present

## 2018-05-26 DIAGNOSIS — Z79899 Other long term (current) drug therapy: Secondary | ICD-10-CM | POA: Diagnosis not present

## 2018-05-26 DIAGNOSIS — Z794 Long term (current) use of insulin: Secondary | ICD-10-CM | POA: Diagnosis not present

## 2018-05-26 HISTORY — PX: IR PARACENTESIS: IMG2679

## 2018-05-26 LAB — CMP (CANCER CENTER ONLY)
ALT: 26 U/L (ref 0–44)
AST: 27 U/L (ref 15–41)
Albumin: 2.2 g/dL — ABNORMAL LOW (ref 3.5–5.0)
Alkaline Phosphatase: 308 U/L — ABNORMAL HIGH (ref 38–126)
Anion gap: 8 (ref 5–15)
BUN: 8 mg/dL (ref 8–23)
CO2: 23 mmol/L (ref 22–32)
Calcium: 8.4 mg/dL — ABNORMAL LOW (ref 8.9–10.3)
Chloride: 101 mmol/L (ref 98–111)
Creatinine: 0.77 mg/dL (ref 0.61–1.24)
GFR, Est AFR Am: >60 mL/min (ref >60)
GFR, Estimated: >60 mL/min (ref >60)
Glucose, Bld: 215 mg/dL — ABNORMAL HIGH (ref 70–99)
Potassium: 3.8 mmol/L (ref 3.5–5.1)
Sodium: 132 mmol/L — ABNORMAL LOW (ref 135–145)
Total Bilirubin: 1.8 mg/dL — ABNORMAL HIGH (ref 0.3–1.2)
Total Protein: 7 g/dL (ref 6.5–8.1)

## 2018-05-26 LAB — CBC WITH DIFFERENTIAL (CANCER CENTER ONLY)
Abs Immature Granulocytes: 0.07 10*3/uL (ref 0.00–0.07)
Basophils Absolute: 0 10*3/uL (ref 0.0–0.1)
Basophils Relative: 0 %
Eosinophils Absolute: 0.1 10*3/uL (ref 0.0–0.5)
Eosinophils Relative: 1 %
HCT: 29 % — ABNORMAL LOW (ref 39.0–52.0)
Hemoglobin: 9.1 g/dL — ABNORMAL LOW (ref 13.0–17.0)
Immature Granulocytes: 1 %
Lymphocytes Relative: 11 %
Lymphs Abs: 1.1 10*3/uL (ref 0.7–4.0)
MCH: 29.9 pg (ref 26.0–34.0)
MCHC: 31.4 g/dL (ref 30.0–36.0)
MCV: 95.4 fL (ref 80.0–100.0)
Monocytes Absolute: 0.8 10*3/uL (ref 0.1–1.0)
Monocytes Relative: 8 %
Neutro Abs: 7.9 10*3/uL — ABNORMAL HIGH (ref 1.7–7.7)
Neutrophils Relative %: 79 %
Platelet Count: 181 10*3/uL (ref 150–400)
RBC: 3.04 MIL/uL — ABNORMAL LOW (ref 4.22–5.81)
RDW: 18.3 % — ABNORMAL HIGH (ref 11.5–15.5)
WBC Count: 10 10*3/uL (ref 4.0–10.5)
nRBC: 0 % (ref 0.0–0.2)

## 2018-05-26 MED ORDER — LIDOCAINE HCL 1 % IJ SOLN
INTRAMUSCULAR | Status: DC | PRN
  Administered 2018-05-26: 10 mL

## 2018-05-26 MED ORDER — LIDOCAINE HCL 1 % IJ SOLN
INTRAMUSCULAR | Status: AC
  Filled 2018-05-26: qty 20

## 2018-05-26 MED ORDER — DIPHENOXYLATE-ATROPINE 2.5-0.025 MG PO TABS: 1.0000 | ORAL_TABLET | Freq: Four times a day (QID) | ORAL | 0 refills | Status: DC | PRN

## 2018-05-26 NOTE — Procedures (Signed)
PROCEDURE SUMMARY:  Successful image-guided paracentesis from the left lower abdomen.  Yielded 5 liters of clear yellow fluid.  No immediate complications.  EBL = 5 mL. Patient tolerated well.   Specimen was sent for labs.  Earley Abide PA-C 05/26/2018 2:41 PM

## 2018-05-26 NOTE — Telephone Encounter (Signed)
I referred him for paracentesis today, he declined indwelling catheter. He will see you 5/21 as scheduled.  Thanks, Regan Rakers

## 2018-05-26 NOTE — Patient Outreach (Addendum)
[facility]) Care Management  05/26/2018  EAVEN SCHWAGER [birth year] [id number]   Third consecutive unsuccessful outreach attempt.  HIPAA compliant voice message left.  Will await call back.  If no call back by 5/26 will close case due to inability to establish contact.     Update @ 1615:   Call received back from member.  [facility] care management services explained.  He report he is doing well at the moment caring for himself with family support.  State he has stopped his cancer treatments as he is in the final stages of cirrhosis and was advised to enroll in hospice.  He has not enrolled in program yet, state he is looking at 2nd opinions at [facility] and [facility].  Has conference call next week to see if he is eligible for research studies, also hope to be eligible to get on transplant list.  This care manager inquired about palliative care involvement, declines at this time.  State he has a friend that works at [employer] and has information on both hospice and palliative care programs.    Declines the need for [facility] involvement at this time, receptive to information in the mail.  State he will contact [facility] if/when needs change.  Will not open at this time, will send successful outreach letter with this care manager's contact information.     Valente David, [place], MSN [place] [phone]

## 2018-05-26 NOTE — Telephone Encounter (Signed)
Received vm call from pt stating that he needs to have his belly drained & he would like to make sure that Dr Mikle Bosworth at [facility] has access to care everywhere for upcoming appt.  Called pt back & he reports having trouble breathing due to swollen abdomen.  Will discuss with Dr Burr Medico or Cira Rue NP.

## 2018-05-26 NOTE — Telephone Encounter (Signed)
I have called the patient and he is coming in today. Regan Rakers NP

## 2018-05-26 NOTE — Telephone Encounter (Signed)
I called [name] to f/u on his request for paracentesis. He reports feeling miserable. Belly is swollen. Last paracentesis on 5/14, he notes fluid reaccumulated next day. He is eating some and drinking plenty. He has frequent lose BM, 3-4 per day. Takes liq imodium BID. He is not dizzy. BP at home 110/76. He has difficulty breathing when laying down due to swollen abdomen.  He is struggling to move around the house. Wife and son help him. Given the above I feel he needs an in-person evaluation with lab. He agrees. Schedule message sent.  Cira Rue, NP  05/26/18

## 2018-05-26 NOTE — Telephone Encounter (Signed)
Please discuss pleux with him also, thanks   [name] MD

## 2018-05-26 NOTE — Progress Notes (Signed)
Matthew Lewis   [phone] [phone]   Clinic Follow up Note   Patient Care Team: [name], MD as PCP - General (Internal Medicine) Valente David, RN as [place] [facility] 05/26/2018  CHIEF COMPLAINT: swollen abdomen   SUMMARY OF ONCOLOGIC HISTORY: Oncology History   Cancer Staging Intrahepatic cholangiocarcinoma ([place]) Staging form: Intrahepatic Bile Duct, AJCC 8th Edition - Clinical stage from 10/29/2017: Stage IV (cT2, cN1, pM1) - Signed by [name], MD on 11/06/2017       Malignant neoplasm metastatic to adrenal gland [facility])    Initial Diagnosis    Malignant neoplasm metastatic to adrenal gland ([place])    10/23/2017 Imaging    ABD US IMPRESSION: Cirrhotic appearance of the liver.  2.6 cm questionable ill-defined mass versus area of focal parenchymal heterogeneity in segment 6 of the liver. If further imaging evaluation is desired, liver protocol abdominal MRI should be considered.  Splenomegaly.  Apparent thickening of the gallbladder wall, without evidence of cholelithiasis, and with negative sonographic Murphy's sign. The findings are favored to represent gallbladder wall edema secondary to systemic causes such as hyperproteinemia rather than acute cholecystitis. Please correlate clinically.     10/25/2017 Imaging    MRI IMPRESSION: 1. Advanced changes of cirrhosis. Interval development of multifocal enhancing liver lesions, which is concerning for multicentric hepatocellular carcinoma involving both lobes of liver. 2. Lesion within segment 4 B obstructs the right medial hepatic duct and left main duct resulting in moderate to marked biliary ductal dilatation within the left lobe and dome of liver. There may be partial obstruction of the right lateral duct with mild dilatation. 3. Tumor thrombus within the left branch of the portal vein likely secondary to segment 4 B tumor. 4. Bilateral adrenal  metastasis and retroperitoneal nodal metastasis. 5. Stigmata of portal venous hypertension including varices, ascites and splenomegaly. 6. These results will be called to the ordering clinician or representative by the Radiologist Assistant, and communication documented in the PACS or zVision Dashboard.    10/26/2017 Procedure    IMPRESSION: Status post formal PTC and internal/external biliary drainage.    10/27/2017 Imaging    IMPRESSION: Changes of cirrhosis. Multiple hepatic lesions are noted concerning for multifocal hepatoma or metastases. Biliary drain is in place with the tip in the distal common bile duct. There is mild intrahepatic biliary ductal dilatation. Bilateral adrenal nodules and masses, right larger than left compatible with metastases. Retroperitoneal adenopathy. Large stool burden with mild gaseous distention of the colon. Aortic atherosclerosis.     10/29/2017 Pathology Results    Diagnosis Soft Tissue Needle Core Biopsy, right adrenal mass - METASTATIC ADENOCARCINOMA, SEE COMMENT. Microscopic Comment There are malignant glands with central necrosis. Immunohistochemistry is positive for cytokeratin 20 and CDX-2. Cytokeratin 7, TTF-1 and NapsinA are negative. The morphology and immunoprofile favor a colorectal primary over pancreaticobiliary. [name] has reviewed the case. [name] was notified on 10/31/2017.    11/04/2017 Procedure    Colonoscopy impression - Seton in place found on digital rectal exam. - Congested mucosa in the rectum, in the sigmoid colon and in the descending colon. - A few diminutive polyps in the rectum and in the sigmoid colon. No colon or rectal mass identified. - No specimens collected.    02/19/2018 Imaging    CT CAP IMPRESSION: 1. Slight interval increase in size of left adrenal gland metastasis. Similar-appearing right adrenal gland metastasis. 2. While difficult to measure given ill-defined nature, infiltrative mass within  the  left hepatic lobe is grossly similar when compared to prior exam. Additional lesions within the liver are grossly similar. 3. Interval insertion of biliary drain within the left hepatic lobe with resultant decompression of the left hepatic lobe biliary system. 4. Similar-appearing sharply marginated consolidation within the right lower lobe which may represent infectious/inflammatory process or metastatic disease. 5. Large amount of stool throughout the colon as can be seen with constipation. 6. Persistent left hepatic lobe portal venous thrombosis. 7. Splenomegaly. 8. New moderate volume ascites.     Intrahepatic cholangiocarcinoma ([place])   10/27/2017 Imaging    10/27/2017 CT AP IMPRESSION: Changes of cirrhosis. Multiple hepatic lesions are noted concerning for multifocal hepatoma or metastases.  Biliary drain is in place with the tip in the distal common bile duct. There is mild intrahepatic biliary ductal dilatation.  Bilateral adrenal nodules and masses, right larger than left compatible with metastases.  Retroperitoneal adenopathy.  Large stool burden with mild gaseous distention of the colon.  Aortic atherosclerosis.    10/29/2017 Cancer Staging    Staging form: Intrahepatic Bile Duct, AJCC 8th Edition - Clinical stage from 10/29/2017: Stage IV (cT2, cN1, pM1) - Signed by [name], MD on 11/06/2017    10/29/2017 Pathology Results    10/29/2017 Surgical Pathology Diagnosis Soft Tissue Needle Core Biopsy, right adrenal mass - METASTATIC ADENOCARCINOMA, SEE COMMENT.    11/06/2017 Initial Diagnosis    Intrahepatic cholangiocarcinoma ([place])    11/06/2017 Imaging    11/06/2017 CT AP IMPRESSION: 1. Increase in RIGHT pleural effusion. Stable masslike consolidation at the RIGHT lung base representing neoplasm versus pneumonia. 2. Stable metastatic nodule at the RIGHT lung base. 3. Infiltrative mass in the central LEFT hepatic lobe and discrete lesions in  the RIGHT hepatic lobe consistent with metastatic disease versus primary hepatic biliary carcinoma. Favor cholangiocarcinoma. 4. Percutaneous drainage catheter extends the porta hepatis. Small trapped pneumothorax along the tract of the catheter at the inferolateral RIGHT lung base. 5. Stable adrenal metastasis. 6. Metastatic adenopathy in the retroperitoneum unchanged. 7. Morphologic changes in liver consistent with  cirrhosis.    11/08/2017 Imaging    11/08/2017 CT AP IMPRESSION: 1. Overall no significant interval change since the CT of 11/06/2017. Stable positioning of the percutaneous biliary drain with similar degree of biliary ductal dilatation. 2. Cirrhosis with multiple hepatic metastatic disease. Infiltrative appearing lesion in the left lobe of the liver appears to extend into the left portal vein and may represent a tumor thrombus, or bland thrombus. A primary biliary neoplasm (cholangiocarcinoma, or metastatic disease are other considerations. 3. Retroperitoneal, mesenteric, and adrenal metastatic disease. 4. Hepatic colopathy versus less likely segmental colitis of the hepatic flexure. 5. Small right pleural effusion and bilateral lung base pulmonary masses/nodules as seen previously. 6. Tiny pockets of pneumoperitoneum lateral to the liver along the percutaneous drain, decreased since the prior CT. 7. Other findings as above.    11/20/2017 - 11/24/2017 Hospital Admission    Admit date: 11/08/2017 Admission diagnosis: fatigue Additional comments: discharged 11/24/2017    12/18/2017 - 02/14/2018 Chemotherapy    first line chemo gemcitabine/cisplatin on days 1 and 15 every 28 days starting 12/18/17. Stopped 02/14/18 after cycle 3.     12/22/2017 - 12/30/2017 Hospital Admission    Admit date: 12/22/2017 Admission diagnosis: Dysuria    12/22/2017 Imaging    12/22/2017 CT AP IMPRESSION: 1. Prominently distended and otherwise normal urinary bladder with mild  prostatomegaly. Findings suggest acute bladder outlet obstruction. No hydronephrosis. 2. Large colorectal stool volume compatible with  constipation. New mild circumferential rectal wall thickening and perirectal fat haziness suggesting stercoral colitis. No free air. No abscess. 3. Stable infiltrative cholangiocarcinoma in the central left liver lobe with associated peripheral intrahepatic biliary ductal dilatation throughout the left liver lobe. 4. Stable well-positioned internal external drain traversing the right liver lobe and decompressing the right liver biliary system. 5. Stable liver, bilateral adrenal, retroperitoneal, left mesenteric and right infrahilar nodal and left lung base metastases. No progressive metastatic disease since 12/03/2017 CT. 6. Cirrhosis. Trace perihepatic ascites, decreased. Stable moderate splenomegaly. 7.  Aortic Atherosclerosis (ICD10-I70.0).    03/11/2018 -  Chemotherapy    Second lineFOLFOX every 2 weeks started 03/11/2018. Held after cycle 5 due to GI bleeding from Varices.     05/13/2018 Procedure    Upper Endoscopy by Dr Carlean Purl 05/13/18  IMPRESSION - Grade II and large (> 5 mm) esophageal varices. Completely eradicated. Banded. Nipples seen no visible bleeding but he was vomiting blood as could only lightly sedate due to shock. Was not intubated as he was DNR and declined intubation. Wife updateed by phone pre and post procedure - Red blood in the entire stomach. - No specimens collected.     INTERVAL HISTORY: Matthew Lewis presents for unscheduled visit for abdominal swelling. He was last seen by [name] on 05/21/18; he underwent paracentesis that day and received IVF and albumin as well. He notes fluid re-accumulated in his abdomen the next day. He is tight and uncomfortable. Rates pain 7/10. Tramadol 50 mg does not help. Has shortness of breath at rest and on exertion due to abdominal distention. Legs also swollen. Appetite is low but he is drinking  plenty of liquids. Denies dizziness. Biliary drain output around 1.5 liters per day. No fever or chills.  Stools are lose, has 3-4 per day. Taking imodium BID. He had an anal fistula that burst and bled recently, he saw Dr. Marcello Moores this week, no other GI bleeding. He is able to function at home. His wife and son help him. He gets to the bathroom by himself, uses caution. Walks with cane.     MEDICAL HISTORY:  Past Medical History:  Diagnosis Date  . Abscess of anal and rectal regions    horse shoe abscess  . Alcoholic cirrhosis ([place])   . Arthritis    "everywhere; especially in my spine"  . Cirrhosis ([place])    STAGE 1 CIRRHOSIS PATIENT SEES DR NAMDIGAM FOR  . Colon cancer ([place]) 05/2005   TX SURGERY WITH LYMPH NODE REMOVAL  . Cough    LAST FEW WEEKS SAW DR RAMOS LUNGS MILKY SPUTUM OCC  . DM type 2 with diabetic peripheral neuropathy ([facility])    left foot  . Fatigue   . Fistula, anal    multiple  . History of substance abuse ([place])    last alcohol was 08/2011; last marijuania was 08/2011  . Hypertension   . PTSD (post-traumatic stress disorder)    "severe" HX OF  . Sleep deprivation     SURGICAL HISTORY: Past Surgical History:  Procedure Laterality Date  . ABSCESS DRAINAGE     "probably 15-20 so far at least; rectal"  . ANAL FISTULECTOMY  12/13/1998  . ANTERIOR FUSION CERVICAL SPINE  2010   "triple"; "did 2 surgeries on the same day in 1884 due to complications"  . CORONARY ANGIOPLASTY  2001  . detached muscle  2010   right chest; "after cervical fusion complications"  . ELBOW SURGERY  2007   "cut out part  of a muscle"; left  . ESOPHAGEAL BANDING  02/25/2018   Procedure: ESOPHAGEAL BANDING;  Surgeon: Ladene Artist, MD;  Location[facility] END[facility]COPY;  Service: Endoscopy;;  . ESOPHAGEAL BANDING  05/12/2018   Procedure: ESOPHAGEAL BANDING;  Surgeon: Gatha Mayer, MD;  Location: [facility] ENDOSCOPY;  Service: Endoscopy;;  . ESOPHAGEAL BANDING  05/14/2018   Procedure: ESOPHAGEAL BANDING;  Surgeon:  Yetta Flock, MD;  Location: [facility] ENDOSCOPY;  Service: Gastroenterology;;  . ESOPHAGOGASTRODUODENOSCOPY N/A 05/12/2018   Procedure: ESOPHAGOGASTRODUODENOSCOPY (EGD);  Surgeon: Gatha Mayer, MD;  Location: [facility] ENDOSCOPY;  Service: Endoscopy;  Laterality: N/A;  . ESOPHAGOGASTRODUODENOSCOPY (EGD) WITH PROPOFOL N/A 02/25/2018   Procedure: ESOPHAGOGASTRODUODENOSCOPY (EGD) WITH PROPOFOL;  Surgeon: Ladene Artist, MD;  Location[facility] ENDOSCOPY;  Service: Endoscopy;  Laterality: N/A;  . ESOPHAGOGASTRODUODENOSCOPY (EGD) WITH PROPOFOL N/A 05/14/2018   Procedure: ESOPHAGOGASTRODUODENOSCOPY (EGD) WITH PROPOFOL;  Surgeon: Yetta Flock, MD;  Location: [facility] ENDOSCOPY;  Service: Gastroenterology;  Laterality: N/A;  . EXAMINATION UNDER ANESTHESIA  03/22/2005   fistula  . EXAMINATION UNDER ANESTHESIA  06/06/2011   Procedure: EXAM UNDER ANESTHESIA;  Surgeon: Stark Klein, MD;  Location: [place];  Service: General;  Laterality: N/A;  . HEMICOLECTOMY  06/06/2005   left  . INCISION AND DRAINAGE PERIRECTAL ABSCESS  03/30/2005  . INCISION AND DRAINAGE PERIRECTAL ABSCESS  12/13/2004  . INCISION AND DRAINAGE PERIRECTAL ABSCESS  07/14/2001  . INCISION AND DRAINAGE PERIRECTAL ABSCESS  08/12/2010   horseshoe abscess; Dr Redmond Pulling  . INCISION AND DRAINAGE PERIRECTAL ABSCESS  06/06/2011   Procedure: IRRIGATION AND DEBRIDEMENT PERIRECTAL ABSCESS;  Surgeon: Stark Klein, MD;  Location: [place];  Service: General;  Laterality: N/A;  . INCISION AND DRAINAGE PERIRECTAL ABSCESS N/A 02/24/2017   Procedure: IRRIGATION AND DEBRIDEMENT PERIRECTAL ABSCESS;  Surgeon: Ralene Ok, MD;  Location: [place];  Service: General;  Laterality: N/A;  . IR CATHETER TUBE CHANGE  02/19/2018  . IR CHOLANGIOGRAM EXISTING TUBE  12/23/2017  . IR CHOLANGIOGRAM EXISTING TUBE  01/02/2018  . IR CHOLANGIOGRAM EXISTING TUBE  05/04/2018  . IR EXCHANGE BILIARY DRAIN  11/07/2017  . IR EXCHANGE BILIARY DRAIN  12/09/2017  . IR EXCHANGE BILIARY DRAIN  02/03/2018  . IR  EXCHANGE BILIARY DRAIN  02/03/2018  . IR EXCHANGE BILIARY DRAIN  04/03/2018  . IR EXCHANGE BILIARY DRAIN  04/03/2018  . IR EXCHANGE BILIARY DRAIN  04/23/2018  . IR EXCHANGE BILIARY DRAIN  04/28/2018  . IR EXCHANGE BILIARY DRAIN  04/28/2018  . IR FLUORO RM 30-60 MIN  04/15/2018  . IR IMAGING GUIDED PORT INSERTION  12/09/2017  . IR INT EXT BILIARY DRAIN WITH CHOLANGIOGRAM  10/26/2017  . IR INT EXT BILIARY DRAIN WITH CHOLANGIOGRAM  12/23/2017  . IR PARACENTESIS  02/25/2018  . IR PATIENT EVAL TECH 0-60 MINS  12/02/2017  . IR RADIOLOGIST EVAL & MGMT  11/06/2017  . IR RADIOLOGIST EVAL & MGMT  01/05/2018  . [place]  . PERCUTANEOUS PINNING PHALANX FRACTURE OF HAND  ~ 2008   "plates in 2 places"; right  . PLACEMENT OF SETON N/A 09/05/2017   Procedure: PLACEMENT OF SETON;  Surgeon: Leighton Ruff, MD;  Location: [facility];  Service: General;  Laterality: N/A;  . RADIOLOGY WITH ANESTHESIA N/A 10/25/2017   Procedure: MRI WITH ANESTHESIA;  Surgeon: Radiologist, Medication, MD;  Location: [place];  Service: Radiology;  Laterality: N/A;  . THORACIC DISCECTOMY  1990's    I have reviewed the social history and family history with the patient  and they are unchanged from previous note.  ALLERGIES:  is allergic to morphine and related.  MEDICATIONS:  Current Outpatient Medications  Medication Sig Dispense Refill  . DULoxetine (CYMBALTA) 30 MG capsule Take 30 mg by mouth 2 (two) times daily.    Marland Kitchen gabapentin (NEURONTIN) 400 MG capsule Take 400-800 mg by mouth See admin instructions. Take 400 mg by mouth in the morning and 800 mg in the evening    . insulin detemir (LEVEMIR) 100 UNIT/ML injection Inject 10-62 Units into the skin See admin instructions. Take 62 in the am and 10 in the pm    . pantoprazole (PROTONIX) 40 MG tablet Take 1 tablet (40 mg total) by mouth daily at 6 (six) AM. 30 tablet 0  . tamsulosin (FLOMAX) 0.4 MG CAPS capsule TAKE 1 CAPSULE(0.4 MG) BY MOUTH DAILY AFTER  SUPPER (Patient taking differently: Take 0.4 mg by mouth daily after supper. ) 90 capsule 0  . traMADol (ULTRAM) 50 MG tablet Take 1 tablet (50 mg total) by mouth every 6 (six) hours as needed. 30 tablet 0  . Ca Carbonate-Mag Hydroxide (ROLAIDS PO) Take 1-2 tablets by mouth 2 (two) times daily as needed (heartburn).    . cetirizine (ZYRTEC) 10 MG tablet Take 10 mg by mouth at bedtime.    . diphenoxylate-atropine (LOMOTIL) 2.5-0.025 MG tablet Take 1 tablet by mouth 4 (four) times daily as needed for diarrhea or loose stools. 30 tablet 0  . fexofenadine (ALLEGRA) 180 MG tablet Take 180 mg by mouth daily after breakfast.     . lidocaine (LIDODERM) 5 % Place 1 patch onto the skin daily. Remove & Discard patch within 12 hours or as directed by MD 30 patch 0  . lidocaine-prilocaine (EMLA) cream Apply to port site 1 hour prior to access (Patient taking differently: Apply 1 application topically once. Apply to port site 1 hour prior to access) 30 g 0  . Multiple Vitamin (MULTIVITAMIN) tablet Take 1 tablet by mouth daily.    . polyethylene glycol (MIRALAX / GLYCOLAX) 17 g packet Take 17 g by mouth daily as needed for mild constipation. 14 each 0  . Sodium Chloride Flush (NORMAL SALINE FLUSH) 0.9 % SOLN Inject 10 mLs into the vein daily. 30 Syringe 2   No current facility-administered medications for this visit.     PHYSICAL EXAMINATION: ECOG PERFORMANCE STATUS: 3 - Symptomatic, >50% confined to bed  Vitals:   05/26/18 1225  BP: 133/88  Pulse: (!) 112  Resp: 18  Temp: 98.2 F (36.8 C)  SpO2: 100%   Filed Weights   05/26/18 1225  Weight: 217 lb 3.2 oz (98.5 kg)    GENERAL: alert, no distress, uncomfortable SKIN: mildly juandiced. No rash  EYES: sclera clear LUNGS: clear to auscultation with normal breathing effort HEART: tachycardic, moderate pitting lower extremity edema ABDOMEN: abdomen distended, consistent with ascites. Biliary drains x2 intact without erythema or drainage  NEURO:  generalized weakness. alert & oriented x 3 with fluent speech PAC wtihout erythema   LABORATORY DATA:  I have reviewed the data as listed CBC Latest Ref Rng & Units 05/26/2018 05/21/2018 05/17/2018  WBC 4.0 - 10.5 K/uL 10.0 15.4(H) 34.1(H)  Hemoglobin 13.0 - 17.0 g/dL 9.1(L) 9.0(L) 8.8(L)  Hematocrit 39.0 - 52.0 % 29.0(L) 27.1(L) 27.3(L)  Platelets 150 - 400 K/uL 181 138(L) 129(L)     CMP Latest Ref Rng & Units 05/26/2018 05/21/2018 05/16/2018  Glucose 70 - 99 mg/dL 215(H) 75 208(H)  BUN 8 - 23 mg/dL  '8 13 20  ' Creatinine 0.61 - 1.24 mg/dL 0.77 0.64 0.63  Sodium 135 - 145 mmol/L 132(L) 131(L) 136  Potassium 3.5 - 5.1 mmol/L 3.8 4.0 4.1  Chloride 98 - 111 mmol/L 101 101 104  CO2 22 - 32 mmol/L '23 23 23  ' Calcium 8.9 - 10.3 mg/dL 8.4(L) 7.9(L) 8.1(L)  Total Protein 6.5 - 8.1 g/dL 7.0 6.1(L) -  Total Bilirubin 0.3 - 1.2 mg/dL 1.8(H) 2.7(H) -  Alkaline Phos 38 - 126 U/L 308(H) 270(H) -  AST 15 - 41 U/L 27 31 -  ALT 0 - 44 U/L 26 31 -      RADIOGRAPHIC STUDIES: I have personally reviewed the radiological images as listed and agreed with the findings in the report. No results found.   ASSESSMENT & PLAN: 63 y/o male with intrahepatic cholangiocarcinoma with high output biliary drainage catheters s/p palliative chemo cycle 5 FOLFOX on 05/07/18 with recurrent ascites requiring multiple paracentesis.   1. Metastaticintrahepatic cholangiocarcinomato right adrenal gland, liver, lungs and nodes 2.Liver cirrhosis secondary to alcohol and NASH, abdominal Ascites  3. GIbleeding fromduodenal ulcers and Varices 4.Obstructive jaundice, status post bilateral percutaneous biliary drain  5. Portal vein thrombosis on aspirin  6. Lower Back pain  7. DM, HTN 8.H/o substance abuse and addiction 9. Anemia  10.Goal of care discussion, DNR/DNI 11.h/o dizziness with falls, orthostatic hypotension  Matthew Lewis appears stable for outpatient management. He has significant ascites, is symptomatic.  Last paracentesis on 05/21/18 with 3.9 liters. Cytology showed reactive mesothelial cells. I feel he is appropriate for another therapeutic para today, will be done at [facility]. We discussed possibility of indwelling peritoneal catheter due to needing frequent paracentesis lately, he declined at this time.   His abdominal pain is not well managed. Tramadol 50 mg is insufficient to control his pain. I discussed other options for pain control, such as hydrocodone or oxycodone. He has h/o substance addiction, he declined stronger pain meds. I recommend he take Tramadol 50 mg 1-2 tabs q6h PRN. I encouraged him to monitor his BM, risk for constipation increases with higher dose of pain medication. He currently has lose stool, not controlled with imodium alone. I called in lomotil.   Chemotherapy is on hold. His performance status remains low. His wife and son help him at home, and he reports being independent with ADLs. However, given his significant ascites and dyspnea, he is at high risk for another fall. He declined needing DME such as bedside commode today.    BP is stable, no significant orthostatic hypotension. He drinks adequately despite high drainage tube output. He gets IVF as needed. Labs reviewed, Hgb 9.0, normal WBC. BG 215 Bili 1.8 and trending down   He will return for his regularly scheduled f/u with [name] this week on 05/28/18. He knows to call with new or worsening concerns in the meantime.   PLAN: -Labs reviewed  -IR para today at [facility] -Rx: Lomotil  -Increase tramadol as needed, may take 1-2 tabs q6 PRN -f/u 5/21 as scheduled   No problem-specific Assessment & Plan notes found for this encounter.   Orders Placed This Encounter  Procedures  . IR Paracentesis    Standing Status:   Future    Number of Occurrences:   1    Standing Expiration Date:   07/26/2019    Order Specific Question:   If therapeutic, is there a maximum amount of fluid to be removed?    Answer:   Yes  Order Specific Question:   What is the maximum amount of fluide to be removed?    Answer:   5 liters    Order Specific Question:   Are labs required for specimen collection?    Answer:   Yes    Order Specific Question:   Lab orders requested (DO NOT place separate lab orders, these will be automatically ordered during procedure specimen collection):    Answer:   Cytology - Non Pap    Order Specific Question:   Is Albumin medication needed?    Answer:   No    Order Specific Question:   Reason for Exam (SYMPTOM  OR DIAGNOSIS REQUIRED)    Answer:   recurrent ascites    Order Specific Question:   Preferred Imaging Location?    Answer:   [facility]   All questions were answered. The patient knows to call the clinic with any problems, questions or concerns. No barriers to learning was detected. I spent 20 minutes counseling the patient face to face. The total time spent in the appointment was 25 minutes and more than 50% was on counseling and and coordination of care.      Alla Feeling, NP 05/26/18

## 2018-05-27 ENCOUNTER — Telehealth: Payer: Self-pay | Admitting: Nurse Practitioner

## 2018-05-27 DIAGNOSIS — C221 Intrahepatic bile duct carcinoma: Secondary | ICD-10-CM | POA: Diagnosis not present

## 2018-05-27 DIAGNOSIS — I8511 Secondary esophageal varices with bleeding: Secondary | ICD-10-CM | POA: Diagnosis not present

## 2018-05-27 DIAGNOSIS — R180 Malignant ascites: Secondary | ICD-10-CM | POA: Diagnosis not present

## 2018-05-27 DIAGNOSIS — Z7189 Other specified counseling: Secondary | ICD-10-CM | POA: Diagnosis not present

## 2018-05-27 LAB — CANCER ANTIGEN 19-9: CA 19-9: 27 U/mL (ref 0–35)

## 2018-05-27 NOTE — Telephone Encounter (Signed)
No los per 5/19.

## 2018-05-27 NOTE — Progress Notes (Signed)
[name]   Telephone:([phone]) [phone] Fax:(336) [phone]   Clinic Follow up Note   Patient Care Team: [name], MD as PCP - General (Internal Medicine)  Date of Service:  05/28/2018  CHIEF COMPLAINT: F/u on metastatic cholangiocarcinoma  SUMMARY OF ONCOLOGIC HISTORY: Oncology History   Cancer Staging Intrahepatic cholangiocarcinoma ([name]) Staging form: Intrahepatic Bile Duct, AJCC 8th Edition - Clinical stage from 10/29/2017: Stage IV (cT2, cN1, pM1) - Signed by [name], MD on 11/06/2017       Malignant neoplasm metastatic to adrenal gland [facility])    Initial Diagnosis    Malignant neoplasm metastatic to adrenal gland ([place])    10/23/2017 Imaging    ABD US IMPRESSION: Cirrhotic appearance of the liver.  2.6 cm questionable ill-defined mass versus area of focal parenchymal heterogeneity in segment 6 of the liver. If further imaging evaluation is desired, liver protocol abdominal MRI should be considered.  Splenomegaly.  Apparent thickening of the gallbladder wall, without evidence of cholelithiasis, and with negative sonographic Murphy's sign. The findings are favored to represent gallbladder wall edema secondary to systemic causes such as hyperproteinemia rather than acute cholecystitis. Please correlate clinically.     10/25/2017 Imaging    MRI IMPRESSION: 1. Advanced changes of cirrhosis. Interval development of multifocal enhancing liver lesions, which is concerning for multicentric hepatocellular carcinoma involving both lobes of liver. 2. Lesion within segment 4 B obstructs the right medial hepatic duct and left main duct resulting in moderate to marked biliary ductal dilatation within the left lobe and dome of liver. There may be partial obstruction of the right lateral duct with mild dilatation. 3. Tumor thrombus within the left branch of the portal vein likely secondary to segment 4 B tumor. 4. Bilateral adrenal metastasis and  retroperitoneal nodal metastasis. 5. Stigmata of portal venous hypertension including varices, ascites and splenomegaly. 6. These results will be called to the ordering clinician or representative by the Radiologist Assistant, and communication documented in the PACS or zVision Dashboard.    10/26/2017 Procedure    IMPRESSION: Status post formal PTC and internal/external biliary drainage.    10/27/2017 Imaging    IMPRESSION: Changes of cirrhosis. Multiple hepatic lesions are noted concerning for multifocal hepatoma or metastases. Biliary drain is in place with the tip in the distal common bile duct. There is mild intrahepatic biliary ductal dilatation. Bilateral adrenal nodules and masses, right larger than left compatible with metastases. Retroperitoneal adenopathy. Large stool burden with mild gaseous distention of the colon. Aortic atherosclerosis.     10/29/2017 Pathology Results    Diagnosis Soft Tissue Needle Core Biopsy, right adrenal mass - METASTATIC ADENOCARCINOMA, SEE COMMENT. Microscopic Comment There are malignant glands with central necrosis. Immunohistochemistry is positive for cytokeratin 20 and CDX-2. Cytokeratin 7, TTF-1 and NapsinA are negative. The morphology and immunoprofile favor a colorectal primary over pancreaticobiliary. [name] has reviewed the case. [name] was notified on 10/31/2017.    11/04/2017 Procedure    Colonoscopy impression - Seton in place found on digital rectal exam. - Congested mucosa in the rectum, in the sigmoid colon and in the descending colon. - A few diminutive polyps in the rectum and in the sigmoid colon. No colon or rectal mass identified. - No specimens collected.    02/19/2018 Imaging    CT CAP IMPRESSION: 1. Slight interval increase in size of left adrenal gland metastasis. Similar-appearing right adrenal gland metastasis. 2. While difficult to measure given ill-defined nature, infiltrative mass within the left  hepatic lobe  is grossly similar when compared to prior exam. Additional lesions within the liver are grossly similar. 3. Interval insertion of biliary drain within the left hepatic lobe with resultant decompression of the left hepatic lobe biliary system. 4. Similar-appearing sharply marginated consolidation within the right lower lobe which may represent infectious/inflammatory process or metastatic disease. 5. Large amount of stool throughout the colon as can be seen with constipation. 6. Persistent left hepatic lobe portal venous thrombosis. 7. Splenomegaly. 8. New moderate volume ascites.     Intrahepatic cholangiocarcinoma ([name])   10/27/2017 Imaging    10/27/2017 CT AP IMPRESSION: Changes of cirrhosis. Multiple hepatic lesions are noted concerning for multifocal hepatoma or metastases.  Biliary drain is in place with the tip in the distal common bile duct. There is mild intrahepatic biliary ductal dilatation.  Bilateral adrenal nodules and masses, right larger than left compatible with metastases.  Retroperitoneal adenopathy.  Large stool burden with mild gaseous distention of the colon.  Aortic atherosclerosis.    10/29/2017 Cancer Staging    Staging form: Intrahepatic Bile Duct, AJCC 8th Edition - Clinical stage from 10/29/2017: Stage IV (cT2, cN1, pM1) - Signed by [name], MD on 11/06/2017    10/29/2017 Pathology Results    10/29/2017 Surgical Pathology Diagnosis Soft Tissue Needle Core Biopsy, right adrenal mass - METASTATIC ADENOCARCINOMA, SEE COMMENT.    11/06/2017 Initial Diagnosis    Intrahepatic cholangiocarcinoma ([name])    11/06/2017 Imaging    11/06/2017 CT AP IMPRESSION: 1. Increase in RIGHT pleural effusion. Stable masslike consolidation at the RIGHT lung base representing neoplasm versus pneumonia. 2. Stable metastatic nodule at the RIGHT lung base. 3. Infiltrative mass in the central LEFT hepatic lobe and discrete lesions in the RIGHT  hepatic lobe consistent with metastatic disease versus primary hepatic biliary carcinoma. Favor cholangiocarcinoma. 4. Percutaneous drainage catheter extends the porta hepatis. Small trapped pneumothorax along the tract of the catheter at the inferolateral RIGHT lung base. 5. Stable adrenal metastasis. 6. Metastatic adenopathy in the retroperitoneum unchanged. 7. Morphologic changes in liver consistent with  cirrhosis.    11/08/2017 Imaging    11/08/2017 CT AP IMPRESSION: 1. Overall no significant interval change since the CT of 11/06/2017. Stable positioning of the percutaneous biliary drain with similar degree of biliary ductal dilatation. 2. Cirrhosis with multiple hepatic metastatic disease. Infiltrative appearing lesion in the left lobe of the liver appears to extend into the left portal vein and may represent a tumor thrombus, or bland thrombus. A primary biliary neoplasm (cholangiocarcinoma, or metastatic disease are other considerations. 3. Retroperitoneal, mesenteric, and adrenal metastatic disease. 4. Hepatic colopathy versus less likely segmental colitis of the hepatic flexure. 5. Small right pleural effusion and bilateral lung base pulmonary masses/nodules as seen previously. 6. Tiny pockets of pneumoperitoneum lateral to the liver along the percutaneous drain, decreased since the prior CT. 7. Other findings as above.    11/20/2017 - 11/24/2017 Hospital Admission    Admit date: 11/08/2017 Admission diagnosis: fatigue Additional comments: discharged 11/24/2017    12/18/2017 - 02/14/2018 Chemotherapy    first line chemo gemcitabine/cisplatin on days 1 and 15 every 28 days starting 12/18/17. Stopped 02/14/18 after cycle 3.     12/22/2017 - 12/30/2017 Hospital Admission    Admit date: 12/22/2017 Admission diagnosis: Dysuria    12/22/2017 Imaging    12/22/2017 CT AP IMPRESSION: 1. Prominently distended and otherwise normal urinary bladder with mild prostatomegaly.  Findings suggest acute bladder outlet obstruction. No hydronephrosis. 2. Large colorectal stool volume compatible with constipation. New mild  circumferential rectal wall thickening and perirectal fat haziness suggesting stercoral colitis. No free air. No abscess. 3. Stable infiltrative cholangiocarcinoma in the central left liver lobe with associated peripheral intrahepatic biliary ductal dilatation throughout the left liver lobe. 4. Stable well-positioned internal external drain traversing the right liver lobe and decompressing the right liver biliary system. 5. Stable liver, bilateral adrenal, retroperitoneal, left mesenteric and right infrahilar nodal and left lung base metastases. No progressive metastatic disease since 12/03/2017 CT. 6. Cirrhosis. Trace perihepatic ascites, decreased. Stable moderate splenomegaly. 7.  Aortic Atherosclerosis (ICD10-I70.0).    03/11/2018 -  Chemotherapy    Second lineFOLFOX every 2 weeks started 03/11/2018. Held after cycle 5 due to GI bleeding from Varices.     05/13/2018 Procedure    Upper Endoscopy by Dr Carlean Purl 05/13/18  IMPRESSION - Grade II and large (> 5 mm) esophageal varices. Completely eradicated. Banded. Nipples seen no visible bleeding but he was vomiting blood as could only lightly sedate due to shock. Was not intubated as he was DNR and declined intubation. Wife updateed by phone pre and post procedure - Red blood in the entire stomach. - No specimens collected.      CURRENT THERAPY:  Pending hospice   INTERVAL HISTORY:  Matthew Lewis is here for a follow up. He is here alone. He notes he felt better after paracentesis with 5L removed. He has been having frequent procedures but doe snot want another draining tube. He notes his bilary drains are doing well with no leakage. His output is 1.5L daily, no blood. He drinks Gatorade, tea, ginger ale, coffee and water about 2 Liters a day. He feels his appetite is adequate. He notes his pain  is at a 7-8/10. He will take tramadol which moderately helps. He has not been able to do much at home outside of ADL. He does not do much house work.  He notes he requested to see a liver specialist at [facility] about a Liver transplant. He plans to see another Scientist, research (medical) at [facility].  He notes he and his family plan to contact hospice for home care. He does not have a living ill set but he notes he will have his wife as POA. He understands his prognosis.    REVIEW OF SYSTEMS:   Constitutional: Denies fevers, chills or abnormal weight loss Eyes: Denies blurriness of vision Ears, nose, mouth, throat, and face: Denies mucositis or sore throat Respiratory: Denies cough, dyspnea or wheezes Cardiovascular: Denies palpitation, chest discomfort (+) lower extremity swelling Gastrointestinal:  Denies nausea, heartburn or change in bowel habits (+) 2 biliary drains, clean  Skin: Denies abnormal skin rashes Lymphatics: Denies new lymphadenopathy or easy bruising Neurological:Denies numbness, tingling or new weaknesses Behavioral/Psych: Mood is stable, no new changes  All other systems were reviewed with the patient and are negative.  MEDICAL HISTORY:  Past Medical History:  Diagnosis Date  . Abscess of anal and rectal regions    horse shoe abscess  . Alcoholic cirrhosis ([place])   . Arthritis    "everywhere; especially in my spine"  . Cirrhosis ([place])    STAGE 1 CIRRHOSIS PATIENT SEES DR NAMDIGAM FOR  . Colon cancer ([place]) 05/2005   TX SURGERY WITH LYMPH NODE REMOVAL  . Cough    LAST FEW WEEKS SAW DR RAMOS LUNGS MILKY SPUTUM OCC  . DM type 2 with diabetic peripheral neuropathy ([facility])    left foot  . Fatigue   . Fistula, anal    multiple  . History  of substance abuse ([place])    last alcohol was 08/2011; last marijuania was 08/2011  . Hypertension   . PTSD (post-traumatic stress disorder)    "severe" HX OF  . Sleep deprivation     SURGICAL HISTORY: Past Surgical History:  Procedure Laterality  Date  . ABSCESS DRAINAGE     "probably 15-20 so far at least; rectal"  . ANAL FISTULECTOMY  12/13/1998  . ANTERIOR FUSION CERVICAL SPINE  2010   "triple"; "did 2 surgeries on the same day in 6195 due to complications"  . CORONARY ANGIOPLASTY  2001  . detached muscle  2010   right chest; "after cervical fusion complications"  . ELBOW SURGERY  2007   "cut out part of a muscle"; left  . ESOPHAGEAL BANDING  02/25/2018   Procedure: ESOPHAGEAL BANDING;  Surgeon: Ladene Artist, MD;  Location: [facility] ENDOSCOPY;  Service: Endoscopy;;  . ESOPHAGEAL BANDING  05/12/2018   Procedure: ESOPHAGEAL BANDING;  Surgeon: Gatha Mayer, MD;  Location: [facility] ENDOSCOPY;  Service: Endoscopy;;  . ESOPHAGEAL BANDING  05/14/2018   Procedure: ESOPHAGEAL BANDING;  Surgeon: Yetta Flock, MD;  Location: [facility] ENDOSCOPY;  Service: Gastroenterology;;  . ESOPHAGOGASTRODUODENOSCOPY N/A 05/12/2018   Procedure: ESOPHAGOGASTRODUODENOSCOPY (EGD);  Surgeon: Gatha Mayer, MD;  Location: [facility] ENDOSCOPY;  Service: Endoscopy;  Laterality: N/A;  . ESOPHAGOGASTRODUODENOSCOPY (EGD) WITH PROPOFOL N/A 02/25/2018   Procedure: ESOPHAGOGASTRODUODENOSCOPY (EGD) WITH PROPOFOL;  Surgeon: Ladene Artist, MD;  Location: [facility] ENDOSCOPY;  Service: Endoscopy;  Laterality: N/A;  . ESOPHAGOGASTRODUODENOSCOPY (EGD) WITH PROPOFOL N/A 05/14/2018   Procedure: ESOPHAGOGASTRODUODENOSCOPY (EGD) WITH PROPOFOL;  Surgeon: Yetta Flock, MD;  Location: [facility] ENDOSCOPY;  Service: Gastroenterology;  Laterality: N/A;  . EXAMINATION UNDER ANESTHESIA  03/22/2005   fistula  . EXAMINATION UNDER ANESTHESIA  06/06/2011   Procedure: EXAM UNDER ANESTHESIA;  Surgeon: Stark Klein, MD;  Location: [place];  Service: General;  Laterality: N/A;  . HEMICOLECTOMY  06/06/2005   left  . INCISION AND DRAINAGE PERIRECTAL ABSCESS  03/30/2005  . INCISION AND DRAINAGE PERIRECTAL ABSCESS  12/13/2004  . INCISION AND DRAINAGE PERIRECTAL ABSCESS  07/14/2001  . INCISION AND DRAINAGE PERIRECTAL  ABSCESS  08/12/2010   horseshoe abscess; Dr Redmond Pulling  . INCISION AND DRAINAGE PERIRECTAL ABSCESS  06/06/2011   Procedure: IRRIGATION AND DEBRIDEMENT PERIRECTAL ABSCESS;  Surgeon: Stark Klein, MD;  Location: [place];  Service: General;  Laterality: N/A;  . INCISION AND DRAINAGE PERIRECTAL ABSCESS N/A 02/24/2017   Procedure: IRRIGATION AND DEBRIDEMENT PERIRECTAL ABSCESS;  Surgeon: Ralene Ok, MD;  Location: [place];  Service: General;  Laterality: N/A;  . IR CATHETER TUBE CHANGE  02/19/2018  . IR CHOLANGIOGRAM EXISTING TUBE  12/23/2017  . IR CHOLANGIOGRAM EXISTING TUBE  01/02/2018  . IR CHOLANGIOGRAM EXISTING TUBE  05/04/2018  . IR EXCHANGE BILIARY DRAIN  11/07/2017  . IR EXCHANGE BILIARY DRAIN  12/09/2017  . IR EXCHANGE BILIARY DRAIN  02/03/2018  . IR EXCHANGE BILIARY DRAIN  02/03/2018  . IR EXCHANGE BILIARY DRAIN  04/03/2018  . IR EXCHANGE BILIARY DRAIN  04/03/2018  . IR EXCHANGE BILIARY DRAIN  04/23/2018  . IR EXCHANGE BILIARY DRAIN  04/28/2018  . IR EXCHANGE BILIARY DRAIN  04/28/2018  . IR FLUORO RM 30-60 MIN  04/15/2018  . IR IMAGING GUIDED PORT INSERTION  12/09/2017  . IR INT EXT BILIARY DRAIN WITH CHOLANGIOGRAM  10/26/2017  . IR INT EXT BILIARY DRAIN WITH CHOLANGIOGRAM  12/23/2017  . IR PARACENTESIS  02/25/2018  . IR PARACENTESIS  05/26/2018  . IR  PATIENT EVAL TECH 0-60 MINS  12/02/2017  . IR RADIOLOGIST EVAL & MGMT  11/06/2017  . IR RADIOLOGIST EVAL & MGMT  01/05/2018  . [place]  . PERCUTANEOUS PINNING PHALANX FRACTURE OF HAND  ~ 2008   "plates in 2 places"; right  . PLACEMENT OF SETON N/A 09/05/2017   Procedure: PLACEMENT OF SETON;  Surgeon: Leighton Ruff, MD;  Location[facility];  Service: General;  Laterality: N/A;  . RADIOLOGY WITH ANESTHESIA N/A 10/25/2017   Procedure: MRI WITH ANESTHESIA;  Surgeon: Radiologist, Medication, MD;  Location: [place];  Service: Radiology;  Laterality: N/A;  . THORACIC DISCECTOMY  1990's    I have reviewed the social history  and family history with the patient and they are unchanged from previous note.  ALLERGIES:  is allergic to morphine and related.  MEDICATIONS:  Current Outpatient Medications  Medication Sig Dispense Refill  . Ca Carbonate-Mag Hydroxide (ROLAIDS PO) Take 1-2 tablets by mouth 2 (two) times daily as needed (heartburn).    . cetirizine (ZYRTEC) 10 MG tablet Take 10 mg by mouth at bedtime.    . diphenoxylate-atropine (LOMOTIL) 2.5-0.025 MG tablet Take 1 tablet by mouth 4 (four) times daily as needed for diarrhea or loose stools. 30 tablet 0  . DULoxetine (CYMBALTA) 30 MG capsule Take 30 mg by mouth 2 (two) times daily.    . fexofenadine (ALLEGRA) 180 MG tablet Take 180 mg by mouth daily after breakfast.     . gabapentin (NEURONTIN) 400 MG capsule Take 400-800 mg by mouth See admin instructions. Take 400 mg by mouth in the morning and 800 mg in the evening    . insulin detemir (LEVEMIR) 100 UNIT/ML injection Inject 10-62 Units into the skin See admin instructions. Take 62 in the am and 10 in the pm    . lidocaine (LIDODERM) 5 % Place 1 patch onto the skin daily. Remove & Discard patch within 12 hours or as directed by MD 30 patch 0  . lidocaine-prilocaine (EMLA) cream Apply to port site 1 hour prior to access (Patient taking differently: Apply 1 application topically once. Apply to port site 1 hour prior to access) 30 g 0  . Multiple Vitamin (MULTIVITAMIN) tablet Take 1 tablet by mouth daily.    . pantoprazole (PROTONIX) 40 MG tablet Take 1 tablet (40 mg total) by mouth daily at 6 (six) AM. 30 tablet 0  . polyethylene glycol (MIRALAX / GLYCOLAX) 17 g packet Take 17 g by mouth daily as needed for mild constipation. 14 each 0  . Sodium Chloride Flush (NORMAL SALINE FLUSH) 0.9 % SOLN Inject 10 mLs into the vein daily. 30 Syringe 2  . tamsulosin (FLOMAX) 0.4 MG CAPS capsule TAKE 1 CAPSULE(0.4 MG) BY MOUTH DAILY AFTER SUPPER (Patient taking differently: Take 0.4 mg by mouth daily after supper. ) 90 capsule  0  . traMADol (ULTRAM) 50 MG tablet Take 1 tablet (50 mg total) by mouth every 6 (six) hours as needed. 30 tablet 0   No current facility-administered medications for this visit.     PHYSICAL EXAMINATION: ECOG PERFORMANCE STATUS: 3 - Symptomatic, >50% confined to bed  Vitals:   05/28/18 1010  BP: 114/78  Pulse: (!) 108  Resp: 20  Temp: 98.9 F (37.2 C)  SpO2: 100%   Filed Weights   05/28/18 1010  Weight: 207 lb 6.4 oz (94.1 kg)    GENERAL:alert, no distress and comfortable SKIN: skin color, texture, turgor are normal, no rashes  or significant lesions EYES: normal, Conjunctiva are pink and non-injected, sclera clear OROPHARYNX:no exudate, no erythema and lips, buccal mucosa, and tongue normal  NECK: supple, thyroid normal size, non-tender, without nodularity LYMPH:  no palpable lymphadenopathy in the cervical, axillary or inguinal LUNGS: clear to auscultation and percussion with normal breathing effort HEART: regular rate & rhythm and no murmurs (+)  lower extremity edema ABDOMEN:abdomen soft, non-tender and normal bowel sounds (+) Left and right biliary drains clean (+) significant Hepatomegaly, moderate to large ascites  Musculoskeletal:no cyanosis of digits and no clubbing  NEURO: alert & oriented x 3 with fluent speech, no focal motor/sensory deficits  LABORATORY DATA:  I have reviewed the data as listed CBC Latest Ref Rng & Units 05/28/2018 05/26/2018 05/21/2018  WBC 4.0 - 10.5 K/uL 7.4 10.0 15.4(H)  Hemoglobin 13.0 - 17.0 g/dL 8.3(L) 9.1(L) 9.0(L)  Hematocrit 39.0 - 52.0 % 25.8(L) 29.0(L) 27.1(L)  Platelets 150 - 400 K/uL 169 181 138(L)     CMP Latest Ref Rng & Units 05/28/2018 05/26/2018 05/21/2018  Glucose 70 - 99 mg/dL 190(H) 215(H) 75  BUN 8 - 23 mg/dL _0 Creatinine 0.61 - 1.24 mg/dL 0.67 0.77 0.64  Sodium 135 - 145 mmol/L 130(L) 132(L) 131(L)  Potassium 3.5 - 5.1 mmol/L 3.6 3.8 4.0  Chloride 98 - 111 mmol/L 101 101 101  CO2 22 - 32 mmol/L _1 Calcium 8.9 - 10.3 mg/dL 8.0(L) 8.4(L) 7.9(L)  Total Protein 6.5 - 8.1 g/dL 6.7 7.0 6.1(L)  Total Bilirubin 0.3 - 1.2 mg/dL 1.3(H) 1.8(H) 2.7(H)  Alkaline Phos 38 - 126 U/L 334(H) 308(H) 270(H)  AST 15 - 41 U/L _2 ALT 0 - 44 U/L _3 RADIOGRAPHIC STUDIES: I have personally reviewed the radiological images as listed and agreed with the findings in the report. Ir Paracentesis  Result Date: 05/26/2018 INDICATION: Patient with history of NASH cirrhosis, intrahepatic cholangiocarcinoma, and recurrent ascites. Request is made for diagnostic and therapeutic paracentesis with a max of 5 L. EXAM: ULTRASOUND GUIDED DIAGNOSTIC AND THERAPEUTIC PARACENTESIS MEDICATIONS: 10 mL 1% lidocaine COMPLICATIONS: None immediate. PROCEDURE: Informed written consent was obtained from the patient after a discussion of the risks, benefits and alternatives to treatment. A timeout was performed prior to the initiation of the procedure. Initial ultrasound scanning demonstrates a large amount of ascites within the left lower abdominal quadrant. The left lower abdomen was prepped and draped in the usual sterile fashion. 1% lidocaine was used for local anesthesia. Following this, a 19 gauge, 10-cm, Yueh catheter was introduced. An ultrasound image was saved for documentation purposes. The paracentesis was performed. The catheter was removed and a dressing was applied. The patient tolerated the procedure well without immediate post procedural complication. FINDINGS: A total of approximately 5 L of clear yellow fluid was removed. Samples were sent to the laboratory as requested by the clinical team. IMPRESSION: Successful ultrasound-guided paracentesis yielding 5 L of peritoneal fluid. Read by: Earley Abide, PA-C Electronically Signed   By: Markus Daft M.D.   On: 05/26/2018 15:10     ASSESSMENT & PLAN:  KESHON MARKOVITZ is a 63 y.o. male with   1. Metastaticintrahepatic cholangiocarcinomato right adrenal gland,  liver, lungs and nodes -Diagnosed in 10/2017. He had 2 biliary drainage placements. His cancer treatment was postponed due to multiple hospitalization for infection, dehydration, it etc. Hewasonfirst lineCisplatin and gemcitabinestopped on 02/20/2018 because his restaging CT scan showed enlarged adrenal glad  metastasis consistent disease progression. -He has had multiplehospitalizations since his diagnosis of cancer forinfection, dehydration and GI bleeding -He startedsecond linetreatmentFOLFOXon 03/11/2018 and was only given5-FU and leucovorin on for first cycle due to recent GI bleeding and will continue treatment every 2 weeks.And then Oxaliplatin was added with cycle 2. -Chemo held after cycle 5 (05/07/18) due to severe GI bleeding from Varices. -he has developed large ascites, requiring frequent paracentesis  -He continues to have low performance status with significant fatigue, very limited activities, adequate eating and drinking.  -Pt is interested in Liver transplant and clinical trail, however he is not eligible due to his metastatic disease and poor PS -Due to overall deterioration since the varices bleeding, he is not a candidate for more chemotherapy. I discussed palliative care and hospice in length. At the end of conversation he agreed with hospice -He is interested in starting hospice at home care then residential hospice as he progresses. I discussed with palliative care we will continue to monitor and treat his symptoms. I will contact them today.  -He wants to have the last varices banding procedure in near future if possible, will contact GI  -He will contact me as needed. F/u open   2.Liver cirrhosis secondary to alcohol and NASH, recurrent Ascites  -Has worsened lately and lead to recent severe GI bleeding from Varices. GI bleeding has currently stopped.  -He has significant abdominal ascites on exam previously with significant increase in weight.  -He also has  crackles at base of lungs likely secondary to ascites. I encouraged him to practice deep breathing to open up his lungs. -Paracentesis from 05/27/18 showed 5L removed. He has had frequent procedures and I suggested he get pleurex placed. He will think about it, but will likely proceed with it.    3. GIbleeding fromduodenal ulcers and Varices -Patient developed upper GI bleeding on February 24, 2018, required hospitalization, and multiple blood transfusion. -Bleeding has stopped and is possibly related to NSAIDs  -Continue Protonix twice daily indefinitelyper GI Dr. Fuller Plan  -He had severe upper GI bleeding from varices which required urgent banding on 05/13/18 and blood transfusions. Bleeding has stopped since.  -He will follow up with Dr. Silverio Decamp for likely second banding to prevent more bleeding. I will contact her.    4.Obstructive jaundice, status post bilateral percutaneous biliary drain  -drained tube exchange1/28, both were capped. Due to bilary leakage from left side this morning, the left drain tube is connected to a bag now,it is draining well, and his output has decreased. -f/u with IR -continue monitoring LFTs -He is only mildly jaundiced. He recently had right biliary drain replaced but he continues to have high output leading to persistent leaking at site. This causes him skin irritation. I encouraged him to contact IR as he may need a stitch placed to better hold drain. He is agreeable.  -I encouraged him to drink 3 liters a day of fluid. He is agreeable. -He is currently draining 1.5L daily. -Biliary drains no longer leaking and clear. He is willing to remove drains if no longer tolerable.   4. Portal vein thrombosis -He is on aspirin, no anticoagulation due toinitialbloody biliary drainage and recent severe varices bleeding   5. Lower Back pain  -secondary from recent fall  -MRI spine was negative for fracture -He is currently on Tramadol, his pain is  moderately controlled. I discussed I will call in stronger medication if pain worseness.   6. DM, HTN -f/u with PCP  7.H/o substance  abuse and addiction -He's been clean for 6 years now -will becautious about narcoticsandbenzo.  8. Anemia  -Secondary to chemotherapy, recent GI bleedingand underlying malignancy -He received 5 unit blood transfusion during his hospitalization for GI bleeding, anemia improved. -Hg8.3today, no need for blood transfusion. (05/28/18)  9.Goal of care discussion, DNR/DNI -Weagain discussed code status in length.I recommended DNR/DNI,he agreed.  -He understands his cancer is not curable, and the goal of therapy is to prolong his life and improve his quality of life. -I recommend he get a living will and POA in order. He agrees.    Plan Pleurex placement next week by IR  I will contact Dr. Silverio Decamp about varices banding  Palliative care and Hospice referral, f/u open as needed  I will call his wife later today to update her and answers her questions   No problem-specific Assessment & Plan notes found for this encounter.   Orders Placed This Encounter  Procedures  . IR PERC PLEURAL DRAIN W/INDWELL CATH W/IMG GUIDE    Standing Status:   Future    Standing Expiration Date:   07/28/2019    Order Specific Question:   Reason for Exam (SYMPTOM  OR DIAGNOSIS REQUIRED)    Answer:   symptom management    Order Specific Question:   Preferred Imaging Location?    Answer:   [facility]  . Ambulatory referral to Hospice    Referral Priority:   Routine    Referral Type:   Consultation    Referral Reason:   Specialty Services Required    Requested Specialty:   Hospice Services    Number of Visits Requested:   1   All questions were answered. The patient knows to call the clinic with any problems, questions or concerns. No barriers to learning was detected. I spent 30 minutes counseling the patient face to face. The total time spent in  the appointment was 40 minutes and more than 50% was on counseling and review of test results     [name], MD 05/28/2018   I, Joslyn Devon, am acting as scribe for [name], MD.   I have reviewed the above documentation for accuracy and completeness, and I agree with the above.

## 2018-05-28 ENCOUNTER — Encounter: Payer: Self-pay | Admitting: Hematology

## 2018-05-28 ENCOUNTER — Inpatient Hospital Stay: Payer: Medicare Other

## 2018-05-28 ENCOUNTER — Inpatient Hospital Stay (HOSPITAL_BASED_OUTPATIENT_CLINIC_OR_DEPARTMENT_OTHER): Payer: Medicare Other | Admitting: Hematology

## 2018-05-28 ENCOUNTER — Other Ambulatory Visit: Payer: Self-pay

## 2018-05-28 ENCOUNTER — Telehealth: Payer: Self-pay

## 2018-05-28 VITALS — BP 114/78 | HR 108 | Temp 98.9°F | Resp 20 | Ht 71.0 in | Wt 207.4 lb

## 2018-05-28 DIAGNOSIS — E119 Type 2 diabetes mellitus without complications: Secondary | ICD-10-CM | POA: Diagnosis not present

## 2018-05-28 DIAGNOSIS — C7801 Secondary malignant neoplasm of right lung: Secondary | ICD-10-CM

## 2018-05-28 DIAGNOSIS — C7972 Secondary malignant neoplasm of left adrenal gland: Secondary | ICD-10-CM

## 2018-05-28 DIAGNOSIS — C7802 Secondary malignant neoplasm of left lung: Secondary | ICD-10-CM | POA: Diagnosis not present

## 2018-05-28 DIAGNOSIS — C772 Secondary and unspecified malignant neoplasm of intra-abdominal lymph nodes: Secondary | ICD-10-CM | POA: Diagnosis not present

## 2018-05-28 DIAGNOSIS — D649 Anemia, unspecified: Secondary | ICD-10-CM | POA: Diagnosis not present

## 2018-05-28 DIAGNOSIS — Z794 Long term (current) use of insulin: Secondary | ICD-10-CM

## 2018-05-28 DIAGNOSIS — C787 Secondary malignant neoplasm of liver and intrahepatic bile duct: Secondary | ICD-10-CM | POA: Diagnosis not present

## 2018-05-28 DIAGNOSIS — R0602 Shortness of breath: Secondary | ICD-10-CM | POA: Diagnosis not present

## 2018-05-28 DIAGNOSIS — K7030 Alcoholic cirrhosis of liver without ascites: Secondary | ICD-10-CM

## 2018-05-28 DIAGNOSIS — Z79899 Other long term (current) drug therapy: Secondary | ICD-10-CM | POA: Diagnosis not present

## 2018-05-28 DIAGNOSIS — C221 Intrahepatic bile duct carcinoma: Secondary | ICD-10-CM | POA: Diagnosis not present

## 2018-05-28 DIAGNOSIS — Z95828 Presence of other vascular implants and grafts: Secondary | ICD-10-CM

## 2018-05-28 DIAGNOSIS — C7971 Secondary malignant neoplasm of right adrenal gland: Secondary | ICD-10-CM

## 2018-05-28 DIAGNOSIS — K7031 Alcoholic cirrhosis of liver with ascites: Secondary | ICD-10-CM

## 2018-05-28 DIAGNOSIS — R140 Abdominal distension (gaseous): Secondary | ICD-10-CM | POA: Diagnosis not present

## 2018-05-28 DIAGNOSIS — I10 Essential (primary) hypertension: Secondary | ICD-10-CM | POA: Diagnosis not present

## 2018-05-28 LAB — CMP (CANCER CENTER ONLY)
ALT: 23 U/L (ref 0–44)
AST: 26 U/L (ref 15–41)
Albumin: 2 g/dL — ABNORMAL LOW (ref 3.5–5.0)
Alkaline Phosphatase: 334 U/L — ABNORMAL HIGH (ref 38–126)
Anion gap: 6 (ref 5–15)
BUN: 10 mg/dL (ref 8–23)
CO2: 23 mmol/L (ref 22–32)
Calcium: 8 mg/dL — ABNORMAL LOW (ref 8.9–10.3)
Chloride: 101 mmol/L (ref 98–111)
Creatinine: 0.67 mg/dL (ref 0.61–1.24)
GFR, Est AFR Am: >60 mL/min (ref >60)
GFR, Estimated: >60 mL/min (ref >60)
Glucose, Bld: 190 mg/dL — ABNORMAL HIGH (ref 70–99)
Potassium: 3.6 mmol/L (ref 3.5–5.1)
Sodium: 130 mmol/L — ABNORMAL LOW (ref 135–145)
Total Bilirubin: 1.3 mg/dL — ABNORMAL HIGH (ref 0.3–1.2)
Total Protein: 6.7 g/dL (ref 6.5–8.1)

## 2018-05-28 LAB — CBC WITH DIFFERENTIAL (CANCER CENTER ONLY)
Abs Immature Granulocytes: 0.04 10*3/uL (ref 0.00–0.07)
Basophils Absolute: 0 10*3/uL (ref 0.0–0.1)
Basophils Relative: 0 %
Eosinophils Absolute: 0.1 10*3/uL (ref 0.0–0.5)
Eosinophils Relative: 2 %
HCT: 25.8 % — ABNORMAL LOW (ref 39.0–52.0)
Hemoglobin: 8.3 g/dL — ABNORMAL LOW (ref 13.0–17.0)
Immature Granulocytes: 1 %
Lymphocytes Relative: 11 %
Lymphs Abs: 0.8 10*3/uL (ref 0.7–4.0)
MCH: 30.3 pg (ref 26.0–34.0)
MCHC: 32.2 g/dL (ref 30.0–36.0)
MCV: 94.2 fL (ref 80.0–100.0)
Monocytes Absolute: 0.7 10*3/uL (ref 0.1–1.0)
Monocytes Relative: 9 %
Neutro Abs: 5.7 10*3/uL (ref 1.7–7.7)
Neutrophils Relative %: 77 %
Platelet Count: 169 10*3/uL (ref 150–400)
RBC: 2.74 MIL/uL — ABNORMAL LOW (ref 4.22–5.81)
RDW: 17.6 % — ABNORMAL HIGH (ref 11.5–15.5)
WBC Count: 7.4 10*3/uL (ref 4.0–10.5)
nRBC: 0 % (ref 0.0–0.2)

## 2018-05-28 MED ORDER — HEPARIN SOD (PORK) LOCK FLUSH 100 UNIT/ML IV SOLN
500.0000 [IU] | Freq: Once | INTRAVENOUS | Status: AC | PRN
  Administered 2018-05-28: 500 [IU]
  Filled 2018-05-28: qty 5

## 2018-05-28 MED ORDER — SODIUM CHLORIDE 0.9% FLUSH
10.0000 mL | INTRAVENOUS | Status: DC | PRN
  Administered 2018-05-28: 10 mL
  Filled 2018-05-28: qty 10

## 2018-05-28 NOTE — Telephone Encounter (Signed)
Faxed referral to [facility] at [phone] along with OV note from today and demographics.  Requested services start asap.

## 2018-05-29 ENCOUNTER — Other Ambulatory Visit (HOSPITAL_COMMUNITY): Payer: Medicare Other

## 2018-05-29 LAB — CANCER ANTIGEN 19-9: CA 19-9: 23 U/mL (ref 0–35)

## 2018-06-02 ENCOUNTER — Ambulatory Visit (HOSPITAL_COMMUNITY): Admission: RE | Admit: 2018-06-02 | Payer: Medicare Other | Source: Ambulatory Visit

## 2018-06-02 ENCOUNTER — Other Ambulatory Visit: Payer: Self-pay | Admitting: *Deleted

## 2018-06-02 ENCOUNTER — Telehealth: Payer: Self-pay | Admitting: Hematology

## 2018-06-02 ENCOUNTER — Encounter (HOSPITAL_COMMUNITY): Payer: Self-pay | Admitting: Radiology

## 2018-06-02 ENCOUNTER — Other Ambulatory Visit: Payer: Self-pay | Admitting: Hematology

## 2018-06-02 ENCOUNTER — Other Ambulatory Visit: Payer: Self-pay

## 2018-06-02 ENCOUNTER — Ambulatory Visit (HOSPITAL_COMMUNITY)
Admission: RE | Admit: 2018-06-02 | Discharge: 2018-06-02 | Disposition: A | Discharge status: Home / Self Care | Payer: Medicare Other | Source: Ambulatory Visit | Attending: Hematology | Admitting: Hematology

## 2018-06-02 ENCOUNTER — Telehealth: Payer: Self-pay | Admitting: *Deleted

## 2018-06-02 DIAGNOSIS — K7030 Alcoholic cirrhosis of liver without ascites: Secondary | ICD-10-CM | POA: Insufficient documentation

## 2018-06-02 DIAGNOSIS — C221 Intrahepatic bile duct carcinoma: Secondary | ICD-10-CM

## 2018-06-02 DIAGNOSIS — R188 Other ascites: Secondary | ICD-10-CM | POA: Diagnosis not present

## 2018-06-02 DIAGNOSIS — R180 Malignant ascites: Secondary | ICD-10-CM | POA: Insufficient documentation

## 2018-06-02 HISTORY — PX: IR PARACENTESIS: IMG2679

## 2018-06-02 MED ORDER — HYDROCODONE-ACETAMINOPHEN 5-325 MG PO TABS: 1.0000 | ORAL_TABLET | Freq: Four times a day (QID) | ORAL | 0 refills | Status: DC | PRN

## 2018-06-02 MED ORDER — LIDOCAINE HCL 1 % IJ SOLN
INTRAMUSCULAR | Status: DC | PRN
  Administered 2018-06-02: 5 mL

## 2018-06-02 MED ORDER — LIDOCAINE HCL 1 % IJ SOLN
INTRAMUSCULAR | Status: AC
  Filled 2018-06-02: qty 20

## 2018-06-02 NOTE — Telephone Encounter (Signed)
I will call in hydrocordone for him, let him know, thanks   [name] MD

## 2018-06-02 NOTE — Procedures (Signed)
Ultrasound-guided  therapeutic paracentesis performed yielding 5.3 liters of yellow fluid. No immediate complications. EBL none.

## 2018-06-02 NOTE — Telephone Encounter (Signed)
TCT patient and left vm message to tell him that a prescription for hydrocodone was sent in to his pharmacy per [name].

## 2018-06-02 NOTE — Telephone Encounter (Signed)
I left him a message.

## 2018-06-02 NOTE — Telephone Encounter (Signed)
Received vm message from patient regarding his pain symptoms, requested a call back. TCT patient and spoke with him.  He states he has been taking tramadol for his abdominal pain and the pain in his arms/legs from peripheral neuropathy. It is not effective at all in relieving the pain.  He is also on neurontin and again he states that medication is not helping either. Pt is going for paracentesis this afternoon. Advised that I would discuss with [name] and get back with him later this afternoon.  He voiced understanding

## 2018-06-02 NOTE — Progress Notes (Signed)
Received call from Tulia in [place]. She had gotten a call from patient stating he needed a paracentesis today.  He was scheduled for pleuryx insertion today but had not had COVID testing within the last 72 hrs. Discussed with Dr. Waldon Reining for paracentesis today.  Call made to central Radiology scheduling. Zacarias Pontes is booked all day, scheduler to check with [facility] and they will try to work him in today.  Scheduler to call patient with time of procedure. TCT patient to let him know to expect call from scheduler. No answer but was able to leave vm message for patient regarding call from radiology scheduler.  Left [facility] # [phone] for patient to call with any questions

## 2018-06-02 NOTE — Telephone Encounter (Signed)
No los per 5/21. °

## 2018-06-03 ENCOUNTER — Ambulatory Visit: Payer: Medicare Other | Admitting: Nurse Practitioner

## 2018-06-03 ENCOUNTER — Encounter: Payer: Self-pay | Admitting: Hematology

## 2018-06-03 ENCOUNTER — Telehealth: Payer: Self-pay | Admitting: Hematology

## 2018-06-03 ENCOUNTER — Ambulatory Visit: Payer: Medicare Other

## 2018-06-03 ENCOUNTER — Other Ambulatory Visit: Payer: Medicare Other

## 2018-06-03 DIAGNOSIS — C249 Malignant neoplasm of biliary tract, unspecified: Secondary | ICD-10-CM | POA: Diagnosis not present

## 2018-06-03 NOTE — Telephone Encounter (Signed)
Faxed medical records to [facility]. Release [id number]

## 2018-06-05 ENCOUNTER — Telehealth: Payer: Self-pay | Admitting: Hematology

## 2018-06-05 ENCOUNTER — Telehealth: Payer: Self-pay

## 2018-06-05 DIAGNOSIS — C7491 Malignant neoplasm of unspecified part of right adrenal gland: Secondary | ICD-10-CM | POA: Diagnosis not present

## 2018-06-05 NOTE — Telephone Encounter (Signed)
Phone call placed to patient's wife to introduce Palliative Care and offer to schedule a visit. VM left

## 2018-06-05 NOTE — Telephone Encounter (Signed)
Left message re 6/5 appointments.

## 2018-06-08 ENCOUNTER — Other Ambulatory Visit: Payer: Self-pay | Admitting: *Deleted

## 2018-06-08 ENCOUNTER — Other Ambulatory Visit: Payer: Self-pay | Admitting: Hematology

## 2018-06-08 ENCOUNTER — Encounter (HOSPITAL_COMMUNITY): Payer: Self-pay | Admitting: Radiology

## 2018-06-08 ENCOUNTER — Other Ambulatory Visit: Payer: Self-pay

## 2018-06-08 ENCOUNTER — Ambulatory Visit (HOSPITAL_COMMUNITY)
Admission: RE | Admit: 2018-06-08 | Discharge: 2018-06-08 | Disposition: A | Discharge status: Home / Self Care | Payer: Medicare Other | Source: Ambulatory Visit | Attending: Hematology | Admitting: Hematology

## 2018-06-08 ENCOUNTER — Other Ambulatory Visit (HOSPITAL_COMMUNITY)
Admission: RE | Admit: 2018-06-08 | Discharge: 2018-06-08 | Disposition: A | Discharge status: Home / Self Care | Payer: Medicare Other | Source: Ambulatory Visit | Attending: Gastroenterology | Admitting: Gastroenterology

## 2018-06-08 ENCOUNTER — Telehealth: Payer: Self-pay | Admitting: *Deleted

## 2018-06-08 DIAGNOSIS — Z1159 Encounter for screening for other viral diseases: Secondary | ICD-10-CM | POA: Insufficient documentation

## 2018-06-08 DIAGNOSIS — C224 Other sarcomas of liver: Secondary | ICD-10-CM

## 2018-06-08 DIAGNOSIS — R188 Other ascites: Secondary | ICD-10-CM | POA: Insufficient documentation

## 2018-06-08 DIAGNOSIS — C221 Intrahepatic bile duct carcinoma: Secondary | ICD-10-CM

## 2018-06-08 DIAGNOSIS — K7030 Alcoholic cirrhosis of liver without ascites: Secondary | ICD-10-CM

## 2018-06-08 HISTORY — PX: IR PARACENTESIS: IMG2679

## 2018-06-08 MED ORDER — LIDOCAINE HCL 1 % IJ SOLN
INTRAMUSCULAR | Status: AC | PRN
  Administered 2018-06-08: 5 mL

## 2018-06-08 MED ORDER — LIDOCAINE HCL 1 % IJ SOLN
INTRAMUSCULAR | Status: AC
  Filled 2018-06-08: qty 20

## 2018-06-08 NOTE — Procedures (Signed)
Ultrasound-guided  therapeutic paracentesis performed yielding 4.8 liters of yellow fluid. No immediate complications. EBL none.

## 2018-06-08 NOTE — Telephone Encounter (Signed)
Called patient and informed that he needs COVID Testing today before 3pm  Orders are in He will go today  Also gave him instructions for his procedure scheduled on Thursday

## 2018-06-09 ENCOUNTER — Encounter (HOSPITAL_COMMUNITY): Payer: Self-pay | Admitting: *Deleted

## 2018-06-09 ENCOUNTER — Other Ambulatory Visit: Payer: Self-pay

## 2018-06-10 LAB — NOVEL CORONAVIRUS, NAA (HOSP ORDER, SEND-OUT TO REF LAB; TAT 18-24 HRS): SARS-CoV-2, NAA: NOT DETECTED

## 2018-06-10 NOTE — Progress Notes (Signed)
[place]   [phone] [phone]) [phone]   Clinic Follow up Note   Patient Care Team: [name], MD as PCP - General (Internal Medicine)  Date of Service:  06/12/2018  CHIEF COMPLAINT: F/u on metastatic cholangiocarcinoma  SUMMARY OF ONCOLOGIC HISTORY: Oncology History   Cancer Staging Intrahepatic cholangiocarcinoma ([place]) Staging form: Intrahepatic Bile Duct, AJCC 8th Edition - Clinical stage from 10/29/2017: Stage IV (cT2, cN1, pM1) - Signed by [name], MD on 11/06/2017       Malignant neoplasm metastatic to adrenal gland [facility])    Initial Diagnosis    Malignant neoplasm metastatic to adrenal gland ([place])    10/23/2017 Imaging    ABD US IMPRESSION: Cirrhotic appearance of the liver.  2.6 cm questionable ill-defined mass versus area of focal parenchymal heterogeneity in segment 6 of the liver. If further imaging evaluation is desired, liver protocol abdominal MRI should be considered.  Splenomegaly.  Apparent thickening of the gallbladder wall, without evidence of cholelithiasis, and with negative sonographic Murphy's sign. The findings are favored to represent gallbladder wall edema secondary to systemic causes such as hyperproteinemia rather than acute cholecystitis. Please correlate clinically.     10/25/2017 Imaging    MRI IMPRESSION: 1. Advanced changes of cirrhosis. Interval development of multifocal enhancing liver lesions, which is concerning for multicentric hepatocellular carcinoma involving both lobes of liver. 2. Lesion within segment 4 B obstructs the right medial hepatic duct and left main duct resulting in moderate to marked biliary ductal dilatation within the left lobe and dome of liver. There may be partial obstruction of the right lateral duct with mild dilatation. 3. Tumor thrombus within the left branch of the portal vein likely secondary to segment 4 B tumor. 4. Bilateral adrenal metastasis and  retroperitoneal nodal metastasis. 5. Stigmata of portal venous hypertension including varices, ascites and splenomegaly. 6. These results will be called to the ordering clinician or representative by the Radiologist Assistant, and communication documented in the PACS or zVision Dashboard.    10/26/2017 Procedure    IMPRESSION: Status post formal PTC and internal/external biliary drainage.    10/27/2017 Imaging    IMPRESSION: Changes of cirrhosis. Multiple hepatic lesions are noted concerning for multifocal hepatoma or metastases. Biliary drain is in place with the tip in the distal common bile duct. There is mild intrahepatic biliary ductal dilatation. Bilateral adrenal nodules and masses, right larger than left compatible with metastases. Retroperitoneal adenopathy. Large stool burden with mild gaseous distention of the colon. Aortic atherosclerosis.     10/29/2017 Pathology Results    Diagnosis Soft Tissue Needle Core Biopsy, right adrenal mass - METASTATIC ADENOCARCINOMA, SEE COMMENT. Microscopic Comment There are malignant glands with central necrosis. Immunohistochemistry is positive for cytokeratin 20 and CDX-2. Cytokeratin 7, TTF-1 and NapsinA are negative. The morphology and immunoprofile favor a colorectal primary over pancreaticobiliary. [name] has reviewed the case. [name] was notified on 10/31/2017.    11/04/2017 Procedure    Colonoscopy impression - Seton in place found on digital rectal exam. - Congested mucosa in the rectum, in the sigmoid colon and in the descending colon. - A few diminutive polyps in the rectum and in the sigmoid colon. No colon or rectal mass identified. - No specimens collected.    02/19/2018 Imaging    CT CAP IMPRESSION: 1. Slight interval increase in size of left adrenal gland metastasis. Similar-appearing right adrenal gland metastasis. 2. While difficult to measure given ill-defined nature, infiltrative mass within the left  hepatic lobe  is grossly similar when compared to prior exam. Additional lesions within the liver are grossly similar. 3. Interval insertion of biliary drain within the left hepatic lobe with resultant decompression of the left hepatic lobe biliary system. 4. Similar-appearing sharply marginated consolidation within the right lower lobe which may represent infectious/inflammatory process or metastatic disease. 5. Large amount of stool throughout the colon as can be seen with constipation. 6. Persistent left hepatic lobe portal venous thrombosis. 7. Splenomegaly. 8. New moderate volume ascites.     Metastatic adenocarcinoma to liver [facility])   10/27/2017 Imaging    10/27/2017 CT AP IMPRESSION: Changes of cirrhosis. Multiple hepatic lesions are noted concerning for multifocal hepatoma or metastases.  Biliary drain is in place with the tip in the distal common bile duct. There is mild intrahepatic biliary ductal dilatation.  Bilateral adrenal nodules and masses, right larger than left compatible with metastases.  Retroperitoneal adenopathy.  Large stool burden with mild gaseous distention of the colon.  Aortic atherosclerosis.    10/29/2017 Cancer Staging    Staging form: Intrahepatic Bile Duct, AJCC 8th Edition - Clinical stage from 10/29/2017: Stage IV (cT2, cN1, pM1) - Signed by [name], MD on 11/06/2017    10/29/2017 Pathology Results    10/29/2017 Surgical Pathology Diagnosis Soft Tissue Needle Core Biopsy, right adrenal mass - METASTATIC ADENOCARCINOMA, SEE COMMENT.    11/06/2017 Initial Diagnosis    Intrahepatic cholangiocarcinoma ([place])    11/06/2017 Imaging    11/06/2017 CT AP IMPRESSION: 1. Increase in RIGHT pleural effusion. Stable masslike consolidation at the RIGHT lung base representing neoplasm versus pneumonia. 2. Stable metastatic nodule at the RIGHT lung base. 3. Infiltrative mass in the central LEFT hepatic lobe and discrete lesions in the  RIGHT hepatic lobe consistent with metastatic disease versus primary hepatic biliary carcinoma. Favor cholangiocarcinoma. 4. Percutaneous drainage catheter extends the porta hepatis. Small trapped pneumothorax along the tract of the catheter at the inferolateral RIGHT lung base. 5. Stable adrenal metastasis. 6. Metastatic adenopathy in the retroperitoneum unchanged. 7. Morphologic changes in liver consistent with  cirrhosis.    11/08/2017 Imaging    11/08/2017 CT AP IMPRESSION: 1. Overall no significant interval change since the CT of 11/06/2017. Stable positioning of the percutaneous biliary drain with similar degree of biliary ductal dilatation. 2. Cirrhosis with multiple hepatic metastatic disease. Infiltrative appearing lesion in the left lobe of the liver appears to extend into the left portal vein and may represent a tumor thrombus, or bland thrombus. A primary biliary neoplasm (cholangiocarcinoma, or metastatic disease are other considerations. 3. Retroperitoneal, mesenteric, and adrenal metastatic disease. 4. Hepatic colopathy versus less likely segmental colitis of the hepatic flexure. 5. Small right pleural effusion and bilateral lung base pulmonary masses/nodules as seen previously. 6. Tiny pockets of pneumoperitoneum lateral to the liver along the percutaneous drain, decreased since the prior CT. 7. Other findings as above.    11/20/2017 - 11/24/2017 Hospital Admission    Admit date: 11/08/2017 Admission diagnosis: fatigue Additional comments: discharged 11/24/2017    12/18/2017 - 02/14/2018 Chemotherapy    first line chemo gemcitabine/cisplatin on days 1 and 15 every 28 days starting 12/18/17. Stopped 02/14/18 after cycle 3.     12/22/2017 - 12/30/2017 Hospital Admission    Admit date: 12/22/2017 Admission diagnosis: Dysuria    12/22/2017 Imaging    12/22/2017 CT AP IMPRESSION: 1. Prominently distended and otherwise normal urinary bladder with mild prostatomegaly.  Findings suggest acute bladder outlet obstruction. No hydronephrosis. 2. Large colorectal stool volume compatible with constipation.  New mild circumferential rectal wall thickening and perirectal fat haziness suggesting stercoral colitis. No free air. No abscess. 3. Stable infiltrative cholangiocarcinoma in the central left liver lobe with associated peripheral intrahepatic biliary ductal dilatation throughout the left liver lobe. 4. Stable well-positioned internal external drain traversing the right liver lobe and decompressing the right liver biliary system. 5. Stable liver, bilateral adrenal, retroperitoneal, left mesenteric and right infrahilar nodal and left lung base metastases. No progressive metastatic disease since 12/03/2017 CT. 6. Cirrhosis. Trace perihepatic ascites, decreased. Stable moderate splenomegaly. 7.  Aortic Atherosclerosis (ICD10-I70.0).    03/11/2018 -  Chemotherapy    Second lineFOLFOX every 2 weeks started 03/11/2018. Held after cycle 5 due to GI bleeding from Varices.     05/13/2018 Procedure    Upper Endoscopy by Dr Carlean Purl 05/13/18  IMPRESSION - Grade II and large (> 5 mm) esophageal varices. Completely eradicated. Banded. Nipples seen no visible bleeding but he was vomiting blood as could only lightly sedate due to shock. Was not intubated as he was DNR and declined intubation. Wife updateed by phone pre and post procedure - Red blood in the entire stomach. - No specimens collected.      CURRENT THERAPY:  Palliative home care  INTERVAL HISTORY:  Matthew Lewis is here for a follow up. He presents to the clinic alone. He called his wife to be included in the visit today. He has felt better after paracentesis and plans to have one on 6/8. He does not want pleurx. His biliary drains are doing well. He drains 1L a day together. He notes right bile leakage when he cleans drain. He notes he drinks 2 Liters a day.  He notes his abdominal pain is worsening. He  is currently taking Hydrocodone which is not enough for him.  He notes his GI banding went well. He denies any GI bleeding or black stool. He notes his appetite is fair. He is trying to eat adequate meals. He notes having no energy.  He can still do ALDs, but not much else. He is mostly sitting. He denies any bed sores. His wife is concerned with his fatigue and energy level and is interested in anything that could help. He does not take iron pill but does take multivitamin.  He is set up for palliative home care but has not seen anyone yet.    REVIEW OF SYSTEMS:   Constitutional: Denies fevers, chills or abnormal weight loss (+) Low energy/fatigue  Eyes: Denies blurriness of vision Ears, nose, mouth, throat, and face: Denies mucositis or sore throat Respiratory: Denies cough, dyspnea or wheezes Cardiovascular: Denies palpitation, chest discomfort (+) Mild ankle lower extremity swelling Gastrointestinal:  Denies nausea, heartburn or change in bowel habits (+) abdominal pain worsened  MSK: (+) chronic Back pain  Skin: Denies abnormal skin rashes Lymphatics: Denies new lymphadenopathy or easy bruising Neurological:Denies numbness, tingling or new weaknesses Behavioral/Psych: Mood is stable, no new changes  All other systems were reviewed with the patient and are negative.  MEDICAL HISTORY:  Past Medical History:  Diagnosis Date  . Abscess of anal and rectal regions    horse shoe abscess  . Alcoholic cirrhosis ([place])   . Arthritis    "everywhere; especially in my spine"  . Cirrhosis ([place])    STAGE 1 CIRRHOSIS PATIENT SEES DR NAMDIGAM FOR  . Colon cancer ([place]) 05/2005   TX SURGERY WITH LYMPH NODE REMOVAL  . Diabetes mellitus without complication ([place])    type 2  . DM  type 2 with diabetic peripheral neuropathy ([facility])    left foot  . Fatigue   . Fistula, anal    multiple  . History of substance abuse ([place])    last alcohol was 08/2011; last marijuania was 08/2011  . Hypertension    no bp  meds in last year  . PTSD (post-traumatic stress disorder)    "severe" HX OF  . Sleep deprivation     SURGICAL HISTORY: Past Surgical History:  Procedure Laterality Date  . ABSCESS DRAINAGE     "probably 15-20 so far at least; rectal"  . ANAL FISTULECTOMY  12/13/1998  . ANTERIOR FUSION CERVICAL SPINE  2010   "triple"; "did 2 surgeries on the same day in 1275 due to complications"  . CORONARY ANGIOPLASTY  2001  . detached muscle  2010   right chest; "after cervical fusion complications"  . ELBOW SURGERY  2007   "cut out part of a muscle"; left  . ESOPHAGEAL BANDING  02/25/2018   Procedure: ESOPHAGEAL BANDING;  Surgeon: Ladene Artist, MD;  [facility]COPY;  Service: Endoscopy;;  . ESOPHAGEAL BANDING  05/12/2018   Procedure: ESOPHAGEAL BANDING;  Surgeon: Gatha Mayer, MD;  Location: [facility] ENDOSCOPY;  Service: Endoscopy;;  . ESOPHAGEAL BANDING  05/14/2018   Procedure: ESOPHAGEAL BANDING;  Surgeon: Yetta Flock, MD;  Location: [facility] ENDOSCOPY;  Service: Gastroenterology;;  . ESOPHAGOGASTRODUODENOSCOPY N/A 05/12/2018   Procedure: ESOPHAGOGASTRODUODENOSCOPY (EGD);  Surgeon: Gatha Mayer, MD;  Location: [facility] ENDOSCOPY;  Service: Endoscopy;  Laterality: N/A;  . ESOPHAGOGASTRODUODENOSCOPY (EGD) WITH PROPOFOL N/A 02/25/2018   Procedure: ESOPHAGOGASTRODUODENOSCOPY (EGD) WITH PROPOFOL;  Surgeon: Ladene Artist, MD;  Location[facility] ENDOSCOPY;  Service: Endoscopy;  Laterality: N/A;  . ESOPHAGOGASTRODUODENOSCOPY (EGD) WITH PROPOFOL N/A 05/14/2018   Procedure: ESOPHAGOGASTRODUODENOSCOPY (EGD) WITH PROPOFOL;  Surgeon: Yetta Flock, MD;  Location: [facility] ENDOSCOPY;  Service: Gastroenterology;  Laterality: N/A;  . EXAMINATION UNDER ANESTHESIA  03/22/2005   fistula  . EXAMINATION UNDER ANESTHESIA  06/06/2011   Procedure: EXAM UNDER ANESTHESIA;  Surgeon: Stark Klein, MD;  Location: [place];  Service: General;  Laterality: N/A;  . HEMICOLECTOMY  06/06/2005   left  . INCISION AND DRAINAGE  PERIRECTAL ABSCESS  03/30/2005  . INCISION AND DRAINAGE PERIRECTAL ABSCESS  12/13/2004  . INCISION AND DRAINAGE PERIRECTAL ABSCESS  07/14/2001  . INCISION AND DRAINAGE PERIRECTAL ABSCESS  08/12/2010   horseshoe abscess; Dr Redmond Pulling  . INCISION AND DRAINAGE PERIRECTAL ABSCESS  06/06/2011   Procedure: IRRIGATION AND DEBRIDEMENT PERIRECTAL ABSCESS;  Surgeon: Stark Klein, MD;  Location: [place];  Service: General;  Laterality: N/A;  . INCISION AND DRAINAGE PERIRECTAL ABSCESS N/A 02/24/2017   Procedure: IRRIGATION AND DEBRIDEMENT PERIRECTAL ABSCESS;  Surgeon: Ralene Ok, MD;  Location: [place];  Service: General;  Laterality: N/A;  . IR CATHETER TUBE CHANGE  02/19/2018  . IR CHOLANGIOGRAM EXISTING TUBE  12/23/2017  . IR CHOLANGIOGRAM EXISTING TUBE  01/02/2018  . IR CHOLANGIOGRAM EXISTING TUBE  05/04/2018  . IR EXCHANGE BILIARY DRAIN  11/07/2017  . IR EXCHANGE BILIARY DRAIN  12/09/2017  . IR EXCHANGE BILIARY DRAIN  02/03/2018  . IR EXCHANGE BILIARY DRAIN  02/03/2018  . IR EXCHANGE BILIARY DRAIN  04/03/2018  . IR EXCHANGE BILIARY DRAIN  04/03/2018  . IR EXCHANGE BILIARY DRAIN  04/23/2018  . IR EXCHANGE BILIARY DRAIN  04/28/2018  . IR EXCHANGE BILIARY DRAIN  04/28/2018  . IR FLUORO RM 30-60 MIN  04/15/2018  . IR IMAGING GUIDED PORT INSERTION  12/09/2017  .  IR INT EXT BILIARY DRAIN WITH CHOLANGIOGRAM  10/26/2017  . IR INT EXT BILIARY DRAIN WITH CHOLANGIOGRAM  12/23/2017  . IR PARACENTESIS  02/25/2018  . IR PARACENTESIS  05/26/2018  . IR PARACENTESIS  06/02/2018  . IR PARACENTESIS  06/08/2018  . IR PATIENT EVAL TECH 0-60 MINS  12/02/2017  . IR RADIOLOGIST EVAL & MGMT  11/06/2017  . IR RADIOLOGIST EVAL & MGMT  01/05/2018  . [place]  . PERCUTANEOUS PINNING PHALANX FRACTURE OF HAND  ~ 2008   "plates in 2 places"; right  . PLACEMENT OF SETON N/A 09/05/2017   Procedure: PLACEMENT OF SETON;  Surgeon: Leighton Ruff, MD;  Location: [facility];  Service: General;  Laterality: N/A;  .  RADIOLOGY WITH ANESTHESIA N/A 10/25/2017   Procedure: MRI WITH ANESTHESIA;  Surgeon: Radiologist, Medication, MD;  Location: [place];  Service: Radiology;  Laterality: N/A;  . THORACIC DISCECTOMY  1990's    I have reviewed the social history and family history with the patient and they are unchanged from previous note.  ALLERGIES:  is allergic to morphine and related.  MEDICATIONS:  Current Outpatient Medications  Medication Sig Dispense Refill  . Ca Carbonate-Mag Hydroxide (ROLAIDS PO) Take 1-2 tablets by mouth 2 (two) times daily as needed (heartburn).    . DULoxetine (CYMBALTA) 30 MG capsule Take 30 mg by mouth 2 (two) times daily.    . fexofenadine-pseudoephedrine (ALLEGRA-D) 60-120 MG 12 hr tablet Take 1 tablet by mouth daily.    Marland Kitchen gabapentin (NEURONTIN) 400 MG capsule Take 400-800 mg by mouth See admin instructions. Take 400 mg by mouth in the morning and 800 mg in the evening    . HYDROcodone-acetaminophen (NORCO) 5-325 MG tablet Take 1 tablet by mouth every 6 (six) hours as needed for moderate pain. 30 tablet 0  . insulin detemir (LEVEMIR) 100 UNIT/ML injection Inject 10-62 Units into the skin See admin instructions. Take 62units in the am and 10units in the pm    . levocetirizine (XYZAL) 5 MG tablet Take 5 mg by mouth every evening.    . lidocaine (LIDODERM) 5 % Place 1 patch onto the skin daily. Remove & Discard patch within 12 hours or as directed by MD 30 patch 0  . lidocaine-prilocaine (EMLA) cream Apply to port site 1 hour prior to access (Patient taking differently: Apply 1 application topically once. Apply to port site 1 hour prior to access) 30 g 0  . Multiple Vitamin (MULTIVITAMIN) tablet Take 1 tablet by mouth daily.    . nadolol (CORGARD) 20 MG tablet Take 1 tablet (20 mg total) by mouth daily. 30 tablet 11  . pantoprazole (PROTONIX) 40 MG tablet Take 1 tablet (40 mg total) by mouth daily at 6 (six) AM. 30 tablet 2  . tamsulosin (FLOMAX) 0.4 MG CAPS capsule TAKE 1  CAPSULE(0.4 MG) BY MOUTH DAILY AFTER SUPPER (Patient taking differently: Take 0.4 mg by mouth daily after supper. ) 90 capsule 0  . diphenoxylate-atropine (LOMOTIL) 2.5-0.025 MG tablet Take 1 tablet by mouth 4 (four) times daily as needed for diarrhea or loose stools. (Patient not taking: Reported on 06/12/2018) 30 tablet 0  . morphine (MSIR) 15 MG tablet Take 0.5 tablets (7.5 mg total) by mouth every 6 (six) hours as needed for severe pain. 40 tablet 0  . sodium chloride 0.9 % injection Inject 10 mLs into the vein daily. 300 mL 2   No current facility-administered medications for this visit.  PHYSICAL EXAMINATION: ECOG PERFORMANCE STATUS: 3 - Symptomatic, >50% confined to bed  Vitals:   06/12/18 1409  BP: 112/72  Pulse: (!) 115  Resp: 18  Temp: 98.7 F (37.1 C)  SpO2: 99%   Filed Weights   06/12/18 1409  Weight: 197 lb 9.6 oz (89.6 kg)    GENERAL:alert, no distress and comfortable SKIN: skin color, texture, turgor are normal, no rashes or significant lesions EYES: normal, Conjunctiva are pink and non-injected, sclera clear  NECK: supple, thyroid normal size, non-tender, without nodularity LYMPH:  no palpable lymphadenopathy in the cervical, axillary  LUNGS: clear to auscultation and percussion with normal breathing effort HEART: regular rate & rhythm and no murmurs (+) Mild b/l ankle edema ABDOMEN:abdomen soft, non-tender and normal bowel sounds Musculoskeletal:no cyanosis of digits and no clubbing  NEURO: alert & oriented x 3 with fluent speech, no focal motor/sensory deficits  LABORATORY DATA:  I have reviewed the data as listed CBC Latest Ref Rng & Units 06/12/2018 05/28/2018 05/26/2018  WBC 4.0 - 10.5 K/uL 7.4 7.4 10.0  Hemoglobin 13.0 - 17.0 g/dL 8.2(L) 8.3(L) 9.1(L)  Hematocrit 39.0 - 52.0 % 26.2(L) 25.8(L) 29.0(L)  Platelets 150 - 400 K/uL 199 169 181     CMP Latest Ref Rng & Units 06/12/2018 05/28/2018 05/26/2018  Glucose 70 - 99 mg/dL 132(H) 190(H) 215(H)  BUN 8 -  23 mg/dL 9 10 8   Creatinine 0.61 - 1.24 mg/dL 0.66 0.67 0.77  Sodium 135 - 145 mmol/L 130(L) 130(L) 132(L)  Potassium 3.5 - 5.1 mmol/L 4.1 3.6 3.8  Chloride 98 - 111 mmol/L 98 101 101  CO2 22 - 32 mmol/L 23 23 23   Calcium 8.9 - 10.3 mg/dL 8.0(L) 8.0(L) 8.4(L)  Total Protein 6.5 - 8.1 g/dL 7.1 6.7 7.0  Total Bilirubin 0.3 - 1.2 mg/dL 1.3(H) 1.3(H) 1.8(H)  Alkaline Phos 38 - 126 U/L 313(H) 334(H) 308(H)  AST 15 - 41 U/L 31 26 27   ALT 0 - 44 U/L 15 23 26     EGD by Dr Silverio Decamp 06/11/18  IMPRESSION - Non-bleeding grade II esophageal varices. Completely eradicated. Banded. - Portal hypertensive gastropathy. - Normal examined duodenum. - No specimens collected.   RADIOGRAPHIC STUDIES: I have personally reviewed the radiological images as listed and agreed with the findings in the report. No results found.   ASSESSMENT & PLAN:  Matthew Lewis is a 63 y.o. male with    1. Metastaticadenocarcinoma in liver, intrahepatic cholangiocarcinomavs metastatic colon cancer to right adrenal gland, liver, lungs and nodes -Diagnosed in 10/2017. He had 2 biliary drainage placements. His cancer treatment was postponed due to multiple hospitalization for infection, dehydration, and GI bleeding, etc.  -His biopsy morphology and IHC favor colorectal primary over pancreaticobiliary primary, however his repeated colonoscopy was negative. He had colon cancer 12 years ago.  -Hewasonfirst lineCisplatin and gemcitabinebut stopped on 02/20/2018 because of disease progression. -He startedsecond linetreatmentFOLFOXon 03/11/2018 and chemo held after cycle 5 (05/07/18) due to severe GI bleeding from Varices. -He developed large ascites, requiring frequent paracentesis  -He continues to have low performance status with significant fatigue, very limited activities. -Due to overall deterioration since the varices bleeding, he is not a candidate for more chemotherapy. I discussed palliative care and hospice in  length. He is open to it. After discussion with his wife, he opted to have palliative home care for now  -Pt and his wife wishes to have more cancer treatment. His FO results are still pending which can indicate if he is  eligible for target therapy. He is not eligible for immunotherapy due to normal MMR.  -Labs reviewed, CBC and CMP WNL except Hg 8.2, BG 132, Ca 8, albumin 2. Ca 19.9 still pending.  -For his fatigue I encouraged him to eat more protein in diet, take oral iron, and to remain as active as possible at home prevent atrophy. Will try albumin infusion after his paracentesis. He is agreeable. I discussed this will not change his fatigue dramatically but may help.  -f/u 2 weeks   2. Abdominal Pain secondary to #1, Chronic Lower Back pain -MRI spine was negative for fracture -His abdominal pain has worsened.  -He is currently on hydrocodone which is no longer controlling his pain, he will stop it. I will prescribe him Morphine 52m q6hour as needed. I instructed him to start with half tablets. (06/12/18)   3.Liver cirrhosis secondary to alcohol and NASH, recurrent Ascites -Has worsened lately and lead to recent severe GI bleeding from Varices. GI bleeding has currently stopped.  -He has significant abdominal ascites on exam previously with significant increase in weight.  -He also had crackles at base of lungs likely secondary to ascites. I encouraged him to practice deep breathing to open up his lungs. -He has had frequent procedures. I suggested he get pleurx placed, he declined. He will continue with paracentesis, now almost weekly.  -Last Paracentesis showed 4.8L removed.Will repeat on 06/15/18. May add albumin infusion after procedures.    4. GIbleeding fromduodenal ulcersandVarices -Patient developed upper GI bleeding on February 24, 2018, required hospitalization, and multiple blood transfusion. -Bleeding has stopped and is possibly related to NSAIDs  -Continue Protonix  twice daily indefinitelyper GI Dr. SFuller Plan-He had severe upper GI bleeding from varices whichrequiredurgent banding on 05/13/18 and blood transfusions.Bleeding has stoppedsince. He underwent EGD 06/11/18 with Dr. NSilverio Decampfor banding.    5.Obstructive jaundice, status post bilateral percutaneous biliary drain  -drained tube exchange1/28, both were capped.  -continue monitoring LFTs. He is only mildly jaundiced.  -After previous right biliary drain replacement he continued to have high output leading to persistent leaking at site. This caused him skin irritation. It improved after he followed up with IR.  -Right Biliary drain only leaks when cleaning, which can be from his ascites. He is willing to remove drains if no longer tolerable.  -He is currently draining 1L daily. I encouraged him to drink 2-3L daily.   6. Portal vein thrombosis -He is on aspirin, no anticoagulation due toinitialbloody biliary drainageand recent severe varices bleeding  7. DM, HTN -f/u with PCP  8.H/o substance abuse and addiction -He's been clean for 6 years now -will becautious about narcoticsandbenzo. Pt does not appear to seek narcotics   9. Anemia  -Secondary to chemotherapy, recent GI bleedingand underlying malignancy -He received 5 unit blood transfusion during his hospitalization for GI bleeding, anemia improved. -Hg8.2today, no need for blood transfusion. (06/12/18). He has been very fatigued which anemia can contribute to. I recommend he start oral iron supplement. At this time I do not recommend Aranesp injection.   10.Goal of care discussion, DNR/DNI -Weagain discussed code status in length.I recommendedDNR/DNI,he agreed.  -He understands his cancer is not curable, and the goal of therapy is to prolong his life and improve his quality of life. -I recommend he get a living will and POA in order. He agrees.    Plan -I refilled his Protonix and I prescribed him Morphine  to replace Norco today  -Next paracentesis on 6/8.  -IV  albumin on 6/8 or 6/15 -Lab, flush and f/u with me or Lacie on 6/15    No problem-specific Assessment & Plan notes found for this encounter.   No orders of the defined types were placed in this encounter.  All questions were answered. The patient knows to call the clinic with any problems, questions or concerns. No barriers to learning was detected. I spent 20 minutes counseling the patient face to face. The total time spent in the appointment was 25 minutes and more than 50% was on counseling and review of test results     [name], MD 06/12/2018   I, Joslyn Devon, am acting as scribe for [name], MD.   I have reviewed the above documentation for accuracy and completeness, and I agree with the above.

## 2018-06-11 ENCOUNTER — Ambulatory Visit (HOSPITAL_COMMUNITY): Payer: Medicare Other | Admitting: Anesthesiology

## 2018-06-11 ENCOUNTER — Encounter (HOSPITAL_COMMUNITY)
Admission: RE | Disposition: A | Discharge status: Home / Self Care | Payer: Self-pay | Source: Home / Self Care | Attending: Gastroenterology

## 2018-06-11 ENCOUNTER — Ambulatory Visit (HOSPITAL_COMMUNITY)
Admission: RE | Admit: 2018-06-11 | Discharge: 2018-06-11 | Disposition: A | Discharge status: Home / Self Care | Payer: Medicare Other | Attending: Gastroenterology | Admitting: Gastroenterology

## 2018-06-11 ENCOUNTER — Encounter (HOSPITAL_COMMUNITY): Payer: Self-pay

## 2018-06-11 ENCOUNTER — Other Ambulatory Visit: Payer: Self-pay

## 2018-06-11 DIAGNOSIS — Z8249 Family history of ischemic heart disease and other diseases of the circulatory system: Secondary | ICD-10-CM | POA: Insufficient documentation

## 2018-06-11 DIAGNOSIS — Z809 Family history of malignant neoplasm, unspecified: Secondary | ICD-10-CM | POA: Insufficient documentation

## 2018-06-11 DIAGNOSIS — Z794 Long term (current) use of insulin: Secondary | ICD-10-CM | POA: Diagnosis not present

## 2018-06-11 DIAGNOSIS — Z981 Arthrodesis status: Secondary | ICD-10-CM | POA: Insufficient documentation

## 2018-06-11 DIAGNOSIS — F4310 Post-traumatic stress disorder, unspecified: Secondary | ICD-10-CM | POA: Insufficient documentation

## 2018-06-11 DIAGNOSIS — Z87891 Personal history of nicotine dependence: Secondary | ICD-10-CM | POA: Diagnosis not present

## 2018-06-11 DIAGNOSIS — C229 Malignant neoplasm of liver, not specified as primary or secondary: Secondary | ICD-10-CM | POA: Diagnosis not present

## 2018-06-11 DIAGNOSIS — F419 Anxiety disorder, unspecified: Secondary | ICD-10-CM | POA: Diagnosis not present

## 2018-06-11 DIAGNOSIS — K3189 Other diseases of stomach and duodenum: Secondary | ICD-10-CM | POA: Diagnosis not present

## 2018-06-11 DIAGNOSIS — C221 Intrahepatic bile duct carcinoma: Secondary | ICD-10-CM | POA: Diagnosis not present

## 2018-06-11 DIAGNOSIS — I10 Essential (primary) hypertension: Secondary | ICD-10-CM | POA: Diagnosis not present

## 2018-06-11 DIAGNOSIS — K766 Portal hypertension: Secondary | ICD-10-CM | POA: Insufficient documentation

## 2018-06-11 DIAGNOSIS — Z85038 Personal history of other malignant neoplasm of large intestine: Secondary | ICD-10-CM | POA: Diagnosis not present

## 2018-06-11 DIAGNOSIS — Z79899 Other long term (current) drug therapy: Secondary | ICD-10-CM | POA: Insufficient documentation

## 2018-06-11 DIAGNOSIS — G709 Myoneural disorder, unspecified: Secondary | ICD-10-CM | POA: Diagnosis not present

## 2018-06-11 DIAGNOSIS — E1142 Type 2 diabetes mellitus with diabetic polyneuropathy: Secondary | ICD-10-CM | POA: Diagnosis not present

## 2018-06-11 DIAGNOSIS — K7030 Alcoholic cirrhosis of liver without ascites: Secondary | ICD-10-CM

## 2018-06-11 DIAGNOSIS — K7031 Alcoholic cirrhosis of liver with ascites: Secondary | ICD-10-CM | POA: Diagnosis not present

## 2018-06-11 DIAGNOSIS — D649 Anemia, unspecified: Secondary | ICD-10-CM | POA: Diagnosis not present

## 2018-06-11 DIAGNOSIS — I8510 Secondary esophageal varices without bleeding: Secondary | ICD-10-CM | POA: Diagnosis not present

## 2018-06-11 DIAGNOSIS — M1990 Unspecified osteoarthritis, unspecified site: Secondary | ICD-10-CM | POA: Insufficient documentation

## 2018-06-11 DIAGNOSIS — C224 Other sarcomas of liver: Secondary | ICD-10-CM

## 2018-06-11 DIAGNOSIS — I8500 Esophageal varices without bleeding: Secondary | ICD-10-CM | POA: Diagnosis not present

## 2018-06-11 HISTORY — PX: ESOPHAGOGASTRODUODENOSCOPY (EGD) WITH PROPOFOL: SHX5813

## 2018-06-11 HISTORY — DX: Type 2 diabetes mellitus without complications: E11.9

## 2018-06-11 HISTORY — PX: ESOPHAGEAL BANDING: SHX5518

## 2018-06-11 SURGERY — ESOPHAGOGASTRODUODENOSCOPY (EGD) WITH PROPOFOL
Anesthesia: Monitor Anesthesia Care

## 2018-06-11 MED ORDER — LIDOCAINE 2% (20 MG/ML) 5 ML SYRINGE
INTRAMUSCULAR | Status: DC | PRN
  Administered 2018-06-11: 100 mg via INTRAVENOUS

## 2018-06-11 MED ORDER — PROPOFOL 500 MG/50ML IV EMUL
INTRAVENOUS | Status: DC | PRN
  Administered 2018-06-11: 125 ug/kg/min via INTRAVENOUS

## 2018-06-11 MED ORDER — SODIUM CHLORIDE 0.9 % IV SOLN: INTRAVENOUS | Status: DC

## 2018-06-11 MED ORDER — PROPOFOL 10 MG/ML IV BOLUS
INTRAVENOUS | Status: DC | PRN
  Administered 2018-06-11: 40 mg via INTRAVENOUS

## 2018-06-11 MED ORDER — NADOLOL 20 MG PO TABS: 20.0000 mg | ORAL_TABLET | Freq: Every day | ORAL | 11 refills | Status: DC

## 2018-06-11 MED ORDER — LACTATED RINGERS IV SOLN
INTRAVENOUS | Status: DC | PRN
  Administered 2018-06-11: 14:00:00 via INTRAVENOUS

## 2018-06-11 MED ORDER — HEPARIN SOD (PORK) LOCK FLUSH 100 UNIT/ML IV SOLN
500.0000 [IU] | INTRAVENOUS | Status: AC | PRN
  Administered 2018-06-11: 500 [IU]

## 2018-06-11 SURGICAL SUPPLY — 14 items

## 2018-06-11 NOTE — Discharge Instructions (Signed)
YOU HAD AN ENDOSCOPIC PROCEDURE TODAY: Refer to the procedure report and other information in the discharge instructions given to you for any specific questions about what was found during the examination. If this information does not answer your questions, please call [place] office at [phone] to clarify.   YOU SHOULD EXPECT: Some feelings of bloating in the abdomen. Passage of more gas than usual. Walking can help get rid of the air that was put into your GI tract during the procedure and reduce the bloating. If you had a lower endoscopy (such as a colonoscopy or flexible sigmoidoscopy) you may notice spotting of blood in your stool or on the toilet paper. Some abdominal soreness may be present for a day or two, also.  DIET: Your first meal following the procedure should be a light meal and then it is ok to progress to your normal diet. A half-sandwich or bowl of soup is an example of a good first meal. Heavy or fried foods are harder to digest and may make you feel nauseous or bloated. Drink plenty of fluids but you should avoid alcoholic beverages for 24 hours. If you had a esophageal dilation, please see attached instructions for diet.    ACTIVITY: Your care partner should take you home directly after the procedure. You should plan to take it easy, moving slowly for the rest of the day. You can resume normal activity the day after the procedure however YOU SHOULD NOT DRIVE, use power tools, machinery or perform tasks that involve climbing or major physical exertion for 24 hours (because of the sedation medicines used during the test).   SYMPTOMS TO REPORT IMMEDIATELY: A gastroenterologist can be reached at any hour. Please call [phone]  for any of the following symptoms:   Following upper endoscopy (EGD, EUS, ERCP, esophageal dilation) Vomiting of blood or coffee ground material  New, significant abdominal pain  New, significant chest pain or pain under the shoulder blades  Painful or  persistently difficult swallowing  New shortness of breath  Black, tarry-looking or red, bloody stools  FOLLOW UP:  If any biopsies were taken you will be contacted by phone or by letter within the next 1-3 weeks. Call [phone]  if you have not heard about the biopsies in 3 weeks.  Please also call with any specific questions about appointments or follow up tests.  

## 2018-06-11 NOTE — H&P (Signed)
[name] History and Physical   Primary Care Physician:  [name], MD   Reason for Procedure:   Esophageal varices surveillance s/p variceal hemorrhage Plan:    Esophageal varices banding     HPI: Matthew Lewis is a 63 y.o. male metastatic cholangiocarcinoma, biliary obstruction status post PTC, doing thrombosis, decompensated cirrhosis, ascites, variceal hemorrhage status post banding May 6 &7, 2020.  The risks and benefits as well as alternatives of endoscopic procedure(s) have been discussed and reviewed. All questions answered. The patient agrees to proceed.     Past Medical History:  Diagnosis Date  . Abscess of anal and rectal regions    horse shoe abscess  . Alcoholic cirrhosis ([place])   . Arthritis    "everywhere; especially in my spine"  . Cirrhosis ([place])    STAGE 1 CIRRHOSIS PATIENT SEES DR NAMDIGAM FOR  . Colon cancer ([place]) 05/2005   TX SURGERY WITH LYMPH NODE REMOVAL  . Diabetes mellitus without complication ([place])    type 2  . DM type 2 with diabetic peripheral neuropathy ([facility])    left foot  . Fatigue   . Fistula, anal    multiple  . History of substance abuse ([place])    last alcohol was 08/2011; last marijuania was 08/2011  . Hypertension    no bp meds in last year  . PTSD (post-traumatic stress disorder)    "severe" HX OF  . Sleep deprivation     Past Surgical History:  Procedure Laterality Date  . ABSCESS DRAINAGE     "probably 15-20 so far at least; rectal"  . ANAL FISTULECTOMY  12/13/1998  . ANTERIOR FUSION CERVICAL SPINE  2010   "triple"; "did 2 surgeries on the same day in [id number] due to complications"  . CORONARY ANGIOPLASTY  2001  . detached muscle  2010   right chest; "after cervical fusion complications"  . ELBOW SURGERY  2007   "cut out part of a muscle"; left  . ESOPHAGEAL BANDING  02/25/2018   Procedure: ESOPHAGEAL BANDING;  Surgeon: Ladene Artist, MD;  Location: [facility] ENDOSCOPY;  Service: Endoscopy;;  . ESOPHAGEAL  BANDING  05/12/2018   Procedure: ESOPHAGEAL BANDING;  Surgeon: Gatha Mayer, MD;  Location: [facility] ENDOSCOPY;  Service: Endoscopy;;  . ESOPHAGEAL BANDING  05/14/2018   Procedure: ESOPHAGEAL BANDING;  Surgeon: Yetta Flock, MD;  Location: [facility] ENDOSCOPY;  Service: Gastroenterology;;  . ESOPHAGOGASTRODUODENOSCOPY N/A 05/12/2018   Procedure: ESOPHAGOGASTRODUODENOSCOPY (EGD);  Surgeon: Gatha Mayer, MD;  Location: [facility] ENDOSCOPY;  Service: Endoscopy;  Laterality: N/A;  . ESOPHAGOGASTRODUODENOSCOPY (EGD) WITH PROPOFOL N/A 02/25/2018   Procedure: ESOPHAGOGASTRODUODENOSCOPY (EGD) WITH PROPOFOL;  Surgeon: Ladene Artist, MD;  Location: [facility] ENDOSCOPY;  Service: Endoscopy;  Laterality: N/A;  . ESOPHAGOGASTRODUODENOSCOPY (EGD) WITH PROPOFOL N/A 05/14/2018   Procedure: ESOPHAGOGASTRODUODENOSCOPY (EGD) WITH PROPOFOL;  Surgeon: Yetta Flock, MD;  Location: [facility] ENDOSCOPY;  Service: Gastroenterology;  Laterality: N/A;  . EXAMINATION UNDER ANESTHESIA  03/22/2005   fistula  . EXAMINATION UNDER ANESTHESIA  06/06/2011   Procedure: EXAM UNDER ANESTHESIA;  Surgeon: Stark Klein, MD;  Location: [facility];  Service: General;  Laterality: N/A;  . HEMICOLECTOMY  06/06/2005   left  . INCISION AND DRAINAGE PERIRECTAL ABSCESS  03/30/2005  . INCISION AND DRAINAGE PERIRECTAL ABSCESS  12/13/2004  . INCISION AND DRAINAGE PERIRECTAL ABSCESS  07/14/2001  . INCISION AND DRAINAGE PERIRECTAL ABSCESS  08/12/2010   horseshoe abscess; Dr Redmond Pulling  . INCISION AND DRAINAGE PERIRECTAL ABSCESS  06/06/2011   Procedure: IRRIGATION  AND DEBRIDEMENT PERIRECTAL ABSCESS;  Surgeon: Stark Klein, MD;  Location: [facility];  Service: General;  Laterality: N/A;  . INCISION AND DRAINAGE PERIRECTAL ABSCESS N/A 02/24/2017   Procedure: IRRIGATION AND DEBRIDEMENT PERIRECTAL ABSCESS;  Surgeon: Ralene Ok, MD;  Location: [facility];  Service: General;  Laterality: N/A;  . IR CATHETER TUBE CHANGE  02/19/2018  . IR CHOLANGIOGRAM EXISTING TUBE  12/23/2017  . IR  CHOLANGIOGRAM EXISTING TUBE  01/02/2018  . IR CHOLANGIOGRAM EXISTING TUBE  05/04/2018  . IR EXCHANGE BILIARY DRAIN  11/07/2017  . IR EXCHANGE BILIARY DRAIN  12/09/2017  . IR EXCHANGE BILIARY DRAIN  02/03/2018  . IR EXCHANGE BILIARY DRAIN  02/03/2018  . IR EXCHANGE BILIARY DRAIN  04/03/2018  . IR EXCHANGE BILIARY DRAIN  04/03/2018  . IR EXCHANGE BILIARY DRAIN  04/23/2018  . IR EXCHANGE BILIARY DRAIN  04/28/2018  . IR EXCHANGE BILIARY DRAIN  04/28/2018  . IR FLUORO RM 30-60 MIN  04/15/2018  . IR IMAGING GUIDED PORT INSERTION  12/09/2017  . IR INT EXT BILIARY DRAIN WITH CHOLANGIOGRAM  10/26/2017  . IR INT EXT BILIARY DRAIN WITH CHOLANGIOGRAM  12/23/2017  . IR PARACENTESIS  02/25/2018  . IR PARACENTESIS  05/26/2018  . IR PARACENTESIS  06/02/2018  . IR PARACENTESIS  06/08/2018  . IR PATIENT EVAL TECH 0-60 MINS  12/02/2017  . IR RADIOLOGIST EVAL & MGMT  11/06/2017  . IR RADIOLOGIST EVAL & MGMT  01/05/2018  . [place]  . PERCUTANEOUS PINNING PHALANX FRACTURE OF HAND  ~ 2008   "plates in 2 places"; right  . PLACEMENT OF SETON N/A 09/05/2017   Procedure: PLACEMENT OF SETON;  Surgeon: Leighton Ruff, MD;  Location[facility];  Service: General;  Laterality: N/A;  . RADIOLOGY WITH ANESTHESIA N/A 10/25/2017   Procedure: MRI WITH ANESTHESIA;  Surgeon: Radiologist, Medication, MD;  Location: [place];  Service: Radiology;  Laterality: N/A;  . THORACIC DISCECTOMY  1990's    Prior to Admission medications   Medication Sig Start Date End Date Taking? Authorizing Provider  Ca Carbonate-Mag Hydroxide (ROLAIDS PO) Take 1-2 tablets by mouth 2 (two) times daily as needed (heartburn).   Yes [provider]  diphenoxylate-atropine (LOMOTIL) 2.5-0.025 MG tablet Take 1 tablet by mouth 4 (four) times daily as needed for diarrhea or loose stools. 05/26/18  Yes Alla Feeling, NP  DULoxetine (CYMBALTA) 30 MG capsule Take 30 mg by mouth 2 (two) times daily.   Yes [provider]   fexofenadine-pseudoephedrine (ALLEGRA-D) 60-120 MG 12 hr tablet Take 1 tablet by mouth daily.   Yes [provider]  gabapentin (NEURONTIN) 400 MG capsule Take 400-800 mg by mouth See admin instructions. Take 400 mg by mouth in the morning and 800 mg in the evening   Yes [provider]  HYDROcodone-acetaminophen (NORCO) 5-325 MG tablet Take 1 tablet by mouth every 6 (six) hours as needed for moderate pain. 06/02/18  Yes [name], MD  insulin detemir (LEVEMIR) 100 UNIT/ML injection Inject 10-62 Units into the skin See admin instructions. Take 62units in the am and 10units in the pm   Yes [provider]  levocetirizine (XYZAL) 5 MG tablet Take 5 mg by mouth every evening.   Yes [provider]  lidocaine (LIDODERM) 5 % Place 1 patch onto the skin daily. Remove & Discard patch within 12 hours or as directed by MD 05/17/18  Yes Shelly Coss, MD  lidocaine-prilocaine (EMLA) cream Apply to port site 1  hour prior to access Patient taking differently: Apply 1 application topically once. Apply to port site 1 hour prior to access 01/20/18  Yes [name], MD  Multiple Vitamin (MULTIVITAMIN) tablet Take 1 tablet by mouth daily.   Yes [provider]  pantoprazole (PROTONIX) 40 MG tablet Take 1 tablet (40 mg total) by mouth daily at 6 (six) AM. 05/18/18  Yes Adhikari, Amrit, MD  tamsulosin (FLOMAX) 0.4 MG CAPS capsule TAKE 1 CAPSULE(0.4 MG) BY MOUTH DAILY AFTER SUPPER Patient taking differently: Take 0.4 mg by mouth daily after supper.  03/12/18  Yes [name], MD    Current Facility-Administered Medications  Medication Dose Route Frequency Provider Last Rate Last Dose  . 0.9 %  sodium chloride infusion   Intravenous Continuous Mauri Pole, MD        Allergies as of 06/08/2018 - Review Complete 05/28/2018  Allergen Reaction Noted  . Morphine and related Other (See Comments) 10/24/2017    Family History  Problem Relation Age of Onset  . Heart disease  Father   . Heart disease Mother   . Cancer Mother   . Cancer Sister   . Heart disease Brother     Social History   Socioeconomic History  . Marital status: Married    Spouse name: Not on file  . Number of children: Not on file  . Years of education: Not on file  . Highest education level: Not on file  Occupational History  . Occupation: works at [employer]  . Financial resource strain: Not on file  . Food insecurity:    Worry: Not on file    Inability: Not on file  . Transportation needs:    Medical: Not on file    Non-medical: Not on file  Tobacco Use  . Smoking status: Former Smoker    Packs/day: 1.00    Years: 10.00    Pack years: 10.00    Types: Cigarettes    Last attempt to quit: 01/08/1991    Years since quitting: 27.4  . Smokeless tobacco: Never Used  Substance and Sexual Activity  . Alcohol use: Not Currently    Comment: 06/05/11 "drank enough when I was younger to last me my whole life; don't remember when I had my last drink, maybe 2012"  . Drug use: Not Currently    Types: Oxycodone, Marijuana    Comment: 06/05/11 "pot head in my younger days; 2009 dr had me on oxycodone and valium for almost 75yr  . Sexual activity: Not Currently  Lifestyle  . Physical activity:    Days per week: Not on file    Minutes per session: Not on file  . Stress: To some extent  Relationships  . Social connections:    Talks on phone: Not on file    Gets together: Not on file    Attends religious service: Not on file    Active member of club or organization: Not on file    Attends meetings of clubs or organizations: Not on file    Relationship status: Not on file  . Intimate partner violence:    Fear of current or ex partner: Not on file    Emotionally abused: Not on file    Physically abused: Not on file    Forced sexual activity: Not on file  Other Topics Concern  . Not on file  Social History Narrative  . Not on file    Review of Systems:  All  other review  of systems negative except as mentioned in the HPI.  Physical Exam: Vital signs in last 24 hours: Temp:  [98 F (36.7 C)] 98 F (36.7 C) (06/04 1321) Pulse Rate:  [101] 101 (06/04 1321) Resp:  [14] 14 (06/04 1321) BP: (114)/(70) 114/70 (06/04 1321) SpO2:  [99 %] 99 % (06/04 1321)   General:   Alert,  pleasant and cooperative in NAD Lungs:  Clear throughout to auscultation.   Heart:  Regular rate and rhythm; no murmurs, clicks, rubs,  or gallops. Abdomen:  Soft, distended.+bowel sounds.   Neuro/Psych:  Alert and cooperative. Normal mood and affect. A and O x 3   K. Denzil Magnuson , MD [phone]

## 2018-06-11 NOTE — Anesthesia Postprocedure Evaluation (Signed)
Anesthesia Post Note  Patient: Matthew Lewis  Procedure(s) Performed: ESOPHAGOGASTRODUODENOSCOPY (EGD) WITH PROPOFOL (N/A ) ESOPHAGEAL BANDING     Patient location during evaluation: Endoscopy Anesthesia Type: MAC Level of consciousness: awake and alert Pain management: pain level controlled Vital Signs Assessment: post-procedure vital signs reviewed and stable Respiratory status: spontaneous breathing, nonlabored ventilation, respiratory function stable and patient connected to nasal cannula oxygen Cardiovascular status: stable and blood pressure returned to baseline Postop Assessment: no apparent nausea or vomiting Anesthetic complications: no    Last Vitals:  Vitals:   06/11/18 1515 06/11/18 1525  BP: 102/74 126/79  Pulse: 93 95  Resp: 17 16  Temp:    SpO2: 99% 99%    Last Pain:  Vitals:   06/11/18 1506  TempSrc: Oral  PainSc: 0-No pain                 Wylie Russon

## 2018-06-11 NOTE — Op Note (Signed)
[facility] Patient Name: Matthew Lewis Procedure Date: 06/11/2018 MRN: [id number] Attending MD: Mauri Pole , MD Date of Birth: 07/24/1955 CSN: [id number] Age: 63 Admit Type: Outpatient Procedure:                Upper GI endoscopy Indications:              2nd degree variceal eradication (following bleed) Providers:                Mauri Pole, MD, Cleda Daub, RN, Cherylynn Ridges, Technician, Stephanie [place] CRNA Referring MD:              Medicines:                Monitored Anesthesia Care Complications:            No immediate complications. Estimated Blood Loss:     Estimated blood loss: none. Procedure:                Pre-Anesthesia Assessment:                           - Prior to the procedure, a History and Physical                            was performed, and patient medications and                            allergies were reviewed. The patient's tolerance of                            previous anesthesia was also reviewed. The risks                            and benefits of the procedure and the sedation                            options and risks were discussed with the patient.                            All questions were answered, and informed consent                            was obtained. Prior Anticoagulants: The patient has                            taken no previous anticoagulant or antiplatelet                            agents. ASA Grade Assessment: IV - A patient with                            severe systemic disease that is a constant threat  to life. After reviewing the risks and benefits,                            the patient was deemed in satisfactory condition to                            undergo the procedure.                           After obtaining informed consent, the endoscope was                            passed under direct vision. Throughout the                        procedure, the patient's blood pressure, pulse, and                            oxygen saturations were monitored continuously. The                            GIF-H190 ([id number]) Olympus gastroscope was                            introduced through the mouth, and advanced to the                            second part of duodenum. The upper GI endoscopy was                            accomplished without difficulty. The patient                            tolerated the procedure well. Scope In: Scope Out: Findings:      Two columns of non-bleeding grade II varices were found in the lower       third of the esophagus, 40 cm from the incisors. They were 6 mm in       largest diameter. Stigmata of prior treatment were evident. Two bands       were successfully placed with complete eradication, resulting in       deflation of varices. There was no bleeding during the procedure.      Mild portal hypertensive gastropathy was found in the entire examined       stomach.      The examined duodenum was normal. Impression:               - Non-bleeding grade II esophageal varices.                            Completely eradicated. Banded.                           - Portal hypertensive gastropathy.                           - Normal examined duodenum.                           -  No specimens collected. Moderate Sedation:      Not Applicable - Patient had care per Anesthesia. Recommendation:           - Resume previous diet.                           - Continue present medications.                           - Nadolol 20mg  daily                           - Continue Protonix                           - Follow up as needed Procedure Code(s):        --- Professional ---                           [phone], Esophagogastroduodenoscopy, flexible,                            transoral; with band ligation of esophageal/gastric                            varices Diagnosis Code(s):        ---  Professional ---                           I85.00, Esophageal varices without bleeding                           K76.6, Portal hypertension                           K31.89, Other diseases of stomach and duodenum CPT copyright 2019 American Medical Association. All rights reserved. The codes documented in this report are preliminary and upon coder review may  be revised to meet current compliance requirements. Mauri Pole, MD 06/11/2018 3:09:33 PM This report has been signed electronically. Number of Addenda: 0

## 2018-06-11 NOTE — Anesthesia Preprocedure Evaluation (Addendum)
Anesthesia Evaluation  Patient identified by MRN, date of birth, ID band Patient awake    Reviewed: Allergy & Precautions, NPO status , Patient's Chart, lab work & pertinent test results  History of Anesthesia Complications Negative for: history of anesthetic complications  Airway Mallampati: II  TM Distance: >3 FB Neck ROM: Full    Dental  (+) Dental Advisory Given   Pulmonary former smoker,    breath sounds clear to auscultation       Cardiovascular hypertension, (-) angina+ CAD   Rhythm:Regular  Colon cancer (HCC)   Neuro/Psych PSYCHIATRIC DISORDERS Anxiety  Neuromuscular disease    GI/Hepatic (+) Cirrhosis   ascites    , Colon cancer (HCC)   Endo/Other  diabetes, Type 2  Renal/GU Renal disease     Musculoskeletal  (+) Arthritis ,   Abdominal   Peds  Hematology  (+) Blood dyscrasia, anemia ,   Anesthesia Other Findings   Reproductive/Obstetrics                            Anesthesia Physical Anesthesia Plan  ASA: III  Anesthesia Plan: MAC   Post-op Pain Management:    Induction: Intravenous  PONV Risk Score and Plan: 1 and Treatment may vary due to age or medical condition and Propofol infusion  Airway Management Planned: Nasal Cannula  Additional Equipment: None  Intra-op Plan:   Post-operative Plan:   Informed Consent: I have reviewed the patients History and Physical, chart, labs and discussed the procedure including the risks, benefits and alternatives for the proposed anesthesia with the patient or authorized representative who has indicated his/her understanding and acceptance.     Dental advisory given  Plan Discussed with: CRNA and Surgeon  Anesthesia Plan Comments:         Anesthesia Quick Evaluation

## 2018-06-11 NOTE — Transfer of Care (Signed)
Immediate Anesthesia Transfer of Care Note  Patient: Matthew Lewis  Procedure(s) Performed: ESOPHAGOGASTRODUODENOSCOPY (EGD) WITH PROPOFOL (N/A ) ESOPHAGEAL BANDING  Patient Location: PACU and Endoscopy Unit  Anesthesia Type:MAC  Level of Consciousness: awake, alert  and oriented  Airway & Oxygen Therapy: Patient Spontanous Breathing and Patient connected to nasal cannula oxygen  Post-op Assessment: Report given to RN and Post -op Vital signs reviewed and stable  Post vital signs: Reviewed and stable  Last Vitals:  Vitals Value Taken Time  BP    Temp    Pulse    Resp    SpO2      Last Pain:  Vitals:   06/11/18 1321  TempSrc: Oral  PainSc: 0-No pain         Complications: No apparent anesthesia complications

## 2018-06-12 ENCOUNTER — Inpatient Hospital Stay: Payer: Medicare Other

## 2018-06-12 ENCOUNTER — Inpatient Hospital Stay: Payer: Medicare Other | Attending: Hematology | Admitting: Hematology

## 2018-06-12 ENCOUNTER — Other Ambulatory Visit: Payer: Self-pay

## 2018-06-12 ENCOUNTER — Telehealth: Payer: Self-pay | Admitting: Hematology

## 2018-06-12 ENCOUNTER — Other Ambulatory Visit: Payer: Self-pay | Admitting: *Deleted

## 2018-06-12 ENCOUNTER — Encounter (HOSPITAL_COMMUNITY): Payer: Self-pay | Admitting: Gastroenterology

## 2018-06-12 VITALS — BP 112/72 | HR 115 | Temp 98.7°F | Resp 18 | Ht 71.0 in | Wt 197.6 lb

## 2018-06-12 DIAGNOSIS — T451X5A Adverse effect of antineoplastic and immunosuppressive drugs, initial encounter: Secondary | ICD-10-CM | POA: Insufficient documentation

## 2018-06-12 DIAGNOSIS — M1990 Unspecified osteoarthritis, unspecified site: Secondary | ICD-10-CM | POA: Diagnosis not present

## 2018-06-12 DIAGNOSIS — I8510 Secondary esophageal varices without bleeding: Secondary | ICD-10-CM | POA: Diagnosis not present

## 2018-06-12 DIAGNOSIS — Z885 Allergy status to narcotic agent status: Secondary | ICD-10-CM | POA: Insufficient documentation

## 2018-06-12 DIAGNOSIS — C7971 Secondary malignant neoplasm of right adrenal gland: Secondary | ICD-10-CM

## 2018-06-12 DIAGNOSIS — K766 Portal hypertension: Secondary | ICD-10-CM | POA: Diagnosis not present

## 2018-06-12 DIAGNOSIS — C221 Intrahepatic bile duct carcinoma: Secondary | ICD-10-CM

## 2018-06-12 DIAGNOSIS — K7031 Alcoholic cirrhosis of liver with ascites: Secondary | ICD-10-CM | POA: Insufficient documentation

## 2018-06-12 DIAGNOSIS — F1111 Opioid abuse, in remission: Secondary | ICD-10-CM | POA: Insufficient documentation

## 2018-06-12 DIAGNOSIS — K3189 Other diseases of stomach and duodenum: Secondary | ICD-10-CM | POA: Insufficient documentation

## 2018-06-12 DIAGNOSIS — I10 Essential (primary) hypertension: Secondary | ICD-10-CM | POA: Insufficient documentation

## 2018-06-12 DIAGNOSIS — K831 Obstruction of bile duct: Secondary | ICD-10-CM | POA: Diagnosis not present

## 2018-06-12 DIAGNOSIS — I81 Portal vein thrombosis: Secondary | ICD-10-CM | POA: Insufficient documentation

## 2018-06-12 DIAGNOSIS — C7972 Secondary malignant neoplasm of left adrenal gland: Secondary | ICD-10-CM | POA: Diagnosis not present

## 2018-06-12 DIAGNOSIS — E1142 Type 2 diabetes mellitus with diabetic polyneuropathy: Secondary | ICD-10-CM | POA: Insufficient documentation

## 2018-06-12 DIAGNOSIS — Z79899 Other long term (current) drug therapy: Secondary | ICD-10-CM | POA: Diagnosis not present

## 2018-06-12 DIAGNOSIS — K920 Hematemesis: Secondary | ICD-10-CM | POA: Insufficient documentation

## 2018-06-12 DIAGNOSIS — E119 Type 2 diabetes mellitus without complications: Secondary | ICD-10-CM

## 2018-06-12 DIAGNOSIS — G8929 Other chronic pain: Secondary | ICD-10-CM | POA: Insufficient documentation

## 2018-06-12 DIAGNOSIS — E860 Dehydration: Secondary | ICD-10-CM | POA: Insufficient documentation

## 2018-06-12 DIAGNOSIS — R0989 Other specified symptoms and signs involving the circulatory and respiratory systems: Secondary | ICD-10-CM | POA: Insufficient documentation

## 2018-06-12 DIAGNOSIS — K7030 Alcoholic cirrhosis of liver without ascites: Secondary | ICD-10-CM

## 2018-06-12 DIAGNOSIS — I700 Atherosclerosis of aorta: Secondary | ICD-10-CM | POA: Insufficient documentation

## 2018-06-12 DIAGNOSIS — C772 Secondary and unspecified malignant neoplasm of intra-abdominal lymph nodes: Secondary | ICD-10-CM | POA: Insufficient documentation

## 2018-06-12 DIAGNOSIS — Z9221 Personal history of antineoplastic chemotherapy: Secondary | ICD-10-CM | POA: Insufficient documentation

## 2018-06-12 DIAGNOSIS — D6481 Anemia due to antineoplastic chemotherapy: Secondary | ICD-10-CM | POA: Insufficient documentation

## 2018-06-12 DIAGNOSIS — Z95828 Presence of other vascular implants and grafts: Secondary | ICD-10-CM

## 2018-06-12 DIAGNOSIS — M545 Low back pain: Secondary | ICD-10-CM | POA: Diagnosis not present

## 2018-06-12 DIAGNOSIS — Z85038 Personal history of other malignant neoplasm of large intestine: Secondary | ICD-10-CM | POA: Insufficient documentation

## 2018-06-12 DIAGNOSIS — C787 Secondary malignant neoplasm of liver and intrahepatic bile duct: Secondary | ICD-10-CM

## 2018-06-12 DIAGNOSIS — Z794 Long term (current) use of insulin: Secondary | ICD-10-CM

## 2018-06-12 LAB — CBC WITH DIFFERENTIAL (CANCER CENTER ONLY)
Abs Immature Granulocytes: 0.02 10*3/uL (ref 0.00–0.07)
Basophils Absolute: 0 10*3/uL (ref 0.0–0.1)
Basophils Relative: 0 %
Eosinophils Absolute: 0.1 10*3/uL (ref 0.0–0.5)
Eosinophils Relative: 2 %
HCT: 26.2 % — ABNORMAL LOW (ref 39.0–52.0)
Hemoglobin: 8.2 g/dL — ABNORMAL LOW (ref 13.0–17.0)
Immature Granulocytes: 0 %
Lymphocytes Relative: 7 %
Lymphs Abs: 0.5 10*3/uL — ABNORMAL LOW (ref 0.7–4.0)
MCH: 28.6 pg (ref 26.0–34.0)
MCHC: 31.3 g/dL (ref 30.0–36.0)
MCV: 91.3 fL (ref 80.0–100.0)
Monocytes Absolute: 0.8 10*3/uL (ref 0.1–1.0)
Monocytes Relative: 10 %
Neutro Abs: 6 10*3/uL (ref 1.7–7.7)
Neutrophils Relative %: 81 %
Platelet Count: 199 10*3/uL (ref 150–400)
RBC: 2.87 MIL/uL — ABNORMAL LOW (ref 4.22–5.81)
RDW: 16.7 % — ABNORMAL HIGH (ref 11.5–15.5)
WBC Count: 7.4 10*3/uL (ref 4.0–10.5)
nRBC: 0 % (ref 0.0–0.2)

## 2018-06-12 LAB — CMP (CANCER CENTER ONLY)
ALT: 15 U/L (ref 0–44)
AST: 31 U/L (ref 15–41)
Albumin: 2 g/dL — ABNORMAL LOW (ref 3.5–5.0)
Alkaline Phosphatase: 313 U/L — ABNORMAL HIGH (ref 38–126)
Anion gap: 9 (ref 5–15)
BUN: 9 mg/dL (ref 8–23)
CO2: 23 mmol/L (ref 22–32)
Calcium: 8 mg/dL — ABNORMAL LOW (ref 8.9–10.3)
Chloride: 98 mmol/L (ref 98–111)
Creatinine: 0.66 mg/dL (ref 0.61–1.24)
GFR, Est AFR Am: >60 mL/min (ref >60)
GFR, Estimated: >60 mL/min (ref >60)
Glucose, Bld: 132 mg/dL — ABNORMAL HIGH (ref 70–99)
Potassium: 4.1 mmol/L (ref 3.5–5.1)
Sodium: 130 mmol/L — ABNORMAL LOW (ref 135–145)
Total Bilirubin: 1.3 mg/dL — ABNORMAL HIGH (ref 0.3–1.2)
Total Protein: 7.1 g/dL (ref 6.5–8.1)

## 2018-06-12 MED ORDER — PANTOPRAZOLE SODIUM 40 MG PO TBEC: 40.0000 mg | DELAYED_RELEASE_TABLET | Freq: Every day | ORAL | 2 refills | Status: AC

## 2018-06-12 MED ORDER — MORPHINE SULFATE 15 MG PO TABS: 7.5000 mg | ORAL_TABLET | Freq: Four times a day (QID) | ORAL | 0 refills | Status: DC | PRN

## 2018-06-12 MED ORDER — SODIUM CHLORIDE 0.9 % IJ SOLN: 10.0000 mL | Freq: Every day | INTRAMUSCULAR | 2 refills | Status: DC

## 2018-06-12 MED ORDER — SODIUM CHLORIDE 0.9% FLUSH
10.0000 mL | INTRAVENOUS | Status: DC | PRN
  Administered 2018-06-12: 10 mL
  Filled 2018-06-12: qty 10

## 2018-06-12 MED ORDER — SODIUM CHLORIDE 0.9% FLUSH: 10.0000 mL | INTRAVENOUS | 2 refills | Status: DC | PRN

## 2018-06-12 MED ORDER — HEPARIN SOD (PORK) LOCK FLUSH 100 UNIT/ML IV SOLN
500.0000 [IU] | Freq: Once | INTRAVENOUS | Status: AC | PRN
  Administered 2018-06-12: 500 [IU]
  Filled 2018-06-12: qty 5

## 2018-06-12 MED FILL — NORMAL SALINE FLUSH SYRINGE: 0.9 | 30 days supply | Qty: 300 | Fill #0

## 2018-06-12 NOTE — Telephone Encounter (Signed)
Scheduled appt per 6/5 los/sch message - called pt - unable to reach.left message with appt date and time

## 2018-06-13 ENCOUNTER — Encounter: Payer: Self-pay | Admitting: Hematology

## 2018-06-13 LAB — CANCER ANTIGEN 19-9: CA 19-9: 62 U/mL — ABNORMAL HIGH (ref 0–35)

## 2018-06-15 ENCOUNTER — Inpatient Hospital Stay: Payer: Medicare Other

## 2018-06-15 ENCOUNTER — Encounter (HOSPITAL_COMMUNITY): Payer: Self-pay | Admitting: Radiology

## 2018-06-15 ENCOUNTER — Telehealth: Payer: Self-pay

## 2018-06-15 ENCOUNTER — Telehealth: Payer: Self-pay | Admitting: Hematology

## 2018-06-15 ENCOUNTER — Ambulatory Visit (HOSPITAL_COMMUNITY)
Admission: RE | Admit: 2018-06-15 | Discharge: 2018-06-15 | Disposition: A | Discharge status: Home / Self Care | Payer: Medicare Other | Source: Ambulatory Visit | Attending: Hematology | Admitting: Hematology

## 2018-06-15 ENCOUNTER — Other Ambulatory Visit: Payer: Self-pay

## 2018-06-15 ENCOUNTER — Other Ambulatory Visit: Payer: Self-pay | Admitting: Hematology

## 2018-06-15 VITALS — BP 111/71 | HR 101 | Temp 98.0°F | Resp 17

## 2018-06-15 DIAGNOSIS — R0989 Other specified symptoms and signs involving the circulatory and respiratory systems: Secondary | ICD-10-CM | POA: Diagnosis not present

## 2018-06-15 DIAGNOSIS — M545 Low back pain: Secondary | ICD-10-CM | POA: Diagnosis not present

## 2018-06-15 DIAGNOSIS — E860 Dehydration: Secondary | ICD-10-CM | POA: Diagnosis not present

## 2018-06-15 DIAGNOSIS — R180 Malignant ascites: Secondary | ICD-10-CM | POA: Diagnosis not present

## 2018-06-15 DIAGNOSIS — K831 Obstruction of bile duct: Secondary | ICD-10-CM | POA: Diagnosis not present

## 2018-06-15 DIAGNOSIS — C221 Intrahepatic bile duct carcinoma: Secondary | ICD-10-CM | POA: Diagnosis not present

## 2018-06-15 DIAGNOSIS — D6481 Anemia due to antineoplastic chemotherapy: Secondary | ICD-10-CM | POA: Diagnosis not present

## 2018-06-15 DIAGNOSIS — E1142 Type 2 diabetes mellitus with diabetic polyneuropathy: Secondary | ICD-10-CM | POA: Diagnosis not present

## 2018-06-15 DIAGNOSIS — C787 Secondary malignant neoplasm of liver and intrahepatic bile duct: Secondary | ICD-10-CM

## 2018-06-15 DIAGNOSIS — I10 Essential (primary) hypertension: Secondary | ICD-10-CM | POA: Diagnosis not present

## 2018-06-15 DIAGNOSIS — Z95828 Presence of other vascular implants and grafts: Secondary | ICD-10-CM

## 2018-06-15 DIAGNOSIS — M1990 Unspecified osteoarthritis, unspecified site: Secondary | ICD-10-CM | POA: Diagnosis not present

## 2018-06-15 DIAGNOSIS — K766 Portal hypertension: Secondary | ICD-10-CM | POA: Diagnosis not present

## 2018-06-15 DIAGNOSIS — I81 Portal vein thrombosis: Secondary | ICD-10-CM | POA: Diagnosis not present

## 2018-06-15 DIAGNOSIS — C772 Secondary and unspecified malignant neoplasm of intra-abdominal lymph nodes: Secondary | ICD-10-CM | POA: Diagnosis not present

## 2018-06-15 DIAGNOSIS — I8510 Secondary esophageal varices without bleeding: Secondary | ICD-10-CM | POA: Diagnosis not present

## 2018-06-15 DIAGNOSIS — Z885 Allergy status to narcotic agent status: Secondary | ICD-10-CM | POA: Diagnosis not present

## 2018-06-15 DIAGNOSIS — Z79899 Other long term (current) drug therapy: Secondary | ICD-10-CM | POA: Diagnosis not present

## 2018-06-15 DIAGNOSIS — C7972 Secondary malignant neoplasm of left adrenal gland: Secondary | ICD-10-CM | POA: Diagnosis not present

## 2018-06-15 DIAGNOSIS — R188 Other ascites: Secondary | ICD-10-CM | POA: Diagnosis not present

## 2018-06-15 DIAGNOSIS — K3189 Other diseases of stomach and duodenum: Secondary | ICD-10-CM | POA: Diagnosis not present

## 2018-06-15 DIAGNOSIS — K920 Hematemesis: Secondary | ICD-10-CM | POA: Diagnosis not present

## 2018-06-15 DIAGNOSIS — T451X5A Adverse effect of antineoplastic and immunosuppressive drugs, initial encounter: Secondary | ICD-10-CM | POA: Diagnosis not present

## 2018-06-15 DIAGNOSIS — C7971 Secondary malignant neoplasm of right adrenal gland: Secondary | ICD-10-CM | POA: Diagnosis not present

## 2018-06-15 DIAGNOSIS — I700 Atherosclerosis of aorta: Secondary | ICD-10-CM | POA: Diagnosis not present

## 2018-06-15 DIAGNOSIS — G8929 Other chronic pain: Secondary | ICD-10-CM | POA: Diagnosis not present

## 2018-06-15 HISTORY — PX: IR PARACENTESIS: IMG2679

## 2018-06-15 MED ORDER — LIDOCAINE HCL 1 % IJ SOLN
INTRAMUSCULAR | Status: AC
  Filled 2018-06-15: qty 20

## 2018-06-15 MED ORDER — ALBUMIN HUMAN 25 % IV SOLN
25.0000 g | Freq: Once | INTRAVENOUS | Status: AC
  Administered 2018-06-15: 25 g via INTRAVENOUS
  Filled 2018-06-15: qty 100

## 2018-06-15 MED ORDER — SODIUM CHLORIDE 0.9% FLUSH
10.0000 mL | INTRAVENOUS | Status: AC | PRN
  Administered 2018-06-15: 10 mL
  Filled 2018-06-15: qty 10

## 2018-06-15 MED ORDER — SODIUM CHLORIDE 0.9 % IV SOLN
INTRAVENOUS | Status: AC
  Administered 2018-06-15: 15:00:00 via INTRAVENOUS
  Filled 2018-06-15 (×2): qty 250

## 2018-06-15 MED ORDER — HEPARIN SOD (PORK) LOCK FLUSH 100 UNIT/ML IV SOLN
500.0000 [IU] | Freq: Once | INTRAVENOUS | Status: AC | PRN
  Administered 2018-06-15: 500 [IU]
  Filled 2018-06-15: qty 5

## 2018-06-15 NOTE — Telephone Encounter (Signed)
Per 6/5 los appts already scheduled.

## 2018-06-15 NOTE — Telephone Encounter (Signed)
VM left for patient's wife offering to schedule visit with Palliative Care.

## 2018-06-15 NOTE — Telephone Encounter (Signed)
VM left for patient to offer to schedule visit with Palliative Care

## 2018-06-15 NOTE — Procedures (Signed)
Ultrasound-guided therapeutic paracentesis performed yielding 4.7 liters of yellow  fluid. No immediate complications. EBL< 1 cc.

## 2018-06-15 NOTE — Patient Instructions (Signed)
Albumin injection °What is this medicine? °ALBUMIN (al BYOO min) is used to treat or prevent shock following serious injury, bleeding, surgery, or burns by increasing the volume of blood plasma. This medicine can also replace low blood protein. °This medicine may be used for other purposes; ask your health care provider or pharmacist if you have questions. °COMMON BRAND NAME(S): Albuked, Albumarc, Albuminar, Albuminex, AlbuRx, Albutein, Buminate, Flexbumin, Kedbumin, Macrotec, Plasbumin °What should I tell my health care provider before I take this medicine? °They need to know if you have any of the following conditions: °-anemia °-heart disease °-kidney disease °-an unusual or allergic reaction to albumin, other medicines, foods, dyes, or preservatives °-pregnant or trying to get pregnant °-breast-feeding °How should I use this medicine? °This medicine is for infusion into a vein. It is given by a health-care professional in a hospital or clinic. °Talk to your pediatrician regarding the use of this medicine in children. While this drug may be prescribed for selected conditions, precautions do apply. °Overdosage: If you think you have taken too much of this medicine contact a poison control center or emergency room at once. °NOTE: This medicine is only for you. Do not share this medicine with others. °What if I miss a dose? °This does not apply. °What may interact with this medicine? °Interactions are not expected. °This list may not describe all possible interactions. Give your health care provider a list of all the medicines, herbs, non-prescription drugs, or dietary supplements you use. Also tell them if you smoke, drink alcohol, or use illegal drugs. Some items may interact with your medicine. °What should I watch for while using this medicine? °Your condition will be closely monitored while you receive this medicine. °Some products are derived from human plasma, and there is a small risk that these products may  contain certain types of virus or bacteria. All products are processed to kill most viruses and bacteria. If you have questions concerning the risk of infections, discuss them with your doctor or health care professional. °What side effects may I notice from receiving this medicine? °Side effects that you should report to your doctor or health care professional as soon as possible: °-allergic reactions like skin rash, itching or hives, swelling of the face, lips, or tongue °-breathing problems °-changes in heartbeat °-fever, chills °-pain, redness or swelling at the injection site °-signs of viral infection including fever, drowsiness, chills, runny nose followed in about 2 weeks by a rash and joint pain °-tightness in the chest °Side effects that usually do not require medical attention (report to your doctor or health care professional if they continue or are bothersome): °-increased salivation °-nausea, vomiting °This list may not describe all possible side effects. Call your doctor for medical advice about side effects. You may report side effects to FDA at 1-800-FDA-1088. °Where should I keep my medicine? °This does not apply. You will not be given this medicine to store at home. °NOTE: This sheet is a summary. It may not cover all possible information. If you have questions about this medicine, talk to your doctor, pharmacist, or health care provider. °© 2019 [employer] (2007-03-19 10:18:55) ° °

## 2018-06-17 DIAGNOSIS — C787 Secondary malignant neoplasm of liver and intrahepatic bile duct: Secondary | ICD-10-CM | POA: Diagnosis not present

## 2018-06-18 ENCOUNTER — Telehealth: Payer: Self-pay | Admitting: Internal Medicine

## 2018-06-18 NOTE — Telephone Encounter (Signed)
Attempted to schedule Palliative Consult with patient's wife Rollene Fare, no answer.  Message left with reason for call along with my name and call back number

## 2018-06-18 NOTE — Telephone Encounter (Signed)
Called patient to schedule Palliative Consult, no answer.  Message left with reason for call along with my name and contact number.

## 2018-06-18 NOTE — Telephone Encounter (Signed)
Called wife's cell number to attempt to schedule Palliative Consult, no answer.  Left message with reason for call along with my name and contact number

## 2018-06-19 ENCOUNTER — Inpatient Hospital Stay (HOSPITAL_COMMUNITY): Payer: Medicare Other

## 2018-06-19 ENCOUNTER — Encounter (HOSPITAL_COMMUNITY)
Admission: EM | Disposition: A | Discharge status: Hospice | Payer: Self-pay | Source: Home / Self Care | Attending: Pulmonary Disease

## 2018-06-19 ENCOUNTER — Encounter (HOSPITAL_COMMUNITY): Payer: Self-pay | Admitting: Emergency Medicine

## 2018-06-19 ENCOUNTER — Emergency Department (HOSPITAL_COMMUNITY): Payer: Medicare Other

## 2018-06-19 ENCOUNTER — Inpatient Hospital Stay (HOSPITAL_COMMUNITY)
Admission: EM | Admit: 2018-06-19 | Discharge: 2018-06-26 | DRG: 871 | Disposition: A | Discharge status: Hospice | Payer: Medicare Other | Attending: Internal Medicine | Admitting: Internal Medicine

## 2018-06-19 ENCOUNTER — Other Ambulatory Visit: Payer: Self-pay

## 2018-06-19 DIAGNOSIS — Z9221 Personal history of antineoplastic chemotherapy: Secondary | ICD-10-CM | POA: Diagnosis not present

## 2018-06-19 DIAGNOSIS — R509 Fever, unspecified: Secondary | ICD-10-CM

## 2018-06-19 DIAGNOSIS — K7031 Alcoholic cirrhosis of liver with ascites: Secondary | ICD-10-CM | POA: Diagnosis present

## 2018-06-19 DIAGNOSIS — Z79899 Other long term (current) drug therapy: Secondary | ICD-10-CM

## 2018-06-19 DIAGNOSIS — J9601 Acute respiratory failure with hypoxia: Secondary | ICD-10-CM | POA: Diagnosis not present

## 2018-06-19 DIAGNOSIS — Z6831 Body mass index (BMI) 31.0-31.9, adult: Secondary | ICD-10-CM

## 2018-06-19 DIAGNOSIS — R180 Malignant ascites: Secondary | ICD-10-CM

## 2018-06-19 DIAGNOSIS — C787 Secondary malignant neoplasm of liver and intrahepatic bile duct: Secondary | ICD-10-CM | POA: Diagnosis present

## 2018-06-19 DIAGNOSIS — Z66 Do not resuscitate: Secondary | ICD-10-CM | POA: Diagnosis present

## 2018-06-19 DIAGNOSIS — W1839XA Other fall on same level, initial encounter: Secondary | ICD-10-CM | POA: Diagnosis present

## 2018-06-19 DIAGNOSIS — I8511 Secondary esophageal varices with bleeding: Secondary | ICD-10-CM

## 2018-06-19 DIAGNOSIS — S299XXA Unspecified injury of thorax, initial encounter: Secondary | ICD-10-CM | POA: Diagnosis not present

## 2018-06-19 DIAGNOSIS — R933 Abnormal findings on diagnostic imaging of other parts of digestive tract: Secondary | ICD-10-CM | POA: Diagnosis not present

## 2018-06-19 DIAGNOSIS — E871 Hypo-osmolality and hyponatremia: Secondary | ICD-10-CM | POA: Diagnosis not present

## 2018-06-19 DIAGNOSIS — K264 Chronic or unspecified duodenal ulcer with hemorrhage: Secondary | ICD-10-CM | POA: Diagnosis not present

## 2018-06-19 DIAGNOSIS — K3189 Other diseases of stomach and duodenum: Secondary | ICD-10-CM | POA: Diagnosis not present

## 2018-06-19 DIAGNOSIS — Z7189 Other specified counseling: Secondary | ICD-10-CM | POA: Diagnosis not present

## 2018-06-19 DIAGNOSIS — Z87891 Personal history of nicotine dependence: Secondary | ICD-10-CM

## 2018-06-19 DIAGNOSIS — Y92009 Unspecified place in unspecified non-institutional (private) residence as the place of occurrence of the external cause: Secondary | ICD-10-CM | POA: Diagnosis not present

## 2018-06-19 DIAGNOSIS — I119 Hypertensive heart disease without heart failure: Secondary | ICD-10-CM | POA: Diagnosis present

## 2018-06-19 DIAGNOSIS — E872 Acidosis: Secondary | ICD-10-CM | POA: Diagnosis present

## 2018-06-19 DIAGNOSIS — I2510 Atherosclerotic heart disease of native coronary artery without angina pectoris: Secondary | ICD-10-CM | POA: Diagnosis present

## 2018-06-19 DIAGNOSIS — S00511A Abrasion of lip, initial encounter: Secondary | ICD-10-CM | POA: Diagnosis present

## 2018-06-19 DIAGNOSIS — R531 Weakness: Secondary | ICD-10-CM | POA: Diagnosis not present

## 2018-06-19 DIAGNOSIS — Z885 Allergy status to narcotic agent status: Secondary | ICD-10-CM

## 2018-06-19 DIAGNOSIS — Z978 Presence of other specified devices: Secondary | ICD-10-CM

## 2018-06-19 DIAGNOSIS — D62 Acute posthemorrhagic anemia: Secondary | ICD-10-CM | POA: Diagnosis present

## 2018-06-19 DIAGNOSIS — Z515 Encounter for palliative care: Secondary | ICD-10-CM | POA: Diagnosis not present

## 2018-06-19 DIAGNOSIS — K7460 Unspecified cirrhosis of liver: Secondary | ICD-10-CM

## 2018-06-19 DIAGNOSIS — K831 Obstruction of bile duct: Secondary | ICD-10-CM | POA: Diagnosis not present

## 2018-06-19 DIAGNOSIS — Z9181 History of falling: Secondary | ICD-10-CM

## 2018-06-19 DIAGNOSIS — Z9861 Coronary angioplasty status: Secondary | ICD-10-CM

## 2018-06-19 DIAGNOSIS — F1911 Other psychoactive substance abuse, in remission: Secondary | ICD-10-CM | POA: Diagnosis present

## 2018-06-19 DIAGNOSIS — Y9301 Activity, walking, marching and hiking: Secondary | ICD-10-CM | POA: Diagnosis present

## 2018-06-19 DIAGNOSIS — Z794 Long term (current) use of insulin: Secondary | ICD-10-CM | POA: Diagnosis not present

## 2018-06-19 DIAGNOSIS — R220 Localized swelling, mass and lump, head: Secondary | ICD-10-CM | POA: Diagnosis not present

## 2018-06-19 DIAGNOSIS — D630 Anemia in neoplastic disease: Secondary | ICD-10-CM | POA: Diagnosis present

## 2018-06-19 DIAGNOSIS — E119 Type 2 diabetes mellitus without complications: Secondary | ICD-10-CM | POA: Diagnosis not present

## 2018-06-19 DIAGNOSIS — F1011 Alcohol abuse, in remission: Secondary | ICD-10-CM | POA: Diagnosis present

## 2018-06-19 DIAGNOSIS — C221 Intrahepatic bile duct carcinoma: Secondary | ICD-10-CM | POA: Diagnosis present

## 2018-06-19 DIAGNOSIS — S199XXA Unspecified injury of neck, initial encounter: Secondary | ICD-10-CM | POA: Diagnosis not present

## 2018-06-19 DIAGNOSIS — E11649 Type 2 diabetes mellitus with hypoglycemia without coma: Secondary | ICD-10-CM | POA: Diagnosis not present

## 2018-06-19 DIAGNOSIS — K838 Other specified diseases of biliary tract: Secondary | ICD-10-CM | POA: Diagnosis not present

## 2018-06-19 DIAGNOSIS — K921 Melena: Secondary | ICD-10-CM | POA: Diagnosis not present

## 2018-06-19 DIAGNOSIS — Z8619 Personal history of other infectious and parasitic diseases: Secondary | ICD-10-CM

## 2018-06-19 DIAGNOSIS — E1142 Type 2 diabetes mellitus with diabetic polyneuropathy: Secondary | ICD-10-CM | POA: Diagnosis not present

## 2018-06-19 DIAGNOSIS — Z791 Long term (current) use of non-steroidal anti-inflammatories (NSAID): Secondary | ICD-10-CM

## 2018-06-19 DIAGNOSIS — K228 Other specified diseases of esophagus: Secondary | ICD-10-CM | POA: Diagnosis not present

## 2018-06-19 DIAGNOSIS — C7972 Secondary malignant neoplasm of left adrenal gland: Secondary | ICD-10-CM | POA: Diagnosis not present

## 2018-06-19 DIAGNOSIS — S0081XA Abrasion of other part of head, initial encounter: Secondary | ICD-10-CM | POA: Diagnosis present

## 2018-06-19 DIAGNOSIS — R6510 Systemic inflammatory response syndrome (SIRS) of non-infectious origin without acute organ dysfunction: Secondary | ICD-10-CM | POA: Diagnosis present

## 2018-06-19 DIAGNOSIS — C7971 Secondary malignant neoplasm of right adrenal gland: Secondary | ICD-10-CM | POA: Diagnosis present

## 2018-06-19 DIAGNOSIS — D500 Iron deficiency anemia secondary to blood loss (chronic): Secondary | ICD-10-CM

## 2018-06-19 DIAGNOSIS — F4310 Post-traumatic stress disorder, unspecified: Secondary | ICD-10-CM | POA: Diagnosis present

## 2018-06-19 DIAGNOSIS — S0990XA Unspecified injury of head, initial encounter: Secondary | ICD-10-CM | POA: Diagnosis not present

## 2018-06-19 DIAGNOSIS — S0993XA Unspecified injury of face, initial encounter: Secondary | ICD-10-CM | POA: Diagnosis not present

## 2018-06-19 DIAGNOSIS — Z9049 Acquired absence of other specified parts of digestive tract: Secondary | ICD-10-CM

## 2018-06-19 DIAGNOSIS — K766 Portal hypertension: Secondary | ICD-10-CM | POA: Diagnosis not present

## 2018-06-19 DIAGNOSIS — K2210 Ulcer of esophagus without bleeding: Secondary | ICD-10-CM | POA: Diagnosis present

## 2018-06-19 DIAGNOSIS — R000 Tachycardia, unspecified: Secondary | ICD-10-CM | POA: Diagnosis not present

## 2018-06-19 DIAGNOSIS — E669 Obesity, unspecified: Secondary | ICD-10-CM | POA: Diagnosis present

## 2018-06-19 DIAGNOSIS — R896 Abnormal cytological findings in specimens from other organs, systems and tissues: Secondary | ICD-10-CM | POA: Diagnosis not present

## 2018-06-19 DIAGNOSIS — Z85038 Personal history of other malignant neoplasm of large intestine: Secondary | ICD-10-CM

## 2018-06-19 DIAGNOSIS — Z20828 Contact with and (suspected) exposure to other viral communicable diseases: Secondary | ICD-10-CM | POA: Diagnosis present

## 2018-06-19 DIAGNOSIS — G8929 Other chronic pain: Secondary | ICD-10-CM | POA: Diagnosis present

## 2018-06-19 DIAGNOSIS — Z9689 Presence of other specified functional implants: Secondary | ICD-10-CM | POA: Diagnosis present

## 2018-06-19 DIAGNOSIS — E46 Unspecified protein-calorie malnutrition: Secondary | ICD-10-CM | POA: Diagnosis present

## 2018-06-19 DIAGNOSIS — R6521 Severe sepsis with septic shock: Secondary | ICD-10-CM | POA: Diagnosis not present

## 2018-06-19 DIAGNOSIS — C228 Malignant neoplasm of liver, primary, unspecified as to type: Secondary | ICD-10-CM | POA: Diagnosis not present

## 2018-06-19 DIAGNOSIS — Z7401 Bed confinement status: Secondary | ICD-10-CM | POA: Diagnosis not present

## 2018-06-19 DIAGNOSIS — I81 Portal vein thrombosis: Secondary | ICD-10-CM | POA: Diagnosis not present

## 2018-06-19 DIAGNOSIS — I959 Hypotension, unspecified: Secondary | ICD-10-CM | POA: Diagnosis not present

## 2018-06-19 DIAGNOSIS — A419 Sepsis, unspecified organism: Secondary | ICD-10-CM | POA: Diagnosis not present

## 2018-06-19 DIAGNOSIS — R188 Other ascites: Secondary | ICD-10-CM | POA: Diagnosis not present

## 2018-06-19 DIAGNOSIS — M545 Low back pain: Secondary | ICD-10-CM | POA: Diagnosis present

## 2018-06-19 DIAGNOSIS — W19XXXA Unspecified fall, initial encounter: Secondary | ICD-10-CM | POA: Diagnosis not present

## 2018-06-19 DIAGNOSIS — M2550 Pain in unspecified joint: Secondary | ICD-10-CM | POA: Diagnosis not present

## 2018-06-19 DIAGNOSIS — I4581 Long QT syndrome: Secondary | ICD-10-CM | POA: Diagnosis present

## 2018-06-19 DIAGNOSIS — R0689 Other abnormalities of breathing: Secondary | ICD-10-CM | POA: Diagnosis not present

## 2018-06-19 DIAGNOSIS — R918 Other nonspecific abnormal finding of lung field: Secondary | ICD-10-CM | POA: Diagnosis not present

## 2018-06-19 HISTORY — PX: ESOPHAGOGASTRODUODENOSCOPY: SHX5428

## 2018-06-19 LAB — URINALYSIS, ROUTINE W REFLEX MICROSCOPIC
Bilirubin Urine: NEGATIVE
Glucose, UA: NEGATIVE mg/dL
Hgb urine dipstick: NEGATIVE
Ketones, ur: NEGATIVE mg/dL
Leukocytes,Ua: NEGATIVE
Nitrite: NEGATIVE
Protein, ur: NEGATIVE mg/dL
Specific Gravity, Urine: 1.015 (ref 1.005–1.030)
pH: 5 (ref 5.0–8.0)

## 2018-06-19 LAB — CBC WITH DIFFERENTIAL/PLATELET
Abs Immature Granulocytes: 0.15 10*3/uL — ABNORMAL HIGH (ref 0.00–0.07)
Abs Immature Granulocytes: 0.07 10*3/uL (ref 0.00–0.07)
Basophils Absolute: 0 10*3/uL (ref 0.0–0.1)
Basophils Absolute: 0 10*3/uL (ref 0.0–0.1)
Basophils Relative: 0 %
Basophils Relative: 0 %
Eosinophils Absolute: 0.1 10*3/uL (ref 0.0–0.5)
Eosinophils Absolute: 0 10*3/uL (ref 0.0–0.5)
Eosinophils Relative: 1 %
Eosinophils Relative: 0 %
HCT: 25.4 % — ABNORMAL LOW (ref 39.0–52.0)
HCT: 19.1 % — ABNORMAL LOW (ref 39.0–52.0)
Hemoglobin: 8 g/dL — ABNORMAL LOW (ref 13.0–17.0)
Hemoglobin: 5.9 g/dL — CL (ref 13.0–17.0)
Immature Granulocytes: 1 %
Immature Granulocytes: 1 %
Lymphocytes Relative: 4 %
Lymphocytes Relative: 4 %
Lymphs Abs: 0.6 10*3/uL — ABNORMAL LOW (ref 0.7–4.0)
Lymphs Abs: 0.5 10*3/uL — ABNORMAL LOW (ref 0.7–4.0)
MCH: 28.2 pg (ref 26.0–34.0)
MCH: 28.2 pg (ref 26.0–34.0)
MCHC: 31.5 g/dL (ref 30.0–36.0)
MCHC: 30.9 g/dL (ref 30.0–36.0)
MCV: 89.4 fL (ref 80.0–100.0)
MCV: 91.4 fL (ref 80.0–100.0)
Monocytes Absolute: 0.9 10*3/uL (ref 0.1–1.0)
Monocytes Absolute: 1 10*3/uL (ref 0.1–1.0)
Monocytes Relative: 6 %
Monocytes Relative: 8 %
Neutro Abs: 14 10*3/uL — ABNORMAL HIGH (ref 1.7–7.7)
Neutro Abs: 10.2 10*3/uL — ABNORMAL HIGH (ref 1.7–7.7)
Neutrophils Relative %: 88 %
Neutrophils Relative %: 87 %
Platelets: 228 10*3/uL (ref 150–400)
Platelets: 157 10*3/uL (ref 150–400)
RBC: 2.84 MIL/uL — ABNORMAL LOW (ref 4.22–5.81)
RBC: 2.09 MIL/uL — ABNORMAL LOW (ref 4.22–5.81)
RDW: 16.7 % — ABNORMAL HIGH (ref 11.5–15.5)
RDW: 16.4 % — ABNORMAL HIGH (ref 11.5–15.5)
WBC: 15.7 10*3/uL — ABNORMAL HIGH (ref 4.0–10.5)
WBC: 11.8 10*3/uL — ABNORMAL HIGH (ref 4.0–10.5)
nRBC: 0 % (ref 0.0–0.2)
nRBC: 0 % (ref 0.0–0.2)

## 2018-06-19 LAB — COMPREHENSIVE METABOLIC PANEL
ALT: 22 U/L (ref 0–44)
ALT: 22 U/L (ref 0–44)
AST: 51 U/L — ABNORMAL HIGH (ref 15–41)
AST: 46 U/L — ABNORMAL HIGH (ref 15–41)
Albumin: 2.2 g/dL — ABNORMAL LOW (ref 3.5–5.0)
Albumin: 2.2 g/dL — ABNORMAL LOW (ref 3.5–5.0)
Alkaline Phosphatase: 345 U/L — ABNORMAL HIGH (ref 38–126)
Alkaline Phosphatase: 257 U/L — ABNORMAL HIGH (ref 38–126)
Anion gap: 11 (ref 5–15)
Anion gap: 4 — ABNORMAL LOW (ref 5–15)
BUN: 12 mg/dL (ref 8–23)
BUN: 13 mg/dL (ref 8–23)
CO2: 18 mmol/L — ABNORMAL LOW (ref 22–32)
CO2: 21 mmol/L — ABNORMAL LOW (ref 22–32)
Calcium: 7.6 mg/dL — ABNORMAL LOW (ref 8.9–10.3)
Calcium: 7.2 mg/dL — ABNORMAL LOW (ref 8.9–10.3)
Chloride: 98 mmol/L (ref 98–111)
Chloride: 105 mmol/L (ref 98–111)
Creatinine, Ser: 0.77 mg/dL (ref 0.61–1.24)
Creatinine, Ser: 0.76 mg/dL (ref 0.61–1.24)
GFR calc Af Amer: >60 mL/min (ref >60)
GFR calc Af Amer: >60 mL/min (ref >60)
GFR calc non Af Amer: >60 mL/min (ref >60)
GFR calc non Af Amer: >60 mL/min (ref >60)
Glucose, Bld: 161 mg/dL — ABNORMAL HIGH (ref 70–99)
Glucose, Bld: 145 mg/dL — ABNORMAL HIGH (ref 70–99)
Potassium: 4 mmol/L (ref 3.5–5.1)
Potassium: 4.2 mmol/L (ref 3.5–5.1)
Sodium: 127 mmol/L — ABNORMAL LOW (ref 135–145)
Sodium: 130 mmol/L — ABNORMAL LOW (ref 135–145)
Total Bilirubin: 2.9 mg/dL — ABNORMAL HIGH (ref 0.3–1.2)
Total Bilirubin: 3.2 mg/dL — ABNORMAL HIGH (ref 0.3–1.2)
Total Protein: 6.6 g/dL (ref 6.5–8.1)
Total Protein: 5.9 g/dL — ABNORMAL LOW (ref 6.5–8.1)

## 2018-06-19 LAB — BLOOD GAS, ARTERIAL
Acid-base deficit: 2.2 mmol/L — ABNORMAL HIGH (ref 0.0–2.0)
Bicarbonate: 21.1 mmol/L (ref 20.0–28.0)
Drawn by: 514251
FIO2: 60
MECHVT: 600 mL
O2 Saturation: 99.7 %
PEEP: 5 cmH2O
Patient temperature: 97.6
RATE: 16 resp/min
pCO2 arterial: 32.2 mmHg (ref 32.0–48.0)
pH, Arterial: 7.43 (ref 7.350–7.450)
pO2, Arterial: 252 mmHg — ABNORMAL HIGH (ref 83.0–108.0)

## 2018-06-19 LAB — APTT: aPTT: 44 seconds — ABNORMAL HIGH (ref 24–36)

## 2018-06-19 LAB — LACTIC ACID, PLASMA
Lactic Acid, Venous: 3 mmol/L (ref 0.5–1.9)
Lactic Acid, Venous: 1.3 mmol/L (ref 0.5–1.9)

## 2018-06-19 LAB — GLUCOSE, PLEURAL OR PERITONEAL FLUID: Glucose, Fluid: 156 mg/dL

## 2018-06-19 LAB — GLUCOSE, CAPILLARY
Glucose-Capillary: 140 mg/dL — ABNORMAL HIGH (ref 70–99)
Glucose-Capillary: 134 mg/dL — ABNORMAL HIGH (ref 70–99)
Glucose-Capillary: 81 mg/dL (ref 70–99)
Glucose-Capillary: 45 mg/dL — ABNORMAL LOW (ref 70–99)
Glucose-Capillary: 125 mg/dL — ABNORMAL HIGH (ref 70–99)

## 2018-06-19 LAB — MRSA PCR SCREENING: MRSA by PCR: NEGATIVE

## 2018-06-19 LAB — PROTIME-INR
INR: 1.5 — ABNORMAL HIGH (ref 0.8–1.2)
INR: 1.7 — ABNORMAL HIGH (ref 0.8–1.2)
Prothrombin Time: 17.7 seconds — ABNORMAL HIGH (ref 11.4–15.2)
Prothrombin Time: 19.4 seconds — ABNORMAL HIGH (ref 11.4–15.2)

## 2018-06-19 LAB — PREPARE RBC (CROSSMATCH)

## 2018-06-19 LAB — BODY FLUID CELL COUNT WITH DIFFERENTIAL
Lymphs, Fluid: 26 %
Monocyte-Macrophage-Serous Fluid: 62 % (ref 50–90)
Neutrophil Count, Fluid: 12 % (ref 0–25)
Total Nucleated Cell Count, Fluid: 166 cu mm (ref 0–1000)

## 2018-06-19 LAB — PROCALCITONIN: Procalcitonin: 4.79 ng/mL

## 2018-06-19 LAB — PROTEIN, PLEURAL OR PERITONEAL FLUID: Total protein, fluid: <3 g/dL

## 2018-06-19 LAB — SARS CORONAVIRUS 2 BY RT PCR (HOSPITAL ORDER, PERFORMED IN ~~LOC~~ HOSPITAL LAB): SARS Coronavirus 2: NEGATIVE

## 2018-06-19 SURGERY — EGD (ESOPHAGOGASTRODUODENOSCOPY)
Anesthesia: Moderate Sedation

## 2018-06-19 MED ORDER — MIDAZOLAM HCL 2 MG/2ML IJ SOLN
INTRAMUSCULAR | Status: AC
  Filled 2018-06-19: qty 2

## 2018-06-19 MED ORDER — PANTOPRAZOLE SODIUM 40 MG PO TBEC
40.0000 mg | DELAYED_RELEASE_TABLET | Freq: Two times a day (BID) | ORAL | Status: DC
  Administered 2018-06-19: 40 mg via ORAL
  Filled 2018-06-19: qty 1

## 2018-06-19 MED ORDER — MIDAZOLAM HCL 2 MG/2ML IJ SOLN
2.0000 mg | Freq: Once | INTRAMUSCULAR | Status: AC
  Administered 2018-06-19: 2 mg via INTRAVENOUS

## 2018-06-19 MED ORDER — INSULIN DETEMIR 100 UNIT/ML ~~LOC~~ SOLN
42.0000 [IU] | Freq: Every day | SUBCUTANEOUS | Status: DC
  Administered 2018-06-19: 42 [IU] via SUBCUTANEOUS
  Filled 2018-06-19 (×3): qty 0.42

## 2018-06-19 MED ORDER — METRONIDAZOLE IN NACL 5-0.79 MG/ML-% IV SOLN
500.0000 mg | Freq: Once | INTRAVENOUS | Status: AC
  Administered 2018-06-19: 500 mg via INTRAVENOUS
  Filled 2018-06-19: qty 100

## 2018-06-19 MED ORDER — SODIUM CHLORIDE 0.9 % IV BOLUS
1000.0000 mL | Freq: Once | INTRAVENOUS | Status: AC
  Administered 2018-06-19: 1000 mL via INTRAVENOUS

## 2018-06-19 MED ORDER — PHENYLEPHRINE HCL-NACL 10-0.9 MG/250ML-% IV SOLN
0.0000 ug/min | INTRAVENOUS | Status: DC
  Administered 2018-06-19: 90 ug/min via INTRAVENOUS
  Administered 2018-06-19: 20 ug/min via INTRAVENOUS
  Administered 2018-06-19: 17:00:00 100 ug/min via INTRAVENOUS
  Administered 2018-06-19: 120 ug/min via INTRAVENOUS
  Administered 2018-06-19: 80 ug/min via INTRAVENOUS
  Administered 2018-06-20: 23:00:00 45 ug/min via INTRAVENOUS
  Administered 2018-06-20: 90 ug/min via INTRAVENOUS
  Administered 2018-06-20: 11:00:00 45 ug/min via INTRAVENOUS
  Administered 2018-06-20: 80 ug/min via INTRAVENOUS
  Administered 2018-06-20: 50 ug/min via INTRAVENOUS
  Administered 2018-06-20: 05:00:00 100 ug/min via INTRAVENOUS
  Administered 2018-06-20: 19:00:00 48 ug/min via INTRAVENOUS
  Administered 2018-06-20: 100 ug/min via INTRAVENOUS
  Administered 2018-06-21: 40 ug/min via INTRAVENOUS
  Administered 2018-06-21 (×2): 50 ug/min via INTRAVENOUS
  Administered 2018-06-21 (×3): 45 ug/min via INTRAVENOUS
  Administered 2018-06-22: 50 ug/min via INTRAVENOUS
  Administered 2018-06-22 (×2): 45 ug/min via INTRAVENOUS
  Administered 2018-06-22 (×2): 50 ug/min via INTRAVENOUS
  Administered 2018-06-23 (×2): 45 ug/min via INTRAVENOUS
  Filled 2018-06-19 (×7): qty 250
  Filled 2018-06-19 (×2): qty 500
  Filled 2018-06-19 (×12): qty 250
  Filled 2018-06-19: qty 500
  Filled 2018-06-19 (×6): qty 250

## 2018-06-19 MED ORDER — SODIUM CHLORIDE 0.9 % IV SOLN
8.0000 mg/h | INTRAVENOUS | Status: DC
  Administered 2018-06-19 – 2018-06-21 (×5): 8 mg/h via INTRAVENOUS
  Filled 2018-06-19 (×5): qty 80

## 2018-06-19 MED ORDER — METRONIDAZOLE IN NACL 5-0.79 MG/ML-% IV SOLN
500.0000 mg | Freq: Three times a day (TID) | INTRAVENOUS | Status: DC
  Administered 2018-06-19 – 2018-06-20 (×2): 500 mg via INTRAVENOUS
  Filled 2018-06-19 (×2): qty 100

## 2018-06-19 MED ORDER — INSULIN ASPART 100 UNIT/ML ~~LOC~~ SOLN
0.0000 [IU] | Freq: Three times a day (TID) | SUBCUTANEOUS | Status: DC
  Administered 2018-06-19 (×2): 1 [IU] via SUBCUTANEOUS
  Administered 2018-06-21 (×2): 2 [IU] via SUBCUTANEOUS
  Administered 2018-06-22 – 2018-06-23 (×2): 1 [IU] via SUBCUTANEOUS
  Administered 2018-06-23 – 2018-06-24 (×2): 2 [IU] via SUBCUTANEOUS
  Administered 2018-06-24 (×2): 1 [IU] via SUBCUTANEOUS
  Administered 2018-06-26: 18:00:00 2 [IU] via SUBCUTANEOUS

## 2018-06-19 MED ORDER — FENTANYL CITRATE (PF) 100 MCG/2ML IJ SOLN
INTRAMUSCULAR | Status: AC
  Filled 2018-06-19: qty 4

## 2018-06-19 MED ORDER — IBUPROFEN 200 MG PO TABS
400.0000 mg | ORAL_TABLET | Freq: Once | ORAL | Status: AC
  Administered 2018-06-19: 400 mg via ORAL
  Filled 2018-06-19: qty 2

## 2018-06-19 MED ORDER — MIDAZOLAM HCL (PF) 5 MG/ML IJ SOLN
INTRAMUSCULAR | Status: AC
  Filled 2018-06-19: qty 3

## 2018-06-19 MED ORDER — DEXTROSE 50 % IV SOLN
25.0000 g | INTRAVENOUS | Status: AC
  Administered 2018-06-19: 23:00:00 25 g via INTRAVENOUS

## 2018-06-19 MED ORDER — SODIUM CHLORIDE 0.9 % IV SOLN
2.0000 g | Freq: Once | INTRAVENOUS | Status: AC
  Administered 2018-06-19: 2 g via INTRAVENOUS
  Filled 2018-06-19: qty 2

## 2018-06-19 MED ORDER — INSULIN DETEMIR 100 UNIT/ML ~~LOC~~ SOLN
10.0000 [IU] | Freq: Every day | SUBCUTANEOUS | Status: DC
  Administered 2018-06-19 – 2018-06-26 (×7): 10 [IU] via SUBCUTANEOUS
  Filled 2018-06-19 (×8): qty 0.1

## 2018-06-19 MED ORDER — ORAL CARE MOUTH RINSE
15.0000 mL | Freq: Two times a day (BID) | OROMUCOSAL | Status: DC
  Administered 2018-06-19 – 2018-06-26 (×16): 15 mL via OROMUCOSAL

## 2018-06-19 MED ORDER — ROCURONIUM BROMIDE 50 MG/5ML IV SOLN
20.0000 mg | Freq: Once | INTRAVENOUS | Status: AC
  Administered 2018-06-19: 20 mg via INTRAVENOUS
  Filled 2018-06-19: qty 2

## 2018-06-19 MED ORDER — ACETAMINOPHEN 650 MG RE SUPP: 650.0000 mg | Freq: Four times a day (QID) | RECTAL | Status: DC | PRN

## 2018-06-19 MED ORDER — SODIUM CHLORIDE 0.9 % IV BOLUS
2000.0000 mL | Freq: Once | INTRAVENOUS | Status: AC
  Administered 2018-06-19: 2000 mL via INTRAVENOUS

## 2018-06-19 MED ORDER — PANTOPRAZOLE SODIUM 40 MG PO TBEC: 40.0000 mg | DELAYED_RELEASE_TABLET | Freq: Every day | ORAL | Status: DC

## 2018-06-19 MED ORDER — GABAPENTIN 400 MG PO CAPS
400.0000 mg | ORAL_CAPSULE | Freq: Every day | ORAL | Status: DC
  Administered 2018-06-19 – 2018-06-26 (×8): 400 mg via ORAL
  Filled 2018-06-19 (×8): qty 1

## 2018-06-19 MED ORDER — LIDOCAINE 5 % EX PTCH
1.0000 | MEDICATED_PATCH | Freq: Every day | CUTANEOUS | Status: DC | PRN
  Filled 2018-06-19: qty 1

## 2018-06-19 MED ORDER — ONDANSETRON HCL 4 MG/2ML IJ SOLN
4.0000 mg | Freq: Once | INTRAMUSCULAR | Status: AC
  Administered 2018-06-19: 04:00:00 4 mg via INTRAVENOUS
  Filled 2018-06-19: qty 2

## 2018-06-19 MED ORDER — FENTANYL CITRATE (PF) 100 MCG/2ML IJ SOLN
100.0000 ug | Freq: Once | INTRAMUSCULAR | Status: AC
  Administered 2018-06-19: 100 ug via INTRAVENOUS

## 2018-06-19 MED ORDER — SODIUM CHLORIDE 0.9 % IV SOLN
INTRAVENOUS | Status: AC
  Administered 2018-06-19 (×2): via INTRAVENOUS
  Administered 2018-06-19: 900 mL via INTRAVENOUS

## 2018-06-19 MED ORDER — FENTANYL CITRATE (PF) 100 MCG/2ML IJ SOLN
INTRAMUSCULAR | Status: AC
  Filled 2018-06-19: qty 2

## 2018-06-19 MED ORDER — VANCOMYCIN HCL IN DEXTROSE 1-5 GM/200ML-% IV SOLN
1000.0000 mg | Freq: Once | INTRAVENOUS | Status: DC
  Filled 2018-06-19: qty 200

## 2018-06-19 MED ORDER — ONDANSETRON HCL 4 MG PO TABS: 4.0000 mg | ORAL_TABLET | Freq: Four times a day (QID) | ORAL | Status: DC | PRN

## 2018-06-19 MED ORDER — ETOMIDATE 2 MG/ML IV SOLN
20.0000 mg | Freq: Once | INTRAVENOUS | Status: AC
  Administered 2018-06-19: 20 mg via INTRAVENOUS

## 2018-06-19 MED ORDER — DULOXETINE HCL 30 MG PO CPEP
30.0000 mg | ORAL_CAPSULE | Freq: Two times a day (BID) | ORAL | Status: DC
  Administered 2018-06-19 – 2018-06-26 (×15): 30 mg via ORAL
  Filled 2018-06-19 (×15): qty 1

## 2018-06-19 MED ORDER — SODIUM CHLORIDE 0.9% IV SOLUTION
Freq: Once | INTRAVENOUS | Status: AC
  Administered 2018-06-19: 17:00:00 via INTRAVENOUS

## 2018-06-19 MED ORDER — DEXTROSE 50 % IV SOLN
INTRAVENOUS | Status: AC
  Filled 2018-06-19: qty 50

## 2018-06-19 MED ORDER — DIPHENHYDRAMINE HCL 50 MG/ML IJ SOLN
INTRAMUSCULAR | Status: AC
  Filled 2018-06-19: qty 1

## 2018-06-19 MED ORDER — SODIUM CHLORIDE (PF) 0.9 % IJ SOLN
INTRAMUSCULAR | Status: AC
  Filled 2018-06-19: qty 50

## 2018-06-19 MED ORDER — MORPHINE SULFATE (PF) 4 MG/ML IV SOLN
4.0000 mg | Freq: Once | INTRAVENOUS | Status: AC
  Administered 2018-06-19: 4 mg via INTRAVENOUS
  Filled 2018-06-19: qty 1

## 2018-06-19 MED ORDER — PANTOPRAZOLE SODIUM 40 MG IV SOLR: 40.0000 mg | Freq: Two times a day (BID) | INTRAVENOUS | Status: DC

## 2018-06-19 MED ORDER — IOHEXOL 300 MG/ML  SOLN: 15.0000 mL | Freq: Once | INTRAMUSCULAR | Status: DC | PRN

## 2018-06-19 MED ORDER — VANCOMYCIN HCL 10 G IV SOLR
2000.0000 mg | Freq: Once | INTRAVENOUS | Status: AC
  Administered 2018-06-19: 2000 mg via INTRAVENOUS
  Filled 2018-06-19: qty 2000

## 2018-06-19 MED ORDER — EPINEPHRINE 1 MG/10ML IJ SOSY
PREFILLED_SYRINGE | INTRAMUSCULAR | Status: AC
  Filled 2018-06-19: qty 20

## 2018-06-19 MED ORDER — SPOT INK MARKER SYRINGE KIT
PACK | SUBMUCOSAL | Status: AC
  Filled 2018-06-19: qty 5

## 2018-06-19 MED ORDER — ALBUMIN HUMAN 25 % IV SOLN
25.0000 g | Freq: Four times a day (QID) | INTRAVENOUS | Status: DC
  Administered 2018-06-19 – 2018-06-21 (×8): 25 g via INTRAVENOUS
  Filled 2018-06-19: qty 50
  Filled 2018-06-19 (×2): qty 100
  Filled 2018-06-19: qty 50
  Filled 2018-06-19 (×3): qty 100
  Filled 2018-06-19: qty 50
  Filled 2018-06-19: qty 100
  Filled 2018-06-19: qty 50

## 2018-06-19 MED ORDER — ACETAMINOPHEN 325 MG PO TABS
650.0000 mg | ORAL_TABLET | Freq: Four times a day (QID) | ORAL | Status: DC | PRN
  Administered 2018-06-20 – 2018-06-26 (×4): 650 mg via ORAL
  Filled 2018-06-19 (×5): qty 2

## 2018-06-19 MED ORDER — MIDAZOLAM HCL 2 MG/2ML IJ SOLN
INTRAMUSCULAR | Status: AC
  Administered 2018-06-19: 2 mg
  Filled 2018-06-19: qty 2

## 2018-06-19 MED ORDER — SODIUM CHLORIDE 0.9 % IV SOLN
1.0000 g | Freq: Three times a day (TID) | INTRAVENOUS | Status: DC
  Administered 2018-06-19 – 2018-06-24 (×16): 1 g via INTRAVENOUS
  Filled 2018-06-19 (×16): qty 1

## 2018-06-19 MED ORDER — SODIUM CHLORIDE 0.9% FLUSH
3.0000 mL | Freq: Once | INTRAVENOUS | Status: AC
  Administered 2018-06-21: 3 mL via INTRAVENOUS

## 2018-06-19 MED ORDER — CHLORHEXIDINE GLUCONATE CLOTH 2 % EX PADS
6.0000 | MEDICATED_PAD | Freq: Every day | CUTANEOUS | Status: DC
  Administered 2018-06-21 – 2018-06-26 (×6): 6 via TOPICAL

## 2018-06-19 MED ORDER — SODIUM CHLORIDE 0.9 % IV SOLN
50.0000 ug/h | INTRAVENOUS | Status: DC
  Administered 2018-06-19 – 2018-06-20 (×4): 50 ug/h via INTRAVENOUS
  Filled 2018-06-19 (×5): qty 1

## 2018-06-19 MED ORDER — PROPOFOL 1000 MG/100ML IV EMUL
5.0000 ug/kg/min | INTRAVENOUS | Status: DC
  Administered 2018-06-19 (×2): 10 ug/kg/min via INTRAVENOUS
  Administered 2018-06-19: 30 ug/kg/min via INTRAVENOUS
  Administered 2018-06-20: 45 ug/kg/min via INTRAVENOUS
  Filled 2018-06-19 (×2): qty 100

## 2018-06-19 MED ORDER — INSULIN DETEMIR 100 UNIT/ML ~~LOC~~ SOLN
62.0000 [IU] | Freq: Every day | SUBCUTANEOUS | Status: DC
  Filled 2018-06-19: qty 0.62

## 2018-06-19 MED ORDER — GABAPENTIN 400 MG PO CAPS
800.0000 mg | ORAL_CAPSULE | Freq: Every day | ORAL | Status: DC
  Administered 2018-06-20 – 2018-06-26 (×7): 800 mg via ORAL
  Filled 2018-06-19 (×7): qty 2

## 2018-06-19 MED ORDER — MORPHINE SULFATE 15 MG PO TABS
7.5000 mg | ORAL_TABLET | Freq: Four times a day (QID) | ORAL | Status: DC | PRN
  Administered 2018-06-19 – 2018-06-25 (×16): 7.5 mg via ORAL
  Filled 2018-06-19 (×16): qty 1

## 2018-06-19 MED ORDER — VANCOMYCIN HCL 10 G IV SOLR
1500.0000 mg | Freq: Two times a day (BID) | INTRAVENOUS | Status: DC
  Administered 2018-06-19 – 2018-06-20 (×2): 1500 mg via INTRAVENOUS
  Filled 2018-06-19 (×2): qty 1500

## 2018-06-19 MED ORDER — LIDOCAINE HCL 1 % IJ SOLN
INTRAMUSCULAR | Status: AC
  Filled 2018-06-19: qty 10

## 2018-06-19 MED ORDER — PROPOFOL 1000 MG/100ML IV EMUL
INTRAVENOUS | Status: AC
  Filled 2018-06-19: qty 100

## 2018-06-19 MED ORDER — ONDANSETRON HCL 4 MG/2ML IJ SOLN
4.0000 mg | Freq: Four times a day (QID) | INTRAMUSCULAR | Status: DC | PRN
  Administered 2018-06-25 – 2018-06-26 (×4): 4 mg via INTRAVENOUS
  Filled 2018-06-19 (×4): qty 2

## 2018-06-19 MED ORDER — GLUCAGON HCL RDNA (DIAGNOSTIC) 1 MG IJ SOLR
INTRAMUSCULAR | Status: AC
  Filled 2018-06-19: qty 1

## 2018-06-19 MED ORDER — IOHEXOL 300 MG/ML  SOLN
100.0000 mL | Freq: Once | INTRAMUSCULAR | Status: AC | PRN
  Administered 2018-06-19: 100 mL via INTRAVENOUS

## 2018-06-19 NOTE — ED Notes (Signed)
Report given to Hannah,RN

## 2018-06-19 NOTE — ED Notes (Signed)
Matthew Lewis, wife, [phone]

## 2018-06-19 NOTE — Op Note (Addendum)
[facility] Patient Name: Matthew Lewis Procedure Date: 06/19/2018 MRN: [id number] Attending MD: Juanita Craver , MD Date of Birth: Jun 01, 1955 CSN: [id number] Age: 63 Admit Type: Inpatient Procedure:                Diagnostic EGD. Indications:              Acute post hemorrhagic anemia, Hematochezia,                            History of varices s/p banding last done on                            06/11/2018 Providers:                Juanita Craver, MD, Carlyn Reichert, RN, William Dalton,                            Technician Referring MD:             Mauri Pole, MD Medicines:                Propofol drip. Complications:            No immediate complications. Estimated Blood Loss:     Estimated blood loss: none. Procedure:                Pre-Anesthesia Assessment: - Prior to the                            procedure, a history and physical was performed,                            and patient medications and allergies were                            reviewed. The patient's tolerance of previous                            anesthesia was also reviewed. The risks and                            benefits of the procedure and the sedation options                            and risks were discussed with the patient's wife,                            Matthew Lewis over the phone [phone]. All                            questions were answered, and informed consent was                            obtained. Prior Anticoagulants: The patient has                            taken no  previous anticoagulant or antiplatelet                            agents. ASA Grade Assessment: V - A patient with                            severe systemic disease that is a constant threat                            to life. After reviewing the risks and benefits,                            the patient was deemed in satisfactory condition to                            undergo the procedure.  After obtaining informed                            consent, the endoscope was passed under direct                            vision. Throughout the procedure, the patient's                            blood pressure, pulse, and oxygen saturations were                            monitored continuously. The GIF-H190 ([id number])                            Olympus gastroscope was introduced through the                            mouth, and advanced to the second part of duodenum.                            The EGD was performed with moderate difficulty due                            to poor endoscopic visualization. Successful                            completion of the procedure was aided by lavage.                            The patient tolerated the procedure well. Scope In: Scope Out: Findings:      Diffuse mucosal changes were found in the entire esophagus with complete       obliteration of the varices along the entire length of the esophagus. A       small ulcer was noted in the distal esophagus just above the Z-line but       there was no evidence of bleeding from the ulcer.The esophagus and GEJ       appeared widely  patent.      A large amount of food (residue) was found on the greater curvature of       the stomach.      The cardia and gastric fundus were visualized partially and appeared       normal on retroflexion.      Diffuse nodular mucosa was found in the duodenal bulb.      Diffuse nodular mucosa was found at the major papilla.      There was no evidence of any fresh or old heme in the entire UGI tract. Impression:               - Diffuse mucosal changes along the entire length                            of the esophagus s/p banding with complete                            obliteration of the varices.                           - Small ulcer in the distal esophagus s/p banding                            with no evidence of bleeding from the ulcer.                            - A large amount of food (residue) in the                            stomach-along the GC-visualization was extremely                            limited.                           - The antral mucosa appeared normal; partial                            retroflexed view was normal as well . .                           - Nodular mucosa in the duodenal bulb.                           - Nodular mucosa in the major papilla.                           - There was no fresh or old heme noted on exam.                           - No definite source of blood loss was identified.                           - No specimens collected. Moderate Sedation:      None used. Recommendation:           -  NPO.                           - Continue present medications.                           - Continue serial CBC's.                           - Revisit code status. Procedure Code(s):        --- Professional ---                           [phone], Esophagogastroduodenoscopy, flexible,                            transoral; diagnostic, including collection of                            specimen(s) by brushing or washing, when performed                            (separate procedure) Diagnosis Code(s):        --- Professional ---                           K92.1, Melena (includes Hematochezia)                           D62, Acute posthemorrhagic anemia                           K31.89, Other diseases of stomach and duodenum                           K22.8, Other specified diseases of esophagus CPT copyright 2019 [employer]. All rights reserved. The codes documented in this report are preliminary and upon coder review may  be revised to meet current compliance requirements. Juanita Craver, MD Juanita Craver, MD 06/19/2018 9:01:53 PM This report has been signed electronically. Number of Addenda: 0

## 2018-06-19 NOTE — Progress Notes (Signed)
Pharmacy Antibiotic Note  Matthew Lewis is a 63 y.o. male admitted on 06/19/2018 with sepsis.  Pharmacy has been consulted for meropenem and vancomycin dosing.  Plan: Meropenem 1 Gm IV q8h Vancomycin 2 Gm x1 then 1500 mg IV q12h for est AUC = 514 Goal AUC = 400-550 F/u scr/cultures  Height: 5\' 11"  (180.3 cm) Weight: 207 lb (93.9 kg) IBW/kg (Calculated) : 75.3  Temp (24hrs), Avg:102.9 F (39.4 C), Min:102.7 F (39.3 C), Max:103.1 F (39.5 C)  Recent Labs  Lab 06/12/18 1346 06/19/18 0330  WBC 7.4 15.7*  CREATININE 0.66 0.77  LATICACIDVEN  --  3.0*    Estimated Creatinine Clearance: 109.1 mL/min (by C-G formula based on SCr of 0.77 mg/dL).    Allergies  Allergen Reactions  . Morphine And Related Other (See Comments)    Patient is recovering from drug addiction and wants to avoid any narcotics    Antimicrobials this admission: 6/12 meropenem >>  6/12 vancomycin >>  6/12 cefe/flagyl >> x1 ED  Dose adjustments this admission:   Microbiology results:  BCx:   UCx:    Sputum:    MRSA PCR:  Thank you for allowing pharmacy to be a part of this patient's care.  Dorrene German 06/19/2018 6:44 AM

## 2018-06-19 NOTE — ED Notes (Signed)
Patient aware we need urine sample, urinal at bedside.

## 2018-06-19 NOTE — ED Notes (Signed)
Bed: ZC02 Expected date:  Expected time:  Means of arrival:  Comments: EMS 63 yo male from home-landed on face/lac below eye-busted face-cancer patient/fever/immobilized

## 2018-06-19 NOTE — Progress Notes (Signed)
Transfusion ordered GI consulted

## 2018-06-19 NOTE — Progress Notes (Signed)
Bloody stool  hct 5.9  Will consult GI

## 2018-06-19 NOTE — Progress Notes (Signed)
A consult was received from an ED physician for cefepime and vanomycin per pharmacy dosing.  The patient's profile has been reviewed for ht/wt/allergies/indication/available labs.   A one time order has been placed for Cefepime 2 Gm and Vancomycin 2 Gm .  Further antibiotics/pharmacy consults should be ordered by admitting physician if indicated.                       Thank you, Dorrene German 06/19/2018  3:41 AM

## 2018-06-19 NOTE — Progress Notes (Addendum)
HEMATOLOGY-ONCOLOGY PROGRESS NOTE  SUBJECTIVE: Patient was admitted following a fall at home.  He hit his face and states that he lost consciousness.  He is followed by medical oncology for stage IV cholangiocarcinoma.  Work-up on admission showed an elevated lactic acid, leukocytosis with a white blood cell count 15.7, and anemia with a hemoglobin of 8.0.  He was febrile.  He was admitted for sepsis.  The patient tells me that he felt very well yesterday prior to his fall.  He had abdominal pain yesterday but denies pain today.  He denies nausea and vomiting.  Nursing reports that he has blood in his stool today.  Due for a blood transfusion today.  Denies chest discomfort and shortness of breath.  He has no other complaints today.  Oncology History Overview Note  Cancer Staging Intrahepatic cholangiocarcinoma ([place]) Staging form: Intrahepatic Bile Duct, AJCC 8th Edition - Clinical stage from 10/29/2017: Stage IV (cT2, cN1, pM1) - Signed by [name], MD on 11/06/2017     Malignant neoplasm metastatic to adrenal gland [facility])   Initial Diagnosis   Malignant neoplasm metastatic to adrenal gland ([place])   10/23/2017 Imaging   ABD US IMPRESSION: Cirrhotic appearance of the liver.  2.6 cm questionable ill-defined mass versus area of focal parenchymal heterogeneity in segment 6 of the liver. If further imaging evaluation is desired, liver protocol abdominal MRI should be considered.  Splenomegaly.  Apparent thickening of the gallbladder wall, without evidence of cholelithiasis, and with negative sonographic Murphy's sign. The findings are favored to represent gallbladder wall edema secondary to systemic causes such as hyperproteinemia rather than acute cholecystitis. Please correlate clinically.    10/25/2017 Imaging   MRI IMPRESSION: 1. Advanced changes of cirrhosis. Interval development of multifocal enhancing liver lesions, which is concerning for multicentric hepatocellular carcinoma  involving both lobes of liver. 2. Lesion within segment 4 B obstructs the right medial hepatic duct and left main duct resulting in moderate to marked biliary ductal dilatation within the left lobe and dome of liver. There may be partial obstruction of the right lateral duct with mild dilatation. 3. Tumor thrombus within the left branch of the portal vein likely secondary to segment 4 B tumor. 4. Bilateral adrenal metastasis and retroperitoneal nodal metastasis. 5. Stigmata of portal venous hypertension including varices, ascites and splenomegaly. 6. These results will be called to the ordering clinician or representative by the Radiologist Assistant, and communication documented in the PACS or zVision Dashboard.   10/26/2017 Procedure   IMPRESSION: Status post formal PTC and internal/external biliary drainage.   10/27/2017 Imaging   IMPRESSION: Changes of cirrhosis. Multiple hepatic lesions are noted concerning for multifocal hepatoma or metastases. Biliary drain is in place with the tip in the distal common bile duct. There is mild intrahepatic biliary ductal dilatation. Bilateral adrenal nodules and masses, right larger than left compatible with metastases. Retroperitoneal adenopathy. Large stool burden with mild gaseous distention of the colon. Aortic atherosclerosis.    10/29/2017 Pathology Results   Diagnosis Soft Tissue Needle Core Biopsy, right adrenal mass - METASTATIC ADENOCARCINOMA, SEE COMMENT. Microscopic Comment There are malignant glands with central necrosis. Immunohistochemistry is positive for cytokeratin 20 and CDX-2. Cytokeratin 7, TTF-1 and NapsinA are negative. The morphology and immunoprofile favor a colorectal primary over pancreaticobiliary. [name] has reviewed the case. [name] was notified on 10/31/2017.   11/04/2017 Procedure   Colonoscopy impression - Seton in place found on digital rectal exam. - Congested mucosa in the rectum, in the sigmoid  colon and in the descending colon. - A few diminutive polyps in the rectum and in the sigmoid colon. No colon or rectal mass identified. - No specimens collected.   02/19/2018 Imaging   CT CAP IMPRESSION: 1. Slight interval increase in size of left adrenal gland metastasis. Similar-appearing right adrenal gland metastasis. 2. While difficult to measure given ill-defined nature, infiltrative mass within the left hepatic lobe is grossly similar when compared to prior exam. Additional lesions within the liver are grossly similar. 3. Interval insertion of biliary drain within the left hepatic lobe with resultant decompression of the left hepatic lobe biliary system. 4. Similar-appearing sharply marginated consolidation within the right lower lobe which may represent infectious/inflammatory process or metastatic disease. 5. Large amount of stool throughout the colon as can be seen with constipation. 6. Persistent left hepatic lobe portal venous thrombosis. 7. Splenomegaly. 8. New moderate volume ascites.   Metastatic adenocarcinoma to liver [facility])  10/27/2017 Imaging   10/27/2017 CT AP IMPRESSION: Changes of cirrhosis. Multiple hepatic lesions are noted concerning for multifocal hepatoma or metastases.  Biliary drain is in place with the tip in the distal common bile duct. There is mild intrahepatic biliary ductal dilatation.  Bilateral adrenal nodules and masses, right larger than left compatible with metastases.  Retroperitoneal adenopathy.  Large stool burden with mild gaseous distention of the colon.  Aortic atherosclerosis.   10/29/2017 Cancer Staging   Staging form: Intrahepatic Bile Duct, AJCC 8th Edition - Clinical stage from 10/29/2017: Stage IV (cT2, cN1, pM1) - Signed by [name], MD on 11/06/2017   10/29/2017 Pathology Results   10/29/2017 Surgical Pathology Diagnosis Soft Tissue Needle Core Biopsy, right adrenal mass - METASTATIC ADENOCARCINOMA, SEE  COMMENT.   11/06/2017 Initial Diagnosis   Intrahepatic cholangiocarcinoma ([place])   11/06/2017 Imaging   11/06/2017 CT AP IMPRESSION: 1. Increase in RIGHT pleural effusion. Stable masslike consolidation at the RIGHT lung base representing neoplasm versus pneumonia. 2. Stable metastatic nodule at the RIGHT lung base. 3. Infiltrative mass in the central LEFT hepatic lobe and discrete lesions in the RIGHT hepatic lobe consistent with metastatic disease versus primary hepatic biliary carcinoma. Favor cholangiocarcinoma. 4. Percutaneous drainage catheter extends the porta hepatis. Small trapped pneumothorax along the tract of the catheter at the inferolateral RIGHT lung base. 5. Stable adrenal metastasis. 6. Metastatic adenopathy in the retroperitoneum unchanged. 7. Morphologic changes in liver consistent with  cirrhosis.   11/08/2017 Imaging   11/08/2017 CT AP IMPRESSION: 1. Overall no significant interval change since the CT of 11/06/2017. Stable positioning of the percutaneous biliary drain with similar degree of biliary ductal dilatation. 2. Cirrhosis with multiple hepatic metastatic disease. Infiltrative appearing lesion in the left lobe of the liver appears to extend into the left portal vein and may represent a tumor thrombus, or bland thrombus. A primary biliary neoplasm (cholangiocarcinoma, or metastatic disease are other considerations. 3. Retroperitoneal, mesenteric, and adrenal metastatic disease. 4. Hepatic colopathy versus less likely segmental colitis of the hepatic flexure. 5. Small right pleural effusion and bilateral lung base pulmonary masses/nodules as seen previously. 6. Tiny pockets of pneumoperitoneum lateral to the liver along the percutaneous drain, decreased since the prior CT. 7. Other findings as above.   11/20/2017 - 11/24/2017 Hospital Admission   Admit date: 11/08/2017 Admission diagnosis: fatigue Additional comments: discharged 11/24/2017    12/18/2017 - 02/14/2018 Chemotherapy   first line chemo gemcitabine/cisplatin on days 1 and 15 every 28 days starting 12/18/17. Stopped 02/14/18 after cycle 3.    12/22/2017 - 12/30/2017  Hospital Admission   Admit date: 12/22/2017 Admission diagnosis: Dysuria   12/22/2017 Imaging   12/22/2017 CT AP IMPRESSION: 1. Prominently distended and otherwise normal urinary bladder with mild prostatomegaly. Findings suggest acute bladder outlet obstruction. No hydronephrosis. 2. Large colorectal stool volume compatible with constipation. New mild circumferential rectal wall thickening and perirectal fat haziness suggesting stercoral colitis. No free air. No abscess. 3. Stable infiltrative cholangiocarcinoma in the central left liver lobe with associated peripheral intrahepatic biliary ductal dilatation throughout the left liver lobe. 4. Stable well-positioned internal external drain traversing the right liver lobe and decompressing the right liver biliary system. 5. Stable liver, bilateral adrenal, retroperitoneal, left mesenteric and right infrahilar nodal and left lung base metastases. No progressive metastatic disease since 12/03/2017 CT. 6. Cirrhosis. Trace perihepatic ascites, decreased. Stable moderate splenomegaly. 7.  Aortic Atherosclerosis (ICD10-I70.0).   03/11/2018 -  Chemotherapy   Second lineFOLFOX every 2 weeks started 03/11/2018. Held after cycle 5 due to GI bleeding from Varices.    05/13/2018 Procedure   Upper Endoscopy by Dr Carlean Purl 05/13/18  IMPRESSION - Grade II and large (> 5 mm) esophageal varices. Completely eradicated. Banded. Nipples seen no visible bleeding but he was vomiting blood as could only lightly sedate due to shock. Was not intubated as he was DNR and declined intubation. Wife updateed by phone pre and post procedure - Red blood in the entire stomach. - No specimens collected.      REVIEW OF SYSTEMS:   Constitutional: The patient reports fevers.  Denies  chills. Eyes: Denies blurriness of vision Ears, nose, mouth, throat, and face: Denies mucositis or sore throat Respiratory: Denies cough, dyspnea or wheezes Cardiovascular: Denies palpitation, chest discomfort Gastrointestinal:  Denies nausea, heartburn.  Nursing reports blood in his stool. Skin: Denies abnormal skin rashes Lymphatics: Denies new lymphadenopathy or easy bruising Neurological:Denies numbness, tingling or new weaknesses Behavioral/Psych: Mood is stable, no new changes  Extremities: Has swelling in his lower extremities All other systems were reviewed with the patient and are negative.  I have reviewed the past medical history, past surgical history, social history and family history with the patient and they are unchanged from previous note.   PHYSICAL EXAMINATION:  Vitals:   06/19/18 1400 06/19/18 1415  BP: 100/63 (!) 121/94  Pulse: 97 (!) 109  Resp: 18 (!) 22  Temp:    SpO2: 100% 100%   Filed Weights   06/19/18 0316  Weight: 207 lb (93.9 kg)    Intake/Output from previous day: 06/11 0701 - 06/12 0700 In: [place] [IV Piggyback:[id number]] Out: -   GENERAL:alert, no distress and comfortable.  Chronically ill-appearing. SKIN: Dry oral mucosa.  No thrush. EYES: normal, Conjunctiva are pink and non-injected, sclera clear OROPHARYNX:no exudate, no erythema and lips, buccal mucosa, and tongue normal  NECK: supple, thyroid normal size, non-tender, without nodularity LYMPH:  no palpable lymphadenopathy in the cervical, axillary or inguinal LUNGS: Decreased breath sounds.  Normal breathing effort. HEART: regular rate & rhythm.  Trace bilateral lower extremity edema. ABDOMEN:abdomen soft, positive bowel sounds.  Ascites noted. Musculoskeletal:no cyanosis of digits and no clubbing  NEURO: alert & oriented x 3 with fluent speech, no focal motor/sensory deficits  LABORATORY DATA:  I have reviewed the data as listed CMP Latest Ref Rng & Units 06/19/2018 06/19/2018 06/12/2018   Glucose 70 - 99 mg/dL 145(H) 161(H) 132(H)  BUN 8 - 23 mg/dL 13 12 9   Creatinine 0.61 - 1.24 mg/dL 0.76 0.77 0.66  Sodium 135 - 145 mmol/L 130(L) 127(L) 130(L)  Potassium 3.5 - 5.1 mmol/L 4.2 4.0 4.1  Chloride 98 - 111 mmol/L 105 98 98  CO2 22 - 32 mmol/L 21(L) 18(L) 23  Calcium 8.9 - 10.3 mg/dL 7.2(L) 7.6(L) 8.0(L)  Total Protein 6.5 - 8.1 g/dL 5.9(L) 6.6 7.1  Total Bilirubin 0.3 - 1.2 mg/dL 3.2(H) 2.9(H) 1.3(H)  Alkaline Phos 38 - 126 U/L 257(H) 345(H) 313(H)  AST 15 - 41 U/L 46(H) 51(H) 31  ALT 0 - 44 U/L 22 22 15     Lab Results  Component Value Date   WBC 11.8 (H) 06/19/2018   HGB 5.9 (LL) 06/19/2018   HCT 19.1 (L) 06/19/2018   MCV 91.4 06/19/2018   PLT 157 06/19/2018   NEUTROABS 10.2 (H) 06/19/2018    Ct Head Wo Contrast  Result Date: 06/19/2018 CLINICAL DATA:  Head trauma, minor. Fall. Cholangiocarcinoma with known metastatic disease. Initial encounter. EXAM: CT HEAD WITHOUT CONTRAST CT MAXILLOFACIAL WITHOUT CONTRAST CT CERVICAL SPINE WITHOUT CONTRAST TECHNIQUE: Multidetector CT imaging of the head, cervical spine, and maxillofacial structures were performed using the standard protocol without intravenous contrast. Multiplanar CT image reconstructions of the cervical spine and maxillofacial structures were also generated. COMPARISON:  CT of the head and cervical spine 05/12/2018. MRI brain 02/26/2018 FINDINGS: CT HEAD FINDINGS Brain: No acute infarct, hemorrhage, or mass lesion is present. No significant white matter lesions are present. The ventricles are of normal size. No significant extraaxial fluid collection is present. The brainstem and cerebellum are within normal limits. Vascular: Atherosclerotic calcifications are present in the cavernous right internal carotid artery. There is no hyperdense vessel. Skull: Calvarium is intact. No focal lytic or blastic lesions are present. CT MAXILLOFACIAL FINDINGS Osseous: No acute or healing facial fractures are present. Nasal bones are  intact. Zygomatic arch is normal bilaterally. Orbits: The globes and orbits are within normal limits. Sinuses: The paranasal sinuses and mastoid air cells are clear. Soft tissues: Left infraorbital soft tissue swelling and hematoma is present. There is no significant laceration. No foreign body is present. No other significant soft tissue injury is present. CT CERVICAL SPINE FINDINGS Alignment: There straightening of the normal cervical lordosis. Slight kyphosis is present at C4-5. AP alignment is anatomic. Skull base and vertebrae: The craniocervical junction is normal. Vertebral body heights are maintained. No acute or healing fractures are present. Soft tissues and spinal canal: No prevertebral fluid or swelling. No visible canal hematoma. Vascular calcifications are present at the carotid bifurcations, right greater than left. Disc levels: Solid fusion is present C5-6, C6-7, and C7-T1. Adjacent level disease is present at C4-5 with foraminal narrowing bilaterally due to uncovertebral disease. Right foraminal narrowing is present at C3-4 greater than C2-3 due to uncovertebral and facet disease. Upper chest: The lung apices are clear. Right IJ Port-A-Cath is in place. Thoracic inlet is otherwise normal. IMPRESSION: 1. Left infraorbital facial soft tissue swelling without underlying fracture. 2. Normal CT appearance of the brain for age. No acute intracranial abnormality or trauma to the head. 3. No acute or healing facial fractures. 4. No acute trauma to the cervical spine. 5. Cervical spine fusion at C5-6, C6-7, and C7-T1. 6. Adjacent level disease at C4-5 with bilateral foraminal stenosis. Electronically Signed   By: San Morelle M.D.   On: 06/19/2018 06:15   Ct Cervical Spine Wo Contrast  Result Date: 06/19/2018 CLINICAL DATA:  Head trauma, minor. Fall. Cholangiocarcinoma with known metastatic disease. Initial encounter. EXAM: CT HEAD WITHOUT CONTRAST CT MAXILLOFACIAL WITHOUT CONTRAST CT CERVICAL  SPINE WITHOUT CONTRAST TECHNIQUE:  Multidetector CT imaging of the head, cervical spine, and maxillofacial structures were performed using the standard protocol without intravenous contrast. Multiplanar CT image reconstructions of the cervical spine and maxillofacial structures were also generated. COMPARISON:  CT of the head and cervical spine 05/12/2018. MRI brain 02/26/2018 FINDINGS: CT HEAD FINDINGS Brain: No acute infarct, hemorrhage, or mass lesion is present. No significant white matter lesions are present. The ventricles are of normal size. No significant extraaxial fluid collection is present. The brainstem and cerebellum are within normal limits. Vascular: Atherosclerotic calcifications are present in the cavernous right internal carotid artery. There is no hyperdense vessel. Skull: Calvarium is intact. No focal lytic or blastic lesions are present. CT MAXILLOFACIAL FINDINGS Osseous: No acute or healing facial fractures are present. Nasal bones are intact. Zygomatic arch is normal bilaterally. Orbits: The globes and orbits are within normal limits. Sinuses: The paranasal sinuses and mastoid air cells are clear. Soft tissues: Left infraorbital soft tissue swelling and hematoma is present. There is no significant laceration. No foreign body is present. No other significant soft tissue injury is present. CT CERVICAL SPINE FINDINGS Alignment: There straightening of the normal cervical lordosis. Slight kyphosis is present at C4-5. AP alignment is anatomic. Skull base and vertebrae: The craniocervical junction is normal. Vertebral body heights are maintained. No acute or healing fractures are present. Soft tissues and spinal canal: No prevertebral fluid or swelling. No visible canal hematoma. Vascular calcifications are present at the carotid bifurcations, right greater than left. Disc levels: Solid fusion is present C5-6, C6-7, and C7-T1. Adjacent level disease is present at C4-5 with foraminal narrowing  bilaterally due to uncovertebral disease. Right foraminal narrowing is present at C3-4 greater than C2-3 due to uncovertebral and facet disease. Upper chest: The lung apices are clear. Right IJ Port-A-Cath is in place. Thoracic inlet is otherwise normal. IMPRESSION: 1. Left infraorbital facial soft tissue swelling without underlying fracture. 2. Normal CT appearance of the brain for age. No acute intracranial abnormality or trauma to the head. 3. No acute or healing facial fractures. 4. No acute trauma to the cervical spine. 5. Cervical spine fusion at C5-6, C6-7, and C7-T1. 6. Adjacent level disease at C4-5 with bilateral foraminal stenosis. Electronically Signed   By: San Morelle M.D.   On: 06/19/2018 06:15   US Paracentesis  Result Date: 05/21/2018 INDICATION: Patient with history of abdominal distention, ascites. Request for diagnostic and therapeutic paracentesis. EXAM: ULTRASOUND GUIDED DIAGNOSTIC AND THERAPEUTIC PARACENTESIS MEDICATIONS: 10 mL 1% lidocaine COMPLICATIONS: None immediate. PROCEDURE: Informed written consent was obtained from the patient after a discussion of the risks, benefits and alternatives to treatment. A timeout was performed prior to the initiation of the procedure. Initial ultrasound scanning demonstrates a large amount of ascites within the right lower abdominal quadrant. The right lower abdomen was prepped and draped in the usual sterile fashion. 1% lidocaine was used for local anesthesia. Following this, a 19 gauge, 7-cm, Yueh catheter was introduced. An ultrasound image was saved for documentation purposes. The paracentesis was performed. The catheter was removed and a dressing was applied. The patient tolerated the procedure well without immediate post procedural complication. FINDINGS: A total of approximately 3.9 liters of yellow fluid was removed. Samples were sent to the laboratory as requested by the clinical team. IMPRESSION: Successful ultrasound-guided  therapeutic paracentesis yielding 3.9 liters of peritoneal fluid. Read by: [name] PA-C Electronically Signed   By: Markus Daft M.D.   On: 05/21/2018 12:41   Dg Chest [facility]  Result Date: 06/19/2018 CLINICAL DATA:  Fall. Sepsis. Cholangiocarcinoma with metastatic disease. EXAM: PORTABLE CHEST 1 VIEW COMPARISON:  One-view chest x-ray 05/12/2018 CT of the chest 02/19/2018 FINDINGS: Heart size is normal. Right IJ Port-A-Cath is stable. The tip is in the right atrium. Asymmetric interstitial pattern is again noted on the left. Left basilar metastasis is not well seen. Lung volumes are low. Mild pulmonary vascular congestion is present without frank edema. IMPRESSION: 1. Cardiomegaly with mild vascular congestion. 2. Low lung volumes. 3. Stable chronic interstitial changes, asymmetric on the left. Electronically Signed   By: San Morelle M.D.   On: 06/19/2018 04:27   Ct Maxillofacial Wo Contrast  Result Date: 06/19/2018 CLINICAL DATA:  Head trauma, minor. Fall. Cholangiocarcinoma with known metastatic disease. Initial encounter. EXAM: CT HEAD WITHOUT CONTRAST CT MAXILLOFACIAL WITHOUT CONTRAST CT CERVICAL SPINE WITHOUT CONTRAST TECHNIQUE: Multidetector CT imaging of the head, cervical spine, and maxillofacial structures were performed using the standard protocol without intravenous contrast. Multiplanar CT image reconstructions of the cervical spine and maxillofacial structures were also generated. COMPARISON:  CT of the head and cervical spine 05/12/2018. MRI brain 02/26/2018 FINDINGS: CT HEAD FINDINGS Brain: No acute infarct, hemorrhage, or mass lesion is present. No significant white matter lesions are present. The ventricles are of normal size. No significant extraaxial fluid collection is present. The brainstem and cerebellum are within normal limits. Vascular: Atherosclerotic calcifications are present in the cavernous right internal carotid artery. There is no hyperdense vessel. Skull:  Calvarium is intact. No focal lytic or blastic lesions are present. CT MAXILLOFACIAL FINDINGS Osseous: No acute or healing facial fractures are present. Nasal bones are intact. Zygomatic arch is normal bilaterally. Orbits: The globes and orbits are within normal limits. Sinuses: The paranasal sinuses and mastoid air cells are clear. Soft tissues: Left infraorbital soft tissue swelling and hematoma is present. There is no significant laceration. No foreign body is present. No other significant soft tissue injury is present. CT CERVICAL SPINE FINDINGS Alignment: There straightening of the normal cervical lordosis. Slight kyphosis is present at C4-5. AP alignment is anatomic. Skull base and vertebrae: The craniocervical junction is normal. Vertebral body heights are maintained. No acute or healing fractures are present. Soft tissues and spinal canal: No prevertebral fluid or swelling. No visible canal hematoma. Vascular calcifications are present at the carotid bifurcations, right greater than left. Disc levels: Solid fusion is present C5-6, C6-7, and C7-T1. Adjacent level disease is present at C4-5 with foraminal narrowing bilaterally due to uncovertebral disease. Right foraminal narrowing is present at C3-4 greater than C2-3 due to uncovertebral and facet disease. Upper chest: The lung apices are clear. Right IJ Port-A-Cath is in place. Thoracic inlet is otherwise normal. IMPRESSION: 1. Left infraorbital facial soft tissue swelling without underlying fracture. 2. Normal CT appearance of the brain for age. No acute intracranial abnormality or trauma to the head. 3. No acute or healing facial fractures. 4. No acute trauma to the cervical spine. 5. Cervical spine fusion at C5-6, C6-7, and C7-T1. 6. Adjacent level disease at C4-5 with bilateral foraminal stenosis. Electronically Signed   By: San Morelle M.D.   On: 06/19/2018 06:15   Ir Paracentesis  Result Date: 06/15/2018 INDICATION: Patient with history of  intrahepatic cholangiocarcinoma, NASH cirrhosis, recurrent ascites. Request made for therapeutic paracentesis. EXAM: ULTRASOUND GUIDED THERAPEUTIC PARACENTESIS MEDICATIONS: None COMPLICATIONS: None immediate. PROCEDURE: Informed written consent was obtained from the patient after a discussion of the risks, benefits and alternatives to treatment. A timeout was performed prior  to the initiation of the procedure. Initial ultrasound scanning demonstrates a moderate-to-large amount of ascites within the right lower abdominal quadrant. The right lower abdomen was prepped and draped in the usual sterile fashion. 1% lidocaine was used for local anesthesia. Following this, a 19 gauge, 10-cm, Yueh catheter was introduced. An ultrasound image was saved for documentation purposes. The paracentesis was performed. The catheter was removed and a dressing was applied. The patient tolerated the procedure well without immediate post procedural complication. FINDINGS: A total of approximately 4.7 liters of yellow fluid was removed. IMPRESSION: Successful ultrasound-guided therapeutic paracentesis yielding 4.7 liters of peritoneal fluid. Read by: Rowe Robert, PA-C Electronically Signed   By: Aletta Edouard M.D.   On: 06/15/2018 13:56   Ir Paracentesis  Result Date: 06/08/2018 INDICATION: Patient with history of intrahepatic cholangiocarcinoma, NASH cirrhosis, recurrent ascites. Request made for therapeutic paracentesis. EXAM: ULTRASOUND GUIDED THERAPEUTIC PARACENTESIS MEDICATIONS: None COMPLICATIONS: None immediate. PROCEDURE: Informed written consent was obtained from the patient after a discussion of the risks, benefits and alternatives to treatment. A timeout was performed prior to the initiation of the procedure. Initial ultrasound scanning demonstrates a moderate to large amount of ascites within the right lower abdominal quadrant. The right lower abdomen was prepped and draped in the usual sterile fashion. 1% lidocaine was  used for local anesthesia. Following this, a 19 gauge, 10-cm, Yueh catheter was introduced. An ultrasound image was saved for documentation purposes. The paracentesis was performed. The catheter was removed and a dressing was applied. The patient tolerated the procedure well without immediate post procedural complication. FINDINGS: A total of approximately 4.8 liters of yellow fluid was removed. IMPRESSION: Successful ultrasound-guided paracentesis yielding 4.8 liters of peritoneal fluid. Read by: Rowe Robert, PA-C Electronically Signed   By: Sandi Mariscal M.D.   On: 06/08/2018 10:40   Ir Paracentesis  Result Date: 06/02/2018 INDICATION: Patient with history of intrahepatic cholangiocarcinoma, NASH cirrhosis, recurrent ascites. Request made for therapeutic paracentesis. EXAM: ULTRASOUND GUIDED THERAPEUTIC PARACENTESIS MEDICATIONS: None COMPLICATIONS: None immediate. PROCEDURE: Informed written consent was obtained from the patient after a discussion of the risks, benefits and alternatives to treatment. A timeout was performed prior to the initiation of the procedure. Initial ultrasound scanning demonstrates a large amount of ascites within the right lower abdominal quadrant. The right lower abdomen was prepped and draped in the usual sterile fashion. 1% lidocaine was used for local anesthesia. Following this, a 19 gauge, 10-cm, Yueh catheter was introduced. An ultrasound image was saved for documentation purposes. The paracentesis was performed. The catheter was removed and a dressing was applied. The patient tolerated the procedure well without immediate post procedural complication. FINDINGS: A total of approximately 5.3 liters of yellow fluid was removed. IMPRESSION: Successful ultrasound-guided therapeutic paracentesis yielding 5.3 liters of peritoneal fluid. Read by: Rowe Robert, PA-C Electronically Signed   By: Sandi Mariscal M.D.   On: 06/02/2018 15:45   Ir Paracentesis  Result Date:  05/26/2018 INDICATION: Patient with history of NASH cirrhosis, intrahepatic cholangiocarcinoma, and recurrent ascites. Request is made for diagnostic and therapeutic paracentesis with a max of 5 L. EXAM: ULTRASOUND GUIDED DIAGNOSTIC AND THERAPEUTIC PARACENTESIS MEDICATIONS: 10 mL 1% lidocaine COMPLICATIONS: None immediate. PROCEDURE: Informed written consent was obtained from the patient after a discussion of the risks, benefits and alternatives to treatment. A timeout was performed prior to the initiation of the procedure. Initial ultrasound scanning demonstrates a large amount of ascites within the left lower abdominal quadrant. The left lower abdomen was prepped and draped in  the usual sterile fashion. 1% lidocaine was used for local anesthesia. Following this, a 19 gauge, 10-cm, Yueh catheter was introduced. An ultrasound image was saved for documentation purposes. The paracentesis was performed. The catheter was removed and a dressing was applied. The patient tolerated the procedure well without immediate post procedural complication. FINDINGS: A total of approximately 5 L of clear yellow fluid was removed. Samples were sent to the laboratory as requested by the clinical team. IMPRESSION: Successful ultrasound-guided paracentesis yielding 5 L of peritoneal fluid. Read by: Earley Abide, PA-C Electronically Signed   By: Markus Daft M.D.   On: 05/26/2018 15:10    ASSESSMENT AND PLAN: 1.  Stage IV cholangiocarcinoma 2.  Anemia secondary to GI bleed from duodenal ulcers and varices 3.  Sepsis from unclear source 4.  Obstructive jaundice status post bilateral percutaneous biliary drain 5.  Portal vein thrombosis 6.  Cirrhosis 7.  Goals of care, limited code  -Hemoglobin is down to 5.9 today.  He is scheduled to receive 1 unit of packed red blood cells.  Recommend daily CBC and transfuse packed red blood cells if hemoglobin is less than 7.0 or active bleeding. -Agree with GI consult due to melena. -CT  of the abdomen and pelvis has been ordered due to his of abdominal pain and is pending. -The patient has been febrile with a temperature up to 103.1 in the past 24 hours.  Blood cultures are negative to date.  Urine culture is pending.  Continue antibiotics per hospitalist. -Paracentesis later today. -The patient is a partial code -patient agreeable to pressors, but does not want CPR or mechanical ventilation.  Per previous discussions, the patient has been agreeable for palliative care in his home.   -His [facility] genomic test came back yesterday, which showed KRAS/NRAS/BRAF wild type, no other targetable mutation.    LOS: 0 days   Mikey Bussing, DNP, AGPCNP-BC, AOCNP 06/19/18  Addendum  I have seen the patient, examined him. I agree with the assessment and and plan and have edited the notes.   Pt has diffusely metastatic cancer, off chemo for over a month due to recent severe GI bleeding, and overall declining. We have discussed hospice in clinic, but he is not ready. He agrees with DNR/DNI, but OK with intubation for procedures. Will continue supportive care for now. I will f/u on Monday, please do not hesitate to call us if needed over the weekend.   I called his wife Rollene Fare and left her a VM to update his status.   [name]  06/19/2018

## 2018-06-19 NOTE — ED Notes (Signed)
ED TO INPATIENT HANDOFF REPORT  ED Nurse Name and Phone #: [place] G, [phone]  S Name/Age/Gender Matthew Lewis 63 y.o. male Room/Bed: WA15/WA15  Code Status   Code Status: Prior  Home/SNF/Other Home Patient oriented to: self, place, time and situation Is this baseline? Yes   Triage Complete: Triage complete  Chief Complaint Fall; Fever  Triage Note Arrives via EMS from home. C/C fall that occurred at 0200. Patient was walking to the kitchen to get some water and does not remember how he fell and he does remember falling. Patient seems to have fallen face forward and has two busted lips. New onset fever, has been feeling unwell today. Patient also complaining of back pain, c-collar upon arrival.    Allergies Allergies  Allergen Reactions  . Morphine And Related Other (See Comments)    Patient is recovering from drug addiction and wants to avoid any narcotics    Level of Care/Admitting Diagnosis ED Disposition    ED Disposition Condition [place]: [place] [[id number]]  Level of Care: Stepdown [14]  Admit to SDU based on following criteria: Severe physiological/psychological symptoms:  Any diagnosis requiring assessment & intervention at least every 4 hours on an ongoing basis to obtain desired patient outcomes including stability and rehabilitation  Covid Evaluation: N/A  Diagnosis: SIRS (systemic inflammatory response syndrome) [facility]) [[id number]]  Admitting Physician: Rise Patience [phone]  Attending Physician: Rise Patience [phone]  Estimated length of stay: past midnight tomorrow  Certification:: I certify this patient will need inpatient services for at least 2 midnights  PT Class (Do Not Modify): Inpatient [101]  PT Acc Code (Do Not Modify): Private [1]       B Medical/Surgery History Past Medical History:  Diagnosis Date  . Abscess of anal and rectal regions    horse shoe abscess  . Alcoholic cirrhosis  ([place])   . Arthritis    "everywhere; especially in my spine"  . Cirrhosis ([place])    STAGE 1 CIRRHOSIS PATIENT SEES DR NAMDIGAM FOR  . Colon cancer ([place]) 05/2005   TX SURGERY WITH LYMPH NODE REMOVAL  . Diabetes mellitus without complication ([place])    type 2  . DM type 2 with diabetic peripheral neuropathy ([facility])    left foot  . Fatigue   . Fistula, anal    multiple  . History of substance abuse ([place])    last alcohol was 08/2011; last marijuania was 08/2011  . Hypertension    no bp meds in last year  . PTSD (post-traumatic stress disorder)    "severe" HX OF  . Sleep deprivation    Past Surgical History:  Procedure Laterality Date  . ABSCESS DRAINAGE     "probably 15-20 so far at least; rectal"  . ANAL FISTULECTOMY  12/13/1998  . ANTERIOR FUSION CERVICAL SPINE  2010   "triple"; "did 2 surgeries on the same day in 4193 due to complications"  . CORONARY ANGIOPLASTY  2001  . detached muscle  2010   right chest; "after cervical fusion complications"  . ELBOW SURGERY  2007   "cut out part of a muscle"; left  . ESOPHAGEAL BANDING  02/25/2018   Procedure: ESOPHAGEAL BANDING;  Surgeon: Ladene Artist, MD;  Location: [facility] ENDOSCOPY;  Service: Endoscopy;;  . ESOPHAGEAL BANDING  05/12/2018   Procedure: ESOPHAGEAL BANDING;  Surgeon: Gatha Mayer, MD;  Location: [facility] ENDOSCOPY;  Service: Endoscopy;;  . ESOPHAGEAL BANDING  05/14/2018   Procedure: ESOPHAGEAL  BANDING;  Surgeon: Yetta Flock, MD;  Location: [facility] ENDOSCOPY;  Service: Gastroenterology;;  . ESOPHAGEAL BANDING  06/11/2018   Procedure: ESOPHAGEAL BANDING;  Surgeon: Mauri Pole, MD;  Location: [facility] ENDOSCOPY;  Service: Endoscopy;;  . ESOPHAGOGASTRODUODENOSCOPY N/A 05/12/2018   Procedure: ESOPHAGOGASTRODUODENOSCOPY (EGD);  Surgeon: Gatha Mayer, MD;  Location: [facility] ENDOSCOPY;  Service: Endoscopy;  Laterality: N/A;  . ESOPHAGOGASTRODUODENOSCOPY (EGD) WITH PROPOFOL N/A 02/25/2018   Procedure: ESOPHAGOGASTRODUODENOSCOPY (EGD) WITH  PROPOFOL;  Surgeon: Ladene Artist, MD;  Location[facility] END[facility]COPY;  Service: Endoscopy;  Laterality: N/A;  . ESOPHAGOGASTRODUODENOSCOPY (EGD) WITH PROPOFOL N/A 05/14/2018   Procedure: ESOPHAGOGASTRODUODENOSCOPY (EGD) WITH PROPOFOL;  Surgeon: Yetta Flock, MD;  Location: [facility] ENDOSCOPY;  Service: Gastroenterology;  Laterality: N/A;  . ESOPHAGOGASTRODUODENOSCOPY (EGD) WITH PROPOFOL N/A 06/11/2018   Procedure: ESOPHAGOGASTRODUODENOSCOPY (EGD) WITH PROPOFOL;  Surgeon: Mauri Pole, MD;  Location: [facility] ENDOSCOPY;  Service: Endoscopy;  Laterality: N/A;  . EXAMINATION UNDER ANESTHESIA  03/22/2005   fistula  . EXAMINATION UNDER ANESTHESIA  06/06/2011   Procedure: EXAM UNDER ANESTHESIA;  Surgeon: Stark Klein, MD;  Location: [place];  Service: General;  Laterality: N/A;  . HEMICOLECTOMY  06/06/2005   left  . INCISION AND DRAINAGE PERIRECTAL ABSCESS  03/30/2005  . INCISION AND DRAINAGE PERIRECTAL ABSCESS  12/13/2004  . INCISION AND DRAINAGE PERIRECTAL ABSCESS  07/14/2001  . INCISION AND DRAINAGE PERIRECTAL ABSCESS  08/12/2010   horseshoe abscess; Dr Redmond Pulling  . INCISION AND DRAINAGE PERIRECTAL ABSCESS  06/06/2011   Procedure: IRRIGATION AND DEBRIDEMENT PERIRECTAL ABSCESS;  Surgeon: Stark Klein, MD;  Location: [place];  Service: General;  Laterality: N/A;  . INCISION AND DRAINAGE PERIRECTAL ABSCESS N/A 02/24/2017   Procedure: IRRIGATION AND DEBRIDEMENT PERIRECTAL ABSCESS;  Surgeon: Ralene Ok, MD;  Location: [place];  Service: General;  Laterality: N/A;  . IR CATHETER TUBE CHANGE  02/19/2018  . IR CHOLANGIOGRAM EXISTING TUBE  12/23/2017  . IR CHOLANGIOGRAM EXISTING TUBE  01/02/2018  . IR CHOLANGIOGRAM EXISTING TUBE  05/04/2018  . IR EXCHANGE BILIARY DRAIN  11/07/2017  . IR EXCHANGE BILIARY DRAIN  12/09/2017  . IR EXCHANGE BILIARY DRAIN  02/03/2018  . IR EXCHANGE BILIARY DRAIN  02/03/2018  . IR EXCHANGE BILIARY DRAIN  04/03/2018  . IR EXCHANGE BILIARY DRAIN  04/03/2018  . IR EXCHANGE BILIARY DRAIN  04/23/2018   . IR EXCHANGE BILIARY DRAIN  04/28/2018  . IR EXCHANGE BILIARY DRAIN  04/28/2018  . IR FLUORO RM 30-60 MIN  04/15/2018  . IR IMAGING GUIDED PORT INSERTION  12/09/2017  . IR INT EXT BILIARY DRAIN WITH CHOLANGIOGRAM  10/26/2017  . IR INT EXT BILIARY DRAIN WITH CHOLANGIOGRAM  12/23/2017  . IR PARACENTESIS  02/25/2018  . IR PARACENTESIS  05/26/2018  . IR PARACENTESIS  06/02/2018  . IR PARACENTESIS  06/08/2018  . IR PARACENTESIS  06/15/2018  . IR PATIENT EVAL TECH 0-60 MINS  12/02/2017  . IR RADIOLOGIST EVAL & MGMT  11/06/2017  . IR RADIOLOGIST EVAL & MGMT  01/05/2018  . [place]  . PERCUTANEOUS PINNING PHALANX FRACTURE OF HAND  ~ 2008   "plates in 2 places"; right  . PLACEMENT OF SETON N/A 09/05/2017   Procedure: PLACEMENT OF SETON;  Surgeon: Leighton Ruff, MD;  Location: [facility];  Service: General;  Laterality: N/A;  . RADIOLOGY WITH ANESTHESIA N/A 10/25/2017   Procedure: MRI WITH ANESTHESIA;  Surgeon: Radiologist, Medication, MD;  Location: [place];  Service: Radiology;  Laterality: N/A;  . THORACIC DISCECTOMY  1990's     A IV Location/Drains/Wounds Patient Lines/Drains/Airways Status   Active Line/Drains/Airways    Name:   Placement date:   Placement time:   Site:   Days:   Implanted Port 12/09/17 Right Chest   12/09/17    1057    Chest   192   Peripheral IV 06/19/18 Left;Lateral Forearm   06/19/18    0342    Forearm   less than 1   Biliary Tube VTCB biliary 10.2 Fr. RUQ   04/23/18    1553    RUQ   57   Biliary Tube VTCB biliary 10.2 Fr. LUQ   04/03/18    -    LUQ   77   Biliary Tube VTCB biliary 10.2 Fr. LUQ   04/28/18    0917    LUQ   52   Biliary Tube VTCB biliary 12 Fr. RUQ   04/28/18    0925    RUQ   52          Intake/Output Last 24 hours  Intake/Output Summary (Last 24 hours) at 06/19/2018 0622 Last data filed at 06/19/2018 3536 Gross per 24 hour  Intake 3830 ml  Output -  Net 3830 ml    Labs/Imaging Results for orders placed or performed  during the hospital encounter of 06/19/18 (from the past 48 hour(s))  Comprehensive metabolic panel     Status: Abnormal   Collection Time: 06/19/18  3:30 AM  Result Value Ref Range   Sodium 127 (L) 135 - 145 mmol/L   Potassium 4.0 3.5 - 5.1 mmol/L   Chloride 98 98 - 111 mmol/L   CO2 18 (L) 22 - 32 mmol/L   Glucose, Bld 161 (H) 70 - 99 mg/dL   BUN 12 8 - 23 mg/dL   Creatinine, Ser 0.77 0.61 - 1.24 mg/dL   Calcium 7.6 (L) 8.9 - 10.3 mg/dL   Total Protein 6.6 6.5 - 8.1 g/dL   Albumin 2.2 (L) 3.5 - 5.0 g/dL   AST 51 (H) 15 - 41 U/L   ALT 22 0 - 44 U/L   Alkaline Phosphatase 345 (H) 38 - 126 U/L   Total Bilirubin 2.9 (H) 0.3 - 1.2 mg/dL   GFR calc non Af Amer >60 >60 mL/min   GFR calc Af Amer >60 >60 mL/min   Anion gap 11 5 - 15    Comment: Performed at [facility], [place] [address] [place], [place] [address]  Lactic acid, plasma     Status: Abnormal   Collection Time: 06/19/18  3:30 AM  Result Value Ref Range   Lactic Acid, Venous 3.0 (HH) 0.5 - 1.9 mmol/L    Comment: CRITICAL RESULT CALLED TO, READ BACK BY AND VERIFIED WITH: RN Zackery Barefoot [id number] 06/19/18 CRU[place] A Performed at [place] [address] [place],  [address]   CBC with Differential     Status: Abnormal   Collection Time: 06/19/18  3:30 AM  Result Value Ref Range   WBC 15.7 (H) 4.0 - 10.5 K/uL   RBC 2.84 (L) 4.22 - 5.81 MIL/uL   Hemoglobin 8.0 (L) 13.0 - 17.0 g/dL   HCT 25.4 (L) 39.0 - 52.0 %   MCV 89.4 80.0 - 100.0 fL   MCH 28.2 26.0 - 34.0 pg   MCHC 31.5 30.0 - 36.0 g/dL   RDW 16.7 (H) 11.5 - 15.5 %   Platelets 228 150 - 400 K/uL   nRBC 0.0 0.0 -  0.2 %   Neutrophils Relative % 88 %   Neutro Abs 14.0 (H) 1.7 - 7.7 K/uL   Lymphocytes Relative 4 %   Lymphs Abs 0.6 (L) 0.7 - 4.0 K/uL   Monocytes Relative 6 %   Monocytes Absolute 0.9 0.1 - 1.0 K/uL   Eosinophils Relative 1 %   Eosinophils Absolute 0.1 0.0 - 0.5 K/uL   Basophils Relative 0 %   Basophils  Absolute 0.0 0.0 - 0.1 K/uL   Immature Granulocytes 1 %   Abs Immature Granulocytes 0.15 (H) 0.00 - 0.07 K/uL    Comment: Performed at [facility], [place] [address] [place], [place] [address]  Protime-INR     Status: Abnormal   Collection Time: 06/19/18  3:30 AM  Result Value Ref Range   Prothrombin Time 17.7 (H) 11.4 - 15.2 seconds   INR 1.5 (H) 0.8 - 1.2    Comment: (NOTE) INR goal varies based on device and disease states. Performed at [facility], [place] [address] [place], [place] [address]   SARS Coronavirus 2 (CEPHEID- Performed in [place] hospital lab), Hosp Order     Status: None   Collection Time: 06/19/18  3:30 AM   Specimen: Nasopharyngeal Swab  Result Value Ref Range   SARS Coronavirus 2 NEGATIVE NEGATIVE    Comment: (NOTE) If result is NEGATIVE SARS-CoV-2 target nucleic acids are NOT DETECTED. The SARS-CoV-2 RNA is generally detectable in upper and lower  respiratory specimens during the acute phase of infection. The lowest  concentration of SARS-CoV-2 viral copies this assay can detect is 250  copies / mL. A negative result does not preclude SARS-CoV-2 infection  and should not be used as the sole basis for treatment or other  patient management decisions.  A negative result may occur with  improper specimen collection / handling, submission of specimen other  than nasopharyngeal swab, presence of viral mutation(s) within the  areas targeted by this assay, and inadequate number of viral copies  (<250 copies / mL). A negative result must be combined with clinical  observations, patient history, and epidemiological information. If result is POSITIVE SARS-CoV-2 target nucleic acids are DETECTED. The SARS-CoV-2 RNA is generally detectable in upper and lower  respiratory specimens dur ing the acute phase of infection.  Positive  results are indicative of active infection with SARS-CoV-2.  Clinical  correlation with patient  history and other diagnostic information is  necessary to determine patient infection status.  Positive results do  not rule out bacterial infection or co-infection with other viruses. If result is PRESUMPTIVE POSTIVE SARS-CoV-2 nucleic acids MAY BE PRESENT.   A presumptive positive result was obtained on the submitted specimen  and confirmed on repeat testing.  While 2019 novel coronavirus  (SARS-CoV-2) nucleic acids may be present in the submitted sample  additional confirmatory testing may be necessary for epidemiological  and / or clinical management purposes  to differentiate between  SARS-CoV-2 and other Sarbecovirus currently known to infect humans.  If clinically indicated additional testing with an alternate test  methodology [phone]) is advised. The SARS-CoV-2 RNA is generally  detectable in upper and lower respiratory sp ecimens during the acute  phase of infection. The expected result is Negative. Fact Sheet for Patients:  StrictlyIdeas.no Fact Sheet for Healthcare Providers: BankingDealers.co.za This test is not yet approved or cleared by the [place] FDA and has been authorized for detection and/or diagnosis of SARS-CoV-2 by FDA under an Emergency Use Authorization (EUA).  This EUA will remain in effect (meaning this test can be used) for the duration of the COVID-19 declaration under Section 564(b)(1) of the Act, 21 U.S.C. section 360bbb-3(b)(1), unless the authorization is terminated or revoked sooner. Performed at [facility], [place] [address], [place], [place] [address]   Urinalysis, Routine w reflex microscopic     Status: Abnormal   Collection Time: 06/19/18  4:40 AM  Result Value Ref Range   Color, Urine AMBER (A) YELLOW    Comment: BIOCHEMICALS MAY BE AFFECTED BY COLOR   APPearance HAZY (A) CLEAR   Specific Gravity, Urine 1.015 1.005 - 1.030   pH 5.0 5.0 - 8.0   Glucose, UA NEGATIVE  NEGATIVE mg/dL   Hgb urine dipstick NEGATIVE NEGATIVE   Bilirubin Urine NEGATIVE NEGATIVE   Ketones, ur NEGATIVE NEGATIVE mg/dL   Protein, ur NEGATIVE NEGATIVE mg/dL   Nitrite NEGATIVE NEGATIVE   Leukocytes,Ua NEGATIVE NEGATIVE    Comment: Performed at [facility] [address] [place], [place] [address]   Ct Head Wo Contrast  Result Date: 06/19/2018 CLINICAL DATA:  Head trauma, minor. Fall. Cholangiocarcinoma with known metastatic disease. Initial encounter. EXAM: CT HEAD WITHOUT CONTRAST CT MAXILLOFACIAL WITHOUT CONTRAST CT CERVICAL SPINE WITHOUT CONTRAST TECHNIQUE: Multidetector CT imaging of the head, cervical spine, and maxillofacial structures were performed using the standard protocol without intravenous contrast. Multiplanar CT image reconstructions of the cervical spine and maxillofacial structures were also generated. COMPARISON:  CT of the head and cervical spine 05/12/2018. MRI brain 02/26/2018 FINDINGS: CT HEAD FINDINGS Brain: No acute infarct, hemorrhage, or mass lesion is present. No significant white matter lesions are present. The ventricles are of normal size. No significant extraaxial fluid collection is present. The brainstem and cerebellum are within normal limits. Vascular: Atherosclerotic calcifications are present in the cavernous right internal carotid artery. There is no hyperdense vessel. Skull: Calvarium is intact. No focal lytic or blastic lesions are present. CT MAXILLOFACIAL FINDINGS Osseous: No acute or healing facial fractures are present. Nasal bones are intact. Zygomatic arch is normal bilaterally. Orbits: The globes and orbits are within normal limits. Sinuses: The paranasal sinuses and mastoid air cells are clear. Soft tissues: Left infraorbital soft tissue swelling and hematoma is present. There is no significant laceration. No foreign body is present. No other significant soft tissue injury is present. CT CERVICAL SPINE FINDINGS Alignment:  There straightening of the normal cervical lordosis. Slight kyphosis is present at C4-5. AP alignment is anatomic. Skull base and vertebrae: The craniocervical junction is normal. Vertebral body heights are maintained. No acute or healing fractures are present. Soft tissues and spinal canal: No prevertebral fluid or swelling. No visible canal hematoma. Vascular calcifications are present at the carotid bifurcations, right greater than left. Disc levels: Solid fusion is present C5-6, C6-7, and C7-T1. Adjacent level disease is present at C4-5 with foraminal narrowing bilaterally due to uncovertebral disease. Right foraminal narrowing is present at C3-4 greater than C2-3 due to uncovertebral and facet disease. Upper chest: The lung apices are clear. Right IJ Port-A-Cath is in place. Thoracic inlet is otherwise normal. IMPRESSION: 1. Left infraorbital facial soft tissue swelling without underlying fracture. 2. Normal CT appearance of the brain for age. No acute intracranial abnormality or trauma to the head. 3. No acute or healing facial fractures. 4. No acute trauma to the cervical spine. 5. Cervical spine fusion at C5-6, C6-7, and C7-T1. 6. Adjacent level disease at C4-5 with bilateral foraminal stenosis. Electronically Signed   By:  San Morelle M.D.   On: 06/19/2018 06:15   Ct Cervical Spine Wo Contrast  Result Date: 06/19/2018 CLINICAL DATA:  Head trauma, minor. Fall. Cholangiocarcinoma with known metastatic disease. Initial encounter. EXAM: CT HEAD WITHOUT CONTRAST CT MAXILLOFACIAL WITHOUT CONTRAST CT CERVICAL SPINE WITHOUT CONTRAST TECHNIQUE: Multidetector CT imaging of the head, cervical spine, and maxillofacial structures were performed using the standard protocol without intravenous contrast. Multiplanar CT image reconstructions of the cervical spine and maxillofacial structures were also generated. COMPARISON:  CT of the head and cervical spine 05/12/2018. MRI brain 02/26/2018 FINDINGS: CT HEAD  FINDINGS Brain: No acute infarct, hemorrhage, or mass lesion is present. No significant white matter lesions are present. The ventricles are of normal size. No significant extraaxial fluid collection is present. The brainstem and cerebellum are within normal limits. Vascular: Atherosclerotic calcifications are present in the cavernous right internal carotid artery. There is no hyperdense vessel. Skull: Calvarium is intact. No focal lytic or blastic lesions are present. CT MAXILLOFACIAL FINDINGS Osseous: No acute or healing facial fractures are present. Nasal bones are intact. Zygomatic arch is normal bilaterally. Orbits: The globes and orbits are within normal limits. Sinuses: The paranasal sinuses and mastoid air cells are clear. Soft tissues: Left infraorbital soft tissue swelling and hematoma is present. There is no significant laceration. No foreign body is present. No other significant soft tissue injury is present. CT CERVICAL SPINE FINDINGS Alignment: There straightening of the normal cervical lordosis. Slight kyphosis is present at C4-5. AP alignment is anatomic. Skull base and vertebrae: The craniocervical junction is normal. Vertebral body heights are maintained. No acute or healing fractures are present. Soft tissues and spinal canal: No prevertebral fluid or swelling. No visible canal hematoma. Vascular calcifications are present at the carotid bifurcations, right greater than left. Disc levels: Solid fusion is present C5-6, C6-7, and C7-T1. Adjacent level disease is present at C4-5 with foraminal narrowing bilaterally due to uncovertebral disease. Right foraminal narrowing is present at C3-4 greater than C2-3 due to uncovertebral and facet disease. Upper chest: The lung apices are clear. Right IJ Port-A-Cath is in place. Thoracic inlet is otherwise normal. IMPRESSION: 1. Left infraorbital facial soft tissue swelling without underlying fracture. 2. Normal CT appearance of the brain for age. No acute  intracranial abnormality or trauma to the head. 3. No acute or healing facial fractures. 4. No acute trauma to the cervical spine. 5. Cervical spine fusion at C5-6, C6-7, and C7-T1. 6. Adjacent level disease at C4-5 with bilateral foraminal stenosis. Electronically Signed   By: San Morelle M.D.   On: 06/19/2018 06:15   Dg Chest Port 1 View  Result Date: 06/19/2018 CLINICAL DATA:  Fall. Sepsis. Cholangiocarcinoma with metastatic disease. EXAM: PORTABLE CHEST 1 VIEW COMPARISON:  One-view chest x-ray 05/12/2018 CT of the chest 02/19/2018 FINDINGS: Heart size is normal. Right IJ Port-A-Cath is stable. The tip is in the right atrium. Asymmetric interstitial pattern is again noted on the left. Left basilar metastasis is not well seen. Lung volumes are low. Mild pulmonary vascular congestion is present without frank edema. IMPRESSION: 1. Cardiomegaly with mild vascular congestion. 2. Low lung volumes. 3. Stable chronic interstitial changes, asymmetric on the left. Electronically Signed   By: San Morelle M.D.   On: 06/19/2018 04:27   Ct Maxillofacial Wo Contrast  Result Date: 06/19/2018 CLINICAL DATA:  Head trauma, minor. Fall. Cholangiocarcinoma with known metastatic disease. Initial encounter. EXAM: CT HEAD WITHOUT CONTRAST CT MAXILLOFACIAL WITHOUT CONTRAST CT CERVICAL SPINE WITHOUT CONTRAST TECHNIQUE: Multidetector CT  imaging of the head, cervical spine, and maxillofacial structures were performed using the standard protocol without intravenous contrast. Multiplanar CT image reconstructions of the cervical spine and maxillofacial structures were also generated. COMPARISON:  CT of the head and cervical spine 05/12/2018. MRI brain 02/26/2018 FINDINGS: CT HEAD FINDINGS Brain: No acute infarct, hemorrhage, or mass lesion is present. No significant white matter lesions are present. The ventricles are of normal size. No significant extraaxial fluid collection is present. The brainstem and cerebellum  are within normal limits. Vascular: Atherosclerotic calcifications are present in the cavernous right internal carotid artery. There is no hyperdense vessel. Skull: Calvarium is intact. No focal lytic or blastic lesions are present. CT MAXILLOFACIAL FINDINGS Osseous: No acute or healing facial fractures are present. Nasal bones are intact. Zygomatic arch is normal bilaterally. Orbits: The globes and orbits are within normal limits. Sinuses: The paranasal sinuses and mastoid air cells are clear. Soft tissues: Left infraorbital soft tissue swelling and hematoma is present. There is no significant laceration. No foreign body is present. No other significant soft tissue injury is present. CT CERVICAL SPINE FINDINGS Alignment: There straightening of the normal cervical lordosis. Slight kyphosis is present at C4-5. AP alignment is anatomic. Skull base and vertebrae: The craniocervical junction is normal. Vertebral body heights are maintained. No acute or healing fractures are present. Soft tissues and spinal canal: No prevertebral fluid or swelling. No visible canal hematoma. Vascular calcifications are present at the carotid bifurcations, right greater than left. Disc levels: Solid fusion is present C5-6, C6-7, and C7-T1. Adjacent level disease is present at C4-5 with foraminal narrowing bilaterally due to uncovertebral disease. Right foraminal narrowing is present at C3-4 greater than C2-3 due to uncovertebral and facet disease. Upper chest: The lung apices are clear. Right IJ Port-A-Cath is in place. Thoracic inlet is otherwise normal. IMPRESSION: 1. Left infraorbital facial soft tissue swelling without underlying fracture. 2. Normal CT appearance of the brain for age. No acute intracranial abnormality or trauma to the head. 3. No acute or healing facial fractures. 4. No acute trauma to the cervical spine. 5. Cervical spine fusion at C5-6, C6-7, and C7-T1. 6. Adjacent level disease at C4-5 with bilateral foraminal  stenosis. Electronically Signed   By: San Morelle M.D.   On: 06/19/2018 06:15    Pending Labs Unresulted Labs (From admission, onward)    Start     Ordered   06/19/18 0319  Urine culture  ONCE - STAT,   STAT    Question:  Patient immune status  Answer:  Immunocompromised   06/19/18 0318   06/19/18 0309  Lactic acid, plasma  Now then every 2 hours,   STAT     06/19/18 0308   06/19/18 0309  Culture, blood (Routine x 2)  BLOOD CULTURE X 2,   STAT     06/19/18 0308          Vitals/Pain Today's Vitals   06/19/18 0500 06/19/18 0508 06/19/18 0511 06/19/18 0530  BP: 101/62   (!) 99/58  Pulse: (!) 117   (!) 115  Resp: (!) 21   (!) 23  Temp:   (!) 102.7 F (39.3 C)   TempSrc:   Rectal   SpO2: 99%   98%  Weight:      Height:      PainSc:  8       Isolation Precautions Droplet and Contact precautions  Medications Medications  sodium chloride flush (NS) 0.9 % injection 3 mL (3 mLs Intravenous Not  Given 06/19/18 0345)  sodium chloride 0.9 % bolus 1,000 mL (0 mLs Intravenous Stopped 06/19/18 0445)  ceFEPIme (MAXIPIME) 2 g in sodium chloride 0.9 % 100 mL IVPB (0 g Intravenous Stopped 06/19/18 0414)  metroNIDAZOLE (FLAGYL) IVPB 500 mg (0 mg Intravenous Stopped 06/19/18 0445)  vancomycin (VANCOCIN) 2,000 mg in sodium chloride 0.9 % 500 mL IVPB (0 mg Intravenous Stopped 06/19/18 0622)  morphine 4 MG/ML injection 4 mg (4 mg Intravenous Given 06/19/18 0410)  ondansetron (ZOFRAN) injection 4 mg (4 mg Intravenous Given 06/19/18 0410)  sodium chloride 0.9 % bolus 2,000 mL (0 mLs Intravenous Stopped 06/19/18 0622)  ibuprofen (ADVIL) tablet 400 mg (400 mg Oral Given 06/19/18 0516)    Mobility walks with device High fall risk

## 2018-06-19 NOTE — Consult Note (Signed)
NAME:  Matthew Lewis, MRN:  [id number], DOB:  1955-01-12, LOS: 0 ADMISSION DATE:  06/19/2018, CONSULTATION DATE:  06/19/2018 REFERRING MD:  Dr Hal Hope, CHIEF COMPLAINT: Sepsis  Brief History   Patient was admitted to the hospital following a fall He had been feeling well prior to the fall, woke up in the middle of the night to get a glass of water, has a student up he felt he may have tripped and fell Does not recollect having any lightheadedness, no tachycardia, no fever, was not feeling ill prior to the fall According to spouse she felt he was not doing well on the day prior to the fall, not eating well He has a history of cholangiocarcinoma-stage IV, history of liver cirrhosis, history of obstructive jaundice with drain placement Past history of falls  History of present illness   Prior to the fall was not having any fever, no contact with anybody with a febrile illness No nausea, no vomiting, no diarrhea Denies a cough Denies any shortness of breath Denies any significant abdominal pain or discomfort  Past Medical History   Past Medical History:  Diagnosis Date  . Abscess of anal and rectal regions    horse shoe abscess  . Alcoholic cirrhosis ([place])   . Arthritis    "everywhere; especially in my spine"  . Cirrhosis ([place])    STAGE 1 CIRRHOSIS PATIENT SEES DR NAMDIGAM FOR  . Colon cancer ([place]) 05/2005   TX SURGERY WITH LYMPH NODE REMOVAL  . Diabetes mellitus without complication ([place])    type 2  . DM type 2 with diabetic peripheral neuropathy ([facility])    left foot  . Fatigue   . Fistula, anal    multiple  . History of substance abuse ([place])    last alcohol was 08/2011; last marijuania was 08/2011  . Hypertension    no bp meds in last year  . PTSD (post-traumatic stress disorder)    "severe" HX OF  . Sleep deprivation    Significant Hospital Events   Persistent hypotension  Consults:    Procedures:    Significant Diagnostic Tests:  CT cervical spine/head  IMPRESSION: 1. Left infraorbital facial soft tissue swelling without underlying fracture. 2. Normal CT appearance of the brain for age. No acute intracranial abnormality or trauma to the head. 3. No acute or healing facial fractures. 4. No acute trauma to the cervical spine. 5. Cervical spine fusion at C5-6, C6-7, and C7-T1. 6. Adjacent level disease at C4-5 with bilateral foraminal stenosis.  Chest x-ray IMPRESSION: 1. Cardiomegaly with mild vascular congestion. 2. Low lung volumes. 3. Stable chronic interstitial changes, asymmetric on the left.  Micro Data:  Blood cultures 6/12 Urine culture 6/12  Antimicrobials:  Vancomycin 6/11 Metronidazole 6/11 Cefepime 6/11  Interim history/subjective:  Awake and alert Complaint of back pain Denies shortness of breath  Objective   Blood pressure (!) 88/55, pulse (!) 114, temperature (!) 102.7 F (39.3 C), temperature source Rectal, resp. rate (!) 25, height 5\' 11"  (1.803 m), weight 93.9 kg, SpO2 94 %.        Intake/Output Summary (Last 24 hours) at 06/19/2018 0810 Last data filed at 06/19/2018 8099 Gross per 24 hour  Intake 3830 ml  Output -  Net 3830 ml   Filed Weights   06/19/18 0316  Weight: 93.9 kg    Examination: General: Middle-age gentleman, does not appear to be in distress, chronically ill appearing HENT: Dry oral mucosa Lungs: Decreased air entry bilaterally Cardiovascular: S1-S2 appreciated  Abdomen: Soft, full, bowel sounds appreciated, biliary drain noted Extremities: No clubbing Neuro: Alert and oriented x3, moving all extremities GU:   Resolved Hospital Problem list     Assessment & Plan:  Sepsis -Source is unclear at the present time -Follow-up on culture -Continue current antibiotics -Currently on meropenem and vancomycin -Will request for paracentesis -Continue fluid resuscitation -Continue pressors  Fall with back pain -Has chronic back problem -Will follow up radiologically   Cholangiocarcinoma -Stage IV disease -Drain in place -May need Palliative care involvement  Liver cirrhosis -Monitor -Does not appear to be acutely decompensated  Anemia -Trend CBC -Anemia of chronic disease  Hyponatremia -Trend electrolytes  Metabolic acidosis with lactic acidosis -Fluid resuscitation -Continue management for sepsis  Diabetes -Insulin with coverage  History of PTSD -Supportive measures  Remote history of polysubstance abuse   Guarded prognosis  Best practice:  Diet: N.p.o. Pain/Anxiety/Delirium protocol (if indicated): Opiates VAP protocol (if indicated): Not applicable DVT prophylaxis: SCD GI prophylaxis: Protonix Glucose control: SSI Mobility: Bedrest Code Status: Partial Disposition: ICU  Labs   CBC: Recent Labs  Lab 06/12/18 1346 06/19/18 0330  WBC 7.4 15.7*  NEUTROABS 6.0 14.0*  HGB 8.2* 8.0*  HCT 26.2* 25.4*  MCV 91.3 89.4  PLT 199 440    Basic Metabolic Panel: Recent Labs  Lab 06/12/18 1346 06/19/18 0330  NA 130* 127*  K 4.1 4.0  CL 98 98  CO2 23 18*  GLUCOSE 132* 161*  BUN 9 12  CREATININE 0.66 0.77  CALCIUM 8.0* 7.6*   GFR: Estimated Creatinine Clearance: 109.1 mL/min (by C-G formula based on SCr of 0.77 mg/dL). Recent Labs  Lab 06/12/18 1346 06/19/18 0330  WBC 7.4 15.7*  LATICACIDVEN  --  3.0*    Liver Function Tests: Recent Labs  Lab 06/12/18 1346 06/19/18 0330  AST 31 51*  ALT 15 22  ALKPHOS 313* 345*  BILITOT 1.3* 2.9*  PROT 7.1 6.6  ALBUMIN 2.0* 2.2*   No results for input(s): LIPASE, AMYLASE in the last 168 hours. No results for input(s): AMMONIA in the last 168 hours.  ABG    Component Value Date/Time   PHART 7.416 10/18/2008 2105   PCO2ART 41.6 10/18/2008 2105   PO2ART 216.0 (H) 10/18/2008 2105   HCO3 15.6 (L) 02/24/2018 0309   TCO2 16 (L) 02/24/2018 0309   ACIDBASEDEF 9.0 (H) 02/24/2018 0309   O2SAT 99.0 02/24/2018 0309     Coagulation Profile: Recent Labs  Lab 06/19/18  0330  INR 1.5*    Cardiac Enzymes: No results for input(s): CKTOTAL, CKMB, CKMBINDEX, TROPONINI in the last 168 hours.  HbA1C: Hgb A1c MFr Bld  Date/Time Value Ref Range Status  11/09/2017 07:52 AM 4.9 4.8 - 5.6 % Final    Comment:    (NOTE) Pre diabetes:          5.7%-6.4% Diabetes:              >6.4% Glycemic control for   <7.0% adults with diabetes   10/27/2017 02:49 AM 6.5 (H) 4.8 - 5.6 % Final    Comment:    (NOTE)         Prediabetes: 5.7 - 6.4         Diabetes: >6.4         Glycemic control for adults with diabetes: <7.0     CBG: No results for input(s): GLUCAP in the last 168 hours.  Review of Systems:   Review of Systems  Constitutional: Positive for fever.  HENT:  Negative.   Respiratory: Negative for cough and sputum production.   Cardiovascular: Negative for chest pain.  Gastrointestinal: Negative.  Negative for abdominal pain.  Skin: Negative.      Past Medical History  He,  has a past medical history of Abscess of anal and rectal regions, Alcoholic cirrhosis ([place]), Arthritis, Cirrhosis ([place]), Colon cancer ([place]) (05/2005), Diabetes mellitus without complication ([place]), DM type 2 with diabetic peripheral neuropathy ([place]), Fatigue, Fistula, anal, History of substance abuse ([place]), Hypertension, PTSD (post-traumatic stress disorder), and Sleep deprivation.   Surgical History    Past Surgical History:  Procedure Laterality Date  . ABSCESS DRAINAGE     "probably 15-20 so far at least; rectal"  . ANAL FISTULECTOMY  12/13/1998  . ANTERIOR FUSION CERVICAL SPINE  2010   "triple"; "did 2 surgeries on the same day in 1324 due to complications"  . CORONARY ANGIOPLASTY  2001  . detached muscle  2010   right chest; "after cervical fusion complications"  . ELBOW SURGERY  2007   "cut out part of a muscle"; left  . ESOPHAGEAL BANDING  02/25/2018   Procedure: ESOPHAGEAL BANDING;  Surgeon: Ladene Artist, MD;  Location: [facility];  Service: Endoscopy;;  .  ESOPHAGEAL BANDING  05/12/2018   Procedure: ESOPHAGEAL BANDING;  Surgeon: Gatha Mayer, MD;  Location: [facility];  Service: Endoscopy;;  . ESOPHAGEAL BANDING  05/14/2018   Procedure: ESOPHAGEAL BANDING;  Surgeon: Yetta Flock, MD;  Location: [facility];  Service: Gastroenterology;;  . ESOPHAGEAL BANDING  06/11/2018   Procedure: ESOPHAGEAL BANDING;  Surgeon: Mauri Pole, MD;  Location: [facility];  Service: Endoscopy;;  . ESOPHAGOGASTRODUODENOSCOPY N/A 05/12/2018   Procedure: ESOPHAGOGASTRODUODENOSCOPY (EGD);  Surgeon: Gatha Mayer, MD;  Location: [facility];  Service: Endoscopy;  Laterality: N/A;  . ESOPHAGOGASTRODUODENOSCOPY (EGD) WITH PROPOFOL N/A 02/25/2018   Procedure: ESOPHAGOGASTRODUODENOSCOPY (EGD) WITH PROPOFOL;  Surgeon: Ladene Artist, MD;  [facility];  Service: Endoscopy;  Laterality: N/A;  . ESOPHAGOGASTRODUODENOSCOPY (EGD) WITH PROPOFOL N/A 05/14/2018   Procedure: ESOPHAGOGASTRODUODENOSCOPY (EGD) WITH PROPOFOL;  Surgeon: Yetta Flock, MD;  Location: [facility];  Service: Gastroenterology;  Laterality: N/A;  . ESOPHAGOGASTRODUODENOSCOPY (EGD) WITH PROPOFOL N/A 06/11/2018   Procedure: ESOPHAGOGASTRODUODENOSCOPY (EGD) WITH PROPOFOL;  Surgeon: Mauri Pole, MD;  Location: [facility];  Service: Endoscopy;  Laterality: N/A;  . EXAMINATION UNDER ANESTHESIA  03/22/2005   fistula  . EXAMINATION UNDER ANESTHESIA  06/06/2011   Procedure: EXAM UNDER ANESTHESIA;  Surgeon: Stark Klein, MD;  Location: [place];  Service: General;  Laterality: N/A;  . HEMICOLECTOMY  06/06/2005   left  . INCISION AND DRAINAGE PERIRECTAL ABSCESS  03/30/2005  . INCISION AND DRAINAGE PERIRECTAL ABSCESS  12/13/2004  . INCISION AND DRAINAGE PERIRECTAL ABSCESS  07/14/2001  . INCISION AND DRAINAGE PERIRECTAL ABSCESS  08/12/2010   horseshoe abscess; Dr Redmond Pulling  . INCISION AND DRAINAGE PERIRECTAL ABSCESS  06/06/2011   Procedure: IRRIGATION AND DEBRIDEMENT PERIRECTAL ABSCESS;   Surgeon: Stark Klein, MD;  Location: [facility] [facility];  Service: General;  Laterality: N/A;  . INCISION AND DRAINAGE PERIRECTAL ABSCESS N/A 02/24/2017   Procedure: IRRIGATION AND DEBRIDEMENT PERIRECTAL ABSCESS;  Surgeon: Ralene Ok, MD;  Location: [facility];  Service: General;  Laterality: N/A;  . IR CATHETER TUBE CHANGE  02/19/2018  . IR CHOLANGIOGRAM EXISTING TUBE  12/23/2017  . IR CHOLANGIOGRAM EXISTING TUBE  01/02/2018  . IR CHOLANGIOGRAM EXISTING TUBE  05/04/2018  . IR EXCHANGE BILIARY DRAIN  11/07/2017  . IR EXCHANGE BILIARY DRAIN  12/09/2017  .  IR EXCHANGE BILIARY DRAIN  02/03/2018  . IR EXCHANGE BILIARY DRAIN  02/03/2018  . IR EXCHANGE BILIARY DRAIN  04/03/2018  . IR EXCHANGE BILIARY DRAIN  04/03/2018  . IR EXCHANGE BILIARY DRAIN  04/23/2018  . IR EXCHANGE BILIARY DRAIN  04/28/2018  . IR EXCHANGE BILIARY DRAIN  04/28/2018  . IR FLUORO RM 30-60 MIN  04/15/2018  . IR IMAGING GUIDED PORT INSERTION  12/09/2017  . IR INT EXT BILIARY DRAIN WITH CHOLANGIOGRAM  10/26/2017  . IR INT EXT BILIARY DRAIN WITH CHOLANGIOGRAM  12/23/2017  . IR PARACENTESIS  02/25/2018  . IR PARACENTESIS  05/26/2018  . IR PARACENTESIS  06/02/2018  . IR PARACENTESIS  06/08/2018  . IR PARACENTESIS  06/15/2018  . IR PATIENT EVAL TECH 0-60 MINS  12/02/2017  . IR RADIOLOGIST EVAL & MGMT  11/06/2017  . IR RADIOLOGIST EVAL & MGMT  01/05/2018  . [place]  . PERCUTANEOUS PINNING PHALANX FRACTURE OF HAND  ~ 2008   "plates in 2 places"; right  . PLACEMENT OF SETON N/A 09/05/2017   Procedure: PLACEMENT OF SETON;  Surgeon: Leighton Ruff, MD;  Location: [facility];  Service: General;  Laterality: N/A;  . RADIOLOGY WITH ANESTHESIA N/A 10/25/2017   Procedure: MRI WITH ANESTHESIA;  Surgeon: Radiologist, Medication, MD;  Location: [place];  Service: Radiology;  Laterality: N/A;  . THORACIC DISCECTOMY  [phone]     Social History   reports that he quit smoking about 27 years ago. His smoking use included cigarettes. He has  a 10.00 pack-year smoking history. He has never used smokeless tobacco. He reports previous alcohol use. He reports previous drug use. Drugs: Oxycodone and Marijuana.   Family History   His family history includes Cancer in his mother and sister; Heart disease in his brother, father, and mother.   Allergies Allergies  Allergen Reactions  . Morphine And Related Other (See Comments)    Patient is recovering from drug addiction and wants to avoid any narcotics     The patient is critically ill with multiple organ system failure and requires high complexity decision making for assessment and support, frequent evaluation and titration of therapies, advanced monitoring, review of radiographic studies and interpretation of complex data.    Critical Care Time devoted to patient care services, exclusive of separately billable procedures, described in this note is 35 minutes.  Sherrilyn Rist, MD [place], PCCM Cell: [phone]

## 2018-06-19 NOTE — ED Notes (Signed)
Patient transported to CT via stretcher.

## 2018-06-19 NOTE — Consult Note (Addendum)
Consultation  Referring Provider:  CCM/ Dr Olalere/ Primary Care Physician:  [name], MD Primary Gastroenterologist:  Dr. Silverio Decamp   Reason for Consultation: GI Bleed  HPI: Matthew Lewis is a 63 y.o. male , known to Dr. Silverio Decamp, with history of stage IV cholangiocarcinoma with complication of biliary obstruction now status post bilateral internal/external biliary drains also with history of underlying cirrhosis, now  with history of esophageal varices and prior variceal bleeding.  Last CT imaging of the abdomen in February 2020 showed thrombus in the left portal vein Was admitted earlier this morning through the emergency room after he had sustained a fall at home and hit his face.  Apparently did not syncopized.  His wife called the EMS because she says he has not been feeling well over the past couple of days, they had not been aware of any fever or chills and no nausea vomiting or diarrhea.He does relate having sharp liver pains yesterday Not aware of any melena , no vomiting. He has been undergoing weekly paracentesis with removal of 5 liters each episode  Being followed closely by Oncology, not on treatment due to progression of disease and repeated GI bleeding. He was febrile in the ER to 102, hypotensive and is now in the ICU on a critical care service requiring pressors.  Felt to be septic and on broad-spectrum antibiotic coverage.  COVID-19 screen negative Lactate 3 on admit, WBC 15, INR 1.5, creatinine 1.7T bili 2.9/alk phos 345/AST 51 Patient had an episode this afternoon of rectal bleeding with 1 fairly large grossly bloody bowel movement, there is been no hematemesis.  Hemoglobin on admit this morning was 8, follow-up down to 5.91 unit of packed RBCs has been ordered. Patient had been scheduled for abdominal CT and abdominal ultrasound for diagnostic paracentesis and is currently down for imaging.   Patient has had multiple EGDs this year he most recently had EGD on  06/11/2018 as an outpatient per Dr. Silverio Decamp with finding of grade 2 esophageal varices, 2 columns and 2 bands were placed, also noted moderate portal gastropathy.  Hemoglobin was 8.2 post procedure.   Past Medical History:  Diagnosis Date  . Abscess of anal and rectal regions    horse shoe abscess  . Alcoholic cirrhosis ([place])   . Arthritis    "everywhere; especially in my spine"  . Cirrhosis ([place])    STAGE 1 CIRRHOSIS PATIENT SEES DR NAMDIGAM FOR  . Colon cancer ([place]) 05/2005   TX SURGERY WITH LYMPH NODE REMOVAL  . Diabetes mellitus without complication ([place])    type 2  . DM type 2 with diabetic peripheral neuropathy ([facility])    left foot  . Fatigue   . Fistula, anal    multiple  . History of substance abuse ([place])    last alcohol was 08/2011; last marijuania was 08/2011  . Hypertension    no bp meds in last year  . PTSD (post-traumatic stress disorder)    "severe" HX OF  . Sleep deprivation     Past Surgical History:  Procedure Laterality Date  . ABSCESS DRAINAGE     "probably 15-20 so far at least; rectal"  . ANAL FISTULECTOMY  12/13/1998  . ANTERIOR FUSION CERVICAL SPINE  2010   "triple"; "did 2 surgeries on the same day in 1275 due to complications"  . CORONARY ANGIOPLASTY  2001  . detached muscle  2010   right chest; "after cervical fusion complications"  . ELBOW SURGERY  2007   "  cut out part of a muscle"; left  . ESOPHAGEAL BANDING  02/25/2018   Procedure: ESOPHAGEAL BANDING;  Surgeon: Ladene Artist, MD;  Location: [facility];  Service: Endoscopy;;  . ESOPHAGEAL BANDING  05/12/2018   Procedure: ESOPHAGEAL BANDING;  Surgeon: Gatha Mayer, MD;  Location: [facility];  Service: Endoscopy;;  . ESOPHAGEAL BANDING  05/14/2018   Procedure: ESOPHAGEAL BANDING;  Surgeon: Yetta Flock, MD;  Location: [facility];  Service: Gastroenterology;;  . ESOPHAGEAL BANDING  06/11/2018   Procedure: ESOPHAGEAL BANDING;  Surgeon: Mauri Pole, MD;  Location: [facility];   Service: Endoscopy;;  . ESOPHAGOGASTRODUODENOSCOPY N/A 05/12/2018   Procedure: ESOPHAGOGASTRODUODENOSCOPY (EGD);  Surgeon: Gatha Mayer, MD;  Location: [facility];  Service: Endoscopy;  Laterality: N/A;  . ESOPHAGOGASTRODUODENOSCOPY (EGD) WITH PROPOFOL N/A 02/25/2018   Procedure: ESOPHAGOGASTRODUODENOSCOPY (EGD) WITH PROPOFOL;  Surgeon: Ladene Artist, MD;  Location: [facility]COPY;  Service: Endoscopy;  Laterality: N/A;  . ESOPHAGOGASTRODUODENOSCOPY (EGD) WITH PROPOFOL N/A 05/14/2018   Procedure: ESOPHAGOGASTRODUODENOSCOPY (EGD) WITH PROPOFOL;  Surgeon: Yetta Flock, MD;  Location: [facility];  Service: Gastroenterology;  Laterality: N/A;  . ESOPHAGOGASTRODUODENOSCOPY (EGD) WITH PROPOFOL N/A 06/11/2018   Procedure: ESOPHAGOGASTRODUODENOSCOPY (EGD) WITH PROPOFOL;  Surgeon: Mauri Pole, MD;  Location: [facility];  Service: Endoscopy;  Laterality: N/A;  . EXAMINATION UNDER ANESTHESIA  03/22/2005   fistula  . EXAMINATION UNDER ANESTHESIA  06/06/2011   Procedure: EXAM UNDER ANESTHESIA;  Surgeon: Stark Klein, MD;  Location: [facility];  Service: General;  Laterality: N/A;  . HEMICOLECTOMY  06/06/2005   left  . INCISION AND DRAINAGE PERIRECTAL ABSCESS  03/30/2005  . INCISION AND DRAINAGE PERIRECTAL ABSCESS  12/13/2004  . INCISION AND DRAINAGE PERIRECTAL ABSCESS  07/14/2001  . INCISION AND DRAINAGE PERIRECTAL ABSCESS  08/12/2010   horseshoe abscess; Dr Redmond Pulling  . INCISION AND DRAINAGE PERIRECTAL ABSCESS  06/06/2011   Procedure: IRRIGATION AND DEBRIDEMENT PERIRECTAL ABSCESS;  Surgeon: Stark Klein, MD;  Location: [facility];  Service: General;  Laterality: N/A;  . INCISION AND DRAINAGE PERIRECTAL ABSCESS N/A 02/24/2017   Procedure: IRRIGATION AND DEBRIDEMENT PERIRECTAL ABSCESS;  Surgeon: Ralene Ok, MD;  Location: [facility];  Service: General;  Laterality: N/A;  . IR CATHETER TUBE CHANGE  02/19/2018  . IR CHOLANGIOGRAM EXISTING TUBE  12/23/2017  . IR CHOLANGIOGRAM EXISTING TUBE  01/02/2018  . IR  CHOLANGIOGRAM EXISTING TUBE  05/04/2018  . IR EXCHANGE BILIARY DRAIN  11/07/2017  . IR EXCHANGE BILIARY DRAIN  12/09/2017  . IR EXCHANGE BILIARY DRAIN  02/03/2018  . IR EXCHANGE BILIARY DRAIN  02/03/2018  . IR EXCHANGE BILIARY DRAIN  04/03/2018  . IR EXCHANGE BILIARY DRAIN  04/03/2018  . IR EXCHANGE BILIARY DRAIN  04/23/2018  . IR EXCHANGE BILIARY DRAIN  04/28/2018  . IR EXCHANGE BILIARY DRAIN  04/28/2018  . IR FLUORO RM 30-60 MIN  04/15/2018  . IR IMAGING GUIDED PORT INSERTION  12/09/2017  . IR INT EXT BILIARY DRAIN WITH CHOLANGIOGRAM  10/26/2017  . IR INT EXT BILIARY DRAIN WITH CHOLANGIOGRAM  12/23/2017  . IR PARACENTESIS  02/25/2018  . IR PARACENTESIS  05/26/2018  . IR PARACENTESIS  06/02/2018  . IR PARACENTESIS  06/08/2018  . IR PARACENTESIS  06/15/2018  . IR PATIENT EVAL TECH 0-60 MINS  12/02/2017  . IR RADIOLOGIST EVAL & MGMT  11/06/2017  . IR RADIOLOGIST EVAL & MGMT  01/05/2018  . [place]  . PERCUTANEOUS PINNING PHALANX FRACTURE OF HAND  ~ 2008   "plates in  2 places"; right  . PLACEMENT OF SETON N/A 09/05/2017   Procedure: PLACEMENT OF SETON;  Surgeon: Leighton Ruff, MD;  Location: [facility];  Service: General;  Laterality: N/A;  . RADIOLOGY WITH ANESTHESIA N/A 10/25/2017   Procedure: MRI WITH ANESTHESIA;  Surgeon: Radiologist, Medication, MD;  Location: [facility];  Service: Radiology;  Laterality: N/A;  . THORACIC DISCECTOMY  1990's    Prior to Admission medications   Medication Sig Start Date End Date Taking? Authorizing Provider  acetaminophen (TYLENOL) 325 MG tablet Take 650 mg by mouth every 6 (six) hours as needed for mild pain.   Yes [provider]  calcium carbonate (TUMS - DOSED IN MG ELEMENTAL CALCIUM) 500 MG chewable tablet Chew 2 tablets by mouth daily as needed for indigestion or heartburn.   Yes [provider]  celecoxib (CELEBREX) 200 MG capsule Take 200 mg by mouth daily.   Yes [provider]  DULoxetine (CYMBALTA)  30 MG capsule Take 30 mg by mouth 2 (two) times daily.   Yes [provider]  fexofenadine-pseudoephedrine (ALLEGRA-D) 60-120 MG 12 hr tablet Take 1 tablet by mouth daily.   Yes [provider]  gabapentin (NEURONTIN) 400 MG capsule Take 400-800 mg by mouth See admin instructions. Take 400 mg by mouth in the morning and 800 mg in the evening   Yes [provider]  hydroxypropyl methylcellulose / hypromellose (ISOPTO TEARS / GONIOVISC) 2.5 % ophthalmic solution 1 drop 3 (three) times daily as needed for dry eyes.   Yes [provider]  insulin detemir (LEVEMIR) 100 UNIT/ML injection Inject 10-62 Units into the skin See admin instructions. Take 62units in the am and 10units in the pm   Yes [provider]  levocetirizine (XYZAL) 5 MG tablet Take 5 mg by mouth every evening.   Yes [provider]  lidocaine (LIDODERM) 5 % Place 1 patch onto the skin daily. Remove & Discard patch within 12 hours or as directed by MD Patient taking differently: Place 1 patch onto the skin daily as needed (pain).  05/17/18  Yes Shelly Coss, MD  morphine (MSIR) 15 MG tablet Take 0.5 tablets (7.5 mg total) by mouth every 6 (six) hours as needed for severe pain. 06/12/18  Yes [name], MD  Multiple Vitamin (MULTIVITAMIN) tablet Take 1 tablet by mouth daily.   Yes [provider]  ondansetron (ZOFRAN) 8 MG tablet Take 8 mg by mouth 2 (two) times daily as needed for nausea or vomiting.   Yes [provider]  pantoprazole (PROTONIX) 40 MG tablet Take 1 tablet (40 mg total) by mouth daily at 6 (six) AM. 06/12/18  Yes [name], MD  sodium chloride 0.9 % injection Inject 10 mLs into the vein daily. 06/12/18  Yes [name], MD  tamsulosin (FLOMAX) 0.4 MG CAPS capsule TAKE 1 CAPSULE(0.4 MG) BY MOUTH DAILY AFTER SUPPER Patient taking differently: Take 0.4 mg by mouth daily after supper.  03/12/18  Yes [name], MD  diphenoxylate-atropine (LOMOTIL) 2.5-0.025 MG tablet  Take 1 tablet by mouth 4 (four) times daily as needed for diarrhea or loose stools. Patient not taking: Reported on 06/12/2018 05/26/18   Alla Feeling, NP  HYDROcodone-acetaminophen (NORCO) 5-325 MG tablet Take 1 tablet by mouth every 6 (six) hours as needed for moderate pain. Patient not taking: Reported on 06/19/2018 06/02/18   [name], MD  lidocaine-prilocaine (EMLA) cream Apply to port site 1 hour prior to access Patient taking differently: Apply 1 application  topically once. Apply to port site 1 hour prior to access 01/20/18   [name], MD  nadolol (CORGARD) 20 MG tablet Take 1 tablet (20 mg total) by mouth daily. Patient not taking: Reported on 06/19/2018 06/11/18 06/11/19  Mauri Pole, MD  sodium chloride flush (NS) 0.9 % SOLN Place 10 mLs into feeding tube as needed. Patient not taking: Reported on 06/19/2018 06/12/18   [name], MD    Current Facility-Administered Medications  Medication Dose Route Frequency Provider Last Rate Last Dose  . 0.9 %  sodium chloride infusion (Manually program via Guardrails IV Fluids)   Intravenous Once Olalere, Adewale A, MD      . 0.9 %  sodium chloride infusion   Intravenous Continuous Rise Patience, MD 100 mL/hr at 06/19/18 1433    . acetaminophen (TYLENOL) tablet 650 mg  650 mg Oral Q6H PRN Rise Patience, MD       Or  . acetaminophen (TYLENOL) suppository 650 mg  650 mg Rectal Q6H PRN Rise Patience, MD      . albumin human 25 % solution 25 g  25 g Intravenous Q6H Rise Patience, MD 60 mL/hr at 06/19/18 1433    . [START ON 06/20/2018] Chlorhexidine Gluconate Cloth 2 % PADS 6 each  6 each Topical Daily Eugenie Filler, MD      . DULoxetine (CYMBALTA) DR capsule 30 mg  30 mg Oral BID Rise Patience, MD   30 mg at 06/19/18 0933  . gabapentin (NEURONTIN) capsule 400 mg  400 mg Oral Daily Rise Patience, MD   400 mg at 06/19/18 0933  . gabapentin (NEURONTIN) capsule 800 mg  800 mg Oral QHS Eugenie Filler, MD       . insulin aspart (novoLOG) injection 0-9 Units  0-9 Units Subcutaneous TID WC Rise Patience, MD   1 Units at 06/19/18 1210  . insulin detemir (LEVEMIR) injection 10 Units  10 Units Subcutaneous QHS Eugenie Filler, MD      . insulin detemir (LEVEMIR) injection 42 Units  42 Units Subcutaneous Daily Rise Patience, MD   42 Units at 06/19/18 0933  . iohexol (OMNIPAQUE) 300 MG/ML solution 15 mL  15 mL Oral Once PRN Olalere, Adewale A, MD      . lidocaine (LIDODERM) 5 % 1 patch  1 patch Transdermal Daily PRN Rise Patience, MD      . MEDLINE mouth rinse  15 mL Mouth Rinse BID Eugenie Filler, MD   15 mL at 06/19/18 0934  . meropenem (MERREM) 1 g in sodium chloride 0.9 % 100 mL IVPB  1 g Intravenous Q8H Dorrene German, [place]   Stopped at 06/19/18 0901  . morphine (MSIR) tablet 7.5 mg  7.5 mg Oral Q6H PRN Rise Patience, MD   7.5 mg at 06/19/18 1149  . ondansetron (ZOFRAN) tablet 4 mg  4 mg Oral Q6H PRN Rise Patience, MD       Or  . ondansetron [facility]) injection 4 mg  4 mg Intravenous Q6H PRN Rise Patience, MD      . pantoprazole (PROTONIX) EC tablet 40 mg  40 mg Oral BID AC Eugenie Filler, MD   40 mg at 06/19/18 0824  . phenylephrine (NEOSYNEPHRINE) 10-0.9 MG/250ML-% infusion  0-400 mcg/min Intravenous Titrated Rise Patience, MD 165 mL/hr at 06/19/18 1433 110 mcg/min at 06/19/18 1433  . sodium chloride flush (NS) 0.9 % injection 3 mL  3 mL Intravenous Once Rise Patience, MD      . vancomycin [facility]) 1,500 mg in sodium chloride 0.9 % 500 mL IVPB  1,500 mg Intravenous Q12H Dorrene German, RPH       Facility-Administered Medications Ordered in Other Encounters  Medication Dose Route Frequency Provider Last Rate Last Dose  . 0.9 %  sodium chloride infusion   Intravenous Continuous [name], MD 20 mL/hr at 06/15/18 1444    . sodium chloride flush (NS) 0.9 % injection 10 mL  10 mL Intracatheter PRN [name], MD   10 mL at 06/15/18  1709    Allergies as of 06/19/2018 - Review Complete 06/19/2018  Allergen Reaction Noted  . Morphine and related Other (See Comments) 10/24/2017    Family History  Problem Relation Age of Onset  . Heart disease Father   . Heart disease Mother   . Cancer Mother   . Cancer Sister   . Heart disease Brother     Social History   Socioeconomic History  . Marital status: Married    Spouse name: Not on file  . Number of children: Not on file  . Years of education: Not on file  . Highest education level: Not on file  Occupational History  . Occupation: works at [employer]  . Financial resource strain: Not on file  . Food insecurity    Worry: Not on file    Inability: Not on file  . Transportation needs    Medical: Not on file    Non-medical: Not on file  Tobacco Use  . Smoking status: Former Smoker    Packs/day: 1.00    Years: 10.00    Pack years: 10.00    Types: Cigarettes    Quit date: 01/08/1991    Years since quitting: 27.4  . Smokeless tobacco: Never Used  Substance and Sexual Activity  . Alcohol use: Not Currently    Comment: 06/05/11 "drank enough when I was younger to last me my whole life; don't remember when I had my last drink, maybe 2012"  . Drug use: Not Currently    Types: Oxycodone, Marijuana    Comment: 06/05/11 "pot head in my younger days; 2009 dr had me on oxycodone and valium for almost 6yr  . Sexual activity: Not Currently  Lifestyle  . Physical activity    Days per week: Not on file    Minutes per session: Not on file  . Stress: To some extent  Relationships  . Social [place]: Not on file    Gets together: Not on file    Attends religious service: Not on file    Active member of club or organization: Not on file    Attends meetings of clubs or organizations: Not on file    Relationship status: Not on file  . Intimate partner violence    Fear of current or ex partner: Not on file    Emotionally abused:  Not on file    Physically abused: Not on file    Forced sexual activity: Not on file  Other Topics Concern  . Not on file  Social History Narrative  . Not on file    Review of Systems: Pertinent positive and negative review of systems were noted in the above HPI section.  All other review of systems was otherwise negative.  Physical Exam: Vital signs in last 24 hours: Temp:  [98.2 F (36.8 C)-103.1 F (39.5  C)] 98.2 F (36.8 C) (06/12 1200) Pulse Rate:  [96-136] 96 (06/12 1500) Resp:  [16-35] 18 (06/12 1500) BP: (75-121)/(40-94) 100/64 (06/12 1500) SpO2:  [90 %-100 %] 100 % (06/12 1500) Weight:  [93.9 kg] 93.9 kg (06/12 0316)   General:   Alert,  Well-developed, acute and chronically ill-appearing white male Head:  Normocephalic and atraumatic. Eyes:  Sclera early icterus   conjunctiva pink. Ears:  Normal auditory acuity. Nose:  No deformity, discharge,  or lesions. Mouth:  No deformity or lesions.   Neck:  Supple; no masses or thyromegaly. Lungs:  Clear throughout to auscultation.   No wheezes, crackles, or rhonchi. Heart:  Regular rate and rhythm; no murmurs, clicks, rubs,  or gallops. Abdomen:  Soft,nontender, BS active,nonpalp mass or hsm.   Rectal:  Deferred  Msk:  Symmetrical without gross deformities. . Pulses:  Normal pulses noted. Extremities:  Without clubbing or edema. Neurologic:  Alert and  oriented x4;  grossly normal neurologically. Skin:  Intact without significant lesions or rashes.. Psych:  Alert and cooperative. Normal mood and affect.  Intake/Output from previous day: 06/11 0701 - 06/12 0700 In: 3830 [IV Piggyback:3830] Out: -  Intake/Output this shift: Total I/O In: 2737.5 [I.V.:2559.5; IV Piggyback:178] Out: 2000 [Urine:1800; Drains:200]  Lab Results: Recent Labs    06/19/18 0330 06/19/18 1145  WBC 15.7* 11.8*  HGB 8.0* 5.9*  HCT 25.4* 19.1*  PLT 228 157   BMET Recent Labs    06/19/18 0330 06/19/18 1145  NA 127* 130*  K 4.0 4.2   CL 98 105  CO2 18* 21*  GLUCOSE 161* 145*  BUN 12 13  CREATININE 0.77 0.76  CALCIUM 7.6* 7.2*   LFT Recent Labs    06/19/18 1145  PROT 5.9*  ALBUMIN 2.2*  AST 46*  ALT 22  ALKPHOS 257*  BILITOT 3.2*   PT/INR Recent Labs    06/19/18 0330 06/19/18 1145  LABPROT 17.7* 19.4*  INR 1.5* 1.7*   Hepatitis Panel No results for input(s): HEPBSAG, HCVAB, HEPAIGM, HEPBIGM in the last 72 hours.       IMPRESSION:  #62 63 year old white male with stage IV cholangiocarcinoma, status post bilateral internal and external biliary drains for biliary obstruction and previously documented left portal vein thrombus, admitted earlier today after a fall at home.  Apparently had not been feeling well over the past couple of days.  In the ER he was febrile and hypotensive and met criteria for sepsis He is requiring pressors, currently on neo- Etiology of sepsis not clear consider biliary, SBP is being ruled out with diagnostic paracentesis this afternoon is also undergoing CT of the abdomen and pelvis  Onset this afternoon of GI bleeding with 1 large bloody bowel movement.  Has also had 2 g drop in hemoglobin. Patient has previously documented esophageal varices and has been undergoing serial banding.  His last EGD was about 1 week ago at which time he had 2 bands placed over 2 columns of grade 2 varices.  High suspicion for recurrent variceal bleeding and/or ulceration at prior banding site  #2 profound anemia acute on chronic #3 adult onset diabetes mellitus  PLAN: N.p.o. Start IV Protonix infusion Start octreotide infusion Patient is being transfused 2 unit of packed RBCs, two 6-hour hemoglobins and transfuse to keep hemoglobin 7 Pt will need Endoscopy this evening, and will require intubation pre-procedure( with plan to extubate post procedure).  This has been discussed with critical care , and they will be available.  We will  discuss timing of the EGD with Dr. Juanita Craver who is on-call  this evening. Procedure planned for 8 pm - after gets started on Octreotide /Protonix and has been transfused.  Follow up on cells counts etc for tap today  Thank you, we will follow with you  CT has resulted stat-shows a right lower lobe pneumonia or atelectasis, multiple low densities are noted throughout the liver which likely represent metastatic disease, 2 right external biliary drains, no evidence of bowel obstruction, there is mild wall thickening in the right and transverse colon may represent inflammatory colitis, or potentially may be secondary to ascites.  Stable periaortic adenopathy   Amy EsterwoodPA-C  06/19/2018, 3:20 PM

## 2018-06-19 NOTE — Progress Notes (Signed)
EGD performed at bedside in ICU. ICU RN at bedside throughout. No moderate sedation given.

## 2018-06-19 NOTE — Progress Notes (Signed)
Patient intubated for airway protection  Planned EGD today  Currently being transfused  Tolerated intubation well Chest x-ray pending

## 2018-06-19 NOTE — Progress Notes (Signed)
CRITICAL VALUE ALERT  Critical Value: Hgb 5.9   Date & Time Notied: 1220 and 6/12  Provider Notified: MD Hermina Staggers   Orders Received/Actions taken: Type and Screen order placement and Transfuse 1 unit of PRBCs. Will place verbal order and pass this information to [place].

## 2018-06-19 NOTE — Procedures (Signed)
Central Venous Catheter Insertion Procedure Note Matthew Lewis [id number] [birth year]  Procedure: Insertion of Central Venous Catheter Indications: Drug and/or fluid administration  Procedure Details Consent: Risks of procedure as well as the alternatives and risks of each were explained to the (patient/caregiver).  Consent for procedure obtained. Time Out: Verified patient identification, verified procedure, site/side was marked, verified correct patient position, special equipment/implants available, medications/allergies/relevent history reviewed, required imaging and test results available.  Performed  Maximum sterile technique was used including antiseptics, cap, gloves, gown, hand hygiene, mask and sheet. Skin prep: Chlorhexidine; local anesthetic administered An attempt was made at the left internal jugular site Following adequate visualization by ultrasound, vein was accessed Guidewire was placed easily And attempt to advance the dilator over the guidewire was unsuccessful Skin appears to be thick and scarred around access site The attempt was abandoned, site was compressed, hemostasis achieved   A second attempt was made in the right femoral Following maximum sterile technique including antiseptics,, gloves, gown, and hygiene, mask and sheet Skin was prepared with chlorhexidine, local anesthetic administered Right femoral vein was accessed under visualization with an ultrasound Guidewire placed A antimicrobial bonded/coated triple lumen catheter was placed in the right femoral vein using the Seldinger technique.  Evaluation Blood flow good Complications: No apparent complications Patient did tolerate procedure well. Chest X-ray ordered to verify placement.  CXR: pending.-X-ray being performed to rule out pneumothorax from attempts at the left IJ  Matthew Lewis 06/19/2018, 7:05 PM

## 2018-06-19 NOTE — ED Triage Notes (Signed)
Arrives via EMS from home. C/C fall that occurred at 0200. Patient was walking to the kitchen to get some water and does not remember how he fell and he does remember falling. Patient seems to have fallen face forward and has two busted lips. New onset fever, has been feeling unwell today. Patient also complaining of back pain, c-collar upon arrival.

## 2018-06-19 NOTE — Procedures (Signed)
Ultrasound-guided diagnostic paracentesis performed yielding 1 liter (maximum ordered)  of clear, yellow fluid. No immediate complications. A portion of the fluid was sent to the lab for preordered studies. EBL < 1 cc.

## 2018-06-19 NOTE — H&P (Signed)
History and Physical    Matthew Lewis ZOX:[id number] DOB: [birth year] DOA: 06/19/2018  PCP: [name], MD  Patient coming from: Home.  Chief Complaint: Fall.  HPI: Matthew Lewis is a 63 y.o. male with history of cholangiocarcinoma stage IV, cirrhosis of the liver, GI bleed, obstructive jaundice status post drain placement, diabetes mellitus was brought to the ER after patient had a fall at home when he was trying to walk early in the morning.  Patient states he suddenly fell and hit his face did not lose consciousness.  Patient's wife came and immediately checked on him.  EMS was called and patient was brought to the ER.  Per patient's wife patient has not been doing well since yesterday morning and has not been eating well.  Has benign exam abdominal discomfort more than usual yesterday.  Did not have any nausea vomiting diarrhea chest pain shortness of breath or productive cough.  ED Course: In the ER CT head maxillofacial and CT C-spine did not show any acute trauma.  X-ray of the chest did not show anything acute except for some vascular congestion and cardiomegaly.  EKG shows sinus tachycardia.  Lactate was elevated at 3 WBC count was 15 hemoglobin 8 platelets 228 albumin was 2.2 sodium 127 blood glucose 161.  Creatinine 1.7.  Patient was started on empiric antibiotics for sepsis of unknown source after blood cultures were obtained.  COVID-19 was negative.  On exam patient has slightly distended abdomen nontender but complains of significant pain in the lower back after the fall.  Review of Systems: As per HPI, rest all negative.   Past Medical History:  Diagnosis Date   Abscess of anal and rectal regions    horse shoe abscess   Alcoholic cirrhosis ([place])    Arthritis    "everywhere; especially in my spine"   Cirrhosis ([place])    STAGE 1 CIRRHOSIS PATIENT SEES DR NAMDIGAM FOR   Colon cancer ([place]) 05/2005   TX SURGERY WITH LYMPH NODE REMOVAL   Diabetes mellitus  without complication ([place])    type 2   DM type 2 with diabetic peripheral neuropathy ([place])    left foot   Fatigue    Fistula, anal    multiple   History of substance abuse ([place])    last alcohol was 08/2011; last marijuania was 08/2011   Hypertension    no bp meds in last year   PTSD (post-traumatic stress disorder)    "severe" HX OF   Sleep deprivation     Past Surgical History:  Procedure Laterality Date   ABSCESS DRAINAGE     "probably 15-20 so far at least; rectal"   ANAL FISTULECTOMY  12/13/1998   ANTERIOR FUSION CERVICAL SPINE  2010   "triple"; "did 2 surgeries on the same day in 8119 due to complications"   CORONARY ANGIOPLASTY  2001   detached muscle  2010   right chest; "after cervical fusion complications"   ELBOW SURGERY  2007   "cut out part of a muscle"; left   ESOPHAGEAL BANDING  02/25/2018   Procedure: ESOPHAGEAL BANDING;  Surgeon: Ladene Artist, MD;  Location: [facility];  Service: Endoscopy;;   ESOPHAGEAL BANDING  05/12/2018   Procedure: ESOPHAGEAL BANDING;  Surgeon: Gatha Mayer, MD;  Location: [facility];  Service: Endoscopy;;   ESOPHAGEAL BANDING  05/14/2018   Procedure: ESOPHAGEAL BANDING;  Surgeon: Yetta Flock, MD;  Location: [facility];  Service: Gastroenterology;;   ESOPHAGEAL BANDING  06/11/2018  Procedure: ESOPHAGEAL BANDING;  Surgeon: Mauri Pole, MD;  Location: [facility];  Service: Endoscopy;;   ESOPHAGOGASTRODUODENOSCOPY N/A 05/12/2018   Procedure: ESOPHAGOGASTRODUODENOSCOPY (EGD);  Surgeon: Gatha Mayer, MD;  Location: [facility];  Service: Endoscopy;  Laterality: N/A;   ESOPHAGOGASTRODUODENOSCOPY (EGD) WITH PROPOFOL N/A 02/25/2018   Procedure: ESOPHAGOGASTRODUODENOSCOPY (EGD) WITH PROPOFOL;  Surgeon: Ladene Artist, MD;  Location[facility];  Service: Endoscopy;  Laterality: N/A;   ESOPHAGOGASTRODUODENOSCOPY (EGD) WITH PROPOFOL N/A 05/14/2018   Procedure: ESOPHAGOGASTRODUODENOSCOPY (EGD) WITH  PROPOFOL;  Surgeon: Yetta Flock, MD;  [facility];  Service: Gastroenterology;  Laterality: N/A;   ESOPHAGOGASTRODUODENOSCOPY (EGD) WITH PROPOFOL N/A 06/11/2018   Procedure: ESOPHAGOGASTRODUODENOSCOPY (EGD) WITH PROPOFOL;  Surgeon: Mauri Pole, MD;  Location: [facility];  Service: Endoscopy;  Laterality: N/A;   EXAMINATION UNDER ANESTHESIA  03/22/2005   fistula   EXAMINATION UNDER ANESTHESIA  06/06/2011   Procedure: EXAM UNDER ANESTHESIA;  Surgeon: Stark Klein, MD;  Location: [facility];  Service: General;  Laterality: N/A;   HEMICOLECTOMY  06/06/2005   left   INCISION AND DRAINAGE PERIRECTAL ABSCESS  03/30/2005   INCISION AND DRAINAGE PERIRECTAL ABSCESS  12/13/2004   INCISION AND DRAINAGE PERIRECTAL ABSCESS  07/14/2001   INCISION AND DRAINAGE PERIRECTAL ABSCESS  08/12/2010   horseshoe abscess; Dr Redmond Pulling   INCISION AND DRAINAGE PERIRECTAL ABSCESS  06/06/2011   Procedure: IRRIGATION AND DEBRIDEMENT PERIRECTAL ABSCESS;  Surgeon: Stark Klein, MD;  Location: [facility];  Service: General;  Laterality: N/A;   INCISION AND DRAINAGE PERIRECTAL ABSCESS N/A 02/24/2017   Procedure: IRRIGATION AND DEBRIDEMENT PERIRECTAL ABSCESS;  Surgeon: Ralene Ok, MD;  Location: [facility];  Service: General;  Laterality: N/A;   IR CATHETER TUBE CHANGE  02/19/2018   IR CHOLANGIOGRAM EXISTING TUBE  12/23/2017   IR CHOLANGIOGRAM EXISTING TUBE  01/02/2018   IR CHOLANGIOGRAM EXISTING TUBE  05/04/2018   IR EXCHANGE BILIARY DRAIN  11/07/2017   IR EXCHANGE BILIARY DRAIN  12/09/2017   IR EXCHANGE BILIARY DRAIN  02/03/2018   IR EXCHANGE BILIARY DRAIN  02/03/2018   IR EXCHANGE BILIARY DRAIN  04/03/2018   IR EXCHANGE BILIARY DRAIN  04/03/2018   IR EXCHANGE BILIARY DRAIN  04/23/2018   IR EXCHANGE BILIARY DRAIN  04/28/2018   IR EXCHANGE BILIARY DRAIN  04/28/2018   IR FLUORO RM 30-60 MIN  04/15/2018   IR IMAGING GUIDED PORT INSERTION  12/09/2017   IR INT EXT BILIARY DRAIN WITH CHOLANGIOGRAM   10/26/2017   IR INT EXT BILIARY DRAIN WITH CHOLANGIOGRAM  12/23/2017   IR PARACENTESIS  02/25/2018   IR PARACENTESIS  05/26/2018   IR PARACENTESIS  06/02/2018   IR PARACENTESIS  06/08/2018   IR PARACENTESIS  06/15/2018   IR PATIENT EVAL TECH 0-60 MINS  12/02/2017   IR RADIOLOGIST EVAL & MGMT  11/06/2017   IR RADIOLOGIST EVAL & MGMT  01/05/2018   LUMBAR DISC SURGERY  1987   PERCUTANEOUS PINNING PHALANX FRACTURE OF HAND  ~ 2008   "plates in 2 places"; right   PLACEMENT OF SETON N/A 09/05/2017   Procedure: PLACEMENT OF SETON;  Surgeon: Leighton Ruff, MD;  Location: [facility];  Service: General;  Laterality: N/A;   RADIOLOGY WITH ANESTHESIA N/A 10/25/2017   Procedure: MRI WITH ANESTHESIA;  Surgeon: Radiologist, Medication, MD;  Location: [facility];  Service: Radiology;  Laterality: N/A;   THORACIC DISCECTOMY  1990's     reports that he quit smoking about 27 years ago. His smoking use included cigarettes. He has  a 10.00 pack-year smoking history. He has never used smokeless tobacco. He reports previous alcohol use. He reports previous drug use. Drugs: Oxycodone and Marijuana.  Allergies  Allergen Reactions   Morphine And Related Other (See Comments)    Patient is recovering from drug addiction and wants to avoid any narcotics    Family History  Problem Relation Age of Onset   Heart disease Father    Heart disease Mother    Cancer Mother    Cancer Sister    Heart disease Brother     Prior to Admission medications   Medication Sig Start Date End Date Taking? Authorizing Provider  acetaminophen (TYLENOL) 325 MG tablet Take 650 mg by mouth every 6 (six) hours as needed for mild pain.   Yes [provider]  calcium carbonate (TUMS - DOSED IN MG ELEMENTAL CALCIUM) 500 MG chewable tablet Chew 2 tablets by mouth daily as needed for indigestion or heartburn.   Yes [provider]  celecoxib (CELEBREX) 200 MG capsule Take 200 mg by mouth daily.    Yes [provider]  DULoxetine (CYMBALTA) 30 MG capsule Take 30 mg by mouth 2 (two) times daily.   Yes [provider]  fexofenadine-pseudoephedrine (ALLEGRA-D) 60-120 MG 12 hr tablet Take 1 tablet by mouth daily.   Yes [provider]  gabapentin (NEURONTIN) 400 MG capsule Take 400-800 mg by mouth See admin instructions. Take 400 mg by mouth in the morning and 800 mg in the evening   Yes [provider]  hydroxypropyl methylcellulose / hypromellose (ISOPTO TEARS / GONIOVISC) 2.5 % ophthalmic solution 1 drop 3 (three) times daily as needed for dry eyes.   Yes [provider]  insulin detemir (LEVEMIR) 100 UNIT/ML injection Inject 10-62 Units into the skin See admin instructions. Take 62units in the am and 10units in the pm   Yes [provider]  levocetirizine (XYZAL) 5 MG tablet Take 5 mg by mouth every evening.   Yes [provider]  lidocaine (LIDODERM) 5 % Place 1 patch onto the skin daily. Remove & Discard patch within 12 hours or as directed by MD Patient taking differently: Place 1 patch onto the skin daily as needed (pain).  05/17/18  Yes Shelly Coss, MD  morphine (MSIR) 15 MG tablet Take 0.5 tablets (7.5 mg total) by mouth every 6 (six) hours as needed for severe pain. 06/12/18  Yes [name], MD  Multiple Vitamin (MULTIVITAMIN) tablet Take 1 tablet by mouth daily.   Yes [provider]  ondansetron (ZOFRAN) 8 MG tablet Take 8 mg by mouth 2 (two) times daily as needed for nausea or vomiting.   Yes [provider]  pantoprazole (PROTONIX) 40 MG tablet Take 1 tablet (40 mg total) by mouth daily at 6 (six) AM. 06/12/18  Yes [name], MD  sodium chloride 0.9 % injection Inject 10 mLs into the vein daily. 06/12/18  Yes [name], MD  tamsulosin (FLOMAX) 0.4 MG CAPS capsule TAKE 1 CAPSULE(0.4 MG) BY MOUTH DAILY AFTER SUPPER Patient taking differently: Take 0.4 mg by mouth daily after supper.  03/12/18  Yes [name], MD    diphenoxylate-atropine (LOMOTIL) 2.5-0.025 MG tablet Take 1 tablet by mouth 4 (four) times daily as needed for diarrhea or loose stools. Patient not taking: Reported on 06/12/2018 05/26/18   Alla Feeling, NP  HYDROcodone-acetaminophen (NORCO) 5-325 MG tablet Take 1 tablet by mouth every 6 (six) hours as needed for moderate pain. Patient not taking: Reported on  06/19/2018 06/02/18   [name], MD  lidocaine-prilocaine (EMLA) cream Apply to port site 1 hour prior to access Patient taking differently: Apply 1 application topically once. Apply to port site 1 hour prior to access 01/20/18   [name], MD  nadolol (CORGARD) 20 MG tablet Take 1 tablet (20 mg total) by mouth daily. Patient not taking: Reported on 06/19/2018 06/11/18 06/11/19  Mauri Pole, MD  sodium chloride flush (NS) 0.9 % SOLN Place 10 mLs into feeding tube as needed. Patient not taking: Reported on 06/19/2018 06/12/18   [name], MD    Physical Exam: Vitals:   06/19/18 0500 06/19/18 0511 06/19/18 0530 06/19/18 0621  BP: 101/62  (!) 99/58 (!) 88/55  Pulse: (!) 117  (!) 115 (!) 114  Resp: (!) 21  (!) 23 (!) 25  Temp:  (!) 102.7 F (39.3 C)    TempSrc:  Rectal    SpO2: 99%  98% 94%  Weight:      Height:          Constitutional: Moderately built and nourished. Vitals:   06/19/18 0500 06/19/18 0511 06/19/18 0530 06/19/18 0621  BP: 101/62  (!) 99/58 (!) 88/55  Pulse: (!) 117  (!) 115 (!) 114  Resp: (!) 21  (!) 23 (!) 25  Temp:  (!) 102.7 F (39.3 C)    TempSrc:  Rectal    SpO2: 99%  98% 94%  Weight:      Height:       Eyes: Anicteric no pallor. ENMT: No discharge from the ears eyes nose and mouth. Neck: No mass felt.  No neck rigidity. Respiratory: No rhonchi or crepitations. Cardiovascular: S1-S2 heard. Abdomen: Drain seen.  Soft slightly distended nontender. Musculoskeletal: Mild edema to both lower extremities. Skin: Chronic skin changes. Neurologic: Alert awake oriented to time place and person.  Moves all  extremities. Psychiatric: Appears normal.  Normal affect.   Labs on Admission: I have personally reviewed following labs and imaging studies  CBC: Recent Labs  Lab 06/12/18 1346 06/19/18 0330  WBC 7.4 15.7*  NEUTROABS 6.0 14.0*  HGB 8.2* 8.0*  HCT 26.2* 25.4*  MCV 91.3 89.4  PLT 199 378   Basic Metabolic Panel: Recent Labs  Lab 06/12/18 1346 06/19/18 0330  NA 130* 127*  K 4.1 4.0  CL 98 98  CO2 23 18*  GLUCOSE 132* 161*  BUN 9 12  CREATININE 0.66 0.77  CALCIUM 8.0* 7.6*   GFR: Estimated Creatinine Clearance: 109.1 mL/min (by C-G formula based on SCr of 0.77 mg/dL). Liver Function Tests: Recent Labs  Lab 06/12/18 1346 06/19/18 0330  AST 31 51*  ALT 15 22  ALKPHOS 313* 345*  BILITOT 1.3* 2.9*  PROT 7.1 6.6  ALBUMIN 2.0* 2.2*   No results for input(s): LIPASE, AMYLASE in the last 168 hours. No results for input(s): AMMONIA in the last 168 hours. Coagulation Profile: Recent Labs  Lab 06/19/18 0330  INR 1.5*   Cardiac Enzymes: No results for input(s): CKTOTAL, CKMB, CKMBINDEX, TROPONINI in the last 168 hours. BNP (last 3 results) No results for input(s): PROBNP in the last 8760 hours. HbA1C: No results for input(s): HGBA1C in the last 72 hours. CBG: No results for input(s): GLUCAP in the last 168 hours. Lipid Profile: No results for input(s): CHOL, HDL, LDLCALC, TRIG, CHOLHDL, LDLDIRECT in the last 72 hours. Thyroid Function Tests: No results for input(s): TSH, T4TOTAL, FREET4, T3FREE, THYROIDAB in the last 72 hours. Anemia Panel: No results for input(s): VITAMINB12,  FOLATE, FERRITIN, TIBC, IRON, RETICCTPCT in the last 72 hours. Urine analysis:    Component Value Date/Time   COLORURINE AMBER (A) 06/19/2018 0440   APPEARANCEUR HAZY (A) 06/19/2018 0440   LABSPEC 1.015 06/19/2018 0440   PHURINE 5.0 06/19/2018 0440   GLUCOSEU NEGATIVE 06/19/2018 0440   HGBUR NEGATIVE 06/19/2018 0440   BILIRUBINUR NEGATIVE 06/19/2018 0440   KETONESUR NEGATIVE  06/19/2018 0440   PROTEINUR NEGATIVE 06/19/2018 0440   UROBILINOGEN 0.2 08/15/2011 1816   NITRITE NEGATIVE 06/19/2018 0440   LEUKOCYTESUR NEGATIVE 06/19/2018 0440   Sepsis Labs: @LABRCNTIP (procalcitonin:4,lacticidven:4) ) Recent Results (from the past 240 hour(s))  SARS Coronavirus 2 (CEPHEID- Performed in [place] hospital lab), Hosp Order     Status: None   Collection Time: 06/19/18  3:30 AM   Specimen: Nasopharyngeal Swab  Result Value Ref Range Status   SARS Coronavirus 2 NEGATIVE NEGATIVE Final    Comment: (NOTE) If result is NEGATIVE SARS-CoV-2 target nucleic acids are NOT DETECTED. The SARS-CoV-2 RNA is generally detectable in upper and lower  respiratory specimens during the acute phase of infection. The lowest  concentration of SARS-CoV-2 viral copies this assay can detect is 250  copies / mL. A negative result does not preclude SARS-CoV-2 infection  and should not be used as the sole basis for treatment or other  patient management decisions.  A negative result may occur with  improper specimen collection / handling, submission of specimen other  than nasopharyngeal swab, presence of viral mutation(s) within the  areas targeted by this assay, and inadequate number of viral copies  (<250 copies / mL). A negative result must be combined with clinical  observations, patient history, and epidemiological information. If result is POSITIVE SARS-CoV-2 target nucleic acids are DETECTED. The SARS-CoV-2 RNA is generally detectable in upper and lower  respiratory specimens dur ing the acute phase of infection.  Positive  results are indicative of active infection with SARS-CoV-2.  Clinical  correlation with patient history and other diagnostic information is  necessary to determine patient infection status.  Positive results do  not rule out bacterial infection or co-infection with other viruses. If result is PRESUMPTIVE POSTIVE SARS-CoV-2 nucleic acids MAY BE PRESENT.   A  presumptive positive result was obtained on the submitted specimen  and confirmed on repeat testing.  While 2019 novel coronavirus  (SARS-CoV-2) nucleic acids may be present in the submitted sample  additional confirmatory testing may be necessary for epidemiological  and / or clinical management purposes  to differentiate between  SARS-CoV-2 and other Sarbecovirus currently known to infect humans.  If clinically indicated additional testing with an alternate test  methodology [phone]) is advised. The SARS-CoV-2 RNA is generally  detectable in upper and lower respiratory sp ecimens during the acute  phase of infection. The expected result is Negative. Fact Sheet for Patients:  StrictlyIdeas.no Fact Sheet for Healthcare Providers: BankingDealers.co.za This test is not yet approved or cleared by the [place] FDA and has been authorized for detection and/or diagnosis of SARS-CoV-2 by FDA under an Emergency Use Authorization (EUA).  This EUA will remain in effect (meaning this test can be used) for the duration of the COVID-19 declaration under Section 564(b)(1) of the Act, 21 U.S.C. section 360bbb-3(b)(1), unless the authorization is terminated or revoked sooner. Performed at [facility], [place] [address], [place], [place] [address]      Radiological Exams on Admission: Ct Head Wo Contrast  Result Date: 06/19/2018 CLINICAL DATA:  Head trauma, minor. Fall.  Cholangiocarcinoma with known metastatic disease. Initial encounter. EXAM: CT HEAD WITHOUT CONTRAST CT MAXILLOFACIAL WITHOUT CONTRAST CT CERVICAL SPINE WITHOUT CONTRAST TECHNIQUE: Multidetector CT imaging of the head, cervical spine, and maxillofacial structures were performed using the standard protocol without intravenous contrast. Multiplanar CT image reconstructions of the cervical spine and maxillofacial structures were also generated. COMPARISON:  CT of the head  and cervical spine 05/12/2018. MRI brain 02/26/2018 FINDINGS: CT HEAD FINDINGS Brain: No acute infarct, hemorrhage, or mass lesion is present. No significant white matter lesions are present. The ventricles are of normal size. No significant extraaxial fluid collection is present. The brainstem and cerebellum are within normal limits. Vascular: Atherosclerotic calcifications are present in the cavernous right internal carotid artery. There is no hyperdense vessel. Skull: Calvarium is intact. No focal lytic or blastic lesions are present. CT MAXILLOFACIAL FINDINGS Osseous: No acute or healing facial fractures are present. Nasal bones are intact. Zygomatic arch is normal bilaterally. Orbits: The globes and orbits are within normal limits. Sinuses: The paranasal sinuses and mastoid air cells are clear. Soft tissues: Left infraorbital soft tissue swelling and hematoma is present. There is no significant laceration. No foreign body is present. No other significant soft tissue injury is present. CT CERVICAL SPINE FINDINGS Alignment: There straightening of the normal cervical lordosis. Slight kyphosis is present at C4-5. AP alignment is anatomic. Skull base and vertebrae: The craniocervical junction is normal. Vertebral body heights are maintained. No acute or healing fractures are present. Soft tissues and spinal canal: No prevertebral fluid or swelling. No visible canal hematoma. Vascular calcifications are present at the carotid bifurcations, right greater than left. Disc levels: Solid fusion is present C5-6, C6-7, and C7-T1. Adjacent level disease is present at C4-5 with foraminal narrowing bilaterally due to uncovertebral disease. Right foraminal narrowing is present at C3-4 greater than C2-3 due to uncovertebral and facet disease. Upper chest: The lung apices are clear. Right IJ Port-A-Cath is in place. Thoracic inlet is otherwise normal. IMPRESSION: 1. Left infraorbital facial soft tissue swelling without underlying  fracture. 2. Normal CT appearance of the brain for age. No acute intracranial abnormality or trauma to the head. 3. No acute or healing facial fractures. 4. No acute trauma to the cervical spine. 5. Cervical spine fusion at C5-6, C6-7, and C7-T1. 6. Adjacent level disease at C4-5 with bilateral foraminal stenosis. Electronically Signed   By: San Morelle M.D.   On: 06/19/2018 06:15   Ct Cervical Spine Wo Contrast  Result Date: 06/19/2018 CLINICAL DATA:  Head trauma, minor. Fall. Cholangiocarcinoma with known metastatic disease. Initial encounter. EXAM: CT HEAD WITHOUT CONTRAST CT MAXILLOFACIAL WITHOUT CONTRAST CT CERVICAL SPINE WITHOUT CONTRAST TECHNIQUE: Multidetector CT imaging of the head, cervical spine, and maxillofacial structures were performed using the standard protocol without intravenous contrast. Multiplanar CT image reconstructions of the cervical spine and maxillofacial structures were also generated. COMPARISON:  CT of the head and cervical spine 05/12/2018. MRI brain 02/26/2018 FINDINGS: CT HEAD FINDINGS Brain: No acute infarct, hemorrhage, or mass lesion is present. No significant white matter lesions are present. The ventricles are of normal size. No significant extraaxial fluid collection is present. The brainstem and cerebellum are within normal limits. Vascular: Atherosclerotic calcifications are present in the cavernous right internal carotid artery. There is no hyperdense vessel. Skull: Calvarium is intact. No focal lytic or blastic lesions are present. CT MAXILLOFACIAL FINDINGS Osseous: No acute or healing facial fractures are present. Nasal bones are intact. Zygomatic arch is normal bilaterally. Orbits: The globes and orbits  are within normal limits. Sinuses: The paranasal sinuses and mastoid air cells are clear. Soft tissues: Left infraorbital soft tissue swelling and hematoma is present. There is no significant laceration. No foreign body is present. No other significant soft  tissue injury is present. CT CERVICAL SPINE FINDINGS Alignment: There straightening of the normal cervical lordosis. Slight kyphosis is present at C4-5. AP alignment is anatomic. Skull base and vertebrae: The craniocervical junction is normal. Vertebral body heights are maintained. No acute or healing fractures are present. Soft tissues and spinal canal: No prevertebral fluid or swelling. No visible canal hematoma. Vascular calcifications are present at the carotid bifurcations, right greater than left. Disc levels: Solid fusion is present C5-6, C6-7, and C7-T1. Adjacent level disease is present at C4-5 with foraminal narrowing bilaterally due to uncovertebral disease. Right foraminal narrowing is present at C3-4 greater than C2-3 due to uncovertebral and facet disease. Upper chest: The lung apices are clear. Right IJ Port-A-Cath is in place. Thoracic inlet is otherwise normal. IMPRESSION: 1. Left infraorbital facial soft tissue swelling without underlying fracture. 2. Normal CT appearance of the brain for age. No acute intracranial abnormality or trauma to the head. 3. No acute or healing facial fractures. 4. No acute trauma to the cervical spine. 5. Cervical spine fusion at C5-6, C6-7, and C7-T1. 6. Adjacent level disease at C4-5 with bilateral foraminal stenosis. Electronically Signed   By: San Morelle M.D.   On: 06/19/2018 06:15   Dg Chest Port 1 View  Result Date: 06/19/2018 CLINICAL DATA:  Fall. Sepsis. Cholangiocarcinoma with metastatic disease. EXAM: PORTABLE CHEST 1 VIEW COMPARISON:  One-view chest x-ray 05/12/2018 CT of the chest 02/19/2018 FINDINGS: Heart size is normal. Right IJ Port-A-Cath is stable. The tip is in the right atrium. Asymmetric interstitial pattern is again noted on the left. Left basilar metastasis is not well seen. Lung volumes are low. Mild pulmonary vascular congestion is present without frank edema. IMPRESSION: 1. Cardiomegaly with mild vascular congestion. 2. Low lung  volumes. 3. Stable chronic interstitial changes, asymmetric on the left. Electronically Signed   By: San Morelle M.D.   On: 06/19/2018 04:27   Ct Maxillofacial Wo Contrast  Result Date: 06/19/2018 CLINICAL DATA:  Head trauma, minor. Fall. Cholangiocarcinoma with known metastatic disease. Initial encounter. EXAM: CT HEAD WITHOUT CONTRAST CT MAXILLOFACIAL WITHOUT CONTRAST CT CERVICAL SPINE WITHOUT CONTRAST TECHNIQUE: Multidetector CT imaging of the head, cervical spine, and maxillofacial structures were performed using the standard protocol without intravenous contrast. Multiplanar CT image reconstructions of the cervical spine and maxillofacial structures were also generated. COMPARISON:  CT of the head and cervical spine 05/12/2018. MRI brain 02/26/2018 FINDINGS: CT HEAD FINDINGS Brain: No acute infarct, hemorrhage, or mass lesion is present. No significant white matter lesions are present. The ventricles are of normal size. No significant extraaxial fluid collection is present. The brainstem and cerebellum are within normal limits. Vascular: Atherosclerotic calcifications are present in the cavernous right internal carotid artery. There is no hyperdense vessel. Skull: Calvarium is intact. No focal lytic or blastic lesions are present. CT MAXILLOFACIAL FINDINGS Osseous: No acute or healing facial fractures are present. Nasal bones are intact. Zygomatic arch is normal bilaterally. Orbits: The globes and orbits are within normal limits. Sinuses: The paranasal sinuses and mastoid air cells are clear. Soft tissues: Left infraorbital soft tissue swelling and hematoma is present. There is no significant laceration. No foreign body is present. No other significant soft tissue injury is present. CT CERVICAL SPINE FINDINGS Alignment: There straightening of  the normal cervical lordosis. Slight kyphosis is present at C4-5. AP alignment is anatomic. Skull base and vertebrae: The craniocervical junction is normal.  Vertebral body heights are maintained. No acute or healing fractures are present. Soft tissues and spinal canal: No prevertebral fluid or swelling. No visible canal hematoma. Vascular calcifications are present at the carotid bifurcations, right greater than left. Disc levels: Solid fusion is present C5-6, C6-7, and C7-T1. Adjacent level disease is present at C4-5 with foraminal narrowing bilaterally due to uncovertebral disease. Right foraminal narrowing is present at C3-4 greater than C2-3 due to uncovertebral and facet disease. Upper chest: The lung apices are clear. Right IJ Port-A-Cath is in place. Thoracic inlet is otherwise normal. IMPRESSION: 1. Left infraorbital facial soft tissue swelling without underlying fracture. 2. Normal CT appearance of the brain for age. No acute intracranial abnormality or trauma to the head. 3. No acute or healing facial fractures. 4. No acute trauma to the cervical spine. 5. Cervical spine fusion at C5-6, C6-7, and C7-T1. 6. Adjacent level disease at C4-5 with bilateral foraminal stenosis. Electronically Signed   By: San Morelle M.D.   On: 06/19/2018 06:15    EKG: Independently reviewed.  Sinus tachycardia.  Assessment/Plan Principal Problem:   SIRS (systemic inflammatory response syndrome) (HCC) Active Problems:   Type II diabetes mellitus (HCC)   CAD (coronary artery disease)- PCI 2001- no evaluation since   Metastatic adenocarcinoma to liver ([place])   Cholangiocarcinoma ([place])    1. Sepsis source not clear. -Likely could be intra-abdominal.  Patient is placed on vancomycin and meropenem.  Follow blood cultures patient likely will need paracentesis and also a CT abdomen pelvis to further assess his lower back pain and abdominal cramping.  Patient states that he is okay with starting vasopressors but is otherwise DNR with no CPR no intubation.  Patient is started on Neo-Synephrine since patient is remaining hypotensive despite 3 L of fluids.  Critical care  has been consulted.  Will follow procalcitonin lactate levels repeat CBC metabolic panel.  Albumin infusion.  IV fluids as tolerated. 2. Diabetes mellitus type 2 -since patient's intake is been decreased I have decreased morning dose of Levemir from 62 units to 42 units with sliding scale coverage.  Closely follow CBGs.  Takes Levemir 10 units at bedtime. 3. Obstructive jaundice with cholangiocarcinoma status post drain placement.  Will likely need paracentesis to check for the source of sepsis.  With regarding the cholangiocarcinoma patient is at this time being referred to palliative care. 4. Anemia appears to be chronic follow CBC.  He has history of GI bleed and is on Protonix.  5. History of CAD denies any chest pain. 6. Cirrhosis of the liver -receiving albumin infusion will likely need paracentesis.   DVT prophylaxis: SCDs for now until we get a CT abdomen and also in anticipation of possible procedure. Code Status: DNR but okay with vasopressors. Family Communication: Patient's wife. Disposition Plan: To be determined. Consults called: Pulmonary critical care. Admission status: Inpatient.   Rise Patience MD [facility] Pager [phone].  If 7PM-7AM, please contact night-coverage [email or web address] Password [facility]  06/19/2018, 6:34 AM

## 2018-06-19 NOTE — ED Notes (Addendum)
Attempted to call [facility] for Sepsis patient @03 :20am

## 2018-06-19 NOTE — Procedures (Signed)
Intubation Procedure Note KO BARDON [id number] [birth year]  Procedure: Intubation Indications: Airway protection and maintenance  Procedure Details Consent: Risks of procedure as well as the alternatives and risks of each were explained to the (patient/caregiver).  Consent for procedure obtained. Time Out: Verified patient identification, verified procedure, site/side was marked, verified correct patient position, special equipment/implants available, medications/allergies/relevent history reviewed, required imaging and test results available.  Performed  Maximum sterile technique was used including cap, gloves, hand hygiene and mask.  MAC and 3    Evaluation Hemodynamic Status: BP stable throughout; O2 sats: stable throughout Patient's Current Condition: stable Complications: No apparent complications Patient did tolerate procedure well. Chest X-ray ordered to verify placement.  CXR: pending.   Matthew Lewis 06/19/2018

## 2018-06-19 NOTE — Progress Notes (Signed)
I have seen and assessed patient and agree with Dr. Moise Boring assessment and plan.  Patient is a pleasant unfortunate 63 year old gentleman history of cholangiocarcinoma stage IV, cirrhosis of the liver, history of GI bleed, esophageal varices, obstructive jaundice status post drain placement, diabetes presented to the ED after a fall at home where he was trying to walk.  Patient did not lose consciousness.  Patient noted not to be feeling well 1 day prior to admission.  Patient seen in the ED was noted to have a leukocytosis with a white count of 15, elevated lactic acid level, hyponatremia, creatinine of 1.7, hypotension, fever with a temperature of 102.7.  Patient admitted to the stepdown unit as patient septic.  Patient pancultured.  Patient due to persistent hypotension despite 3 L of IV fluids currently on pressors.  Patient on empiric IV vancomycin and empiric Merrem.  PCCM consulted for further evaluation and management.  Patient for diagnostic and therapeutic ultrasound-guided paracentesis today.  Follow.  No charge.

## 2018-06-19 NOTE — Procedures (Signed)
Endotracheal Intubation Procedure Note Indication for endotracheal intubation: airway protection, GI bleed Sedation: etomidate, fentanyl and midazolam, 20 etomidate, 100 fentanyl, 2versed Paralytic: rocuronium 20 Lidocaine: no Atropine: no Equipment: Macintosh 3 laryngoscope blade and 7.34mm cuffed endotracheal tube Cricoid Pressure: yes Number of attempts: 1 ETT location confirmed by by auscultation, by CXR and ETCO2 monitor.

## 2018-06-19 NOTE — ED Provider Notes (Signed)
[facility] DEPT Provider Note   CSN: [id number] Arrival date & time: 06/19/18  0256     History   Chief Complaint Chief Complaint  Patient presents with   Fall   Fever    HPI Matthew Lewis is a 63 y.o. male.     Patient to ED after fall tonight, questionable historian due to confusion. History of end stage biliary cancer, alcoholic cirrhosis, DM, HTN, PTSD, substance abuse, colon cancer, anal fistula presents after he got up to walk to the kitchen, became weak and fell forward. He does not remember whether he was dizzy or lightheaded before the fall. He denies having chest pain or headache prior to the fall. He currently complains only of back pain which he states is chronic. No nausea or vomiting. He denies cough or congestion, urinary symptoms. He reports he goes to IR every Monday for paracentesis but states he is not undergoing cancer treatment at this point.   The history is provided by the patient. No language interpreter was used.  Fall Pertinent negatives include no chest pain, no abdominal pain and no shortness of breath.  Fever Associated symptoms: no chest pain, no chills, no cough, no dysuria and no vomiting     Past Medical History:  Diagnosis Date   Abscess of anal and rectal regions    horse shoe abscess   Alcoholic cirrhosis ([place])    Arthritis    "everywhere; especially in my spine"   Cirrhosis ([place])    STAGE 1 CIRRHOSIS PATIENT SEES DR NAMDIGAM FOR   Colon cancer ([place]) 05/2005   TX SURGERY WITH LYMPH NODE REMOVAL   Diabetes mellitus without complication ([place])    type 2   DM type 2 with diabetic peripheral neuropathy ([place])    left foot   Fatigue    Fistula, anal    multiple   History of substance abuse ([place])    last alcohol was 08/2011; last marijuania was 08/2011   Hypertension    no bp meds in last year   PTSD (post-traumatic stress disorder)    "severe" HX OF   Sleep deprivation     Patient  Active Problem List   Diagnosis Date Noted   Secondary esophageal varices without bleeding (HCC)    Idiopathic esophageal varices with bleeding ([place])    SIRS (systemic inflammatory response syndrome) ([place]) 05/12/2018   Macrocytic anemia 05/12/2018   Syncope and collapse 05/12/2018   Hypovolemic shock ([place])    Upper GI bleed 02/24/2018   Lactic acidosis 02/24/2018   Acute blood loss anemia 02/24/2018   Port-A-Cath in place 01/28/2018   Dehydration 12/22/2017   Bladder outlet obstruction 12/22/2017   Constipation 12/22/2017   Bladder outflow obstruction    Sepsis due to gram-negative bacteria (HCC)    Severe sepsis with acute organ dysfunction due to Gram negative bacteria ([place]) 12/03/2017   Septic shock ([place])    Cholangiocarcinoma ([place])    Hypotension 11/20/2017   Acute renal failure (ARF) ([place]) 11/20/2017   Palliative care by specialist    Segmental colitis ([place]) 11/09/2017   Elevated lipase 11/09/2017   Metastatic adenocarcinoma to liver ([place]) 11/06/2017   Goals of care, counseling/discussion 11/06/2017   Cirrhosis ([place])    Malignant neoplasm metastatic to adrenal gland [facility])    Biliary obstruction due to malignant neoplasm ([place]) 10/23/2017   Transaminitis 10/23/2017   Liver lesion 10/23/2017   History of colon cancer 10/23/2017   Perirectal abscess 02/24/2017   Moderate protein-calorie malnutrition (  [place]) 02/24/2017   Sepsis ([place]) 02/24/2017   Pulmonary nodule 38/75/6433   Alcoholic cirrhosis of liver without ascites ([place]) 02/24/2017   Duodenitis 06/13/2016   Hyponatremia 06/12/2016   Nausea & vomiting 06/12/2016   Thrombocytopenia ([place]) 06/12/2016   Acute gastroenteritis 06/12/2016   PTSD (post-traumatic stress disorder) 12/02/2012   Hx of substance abuse- clean several years now 12/02/2012   Chest pain with moderate risk of acute coronary syndrome 11/30/2012   Obesity 11/30/2012   Hypertension 11/30/2012   Hyperlipemia  11/30/2012   Type II diabetes mellitus ([place]) 11/30/2012   CAD (coronary artery disease)- PCI 2001- no evaluation since 11/30/2012    Past Surgical History:  Procedure Laterality Date   ABSCESS DRAINAGE     "probably 15-20 so far at least; rectal"   ANAL FISTULECTOMY  12/13/1998   ANTERIOR FUSION CERVICAL SPINE  2010   "triple"; "did 2 surgeries on the same day in 2951 due to complications"   CORONARY ANGIOPLASTY  2001   detached muscle  2010   right chest; "after cervical fusion complications"   ELBOW SURGERY  2007   "cut out part of a muscle"; left   ESOPHAGEAL BANDING  02/25/2018   Procedure: ESOPHAGEAL BANDING;  Surgeon: Ladene Artist, MD;  Location: [facility];  Service: Endoscopy;;   ESOPHAGEAL BANDING  05/12/2018   Procedure: ESOPHAGEAL BANDING;  Surgeon: Gatha Mayer, MD;  Location: [facility];  Service: Endoscopy;;   ESOPHAGEAL BANDING  05/14/2018   Procedure: ESOPHAGEAL BANDING;  Surgeon: Yetta Flock, MD;  Location: [facility];  Service: Gastroenterology;;   ESOPHAGEAL BANDING  06/11/2018   Procedure: ESOPHAGEAL BANDING;  Surgeon: Mauri Pole, MD;  Location: [facility];  Service: Endoscopy;;   ESOPHAGOGASTRODUODENOSCOPY N/A 05/12/2018   Procedure: ESOPHAGOGASTRODUODENOSCOPY (EGD);  Surgeon: Gatha Mayer, MD;  Location: [facility];  Service: Endoscopy;  Laterality: N/A;   ESOPHAGOGASTRODUODENOSCOPY (EGD) WITH PROPOFOL N/A 02/25/2018   Procedure: ESOPHAGOGASTRODUODENOSCOPY (EGD) WITH PROPOFOL;  Surgeon: Ladene Artist, MD;  [facility];  Service: Endoscopy;  Laterality: N/A;   ESOPHAGOGASTRODUODENOSCOPY (EGD) WITH PROPOFOL N/A 05/14/2018   Procedure: ESOPHAGOGASTRODUODENOSCOPY (EGD) WITH PROPOFOL;  Surgeon: Yetta Flock, MD;  [facility]: [facility];  Service: Gastroenterology;  Laterality: N/A;   ESOPHAGOGASTRODUODENOSCOPY (EGD) WITH PROPOFOL N/A 06/11/2018   Procedure: ESOPHAGOGASTRODUODENOSCOPY (EGD) WITH PROPOFOL;   Surgeon: Mauri Pole, MD;  Location: [facility];  Service: Endoscopy;  Laterality: N/A;   EXAMINATION UNDER ANESTHESIA  03/22/2005   fistula   EXAMINATION UNDER ANESTHESIA  06/06/2011   Procedure: EXAM UNDER ANESTHESIA;  Surgeon: Stark Klein, MD;  Location: [facility];  Service: General;  Laterality: N/A;   HEMICOLECTOMY  06/06/2005   left   INCISION AND DRAINAGE PERIRECTAL ABSCESS  03/30/2005   INCISION AND DRAINAGE PERIRECTAL ABSCESS  12/13/2004   INCISION AND DRAINAGE PERIRECTAL ABSCESS  07/14/2001   INCISION AND DRAINAGE PERIRECTAL ABSCESS  08/12/2010   horseshoe abscess; Dr Redmond Pulling   INCISION AND DRAINAGE PERIRECTAL ABSCESS  06/06/2011   Procedure: IRRIGATION AND DEBRIDEMENT PERIRECTAL ABSCESS;  Surgeon: Stark Klein, MD;  Location: [facility];  Service: General;  Laterality: N/A;   INCISION AND DRAINAGE PERIRECTAL ABSCESS N/A 02/24/2017   Procedure: IRRIGATION AND DEBRIDEMENT PERIRECTAL ABSCESS;  Surgeon: Ralene Ok, MD;  Location: [facility];  Service: General;  Laterality: N/A;   IR CATHETER TUBE CHANGE  02/19/2018   IR CHOLANGIOGRAM EXISTING TUBE  12/23/2017   IR CHOLANGIOGRAM EXISTING TUBE  01/02/2018   IR CHOLANGIOGRAM EXISTING TUBE  05/04/2018  IR EXCHANGE BILIARY DRAIN  11/07/2017   IR EXCHANGE BILIARY DRAIN  12/09/2017   IR EXCHANGE BILIARY DRAIN  02/03/2018   IR EXCHANGE BILIARY DRAIN  02/03/2018   IR EXCHANGE BILIARY DRAIN  04/03/2018   IR EXCHANGE BILIARY DRAIN  04/03/2018   IR EXCHANGE BILIARY DRAIN  04/23/2018   IR EXCHANGE BILIARY DRAIN  04/28/2018   IR EXCHANGE BILIARY DRAIN  04/28/2018   IR FLUORO RM 30-60 MIN  04/15/2018   IR IMAGING GUIDED PORT INSERTION  12/09/2017   IR INT EXT BILIARY DRAIN WITH CHOLANGIOGRAM  10/26/2017   IR INT EXT BILIARY DRAIN WITH CHOLANGIOGRAM  12/23/2017   IR PARACENTESIS  02/25/2018   IR PARACENTESIS  05/26/2018   IR PARACENTESIS  06/02/2018   IR PARACENTESIS  06/08/2018   IR PARACENTESIS  06/15/2018   IR PATIENT EVAL TECH  0-60 MINS  12/02/2017   IR RADIOLOGIST EVAL & MGMT  11/06/2017   IR RADIOLOGIST EVAL & MGMT  01/05/2018   LUMBAR DISC SURGERY  1987   PERCUTANEOUS PINNING PHALANX FRACTURE OF HAND  ~ 2008   "plates in 2 places"; right   PLACEMENT OF SETON N/A 09/05/2017   Procedure: PLACEMENT OF SETON;  Surgeon: Leighton Ruff, MD;  Location: [facility];  Service: General;  Laterality: N/A;   RADIOLOGY WITH ANESTHESIA N/A 10/25/2017   Procedure: MRI WITH ANESTHESIA;  Surgeon: Radiologist, Medication, MD;  Location: [facility];  Service: Radiology;  Laterality: N/A;   THORACIC DISCECTOMY  1990's        Home Medications    Prior to Admission medications   Medication Sig Start Date End Date Taking? Authorizing Provider  acetaminophen (TYLENOL) 325 MG tablet Take 650 mg by mouth every 6 (six) hours as needed for mild pain.   Yes [provider]  calcium carbonate (TUMS - DOSED IN MG ELEMENTAL CALCIUM) 500 MG chewable tablet Chew 2 tablets by mouth daily as needed for indigestion or heartburn.   Yes [provider]  celecoxib (CELEBREX) 200 MG capsule Take 200 mg by mouth daily.   Yes [provider]  DULoxetine (CYMBALTA) 30 MG capsule Take 30 mg by mouth 2 (two) times daily.   Yes [provider]  fexofenadine-pseudoephedrine (ALLEGRA-D) 60-120 MG 12 hr tablet Take 1 tablet by mouth daily.   Yes [provider]  gabapentin (NEURONTIN) 400 MG capsule Take 400-800 mg by mouth See admin instructions. Take 400 mg by mouth in the morning and 800 mg in the evening   Yes [provider]  hydroxypropyl methylcellulose / hypromellose (ISOPTO TEARS / GONIOVISC) 2.5 % ophthalmic solution 1 drop 3 (three) times daily as needed for dry eyes.   Yes [provider]  insulin detemir (LEVEMIR) 100 UNIT/ML injection Inject 10-62 Units into the skin See admin instructions. Take 62units in the am and 10units in the pm   Yes [provider]    levocetirizine (XYZAL) 5 MG tablet Take 5 mg by mouth every evening.   Yes [provider]  lidocaine (LIDODERM) 5 % Place 1 patch onto the skin daily. Remove & Discard patch within 12 hours or as directed by MD Patient taking differently: Place 1 patch onto the skin daily as needed (pain).  05/17/18  Yes Shelly Coss, MD  morphine (MSIR) 15 MG tablet Take 0.5 tablets (7.5 mg total) by mouth every 6 (six) hours as needed for severe pain. 06/12/18  Yes [name], MD  Multiple Vitamin (MULTIVITAMIN) tablet Take 1 tablet  by mouth daily.   Yes [provider]  ondansetron (ZOFRAN) 8 MG tablet Take 8 mg by mouth 2 (two) times daily as needed for nausea or vomiting.   Yes [provider]  pantoprazole (PROTONIX) 40 MG tablet Take 1 tablet (40 mg total) by mouth daily at 6 (six) AM. 06/12/18  Yes [name], MD  sodium chloride 0.9 % injection Inject 10 mLs into the vein daily. 06/12/18  Yes [name], MD  tamsulosin (FLOMAX) 0.4 MG CAPS capsule TAKE 1 CAPSULE(0.4 MG) BY MOUTH DAILY AFTER SUPPER Patient taking differently: Take 0.4 mg by mouth daily after supper.  03/12/18  Yes [name], MD  diphenoxylate-atropine (LOMOTIL) 2.5-0.025 MG tablet Take 1 tablet by mouth 4 (four) times daily as needed for diarrhea or loose stools. Patient not taking: Reported on 06/12/2018 05/26/18   Alla Feeling, NP  HYDROcodone-acetaminophen (NORCO) 5-325 MG tablet Take 1 tablet by mouth every 6 (six) hours as needed for moderate pain. Patient not taking: Reported on 06/19/2018 06/02/18   [name], MD  lidocaine-prilocaine (EMLA) cream Apply to port site 1 hour prior to access Patient taking differently: Apply 1 application topically once. Apply to port site 1 hour prior to access 01/20/18   [name], MD  nadolol (CORGARD) 20 MG tablet Take 1 tablet (20 mg total) by mouth daily. Patient not taking: Reported on 06/19/2018 06/11/18 06/11/19  Mauri Pole, MD  sodium chloride flush (NS) 0.9 % SOLN Place  10 mLs into feeding tube as needed. Patient not taking: Reported on 06/19/2018 06/12/18   [name], MD    Family History Family History  Problem Relation Age of Onset   Heart disease Father    Heart disease Mother    Cancer Mother    Cancer Sister    Heart disease Brother     Social History Social History   Tobacco Use   Smoking status: Former Smoker    Packs/day: 1.00    Years: 10.00    Pack years: 10.00    Types: Cigarettes    Quit date: 01/08/1991    Years since quitting: 27.4   Smokeless tobacco: Never Used  Substance Use Topics   Alcohol use: Not Currently    Comment: 06/05/11 "drank enough when I was younger to last me my whole life; don't remember when I had my last drink, maybe 2012"   Drug use: Not Currently    Types: Oxycodone, Marijuana    Comment: 06/05/11 "pot head in my younger days; 2009 dr had me on oxycodone and valium for almost 26yr     Allergies   Morphine and related   Review of Systems Review of Systems  Constitutional: Negative for chills and fever.  HENT: Positive for facial swelling.   Respiratory: Negative.  Negative for cough and shortness of breath.   Cardiovascular: Negative.  Negative for chest pain.  Gastrointestinal: Negative.  Negative for abdominal pain and vomiting.  Genitourinary: Negative for dysuria.  Musculoskeletal: Positive for back pain.  Skin: Negative.  Negative for wound.  Neurological: Positive for weakness.     Physical Exam Updated Vital Signs BP (!) 94/52    Pulse (!) 117    Temp (!) 103.1 F (39.5 C) (Rectal)    Resp 20    Ht 5' 11"  (1.803 m)    Wt 93.9 kg    SpO2 95%    BMI 28.87 kg/m   Physical Exam Vitals signs and nursing note reviewed.  Constitutional:  General: He is not in acute distress.    Appearance: He is well-developed. He is ill-appearing.  HENT:     Head: Normocephalic and atraumatic.     Comments: Bruising with abrasion to left cheek.     Nose: Nose normal.     Mouth/Throat:      Comments: Abrasion/contusion to left upper and lower lip without laceration. No dental injury.  Eyes:     Extraocular Movements: Extraocular movements intact.     Pupils: Pupils are equal, round, and reactive to light.  Neck:     Musculoskeletal: Normal range of motion and neck supple. No muscular tenderness.  Cardiovascular:     Rate and Rhythm: Regular rhythm. Tachycardia present.  Pulmonary:     Effort: Pulmonary effort is normal.     Breath sounds: Normal breath sounds. No wheezing, rhonchi or rales.     Comments: Poor effort Abdominal:     General: Bowel sounds are normal. There is distension.     Palpations: Abdomen is soft.     Tenderness: There is abdominal tenderness. There is no guarding or rebound.     Comments: Diffuse, very mild abdominal tenderness. There are biliary drains x 2 in abdominal wall, sites unremarkable. Moderate ascites.  Genitourinary:    Comments: Stents x 2 at rectum (history of perianal fistula/abscess) Musculoskeletal: Normal range of motion.  Skin:    General: Skin is warm and dry.     Coloration: Skin is jaundiced.     Findings: No rash.  Neurological:     Mental Status: He is oriented to person, place, and time.     Coordination: Coordination normal.     Comments: Mild confusion, generally oriented.       ED Treatments / Results  Labs (all labs ordered are listed, but only abnormal results are displayed) Labs Reviewed  COMPREHENSIVE METABOLIC PANEL - Abnormal; Notable for the following components:      Result Value   Sodium 127 (*)    CO2 18 (*)    Glucose, Bld 161 (*)    Calcium 7.6 (*)    Albumin 2.2 (*)    AST 51 (*)    Alkaline Phosphatase 345 (*)    Total Bilirubin 2.9 (*)    All other components within normal limits  LACTIC ACID, PLASMA - Abnormal; Notable for the following components:   Lactic Acid, Venous 3.0 (*)    All other components within normal limits  CBC WITH DIFFERENTIAL/PLATELET - Abnormal; Notable for the  following components:   WBC 15.7 (*)    RBC 2.84 (*)    Hemoglobin 8.0 (*)    HCT 25.4 (*)    RDW 16.7 (*)    Neutro Abs 14.0 (*)    Lymphs Abs 0.6 (*)    Abs Immature Granulocytes 0.15 (*)    All other components within normal limits  PROTIME-INR - Abnormal; Notable for the following components:   Prothrombin Time 17.7 (*)    INR 1.5 (*)    All other components within normal limits  URINALYSIS, ROUTINE W REFLEX MICROSCOPIC - Abnormal; Notable for the following components:   Color, Urine AMBER (*)    APPearance HAZY (*)    All other components within normal limits  SARS CORONAVIRUS 2 (HOSPITAL ORDER, [facility])  CULTURE, BLOOD (ROUTINE X 2)  CULTURE, BLOOD (ROUTINE X 2)  URINE CULTURE  LACTIC ACID, PLASMA    EKG EKG Interpretation  Date/Time:  Friday June 19 2018  03:52:59 EDT Ventricular Rate:  122 PR Interval:    QRS Duration: 103 QT Interval:  352 QTC Calculation: 502 R Axis:   77 Text Interpretation:  Sinus tachycardia Borderline low voltage, extremity leads Prolonged QT interval No significant change since last tracing Confirmed by Orpah Greek ([id number]) on 06/19/2018 4:08:33 AM   Radiology Dg Chest Port 1 View  Result Date: 06/19/2018 CLINICAL DATA:  Fall. Sepsis. Cholangiocarcinoma with metastatic disease. EXAM: PORTABLE CHEST 1 VIEW COMPARISON:  One-view chest x-ray 05/12/2018 CT of the chest 02/19/2018 FINDINGS: Heart size is normal. Right IJ Port-A-Cath is stable. The tip is in the right atrium. Asymmetric interstitial pattern is again noted on the left. Left basilar metastasis is not well seen. Lung volumes are low. Mild pulmonary vascular congestion is present without frank edema. IMPRESSION: 1. Cardiomegaly with mild vascular congestion. 2. Low lung volumes. 3. Stable chronic interstitial changes, asymmetric on the left. Electronically Signed   By: San Morelle M.D.   On: 06/19/2018 04:27    Procedures Procedures  (including critical care time) CRITICAL CARE Performed by: Dewaine Oats   Total critical care time: 45 minutes  Critical care time was exclusive of separately billable procedures and treating other patients.  Critical care was necessary to treat or prevent imminent or life-threatening deterioration.  Critical care was time spent personally by me on the following activities: development of treatment plan with patient and/or surrogate as well as nursing, discussions with consultants, evaluation of patient's response to treatment, examination of patient, obtaining history from patient or surrogate, ordering and performing treatments and interventions, ordering and review of laboratory studies, ordering and review of radiographic studies, pulse oximetry and re-evaluation of patient's condition.  Medications Ordered in ED Medications  sodium chloride flush (NS) 0.9 % injection 3 mL (3 mLs Intravenous Not Given 06/19/18 0345)  vancomycin (VANCOCIN) 2,000 mg in sodium chloride 0.9 % 500 mL IVPB (2,000 mg Intravenous New Bag/Given 06/19/18 0414)  sodium chloride 0.9 % bolus 1,000 mL (0 mLs Intravenous Stopped 06/19/18 0445)  ceFEPIme (MAXIPIME) 2 g in sodium chloride 0.9 % 100 mL IVPB (0 g Intravenous Stopped 06/19/18 0414)  metroNIDAZOLE (FLAGYL) IVPB 500 mg (0 mg Intravenous Stopped 06/19/18 0445)  morphine 4 MG/ML injection 4 mg (4 mg Intravenous Given 06/19/18 0410)  ondansetron (ZOFRAN) injection 4 mg (4 mg Intravenous Given 06/19/18 0410)  sodium chloride 0.9 % bolus 2,000 mL (2,000 mLs Intravenous New Bag/Given 06/19/18 0445)     Initial Impression / Assessment and Plan / ED Course  I have reviewed the triage vital signs and the nursing notes.  Pertinent labs & imaging results that were available during my care of the patient were reviewed by me and considered in my medical decision making (see chart for details).    Robbie Rideaux was evaluated in the Emergency Department on 06/19/18 for  the symptoms described in the history of present illness. He was evaluated in the context of the global COVID-19 pandemic, which necessitated consideration that the patient might be at risk for infection with the SARS-CoV-2 virus that causes COVID-19. Institutional protocols and algorithms that pertain to the evaluation of patients at risk for COVID-19 are in a state of rapid change based on information released by regulatory bodies including the CDC and federal and state organizations. These policies and algorithms were followed during the patient's care in the ED.      Patient to ED for evaluation of fall due to weakness. He is awake, mildly confused, with  facial abrasions. He was unaware he had a fever. On arrival his temp is 103 R.  The patient appears chronically ill appearing but in NAD. VS strongly suggest sepsis. Cefepime, Flagyl, Vanc started for unknown source sepsis. IVF's running for 30 ml/kg bolus.   His CXR is negative for source of fever. COVID negative. He has a mild leukocytosis. Urine is pending. Head, neck and facial CT's pending for evaluation of fall and facial injury.   Hospitalist consulted for admission, Dr. Hal Hope accepting.   Final Clinical Impressions(s) / ED Diagnoses   Final diagnoses:  [facility])    ED Discharge Orders    None       Charlann Lange, PA-C 06/19/18 0535    Orpah Greek, MD 06/19/18 [phone]

## 2018-06-20 LAB — GLUCOSE, CAPILLARY
Glucose-Capillary: 50 mg/dL — ABNORMAL LOW (ref 70–99)
Glucose-Capillary: 113 mg/dL — ABNORMAL HIGH (ref 70–99)
Glucose-Capillary: 49 mg/dL — ABNORMAL LOW (ref 70–99)
Glucose-Capillary: 106 mg/dL — ABNORMAL HIGH (ref 70–99)
Glucose-Capillary: 76 mg/dL (ref 70–99)
Glucose-Capillary: 80 mg/dL (ref 70–99)

## 2018-06-20 LAB — URINE CULTURE: Culture: NO GROWTH

## 2018-06-20 LAB — CBC WITH DIFFERENTIAL/PLATELET
Abs Immature Granulocytes: 0.06 10*3/uL (ref 0.00–0.07)
Basophils Absolute: 0 10*3/uL (ref 0.0–0.1)
Basophils Relative: 0 %
Eosinophils Absolute: 0.4 10*3/uL (ref 0.0–0.5)
Eosinophils Relative: 4 %
HCT: 25.5 % — ABNORMAL LOW (ref 39.0–52.0)
Hemoglobin: 8 g/dL — ABNORMAL LOW (ref 13.0–17.0)
Immature Granulocytes: 1 %
Lymphocytes Relative: 5 %
Lymphs Abs: 0.5 10*3/uL — ABNORMAL LOW (ref 0.7–4.0)
MCH: 28.2 pg (ref 26.0–34.0)
MCHC: 31.4 g/dL (ref 30.0–36.0)
MCV: 89.8 fL (ref 80.0–100.0)
Monocytes Absolute: 0.8 10*3/uL (ref 0.1–1.0)
Monocytes Relative: 8 %
Neutro Abs: 8.8 10*3/uL — ABNORMAL HIGH (ref 1.7–7.7)
Neutrophils Relative %: 82 %
Platelets: 169 10*3/uL (ref 150–400)
RBC: 2.84 MIL/uL — ABNORMAL LOW (ref 4.22–5.81)
RDW: 16.4 % — ABNORMAL HIGH (ref 11.5–15.5)
WBC: 10.7 10*3/uL — ABNORMAL HIGH (ref 4.0–10.5)
nRBC: 0 % (ref 0.0–0.2)

## 2018-06-20 LAB — PROTIME-INR
INR: 1.6 — ABNORMAL HIGH (ref 0.8–1.2)
Prothrombin Time: 18.6 seconds — ABNORMAL HIGH (ref 11.4–15.2)

## 2018-06-20 LAB — COMPREHENSIVE METABOLIC PANEL
ALT: 23 U/L (ref 0–44)
AST: 49 U/L — ABNORMAL HIGH (ref 15–41)
Albumin: 2.8 g/dL — ABNORMAL LOW (ref 3.5–5.0)
Alkaline Phosphatase: 224 U/L — ABNORMAL HIGH (ref 38–126)
Anion gap: 4 — ABNORMAL LOW (ref 5–15)
BUN: 13 mg/dL (ref 8–23)
CO2: 22 mmol/L (ref 22–32)
Calcium: 7.9 mg/dL — ABNORMAL LOW (ref 8.9–10.3)
Chloride: 113 mmol/L — ABNORMAL HIGH (ref 98–111)
Creatinine, Ser: 0.48 mg/dL — ABNORMAL LOW (ref 0.61–1.24)
GFR calc Af Amer: >60 mL/min (ref >60)
GFR calc non Af Amer: >60 mL/min (ref >60)
Glucose, Bld: 63 mg/dL — ABNORMAL LOW (ref 70–99)
Potassium: 3.4 mmol/L — ABNORMAL LOW (ref 3.5–5.1)
Sodium: 139 mmol/L (ref 135–145)
Total Bilirubin: 5.9 mg/dL — ABNORMAL HIGH (ref 0.3–1.2)
Total Protein: 6.5 g/dL (ref 6.5–8.1)

## 2018-06-20 LAB — LACTIC ACID, PLASMA: Lactic Acid, Venous: 1.2 mmol/L (ref 0.5–1.9)

## 2018-06-20 LAB — HEMOGLOBIN AND HEMATOCRIT, BLOOD
HCT: 25 % — ABNORMAL LOW (ref 39.0–52.0)
Hemoglobin: 8 g/dL — ABNORMAL LOW (ref 13.0–17.0)

## 2018-06-20 LAB — TRIGLYCERIDES: Triglycerides: 80 mg/dL (ref <150)

## 2018-06-20 LAB — MAGNESIUM: Magnesium: 1.6 mg/dL — ABNORMAL LOW (ref 1.7–2.4)

## 2018-06-20 MED ORDER — DEXTROSE 50 % IV SOLN
INTRAVENOUS | Status: AC
  Administered 2018-06-20: 50 mL
  Filled 2018-06-20: qty 50

## 2018-06-20 MED ORDER — SODIUM CHLORIDE 0.9% FLUSH
10.0000 mL | Freq: Two times a day (BID) | INTRAVENOUS | Status: DC
  Administered 2018-06-20 – 2018-06-22 (×6): 10 mL
  Administered 2018-06-23: 30 mL
  Administered 2018-06-23 – 2018-06-26 (×5): 10 mL

## 2018-06-20 MED ORDER — MAGNESIUM SULFATE 4 GM/100ML IV SOLN
4.0000 g | Freq: Once | INTRAVENOUS | Status: AC
  Administered 2018-06-20: 4 g via INTRAVENOUS
  Filled 2018-06-20: qty 100

## 2018-06-20 MED ORDER — SODIUM CHLORIDE 0.9% FLUSH
10.0000 mL | INTRAVENOUS | Status: DC | PRN
  Administered 2018-06-20: 10 mL
  Filled 2018-06-20: qty 40

## 2018-06-20 NOTE — Progress Notes (Signed)
Cross cover [facility] Subjective: Since I last evaluated the patient, he seems to be relatively stable from a GI standpoint. He had some small volume maroon stools this morning X 3 [his nurse feels this may be old blood]. He is still on pressors. He has been extubated since he had his procedure last night which was done emergently for a GI bleeding with anemia. No fresh or old heme was noted in the upper GI tract on endoscopy done yesterday. Patient denies having any nausea vomiting or abdominal pain. He was admitted with this hypotension and fever and is presumed to have sepsis and the work-up is in progress. On 06/11/2018 he had banding of his esophageal varices done and all the varices were obliterated. His last colonoscopy was done in October 2019 when some diminutive diminutive polyps were noted in the rectum and these were not removed. Congested rectal mucosa was reported but no diverticulosis or hemorrhoids were noted he had a seton in place. I discussed his CODE STATUS with him again and he said he would like some time to think about it.   Objective: Vital signs in last 24 hours: Temp:  [97.5 F (36.4 C)-98.3 F (36.8 C)] 97.9 F (36.6 C) (06/13 0913) Pulse Rate:  [82-109] 85 (06/12 2300) Resp:  [11-28] 20 (06/13 1000) BP: (85-143)/(47-94) 100/57 (06/13 1000) SpO2:  [90 %-100 %] 100 % (06/13 1000) FiO2 (%):  [40 %-100 %] 40 % (06/13 0404) Weight:  [99.1 kg] 99.1 kg (06/13 0500) Last BM Date: 06/19/18  Intake/Output from previous day: 06/12 0701 - 06/13 0700 In: 9488.4 [I.V.:7024.8; Blood:709.2; IV Piggyback:1754.4] Out: 2000 [Urine:1800; Drains:200] Intake/Output this shift: Total I/O In: -  Out: 1300 [Urine:1300]  General appearance: alert, cooperative, appears stated age, no distress and morbidly obese Resp: clear to auscultation bilaterally Cardio: regular rate and rhythm, S1, S2 normal, no murmur, click, rub or gallop GI: soft, obese, distended non-tender with hypoactive bowel  sounds; no masses,  no organomegaly Extremities: extremities normal, atraumatic, no cyanosis or edema  Lab Results: Recent Labs    06/19/18 0330 06/19/18 1145 06/20/18 0047 06/20/18 0347  WBC 15.7* 11.8*  --  10.7*  HGB 8.0* 5.9* 8.0* 8.0*  HCT 25.4* 19.1* 25.0* 25.5*  PLT 228 157  --  169   BMET Recent Labs    06/19/18 0330 06/19/18 1145 06/20/18 0347  NA 127* 130* 139  K 4.0 4.2 3.4*  CL 98 105 113*  CO2 18* 21* 22  GLUCOSE 161* 145* 63*  BUN 12 13 13   CREATININE 0.77 0.76 0.48*  CALCIUM 7.6* 7.2* 7.9*   LFT Recent Labs    06/20/18 0347  PROT 6.5  ALBUMIN 2.8*  AST 49*  ALT 23  ALKPHOS 224*  BILITOT 5.9*   PT/INR Recent Labs    06/19/18 1145 06/20/18 0347  LABPROT 19.4* 18.6*  INR 1.7* 1.6*   Studies/Results: Ct Head Wo Contrast  Result Date: 06/19/2018 CLINICAL DATA:  Head trauma, minor. Fall. Cholangiocarcinoma with known metastatic disease. Initial encounter. EXAM: CT HEAD WITHOUT CONTRAST CT MAXILLOFACIAL WITHOUT CONTRAST CT CERVICAL SPINE WITHOUT CONTRAST TECHNIQUE: Multidetector CT imaging of the head, cervical spine, and maxillofacial structures were performed using the standard protocol without intravenous contrast. Multiplanar CT image reconstructions of the cervical spine and maxillofacial structures were also generated. COMPARISON:  CT of the head and cervical spine 05/12/2018. MRI brain 02/26/2018 FINDINGS: CT HEAD FINDINGS Brain: No acute infarct, hemorrhage, or mass lesion is present. No significant white matter lesions are present.  The ventricles are of normal size. No significant extraaxial fluid collection is present. The brainstem and cerebellum are within normal limits. Vascular: Atherosclerotic calcifications are present in the cavernous right internal carotid artery. There is no hyperdense vessel. Skull: Calvarium is intact. No focal lytic or blastic lesions are present. CT MAXILLOFACIAL FINDINGS Osseous: No acute or healing facial fractures  are present. Nasal bones are intact. Zygomatic arch is normal bilaterally. Orbits: The globes and orbits are within normal limits. Sinuses: The paranasal sinuses and mastoid air cells are clear. Soft tissues: Left infraorbital soft tissue swelling and hematoma is present. There is no significant laceration. No foreign body is present. No other significant soft tissue injury is present. CT CERVICAL SPINE FINDINGS Alignment: There straightening of the normal cervical lordosis. Slight kyphosis is present at C4-5. AP alignment is anatomic. Skull base and vertebrae: The craniocervical junction is normal. Vertebral body heights are maintained. No acute or healing fractures are present. Soft tissues and spinal canal: No prevertebral fluid or swelling. No visible canal hematoma. Vascular calcifications are present at the carotid bifurcations, right greater than left. Disc levels: Solid fusion is present C5-6, C6-7, and C7-T1. Adjacent level disease is present at C4-5 with foraminal narrowing bilaterally due to uncovertebral disease. Right foraminal narrowing is present at C3-4 greater than C2-3 due to uncovertebral and facet disease. Upper chest: The lung apices are clear. Right IJ Port-A-Cath is in place. Thoracic inlet is otherwise normal. IMPRESSION: 1. Left infraorbital facial soft tissue swelling without underlying fracture. 2. Normal CT appearance of the brain for age. No acute intracranial abnormality or trauma to the head. 3. No acute or healing facial fractures. 4. No acute trauma to the cervical spine. 5. Cervical spine fusion at C5-6, C6-7, and C7-T1. 6. Adjacent level disease at C4-5 with bilateral foraminal stenosis. Electronically Signed   By: San Morelle M.D.   On: 06/19/2018 06:15   Ct Cervical Spine Wo Contrast  Result Date: 06/19/2018 CLINICAL DATA:  Head trauma, minor. Fall. Cholangiocarcinoma with known metastatic disease. Initial encounter. EXAM: CT HEAD WITHOUT CONTRAST CT MAXILLOFACIAL  WITHOUT CONTRAST CT CERVICAL SPINE WITHOUT CONTRAST TECHNIQUE: Multidetector CT imaging of the head, cervical spine, and maxillofacial structures were performed using the standard protocol without intravenous contrast. Multiplanar CT image reconstructions of the cervical spine and maxillofacial structures were also generated. COMPARISON:  CT of the head and cervical spine 05/12/2018. MRI brain 02/26/2018 FINDINGS: CT HEAD FINDINGS Brain: No acute infarct, hemorrhage, or mass lesion is present. No significant white matter lesions are present. The ventricles are of normal size. No significant extraaxial fluid collection is present. The brainstem and cerebellum are within normal limits. Vascular: Atherosclerotic calcifications are present in the cavernous right internal carotid artery. There is no hyperdense vessel. Skull: Calvarium is intact. No focal lytic or blastic lesions are present. CT MAXILLOFACIAL FINDINGS Osseous: No acute or healing facial fractures are present. Nasal bones are intact. Zygomatic arch is normal bilaterally. Orbits: The globes and orbits are within normal limits. Sinuses: The paranasal sinuses and mastoid air cells are clear. Soft tissues: Left infraorbital soft tissue swelling and hematoma is present. There is no significant laceration. No foreign body is present. No other significant soft tissue injury is present. CT CERVICAL SPINE FINDINGS Alignment: There straightening of the normal cervical lordosis. Slight kyphosis is present at C4-5. AP alignment is anatomic. Skull base and vertebrae: The craniocervical junction is normal. Vertebral body heights are maintained. No acute or healing fractures are present. Soft tissues and spinal  canal: No prevertebral fluid or swelling. No visible canal hematoma. Vascular calcifications are present at the carotid bifurcations, right greater than left. Disc levels: Solid fusion is present C5-6, C6-7, and C7-T1. Adjacent level disease is present at C4-5  with foraminal narrowing bilaterally due to uncovertebral disease. Right foraminal narrowing is present at C3-4 greater than C2-3 due to uncovertebral and facet disease. Upper chest: The lung apices are clear. Right IJ Port-A-Cath is in place. Thoracic inlet is otherwise normal. IMPRESSION: 1. Left infraorbital facial soft tissue swelling without underlying fracture. 2. Normal CT appearance of the brain for age. No acute intracranial abnormality or trauma to the head. 3. No acute or healing facial fractures. 4. No acute trauma to the cervical spine. 5. Cervical spine fusion at C5-6, C6-7, and C7-T1. 6. Adjacent level disease at C4-5 with bilateral foraminal stenosis. Electronically Signed   By: San Morelle M.D.   On: 06/19/2018 06:15   Ct Abdomen Pelvis W Contrast  Result Date: 06/19/2018 CLINICAL DATA:  Abdominal pain. History of cholangiocarcinoma. Possible sepsis. EXAM: CT ABDOMEN AND PELVIS WITH CONTRAST TECHNIQUE: Multidetector CT imaging of the abdomen and pelvis was performed using the standard protocol following bolus administration of intravenous contrast. CONTRAST:  112mL OMNIPAQUE IOHEXOL 300 MG/ML  SOLN COMPARISON:  CT scan of February 19, 2018. FINDINGS: Lower chest: Right lower lobe pneumonia or atelectasis is noted. Stable rounded density is noted in left posterior costophrenic sulcus concerning for metastatic disease. Small left pleural effusion is noted. Hepatobiliary: Air and probable sludge is noted within gallbladder lumen. Hepatic cirrhosis is noted. Right hepatic pneumobilia is noted. Multiple low densities are noted throughout the liver which may represent metastatic disease. Stable position of 2 right external biliary drains are noted. Pancreas: Unremarkable. No pancreatic ductal dilatation or surrounding inflammatory changes. Spleen: Stable splenomegaly is noted. No focal abnormality is noted. Adrenals/Urinary Tract: Stable bilateral adrenal gland enlargement is noted. Bilateral  nonobstructive nephrolithiasis is noted. No hydronephrosis or renal obstruction is noted. Urinary bladder is unremarkable. Stomach/Bowel: The stomach appears normal. Stool is noted throughout the colon. There is no evidence of bowel obstruction. The appendix is not clearly visualized. Mild wall and fold thickening is seen involving the right and transverse colon which may represent infectious or inflammatory colitis. Vascular/Lymphatic: Aortic atherosclerosis is noted. Grossly stable 16 mm right periaortic lymph node is noted. Stable 1.6 cm left periaortic lymph node is noted. Reproductive: Prostate is unremarkable. Other: Mild ascites is noted. Musculoskeletal: No acute or significant osseous findings. IMPRESSION: Continued presence of probable multiple hepatic metastatic lesions. Hepatic cirrhosis is noted, with continued presence of 2 external biliary drainage catheters. Right hepatic pneumobilia is noted. Stable rounded density noted in left posterior costophrenic sulcus concerning for metastatic disease. Interval development of mild ascites. Wall thickening of transverse and right colon is noted which may represent infectious or inflammatory colitis, or potentially may be due to ascites. Stable splenomegaly. Stable bilateral adrenal gland enlargement is noted consistent with metastatic disease. Stable periaortic adenopathy is noted which may be neoplastic or inflammatory in etiology. Aortic Atherosclerosis (ICD10-I70.0). Electronically Signed   By: Marijo Conception M.D.   On: 06/19/2018 16:37   US Paracentesis  Result Date: 06/19/2018 INDICATION: Patient with history of intrahepatic cholangiocarcinoma, NASH cirrhosis, recurrent ascites, fever/sepsis, anemia; request made for diagnostic paracentesis up to 1 liter. EXAM: ULTRASOUND GUIDED DIAGNOSTIC PARACENTESIS MEDICATIONS: None COMPLICATIONS: None immediate. PROCEDURE: Informed written consent was obtained from the patient after a discussion of the risks,  benefits and alternatives to  treatment. A timeout was performed prior to the initiation of the procedure. Initial ultrasound scanning demonstrates a large amount of ascites within the left lower abdominal quadrant. The left lower abdomen was prepped and draped in the usual sterile fashion. 1% lidocaine was used for local anesthesia. Following this, a 19 gauge, 10-cm, Yueh catheter was introduced. An ultrasound image was saved for documentation purposes. The paracentesis was performed. The catheter was removed and a dressing was applied. The patient tolerated the procedure well without immediate post procedural complication. FINDINGS: A total of approximately 1 liter of yellow fluid was removed. Samples were sent to the laboratory as requested by the clinical team. IMPRESSION: Successful ultrasound-guided diagnostic paracentesis yielding 1 liter of peritoneal fluid. Read by: Rowe Robert, PA-C Electronically Signed   By: Aletta Edouard M.D.   On: 06/19/2018 16:12   Portable Chest X-ray  Result Date: 06/19/2018 CLINICAL DATA:  ET tube placement EXAM: PORTABLE CHEST 1 VIEW COMPARISON:  06/19/2018 FINDINGS: Endotracheal tube is 4 cm above the carina. Right Port-A-Cath is unchanged with the tip in the right atrium. Heart is normal size. Low lung volumes. Increasing perihilar opacities and bibasilar opacities which could reflect edema and/or atelectasis. No visible significant effusions or acute bony abnormality. IMPRESSION: Endotracheal tube 4 cm above the carina. Increasing perihilar and bibasilar opacities which could reflect edema or atelectasis. Low lung volumes. Electronically Signed   By: Rolm Baptise M.D.   On: 06/19/2018 20:17   Dg Chest Port 1 View  Result Date: 06/19/2018 CLINICAL DATA:  Fall. Sepsis. Cholangiocarcinoma with metastatic disease. EXAM: PORTABLE CHEST 1 VIEW COMPARISON:  One-view chest x-ray 05/12/2018 CT of the chest 02/19/2018 FINDINGS: Heart size is normal. Right IJ Port-A-Cath is  stable. The tip is in the right atrium. Asymmetric interstitial pattern is again noted on the left. Left basilar metastasis is not well seen. Lung volumes are low. Mild pulmonary vascular congestion is present without frank edema. IMPRESSION: 1. Cardiomegaly with mild vascular congestion. 2. Low lung volumes. 3. Stable chronic interstitial changes, asymmetric on the left. Electronically Signed   By: San Morelle M.D.   On: 06/19/2018 04:27   Ct Maxillofacial Wo Contrast  Result Date: 06/19/2018 CLINICAL DATA:  Head trauma, minor. Fall. Cholangiocarcinoma with known metastatic disease. Initial encounter. EXAM: CT HEAD WITHOUT CONTRAST CT MAXILLOFACIAL WITHOUT CONTRAST CT CERVICAL SPINE WITHOUT CONTRAST TECHNIQUE: Multidetector CT imaging of the head, cervical spine, and maxillofacial structures were performed using the standard protocol without intravenous contrast. Multiplanar CT image reconstructions of the cervical spine and maxillofacial structures were also generated. COMPARISON:  CT of the head and cervical spine 05/12/2018. MRI brain 02/26/2018 FINDINGS: CT HEAD FINDINGS Brain: No acute infarct, hemorrhage, or mass lesion is present. No significant white matter lesions are present. The ventricles are of normal size. No significant extraaxial fluid collection is present. The brainstem and cerebellum are within normal limits. Vascular: Atherosclerotic calcifications are present in the cavernous right internal carotid artery. There is no hyperdense vessel. Skull: Calvarium is intact. No focal lytic or blastic lesions are present. CT MAXILLOFACIAL FINDINGS Osseous: No acute or healing facial fractures are present. Nasal bones are intact. Zygomatic arch is normal bilaterally. Orbits: The globes and orbits are within normal limits. Sinuses: The paranasal sinuses and mastoid air cells are clear. Soft tissues: Left infraorbital soft tissue swelling and hematoma is present. There is no significant  laceration. No foreign body is present. No other significant soft tissue injury is present. CT CERVICAL SPINE FINDINGS Alignment: There straightening  of the normal cervical lordosis. Slight kyphosis is present at C4-5. AP alignment is anatomic. Skull base and vertebrae: The craniocervical junction is normal. Vertebral body heights are maintained. No acute or healing fractures are present. Soft tissues and spinal canal: No prevertebral fluid or swelling. No visible canal hematoma. Vascular calcifications are present at the carotid bifurcations, right greater than left. Disc levels: Solid fusion is present C5-6, C6-7, and C7-T1. Adjacent level disease is present at C4-5 with foraminal narrowing bilaterally due to uncovertebral disease. Right foraminal narrowing is present at C3-4 greater than C2-3 due to uncovertebral and facet disease. Upper chest: The lung apices are clear. Right IJ Port-A-Cath is in place. Thoracic inlet is otherwise normal. IMPRESSION: 1. Left infraorbital facial soft tissue swelling without underlying fracture. 2. Normal CT appearance of the brain for age. No acute intracranial abnormality or trauma to the head. 3. No acute or healing facial fractures. 4. No acute trauma to the cervical spine. 5. Cervical spine fusion at C5-6, C6-7, and C7-T1. 6. Adjacent level disease at C4-5 with bilateral foraminal stenosis. Electronically Signed   By: San Morelle M.D.   On: 06/19/2018 06:15   Medications: I have reviewed the patient's current medications.  Assessment/Plan: 1) Metastatic cholangiocarcinoma with ongoing GI bleeding-extensive work-up in the recent past including 3 endoscopies within the last few weeks and a colonoscopy in October of last year.  His varices have been completely obliterated.  No fresh or old blood was seen in the upper GI tract on endoscopy done last night.  He however continues to bleed with his present comorbidities it will be difficult to prep him for colonoscopy  at this time. His CODE STATUS is somewhat unclear to me as well.  I feel his CODE STATUS needs to be clarified if further work-up is to be undertaken considering his multiple comorbidities and poor functional status.  I have discussed this with the patient and he is going to think about what his CODE STATUS should be.  He tells me his wife wants him to be a full code. 2) Cirrhosis of the liver with ascites splenomegaly and varices which have now been obliterated. 3) Abnormal CT scan with wall thickening of the right and transverse colon question infectious versus inflammatory colitis. Plans are to follow the patient serial CBCs for now and transfuse as needed to keep the hemoglobin her hemoglobin about 7 g/dL.  He may need a CTA if the bleeding persists. 4) Sepsis work-up in progress on broad-spectrum antibiotics. 5) Posthemorrhagic anemia-serial CBCs are being done. 6) adult onset diabetes mellitus. 7) S/P fall with soft tissue injury to the face-no fractures on CT; there is no evidence of trauma to the head of the cervical spine.  He does have evidence of cervical spine fusion done in the past.  LOS: 1 day   Ticara Waner 06/20/2018, 11:09 AM

## 2018-06-20 NOTE — Progress Notes (Signed)
NAME:  Matthew Lewis, MRN:  [id number], DOB:  [birth year], LOS: 1 ADMISSION DATE:  06/19/2018, CONSULTATION DATE:  06/20/2018 REFERRING MD:  Hal Hope, CHIEF COMPLAINT:  Sepsis   Brief History    Patient was admitted to the hospital following a fall He had been feeling well prior to the fall, woke up in the middle of the night to get a glass of water, has a student up he felt he may have tripped and fell Does not recollect having any lightheadedness, no tachycardia, no fever, was not feeling ill prior to the fall According to spouse she felt he was not doing well on the day prior to the fall, not eating well He has a history of cholangiocarcinoma-stage IV, history of liver cirrhosis, history of obstructive jaundice with drain placement Past history of falls History of present illness    Prior to the fall was not having any fever, no contact with anybody with a febrile illness No nausea, no vomiting, no diarrhea Denies a cough Denies any shortness of breath Denies any significant abdominal pain or discomfort Past Medical History   Past Medical History:  Diagnosis Date   Abscess of anal and rectal regions    horse shoe abscess   Alcoholic cirrhosis ([place])    Arthritis    "everywhere; especially in my spine"   Cirrhosis ([place])    STAGE 1 CIRRHOSIS PATIENT SEES DR NAMDIGAM FOR   Colon cancer ([place]) 05/2005   TX SURGERY WITH LYMPH NODE REMOVAL   Diabetes mellitus without complication ([place])    type 2   DM type 2 with diabetic peripheral neuropathy ([place])    left foot   Fatigue    Fistula, anal    multiple   History of substance abuse ([place])    last alcohol was 08/2011; last marijuania was 08/2011   Hypertension    no bp meds in last year   PTSD (post-traumatic stress disorder)    "severe" HX OF   Sleep deprivation    Significant Hospital Events   Hypotension    Consults:  GI-GI bleed, EGD was unremarkable Interventional radiology-paracentesis for 1 L of  fluid  Procedures:  Paracentesis  Significant Diagnostic Tests:   CT cervical spine/head IMPRESSION: 1. Left infraorbital facial soft tissue swelling without underlying fracture. 2. Normal CT appearance of the brain for age. No acute intracranial abnormality or trauma to the head. 3. No acute or healing facial fractures. 4. No acute trauma to the cervical spine. 5. Cervical spine fusion at C5-6, C6-7, and C7-T1. 6. Adjacent level disease at C4-5 with bilateral foraminal stenosis.  Chest x-ray IMPRESSION: 1. Cardiomegaly with mild vascular congestion. 2. Low lung volumes. 3. Stable chronic interstitial changes, asymmetric on the left. Micro Data:   Blood cultures 6/12 Urine culture 6/12 Antimicrobials:  Vancomycin 6/11 -6/12 Metronidazole 6/11-6/12 Cefepime 6/11 Meropenem 6/12>>  Interim history/subjective:  On vent No overnight events No evidence of acute bleeding noted from EGD 06/19/2018  Objective   Blood pressure (!) 96/49, pulse 85, temperature 97.9 F (36.6 C), temperature source Oral, resp. rate 19, height 5' 11"  (1.803 m), weight 99.1 kg, SpO2 100 %.    Vent Mode: PRVC FiO2 (%):  [40 %-100 %] 40 % Set Rate:  [16 bmp] 16 bmp Vt Set:  [600 mL] 600 mL PEEP:  [5 cmH20] 5 cmH20 Plateau Pressure:  [17 cmH20-18 cmH20] 17 cmH20   Intake/Output Summary (Last 24 hours) at 06/20/2018 0734 Last data filed at 06/20/2018 0700 Gross per 24  hour  Intake 9488.42 ml  Output 2000 ml  Net 7488.42 ml   Filed Weights   06/19/18 0316 06/20/18 0500  Weight: 93.9 kg 99.1 kg    Examination: General: Middle-aged gentleman does not appear to be in distress HENT: Dry oral mucosa, endotracheal tube in place Lungs: Vernal air entry Cardiovascular: S1-S2 appreciated Abdomen: Bowel sounds appreciated Extremities: No clubbing, no edema Neuro: Awake and alert, interactive GU:   Resolved Hospital Problem list     Assessment & Plan:  Sepsis -Source remains unclear, cultures  negative, ascitic fluid does not appear infected -Continue current antibiotics -Continue meropenem -Fluid resuscitation -Pressors  Fall with back pain -Pain management -No evidence of fracture or lesions on CT abdomen to account for back pain -We will continue his Neurontin and morphine  GI bleed -Lower GI bleed likely -EGD findings noted -Transfuse as needed for hematocrit less than 7  Cholangiocarcinoma -Stage IV disease -2 drains in place -CT scan of the abdomen did not reveal progressive disease  Liver cirrhosis with ascites -Status post paracentesis for 1 L of fluid -Fluid does not appear infected  Anemia -Trend CBC -Transfuse for hematocrit less than 7  Hyponatremia -Resolved  Metabolic acidosis with lactic acidosis -Improved  Diabetes -SSI  History of PTSD History of polysubstance abuse  Goal today would be extubation Pain management Start diet this afternoon  Best practice:  Diet: N.p.o. Pain/Anxiety/Delirium protocol (if indicated): Opiates VAP protocol (if indicated): In place DVT prophylaxis: SCD GI prophylaxis: Protonix Glucose control: SSI Mobility: Bedrest Code Status: Partial code Family Communication:  Disposition: ICU  Labs   CBC: Recent Labs  Lab 06/19/18 0330 06/19/18 1145 06/20/18 0047 06/20/18 0347  WBC 15.7* 11.8*  --  10.7*  NEUTROABS 14.0* 10.2*  --  8.8*  HGB 8.0* 5.9* 8.0* 8.0*  HCT 25.4* 19.1* 25.0* 25.5*  MCV 89.4 91.4  --  89.8  PLT 228 157  --  973    Basic Metabolic Panel: Recent Labs  Lab 06/19/18 0330 06/19/18 1145 06/20/18 0347  NA 127* 130* 139  K 4.0 4.2 3.4*  CL 98 105 113*  CO2 18* 21* 22  GLUCOSE 161* 145* 63*  BUN 12 13 13   CREATININE 0.77 0.76 0.48*  CALCIUM 7.6* 7.2* 7.9*  MG  --   --  1.6*   GFR: Estimated Creatinine Clearance: 111.9 mL/min (A) (by C-G formula based on SCr of 0.48 mg/dL (L)). Recent Labs  Lab 06/19/18 0330 06/19/18 1103 06/19/18 1145 06/20/18 0347  PROCALCITON   --   --  4.79  --   WBC 15.7*  --  11.8* 10.7*  LATICACIDVEN 3.0* 1.3  --  1.2    Liver Function Tests: Recent Labs  Lab 06/19/18 0330 06/19/18 1145 06/20/18 0347  AST 51* 46* 49*  ALT 22 22 23   ALKPHOS 345* 257* 224*  BILITOT 2.9* 3.2* 5.9*  PROT 6.6 5.9* 6.5  ALBUMIN 2.2* 2.2* 2.8*   No results for input(s): LIPASE, AMYLASE in the last 168 hours. No results for input(s): AMMONIA in the last 168 hours.  ABG    Component Value Date/Time   PHART 7.430 06/19/2018 1935   PCO2ART 32.2 06/19/2018 1935   PO2ART 252 (H) 06/19/2018 1935   HCO3 21.1 06/19/2018 1935   TCO2 16 (L) 02/24/2018 0309   ACIDBASEDEF 2.2 (H) 06/19/2018 1935   O2SAT 99.7 06/19/2018 1935     Coagulation Profile: Recent Labs  Lab 06/19/18 0330 06/19/18 1145 06/20/18 0347  INR 1.5* 1.7* 1.6*  Cardiac Enzymes: No results for input(s): CKTOTAL, CKMB, CKMBINDEX, TROPONINI in the last 168 hours.  HbA1C: Hgb A1c MFr Bld  Date/Time Value Ref Range Status  11/09/2017 07:52 AM 4.9 4.8 - 5.6 % Final    Comment:    (NOTE) Pre diabetes:          5.7%-6.4% Diabetes:              >6.4% Glycemic control for   <7.0% adults with diabetes   10/27/2017 02:49 AM 6.5 (H) 4.8 - 5.6 % Final    Comment:    (NOTE)         Prediabetes: 5.7 - 6.4         Diabetes: >6.4         Glycemic control for adults with diabetes: <7.0     CBG: Recent Labs  Lab 06/19/18 0826 06/19/18 1205 06/19/18 1718 06/19/18 2228 06/19/18 2258  GLUCAP 140* 134* 81 45* 125*    Review of Systems:   Review of Systems  Unable to perform ROS: Intubated   Past Medical History  He,  has a past medical history of Abscess of anal and rectal regions, Alcoholic cirrhosis ([place]), Arthritis, Cirrhosis ([place]), Colon cancer ([place]) (05/2005), Diabetes mellitus without complication ([place]), DM type 2 with diabetic peripheral neuropathy ([place]), Fatigue, Fistula, anal, History of substance abuse ([place]), Hypertension, PTSD (post-traumatic stress  disorder), and Sleep deprivation.   Surgical History    Past Surgical History:  Procedure Laterality Date   ABSCESS DRAINAGE     "probably 15-20 so far at least; rectal"   ANAL FISTULECTOMY  12/13/1998   ANTERIOR FUSION CERVICAL SPINE  2010   "triple"; "did 2 surgeries on the same day in 1443 due to complications"   CORONARY ANGIOPLASTY  2001   detached muscle  2010   right chest; "after cervical fusion complications"   ELBOW SURGERY  2007   "cut out part of a muscle"; left   ESOPHAGEAL BANDING  02/25/2018   Procedure: ESOPHAGEAL BANDING;  Surgeon: Ladene Artist, MD;  Location: [facility];  Service: Endoscopy;;   ESOPHAGEAL BANDING  05/12/2018   Procedure: ESOPHAGEAL BANDING;  Surgeon: Gatha Mayer, MD;  Location: [facility];  Service: Endoscopy;;   ESOPHAGEAL BANDING  05/14/2018   Procedure: ESOPHAGEAL BANDING;  Surgeon: Yetta Flock, MD;  Location[facility];  Service: Gastroenterology;;   ESOPHAGEAL BANDING  06/11/2018   Procedure: ESOPHAGEAL BANDING;  Surgeon: Mauri Pole, MD;  Location: [facility];  Service: Endoscopy;;   ESOPHAGOGASTRODUODENOSCOPY N/A 05/12/2018   Procedure: ESOPHAGOGASTRODUODENOSCOPY (EGD);  Surgeon: Gatha Mayer, MD;  Location[facility];  Service: Endoscopy;  Laterality: N/A;   ESOPHAGOGASTRODUODENOSCOPY (EGD) WITH PROPOFOL N/A 02/25/2018   Procedure: ESOPHAGOGASTRODUODENOSCOPY (EGD) WITH PROPOFOL;  Surgeon: Ladene Artist, MD;  [facility];  Service: Endoscopy;  Laterality: N/A;   ESOPHAGOGASTRODUODENOSCOPY (EGD) WITH PROPOFOL N/A 05/14/2018   Procedure: ESOPHAGOGASTRODUODENOSCOPY (EGD) WITH PROPOFOL;  Surgeon: Yetta Flock, MD;  Location: [facility];  Service: Gastroenterology;  Laterality: N/A;   ESOPHAGOGASTRODUODENOSCOPY (EGD) WITH PROPOFOL N/A 06/11/2018   Procedure: ESOPHAGOGASTRODUODENOSCOPY (EGD) WITH PROPOFOL;  Surgeon: Mauri Pole, MD;  Location: [facility];  Service: Endoscopy;   Laterality: N/A;   EXAMINATION UNDER ANESTHESIA  03/22/2005   fistula   EXAMINATION UNDER ANESTHESIA  06/06/2011   Procedure: EXAM UNDER ANESTHESIA;  Surgeon: Stark Klein, MD;  Location: [facility];  Service: General;  Laterality: N/A;   HEMICOLECTOMY  06/06/2005   left   INCISION AND  DRAINAGE PERIRECTAL ABSCESS  03/30/2005   INCISION AND DRAINAGE PERIRECTAL ABSCESS  12/13/2004   INCISION AND DRAINAGE PERIRECTAL ABSCESS  07/14/2001   INCISION AND DRAINAGE PERIRECTAL ABSCESS  08/12/2010   horseshoe abscess; Dr Redmond Pulling   INCISION AND DRAINAGE PERIRECTAL ABSCESS  06/06/2011   Procedure: IRRIGATION AND DEBRIDEMENT PERIRECTAL ABSCESS;  Surgeon: Stark Klein, MD;  Location: [facility];  Service: General;  Laterality: N/A;   INCISION AND DRAINAGE PERIRECTAL ABSCESS N/A 02/24/2017   Procedure: IRRIGATION AND DEBRIDEMENT PERIRECTAL ABSCESS;  Surgeon: Ralene Ok, MD;  Location: [facility];  Service: General;  Laterality: N/A;   IR CATHETER TUBE CHANGE  02/19/2018   IR CHOLANGIOGRAM EXISTING TUBE  12/23/2017   IR CHOLANGIOGRAM EXISTING TUBE  01/02/2018   IR CHOLANGIOGRAM EXISTING TUBE  05/04/2018   IR EXCHANGE BILIARY DRAIN  11/07/2017   IR EXCHANGE BILIARY DRAIN  12/09/2017   IR EXCHANGE BILIARY DRAIN  02/03/2018   IR EXCHANGE BILIARY DRAIN  02/03/2018   IR EXCHANGE BILIARY DRAIN  04/03/2018   IR EXCHANGE BILIARY DRAIN  04/03/2018   IR EXCHANGE BILIARY DRAIN  04/23/2018   IR EXCHANGE BILIARY DRAIN  04/28/2018   IR EXCHANGE BILIARY DRAIN  04/28/2018   IR FLUORO RM 30-60 MIN  04/15/2018   IR IMAGING GUIDED PORT INSERTION  12/09/2017   IR INT EXT BILIARY DRAIN WITH CHOLANGIOGRAM  10/26/2017   IR INT EXT BILIARY DRAIN WITH CHOLANGIOGRAM  12/23/2017   IR PARACENTESIS  02/25/2018   IR PARACENTESIS  05/26/2018   IR PARACENTESIS  06/02/2018   IR PARACENTESIS  06/08/2018   IR PARACENTESIS  06/15/2018   IR PATIENT EVAL TECH 0-60 MINS  12/02/2017   IR RADIOLOGIST EVAL & MGMT  11/06/2017   IR RADIOLOGIST  EVAL & MGMT  01/05/2018   LUMBAR DISC SURGERY  1987   PERCUTANEOUS PINNING PHALANX FRACTURE OF HAND  ~ 2008   "plates in 2 places"; right   PLACEMENT OF SETON N/A 09/05/2017   Procedure: PLACEMENT OF SETON;  Surgeon: Leighton Ruff, MD;  Location: [facility];  Service: General;  Laterality: N/A;   RADIOLOGY WITH ANESTHESIA N/A 10/25/2017   Procedure: MRI WITH ANESTHESIA;  Surgeon: Radiologist, Medication, MD;  Location: [place];  Service: Radiology;  Laterality: N/A;   THORACIC DISCECTOMY  1990's     Social History   reports that he quit smoking about 27 years ago. His smoking use included cigarettes. He has a 10.00 pack-year smoking history. He has never used smokeless tobacco. He reports previous alcohol use. He reports previous drug use. Drugs: Oxycodone and Marijuana.   Family History   His family history includes Cancer in his mother and sister; Heart disease in his brother, father, and mother.   Allergies Allergies  Allergen Reactions   Morphine And Related Other (See Comments)    Patient is recovering from drug addiction and wants to avoid any narcotics    The patient is critically ill with multiple organ system failure and requires high complexity decision making for assessment and support, frequent evaluation and titration of therapies, advanced monitoring, review of radiographic studies and interpretation of complex data.    Critical Care Time devoted to patient care services, exclusive of separately billable procedures, described in this note is 30 minutes.  Sherrilyn Rist, MD [place], PCCM Cell: [phone]

## 2018-06-20 NOTE — Procedures (Signed)
Extubation Procedure Note  Patient Details:   Name: [name] DOB: Oct 22, 1955 MRN: [id number]   Airway Documentation:    Vent end date: 06/20/18 Vent end time: 0755   Evaluation  O2 sats: stable throughout Complications: No apparent complications Patient did tolerate procedure well. Bilateral Breath Sounds: Clear   Pt placed on 2 lpm, SAT 100%, Pt able to speak. Nelida Gores, [place] 06/20/2018, 8:01 AM

## 2018-06-21 ENCOUNTER — Encounter (HOSPITAL_COMMUNITY): Payer: Self-pay | Admitting: Gastroenterology

## 2018-06-21 DIAGNOSIS — R6521 Severe sepsis with septic shock: Secondary | ICD-10-CM

## 2018-06-21 DIAGNOSIS — A419 Sepsis, unspecified organism: Principal | ICD-10-CM

## 2018-06-21 DIAGNOSIS — C787 Secondary malignant neoplasm of liver and intrahepatic bile duct: Secondary | ICD-10-CM

## 2018-06-21 LAB — BASIC METABOLIC PANEL
Anion gap: 3 — ABNORMAL LOW (ref 5–15)
BUN: 10 mg/dL (ref 8–23)
CO2: 21 mmol/L — ABNORMAL LOW (ref 22–32)
Calcium: 7.8 mg/dL — ABNORMAL LOW (ref 8.9–10.3)
Chloride: 113 mmol/L — ABNORMAL HIGH (ref 98–111)
Creatinine, Ser: 0.56 mg/dL — ABNORMAL LOW (ref 0.61–1.24)
GFR calc Af Amer: >60 mL/min (ref >60)
GFR calc non Af Amer: >60 mL/min (ref >60)
Glucose, Bld: 104 mg/dL — ABNORMAL HIGH (ref 70–99)
Potassium: 3.7 mmol/L (ref 3.5–5.1)
Sodium: 137 mmol/L (ref 135–145)

## 2018-06-21 LAB — CORTISOL: Cortisol, Plasma: 12.3 ug/dL

## 2018-06-21 LAB — GLUCOSE, CAPILLARY
Glucose-Capillary: 90 mg/dL (ref 70–99)
Glucose-Capillary: 155 mg/dL — ABNORMAL HIGH (ref 70–99)
Glucose-Capillary: 155 mg/dL — ABNORMAL HIGH (ref 70–99)
Glucose-Capillary: 130 mg/dL — ABNORMAL HIGH (ref 70–99)

## 2018-06-21 LAB — CBC
HCT: 23.8 % — ABNORMAL LOW (ref 39.0–52.0)
Hemoglobin: 7.5 g/dL — ABNORMAL LOW (ref 13.0–17.0)
MCH: 28.7 pg (ref 26.0–34.0)
MCHC: 31.5 g/dL (ref 30.0–36.0)
MCV: 91.2 fL (ref 80.0–100.0)
Platelets: 130 10*3/uL — ABNORMAL LOW (ref 150–400)
RBC: 2.61 MIL/uL — ABNORMAL LOW (ref 4.22–5.81)
RDW: 17.1 % — ABNORMAL HIGH (ref 11.5–15.5)
WBC: 12.1 10*3/uL — ABNORMAL HIGH (ref 4.0–10.5)
nRBC: 0 % (ref 0.0–0.2)

## 2018-06-21 LAB — MAGNESIUM: Magnesium: 2 mg/dL (ref 1.7–2.4)

## 2018-06-21 MED ORDER — INSULIN DETEMIR 100 UNIT/ML ~~LOC~~ SOLN
20.0000 [IU] | Freq: Every day | SUBCUTANEOUS | Status: DC
  Administered 2018-06-21 – 2018-06-26 (×6): 20 [IU] via SUBCUTANEOUS
  Filled 2018-06-21 (×6): qty 0.2

## 2018-06-21 MED ORDER — PANTOPRAZOLE SODIUM 40 MG IV SOLR
40.0000 mg | Freq: Two times a day (BID) | INTRAVENOUS | Status: DC
  Administered 2018-06-21 – 2018-06-26 (×12): 40 mg via INTRAVENOUS
  Filled 2018-06-21 (×12): qty 40

## 2018-06-21 NOTE — Progress Notes (Signed)
CROSS COVER [facility]-GI Subjective: Patient seems to be relatively stable today no acute GI issues he had a BM this morning with some old blood in it.  He has had some abdominal pain and distention but denies having any nausea vomiting.  He is being referred to the palliative care service and has decided to be DNR.  Objective: Vital signs in last 24 hours: Temp:  [97.7 F (36.5 C)-99 F (37.2 C)] 97.7 F (36.5 C) (06/14 0857) Resp:  [13-27] 13 (06/14 0800) BP: (87-122)/(51-82) 95/60 (06/14 0800) SpO2:  [93 %-100 %] 100 % (06/14 0800) Weight:  [100.1 kg] 100.1 kg (06/14 0500) Last BM Date: 06/20/18  Intake/Output from previous day: 06/13 0701 - 06/14 0700 In: 4206.3 [P.O.:960; I.V.:2340.8; IV Piggyback:905.5] Out: 0960 [Urine:1300; Drains:85] Intake/Output this shift: No intake/output data recorded.  General appearance: cooperative, appears stated age, fatigued, icteric, mild distress and pale Resp: clear to auscultation bilaterally Cardio: regular rate and rhythm, S1, S2 normal, no murmur, click, rub or gallop GI: soft, distended, diffusely tender with hypoactive bowel sounds normal; no masses,  no organomegaly Extremities: extremities normal, atraumatic, no cyanosis or edema  Lab Results: Recent Labs    06/19/18 1145 06/20/18 0047 06/20/18 0347 06/21/18 0553  WBC 11.8*  --  10.7* 12.1*  HGB 5.9* 8.0* 8.0* 7.5*  HCT 19.1* 25.0* 25.5* 23.8*  PLT 157  --  169 130*   BMET Recent Labs    06/19/18 0330 06/19/18 1145 06/20/18 0347  NA 127* 130* 139  K 4.0 4.2 3.4*  CL 98 105 113*  CO2 18* 21* 22  GLUCOSE 161* 145* 63*  BUN 12 13 13   CREATININE 0.77 0.76 0.48*  CALCIUM 7.6* 7.2* 7.9*   LFT Recent Labs    06/20/18 0347  PROT 6.5  ALBUMIN 2.8*  AST 49*  ALT 23  ALKPHOS 224*  BILITOT 5.9*   PT/INR Recent Labs    06/19/18 1145 06/20/18 0347  LABPROT 19.4* 18.6*  INR 1.7* 1.6*   Studies/Results: Ct Abdomen Pelvis W Contrast  Result Date:  06/19/2018 CLINICAL DATA:  Abdominal pain. History of cholangiocarcinoma. Possible sepsis. EXAM: CT ABDOMEN AND PELVIS WITH CONTRAST TECHNIQUE: Multidetector CT imaging of the abdomen and pelvis was performed using the standard protocol following bolus administration of intravenous contrast. CONTRAST:  182mL OMNIPAQUE IOHEXOL 300 MG/ML  SOLN COMPARISON:  CT scan of February 19, 2018. FINDINGS: Lower chest: Right lower lobe pneumonia or atelectasis is noted. Stable rounded density is noted in left posterior costophrenic sulcus concerning for metastatic disease. Small left pleural effusion is noted. Hepatobiliary: Air and probable sludge is noted within gallbladder lumen. Hepatic cirrhosis is noted. Right hepatic pneumobilia is noted. Multiple low densities are noted throughout the liver which may represent metastatic disease. Stable position of 2 right external biliary drains are noted. Pancreas: Unremarkable. No pancreatic ductal dilatation or surrounding inflammatory changes. Spleen: Stable splenomegaly is noted. No focal abnormality is noted. Adrenals/Urinary Tract: Stable bilateral adrenal gland enlargement is noted. Bilateral nonobstructive nephrolithiasis is noted. No hydronephrosis or renal obstruction is noted. Urinary bladder is unremarkable. Stomach/Bowel: The stomach appears normal. Stool is noted throughout the colon. There is no evidence of bowel obstruction. The appendix is not clearly visualized. Mild wall and fold thickening is seen involving the right and transverse colon which may represent infectious or inflammatory colitis. Vascular/Lymphatic: Aortic atherosclerosis is noted. Grossly stable 16 mm right periaortic lymph node is noted. Stable 1.6 cm left periaortic lymph node is noted. Reproductive: Prostate is unremarkable. Other:  Mild ascites is noted. Musculoskeletal: No acute or significant osseous findings. IMPRESSION: Continued presence of probable multiple hepatic metastatic lesions. Hepatic  cirrhosis is noted, with continued presence of 2 external biliary drainage catheters. Right hepatic pneumobilia is noted. Stable rounded density noted in left posterior costophrenic sulcus concerning for metastatic disease. Interval development of mild ascites. Wall thickening of transverse and right colon is noted which may represent infectious or inflammatory colitis, or potentially may be due to ascites. Stable splenomegaly. Stable bilateral adrenal gland enlargement is noted consistent with metastatic disease. Stable periaortic adenopathy is noted which may be neoplastic or inflammatory in etiology. Aortic Atherosclerosis (ICD10-I70.0). Electronically Signed   By: Marijo Conception M.D.   On: 06/19/2018 16:37   US Paracentesis  Result Date: 06/19/2018 INDICATION: Patient with history of intrahepatic cholangiocarcinoma, NASH cirrhosis, recurrent ascites, fever/sepsis, anemia; request made for diagnostic paracentesis up to 1 liter. EXAM: ULTRASOUND GUIDED DIAGNOSTIC PARACENTESIS MEDICATIONS: None COMPLICATIONS: None immediate. PROCEDURE: Informed written consent was obtained from the patient after a discussion of the risks, benefits and alternatives to treatment. A timeout was performed prior to the initiation of the procedure. Initial ultrasound scanning demonstrates a large amount of ascites within the left lower abdominal quadrant. The left lower abdomen was prepped and draped in the usual sterile fashion. 1% lidocaine was used for local anesthesia. Following this, a 19 gauge, 10-cm, Yueh catheter was introduced. An ultrasound image was saved for documentation purposes. The paracentesis was performed. The catheter was removed and a dressing was applied. The patient tolerated the procedure well without immediate post procedural complication. FINDINGS: A total of approximately 1 liter of yellow fluid was removed. Samples were sent to the laboratory as requested by the clinical team. IMPRESSION: Successful  ultrasound-guided diagnostic paracentesis yielding 1 liter of peritoneal fluid. Read by: Rowe Robert, PA-C Electronically Signed   By: Aletta Edouard M.D.   On: 06/19/2018 16:12   Portable Chest X-ray  Result Date: 06/19/2018 CLINICAL DATA:  ET tube placement EXAM: PORTABLE CHEST 1 VIEW COMPARISON:  06/19/2018 FINDINGS: Endotracheal tube is 4 cm above the carina. Right Port-A-Cath is unchanged with the tip in the right atrium. Heart is normal size. Low lung volumes. Increasing perihilar opacities and bibasilar opacities which could reflect edema and/or atelectasis. No visible significant effusions or acute bony abnormality. IMPRESSION: Endotracheal tube 4 cm above the carina. Increasing perihilar and bibasilar opacities which could reflect edema or atelectasis. Low lung volumes. Electronically Signed   By: Rolm Baptise M.D.   On: 06/19/2018 20:17   Medications: I have reviewed the patient's current medications.  Assessment/Plan: 1) Septic shock etiology seems unclear at this time cultures negative. on meropenem 2) GI bleed-currently stable.  Hemoglobin is at 7.5 g/dL EGD was essentially unrevealing with no evidence of varices.  Continue supportive care 3) Cirrhosis with ascites requiring weekly paracentesis. 4) Stage IV cholangiocarcinoma-2 drains in place. 5) Palliative care consult pending today-we will await their recommendations.  LOS: 2 days   Hamsa Laurich 06/21/2018, 9:15 AM

## 2018-06-21 NOTE — Progress Notes (Signed)
NAME:  Matthew Lewis, MRN:  [id number], DOB:  05-01-1955, LOS: 2 ADMISSION DATE:  06/19/2018, CONSULTATION DATE:  06/20/2018 REFERRING MD:  Hal Hope, CHIEF COMPLAINT:  Sepsis   Brief History    63 year old obese man with metastatic cholangiocarcinoma admitted 6/12 following a fall He underwent EGD 2 weeks ago with variceal banding.  He was felt to have sepsis, intra-abdominal source with concern for upper GI bleed  He has a history of cholangiocarcinoma-stage IV, history of liver cirrhosis, history of obstructive jaundice with drain placement Past history of falls  Past Medical History   Past Medical History:  Diagnosis Date  . Abscess of anal and rectal regions    horse shoe abscess  . Alcoholic cirrhosis ([place])   . Arthritis    "everywhere; especially in my spine"  . Cirrhosis ([place])    STAGE 1 CIRRHOSIS PATIENT SEES DR NAMDIGAM FOR  . Colon cancer ([place]) 05/2005   TX SURGERY WITH LYMPH NODE REMOVAL  . Diabetes mellitus without complication ([place])    type 2  . DM type 2 with diabetic peripheral neuropathy ([facility])    left foot  . Fatigue   . Fistula, anal    multiple  . History of substance abuse ([place])    last alcohol was 08/2011; last marijuania was 08/2011  . Hypertension    no bp meds in last year  . PTSD (post-traumatic stress disorder)    "severe" HX OF  . Sleep deprivation    Significant Hospital Events   Hypotension    Consults:  GI- Interventional radiology-paracentesis for 1 L of fluid  Procedures:  6/12 Paracentesis 1L 6/12 ETT >> 6/13 RT fem CVL 6/12 >> EGD 6/12 >> no active bleeding  Significant Diagnostic Tests:   CT cervical spine/head IMPRESSION: 1. Left infraorbital facial soft tissue swelling without underlying fracture. 2. Normal CT appearance of the brain for age. No acute intracranial abnormality or trauma to the head. 3. No acute or healing facial fractures. 4. No acute trauma to the cervical spine. 5. Cervical spine fusion at C5-6,  C6-7, and C7-T1. 6. Adjacent level disease at C4-5 with bilateral foraminal stenosis.   Micro Data:   Blood cultures 6/12 >> ng Urine culture 6/12 >> ng Antimicrobials:  Vancomycin 6/11 -6/12 Metronidazole 6/11-6/12 Cefepime 6/11 Meropenem 6/12>>  Interim history/subjective:   He was extubated sick/30 Remains critically ill on Neo-Synephrine drip Lying supine due to chronic back pain  Objective   Blood pressure 95/60, pulse 85, temperature 97.7 F (36.5 C), temperature source Oral, resp. rate 13, height 5' 11"  (1.803 m), weight 100.1 kg, SpO2 100 %.        Intake/Output Summary (Last 24 hours) at 06/21/2018 0905 Last data filed at 06/21/2018 0133 Gross per 24 hour  Intake 4101.57 ml  Output 1385 ml  Net 2716.57 ml   Filed Weights   06/19/18 0316 06/20/18 0500 06/21/18 0500  Weight: 93.9 kg 99.1 kg 100.1 kg    Examination: General: Middle-aged obese man, no distress HENT: Dry oral mucosa, mild pallor, mild icterus Lungs: Creased breath sounds bilateral Cardiovascular: S1-S2 appreciated Abdomen: Bowel sounds appreciated, mild distention, has 2 biliary drains-right one has bleeding around, both with dark biliary drainage Extremities: No clubbing, 1+ edema Neuro: Awake and alert, interactive GU:   Resolved Hospital Problem list    Hyponatremia  Metabolic acidosis with lactic acidosis  Assessment & Plan:  Septic shock -Source remains unclear, cultures negative, ascitic fluid does not appear infected -Continue meropenem -Discontinue albumin -Check  random cortisol and consider stress dose steroids if remains on pressors next 24 hours  Fall with back pain -Pain management -No evidence of fracture or lesions on CT abdomen to account for back pain -We will continue his Neurontin and morphine  GI bleed -Lower GI bleed likely -EGD neg, varix obliterated -We will change Protonix to every 12 and discontinue octreotide -If further bleeding may consider bleeding  scan to check for lower GI bleeding, difficult prep for colonoscopy -Transfuse as needed for hematocrit less than 7  Cholangiocarcinoma -Stage IV disease -2 drains in place -CT scan of the abdomen did not reveal progressive disease  Liver cirrhosis with ascites -Status post paracentesis for 1 L of fluid -He undergoes paracentesis weekly and will perform again this week  Anemia -Trend CBC -Transfuse for hematocrit less than 7   Diabetes -SSI -Due to episodes of hypoglycemia, will decrease a.m. dose of Levemir to 20 units  History of PTSD History of polysubstance abuse  DNR status verified, okay for pressors, will involve palliative care for further goals of care.  He is agreeable but he feels that his family would want him to have other interventions.  He would be agreeable to hospice care  Best practice:  Diet: N.p.o. Pain/Anxiety/Delirium protocol (if indicated): Opiates VAP protocol (if indicated): In place DVT prophylaxis: SCD GI prophylaxis: Protonix Glucose control: SSI Mobility: Bedrest Code Status: Partial code Family Communication: None Disposition: ICU  The patient is critically ill with multiple organ systems failure and requires high complexity decision making for assessment and support, frequent evaluation and titration of therapies, application of advanced monitoring technologies and extensive interpretation of multiple databases. Critical Care Time devoted to patient care services described in this note independent of APP/resident  time is 35 minutes.   Kara Mead MD. Shade Flood. [facility] Pager [phone] If no response call [phone]   06/21/2018

## 2018-06-22 ENCOUNTER — Inpatient Hospital Stay: Payer: Medicare Other

## 2018-06-22 ENCOUNTER — Encounter (HOSPITAL_COMMUNITY): Payer: Self-pay | Admitting: Hematology

## 2018-06-22 ENCOUNTER — Inpatient Hospital Stay: Payer: Medicare Other | Admitting: Nurse Practitioner

## 2018-06-22 ENCOUNTER — Inpatient Hospital Stay: Payer: Self-pay

## 2018-06-22 ENCOUNTER — Inpatient Hospital Stay (HOSPITAL_COMMUNITY): Admission: RE | Admit: 2018-06-22 | Payer: Medicare Other | Source: Ambulatory Visit

## 2018-06-22 DIAGNOSIS — Z7189 Other specified counseling: Secondary | ICD-10-CM

## 2018-06-22 DIAGNOSIS — D62 Acute posthemorrhagic anemia: Secondary | ICD-10-CM

## 2018-06-22 DIAGNOSIS — Z515 Encounter for palliative care: Secondary | ICD-10-CM

## 2018-06-22 LAB — BASIC METABOLIC PANEL
Anion gap: 4 — ABNORMAL LOW (ref 5–15)
BUN: 12 mg/dL (ref 8–23)
CO2: 22 mmol/L (ref 22–32)
Calcium: 7.8 mg/dL — ABNORMAL LOW (ref 8.9–10.3)
Chloride: 109 mmol/L (ref 98–111)
Creatinine, Ser: 0.56 mg/dL — ABNORMAL LOW (ref 0.61–1.24)
GFR calc Af Amer: >60 mL/min (ref >60)
GFR calc non Af Amer: >60 mL/min (ref >60)
Glucose, Bld: 109 mg/dL — ABNORMAL HIGH (ref 70–99)
Potassium: 3.9 mmol/L (ref 3.5–5.1)
Sodium: 135 mmol/L (ref 135–145)

## 2018-06-22 LAB — GLUCOSE, CAPILLARY
Glucose-Capillary: 92 mg/dL (ref 70–99)
Glucose-Capillary: 96 mg/dL (ref 70–99)
Glucose-Capillary: 142 mg/dL — ABNORMAL HIGH (ref 70–99)
Glucose-Capillary: 134 mg/dL — ABNORMAL HIGH (ref 70–99)

## 2018-06-22 LAB — CBC
HCT: 22.7 % — ABNORMAL LOW (ref 39.0–52.0)
HCT: 25.6 % — ABNORMAL LOW (ref 39.0–52.0)
Hemoglobin: 7 g/dL — ABNORMAL LOW (ref 13.0–17.0)
Hemoglobin: 8.2 g/dL — ABNORMAL LOW (ref 13.0–17.0)
MCH: 28 pg (ref 26.0–34.0)
MCH: 28.9 pg (ref 26.0–34.0)
MCHC: 30.8 g/dL (ref 30.0–36.0)
MCHC: 32 g/dL (ref 30.0–36.0)
MCV: 90.8 fL (ref 80.0–100.0)
MCV: 90.1 fL (ref 80.0–100.0)
Platelets: 139 10*3/uL — ABNORMAL LOW (ref 150–400)
Platelets: 171 10*3/uL (ref 150–400)
RBC: 2.5 MIL/uL — ABNORMAL LOW (ref 4.22–5.81)
RBC: 2.84 MIL/uL — ABNORMAL LOW (ref 4.22–5.81)
RDW: 17.2 % — ABNORMAL HIGH (ref 11.5–15.5)
RDW: 17 % — ABNORMAL HIGH (ref 11.5–15.5)
WBC: 10.8 10*3/uL — ABNORMAL HIGH (ref 4.0–10.5)
WBC: 14 10*3/uL — ABNORMAL HIGH (ref 4.0–10.5)
nRBC: 0 % (ref 0.0–0.2)
nRBC: 0 % (ref 0.0–0.2)

## 2018-06-22 LAB — GRAM STAIN

## 2018-06-22 LAB — MAGNESIUM: Magnesium: 1.8 mg/dL (ref 1.7–2.4)

## 2018-06-22 LAB — PHOSPHORUS: Phosphorus: 1.9 mg/dL — ABNORMAL LOW (ref 2.5–4.6)

## 2018-06-22 LAB — PREPARE RBC (CROSSMATCH)

## 2018-06-22 LAB — PATHOLOGIST SMEAR REVIEW

## 2018-06-22 MED ORDER — SODIUM CHLORIDE 0.9% IV SOLUTION
Freq: Once | INTRAVENOUS | Status: AC
  Administered 2018-06-22: 11:00:00 via INTRAVENOUS

## 2018-06-22 MED ORDER — SODIUM CHLORIDE 0.9 % IV SOLN
INTRAVENOUS | Status: DC | PRN
  Administered 2018-06-22: 500 mL via INTRAVENOUS

## 2018-06-22 MED ORDER — POTASSIUM PHOSPHATES 15 MMOLE/5ML IV SOLN
30.0000 mmol | Freq: Once | INTRAVENOUS | Status: AC
  Administered 2018-06-22: 30 mmol via INTRAVENOUS
  Filled 2018-06-22: qty 10

## 2018-06-22 MED ORDER — FLUDROCORTISONE ACETATE 0.1 MG PO TABS
0.1000 mg | ORAL_TABLET | Freq: Every day | ORAL | Status: DC
  Administered 2018-06-22 – 2018-06-26 (×5): 0.1 mg via ORAL
  Filled 2018-06-22 (×6): qty 1

## 2018-06-22 MED ORDER — HYDROCORTISONE NA SUCCINATE PF 100 MG IJ SOLR
50.0000 mg | Freq: Two times a day (BID) | INTRAMUSCULAR | Status: DC
  Administered 2018-06-22 – 2018-06-23 (×4): 50 mg via INTRAVENOUS
  Filled 2018-06-22 (×4): qty 2

## 2018-06-22 MED ORDER — MIDODRINE HCL 5 MG PO TABS
5.0000 mg | ORAL_TABLET | Freq: Three times a day (TID) | ORAL | Status: DC
  Administered 2018-06-22 – 2018-06-23 (×3): 5 mg via ORAL
  Filled 2018-06-22 (×3): qty 1

## 2018-06-22 NOTE — Progress Notes (Addendum)
HEMATOLOGY-ONCOLOGY PROGRESS NOTE  SUBJECTIVE: Results of upper endoscopy reviewed and noted.  Patient reports that he is not having any bleeding.  Nurse reports that his stools are still black however.  He reports his ongoing abdominal pain which is unchanged.  Afebrile.  Remains on phenylephrine.  Palliative care will see the patient later today to discuss possible hospice upon discharge.  No other complaints noted this morning.  Oncology History Overview Note  Cancer Staging Intrahepatic cholangiocarcinoma ([place]) Staging form: Intrahepatic Bile Duct, AJCC 8th Edition - Clinical stage from 10/29/2017: Stage IV (cT2, cN1, pM1) - Signed by [name], MD on 11/06/2017     Malignant neoplasm metastatic to adrenal gland [facility])   Initial Diagnosis   Malignant neoplasm metastatic to adrenal gland ([place])   10/23/2017 Imaging   ABD US IMPRESSION: Cirrhotic appearance of the liver.  2.6 cm questionable ill-defined mass versus area of focal parenchymal heterogeneity in segment 6 of the liver. If further imaging evaluation is desired, liver protocol abdominal MRI should be considered.  Splenomegaly.  Apparent thickening of the gallbladder wall, without evidence of cholelithiasis, and with negative sonographic Murphy's sign. The findings are favored to represent gallbladder wall edema secondary to systemic causes such as hyperproteinemia rather than acute cholecystitis. Please correlate clinically.    10/25/2017 Imaging   MRI IMPRESSION: 1. Advanced changes of cirrhosis. Interval development of multifocal enhancing liver lesions, which is concerning for multicentric hepatocellular carcinoma involving both lobes of liver. 2. Lesion within segment 4 B obstructs the right medial hepatic duct and left main duct resulting in moderate to marked biliary ductal dilatation within the left lobe and dome of liver. There may be partial obstruction of the right lateral duct with mild dilatation. 3. Tumor  thrombus within the left branch of the portal vein likely secondary to segment 4 B tumor. 4. Bilateral adrenal metastasis and retroperitoneal nodal metastasis. 5. Stigmata of portal venous hypertension including varices, ascites and splenomegaly. 6. These results will be called to the ordering clinician or representative by the Radiologist Assistant, and communication documented in the PACS or zVision Dashboard.   10/26/2017 Procedure   IMPRESSION: Status post formal PTC and internal/external biliary drainage.   10/27/2017 Imaging   IMPRESSION: Changes of cirrhosis. Multiple hepatic lesions are noted concerning for multifocal hepatoma or metastases. Biliary drain is in place with the tip in the distal common bile duct. There is mild intrahepatic biliary ductal dilatation. Bilateral adrenal nodules and masses, right larger than left compatible with metastases. Retroperitoneal adenopathy. Large stool burden with mild gaseous distention of the colon. Aortic atherosclerosis.    10/29/2017 Pathology Results   Diagnosis Soft Tissue Needle Core Biopsy, right adrenal mass - METASTATIC ADENOCARCINOMA, SEE COMMENT. Microscopic Comment There are malignant glands with central necrosis. Immunohistochemistry is positive for cytokeratin 20 and CDX-2. Cytokeratin 7, TTF-1 and NapsinA are negative. The morphology and immunoprofile favor a colorectal primary over pancreaticobiliary. [name] has reviewed the case. [name] was notified on 10/31/2017.   11/04/2017 Procedure   Colonoscopy impression - Seton in place found on digital rectal exam. - Congested mucosa in the rectum, in the sigmoid colon and in the descending colon. - A few diminutive polyps in the rectum and in the sigmoid colon. No colon or rectal mass identified. - No specimens collected.   02/19/2018 Imaging   CT CAP IMPRESSION: 1. Slight interval increase in size of left adrenal gland metastasis. Similar-appearing right adrenal  gland metastasis. 2. While difficult to measure given ill-defined nature, infiltrative  mass within the left hepatic lobe is grossly similar when compared to prior exam. Additional lesions within the liver are grossly similar. 3. Interval insertion of biliary drain within the left hepatic lobe with resultant decompression of the left hepatic lobe biliary system. 4. Similar-appearing sharply marginated consolidation within the right lower lobe which may represent infectious/inflammatory process or metastatic disease. 5. Large amount of stool throughout the colon as can be seen with constipation. 6. Persistent left hepatic lobe portal venous thrombosis. 7. Splenomegaly. 8. New moderate volume ascites.   Metastatic adenocarcinoma to liver [facility])  10/27/2017 Imaging   10/27/2017 CT AP IMPRESSION: Changes of cirrhosis. Multiple hepatic lesions are noted concerning for multifocal hepatoma or metastases.  Biliary drain is in place with the tip in the distal common bile duct. There is mild intrahepatic biliary ductal dilatation.  Bilateral adrenal nodules and masses, right larger than left compatible with metastases.  Retroperitoneal adenopathy.  Large stool burden with mild gaseous distention of the colon.  Aortic atherosclerosis.   10/29/2017 Cancer Staging   Staging form: Intrahepatic Bile Duct, AJCC 8th Edition - Clinical stage from 10/29/2017: Stage IV (cT2, cN1, pM1) - Signed by [name], MD on 11/06/2017   10/29/2017 Pathology Results   10/29/2017 Surgical Pathology Diagnosis Soft Tissue Needle Core Biopsy, right adrenal mass - METASTATIC ADENOCARCINOMA, SEE COMMENT.   11/06/2017 Initial Diagnosis   Intrahepatic cholangiocarcinoma ([place])   11/06/2017 Imaging   11/06/2017 CT AP IMPRESSION: 1. Increase in RIGHT pleural effusion. Stable masslike consolidation at the RIGHT lung base representing neoplasm versus pneumonia. 2. Stable metastatic nodule at the RIGHT  lung base. 3. Infiltrative mass in the central LEFT hepatic lobe and discrete lesions in the RIGHT hepatic lobe consistent with metastatic disease versus primary hepatic biliary carcinoma. Favor cholangiocarcinoma. 4. Percutaneous drainage catheter extends the porta hepatis. Small trapped pneumothorax along the tract of the catheter at the inferolateral RIGHT lung base. 5. Stable adrenal metastasis. 6. Metastatic adenopathy in the retroperitoneum unchanged. 7. Morphologic changes in liver consistent with  cirrhosis.   11/08/2017 Imaging   11/08/2017 CT AP IMPRESSION: 1. Overall no significant interval change since the CT of 11/06/2017. Stable positioning of the percutaneous biliary drain with similar degree of biliary ductal dilatation. 2. Cirrhosis with multiple hepatic metastatic disease. Infiltrative appearing lesion in the left lobe of the liver appears to extend into the left portal vein and may represent a tumor thrombus, or bland thrombus. A primary biliary neoplasm (cholangiocarcinoma, or metastatic disease are other considerations. 3. Retroperitoneal, mesenteric, and adrenal metastatic disease. 4. Hepatic colopathy versus less likely segmental colitis of the hepatic flexure. 5. Small right pleural effusion and bilateral lung base pulmonary masses/nodules as seen previously. 6. Tiny pockets of pneumoperitoneum lateral to the liver along the percutaneous drain, decreased since the prior CT. 7. Other findings as above.   11/20/2017 - 11/24/2017 Hospital Admission   Admit date: 11/08/2017 Admission diagnosis: fatigue Additional comments: discharged 11/24/2017   12/18/2017 - 02/14/2018 Chemotherapy   first line chemo gemcitabine/cisplatin on days 1 and 15 every 28 days starting 12/18/17. Stopped 02/14/18 after cycle 3.    12/22/2017 - 12/30/2017 Hospital Admission   Admit date: 12/22/2017 Admission diagnosis: Dysuria   12/22/2017 Imaging   12/22/2017 CT AP IMPRESSION: 1.  Prominently distended and otherwise normal urinary bladder with mild prostatomegaly. Findings suggest acute bladder outlet obstruction. No hydronephrosis. 2. Large colorectal stool volume compatible with constipation. New mild circumferential rectal wall thickening and perirectal fat haziness suggesting stercoral colitis. No free air.  No abscess. 3. Stable infiltrative cholangiocarcinoma in the central left liver lobe with associated peripheral intrahepatic biliary ductal dilatation throughout the left liver lobe. 4. Stable well-positioned internal external drain traversing the right liver lobe and decompressing the right liver biliary system. 5. Stable liver, bilateral adrenal, retroperitoneal, left mesenteric and right infrahilar nodal and left lung base metastases. No progressive metastatic disease since 12/03/2017 CT. 6. Cirrhosis. Trace perihepatic ascites, decreased. Stable moderate splenomegaly. 7.  Aortic Atherosclerosis (ICD10-I70.0).   03/11/2018 -  Chemotherapy   Second lineFOLFOX every 2 weeks started 03/11/2018. Held after cycle 5 due to GI bleeding from Varices.    05/13/2018 Procedure   Upper Endoscopy by Dr Carlean Purl 05/13/18  IMPRESSION - Grade II and large (> 5 mm) esophageal varices. Completely eradicated. Banded. Nipples seen no visible bleeding but he was vomiting blood as could only lightly sedate due to shock. Was not intubated as he was DNR and declined intubation. Wife updateed by phone pre and post procedure - Red blood in the entire stomach. - No specimens collected.      REVIEW OF SYSTEMS:   Constitutional: Denies fevers and chills. Eyes: Denies blurriness of vision Ears, nose, mouth, throat, and face: Denies mucositis or sore throat Respiratory: Denies cough, dyspnea or wheezes Cardiovascular: Denies palpitation, chest discomfort Gastrointestinal:  Denies nausea, heartburn.  Nurse reports black stools but no overt bleeding.  Has his baseline abdominal  pain. Skin: Denies abnormal skin rashes Lymphatics: Denies new lymphadenopathy or easy bruising Neurological:Denies numbness, tingling or new weaknesses Behavioral/Psych: Mood is stable, no new changes  Extremities: Has swelling in his lower extremities All other systems were reviewed with the patient and are negative.  I have reviewed the past medical history, past surgical history, social history and family history with the patient and they are unchanged from previous note.   PHYSICAL EXAMINATION:  Vitals:   06/22/18 0400 06/22/18 0800  BP: (!) 90/54   Pulse:    Resp: 13   Temp: 98.3 F (36.8 C) 98.4 F (36.9 C)  SpO2: 98%    Filed Weights   06/19/18 0316 06/20/18 0500 06/21/18 0500  Weight: 207 lb (93.9 kg) 218 lb 7.6 oz (99.1 kg) 220 lb 10.9 oz (100.1 kg)    Intake/Output from previous day: 06/14 0701 - 06/15 0700 In: 3772.1 [P.O.:1310; I.V.:2198.1; IV Piggyback:264] Out: 2979 [Urine:1450; Drains:100]  GENERAL:alert, no distress and comfortable.  Chronically ill-appearing. SKIN: Dry oral mucosa.  No thrush. EYES: normal, Conjunctiva are pink and non-injected, sclera clear OROPHARYNX:no exudate, no erythema and lips, buccal mucosa, and tongue normal  NECK: supple, thyroid normal size, non-tender, without nodularity LYMPH:  no palpable lymphadenopathy in the cervical, axillary or inguinal LUNGS: Clear.  Normal breathing effort. HEART: regular rate & rhythm.  Trace bilateral lower extremity edema. ABDOMEN:abdomen soft, positive bowel sounds.  Ascites noted. Musculoskeletal:no cyanosis of digits and no clubbing  NEURO: alert & oriented x 3 with fluent speech, no focal motor/sensory deficits  LABORATORY DATA:  I have reviewed the data as listed CMP Latest Ref Rng & Units 06/22/2018 06/21/2018 06/20/2018  Glucose 70 - 99 mg/dL 109(H) 104(H) 63(L)  BUN 8 - 23 mg/dL _0 Creatinine 0.61 - 1.24 mg/dL 0.56(L) 0.56(L) 0.48(L)  Sodium 135 - 145 mmol/L 135 137 139   Potassium 3.5 - 5.1 mmol/L 3.9 3.7 3.4(L)  Chloride 98 - 111 mmol/L 109 113(H) 113(H)  CO2 22 - 32 mmol/L 22 21(L) 22  Calcium 8.9 - 10.3 mg/dL 7.8(L) 7.8(L) 7.9(L)  Total Protein 6.5 - 8.1 g/dL - - 6.5  Total Bilirubin 0.3 - 1.2 mg/dL - - 5.9(H)  Alkaline Phos 38 - 126 U/L - - 224(H)  AST 15 - 41 U/L - - 49(H)  ALT 0 - 44 U/L - - 23    Lab Results  Component Value Date   WBC 10.8 (H) 06/22/2018   HGB 7.0 (L) 06/22/2018   HCT 22.7 (L) 06/22/2018   MCV 90.8 06/22/2018   PLT 139 (L) 06/22/2018   NEUTROABS 8.8 (H) 06/20/2018    Ct Head Wo Contrast  Result Date: 06/19/2018 CLINICAL DATA:  Head trauma, minor. Fall. Cholangiocarcinoma with known metastatic disease. Initial encounter. EXAM: CT HEAD WITHOUT CONTRAST CT MAXILLOFACIAL WITHOUT CONTRAST CT CERVICAL SPINE WITHOUT CONTRAST TECHNIQUE: Multidetector CT imaging of the head, cervical spine, and maxillofacial structures were performed using the standard protocol without intravenous contrast. Multiplanar CT image reconstructions of the cervical spine and maxillofacial structures were also generated. COMPARISON:  CT of the head and cervical spine 05/12/2018. MRI brain 02/26/2018 FINDINGS: CT HEAD FINDINGS Brain: No acute infarct, hemorrhage, or mass lesion is present. No significant white matter lesions are present. The ventricles are of normal size. No significant extraaxial fluid collection is present. The brainstem and cerebellum are within normal limits. Vascular: Atherosclerotic calcifications are present in the cavernous right internal carotid artery. There is no hyperdense vessel. Skull: Calvarium is intact. No focal lytic or blastic lesions are present. CT MAXILLOFACIAL FINDINGS Osseous: No acute or healing facial fractures are present. Nasal bones are intact. Zygomatic arch is normal bilaterally. Orbits: The globes and orbits are within normal limits. Sinuses: The paranasal sinuses and mastoid air cells are clear. Soft tissues: Left  infraorbital soft tissue swelling and hematoma is present. There is no significant laceration. No foreign body is present. No other significant soft tissue injury is present. CT CERVICAL SPINE FINDINGS Alignment: There straightening of the normal cervical lordosis. Slight kyphosis is present at C4-5. AP alignment is anatomic. Skull base and vertebrae: The craniocervical junction is normal. Vertebral body heights are maintained. No acute or healing fractures are present. Soft tissues and spinal canal: No prevertebral fluid or swelling. No visible canal hematoma. Vascular calcifications are present at the carotid bifurcations, right greater than left. Disc levels: Solid fusion is present C5-6, C6-7, and C7-T1. Adjacent level disease is present at C4-5 with foraminal narrowing bilaterally due to uncovertebral disease. Right foraminal narrowing is present at C3-4 greater than C2-3 due to uncovertebral and facet disease. Upper chest: The lung apices are clear. Right IJ Port-A-Cath is in place. Thoracic inlet is otherwise normal. IMPRESSION: 1. Left infraorbital facial soft tissue swelling without underlying fracture. 2. Normal CT appearance of the brain for age. No acute intracranial abnormality or trauma to the head. 3. No acute or healing facial fractures. 4. No acute trauma to the cervical spine. 5. Cervical spine fusion at C5-6, C6-7, and C7-T1. 6. Adjacent level disease at C4-5 with bilateral foraminal stenosis. Electronically Signed   By: San Morelle M.D.   On: 06/19/2018 06:15   Ct Cervical Spine Wo Contrast  Result Date: 06/19/2018 CLINICAL DATA:  Head trauma, minor. Fall. Cholangiocarcinoma with known metastatic disease. Initial encounter. EXAM: CT HEAD WITHOUT CONTRAST CT MAXILLOFACIAL WITHOUT CONTRAST CT CERVICAL SPINE WITHOUT CONTRAST TECHNIQUE: Multidetector CT imaging of the head, cervical spine, and maxillofacial structures were performed using the standard protocol without intravenous  contrast. Multiplanar CT image reconstructions of the cervical spine and maxillofacial structures were also generated. COMPARISON:  CT of the head and cervical spine 05/12/2018. MRI brain 02/26/2018 FINDINGS: CT HEAD FINDINGS Brain: No acute infarct, hemorrhage, or mass lesion is present. No significant white matter lesions are present. The ventricles are of normal size. No significant extraaxial fluid collection is present. The brainstem and cerebellum are within normal limits. Vascular: Atherosclerotic calcifications are present in the cavernous right internal carotid artery. There is no hyperdense vessel. Skull: Calvarium is intact. No focal lytic or blastic lesions are present. CT MAXILLOFACIAL FINDINGS Osseous: No acute or healing facial fractures are present. Nasal bones are intact. Zygomatic arch is normal bilaterally. Orbits: The globes and orbits are within normal limits. Sinuses: The paranasal sinuses and mastoid air cells are clear. Soft tissues: Left infraorbital soft tissue swelling and hematoma is present. There is no significant laceration. No foreign body is present. No other significant soft tissue injury is present. CT CERVICAL SPINE FINDINGS Alignment: There straightening of the normal cervical lordosis. Slight kyphosis is present at C4-5. AP alignment is anatomic. Skull base and vertebrae: The craniocervical junction is normal. Vertebral body heights are maintained. No acute or healing fractures are present. Soft tissues and spinal canal: No prevertebral fluid or swelling. No visible canal hematoma. Vascular calcifications are present at the carotid bifurcations, right greater than left. Disc levels: Solid fusion is present C5-6, C6-7, and C7-T1. Adjacent level disease is present at C4-5 with foraminal narrowing bilaterally due to uncovertebral disease. Right foraminal narrowing is present at C3-4 greater than C2-3 due to uncovertebral and facet disease. Upper chest: The lung apices are clear.  Right IJ Port-A-Cath is in place. Thoracic inlet is otherwise normal. IMPRESSION: 1. Left infraorbital facial soft tissue swelling without underlying fracture. 2. Normal CT appearance of the brain for age. No acute intracranial abnormality or trauma to the head. 3. No acute or healing facial fractures. 4. No acute trauma to the cervical spine. 5. Cervical spine fusion at C5-6, C6-7, and C7-T1. 6. Adjacent level disease at C4-5 with bilateral foraminal stenosis. Electronically Signed   By: San Morelle M.D.   On: 06/19/2018 06:15   Ct Abdomen Pelvis W Contrast  Result Date: 06/19/2018 CLINICAL DATA:  Abdominal pain. History of cholangiocarcinoma. Possible sepsis. EXAM: CT ABDOMEN AND PELVIS WITH CONTRAST TECHNIQUE: Multidetector CT imaging of the abdomen and pelvis was performed using the standard protocol following bolus administration of intravenous contrast. CONTRAST:  13m OMNIPAQUE IOHEXOL 300 MG/ML  SOLN COMPARISON:  CT scan of February 19, 2018. FINDINGS: Lower chest: Right lower lobe pneumonia or atelectasis is noted. Stable rounded density is noted in left posterior costophrenic sulcus concerning for metastatic disease. Small left pleural effusion is noted. Hepatobiliary: Air and probable sludge is noted within gallbladder lumen. Hepatic cirrhosis is noted. Right hepatic pneumobilia is noted. Multiple low densities are noted throughout the liver which may represent metastatic disease. Stable position of 2 right external biliary drains are noted. Pancreas: Unremarkable. No pancreatic ductal dilatation or surrounding inflammatory changes. Spleen: Stable splenomegaly is noted. No focal abnormality is noted. Adrenals/Urinary Tract: Stable bilateral adrenal gland enlargement is noted. Bilateral nonobstructive nephrolithiasis is noted. No hydronephrosis or renal obstruction is noted. Urinary bladder is unremarkable. Stomach/Bowel: The stomach appears normal. Stool is noted throughout the colon. There  is no evidence of bowel obstruction. The appendix is not clearly visualized. Mild wall and fold thickening is seen involving the right and transverse colon which may represent infectious or inflammatory colitis. Vascular/Lymphatic: Aortic atherosclerosis is noted. Grossly stable 16 mm right periaortic lymph node is  noted. Stable 1.6 cm left periaortic lymph node is noted. Reproductive: Prostate is unremarkable. Other: Mild ascites is noted. Musculoskeletal: No acute or significant osseous findings. IMPRESSION: Continued presence of probable multiple hepatic metastatic lesions. Hepatic cirrhosis is noted, with continued presence of 2 external biliary drainage catheters. Right hepatic pneumobilia is noted. Stable rounded density noted in left posterior costophrenic sulcus concerning for metastatic disease. Interval development of mild ascites. Wall thickening of transverse and right colon is noted which may represent infectious or inflammatory colitis, or potentially may be due to ascites. Stable splenomegaly. Stable bilateral adrenal gland enlargement is noted consistent with metastatic disease. Stable periaortic adenopathy is noted which may be neoplastic or inflammatory in etiology. Aortic Atherosclerosis (ICD10-I70.0). Electronically Signed   By: Marijo Conception M.D.   On: 06/19/2018 16:37   US Paracentesis  Result Date: 06/19/2018 INDICATION: Patient with history of intrahepatic cholangiocarcinoma, NASH cirrhosis, recurrent ascites, fever/sepsis, anemia; request made for diagnostic paracentesis up to 1 liter. EXAM: ULTRASOUND GUIDED DIAGNOSTIC PARACENTESIS MEDICATIONS: None COMPLICATIONS: None immediate. PROCEDURE: Informed written consent was obtained from the patient after a discussion of the risks, benefits and alternatives to treatment. A timeout was performed prior to the initiation of the procedure. Initial ultrasound scanning demonstrates a large amount of ascites within the left lower abdominal  quadrant. The left lower abdomen was prepped and draped in the usual sterile fashion. 1% lidocaine was used for local anesthesia. Following this, a 19 gauge, 10-cm, Yueh catheter was introduced. An ultrasound image was saved for documentation purposes. The paracentesis was performed. The catheter was removed and a dressing was applied. The patient tolerated the procedure well without immediate post procedural complication. FINDINGS: A total of approximately 1 liter of yellow fluid was removed. Samples were sent to the laboratory as requested by the clinical team. IMPRESSION: Successful ultrasound-guided diagnostic paracentesis yielding 1 liter of peritoneal fluid. Read by: Rowe Robert, PA-C Electronically Signed   By: Aletta Edouard M.D.   On: 06/19/2018 16:12   Portable Chest X-ray  Result Date: 06/19/2018 CLINICAL DATA:  ET tube placement EXAM: PORTABLE CHEST 1 VIEW COMPARISON:  06/19/2018 FINDINGS: Endotracheal tube is 4 cm above the carina. Right Port-A-Cath is unchanged with the tip in the right atrium. Heart is normal size. Low lung volumes. Increasing perihilar opacities and bibasilar opacities which could reflect edema and/or atelectasis. No visible significant effusions or acute bony abnormality. IMPRESSION: Endotracheal tube 4 cm above the carina. Increasing perihilar and bibasilar opacities which could reflect edema or atelectasis. Low lung volumes. Electronically Signed   By: Rolm Baptise M.D.   On: 06/19/2018 20:17   Dg Chest Port 1 View  Result Date: 06/19/2018 CLINICAL DATA:  Fall. Sepsis. Cholangiocarcinoma with metastatic disease. EXAM: PORTABLE CHEST 1 VIEW COMPARISON:  One-view chest x-ray 05/12/2018 CT of the chest 02/19/2018 FINDINGS: Heart size is normal. Right IJ Port-A-Cath is stable. The tip is in the right atrium. Asymmetric interstitial pattern is again noted on the left. Left basilar metastasis is not well seen. Lung volumes are low. Mild pulmonary vascular congestion is present  without frank edema. IMPRESSION: 1. Cardiomegaly with mild vascular congestion. 2. Low lung volumes. 3. Stable chronic interstitial changes, asymmetric on the left. Electronically Signed   By: San Morelle M.D.   On: 06/19/2018 04:27   Ct Maxillofacial Wo Contrast  Result Date: 06/19/2018 CLINICAL DATA:  Head trauma, minor. Fall. Cholangiocarcinoma with known metastatic disease. Initial encounter. EXAM: CT HEAD WITHOUT CONTRAST CT MAXILLOFACIAL WITHOUT CONTRAST CT CERVICAL SPINE  WITHOUT CONTRAST TECHNIQUE: Multidetector CT imaging of the head, cervical spine, and maxillofacial structures were performed using the standard protocol without intravenous contrast. Multiplanar CT image reconstructions of the cervical spine and maxillofacial structures were also generated. COMPARISON:  CT of the head and cervical spine 05/12/2018. MRI brain 02/26/2018 FINDINGS: CT HEAD FINDINGS Brain: No acute infarct, hemorrhage, or mass lesion is present. No significant white matter lesions are present. The ventricles are of normal size. No significant extraaxial fluid collection is present. The brainstem and cerebellum are within normal limits. Vascular: Atherosclerotic calcifications are present in the cavernous right internal carotid artery. There is no hyperdense vessel. Skull: Calvarium is intact. No focal lytic or blastic lesions are present. CT MAXILLOFACIAL FINDINGS Osseous: No acute or healing facial fractures are present. Nasal bones are intact. Zygomatic arch is normal bilaterally. Orbits: The globes and orbits are within normal limits. Sinuses: The paranasal sinuses and mastoid air cells are clear. Soft tissues: Left infraorbital soft tissue swelling and hematoma is present. There is no significant laceration. No foreign body is present. No other significant soft tissue injury is present. CT CERVICAL SPINE FINDINGS Alignment: There straightening of the normal cervical lordosis. Slight kyphosis is present at C4-5.  AP alignment is anatomic. Skull base and vertebrae: The craniocervical junction is normal. Vertebral body heights are maintained. No acute or healing fractures are present. Soft tissues and spinal canal: No prevertebral fluid or swelling. No visible canal hematoma. Vascular calcifications are present at the carotid bifurcations, right greater than left. Disc levels: Solid fusion is present C5-6, C6-7, and C7-T1. Adjacent level disease is present at C4-5 with foraminal narrowing bilaterally due to uncovertebral disease. Right foraminal narrowing is present at C3-4 greater than C2-3 due to uncovertebral and facet disease. Upper chest: The lung apices are clear. Right IJ Port-A-Cath is in place. Thoracic inlet is otherwise normal. IMPRESSION: 1. Left infraorbital facial soft tissue swelling without underlying fracture. 2. Normal CT appearance of the brain for age. No acute intracranial abnormality or trauma to the head. 3. No acute or healing facial fractures. 4. No acute trauma to the cervical spine. 5. Cervical spine fusion at C5-6, C6-7, and C7-T1. 6. Adjacent level disease at C4-5 with bilateral foraminal stenosis. Electronically Signed   By: San Morelle M.D.   On: 06/19/2018 06:15   Ir Paracentesis  Result Date: 06/15/2018 INDICATION: Patient with history of intrahepatic cholangiocarcinoma, NASH cirrhosis, recurrent ascites. Request made for therapeutic paracentesis. EXAM: ULTRASOUND GUIDED THERAPEUTIC PARACENTESIS MEDICATIONS: None COMPLICATIONS: None immediate. PROCEDURE: Informed written consent was obtained from the patient after a discussion of the risks, benefits and alternatives to treatment. A timeout was performed prior to the initiation of the procedure. Initial ultrasound scanning demonstrates a moderate-to-large amount of ascites within the right lower abdominal quadrant. The right lower abdomen was prepped and draped in the usual sterile fashion. 1% lidocaine was used for local anesthesia.  Following this, a 19 gauge, 10-cm, Yueh catheter was introduced. An ultrasound image was saved for documentation purposes. The paracentesis was performed. The catheter was removed and a dressing was applied. The patient tolerated the procedure well without immediate post procedural complication. FINDINGS: A total of approximately 4.7 liters of yellow fluid was removed. IMPRESSION: Successful ultrasound-guided therapeutic paracentesis yielding 4.7 liters of peritoneal fluid. Read by: Rowe Robert, PA-C Electronically Signed   By: Aletta Edouard M.D.   On: 06/15/2018 13:56   Ir Paracentesis  Result Date: 06/08/2018 INDICATION: Patient with history of intrahepatic cholangiocarcinoma, NASH cirrhosis, recurrent ascites.  Request made for therapeutic paracentesis. EXAM: ULTRASOUND GUIDED THERAPEUTIC PARACENTESIS MEDICATIONS: None COMPLICATIONS: None immediate. PROCEDURE: Informed written consent was obtained from the patient after a discussion of the risks, benefits and alternatives to treatment. A timeout was performed prior to the initiation of the procedure. Initial ultrasound scanning demonstrates a moderate to large amount of ascites within the right lower abdominal quadrant. The right lower abdomen was prepped and draped in the usual sterile fashion. 1% lidocaine was used for local anesthesia. Following this, a 19 gauge, 10-cm, Yueh catheter was introduced. An ultrasound image was saved for documentation purposes. The paracentesis was performed. The catheter was removed and a dressing was applied. The patient tolerated the procedure well without immediate post procedural complication. FINDINGS: A total of approximately 4.8 liters of yellow fluid was removed. IMPRESSION: Successful ultrasound-guided paracentesis yielding 4.8 liters of peritoneal fluid. Read by: Rowe Robert, PA-C Electronically Signed   By: Sandi Mariscal M.D.   On: 06/08/2018 10:40   Ir Paracentesis  Result Date: 06/02/2018 INDICATION: Patient  with history of intrahepatic cholangiocarcinoma, NASH cirrhosis, recurrent ascites. Request made for therapeutic paracentesis. EXAM: ULTRASOUND GUIDED THERAPEUTIC PARACENTESIS MEDICATIONS: None COMPLICATIONS: None immediate. PROCEDURE: Informed written consent was obtained from the patient after a discussion of the risks, benefits and alternatives to treatment. A timeout was performed prior to the initiation of the procedure. Initial ultrasound scanning demonstrates a large amount of ascites within the right lower abdominal quadrant. The right lower abdomen was prepped and draped in the usual sterile fashion. 1% lidocaine was used for local anesthesia. Following this, a 19 gauge, 10-cm, Yueh catheter was introduced. An ultrasound image was saved for documentation purposes. The paracentesis was performed. The catheter was removed and a dressing was applied. The patient tolerated the procedure well without immediate post procedural complication. FINDINGS: A total of approximately 5.3 liters of yellow fluid was removed. IMPRESSION: Successful ultrasound-guided therapeutic paracentesis yielding 5.3 liters of peritoneal fluid. Read by: Rowe Robert, PA-C Electronically Signed   By: Sandi Mariscal M.D.   On: 06/02/2018 15:45   Ir Paracentesis  Result Date: 05/26/2018 INDICATION: Patient with history of NASH cirrhosis, intrahepatic cholangiocarcinoma, and recurrent ascites. Request is made for diagnostic and therapeutic paracentesis with a max of 5 L. EXAM: ULTRASOUND GUIDED DIAGNOSTIC AND THERAPEUTIC PARACENTESIS MEDICATIONS: 10 mL 1% lidocaine COMPLICATIONS: None immediate. PROCEDURE: Informed written consent was obtained from the patient after a discussion of the risks, benefits and alternatives to treatment. A timeout was performed prior to the initiation of the procedure. Initial ultrasound scanning demonstrates a large amount of ascites within the left lower abdominal quadrant. The left lower abdomen was prepped and  draped in the usual sterile fashion. 1% lidocaine was used for local anesthesia. Following this, a 19 gauge, 10-cm, Yueh catheter was introduced. An ultrasound image was saved for documentation purposes. The paracentesis was performed. The catheter was removed and a dressing was applied. The patient tolerated the procedure well without immediate post procedural complication. FINDINGS: A total of approximately 5 L of clear yellow fluid was removed. Samples were sent to the laboratory as requested by the clinical team. IMPRESSION: Successful ultrasound-guided paracentesis yielding 5 L of peritoneal fluid. Read by: Earley Abide, PA-C Electronically Signed   By: Markus Daft M.D.   On: 05/26/2018 15:10    ASSESSMENT AND PLAN: 1.  Stage IV cholangiocarcinoma 2.  Anemia secondary to GI bleed from duodenal ulcers and varices 3.  Sepsis from unclear source 4.  Obstructive jaundice status post bilateral percutaneous  biliary drain 5.  Portal vein thrombosis 6.  Cirrhosis 7.  Goals of care, limited code.  Palliative care team has been consulted.  -Hemoglobin is 7.0 today.  1 unit packed red blood cells has been ordered. -Remains on IV antibiotics.  Cultures negative to date.  Antibiotics per primary team. -Palliative care team will consult later today for possible hospice upon discharge. -His [facility] genomic test came back last week, which showed KRAS/NRAS/BRAF wild type, no other targetable mutation.    LOS: 3 days   Mikey Bussing, DNP, AGPCNP-BC, AOCNP 06/22/18  Addendum I have seen the patient, examined him. I agree with the assessment and and plan and have edited the notes.   I spoke with pt around noon, he understands he is terminal, and is realistic about the goal of care. He has agreed with hospice. He does not want to go to [facility] place which he is familiar with.   I spoke with his wife around 6pm today, her phone number has been updated in Epic. She also agree with home hospice. Pt's  daughter and son will be also to help him at home if needed.  If his hypotension and overall condition does not improved in the next few days, we can discuss with pt and his wife again to see he agrees to withdraw care and go home if he is stable to leave.   I told his wife to anticipate a call from hospice coordinate and probably Dr. Rowe Pavy tomorrow.   [name]  06/22/2018

## 2018-06-22 NOTE — Progress Notes (Signed)
Peripherally Inserted Central Catheter/Midline Placement  The IV Nurse has discussed with the patient and/or persons authorized to consent for the patient, the purpose of this procedure and the potential benefits and risks involved with this procedure.  The benefits include less needle sticks, lab draws from the catheter, and the patient may be discharged home with the catheter. Risks include, but not limited to, infection, bleeding, blood clot (thrombus formation), and puncture of an artery; nerve damage and irregular heartbeat and possibility to perform a PICC exchange if needed/ordered by physician.  Alternatives to this procedure were also discussed.  Bard Power PICC patient education guide, fact sheet on infection prevention and patient information card has been provided to patient /or left at bedside.    PICC/Midline Placement Documentation  PICC Double Lumen 06/22/18 PICC Left Brachial 43 cm 3 cm (Active)  Indication for Insertion or Continuance of Line Vasoactive infusions 06/22/18 1840  Exposed Catheter (cm) 3 cm 06/22/18 1840  Site Assessment Clean;Dry;Intact 06/22/18 1840  Lumen #1 Status Flushed;Blood return noted 06/22/18 1840  Lumen #2 Status Flushed;Blood return noted 06/22/18 1840  Dressing Type Transparent 06/22/18 1840  Dressing Status Clean;Dry;Intact;Antimicrobial disc in place 06/22/18 Stevens dressing 06/22/18 1840  Dressing Change Due  06/22/18 1840   Tel consent signed by wife per pt request   Synthia Innocent 06/22/2018, 6:41 PM

## 2018-06-22 NOTE — Progress Notes (Signed)
Pharmacy Antibiotic Note  Matthew Lewis is a 63 y.o. male admitted on 06/19/2018 with sepsis.  Pharmacy has been consulted for meropenem dosing.  06/22/2018:  D4 Meropenem  Afebrile  WBC 10.8, starting steroids  Renal function stable  Cx data negative to day  Plan: Meropenem 1 Gm IV q8h F/u scr/cultures Noted plans for 5 days of empiric therapy  Height: 5\' 11"  (180.3 cm) Weight: 220 lb 10.9 oz (100.1 kg) IBW/kg (Calculated) : 75.3  Temp (24hrs), Avg:98.2 F (36.8 C), Min:97.7 F (36.5 C), Max:98.9 F (37.2 C)  Recent Labs  Lab 06/19/18 0330 06/19/18 1103 06/19/18 1145 06/20/18 0347 06/21/18 0530 06/21/18 0553 06/22/18 0514  WBC 15.7*  --  11.8* 10.7*  --  12.1* 10.8*  CREATININE 0.77  --  0.76 0.48* 0.56*  --  0.56*  LATICACIDVEN 3.0* 1.3  --  1.2  --   --   --     Estimated Creatinine Clearance: 112.4 mL/min (A) (by C-G formula based on SCr of 0.56 mg/dL (L)).    Allergies  Allergen Reactions  . Morphine And Related Other (See Comments)    Patient is recovering from drug addiction and wants to avoid any narcotics    Antimicrobials this admission: 6/12 meropenem >>  6/12 vancomycin >> 6/13 6/12 cefe/flagyl >> x1 ED  Dose adjustments this admission:  Microbiology results: 6/12 BCx: ngtd 6/12 UCx: NGF 6/12 SARS2: negative  6/12 MRSA PCR: negative  Thank you for allowing pharmacy to be a part of this patient's care.  Biagio Borg 06/22/2018 12:04 PM

## 2018-06-22 NOTE — Consult Note (Signed)
Consultation Note Date: 06/22/2018   Patient Name: Matthew Lewis  DOB: July 07, 1955  MRN: [id number]  Age / Sex: 63 y.o., male  PCP: [name], MD Referring Physician: Laurin Coder, MD  Reason for Consultation: Establishing goals of care  HPI/Patient Profile: 63 y.o. male admitted on 06/19/2018    Clinical Assessment and Goals of Care: 63 year old  gentleman with metastatic cholangiocarcinoma admitted 6/12 following a fall. He underwent EGD 2 weeks ago with variceal banding.  He was felt to have sepsis, intra-abdominal source with concern for upper GI bleed.   He has a life limiting illness of cholangiocarcinoma-stage IV, he has been seen by PMT in previous hospitalizations, also with history of liver cirrhosis, history of obstructive jaundice with drain placement.   Patient remains admitted to step down unit with sepsis, unclear source. He is on antibiotics. He has ongoing low BP, on pressors and also midodrine. He is on opioids for pain. He is requiring frequent ascites for paracentesis.   A palliative consult has been requested for ongoing goals of care discussions.   Matthew Lewis is resting in bed, appears in no distress, but appears weak and chronically ill. I introduced myself and palliative care as follows: Palliative medicine is specialized medical care for people living with serious illness. It focuses on providing relief from the symptoms and stress of a serious illness. The goal is to improve quality of life for both the patient and the family.  Matthew Lewis is a very pleasant and polite gentleman. I discussed with him about his current illness and how he is feeling. Also discussed with him that PMT has seen him in the past.   He has great insight into his current condition and how his current hospitalization is going. We talked about comfort measures, hospice level of care. We talked  about appropriate pain management and focusing on staying as comfortable as possible, even in the face of serious and irreversible illness.   NEXT OF KIN  wife.   SUMMARY OF RECOMMENDATIONS   Agree with partial code.  Discussed with patient about hospice support, he is in agreement. Patient appears fully awake alert oriented. He is able to voice to me that he has serious irreversible life limiting illness with limited prognosis. Call placed at [phone] and was answered by a male's voice, when I asked if this was Matthew Lewis, I was asked who I was. When I identified myself, I was told that I had dialed a wrong number.  We remain sensitive to the fact that the patient, his wife and their family as a unit are going through a difficult course. PMT remains available for additional support, conversations and to facilitate disposition options and overall goals of care discussions.  We will re attempt calling wife on 06-23-2018.   Code Status/Advance Care Planning:  Limited code    Symptom Management:   Continue current mode of care.   Palliative Prophylaxis:   Bowel Regimen   Psycho-social/Spiritual:   Desire for further Chaplaincy  support:yes  Additional Recommendations: Education on Hospice  Prognosis:   Guarded   Discharge Planning: needs home with hospice, PMT to re attempt to contact wife on 06-23-2018.       Primary Diagnoses: Present on Admission: . SIRS (systemic inflammatory response syndrome) (HCC) . Metastatic adenocarcinoma to liver ([place]) . Cholangiocarcinoma ([place]) . CAD (coronary artery disease)- PCI 2001- no evaluation since   I have reviewed the medical record, interviewed the patient and family, and examined the patient. The following aspects are pertinent.  Past Medical History:  Diagnosis Date  . Abscess of anal and rectal regions    horse shoe abscess  . Alcoholic cirrhosis ([place])   . Arthritis    "everywhere; especially in my spine"  .  Cirrhosis ([place])    STAGE 1 CIRRHOSIS PATIENT SEES DR NAMDIGAM FOR  . Colon cancer ([place]) 05/2005   TX SURGERY WITH LYMPH NODE REMOVAL  . Diabetes mellitus without complication ([place])    type 2  . DM type 2 with diabetic peripheral neuropathy (HCC)    left foot  . Fatigue   . Fistula, anal    multiple  . History of substance abuse ([place])    last alcohol was 08/2011; last marijuania was 08/2011  . Hypertension    no bp meds in last year  . PTSD (post-traumatic stress disorder)    "severe" HX OF  . Sleep deprivation    Social History   Socioeconomic History  . Marital status: Married    Spouse name: Not on file  . Number of children: Not on file  . Years of education: Not on file  . Highest education level: Not on file  Occupational History  . Occupation: works at [employer]  . Financial resource strain: Not on file  . Food insecurity    Worry: Not on file    Inability: Not on file  . Transportation needs    Medical: Not on file    Non-medical: Not on file  Tobacco Use  . Smoking status: Former Smoker    Packs/day: 1.00    Years: 10.00    Pack years: 10.00    Types: Cigarettes    Quit date: 01/08/1991    Years since quitting: 27.4  . Smokeless tobacco: Never Used  Substance and Sexual Activity  . Alcohol use: Not Currently    Comment: 06/05/11 "drank enough when I was younger to last me my whole life; don't remember when I had my last drink, maybe 2012"  . Drug use: Not Currently    Types: Oxycodone, Marijuana    Comment: 06/05/11 "pot head in my younger days; 2009 dr had me on oxycodone and valium for almost 25yr"  . Sexual activity: Not Currently  Lifestyle  . Physical activity    Days per week: Not on file    Minutes per session: Not on file  . Stress: To some extent  Relationships  . Social Herbalist on phone: Not on file    Gets together: Not on file    Attends religious service: Not on file    Active member of club or organization:  Not on file    Attends meetings of clubs or organizations: Not on file    Relationship status: Not on file  Other Topics Concern  . Not on file  Social History Narrative  . Not on file   Family History  Problem Relation Age of Onset  . Heart disease Father   .  Heart disease Mother   . Cancer Mother   . Cancer Sister   . Heart disease Brother    Scheduled Meds: . Chlorhexidine Gluconate Cloth  6 each Topical Daily  . DULoxetine  30 mg Oral BID  . fludrocortisone  0.1 mg Oral Daily  . gabapentin  400 mg Oral Daily  . gabapentin  800 mg Oral QHS  . hydrocortisone sod succinate (SOLU-CORTEF) inj  50 mg Intravenous Q12H  . insulin aspart  0-9 Units Subcutaneous TID WC  . insulin detemir  10 Units Subcutaneous QHS  . insulin detemir  20 Units Subcutaneous Daily  . mouth rinse  15 mL Mouth Rinse BID  . midodrine  5 mg Oral TID WC  . pantoprazole (PROTONIX) IV  40 mg Intravenous Q12H  . sodium chloride flush  10-40 mL Intracatheter Q12H   Continuous Infusions: . sodium chloride 500 mL (06/22/18 0824)  . meropenem (MERREM) IV Stopped (06/22/18 1102)  . phenylephrine (NEO-SYNEPHRINE) Adult infusion 50 mcg/min (06/22/18 1128)  . potassium PHOSPHATE IVPB (in mmol) 30 mmol (06/22/18 1101)   PRN Meds:.sodium chloride, acetaminophen **OR** acetaminophen, iohexol, lidocaine, morphine, ondansetron **OR** ondansetron (ZOFRAN) IV, sodium chloride flush Medications Prior to Admission:  Prior to Admission medications   Medication Sig Start Date End Date Taking? Authorizing Provider  acetaminophen (TYLENOL) 325 MG tablet Take 650 mg by mouth every 6 (six) hours as needed for mild pain.   Yes [provider]  calcium carbonate (TUMS - DOSED IN MG ELEMENTAL CALCIUM) 500 MG chewable tablet Chew 2 tablets by mouth daily as needed for indigestion or heartburn.   Yes [provider]  celecoxib (CELEBREX) 200 MG capsule Take 200 mg by mouth daily.   Yes [provider]   DULoxetine (CYMBALTA) 30 MG capsule Take 30 mg by mouth 2 (two) times daily.   Yes [provider]  fexofenadine-pseudoephedrine (ALLEGRA-D) 60-120 MG 12 hr tablet Take 1 tablet by mouth daily.   Yes [provider]  gabapentin (NEURONTIN) 400 MG capsule Take 400-800 mg by mouth See admin instructions. Take 400 mg by mouth in the morning and 800 mg in the evening   Yes [provider]  hydroxypropyl methylcellulose / hypromellose (ISOPTO TEARS / GONIOVISC) 2.5 % ophthalmic solution 1 drop 3 (three) times daily as needed for dry eyes.   Yes [provider]  insulin detemir (LEVEMIR) 100 UNIT/ML injection Inject 10-62 Units into the skin See admin instructions. Take 62units in the am and 10units in the pm   Yes [provider]  levocetirizine (XYZAL) 5 MG tablet Take 5 mg by mouth every evening.   Yes [provider]  lidocaine (LIDODERM) 5 % Place 1 patch onto the skin daily. Remove & Discard patch within 12 hours or as directed by MD Patient taking differently: Place 1 patch onto the skin daily as needed (pain).  05/17/18  Yes Shelly Coss, MD  morphine (MSIR) 15 MG tablet Take 0.5 tablets (7.5 mg total) by mouth every 6 (six) hours as needed for severe pain. 06/12/18  Yes [name], MD  Multiple Vitamin (MULTIVITAMIN) tablet Take 1 tablet by mouth daily.   Yes [provider]  ondansetron (ZOFRAN) 8 MG tablet Take 8 mg by mouth 2 (two) times daily as needed for nausea or vomiting.   Yes [provider]  pantoprazole (PROTONIX) 40 MG tablet Take 1 tablet (40 mg total) by mouth daily at 6 (six) AM. 06/12/18  Yes [name], MD  sodium  chloride 0.9 % injection Inject 10 mLs into the vein daily. 06/12/18  Yes [name], MD  tamsulosin (FLOMAX) 0.4 MG CAPS capsule TAKE 1 CAPSULE(0.4 MG) BY MOUTH DAILY AFTER SUPPER Patient taking differently: Take 0.4 mg by mouth daily after supper.  03/12/18  Yes [name], MD  diphenoxylate-atropine  (LOMOTIL) 2.5-0.025 MG tablet Take 1 tablet by mouth 4 (four) times daily as needed for diarrhea or loose stools. Patient not taking: Reported on 06/12/2018 05/26/18   Alla Feeling, NP  HYDROcodone-acetaminophen (NORCO) 5-325 MG tablet Take 1 tablet by mouth every 6 (six) hours as needed for moderate pain. Patient not taking: Reported on 06/19/2018 06/02/18   [name], MD  lidocaine-prilocaine (EMLA) cream Apply to port site 1 hour prior to access Patient taking differently: Apply 1 application topically once. Apply to port site 1 hour prior to access 01/20/18   [name], MD  nadolol (CORGARD) 20 MG tablet Take 1 tablet (20 mg total) by mouth daily. Patient not taking: Reported on 06/19/2018 06/11/18 06/11/19  Mauri Pole, MD  sodium chloride flush (NS) 0.9 % SOLN Place 10 mLs into feeding tube as needed. Patient not taking: Reported on 06/19/2018 06/12/18   [name], MD   Allergies  Allergen Reactions  . Morphine And Related Other (See Comments)    Patient is recovering from drug addiction and wants to avoid any narcotics   Review of Systems +some abdominal pain.   Physical Exam General this is a 63 year old gentleman  resting comfortably in bed no distress currently HEENT normocephalic atraumatic Pulmonary: Clear to auscultation base   Cardiac: Regular rate and rhythm Abdomen: distended, has ascites  GU: Voiding, occasionally incontinent. Neuro: Awake oriented no focal deficit Vital Signs: BP (!) 97/51   Pulse (!) 103   Temp 98 F (36.7 C) (Oral)   Resp (!) 22   Ht 5\' 11"  (1.803 m)   Wt 100.1 kg   SpO2 92%   BMI 30.78 kg/m  Pain Scale: 0-10   Pain Score: 6    SpO2: SpO2: 92 % O2 Device:SpO2: 92 % O2 Flow Rate: .O2 Flow Rate (L/min): 2 L/min  IO: Intake/output summary:   Intake/Output Summary (Last 24 hours) at 06/22/2018 1342 Last data filed at 06/22/2018 1300 Gross per 24 hour  Intake 4122.05 ml  Output 1100 ml  Net 3022.05 ml    LBM: Last BM Date: 06/21/18  Baseline Weight: Weight: 93.9 kg Most recent weight: Weight: 100.1 kg     Palliative Assessment/Data:   Flowsheet Rows     Most Recent Value  Intake Tab  Referral Department  Hospitalist  Unit at Time of Referral  Intermediate Care Unit  Palliative Care Primary Diagnosis  Cancer  Palliative Care Type  Return patient Palliative Care  Reason for referral  Clarify Goals of Care  Date first seen by Palliative Care  06/22/18  Clinical Assessment  Palliative Performance Scale Score  30%  Pain Max last 24 hours  5  Pain Min Last 24 hours  4  Psychosocial & Spiritual Assessment  Palliative Care Outcomes      Time In: 1300 Time Out: 1400  Time Total: 60 min  Greater than 50%  of this time was spent counseling and coordinating care related to the above assessment and plan.  Signed by: Loistine Chance, MD [phone]  Please contact Palliative Medicine Team phone at [phone] for questions and concerns.  For individual provider: See Shea Evans

## 2018-06-22 NOTE — Progress Notes (Signed)
NAME:  Matthew Lewis, MRN:  [id number], DOB:  10/15/1955, LOS: 3 ADMISSION DATE:  06/19/2018, CONSULTATION DATE:  06/20/2018 REFERRING MD:  Hal Hope, CHIEF COMPLAINT:  Sepsis   Brief History    63 year old obese man with metastatic cholangiocarcinoma admitted 6/12 following a fall He underwent EGD 2 weeks ago with variceal banding.  He was felt to have sepsis, intra-abdominal source with concern for upper GI bleed  He has a history of cholangiocarcinoma-stage IV, history of liver cirrhosis, history of obstructive jaundice with drain placement Past history of falls  Past Medical History   Past Medical History:  Diagnosis Date  . Abscess of anal and rectal regions    horse shoe abscess  . Alcoholic cirrhosis ([place])   . Arthritis    "everywhere; especially in my spine"  . Cirrhosis ([place])    STAGE 1 CIRRHOSIS PATIENT SEES DR NAMDIGAM FOR  . Colon cancer ([place]) 05/2005   TX SURGERY WITH LYMPH NODE REMOVAL  . Diabetes mellitus without complication ([place])    type 2  . DM type 2 with diabetic peripheral neuropathy (HCC)    left foot  . Fatigue   . Fistula, anal    multiple  . History of substance abuse ([place])    last alcohol was 08/2011; last marijuania was 08/2011  . Hypertension    no bp meds in last year  . PTSD (post-traumatic stress disorder)    "severe" HX OF  . Sleep deprivation    Significant Hospital Events   6/12 PCCM consulted for sepsis, had bloody stool. Hgb down to 5.9; consulted GI. Intubated for airway protection, CVL placed. Transfused.  EGD completed.  No active bleeding.  6/13 extubated 6/14 stable but still pressor dependent   Consults:  GI- Interventional radiology-paracentesis for 1 L of fluid  Procedures:  6/12 Paracentesis 1L 6/12 ETT >> 6/13 RT fem CVL 6/12 >> EGD 6/12 >> no active bleeding  Significant Diagnostic Tests:   CT cervical spine/head IMPRESSION: 1. Left infraorbital facial soft tissue swelling without underlying fracture. 2.  Normal CT appearance of the brain for age. No acute intracranial abnormality or trauma to the head. 3. No acute or healing facial fractures. 4. No acute trauma to the cervical spine. 5. Cervical spine fusion at C5-6, C6-7, and C7-T1. 6. Adjacent level disease at C4-5 with bilateral foraminal stenosis.   Micro Data:   Blood cultures 6/12 >> ng Urine culture 6/12 >> ng Antimicrobials:  Vancomycin 6/11 -6/12 Metronidazole 6/11-6/12 Cefepime 6/11 Meropenem 6/12>>  Interim history/subjective:    Low-dose Neo-Synephrine continues. No events over night   Objective   Blood pressure (Abnormal) 90/54, pulse (Abnormal) 103, temperature 98.4 F (36.9 C), temperature source Oral, resp. rate 13, height 5\' 11"  (1.803 m), weight 100.1 kg, SpO2 98 %.        Intake/Output Summary (Last 24 hours) at 06/22/2018 0917 Last data filed at 06/22/2018 7035 Gross per 24 hour  Intake 3772.14 ml  Output 1550 ml  Net 2222.14 ml   Filed Weights   06/19/18 0316 06/20/18 0500 06/21/18 0500  Weight: 93.9 kg 99.1 kg 100.1 kg    Examination:  General this is a 63 year old white male he is resting comfortably in bed distress currently HEENT normocephalic atraumatic Pulmonary: Clear to auscultation base no accessory Cardiac: Regular rate and rhythm Abdomen: Obese, positive ascites, she is dullness, tolerating diet.  No pain. GU: Voiding, occasionally incontinent. Extremities: Warm and dry brisk capillary refill Neuro: Awake oriented no focal deficit  Resolved  Hospital Problem list    Hyponatremia Metabolic acidosis with lactic acidosis  Assessment & Plan:  Septic shock -Source remains unclear, cultures negative, ascitic fluid did not appear infected Plan Day 5 antibiotics, #4 meropenem.  He is clinically better, will complete 3 more days of antibiotics then discontinue  Ongoing hypotension -Not convinced this represents ongoing sepsis -Likely a component of third spacing -He remains pressor  dependent Plan Add midodrine Check random cortisol MAP goal > 65  Fall with back pain -No evidence of fracture or lesions on CT abdomen to account for back pain Plan Cont Neurontin and morphine    GI bleed.  Super imposed on chronic anemia. Presume lower GIB  -EGD neg, varix obliterated Plan Protonix twice daily Transfuse for hemoglobin less than 7 or symptomatic retention A.m. CBC  Stave IV Cholangiocarcinoma -2 drains in place -CT scan of the abdomen did not reveal progressive disease Plan Cont drain management per IR  Liver cirrhosis with ascites -Status post paracentesis for 1 L of fluid on 6/12 Plan Undergoes weekly paracentesis. We can repeat this later in the week  Fluid and electrolyte imbalance: Hypophosphatemia Plan Replace and recheck  Diabetes Has had episodes of hypoglycemia, currently glycemic control is acceptable Plan Continuing Levemir 20 units daily Continuing sliding scale insulin  History of PTSD History of polysubstance abuse Plan Supportive care    Best practice:  Diet: N.p.o. Pain/Anxiety/Delirium protocol (if indicated): Opiates VAP protocol (if indicated): In place DVT prophylaxis: SCD GI prophylaxis: Protonix Glucose control: SSI Mobility: Bedrest Code Status: Partial code Palliative care consult pending Family Communication: None  Disposition: ICU he seems to be improving.  He is still pressor dependent.  I am not convinced he still has active infection.  Do not think he is actively bleeding now. Will add midodrine and check cortisol.   Erick Colace ACNP-BC [place] Pager # [phone] OR # [phone] if no answer    06/22/2018

## 2018-06-23 DIAGNOSIS — C221 Intrahepatic bile duct carcinoma: Secondary | ICD-10-CM

## 2018-06-23 LAB — COMPREHENSIVE METABOLIC PANEL
ALT: 25 U/L (ref 0–44)
AST: 48 U/L — ABNORMAL HIGH (ref 15–41)
Albumin: 2.4 g/dL — ABNORMAL LOW (ref 3.5–5.0)
Alkaline Phosphatase: 255 U/L — ABNORMAL HIGH (ref 38–126)
Anion gap: 7 (ref 5–15)
BUN: 16 mg/dL (ref 8–23)
CO2: 21 mmol/L — ABNORMAL LOW (ref 22–32)
Calcium: 8.1 mg/dL — ABNORMAL LOW (ref 8.9–10.3)
Chloride: 107 mmol/L (ref 98–111)
Creatinine, Ser: 0.43 mg/dL — ABNORMAL LOW (ref 0.61–1.24)
GFR calc Af Amer: >60 mL/min (ref >60)
GFR calc non Af Amer: >60 mL/min (ref >60)
Glucose, Bld: 127 mg/dL — ABNORMAL HIGH (ref 70–99)
Potassium: 4.3 mmol/L (ref 3.5–5.1)
Sodium: 135 mmol/L (ref 135–145)
Total Bilirubin: 8.4 mg/dL — ABNORMAL HIGH (ref 0.3–1.2)
Total Protein: 5.9 g/dL — ABNORMAL LOW (ref 6.5–8.1)

## 2018-06-23 LAB — BPAM RBC
Blood Product Expiration Date: 202006272359
Blood Product Expiration Date: 202006272359
Blood Product Expiration Date: 202006282359
ISSUE DATE / TIME: 202006121638
ISSUE DATE / TIME: 202006121940
ISSUE DATE / TIME: 202006151217
Unit Type and Rh: 6200
Unit Type and Rh: 6200
Unit Type and Rh: 6200

## 2018-06-23 LAB — TYPE AND SCREEN
ABO/RH(D): A POS
Antibody Screen: NEGATIVE
Unit division: 0
Unit division: 0
Unit division: 0

## 2018-06-23 LAB — PROTIME-INR
INR: 1.5 — ABNORMAL HIGH (ref 0.8–1.2)
Prothrombin Time: 18 seconds — ABNORMAL HIGH (ref 11.4–15.2)

## 2018-06-23 LAB — GLUCOSE, CAPILLARY
Glucose-Capillary: 105 mg/dL — ABNORMAL HIGH (ref 70–99)
Glucose-Capillary: 161 mg/dL — ABNORMAL HIGH (ref 70–99)
Glucose-Capillary: 144 mg/dL — ABNORMAL HIGH (ref 70–99)

## 2018-06-23 LAB — PHOSPHORUS: Phosphorus: 2.5 mg/dL (ref 2.5–4.6)

## 2018-06-23 MED ORDER — MIDODRINE HCL 5 MG PO TABS
10.0000 mg | ORAL_TABLET | Freq: Three times a day (TID) | ORAL | Status: DC
  Administered 2018-06-23 – 2018-06-26 (×11): 10 mg via ORAL
  Filled 2018-06-23 (×13): qty 2

## 2018-06-23 NOTE — Evaluation (Signed)
Physical Therapy Evaluation Patient Details Name: Matthew Lewis MRN: [id number] DOB: 1955-07-25 Today's Date: 06/23/2018   History of Present Illness  63 yo male admitted with SIRS. Hx of met cholangiocarcinoma, DM, substance abuse, PTSD, jaundice, falls, neuropathy  Clinical Impression  On eval, pt required Mod-Max assist +2 for mobility. He was able to stand for ~1 minute at bedside with use of a RW. Pt was unable to attempt any pivotal or ambulatory steps on today. R drain leaked quite a bit with activity. Pt stated he is scheduled for a procedure to change out drain today. BP 96/58 after standing. Pt is weak and at risk for falls when mobilizing. Per chart, plan is for home with hospice. Will follow and progress activity as able.     Follow Up Recommendations (home with hospice per palliative)    Equipment Recommendations  None recommended by PT    Recommendations for Other Services       Precautions / Restrictions Precautions Precautions: Fall Precaution Comments: 2 biliary drains (leg bags); low BP Restrictions Weight Bearing Restrictions: No      Mobility  Bed Mobility Overal bed mobility: Needs Assistance Bed Mobility: Supine to Sit;Sit to Supine     Supine to sit: Mod assist;+2 for physical assistance;+2 for safety/equipment Sit to supine: Max assist;+2 for physical assistance;+2 for safety/equipment   General bed mobility comments: Assist for trunk and bil LEs. Utilized bedpad for scooting, positioning. Increased time and effort.  Transfers Overall transfer level: Needs assistance   Transfers: Sit to/from Stand Sit to Stand: Mod assist;+2 physical assistance;+2 safety/equipment;From elevated surface         General transfer comment: Assist to rise/power up, stabilize, control descent.  Ambulation/Gait             General Gait Details: Attempted side steps along side of bed but pt was unable to perform. BP 96/58 (seated) after attempting  steps.  Stairs            Wheelchair Mobility    Modified Rankin (Stroke Patients Only)       Balance Overall balance assessment: Needs assistance         Standing balance support: Bilateral upper extremity supported Standing balance-Leahy Scale: Poor                               Pertinent Vitals/Pain Pain Assessment: Faces Faces Pain Scale: Hurts even more Pain Location: abdomen Pain Descriptors / Indicators: Sore;Discomfort Pain Intervention(s): Monitored during session    Home Living Family/patient expects to be discharged to:: Private residence Living Arrangements: Spouse/significant other;Children Available Help at Discharge: Family Type of Home: House Home Access: Stairs to enter Entrance Stairs-Rails: Psychiatric nurse of Steps: 5 on one entrance, 8 on another bilateral rails Home Layout: Able to live on main level with bedroom/bathroom;Multi-level Home Equipment: Cane - single point;Walker - 2 wheels      Prior Function Level of Independence: Needs assistance   Gait / Transfers Assistance Needed: using RW  ADL's / Homemaking Assistance Needed: assist with ADLs        Hand Dominance        Extremity/Trunk Assessment   Upper Extremity Assessment Upper Extremity Assessment: Generalized weakness    Lower Extremity Assessment Lower Extremity Assessment: Generalized weakness       Communication   Communication: No difficulties(hard to hear speech at times)  Cognition Arousal/Alertness: Awake/alert Behavior During Therapy: [facility] for tasks assessed/performed  Overall Cognitive Status: Within Functional Limits for tasks assessed                                        General Comments      Exercises     Assessment/Plan    PT Assessment Patient needs continued PT services  PT Problem List Decreased strength;Decreased mobility;Decreased activity tolerance;Decreased range of motion;Decreased  balance;Decreased knowledge of use of DME;Pain       PT Treatment Interventions DME instruction;Gait training;Therapeutic exercise;Therapeutic activities;Patient/family education;Balance training;Functional mobility training    PT Goals (Current goals can be found in the Care Plan section)  Acute Rehab PT Goals Patient Stated Goal: none stated PT Goal Formulation: With patient Time For Goal Achievement: 07/07/18 Potential to Achieve Goals: Poor    Frequency Min 3X/week   Barriers to discharge        Co-evaluation               AM-PAC PT "6 Clicks" Mobility  Outcome Measure Help needed turning from your back to your side while in a flat bed without using bedrails?: A Lot Help needed moving from lying on your back to sitting on the side of a flat bed without using bedrails?: A Lot Help needed moving to and from a bed to a chair (including a wheelchair)?: A Lot Help needed standing up from a chair using your arms (e.g., wheelchair or bedside chair)?: A Lot Help needed to walk in hospital room?: Total Help needed climbing 3-5 steps with a railing? : Total 6 Click Score: 10    End of Session   Activity Tolerance: Patient limited by fatigue Patient left: in bed;with call bell/phone within reach;with bed alarm set   PT Visit Diagnosis: History of falling (Z91.81);Muscle weakness (generalized) (M62.81);Difficulty in walking, not elsewhere classified (R26.2);Unsteadiness on feet (R26.81)    Time: 1834-3735 PT Time Calculation (min) (ACUTE ONLY): 16 min   Charges:   PT Evaluation $PT Eval Moderate Complexity: [place], PT Acute Rehabilitation Services Pager: [phone] Office: [phone]

## 2018-06-23 NOTE — Progress Notes (Signed)
Daily Progress Note   Patient Name: SKYLAR PRIEST       Date: 06/23/2018 DOB: 01/19/1955  Age: 63 y.o. MRN#: [id number] Attending Physician: Rigoberto Noel, MD Primary Care Physician: [name], MD Admit Date: 06/19/2018  Reason for Consultation/Follow-up: Establishing goals of care  Subjective: I saw and examined Mr. Subramaniam today.    We discussed clinical course as well as wishes moving forward in regard to care plan in light of his advanced cancer.  We discussed difference between a aggressive medical intervention path and a palliative, comfort focused care path.  He endorses his plan is to work to get medically maximized and then transition home with hospice support.    He has not been able to speak with his wife today as his phone has not been working correctly.  No answer when I attempted to call.      Length of Stay: 4  Current Medications: Scheduled Meds:  . Chlorhexidine Gluconate Cloth  6 each Topical Daily  . DULoxetine  30 mg Oral BID  . fludrocortisone  0.1 mg Oral Daily  . gabapentin  400 mg Oral Daily  . gabapentin  800 mg Oral QHS  . hydrocortisone sod succinate (SOLU-CORTEF) inj  50 mg Intravenous Q12H  . insulin aspart  0-9 Units Subcutaneous TID WC  . insulin detemir  10 Units Subcutaneous QHS  . insulin detemir  20 Units Subcutaneous Daily  . mouth rinse  15 mL Mouth Rinse BID  . midodrine  10 mg Oral TID WC  . pantoprazole (PROTONIX) IV  40 mg Intravenous Q12H  . sodium chloride flush  10-40 mL Intracatheter Q12H    Continuous Infusions: . sodium chloride Stopped (06/22/18 2027)  . meropenem (MERREM) IV 1 g (06/23/18 1609)  . phenylephrine (NEO-SYNEPHRINE) Adult infusion Stopped (06/23/18 1203)    PRN Meds: sodium chloride, acetaminophen  **OR** acetaminophen, iohexol, lidocaine, morphine, ondansetron **OR** ondansetron (ZOFRAN) IV, sodium chloride flush  Physical Exam  General: Alert, awake, in no acute distress.  HEENT: No bruits, no goiter, no JVD Heart: Regular rate and rhythm. No murmur appreciated. Lungs: Fair air movement, clear Abdomen: Soft, distended Ext: No significant edema Skin: Warm and dry Neuro: Grossly intact, nonfocal.          Vital Signs: BP 96/63   Pulse (!) 103  Temp (!) 97.5 F (36.4 C) (Oral)   Resp 11   Ht 5\' 11"  (1.803 m)   Wt 108.3 kg   SpO2 99%   BMI 33.30 kg/m  SpO2: SpO2: 99 % O2 Device: O2 Device: Room Air O2 Flow Rate: O2 Flow Rate (L/min): 2 L/min  Intake/output summary:   Intake/Output Summary (Last 24 hours) at 06/23/2018 1700 Last data filed at 06/23/2018 1243 Gross per 24 hour  Intake 2429.23 ml  Output 1500 ml  Net 929.23 ml   LBM: Last BM Date: 06/21/18 Baseline Weight: Weight: 93.9 kg Most recent weight: Weight: 108.3 kg       Palliative Assessment/Data:    Flowsheet Rows     Most Recent Value  Intake Tab  Referral Department  Hospitalist  Unit at Time of Referral  Intermediate Care Unit  Palliative Care Primary Diagnosis  Cancer  Palliative Care Type  Return patient Palliative Care  Reason for referral  Clarify Goals of Care  Date first seen by Palliative Care  06/22/18  Clinical Assessment  Palliative Performance Scale Score  30%  Pain Max last 24 hours  5  Pain Min Last 24 hours  4  Psychosocial & Spiritual Assessment  Palliative Care Outcomes      Patient Active Problem List   Diagnosis Date Noted  . Secondary esophageal varices without bleeding ([place])   . Idiopathic esophageal varices with bleeding (H[facility])   . SIRS (systemic inflammatory response syndrome) ([place]) 05/12/2018  . Macrocytic anemia 05/12/2018  . Syncope and collapse 05/12/2018  . Hypovolemic shock ([place])   . Upper GI bleed 02/24/2018  . Lactic acidosis 02/24/2018  . Acute  blood loss anemia 02/24/2018  . Port-A-Cath in place 01/28/2018  . Dehydration 12/22/2017  . Bladder outlet obstruction 12/22/2017  . Constipation 12/22/2017  . Bladder outflow obstruction   . Sepsis due to gram-negative bacteria ([place])   . Severe sepsis with acute organ dysfunction due to Gram negative bacteria ([place]) 12/03/2017  . Septic shock ([place])   . Cholangiocarcinoma ([place])   . Hypotension 11/20/2017  . Acute renal failure (ARF) ([place]) 11/20/2017  . Palliative care by specialist   . Segmental colitis ([place]) 11/09/2017  . Elevated lipase 11/09/2017  . Metastatic adenocarcinoma to liver ([place]) 11/06/2017  . Goals of care, counseling/discussion 11/06/2017  . Cirrhosis ([place])   . Malignant neoplasm metastatic to adrenal gland ([place])   . Biliary obstruction due to malignant neoplasm ([place]) 10/23/2017  . Transaminitis 10/23/2017  . Liver lesion 10/23/2017  . History of colon cancer 10/23/2017  . Perirectal abscess 02/24/2017  . Moderate protein-calorie malnutrition ([place]) 02/24/2017  . Sepsis ([place]) 02/24/2017  . Pulmonary nodule 02/24/2017  . Alcoholic cirrhosis of liver without ascites ([place]) 02/24/2017  . Duodenitis 06/13/2016  . Hyponatremia 06/12/2016  . Nausea & vomiting 06/12/2016  . Thrombocytopenia ([place]) 06/12/2016  . Acute gastroenteritis 06/12/2016  . PTSD (post-traumatic stress disorder) 12/02/2012  . Hx of substance abuse- clean several years now 12/02/2012  . Chest pain with moderate risk of acute coronary syndrome 11/30/2012  . Obesity 11/30/2012  . Hypertension 11/30/2012  . Hyperlipemia 11/30/2012  . Type II diabetes mellitus ([place]) 11/30/2012  . CAD (coronary artery disease)- PCI 2001- no evaluation since 11/30/2012    Palliative Care Assessment & Plan   Patient Profile: 63 year old  gentleman with metastatic cholangiocarcinoma admitted 6/12 following a fall.  Concern for sepsis and GI bleed.  Recommendations/Plan:  Mr. Coxe continues to endorse being in  agreement with hospice on discharge.  He would like to continue with current therapies until he is medically maximized and then transition home with hospice support.  His wife discussed this with [name] on 6/15 yesterday and she was in agreement with this plan.  Mr. Aikman would like to focus on getting his drain changed, but would then like to have phone meeting with myself and his wife.  Will plan for this tomorrow afternoon.  Goals of Care and Additional Recommendations:  Code Status:    Code Status Orders  (From admission, onward)         Start     Ordered   06/19/18 0714  Limited resuscitation (code)  Continuous    Comments: Patient ok with Vasopressors.  Question Answer Comment  In the event of cardiac or respiratory ARREST: Initiate Code Blue, Call Rapid Response No   In the event of cardiac or respiratory ARREST: Perform CPR No   In the event of cardiac or respiratory ARREST: Perform Intubation/Mechanical Ventilation No   In the event of cardiac or respiratory ARREST: Use NIPPV/BiPAp only if indicated No   In the event of cardiac or respiratory ARREST: Administer ACLS medications if indicated No   In the event of cardiac or respiratory ARREST: Perform Defibrillation or Cardioversion if indicated No   Comments I verified DNR status by direct conversation with patient who was alert and oriented x 3      06/19/18 0713        Code Status History    Date Active Date Inactive Code Status Order ID Comments User Context   06/19/2018 0654 06/19/2018 0712 DNR [id number]  Rise Patience, MD Inpatient   05/12/2018 2222 05/17/2018 1524 DNR [id number]  Frederik Pear, MD Inpatient   05/12/2018 1212 05/12/2018 2222 Full Code [id number]  Eugenie Filler, MD Inpatient   05/12/2018 0451 05/12/2018 1212 DNR [id number]  Vianne Bulls, MD ED   02/24/2018 1025 02/27/2018 1524 Full Code [id number]  Reubin Milan, MD ED   02/24/2018 0752 02/24/2018 1025 DNR [id number]  Reubin Milan, MD ED    12/22/2017 1644 12/30/2017 1856 DNR [id number]  Eugenie Filler, MD ED   12/03/2017 1153 12/11/2017 1659 DNR [id number]  Desiree Hane, MD Inpatient   11/21/2017 1605 11/24/2017 1856 DNR [id number]  Alma Friendly, MD Inpatient   11/20/2017 1931 11/21/2017 1605 Full Code [id number]  Yaakov Guthrie, MD ED   11/09/2017 0733 11/11/2017 1844 DNR [id number]  Toy Baker, MD ED   10/23/2017 2256 10/29/2017 1951 Full Code [id number]  Etta Quill, DO ED   02/24/2017 1951 02/25/2017 1725 DNR [id number]  Karmen Bongo, MD Inpatient   06/12/2016 2021 06/14/2016 2149 Full Code [id number]  Vianne Bulls, MD ED   11/30/2012 2030 12/02/2012 2057 Full Code [id number]  Louellen Molder, MD Inpatient   08/15/2011 1827 08/19/2011 1821 Full Code [id number]  Kathalene Frames, MD ED   06/05/2011 1927 06/08/2011 1410 Full Code [id number]  Whitaker, Marilynn Rail, RN Inpatient   Advance Care Planning Activity    Advance Directive Documentation     Most Recent Value  Type of Advance Directive  Healthcare Power of Attorney, Living will  Pre-existing out of facility DNR order (yellow form or pink MOST form)  -  "MOST" Form in Place?  -       Prognosis:   < 6 months  Discharge Planning:  Home with Hospice once medically ready.  Plan for meeting via phone with patient and his wife tomorrow to  discuss further.  Care plan was discussed with patient, RN  Thank you for allowing the Palliative Medicine Team to assist in the care of this patient.   Time In: 1530 Time Out: 1610 Total Time 40 Prolonged Time Billed No      Greater than 50%  of this time was spent counseling and coordinating care related to the above assessment and plan.  Micheline Rough, MD  Please contact Palliative Medicine Team phone at [phone] for questions and concerns.

## 2018-06-23 NOTE — Progress Notes (Addendum)
NAME:  Matthew Lewis, MRN:  [id number], DOB:  Jan 15, 1955, LOS: 4 ADMISSION DATE:  06/19/2018, CONSULTATION DATE:  06/20/2018 REFERRING MD:  Hal Hope, CHIEF COMPLAINT:  Sepsis   Brief History    63 year old obese man with metastatic cholangiocarcinoma admitted 6/12 following a fall He underwent EGD 2 weeks ago with variceal banding.  He was felt to have sepsis, intra-abdominal source with concern for upper GI bleed  He has a history of cholangiocarcinoma-stage IV, history of liver cirrhosis, history of obstructive jaundice with drain placement Past history of falls  Past Medical History   Past Medical History:  Diagnosis Date  . Abscess of anal and rectal regions    horse shoe abscess  . Alcoholic cirrhosis ([place])   . Arthritis    "everywhere; especially in my spine"  . Cirrhosis ([place])    STAGE 1 CIRRHOSIS PATIENT SEES DR NAMDIGAM FOR  . Colon cancer ([place]) 05/2005   TX SURGERY WITH LYMPH NODE REMOVAL  . Diabetes mellitus without complication ([place])    type 2  . DM type 2 with diabetic peripheral neuropathy (H[facility])    left foot  . Fatigue   . Fistula, anal    multiple  . History of substance abuse ([place])    last alcohol was 08/2011; last marijuania was 08/2011  . Hypertension    no bp meds in last year  . PTSD (post-traumatic stress disorder)    "severe" HX OF  . Sleep deprivation    Significant Hospital Events   6/12 PCCM consulted for sepsis, had bloody stool. Hgb down to 5.9; consulted GI. Intubated for airway protection, CVL placed. Transfused.  EGD completed.  No active bleeding.  6/13 extubated 6/14 stable but still pressor dependent  6/15 started on stress dose steroids and midodrine; cortisol 12.1; got 1 unit PRBCs 6/16: No new changes.  Stopping antibiotics.  Increasing midodrine to 10 mg 3 times daily continuing stress dose steroids changing phenylephrine goal to maintain mean arterial pressure greater than 60 or systolic pressure greater than 90 triad assuming  primary care  Consults:  GI- Interventional radiology-paracentesis for 1 L of fluid  Procedures:  6/12 Paracentesis 1L 6/12 ETT >> 6/13 RT fem CVL 6/12 >> EGD 6/12 >> no active bleeding  Significant Diagnostic Tests:   CT cervical spine/head IMPRESSION: 1. Left infraorbital facial soft tissue swelling without underlying fracture. 2. Normal CT appearance of the brain for age. No acute intracranial abnormality or trauma to the head. 3. No acute or healing facial fractures. 4. No acute trauma to the cervical spine. 5. Cervical spine fusion at C5-6, C6-7, and C7-T1. 6. Adjacent level disease at C4-5 with bilateral foraminal stenosis.   Micro Data:   Blood cultures 6/12 >> ng Urine culture 6/12 >> ng Antimicrobials:  Vancomycin 6/11 -6/12 Metronidazole 6/11-6/12 Cefepime 6/11 Meropenem 6/12>> 6/16  Interim history/subjective:   No distress remains on low-dose Neo-Synephrine  Objective   Blood pressure 110/69, pulse (Abnormal) 103, temperature (Abnormal) 97.5 F (36.4 C), temperature source Oral, resp. rate 10, height 5' 11"  (1.803 m), weight 108.3 kg, SpO2 99 %.        Intake/Output Summary (Last 24 hours) at 06/23/2018 0850 Last data filed at 06/23/2018 0813 Gross per 24 hour  Intake 3412.25 ml  Output 1500 ml  Net 1912.25 ml   Filed Weights   06/20/18 0500 06/21/18 0500 06/23/18 0500  Weight: 99.1 kg 100.1 kg 108.3 kg    Examination:  General this is a very pleasant 63 year old white  male is currently resting exam and in no acute distress HEENT normocephalic atraumatic no jugular venous distention is appreciated mucous membranes are moist Pulmonary: Diminished bases no accessory use currently on room air Cardiac: Regular rate and rhythm without murmur rub or gallop Abdomen: Soft, shifting dullness to percussion, not distended.  Bowel sounds present.  Percutaneous drain with bilious fluid GU: Clear yellow Neuro: Awake oriented no focal deficits  Extremities: Warm and dry brisk cap refill no significant edema is appreciated.   Resolved Hospital Problem list    Hyponatremia Metabolic acidosis with lactic acidosis  Assessment & Plan:  Septic shock (NOS) -Source remains unclear, cultures negative, ascitic fluid did not appear infected Plan Day 5/5 meropenem  Ongoing hypotension-->suspect element of relative adrenal insuff & third spacing  -Not convinced this represents ongoing sepsis Plan Cont midodrine; increase to 10 tid Cont solucortef and fludrocortisone. Day 1 of 3.  MAP goal >60/SBP goal > 90  Fall with back pain -No evidence of fracture or lesions on CT abdomen to account for back pain Plan As needed neurontin and morphine    GI bleed.  Super imposed on chronic anemia. Presume lower GIB  -EGD neg, varix obliterated Plan Cont protonix bid Transfuse for hgb <7 Trend cbc If were to acutely bleed again would need bleeding scan  Stave IV Cholangiocarcinoma -2 drains in place -CT scan of the abdomen did not reveal progressive disease Plan Cont drain rx per IR  Liver cirrhosis with ascites -Status post paracentesis for 1 L of fluid on 6/12 Plan Undergoes weekly paracentesis. Probably do this around Thursday or sooner if symptoms dictate  Would like to postpone this is much as possible given hypotension  Fluid and electrolyte imbalance: intermittent Plan Monitor and replace as indicated   Diabetes Has had episodes of hypoglycemia, currently glycemic control is acceptable Plan Cont current glycemic regimen   History of PTSD History of polysubstance abuse Plan Supportive care     Best practice:  Diet: reg diet . Pain/Anxiety/Delirium protocol (if indicated): Opiates VAP protocol (if indicated): In place DVT prophylaxis: SCD GI prophylaxis: Protonix Glucose control: SSI Mobility: Bedrest Code Status: Partial code Palliative care consult pending Family Communication: None  Disposition:   Erick Colace ACNP-BC [place] Pager # [phone] OR # [phone] if no answer    06/23/2018   Independently examined pt, evaluated data & formulated above care plan with NP  He remains on 43 mics of Neo-Synephrine Feels much improved, no abdominal guarding or tenderness, both biliary drains have dark bile  Cause for shock remains unclear, cultures negative but complete meropenem for 5 days, was transfused 1 unit PRBC with improvement in hemoglobin from 7-8.2 although no evidence of active bleeding on EGD Started empiric Solu-Cortef 50 every 12+ fludrocortisone for low cortisol level of 12 Increase midodrine to 10 3 times daily DNR issued, appreciate palliative care input, he is agreeable to hospice care  Adriena Manfre V. Elsworth Soho MD

## 2018-06-24 DIAGNOSIS — R180 Malignant ascites: Secondary | ICD-10-CM

## 2018-06-24 LAB — GLUCOSE, CAPILLARY
Glucose-Capillary: 190 mg/dL — ABNORMAL HIGH (ref 70–99)
Glucose-Capillary: 127 mg/dL — ABNORMAL HIGH (ref 70–99)
Glucose-Capillary: 171 mg/dL — ABNORMAL HIGH (ref 70–99)
Glucose-Capillary: 133 mg/dL — ABNORMAL HIGH (ref 70–99)
Glucose-Capillary: 140 mg/dL — ABNORMAL HIGH (ref 70–99)

## 2018-06-24 LAB — COMPREHENSIVE METABOLIC PANEL
ALT: 27 U/L (ref 0–44)
AST: 43 U/L — ABNORMAL HIGH (ref 15–41)
Albumin: 2.2 g/dL — ABNORMAL LOW (ref 3.5–5.0)
Alkaline Phosphatase: 245 U/L — ABNORMAL HIGH (ref 38–126)
Anion gap: 6 (ref 5–15)
BUN: 20 mg/dL (ref 8–23)
CO2: 22 mmol/L (ref 22–32)
Calcium: 8.2 mg/dL — ABNORMAL LOW (ref 8.9–10.3)
Chloride: 107 mmol/L (ref 98–111)
Creatinine, Ser: 0.47 mg/dL — ABNORMAL LOW (ref 0.61–1.24)
GFR calc Af Amer: >60 mL/min (ref >60)
GFR calc non Af Amer: >60 mL/min (ref >60)
Glucose, Bld: 149 mg/dL — ABNORMAL HIGH (ref 70–99)
Potassium: 4.4 mmol/L (ref 3.5–5.1)
Sodium: 135 mmol/L (ref 135–145)
Total Bilirubin: 5.4 mg/dL — ABNORMAL HIGH (ref 0.3–1.2)
Total Protein: 5.7 g/dL — ABNORMAL LOW (ref 6.5–8.1)

## 2018-06-24 LAB — CULTURE, BODY FLUID W GRAM STAIN -BOTTLE: Culture: NO GROWTH

## 2018-06-24 LAB — CULTURE, BLOOD (ROUTINE X 2)
Culture: NO GROWTH
Culture: NO GROWTH
Special Requests: ADEQUATE
Special Requests: ADEQUATE

## 2018-06-24 LAB — PROTIME-INR
INR: 1.4 — ABNORMAL HIGH (ref 0.8–1.2)
Prothrombin Time: 17.1 seconds — ABNORMAL HIGH (ref 11.4–15.2)

## 2018-06-24 MED ORDER — ALBUMIN HUMAN 25 % IV SOLN
25.0000 g | Freq: Once | INTRAVENOUS | Status: AC
  Administered 2018-06-24: 10:00:00 25 g via INTRAVENOUS
  Filled 2018-06-24: qty 100
  Filled 2018-06-24: qty 50

## 2018-06-24 MED ORDER — HYDROCORTISONE NA SUCCINATE PF 100 MG IJ SOLR
50.0000 mg | Freq: Every day | INTRAMUSCULAR | Status: DC
  Administered 2018-06-24 – 2018-06-26 (×3): 50 mg via INTRAVENOUS
  Filled 2018-06-24 (×2): qty 2
  Filled 2018-06-24: qty 1

## 2018-06-24 NOTE — TOC Transition Note (Signed)
Transition of Care [facility]) - CM/SW Discharge Note   Patient Details  Name: Matthew Lewis MRN: [id number] Date of Birth: [birth year]  Transition of Care [facility]) CM/SW Contact:  Lynnell Catalan, RN Phone Number: 06/24/2018, 4:15 PM   Clinical Narrative:     This CM contacted by Palliative MD to discuss with wife about previous home palliative experience and plan moving forward. This CM contacted wife who states that [facility] came to the home for initial palliative visit and the representative was not a great fit for them. She states that they will eventually want [name] in the future but just want someone from [place] with a "lower energy level" to visit them at home. This CM contacted [facility] liaison and explained wife request. [facility] to follow up on situation and contact wife. Marney Doctor RN,BSN [phone]

## 2018-06-24 NOTE — Procedures (Signed)
Paracentesis Procedure Note  Indications: drainage of ascites for comfort.   Procedure Details  Consent: Informed consent was obtained. Risks of the procedure were discussed including: infection, bleeding, pain, bowel perforation.  Maximum sterile technique was used including cap, gloves, hand hygiene, mask and sheet. Skin prep: Chlorhexidine; local anesthetic administered. The abdominal wall was punctured in the left lower quadrant. Fluid was obtained without any difficulties and minimal blood loss.  A dressing was applied to the wound and wound care instructions were provided.   Findings 6000 ml of clear ascitic fluid was obtained. A sample was sent to Pathology for cytology and cell counts, as well as for infection analysis.  Complications:  none        Condition: stable   Erick Colace ACNP-BC [place] Pager # [phone] OR # [phone] if no answer

## 2018-06-24 NOTE — Progress Notes (Addendum)
Daily Progress Note   Patient Name: Matthew Lewis       Date: 06/24/2018 DOB: 09/05/1955  Age: 63 y.o. MRN#: [id number] Attending Physician: Rigoberto Noel, MD Primary Care Physician: [name], MD Admit Date: 06/19/2018  Reason for Consultation/Follow-up: Establishing goals of care  Subjective: I saw and examined Matthew Lewis today.    Reports feeling better following paracentesis today. I discussed with him regarding reconsidering drain placement and he held up his and and stated, "Not interested."  He again endorsed his plan is to work to get medically maximized and then transition home with hospice support.  Reports that he has been speaking with his wife about this plan.    I offered to call his wife to discuss further and he reported he spoke with her earlier and is currently too tired and needs to rest.  He was agreeable to me calling his wife.  I called and was able to reach his wife, Matthew Lewis.  We reviewed his clinical course and his desire to transition home with hospice support.  Matthew Lewis expressed that they were "still discussing" plan for hospice as she did not feel comfortable with agency they were working with for outpatient palliative care.  I talked with her about the fact that they have choice amongst which agency they would like to use for hospice services and she expressed wanting to know their options.  Length of Stay: 5  Current Medications: Scheduled Meds:  . Chlorhexidine Gluconate Cloth  6 each Topical Daily  . DULoxetine  30 mg Oral BID  . fludrocortisone  0.1 mg Oral Daily  . gabapentin  400 mg Oral Daily  . gabapentin  800 mg Oral QHS  . hydrocortisone sod succinate (SOLU-CORTEF) inj  50 mg Intravenous Daily  . insulin aspart  0-9 Units Subcutaneous  TID WC  . insulin detemir  10 Units Subcutaneous QHS  . insulin detemir  20 Units Subcutaneous Daily  . mouth rinse  15 mL Mouth Rinse BID  . midodrine  10 mg Oral TID WC  . pantoprazole (PROTONIX) IV  40 mg Intravenous Q12H  . sodium chloride flush  10-40 mL Intracatheter Q12H    Continuous Infusions: . sodium chloride Stopped (06/22/18 2027)    PRN Meds: sodium chloride, acetaminophen **OR** acetaminophen, iohexol, lidocaine, morphine, ondansetron **OR** ondansetron (  ZOFRAN) IV, sodium chloride flush  Physical Exam  General: Alert, awake, in no acute distress.  HEENT: No bruits, no goiter, no JVD Heart: Regular rate and rhythm. No murmur appreciated. Lungs: Fair air movement, clear Abdomen: Soft, distended Ext: No significant edema Skin: Warm and dry Neuro: Grossly intact, nonfocal.          Vital Signs: BP 101/65   Pulse (!) 103   Temp 97.6 F (36.4 C) (Oral)   Resp 12   Ht 5\' 11"  (1.803 m)   Wt 107.9 kg   SpO2 100%   BMI 33.18 kg/m  SpO2: SpO2: 100 % O2 Device: O2 Device: Room Air O2 Flow Rate: O2 Flow Rate (L/min): 2 L/min  Intake/output summary:   Intake/Output Summary (Last 24 hours) at 06/24/2018 1546 Last data filed at 06/24/2018 1201 Gross per 24 hour  Intake 385.37 ml  Output 1020 ml  Net -634.63 ml   LBM: Last BM Date: 06/23/18 Baseline Weight: Weight: 93.9 kg Most recent weight: Weight: 107.9 kg       Palliative Assessment/Data:    Flowsheet Rows     Most Recent Value  Intake Tab  Referral Department  Hospitalist  Unit at Time of Referral  Intermediate Care Unit  Palliative Care Primary Diagnosis  Cancer  Palliative Care Type  Return patient Palliative Care  Reason for referral  Clarify Goals of Care  Date first seen by Palliative Care  06/22/18  Clinical Assessment  Palliative Performance Scale Score  30%  Pain Max last 24 hours  5  Pain Min Last 24 hours  4  Psychosocial & Spiritual Assessment  Palliative Care Outcomes       Patient Active Problem List   Diagnosis Date Noted  . Malignant ascites   . Secondary esophageal varices without bleeding ([place])   . Idiopathic esophageal varices with bleeding (H[facility])   . SIRS (systemic inflammatory response syndrome) ([place]) 05/12/2018  . Macrocytic anemia 05/12/2018  . Syncope and collapse 05/12/2018  . Hypovolemic shock ([place])   . Upper GI bleed 02/24/2018  . Lactic acidosis 02/24/2018  . Acute blood loss anemia 02/24/2018  . Port-A-Cath in place 01/28/2018  . Dehydration 12/22/2017  . Bladder outlet obstruction 12/22/2017  . Constipation 12/22/2017  . Bladder outflow obstruction   . Sepsis due to gram-negative bacteria ([place])   . Severe sepsis with acute organ dysfunction due to Gram negative bacteria ([place]) 12/03/2017  . Septic shock ([place])   . Cholangiocarcinoma ([place])   . Hypotension 11/20/2017  . Acute renal failure (ARF) ([place]) 11/20/2017  . Palliative care by specialist   . Segmental colitis ([place]) 11/09/2017  . Elevated lipase 11/09/2017  . Metastatic adenocarcinoma to liver ([place]) 11/06/2017  . Goals of care, counseling/discussion 11/06/2017  . Cirrhosis ([place])   . Malignant neoplasm metastatic to adrenal gland ([place])   . Biliary obstruction due to malignant neoplasm ([place]) 10/23/2017  . Transaminitis 10/23/2017  . Liver lesion 10/23/2017  . History of colon cancer 10/23/2017  . Perirectal abscess 02/24/2017  . Moderate protein-calorie malnutrition ([place]) 02/24/2017  . Sepsis ([place]) 02/24/2017  . Pulmonary nodule 02/24/2017  . Alcoholic cirrhosis of liver without ascites ([place]) 02/24/2017  . Duodenitis 06/13/2016  . Hyponatremia 06/12/2016  . Nausea & vomiting 06/12/2016  . Thrombocytopenia ([place]) 06/12/2016  . Acute gastroenteritis 06/12/2016  . PTSD (post-traumatic stress disorder) 12/02/2012  . Hx of substance abuse- clean several years now 12/02/2012  . Chest pain with moderate risk of acute coronary syndrome 11/30/2012  .  Obesity 11/30/2012  .  Hypertension 11/30/2012  . Hyperlipemia 11/30/2012  . Type II diabetes mellitus ([place]) 11/30/2012  . CAD (coronary artery disease)- PCI 2001- no evaluation since 11/30/2012    Palliative Care Assessment & Plan   Patient Profile: 63 year old  gentleman with metastatic cholangiocarcinoma admitted 6/12 following a fall.  Concern for sepsis and GI bleed.  Recommendations/Plan:  Matthew Lewis continues to endorse being in agreement with hospice on discharge.  He would like to continue with current therapies until he is medically maximized and then transition home with hospice support.  Discussed with patient's wife today and she would like to know what options they have for home hospice agencies.  I placed a referral to care management to help facilitate.  Goals of Care and Additional Recommendations:  Code Status:    Code Status Orders  (From admission, onward)         Start     Ordered   06/19/18 0714  Limited resuscitation (code)  Continuous    Comments: Patient ok with Vasopressors.  Question Answer Comment  In the event of cardiac or respiratory ARREST: Initiate Code Blue, Call Rapid Response No   In the event of cardiac or respiratory ARREST: Perform CPR No   In the event of cardiac or respiratory ARREST: Perform Intubation/Mechanical Ventilation No   In the event of cardiac or respiratory ARREST: Use NIPPV/BiPAp only if indicated No   In the event of cardiac or respiratory ARREST: Administer ACLS medications if indicated No   In the event of cardiac or respiratory ARREST: Perform Defibrillation or Cardioversion if indicated No   Comments I verified DNR status by direct conversation with patient who was alert and oriented x 3      06/19/18 0713        Code Status History    Date Active Date Inactive Code Status Order ID Comments User Context   06/19/2018 0654 06/19/2018 0712 DNR [id number]  Rise Patience, MD Inpatient   05/12/2018 2222 05/17/2018 1524 DNR [id number]  Frederik Pear, MD Inpatient   05/12/2018 1212 05/12/2018 2222 Full Code [id number]  Eugenie Filler, MD Inpatient   05/12/2018 0451 05/12/2018 1212 DNR [id number]  Vianne Bulls, MD ED   02/24/2018 1025 02/27/2018 1524 Full Code [id number]  Reubin Milan, MD ED   02/24/2018 0752 02/24/2018 1025 DNR [id number]  Reubin Milan, MD ED   12/22/2017 1644 12/30/2017 1856 DNR [id number]  Eugenie Filler, MD ED   12/03/2017 1153 12/11/2017 1659 DNR [id number]  Desiree Hane, MD Inpatient   11/21/2017 1605 11/24/2017 1856 DNR [id number]  Alma Friendly, MD Inpatient   11/20/2017 1931 11/21/2017 1605 Full Code [id number]  Yaakov Guthrie, MD ED   11/09/2017 0733 11/11/2017 1844 DNR [id number]  Toy Baker, MD ED   10/23/2017 2256 10/29/2017 1951 Full Code [id number]  Etta Quill, DO ED   02/24/2017 1951 02/25/2017 1725 DNR [id number]  Karmen Bongo, MD Inpatient   06/12/2016 2021 06/14/2016 2149 Full Code [id number]  Vianne Bulls, MD ED   11/30/2012 2030 12/02/2012 2057 Full Code [id number]  Louellen Molder, MD Inpatient   08/15/2011 1827 08/19/2011 1821 Full Code [id number]  Kathalene Frames, MD ED   06/05/2011 1927 06/08/2011 1410 Full Code [id number]  Whitaker, Marilynn Rail, RN Inpatient   Advance Care Planning Activity    Advance Directive Documentation     Most Recent Value  Type of Advance Directive  Healthcare Power of [place], Living will  Pre-existing out of facility DNR order (yellow form or pink MOST form)  -  "MOST" Form in Place?  -       Prognosis:   < 6 months  Discharge Planning: Home with Hospice once medically ready.  Wife would like to know options for home hospice agency prior to making decision.  Appreciate care management assistance.  Care plan was discussed with patient, RN  Thank you for allowing the Palliative Medicine Team to assist in the care of this patient.   Time In: 1450 Time Out: 1530 Total Time 40 Prolonged Time Billed No      Greater than 50%  of this  time was spent counseling and coordinating care related to the above assessment and plan.  Micheline Rough, MD  Please contact Palliative Medicine Team phone at [phone] for questions and concerns.

## 2018-06-24 NOTE — Progress Notes (Signed)
NAME:  Matthew Lewis, MRN:  [id number], DOB:  [birth year], LOS: 5 ADMISSION DATE:  06/19/2018, CONSULTATION DATE:  06/20/2018 REFERRING MD:  Hal Hope, CHIEF COMPLAINT:  Sepsis   Brief History    63 year old obese man with metastatic cholangiocarcinoma admitted 6/12 following a fall He underwent EGD 2 weeks ago with variceal banding.  He was felt to have sepsis, intra-abdominal source with concern for upper GI bleed  He has a history of cholangiocarcinoma-stage IV, history of liver cirrhosis, history of obstructive jaundice with drain placement Past history of falls  Past Medical History   Past Medical History:  Diagnosis Date  . Abscess of anal and rectal regions    horse shoe abscess  . Alcoholic cirrhosis ([place])   . Arthritis    "everywhere; especially in my spine"  . Cirrhosis ([place])    STAGE 1 CIRRHOSIS PATIENT SEES DR NAMDIGAM FOR  . Colon cancer ([place]) 05/2005   TX SURGERY WITH LYMPH NODE REMOVAL  . Diabetes mellitus without complication ([place])    type 2  . DM type 2 with diabetic peripheral neuropathy ([facility])    left foot  . Fatigue   . Fistula, anal    multiple  . History of substance abuse ([place])    last alcohol was 08/2011; last marijuania was 08/2011  . Hypertension    no bp meds in last year  . PTSD (post-traumatic stress disorder)    "severe" HX OF  . Sleep deprivation    Significant Hospital Events   6/12 PCCM consulted for sepsis, had bloody stool. Hgb down to 5.9; consulted GI. Intubated for airway protection, CVL placed. Transfused.  EGD completed.  No active bleeding.  6/13 extubated 6/14 stable but still pressor dependent  6/15 started on stress dose steroids and midodrine; cortisol 12.1; got 1 unit PRBCs 6/16: No new changes.  Stopping antibiotics.  Increasing midodrine to 10 mg 3 times daily continuing stress dose steroids changing phenylephrine goal to maintain mean arterial pressure greater than 60 or systolic pressure greater than 90 6/17: Off from  pressors.  Blood pressure at goal with systolic in the 37J.  Awaiting percutaneous drain exchange per interventional radiology.  Planning for therapeutic paracentesis due to worsening ascites Consults:  GI- Interventional radiology-paracentesis for 1 L of fluid  Procedures:  6/12 Paracentesis 1L 6/12 ETT >> 6/13 RT fem CVL 6/12 >> EGD 6/12 >> no active bleeding  Significant Diagnostic Tests:   CT cervical spine/head IMPRESSION: 1. Left infraorbital facial soft tissue swelling without underlying fracture. 2. Normal CT appearance of the brain for age. No acute intracranial abnormality or trauma to the head. 3. No acute or healing facial fractures. 4. No acute trauma to the cervical spine. 5. Cervical spine fusion at C5-6, C6-7, and C7-T1. 6. Adjacent level disease at C4-5 with bilateral foraminal stenosis.   Micro Data:   Blood cultures 6/12 >> ng Urine culture 6/12 >> ng Antimicrobials:  Vancomycin 6/11 -6/12 Metronidazole 6/11-6/12 Cefepime 6/11 Meropenem 6/12>> 6/16  Interim history/subjective:   Abdomen more swollen.  Will need paracentesis today  Objective   Blood pressure 95/62, pulse (Abnormal) 103, temperature (Abnormal) 97.5 F (36.4 C), temperature source Oral, resp. rate (Abnormal) 9, height 5\' 11"  (1.803 m), weight 107.9 kg, SpO2 100 %.        Intake/Output Summary (Last 24 hours) at 06/24/2018 0852 Last data filed at 06/24/2018 0600 Gross per 24 hour  Intake 561.67 ml  Output 1020 ml  Net -458.33 ml   [employer]  06/21/18 0500 06/23/18 0500 06/24/18 0500  Weight: 100.1 kg 108.3 kg 107.9 kg    Examination: General Pleasant 63 year old white male he is resting in bed and in no acute distress HEENT normocephalic atraumatic no jugular venous distention is appreciated his mucous membranes are moist Pulmonary: Diminished bases no accessory use no oxygen requirements Cardiac: Regular rate and rhythm without murmur rub or gallop Abdomen: More firm  and distended, shifting dullness to percussion.  Hypoactive.  Worsening ascites compared to exam prior.  Biliary drains in place, drain site leaking. Extremities: Warm and dry brisk capillary refill Neuro: Awake, follows commands, no focal deficits appreciated. GU: Clear yellow   Resolved Hospital Problem list    Hyponatremia Metabolic acidosis with lactic acidosis Septic shock, all cultures negative.  Completed 5 days of meropenem on 6/16 Assessment & Plan:    Ongoing hypotension-->suspect element of relative adrenal insuff & third spacing  -Not convinced this represents ongoing sepsis -Antibiotics completed, pressors now off Plan Continue midodrine 10 mg 3 times daily  Day #2 Solu-Cortef and fludrocortisone, will discontinue after 3 days  Systolic blood pressure goal greater than 90, mean arterial pressures goal greater than 60  He will likely need albumin during paracentesis today we will order   Fall with back pain -No evidence of fracture or lesions on CT abdomen to account for back pain Plan Continue as needed Neurontin and morphine  GI bleed.  Super imposed on chronic anemia. Presume lower GIB  -EGD neg, varix obliterated Plan Continue Protonix twice daily  Transfuse for hemoglobin less than 7  If he were to bleed again would need bleeding scan    Stave IV Cholangiocarcinoma -2 drains in place -CT scan of the abdomen did not reveal progressive disease Plan Followed by interventional radiology  Liver cirrhosis with ascites -Status post paracentesis for 1 L of fluid on 6/12 Plan Last paracentesis was 5 days ago, his abdomen is very taut and distended typically has about 5 L pulled off weekly  We will perform therapeutic paracentesis later today   Fluid and electrolyte imbalance: intermittent Plan Monitor and replace as indicated  Diabetes Has had episodes of hypoglycemia, currently glycemic control is acceptable Plan No change in current glycemic regimen   History of PTSD History of polysubstance abuse Plan Continuing supportive care  Profound physical deconditioning and malnutrition Plan Continuing nutritional support and physical therapy   Best practice:  Diet: reg diet . Pain/Anxiety/Delirium protocol (if indicated): Opiates VAP protocol (if indicated): In place DVT prophylaxis: SCD GI prophylaxis: Protonix Glucose control: SSI Mobility: Bedrest Code Status: Partial code Palliative care consult pending Family Communication: None  Disposition: He is improving slowly.  He is off pressors now.  His abdomen is more swollen, I think he needs therapeutic paracentesis today.  He is supposed to have his drain exchanged later today by interventional radiology.Appreciate palliative care assistance.  They have been unable to engage the family.  I am going to ask internal medicine to resume care  Erick Colace ACNP-BC [place] Pager # [phone] OR # [phone] if no answer   Clementeen Graham MD

## 2018-06-25 ENCOUNTER — Inpatient Hospital Stay (HOSPITAL_COMMUNITY): Payer: Medicare Other

## 2018-06-25 ENCOUNTER — Encounter (HOSPITAL_COMMUNITY): Payer: Self-pay | Admitting: Interventional Radiology

## 2018-06-25 DIAGNOSIS — K831 Obstruction of bile duct: Secondary | ICD-10-CM

## 2018-06-25 DIAGNOSIS — K264 Chronic or unspecified duodenal ulcer with hemorrhage: Secondary | ICD-10-CM

## 2018-06-25 HISTORY — PX: IR EXCHANGE BILIARY DRAIN: IMG6046

## 2018-06-25 LAB — CBC
HCT: 27 % — ABNORMAL LOW (ref 39.0–52.0)
Hemoglobin: 8.5 g/dL — ABNORMAL LOW (ref 13.0–17.0)
MCH: 28.1 pg (ref 26.0–34.0)
MCHC: 31.5 g/dL (ref 30.0–36.0)
MCV: 89.4 fL (ref 80.0–100.0)
Platelets: 175 10*3/uL (ref 150–400)
RBC: 3.02 MIL/uL — ABNORMAL LOW (ref 4.22–5.81)
RDW: 17.2 % — ABNORMAL HIGH (ref 11.5–15.5)
WBC: 8.8 10*3/uL (ref 4.0–10.5)
nRBC: 0 % (ref 0.0–0.2)

## 2018-06-25 LAB — GLUCOSE, CAPILLARY
Glucose-Capillary: 84 mg/dL (ref 70–99)
Glucose-Capillary: 79 mg/dL (ref 70–99)
Glucose-Capillary: 75 mg/dL (ref 70–99)
Glucose-Capillary: 82 mg/dL (ref 70–99)

## 2018-06-25 LAB — COMPREHENSIVE METABOLIC PANEL
ALT: 29 U/L (ref 0–44)
AST: 43 U/L — ABNORMAL HIGH (ref 15–41)
Albumin: 2.4 g/dL — ABNORMAL LOW (ref 3.5–5.0)
Alkaline Phosphatase: 279 U/L — ABNORMAL HIGH (ref 38–126)
Anion gap: 7 (ref 5–15)
BUN: 18 mg/dL (ref 8–23)
CO2: 22 mmol/L (ref 22–32)
Calcium: 8.3 mg/dL — ABNORMAL LOW (ref 8.9–10.3)
Chloride: 108 mmol/L (ref 98–111)
Creatinine, Ser: 0.43 mg/dL — ABNORMAL LOW (ref 0.61–1.24)
GFR calc Af Amer: >60 mL/min (ref >60)
GFR calc non Af Amer: >60 mL/min (ref >60)
Glucose, Bld: 109 mg/dL — ABNORMAL HIGH (ref 70–99)
Potassium: 3.6 mmol/L (ref 3.5–5.1)
Sodium: 137 mmol/L (ref 135–145)
Total Bilirubin: 4.2 mg/dL — ABNORMAL HIGH (ref 0.3–1.2)
Total Protein: 6 g/dL — ABNORMAL LOW (ref 6.5–8.1)

## 2018-06-25 LAB — AMMONIA: Ammonia: 65 umol/L — ABNORMAL HIGH (ref 9–35)

## 2018-06-25 MED ORDER — IOHEXOL 300 MG/ML  SOLN
50.0000 mL | Freq: Once | INTRAMUSCULAR | Status: AC | PRN
  Administered 2018-06-25: 25 mL

## 2018-06-25 MED ORDER — MORPHINE SULFATE (PF) 2 MG/ML IV SOLN
2.0000 mg | INTRAVENOUS | Status: DC | PRN
  Administered 2018-06-25 – 2018-06-26 (×4): 2 mg via INTRAVENOUS
  Filled 2018-06-25 (×5): qty 1

## 2018-06-25 MED ORDER — LIDOCAINE HCL 1 % IJ SOLN
INTRAMUSCULAR | Status: AC
  Filled 2018-06-25: qty 20

## 2018-06-25 MED ORDER — MORPHINE SULFATE (PF) 2 MG/ML IV SOLN
1.0000 mg | INTRAVENOUS | Status: DC | PRN
  Administered 2018-06-25: 1 mg via INTRAVENOUS
  Filled 2018-06-25: qty 1

## 2018-06-25 NOTE — Progress Notes (Signed)
PROGRESS NOTE    Matthew Lewis  OIZ:[id number] DOB: 09-Nov-1955 DOA: 06/19/2018 PCP: [name], MD    Brief Narrative:  63 year old male who presented after a mechanical fall.  He does have the significant past medical history for cholangiocarcinoma stage IV, liver cirrhosis, history of GI bleed, obstructive jaundice status post drain placed, and type 2 diabetes mellitus.  The patient was not doing well for last 24 hours prior to hospitalization, decreased p.o. intake and increased abdominal discomfort, on the day of admission he fell while trying to ambulate, positive facial trauma but no loss of consciousness.  On his initial physical examination his blood pressure was 88/55, pulse rate 114, respiratory rate 25, temperature 39.3 C, his lungs are clear to auscultation bilaterally, heart S1-S2 present and rhythmic, the abdomen was distended, biliary drain in place, no lower extremity edema.  Patient was admitted to the hospital working diagnosis of suspected sepsis unclear origin.   Patient developed septic shock, required vasopressors and invasive mechanical ventilation.  He was mechanical ventilation June 13, and on June 17 he was weaned off vasopressors.   He has been placed on midodrine for hypotension.  Assessment & Plan:   Principal Problem:   SIRS (systemic inflammatory response syndrome) (HCC) Active Problems:   Type II diabetes mellitus (HCC)   CAD (coronary artery disease)- PCI 2001- no evaluation since   Metastatic adenocarcinoma to liver (HCC)   Cholangiocarcinoma (HCC)   Malignant ascites   1. Septic shock. Patient is now is off vasopressors, no source of sepsis was identified, now has completed antibiotic therapy, systolic blood pressure this am 95 to 112 mmHg. He did received IV albumin yesterday. Today clinically intravascular volume depleted. Continue with midodrine, fludrocortisone and hydrocortisone  2. Stage IV cholangiocarcinoma. AST 43, ALT 29, T  Bil 4,2, ammonia 65, with INR of 1,4. No encephalopathy. Biliary drains were exchange and advanced into the cbd, after replacement noted bloody drainage, drains have been cap for now. Continue close monitoring in patient, follow on H&H in am. Once patient is more stable plan for home with hospice.   3. Liver cirrhosis with ascites. Patient is sp large volume paracenthesis yesterday 6L, received IV albumin. This am abdomen continue to be distended and tender. Not tolerating po morphine very well, will change to IV morphine as needed.   4. T2DM. Patient has been on basal insulin 30 units daily and sliding scale, fasting glucose 279 this am, will continue close glucose monitoring patient with poor oral intake.   5. Acute blood loss anemia due to lower GI bleed. Hgb and Hct stable at 8,5 and 27.0. Will continue close follow up of cell count.   6. Obesity. BMI is 31,6.   DVT prophylaxis: scd   Code Status: dnr  Family Communication: no family at the bedside  Disposition Plan/ discharge barriers: pending clinically stability before discharge home with hospice.   Body mass index is 31.64 kg/m. Malnutrition Type:      Malnutrition Characteristics:      Nutrition Interventions:     RN Pressure Injury Documentation:     Consultants:   GI   IR  Palliative   Procedures:   Endoscopy  Paracentesis    Antimicrobials:       Subjective: Patient continue to have abdominal pain, tender to palpation, not feeling well in general, no nausea or vomiting. No dyspnea or chest pain.   Objective: Vitals:   06/24/18 2000 06/25/18 0400 06/25/18 0500 06/25/18 0759  BP: (!) 95/50  113/64    Pulse:      Resp: 18 13    Temp: (!) 96.9 F (36.1 C) (!) 97.4 F (36.3 C)  (!) 97.2 F (36.2 C)  TempSrc: Rectal Rectal  Oral  SpO2: 99% 97%    Weight:   102.9 kg   Height:        Intake/Output Summary (Last 24 hours) at 06/25/2018 0904 Last data filed at 06/25/2018 0451 Gross per 24  hour  Intake 285.37 ml  Output 1350 ml  Net -1064.63 ml   Filed Weights   06/23/18 0500 06/24/18 0500 06/25/18 0500  Weight: 108.3 kg 107.9 kg 102.9 kg    Examination:   General: deconditioned and ill looking appearing  Neurology: Awake and alert, non focal  E ENT: positive pallor, positive icterus, oral mucosa moist Cardiovascular: No JVD. S1-S2 present, rhythmic, no gallops, rubs, or murmurs. Trace bilateral lower extremity edema. Pulmonary: vesicular breath sounds bilaterally, adequate air movement, no wheezing, rhonchi or rales. Gastrointestinal. Abdomen distended and tender, no organomegaly, non tender, no rebound or guarding. Biliary drains in place.  Skin. No rashes Musculoskeletal: no joint deformities     Data Reviewed: I have personally reviewed following labs and imaging studies  CBC: Recent Labs  Lab 06/19/18 0330 06/19/18 1145  06/20/18 0347 06/21/18 0553 06/22/18 0514 06/22/18 2131 06/25/18 0447  WBC 15.7* 11.8*  --  10.7* 12.1* 10.8* 14.0* 8.8  NEUTROABS 14.0* 10.2*  --  8.8*  --   --   --   --   HGB 8.0* 5.9*   < > 8.0* 7.5* 7.0* 8.2* 8.5*  HCT 25.4* 19.1*   < > 25.5* 23.8* 22.7* 25.6* 27.0*  MCV 89.4 91.4  --  89.8 91.2 90.8 90.1 89.4  PLT 228 157  --  169 130* 139* 171 175   < > = values in this interval not displayed.   Basic Metabolic Panel: Recent Labs  Lab 06/20/18 0347 06/21/18 0530 06/21/18 0553 06/22/18 0514 06/23/18 0310 06/24/18 0507 06/25/18 0447  NA 139 137  --  135 135 135 137  K 3.4* 3.7  --  3.9 4.3 4.4 3.6  CL 113* 113*  --  109 107 107 108  CO2 22 21*  --  22 21* 22 22  GLUCOSE 63* 104*  --  109* 127* 149* 109*  BUN 13 10  --  12 16 20 18   CREATININE 0.48* 0.56*  --  0.56* 0.43* 0.47* 0.43*  CALCIUM 7.9* 7.8*  --  7.8* 8.1* 8.2* 8.3*  MG 1.6*  --  2.0 1.8  --   --   --   PHOS  --   --   --  1.9* 2.5  --   --    GFR: Estimated Creatinine Clearance: 113.9 mL/min (A) (by C-G formula based on SCr of 0.43 mg/dL (L)).  Liver Function Tests: Recent Labs  Lab 06/19/18 1145 06/20/18 0347 06/23/18 0310 06/24/18 0507 06/25/18 0447  AST 46* 49* 48* 43* 43*  ALT 22 23 25 27 29   ALKPHOS 257* 224* 255* 245* 279*  BILITOT 3.2* 5.9* 8.4* 5.4* 4.2*  PROT 5.9* 6.5 5.9* 5.7* 6.0*  ALBUMIN 2.2* 2.8* 2.4* 2.2* 2.4*   No results for input(s): LIPASE, AMYLASE in the last 168 hours. Recent Labs  Lab 06/25/18 0520  AMMONIA 65*   Coagulation Profile: Recent Labs  Lab 06/19/18 0330 06/19/18 1145 06/20/18 0347 06/23/18 0310 06/24/18 0926  INR 1.5* 1.7* 1.6* 1.5* 1.4*   Cardiac  Enzymes: No results for input(s): CKTOTAL, CKMB, CKMBINDEX, TROPONINI in the last 168 hours. BNP (last 3 results) No results for input(s): PROBNP in the last 8760 hours. HbA1C: No results for input(s): HGBA1C in the last 72 hours. CBG: Recent Labs  Lab 06/24/18 0735 06/24/18 1141 06/24/18 1714 06/24/18 2128 06/25/18 0744  GLUCAP 127* 171* 133* 140* 84   Lipid Profile: No results for input(s): CHOL, HDL, LDLCALC, TRIG, CHOLHDL, LDLDIRECT in the last 72 hours. Thyroid Function Tests: No results for input(s): TSH, T4TOTAL, FREET4, T3FREE, THYROIDAB in the last 72 hours. Anemia Panel: No results for input(s): VITAMINB12, FOLATE, FERRITIN, TIBC, IRON, RETICCTPCT in the last 72 hours.    Radiology Studies: I have reviewed all of the imaging during this hospital visit personally     Scheduled Meds: . Chlorhexidine Gluconate Cloth  6 each Topical Daily  . DULoxetine  30 mg Oral BID  . fludrocortisone  0.1 mg Oral Daily  . gabapentin  400 mg Oral Daily  . gabapentin  800 mg Oral QHS  . hydrocortisone sod succinate (SOLU-CORTEF) inj  50 mg Intravenous Daily  . insulin aspart  0-9 Units Subcutaneous TID WC  . insulin detemir  10 Units Subcutaneous QHS  . insulin detemir  20 Units Subcutaneous Daily  . mouth rinse  15 mL Mouth Rinse BID  . midodrine  10 mg Oral TID WC  . pantoprazole (PROTONIX) IV  40 mg Intravenous  Q12H  . sodium chloride flush  10-40 mL Intracatheter Q12H   Continuous Infusions: . sodium chloride Stopped (06/22/18 2027)     LOS: 6 days        Laquan Beier Gerome Apley, MD

## 2018-06-25 NOTE — Procedures (Signed)
Interventional Radiology Procedure Note  Procedure: Biliary tube change x 2  Complications: None  Estimated Blood Loss: 50 mL  Findings: Both biliary drains pulled back into intrahepatic ducts. Right 12 Fr internal/external biliary drain exchanged and advanced down CBD and formed in duodenum. Left 10 Fr biliary drain removed over wire with immediate brisk bleeding noted from drain exit site at skin. New 10 Fr internal/external biliary drain exchanged and advanced down CBD next to right sided drain and formed in duodenum. After tube replacement, both are draining blood.   In order to try to tamponade bleeding, will cap both drains for now. Suspect that tumor has eroded into bile ducts and exchanging the left drain has stirred up some acute bleeding. Will follow closely while in house. Do not feel that he will benefit from arteriography or embolization given the advanced state of his disease.  Venetia Night. Kathlene Cote, M.D Pager:  [phone]

## 2018-06-25 NOTE — Progress Notes (Signed)
Matthew Lewis   DOB:1955-11-02   RA#:[id number]   SUP#:[id number]  Oncology follow up   Subjective: Pt has been transferred out of ICU to regular floor, off pressor now.  He underwent a bilateral biliary drain tube exchange today.  He is very fatigued, complaints of abdominal pain. He is going home with hospice tomorrow.   Objective:  Vitals:   06/25/18 1000 06/25/18 1418  BP: 112/64 120/66  Pulse:  85  Resp: 18 18  Temp:  98 F (36.7 C)  SpO2: 98% 99%    Body mass index is 31.64 kg/m.  Intake/Output Summary (Last 24 hours) at 06/25/2018 1656 Last data filed at 06/25/2018 1400 Gross per 24 hour  Intake -  Output 2150 ml  Net -2150 ml    Chronic ill appearing   Sclerae unicteric  Oropharynx clear  No peripheral adenopathy  Lungs clear -- no rales or rhonchi  Heart regular rate and rhythm  Abdomen soft, distended, biliary drainage tubes capped     CBG (last 3)  Recent Labs    06/24/18 2128 06/25/18 0744 06/25/18 1348  GLUCAP 140* 84 79     Labs:  Urine Studies No results for input(s): UHGB, CRYS in the last 72 hours.  Invalid input(s): UACOL, UAPR, USPG, UPH, UTP, UGL, [place], UBIL, UNIT, UROB, [place], UEPI, UWBC, Junie Panning [place], [place], [place]  Basic Metabolic Panel: Recent Labs  Lab 06/20/18 0347 06/21/18 0530 06/21/18 0553 06/22/18 0514 06/23/18 0310 06/24/18 0507 06/25/18 0447  NA 139 137  --  135 135 135 137  K 3.4* 3.7  --  3.9 4.3 4.4 3.6  CL 113* 113*  --  109 107 107 108  CO2 22 21*  --  22 21* 22 22  GLUCOSE 63* 104*  --  109* 127* 149* 109*  BUN 13 10  --  12 16 20 18   CREATININE 0.48* 0.56*  --  0.56* 0.43* 0.47* 0.43*  CALCIUM 7.9* 7.8*  --  7.8* 8.1* 8.2* 8.3*  MG 1.6*  --  2.0 1.8  --   --   --   PHOS  --   --   --  1.9* 2.5  --   --    GFR Estimated Creatinine Clearance: 113.9 mL/min (A) (by C-G formula based on SCr of 0.43 mg/dL (L)). Liver Function Tests: Recent Labs  Lab 06/19/18 1145 06/20/18 0347 06/23/18 0310 06/24/18 0507  06/25/18 0447  AST 46* 49* 48* 43* 43*  ALT 22 23 25 27 29   ALKPHOS 257* 224* 255* 245* 279*  BILITOT 3.2* 5.9* 8.4* 5.4* 4.2*  PROT 5.9* 6.5 5.9* 5.7* 6.0*  ALBUMIN 2.2* 2.8* 2.4* 2.2* 2.4*   No results for input(s): LIPASE, AMYLASE in the last 168 hours. Recent Labs  Lab 06/25/18 0520  AMMONIA 65*   Coagulation profile Recent Labs  Lab 06/19/18 0330 06/19/18 1145 06/20/18 0347 06/23/18 0310 06/24/18 0926  INR 1.5* 1.7* 1.6* 1.5* 1.4*    CBC: Recent Labs  Lab 06/19/18 0330 06/19/18 1145  06/20/18 0347 06/21/18 0553 06/22/18 0514 06/22/18 2131 06/25/18 0447  WBC 15.7* 11.8*  --  10.7* 12.1* 10.8* 14.0* 8.8  NEUTROABS 14.0* 10.2*  --  8.8*  --   --   --   --   HGB 8.0* 5.9*   < > 8.0* 7.5* 7.0* 8.2* 8.5*  HCT 25.4* 19.1*   < > 25.5* 23.8* 22.7* 25.6* 27.0*  MCV 89.4 91.4  --  89.8 91.2 90.8 90.1 89.4  PLT 228 157  --  169 130* 139* 171 175   < > = values in this interval not displayed.   Cardiac Enzymes: No results for input(s): CKTOTAL, CKMB, CKMBINDEX, TROPONINI in the last 168 hours. BNP: Invalid input(s): POCBNP CBG: Recent Labs  Lab 06/24/18 1141 06/24/18 1714 06/24/18 2128 06/25/18 0744 06/25/18 1348  GLUCAP 171* 133* 140* 84 79   D-Dimer No results for input(s): DDIMER in the last 72 hours. Hgb A1c No results for input(s): HGBA1C in the last 72 hours. Lipid Profile No results for input(s): CHOL, HDL, LDLCALC, TRIG, CHOLHDL, LDLDIRECT in the last 72 hours. Thyroid function studies No results for input(s): TSH, T4TOTAL, T3FREE, THYROIDAB in the last 72 hours.  Invalid input(s): FREET3 Anemia work up No results for input(s): VITAMINB12, FOLATE, FERRITIN, TIBC, IRON, RETICCTPCT in the last 72 hours. Microbiology Recent Results (from the past 240 hour(s))  Gram stain     Status: None   Collection Time: 06/19/18  2:56 AM   Specimen: Fluid  Result Value Ref Range Status   Specimen Description FLUID  Final   Special Requests NONE  Final    Gram Stain   Final    WBC PRESENT, PREDOMINANTLY MONONUCLEAR NO ORGANISMS SEEN CYTOSPIN SMEAR Performed at [facility], [address], [place], [place] [address]    Report Status 06/22/2018 FINAL  Final  Culture, body fluid-bottle     Status: None   Collection Time: 06/19/18  2:56 AM   Specimen: Fluid  Result Value Ref Range Status   Specimen Description FLUID  Final   Special Requests NONE  Final   Culture   Final    NO GROWTH 5 DAYS Performed at [facility] [address], [place], [place] [address]    Report Status 06/24/2018 FINAL  Final  Culture, blood (Routine x 2)     Status: None   Collection Time: 06/19/18  3:30 AM   Specimen: BLOOD  Result Value Ref Range Status   Specimen Description   Final    BLOOD BLOOD LEFT HAND Performed at [facility] [address] [place], [place] [address]    Special Requests   Final    BOTTLES DRAWN AEROBIC ONLY Blood Culture adequate volume Performed at [facility] [address] [place], [place] [address]    Culture   Final    NO GROWTH 5 DAYS Performed at [facility], [place] [address], [place],  [address]    Report Status 06/24/2018 FINAL  Final  SARS Coronavirus 2 (CEPHEID- Performed in [facility] hospital lab), Hosp Order     Status: None   Collection Time: 06/19/18  3:30 AM   Specimen: Nasopharyngeal Swab  Result Value Ref Range Status   SARS Coronavirus 2 NEGATIVE NEGATIVE Final    Comment: (NOTE) If result is NEGATIVE SARS-CoV-2 target nucleic acids are NOT DETECTED. The SARS-CoV-2 RNA is generally detectable in upper and lower  respiratory specimens during the acute phase of infection. The lowest  concentration of SARS-CoV-2 viral copies this assay can detect is 250  copies / mL. A negative result does not preclude SARS-CoV-2 infection  and should not be used as the sole basis for treatment or other  patient management decisions.  A negative result  may occur with  improper specimen collection / handling, submission of specimen other  than nasopharyngeal swab, presence of viral mutation(s) within the  areas targeted by this assay, and inadequate number  of viral copies  (<250 copies / mL). A negative result must be combined with clinical  observations, patient history, and epidemiological information. If result is POSITIVE SARS-CoV-2 target nucleic acids are DETECTED. The SARS-CoV-2 RNA is generally detectable in upper and lower  respiratory specimens dur ing the acute phase of infection.  Positive  results are indicative of active infection with SARS-CoV-2.  Clinical  correlation with patient history and other diagnostic information is  necessary to determine patient infection status.  Positive results do  not rule out bacterial infection or co-infection with other viruses. If result is PRESUMPTIVE POSTIVE SARS-CoV-2 nucleic acids MAY BE PRESENT.   A presumptive positive result was obtained on the submitted specimen  and confirmed on repeat testing.  While 2019 novel coronavirus  (SARS-CoV-2) nucleic acids may be present in the submitted sample  additional confirmatory testing may be necessary for epidemiological  and / or clinical management purposes  to differentiate between  SARS-CoV-2 and other Sarbecovirus currently known to infect humans.  If clinically indicated additional testing with an alternate test  methodology [phone]) is advised. The SARS-CoV-2 RNA is generally  detectable in upper and lower respiratory sp ecimens during the acute  phase of infection. The expected result is Negative. Fact Sheet for Patients:  StrictlyIdeas.no Fact Sheet for Healthcare Providers: BankingDealers.co.za This test is not yet approved or cleared by the [place] FDA and has been authorized for detection and/or diagnosis of SARS-CoV-2 by FDA under an Emergency Use Authorization (EUA).   This EUA will remain in effect (meaning this test can be used) for the duration of the COVID-19 declaration under Section 564(b)(1) of the Act, 21 U.S.C. section 360bbb-3(b)(1), unless the authorization is terminated or revoked sooner. Performed at [facility], [place] [address], [place], [place] [address]   Culture, blood (Routine x 2)     Status: None   Collection Time: 06/19/18  3:38 AM   Specimen: BLOOD  Result Value Ref Range Status   Specimen Description   Final    BLOOD BLOOD LEFT ARM Performed at [facility] [address], [place], [place] [address]    Special Requests   Final    BOTTLES DRAWN AEROBIC AND ANAEROBIC Blood Culture adequate volume Performed at [facility] [address], [place], [place] [address]    Culture   Final    NO GROWTH 5 DAYS Performed at [facility], [place] [address], [place], [place] [address]    Report Status 06/24/2018 FINAL  Final  Urine culture     Status: None   Collection Time: 06/19/18  4:40 AM   Specimen: Urine, Catheterized  Result Value Ref Range Status   Specimen Description   Final    URINE, CATHETERIZED Performed at [facility] [address] [place], [place] [address]    Special Requests   Final    Immunocompromised Performed at [facility], [place] [address] [place], [place] [address]    Culture   Final    NO GROWTH Performed at [facility], [place] [address], [place],  [address]    Report Status 06/20/2018 FINAL  Final  MRSA PCR Screening     Status: None   Collection Time: 06/19/18  7:00 AM   Specimen: Nasopharyngeal  Result Value Ref Range Status   MRSA by PCR NEGATIVE NEGATIVE Final    Comment:        The GeneXpert MRSA Assay (FDA  approved for NASAL specimens only), is one component of a comprehensive MRSA colonization surveillance program. It is not intended to diagnose MRSA infection nor to  guide or monitor treatment for MRSA infections. Performed at [facility], [place] [address] [place], [place] [address]   Fungus Culture With Stain     Status: None   Collection Time: 06/19/18  4:08 PM   Specimen: Paracentesis; Peritoneal Fluid  Result Value Ref Range Status   Fungus Stain Final report  Final    Comment: (NOTE) Performed At: [facility] [place], [place] [id number] Rush Farmer MD SW:[id number]    Fungus (Mycology) Culture PENDING  Incomplete   Fungal Source PERITONEAL  Corrected    Comment: Performed at [facility], [place] [address] [place],  [address] CORRECTED ON 06/12 AT 1636: PREVIOUSLY REPORTED AS PARACENTESIS   Fungus Culture Result     Status: None   Collection Time: 06/19/18  4:08 PM  Result Value Ref Range Status   Result 1 Comment  Final    Comment: (NOTE) KOH/Calcofluor preparation:  no fungus observed. Performed At: [facility] [place], [place] [id number] Rush Farmer MD EG:[id number]       Studies:  Ir Exchange Biliary Drain  Result Date: 06/25/2018 INDICATION: History of cholangiocarcinoma with prior placement of both right lobe and left lobe internal/external percutaneous biliary drainage catheters. The right-sided catheter has visibly retracted further out of the exit site. EXAM: 1. EXCHANGE OF RIGHT INTERNAL/EXTERNAL BILIARY DRAINAGE CATHETER 2. EXCHANGE OF LEFT INTERNAL/EXTERNAL BILIARY DRAINAGE CATHETER MEDICATIONS: None ANESTHESIA/SEDATION: None FLUOROSCOPY TIME:  Fluoroscopy Time: 7 minutes and 12 seconds. 348 mGy. CONTRAST:  25 mL Omnipaque 371 COMPLICATIONS: None immediate. PROCEDURE: Informed written consent was obtained from the patient after a thorough discussion of the procedural risks, benefits and alternatives. All questions were addressed. Maximal Sterile Barrier Technique was utilized including caps, mask, sterile gowns, sterile gloves,  sterile drape, hand hygiene and skin antiseptic. A timeout was performed prior to the initiation of the procedure. Fluoroscopy was performed the biliary drainage catheters. The catheters were prepped with Betadine and draped. The right biliary drainage catheter was injected with contrast material. The catheter was then cut and removed over a guidewire. A 5 [place] Kumpe catheter was placed over the guidewire and advanced into the biliary tree. The catheter was further advanced down the common bile duct and into the duodenum over a hydrophilic guidewire. A new 12 French internal/external biliary drainage catheter was then advanced. The distal portion of the catheter was formed in the duodenum. The catheter was injected with contrast and a fluoroscopic spot image saved. The catheter was flushed and connected to a gravity drainage bag. The left sided biliary drainage catheter was then injected with contrast. It was cut and removed over a guidewire. Utilizing a 5 French catheter, guidewire access was then obtained down the common bile duct and into the duodenum. Over the guidewire, a new 10 [place] internal/external biliary drainage catheter was advanced. The distal portion of the catheter was formed in the duodenum. The catheter was injected with contrast and a fluoroscopic image saved. The catheter was flushed and connected to a gravity drainage bag. Bilateral biliary tube output was then assessed and decision made to cap both drains. Pressure dressings were applied at the catheter exit sites after applying StatLock retention devices. FINDINGS: Initial fluoroscopy shows significant retraction of both internal/external biliary drainage catheters which are both retracted significantly into the liver parenchyma. Contrast injection  of the right biliary drainage catheter confirms intraparenchymal positioning within a peripheral bile duct in the liver parenchyma. After removing the right biliary drainage catheter, guidewire  access was re-established down the common bile duct and into the duodenum successfully allowing placement of a new internal/external biliary drainage catheter that was formed in the duodenum. There was return minimally blood tinged bile after successful replacement of the right-sided biliary drainage catheter. The left biliary drainage catheter was also in an intraparenchymal position within the left lobe near the confluence with the right lobe. After removing the drain over a guidewire, there was fairly brisk bleeding noted from the catheter exit site at the skin. Etiology of bleeding was not visible with contrast injection of the drain. After advancing access and placing a new left-sided biliary drain to the level of the duodenum, there was note made bloody output from both the right and left biliary drains. For this reason, a decision was made to cap the biliary drains for now to limit blood loss and try to tamponade bleeding. Based on the CT of 06/19/2018, there is visible tumor invading the left portal venous system and main portal vein at the level of the porta hepatis with thrombus in the portal vein. Bleeding today is felt to most likely be originating from tumor eroding into the portal vein and central left biliary tree. Arterial bleeding in this location cannot be entirely excluded. However, a discrete arterial pseudoaneurysm is not visible on the recent CT study. IMPRESSION: Both internal/external biliary drainage catheters have retracted into the liver parenchyma. Both catheters were able to be exchanged and advanced centrally through the common bile duct and formed in the duodenum. During catheter exchange, there was brisk bleeding noted from the left lobe biliary drain catheter exit site. After the left lobe drain was exchange, blood return was also noted from the right biliary drainage catheter. Presume source of bleeding is tumor erosion into the central liver and porta hepatis with tumor likely  invading the portal vein by CT. Given hemobilia from the biliary drains, both drains were capped to try to tamponade bleeding. Electronically Signed   By: Aletta Edouard M.D.   On: 06/25/2018 16:36   Ir Exchange Biliary Drain  Result Date: 06/25/2018 INDICATION: History of cholangiocarcinoma with prior placement of both right lobe and left lobe internal/external percutaneous biliary drainage catheters. The right-sided catheter has visibly retracted further out of the exit site. EXAM: 1. EXCHANGE OF RIGHT INTERNAL/EXTERNAL BILIARY DRAINAGE CATHETER 2. EXCHANGE OF LEFT INTERNAL/EXTERNAL BILIARY DRAINAGE CATHETER MEDICATIONS: None ANESTHESIA/SEDATION: None FLUOROSCOPY TIME:  Fluoroscopy Time: 7 minutes and 12 seconds. 348 mGy. CONTRAST:  25 mL Omnipaque [id number] COMPLICATIONS: None immediate. PROCEDURE: Informed written consent was obtained from the patient after a thorough discussion of the procedural risks, benefits and alternatives. All questions were addressed. Maximal Sterile Barrier Technique was utilized including caps, mask, sterile gowns, sterile gloves, sterile drape, hand hygiene and skin antiseptic. A timeout was performed prior to the initiation of the procedure. Fluoroscopy was performed the biliary drainage catheters. The catheters were prepped with Betadine and draped. The right biliary drainage catheter was injected with contrast material. The catheter was then cut and removed over a guidewire. A 5 [place] Kumpe catheter was placed over the guidewire and advanced into the biliary tree. The catheter was further advanced down the common bile duct and into the duodenum over a hydrophilic guidewire. A new 12 French internal/external biliary drainage catheter was then advanced. The distal portion of the catheter was  formed in the duodenum. The catheter was injected with contrast and a fluoroscopic spot image saved. The catheter was flushed and connected to a gravity drainage bag. The left sided biliary  drainage catheter was then injected with contrast. It was cut and removed over a guidewire. Utilizing a 5 French catheter, guidewire access was then obtained down the common bile duct and into the duodenum. Over the guidewire, a new 10 [place] internal/external biliary drainage catheter was advanced. The distal portion of the catheter was formed in the duodenum. The catheter was injected with contrast and a fluoroscopic image saved. The catheter was flushed and connected to a gravity drainage bag. Bilateral biliary tube output was then assessed and decision made to cap both drains. Pressure dressings were applied at the catheter exit sites after applying StatLock retention devices. FINDINGS: Initial fluoroscopy shows significant retraction of both internal/external biliary drainage catheters which are both retracted significantly into the liver parenchyma. Contrast injection of the right biliary drainage catheter confirms intraparenchymal positioning within a peripheral bile duct in the liver parenchyma. After removing the right biliary drainage catheter, guidewire access was re-established down the common bile duct and into the duodenum successfully allowing placement of a new internal/external biliary drainage catheter that was formed in the duodenum. There was return minimally blood tinged bile after successful replacement of the right-sided biliary drainage catheter. The left biliary drainage catheter was also in an intraparenchymal position within the left lobe near the confluence with the right lobe. After removing the drain over a guidewire, there was fairly brisk bleeding noted from the catheter exit site at the skin. Etiology of bleeding was not visible with contrast injection of the drain. After advancing access and placing a new left-sided biliary drain to the level of the duodenum, there was note made bloody output from both the right and left biliary drains. For this reason, a decision was made to cap  the biliary drains for now to limit blood loss and try to tamponade bleeding. Based on the CT of 06/19/2018, there is visible tumor invading the left portal venous system and main portal vein at the level of the porta hepatis with thrombus in the portal vein. Bleeding today is felt to most likely be originating from tumor eroding into the portal vein and central left biliary tree. Arterial bleeding in this location cannot be entirely excluded. However, a discrete arterial pseudoaneurysm is not visible on the recent CT study. IMPRESSION: Both internal/external biliary drainage catheters have retracted into the liver parenchyma. Both catheters were able to be exchanged and advanced centrally through the common bile duct and formed in the duodenum. During catheter exchange, there was brisk bleeding noted from the left lobe biliary drain catheter exit site. After the left lobe drain was exchange, blood return was also noted from the right biliary drainage catheter. Presume source of bleeding is tumor erosion into the central liver and porta hepatis with tumor likely invading the portal vein by CT. Given hemobilia from the biliary drains, both drains were capped to try to tamponade bleeding. Electronically Signed   By: Aletta Edouard M.D.   On: 06/25/2018 16:36    ASSESSMENT AND PLAN: 1.  Stage IV cholangiocarcinoma 2.  Anemia secondary to GI bleed from duodenal ulcers and varices 3.  Sepsis from unclear source, septic shock resolved  4.  Obstructive jaundice status post bilateral percutaneous biliary drain 5.  Portal vein thrombosis 6.  Cirrhosis 7.  DNR/DNI   Plan -pt and his wife have agreed  home hospice, he will likely go home tomorrow -I spoke with his nurse about his pain meds as needed -I will remain to be his MD when he under hospice care  -continue supportive care, he knows to call me if needed after discharge   [name]  06/25/2018

## 2018-06-25 NOTE — Progress Notes (Signed)
PT Cancellation Note  Patient Details Name: CASE VASSELL MRN: [id number] DOB: [birth year]   Cancelled Treatment:     procedure out of room....Marland KitchenMarland KitchenBiliary tube change x 2 Will attempt to see another day as schedule permits    Rica Koyanagi  PTA Acute  Rehabilitation Services Pager      [phone] Office      [phone]

## 2018-06-25 NOTE — Progress Notes (Signed)
Patient arrived to bed 1537 via bed from ICU. Donne Hazel, RN

## 2018-06-25 NOTE — Progress Notes (Signed)
Daily Progress Note   Patient Name: Matthew Lewis       Date: 06/25/2018 DOB: 03-24-1955  Age: 63 y.o. MRN#: [id number] Attending Physician: Tawni Millers Primary Care Physician: [name], MD Admit Date: 06/19/2018  Reason for Consultation/Follow-up: Establishing goals of care  Subjective: I saw and examined Matthew Lewis today.    Reports being worn out since going for drain replacement.  States, "I had a lot of bleeding."  Denies other needs and wants to rest  I called and was able to reach his wife, Matthew Lewis.  Discussed clinical course overnight including drain replacement with some bleeding noted.  Discussed plan to transition home with hospice, likely in the next day or two depending on his clinical course.  She wants him home with hospice support, but remains concerned with being able to care for him at home long term.  Length of Stay: 6  Current Medications: Scheduled Meds:  . Chlorhexidine Gluconate Cloth  6 each Topical Daily  . DULoxetine  30 mg Oral BID  . fludrocortisone  0.1 mg Oral Daily  . gabapentin  400 mg Oral Daily  . gabapentin  800 mg Oral QHS  . hydrocortisone sod succinate (SOLU-CORTEF) inj  50 mg Intravenous Daily  . insulin aspart  0-9 Units Subcutaneous TID WC  . insulin detemir  10 Units Subcutaneous QHS  . insulin detemir  20 Units Subcutaneous Daily  . mouth rinse  15 mL Mouth Rinse BID  . midodrine  10 mg Oral TID WC  . pantoprazole (PROTONIX) IV  40 mg Intravenous Q12H  . sodium chloride flush  10-40 mL Intracatheter Q12H    Continuous Infusions: . sodium chloride Stopped (06/22/18 2027)    PRN Meds: sodium chloride, acetaminophen **OR** acetaminophen, iohexol, lidocaine, morphine injection, ondansetron **OR** ondansetron  (ZOFRAN) IV, sodium chloride flush  Physical Exam  General: Alert, awake, in no acute distress.  HEENT: No bruits, no goiter, no JVD Heart: Regular rate and rhythm. No murmur appreciated. Lungs: Fair air movement, clear Abdomen: Soft, distended Ext: No significant edema Skin: Warm and dry Neuro: Grossly intact, nonfocal.          Vital Signs: BP 120/66 (BP Location: Right Arm)   Pulse 85   Temp 98 F (36.7 C) (Oral)   Resp 18   Ht 5'  11" (1.803 m)   Wt 102.9 kg   SpO2 99%   BMI 31.64 kg/m  SpO2: SpO2: 99 % O2 Device: O2 Device: Room Air O2 Flow Rate: O2 Flow Rate (L/min): 2 L/min  Intake/output summary:   Intake/Output Summary (Last 24 hours) at 06/25/2018 1755 Last data filed at 06/25/2018 1400 Gross per 24 hour  Intake -  Output 1350 ml  Net -1350 ml   LBM: Last BM Date: 06/23/18 Baseline Weight: Weight: 93.9 kg Most recent weight: Weight: 102.9 kg       Palliative Assessment/Data:    Flowsheet Rows     Most Recent Value  Intake Tab  Referral Department  Hospitalist  Unit at Time of Referral  Intermediate Care Unit  Palliative Care Primary Diagnosis  Cancer  Palliative Care Type  Return patient Palliative Care  Reason for referral  Clarify Goals of Care  Date first seen by Palliative Care  06/22/18  Clinical Assessment  Palliative Performance Scale Score  30%  Pain Max last 24 hours  5  Pain Min Last 24 hours  4  Psychosocial & Spiritual Assessment  Palliative Care Outcomes      Patient Active Problem List   Diagnosis Date Noted  . Malignant ascites   . Secondary esophageal varices without bleeding ([place])   . Idiopathic esophageal varices with bleeding ([facility])   . SIRS (systemic inflammatory response syndrome) ([place]) 05/12/2018  . Macrocytic anemia 05/12/2018  . Syncope and collapse 05/12/2018  . Hypovolemic shock ([place])   . Upper GI bleed 02/24/2018  . Lactic acidosis 02/24/2018  . Acute blood loss anemia 02/24/2018  . Port-A-Cath in place  01/28/2018  . Dehydration 12/22/2017  . Bladder outlet obstruction 12/22/2017  . Constipation 12/22/2017  . Bladder outflow obstruction   . Sepsis due to gram-negative bacteria ([place])   . Severe sepsis with acute organ dysfunction due to Gram negative bacteria ([place]) 12/03/2017  . Septic shock ([place])   . Cholangiocarcinoma ([place])   . Hypotension 11/20/2017  . Acute renal failure (ARF) ([place]) 11/20/2017  . Palliative care by specialist   . Segmental colitis ([place]) 11/09/2017  . Elevated lipase 11/09/2017  . Metastatic adenocarcinoma to liver ([place]) 11/06/2017  . Goals of care, counseling/discussion 11/06/2017  . Cirrhosis ([place])   . Malignant neoplasm metastatic to adrenal gland ([place])   . Biliary obstruction due to malignant neoplasm ([place]) 10/23/2017  . Transaminitis 10/23/2017  . Liver lesion 10/23/2017  . History of colon cancer 10/23/2017  . Perirectal abscess 02/24/2017  . Moderate protein-calorie malnutrition ([place]) 02/24/2017  . Sepsis ([place]) 02/24/2017  . Pulmonary nodule 02/24/2017  . Alcoholic cirrhosis of liver without ascites ([place]) 02/24/2017  . Duodenitis 06/13/2016  . Hyponatremia 06/12/2016  . Nausea & vomiting 06/12/2016  . Thrombocytopenia ([place]) 06/12/2016  . Acute gastroenteritis 06/12/2016  . PTSD (post-traumatic stress disorder) 12/02/2012  . Hx of substance abuse- clean several years now 12/02/2012  . Chest pain with moderate risk of acute coronary syndrome 11/30/2012  . Obesity 11/30/2012  . Hypertension 11/30/2012  . Hyperlipemia 11/30/2012  . Type II diabetes mellitus ([place]) 11/30/2012  . CAD (coronary artery disease)- PCI 2001- no evaluation since 11/30/2012    Palliative Care Assessment & Plan   Patient Profile: 63 year old  gentleman with metastatic cholangiocarcinoma admitted 6/12 following a fall.  Concern for sepsis and GI bleed.  Recommendations/Plan:  Matthew Lewis continues to endorse being in agreement with hospice on discharge.  He would like to  continue with current therapies until he  is medically maximized and then transition home with hospice support.  Discussed with patient's wife today and she is in agreement with plan for home with hospice. They have elected [facility] and I called and discussed with hospice liaison who will reach out to wife this afternoon.  Goals of Care and Additional Recommendations:  Code Status:    Code Status Orders  (From admission, onward)         Start     Ordered   06/19/18 0714  Limited resuscitation (code)  Continuous    Comments: Patient ok with Vasopressors.  Question Answer Comment  In the event of cardiac or respiratory ARREST: Initiate Code Blue, Call Rapid Response No   In the event of cardiac or respiratory ARREST: Perform CPR No   In the event of cardiac or respiratory ARREST: Perform Intubation/Mechanical Ventilation No   In the event of cardiac or respiratory ARREST: Use NIPPV/BiPAp only if indicated No   In the event of cardiac or respiratory ARREST: Administer ACLS medications if indicated No   In the event of cardiac or respiratory ARREST: Perform Defibrillation or Cardioversion if indicated No   Comments I verified DNR status by direct conversation with patient who was alert and oriented x 3      06/19/18 0713        Code Status History    Date Active Date Inactive Code Status Order ID Comments User Context   06/19/2018 0654 06/19/2018 0712 DNR [id number]  Rise Patience, MD Inpatient   05/12/2018 2222 05/17/2018 1524 DNR [id number]  Frederik Pear, MD Inpatient   05/12/2018 1212 05/12/2018 2222 Full Code [id number]  Eugenie Filler, MD Inpatient   05/12/2018 0451 05/12/2018 1212 DNR [id number]  Vianne Bulls, MD ED   02/24/2018 1025 02/27/2018 1524 Full Code [id number]  Reubin Milan, MD ED   02/24/2018 0752 02/24/2018 1025 DNR [id number]  Reubin Milan, MD ED   12/22/2017 1644 12/30/2017 1856 DNR [id number]  Eugenie Filler, MD ED   12/03/2017 1153 12/11/2017  1659 DNR [id number]  Desiree Hane, MD Inpatient   11/21/2017 1605 11/24/2017 1856 DNR [id number]  Alma Friendly, MD Inpatient   11/20/2017 1931 11/21/2017 1605 Full Code [id number]  Yaakov Guthrie, MD ED   11/09/2017 0733 11/11/2017 1844 DNR [id number]  Toy Baker, MD ED   10/23/2017 2256 10/29/2017 1951 Full Code [id number]  Etta Quill, DO ED   02/24/2017 1951 02/25/2017 1725 DNR [id number]  Karmen Bongo, MD Inpatient   06/12/2016 2021 06/14/2016 2149 Full Code [id number]  Vianne Bulls, MD ED   11/30/2012 2030 12/02/2012 2057 Full Code [id number]  Louellen Molder, MD Inpatient   08/15/2011 1827 08/19/2011 1821 Full Code [id number]  Kathalene Frames, MD ED   06/05/2011 1927 06/08/2011 1410 Full Code [id number]  Whitaker, Marilynn Rail, RN Inpatient   Advance Care Planning Activity    Advance Directive Documentation     Most Recent Value  Type of Advance Directive  Healthcare Power of Attorney, Living will  Pre-existing out of facility DNR order (yellow form or pink MOST form)  -  "MOST" Form in Place?  -       Prognosis:   < 6 months- Likely weeks at best  Discharge Planning: Home with Hospice once medically ready.  Wife would like to know options for home hospice agency prior to making decision.  Appreciate care management assistance.  Care plan was discussed with patient, RN  Thank you for allowing the  Palliative Medicine Team to assist in the care of this patient.   Time In: 1300 Time Out: 1340 Total Time 40 Prolonged Time Billed No      Greater than 50%  of this time was spent counseling and coordinating care related to the above assessment and plan.  Micheline Rough, MD  Please contact Palliative Medicine Team phone at [phone] for questions and concerns.

## 2018-06-26 DIAGNOSIS — I2510 Atherosclerotic heart disease of native coronary artery without angina pectoris: Secondary | ICD-10-CM

## 2018-06-26 LAB — CBC WITH DIFFERENTIAL/PLATELET
Abs Immature Granulocytes: 0.05 10*3/uL (ref 0.00–0.07)
Basophils Absolute: 0 10*3/uL (ref 0.0–0.1)
Basophils Relative: 0 %
Eosinophils Absolute: 0.1 10*3/uL (ref 0.0–0.5)
Eosinophils Relative: 1 %
HCT: 22.5 % — ABNORMAL LOW (ref 39.0–52.0)
Hemoglobin: 7.1 g/dL — ABNORMAL LOW (ref 13.0–17.0)
Immature Granulocytes: 0 %
Lymphocytes Relative: 9 %
Lymphs Abs: 1.2 10*3/uL (ref 0.7–4.0)
MCH: 28.5 pg (ref 26.0–34.0)
MCHC: 31.6 g/dL (ref 30.0–36.0)
MCV: 90.4 fL (ref 80.0–100.0)
Monocytes Absolute: 1.1 10*3/uL — ABNORMAL HIGH (ref 0.1–1.0)
Monocytes Relative: 8 %
Neutro Abs: 11.3 10*3/uL — ABNORMAL HIGH (ref 1.7–7.7)
Neutrophils Relative %: 82 %
Platelets: 129 10*3/uL — ABNORMAL LOW (ref 150–400)
RBC: 2.49 MIL/uL — ABNORMAL LOW (ref 4.22–5.81)
RDW: 18.1 % — ABNORMAL HIGH (ref 11.5–15.5)
WBC: 13.7 10*3/uL — ABNORMAL HIGH (ref 4.0–10.5)
nRBC: 0 % (ref 0.0–0.2)

## 2018-06-26 LAB — BASIC METABOLIC PANEL
Anion gap: 8 (ref 5–15)
BUN: 22 mg/dL (ref 8–23)
CO2: 22 mmol/L (ref 22–32)
Calcium: 8 mg/dL — ABNORMAL LOW (ref 8.9–10.3)
Chloride: 104 mmol/L (ref 98–111)
Creatinine, Ser: 0.62 mg/dL (ref 0.61–1.24)
GFR calc Af Amer: >60 mL/min (ref >60)
GFR calc non Af Amer: >60 mL/min (ref >60)
Glucose, Bld: 112 mg/dL — ABNORMAL HIGH (ref 70–99)
Potassium: 4.7 mmol/L (ref 3.5–5.1)
Sodium: 134 mmol/L — ABNORMAL LOW (ref 135–145)

## 2018-06-26 LAB — GLUCOSE, CAPILLARY
Glucose-Capillary: 94 mg/dL (ref 70–99)
Glucose-Capillary: 112 mg/dL — ABNORMAL HIGH (ref 70–99)
Glucose-Capillary: 188 mg/dL — ABNORMAL HIGH (ref 70–99)
Glucose-Capillary: 138 mg/dL — ABNORMAL HIGH (ref 70–99)

## 2018-06-26 MED ORDER — INSULIN DETEMIR 100 UNIT/ML ~~LOC~~ SOLN: 10.0000 [IU] | Freq: Every day | SUBCUTANEOUS | 11 refills | Status: AC

## 2018-06-26 MED ORDER — INSULIN ASPART 100 UNIT/ML ~~LOC~~ SOLN: 0.0000 [IU] | Freq: Three times a day (TID) | SUBCUTANEOUS | 11 refills | Status: AC

## 2018-06-26 MED ORDER — MORPHINE SULFATE 15 MG PO TABS: 7.5000 mg | ORAL_TABLET | ORAL | Status: DC | PRN

## 2018-06-26 MED ORDER — MIDODRINE HCL 10 MG PO TABS: 10.0000 mg | ORAL_TABLET | Freq: Three times a day (TID) | ORAL | 0 refills | Status: AC

## 2018-06-26 MED ORDER — MORPHINE SULFATE 15 MG PO TABS: 7.5000 mg | ORAL_TABLET | ORAL | 0 refills | Status: AC | PRN

## 2018-06-26 NOTE — Progress Notes (Signed)
Referring Physician(s): Feng,Y  Supervising Physician: Aletta Edouard  Patient Status:  [facility] - In-pt  Chief Complaint: Abdominal pain/distension, cholangiocarcinoma   Subjective: Pt appears weak; states abd pain persists but not worsening; home hospice planned    Allergies: Morphine and related  Medications: Prior to Admission medications   Medication Sig Start Date End Date Taking? Authorizing Provider  acetaminophen (TYLENOL) 325 MG tablet Take 650 mg by mouth every 6 (six) hours as needed for mild pain.   Yes [provider]  calcium carbonate (TUMS - DOSED IN MG ELEMENTAL CALCIUM) 500 MG chewable tablet Chew 2 tablets by mouth daily as needed for indigestion or heartburn.   Yes [provider]  celecoxib (CELEBREX) 200 MG capsule Take 200 mg by mouth daily.   Yes [provider]  DULoxetine (CYMBALTA) 30 MG capsule Take 30 mg by mouth 2 (two) times daily.   Yes [provider]  fexofenadine-pseudoephedrine (ALLEGRA-D) 60-120 MG 12 hr tablet Take 1 tablet by mouth daily.   Yes [provider]  gabapentin (NEURONTIN) 400 MG capsule Take 400-800 mg by mouth See admin instructions. Take 400 mg by mouth in the morning and 800 mg in the evening   Yes [provider]  hydroxypropyl methylcellulose / hypromellose (ISOPTO TEARS / GONIOVISC) 2.5 % ophthalmic solution 1 drop 3 (three) times daily as needed for dry eyes.   Yes [provider]  insulin detemir (LEVEMIR) 100 UNIT/ML injection Inject 10-62 Units into the skin See admin instructions. Take 62units in the am and 10units in the pm   Yes [provider]  levocetirizine (XYZAL) 5 MG tablet Take 5 mg by mouth every evening.   Yes [provider]  lidocaine (LIDODERM) 5 % Place 1 patch onto the skin daily. Remove & Discard patch within 12 hours or as directed by MD Patient taking differently: Place 1 patch onto the skin daily as needed (pain).  05/17/18   Yes Shelly Coss, MD  morphine (MSIR) 15 MG tablet Take 0.5 tablets (7.5 mg total) by mouth every 6 (six) hours as needed for severe pain. 06/12/18  Yes [name], MD  Multiple Vitamin (MULTIVITAMIN) tablet Take 1 tablet by mouth daily.   Yes [provider]  ondansetron (ZOFRAN) 8 MG tablet Take 8 mg by mouth 2 (two) times daily as needed for nausea or vomiting.   Yes [provider]  pantoprazole (PROTONIX) 40 MG tablet Take 1 tablet (40 mg total) by mouth daily at 6 (six) AM. 06/12/18  Yes [name], MD  sodium chloride 0.9 % injection Inject 10 mLs into the vein daily. 06/12/18  Yes [name], MD  tamsulosin (FLOMAX) 0.4 MG CAPS capsule TAKE 1 CAPSULE(0.4 MG) BY MOUTH DAILY AFTER SUPPER Patient taking differently: Take 0.4 mg by mouth daily after supper.  03/12/18  Yes [name], MD  diphenoxylate-atropine (LOMOTIL) 2.5-0.025 MG tablet Take 1 tablet by mouth 4 (four) times daily as needed for diarrhea or loose stools. Patient not taking: Reported on 06/12/2018 05/26/18   Alla Feeling, NP  HYDROcodone-acetaminophen (NORCO) 5-325 MG tablet Take 1 tablet by mouth every 6 (six) hours as needed for moderate pain. Patient not taking: Reported on 06/19/2018 06/02/18   [name], MD  lidocaine-prilocaine (EMLA) cream Apply to port site 1 hour prior to access Patient taking differently: Apply 1 application topically once. Apply to port site 1 hour prior to access 01/20/18   [name], MD  nadolol (CORGARD) 20 MG tablet  Take 1 tablet (20 mg total) by mouth daily. Patient not taking: Reported on 06/19/2018 06/11/18 06/11/19  Mauri Pole, MD  sodium chloride flush (NS) 0.9 % SOLN Place 10 mLs into feeding tube as needed. Patient not taking: Reported on 06/19/2018 06/12/18   [name], MD     Vital Signs: BP 112/73 (BP Location: Right Arm)    Pulse 100    Temp 97.8 F (36.6 C) (Oral)    Resp 17    Ht 5' 11"  (1.803 m)    Wt 226 lb 13.7 oz (102.9 kg)    SpO2 100%    BMI 31.64 kg/m    Physical Exam : pt awake but lethargic; abd distended; both biliary drains in place; no active bleeding from insertion sites at this time; small amount thick brown fluid around rt drain site; both drains capped  Imaging: Ir Exchange Biliary Drain  Result Date: 06/25/2018 INDICATION: History of cholangiocarcinoma with prior placement of both right lobe and left lobe internal/external percutaneous biliary drainage catheters. The right-sided catheter has visibly retracted further out of the exit site. EXAM: 1. EXCHANGE OF RIGHT INTERNAL/EXTERNAL BILIARY DRAINAGE CATHETER 2. EXCHANGE OF LEFT INTERNAL/EXTERNAL BILIARY DRAINAGE CATHETER MEDICATIONS: None ANESTHESIA/SEDATION: None FLUOROSCOPY TIME:  Fluoroscopy Time: 7 minutes and 12 seconds. 348 mGy. CONTRAST:  25 mL Omnipaque 631 COMPLICATIONS: None immediate. PROCEDURE: Informed written consent was obtained from the patient after a thorough discussion of the procedural risks, benefits and alternatives. All questions were addressed. Maximal Sterile Barrier Technique was utilized including caps, mask, sterile gowns, sterile gloves, sterile drape, hand hygiene and skin antiseptic. A timeout was performed prior to the initiation of the procedure. Fluoroscopy was performed the biliary drainage catheters. The catheters were prepped with Betadine and draped. The right biliary drainage catheter was injected with contrast material. The catheter was then cut and removed over a guidewire. A 5 [place] Kumpe catheter was placed over the guidewire and advanced into the biliary tree. The catheter was further advanced down the common bile duct and into the duodenum over a hydrophilic guidewire. A new 12 French internal/external biliary drainage catheter was then advanced. The distal portion of the catheter was formed in the duodenum. The catheter was injected with contrast and a fluoroscopic spot image saved. The catheter was flushed and connected to a gravity drainage bag. The  left sided biliary drainage catheter was then injected with contrast. It was cut and removed over a guidewire. Utilizing a 5 French catheter, guidewire access was then obtained down the common bile duct and into the duodenum. Over the guidewire, a new 10 [place] internal/external biliary drainage catheter was advanced. The distal portion of the catheter was formed in the duodenum. The catheter was injected with contrast and a fluoroscopic image saved. The catheter was flushed and connected to a gravity drainage bag. Bilateral biliary tube output was then assessed and decision made to cap both drains. Pressure dressings were applied at the catheter exit sites after applying StatLock retention devices. FINDINGS: Initial fluoroscopy shows significant retraction of both internal/external biliary drainage catheters which are both retracted significantly into the liver parenchyma. Contrast injection of the right biliary drainage catheter confirms intraparenchymal positioning within a peripheral bile duct in the liver parenchyma. After removing the right biliary drainage catheter, guidewire access was re-established down the common bile duct and into the duodenum successfully allowing placement of a new internal/external biliary drainage catheter that was formed in the duodenum. There was return minimally blood tinged bile after successful replacement  of the right-sided biliary drainage catheter. The left biliary drainage catheter was also in an intraparenchymal position within the left lobe near the confluence with the right lobe. After removing the drain over a guidewire, there was fairly brisk bleeding noted from the catheter exit site at the skin. Etiology of bleeding was not visible with contrast injection of the drain. After advancing access and placing a new left-sided biliary drain to the level of the duodenum, there was note made bloody output from both the right and left biliary drains. For this reason, a decision  was made to cap the biliary drains for now to limit blood loss and try to tamponade bleeding. Based on the CT of 06/19/2018, there is visible tumor invading the left portal venous system and main portal vein at the level of the porta hepatis with thrombus in the portal vein. Bleeding today is felt to most likely be originating from tumor eroding into the portal vein and central left biliary tree. Arterial bleeding in this location cannot be entirely excluded. However, a discrete arterial pseudoaneurysm is not visible on the recent CT study. IMPRESSION: Both internal/external biliary drainage catheters have retracted into the liver parenchyma. Both catheters were able to be exchanged and advanced centrally through the common bile duct and formed in the duodenum. During catheter exchange, there was brisk bleeding noted from the left lobe biliary drain catheter exit site. After the left lobe drain was exchange, blood return was also noted from the right biliary drainage catheter. Presume source of bleeding is tumor erosion into the central liver and porta hepatis with tumor likely invading the portal vein by CT. Given hemobilia from the biliary drains, both drains were capped to try to tamponade bleeding. Electronically Signed   By: Aletta Edouard M.D.   On: 06/25/2018 16:36   Ir Exchange Biliary Drain  Result Date: 06/25/2018 INDICATION: History of cholangiocarcinoma with prior placement of both right lobe and left lobe internal/external percutaneous biliary drainage catheters. The right-sided catheter has visibly retracted further out of the exit site. EXAM: 1. EXCHANGE OF RIGHT INTERNAL/EXTERNAL BILIARY DRAINAGE CATHETER 2. EXCHANGE OF LEFT INTERNAL/EXTERNAL BILIARY DRAINAGE CATHETER MEDICATIONS: None ANESTHESIA/SEDATION: None FLUOROSCOPY TIME:  Fluoroscopy Time: 7 minutes and 12 seconds. 348 mGy. CONTRAST:  25 mL Omnipaque 469 COMPLICATIONS: None immediate. PROCEDURE: Informed written consent was obtained from  the patient after a thorough discussion of the procedural risks, benefits and alternatives. All questions were addressed. Maximal Sterile Barrier Technique was utilized including caps, mask, sterile gowns, sterile gloves, sterile drape, hand hygiene and skin antiseptic. A timeout was performed prior to the initiation of the procedure. Fluoroscopy was performed the biliary drainage catheters. The catheters were prepped with Betadine and draped. The right biliary drainage catheter was injected with contrast material. The catheter was then cut and removed over a guidewire. A 5 [place] Kumpe catheter was placed over the guidewire and advanced into the biliary tree. The catheter was further advanced down the common bile duct and into the duodenum over a hydrophilic guidewire. A new 12 French internal/external biliary drainage catheter was then advanced. The distal portion of the catheter was formed in the duodenum. The catheter was injected with contrast and a fluoroscopic spot image saved. The catheter was flushed and connected to a gravity drainage bag. The left sided biliary drainage catheter was then injected with contrast. It was cut and removed over a guidewire. Utilizing a 5 French catheter, guidewire access was then obtained down the common bile duct and into the  duodenum. Over the guidewire, a new 10 [place] internal/external biliary drainage catheter was advanced. The distal portion of the catheter was formed in the duodenum. The catheter was injected with contrast and a fluoroscopic image saved. The catheter was flushed and connected to a gravity drainage bag. Bilateral biliary tube output was then assessed and decision made to cap both drains. Pressure dressings were applied at the catheter exit sites after applying StatLock retention devices. FINDINGS: Initial fluoroscopy shows significant retraction of both internal/external biliary drainage catheters which are both retracted significantly into the liver  parenchyma. Contrast injection of the right biliary drainage catheter confirms intraparenchymal positioning within a peripheral bile duct in the liver parenchyma. After removing the right biliary drainage catheter, guidewire access was re-established down the common bile duct and into the duodenum successfully allowing placement of a new internal/external biliary drainage catheter that was formed in the duodenum. There was return minimally blood tinged bile after successful replacement of the right-sided biliary drainage catheter. The left biliary drainage catheter was also in an intraparenchymal position within the left lobe near the confluence with the right lobe. After removing the drain over a guidewire, there was fairly brisk bleeding noted from the catheter exit site at the skin. Etiology of bleeding was not visible with contrast injection of the drain. After advancing access and placing a new left-sided biliary drain to the level of the duodenum, there was note made bloody output from both the right and left biliary drains. For this reason, a decision was made to cap the biliary drains for now to limit blood loss and try to tamponade bleeding. Based on the CT of 06/19/2018, there is visible tumor invading the left portal venous system and main portal vein at the level of the porta hepatis with thrombus in the portal vein. Bleeding today is felt to most likely be originating from tumor eroding into the portal vein and central left biliary tree. Arterial bleeding in this location cannot be entirely excluded. However, a discrete arterial pseudoaneurysm is not visible on the recent CT study. IMPRESSION: Both internal/external biliary drainage catheters have retracted into the liver parenchyma. Both catheters were able to be exchanged and advanced centrally through the common bile duct and formed in the duodenum. During catheter exchange, there was brisk bleeding noted from the left lobe biliary drain catheter  exit site. After the left lobe drain was exchange, blood return was also noted from the right biliary drainage catheter. Presume source of bleeding is tumor erosion into the central liver and porta hepatis with tumor likely invading the portal vein by CT. Given hemobilia from the biliary drains, both drains were capped to try to tamponade bleeding. Electronically Signed   By: Aletta Edouard M.D.   On: 06/25/2018 16:36   [place] Ekg Site Rite  Result Date: 06/22/2018 If Site Rite image not attached, placement could not be confirmed due to current cardiac rhythm.   Labs:  CBC: Recent Labs    06/22/18 0514 06/22/18 2131 06/25/18 0447 06/26/18 0415  WBC 10.8* 14.0* 8.8 13.7*  HGB 7.0* 8.2* 8.5* 7.1*  HCT 22.7* 25.6* 27.0* 22.5*  PLT 139* 171 175 129*    COAGS: Recent Labs    05/12/18 2113 05/13/18 2357  06/19/18 1145 06/20/18 0347 06/23/18 0310 06/24/18 0926  INR 1.7* 1.8*   < > 1.7* 1.6* 1.5* 1.4*  APTT 36 37*  --  44*  --   --   --    < > = values in this  interval not displayed.    BMP: Recent Labs    06/23/18 0310 06/24/18 0507 06/25/18 0447 06/26/18 0415  NA 135 135 137 134*  K 4.3 4.4 3.6 4.7  CL 107 107 108 104  CO2 21* 22 22 22   GLUCOSE 127* 149* 109* 112*  BUN 16 20 18 22   CALCIUM 8.1* 8.2* 8.3* 8.0*  CREATININE 0.43* 0.47* 0.43* 0.62  GFRNONAA >60 >60 >60 >60  GFRAA >60 >60 >60 >60    LIVER FUNCTION TESTS: Recent Labs    06/20/18 0347 06/23/18 0310 06/24/18 0507 06/25/18 0447  BILITOT 5.9* 8.4* 5.4* 4.2*  AST 49* 48* 43* 43*  ALT 23 25 27 29   ALKPHOS 224* 255* 245* 279*  PROT 6.5 5.9* 5.7* 6.0*  ALBUMIN 2.8* 2.4* 2.2* 2.4*    Assessment and Plan: Pt with hx stage IV cholangiocarcinoma with obst jaundice, PVT, cirrhosis, recurrent ascites, anemia, prior placement of rt/left I/E biliary drains, s/p exchanges yesterday with brisk bleeding from drains likely from tumor erosion into central liver/porta hepatis with tumor likely invading portal vein;  both drains left capped to tamponade bleeding; WBC 13.7, hgb today 7.1(8.5), plts 129k, creat nl; case d/w Dr. Kathlene Cote- keep drains capped; DO NOT FLUSH DRAINS; 2 gravity bags given to pt in case abd pain becomes severe and attempt at bag drainage is needed- would consult with IR MD before making any changes in plan with drains; home hospice planned; pt has an appt for next drain exchange on 08/20/18 if necessary   Electronically Signed: D. Rowe Robert, PA-C 06/26/2018, 11:46 AM   I spent a total of 20 minutes at the the patient's bedside AND on the patient's hospital floor or unit, greater than 50% of which was counseling/coordinating care for biliary drains    Patient ID: Matthew Lewis, male   DOB: January 21, 1955, 63 y.o.   MRN: [id number]

## 2018-06-26 NOTE — Progress Notes (Signed)
Manufacturing engineer [facility])  Hospice  Spoke with wife Rollene Fare, confirmed interest in hospice support at home.  RN Case Manager confirms that pt will need a hospital bed, wheel chair and 3n1.  [facility] will order this through [place].  Pt will need [facility] transportation home.  Please send DNR form home with pt.  Please send prescriptions for comfort home with the pt, as hospice will not begin until tomorrow.  Thank you, Venia Carbon RN, BSN, [facility] Liaison  [phone]

## 2018-06-26 NOTE — Progress Notes (Signed)
[facility] arrived to transport Patient home.  Wife had called and informed staff that hospital bed had arrived and they are ready for him to come home.  Patient received his HS meds with requested pain med and zofran.  Report and care of Patient given to [facility] at this time.  Discharge instructions reviewed with patient.  Prescriptions sent home.  [facility] will obtain additional DNR form from Hospice.

## 2018-06-26 NOTE — TOC Progression Note (Signed)
Transition of Care [facility]) - Progression Note    Patient Details  Name: Matthew Lewis MRN: [id number] Date of Birth: Apr 10, 1955  Transition of Care [facility]) CM/SW Contact  Joaquin Courts, RN Phone Number: 06/26/2018, 1:38 PM  Clinical Narrative:  Hospice to deliver hospital bed to home at which point transport can be called for patient to d/c home.  Transport packet left at nurses desk and bedside RN made aware. Bedside RN to call [facility] after confirmation of bed delivery.           Expected Discharge Plan and Services           Expected Discharge Date: 06/26/18                                     Social Determinants of Health (SDOH) Interventions    Readmission Risk Interventions Readmission Risk Prevention Plan 06/22/2018 05/17/2018  Transportation Screening Complete Complete  Medication Review (RN Care Manager) Complete Complete  PCP or Specialist appointment within 3-5 days of discharge Not Complete Not Complete  PCP/Specialist Appt Not Complete comments not ready for d/c patient states he will call to arrange appointment on Monday  HRI or [place] Complete Patient refused  SW Recovery Care/Counseling Consult Complete Complete  Palliative Care Screening Complete Not [place] Not Applicable Not Applicable  Some recent data might be hidden

## 2018-06-26 NOTE — Discharge Summary (Signed)
Physician Discharge Summary  HAYS DUNNIGAN UUV:[id number] DOB: 05-07-1955 DOA: 06/19/2018  PCP: [name], MD  Admit date: 06/19/2018 Discharge date: 06/26/2018  Admitted From: Home  Disposition:  Home with hospice  Recommendations for Outpatient Follow-up and new medication changes:  1. Follow up with hospice team. 2. Continue pain control with morphine 3. Insulin dose has been decreased to 10 units levimir daily to prevent hypoglycemia.  4. Continue insulin sliding scale.  5. Patient has been placed on midodrine for hypotension. 6.  Do not flush biliary drains, 2 gravity bags given in case of severe abdominal pain and attempt drainage is needed. 7. Follow up 08/20/18 with IR for next drain exchange if necessary.    Home Health: na   Equipment/Devices: hospital bed   Discharge Condition: stable CODE STATUS: dnr   Diet recommendation: heart healthy and diabetic prudent as tolerated.   Brief/Interim Summary: 63 year old male who presented after a mechanical fall.  He does have the significant past medical history for cholangiocarcinoma stage IV, liver cirrhosis, history of GI bleed, obstructive jaundice status post drain placement, and type 2 diabetes mellitus.  The patient was not doing well for last 24 hours prior to hospitalization, decreased p.o. intake and increased abdominal discomfort, on the day of admission he fell while trying to ambulate, positive facial trauma but no loss of consciousness.  On his initial physical examination his blood pressure was 88/55, pulse rate 114, respiratory rate 25, temperature 39.3 C, his lungs were clear to auscultation bilaterally, heart S1-S2 present and rhythmic, the abdomen was distended, biliary drain in place, no lower extremity edema.  Sodium 127, potassium 4.0, chloride 98, bicarb 18, glucose 161, BUN 12, creatinine 1.77, AST 51, ALT 22, white count 15.7, hemoglobin 8.0, hematocrit 25.4, platelets 228.  INR 1.5, urinalysis  negative for infection.  SARS COVID-19 negative.  CT head, cervical spine and maxillofacial no acute fractures.  CT of the abdomen with multiple hepatic metastatic lesions.  Hepatic cirrhosis.  Biliary drains in place x2.  Chest radiograph with hypoinflated lung fields, no infiltrates, right internal jugular vein port.  EKG 122 bpm, normal axis, sinus rhythm, normal QRS, prolonged QT C5 21, no ST segment or T wave changes.  Patient was admitted to the hospital working diagnosis of suspected sepsis unclear origin.   Patient developed septic shock, required vasopressors and invasive mechanical ventilation.  He was intubated on 06/12 and liberated from mechanical ventilation June 13, and on June 17 he was weaned off vasopressors.   He has been placed on midodrine for hypotension.  He does have a very poor prognosis, patient was evaluated by palliative care team and he will be transitioned to hospice services at home.  1.  Septic shock (present on admission) complicated with acute hypoxic respiratory failure.  No clear source was identified but patient was treated with broad-spectrum antibiotic therapy along with vasopressors and IV fluids.  He did required stress dose steroids with hydrocortisone, along with fludrocortisone and midodrine to regain hemodynamic stability.  Ascitic fluid was tested and came back negative for infection with 166 white cells, fluid culture and Gram stain was negative.  He completed antibiotic therapy in the hospital.  Patient required invasive mechanical ventilation for 24 hours with good toleration.  2.  Stage IV cholangiocarcinoma.  Very poor prognosis, his ability drains were exchanged, obtaining frank bloody fluid.  We will keep drains capped for now, instructions not to flush, drain only if severe abdominal pain.  For now patient will continue  with morphine.  3.  Liver cirrhosis with ascites.  He had decompensated disease on admission, required paracentesis x2, on  admission 1 L fluid was removed, June 17 repeat paracentesis, large volume, 6 L removed, patient did received IV albumin peri-procedure.   4.  Acute blood loss anemia, suspected lower GI bleed.  After initial volume resuscitation his hemoglobin dropped to 5.9, hematocrit 19.1, patient underwent diagnostic endoscopy, finding diffuse mucosal changes along the entire length of the esophagus status post banding with complete obliteration of the varices.  Patient did receive 1 unit of packed red blood cells.  His discharge hemoglobin is 7.1.   5.  Type 2 diabetes mellitus.  Patient is on high doses of insulin at home, in the hospital his insulin dose was reduced to avoid hypoglycemia, will continue insulin Levemir 10 units daily along with insulin sliding scale.  His fasting discharge glucose is 112.   Discharge Diagnoses:  Principal Problem:   SIRS (systemic inflammatory response syndrome) (HCC) Active Problems:   Type II diabetes mellitus (HCC)   CAD (coronary artery disease)- PCI 2001- no evaluation since   Metastatic adenocarcinoma to liver (HCC)   Cholangiocarcinoma (HCC)   Malignant ascites    Discharge Instructions   Allergies as of 06/26/2018      Reactions   Morphine And Related Other (See Comments)   Patient is recovering from drug addiction and wants to avoid any narcotics      Medication List    STOP taking these medications   celecoxib 200 MG capsule Commonly known as: CELEBREX   diphenoxylate-atropine 2.5-0.025 MG tablet Commonly known as: LOMOTIL   HYDROcodone-acetaminophen 5-325 MG tablet Commonly known as: Norco   nadolol 20 MG tablet Commonly known as: Corgard   sodium chloride 0.9 % injection   sodium chloride flush 0.9 % Soln Commonly known as: NS     TAKE these medications   acetaminophen 325 MG tablet Commonly known as: TYLENOL Take 650 mg by mouth every 6 (six) hours as needed for mild pain.   calcium carbonate 500 MG chewable tablet Commonly  known as: TUMS - dosed in mg elemental calcium Chew 2 tablets by mouth daily as needed for indigestion or heartburn.   DULoxetine 30 MG capsule Commonly known as: CYMBALTA Take 30 mg by mouth 2 (two) times daily.   fexofenadine-pseudoephedrine 60-120 MG 12 hr tablet Commonly known as: ALLEGRA-D Take 1 tablet by mouth daily.   gabapentin 400 MG capsule Commonly known as: NEURONTIN Take 400-800 mg by mouth See admin instructions. Take 400 mg by mouth in the morning and 800 mg in the evening   hydroxypropyl methylcellulose / hypromellose 2.5 % ophthalmic solution Commonly known as: ISOPTO TEARS / GONIOVISC 1 drop 3 (three) times daily as needed for dry eyes.   insulin aspart 100 UNIT/ML injection Commonly known as: novoLOG Inject 0-9 Units into the skin 3 (three) times daily with meals. For glucose 150 to 200 use two units, for 201 to 250 use three units, for 251 to 300 use five units, for 301 to 350 use seven units, for 351 or greater use nine units.   insulin detemir 100 UNIT/ML injection Commonly known as: Levemir Inject 0.1 mLs (10 Units total) into the skin at bedtime. What changed:   how much to take  when to take this  additional instructions   levocetirizine 5 MG tablet Commonly known as: XYZAL Take 5 mg by mouth every evening.   lidocaine 5 % Commonly known as: [place]  1 patch onto the skin daily. Remove & Discard patch within 12 hours or as directed by MD What changed:   when to take this  reasons to take this  additional instructions   lidocaine-prilocaine cream Commonly known as: EMLA Apply to port site 1 hour prior to access What changed:   how much to take  how to take this  when to take this   midodrine 10 MG tablet Commonly known as: PROAMATINE Take 1 tablet (10 mg total) by mouth 3 (three) times daily with meals for 30 days.   morphine 15 MG tablet Commonly known as: MSIR Take 0.5 tablets (7.5 mg total) by mouth every 4 (four)  hours as needed for severe pain. What changed: when to take this   multivitamin tablet Take 1 tablet by mouth daily.   ondansetron 8 MG tablet Commonly known as: ZOFRAN Take 8 mg by mouth 2 (two) times daily as needed for nausea or vomiting.   pantoprazole 40 MG tablet Commonly known as: PROTONIX Take 1 tablet (40 mg total) by mouth daily at 6 (six) AM.   tamsulosin 0.4 MG Caps capsule Commonly known as: FLOMAX TAKE 1 CAPSULE(0.4 MG) BY MOUTH DAILY AFTER SUPPER What changed:   how much to take  how to take this  when to take this  additional instructions       Allergies  Allergen Reactions  . Morphine And Related Other (See Comments)    Patient is recovering from drug addiction and wants to avoid any narcotics    Consultations:  Palliative Care  IR  GI    Procedures/Studies: Ct Head Wo Contrast  Result Date: 06/19/2018 CLINICAL DATA:  Head trauma, minor. Fall. Cholangiocarcinoma with known metastatic disease. Initial encounter. EXAM: CT HEAD WITHOUT CONTRAST CT MAXILLOFACIAL WITHOUT CONTRAST CT CERVICAL SPINE WITHOUT CONTRAST TECHNIQUE: Multidetector CT imaging of the head, cervical spine, and maxillofacial structures were performed using the standard protocol without intravenous contrast. Multiplanar CT image reconstructions of the cervical spine and maxillofacial structures were also generated. COMPARISON:  CT of the head and cervical spine 05/12/2018. MRI brain 02/26/2018 FINDINGS: CT HEAD FINDINGS Brain: No acute infarct, hemorrhage, or mass lesion is present. No significant white matter lesions are present. The ventricles are of normal size. No significant extraaxial fluid collection is present. The brainstem and cerebellum are within normal limits. Vascular: Atherosclerotic calcifications are present in the cavernous right internal carotid artery. There is no hyperdense vessel. Skull: Calvarium is intact. No focal lytic or blastic lesions are present. CT  MAXILLOFACIAL FINDINGS Osseous: No acute or healing facial fractures are present. Nasal bones are intact. Zygomatic arch is normal bilaterally. Orbits: The globes and orbits are within normal limits. Sinuses: The paranasal sinuses and mastoid air cells are clear. Soft tissues: Left infraorbital soft tissue swelling and hematoma is present. There is no significant laceration. No foreign body is present. No other significant soft tissue injury is present. CT CERVICAL SPINE FINDINGS Alignment: There straightening of the normal cervical lordosis. Slight kyphosis is present at C4-5. AP alignment is anatomic. Skull base and vertebrae: The craniocervical junction is normal. Vertebral body heights are maintained. No acute or healing fractures are present. Soft tissues and spinal canal: No prevertebral fluid or swelling. No visible canal hematoma. Vascular calcifications are present at the carotid bifurcations, right greater than left. Disc levels: Solid fusion is present C5-6, C6-7, and C7-T1. Adjacent level disease is present at C4-5 with foraminal narrowing bilaterally due to uncovertebral disease. Right foraminal narrowing is  present at C3-4 greater than C2-3 due to uncovertebral and facet disease. Upper chest: The lung apices are clear. Right IJ Port-A-Cath is in place. Thoracic inlet is otherwise normal. IMPRESSION: 1. Left infraorbital facial soft tissue swelling without underlying fracture. 2. Normal CT appearance of the brain for age. No acute intracranial abnormality or trauma to the head. 3. No acute or healing facial fractures. 4. No acute trauma to the cervical spine. 5. Cervical spine fusion at C5-6, C6-7, and C7-T1. 6. Adjacent level disease at C4-5 with bilateral foraminal stenosis. Electronically Signed   By: San Morelle M.D.   On: 06/19/2018 06:15   Ct Cervical Spine Wo Contrast  Result Date: 06/19/2018 CLINICAL DATA:  Head trauma, minor. Fall. Cholangiocarcinoma with known metastatic disease.  Initial encounter. EXAM: CT HEAD WITHOUT CONTRAST CT MAXILLOFACIAL WITHOUT CONTRAST CT CERVICAL SPINE WITHOUT CONTRAST TECHNIQUE: Multidetector CT imaging of the head, cervical spine, and maxillofacial structures were performed using the standard protocol without intravenous contrast. Multiplanar CT image reconstructions of the cervical spine and maxillofacial structures were also generated. COMPARISON:  CT of the head and cervical spine 05/12/2018. MRI brain 02/26/2018 FINDINGS: CT HEAD FINDINGS Brain: No acute infarct, hemorrhage, or mass lesion is present. No significant white matter lesions are present. The ventricles are of normal size. No significant extraaxial fluid collection is present. The brainstem and cerebellum are within normal limits. Vascular: Atherosclerotic calcifications are present in the cavernous right internal carotid artery. There is no hyperdense vessel. Skull: Calvarium is intact. No focal lytic or blastic lesions are present. CT MAXILLOFACIAL FINDINGS Osseous: No acute or healing facial fractures are present. Nasal bones are intact. Zygomatic arch is normal bilaterally. Orbits: The globes and orbits are within normal limits. Sinuses: The paranasal sinuses and mastoid air cells are clear. Soft tissues: Left infraorbital soft tissue swelling and hematoma is present. There is no significant laceration. No foreign body is present. No other significant soft tissue injury is present. CT CERVICAL SPINE FINDINGS Alignment: There straightening of the normal cervical lordosis. Slight kyphosis is present at C4-5. AP alignment is anatomic. Skull base and vertebrae: The craniocervical junction is normal. Vertebral body heights are maintained. No acute or healing fractures are present. Soft tissues and spinal canal: No prevertebral fluid or swelling. No visible canal hematoma. Vascular calcifications are present at the carotid bifurcations, right greater than left. Disc levels: Solid fusion is present  C5-6, C6-7, and C7-T1. Adjacent level disease is present at C4-5 with foraminal narrowing bilaterally due to uncovertebral disease. Right foraminal narrowing is present at C3-4 greater than C2-3 due to uncovertebral and facet disease. Upper chest: The lung apices are clear. Right IJ Port-A-Cath is in place. Thoracic inlet is otherwise normal. IMPRESSION: 1. Left infraorbital facial soft tissue swelling without underlying fracture. 2. Normal CT appearance of the brain for age. No acute intracranial abnormality or trauma to the head. 3. No acute or healing facial fractures. 4. No acute trauma to the cervical spine. 5. Cervical spine fusion at C5-6, C6-7, and C7-T1. 6. Adjacent level disease at C4-5 with bilateral foraminal stenosis. Electronically Signed   By: San Morelle M.D.   On: 06/19/2018 06:15   Ct Abdomen Pelvis W Contrast  Result Date: 06/19/2018 CLINICAL DATA:  Abdominal pain. History of cholangiocarcinoma. Possible sepsis. EXAM: CT ABDOMEN AND PELVIS WITH CONTRAST TECHNIQUE: Multidetector CT imaging of the abdomen and pelvis was performed using the standard protocol following bolus administration of intravenous contrast. CONTRAST:  146m OMNIPAQUE IOHEXOL 300 MG/ML  SOLN COMPARISON:  CT scan of February 19, 2018. FINDINGS: Lower chest: Right lower lobe pneumonia or atelectasis is noted. Stable rounded density is noted in left posterior costophrenic sulcus concerning for metastatic disease. Small left pleural effusion is noted. Hepatobiliary: Air and probable sludge is noted within gallbladder lumen. Hepatic cirrhosis is noted. Right hepatic pneumobilia is noted. Multiple low densities are noted throughout the liver which may represent metastatic disease. Stable position of 2 right external biliary drains are noted. Pancreas: Unremarkable. No pancreatic ductal dilatation or surrounding inflammatory changes. Spleen: Stable splenomegaly is noted. No focal abnormality is noted. Adrenals/Urinary  Tract: Stable bilateral adrenal gland enlargement is noted. Bilateral nonobstructive nephrolithiasis is noted. No hydronephrosis or renal obstruction is noted. Urinary bladder is unremarkable. Stomach/Bowel: The stomach appears normal. Stool is noted throughout the colon. There is no evidence of bowel obstruction. The appendix is not clearly visualized. Mild wall and fold thickening is seen involving the right and transverse colon which may represent infectious or inflammatory colitis. Vascular/Lymphatic: Aortic atherosclerosis is noted. Grossly stable 16 mm right periaortic lymph node is noted. Stable 1.6 cm left periaortic lymph node is noted. Reproductive: Prostate is unremarkable. Other: Mild ascites is noted. Musculoskeletal: No acute or significant osseous findings. IMPRESSION: Continued presence of probable multiple hepatic metastatic lesions. Hepatic cirrhosis is noted, with continued presence of 2 external biliary drainage catheters. Right hepatic pneumobilia is noted. Stable rounded density noted in left posterior costophrenic sulcus concerning for metastatic disease. Interval development of mild ascites. Wall thickening of transverse and right colon is noted which may represent infectious or inflammatory colitis, or potentially may be due to ascites. Stable splenomegaly. Stable bilateral adrenal gland enlargement is noted consistent with metastatic disease. Stable periaortic adenopathy is noted which may be neoplastic or inflammatory in etiology. Aortic Atherosclerosis (ICD10-I70.0). Electronically Signed   By: Marijo Conception M.D.   On: 06/19/2018 16:37   US Paracentesis  Result Date: 06/19/2018 INDICATION: Patient with history of intrahepatic cholangiocarcinoma, NASH cirrhosis, recurrent ascites, fever/sepsis, anemia; request made for diagnostic paracentesis up to 1 liter. EXAM: ULTRASOUND GUIDED DIAGNOSTIC PARACENTESIS MEDICATIONS: None COMPLICATIONS: None immediate. PROCEDURE: Informed written  consent was obtained from the patient after a discussion of the risks, benefits and alternatives to treatment. A timeout was performed prior to the initiation of the procedure. Initial ultrasound scanning demonstrates a large amount of ascites within the left lower abdominal quadrant. The left lower abdomen was prepped and draped in the usual sterile fashion. 1% lidocaine was used for local anesthesia. Following this, a 19 gauge, 10-cm, Yueh catheter was introduced. An ultrasound image was saved for documentation purposes. The paracentesis was performed. The catheter was removed and a dressing was applied. The patient tolerated the procedure well without immediate post procedural complication. FINDINGS: A total of approximately 1 liter of yellow fluid was removed. Samples were sent to the laboratory as requested by the clinical team. IMPRESSION: Successful ultrasound-guided diagnostic paracentesis yielding 1 liter of peritoneal fluid. Read by: Rowe Robert, PA-C Electronically Signed   By: Aletta Edouard M.D.   On: 06/19/2018 16:12   Portable Chest X-ray  Result Date: 06/19/2018 CLINICAL DATA:  ET tube placement EXAM: PORTABLE CHEST 1 VIEW COMPARISON:  06/19/2018 FINDINGS: Endotracheal tube is 4 cm above the carina. Right Port-A-Cath is unchanged with the tip in the right atrium. Heart is normal size. Low lung volumes. Increasing perihilar opacities and bibasilar opacities which could reflect edema and/or atelectasis. No visible significant effusions or acute bony abnormality. IMPRESSION: Endotracheal tube 4 cm above  the carina. Increasing perihilar and bibasilar opacities which could reflect edema or atelectasis. Low lung volumes. Electronically Signed   By: Rolm Baptise M.D.   On: 06/19/2018 20:17   Dg Chest Port 1 View  Result Date: 06/19/2018 CLINICAL DATA:  Fall. Sepsis. Cholangiocarcinoma with metastatic disease. EXAM: PORTABLE CHEST 1 VIEW COMPARISON:  One-view chest x-ray 05/12/2018 CT of the chest  02/19/2018 FINDINGS: Heart size is normal. Right IJ Port-A-Cath is stable. The tip is in the right atrium. Asymmetric interstitial pattern is again noted on the left. Left basilar metastasis is not well seen. Lung volumes are low. Mild pulmonary vascular congestion is present without frank edema. IMPRESSION: 1. Cardiomegaly with mild vascular congestion. 2. Low lung volumes. 3. Stable chronic interstitial changes, asymmetric on the left. Electronically Signed   By: San Morelle M.D.   On: 06/19/2018 04:27   Ir Exchange Biliary Drain  Result Date: 06/25/2018 INDICATION: History of cholangiocarcinoma with prior placement of both right lobe and left lobe internal/external percutaneous biliary drainage catheters. The right-sided catheter has visibly retracted further out of the exit site. EXAM: 1. EXCHANGE OF RIGHT INTERNAL/EXTERNAL BILIARY DRAINAGE CATHETER 2. EXCHANGE OF LEFT INTERNAL/EXTERNAL BILIARY DRAINAGE CATHETER MEDICATIONS: None ANESTHESIA/SEDATION: None FLUOROSCOPY TIME:  Fluoroscopy Time: 7 minutes and 12 seconds. 348 mGy. CONTRAST:  25 mL Omnipaque 563 COMPLICATIONS: None immediate. PROCEDURE: Informed written consent was obtained from the patient after a thorough discussion of the procedural risks, benefits and alternatives. All questions were addressed. Maximal Sterile Barrier Technique was utilized including caps, mask, sterile gowns, sterile gloves, sterile drape, hand hygiene and skin antiseptic. A timeout was performed prior to the initiation of the procedure. Fluoroscopy was performed the biliary drainage catheters. The catheters were prepped with Betadine and draped. The right biliary drainage catheter was injected with contrast material. The catheter was then cut and removed over a guidewire. A 5 [place] Kumpe catheter was placed over the guidewire and advanced into the biliary tree. The catheter was further advanced down the common bile duct and into the duodenum over a hydrophilic  guidewire. A new 12 French internal/external biliary drainage catheter was then advanced. The distal portion of the catheter was formed in the duodenum. The catheter was injected with contrast and a fluoroscopic spot image saved. The catheter was flushed and connected to a gravity drainage bag. The left sided biliary drainage catheter was then injected with contrast. It was cut and removed over a guidewire. Utilizing a 5 French catheter, guidewire access was then obtained down the common bile duct and into the duodenum. Over the guidewire, a new 10 [place] internal/external biliary drainage catheter was advanced. The distal portion of the catheter was formed in the duodenum. The catheter was injected with contrast and a fluoroscopic image saved. The catheter was flushed and connected to a gravity drainage bag. Bilateral biliary tube output was then assessed and decision made to cap both drains. Pressure dressings were applied at the catheter exit sites after applying StatLock retention devices. FINDINGS: Initial fluoroscopy shows significant retraction of both internal/external biliary drainage catheters which are both retracted significantly into the liver parenchyma. Contrast injection of the right biliary drainage catheter confirms intraparenchymal positioning within a peripheral bile duct in the liver parenchyma. After removing the right biliary drainage catheter, guidewire access was re-established down the common bile duct and into the duodenum successfully allowing placement of a new internal/external biliary drainage catheter that was formed in the duodenum. There was return minimally blood tinged bile after successful replacement of  the right-sided biliary drainage catheter. The left biliary drainage catheter was also in an intraparenchymal position within the left lobe near the confluence with the right lobe. After removing the drain over a guidewire, there was fairly brisk bleeding noted from the catheter  exit site at the skin. Etiology of bleeding was not visible with contrast injection of the drain. After advancing access and placing a new left-sided biliary drain to the level of the duodenum, there was note made bloody output from both the right and left biliary drains. For this reason, a decision was made to cap the biliary drains for now to limit blood loss and try to tamponade bleeding. Based on the CT of 06/19/2018, there is visible tumor invading the left portal venous system and main portal vein at the level of the porta hepatis with thrombus in the portal vein. Bleeding today is felt to most likely be originating from tumor eroding into the portal vein and central left biliary tree. Arterial bleeding in this location cannot be entirely excluded. However, a discrete arterial pseudoaneurysm is not visible on the recent CT study. IMPRESSION: Both internal/external biliary drainage catheters have retracted into the liver parenchyma. Both catheters were able to be exchanged and advanced centrally through the common bile duct and formed in the duodenum. During catheter exchange, there was brisk bleeding noted from the left lobe biliary drain catheter exit site. After the left lobe drain was exchange, blood return was also noted from the right biliary drainage catheter. Presume source of bleeding is tumor erosion into the central liver and porta hepatis with tumor likely invading the portal vein by CT. Given hemobilia from the biliary drains, both drains were capped to try to tamponade bleeding. Electronically Signed   By: Aletta Edouard M.D.   On: 06/25/2018 16:36   Ir Exchange Biliary Drain  Result Date: 06/25/2018 INDICATION: History of cholangiocarcinoma with prior placement of both right lobe and left lobe internal/external percutaneous biliary drainage catheters. The right-sided catheter has visibly retracted further out of the exit site. EXAM: 1. EXCHANGE OF RIGHT INTERNAL/EXTERNAL BILIARY DRAINAGE  CATHETER 2. EXCHANGE OF LEFT INTERNAL/EXTERNAL BILIARY DRAINAGE CATHETER MEDICATIONS: None ANESTHESIA/SEDATION: None FLUOROSCOPY TIME:  Fluoroscopy Time: 7 minutes and 12 seconds. 348 mGy. CONTRAST:  25 mL Omnipaque 366 COMPLICATIONS: None immediate. PROCEDURE: Informed written consent was obtained from the patient after a thorough discussion of the procedural risks, benefits and alternatives. All questions were addressed. Maximal Sterile Barrier Technique was utilized including caps, mask, sterile gowns, sterile gloves, sterile drape, hand hygiene and skin antiseptic. A timeout was performed prior to the initiation of the procedure. Fluoroscopy was performed the biliary drainage catheters. The catheters were prepped with Betadine and draped. The right biliary drainage catheter was injected with contrast material. The catheter was then cut and removed over a guidewire. A 5 [place] Kumpe catheter was placed over the guidewire and advanced into the biliary tree. The catheter was further advanced down the common bile duct and into the duodenum over a hydrophilic guidewire. A new 12 French internal/external biliary drainage catheter was then advanced. The distal portion of the catheter was formed in the duodenum. The catheter was injected with contrast and a fluoroscopic spot image saved. The catheter was flushed and connected to a gravity drainage bag. The left sided biliary drainage catheter was then injected with contrast. It was cut and removed over a guidewire. Utilizing a 5 French catheter, guidewire access was then obtained down the common bile duct and into the duodenum.  Over the guidewire, a new 10 [place] internal/external biliary drainage catheter was advanced. The distal portion of the catheter was formed in the duodenum. The catheter was injected with contrast and a fluoroscopic image saved. The catheter was flushed and connected to a gravity drainage bag. Bilateral biliary tube output was then assessed and  decision made to cap both drains. Pressure dressings were applied at the catheter exit sites after applying StatLock retention devices. FINDINGS: Initial fluoroscopy shows significant retraction of both internal/external biliary drainage catheters which are both retracted significantly into the liver parenchyma. Contrast injection of the right biliary drainage catheter confirms intraparenchymal positioning within a peripheral bile duct in the liver parenchyma. After removing the right biliary drainage catheter, guidewire access was re-established down the common bile duct and into the duodenum successfully allowing placement of a new internal/external biliary drainage catheter that was formed in the duodenum. There was return minimally blood tinged bile after successful replacement of the right-sided biliary drainage catheter. The left biliary drainage catheter was also in an intraparenchymal position within the left lobe near the confluence with the right lobe. After removing the drain over a guidewire, there was fairly brisk bleeding noted from the catheter exit site at the skin. Etiology of bleeding was not visible with contrast injection of the drain. After advancing access and placing a new left-sided biliary drain to the level of the duodenum, there was note made bloody output from both the right and left biliary drains. For this reason, a decision was made to cap the biliary drains for now to limit blood loss and try to tamponade bleeding. Based on the CT of 06/19/2018, there is visible tumor invading the left portal venous system and main portal vein at the level of the porta hepatis with thrombus in the portal vein. Bleeding today is felt to most likely be originating from tumor eroding into the portal vein and central left biliary tree. Arterial bleeding in this location cannot be entirely excluded. However, a discrete arterial pseudoaneurysm is not visible on the recent CT study. IMPRESSION: Both  internal/external biliary drainage catheters have retracted into the liver parenchyma. Both catheters were able to be exchanged and advanced centrally through the common bile duct and formed in the duodenum. During catheter exchange, there was brisk bleeding noted from the left lobe biliary drain catheter exit site. After the left lobe drain was exchange, blood return was also noted from the right biliary drainage catheter. Presume source of bleeding is tumor erosion into the central liver and porta hepatis with tumor likely invading the portal vein by CT. Given hemobilia from the biliary drains, both drains were capped to try to tamponade bleeding. Electronically Signed   By: Aletta Edouard M.D.   On: 06/25/2018 16:36   [place] Ekg Site Rite  Result Date: 06/22/2018 If Site Rite image not attached, placement could not be confirmed due to current cardiac rhythm.  Ct Maxillofacial Wo Contrast  Result Date: 06/19/2018 CLINICAL DATA:  Head trauma, minor. Fall. Cholangiocarcinoma with known metastatic disease. Initial encounter. EXAM: CT HEAD WITHOUT CONTRAST CT MAXILLOFACIAL WITHOUT CONTRAST CT CERVICAL SPINE WITHOUT CONTRAST TECHNIQUE: Multidetector CT imaging of the head, cervical spine, and maxillofacial structures were performed using the standard protocol without intravenous contrast. Multiplanar CT image reconstructions of the cervical spine and maxillofacial structures were also generated. COMPARISON:  CT of the head and cervical spine 05/12/2018. MRI brain 02/26/2018 FINDINGS: CT HEAD FINDINGS Brain: No acute infarct, hemorrhage, or mass lesion is present. No significant white  matter lesions are present. The ventricles are of normal size. No significant extraaxial fluid collection is present. The brainstem and cerebellum are within normal limits. Vascular: Atherosclerotic calcifications are present in the cavernous right internal carotid artery. There is no hyperdense vessel. Skull: Calvarium is intact. No  focal lytic or blastic lesions are present. CT MAXILLOFACIAL FINDINGS Osseous: No acute or healing facial fractures are present. Nasal bones are intact. Zygomatic arch is normal bilaterally. Orbits: The globes and orbits are within normal limits. Sinuses: The paranasal sinuses and mastoid air cells are clear. Soft tissues: Left infraorbital soft tissue swelling and hematoma is present. There is no significant laceration. No foreign body is present. No other significant soft tissue injury is present. CT CERVICAL SPINE FINDINGS Alignment: There straightening of the normal cervical lordosis. Slight kyphosis is present at C4-5. AP alignment is anatomic. Skull base and vertebrae: The craniocervical junction is normal. Vertebral body heights are maintained. No acute or healing fractures are present. Soft tissues and spinal canal: No prevertebral fluid or swelling. No visible canal hematoma. Vascular calcifications are present at the carotid bifurcations, right greater than left. Disc levels: Solid fusion is present C5-6, C6-7, and C7-T1. Adjacent level disease is present at C4-5 with foraminal narrowing bilaterally due to uncovertebral disease. Right foraminal narrowing is present at C3-4 greater than C2-3 due to uncovertebral and facet disease. Upper chest: The lung apices are clear. Right IJ Port-A-Cath is in place. Thoracic inlet is otherwise normal. IMPRESSION: 1. Left infraorbital facial soft tissue swelling without underlying fracture. 2. Normal CT appearance of the brain for age. No acute intracranial abnormality or trauma to the head. 3. No acute or healing facial fractures. 4. No acute trauma to the cervical spine. 5. Cervical spine fusion at C5-6, C6-7, and C7-T1. 6. Adjacent level disease at C4-5 with bilateral foraminal stenosis. Electronically Signed   By: San Morelle M.D.   On: 06/19/2018 06:15   Ir Paracentesis  Result Date: 06/15/2018 INDICATION: Patient with history of intrahepatic  cholangiocarcinoma, NASH cirrhosis, recurrent ascites. Request made for therapeutic paracentesis. EXAM: ULTRASOUND GUIDED THERAPEUTIC PARACENTESIS MEDICATIONS: None COMPLICATIONS: None immediate. PROCEDURE: Informed written consent was obtained from the patient after a discussion of the risks, benefits and alternatives to treatment. A timeout was performed prior to the initiation of the procedure. Initial ultrasound scanning demonstrates a moderate-to-large amount of ascites within the right lower abdominal quadrant. The right lower abdomen was prepped and draped in the usual sterile fashion. 1% lidocaine was used for local anesthesia. Following this, a 19 gauge, 10-cm, Yueh catheter was introduced. An ultrasound image was saved for documentation purposes. The paracentesis was performed. The catheter was removed and a dressing was applied. The patient tolerated the procedure well without immediate post procedural complication. FINDINGS: A total of approximately 4.7 liters of yellow fluid was removed. IMPRESSION: Successful ultrasound-guided therapeutic paracentesis yielding 4.7 liters of peritoneal fluid. Read by: Rowe Robert, PA-C Electronically Signed   By: Aletta Edouard M.D.   On: 06/15/2018 13:56   Ir Paracentesis  Result Date: 06/08/2018 INDICATION: Patient with history of intrahepatic cholangiocarcinoma, NASH cirrhosis, recurrent ascites. Request made for therapeutic paracentesis. EXAM: ULTRASOUND GUIDED THERAPEUTIC PARACENTESIS MEDICATIONS: None COMPLICATIONS: None immediate. PROCEDURE: Informed written consent was obtained from the patient after a discussion of the risks, benefits and alternatives to treatment. A timeout was performed prior to the initiation of the procedure. Initial ultrasound scanning demonstrates a moderate to large amount of ascites within the right lower abdominal quadrant. The right lower abdomen  was prepped and draped in the usual sterile fashion. 1% lidocaine was used for local  anesthesia. Following this, a 19 gauge, 10-cm, Yueh catheter was introduced. An ultrasound image was saved for documentation purposes. The paracentesis was performed. The catheter was removed and a dressing was applied. The patient tolerated the procedure well without immediate post procedural complication. FINDINGS: A total of approximately 4.8 liters of yellow fluid was removed. IMPRESSION: Successful ultrasound-guided paracentesis yielding 4.8 liters of peritoneal fluid. Read by: Rowe Robert, PA-C Electronically Signed   By: Sandi Mariscal M.D.   On: 06/08/2018 10:40   Ir Paracentesis  Result Date: 06/02/2018 INDICATION: Patient with history of intrahepatic cholangiocarcinoma, NASH cirrhosis, recurrent ascites. Request made for therapeutic paracentesis. EXAM: ULTRASOUND GUIDED THERAPEUTIC PARACENTESIS MEDICATIONS: None COMPLICATIONS: None immediate. PROCEDURE: Informed written consent was obtained from the patient after a discussion of the risks, benefits and alternatives to treatment. A timeout was performed prior to the initiation of the procedure. Initial ultrasound scanning demonstrates a large amount of ascites within the right lower abdominal quadrant. The right lower abdomen was prepped and draped in the usual sterile fashion. 1% lidocaine was used for local anesthesia. Following this, a 19 gauge, 10-cm, Yueh catheter was introduced. An ultrasound image was saved for documentation purposes. The paracentesis was performed. The catheter was removed and a dressing was applied. The patient tolerated the procedure well without immediate post procedural complication. FINDINGS: A total of approximately 5.3 liters of yellow fluid was removed. IMPRESSION: Successful ultrasound-guided therapeutic paracentesis yielding 5.3 liters of peritoneal fluid. Read by: Rowe Robert, PA-C Electronically Signed   By: Sandi Mariscal M.D.   On: 06/02/2018 15:45      Procedures: paracentesis x1,   Subjective: Patient is  feeling stable, his pain is controlled, no nausea no vomiting.  Discharge Exam: Vitals:   06/25/18 2228 06/26/18 0444  BP: 99/71 112/73  Pulse: (!) 112 100  Resp: 17 17  Temp: 98.6 F (37 C) 97.8 F (36.6 C)  SpO2: 100% 100%   Vitals:   06/25/18 1418 06/25/18 2145 06/25/18 2228 06/26/18 0444  BP: 120/66 98/72 99/71  112/73  Pulse: 85 (!) 116 (!) 112 100  Resp: 18 17 17 17   Temp: 98 F (36.7 C) 98.7 F (37.1 C) 98.6 F (37 C) 97.8 F (36.6 C)  TempSrc: Oral Oral Oral Oral  SpO2: 99% 98% 100% 100%  Weight:      Height:        General: deconditioned and ill looking appearing  Neurology: Awake and alert, non focal  E ENT: positive pallor, positive icterus, oral mucosa moist Cardiovascular: No JVD. S1-S2 present, rhythmic, no gallops, rubs, or murmurs. Positive lower extremity edema. Pulmonary:  Positive breath sounds bilaterally, adequate air movement, no wheezing, rhonchi or rales. Gastrointestinal. Abdomen distended with no organomegaly, non tender, no rebound or guarding, drains in place, non tender to superficial palpation.  Skin. No rashes Musculoskeletal: no joint deformities   The results of significant diagnostics from this hospitalization (including imaging, microbiology, ancillary and laboratory) are listed below for reference.     Microbiology: Recent Results (from the past 240 hour(s))  Gram stain     Status: None   Collection Time: 06/19/18  2:56 AM   Specimen: Fluid  Result Value Ref Range Status   Specimen Description FLUID  Final   Special Requests NONE  Final   Gram Stain   Final    WBC PRESENT, PREDOMINANTLY MONONUCLEAR NO ORGANISMS SEEN CYTOSPIN SMEAR Performed at [facility], [place] [address], [place], [place] [address]    Report Status 06/22/2018 FINAL  Final  Culture, body fluid-bottle     Status: None   Collection Time: 06/19/18  2:56 AM   Specimen: Fluid  Result Value Ref Range Status   Specimen Description FLUID  Final   Special  Requests NONE  Final   Culture   Final    NO GROWTH 5 DAYS Performed at [facility] [address] [place], [place] [address]    Report Status 06/24/2018 FINAL  Final  Culture, blood (Routine x 2)     Status: None   Collection Time: 06/19/18  3:30 AM   Specimen: BLOOD  Result Value Ref Range Status   Specimen Description   Final    BLOOD BLOOD LEFT HAND Performed at [facility] [address], [place], [place] [address]    Special Requests   Final    BOTTLES DRAWN AEROBIC ONLY Blood Culture adequate volume Performed at [facility] [address], [place], [place] [address]    Culture   Final    NO GROWTH 5 DAYS Performed at [facility], [place] [address] [place], [place] [address]    Report Status 06/24/2018 FINAL  Final  SARS Coronavirus 2 (CEPHEID- Performed in [facility] lab), Hosp Order     Status: None   Collection Time: 06/19/18  3:30 AM   Specimen: Nasopharyngeal Swab  Result Value Ref Range Status   SARS Coronavirus 2 NEGATIVE NEGATIVE Final    Comment: (NOTE) If result is NEGATIVE SARS-CoV-2 target nucleic acids are NOT DETECTED. The SARS-CoV-2 RNA is generally detectable in upper and lower  respiratory specimens during the acute phase of infection. The lowest  concentration of SARS-CoV-2 viral copies this assay can detect is 250  copies / mL. A negative result does not preclude SARS-CoV-2 infection  and should not be used as the sole basis for treatment or other  patient management decisions.  A negative result may occur with  improper specimen collection / handling, submission of specimen other  than nasopharyngeal swab, presence of viral mutation(s) within the  areas targeted by this assay, and inadequate number of viral copies  (<250 copies / mL). A negative result must be combined with clinical  observations, patient history, and epidemiological information. If result is POSITIVE SARS-CoV-2  target nucleic acids are DETECTED. The SARS-CoV-2 RNA is generally detectable in upper and lower  respiratory specimens dur ing the acute phase of infection.  Positive  results are indicative of active infection with SARS-CoV-2.  Clinical  correlation with patient history and other diagnostic information is  necessary to determine patient infection status.  Positive results do  not rule out bacterial infection or co-infection with other viruses. If result is PRESUMPTIVE POSTIVE SARS-CoV-2 nucleic acids MAY BE PRESENT.   A presumptive positive result was obtained on the submitted specimen  and confirmed on repeat testing.  While 2019 novel coronavirus  (SARS-CoV-2) nucleic acids may be present in the submitted sample  additional confirmatory testing may be necessary for epidemiological  and / or clinical management purposes  to differentiate between  SARS-CoV-2 and other Sarbecovirus currently known to infect humans.  If clinically indicated additional testing with an alternate test  methodology [phone] is advised. The SARS-CoV-2 RNA is generally  detectable in upper and lower respiratory sp ecimens during the acute  phase of infection. The expected result is Negative. Fact Sheet for  Patients:  StrictlyIdeas.no Fact Sheet for Healthcare Providers: BankingDealers.co.za This test is not yet approved or cleared by the [place] FDA and has been authorized for detection and/or diagnosis of SARS-CoV-2 by FDA under an Emergency Use Authorization (EUA).  This EUA will remain in effect (meaning this test can be used) for the duration of the COVID-19 declaration under Section 564(b)(1) of the Act, 21 U.S.C. section 360bbb-3(b)(1), unless the authorization is terminated or revoked sooner. Performed at [facility], [place] [address] [place], [place] [address]   Culture, blood (Routine x 2)     Status: None   Collection  Time: 06/19/18  3:38 AM   Specimen: BLOOD  Result Value Ref Range Status   Specimen Description   Final    BLOOD BLOOD LEFT ARM Performed at [facility] [address], [place], [place] [address]    Special Requests   Final    BOTTLES DRAWN AEROBIC AND ANAEROBIC Blood Culture adequate volume Performed at [facility] [address] [place], [place] [address]    Culture   Final    NO GROWTH 5 DAYS Performed at [facility], [place] [address], [place], [place] [address]    Report Status 06/24/2018 FINAL  Final  Urine culture     Status: None   Collection Time: 06/19/18  4:40 AM   Specimen: Urine, Catheterized  Result Value Ref Range Status   Specimen Description   Final    URINE, CATHETERIZED Performed at [facility] [address] [place], [place] [address]    Special Requests   Final    Immunocompromised Performed at [facility], [place] [address] [place], [place] [address]    Culture   Final    NO GROWTH Performed at [facility], [place] [address] [place], [place] [address]    Report Status 06/20/2018 FINAL  Final  MRSA PCR Screening     Status: None   Collection Time: 06/19/18  7:00 AM   Specimen: Nasopharyngeal  Result Value Ref Range Status   MRSA by PCR NEGATIVE NEGATIVE Final    Comment:        The GeneXpert MRSA Assay (FDA approved for NASAL specimens only), is one component of a comprehensive MRSA colonization surveillance program. It is not intended to diagnose MRSA infection nor to guide or monitor treatment for MRSA infections. Performed at [facility], [place] [address], [place], [place] [address]   Fungus Culture With Stain     Status: None   Collection Time: 06/19/18  4:08 PM   Specimen: Paracentesis; Peritoneal Fluid  Result Value Ref Range Status   Fungus Stain Final report  Final    Comment: (NOTE) Performed At: [facility] [place], [place] [id number] Rush Farmer MD IO:[id number]    Fungus (Mycology) Culture PENDING  Incomplete   Fungal Source PERITONEAL  Corrected    Comment: Performed at [facility], [place] [address], [place], [place] [address] CORRECTED ON 06/12 AT 1636: PREVIOUSLY REPORTED AS PARACENTESIS   Fungus Culture Result     Status: None   Collection Time: 06/19/18  4:08 PM  Result Value Ref Range Status   Result 1 Comment  Final    Comment: (NOTE) KOH/Calcofluor preparation:  no fungus observed. Performed At: [facility] [place], [place] [id number] Rush Farmer MD GX:[id number]      Labs: BNP (last 3 results) No  results for input(s): BNP in the last 8760 hours. Basic Metabolic Panel: Recent Labs  Lab 06/20/18 0347  06/21/18 0553 06/22/18 0514 06/23/18 0310 06/24/18 0507 06/25/18 0447 06/26/18 0415  NA 139   < >  --  135 135 135 137 134*  K 3.4*   < >  --  3.9 4.3 4.4 3.6 4.7  CL 113*   < >  --  109 107 107 108 104  CO2 22   < >  --  22 21* 22 22 22   GLUCOSE 63*   < >  --  109* 127* 149* 109* 112*  BUN 13   < >  --  12 16 20 18 22   CREATININE 0.48*   < >  --  0.56* 0.43* 0.47* 0.43* 0.62  CALCIUM 7.9*   < >  --  7.8* 8.1* 8.2* 8.3* 8.0*  MG 1.6*  --  2.0 1.8  --   --   --   --   PHOS  --   --   --  1.9* 2.5  --   --   --    < > = values in this interval not displayed.   Liver Function Tests: Recent Labs  Lab 06/20/18 0347 06/23/18 0310 06/24/18 0507 06/25/18 0447  AST 49* 48* 43* 43*  ALT 23 25 27 29   ALKPHOS 224* 255* 245* 279*  BILITOT 5.9* 8.4* 5.4* 4.2*  PROT 6.5 5.9* 5.7* 6.0*  ALBUMIN 2.8* 2.4* 2.2* 2.4*   No results for input(s): LIPASE, AMYLASE in the last 168 hours. Recent Labs  Lab 06/25/18 0520  AMMONIA 65*   CBC: Recent Labs  Lab 06/20/18 0347 06/21/18 0553 06/22/18 0514 06/22/18 2131 06/25/18 0447 06/26/18 0415  WBC 10.7* 12.1* 10.8* 14.0* 8.8 13.7*  NEUTROABS 8.8*  --   --   --    --  11.3*  HGB 8.0* 7.5* 7.0* 8.2* 8.5* 7.1*  HCT 25.5* 23.8* 22.7* 25.6* 27.0* 22.5*  MCV 89.8 91.2 90.8 90.1 89.4 90.4  PLT 169 130* 139* 171 175 129*   Cardiac Enzymes: No results for input(s): CKTOTAL, CKMB, CKMBINDEX, TROPONINI in the last 168 hours. BNP: Invalid input(s): POCBNP CBG: Recent Labs  Lab 06/25/18 1348 06/25/18 1625 06/25/18 2130 06/26/18 0726 06/26/18 1138  GLUCAP 79 75 82 94 112*   D-Dimer No results for input(s): DDIMER in the last 72 hours. Hgb A1c No results for input(s): HGBA1C in the last 72 hours. Lipid Profile No results for input(s): CHOL, HDL, LDLCALC, TRIG, CHOLHDL, LDLDIRECT in the last 72 hours. Thyroid function studies No results for input(s): TSH, T4TOTAL, T3FREE, THYROIDAB in the last 72 hours.  Invalid input(s): FREET3 Anemia work up No results for input(s): VITAMINB12, FOLATE, FERRITIN, TIBC, IRON, RETICCTPCT in the last 72 hours. Urinalysis    Component Value Date/Time   COLORURINE AMBER (A) 06/19/2018 0440   APPEARANCEUR HAZY (A) 06/19/2018 0440   LABSPEC 1.015 06/19/2018 0440   PHURINE 5.0 06/19/2018 0440   GLUCOSEU NEGATIVE 06/19/2018 0440   HGBUR NEGATIVE 06/19/2018 0440   BILIRUBINUR NEGATIVE 06/19/2018 0440   KETONESUR NEGATIVE 06/19/2018 0440   PROTEINUR NEGATIVE 06/19/2018 0440   UROBILINOGEN 0.2 08/15/2011 1816   NITRITE NEGATIVE 06/19/2018 0440   LEUKOCYTESUR NEGATIVE 06/19/2018 0440   Sepsis Labs Invalid input(s): PROCALCITONIN,  WBC,  LACTICIDVEN Microbiology Recent Results (from the past 240 hour(s))  Gram stain     Status: None   Collection Time: 06/19/18  2:56 AM   Specimen: Fluid  Result Value Ref Range Status   Specimen Description FLUID  Final   Special Requests NONE  Final   Gram Stain   Final    WBC PRESENT, PREDOMINANTLY MONONUCLEAR NO ORGANISMS SEEN CYTOSPIN SMEAR Performed at [facility], [address], [place], [place] [address]    Report Status 06/22/2018 FINAL  Final  Culture, body  fluid-bottle     Status: None   Collection Time: 06/19/18  2:56 AM   Specimen: Fluid  Result Value Ref Range Status   Specimen Description FLUID  Final   Special Requests NONE  Final   Culture   Final    NO GROWTH 5 DAYS Performed at [facility] [address] [place], [place] [address]    Report Status 06/24/2018 FINAL  Final  Culture, blood (Routine x 2)     Status: None   Collection Time: 06/19/18  3:30 AM   Specimen: BLOOD  Result Value Ref Range Status   Specimen Description   Final    BLOOD BLOOD LEFT HAND Performed at [facility] [address] [place], [place] [address]    Special Requests   Final    BOTTLES DRAWN AEROBIC ONLY Blood Culture adequate volume Performed at [facility] [address] [place], [place] [address]    Culture   Final    NO GROWTH 5 DAYS Performed at [facility], [place] [address], [place], [place] [address]    Report Status 06/24/2018 FINAL  Final  SARS Coronavirus 2 (CEPHEID- Performed in [facility] lab), Hosp Order     Status: None   Collection Time: 06/19/18  3:30 AM   Specimen: Nasopharyngeal Swab  Result Value Ref Range Status   SARS Coronavirus 2 NEGATIVE NEGATIVE Final    Comment: (NOTE) If result is NEGATIVE SARS-CoV-2 target nucleic acids are NOT DETECTED. The SARS-CoV-2 RNA is generally detectable in upper and lower  respiratory specimens during the acute phase of infection. The lowest  concentration of SARS-CoV-2 viral copies this assay can detect is 250  copies / mL. A negative result does not preclude SARS-CoV-2 infection  and should not be used as the sole basis for treatment or other  patient management decisions.  A negative result may occur with  improper specimen collection / handling, submission of specimen other  than nasopharyngeal swab, presence of viral mutation(s) within the  areas targeted by this assay, and inadequate number of viral copies  (<250  copies / mL). A negative result must be combined with clinical  observations, patient history, and epidemiological information. If result is POSITIVE SARS-CoV-2 target nucleic acids are DETECTED. The SARS-CoV-2 RNA is generally detectable in upper and lower  respiratory specimens dur ing the acute phase of infection.  Positive  results are indicative of active infection with SARS-CoV-2.  Clinical  correlation with patient history and other diagnostic information is  necessary to determine patient infection status.  Positive results do  not rule out bacterial infection or co-infection with other viruses. If result is PRESUMPTIVE POSTIVE SARS-CoV-2 nucleic acids MAY BE PRESENT.   A presumptive positive result was obtained on the submitted specimen  and confirmed on repeat testing.  While 2019 novel coronavirus  (SARS-CoV-2) nucleic acids may be present in the submitted sample  additional confirmatory testing may be necessary for epidemiological  and / or clinical management purposes  to differentiate between  SARS-CoV-2 and other Sarbecovirus currently known to infect humans.  If  clinically indicated additional testing with an alternate test  methodology [phone]) is advised. The SARS-CoV-2 RNA is generally  detectable in upper and lower respiratory sp ecimens during the acute  phase of infection. The expected result is Negative. Fact Sheet for Patients:  StrictlyIdeas.no Fact Sheet for Healthcare Providers: BankingDealers.co.za This test is not yet approved or cleared by the [place] FDA and has been authorized for detection and/or diagnosis of SARS-CoV-2 by FDA under an Emergency Use Authorization (EUA).  This EUA will remain in effect (meaning this test can be used) for the duration of the COVID-19 declaration under Section 564(b)(1) of the Act, 21 U.S.C. section 360bbb-3(b)(1), unless the authorization is terminated or revoked  sooner. Performed at [facility], [place] [address], [place], [place] [address]   Culture, blood (Routine x 2)     Status: None   Collection Time: 06/19/18  3:38 AM   Specimen: BLOOD  Result Value Ref Range Status   Specimen Description   Final    BLOOD BLOOD LEFT ARM Performed at [facility] [address], [place], [place] [address]    Special Requests   Final    BOTTLES DRAWN AEROBIC AND ANAEROBIC Blood Culture adequate volume Performed at [facility] [address] [place], [place] [address]    Culture   Final    NO GROWTH 5 DAYS Performed at [facility], [place] [address], [place], [place] [address]    Report Status 06/24/2018 FINAL  Final  Urine culture     Status: None   Collection Time: 06/19/18  4:40 AM   Specimen: Urine, Catheterized  Result Value Ref Range Status   Specimen Description   Final    URINE, CATHETERIZED Performed at [facility] [address], [place], [place] [address]    Special Requests   Final    Immunocompromised Performed at [facility], [place] [address] [place], [place] [address]    Culture   Final    NO GROWTH Performed at [facility], [place] [address] [place], [place] [address]    Report Status 06/20/2018 FINAL  Final  MRSA PCR Screening     Status: None   Collection Time: 06/19/18  7:00 AM   Specimen: Nasopharyngeal  Result Value Ref Range Status   MRSA by PCR NEGATIVE NEGATIVE Final    Comment:        The GeneXpert MRSA Assay (FDA approved for NASAL specimens only), is one component of a comprehensive MRSA colonization surveillance program. It is not intended to diagnose MRSA infection nor to guide or monitor treatment for MRSA infections. Performed at [facility], [place] [address], [place], [place] [address]   Fungus Culture With Stain     Status: None   Collection Time: 06/19/18  4:08 PM    Specimen: Paracentesis; Peritoneal Fluid  Result Value Ref Range Status   Fungus Stain Final report  Final    Comment: (NOTE) Performed At: [facility] [place], [place] [id number] Rush Farmer MD TD:[id number]    Fungus (Mycology) Culture PENDING  Incomplete   Fungal Source PERITONEAL  Corrected    Comment: Performed at [facility], [place] [address] [place], [place] [address] CORRECTED ON 06/12 AT 1636: PREVIOUSLY REPORTED AS PARACENTESIS   Fungus Culture Result     Status: None   Collection Time: 06/19/18  4:08 PM  Result Value Ref Range Status  Result 1 Comment  Final    Comment: (NOTE) KOH/Calcofluor preparation:  no fungus observed. Performed At: [facility], [place] [id number] Rush Farmer MD JU:[id number]      Time coordinating discharge: 45 minutes  SIGNED:   Tawni Millers, MD  [facility] 06/26/2018, 12:40 PM

## 2018-06-26 NOTE — Consult Note (Signed)
   [facility] CM Inpatient Consult   06/26/2018  KEKAI GETER 09/21/1955 [id number]   Patient chart reviewed for unplanned readmission risk score of 46%, extreme, and multiple hospitalizations. Patient is member in [place] under health insurance plan.  Chart review reveals disposition plan is for home with hospice. No [facility] CM identifiable needs.  Netta Cedars, MSN, [facility] Liaison Nurse Mobile Phone [phone]  Toll free office [phone]

## 2018-06-26 NOTE — Progress Notes (Signed)
Daily Progress Note   Patient Name: Matthew Lewis       Date: 06/26/2018 DOB: September 21, 1955  Age: 63 y.o. MRN#: [id number] Attending Physician: Tawni Millers Primary Care Physician: [name], MD Admit Date: 06/19/2018  Reason for Consultation/Follow-up: Establishing goals of care  Subjective: I saw and examined Matthew Lewis today.    Reports being tired and "just want to be home."  Discussed plan for home with hospice and important being at home is to him.  We also discussed possible transition to residential hospice as his condition worsens and care needs become higher.  I spoke with his wife about this yesterday as well and I shared this with him.  Length of Stay: 7  Current Medications: Scheduled Meds:  . Chlorhexidine Gluconate Cloth  6 each Topical Daily  . DULoxetine  30 mg Oral BID  . fludrocortisone  0.1 mg Oral Daily  . gabapentin  400 mg Oral Daily  . gabapentin  800 mg Oral QHS  . hydrocortisone sod succinate (SOLU-CORTEF) inj  50 mg Intravenous Daily  . insulin aspart  0-9 Units Subcutaneous TID WC  . insulin detemir  10 Units Subcutaneous QHS  . insulin detemir  20 Units Subcutaneous Daily  . mouth rinse  15 mL Mouth Rinse BID  . midodrine  10 mg Oral TID WC  . pantoprazole (PROTONIX) IV  40 mg Intravenous Q12H  . sodium chloride flush  10-40 mL Intracatheter Q12H    Continuous Infusions: . sodium chloride Stopped (06/22/18 2027)    PRN Meds: sodium chloride, acetaminophen **OR** acetaminophen, iohexol, lidocaine, morphine injection, ondansetron **OR** ondansetron (ZOFRAN) IV, sodium chloride flush  Physical Exam  General: Alert, awake, in no acute distress.  HEENT: No bruits, no goiter, no JVD Heart: Regular rate and rhythm. No murmur  appreciated. Lungs: Fair air movement, clear Abdomen: Soft, distended Ext: No significant edema Skin: Warm and dry Neuro: Grossly intact, nonfocal.          Vital Signs: BP 112/73 (BP Location: Right Arm)   Pulse 100   Temp 97.8 F (36.6 C) (Oral)   Resp 17   Ht 5\' 11"  (1.803 m)   Wt 102.9 kg   SpO2 100%   BMI 31.64 kg/m  SpO2: SpO2: 100 % O2 Device: O2 Device: Room Air O2 Flow Rate:  O2 Flow Rate (L/min): 2 L/min  Intake/output summary:   Intake/Output Summary (Last 24 hours) at 06/26/2018 1304 Last data filed at 06/26/2018 1012 Gross per 24 hour  Intake 580 ml  Output 800 ml  Net -220 ml   LBM: Last BM Date: 06/23/18 Baseline Weight: Weight: 93.9 kg Most recent weight: Weight: 102.9 kg       Palliative Assessment/Data:    Flowsheet Rows     Most Recent Value  Intake Tab  Referral Department  Hospitalist  Unit at Time of Referral  Intermediate Care Unit  Palliative Care Primary Diagnosis  Cancer  Palliative Care Type  Return patient Palliative Care  Reason for referral  Clarify Goals of Care  Date first seen by Palliative Care  06/22/18  Clinical Assessment  Palliative Performance Scale Score  30%  Pain Max last 24 hours  5  Pain Min Last 24 hours  4  Psychosocial & Spiritual Assessment  Palliative Care Outcomes      Patient Active Problem List   Diagnosis Date Noted  . Malignant ascites   . Secondary esophageal varices without bleeding ([place])   . Idiopathic esophageal varices with bleeding (HCC)   . SIRS (systemic inflammatory response syndrome) ([place]) 05/12/2018  . Macrocytic anemia 05/12/2018  . Syncope and collapse 05/12/2018  . Hypovolemic shock ([place])   . Upper GI bleed 02/24/2018  . Lactic acidosis 02/24/2018  . Acute blood loss anemia 02/24/2018  . Port-A-Cath in place 01/28/2018  . Dehydration 12/22/2017  . Bladder outlet obstruction 12/22/2017  . Constipation 12/22/2017  . Bladder outflow obstruction   . Sepsis due to gram-negative  bacteria ([place])   . Severe sepsis with acute organ dysfunction due to Gram negative bacteria ([place]) 12/03/2017  . Septic shock ([place])   . Cholangiocarcinoma ([place])   . Hypotension 11/20/2017  . Acute renal failure (ARF) ([place]) 11/20/2017  . Palliative care by specialist   . Segmental colitis ([place]) 11/09/2017  . Elevated lipase 11/09/2017  . Metastatic adenocarcinoma to liver ([place]) 11/06/2017  . Goals of care, counseling/discussion 11/06/2017  . Cirrhosis ([place])   . Malignant neoplasm metastatic to adrenal gland ([place])   . Biliary obstruction due to malignant neoplasm ([place]) 10/23/2017  . Transaminitis 10/23/2017  . Liver lesion 10/23/2017  . History of colon cancer 10/23/2017  . Perirectal abscess 02/24/2017  . Moderate protein-calorie malnutrition ([place]) 02/24/2017  . Sepsis ([place]) 02/24/2017  . Pulmonary nodule 02/24/2017  . Alcoholic cirrhosis of liver without ascites ([place]) 02/24/2017  . Duodenitis 06/13/2016  . Hyponatremia 06/12/2016  . Nausea & vomiting 06/12/2016  . Thrombocytopenia ([place]) 06/12/2016  . Acute gastroenteritis 06/12/2016  . PTSD (post-traumatic stress disorder) 12/02/2012  . Hx of substance abuse- clean several years now 12/02/2012  . Chest pain with moderate risk of acute coronary syndrome 11/30/2012  . Obesity 11/30/2012  . Hypertension 11/30/2012  . Hyperlipemia 11/30/2012  . Type II diabetes mellitus ([place]) 11/30/2012  . CAD (coronary artery disease)- PCI 2001- no evaluation since 11/30/2012    Palliative Care Assessment & Plan   Patient Profile: 63 year old  gentleman with metastatic cholangiocarcinoma admitted 6/12 following a fall.  Concern for sepsis and GI bleed.  Recommendations/Plan:  Plan for home with hospice through [facility] today.  Discussed home hospice and possible need to consider transition to residential hospice based on clinical course once he is home.   We discussed end of life and emotional struggle with this.  Supportive listening  provided.  Goals of Care and Additional Recommendations:  Code Status:    Code Status Orders  (From admission, onward)         Start     Ordered   06/19/18 0714  Limited resuscitation (code)  Continuous    Comments: Patient ok with Vasopressors.  Question Answer Comment  In the event of cardiac or respiratory ARREST: Initiate Code Blue, Call Rapid Response No   In the event of cardiac or respiratory ARREST: Perform CPR No   In the event of cardiac or respiratory ARREST: Perform Intubation/Mechanical Ventilation No   In the event of cardiac or respiratory ARREST: Use NIPPV/BiPAp only if indicated No   In the event of cardiac or respiratory ARREST: Administer ACLS medications if indicated No   In the event of cardiac or respiratory ARREST: Perform Defibrillation or Cardioversion if indicated No   Comments I verified DNR status by direct conversation with patient who was alert and oriented x 3      06/19/18 0713        Code Status History    Date Active Date Inactive Code Status Order ID Comments User Context   06/19/2018 0654 06/19/2018 0712 DNR [id number]  Rise Patience, MD Inpatient   05/12/2018 2222 05/17/2018 1524 DNR [id number]  Frederik Pear, MD Inpatient   05/12/2018 1212 05/12/2018 2222 Full Code [id number]  Eugenie Filler, MD Inpatient   05/12/2018 0451 05/12/2018 1212 DNR [id number]  Vianne Bulls, MD ED   02/24/2018 1025 02/27/2018 1524 Full Code [id number]  Reubin Milan, MD ED   02/24/2018 0752 02/24/2018 1025 DNR [id number]  Reubin Milan, MD ED   12/22/2017 1644 12/30/2017 1856 DNR [id number]  Eugenie Filler, MD ED   12/03/2017 1153 12/11/2017 1659 DNR [id number]  Desiree Hane, MD Inpatient   11/21/2017 1605 11/24/2017 1856 DNR [id number]  Alma Friendly, MD Inpatient   11/20/2017 1931 11/21/2017 1605 Full Code [id number]  Yaakov Guthrie, MD ED   11/09/2017 0733 11/11/2017 1844 DNR [id number]  Toy Baker, MD ED   10/23/2017 2256 10/29/2017  1951 Full Code [id number]  Etta Quill, DO ED   02/24/2017 1951 02/25/2017 1725 DNR [id number]  Karmen Bongo, MD Inpatient   06/12/2016 2021 06/14/2016 2149 Full Code [id number]  Vianne Bulls, MD ED   11/30/2012 2030 12/02/2012 2057 Full Code [id number]  Louellen Molder, MD Inpatient   08/15/2011 1827 08/19/2011 1821 Full Code [id number]  Kathalene Frames, MD ED   06/05/2011 1927 06/08/2011 1410 Full Code [id number]  Whitaker, Marilynn Rail, RN Inpatient   Advance Care Planning Activity    Advance Directive Documentation     Most Recent Value  Type of Advance Directive  Healthcare Power of Attorney, Living will  Pre-existing out of facility DNR order (yellow form or pink MOST form)  -  "MOST" Form in Place?  -       Prognosis:   < 6 months- Likely weeks at best  Discharge Planning: Home with Hospice   Care plan was discussed with patient, RN  Thank you for allowing the Palliative Medicine Team to assist in the care of this patient.   Time In: 1240 Time Out: 1310 Total Time 30 Prolonged Time Billed No      Greater than 50%  of this time was spent counseling and coordinating care related to the above assessment and plan.  Micheline Rough, MD  Please contact Palliative Medicine Team phone at [phone] for questions and concerns.

## 2018-06-26 NOTE — Progress Notes (Signed)
Manufacturing engineer [facility]) Hospice  DME was ordered through [facility] at 10am this morning.  ACC has spoken with [employer] several times attempting to determine the cause of delay.  Spoke with [employer] @ 630p, they advised it will be delivered by 8pm.  Instructed wife to call the unit as soon as it is delivered so transport can be arranged.  If any further issues arise with DME, please call [employer] [facility] at [phone]  Thank you, Venia Carbon RN, BSN, [facility] Liaison  [phone]

## 2018-06-26 NOTE — Progress Notes (Addendum)
Manufacturing engineer [facility]) Hospice  Spoke with Palliative Medical Team about hospice services at home once discharged.  Attempted to engage wife several times without a response.  Patient is eligible for hospice services per [facility] physician.  Will attempt to reach out again to determine potential DME needs.  We have tentatively saved a RN admission visit for Saturday, should we be able to reach family and confirm interest.  Thank you, Venia Carbon RN, BSN, [place] (in [place] under [facility]) [phone] (main #)  **6/19 9am, attempted all #s on face sheet again

## 2018-06-29 ENCOUNTER — Other Ambulatory Visit: Payer: Self-pay | Admitting: *Deleted

## 2018-06-29 ENCOUNTER — Telehealth: Payer: Self-pay | Admitting: *Deleted

## 2018-06-29 DIAGNOSIS — C787 Secondary malignant neoplasm of liver and intrahepatic bile duct: Secondary | ICD-10-CM

## 2018-06-29 NOTE — Telephone Encounter (Signed)
[place] Melnick, RN [facility] called reporting that pt was admitted to Hospice services over the weekend post hospitalization.   Cristela Blue will see pt today.  However, wife had called nurse x 2 asking for therapeutic paracentesis due to fluids building up fast.   Pt is currently taking MS IR 7.5 mg PRN.  Cristela Blue stated she will assess pt and give [name] updates on pt. Maura's     Phone     [phone].

## 2018-06-29 NOTE — Telephone Encounter (Signed)
Spoke with wife Matthew Lewis, and was informed that pt will need paracentesis soon.  Wife also requested to have Pleurx catheter insertion for drainage to be done at home.  [name] notified. Orders placed for therapeutic paracentesis at 130 pm on Tues  06/30/18.   Tiffany in IR will schedule percutaneous drainage catheter and will contact [name] with appts. Spoke with Matthew Lewis and informed her of above.  Matthew Lewis voiced understanding. Matthew Lewis's    Phone     [phone].

## 2018-06-30 ENCOUNTER — Other Ambulatory Visit: Payer: Self-pay | Admitting: *Deleted

## 2018-06-30 ENCOUNTER — Inpatient Hospital Stay (HOSPITAL_COMMUNITY): Admission: RE | Admit: 2018-06-30 | Payer: Medicare Other | Source: Ambulatory Visit

## 2018-06-30 ENCOUNTER — Telehealth: Payer: Self-pay | Admitting: *Deleted

## 2018-06-30 ENCOUNTER — Ambulatory Visit (HOSPITAL_COMMUNITY)
Admission: RE | Admit: 2018-06-30 | Discharge: 2018-06-30 | Disposition: A | Discharge status: Home / Self Care | Payer: Medicare Other | Source: Ambulatory Visit | Attending: Hematology | Admitting: Hematology

## 2018-06-30 DIAGNOSIS — C787 Secondary malignant neoplasm of liver and intrahepatic bile duct: Secondary | ICD-10-CM

## 2018-06-30 NOTE — Telephone Encounter (Signed)
Received call from [place] in  [facility] radiology re:  Matthew Lewis spoke with hospice nurse Maura about pt's appt, and was asked to inform [name]  That pt passed away today  07/03/2018  At   118 pm.

## 2018-06-30 NOTE — Telephone Encounter (Signed)
Spoke with [place] Melnick, RN  [facility], and was informed that pt has PICC catheter and PAC - both still accessed from post hospitalization.  Nothing going through both ports.  Maura wanted to know if PICC catheter can be d/c while pt is in radiology today for paracentesis.  Per Cristela Blue, pt is almost non mobile, very weak. Cristela Blue also stated pt has biliary catheter which is capped at present due to bleeding at site - serosangenous with light blood noted.  Wife had to change ABD pads around site x 2 yesterday. [name] and Regan Rakers, NP notified.  OK to d/c PICC line per both providers.  Spoke with Myriam Jacobson, [place] radiology and informed her of order.  Order placed for PICC removal. Pt is scheduled for Pleurx catheter insertion on Tues  07/07/18. Informed Maura, RN to initiate Hospice standing orders to help keep pt comfortable as possible. Maura voiced understanding. Maura's   Phone     [phone].

## 2018-07-01 NOTE — Telephone Encounter (Signed)
Thanks for letting us know. Regan Rakers

## 2018-07-03 ENCOUNTER — Other Ambulatory Visit (HOSPITAL_COMMUNITY): Payer: Medicare Other

## 2018-07-07 ENCOUNTER — Other Ambulatory Visit (HOSPITAL_COMMUNITY): Payer: Medicare Other

## 2018-07-08 DEATH — deceased

## 2018-07-17 LAB — FUNGAL ORGANISM REFLEX

## 2018-07-17 LAB — FUNGUS CULTURE WITH STAIN

## 2018-07-17 LAB — FUNGUS CULTURE RESULT

## 2018-08-20 ENCOUNTER — Other Ambulatory Visit (HOSPITAL_COMMUNITY): Payer: Medicare Other

## 2019-04-16 IMAGING — XA IR FLUORO GUIDE CV LINE*L*
1 series · 1 of 1 positions shown · non-contrast
Comparison: none

CLINICAL DATA: Cholangiocarcinoma and need for porta cath for
chemotherapy.

[Series 300: line placements · 1 of 1 slices shown]
[im 1/1]
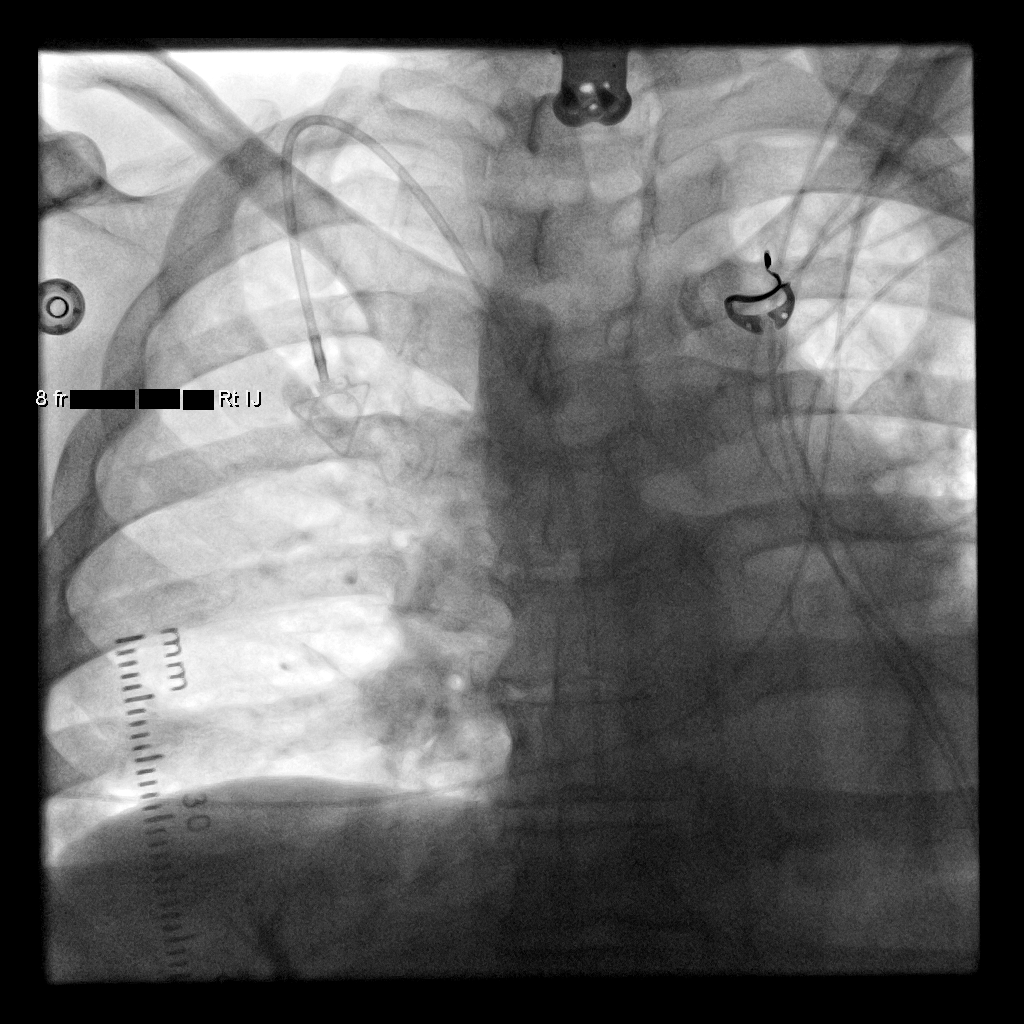

[1 of 1 positions shown; findings below may reference images not displayed]

EXAM:
IMPLANTED PORT A CATH PLACEMENT WITH ULTRASOUND AND FLUOROSCOPIC
GUIDANCE

ANESTHESIA/SEDATION:
3.0 mg IV Versed; 100 mcg IV Fentanyl

Total Moderate Sedation Time:  37 minutes

The patient's level of consciousness and physiologic status were
continuously monitored during the procedure by Radiology nursing.

Additional Medications: A scheduled dose 1 g IV meropenem with given
just prior to the procedure..

FLUOROSCOPY TIME:  42 seconds.  16.7 mGy.

PROCEDURE:
The procedure, risks, benefits, and alternatives were explained to
the patient. Questions regarding the procedure were encouraged and
answered. The patient understands and consents to the procedure. A
time-out was performed prior to initiating the procedure.

Ultrasound was utilized to confirm patency of the right internal
jugular vein. The right neck and chest were prepped with
chlorhexidine in a sterile fashion, and a sterile drape was applied
covering the operative field. Maximum barrier sterile technique with
sterile gowns and gloves were used for the procedure. Local
anesthesia was provided with 1% lidocaine.

After creating a small venotomy incision, a 21 gauge needle was
advanced into the right internal jugular vein under direct,
real-time ultrasound guidance. Ultrasound image documentation was
performed. After securing guidewire access, an 8 Fr dilator was
placed. A J-wire was kinked to measure appropriate catheter length.

A subcutaneous port pocket was then created along the upper chest
wall utilizing sharp and blunt dissection. Portable cautery was
utilized. The pocket was irrigated with sterile saline.

A single lumen power injectable port was chosen for placement. The 8
Fr catheter was tunneled from the port pocket site to the venotomy
incision. The port was placed in the pocket. External catheter was
trimmed to appropriate length based on guidewire measurement.

At the venotomy, an 8 Fr peel-away sheath was placed over a
guidewire. The catheter was then placed through the sheath and the
sheath removed. Final catheter positioning was confirmed and
documented with a fluoroscopic spot image. The port was accessed
with a needle and aspirated and flushed with heparinized saline. The
access needle was removed.

The venotomy and port pocket incisions were closed with subcutaneous
3-0 Monocryl and subcuticular 4-0 Vicryl. Dermabond was applied to
both incisions.

COMPLICATIONS:
COMPLICATIONS
None
FINDINGS: After catheter placement, the tip lies at the Lasvecars Modidima junction.
The catheter aspirates normally and is ready for immediate use.
IMPRESSION: Placement of single lumen port a cath via right internal jugular
vein. The catheter tip lies at the Lasvecars Modidima junction. A power
injectable port a cath was placed and is ready for immediate use.

## 2019-04-16 IMAGING — XA IR EXCHANGE BILARY DRAIN
3 series · 12 of 14 positions shown · non-contrast
Comparison: none

INDICATION: Cholangiocarcinoma of liver, primarily within the left lobe causing
biliary obstruction. Status post biliary drain placement from a
right lobe approach on 10/26/2017. Admitted with sepsis and now
adequately treated for bacteremia. Assessment of bile ducts
performed to determine need for internal biliary stenting and
whether the external drain can be removed.

[Series 1: fl - angio · 3 of 104 frames shown (1 of 2)]
[frame 5/104]
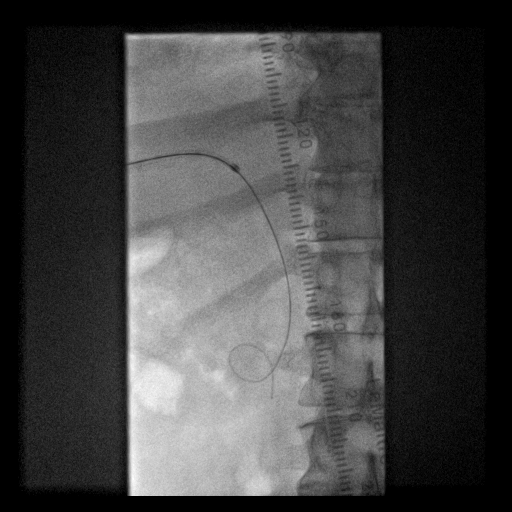
[frame 16/104]
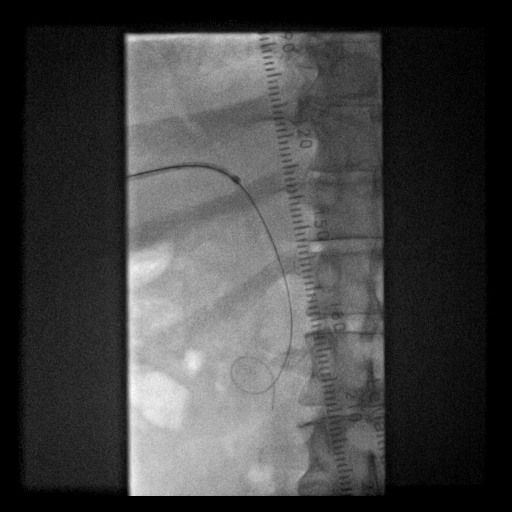
[frame 53/104]
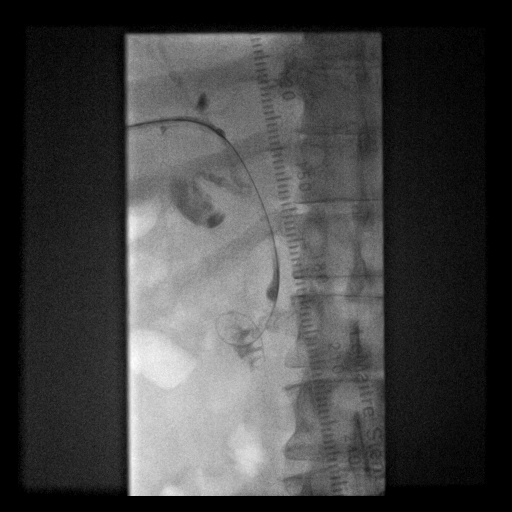

[Series 2: fl - angio · 4 of 33 frames shown (2 of 2)]
[frame 4/33]
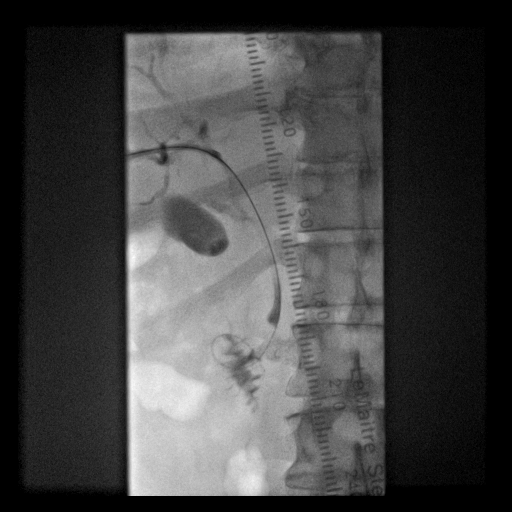
[frame 5/33]
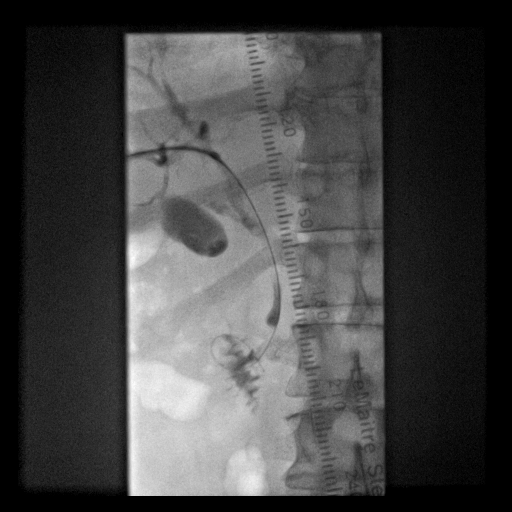
[frame 17/33]
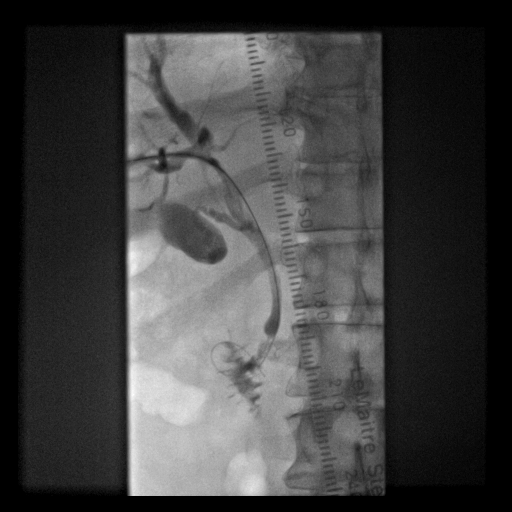
[frame 29/33]
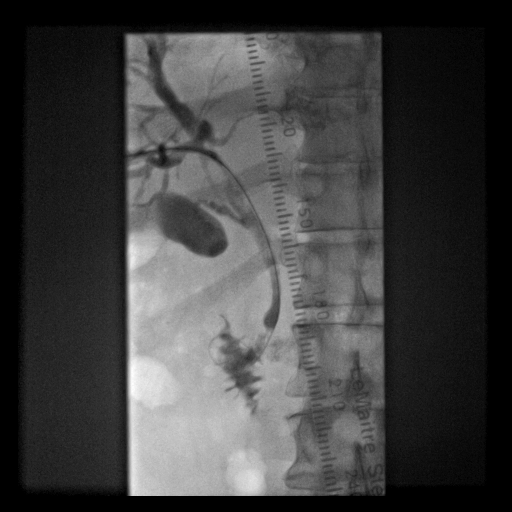

[Series 300: ir biliary stent(s) existing access inc  · 5 of 6 slices shown]
[im 1/6]
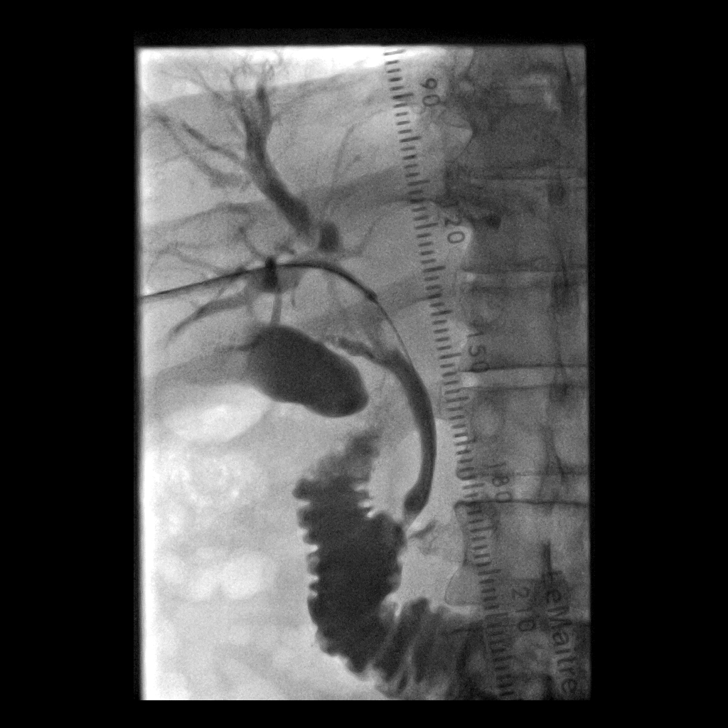
[im 2/6]
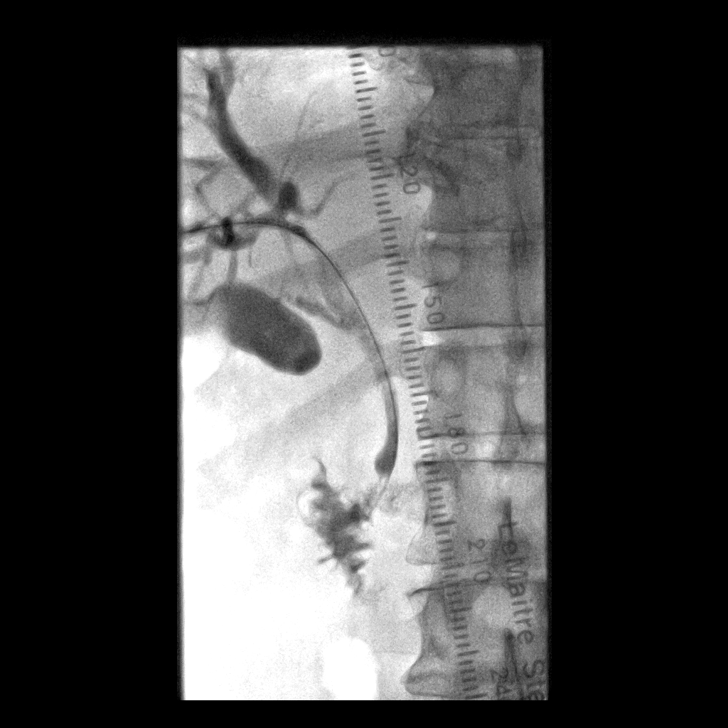
[im 4/6]
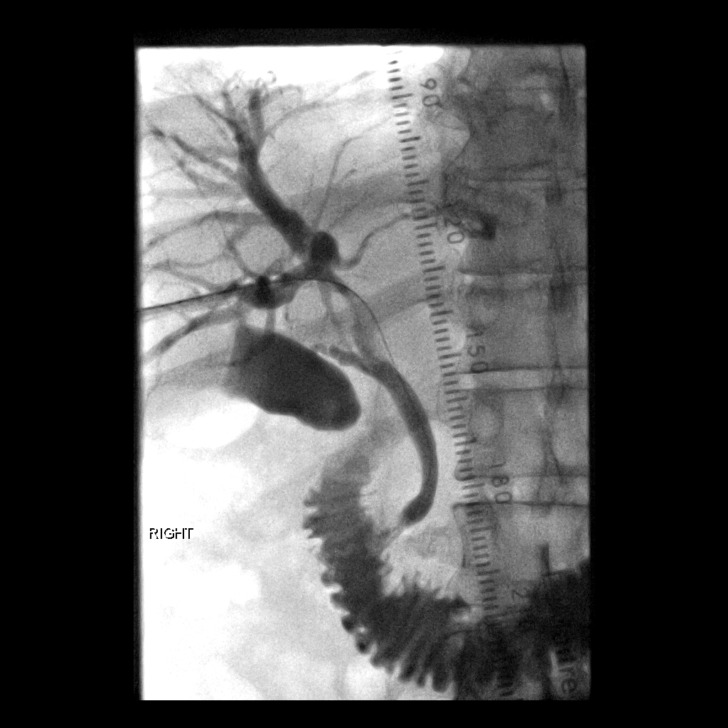
[im 5/6]
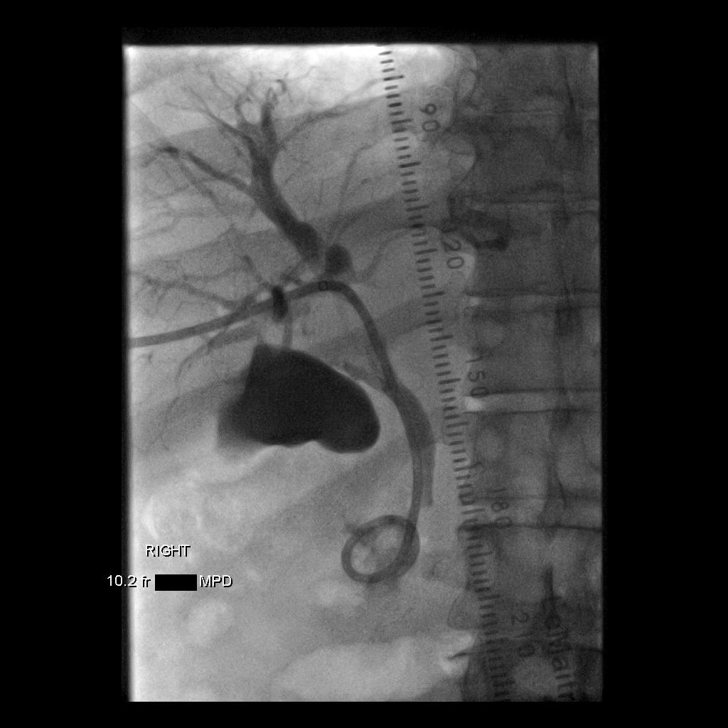
[im 6/6]
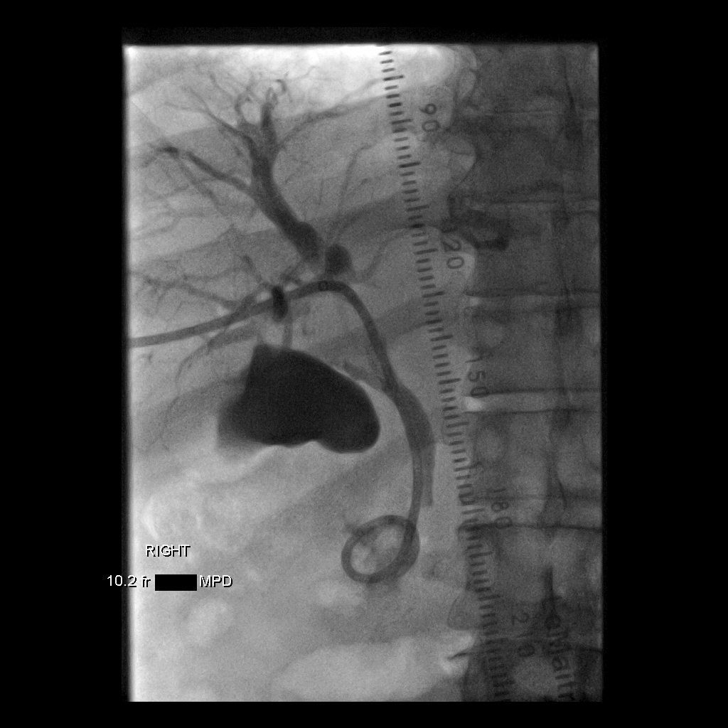

[12 of 14 positions shown; findings below may reference images not displayed]

EXAM:
EXCHANGE OF BILIARY DRAINAGE CATHETER WITH CHOLANGIOGRAM

MEDICATIONS:
1 g IV meropenem; The antibiotic was administered within an
appropriate time frame prior to the initiation of the procedure.

ANESTHESIA/SEDATION:
Moderate (conscious) sedation was employed during this procedure. A
total of Versed 3.0 mg and Fentanyl 100 mcg was administered
intravenously.

Moderate Sedation Time: 23 minutes. The patient's level of
consciousness and vital signs were monitored continuously by
radiology nursing throughout the procedure under my direct
supervision.

FLUOROSCOPY TIME:  Fluoroscopy Time: 3 minutes and 18 seconds.
mGy.

COMPLICATIONS:
None immediate.

PROCEDURE:
Informed written consent was obtained from the patient after a
thorough discussion of the procedural risks, benefits and
alternatives. All questions were addressed. Maximal Sterile Barrier
Technique was utilized including caps, mask, sterile gowns, sterile
gloves, sterile drape, hand hygiene and skin antiseptic. A timeout
was performed prior to the initiation of the procedure.

Initially, the indwelling 10 French biliary drainage catheter was
removed over a guidewire. A 7 French sheath was advanced into the
right lobe of the liver and a cholangiogram performed with
visualization of biliary drainage in the right lobe as well as
extrahepatic bile ducts. The sheath positioning was altered and a 5
French catheter also advanced parallel to the guidewire in
attempting opacification of left lobe bile ducts.

A new 10 French internal/external biliary drainage catheter was then
advanced over a guidewire. The distal portion was formed in the
duodenum. Catheter positioning was confirmed by fluoroscopy after
contrast injection. The drainage catheter was flushed with saline
and capped. It was secured at the skin with a Prolene retention
suture.
FINDINGS: Initial cholangiogram shows fairly good drainage throughout the
right lobe bile ducts and extrahepatic bile ducts without evidence
of significant obstruction. There is some mild narrowing of central
right intrahepatic ducts near the confluence with left lobe ducts.
The common bile duct is normally patent. The cystic duct is patent
with reflux of contrast noted into the gallbladder. Contrast enters
the duodenum normally.

Left lobe ducts appear isolated and likely obstructed centrally with
no ability to opacified left lobe bile ducts by right-sided
cholangiogram. Injection of a 5 French catheter introduced into
central bile ducts could not opacify left lobe ducts.

It was evident that there is currently not a need for right-sided
permanent indwelling biliary stenting. In addition, placement of a
central right-sided biliary stent would completely isolate any
existing drainage of left-sided bile ducts. For this reason, the
biliary drain was exchanged and will be left capped for a trial of
internal drainage. Should bilirubin levels rise, there may be a need
for separate percutaneous drainage of left-sided bile ducts at a
different setting.
IMPRESSION: 1. Cholangiogram shows good drainage throughout the right lobe bile
ducts and extrahepatic bile ducts without evidence of significant
obstruction. There is some degree of narrowing of central right
intrahepatic bile ducts near the biliary confluence with left lobe
ducts. However, there was not enough obstruction present to warrant
internal stenting and stenting would also isolate left lobe bile
ducts. The current right-sided internal/external biliary drainage
catheter was exchanged for a new 10 French catheter. This will be
initially capped for a trial of internal drainage.
2. Should bilirubin levels rise, there may be a need for an
additional separate percutaneous drainage procedure of left lobe
bile ducts.

## 2019-10-31 IMAGING — XA IR EXCHANGE BILARY DRAIN
1 series · 5 of 5 positions shown · non-contrast
Comparison: none

INDICATION: History of cholangiocarcinoma with prior placement of both right
lobe and left lobe internal/external percutaneous biliary drainage
catheters. The right-sided catheter has visibly retracted further
out of the exit site.

[Series 300: tube placements · 5 of 5 slices shown]
[im 1/5]
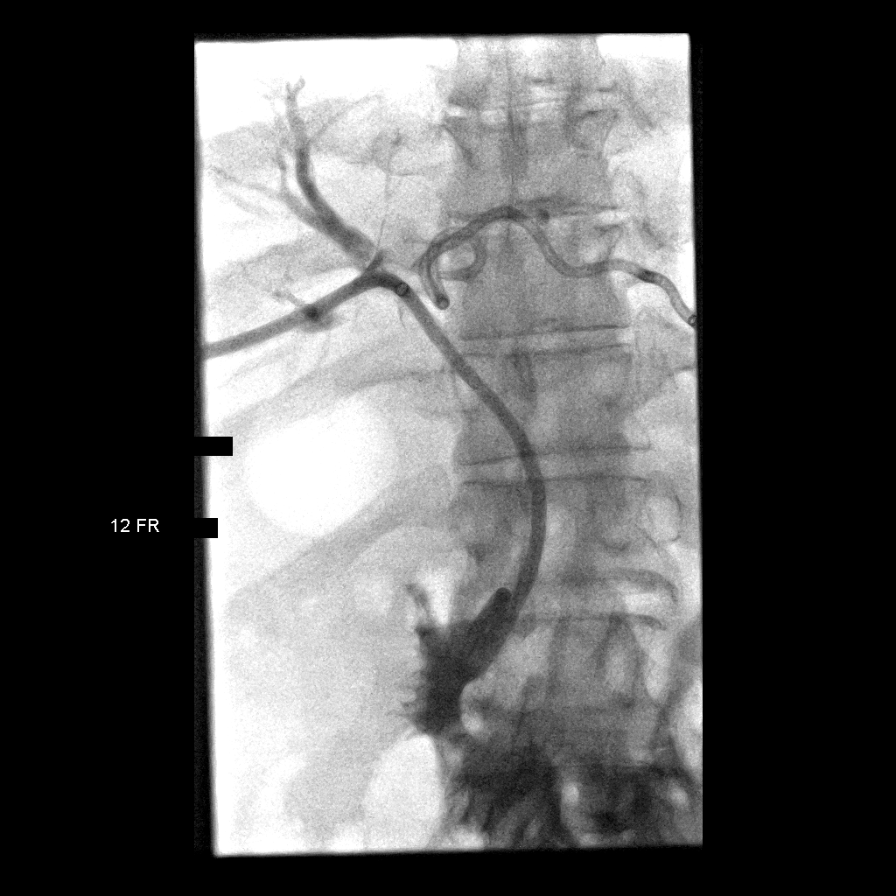
[im 2/5]
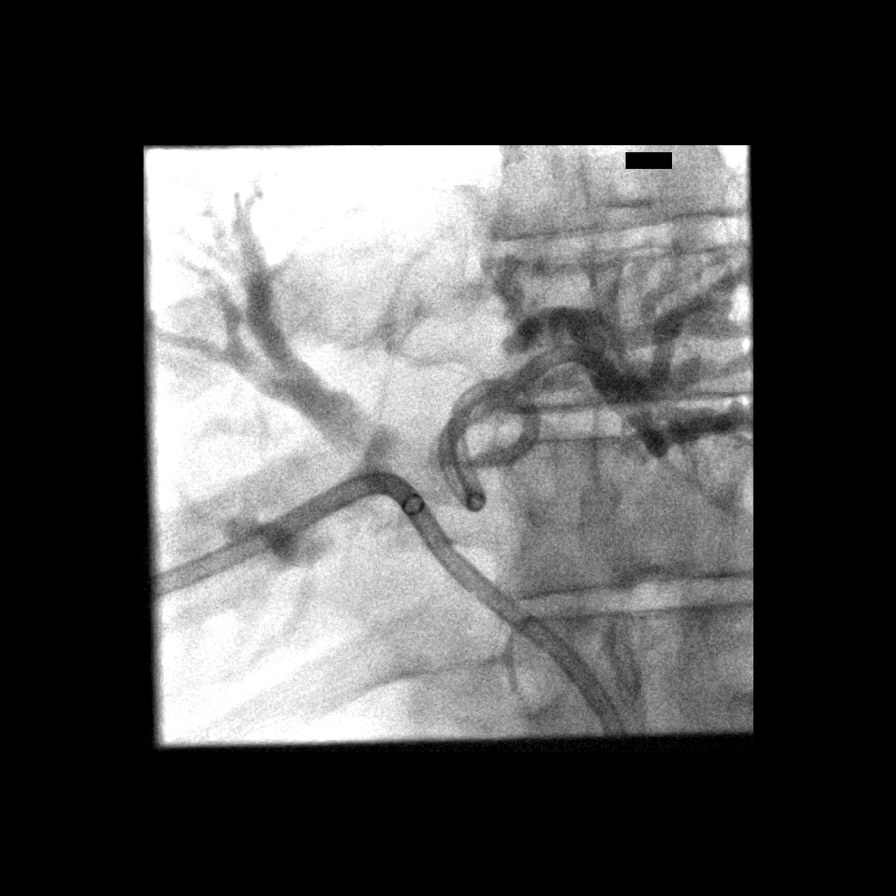
[im 3/5]
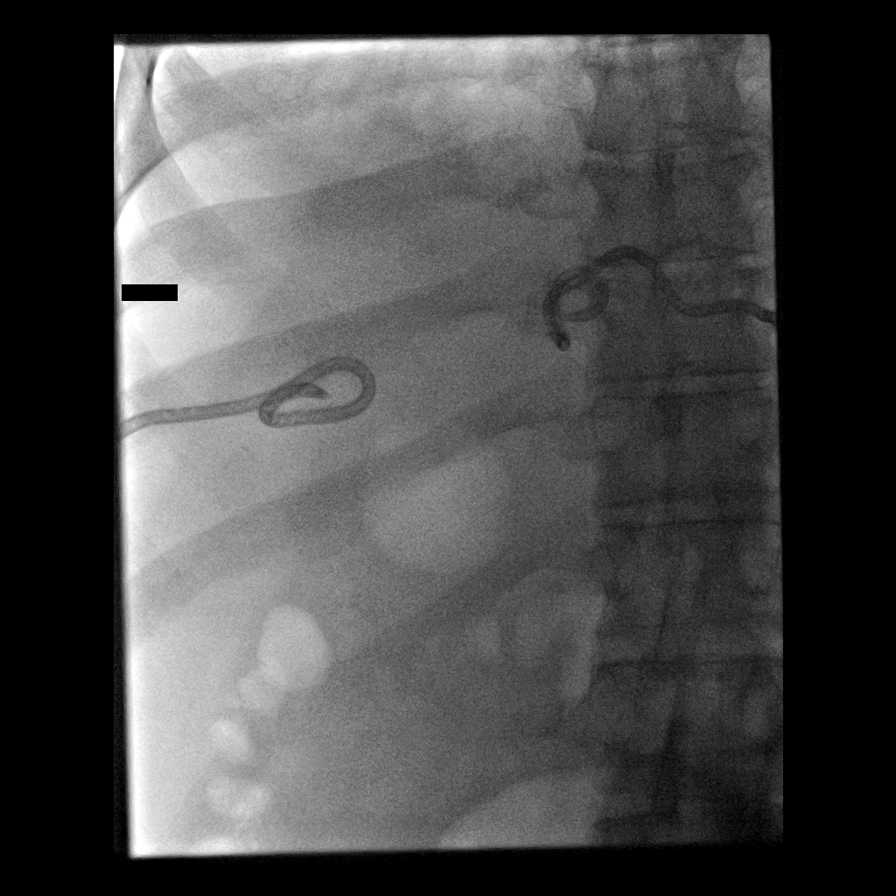
[im 4/5]
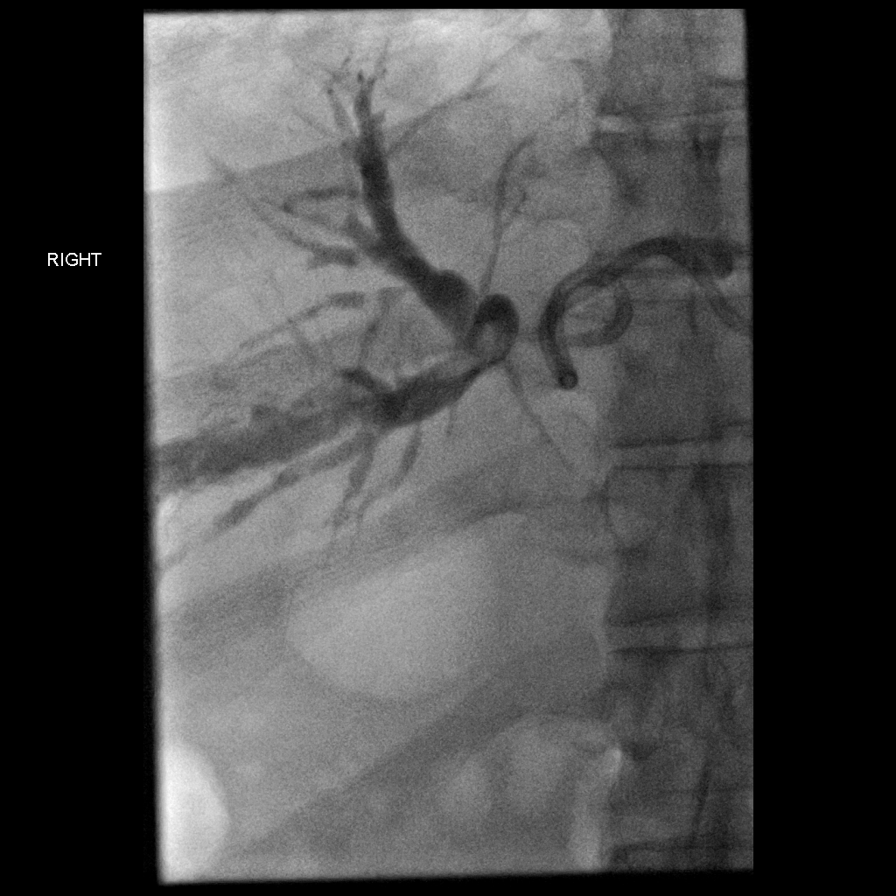
[im 5/5]
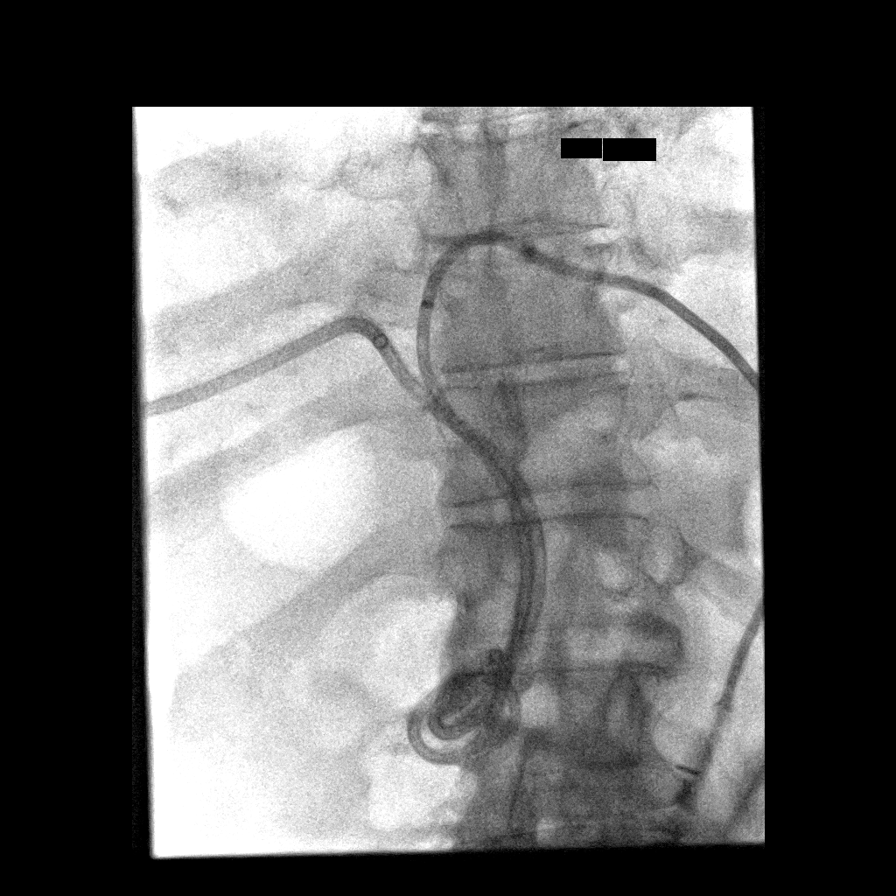

[5 of 5 positions shown; findings below may reference images not displayed]

EXAM:
1. EXCHANGE OF RIGHT INTERNAL/EXTERNAL BILIARY DRAINAGE CATHETER
2. EXCHANGE OF LEFT INTERNAL/EXTERNAL BILIARY DRAINAGE CATHETER

MEDICATIONS:
None

ANESTHESIA/SEDATION:
None

FLUOROSCOPY TIME:  Fluoroscopy Time: 7 minutes and 12 seconds. 348
mGy.

CONTRAST:  25 mL Omnipaque 300

COMPLICATIONS:
None immediate.

PROCEDURE:
Informed written consent was obtained from the patient after a
thorough discussion of the procedural risks, benefits and
alternatives. All questions were addressed. Maximal Sterile Barrier
Technique was utilized including caps, mask, sterile gowns, sterile
gloves, sterile drape, hand hygiene and skin antiseptic. A timeout
was performed prior to the initiation of the procedure.

Fluoroscopy was performed the biliary drainage catheters. The
catheters were prepped with Betadine and draped. The right biliary
drainage catheter was injected with contrast material. The catheter
was then cut and removed over a guidewire. A 5 French Kumpe catheter
was placed over the guidewire and advanced into the biliary tree.
The catheter was further advanced down the common bile duct and into
the duodenum over a hydrophilic guidewire. A new 12 French
internal/external biliary drainage catheter was then advanced. The
distal portion of the catheter was formed in the duodenum. The
catheter was injected with contrast and a fluoroscopic spot image
saved. The catheter was flushed and connected to a gravity drainage
bag.

The left sided biliary drainage catheter was then injected with
contrast. It was cut and removed over a guidewire. Utilizing a 5
French catheter, guidewire access was then obtained down the common
bile duct and into the duodenum. Over the guidewire, a new 10 French
internal/external biliary drainage catheter was advanced. The distal
portion of the catheter was formed in the duodenum. The catheter was
injected with contrast and a fluoroscopic image saved. The catheter
was flushed and connected to a gravity drainage bag.

Bilateral biliary tube output was then assessed and decision made to
cap both drains. Pressure dressings were applied at the catheter
exit sites after applying StatLock retention devices.
FINDINGS: Initial fluoroscopy shows significant retraction of both
internal/external biliary drainage catheters which are both
retracted significantly into the liver parenchyma. Contrast
injection of the right biliary drainage catheter confirms
intraparenchymal positioning within a peripheral bile duct in the
liver parenchyma. After removing the right biliary drainage
catheter, guidewire access was re-established down the common bile
duct and into the duodenum successfully allowing placement of a new
internal/external biliary drainage catheter that was formed in the
duodenum. There was return minimally blood tinged bile after
successful replacement of the right-sided biliary drainage catheter.

The left biliary drainage catheter was also in an intraparenchymal
position within the left lobe near the confluence with the right
lobe. After removing the drain over a guidewire, there was fairly
brisk bleeding noted from the catheter exit site at the skin.
Etiology of bleeding was not visible with contrast injection of the
drain. After advancing access and placing a new left-sided biliary
drain to the level of the duodenum, there was note made bloody
output from both the right and left biliary drains. For this reason,
a decision was made to cap the biliary drains for now to limit blood
loss and try to tamponade bleeding.

Based on the CT of 06/19/2018, there is visible tumor invading the
left portal venous system and main portal vein at the level of the
porta hepatis with thrombus in the portal vein. Bleeding today is
felt to most likely be originating from tumor eroding into the
portal vein and central left biliary tree. Arterial bleeding in this
location cannot be entirely excluded. However, a discrete arterial
pseudoaneurysm is not visible on the recent CT study.
IMPRESSION: Both internal/external biliary drainage catheters have retracted
into the liver parenchyma. Both catheters were able to be exchanged
and advanced centrally through the common bile duct and formed in
the duodenum. During catheter exchange, there was brisk bleeding
noted from the left lobe biliary drain catheter exit site. After the
left lobe drain was exchange, blood return was also noted from the
right biliary drainage catheter. Presume source of bleeding is tumor
erosion into the central liver and porta hepatis with tumor likely
invading the portal vein by CT. Given hemobilia from the biliary
drains, both drains were capped to try to tamponade bleeding.

## 2022-11-28 NOTE — Telephone Encounter (Signed)
Telephone call
